# Patient Record
Sex: Male | Born: 1955 | Race: White | Hispanic: No | Marital: Married | Smoking: Never smoker
Health system: Southern US, Community
[De-identification: ages and names within clinical notes are randomized; demographics above are authoritative.]

## PROBLEM LIST (undated history)

## (undated) DIAGNOSIS — G839 Paralytic syndrome, unspecified: Secondary | ICD-10-CM

## (undated) DIAGNOSIS — I639 Cerebral infarction, unspecified: Secondary | ICD-10-CM

## (undated) DIAGNOSIS — T7840XA Allergy, unspecified, initial encounter: Secondary | ICD-10-CM

## (undated) DIAGNOSIS — E079 Disorder of thyroid, unspecified: Secondary | ICD-10-CM

## (undated) DIAGNOSIS — N200 Calculus of kidney: Secondary | ICD-10-CM

## (undated) DIAGNOSIS — J189 Pneumonia, unspecified organism: Secondary | ICD-10-CM

## (undated) DIAGNOSIS — I10 Essential (primary) hypertension: Secondary | ICD-10-CM

## (undated) DIAGNOSIS — E039 Hypothyroidism, unspecified: Secondary | ICD-10-CM

## (undated) DIAGNOSIS — Z87442 Personal history of urinary calculi: Secondary | ICD-10-CM

## (undated) DIAGNOSIS — C801 Malignant (primary) neoplasm, unspecified: Secondary | ICD-10-CM

## (undated) DIAGNOSIS — M1990 Unspecified osteoarthritis, unspecified site: Secondary | ICD-10-CM

## (undated) DIAGNOSIS — I509 Heart failure, unspecified: Secondary | ICD-10-CM

## (undated) DIAGNOSIS — K219 Gastro-esophageal reflux disease without esophagitis: Secondary | ICD-10-CM

## (undated) HISTORY — DX: Essential (primary) hypertension: I10

## (undated) HISTORY — DX: Gastro-esophageal reflux disease without esophagitis: K21.9

## (undated) HISTORY — PX: TONSILLECTOMY: SUR1361

## (undated) HISTORY — DX: Unspecified osteoarthritis, unspecified site: M19.90

## (undated) HISTORY — DX: Disorder of thyroid, unspecified: E07.9

## (undated) HISTORY — DX: Allergy, unspecified, initial encounter: T78.40XA

## (undated) HISTORY — PX: REVERSE SHOULDER ARTHROPLASTY: SHX5054

---
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
## 2000-03-27 HISTORY — PX: CARDIAC CATHETERIZATION: SHX172

## 2000-04-23 ENCOUNTER — Ambulatory Visit (HOSPITAL_COMMUNITY): Admission: RE | Admit: 2000-04-23 | Discharge: 2000-04-23 | Payer: Self-pay | Admitting: *Deleted

## 2004-02-07 ENCOUNTER — Ambulatory Visit: Payer: Self-pay | Admitting: Internal Medicine

## 2004-02-13 ENCOUNTER — Ambulatory Visit: Payer: Self-pay | Admitting: Professional

## 2004-02-27 ENCOUNTER — Ambulatory Visit: Payer: Self-pay | Admitting: Professional

## 2004-03-05 ENCOUNTER — Ambulatory Visit: Payer: Self-pay | Admitting: Professional

## 2004-03-11 ENCOUNTER — Ambulatory Visit: Payer: Self-pay | Admitting: Internal Medicine

## 2004-03-19 ENCOUNTER — Ambulatory Visit: Payer: Self-pay | Admitting: Professional

## 2004-06-13 ENCOUNTER — Ambulatory Visit: Payer: Self-pay | Admitting: Internal Medicine

## 2004-09-16 ENCOUNTER — Ambulatory Visit: Payer: Self-pay | Admitting: Internal Medicine

## 2004-11-06 ENCOUNTER — Ambulatory Visit: Payer: Self-pay | Admitting: Internal Medicine

## 2005-01-01 ENCOUNTER — Encounter: Payer: Self-pay | Admitting: Internal Medicine

## 2005-01-21 ENCOUNTER — Ambulatory Visit: Payer: Self-pay | Admitting: Professional

## 2005-01-28 ENCOUNTER — Ambulatory Visit: Payer: Self-pay | Admitting: Professional

## 2005-02-04 ENCOUNTER — Ambulatory Visit: Payer: Self-pay | Admitting: Professional

## 2005-02-11 ENCOUNTER — Ambulatory Visit: Payer: Self-pay | Admitting: Professional

## 2005-02-25 ENCOUNTER — Ambulatory Visit: Payer: Self-pay | Admitting: Professional

## 2005-03-04 ENCOUNTER — Ambulatory Visit: Payer: Self-pay | Admitting: Professional

## 2005-03-12 ENCOUNTER — Ambulatory Visit: Payer: Self-pay | Admitting: Internal Medicine

## 2005-04-01 ENCOUNTER — Ambulatory Visit: Payer: Self-pay | Admitting: Professional

## 2005-09-09 ENCOUNTER — Ambulatory Visit: Payer: Self-pay | Admitting: Internal Medicine

## 2005-11-21 ENCOUNTER — Ambulatory Visit: Payer: Self-pay | Admitting: Internal Medicine

## 2006-03-26 ENCOUNTER — Ambulatory Visit: Payer: Self-pay | Admitting: Internal Medicine

## 2006-09-17 DIAGNOSIS — I10 Essential (primary) hypertension: Secondary | ICD-10-CM | POA: Insufficient documentation

## 2006-09-17 DIAGNOSIS — K219 Gastro-esophageal reflux disease without esophagitis: Secondary | ICD-10-CM | POA: Insufficient documentation

## 2006-09-17 DIAGNOSIS — J309 Allergic rhinitis, unspecified: Secondary | ICD-10-CM | POA: Insufficient documentation

## 2006-09-24 ENCOUNTER — Ambulatory Visit: Payer: Self-pay | Admitting: Internal Medicine

## 2007-02-11 ENCOUNTER — Telehealth (INDEPENDENT_AMBULATORY_CARE_PROVIDER_SITE_OTHER): Payer: Self-pay | Admitting: *Deleted

## 2007-02-23 ENCOUNTER — Telehealth: Payer: Self-pay | Admitting: Family Medicine

## 2007-02-24 ENCOUNTER — Encounter: Payer: Self-pay | Admitting: Family Medicine

## 2007-02-26 ENCOUNTER — Encounter (INDEPENDENT_AMBULATORY_CARE_PROVIDER_SITE_OTHER): Payer: Self-pay | Admitting: Internal Medicine

## 2007-02-26 ENCOUNTER — Ambulatory Visit: Payer: Self-pay | Admitting: Family Medicine

## 2007-02-26 DIAGNOSIS — M545 Low back pain, unspecified: Secondary | ICD-10-CM | POA: Insufficient documentation

## 2007-03-03 ENCOUNTER — Encounter (INDEPENDENT_AMBULATORY_CARE_PROVIDER_SITE_OTHER): Payer: Self-pay | Admitting: Internal Medicine

## 2007-03-15 ENCOUNTER — Telehealth (INDEPENDENT_AMBULATORY_CARE_PROVIDER_SITE_OTHER): Payer: Self-pay | Admitting: Internal Medicine

## 2007-04-08 ENCOUNTER — Ambulatory Visit: Payer: Self-pay | Admitting: Internal Medicine

## 2007-04-09 ENCOUNTER — Encounter: Payer: Self-pay | Admitting: Internal Medicine

## 2007-04-12 ENCOUNTER — Encounter: Payer: Self-pay | Admitting: Internal Medicine

## 2007-04-12 ENCOUNTER — Ambulatory Visit: Payer: Self-pay | Admitting: Family Medicine

## 2007-04-13 ENCOUNTER — Ambulatory Visit: Payer: Self-pay | Admitting: Internal Medicine

## 2007-04-14 ENCOUNTER — Encounter: Payer: Self-pay | Admitting: Internal Medicine

## 2007-04-28 ENCOUNTER — Ambulatory Visit: Payer: Self-pay | Admitting: Endocrinology

## 2007-05-05 ENCOUNTER — Encounter: Admission: RE | Admit: 2007-05-05 | Discharge: 2007-05-05 | Payer: Self-pay | Admitting: Endocrinology

## 2007-05-12 ENCOUNTER — Telehealth (INDEPENDENT_AMBULATORY_CARE_PROVIDER_SITE_OTHER): Payer: Self-pay | Admitting: *Deleted

## 2007-05-18 ENCOUNTER — Telehealth (INDEPENDENT_AMBULATORY_CARE_PROVIDER_SITE_OTHER): Payer: Self-pay | Admitting: *Deleted

## 2007-05-19 ENCOUNTER — Encounter: Payer: Self-pay | Admitting: Endocrinology

## 2007-05-24 ENCOUNTER — Encounter: Admission: RE | Admit: 2007-05-24 | Discharge: 2007-05-24 | Payer: Self-pay | Admitting: Endocrinology

## 2007-05-25 ENCOUNTER — Encounter: Payer: Self-pay | Admitting: Endocrinology

## 2007-05-25 ENCOUNTER — Telehealth (INDEPENDENT_AMBULATORY_CARE_PROVIDER_SITE_OTHER): Payer: Self-pay | Admitting: *Deleted

## 2007-06-21 ENCOUNTER — Telehealth (INDEPENDENT_AMBULATORY_CARE_PROVIDER_SITE_OTHER): Payer: Self-pay | Admitting: Internal Medicine

## 2007-07-05 ENCOUNTER — Ambulatory Visit: Payer: Self-pay | Admitting: Internal Medicine

## 2007-10-12 ENCOUNTER — Ambulatory Visit: Payer: Self-pay | Admitting: Internal Medicine

## 2007-10-14 ENCOUNTER — Encounter: Payer: Self-pay | Admitting: Internal Medicine

## 2008-04-17 ENCOUNTER — Ambulatory Visit: Payer: Self-pay | Admitting: Internal Medicine

## 2008-04-18 ENCOUNTER — Encounter: Payer: Self-pay | Admitting: Internal Medicine

## 2008-05-05 ENCOUNTER — Ambulatory Visit: Payer: Self-pay | Admitting: Gastroenterology

## 2008-06-02 ENCOUNTER — Ambulatory Visit: Payer: Self-pay | Admitting: Gastroenterology

## 2008-06-19 ENCOUNTER — Ambulatory Visit: Payer: Self-pay | Admitting: Internal Medicine

## 2008-07-03 ENCOUNTER — Telehealth: Payer: Self-pay | Admitting: Internal Medicine

## 2008-09-22 ENCOUNTER — Ambulatory Visit: Payer: Self-pay

## 2008-12-13 ENCOUNTER — Ambulatory Visit: Payer: Self-pay

## 2008-12-19 ENCOUNTER — Ambulatory Visit: Payer: Self-pay | Admitting: Internal Medicine

## 2008-12-19 DIAGNOSIS — M1990 Unspecified osteoarthritis, unspecified site: Secondary | ICD-10-CM | POA: Insufficient documentation

## 2009-02-24 HISTORY — PX: TOTAL HIP ARTHROPLASTY: SHX124

## 2009-02-26 ENCOUNTER — Telehealth: Payer: Self-pay | Admitting: Family Medicine

## 2009-03-05 ENCOUNTER — Encounter: Payer: Self-pay | Admitting: Internal Medicine

## 2009-03-05 ENCOUNTER — Ambulatory Visit: Payer: Self-pay | Admitting: Unknown Physician Specialty

## 2009-03-09 ENCOUNTER — Telehealth: Payer: Self-pay | Admitting: Internal Medicine

## 2009-03-09 ENCOUNTER — Ambulatory Visit: Payer: Self-pay | Admitting: Internal Medicine

## 2009-03-09 DIAGNOSIS — J984 Other disorders of lung: Secondary | ICD-10-CM | POA: Insufficient documentation

## 2009-03-12 ENCOUNTER — Telehealth: Payer: Self-pay | Admitting: Internal Medicine

## 2009-03-12 ENCOUNTER — Encounter: Payer: Self-pay | Admitting: Internal Medicine

## 2009-03-12 ENCOUNTER — Ambulatory Visit: Payer: Self-pay | Admitting: Cardiology

## 2009-03-13 ENCOUNTER — Inpatient Hospital Stay: Payer: Self-pay | Admitting: Unknown Physician Specialty

## 2009-04-03 ENCOUNTER — Telehealth: Payer: Self-pay | Admitting: Internal Medicine

## 2009-06-18 ENCOUNTER — Ambulatory Visit: Payer: Self-pay | Admitting: Internal Medicine

## 2009-06-19 ENCOUNTER — Ambulatory Visit: Payer: Self-pay | Admitting: Internal Medicine

## 2009-06-19 ENCOUNTER — Encounter: Payer: Self-pay | Admitting: Internal Medicine

## 2009-06-19 DIAGNOSIS — E039 Hypothyroidism, unspecified: Secondary | ICD-10-CM | POA: Insufficient documentation

## 2009-06-22 ENCOUNTER — Telehealth: Payer: Self-pay | Admitting: Internal Medicine

## 2009-07-20 ENCOUNTER — Telehealth (INDEPENDENT_AMBULATORY_CARE_PROVIDER_SITE_OTHER): Payer: Self-pay | Admitting: *Deleted

## 2009-08-20 ENCOUNTER — Ambulatory Visit: Payer: Self-pay | Admitting: Internal Medicine

## 2009-08-20 DIAGNOSIS — M25559 Pain in unspecified hip: Secondary | ICD-10-CM | POA: Insufficient documentation

## 2009-09-13 ENCOUNTER — Ambulatory Visit: Payer: Self-pay | Admitting: Family Medicine

## 2009-09-13 DIAGNOSIS — M76899 Other specified enthesopathies of unspecified lower limb, excluding foot: Secondary | ICD-10-CM | POA: Insufficient documentation

## 2009-10-15 ENCOUNTER — Ambulatory Visit: Payer: Self-pay | Admitting: Internal Medicine

## 2009-10-15 DIAGNOSIS — R252 Cramp and spasm: Secondary | ICD-10-CM | POA: Insufficient documentation

## 2009-10-21 ENCOUNTER — Encounter: Payer: Self-pay | Admitting: Internal Medicine

## 2009-12-21 ENCOUNTER — Ambulatory Visit: Payer: Self-pay | Admitting: Internal Medicine

## 2010-03-26 NOTE — Assessment & Plan Note (Signed)
Summary: CPX/DLO   Vital Signs:  Patient profile:   55 year old male Weight:      213 pounds BMI:     32.50 Temp:     98.7 degrees F oral Pulse rate:   76 / minute Pulse rhythm:   regular BP sitting:   160 / 100  (left arm) Cuff size:   large  Vitals Entered By: Mervin Hack CMA Duncan Dull) (June 18, 2009 11:46 AM) CC: adult physical   History of Present Illness: Had right THR in January Went well Back to work last week Very little pain--slightly into back Also has some arthritic changes in back  Recommended to take calcium with vitamin D--through Dr Gerrit Heck discussed that multivitamin would be adequate for him does spend time in sun also  Getting repeat DEXA REasonable to recheck due to the hyperthyroidism If normal again, will not need additional checks  wonders about aspriin for prmary prevention still on mobic for now--may be trying to wean off over time  Allergies: No Known Drug Allergies  Past History:  Past medical, surgical, family and social histories (including risk factors) reviewed for relevance to current acute and chronic problems.  Past Medical History: Reviewed history from 12/19/2008 and no changes required. Allergic rhinitis GERD Hypertension Hyperthyroidism---RAI Rx 3/09 Osteoarthritis  Past Surgical History: Tonsillectomy [facility] Cardiac Cath- negative 02/02 Right THR-- 1/11   Dr Gerrit Heck  Family History: Reviewed history from 04/17/2008 and no changes required. Dad has gout Mom with arrhythmia, HTN No CAD or DM GF died of lung cancer (smoker) no throid disease  Social History: Reviewed history from 09/24/2006 and no changes required. 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 Eyes:  Denies double vision and vision loss-1 eye. ENT:  Denies decreased hearing and  ringing in ears; teeth okay--regular with dentist. CV:  Denies chest pain or discomfort, difficulty breathing at night, difficulty breathing while lying down, fainting, lightheadness, palpitations, and shortness of breath with exertion. Resp:  Denies cough and shortness of breath. GI:  Complains of indigestion; denies abdominal pain, bloody stools, change in bowel habits, dark tarry stools, nausea, and vomiting; minor heartburn. GU:  Denies erectile dysfunction, urinary frequency, and urinary hesitancy. MS:  See HPI; needs right big toe checked--had something fall on it last summer Nail looks like it may be ready to fall off now. Derm:  Complains of lesion(s); denies rash; has some cherry angiomas some brown marks on back that wife has noted--no change. Neuro:  Denies headaches, numbness, tingling, and weakness. Psych:  Denies anxiety and depression. Heme:  Denies abnormal bruising and enlarge lymph nodes. Allergy:  Complains of seasonal allergies and sneezing; okay with OTC meds.  Physical Exam  General:  alert and normal appearance.   Eyes:  pupils equal, pupils round, pupils reactive to light, and no optic disk abnormalities.   Ears:  R ear normal and L ear normal.   Mouth:  no erythema and no lesions.   Neck:  supple, no masses, no thyromegaly, no carotid bruits, and no cervical lymphadenopathy.   Lungs:  normal respiratory effort and normal breath sounds.   Heart:  normal rate, regular rhythm, no murmur, and no gallop.   Abdomen:  soft, non-tender, and no masses.   Rectal:  no hemorrhoids and no masses.   Prostate:  no gland enlargement and no nodules.  Msk:  no joint tenderness and no joint swelling.   Pulses:  1+ in feet Extremities:  no edema Neurologic:  alert & oriented X3, strength normal in all extremities, and gait normal.   Skin:  no rashes and no suspicious lesions.   Axillary Nodes:  No palpable lymphadenopathy Psych:  normally interactive, good eye contact, not  anxious appearing, and not depressed appearing.     Impression & Recommendations:  Problem # 1:  PREVENTIVE HEALTH CARE (ICD-V70.0) Assessment Comment Only due for PSA increasing activity now since hip replacement  Problem # 2:  HYPERTENSION (ICD-401.9) Assessment: Comment Only  control not okay now will try off the mobic and see back may need to add or increase meds  His updated medication list for this problem includes:    Lisinopril-hydrochlorothiazide 20-25 Mg Tabs (Lisinopril-hydrochlorothiazide) .Marland Kitchen... 1 daily  BP today: 160/100 Prior BP: 160/100 (03/09/2009)  Orders: Venipuncture (53664)  Problem # 3:  OSTEOARTHRITIS (ICD-715.90) Assessment: Comment Only will try tylenol now due to increased BP  The following medications were removed from the medication list:    Mobic 7.5 Mg Tabs (Meloxicam) .Marland Kitchen... Take 1 by mouth two times a day    Vicodin 5-500 Mg Tabs (Hydrocodone-acetaminophen) .Marland Kitchen... Pt not sure of strength  Problem # 4:  HYPERTHYROIDISM (ICD-242.90) Assessment: Comment Only had Rx will check labs  Complete Medication List: 1)  Lisinopril-hydrochlorothiazide 20-25 Mg Tabs (Lisinopril-hydrochlorothiazide) .Marland Kitchen.. 1 daily 2)  Ranitidine Hcl 75 Mg Tabs (Ranitidine hcl) .... Take one by mouth two times a day as needed 3)  Cyclobenzaprine Hcl 10 Mg Tabs (Cyclobenzaprine hcl) .... 1/2 -1 two times a day as needed for muscle spasm 4)  Loradamed 10 Mg Tabs (Loratadine) .... Take 1 by mouth once daily 5)  Vitamin C Cr 1000 Mg Cr-tabs (Ascorbic acid) .... Take 1 by mouth once daily 6)  Fiberall Powd (Fiber-vitamins-minerals) .... Once daily 7)  Multivitamins Tabs (Multiple vitamin) .... Take one by mouth once a day  Patient Instructions: 1)  Please stop the mobic 2)  Okay to use tylenol as needed for your back pain 3)  Blood work to [facility] 4)    CBC with diff, urine microal, met C--401.9 5)     TSH, free T4-- 242.90 6)      lipid-- V70.0 7)      PSA--V76.44 8)   Please schedule a follow-up appointment in 1-2  month.   Current Allergies (reviewed today): No known allergies   Appended Document: CPX/DLO

## 2010-03-26 NOTE — Progress Notes (Signed)
Summary: leg is swollen  Phone Note Call from Patient   Caller: Patient Call For: Cindee Salt MD Summary of Call: Pt has swelling in right lower leg and ankle.  Has a splotchy rash on lower leg.  No pain, no itch.  He doesnt understand the sxs, doesnt know why he has the swelling.  Doesnt remember being stung or bitten by anything.  Offered appt today but he declined, says he has to work.  Advised saturday clinic if not better or if worse tomorrow.  He said he would watch and see how he does.  He is using ice and elevates when he is at home. Initial call taken by: Lowella Petties CMA,  June 22, 2009 8:43 AM  Follow-up for Phone Call        noted Please check on him Monday AM Could still add on today if worsens Follow-up by: Cindee Salt MD,  June 22, 2009 8:56 AM  Additional Follow-up for Phone Call Additional follow up Details #1::        left message on machine at home for patient to return my call.  DeShannon Smith CMA Duncan Dull)  Jun 25, 2009 1:23 PM   Spoke with pt, he says he used benedryl cream and rash and swelling went away. Additional Follow-up by: Lowella Petties CMA,  Jun 26, 2009 10:39 AM

## 2010-03-26 NOTE — Progress Notes (Signed)
Summary: Need surgical clearance after CT of chest done today. surgery   Phone Note From Other Clinic Call back at [phone] ext [phone]   Caller: Tonya at Dr. Golda Acre office  Call For: Dr Alphonsus Sias Summary of Call: Tonya with Dr. Golda Acre office just called to see if we have reviewed the CT of chest for pt that was done at 3pm today.. This will determine if has clearance for surgery in AM 01/11/10 at 8:30am. Please advise.  Initial call taken by: Lewanda Rife LPN,  March 12, 2009 5:01 PM  Follow-up for Phone Call        cleared fax the report from CT to them and clearance paper Follow-up by: Cindee Salt MD,  March 12, 2009 5:08 PM  Additional Follow-up for Phone Call Additional follow up Details #1::        Completed clearance paper and CT faxed to Dr. Golda Acre office [phone] and Archie Patten with Dr. Golda Acre office notified.Lewanda Rife LPN  March 12, 2009 5:13 PM

## 2010-03-26 NOTE — Assessment & Plan Note (Signed)
Summary: FOLLOW UP / LFW   Vital Signs:  Patient profile:   55 year old male Weight:      217 pounds Temp:     98.2 degrees F oral Pulse rate:   76 / minute Pulse rhythm:   regular BP sitting:   140 / 80  (left arm) Cuff size:   large  Vitals Entered By: Mervin Hack CMA Duncan Dull) (December 21, 2009 9:10 AM) CC: 4 month follow-up   History of Present Illness: Leg pain is better Still has some chronic issues---acetominophen has helped (650 two times a day) the cyclobenzaprine helps also --- uses reguarly two times a day (1/2 tab)  hasn't checked BP Occ headaches but nothing striking No chest pain  No SOB  allergies have been somewhat active loratadine has been helpful   Allergies: No Known Drug Allergies  Past History:  Past medical, surgical, family and social histories (including risk factors) reviewed for relevance to current acute and chronic problems.  Past Medical History: Reviewed history from 06/19/2009 and no changes required. 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 Tonsillectomy [facility] Radioactive iodine for hyperthyroidism 3/09 Cardiac Cath- negative 02/02 Right THR-- 1/11   Dr Gerrit Heck  Family History: Reviewed history from 04/17/2008 and no changes required. Dad has gout Mom with arrhythmia, HTN No CAD or DM GF died of lung cancer (smoker) no throid disease  Social History: Reviewed history from 09/24/2006 and no changes required. 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   Nose:  mild pale congestion Mouth:  no erythema and no exudates.   Neck:  supple, no masses, and no  thyromegaly.   Lungs:  normal respiratory effort, no intercostal retractions, no accessory muscle use, and normal breath sounds.   Heart:  normal rate, regular rhythm, no murmur, and no gallop.   Msk:  no joint tenderness and no joint swelling.   Extremities:  no edema Psych:  normally interactive, good eye contact, not anxious appearing, and not depressed appearing.     Impression & Recommendations:  Problem # 1:  HYPERTENSION (ICD-401.9) Assessment Unchanged reasonable control no changes  His updated medication list for this problem includes:    Lisinopril-hydrochlorothiazide 20-25 Mg Tabs (Lisinopril-hydrochlorothiazide) .Marland Kitchen... 1 daily  BP today: 140/80 Prior BP: 140/80 (10/15/2009)  Problem # 2:  OSTEOARTHRITIS (ICD-715.90) Assessment: Comment Only doing okay on acetominophen The following medications were removed from the medication list:    Extra Strength Acetaminophen 500 Mg Caps (Acetaminophen) .Marland Kitchen... Take as directed on bottle His updated medication list for this problem includes:    Acetaminophen 325 Mg Tabs (Acetaminophen) .Marland Kitchen... 2 tabs by mouth two times a day for pain  Problem # 3:  LEG CRAMPS, NOCTURNAL (ICD-729.82) Assessment: Comment Only will try the flexeril just at bedtime now  Problem # 4:  GERD (ICD-530.81) Assessment: Unchanged good control  His updated medication list for this problem includes:    Ranitidine Hcl 75 Mg Tabs (Ranitidine hcl) .Marland Kitchen... Take one by mouth two times a day as needed  Complete Medication List: 1)  Lisinopril-hydrochlorothiazide 20-25 Mg Tabs (Lisinopril-hydrochlorothiazide) .Marland Kitchen.. 1 daily 2)  Cyclobenzaprine Hcl 10 Mg Tabs (Cyclobenzaprine hcl) .... 1/2 -1 two times a  day as needed for muscle spasm 3)  Levothyroxine Sodium 50 Mcg Tabs (Levothyroxine sodium) .... Take one by mouth daily 4)  Vitamin C Cr 1000 Mg Cr-tabs (Ascorbic acid) .... Take 1 by mouth once daily 5)  Multivitamins Tabs (Multiple vitamin) .... Take one by mouth  once a day 6)  Ranitidine Hcl 75 Mg Tabs (Ranitidine hcl) .... Take one by mouth two times a day as needed 7)  Loradamed 10 Mg Tabs (Loratadine) .... Take 1-2  by mouth once daily 8)  Acetaminophen 325 Mg Tabs (Acetaminophen) .... 2 tabs by mouth two times a day for pain  Patient Instructions: 1)  Please schedule a follow-up appointment in 6 months for physical   Orders Added: 1)  Est. Patient Level IV [98119]   Immunization History:  Influenza Immunization History:    Influenza:  historical (11/24/2009)   Immunization History:  Influenza Immunization History:    Influenza:  Historical (11/24/2009)  Current Allergies (reviewed today): No known allergies

## 2010-03-26 NOTE — Assessment & Plan Note (Signed)
Summary: RIGHT LEG PAIN   Vital Signs:  Patient profile:   55 year old male Weight:      210 pounds Temp:     98.6 degrees F oral Pulse rate:   64 / minute Pulse rhythm:   regular BP sitting:   140 / 80  (left arm) Cuff size:   large  Vitals Entered By: Mervin Hack CMA Duncan Dull) (October 15, 2009 2:56 PM) CC: right calf pain   History of Present Illness: Saw Dr Patsy Lager about the left hip pain Diagnosed with bursitis better since he had injection  Now with pain in posterior right calf Moslty at night--feels like crammping Occ awakens him "Can't get the leg to relax" Gets spasms in bed also  No injury Works mostly inside in air conditioning No changes in work responsibility  Has tried some heat at night this helps some  Does do some exercise--squats, calf raises, and others (goes back to hip surgery) No sig aerobic work but plans to start  Allergies: No Known Drug Allergies  Past History:  Past medical, surgical, family and social histories (including risk factors) reviewed for relevance to current acute and chronic problems.  Past Medical History: Reviewed history from 06/19/2009 and no changes required. 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 Tonsillectomy [facility] Radioactive iodine for hyperthyroidism 3/09 Cardiac Cath- negative 02/02 Right THR-- 1/11   Dr Gerrit Heck  Family History: Reviewed history from 04/17/2008 and no changes required. Dad has gout Mom with arrhythmia, HTN No CAD or DM GF died of lung cancer (smoker) no throid disease  Social History: Reviewed history from 09/24/2006 and no changes required. 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   Msk:  no joint tenderness and no joint swelling.   No Baker's cysts Extremities:  no edema but right calf is 1.5cm larger in circumference than left Homan's sign is absent  Neurologic:  strength normal in all extremities and gait normal.     Impression & Recommendations:  Problem # 1:  LEG CRAMPS, NOCTURNAL (ICD-729.82) Assessment New  unclear etiology will check potassium and magnesium levels ??some venous insufficiency---will have him use support socks at work increase fluids ?tonic water  Orders: Venipuncture (11914) TLB-Renal Function Panel (80069-RENAL) TLB-Magnesium (Mg) (83735-MG)  Complete Medication List: 1)  Lisinopril-hydrochlorothiazide 20-25 Mg Tabs (Lisinopril-hydrochlorothiazide) .Marland Kitchen.. 1 daily 2)  Cyclobenzaprine Hcl 10 Mg Tabs (Cyclobenzaprine hcl) .... 1/2 -1 two times a day as needed for muscle spasm 3)  Levothyroxine Sodium 50 Mcg Tabs (Levothyroxine sodium) .... Take one by mouth daily 4)  Vitamin C Cr 1000 Mg Cr-tabs (Ascorbic acid) .... Take 1 by mouth once daily 5)  Multivitamins Tabs (Multiple vitamin) .... Take one by mouth once a day 6)  Ranitidine Hcl 75 Mg Tabs (Ranitidine hcl) .... Take one by mouth two times a day as needed 7)  Loradamed 10 Mg Tabs (Loratadine) .... Take 1 by mouth once daily 8)  Extra Strength Acetaminophen 500 Mg Caps (Acetaminophen) .... Take as directed on bottle  Patient Instructions: 1)  Please keep October 28th appt 2)  Please wear support hose to work 3)  Increase fluids during the day---consider tonic water close to bedtime  Current Allergies (reviewed today): No known  allergies

## 2010-03-26 NOTE — Progress Notes (Signed)
Summary: refill request for flexeril  Phone Note Refill Request Message from:  Fax from Pharmacy  Refills Requested: Medication #1:  CYCLOBENZAPRINE HCL 10 MG  TABS 1/2 -1 two times a day as needed for muscle spasm   Last Refilled: 02/26/2009 Faxed request from [facility] [address] is on your desk.  Initial call taken by: Lowella Petties CMA,  April 03, 2009 3:15 PM  Follow-up for Phone Call        Rx completed in Dr. Tiajuana Amass Follow-up by: Cindee Salt MD,  April 03, 2009 5:05 PM    New/Updated Medications: CYCLOBENZAPRINE HCL 10 MG  TABS (CYCLOBENZAPRINE HCL) 1/2 -1 two times a day as needed for muscle spasm Prescriptions: CYCLOBENZAPRINE HCL 10 MG  TABS (CYCLOBENZAPRINE HCL) 1/2 -1 two times a day as needed for muscle spasm  #60 x 0   Entered and Authorized by:   Cindee Salt MD   Signed by:   Cindee Salt MD on 04/03/2009   Method used:   Electronically to        [facility] [address] (retail)       [address]       [place], [place]  [address]       Ph: [phone]       Fax: [phone]   RxID:   [id number]

## 2010-03-26 NOTE — Assessment & Plan Note (Signed)
Summary: SURGICAL CLEARANCE/DS   Vital Signs:  Patient profile:   55 year old male Weight:      218 pounds Temp:     98 degrees F oral Pulse rate:   88 / minute Pulse rhythm:   regular BP sitting:   160 / 100  (left arm) Cuff size:   large  Vitals Entered By: Mervin Hack CMA Duncan Dull) (March 09, 2009 12:41 PM) CC: surgical clearance   History of Present Illness: Asked to see for medical clearance  Dr Gerrit Heck plans THR on right hip next week  EKG was mildly abnormal Has noticed some left shoulder soreness Relates it to possible pulled muscle--has to lift with his arms due to limitations with hip No chest pain  No SOB No palpitations  No sig cough No weight loss No swollen glands  Allergies: No Known Drug Allergies  Past History:  Past medical, surgical, family and social histories (including risk factors) reviewed for relevance to current acute and chronic problems.  Past Medical History: Reviewed history from 12/19/2008 and no changes required. Allergic rhinitis GERD Hypertension Hyperthyroidism---RAI Rx 3/09 Osteoarthritis  Past Surgical History: Reviewed history from 09/17/2006 and no changes required. Tonsillectomy [facility] Cardiac Cath- negative 02/02  Family History: Reviewed history from 04/17/2008 and no changes required. Dad has gout Mom with arrhythmia, HTN No CAD or DM GF died of lung cancer (smoker) no throid disease  Social History: Reviewed history from 09/24/2006 and no changes required. 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   Neck:  supple, no masses, no thyromegaly, no carotid bruits, and no cervical lymphadenopathy.   Lungs:  normal respiratory effort and normal breath sounds.   Heart:  normal rate, regular rhythm, no murmur, and no  gallop.   Abdomen:  soft, non-tender, no hepatomegaly, and no splenomegaly.   Pulses:  normal in feet Extremities:  no edema Psych:  normally interactive, good eye contact, not anxious appearing, and not depressed appearing.     Impression & Recommendations:  Problem # 1:  LUNG NODULE (ICD-518.89) Assessment New no CXR in many years did have pneumonia once many years ago Nodule likely benign Will get CT scan on Monday (3PM)------------will be medically clear for Tuesday's surgery if that is okay  Problem # 2:  ABNORMAL ELECTROCARDIOGRAM (ICD-794.31) Assessment: New  non specific changes Not worrisome for ischemia PE and history benign  No reason to delay surgery because of this finding  Orders: EKG w/ Interpretation (93000)  Complete Medication List: 1)  Multivitamins Tabs (Multiple vitamin) .... Take one by mouth once a day 2)  Xanax 0.25 Mg Tabs (Alprazolam) .... Take one by mouth two times a day as needed 3)  Ranitidine Hcl 75 Mg Tabs (Ranitidine hcl) .... Take one by mouth two times a day as needed 4)  Cyclobenzaprine Hcl 10 Mg Tabs (Cyclobenzaprine hcl) .... 1/2 -1 two times a day as needed for muscle spasm 5)  Vitamin C Cr 1000 Mg Cr-tabs (Ascorbic acid) .... Take 1 by mouth once daily 6)  Lisinopril-hydrochlorothiazide 20-25 Mg Tabs (Lisinopril-hydrochlorothiazide) .Marland Kitchen.. 1 daily 7)  Loradamed 10 Mg Tabs (Loratadine) .... Take 1 by mouth once daily 8)  Fiberall Powd (Fiber-vitamins-minerals) .... Once daily 9)  Mobic 7.5 Mg Tabs (Meloxicam) .... Take 1 by mouth two times a  day 10)  Vicodin 5-500 Mg Tabs (Hydrocodone-acetaminophen) .... Pt not sure of strength  Patient Instructions: 1)  Will keep regular follow up 2)  CT scan of chest on Monday  Current Allergies (reviewed today): No known allergies    EKG  Procedure date:  03/09/2009  Findings:      sinus @80   non specific anterolateral ST-T changes

## 2010-03-26 NOTE — Letter (Signed)
Summary: Surgical Clearance/[facility]  [facility]   Imported By: Lanelle Bal 03/20/2009 08:23:40  _____________________________________________________________________  External Attachment:    Type:   Image     Comment:   External Document

## 2010-03-26 NOTE — Assessment & Plan Note (Signed)
Summary: REFER FRM LETVAK FOR EVAL OF LEFT HIP PAIN / LFW   Vital Signs:  Patient profile:   55 year old Jason [name] Height:      68 inches Weight:      213.0 pounds BMI:     32.50 Temp:     98.1 degrees F oral Pulse rate:   76 / minute Pulse rhythm:   regular BP sitting:   130 / 90  (left arm) Cuff size:   large  Vitals Entered By: Benny Lennert CMA Duncan Dull) (September 13, 2009 9:53 AM)  History of Present Illness: Chief complaint Left hip pain  55 year old Jason [name] seen at the request of Dr. Alphonsus Sias for evaluation of Left hip pain:  Status post right total hip arthroplasty done in 02/2009 by Dr. Jones Broom, and now his right hip is feeling very good, s/p physical therapy for THA. The patient does work on his feet all day at [employer] and thinks he is getting tired some throughout the end of the day and may be altering his gait. Most of his pain is lateral.  WOrks at [employer] in the winter and LM, on the feet all the time.  no groin pain on the left.  No trauma or injury that he can recall. No back pain. No radiculopathy, no numbness or appreciated weakness.   Multiple xrays reviewed with the patient in the office. Most notable for a R hip that prior to THA was severely arthritic with no visible joint space and very large osteophyte and calcification. s/p THA prosthesis appears in anatomical position. L hip has mild arthritic changes only.  Allergies (verified): No Known Drug Allergies  Past History:  Past medical, surgical, family and social histories (including risk factors) reviewed, and no changes noted (except as noted below).  Past Medical History: Reviewed history from 06/19/2009 and no changes required. 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 Tonsillectomy BIH Radioactive iodine for hyperthyroidism 3/09 Cardiac Cath- negative  02/02 Right THR-- 1/11   Dr Gerrit Heck  Family History: Reviewed history from 04/17/2008 and no changes required. Dad has gout Mom with arrhythmia, HTN No CAD or DM GF died of lung cancer (smoker) no throid disease  Social History: Reviewed history from 09/24/2006 and no changes required. 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 MS:  Complains of joint pain; denies low back pain and stiffness. Neuro:  Denies numbness and tingling.  Physical Exam  General:  GEN: Well-developed,well-nourished,in no acute distress; alert,appropriate and cooperative throughout examination HEENT: Normocephalic and atraumatic without obvious abnormalities. No apparent alopecia or balding. Ears, externally no deformities PULM: Breathing comfortably in no respiratory distress EXT: No clubbing, cyanosis, or edema PSYCH: Normally interactive. Cooperative during the interview. Pleasant. Friendly and conversant. Not anxious or depressed appearing. Normal, full affect.  Msk:  HIP EXAM: SIDE: LEFT ROM: Abduction, Flexion, Internal and External range of motion: dramatically inflexibile with mild pain with terminal IROM and EROM GTB: TTP on L SLR: NEG Knees: No effusion FABER: NT REVERSE FABER: very tight but negative Piriformis: NT at direct palpation Str: flexion: 5/5 abduction: 4+/5 adduction: 5/5 Strength testing non-tender  Leg lengths equal  Back NT with nt throughout paraspinus    Impression & Recommendations:  Problem # 1:  TROCHANTERIC BURSITIS, LEFT (ICD-726.5) >30 minutes spent in face to face  time with patient, >50% spent in counselling or coordination of care:  Secondary trochanteric bursitis that I believe is coming from some weakness s/p THA, gait abnormality and primarily fatigue then altered mechanics during the work day.  Usually from fatigue a repetitive trendelenburg  sign / gait can cause GTB, which makes sense in this case. Would expect some weakness after his THA but he will have to work diligently to maximally strengthen hip abductors and core. If he does poorly, I would send him back to formal PT but for now reviewed with him multiple things to help address this and gave a rehab program from [facility].  I do not think primary intrarticular hip or spine process.  cc: Dr. Diego Cory  Trochanteric Bursitis Injection Verbal consent obtained. Risks, benefits, and alternatives reviewed. LEFT greater trochanter sterilely prepped with Betadine. Ethyl Chloride used for anesthesia. 8 cc of Lidocaine 1% injected with 2 cc of 40 mg Kenalog into trochanteric bursa at area of maximal tenderness at greater trochanter. Needle taken to bone to troch bursa, flows easily. Bursa massaged. No bleeding and no complications. Decreased pain after injection. Needle: 22 gauge 1 1/2 in  Orders: Joint Aspirate / Injection, Large (20610) Kenalog 10mg  (4units) (J3301)  Problem # 2:  HIP PAIN, LEFT (ICD-719.45)  His updated medication list for this problem includes:    Cyclobenzaprine Hcl 10 Mg Tabs (Cyclobenzaprine hcl) .Marland Kitchen... 1/2 -1 two times a day as needed for muscle spasm    Extra Strength Acetaminophen 500 Mg Caps (Acetaminophen) .Marland Kitchen... Take as directed on bottle  Orders: Joint Aspirate / Injection, Large (20610) Kenalog 10mg  (4units) (J3301)  Complete Medication List: 1)  Lisinopril-hydrochlorothiazide 20-Jason Mg Tabs (Lisinopril-hydrochlorothiazide) .Marland Kitchen.. 1 daily 2)  Cyclobenzaprine Hcl 10 Mg Tabs (Cyclobenzaprine hcl) .... 1/2 -1 two times a day as needed for muscle spasm 3)  Levothyroxine Sodium 50 Mcg Tabs (Levothyroxine sodium) .... Take one by mouth daily 4)  Vitamin C Cr 1000 Mg Cr-tabs (Ascorbic acid) .... Take 1 by mouth once daily 5)  Multivitamins Tabs (Multiple vitamin) .... Take one by mouth once a day 6)  Ranitidine Hcl 75 Mg Tabs (Ranitidine hcl) .... Take one by  mouth two times a day as needed 7)  Loradamed 10 Mg Tabs (Loratadine) .... Take 1 by mouth once daily 8)  Extra Strength Acetaminophen 500 Mg Caps (Acetaminophen) .... Take as directed on bottle  Current Allergies (reviewed today): No known allergies

## 2010-03-26 NOTE — Assessment & Plan Note (Signed)
Summary: FOLLOW UP / LFW   Vital Signs:  Patient profile:   55 year old male Weight:      210 pounds Temp:     98.3 degrees F oral Pulse rate:   76 / minute Pulse rhythm:   regular BP sitting:   148 / 90  (left arm) Cuff size:   large  Vitals Entered By: Mervin Hack CMA Duncan Dull) (August 20, 2009 10:57 AM)  Serial Vital Signs/Assessments:  Time      Position  BP       Pulse  Resp  Temp     By           R Arm     144/96                         Cindee Salt MD  CC: 2 month follow-up   History of Present Illness: Off the mobic Now starting to have left hip pain didn't have troubles there before Doesn't feel that he is walking differently since surgery has been doing strenthening exercises for right side On his feet all day at work at [employer] has solid shoe with cushioned insole  Doesn't check BP No headaches No chestp pain or SOB no edema  Allergies: No Known Drug Allergies  Past History:  Past medical, surgical, family and social histories (including risk factors) reviewed for relevance to current acute and chronic problems.  Past Medical History: Reviewed history from 06/19/2009 and no changes required. 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 Tonsillectomy BIH Radioactive iodine for hyperthyroidism 3/09 Cardiac Cath- negative 02/02 Right THR-- 1/11   Dr Gerrit Heck  Family History: Reviewed history from 04/17/2008 and no changes required. Dad has gout Mom with arrhythmia, HTN No CAD or DM GF died of lung cancer (smoker) no throid disease  Social History: Reviewed history from 09/24/2006 and no changes required. 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         sleeping  better now--initial problems after the surgery  Physical Exam  General:  alert and normal appearance.   Neck:  supple, no masses, no thyromegaly, no carotid bruits, and no cervical lymphadenopathy.   Lungs:  normal respiratory effort and normal breath sounds.   Heart:  normal rate, regular rhythm, no murmur, and no gallop.   Msk:  no left hip tenderness mild decrease in internal rotation but otherwise normal ROM no apparent synovitis   Impression & Recommendations:  Problem # 1:  HYPERTENSION (ICD-401.9) Assessment Improved better----?related to being off mobic? he didn't take his meds either of the last 2 visits since he was fasting---could overstate the issue then discussed he should take his meds  His updated medication list for this problem includes:    Lisinopril-hydrochlorothiazide 20-25 Mg Tabs (Lisinopril-hydrochlorothiazide) .Marland Kitchen... 1 daily  BP today: 148/90 Prior BP: 160/100 (06/18/2009)  Problem # 2:  HIP PAIN, LEFT (ICD-719.45) Assessment: New past x-ray didn't show arthritis may be mechanical  will set up appt with Dr Patsy Lager  Problem # 3:  HYPOTHYROIDISM (ICD-244.9) Assessment: Comment Only  needs labs on replacement now  His updated medication list for this problem includes:    Levothyroxine Sodium 50 Mcg Tabs (Levothyroxine sodium) .Marland Kitchen... Take one by mouth daily  Orders: TLB-TSH (  Thyroid Stimulating Hormone) (84443-TSH) Venipuncture ([address]) TLB-T4 (Thyrox), Free [phone])  Complete Medication List: 1)  Lisinopril-hydrochlorothiazide 20-25 Mg Tabs (Lisinopril-hydrochlorothiazide) .Marland Kitchen.. 1 daily 2)  Cyclobenzaprine Hcl 10 Mg Tabs (Cyclobenzaprine hcl) .... 1/2 -1 two times a day as needed for muscle spasm 3)  Levothyroxine Sodium 50 Mcg Tabs (Levothyroxine sodium) .... Take one by mouth daily 4)  Vitamin C Cr 1000 Mg Cr-tabs (Ascorbic acid) .... Take 1 by mouth once daily 5)  Multivitamins Tabs (Multiple vitamin) .... Take one by mouth once a day 6)   Ranitidine Hcl 75 Mg Tabs (Ranitidine hcl) .... Take one by mouth two times a day as needed 7)  Loradamed 10 Mg Tabs (Loratadine) .... Take 1 by mouth once daily  Patient Instructions: 1)  Please set up appt with Dr Patsy Lager to evaluate left hip pain 2)  Please schedule a follow-up appointment in 4 months .   Current Allergies (reviewed today): No known allergies

## 2010-03-26 NOTE — Progress Notes (Signed)
Summary: Need Clearance  Phone Note From Other Clinic   Caller: Tonya from Dr. Golda Acre office @ [facility] [phone] ext [phone] Call For: Dr. Alphonsus Sias Summary of Call: pt needs to have appt for surgical clearance, pt is schedule for surgery on tuesday. Form on your desk from Dr. Golda Acre office. Initial call taken by: Mervin Hack CMA Duncan Dull),  March 09, 2009 10:26 AM  Follow-up for Phone Call        will see today and make arrangements Follow-up by: Cindee Salt MD,  March 09, 2009 11:14 AM  Additional Follow-up for Phone Call Additional follow up Details #1::        CT Scan of chest set up for 03/12/2009 at 3:00 at [facility]. Call report requested. Additional Follow-up by: Carlton Adam,  March 09, 2009 3:29 PM  New Problems: LUNG NODULE (ICD-518.89)   New Problems: LUNG NODULE (ICD-518.89)

## 2010-03-26 NOTE — Miscellaneous (Signed)
Summary: BONE DENSITY  Clinical Lists Changes  Orders: Added new Test order of T-Bone Densitometry (77080) - Signed Added new Test order of T-Lumbar Vertebral Assessment (77082) - Signed 

## 2010-03-26 NOTE — Progress Notes (Signed)
Summary: Cyclobenzaprine  Phone Note Refill Request Message from:  [employer] on Jul 20, 2009 10:01 AM  Refills Requested: Medication #1:  CYCLOBENZAPRINE HCL 10 MG  TABS 1/2 -1 two times a day as needed for muscle spasm [facility] [address]   Last Fill Date:  05/01/2009   Pharmacy Phone:  [phone]   Method Requested: Telephone to Pharmacy Initial call taken by: Delilah Shan CMA (AAMA),  Jul 20, 2009 10:01 AM    New/Updated Medications: CYCLOBENZAPRINE HCL 10 MG  TABS (CYCLOBENZAPRINE HCL) 1/2 -1 two times a day as needed for muscle spasm Prescriptions: CYCLOBENZAPRINE HCL 10 MG  TABS (CYCLOBENZAPRINE HCL) 1/2 -1 two times a day as needed for muscle spasm  #60 x 1   Entered and Authorized by:   Cindee Salt MD   Signed by:   Cindee Salt MD on 07/20/2009   Method used:   Electronically to        [facility] [address] (retail)       [address], [address]       [place], [place]  [address]       Ph: [phone]       Fax: [phone]   RxID:   [id number]

## 2010-03-26 NOTE — Progress Notes (Signed)
Summary: Rx Cyclobenzaprine   Phone Note Refill Request Call back at [phone] Message from:  [facility] [address] on February 26, 2009 10:24 AM  Refills Requested: Medication #1:  CYCLOBENZAPRINE HCL 10 MG  TABS 1/2 -1 two times a day as needed for muscle spasm   Last Refilled: 12/14/2008 Received e-scribe refill request from pharmacy. Please advise   Method Requested: Electronic Initial call taken by: Linde Gillis CMA Duncan Dull),  February 26, 2009 10:26 AM    Prescriptions: CYCLOBENZAPRINE HCL 10 MG  TABS (CYCLOBENZAPRINE HCL) 1/2 -1 two times a day as needed for muscle spasm  #60 Each x 0   Entered and Authorized by:   Ruthe Mannan MD   Signed by:   Ruthe Mannan MD on 02/26/2009   Method used:   Faxed to ...       [facility] [address] (retail)       [address]       [place], [place]  [address]       Ph: [phone]       Fax: [phone]   RxID:   [id number]

## 2010-03-29 ENCOUNTER — Telehealth: Payer: Self-pay | Admitting: Family Medicine

## 2010-04-03 NOTE — Progress Notes (Signed)
Summary: refill request for flexeril  Phone Note Refill Request Message from:  Fax from Pharmacy  Refills Requested: Medication #1:  CYCLOBENZAPRINE HCL 10 MG  TABS 1/2 -1 two times a day as needed for muscle spasm   Last Refilled: 12/11/2009 [facility]ed [facility] [address]  Initial call taken by: Lowella Petties CMA, AAMA,  March 29, 2010 8:59 AM    Prescriptions: CYCLOBENZAPRINE HCL 10 MG  TABS (CYCLOBENZAPRINE HCL) 1/2 -1 two times a day as needed for muscle spasm  #60 Each x 0   Entered and Authorized by:   Ruthe Mannan MD   Signed by:   Ruthe Mannan MD on 03/29/2010   Method used:   Electronically to        [facility] [address] (retail)       [address], [address]       [place], [place]  [address]       Ph: [phone]       Fax: [phone]   RxID:   [id number]

## 2010-05-09 ENCOUNTER — Encounter: Payer: Self-pay | Admitting: Internal Medicine

## 2010-06-14 ENCOUNTER — Other Ambulatory Visit: Payer: Self-pay | Admitting: Internal Medicine

## 2010-06-18 ENCOUNTER — Other Ambulatory Visit: Payer: Self-pay | Admitting: *Deleted

## 2010-06-18 MED ORDER — CYCLOBENZAPRINE HCL 10 MG PO TABS: ORAL_TABLET | ORAL | Status: DC

## 2010-06-18 NOTE — Telephone Encounter (Signed)
Okay #60 x 1 

## 2010-06-24 ENCOUNTER — Encounter: Payer: Self-pay | Admitting: Internal Medicine

## 2010-06-24 ENCOUNTER — Ambulatory Visit (INDEPENDENT_AMBULATORY_CARE_PROVIDER_SITE_OTHER): Payer: BC Managed Care – PPO | Admitting: Internal Medicine

## 2010-06-24 VITALS — BP 130/80 | HR 70 | Temp 98.8°F | Ht 70.0 in | Wt 206.0 lb

## 2010-06-24 DIAGNOSIS — J309 Allergic rhinitis, unspecified: Secondary | ICD-10-CM

## 2010-06-24 DIAGNOSIS — E039 Hypothyroidism, unspecified: Secondary | ICD-10-CM

## 2010-06-24 DIAGNOSIS — Z125 Encounter for screening for malignant neoplasm of prostate: Secondary | ICD-10-CM

## 2010-06-24 DIAGNOSIS — M545 Low back pain, unspecified: Secondary | ICD-10-CM

## 2010-06-24 DIAGNOSIS — I10 Essential (primary) hypertension: Secondary | ICD-10-CM

## 2010-06-24 DIAGNOSIS — N529 Male erectile dysfunction, unspecified: Secondary | ICD-10-CM | POA: Insufficient documentation

## 2010-06-24 DIAGNOSIS — Z0000 Encounter for general adult medical examination without abnormal findings: Secondary | ICD-10-CM | POA: Insufficient documentation

## 2010-06-24 DIAGNOSIS — K219 Gastro-esophageal reflux disease without esophagitis: Secondary | ICD-10-CM

## 2010-06-24 NOTE — Progress Notes (Signed)
Subjective:    Patient ID: Jason [name], male    DOB: 1955/10/11, 55 y.o.   MRN: [id number]  HPI Doing fine Reviewed chest CT---low risk and never a smoker so follow up not indicated  Work has been fine No problems there  Discussed PSA for prostate cancer screening  Still gets occ back pain Not limiting at this point  Stomach has been fine Rarely needs the med  Allergies controlled with loratadine No sig issues  Still on BP meds  Current outpatient prescriptions:acetaminophen (TYLENOL) 325 MG tablet, Take 650 mg by mouth 2 (two) times daily.  , Disp: , Rfl: ;  Ascorbic Acid (VITAMIN C) 1000 MG tablet, Take 1,000 mg by mouth daily.  , Disp: , Rfl: ;  cyclobenzaprine (FLEXERIL) 10 MG tablet, 1/2 to 1 tablet 2 times daily as needed for muscle spasm, Disp: 30 tablet, Rfl: 0 levothyroxine (SYNTHROID, LEVOTHROID) 50 MCG tablet, TAKE ONE TABLET BY MOUTH EVERY DAY, Disp: 30 tablet, Rfl: 11;  lisinopril-hydrochlorothiazide (PRINZIDE,ZESTORETIC) 20-25 MG per tablet, Take 1 tablet by mouth daily.  , Disp: , Rfl: ;  loratadine (CLARITIN) 10 MG tablet, Take 10 mg by mouth daily. Can take 2 tabs if needed , Disp: , Rfl: ;  Multiple Vitamin (MULTIVITAMIN) tablet, Take 1 tablet by mouth daily.  , Disp: , Rfl:  ranitidine (ZANTAC) 75 MG tablet, Take 75 mg by mouth 2 (two) times daily.  , Disp: , Rfl:   Past Medical History  Diagnosis Date  . Allergy   . GERD (gastroesophageal reflux disease)   . Hypertension   . Arthritis     osteoarthritis  . Thyroid disease     hypo    Past Surgical History  Procedure Date  . Tonsillectomy   . Cardiac catheterization 03-2000  . Total hip arthroplasty 02-24-2009    Family History  Problem Relation Age of Onset  . Gout Father   . Arrhythmia Mother   . Hypertension Mother     History   Social History  . Marital Status: Married    Spouse Name: N/A    Number of Children: 0  . Years of Education: N/A   Occupational History  .  [employer]    Social History Main Topics  . Smoking status: Never Smoker   . Smokeless tobacco: Not on file  . Alcohol Use: Yes  . Drug Use: No  . Sexually Active: Not on file   Other Topics Concern  . Not on file   Social History Narrative  . No narrative on file   Review of Systems  Constitutional: Negative for fever and fatigue.       Weight down ~10# Gave up sugared sodas Increased exercise regimen---freq 2 miles walks now (20 minute mile) Wears seat belt  HENT: Positive for congestion and rhinorrhea. Negative for hearing loss, dental problem and tinnitus.        Regular with dentist  Eyes: Negative for visual disturbance.       No diplopia or focal vision loss  Respiratory: Negative for cough, chest tightness and shortness of breath.   Cardiovascular: Negative for chest pain, palpitations and leg swelling.  Gastrointestinal: Negative for nausea, vomiting, abdominal pain, constipation and blood in stool.  Genitourinary: Negative for dysuria, urgency, decreased urine volume and difficulty urinating.       Some ED-- can't keep erection   Musculoskeletal: Positive for back pain and arthralgias. Negative for joint swelling.  Skin: Negative for rash.  No suspicious lesions  Neurological: Negative for dizziness, syncope, weakness, light-headedness, numbness and headaches.  Hematological: Negative for adenopathy. Does not bruise/bleed easily.  Psychiatric/Behavioral: Negative for sleep disturbance and dysphoric mood. The patient is not nervous/anxious.        Objective:   Physical Exam  Constitutional: He appears well-developed and well-nourished. No distress.  HENT:  Head: Normocephalic and atraumatic.  Right Ear: External ear normal.  Left Ear: External ear normal.  Mouth/Throat: Oropharynx is clear and moist. No oropharyngeal exudate.       Mild scarring left TM  Eyes: Conjunctivae and EOM are normal. Pupils are equal, round, and reactive to light.       Fundi benign   Neck: Normal range of motion. Neck supple. No thyromegaly present.  Cardiovascular: Normal rate, regular rhythm, normal heart sounds and intact distal pulses.  Exam reveals no gallop.   No murmur heard. Pulmonary/Chest: Effort normal and breath sounds normal. No respiratory distress. He has no wheezes. He has no rales.  Abdominal: Soft. He exhibits no mass. There is no tenderness.  Musculoskeletal: Normal range of motion. He exhibits no edema and no tenderness.  Lymphadenopathy:    He has no cervical adenopathy.  Neurological: He is alert. He exhibits normal muscle tone.       No weakness Normal gait   Skin: Skin is warm. No rash noted.  Psychiatric: He has a normal mood and affect. His behavior is normal. Judgment and thought content normal.          Assessment & Plan:

## 2010-06-24 NOTE — Progress Notes (Signed)
Addended by: Liane Comber on: 06/24/2010 12:55 PM   Modules accepted: Orders

## 2010-06-25 ENCOUNTER — Other Ambulatory Visit: Payer: Self-pay | Admitting: Family Medicine

## 2010-06-28 ENCOUNTER — Encounter: Payer: Self-pay | Admitting: Internal Medicine

## 2010-07-12 NOTE — Cardiovascular Report (Signed)
[facility]  Patient:    Jason [name], Jason [name]                     MRN: [id number] Adm. Date:  [id number] Disc. Date: [id number] Attending:  Alric Quan CC:         Hans Eden, M.D.  Cardiac Catheterization Lab   Cardiac Catheterization  PROCEDURE: 1. Selective coronary angiography. 2. Left ventricular angiography. 3. Abdominal aortography using Judkins technique.  CARDIOLOGIST:  Cecil Cranker, M.D. [facility]  INDICATIONS:  The patient is a 55 year old white married male with risk factors but no cigarettes.  He presented with chest and left arm pain to the ER at [facility] over the weekend.  Subsequent Cardiolite revealed anteroseptal ischemia.  RESULTS: Pressures: The LV systolic 141/17, AO systolic 141/ ______ .  ANGIOGRAPHY: 1. Left main coronary was normal. 2. The left anterior descending artery was normal. 3. The circumflex coronary artery was normal. 4. Right coronary artery was normal. 5. The left ventricle was normal. 6. Abdominal aortogram reveals normal renal arteries, normal abdominal aorta,    somewhat tortuous iliacs.  SUMMARY: 1. Normal selective coronary angiography. 2. Normal left ventricular angiography. 3. Normal renal arteries and abdominal aorta with tortuous iliacs. DD:  04/23/00 TD:  04/24/00 Job: [id number]

## 2010-08-15 ENCOUNTER — Other Ambulatory Visit: Payer: Self-pay | Admitting: Internal Medicine

## 2010-08-21 ENCOUNTER — Emergency Department: Payer: Self-pay | Admitting: Emergency Medicine

## 2010-08-26 ENCOUNTER — Telehealth: Payer: Self-pay | Admitting: *Deleted

## 2010-08-26 NOTE — Telephone Encounter (Signed)
Spoke with Wife, she will discuss with Husband when she gets home this evening and find out what time would be best for him and she will call back to schedule appt.

## 2010-08-26 NOTE — Telephone Encounter (Signed)
ER notes are on your desk. 

## 2010-08-26 NOTE — Telephone Encounter (Signed)
Patient was hurt at work 5 days ago. He went to Urgent Care and their xray machine was down so they sent him to the ER. He had torn ligament in his shoulder and was told to wear a sling x 5 days and was given 5 days of medication. Today is his last day for both and he is still in some pain. 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 Wife is concerned because he wasn't told to have an ER follow up. She is asking if you could review the notes from the ER( I have already sent request for ER notes) and let her know if she should schedule him an appt or give it more time. She says that she doesn't want to bring him in for appt if he doesn't need one.

## 2010-08-26 NOTE — Telephone Encounter (Signed)
  Since it is a work related injury, this would be worker's comp and he probably needs an evaluation before returning to work. He should check with [employer] to see if they recommend any particular doctor. If they don't have a worker's comp doctor, I would recommend an appt with Dr Patsy Lager to do a thorough assessment of his shoulder and see how it is

## 2010-08-27 ENCOUNTER — Telehealth: Payer: Self-pay | Admitting: *Deleted

## 2010-08-27 NOTE — Telephone Encounter (Signed)
That is fine It should be handled through their worker's comp

## 2010-08-27 NOTE — Telephone Encounter (Signed)
Patient called back this morning and says that he doesn't want to schedule an appt yet because it is a [employer] comp case and he is waiting to hear back from human resources to make sure it is okay that he is seen in our office. He will call back once he hears from her.

## 2010-09-27 ENCOUNTER — Other Ambulatory Visit: Payer: Self-pay | Admitting: Family Medicine

## 2010-09-27 ENCOUNTER — Encounter: Payer: Self-pay | Admitting: Family Medicine

## 2010-09-27 ENCOUNTER — Ambulatory Visit (INDEPENDENT_AMBULATORY_CARE_PROVIDER_SITE_OTHER): Payer: BC Managed Care – PPO | Admitting: Family Medicine

## 2010-09-27 DIAGNOSIS — M79673 Pain in unspecified foot: Secondary | ICD-10-CM | POA: Insufficient documentation

## 2010-09-27 DIAGNOSIS — M79609 Pain in unspecified limb: Secondary | ICD-10-CM

## 2010-09-27 NOTE — Patient Instructions (Addendum)
Sounds like plantar fasciitis. Treat with stretching exercises (key), ice water baths, heel cushion insert for shoes.  Handout provided. Continue tylenol.  May use ibuprofen sparingly with food, keep eye on blood pressure.  Start with 2 pills of ibuprofen 200mg  twice daily for 3-5 days then as needed. For wart - may continue compound W.  I don't think the pain is coming from there. Call us if not improving as expected or any worsening.

## 2010-09-27 NOTE — Assessment & Plan Note (Signed)
L side, anticipate plantar fasciitis. Discussed care.  Heel cushion, stretching exercises, ice water bath. NSAID use limited 2/2 HTN - previously elevated bp. Use ibuprofen sparingly, monitor BP. Plantar wart, however don't think this is main contributor to heel pain but rather plantar fasciitis.  Continue compound W for this. Update [place] if not improving as expected.  Consider podiatry referral.

## 2010-09-27 NOTE — Progress Notes (Signed)
  Subjective:    Patient ID: Jason [name], male    DOB: [birth year], 55 y.o.   MRN: [id number]  HPI CC: heel pain  4d h/o L heel pain.  Noticed wart on heel.  Started using wart medicine (compound W liquid with bandaid over it).  Heel swollen.  Stayed out of work yesterday, kept leg elevated.  Also using ice water to keep swelling down.  Today some better, still pain with walking and slight limp.  Pain mainly at sole of foot, sometimes pain going into arch.  Worse with first step in am.  No pain with foot up.  never had anything like this before.  No fevers/chills, other joint pains, rash, denies injury to heel.  Tries to do 2 mi walk 2-3x/wk.  Did walk wednesday morning.  Review of Systems Per HPI    Objective:   Physical Exam  Nursing note and vitals reviewed. Constitutional: He appears well-developed and well-nourished. No distress.  Musculoskeletal: Normal range of motion. He exhibits no edema.       Left lateral heel with plantar wart but not tender to palpation. Tender to palpation plantar fascia at heel and arch. FROM at ankle. Intact longitudinal arches  Skin: Skin is warm and dry. No rash noted.          Assessment & Plan:

## 2010-10-28 ENCOUNTER — Other Ambulatory Visit: Payer: Self-pay | Admitting: Family Medicine

## 2010-10-29 NOTE — Telephone Encounter (Signed)
Rx done electronically 

## 2010-11-04 ENCOUNTER — Telehealth: Payer: Self-pay | Admitting: *Deleted

## 2010-11-04 NOTE — Telephone Encounter (Signed)
Rx written Can fax to [facility] for him to get it there

## 2010-11-04 NOTE — Telephone Encounter (Signed)
LDL, HDL, CBC Panel, Total Chol, HGB A1C, TCHDL ratio, triglycerides, and also a lab to test blood/saliva for smoking cotinine screen oral fluid and the test number for [facility] is [id number]; questioning collection if it can be done at patient service center.  Please call wife, Delray Alt,  at [phone] and she will be there until 5:30 p.m.

## 2010-11-04 NOTE — Telephone Encounter (Signed)
Spoke with wife and advised results, rx faxed to wife @ [phone] for patient.

## 2010-11-04 NOTE — Telephone Encounter (Signed)
Pt needs to have lab work done for his [employer].  This is mostly cholesterol, blood sugar, but they do want to do a blood nicotine test to test for smoking.  The insurance company had sent pt a kit for all this, but he couldn't get enough blood on the test strip that was sent, and they wont send another, they said pt would need to see his doctor to have these labs done.  Pt doesn't have a complete list of what is required, his wife does, but he said the only thing unusual is the nicotine test.  Please advise on whether pt can get labs here.

## 2010-11-07 ENCOUNTER — Telehealth: Payer: Self-pay | Admitting: *Deleted

## 2010-11-07 NOTE — Telephone Encounter (Signed)
Note continued from below, form is on your desk.

## 2010-11-07 NOTE — Telephone Encounter (Signed)
Pt's wife has faxed a form for pt's insurance that needs to be completed with pt's lab results.  Form will then need to be faxed to insurance company at number [phone].

## 2010-11-07 NOTE — Telephone Encounter (Signed)
Please fill in form when labs come from [facility] then back for my signature

## 2010-11-11 ENCOUNTER — Encounter: Payer: Self-pay | Admitting: Internal Medicine

## 2010-11-19 ENCOUNTER — Ambulatory Visit: Payer: Self-pay | Admitting: Specialist

## 2010-11-20 ENCOUNTER — Telehealth: Payer: Self-pay | Admitting: *Deleted

## 2010-11-20 NOTE — Telephone Encounter (Signed)
Message copied by Sueanne Margarita on Wed Nov 20, 2010 10:33 AM ------      Message from: Tillman Abide I      Created: Mon Nov 18, 2010  5:36 PM       Please call him      Blood work is all fine      Chol levels are good with total of 144 and LDL or bad chol of 85      Sugar level and blood count normal      No nicotine found            Send him a copy

## 2010-11-20 NOTE — Telephone Encounter (Signed)
.  left message to have patient return my call.  

## 2010-11-21 NOTE — Telephone Encounter (Signed)
Spoke with patient's wife and advised results  

## 2010-11-25 HISTORY — PX: ROTATOR CUFF REPAIR: SHX139

## 2010-11-28 ENCOUNTER — Ambulatory Visit: Payer: Self-pay | Admitting: Specialist

## 2010-12-24 ENCOUNTER — Encounter: Payer: Self-pay | Admitting: Internal Medicine

## 2010-12-24 ENCOUNTER — Ambulatory Visit (INDEPENDENT_AMBULATORY_CARE_PROVIDER_SITE_OTHER): Payer: BC Managed Care – PPO | Admitting: Internal Medicine

## 2010-12-24 VITALS — BP 148/88 | HR 62 | Temp 98.3°F | Ht 70.0 in | Wt 210.0 lb

## 2010-12-24 DIAGNOSIS — K219 Gastro-esophageal reflux disease without esophagitis: Secondary | ICD-10-CM

## 2010-12-24 DIAGNOSIS — I10 Essential (primary) hypertension: Secondary | ICD-10-CM

## 2010-12-24 DIAGNOSIS — M722 Plantar fascial fibromatosis: Secondary | ICD-10-CM

## 2010-12-24 NOTE — Assessment & Plan Note (Signed)
BP Readings from Last 3 Encounters:  12/24/10 148/88  09/27/10 146/84  06/24/10 130/80   Up some due to mobic No changes in meds

## 2010-12-24 NOTE — Assessment & Plan Note (Signed)
Has been quiet May be due to more regular eating and less stress being out of work

## 2010-12-24 NOTE — Assessment & Plan Note (Signed)
Better now Did get better arch support but may be better due to being out of work Asked him to start walking to be sure it didn't flare the heel pain again

## 2010-12-24 NOTE — Progress Notes (Signed)
Subjective:    Patient ID: Jason [name], male    DOB: 10/05/1955, 55 y.o.   MRN: [id number]  HPI Had right rotator cuff repair ~3 weeks ago Still in sling Using TENS Still using hydrocodone regularly  Saw Dr Reece Agar for plantar fasciitis Now better since he is out of work since the surgery Does feel extra arch support and exercises helped  Otherwise doing okay NO problems with BP during surgery Feels his BP is up some now due to taking mobic (it has raised BP in past) No headaches No chest pain or SOB  Heartburn is quiet Rarely needs the ranitidine lately ??related to not working also?  Still with ED Wife having some issues also and with his surgery it hasn't been an issue for now  Current Outpatient Prescriptions on File Prior to Visit  Medication Sig Dispense Refill  . acetaminophen (TYLENOL) 325 MG tablet Take 650 mg by mouth 2 (two) times daily.        . Ascorbic Acid (VITAMIN C) 1000 MG tablet Take 1,000 mg by mouth daily.        . cyclobenzaprine (FLEXERIL) 10 MG tablet TAKE ONE-HALF TO ONE TABLET BY MOUTH TWICE DAILY AS NEEDED FOR MUSCLE SPASM  60 tablet  0  . levothyroxine (SYNTHROID, LEVOTHROID) 50 MCG tablet TAKE ONE TABLET BY MOUTH EVERY DAY  30 tablet  11  . lisinopril-hydrochlorothiazide (PRINZIDE,ZESTORETIC) 20-25 MG per tablet TAKE ONE TABLET BY MOUTH EVERY DAY  90 tablet  3  . loratadine (CLARITIN) 10 MG tablet Take 10 mg by mouth daily. Can take 2 tabs if needed       . Multiple Vitamin (MULTIVITAMIN) tablet Take 1 tablet by mouth daily.        . ranitidine (ZANTAC) 75 MG tablet Take 75 mg by mouth 2 (two) times daily.          No Known Allergies  Past Medical History  Diagnosis Date  . Allergy   . GERD (gastroesophageal reflux disease)   . Hypertension   . Arthritis     osteoarthritis  . Thyroid disease     hypo    Past Surgical History  Procedure Date  . Tonsillectomy   . Cardiac catheterization 03-2000  . 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  Rotator cuff repair 10/12    Dr Hyacinth Meeker    Family History  Problem Relation Age of Onset  . Gout Father   . Arrhythmia Mother   . Hypertension Mother     History   Social History  . Marital Status: Married    Spouse Name: N/A    Number of Children: 0  . Years of Education: N/A   Occupational History  .  [employer]   Social History Main Topics  . Smoking status: Never Smoker   . Smokeless tobacco: Never Used  . Alcohol Use: Yes  . Drug Use: No  . Sexually Active: Not on file   Other Topics Concern  . Not on file   Social History Narrative  . No narrative on file   Review of Systems Appetite is fine Weight is stable Trying to ride recumbent stationery bike regularly     Objective:   Physical Exam  Constitutional: He appears well-developed and well-nourished. No distress.  Neck: Normal range of motion. Neck supple. No thyromegaly present.  Cardiovascular: Normal rate, regular rhythm, normal heart sounds and intact distal pulses.  Exam reveals no gallop.   No murmur heard. Pulmonary/Chest: Effort normal and breath  sounds normal. No respiratory distress. He has no wheezes. He has no rales.  Abdominal: Soft. There is no tenderness.  Musculoskeletal: He exhibits no edema.       Right shoulder in sling  Lymphadenopathy:    He has no cervical adenopathy.  Psychiatric: He has a normal mood and affect. His behavior is normal. Judgment and thought content normal.          Assessment & Plan:

## 2010-12-25 ENCOUNTER — Other Ambulatory Visit: Payer: Self-pay | Admitting: Internal Medicine

## 2010-12-25 NOTE — Telephone Encounter (Signed)
rx sent to pharmacy by e-script  

## 2011-02-25 ENCOUNTER — Other Ambulatory Visit: Payer: Self-pay | Admitting: Internal Medicine

## 2011-03-12 ENCOUNTER — Ambulatory Visit (INDEPENDENT_AMBULATORY_CARE_PROVIDER_SITE_OTHER): Payer: BC Managed Care – PPO | Admitting: Family Medicine

## 2011-03-12 ENCOUNTER — Encounter: Payer: Self-pay | Admitting: Family Medicine

## 2011-03-12 ENCOUNTER — Telehealth: Payer: Self-pay | Admitting: Internal Medicine

## 2011-03-12 VITALS — BP 130/70 | HR 82 | Temp 98.4°F | Ht 70.0 in | Wt 209.0 lb

## 2011-03-12 DIAGNOSIS — J320 Chronic maxillary sinusitis: Secondary | ICD-10-CM

## 2011-03-12 MED ORDER — AMOXICILLIN-POT CLAVULANATE 875-125 MG PO TABS: 1.0000 | ORAL_TABLET | Freq: Two times a day (BID) | ORAL | Status: AC

## 2011-03-12 NOTE — Telephone Encounter (Signed)
Triage Record Num: [id number] Operator: Geanie Berlin Patient Name: Jason [name] Call Date & Time: 03/12/2011 10:02:54AM Patient Phone: [phone] PCP: Tillman Abide Patient Gender: Male PCP Fax : [phone] Patient DOB: 05/26/1955 Practice Name: [facility] Reason for Call: Caller: Dan/Patient; PCP: Tillman Abide I.; CB#: [phone]; Call regarding Cold Symptoms; Concerned about terrible headache with pressure in ear, behind eyes and nose. Dizziness present. Onset 03/08/11. Unable to read thermometer. Chills and hot flashes. Advised to see MD within 4 hrs d/t office is open for moderate to severe headache unrelieved by otc medicaons per URI Guideline. Appt scheduled for 1145 03/12/11 with Dr Dallas Schimke at [facility]. Protocol(s) Used: Upper Respiratory Infection (URI) Recommended Outcome per Protocol: Call Provider Immediately Reason for Outcome: Moderate to severe headache unrelieved by nonprescription medication Care Advice: ~ Use a cool mist humidifier to moisten air. Be sure to clean according to manufacturer's instructions. ~ IMMEDIATE ACTION A warm, moist compress placed on face, over eyes for 15 to 20 minutes, 5 to 6 times a day, may help relieve the congestion. ~ Most adults need to drink 6-10 eight-ounce glasses (1.2-2.0 liters) of fluids per day unless previously told to limit fluid intake for other medical reasons. Limit fluids that contain caffeine, sugar or alcohol. Urine will be a very light yellow color when you drink enough fluids. ~ Analgesic/Antipyretic Advice - Acetaminophen: Consider acetaminophen as directed on label or by pharmacist/provider for pain or fever PRECAUTIONS: - Use if there is no history of liver disease, alcoholism, or intake of three or more alcohol drinks per day - Only if approved by provider during pregnancy or when breastfeeding - During pregnancy, acetaminophen should not be taken more than 3 consecutive days without  telling provider - Do not exceed recommended dose or frequency ~ ~ Rest until symptoms improve. If more than [redacted] weeks pregnant, lie on left side when resting. See another provider immediately if unable to talk with your provider within 1 hour. Consider the closest urgent care or ED if an office or clinic is not available. Another adult should drive. ~ 29/52/8413 24:40:10UV Page 1 of 1 [id number]

## 2011-03-12 NOTE — Progress Notes (Signed)
  Patient Name: Jason [name] Date of Birth: [birth year] Age: 56 y.o. Medical Record Number: [id number] Gender: male Date of Encounter: 03/12/2011  History of Present Illness:  Jason [name] is a 56 y.o. very pleasant male patient who presents with the following:  Headache that won't go away, pain in eyes, ears, coughing. Coughing. Hurting and feeling bad all the back until > 2 weeks. URI sx 2 weeks ago, then progressing, now with pain behind eyes, face, ears, pain with eating. No fever, chills, sweats.  Past Medical History, Surgical History, Social History, Family History, Problem List, Medications, and Allergies have been reviewed and updated if relevant.  Review of Systems: ROS: GEN: Acute illness details above GI: Tolerating PO intake GU: maintaining adequate hydration and urination Pulm: No SOB Interactive and getting along well at home.  Otherwise, ROS is as per the HPI.   Physical Examination: Filed Vitals:   03/12/11 1159  BP: 130/70  Pulse: 82  Temp: 98.4 F (36.9 C)  TempSrc: Oral  Height: 5\' 10"  (1.778 m)  Weight: 209 lb (94.802 kg)  SpO2: 97%    Body mass index is 29.99 kg/(m^2).   Gen: WDWN, NAD; alert,appropriate and cooperative throughout exam  HEENT: Normocephalic and atraumatic. Throat clear, w/o exudate, no LAD, R TM clear, L TM - good landmarks, No fluid present. rhinnorhea.  Left frontal and maxillary sinuses: Tender Right frontal and maxillary sinuses: Tender  Neck: No ant or post LAD CV: RRR, No M/G/R Pulm: Breathing comfortably in no resp distress. no w/c/r Abd: S,NT,ND,+BS Extr: no c/c/e Psych: full affect, pleasant   Assessment and Plan: 1. Maxillary sinusitis  amoxicillin-clavulanate (AUGMENTIN) 875-125 MG per tablet    Classic sinusitis

## 2011-04-28 ENCOUNTER — Other Ambulatory Visit: Payer: Self-pay | Admitting: Internal Medicine

## 2011-04-29 NOTE — Telephone Encounter (Signed)
Rx sent electronically.  

## 2011-06-09 ENCOUNTER — Other Ambulatory Visit: Payer: Self-pay | Admitting: Internal Medicine

## 2011-06-25 ENCOUNTER — Encounter: Payer: BC Managed Care – PPO | Admitting: Internal Medicine

## 2011-06-30 ENCOUNTER — Encounter: Payer: Self-pay | Admitting: Internal Medicine

## 2011-06-30 ENCOUNTER — Ambulatory Visit (INDEPENDENT_AMBULATORY_CARE_PROVIDER_SITE_OTHER): Payer: BC Managed Care – PPO | Admitting: Internal Medicine

## 2011-06-30 VITALS — BP 140/80 | HR 72 | Temp 98.0°F | Wt 212.0 lb

## 2011-06-30 DIAGNOSIS — M545 Low back pain, unspecified: Secondary | ICD-10-CM

## 2011-06-30 DIAGNOSIS — I10 Essential (primary) hypertension: Secondary | ICD-10-CM

## 2011-06-30 DIAGNOSIS — Z0000 Encounter for general adult medical examination without abnormal findings: Secondary | ICD-10-CM

## 2011-06-30 DIAGNOSIS — E039 Hypothyroidism, unspecified: Secondary | ICD-10-CM

## 2011-06-30 NOTE — Progress Notes (Signed)
Subjective:    Patient ID: Jason [name], male    DOB: 06-08-1955, 56 y.o.   MRN: [id number]  HPI Shoulder is better Back to work since after Thanksgiving Back to full duty since last month Work is okay in general  Plantar fasciitis is better also Has new plantars wart on right foot Using OTC meds  Current Outpatient Prescriptions on File Prior to Visit  Medication Sig Dispense Refill  . acetaminophen (TYLENOL) 325 MG tablet Take 650 mg by mouth 2 (two) times daily.        . Ascorbic Acid (VITAMIN C) 1000 MG tablet Take 1,000 mg by mouth daily.        . cyclobenzaprine (FLEXERIL) 10 MG tablet TAKE ONE-HALF TO ONE TABLET BY MOUTH TWICE DAILY AS NEEDED FOR MUSCLE SPASM  60 tablet  1  . levothyroxine (SYNTHROID, LEVOTHROID) 50 MCG tablet TAKE ONE TABLET BY MOUTH EVERY DAY  30 tablet  1  . lisinopril-hydrochlorothiazide (PRINZIDE,ZESTORETIC) 20-25 MG per tablet TAKE ONE TABLET BY MOUTH EVERY DAY  90 tablet  3  . loratadine (CLARITIN) 10 MG tablet Take 10 mg by mouth daily. Can take 2 tabs if needed       . Multiple Vitamin (MULTIVITAMIN) tablet Take 1 tablet by mouth daily.        . ranitidine (ZANTAC) 75 MG tablet Take 75 mg by mouth 2 (two) times daily.          No Known Allergies  Past Medical History  Diagnosis Date  . Allergy   . GERD (gastroesophageal reflux disease)   . Hypertension   . Arthritis     osteoarthritis  . Thyroid disease     hypo    Past Surgical History  Procedure Date  . Tonsillectomy   . Cardiac catheterization 03-2000  . 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 Rotator cuff repair 10/12    Dr Hyacinth Meeker    Family History  Problem Relation Age of Onset  . Gout Father   . Arrhythmia Mother   . Hypertension Mother     History   Social History  . Marital Status: Married    Spouse Name: N/A    Number of Children: 0  . Years of Education: N/A   Occupational History  .  [employer]   Social History Main Topics  . Smoking status: Never Smoker   .  Smokeless tobacco: Never Used  . Alcohol Use: Yes  . Drug Use: No  . Sexually Active: Not on file   Other Topics Concern  . Not on file   Social History Narrative  . No narrative on file   Review of Systems  Constitutional: Negative for fatigue and unexpected weight change.       Wears seat belt  HENT: Positive for congestion and rhinorrhea. Negative for hearing loss, dental problem and tinnitus.        Ringing in right ear with sinus infection ---this is now resolved Allergies control with meds Regular with dentist  Eyes: Negative for visual disturbance.       No unilateral vision loss or diplopia  Respiratory: Negative for cough, chest tightness and shortness of breath.   Cardiovascular: Negative for chest pain and leg swelling.  Gastrointestinal: Negative for nausea, vomiting, abdominal pain, constipation and blood in stool.       No heartburn  Genitourinary: Negative for urgency, frequency and difficulty urinating.       Sporadic nocturia Sexual function is better  Musculoskeletal: Positive for back  pain and arthralgias. Negative for joint swelling.       Occ right hip pain and back pain Uses acetaminophen with success prn  Skin: Negative for rash.       No suspicious lesions  Neurological: Positive for dizziness. Negative for syncope, weakness, light-headedness, numbness and headaches.       Dizzy with the sinus infection  Hematological: Negative for adenopathy. Does not bruise/bleed easily.  Psychiatric/Behavioral: Negative for sleep disturbance and dysphoric mood. The patient is not nervous/anxious.        Objective:   Physical Exam  Constitutional: He is oriented to person, place, and time. He appears well-developed and well-nourished. No distress.  HENT:  Head: Normocephalic and atraumatic.  Right Ear: External ear normal.  Left Ear: External ear normal.  Mouth/Throat: Oropharynx is clear and moist. No oropharyngeal exudate.  Eyes: Conjunctivae and EOM are  normal. Pupils are equal, round, and reactive to light.  Neck: Normal range of motion. Neck supple. No thyromegaly present.  Cardiovascular: Normal rate, regular rhythm, normal heart sounds and intact distal pulses.  Exam reveals no gallop.   No murmur heard. Pulmonary/Chest: Effort normal and breath sounds normal. No respiratory distress. He has no wheezes. He has no rales.  Abdominal: Soft. Bowel sounds are normal. There is no tenderness.  Musculoskeletal: Normal range of motion. He exhibits no edema and no tenderness.       Early right bunion  Lymphadenopathy:    He has no cervical adenopathy.  Neurological: He is alert and oriented to person, place, and time.  Skin: No rash noted. No erythema.  Psychiatric: He has a normal mood and affect. His behavior is normal.          Assessment & Plan:

## 2011-06-30 NOTE — Assessment & Plan Note (Signed)
BP Readings from Last 3 Encounters:  06/30/11 140/80  03/12/11 130/70  12/24/10 148/88   Doing well No changes now

## 2011-06-30 NOTE — Assessment & Plan Note (Signed)
Due for labs

## 2011-06-30 NOTE — Assessment & Plan Note (Signed)
Doing well Back to work Discussed fitness Will check lipid and PSA

## 2011-06-30 NOTE — Assessment & Plan Note (Signed)
Chronic problem Does well with tylenol and occ cyclobenzaprine at night

## 2011-07-01 LAB — HEPATIC FUNCTION PANEL
ALT: 24 IU/L (ref 0–44)
AST: 25 IU/L (ref 0–40)
Albumin: 4.4 g/dL (ref 3.5–5.5)
Alkaline Phosphatase: 75 IU/L (ref 25–150)
Bilirubin, Direct: 0.17 mg/dL (ref 0.00–0.40)

## 2011-07-01 LAB — CBC WITH DIFFERENTIAL/PLATELET
Basophils Absolute: 0.1 10*3/uL (ref 0.0–0.2)
Basos: 1 % (ref 0–3)
Eos: 2 % (ref 0–7)
HCT: 44 % (ref 37.5–51.0)
Hemoglobin: 14.8 g/dL (ref 12.6–17.7)
Immature Granulocytes: 0 % (ref 0–2)
Lymphocytes Absolute: 1.4 10*3/uL (ref 0.7–4.5)
Lymphs: 15 % (ref 14–46)
MCH: 31.2 pg (ref 26.6–33.0)
MCHC: 33.6 g/dL (ref 31.5–35.7)
MCV: 93 fL (ref 79–97)
Monocytes Absolute: 0.5 10*3/uL (ref 0.1–1.0)
Monocytes: 6 % (ref 4–13)
Neutrophils Absolute: 7 10*3/uL (ref 1.8–7.8)
RBC: 4.75 x10E6/uL (ref 4.14–5.80)
WBC: 9.1 10*3/uL (ref 4.0–10.5)

## 2011-07-01 LAB — BASIC METABOLIC PANEL
BUN/Creatinine Ratio: 18 (ref 9–20)
BUN: 16 mg/dL (ref 6–24)
CO2: 25 mmol/L (ref 20–32)
Calcium: 9.6 mg/dL (ref 8.7–10.2)
Chloride: 96 mmol/L — ABNORMAL LOW (ref 97–108)
Creatinine, Ser: 0.88 mg/dL (ref 0.76–1.27)
GFR calc non Af Amer: 98 mL/min/{1.73_m2} (ref >59)
Glucose: 94 mg/dL (ref 65–99)

## 2011-07-01 LAB — T4, FREE: Free T4: 0.81 ng/dL — ABNORMAL LOW (ref 0.82–1.77)

## 2011-07-01 LAB — LIPID PANEL
Chol/HDL Ratio: 3.2 ratio units (ref 0.0–5.0)
Cholesterol, Total: 163 mg/dL (ref 100–199)
HDL: 51 mg/dL (ref >39)
VLDL Cholesterol Cal: 14 mg/dL (ref 5–40)

## 2011-07-01 LAB — PSA: PSA: 0.7 ng/mL (ref 0.0–4.0)

## 2011-07-07 ENCOUNTER — Encounter: Payer: Self-pay | Admitting: *Deleted

## 2011-07-07 MED ORDER — LEVOTHYROXINE SODIUM 75 MCG PO TABS: 75.0000 ug | ORAL_TABLET | Freq: Every day | ORAL | Status: DC

## 2011-08-06 ENCOUNTER — Ambulatory Visit (INDEPENDENT_AMBULATORY_CARE_PROVIDER_SITE_OTHER): Payer: BC Managed Care – PPO | Admitting: Family Medicine

## 2011-08-06 ENCOUNTER — Encounter: Payer: Self-pay | Admitting: Family Medicine

## 2011-08-06 VITALS — BP 130/80 | HR 74 | Temp 98.7°F | Ht 70.0 in | Wt 211.5 lb

## 2011-08-06 DIAGNOSIS — G5700 Lesion of sciatic nerve, unspecified lower limb: Secondary | ICD-10-CM

## 2011-08-06 DIAGNOSIS — G5702 Lesion of sciatic nerve, left lower limb: Secondary | ICD-10-CM

## 2011-08-06 NOTE — Progress Notes (Signed)
Nature conservation officer at [facility] [address] [place] [address] Phone: [phone] Fax: [phone]   Patient Name: Jason [name] Date of Birth: [birth year] Medical Record Number: [id number] Gender: male Date of Encounter: 08/06/2011  History of Present Illness:  Jason [name] is a 56 y.o. very pleasant male patient who presents with the following:  Pleasant gentleman with a history of right total hip arthroplasty presents with left-sided lower posterior buttocks pain. He will primarily of pain in the buttocks and this will sometimes go down into the upper part of his upper thigh and the posterior, but never radiates down further than this point. He is not having any significant back pain, lateral pain, groin pain, or pain in the knee. In the lower part -- and down in the left buttock.   Works at [employer]   Patient Active Problem List  Diagnosis  . HYPOTHYROIDISM  . HYPERTENSION  . ALLERGIC RHINITIS  . LUNG NODULE  . GERD  . OSTEOARTHRITIS  . BACK PAIN, LUMBAR  . Routine general medical examination at a health care facility  . Special screening for malignant neoplasm of prostate   Past Medical History  Diagnosis Date  . Allergy   . GERD (gastroesophageal reflux disease)   . Hypertension   . Arthritis     osteoarthritis  . Thyroid disease     hypo   Past Surgical History  Procedure Date  . Tonsillectomy   . Cardiac catheterization 03-2000  . 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 Rotator cuff repair 10/12    Dr Hyacinth Meeker   History  Substance Use Topics  . Smoking status: Never Smoker   . Smokeless tobacco: Never Used  . Alcohol Use: Yes   Family History  Problem Relation Age of Onset  . Gout Father   . Arrhythmia Mother   . Hypertension Mother    No Known Allergies  Medication list has been reviewed and updated.  Prior to Admission medications   Medication Sig Start Date End Date Taking? Authorizing Provider  acetaminophen (TYLENOL) 325 MG  tablet Take 650 mg by mouth 2 (two) times daily.     Yes Historical Provider, MD  Ascorbic Acid (VITAMIN C) 1000 MG tablet Take 1,000 mg by mouth daily.     Yes Historical Provider, MD  cyclobenzaprine (FLEXERIL) 10 MG tablet TAKE ONE-HALF TO ONE TABLET BY MOUTH TWICE DAILY AS NEEDED FOR MUSCLE SPASM 04/28/11  Yes Karie Schwalbe, MD  levothyroxine (SYNTHROID, LEVOTHROID) 75 MCG tablet Take 1 tablet (75 mcg total) by mouth daily. 07/07/11 07/06/12 Yes Karie Schwalbe, MD  lisinopril-hydrochlorothiazide (PRINZIDE,ZESTORETIC) 20-25 MG per tablet TAKE ONE TABLET BY MOUTH EVERY DAY 08/15/10  Yes Karie Schwalbe, MD  loratadine (CLARITIN) 10 MG tablet Take 10 mg by mouth daily. Can take 2 tabs if needed    Yes Historical Provider, MD  Multiple Vitamin (MULTIVITAMIN) tablet Take 1 tablet by mouth daily.     Yes Historical Provider, MD  ranitidine (ZANTAC) 75 MG tablet Take 75 mg by mouth 2 (two) times daily.     Yes Historical Provider, MD    Review of Systems:   GEN: No fevers, chills. Nontoxic. Primarily MSK c/o today. MSK: Detailed in the HPI GI: tolerating PO intake without difficulty Neuro: No numbness, parasthesias, or tingling associated. Otherwise the pertinent positives of the ROS are noted above.    Physical Examination: Filed Vitals:   08/06/11 1536  BP: 130/80  Pulse: 74  Temp: 98.7  F (37.1 C)   Filed Vitals:   08/06/11 1536  Height: 5\' 10"  (1.778 m)  Weight: 211 lb 8 oz (95.936 kg)   Body mass index is 30.35 kg/(m^2). Ideal Body Weight: Weight in (lb) to have BMI = 25: 173.9    GEN: Well-developed,well-nourished,in no acute distress; alert,appropriate and cooperative throughout examination HEENT: Normocephalic and atraumatic without obvious abnormalities. Ears, externally no deformities PULM: Breathing comfortably in no respiratory distress EXT: No clubbing, cyanosis, or edema PSYCH: Normally interactive. Cooperative during the interview. Pleasant. Friendly and  conversant. Not anxious or depressed appearing. Normal, full affect.  Range of motion at  the waist: Flexion, extension, lateral bending and rotation:   No echymosis or edema Rises to examination table with mild difficulty Gait: minimally antalgic  Inspection/Deformity: N Paraspinus Tenderness: none  B Ankle Dorsiflexion (L5,4): 5/5 B Great Toe Dorsiflexion (L5,4): 5/5 Heel Walk (L5): WNL Toe Walk (S1): WNL Rise/Squat (L4): WNL, mild pain  SENSORY B Medial Foot (L4): WNL B Dorsum (L5): WNL B Lateral (S1): WNL Light Touch: WNL Pinprick: WNL  REFLEXES Knee (L4): 2+ Ankle (S1): 2+  Left hip with full range of motion, normal abduction, and internal and external rotation.  B SLR, seated: neg B SLR, supine: neg B FABER: neg B Reverse FABER: pos  Piriformis ttp B Greater Troch: NT B Log Roll: neg B Stork: NT B Sciatic Notch: NT   Assessment and Plan: 1. Piriformis syndrome of left side     Different from prior back pain - i don't think coming from a back focus. If not improving in a month or so, will need to reassess  Piriformis Syndrome >25 minutes spent in face to face time with patient, >50% spent in counselling or coordination of care: Reviewed anatomy Also given a handout with more extensive Piriformis stretching, hip flexor and abductor strengthening, ham stretching  Rec deep massage, explained self-massage with ball   [name], MD

## 2011-08-06 NOTE — Patient Instructions (Addendum)
PIRIFORMIS SYNDROME REHAB 1. Work on pretzel stretching, shoulder back and leg draped in front. 3-5 sets, 30 sec.. 2. hip abductor rotations. standing, hip flexion and rotation outward then inward. 3 sets, 15 reps. when can do comfortably, add ankle weights starting at 2 pounds.  3. cross over stretching - shoulder back to ground, same side leg crossover. 3-5 sets for 30 min..  4. SINK STRETCH - YOU CAN DO THIS WHENEVER YOU WANT DURING THE DAY  Tennis ball underneath area in buttocks - on a hard surface underneath Can also massage this area with an electronic massager or hand  

## 2011-08-13 ENCOUNTER — Other Ambulatory Visit: Payer: Self-pay | Admitting: Internal Medicine

## 2011-08-26 ENCOUNTER — Other Ambulatory Visit: Payer: Self-pay | Admitting: Internal Medicine

## 2011-09-01 ENCOUNTER — Telehealth: Payer: Self-pay

## 2011-09-01 NOTE — Telephone Encounter (Signed)
Pt works at [employer] and on 08/28/11 he cut himself on metal ladder he thinks; is not sure what was cut on. Pt wanted to know last tetanus. Pt last tetanus 03-26-06. Does pt need another tetanus?

## 2011-09-02 NOTE — Telephone Encounter (Signed)
Left message on home VM that pt does not need another tetanus

## 2011-09-08 ENCOUNTER — Ambulatory Visit (INDEPENDENT_AMBULATORY_CARE_PROVIDER_SITE_OTHER): Payer: BC Managed Care – PPO | Admitting: Family Medicine

## 2011-09-08 ENCOUNTER — Telehealth: Payer: Self-pay | Admitting: Internal Medicine

## 2011-09-08 ENCOUNTER — Encounter: Payer: Self-pay | Admitting: Family Medicine

## 2011-09-08 VITALS — BP 138/78 | HR 76 | Temp 98.3°F | Ht 70.0 in | Wt 213.0 lb

## 2011-09-08 DIAGNOSIS — N476 Balanoposthitis: Secondary | ICD-10-CM

## 2011-09-08 DIAGNOSIS — N481 Balanitis: Secondary | ICD-10-CM

## 2011-09-08 DIAGNOSIS — B356 Tinea cruris: Secondary | ICD-10-CM

## 2011-09-08 DIAGNOSIS — R350 Frequency of micturition: Secondary | ICD-10-CM

## 2011-09-08 LAB — POCT URINALYSIS DIPSTICK
Bilirubin, UA: NEGATIVE
Blood, UA: NEGATIVE
Ketones, UA: NEGATIVE
Leukocytes, UA: NEGATIVE
Nitrite, UA: NEGATIVE
Protein, UA: NEGATIVE
Spec Grav, UA: 1.01
pH, UA: 6

## 2011-09-08 MED ORDER — FLUCONAZOLE 150 MG PO TABS: 150.0000 mg | ORAL_TABLET | Freq: Once | ORAL | Status: AC

## 2011-09-08 NOTE — Progress Notes (Signed)
Nature conservation officer at [facility] [address] [place] [address] Phone: [phone] Fax: [phone]  Date:  09/08/2011   Name:  Jason [name]   DOB:  [birth year]   MRN:  [id number]  PCP:  Tillman Abide, MD    Chief Complaint: urine frequency   History of Present Illness:  Jason [name] is a 56 y.o. very pleasant male patient who presents with the following:  Genital itching:  More frequent urination, and a heat feeling and itching and itch on the penis. Some pain with urination. No bloody discharge, discharge in general, or pain with ejaculation. No STD potential exposure.  Mostly just proximal to the glans and some redness in scrotum  Past Medical History, Surgical History, Social History, Family History, Problem List, Medications, and Allergies have been reviewed and updated if relevant.  Current Outpatient Prescriptions on File Prior to Visit  Medication Sig Dispense Refill  . acetaminophen (TYLENOL) 325 MG tablet Take 650 mg by mouth 2 (two) times daily.        . Ascorbic Acid (VITAMIN C) 1000 MG tablet Take 1,000 mg by mouth daily.        . cyclobenzaprine (FLEXERIL) 10 MG tablet TAKE ONE-HALF TO ONE TABLET BY MOUTH TWICE DAILY AS NEEDED FOR MUSCLE SPASM.  60 tablet  0  . levothyroxine (SYNTHROID, LEVOTHROID) 75 MCG tablet Take 1 tablet (75 mcg total) by mouth daily.  90 tablet  3  . lisinopril-hydrochlorothiazide (PRINZIDE,ZESTORETIC) 20-25 MG per tablet TAKE ONE TABLET BY MOUTH EVERY DAY  90 tablet  3  . loratadine (CLARITIN) 10 MG tablet Take 10 mg by mouth daily. Can take 2 tabs if needed       . Multiple Vitamin (MULTIVITAMIN) tablet Take 1 tablet by mouth daily.        . ranitidine (ZANTAC) 75 MG tablet Take 75 mg by mouth 2 (two) times daily.         Review of Systems:  GEN: No acute illnesses, no fevers, chills. GI: No n/v/d, eating normally Pulm: No SOB Interactive and getting along well at home.  Otherwise, ROS is as per the HPI.    Physical Examination: Filed Vitals:   09/08/11 1102  BP: 138/78  Pulse: 76  Temp: 98.3 F (36.8 C)   Filed Vitals:   09/08/11 1102  Height: 5\' 10"  (1.778 m)  Weight: 213 lb (96.616 kg)   Body mass index is 30.56 kg/(m^2). Ideal Body Weight: Weight in (lb) to have BMI = 25: 173.9    GEN: WDWN, NAD, Non-toxic, Alert & Oriented x 3 HEENT: Atraumatic, Normocephalic.  Ears and Nose: No external deformity. EXTR: No clubbing/cyanosis/edema NEURO: Normal gait.  PSYCH: Normally interactive. Conversant. Not depressed or anxious appearing.  Calm demeanor.  GU: normal male, redness at edge of glans and some redness in skin and at folds of skin adjacent to the glans. Some redness at scrotum.  Assessment and Plan:  1. Balanitis    2. Urine frequency  POCT urinalysis dipstick  3. Jock itch     I suspect this is a balanitis equivalent with yeast under the skin near the edge of glans. Orals and topicals.  Topical lamisil and diflucan  Scrotum more like typical jock itch, and advised otc powder  Results for orders placed in visit on 09/08/11  POCT URINALYSIS DIPSTICK      Component Value Range   Color, UA yellow     Clarity, UA clear     Glucose,  UA negative     Bilirubin, UA negative     Ketones, UA negative     Spec Grav, UA 1.010     Blood, UA negative     pH, UA 6.0     Protein, UA negative     Urobilinogen, UA 0.2     Nitrite, UA negative     Leukocytes, UA Negative       Orders Today:  Orders Placed This Encounter  Procedures  . POCT urinalysis dipstick    Medications Today: (Includes new updates added during medication reconciliation) Meds ordered this encounter  Medications  . ibuprofen (ADVIL,MOTRIN) 800 MG tablet    Sig: Take 800 mg by mouth at bedtime.  . traMADol-acetaminophen (ULTRACET) 37.5-325 MG per tablet    Sig: Take 1 tablet by mouth at bedtime.  . fluconazole (DIFLUCAN) 150 MG tablet    Sig: Take 1 tablet (150 mg total) by mouth once.     Dispense:  1 tablet    Refill:  0     [name], MD

## 2011-09-08 NOTE — Patient Instructions (Signed)
Over the counter, Lamisil cream, twice a day

## 2011-09-08 NOTE — Telephone Encounter (Signed)
Caller: Dan/Patient; PCP: Tillman Abide; CB#: [phone];  Call regarding Frequent Urination, Increased Warmth in Penis, Itching Scrotum; febrile/ subjective.  Appointment with Dr. Patsy Lager at 10:30 per Urinary Sx- Male  protocol.

## 2011-09-18 ENCOUNTER — Other Ambulatory Visit: Payer: Self-pay

## 2011-09-18 NOTE — Telephone Encounter (Signed)
Pt requesting refill Cyclobenzaprine, pt did not pick up 08/26/11 refill; was injured at work and workers comp dr gave 10 day rx for cyclobenzaprine. Spoke with Lorene Dy at [address] and 08/26/11 refill still available. Pt will get that rx filled.

## 2011-10-07 ENCOUNTER — Other Ambulatory Visit: Payer: Self-pay | Admitting: Internal Medicine

## 2011-10-07 DIAGNOSIS — E039 Hypothyroidism, unspecified: Secondary | ICD-10-CM

## 2011-10-08 ENCOUNTER — Other Ambulatory Visit (INDEPENDENT_AMBULATORY_CARE_PROVIDER_SITE_OTHER): Payer: BC Managed Care – PPO

## 2011-10-08 DIAGNOSIS — E039 Hypothyroidism, unspecified: Secondary | ICD-10-CM

## 2011-10-14 ENCOUNTER — Encounter: Payer: Self-pay | Admitting: *Deleted

## 2011-11-18 ENCOUNTER — Other Ambulatory Visit: Payer: Self-pay | Admitting: Internal Medicine

## 2012-01-19 ENCOUNTER — Other Ambulatory Visit: Payer: Self-pay | Admitting: Internal Medicine

## 2012-02-04 ENCOUNTER — Telehealth: Payer: Self-pay | Admitting: Internal Medicine

## 2012-02-04 NOTE — Telephone Encounter (Signed)
Patient Information:  Caller Name: Pavle  Phone: [phone]  Patient: Jason [name]  Gender: Male  DOB: Oct 05, 1955  Age: 56 Years  PCP: Tillman Abide [facility])  Office Follow Up:  Does the office need to follow up with this patient?: No  Instructions For The Office: N/A  RN Note:  Occasionally notes mild pain in left shoulder as well as midabdomen below sternum.  States he did hurt his shoulder in July when he fell off a ladder.  Denies cardiac symptoms or shortness of breath.  Per protocol, emergent symptoms denied; advised appt within 24 hours.  Appt scheduled 02/05/12 1400 with Dr. Para March.  krs/can  Symptoms  Reason For Call & Symptoms: Intermittent abdominal pain, especially on empty stomach, x 2 weeks.  c/o headache along with nausea.  Improves with a meal, but that only lasts for ~ 2 hours.  Eating more frequent small meals, but feels shaky before eating.  Mild dizziness occurred 02/04/12 before lunch.  Reviewed Health History In EMR: Yes  Reviewed Medications In EMR: Yes  Reviewed Allergies In EMR: Yes  Reviewed Surgeries / Procedures: Yes  Date of Onset of Symptoms: 01/20/2012  Guideline(s) Used:  Abdominal Pain - Upper  Disposition Per Guideline:   See Today or Tomorrow in Office  Reason For Disposition Reached:   Mild pain that comes and goes (cramps) lasts > 24 hours  Advice Given:  N/A  Appointment Scheduled:  02/05/2012 14:00:00 Appointment Scheduled Provider:  Crawford Givens Clelia Croft) [facility])

## 2012-02-05 ENCOUNTER — Encounter: Payer: Self-pay | Admitting: Family Medicine

## 2012-02-05 ENCOUNTER — Ambulatory Visit (INDEPENDENT_AMBULATORY_CARE_PROVIDER_SITE_OTHER): Payer: BC Managed Care – PPO | Admitting: Family Medicine

## 2012-02-05 VITALS — BP 128/80 | HR 78 | Temp 98.0°F | Wt 215.0 lb

## 2012-02-05 DIAGNOSIS — K219 Gastro-esophageal reflux disease without esophagitis: Secondary | ICD-10-CM

## 2012-02-05 NOTE — Progress Notes (Signed)
Pain in epigastrum.  Pain tends to wax and wane.  Going on about 10 days.  No clear pattern for the pain waxing and waning, it is irregular.  Not always painful.  Can range from no/minimal pain to 6/10 pain. Always in epigastrum.  No blood in stool, no vomiting.  Diarrhea today only.  No fevers, known but yesterday he had a long time between breakfast and snack and felt lightheaded.  He was flushed yesterday and felt hot.  No sweats.  Not SOB.  No L arm pain.   Had happened in 2002, similar sx.  He went to ER in 2002 because he thought it was his heart.  Cath was negative.  It was thought by PCP at the time, in retrospect, that he had GERD.    Not taking ibuprofen or caffeine/aspirin meds now.  Stopped those meds last week.    He tends to get relief from maalox bid.   He has been drinking a lot of soda and caffeine.  Snacks were chocolate/candy.  He has worked on his diet a lot in the last 10 days.  He's been very busy at work.   Still with mild epigastric sensation at time of exam.   Meds, vitals, and allergies reviewed.   ROS: See HPI.  Otherwise, noncontributory.  GEN: nad, alert and oriented HEENT: mucous membranes moist NECK: supple w/o LA CV: rrr.  PULM: ctab, no inc wob ABD: soft, +bs, not ttp EXT: no edema SKIN: no acute rash

## 2012-02-05 NOTE — Patient Instructions (Addendum)
Stay off aleve/ibuprofen/aspirin/caffeine/chocolate/soda.  Take maalox twice a day as needed for the next 10-14 days.  Stop the zantac.  Take prilosec 20mg  a day for 2 weeks. Drink plenty of water.  Take care.

## 2012-02-05 NOTE — Assessment & Plan Note (Signed)
Likely recent exacerbation.  No sign of ominous dx. See instructions.

## 2012-03-19 ENCOUNTER — Other Ambulatory Visit: Payer: Self-pay | Admitting: Internal Medicine

## 2012-05-24 ENCOUNTER — Other Ambulatory Visit: Payer: Self-pay | Admitting: Internal Medicine

## 2012-06-29 ENCOUNTER — Encounter: Payer: Self-pay | Admitting: Internal Medicine

## 2012-06-29 ENCOUNTER — Ambulatory Visit (INDEPENDENT_AMBULATORY_CARE_PROVIDER_SITE_OTHER): Payer: BC Managed Care – PPO | Admitting: Internal Medicine

## 2012-06-29 VITALS — BP 150/90 | HR 72 | Temp 97.8°F | Wt 206.0 lb

## 2012-06-29 DIAGNOSIS — I10 Essential (primary) hypertension: Secondary | ICD-10-CM

## 2012-06-29 DIAGNOSIS — M545 Low back pain, unspecified: Secondary | ICD-10-CM

## 2012-06-29 DIAGNOSIS — E039 Hypothyroidism, unspecified: Secondary | ICD-10-CM

## 2012-06-29 DIAGNOSIS — Z0000 Encounter for general adult medical examination without abnormal findings: Secondary | ICD-10-CM

## 2012-06-29 DIAGNOSIS — K219 Gastro-esophageal reflux disease without esophagitis: Secondary | ICD-10-CM

## 2012-06-29 NOTE — Addendum Note (Signed)
Addended by: Baldomero Lamy on: 06/29/2012 01:12 PM   Modules accepted: Orders

## 2012-06-29 NOTE — Progress Notes (Signed)
Subjective:    Patient ID: Jason [name], male    DOB: [birth year], 57 y.o.   MRN: [id number]  HPI Here for physical Several minor issues over the past year---all better  Has cut out soda--causing some of the stomach trouble Also cut out chocolate and caffeine also Lost 9#  Trying to do some exercise---twice a week. 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 acetaminophen (TYLENOL) 325 MG tablet Take 650 mg by mouth 2 (two) times daily.        . cyclobenzaprine (FLEXERIL) 10 MG tablet TAKE ONE-HALF TO ONE TABLET BY MOUTH TWICE DAILY AS NEEDED FOR MUSCLE SPASM  60 tablet  0  . levothyroxine (SYNTHROID, LEVOTHROID) 75 MCG tablet Take 1 tablet (75 mcg total) by mouth daily.  90 tablet  3  . lisinopril-hydrochlorothiazide (PRINZIDE,ZESTORETIC) 20-25 MG per tablet TAKE ONE TABLET BY MOUTH EVERY DAY  90 tablet  3  . loratadine (CLARITIN) 10 MG tablet Take 10 mg by mouth daily. Can take 2 tabs if needed       . Multiple Vitamin (MULTIVITAMIN) tablet Take 1 tablet by mouth daily.        . ranitidine (ZANTAC) 75 MG tablet Take 75 mg by mouth 2 (two) times daily.        No current facility-administered medications on file prior to visit.    No Known Allergies  Past Medical History  Diagnosis Date  . Allergy   . GERD (gastroesophageal reflux disease)   . Hypertension   . Arthritis     osteoarthritis  . Thyroid disease     hypo    Past Surgical History  Procedure Laterality Date  . Tonsillectomy    . Cardiac catheterization  03-2000  . 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 Rotator cuff repair  10/12    Dr Hyacinth Meeker    Family History  Problem Relation Age of Onset  . Gout  Father   . Arrhythmia Mother   . Hypertension Mother     History   Social History  . Marital Status: Married    Spouse Name: N/A    Number of Children: 0  . 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 Smoking status: Never Smoker   . Smokeless tobacco: Never Used  . Alcohol Use: Yes  . Drug Use: No  . Sexually Active: Not on file   Other Topics Concern  . Not on file   Social History Narrative  . No narrative on file   Review of Systems  Constitutional: Negative for fatigue and unexpected weight change.       Wear seat belt  HENT: Positive for congestion, rhinorrhea and postnasal drip. Negative for hearing loss, dental problem and tinnitus.        Regular with dentist  Eyes: Negative for visual disturbance.       No diplopia or unilateral vision loss  Respiratory: Positive for cough. Negative for chest tightness and shortness of breath.        Some cough from PND  Cardiovascular: Negative for chest pain, palpitations and leg swelling.  Gastrointestinal: Negative for  nausea, vomiting, abdominal pain, constipation and blood in stool.       No heartburn  Endocrine: Negative for cold intolerance and heat intolerance.  Genitourinary: Negative for urgency, frequency and difficulty urinating.       No sexual problems  Musculoskeletal: Positive for back pain and arthralgias. Negative for joint swelling.  Skin: Negative for rash.       No suspicious lesions  Allergic/Immunologic: Positive for environmental allergies. Negative for immunocompromised state.       Satisfied with the loratadine  Neurological: Negative for dizziness, syncope, weakness, light-headedness, numbness and headaches.  Hematological: Negative for adenopathy. Does not bruise/bleed easily.  Psychiatric/Behavioral: Negative for sleep disturbance and dysphoric mood. The patient is not nervous/anxious.        Objective:   Physical Exam   Constitutional: He is oriented to person, place, and time. He appears well-developed and well-nourished. No distress.  HENT:  Head: Normocephalic and atraumatic.  Right Ear: External ear normal.  Left Ear: External ear normal.  Mouth/Throat: Oropharynx is clear and moist. No oropharyngeal exudate.  Eyes: Conjunctivae and EOM are normal. Pupils are equal, round, and reactive to light.  Neck: Normal range of motion. Neck supple. No thyromegaly present.  Cardiovascular: Normal rate, regular rhythm, normal heart sounds and intact distal pulses.  Exam reveals no gallop.   No murmur heard. Pulmonary/Chest: Effort normal and breath sounds normal. No respiratory distress. He has no wheezes. He has no rales.  Abdominal: Soft. There is no tenderness.  Musculoskeletal: He exhibits no edema and no tenderness.  Lymphadenopathy:    He has no cervical adenopathy.  Neurological: He is alert and oriented to person, place, and time.  Skin: No rash noted. No erythema.  Psychiatric: He has a normal mood and affect. His behavior is normal.          Assessment & Plan:

## 2012-06-29 NOTE — Assessment & Plan Note (Signed)
Still uses the cyclobenzaprine at night

## 2012-06-29 NOTE — Assessment & Plan Note (Signed)
Okay with dietary changes and daily ranitidine

## 2012-06-29 NOTE — Assessment & Plan Note (Signed)
Generally healthy Has been working more on fitness Will defer PSA to next year UTD on imms and colon

## 2012-06-29 NOTE — Assessment & Plan Note (Signed)
BP Readings from Last 3 Encounters:  06/29/12 150/90  02/05/12 128/80  09/08/11 138/78   Generally okay Recheck on right 136/100 No changes for now Is working on lifestyle

## 2012-06-29 NOTE — Assessment & Plan Note (Signed)
Clinically euthyroid Will check labs 

## 2012-06-30 ENCOUNTER — Encounter: Payer: Self-pay | Admitting: *Deleted

## 2012-06-30 LAB — BASIC METABOLIC PANEL
BUN/Creatinine Ratio: 20 (ref 9–20)
Calcium: 9.6 mg/dL (ref 8.7–10.2)
Creatinine, Ser: 0.85 mg/dL (ref 0.76–1.27)
GFR calc Af Amer: 114 mL/min/{1.73_m2} (ref >59)
GFR calc non Af Amer: 99 mL/min/{1.73_m2} (ref >59)
Potassium: 4.2 mmol/L (ref 3.5–5.2)
Sodium: 140 mmol/L (ref 134–144)

## 2012-06-30 LAB — HEPATIC FUNCTION PANEL
AST: 27 IU/L (ref 0–40)
Total Bilirubin: 1 mg/dL (ref 0.0–1.2)
Total Protein: 7 g/dL (ref 6.0–8.5)

## 2012-06-30 LAB — CBC WITH DIFFERENTIAL/PLATELET
Basos: 1 % (ref 0–3)
Eos: 2 % (ref 0–5)
HCT: 44.7 % (ref 37.5–51.0)
Immature Grans (Abs): 0 10*3/uL (ref 0.0–0.1)
Lymphocytes Absolute: 1.6 10*3/uL (ref 0.7–3.1)
MCHC: 32.9 g/dL (ref 31.5–35.7)
MCV: 94 fL (ref 79–97)
Monocytes Absolute: 0.6 10*3/uL (ref 0.1–0.9)
RBC: 4.77 x10E6/uL (ref 4.14–5.80)
WBC: 7.5 10*3/uL (ref 3.4–10.8)

## 2012-06-30 LAB — TSH: TSH: 7.09 u[IU]/mL — ABNORMAL HIGH (ref 0.450–4.500)

## 2012-06-30 LAB — T4, FREE: Free T4: 0.93 ng/dL (ref 0.82–1.77)

## 2012-07-05 ENCOUNTER — Other Ambulatory Visit: Payer: Self-pay | Admitting: Internal Medicine

## 2012-07-08 ENCOUNTER — Telehealth: Payer: Self-pay

## 2012-07-08 NOTE — Telephone Encounter (Signed)
Called [facility] pharmacy and gave verbal ok to change to sandoz

## 2012-07-08 NOTE — Telephone Encounter (Signed)
That is okay.

## 2012-07-08 NOTE — Telephone Encounter (Signed)
Pt called requesting call to [facility] [address] that it is OK to change vendors from [employer] to [facility] for levothyroxine.Please advise.

## 2012-07-22 ENCOUNTER — Other Ambulatory Visit: Payer: Self-pay | Admitting: Internal Medicine

## 2012-08-11 ENCOUNTER — Other Ambulatory Visit: Payer: Self-pay | Admitting: Internal Medicine

## 2012-09-22 ENCOUNTER — Other Ambulatory Visit: Payer: Self-pay | Admitting: Internal Medicine

## 2012-10-23 ENCOUNTER — Emergency Department: Payer: Self-pay | Admitting: Emergency Medicine

## 2012-11-03 ENCOUNTER — Emergency Department: Payer: Self-pay | Admitting: Internal Medicine

## 2012-11-08 ENCOUNTER — Other Ambulatory Visit: Payer: Self-pay | Admitting: Internal Medicine

## 2012-11-22 ENCOUNTER — Other Ambulatory Visit: Payer: Self-pay | Admitting: Internal Medicine

## 2013-01-24 ENCOUNTER — Other Ambulatory Visit: Payer: Self-pay | Admitting: Internal Medicine

## 2013-02-06 ENCOUNTER — Other Ambulatory Visit: Payer: Self-pay | Admitting: Internal Medicine

## 2013-03-24 ENCOUNTER — Other Ambulatory Visit: Payer: Self-pay | Admitting: Internal Medicine

## 2013-05-10 ENCOUNTER — Other Ambulatory Visit: Payer: Self-pay | Admitting: Internal Medicine

## 2013-05-23 ENCOUNTER — Other Ambulatory Visit: Payer: Self-pay | Admitting: Internal Medicine

## 2013-06-17 ENCOUNTER — Encounter: Payer: Self-pay | Admitting: Internal Medicine

## 2013-06-17 ENCOUNTER — Ambulatory Visit (INDEPENDENT_AMBULATORY_CARE_PROVIDER_SITE_OTHER): Payer: BC Managed Care – PPO | Admitting: Internal Medicine

## 2013-06-17 VITALS — BP 128/80 | HR 73 | Temp 98.3°F | Wt 208.0 lb

## 2013-06-17 DIAGNOSIS — N410 Acute prostatitis: Secondary | ICD-10-CM

## 2013-06-17 DIAGNOSIS — R300 Dysuria: Secondary | ICD-10-CM

## 2013-06-17 LAB — POCT URINALYSIS DIPSTICK
Bilirubin, UA: NEGATIVE
Blood, UA: NEGATIVE
GLUCOSE UA: NEGATIVE
KETONES UA: NEGATIVE
LEUKOCYTES UA: NEGATIVE
Nitrite, UA: NEGATIVE
Protein, UA: NEGATIVE
UROBILINOGEN UA: NEGATIVE
pH, UA: 7.5

## 2013-06-17 MED ORDER — DOXYCYCLINE MONOHYDRATE 100 MG PO TABS: 100.0000 mg | ORAL_TABLET | Freq: Two times a day (BID) | ORAL | Status: DC

## 2013-06-17 NOTE — Progress Notes (Signed)
   Subjective:    Patient ID: Jason [name], male    DOB: [birth year], 58 y.o.   MRN: [id number]  HPI Having trouble with urination Some burning dysuria 3 days ago Increased fluid and tried some cranberry juice Had frequent nocturia last night---prompted his call  Daytime urgency as well No hematuria  Has had mild burning in past---not much No problems with sex or hematospermia  Has had prostatitis in past---seems fairly similar  Current Outpatient Prescriptions on File Prior to Visit  Medication Sig Dispense Refill  . acetaminophen (TYLENOL) 325 MG tablet Take 650 mg by mouth 2 (two) times daily.        . cyclobenzaprine (FLEXERIL) 10 MG tablet TAKE ONE-HALF TO ONE TABLET BY MOUTH TWICE DAILY AS NEEDED FOR MUSCLE SPASM  60 tablet  0  . levothyroxine (SYNTHROID, LEVOTHROID) 75 MCG tablet TAKE ONE TABLET BY MOUTH EVERY DAY  90 tablet  3  . lisinopril-hydrochlorothiazide (PRINZIDE,ZESTORETIC) 20-25 MG per tablet TAKE ONE TABLET BY MOUTH ONCE DAILY  90 tablet  0  . loratadine (CLARITIN) 10 MG tablet Take 10 mg by mouth daily. Can take 2 tabs if needed       . Multiple Vitamin (MULTIVITAMIN) tablet Take 1 tablet by mouth daily.        . ranitidine (ZANTAC) 75 MG tablet Take 75 mg by mouth 2 (two) times daily.        No current facility-administered medications on file prior to visit.    No Known Allergies  Past Medical History  Diagnosis Date  . Allergy   . GERD (gastroesophageal reflux disease)   . Hypertension   . Arthritis     osteoarthritis  . Thyroid disease     hypo    Past Surgical History  Procedure Laterality Date  . Tonsillectomy    . Cardiac catheterization  03-2000  . 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 Rotator cuff repair  10/12    Dr [name] Heck    Family History  Problem Relation Age of Onset  . Gout Father   . Arrhythmia Mother   . Hypertension Mother     History   Social History  . Marital Status: Married    Spouse Name: N/A    Number of  Children: 0  . 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 Smoking status: Never Smoker   . Smokeless tobacco: Never Used  . Alcohol Use: Yes  . Drug Use: No  . Sexual Activity: Not on file   Other Topics Concern  . Not on file   Social History Narrative  . No narrative on file   Review of Systems No new back pain No fever Bowels are regular Appetite is fine    Objective:   Physical Exam  Constitutional: He appears well-developed and well-nourished. No distress.  Abdominal: Soft. He exhibits no distension and no mass. There is no tenderness.  Genitourinary:  Normal rectum Prostate is slightly boggy---slight tenderness but some urgency with palpation          Assessment & Plan:

## 2013-06-17 NOTE — Progress Notes (Signed)
Pre visit review using our clinic review tool, if applicable. No additional management support is needed unless otherwise documented below in the visit note. 

## 2013-06-17 NOTE — Assessment & Plan Note (Signed)
Pretty clear cut picture Will treat with antibiotic for 3 weeks If recurs or persists, will add tamsulosin

## 2013-07-01 ENCOUNTER — Encounter: Payer: Self-pay | Admitting: Internal Medicine

## 2013-07-01 ENCOUNTER — Ambulatory Visit (INDEPENDENT_AMBULATORY_CARE_PROVIDER_SITE_OTHER): Payer: BC Managed Care – PPO | Admitting: Internal Medicine

## 2013-07-01 VITALS — BP 146/94 | HR 62 | Temp 97.7°F | Ht 70.0 in | Wt 214.0 lb

## 2013-07-01 DIAGNOSIS — E039 Hypothyroidism, unspecified: Secondary | ICD-10-CM

## 2013-07-01 DIAGNOSIS — Z0000 Encounter for general adult medical examination without abnormal findings: Secondary | ICD-10-CM

## 2013-07-01 DIAGNOSIS — I10 Essential (primary) hypertension: Secondary | ICD-10-CM

## 2013-07-01 DIAGNOSIS — M1990 Unspecified osteoarthritis, unspecified site: Secondary | ICD-10-CM

## 2013-07-01 MED ORDER — SILDENAFIL CITRATE 20 MG PO TABS: 60.0000 mg | ORAL_TABLET | Freq: Every day | ORAL | Status: DC | PRN

## 2013-07-01 MED ORDER — LEVOTHYROXINE SODIUM 75 MCG PO TABS: 75.0000 ug | ORAL_TABLET | Freq: Every day | ORAL | Status: DC

## 2013-07-01 NOTE — Assessment & Plan Note (Signed)
BP Readings from Last 3 Encounters:  07/01/13 146/94  06/17/13 128/80  06/29/12 150/90   Not optimal Discussed DASH diet and increased exercise No changes for now

## 2013-07-01 NOTE — Assessment & Plan Note (Signed)
Some in left hip No referral needed now

## 2013-07-01 NOTE — Patient Instructions (Signed)
DASH Diet  The DASH diet stands for "Dietary Approaches to Stop Hypertension." It is a healthy eating plan that has been shown to reduce high blood pressure (hypertension) in as little as 14 days, while also possibly providing other significant health benefits. These other health benefits include reducing the risk of breast cancer after menopause and reducing the risk of type 2 diabetes, heart disease, colon cancer, and stroke. Health benefits also include weight loss and slowing kidney failure in patients with chronic kidney disease.   DIET GUIDELINES  · Limit salt (sodium). Your diet should contain less than 1500 mg of sodium daily.  · Limit refined or processed carbohydrates. Your diet should include mostly whole grains. Desserts and added sugars should be used sparingly.  · Include small amounts of heart-healthy fats. These types of fats include nuts, oils, and tub margarine. Limit saturated and trans fats. These fats have been shown to be harmful in the body.  CHOOSING FOODS   The following food groups are based on a 2000 calorie diet. See your Registered Dietitian for individual calorie needs.  Grains and Grain Products (6 to 8 servings daily)  · Eat More Often: Whole-wheat bread, brown rice, whole-grain or wheat pasta, quinoa, popcorn without added fat or salt (air popped).  · Eat Less Often: White bread, white pasta, white rice, cornbread.  Vegetables (4 to 5 servings daily)  · Eat More Often: Fresh, frozen, and canned vegetables. Vegetables may be raw, steamed, roasted, or grilled with a minimal amount of fat.  · Eat Less Often/Avoid: Creamed or fried vegetables. Vegetables in a cheese sauce.  Fruit (4 to 5 servings daily)  · Eat More Often: All fresh, canned (in natural juice), or frozen fruits. Dried fruits without added sugar. One hundred percent fruit juice (½ cup [237 mL] daily).  · Eat Less Often: Dried fruits with added sugar. Canned fruit in light or heavy syrup.  Lean Meats, Fish, and Poultry (2  servings or less daily. One serving is 3 to 4 oz [85-114 g]).  · Eat More Often: Ninety percent or leaner ground beef, tenderloin, sirloin. Round cuts of beef, chicken breast, turkey breast. All fish. Grill, bake, or broil your meat. Nothing should be fried.  · Eat Less Often/Avoid: Fatty cuts of meat, turkey, or chicken leg, thigh, or wing. Fried cuts of meat or fish.  Dairy (2 to 3 servings)  · Eat More Often: Low-fat or fat-free milk, low-fat plain or light yogurt, reduced-fat or part-skim cheese.  · Eat Less Often/Avoid: Milk (whole, 2%). Whole milk yogurt. Full-fat cheeses.  Nuts, Seeds, and Legumes (4 to 5 servings per week)  · Eat More Often: All without added salt.  · Eat Less Often/Avoid: Salted nuts and seeds, canned beans with added salt.  Fats and Sweets (limited)  · Eat More Often: Vegetable oils, tub margarines without trans fats, sugar-free gelatin. Mayonnaise and salad dressings.  · Eat Less Often/Avoid: Coconut oils, palm oils, butter, stick margarine, cream, half and half, cookies, candy, pie.  FOR MORE INFORMATION  The Dash Diet Eating Plan: www.dashdiet.org  Document Released: 01/30/2011 Document Revised: 05/05/2011 Document Reviewed: 01/30/2011  [employer]® Patient Information ©2014 [employer].

## 2013-07-01 NOTE — Progress Notes (Signed)
Pre visit review using our clinic review tool, if applicable. No additional management support is needed unless otherwise documented below in the visit note. 

## 2013-07-01 NOTE — Assessment & Plan Note (Signed)
Generally doing well No PSA now since on Rx for prostatitis Needs to work more on fitness

## 2013-07-01 NOTE — Progress Notes (Signed)
Subjective:    Patient ID: Jason [name], male    DOB: 09-04-1955, 58 y.o.   MRN: [id number]  HPI Here for physical Urine/prostate symptoms better but not clear  Trying to exercise---did let it go some over the winter Weight is up a few pounds  Having pain in his left hip and back THR on right already done Feels like that now X-ray 3 years ago--some arthritis seen then No meds for this-- uses acetaminophen prn at times  Has ED still Has never tried meds--may be ready  Current Outpatient Prescriptions on File Prior to Visit  Medication Sig Dispense Refill  . acetaminophen (TYLENOL) 325 MG tablet Take 650 mg by mouth 2 (two) times daily.        . cyclobenzaprine (FLEXERIL) 10 MG tablet TAKE ONE-HALF TO ONE TABLET BY MOUTH TWICE DAILY AS NEEDED FOR MUSCLE SPASM  60 tablet  0  . doxycycline (ADOXA) 100 MG tablet Take 1 tablet (100 mg total) by mouth 2 (two) times daily.  42 tablet  0  . levothyroxine (SYNTHROID, LEVOTHROID) 75 MCG tablet TAKE ONE TABLET BY MOUTH EVERY DAY  90 tablet  3  . lisinopril-hydrochlorothiazide (PRINZIDE,ZESTORETIC) 20-25 MG per tablet TAKE ONE TABLET BY MOUTH ONCE DAILY  90 tablet  0  . loratadine (CLARITIN) 10 MG tablet Take 10 mg by mouth daily. Can take 2 tabs if needed       . Multiple Vitamin (MULTIVITAMIN) tablet Take 1 tablet by mouth daily.        . ranitidine (ZANTAC) 75 MG tablet Take 75 mg by mouth 2 (two) times daily.        No current facility-administered medications on file prior to visit.    No Known Allergies  Past Medical History  Diagnosis Date  . Allergy   . GERD (gastroesophageal reflux disease)   . Hypertension   . Arthritis     osteoarthritis  . Thyroid disease     hypo    Past Surgical History  Procedure Laterality Date  . Tonsillectomy    . Cardiac catheterization  03-2000  . 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 Rotator cuff repair  10/12    Dr [name] Heck    Family History  Problem Relation Age of Onset  . Gout  Father   . Arrhythmia Mother   . Hypertension Mother     History   Social History  . Marital Status: Married    Spouse Name: N/A    Number of Children: 0  . 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 Smoking status: Never Smoker   . Smokeless tobacco: Never Used  . Alcohol Use: Yes  . Drug Use: No  . Sexual Activity: Not on file   Other Topics Concern  . Not on file   Social History Narrative  . No narrative on file   Review of Systems  Constitutional: Positive for unexpected weight change. Negative for fatigue.       Wears seat belt Weight up and down  HENT: Negative for dental problem, hearing loss and tinnitus.        Regular with dentist  Eyes: Negative for visual disturbance.       No diplopia or unilateral vision loss  Respiratory: Negative for cough, chest tightness and shortness of breath.   Cardiovascular: Positive for palpitations. Negative for chest pain and leg swelling.       Slight  palpitation this AM--very brief (at rest)  Gastrointestinal: Negative for nausea, vomiting, abdominal pain, constipation and blood in stool.       Uses ranitidine prn for heartburn---works well  Endocrine: Negative for cold intolerance and heat intolerance.  Genitourinary: Positive for dysuria and difficulty urinating.       Urine better Discussed ED  Musculoskeletal: Positive for arthralgias and back pain. Negative for joint swelling.  Skin: Negative for rash.       No suspicious lesions  Allergic/Immunologic: Positive for environmental allergies. Negative for immunocompromised state.       Satisfied with loratadine  Neurological: Negative for dizziness, syncope, weakness, light-headedness, numbness and headaches.  Hematological: Negative for adenopathy. Does not bruise/bleed easily.  Psychiatric/Behavioral: Negative for sleep disturbance and dysphoric mood. The patient is not nervous/anxious.        Objective:     Physical Exam  Constitutional: He is oriented to person, place, and time. He appears well-developed and well-nourished. No distress.  HENT:  Head: Normocephalic and atraumatic.  Right Ear: External ear normal.  Left Ear: External ear normal.  Mouth/Throat: Oropharynx is clear and moist. No oropharyngeal exudate.  Eyes: Conjunctivae and EOM are normal. Pupils are equal, round, and reactive to light.  Neck: Normal range of motion. Neck supple. No thyromegaly present.  Cardiovascular: Normal rate, regular rhythm, normal heart sounds and intact distal pulses.  Exam reveals no gallop.   No murmur heard. Pulmonary/Chest: Effort normal and breath sounds normal. No respiratory distress. He has no wheezes. He has no rales.  Abdominal: Soft. There is no tenderness.  Musculoskeletal: He exhibits no edema and no tenderness.  Mild decreased internal rotation of left hip  Lymphadenopathy:    He has no cervical adenopathy.  Neurological: He is alert and oriented to person, place, and time.  Skin: No rash noted. No erythema.  Psychiatric: He has a normal mood and affect. His behavior is normal.          Assessment & Plan:

## 2013-07-01 NOTE — Assessment & Plan Note (Signed)
Seems euthyroid ?Will check labs ?

## 2013-07-02 ENCOUNTER — Telehealth: Payer: Self-pay | Admitting: Internal Medicine

## 2013-07-02 LAB — CBC WITH DIFFERENTIAL/PLATELET
BASOS: 1 %
Basophils Absolute: 0.1 10*3/uL (ref 0.0–0.2)
Eos: 2 %
Eosinophils Absolute: 0.1 10*3/uL (ref 0.0–0.4)
HCT: 45.3 % (ref 37.5–51.0)
HEMOGLOBIN: 15.1 g/dL (ref 12.6–17.7)
IMMATURE GRANS (ABS): 0 10*3/uL (ref 0.0–0.1)
Immature Granulocytes: 0 %
LYMPHS: 25 %
Lymphocytes Absolute: 1.5 10*3/uL (ref 0.7–3.1)
MCH: 31.3 pg (ref 26.6–33.0)
MCHC: 33.3 g/dL (ref 31.5–35.7)
MCV: 94 fL (ref 79–97)
MONOCYTES: 8 %
Monocytes Absolute: 0.5 10*3/uL (ref 0.1–0.9)
NEUTROS ABS: 3.8 10*3/uL (ref 1.4–7.0)
NEUTROS PCT: 64 %
RBC: 4.83 x10E6/uL (ref 4.14–5.80)
RDW: 13.4 % (ref 12.3–15.4)
WBC: 6 10*3/uL (ref 3.4–10.8)

## 2013-07-02 LAB — COMPREHENSIVE METABOLIC PANEL
ALT: 38 IU/L (ref 0–44)
AST: 49 IU/L — AB (ref 0–40)
Albumin/Globulin Ratio: 1.7 (ref 1.1–2.5)
Albumin: 4.6 g/dL (ref 3.5–5.5)
Alkaline Phosphatase: 62 IU/L (ref 39–117)
BUN/Creatinine Ratio: 15 (ref 9–20)
BUN: 13 mg/dL (ref 6–24)
CALCIUM: 9.6 mg/dL (ref 8.7–10.2)
CO2: 26 mmol/L (ref 18–29)
CREATININE: 0.89 mg/dL (ref 0.76–1.27)
Chloride: 99 mmol/L (ref 97–108)
GFR calc Af Amer: 111 mL/min/{1.73_m2} (ref >59)
GFR calc non Af Amer: 96 mL/min/{1.73_m2} (ref >59)
GLOBULIN, TOTAL: 2.7 g/dL (ref 1.5–4.5)
Glucose: 100 mg/dL — ABNORMAL HIGH (ref 65–99)
POTASSIUM: 4.6 mmol/L (ref 3.5–5.2)
Sodium: 140 mmol/L (ref 134–144)
Total Bilirubin: 1.2 mg/dL (ref 0.0–1.2)
Total Protein: 7.3 g/dL (ref 6.0–8.5)

## 2013-07-02 LAB — T4, FREE: FREE T4: 1 ng/dL (ref 0.82–1.77)

## 2013-07-02 LAB — TSH: TSH: 5.84 u[IU]/mL — AB (ref 0.450–4.500)

## 2013-07-02 NOTE — Telephone Encounter (Signed)
Relevant patient education mailed to patient.  

## 2013-07-04 ENCOUNTER — Encounter: Payer: Self-pay | Admitting: *Deleted

## 2013-07-26 ENCOUNTER — Other Ambulatory Visit: Payer: Self-pay | Admitting: Internal Medicine

## 2013-08-09 ENCOUNTER — Other Ambulatory Visit: Payer: Self-pay | Admitting: Internal Medicine

## 2013-09-27 ENCOUNTER — Other Ambulatory Visit: Payer: Self-pay | Admitting: Internal Medicine

## 2013-10-18 ENCOUNTER — Telehealth: Payer: Self-pay

## 2013-10-18 NOTE — Telephone Encounter (Signed)
Spoke with wife and pt will come by to get a weight and height check on Thursday.

## 2013-10-18 NOTE — Telephone Encounter (Signed)
I am okay with them faxing the form and we can put the height on it Please facilitate this for them

## 2013-10-18 NOTE — Telephone Encounter (Signed)
Mrs Campanaro left v/m; pt had health screening at [facility]; height equipment was broken; Mrs Mendolia wants to know how can get height listed on form; can form be faxed to office to get height listed. Pt seen 07/01/13; height was 5'10" at [facility]. Mrs Aiken request cb.

## 2013-10-27 ENCOUNTER — Encounter: Payer: Self-pay | Admitting: Gastroenterology

## 2013-10-27 ENCOUNTER — Encounter: Payer: Self-pay | Admitting: Internal Medicine

## 2013-10-27 ENCOUNTER — Ambulatory Visit (INDEPENDENT_AMBULATORY_CARE_PROVIDER_SITE_OTHER): Payer: BC Managed Care – PPO | Admitting: Internal Medicine

## 2013-10-27 VITALS — BP 140/88 | HR 68 | Temp 97.9°F | Wt 204.0 lb

## 2013-10-27 DIAGNOSIS — J019 Acute sinusitis, unspecified: Secondary | ICD-10-CM | POA: Insufficient documentation

## 2013-10-27 DIAGNOSIS — J018 Other acute sinusitis: Secondary | ICD-10-CM

## 2013-10-27 MED ORDER — AMOXICILLIN 500 MG PO TABS: 1000.0000 mg | ORAL_TABLET | Freq: Two times a day (BID) | ORAL | Status: DC

## 2013-10-27 NOTE — Patient Instructions (Signed)
Please start flonase or nasacort-- 2 sprays in each nostril twice a day for a week--then once a day till the end of the allergy season. Add cetirizine 10mg  daily or fexofenadine 180mg  at bedtime--continue the loratadine. If you start feeling sick---fever, more cough, drainage, etc---- start the amoxicillin antibiotic.

## 2013-10-27 NOTE — Progress Notes (Signed)
Pre visit review using our clinic review tool, if applicable. No additional management support is needed unless otherwise documented below in the visit note. 

## 2013-10-27 NOTE — Progress Notes (Signed)
   Subjective:    Patient ID: Jason [name], male    DOB: October 31, 1955, 58 y.o.   MRN: [id number]  HPI Having frontal headache--especially bad if he bends over Some cough--and this worsens it Mild nausea Making it hard to work  No fever Mild drainage--not really increased Some ear pressure No sore throat  Still on the loratadine 2 a day  Current Outpatient Prescriptions on File Prior to Visit  Medication Sig Dispense Refill  . acetaminophen (TYLENOL) 325 MG tablet Take 650 mg by mouth 2 (two) times daily.        . cyclobenzaprine (FLEXERIL) 10 MG tablet TAKE ONE-HALF TO ONE TABLET BY MOUTH TWICE DAILY AS NEEDED FOR MUSCLE SPASM  60 tablet  0  . levothyroxine (SYNTHROID, LEVOTHROID) 75 MCG tablet Take 1 tablet (75 mcg total) by mouth daily.  90 tablet  3  . lisinopril-hydrochlorothiazide (PRINZIDE,ZESTORETIC) 20-25 MG per tablet TAKE ONE TABLET BY MOUTH ONCE DAILY  90 tablet  0  . loratadine (CLARITIN) 10 MG tablet Take 10 mg by mouth daily. Can take 2 tabs if needed       . Multiple Vitamin (MULTIVITAMIN) tablet Take 1 tablet by mouth daily.        . ranitidine (ZANTAC) 75 MG tablet Take 75 mg by mouth 2 (two) times daily.       . sildenafil (REVATIO) 20 MG tablet Take 3-5 tablets (60-100 mg total) by mouth daily as needed.  50 tablet  11   No current facility-administered medications on file prior to visit.    No Known Allergies  Past Medical History  Diagnosis Date  . Allergy   . GERD (gastroesophageal reflux disease)   . Hypertension   . Arthritis     osteoarthritis  . Thyroid disease     hypo    Past Surgical History  Procedure Laterality Date  . Tonsillectomy    . Cardiac catheterization  03-2000  . 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 Rotator cuff repair  10/12    Dr [name] Heck    Family History  Problem Relation Age of Onset  . Gout Father   . Arrhythmia Mother   . Hypertension Mother     History   Social History  . Marital Status: Married    Spouse  Name: N/A    Number of Children: 0  . 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 Smoking status: Never Smoker   . Smokeless tobacco: Never Used  . Alcohol Use: Yes  . Drug Use: No  . Sexual Activity: Not on file   Other Topics Concern  . Not on file   Social History Narrative  . No narrative on file   Review of Systems No SOB Appetite is okay No diarrhea     Objective:   Physical Exam  Constitutional: He appears well-developed and well-nourished. No distress.  HENT:  Mouth/Throat: Oropharynx is clear and moist. No oropharyngeal exudate.  Mild frontal tenderness Moderate nasal inflammation TMs normal  Neck: Normal range of motion. Neck supple. No thyromegaly present.  Pulmonary/Chest: Effort normal and breath sounds normal. No respiratory distress. He has no wheezes. He has no rales.  Lymphadenopathy:    He has no cervical adenopathy.          Assessment & Plan:

## 2013-10-27 NOTE — Assessment & Plan Note (Signed)
I believe this is allergic Will add flonase or nasacort Try cetirizine or fexofenadine Amoxil for if he worsens

## 2013-11-07 ENCOUNTER — Encounter: Payer: Self-pay | Admitting: Internal Medicine

## 2013-11-07 ENCOUNTER — Ambulatory Visit (INDEPENDENT_AMBULATORY_CARE_PROVIDER_SITE_OTHER): Payer: BC Managed Care – PPO | Admitting: Internal Medicine

## 2013-11-07 VITALS — BP 130/80 | HR 70 | Temp 97.8°F | Wt 204.0 lb

## 2013-11-07 DIAGNOSIS — N410 Acute prostatitis: Secondary | ICD-10-CM

## 2013-11-07 DIAGNOSIS — R300 Dysuria: Secondary | ICD-10-CM

## 2013-11-07 DIAGNOSIS — N400 Enlarged prostate without lower urinary tract symptoms: Secondary | ICD-10-CM | POA: Insufficient documentation

## 2013-11-07 LAB — POCT URINALYSIS DIPSTICK
Bilirubin, UA: NEGATIVE
Blood, UA: NEGATIVE
Glucose, UA: NEGATIVE
Ketones, UA: NEGATIVE
LEUKOCYTES UA: NEGATIVE
Nitrite, UA: NEGATIVE
PH UA: 7
PROTEIN UA: NEGATIVE
Spec Grav, UA: 1.015
UROBILINOGEN UA: NEGATIVE

## 2013-11-07 MED ORDER — DOXYCYCLINE HYCLATE 100 MG PO TABS: 100.0000 mg | ORAL_TABLET | Freq: Two times a day (BID) | ORAL | Status: DC

## 2013-11-07 NOTE — Assessment & Plan Note (Addendum)
Similar to episode in April Will treat with 3 weeks of doxy again Call if symptoms persist If recurs again, would also treat with tamsulosin

## 2013-11-07 NOTE — Progress Notes (Signed)
Pre visit review using our clinic review tool, if applicable. No additional management support is needed unless otherwise documented below in the visit note. 

## 2013-11-07 NOTE — Progress Notes (Signed)
   Subjective:    Patient ID: Jason [name], male    DOB: 1955-10-17, 58 y.o.   MRN: [id number]  HPI Head and sinuses are better Didn't need the antibiotic  Having urinary problems Same type of symptoms as back in April  Burning dysuria 2 days ago--this is some better but he still feels some pressure Increased frequency No hematuria Last intercourse--no problems with ejaculation (no blood/pain,etc)  No fever No shakes or chills  Current Outpatient Prescriptions on File Prior to Visit  Medication Sig Dispense Refill  . acetaminophen (TYLENOL) 325 MG tablet Take 650 mg by mouth 2 (two) times daily.        . cyclobenzaprine (FLEXERIL) 10 MG tablet TAKE ONE-HALF TO ONE TABLET BY MOUTH TWICE DAILY AS NEEDED FOR MUSCLE SPASM  60 tablet  0  . levothyroxine (SYNTHROID, LEVOTHROID) 75 MCG tablet Take 1 tablet (75 mcg total) by mouth daily.  90 tablet  3  . lisinopril-hydrochlorothiazide (PRINZIDE,ZESTORETIC) 20-25 MG per tablet TAKE ONE TABLET BY MOUTH ONCE DAILY  90 tablet  0  . loratadine (CLARITIN) 10 MG tablet Take 10 mg by mouth daily. Can take 2 tabs if needed       . Multiple Vitamin (MULTIVITAMIN) tablet Take 1 tablet by mouth daily.        . ranitidine (ZANTAC) 75 MG tablet Take 75 mg by mouth 2 (two) times daily.       . sildenafil (REVATIO) 20 MG tablet Take 3-5 tablets (60-100 mg total) by mouth daily as needed.  50 tablet  11   No current facility-administered medications on file prior to visit.    No Known Allergies  Past Medical History  Diagnosis Date  . Allergy   . GERD (gastroesophageal reflux disease)   . Hypertension   . Arthritis     osteoarthritis  . Thyroid disease     hypo    Past Surgical History  Procedure Laterality Date  . Tonsillectomy    . Cardiac catheterization  03-2000  . 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 Rotator cuff repair  10/12    Dr [name] Heck    Family History  Problem Relation Age of Onset  . Gout Father   . Arrhythmia Mother     . Hypertension Mother     History   Social History  . Marital Status: Married    Spouse Name: N/A    Number of Children: 0  . 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 Smoking status: Never Smoker   . Smokeless tobacco: Never Used  . Alcohol Use: Yes  . Drug Use: No  . Sexual Activity: Not on file   Other Topics Concern  . Not on file   Social History Narrative  . No narrative on file   Review of Systems No constipation Appetite is fine    Objective:   Physical Exam  Constitutional: He appears well-developed and well-nourished. No distress.  Abdominal: Soft. He exhibits no distension. There is no tenderness. There is no rebound and no guarding.  Genitourinary:  Scrotum is quiet Prostate is mildly, symmetrically enlarged with moderate tenderness. No nodules          Assessment & Plan:

## 2013-11-10 ENCOUNTER — Other Ambulatory Visit: Payer: Self-pay | Admitting: Internal Medicine

## 2013-11-28 ENCOUNTER — Other Ambulatory Visit: Payer: Self-pay | Admitting: Internal Medicine

## 2013-11-28 NOTE — Telephone Encounter (Signed)
Okay #[phone] x 0 

## 2013-11-28 NOTE — Telephone Encounter (Signed)
Electronic Rx request for flexeril. Last filled 09/28/13 and last office visit (non acute) 07/01/13. Please advise.

## 2013-11-29 NOTE — Telephone Encounter (Signed)
Medication sent to pharmacy  

## 2014-01-27 ENCOUNTER — Other Ambulatory Visit: Payer: Self-pay | Admitting: Internal Medicine

## 2014-01-27 NOTE — Telephone Encounter (Signed)
Why does this keep coming back to me?  Was it sent in?

## 2014-01-27 NOTE — Telephone Encounter (Signed)
Last office visit 11/07/2013. Last refilled 11/29/2013 for #60 with no refills. Ok to refill?

## 2014-01-27 NOTE — Telephone Encounter (Signed)
Approved: #[phone] x 0 

## 2014-01-27 NOTE — Telephone Encounter (Signed)
Rx sent through [facility]  

## 2014-02-07 ENCOUNTER — Other Ambulatory Visit: Payer: Self-pay | Admitting: Internal Medicine

## 2014-03-23 ENCOUNTER — Ambulatory Visit (INDEPENDENT_AMBULATORY_CARE_PROVIDER_SITE_OTHER): Payer: BLUE CROSS/BLUE SHIELD | Admitting: Internal Medicine

## 2014-03-23 ENCOUNTER — Encounter: Payer: Self-pay | Admitting: Internal Medicine

## 2014-03-23 VITALS — BP 132/84 | HR 72 | Temp 97.8°F | Ht 70.0 in | Wt 208.9 lb

## 2014-03-23 DIAGNOSIS — N410 Acute prostatitis: Secondary | ICD-10-CM

## 2014-03-23 DIAGNOSIS — R300 Dysuria: Secondary | ICD-10-CM

## 2014-03-23 LAB — POCT URINALYSIS DIPSTICK
Bilirubin, UA: NEGATIVE
Blood, UA: NEGATIVE
Glucose, UA: 3.5
Ketones, UA: NEGATIVE
Leukocytes, UA: NEGATIVE
Nitrite, UA: NEGATIVE
PH UA: 7
PROTEIN UA: NEGATIVE
Spec Grav, UA: 1.015
Urobilinogen, UA: NEGATIVE

## 2014-03-23 MED ORDER — TAMSULOSIN HCL 0.4 MG PO CAPS: 0.4000 mg | ORAL_CAPSULE | Freq: Every day | ORAL | Status: DC

## 2014-03-23 MED ORDER — DOXYCYCLINE HYCLATE 100 MG PO TABS: 100.0000 mg | ORAL_TABLET | Freq: Two times a day (BID) | ORAL | Status: DC

## 2014-03-23 NOTE — Progress Notes (Signed)
   Subjective:    Patient ID: Jason [name], male    DOB: [birth year], 57 y.o.   MRN: [id number]  HPI Here for follow up on prostate problems Took the antibiotic for 6 weeks total and felt fine  2 days ago ---started with that prostate feeling again He gets irritability with the symptoms Notices it as some dysuria and pressure in penis with voiding Feels like he can't completely empty Some urgency  No fever No abdominal pain No blood or pain with ejaculate  Current Outpatient Prescriptions on File Prior to Visit  Medication Sig Dispense Refill  . acetaminophen (TYLENOL) 325 MG tablet Take 650 mg by mouth 2 (two) times daily.      . cetirizine (ZYRTEC) 10 MG tablet Take 10 mg by mouth daily.    . cyclobenzaprine (FLEXERIL) 10 MG tablet TAKE ONE-HALF TO ONE TABLET BY MOUTH TWICE DAILY AS NEEDED FOR MUSCLE SPASMS 60 tablet 0  . levothyroxine (SYNTHROID, LEVOTHROID) 75 MCG tablet Take 1 tablet (75 mcg total) by mouth daily. 90 tablet 3  . lisinopril-hydrochlorothiazide (PRINZIDE,ZESTORETIC) 20-25 MG per tablet TAKE ONE TABLET BY MOUTH ONCE DAILY 90 tablet 0  . loratadine (CLARITIN) 10 MG tablet Take 10 mg by mouth daily. Can take 2 tabs if needed     . Multiple Vitamin (MULTIVITAMIN) tablet Take 1 tablet by mouth daily.      . ranitidine (ZANTAC) 75 MG tablet Take 75 mg by mouth 2 (two) times daily.     . sildenafil (REVATIO) 20 MG tablet Take 3-5 tablets (60-100 mg total) by mouth daily as needed. 50 tablet 11   No current facility-administered medications on file prior to visit.    No Known Allergies  Past Medical History  Diagnosis Date  . Allergy   . GERD (gastroesophageal reflux disease)   . Hypertension   . Arthritis     osteoarthritis  . Thyroid disease     hypo    Past Surgical History  Procedure Laterality Date  . Tonsillectomy    . Cardiac catheterization  03-2000  . 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 Rotator cuff repair  10/12    Dr [name] Heck    Family  History  Problem Relation Age of Onset  . Gout Father   . Arrhythmia Mother   . Hypertension Mother     History   Social History  . Marital Status: Married    Spouse Name: N/A    Number of Children: 0  . 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 Smoking status: Never Smoker   . Smokeless tobacco: Never Used  . Alcohol Use: Yes  . Drug Use: No  . Sexual Activity: Not on file   Other Topics Concern  . Not on file   Social History Narrative   Review of Systems No N/V Appetite is okay Satisfied with sildenafil    Objective:   Physical Exam  Constitutional: He appears well-developed and well-nourished. No distress.  Genitourinary:  No scrotal swelling, inflammation or tenderness Urethra looks normal          Assessment & Plan:

## 2014-03-23 NOTE — Progress Notes (Signed)
Pre visit review using our clinic review tool, if applicable. No additional management support is needed unless otherwise documented below in the visit note. 

## 2014-03-23 NOTE — Patient Instructions (Signed)
Take the doxycycline again for 3 weeks. Let me know if the symptoms are not gone within the next week. Please start the tamsulosin and stay on it. Hopefully this will keep you from getting another infection.

## 2014-03-23 NOTE — Assessment & Plan Note (Signed)
Resolved and did well for a few months but now recurred. Will retreat with the doxy Add tamsulosin If recurrences persist, will send to urology

## 2014-03-28 ENCOUNTER — Other Ambulatory Visit: Payer: Self-pay | Admitting: Internal Medicine

## 2014-03-28 NOTE — Telephone Encounter (Signed)
Last filled 01/27/14--last OV 03/23/14--please advise

## 2014-03-29 NOTE — Telephone Encounter (Signed)
Rx sent through [facility]  

## 2014-03-29 NOTE — Telephone Encounter (Signed)
Approved: #[phone] x 1 

## 2014-05-04 ENCOUNTER — Other Ambulatory Visit: Payer: Self-pay | Admitting: Internal Medicine

## 2014-05-30 ENCOUNTER — Ambulatory Visit (INDEPENDENT_AMBULATORY_CARE_PROVIDER_SITE_OTHER): Payer: BLUE CROSS/BLUE SHIELD | Admitting: Internal Medicine

## 2014-05-30 ENCOUNTER — Encounter: Payer: Self-pay | Admitting: Internal Medicine

## 2014-05-30 VITALS — BP 148/80 | HR 80 | Temp 98.6°F | Wt 210.0 lb

## 2014-05-30 DIAGNOSIS — N410 Acute prostatitis: Secondary | ICD-10-CM | POA: Diagnosis not present

## 2014-05-30 MED ORDER — CIPROFLOXACIN HCL 500 MG PO TABS: 500.0000 mg | ORAL_TABLET | Freq: Two times a day (BID) | ORAL | Status: DC

## 2014-05-30 NOTE — Progress Notes (Signed)
Pre visit review using our clinic review tool, if applicable. No additional management support is needed unless otherwise documented below in the visit note. 

## 2014-05-30 NOTE — Progress Notes (Signed)
   Subjective:    Patient ID: Jason [name], male    DOB: [birth year], 57 y.o.   MRN: [id number]  HPI Felt better for a while Took the antibiotic for just 3 weeks in January--had felt better so didn't go 6 weeks  Symptoms recurred 3/14 or so Refilled the antibiotic Then as he was finishing this--got increased frequency and chill  Has kept up on the tamsulosin Yesterday was last dose of the antibiotic  Slight  dysuria and seems to have trouble emptying properly No hematuria or apparent hematospermia No pain with sex  Current Outpatient Prescriptions on File Prior to Visit  Medication Sig Dispense Refill  . acetaminophen (TYLENOL) 325 MG tablet Take 650 mg by mouth 2 (two) times daily.      . cetirizine (ZYRTEC) 10 MG tablet Take 10 mg by mouth daily.    . cyclobenzaprine (FLEXERIL) 10 MG tablet TAKE ONE-HALF TO ONE TABLET BY MOUTH TWICE DAILY AS NEEDED FOR MUSCLE SPASM 60 tablet 1  . levothyroxine (SYNTHROID, LEVOTHROID) 75 MCG tablet Take 1 tablet (75 mcg total) by mouth daily. 90 tablet 3  . lisinopril-hydrochlorothiazide (PRINZIDE,ZESTORETIC) 20-25 MG per tablet TAKE ONE TABLET BY MOUTH ONCE DAILY 90 tablet 3  . loratadine (CLARITIN) 10 MG tablet Take 10 mg by mouth daily. Can take 2 tabs if needed     . Multiple Vitamin (MULTIVITAMIN) tablet Take 1 tablet by mouth daily.      . ranitidine (ZANTAC) 75 MG tablet Take 75 mg by mouth 2 (two) times daily.     . sildenafil (REVATIO) 20 MG tablet Take 3-5 tablets (60-100 mg total) by mouth daily as needed. 50 tablet 11  . tamsulosin (FLOMAX) 0.4 MG CAPS capsule Take 1 capsule (0.4 mg total) by mouth daily. 90 capsule 3   No current facility-administered medications on file prior to visit.    No Known Allergies  Past Medical History  Diagnosis Date  . Allergy   . GERD (gastroesophageal reflux disease)   . Hypertension   . Arthritis     osteoarthritis  . Thyroid disease     hypo    Past Surgical History  Procedure  Laterality Date  . Tonsillectomy    . Cardiac catheterization  03-2000  . 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 Rotator cuff repair  10/12    Dr [name] Heck    Family History  Problem Relation Age of Onset  . Gout Father   . Arrhythmia Mother   . Hypertension Mother     History   Social History  . Marital Status: Married    Spouse Name: N/A  . Number of Children: 0  . 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 Smoking status: Never Smoker   . Smokeless tobacco: Never Used  . Alcohol Use: Yes  . Drug Use: No  . Sexual Activity: Not on file   Other Topics Concern  . Not on file   Social History Narrative   Review of Systems No sweats No N/V Appetite is okay    Objective:   Physical Exam  Constitutional: He appears well-developed and well-nourished.  Abdominal: Soft. Bowel sounds are normal. There is no tenderness.  Genitourinary:  No scrotal or testicular swelling or tenderness          Assessment & Plan:

## 2014-05-30 NOTE — Patient Instructions (Signed)
Take the full 6 weeks of the antibiotic (cipro) and stay on the tamsulosin. If your symptoms aren't gone within a month, or they recur after the antibiotic is done, let me know and I will set up an appointment with the urologist.

## 2014-05-30 NOTE — Assessment & Plan Note (Signed)
Has had a number of recurrences  Has only had doxy (which has worked in past) Will try cipro for 6 weeks---if ongoing problems will set up at [facility]

## 2014-06-21 ENCOUNTER — Other Ambulatory Visit: Payer: Self-pay | Admitting: *Deleted

## 2014-06-21 MED ORDER — LEVOTHYROXINE SODIUM 75 MCG PO TABS: 75.0000 ug | ORAL_TABLET | Freq: Every day | ORAL | Status: DC

## 2014-06-21 MED ORDER — TAMSULOSIN HCL 0.4 MG PO CAPS: 0.4000 mg | ORAL_CAPSULE | Freq: Every day | ORAL | Status: DC

## 2014-06-21 NOTE — Telephone Encounter (Signed)
Rx's previously filled at [facility].  Patient now has [employer] which requires all medications to be filled at [facility] or [facility] in order to be covered.  Rx's sent to [facility] in [place].

## 2014-07-28 ENCOUNTER — Other Ambulatory Visit: Payer: Self-pay

## 2014-07-28 MED ORDER — CYCLOBENZAPRINE HCL 10 MG PO TABS: ORAL_TABLET | ORAL | Status: DC

## 2014-07-28 NOTE — Telephone Encounter (Signed)
Approved: okay #[phone] x 1

## 2014-07-28 NOTE — Telephone Encounter (Signed)
Pt left v/m; pt has change in ins coverage; now required to get 90 day rx to get ins benefits; pt [facility] [place].Please advise. Have not changed quantity of rx until approved by Dr Silvio Pate. 07/01/13 last annual exam.appt scheduled 08/18/14 for [facility].

## 2014-07-28 NOTE — Telephone Encounter (Signed)
rx sent to pharmacy by e-script  

## 2014-08-07 ENCOUNTER — Telehealth: Payer: Self-pay

## 2014-08-07 NOTE — Telephone Encounter (Signed)
Pt left v/m requesting refill lisinopril HCTZ [facility]; left v/m for pt to have [facility] request transfer of refills from [address].

## 2014-08-18 ENCOUNTER — Encounter: Payer: Self-pay | Admitting: Internal Medicine

## 2014-08-18 ENCOUNTER — Ambulatory Visit (INDEPENDENT_AMBULATORY_CARE_PROVIDER_SITE_OTHER): Payer: BLUE CROSS/BLUE SHIELD | Admitting: Internal Medicine

## 2014-08-18 VITALS — BP 128/70 | HR 68 | Temp 98.2°F | Ht 70.0 in | Wt 207.0 lb

## 2014-08-18 DIAGNOSIS — E039 Hypothyroidism, unspecified: Secondary | ICD-10-CM

## 2014-08-18 DIAGNOSIS — M545 Low back pain, unspecified: Secondary | ICD-10-CM

## 2014-08-18 DIAGNOSIS — I10 Essential (primary) hypertension: Secondary | ICD-10-CM

## 2014-08-18 DIAGNOSIS — Z0000 Encounter for general adult medical examination without abnormal findings: Secondary | ICD-10-CM | POA: Diagnosis not present

## 2014-08-18 MED ORDER — SILDENAFIL CITRATE 20 MG PO TABS: 60.0000 mg | ORAL_TABLET | Freq: Every day | ORAL | Status: DC | PRN

## 2014-08-18 NOTE — Progress Notes (Signed)
Subjective:    Patient ID: Jason [name], male    DOB: Dec 26, 1955, 59 y.o.   MRN: [id number]  HPI Here for physical  Prostate did finally settle down Staying on tamsulosin  Back pain quiet Takes it every day at bedtime--helps him sleep (otherwise he will gets cramps) No AM sedation or grogginess  Doesn't check BP No apparent problems  Current Outpatient Prescriptions on File Prior to Visit  Medication Sig Dispense Refill  . acetaminophen (TYLENOL) 325 MG tablet Take 650 mg by mouth 2 (two) times daily.      . cetirizine (ZYRTEC) 10 MG tablet Take 10 mg by mouth daily.    . cyclobenzaprine (FLEXERIL) 10 MG tablet TAKE ONE-HALF TO ONE TABLET BY MOUTH TWICE DAILY AS NEEDED FOR MUSCLE SPASM 180 tablet 1  . levothyroxine (SYNTHROID, LEVOTHROID) 75 MCG tablet Take 1 tablet (75 mcg total) by mouth daily. 90 tablet 2  . lisinopril-hydrochlorothiazide (PRINZIDE,ZESTORETIC) 20-25 MG per tablet TAKE ONE TABLET BY MOUTH ONCE DAILY 90 tablet 3  . loratadine (CLARITIN) 10 MG tablet Take 10 mg by mouth daily. Can take 2 tabs if needed     . Multiple Vitamin (MULTIVITAMIN) tablet Take 1 tablet by mouth daily.      . ranitidine (ZANTAC) 75 MG tablet Take 75 mg by mouth 2 (two) times daily.     . tamsulosin (FLOMAX) 0.4 MG CAPS capsule Take 1 capsule (0.4 mg total) by mouth daily. 90 capsule 2   No current facility-administered medications on file prior to visit.    No Known Allergies  Past Medical History  Diagnosis Date  . Allergy   . GERD (gastroesophageal reflux disease)   . Hypertension   . Arthritis     osteoarthritis  . Thyroid disease     hypo    Past Surgical History  Procedure Laterality Date  . Tonsillectomy    . Cardiac catheterization  03-2000  . 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 Rotator cuff repair  10/12    Dr [name] Heck    Family History  Problem Relation Age of Onset  . Gout Father   . Arrhythmia Mother   . Hypertension Mother     History   Social  History  . Marital Status: Married    Spouse Name: N/A  . Number of Children: 0  . 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 Smoking status: Never Smoker   . Smokeless tobacco: Never Used  . Alcohol Use: Yes  . Drug Use: No  . Sexual Activity: Not on file   Other Topics Concern  . Not on file   Social History Narrative   Review of Systems  Constitutional: Negative for fatigue and unexpected weight change.       Not much exercise Wears seat belt  HENT: Negative for dental problem, hearing loss, tinnitus and trouble swallowing.        Keeps up with dentist  Eyes: Negative for visual disturbance.       No diplopia or unilateral vision loss  Respiratory: Negative for cough, chest tightness and shortness of breath.   Cardiovascular: Negative for chest pain, palpitations and leg swelling.  Gastrointestinal: Negative for nausea, vomiting, abdominal pain, constipation and blood in stool.       Rare heartburn---hasn't needed prn ranitidine lately  Endocrine: Negative for polydipsia and polyuria.  Genitourinary: Negative for urgency, frequency and difficulty urinating.  No sexual problems  Musculoskeletal: Positive for back pain and arthralgias. Negative for joint swelling.        Back and left hip pain  Skin: Negative for rash.       No suspicious lesions--has has spot to check in lower right leg (wife noticed)  Allergic/Immunologic: Positive for environmental allergies. Negative for immunocompromised state.  Hematological: Negative for adenopathy. Does not bruise/bleed easily.  Psychiatric/Behavioral: Negative for sleep disturbance and dysphoric mood. The patient is not nervous/anxious.        Objective:   Physical Exam  Constitutional: He is oriented to person, place, and time. He appears well-developed and well-nourished. No distress.  HENT:  Head: Normocephalic and atraumatic.  Right Ear: External ear normal.   Left Ear: External ear normal.  Mouth/Throat: Oropharynx is clear and moist. No oropharyngeal exudate.  Eyes: Conjunctivae and EOM are normal. Pupils are equal, round, and reactive to light.  Neck: Normal range of motion. Neck supple. No thyromegaly present.  Cardiovascular: Normal rate, regular rhythm, normal heart sounds and intact distal pulses.  Exam reveals no gallop.   No murmur heard. Pulmonary/Chest: Effort normal and breath sounds normal. No respiratory distress. He has no wheezes. He has no rales.  Abdominal: Soft. He exhibits no distension. There is no tenderness. There is no rebound and no guarding.  Musculoskeletal: He exhibits no edema or tenderness.  Lymphadenopathy:    He has no cervical adenopathy.  Neurological: He is alert and oriented to person, place, and time.  Skin:  Apparent contusion on lower right calf  Psychiatric: He has a normal mood and affect. His behavior is normal.          Assessment & Plan:

## 2014-08-18 NOTE — Assessment & Plan Note (Signed)
Chronic pain Does well with the night cyclobenzaprine

## 2014-08-18 NOTE — Assessment & Plan Note (Signed)
BP Readings from Last 3 Encounters:  08/18/14 128/70  05/30/14 148/80  03/23/14 132/84   Good control No changes needed

## 2014-08-18 NOTE — Progress Notes (Signed)
Pre visit review using our clinic review tool, if applicable. No additional management support is needed unless otherwise documented below in the visit note. 

## 2014-08-18 NOTE — Assessment & Plan Note (Signed)
Generally healthy Plans to pick up on exercise Colon due 2020 Will check PSA after discussion---may still be falsely elevated from prostatitis. If high will just repeat

## 2014-08-18 NOTE — Assessment & Plan Note (Signed)
Clinically euthyroid Will check labs 

## 2014-08-19 LAB — CBC WITH DIFFERENTIAL/PLATELET
Basophils Absolute: 0 10*3/uL (ref 0.0–0.2)
Basos: 1 %
EOS (ABSOLUTE): 0.1 10*3/uL (ref 0.0–0.4)
EOS: 2 %
HEMOGLOBIN: 15.2 g/dL (ref 12.6–17.7)
Hematocrit: 45.4 % (ref 37.5–51.0)
Immature Grans (Abs): 0 10*3/uL (ref 0.0–0.1)
Immature Granulocytes: 0 %
Lymphocytes Absolute: 1.6 10*3/uL (ref 0.7–3.1)
Lymphs: 20 %
MCH: 31.3 pg (ref 26.6–33.0)
MCHC: 33.5 g/dL (ref 31.5–35.7)
MCV: 94 fL (ref 79–97)
MONOCYTES: 7 %
Monocytes Absolute: 0.6 10*3/uL (ref 0.1–0.9)
NEUTROS PCT: 70 %
Neutrophils Absolute: 5.8 10*3/uL (ref 1.4–7.0)
Platelets: 292 10*3/uL (ref 150–379)
RBC: 4.85 x10E6/uL (ref 4.14–5.80)
RDW: 13.4 % (ref 12.3–15.4)
WBC: 8.2 10*3/uL (ref 3.4–10.8)

## 2014-08-19 LAB — LIPID PANEL
CHOLESTEROL TOTAL: 152 mg/dL (ref 100–199)
Chol/HDL Ratio: 2.8 ratio units (ref 0.0–5.0)
HDL: 54 mg/dL (ref >39)
LDL Calculated: 88 mg/dL (ref 0–99)
TRIGLYCERIDES: 49 mg/dL (ref 0–149)
VLDL Cholesterol Cal: 10 mg/dL (ref 5–40)

## 2014-08-19 LAB — COMPREHENSIVE METABOLIC PANEL
ALK PHOS: 67 IU/L (ref 39–117)
ALT: 33 IU/L (ref 0–44)
AST: 35 IU/L (ref 0–40)
Albumin/Globulin Ratio: 1.8 (ref 1.1–2.5)
Albumin: 4.7 g/dL (ref 3.5–5.5)
BUN/Creatinine Ratio: 13 (ref 9–20)
BUN: 11 mg/dL (ref 6–24)
Bilirubin Total: 1.5 mg/dL — ABNORMAL HIGH (ref 0.0–1.2)
CALCIUM: 9.7 mg/dL (ref 8.7–10.2)
CO2: 27 mmol/L (ref 18–29)
Chloride: 98 mmol/L (ref 97–108)
Creatinine, Ser: 0.85 mg/dL (ref 0.76–1.27)
GFR calc Af Amer: 113 mL/min/{1.73_m2} (ref >59)
GFR calc non Af Amer: 97 mL/min/{1.73_m2} (ref >59)
Globulin, Total: 2.6 g/dL (ref 1.5–4.5)
Glucose: 86 mg/dL (ref 65–99)
POTASSIUM: 4.3 mmol/L (ref 3.5–5.2)
Sodium: 143 mmol/L (ref 134–144)
Total Protein: 7.3 g/dL (ref 6.0–8.5)

## 2014-08-19 LAB — TSH: TSH: 6.37 u[IU]/mL — ABNORMAL HIGH (ref 0.450–4.500)

## 2014-08-19 LAB — PSA: PROSTATE SPECIFIC AG, SERUM: 0.9 ng/mL (ref 0.0–4.0)

## 2014-08-19 LAB — T4, FREE: Free T4: 0.95 ng/dL (ref 0.82–1.77)

## 2014-08-22 ENCOUNTER — Encounter: Payer: Self-pay | Admitting: *Deleted

## 2015-03-18 ENCOUNTER — Other Ambulatory Visit: Payer: Self-pay | Admitting: Internal Medicine

## 2015-05-03 ENCOUNTER — Other Ambulatory Visit: Payer: Self-pay | Admitting: Internal Medicine

## 2015-07-22 ENCOUNTER — Other Ambulatory Visit: Payer: Self-pay | Admitting: Internal Medicine

## 2015-07-24 NOTE — Telephone Encounter (Signed)
Last filled 01-25-15 #180 Last OV 08-18-14 Next OV 08-21-15

## 2015-07-24 NOTE — Telephone Encounter (Signed)
Approved:  #[id number] x 0 

## 2015-07-26 ENCOUNTER — Ambulatory Visit (INDEPENDENT_AMBULATORY_CARE_PROVIDER_SITE_OTHER): Payer: BLUE CROSS/BLUE SHIELD | Admitting: Primary Care

## 2015-07-26 ENCOUNTER — Encounter: Payer: Self-pay | Admitting: Primary Care

## 2015-07-26 VITALS — BP 140/88 | HR 70 | Temp 97.7°F | Ht 70.0 in | Wt 210.8 lb

## 2015-07-26 DIAGNOSIS — R05 Cough: Secondary | ICD-10-CM | POA: Diagnosis not present

## 2015-07-26 DIAGNOSIS — R059 Cough, unspecified: Secondary | ICD-10-CM

## 2015-07-26 MED ORDER — AMOXICILLIN 875 MG PO TABS: 875.0000 mg | ORAL_TABLET | Freq: Two times a day (BID) | ORAL | Status: DC

## 2015-07-26 NOTE — Progress Notes (Signed)
Pre visit review using our clinic review tool, if applicable. No additional management support is needed unless otherwise documented below in the visit note. 

## 2015-07-26 NOTE — Patient Instructions (Signed)
Start amoxicillin antibiotics. Take 1 tablet by mouth twice daily for 7 days.  Start Mucinex to help break up the mucous in your chest.  Continue Claritin/Zyrtec as dicussed.  Try Delsym for cough. This may be purchased over the counter.  Ensure you are staying hydrated with water.  It was a pleasure meeting you!

## 2015-07-26 NOTE — Progress Notes (Signed)
Subjective:    Patient ID: Jason [name], male    DOB: 1955-05-13, 60 y.o.   MRN: [id number]  HPI  Jason [name] is a 60 year old male with a history of allergic rhinitis who presents today with a chief complaint of cough. He also reports sore throat, ear fullness. His symptoms have been present consistently for the past 3 weeks. He was ill in April and never felt as though he really recovered. His cough is productive with dark yellow/green sputum. Overall he's experienced no improvement. He's taken Robitussin, Nyquil, Dayquil, Claritin, Zyrtec with only temporary improvement. No sick contacts.   Review of Systems  Constitutional: Positive for chills and fatigue. Negative for fever.  HENT: Positive for congestion, ear pain and sore throat. Negative for sinus pressure.   Respiratory: Positive for cough. Negative for shortness of breath.   Cardiovascular: Negative for chest pain.       Past Medical History  Diagnosis Date  . Allergy   . GERD (gastroesophageal reflux disease)   . Hypertension   . Arthritis     osteoarthritis  . Thyroid disease     hypo     Social History   Social History  . Marital Status: Married    Spouse Name: N/A  . Number of Children: 0  . 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 Smoking status: Never Smoker   . Smokeless tobacco: Never Used  . Alcohol Use: Yes  . Drug Use: No  . Sexual Activity: Not on file   Other Topics Concern  . Not on file   Social History Narrative    Past Surgical History  Procedure Laterality Date  . Tonsillectomy    . Cardiac catheterization  03-2000  . 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 Rotator cuff repair  10/12    Dr [name] Heck    Family History  Problem Relation Age of Onset  . Gout Father   . Arrhythmia Mother   . Hypertension Mother     No Known Allergies  Current Outpatient Prescriptions on File Prior to Visit  Medication Sig Dispense  Refill  . acetaminophen (TYLENOL) 325 MG tablet Take 650 mg by mouth 2 (two) times daily.      . cetirizine (ZYRTEC) 10 MG tablet Take 10 mg by mouth daily.    . cyclobenzaprine (FLEXERIL) 10 MG tablet TAKE ONE-HALF TO ONE TABLET BY MOUTH TWICE DAILY AS NEEDED FOR MUSCLE SPASM 180 tablet 0  . levothyroxine (SYNTHROID, LEVOTHROID) 75 MCG tablet TAKE 1 TABLET (75 MCG TOTAL) BY MOUTH DAILY. 90 tablet 2  . lisinopril-hydrochlorothiazide (PRINZIDE,ZESTORETIC) 20-25 MG tablet TAKE ONE TABLET BY MOUTH ONCE DAILY 90 tablet 1  . loratadine (CLARITIN) 10 MG tablet Take 10 mg by mouth daily. Can take 2 tabs if needed     . Multiple Vitamin (MULTIVITAMIN) tablet Take 1 tablet by mouth daily.      . ranitidine (ZANTAC) 75 MG tablet Take 75 mg by mouth 2 (two) times daily.     . sildenafil (REVATIO) 20 MG tablet Take 3-5 tablets (60-100 mg total) by mouth daily as needed. 50 tablet 11  . tamsulosin (FLOMAX) 0.4 MG CAPS capsule TAKE 1 CAPSULE (0.4 MG TOTAL) BY MOUTH DAILY. 90 capsule 2   No current facility-administered medications on file prior to visit.    BP 140/88 mmHg  Pulse 70  Temp(Src) 97.7 F (36.5 C) (Oral)  Ht 5'  10" (1.778 m)  Wt 210 lb 12.8 oz (95.618 kg)  BMI 30.25 kg/m2  SpO2 99%    Objective:   Physical Exam  Constitutional: He appears well-nourished.  HENT:  Right Ear: Tympanic membrane and ear canal normal.  Left Ear: Tympanic membrane and ear canal normal.  Nose: No mucosal edema. Right sinus exhibits no maxillary sinus tenderness and no frontal sinus tenderness. Left sinus exhibits no maxillary sinus tenderness and no frontal sinus tenderness.  Mouth/Throat: Oropharynx is clear and moist.  Eyes: Conjunctivae are normal.  Neck: Neck supple.  Cardiovascular: Normal rate and regular rhythm.   Pulmonary/Chest: Effort normal. He has no decreased breath sounds. He has no wheezes. He has rhonchi in the right lower field and the left lower field.  Skin: Skin is warm and dry.           Assessment & Plan:  Cough:  Present consistently with chest congestion x 3 weeks. Sore throat likely due to cough and PND. Exam today with moderate rhonchi to bilateral lung bases, he is a non smoker. Given duration and examination will treat with antibiotics. Rx for Amoxil course sent to pharmacy.  Start Mucinex, continue antihistamine, start Delsym. Fluids, rest, return precautions provided.

## 2015-08-21 ENCOUNTER — Encounter: Payer: Self-pay | Admitting: Internal Medicine

## 2015-08-21 ENCOUNTER — Ambulatory Visit (INDEPENDENT_AMBULATORY_CARE_PROVIDER_SITE_OTHER): Payer: BLUE CROSS/BLUE SHIELD | Admitting: Internal Medicine

## 2015-08-21 VITALS — BP 128/88 | HR 70 | Temp 98.3°F | Ht 69.0 in | Wt 207.0 lb

## 2015-08-21 DIAGNOSIS — I10 Essential (primary) hypertension: Secondary | ICD-10-CM | POA: Diagnosis not present

## 2015-08-21 DIAGNOSIS — E039 Hypothyroidism, unspecified: Secondary | ICD-10-CM | POA: Diagnosis not present

## 2015-08-21 DIAGNOSIS — N400 Enlarged prostate without lower urinary tract symptoms: Secondary | ICD-10-CM | POA: Diagnosis not present

## 2015-08-21 DIAGNOSIS — Z23 Encounter for immunization: Secondary | ICD-10-CM

## 2015-08-21 DIAGNOSIS — Z1159 Encounter for screening for other viral diseases: Secondary | ICD-10-CM | POA: Diagnosis not present

## 2015-08-21 DIAGNOSIS — Z0000 Encounter for general adult medical examination without abnormal findings: Secondary | ICD-10-CM | POA: Diagnosis not present

## 2015-08-21 MED ORDER — SILDENAFIL CITRATE 20 MG PO TABS: 60.0000 mg | ORAL_TABLET | Freq: Every day | ORAL | Status: DC | PRN

## 2015-08-21 NOTE — Assessment & Plan Note (Signed)
BP Readings from Last 3 Encounters:  08/21/15 128/88  07/26/15 140/88  08/18/14 128/70   Doing well on Rx

## 2015-08-21 NOTE — Progress Notes (Signed)
Pre visit review using our clinic review tool, if applicable. No additional management support is needed unless otherwise documented below in the visit note. 

## 2015-08-21 NOTE — Assessment & Plan Note (Signed)
Doing well Will update Tdap Colon due 2020 Will defer PSA till at least next year

## 2015-08-21 NOTE — Assessment & Plan Note (Signed)
tamsulosin initially with episode of prostatitis Now helping him void easier

## 2015-08-21 NOTE — Assessment & Plan Note (Signed)
Seems euthyroid Due for labs 

## 2015-08-21 NOTE — Addendum Note (Signed)
Addended by: Pilar Grammes on: 08/21/2015 11:33 AM   Modules accepted: Orders, SmartSet

## 2015-08-21 NOTE — Progress Notes (Signed)
Subjective:    Patient ID: Jason [name], male    DOB: 18-Dec-1955, 60 y.o.   MRN: [id number]  HPI Here for physical  He feels his cough is better--amoxil did seem to help Still feels his throat is still bothering him--but wonders if it is his neck Seems more noticeable at work No swallowing  No heartburn--rarely needs the ranitidine Allergies mostly controlled  Still gets back symptoms Still careful with lifting, etc  Still on tamsulosin Helps his voiding No recurrent prostatitis symptoms  Current Outpatient Prescriptions on File Prior to Visit  Medication Sig Dispense Refill  . acetaminophen (TYLENOL) 325 MG tablet Take 650 mg by mouth 2 (two) times daily.      . cetirizine (ZYRTEC) 10 MG tablet Take 10 mg by mouth daily.    . cyclobenzaprine (FLEXERIL) 10 MG tablet TAKE ONE-HALF TO ONE TABLET BY MOUTH TWICE DAILY AS NEEDED FOR MUSCLE SPASM 180 tablet 0  . levothyroxine (SYNTHROID, LEVOTHROID) 75 MCG tablet TAKE 1 TABLET (75 MCG TOTAL) BY MOUTH DAILY. 90 tablet 2  . lisinopril-hydrochlorothiazide (PRINZIDE,ZESTORETIC) 20-25 MG tablet TAKE ONE TABLET BY MOUTH ONCE DAILY 90 tablet 1  . loratadine (CLARITIN) 10 MG tablet Take 10 mg by mouth daily. Can take 2 tabs if needed     . Multiple Vitamin (MULTIVITAMIN) tablet Take 1 tablet by mouth daily.      . ranitidine (ZANTAC) 75 MG tablet Take 75 mg by mouth 2 (two) times daily.     . sildenafil (REVATIO) 20 MG tablet Take 3-5 tablets (60-100 mg total) by mouth daily as needed. 50 tablet 11  . tamsulosin (FLOMAX) 0.4 MG CAPS capsule TAKE 1 CAPSULE (0.4 MG TOTAL) BY MOUTH DAILY. 90 capsule 2   No current facility-administered medications on file prior to visit.    No Known Allergies  Past Medical History  Diagnosis Date  . Allergy   . GERD (gastroesophageal reflux disease)   . Hypertension   . Arthritis     osteoarthritis  . Thyroid disease     hypo    Past Surgical History  Procedure Laterality Date  .  Tonsillectomy    . Cardiac catheterization  03-2000  . 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 Rotator cuff repair  10/12    Dr [name] Heck    Family History  Problem Relation Age of Onset  . Gout Father   . Arrhythmia Mother   . Hypertension Mother     Social History   Social History  . Marital Status: Married    Spouse Name: N/A  . Number of Children: 0  . 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 Smoking status: Never Smoker   . Smokeless tobacco: Never Used  . Alcohol Use: Yes  . Drug Use: No  . Sexual Activity: Not on file   Other Topics Concern  . Not on file   Social History Narrative   Review of Systems  Constitutional: Negative for fatigue and unexpected weight change.       Does try to exercise Wears seat belt  HENT: Negative for hearing loss and tinnitus.        Keeps up with dentist  Eyes: Negative for visual disturbance.       No diplopia or unilateral vision loss  Respiratory: Negative for cough, chest tightness and shortness of breath.   Cardiovascular: Negative for chest pain, palpitations and leg swelling.  Gastrointestinal: Negative  for nausea, vomiting, abdominal pain, constipation and blood in stool.  Endocrine: Negative for polydipsia and polyuria.  Genitourinary: Negative for urgency, frequency and difficulty urinating.       No sexual problems  Musculoskeletal: Positive for back pain and arthralgias. Negative for joint swelling.       Some hip pain at times-- rare acetaminophen  Skin: Negative for rash.       No suspicious lesions  Allergic/Immunologic: Positive for environmental allergies. Negative for immunocompromised state.  Neurological: Negative for syncope and light-headedness.       Rare headaches  Hematological: Negative for adenopathy. Does not bruise/bleed easily.  Psychiatric/Behavioral: Negative for sleep disturbance and dysphoric mood. The patient is not nervous/anxious.         Objective:   Physical Exam  Constitutional: He is oriented to person, place, and time. He appears well-developed and well-nourished. No distress.  HENT:  Head: Normocephalic and atraumatic.  Right Ear: External ear normal.  Left Ear: External ear normal.  Mouth/Throat: Oropharynx is clear and moist. No oropharyngeal exudate.  Eyes: Conjunctivae are normal. Pupils are equal, round, and reactive to light.  Neck: Normal range of motion. Neck supple. No thyromegaly present.  Cardiovascular: Normal rate, regular rhythm, normal heart sounds and intact distal pulses.  Exam reveals no gallop.   No murmur heard. Pulmonary/Chest: Effort normal and breath sounds normal. No respiratory distress. He has no wheezes. He has no rales.  Abdominal: Soft. There is no tenderness.  Musculoskeletal: He exhibits no edema or tenderness.  Lymphadenopathy:    He has no cervical adenopathy.  Neurological: He is alert and oriented to person, place, and time.  Skin: No rash noted. No erythema.  Psychiatric: He has a normal mood and affect. His behavior is normal.          Assessment & Plan:

## 2015-08-21 NOTE — Addendum Note (Signed)
Addended by: Royann Shivers A on: 08/21/2015 03:31 PM   Modules accepted: Orders

## 2015-08-22 LAB — COMPREHENSIVE METABOLIC PANEL
A/G RATIO: 1.7 (ref 1.2–2.2)
ALK PHOS: 76 IU/L (ref 39–117)
ALT: 28 IU/L (ref 0–44)
AST: 36 IU/L (ref 0–40)
Albumin: 4.4 g/dL (ref 3.5–5.5)
BILIRUBIN TOTAL: 1.4 mg/dL — AB (ref 0.0–1.2)
BUN / CREAT RATIO: 14 (ref 9–20)
BUN: 11 mg/dL (ref 6–24)
CHLORIDE: 97 mmol/L (ref 96–106)
CO2: 26 mmol/L (ref 18–29)
Calcium: 9.7 mg/dL (ref 8.7–10.2)
Creatinine, Ser: 0.8 mg/dL (ref 0.76–1.27)
GFR calc Af Amer: 115 mL/min/{1.73_m2} (ref >59)
GFR calc non Af Amer: 99 mL/min/{1.73_m2} (ref >59)
GLUCOSE: 89 mg/dL (ref 65–99)
Globulin, Total: 2.6 g/dL (ref 1.5–4.5)
Potassium: 4 mmol/L (ref 3.5–5.2)
Sodium: 139 mmol/L (ref 134–144)
TOTAL PROTEIN: 7 g/dL (ref 6.0–8.5)

## 2015-08-22 LAB — CBC WITH DIFFERENTIAL/PLATELET
Basophils Absolute: 0 10*3/uL (ref 0.0–0.2)
Basos: 1 %
EOS (ABSOLUTE): 0.1 10*3/uL (ref 0.0–0.4)
Eos: 2 %
Hematocrit: 44.9 % (ref 37.5–51.0)
Hemoglobin: 15.3 g/dL (ref 12.6–17.7)
Immature Grans (Abs): 0 10*3/uL (ref 0.0–0.1)
Immature Granulocytes: 0 %
LYMPHS ABS: 1.5 10*3/uL (ref 0.7–3.1)
LYMPHS: 21 %
MCH: 32.1 pg (ref 26.6–33.0)
MCHC: 34.1 g/dL (ref 31.5–35.7)
MCV: 94 fL (ref 79–97)
Monocytes Absolute: 0.5 10*3/uL (ref 0.1–0.9)
Monocytes: 7 %
NEUTROS ABS: 5.1 10*3/uL (ref 1.4–7.0)
Neutrophils: 69 %
PLATELETS: 265 10*3/uL (ref 150–379)
RBC: 4.77 x10E6/uL (ref 4.14–5.80)
RDW: 13.8 % (ref 12.3–15.4)
WBC: 7.3 10*3/uL (ref 3.4–10.8)

## 2015-08-22 LAB — T4, FREE: FREE T4: 0.97 ng/dL (ref 0.82–1.77)

## 2015-08-22 LAB — TSH: TSH: 5.85 u[IU]/mL — AB (ref 0.450–4.500)

## 2015-08-22 LAB — HEPATITIS C ANTIBODY

## 2015-11-01 ENCOUNTER — Other Ambulatory Visit: Payer: Self-pay | Admitting: Internal Medicine

## 2015-12-08 ENCOUNTER — Other Ambulatory Visit: Payer: Self-pay | Admitting: Internal Medicine

## 2015-12-18 ENCOUNTER — Other Ambulatory Visit: Payer: Self-pay | Admitting: Internal Medicine

## 2016-01-20 ENCOUNTER — Other Ambulatory Visit: Payer: Self-pay | Admitting: Internal Medicine

## 2016-01-21 NOTE — Telephone Encounter (Signed)
Approved:  #[id number] x 0 

## 2016-01-21 NOTE — Telephone Encounter (Signed)
Last filled 07-24-15 #180 Last OV 08-16-15 Next OV 08-26-16

## 2016-02-23 DIAGNOSIS — M25512 Pain in left shoulder: Secondary | ICD-10-CM | POA: Diagnosis not present

## 2016-02-27 DIAGNOSIS — M1711 Unilateral primary osteoarthritis, right knee: Secondary | ICD-10-CM | POA: Diagnosis not present

## 2016-02-27 DIAGNOSIS — M222X1 Patellofemoral disorders, right knee: Secondary | ICD-10-CM | POA: Diagnosis not present

## 2016-02-27 DIAGNOSIS — M7542 Impingement syndrome of left shoulder: Secondary | ICD-10-CM | POA: Diagnosis not present

## 2016-03-19 DIAGNOSIS — M25561 Pain in right knee: Secondary | ICD-10-CM | POA: Diagnosis not present

## 2016-03-19 DIAGNOSIS — M7542 Impingement syndrome of left shoulder: Secondary | ICD-10-CM | POA: Diagnosis not present

## 2016-03-19 DIAGNOSIS — M25512 Pain in left shoulder: Secondary | ICD-10-CM | POA: Diagnosis not present

## 2016-03-19 DIAGNOSIS — M222X1 Patellofemoral disorders, right knee: Secondary | ICD-10-CM | POA: Diagnosis not present

## 2016-03-21 DIAGNOSIS — M25561 Pain in right knee: Secondary | ICD-10-CM | POA: Diagnosis not present

## 2016-03-21 DIAGNOSIS — M25512 Pain in left shoulder: Secondary | ICD-10-CM | POA: Diagnosis not present

## 2016-03-21 DIAGNOSIS — M222X1 Patellofemoral disorders, right knee: Secondary | ICD-10-CM | POA: Diagnosis not present

## 2016-03-24 DIAGNOSIS — M7542 Impingement syndrome of left shoulder: Secondary | ICD-10-CM | POA: Diagnosis not present

## 2016-03-24 DIAGNOSIS — M222X1 Patellofemoral disorders, right knee: Secondary | ICD-10-CM | POA: Diagnosis not present

## 2016-03-26 DIAGNOSIS — M7542 Impingement syndrome of left shoulder: Secondary | ICD-10-CM | POA: Diagnosis not present

## 2016-03-26 DIAGNOSIS — M222X1 Patellofemoral disorders, right knee: Secondary | ICD-10-CM | POA: Diagnosis not present

## 2016-04-02 DIAGNOSIS — M25512 Pain in left shoulder: Secondary | ICD-10-CM | POA: Diagnosis not present

## 2016-04-02 DIAGNOSIS — M222X1 Patellofemoral disorders, right knee: Secondary | ICD-10-CM | POA: Diagnosis not present

## 2016-04-02 DIAGNOSIS — M7542 Impingement syndrome of left shoulder: Secondary | ICD-10-CM | POA: Diagnosis not present

## 2016-04-04 DIAGNOSIS — M25561 Pain in right knee: Secondary | ICD-10-CM | POA: Diagnosis not present

## 2016-04-04 DIAGNOSIS — M25512 Pain in left shoulder: Secondary | ICD-10-CM | POA: Diagnosis not present

## 2016-04-07 DIAGNOSIS — M222X1 Patellofemoral disorders, right knee: Secondary | ICD-10-CM | POA: Diagnosis not present

## 2016-04-07 DIAGNOSIS — M7542 Impingement syndrome of left shoulder: Secondary | ICD-10-CM | POA: Diagnosis not present

## 2016-04-11 DIAGNOSIS — M7542 Impingement syndrome of left shoulder: Secondary | ICD-10-CM | POA: Diagnosis not present

## 2016-04-11 DIAGNOSIS — M222X1 Patellofemoral disorders, right knee: Secondary | ICD-10-CM | POA: Diagnosis not present

## 2016-05-09 DIAGNOSIS — M7542 Impingement syndrome of left shoulder: Secondary | ICD-10-CM | POA: Diagnosis not present

## 2016-05-09 DIAGNOSIS — M222X1 Patellofemoral disorders, right knee: Secondary | ICD-10-CM | POA: Diagnosis not present

## 2016-05-18 ENCOUNTER — Encounter: Payer: Self-pay | Admitting: *Deleted

## 2016-05-18 ENCOUNTER — Emergency Department: Payer: BLUE CROSS/BLUE SHIELD

## 2016-05-18 ENCOUNTER — Emergency Department
Admission: EM | Admit: 2016-05-18 | Discharge: 2016-05-18 | Disposition: A | Discharge status: Home / Self Care | Payer: BLUE CROSS/BLUE SHIELD | Attending: Emergency Medicine | Admitting: Emergency Medicine

## 2016-05-18 DIAGNOSIS — I10 Essential (primary) hypertension: Secondary | ICD-10-CM | POA: Diagnosis not present

## 2016-05-18 DIAGNOSIS — M25552 Pain in left hip: Secondary | ICD-10-CM | POA: Diagnosis not present

## 2016-05-18 DIAGNOSIS — M1612 Unilateral primary osteoarthritis, left hip: Secondary | ICD-10-CM | POA: Diagnosis not present

## 2016-05-18 DIAGNOSIS — E039 Hypothyroidism, unspecified: Secondary | ICD-10-CM | POA: Insufficient documentation

## 2016-05-18 DIAGNOSIS — M545 Low back pain: Secondary | ICD-10-CM | POA: Diagnosis not present

## 2016-05-18 DIAGNOSIS — Z79899 Other long term (current) drug therapy: Secondary | ICD-10-CM | POA: Insufficient documentation

## 2016-05-18 DIAGNOSIS — M1610 Unilateral primary osteoarthritis, unspecified hip: Secondary | ICD-10-CM

## 2016-05-18 DIAGNOSIS — M169 Osteoarthritis of hip, unspecified: Secondary | ICD-10-CM | POA: Diagnosis not present

## 2016-05-18 MED ORDER — OXYCODONE-ACETAMINOPHEN 5-325 MG PO TABS: 1.0000 | ORAL_TABLET | Freq: Four times a day (QID) | ORAL | 0 refills | Status: DC | PRN

## 2016-05-18 MED ORDER — OXYCODONE-ACETAMINOPHEN 5-325 MG PO TABS
1.0000 | ORAL_TABLET | Freq: Once | ORAL | Status: AC
  Administered 2016-05-18: 1 via ORAL
  Filled 2016-05-18: qty 1

## 2016-05-18 NOTE — ED Provider Notes (Signed)
[facility] Emergency Department Provider Note  ____________________________________________   I have reviewed the triage vital signs and the nursing notes.   HISTORY  Chief Complaint Hip Pain    HPI Jason [name] is a 61 y.o. 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 Patient has also had arthritic problems in his right shoulder and a shoulder rotator cuff repair remotely. He presents today with left-sided hip pain. He was told when they did his right hip that he would have to have his left hip replaced ultimately is because of advancing arthritis in that hip. Patient states that last month he has had a gradually worsening pain in that hip which is exactly like the arthritic pain that he had in his right hip prior to the replacement. He denies any fever or chills. Sometimes the pain seems to radiate down to his knee. He denies any trauma. Denies any incontinence of bowel or bilateral numbness or weakness or any neurologic signs. He states that he took naproxen at home but did not seem to get his pain under control. He does have an orthopedic surgeon he will follow up with, however, because it is a weekend, he is here for pain control. He is able to bear weight on the hip but it is more painful for him to use a walker which she has used in the past for arthritis    Past Medical History:  Diagnosis Date  . Allergy   . Arthritis    osteoarthritis  . GERD (gastroesophageal reflux disease)   . Hypertension   . Thyroid disease    hypo    Patient Active Problem List   Diagnosis Date Noted  . BPH (benign prostatic hypertrophy) 11/07/2013  . Routine general medical examination at a health care facility 06/24/2010  . Hypothyroidism 06/19/2009  . OSTEOARTHRITIS 12/19/2008  . BACK PAIN, LUMBAR 02/26/2007  . Essential hypertension, benign 09/17/2006  . ALLERGIC RHINITIS 09/17/2006  . GERD  09/17/2006    Past Surgical History:  Procedure Laterality Date  . CARDIAC CATHETERIZATION  03-2000  . ROTATOR CUFF REPAIR  10/12   Dr [name] Heck  . TONSILLECTOMY    . TOTAL HIP ARTHROPLASTY  02-24-2009    Prior to Admission medications   Medication Sig Start Date End Date Taking? Authorizing Provider  acetaminophen (TYLENOL) 325 MG tablet Take 650 mg by mouth 2 (two) times daily.      Historical Provider, MD  cetirizine (ZYRTEC) 10 MG tablet Take 10 mg by mouth daily.    Historical Provider, MD  cyclobenzaprine (FLEXERIL) 10 MG tablet TAKE 1/2 TO 1 TABLET BY MOUTH TWICE DAILY AS NEEDED FOR MUSCLE SPASM 01/21/16   Venia Carbon, MD  levothyroxine (SYNTHROID, LEVOTHROID) 75 MCG tablet TAKE 1 TABLET (75 MCG TOTAL) BY MOUTH DAILY. 12/18/15   Venia Carbon, MD  lisinopril-hydrochlorothiazide (PRINZIDE,ZESTORETIC) 20-25 MG tablet TAKE ONE TABLET BY MOUTH ONCE DAILY 11/01/15   Venia Carbon, MD  loratadine (CLARITIN) 10 MG tablet Take 10 mg by mouth daily. Can take 2 tabs if needed     Historical Provider, MD  Multiple Vitamin (MULTIVITAMIN) tablet Take 1 tablet by mouth daily.      Historical Provider, MD  ranitidine (ZANTAC) 75 MG tablet Take 75 mg by mouth 2 (two) times daily.     Historical Provider, MD  sildenafil (REVATIO) 20 MG tablet Take 3-5 tablets (60-100 mg total) by mouth daily  as needed. 08/21/15   Venia Carbon, MD  tamsulosin (FLOMAX) 0.4 MG CAPS capsule TAKE 1 CAPSULE (0.4 MG TOTAL) BY MOUTH DAILY. 12/10/15   Venia Carbon, MD    Allergies Patient has no known allergies.  Family History  Problem Relation Age of Onset  . Gout Father   . Arrhythmia Mother   . Hypertension Mother     Social History Social History  Substance Use Topics  . Smoking status: Never Smoker  . Smokeless tobacco: Never Used  . Alcohol use Yes    Review of Systems Constitutional: No fever/chills Eyes: No visual changes. ENT: No sore throat. No stiff neck no neck pain Cardiovascular:  Denies chest pain. Respiratory: Denies shortness of breath. Gastrointestinal:   no vomiting.  No diarrhea.  No constipation. Genitourinary: Negative for dysuria. Musculoskeletal: Negative lower extremity swelling Skin: Negative for rash. Neurological: Negative for severe headaches, focal weakness or numbness. 10-point ROS otherwise negative.  ____________________________________________   PHYSICAL EXAM:  VITAL SIGNS: ED Triage Vitals [05/18/16 0817]  Enc Vitals Group     BP (!) 182/105     Pulse Rate 87     Resp 20     Temp 97.5 F (36.4 C)     Temp Source Oral     SpO2 98 %     Weight 210 lb (95.3 kg)     Height 5\' 10"  (1.778 m)     Head Circumference      Peak Flow      Pain Score 6     Pain Loc      Pain Edu?      Excl. in [place]?     Constitutional: Alert and oriented. Well appearing and in no acute distress. Eyes: Conjunctivae are normal. PERRL. EOMI. Head: Atraumatic. Nose: No congestion/rhinnorhea. Mouth/Throat: Mucous membranes are moist.  Oropharynx non-erythematous. Neck: No stridor.   Nontender with no meningismus Cardiovascular: Normal rate, regular rhythm. Grossly normal heart sounds.  Good peripheral circulation. Respiratory: Normal respiratory effort.  No retractions. Lungs CTAB. Abdominal: Soft and nontender. No distention. No guarding no rebound Back:  There is no focal tenderness or step off.  there is no midline tenderness there are no lesions noted. there is no CVA tenderness Musculoskeletal: Full range of motion of the left hip is noted with some reduction of his discomfort.. He does have minimal tenderness to palpation to the hip area. No saddle anesthesia,'s no knee pain or tenderness no ankle tenderness, murmurs or soft, pulses are strong, patient has No lower extremity tenderness, no upper extremity tenderness. No joint effusions, no DVT signs strong distal pulses no edema, the joint is not red or hot to touch. Neurologic:  Normal speech and language.  No gross focal neurologic deficits are appreciated.  Skin:  Skin is warm, dry and intact. No rash noted. Psychiatric: Mood and affect are normal. Speech and behavior are normal.  ____________________________________________   LABS (all labs ordered are listed, but only abnormal results are displayed)  Labs Reviewed - No data to display ____________________________________________  EKG  I personally interpreted any EKGs ordered by me or triage  ____________________________________________  RADIOLOGY  I reviewed any imaging ordered by me or triage that were performed during my shift and, if possible, patient and/or family made aware of any abnormal findings. ____________________________________________   PROCEDURES  Procedure(s) performed: None  Procedures  Critical Care performed: None  ____________________________________________   INITIAL IMPRESSION / ASSESSMENT AND PLAN / ED COURSE  Pertinent labs & imaging  results that were available during my care of the patient were reviewed by me and considered in my medical decision making (see chart for details).  Patient here with ongoing hip pain for nearly a month. X-ray shows no evidence of fracture. Clinically there is nothing to suggest infection either of the bursa or of the joint. Patient has no evidence of cauda equina syndrome, this could also represent some degree of sciatic pain as there is some referral down to his knee but there is no evidence at this time that there is any significant neurologic component to this and he has no complains of numbness or weakness. Patient's x-ray was noted to read arthritis which I believe to be the cause of his problem. We will send her home with pain medication to cover until he can see his orthopedic surgery. Radiology also noted the possibility of there is some slight lytic lesions in the right ASIS. Ears [place]elin no evidence of bone lesions that they detect any rales and the patient has  no symptoms in this area. A nonemergent CT scan has been advised and as the patient will be following closely with orthopedic surgeons he would prefer to see them prior to obtaining a CT scan here which I don't think is unreasonable. Patient's blood pressure was somewhat elevated however he has no chest pain shortness of breath or symptoms with it and he was in pain when he arrived, we will recheck. I did make the patient aware of the possibility of Paget's disease and other diseases of the bone that could present with lytic lesions and the need for close outpatient follow-up. Patient aware of the possibility of indolent infection in his hip although he think it is very low and the necessity to return for any new or worrisome symptoms including fever numbness or weakness. Extensive return progresses all given and understood patient will follow closely with primary care doctor as well as orthopedics.    ____________________________________________   FINAL CLINICAL IMPRESSION(S) / ED DIAGNOSES  Final diagnoses:  None      This chart was dictated using voice recognition software.  Despite best efforts to proofread,  errors can occur which can change meaning.      Schuyler Amor, MD 05/18/16 1002

## 2016-05-18 NOTE — ED Triage Notes (Signed)
Pt complains of left hip pain for 3 weeks, denies nay other symptoms

## 2016-05-18 NOTE — ED Notes (Signed)
Patient transported to X-ray at this time 

## 2016-05-18 NOTE — Discharge Instructions (Signed)
Return to the emergency room for any new or worrisome symptoms including fever, this, numbness, incontinence of bowel or bladder. We did note on your right hip the possibility of some bone lesions which will require outpatient nonemergent CT scan or MRI. Please talk to your orthopedic surgeon about this in the next few days. In addition, we advise you to continue taking naproxen. Your  blood pressure was somewhat elevated here but that can be because of pain or multiple other different concerns. Please continue taking her blood pressure medications as prescribed. If there is chest pain or shortness of breath return to the emergency room. Follow up closely with primary care doctor for repeat bp in the next few days.

## 2016-05-19 ENCOUNTER — Telehealth: Payer: Self-pay

## 2016-05-19 DIAGNOSIS — M1612 Unilateral primary osteoarthritis, left hip: Secondary | ICD-10-CM | POA: Diagnosis not present

## 2016-05-19 DIAGNOSIS — R102 Pelvic and perineal pain: Secondary | ICD-10-CM | POA: Diagnosis not present

## 2016-05-19 NOTE — Telephone Encounter (Signed)
Left message to call office to f/u on his ER visit. Need to make sure he is set up to see Ortho

## 2016-05-20 NOTE — Telephone Encounter (Signed)
Patient saw Dr.Maurice Ronnald Ramp at [facility] yesterday.  Patient scheduled follow up appointment with Dr.Letvak.

## 2016-05-21 ENCOUNTER — Ambulatory Visit (INDEPENDENT_AMBULATORY_CARE_PROVIDER_SITE_OTHER): Payer: BLUE CROSS/BLUE SHIELD | Admitting: Internal Medicine

## 2016-05-21 ENCOUNTER — Encounter: Payer: Self-pay | Admitting: Internal Medicine

## 2016-05-21 VITALS — BP 138/90 | HR 80 | Temp 98.1°F | Wt 215.0 lb

## 2016-05-21 DIAGNOSIS — M1612 Unilateral primary osteoarthritis, left hip: Secondary | ICD-10-CM | POA: Insufficient documentation

## 2016-05-21 NOTE — Progress Notes (Signed)
Pre visit review using our clinic review tool, if applicable. No additional management support is needed unless otherwise documented below in the visit note. 

## 2016-05-21 NOTE — Assessment & Plan Note (Signed)
Having evaluation by ortho--may need THR on left now ?labral tear (so may not be at need for surgery)

## 2016-05-21 NOTE — Progress Notes (Signed)
   Subjective:    Patient ID: Jason [name], male    DOB: [birth year], 61 y.o.   MRN: [id number]  HPI Here for ER follow up Severe hip pain Going to [facility] and has had follow up MRI is scheduled  Current Outpatient Prescriptions on File Prior to Visit  Medication Sig Dispense Refill  . acetaminophen (TYLENOL) 325 MG tablet Take 650 mg by mouth 2 (two) times daily.      . cetirizine (ZYRTEC) 10 MG tablet Take 10 mg by mouth daily.    . cyclobenzaprine (FLEXERIL) 10 MG tablet TAKE 1/2 TO 1 TABLET BY MOUTH TWICE DAILY AS NEEDED FOR MUSCLE SPASM 180 tablet 0  . levothyroxine (SYNTHROID, LEVOTHROID) 75 MCG tablet TAKE 1 TABLET (75 MCG TOTAL) BY MOUTH DAILY. 90 tablet 2  . lisinopril-hydrochlorothiazide (PRINZIDE,ZESTORETIC) 20-25 MG tablet TAKE ONE TABLET BY MOUTH ONCE DAILY 90 tablet 3  . loratadine (CLARITIN) 10 MG tablet Take 10 mg by mouth daily. Can take 2 tabs if needed     . Multiple Vitamin (MULTIVITAMIN) tablet Take 1 tablet by mouth daily.      Marland Kitchen oxyCODONE-acetaminophen (ROXICET) 5-325 MG tablet Take 1 tablet by mouth every 6 (six) hours as needed for severe pain. 6 tablet 0  . ranitidine (ZANTAC) 75 MG tablet Take 75 mg by mouth 2 (two) times daily.     . sildenafil (REVATIO) 20 MG tablet Take 3-5 tablets (60-100 mg total) by mouth daily as needed. 50 tablet 11  . tamsulosin (FLOMAX) 0.4 MG CAPS capsule TAKE 1 CAPSULE (0.4 MG TOTAL) BY MOUTH DAILY. 90 capsule 2   No current facility-administered medications on file prior to visit.     No Known Allergies  Past Medical History:  Diagnosis Date  . Allergy   . Arthritis    osteoarthritis  . GERD (gastroesophageal reflux disease)   . Hypertension   . Thyroid disease    hypo    Past Surgical History:  Procedure Laterality Date  . CARDIAC CATHETERIZATION  03-2000  . ROTATOR CUFF REPAIR  10/12   Dr [name] Heck  . TONSILLECTOMY    . TOTAL HIP ARTHROPLASTY  02-24-2009    Family History  Problem Relation Age of Onset  .  Gout Father   . Arrhythmia Mother   . Hypertension Mother     Social History   Social History  . Marital status: Married    Spouse name: N/A  . Number of children: 0  . 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 Smoking status: Never Smoker  . Smokeless tobacco: Never Used  . Alcohol use Yes  . Drug use: No  . Sexual activity: Not on file   Other Topics Concern  . Not on file   Social History Narrative  . No narrative on file   Review of Systems     Objective:   Physical Exam        Assessment & Plan:

## 2016-05-27 ENCOUNTER — Other Ambulatory Visit: Payer: Self-pay | Admitting: Orthopedic Surgery

## 2016-05-27 DIAGNOSIS — M1612 Unilateral primary osteoarthritis, left hip: Secondary | ICD-10-CM

## 2016-05-27 DIAGNOSIS — R102 Pelvic and perineal pain: Secondary | ICD-10-CM

## 2016-06-06 ENCOUNTER — Ambulatory Visit
Admission: RE | Admit: 2016-06-06 | Discharge: 2016-06-06 | Disposition: A | Discharge status: Home / Self Care | Payer: BLUE CROSS/BLUE SHIELD | Source: Ambulatory Visit | Attending: Orthopedic Surgery | Admitting: Orthopedic Surgery

## 2016-06-06 DIAGNOSIS — M899 Disorder of bone, unspecified: Secondary | ICD-10-CM | POA: Insufficient documentation

## 2016-06-06 DIAGNOSIS — M1612 Unilateral primary osteoarthritis, left hip: Secondary | ICD-10-CM

## 2016-06-06 DIAGNOSIS — M48061 Spinal stenosis, lumbar region without neurogenic claudication: Secondary | ICD-10-CM | POA: Insufficient documentation

## 2016-06-06 DIAGNOSIS — M8508 Fibrous dysplasia (monostotic), other site: Secondary | ICD-10-CM | POA: Diagnosis not present

## 2016-06-06 DIAGNOSIS — R102 Pelvic and perineal pain: Secondary | ICD-10-CM | POA: Insufficient documentation

## 2016-06-06 DIAGNOSIS — M25452 Effusion, left hip: Secondary | ICD-10-CM | POA: Insufficient documentation

## 2016-06-06 LAB — POCT I-STAT CREATININE: Creatinine, Ser: 0.9 mg/dL (ref 0.61–1.24)

## 2016-06-06 MED ORDER — GADOBENATE DIMEGLUMINE 529 MG/ML IV SOLN
20.0000 mL | Freq: Once | INTRAVENOUS | Status: AC | PRN
  Administered 2016-06-06: 20 mL via INTRAVENOUS

## 2016-06-10 DIAGNOSIS — M1612 Unilateral primary osteoarthritis, left hip: Secondary | ICD-10-CM | POA: Diagnosis not present

## 2016-06-11 ENCOUNTER — Encounter: Payer: Self-pay | Admitting: Internal Medicine

## 2016-06-24 HISTORY — PX: TOTAL HIP ARTHROPLASTY: SHX124

## 2016-06-30 DIAGNOSIS — M1612 Unilateral primary osteoarthritis, left hip: Secondary | ICD-10-CM | POA: Diagnosis not present

## 2016-07-04 ENCOUNTER — Ambulatory Visit (INDEPENDENT_AMBULATORY_CARE_PROVIDER_SITE_OTHER): Payer: BLUE CROSS/BLUE SHIELD | Admitting: Internal Medicine

## 2016-07-04 ENCOUNTER — Encounter: Payer: Self-pay | Admitting: Internal Medicine

## 2016-07-04 VITALS — BP 136/82 | HR 76 | Temp 98.2°F | Wt 217.0 lb

## 2016-07-04 DIAGNOSIS — Z01818 Encounter for other preprocedural examination: Secondary | ICD-10-CM | POA: Diagnosis not present

## 2016-07-04 NOTE — Addendum Note (Signed)
Addended by: Ellamae Sia on: 07/04/2016 08:43 AM   Modules accepted: Orders

## 2016-07-04 NOTE — Progress Notes (Signed)
Subjective:    Patient ID: Jason [name], male    DOB: [birth year], 61 y.o.   MRN: [id number]  HPI Here for preoperative evaluation For left THR--- Dr Harlow Mares  Did go directly home after right THR in past  Not feeling sick No cough or fever No chest pain No SOB No dizziness or syncope No edema  Current Outpatient Prescriptions on File Prior to Visit  Medication Sig Dispense Refill  . acetaminophen (TYLENOL) 325 MG tablet Take 650 mg by mouth 2 (two) times daily.      . cetirizine (ZYRTEC) 10 MG tablet Take 10 mg by mouth daily.    . cyclobenzaprine (FLEXERIL) 10 MG tablet TAKE 1/2 TO 1 TABLET BY MOUTH TWICE DAILY AS NEEDED FOR MUSCLE SPASM 180 tablet 0  . levothyroxine (SYNTHROID, LEVOTHROID) 75 MCG tablet TAKE 1 TABLET (75 MCG TOTAL) BY MOUTH DAILY. 90 tablet 2  . lisinopril-hydrochlorothiazide (PRINZIDE,ZESTORETIC) 20-25 MG tablet TAKE ONE TABLET BY MOUTH ONCE DAILY 90 tablet 3  . loratadine (CLARITIN) 10 MG tablet Take 10 mg by mouth daily. Can take 2 tabs if needed     . Multiple Vitamin (MULTIVITAMIN) tablet Take 1 tablet by mouth daily.      . ranitidine (ZANTAC) 75 MG tablet Take 75 mg by mouth 2 (two) times daily.     . sildenafil (REVATIO) 20 MG tablet Take 3-5 tablets (60-100 mg total) by mouth daily as needed. 50 tablet 11  . tamsulosin (FLOMAX) 0.4 MG CAPS capsule TAKE 1 CAPSULE (0.4 MG TOTAL) BY MOUTH DAILY. 90 capsule 2  . traMADol (ULTRAM) 50 MG tablet Take 1 tablet by mouth 2 (two) times daily.     No current facility-administered medications on file prior to visit.     No Known Allergies  Past Medical History:  Diagnosis Date  . Allergy   . Arthritis    osteoarthritis  . GERD (gastroesophageal reflux disease)   . Hypertension   . Thyroid disease    hypo    Past Surgical History:  Procedure Laterality Date  . CARDIAC CATHETERIZATION  03-2000  . ROTATOR CUFF REPAIR  10/12   Dr [name] Heck  . TONSILLECTOMY    . TOTAL HIP ARTHROPLASTY  02-24-2009     Family History  Problem Relation Age of Onset  . Gout Father   . Arrhythmia Mother   . Hypertension Mother     Social History   Social History  . Marital status: Married    Spouse name: N/A  . Number of children: 0  . 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 Smoking status: Never Smoker  . Smokeless tobacco: Never Used  . Alcohol use Yes  . Drug use: No  . Sexual activity: Not on file   Other Topics Concern  . Not on file   Social History Narrative  . No narrative on file   Review of Systems  Appetite is good Sleeping is better now--was bad last week due to hip pain (tramadol helping) Bowels are fine     Objective:   Physical Exam  Constitutional: He appears well-nourished. No distress.  Neck: No thyromegaly present.  Cardiovascular: Normal rate, regular rhythm, normal heart sounds and intact distal pulses.  Exam reveals no gallop.   No murmur heard. Pulmonary/Chest: Effort normal and breath sounds normal. No respiratory distress. He has no wheezes. He has no rales.  Musculoskeletal: He exhibits no edema or tenderness.  Lymphadenopathy:    He has no cervical adenopathy.  Psychiatric: He has a normal mood and affect. His behavior is normal.          Assessment & Plan:

## 2016-07-04 NOTE — Assessment & Plan Note (Addendum)
Healthy and low risk No worrisome symptoms and normal cardiorespiratory exam EKG shows sinus rhythm--possible LVH, but this is not new from 9/12 tracing. No ischemic changes  He seems to be ready for the surgery No special perioperative recommendations Will check labs as requested

## 2016-07-05 LAB — URINALYSIS
Bilirubin, UA: NEGATIVE
Glucose, UA: NEGATIVE
KETONES UA: NEGATIVE
Leukocytes, UA: NEGATIVE
NITRITE UA: NEGATIVE
PROTEIN UA: NEGATIVE
RBC UA: NEGATIVE
SPEC GRAV UA: 1.005 (ref 1.005–1.030)
Urobilinogen, Ur: 0.2 mg/dL (ref 0.2–1.0)
pH, UA: 7.5 (ref 5.0–7.5)

## 2016-07-05 LAB — CBC WITH DIFFERENTIAL/PLATELET
Basophils Absolute: 0.1 10*3/uL (ref 0.0–0.2)
Basos: 1 %
EOS (ABSOLUTE): 0.3 10*3/uL (ref 0.0–0.4)
EOS: 4 %
Hematocrit: 43.4 % (ref 37.5–51.0)
Hemoglobin: 14.9 g/dL (ref 13.0–17.7)
Immature Grans (Abs): 0 10*3/uL (ref 0.0–0.1)
Immature Granulocytes: 0 %
Lymphocytes Absolute: 1.7 10*3/uL (ref 0.7–3.1)
Lymphs: 24 %
MCH: 31.8 pg (ref 26.6–33.0)
MCHC: 34.3 g/dL (ref 31.5–35.7)
MCV: 93 fL (ref 79–97)
MONOS ABS: 0.5 10*3/uL (ref 0.1–0.9)
Monocytes: 8 %
NEUTROS ABS: 4.5 10*3/uL (ref 1.4–7.0)
Neutrophils: 63 %
Platelets: 280 10*3/uL (ref 150–379)
RBC: 4.69 x10E6/uL (ref 4.14–5.80)
RDW: 13.6 % (ref 12.3–15.4)
WBC: 7 10*3/uL (ref 3.4–10.8)

## 2016-07-05 LAB — COMPREHENSIVE METABOLIC PANEL
ALBUMIN: 4.5 g/dL (ref 3.5–5.5)
ALK PHOS: 98 IU/L (ref 39–117)
ALT: 24 IU/L (ref 0–44)
AST: 25 IU/L (ref 0–40)
Albumin/Globulin Ratio: 1.7 (ref 1.2–2.2)
BUN/Creatinine Ratio: 16 (ref 9–20)
BUN: 14 mg/dL (ref 6–24)
Bilirubin Total: 0.8 mg/dL (ref 0.0–1.2)
CHLORIDE: 97 mmol/L (ref 96–106)
CO2: 30 mmol/L — ABNORMAL HIGH (ref 18–29)
Calcium: 9.7 mg/dL (ref 8.7–10.2)
Creatinine, Ser: 0.86 mg/dL (ref 0.76–1.27)
GFR calc Af Amer: 110 mL/min/{1.73_m2} (ref >59)
GFR calc non Af Amer: 96 mL/min/{1.73_m2} (ref >59)
Globulin, Total: 2.6 g/dL (ref 1.5–4.5)
Glucose: 74 mg/dL (ref 65–99)
Potassium: 3.8 mmol/L (ref 3.5–5.2)
Sodium: 140 mmol/L (ref 134–144)
Total Protein: 7.1 g/dL (ref 6.0–8.5)

## 2016-07-08 DIAGNOSIS — Z01818 Encounter for other preprocedural examination: Secondary | ICD-10-CM | POA: Diagnosis not present

## 2016-07-08 DIAGNOSIS — Z01812 Encounter for preprocedural laboratory examination: Secondary | ICD-10-CM | POA: Diagnosis not present

## 2016-07-08 DIAGNOSIS — M1612 Unilateral primary osteoarthritis, left hip: Secondary | ICD-10-CM | POA: Diagnosis not present

## 2016-07-09 DIAGNOSIS — M25652 Stiffness of left hip, not elsewhere classified: Secondary | ICD-10-CM | POA: Diagnosis not present

## 2016-07-09 DIAGNOSIS — M25552 Pain in left hip: Secondary | ICD-10-CM | POA: Diagnosis not present

## 2016-07-11 DIAGNOSIS — I10 Essential (primary) hypertension: Secondary | ICD-10-CM | POA: Diagnosis not present

## 2016-07-11 DIAGNOSIS — M1612 Unilateral primary osteoarthritis, left hip: Secondary | ICD-10-CM | POA: Diagnosis not present

## 2016-07-11 DIAGNOSIS — E039 Hypothyroidism, unspecified: Secondary | ICD-10-CM | POA: Diagnosis not present

## 2016-07-11 DIAGNOSIS — M25552 Pain in left hip: Secondary | ICD-10-CM | POA: Diagnosis not present

## 2016-07-11 DIAGNOSIS — Z96649 Presence of unspecified artificial hip joint: Secondary | ICD-10-CM | POA: Diagnosis not present

## 2016-07-11 DIAGNOSIS — K219 Gastro-esophageal reflux disease without esophagitis: Secondary | ICD-10-CM | POA: Diagnosis not present

## 2016-07-11 DIAGNOSIS — N400 Enlarged prostate without lower urinary tract symptoms: Secondary | ICD-10-CM | POA: Diagnosis not present

## 2016-07-18 ENCOUNTER — Other Ambulatory Visit: Payer: Self-pay | Admitting: Internal Medicine

## 2016-07-22 NOTE — Telephone Encounter (Signed)
Approved:  #[id number] x 0 

## 2016-07-22 NOTE — Telephone Encounter (Signed)
Last filled 01-21-16 #180 Last OV 07-04-16 Next OV 08-26-16

## 2016-07-30 DIAGNOSIS — M25552 Pain in left hip: Secondary | ICD-10-CM | POA: Diagnosis not present

## 2016-07-30 DIAGNOSIS — R2689 Other abnormalities of gait and mobility: Secondary | ICD-10-CM | POA: Diagnosis not present

## 2016-08-01 DIAGNOSIS — M25552 Pain in left hip: Secondary | ICD-10-CM | POA: Diagnosis not present

## 2016-08-01 DIAGNOSIS — R2689 Other abnormalities of gait and mobility: Secondary | ICD-10-CM | POA: Diagnosis not present

## 2016-08-04 ENCOUNTER — Encounter: Payer: Self-pay | Admitting: Internal Medicine

## 2016-08-06 DIAGNOSIS — M25552 Pain in left hip: Secondary | ICD-10-CM | POA: Diagnosis not present

## 2016-08-06 DIAGNOSIS — M25659 Stiffness of unspecified hip, not elsewhere classified: Secondary | ICD-10-CM | POA: Diagnosis not present

## 2016-08-08 DIAGNOSIS — M6281 Muscle weakness (generalized): Secondary | ICD-10-CM | POA: Diagnosis not present

## 2016-08-11 ENCOUNTER — Encounter: Payer: Self-pay | Admitting: Internal Medicine

## 2016-08-12 DIAGNOSIS — R2689 Other abnormalities of gait and mobility: Secondary | ICD-10-CM | POA: Diagnosis not present

## 2016-08-12 DIAGNOSIS — M25552 Pain in left hip: Secondary | ICD-10-CM | POA: Diagnosis not present

## 2016-08-14 DIAGNOSIS — R2689 Other abnormalities of gait and mobility: Secondary | ICD-10-CM | POA: Diagnosis not present

## 2016-08-14 DIAGNOSIS — M25552 Pain in left hip: Secondary | ICD-10-CM | POA: Diagnosis not present

## 2016-08-19 DIAGNOSIS — R2689 Other abnormalities of gait and mobility: Secondary | ICD-10-CM | POA: Diagnosis not present

## 2016-08-19 DIAGNOSIS — M25552 Pain in left hip: Secondary | ICD-10-CM | POA: Diagnosis not present

## 2016-08-21 DIAGNOSIS — R2689 Other abnormalities of gait and mobility: Secondary | ICD-10-CM | POA: Diagnosis not present

## 2016-08-21 DIAGNOSIS — M25552 Pain in left hip: Secondary | ICD-10-CM | POA: Diagnosis not present

## 2016-08-25 DIAGNOSIS — M25552 Pain in left hip: Secondary | ICD-10-CM | POA: Diagnosis not present

## 2016-08-25 DIAGNOSIS — R2689 Other abnormalities of gait and mobility: Secondary | ICD-10-CM | POA: Diagnosis not present

## 2016-08-26 ENCOUNTER — Encounter: Payer: Self-pay | Admitting: Internal Medicine

## 2016-08-26 ENCOUNTER — Ambulatory Visit (INDEPENDENT_AMBULATORY_CARE_PROVIDER_SITE_OTHER): Payer: BLUE CROSS/BLUE SHIELD | Admitting: Internal Medicine

## 2016-08-26 DIAGNOSIS — N400 Enlarged prostate without lower urinary tract symptoms: Secondary | ICD-10-CM | POA: Diagnosis not present

## 2016-08-26 DIAGNOSIS — Z0000 Encounter for general adult medical examination without abnormal findings: Secondary | ICD-10-CM

## 2016-08-26 DIAGNOSIS — E039 Hypothyroidism, unspecified: Secondary | ICD-10-CM | POA: Diagnosis not present

## 2016-08-26 DIAGNOSIS — I10 Essential (primary) hypertension: Secondary | ICD-10-CM | POA: Diagnosis not present

## 2016-08-26 NOTE — Assessment & Plan Note (Signed)
BP Readings from Last 3 Encounters:  08/26/16 128/90  07/04/16 136/82  05/21/16 138/90   Good control No change needed Recent labs fine

## 2016-08-26 NOTE — Assessment & Plan Note (Signed)
Euthyroid  Will defer labs--doesn't need them yet

## 2016-08-26 NOTE — Progress Notes (Signed)
Subjective:    Patient ID: Jason [name], male    DOB: [birth year], 61 y.o.   MRN: [id number]  HPI Here for physical  THR went well Still at PT Just stopped using pain Hopes to go back to work soon---has surgery follow up soon  Had health assessment via insurance Now being pressed to lower BMI to under 30 Asks about appeal--told him I can do it Discussed that I would put goal of 5# weight loss as he recovers from surgery--over the next 6 months Ready to join gym--- just does bike at home  Current Outpatient Prescriptions on File Prior to Visit  Medication Sig Dispense Refill  . acetaminophen (TYLENOL) 325 MG tablet Take 650 mg by mouth 2 (two) times daily.      . cetirizine (ZYRTEC) 10 MG tablet Take 10 mg by mouth daily.    . cyclobenzaprine (FLEXERIL) 10 MG tablet TAKE 1/2 TO 1 TABLET BY MOUTH TWICE DAILY AS NEEDED FOR MUSCLE SPASM 180 tablet 0  . levothyroxine (SYNTHROID, LEVOTHROID) 75 MCG tablet TAKE 1 TABLET (75 MCG TOTAL) BY MOUTH DAILY. 90 tablet 2  . lisinopril-hydrochlorothiazide (PRINZIDE,ZESTORETIC) 20-25 MG tablet TAKE ONE TABLET BY MOUTH ONCE DAILY 90 tablet 3  . loratadine (CLARITIN) 10 MG tablet Take 10 mg by mouth daily. Can take 2 tabs if needed     . Multiple Vitamin (MULTIVITAMIN) tablet Take 1 tablet by mouth daily.      . ranitidine (ZANTAC) 75 MG tablet Take 75 mg by mouth 2 (two) times daily.     . sildenafil (REVATIO) 20 MG tablet Take 3-5 tablets (60-100 mg total) by mouth daily as needed. 50 tablet 11  . tamsulosin (FLOMAX) 0.4 MG CAPS capsule TAKE 1 CAPSULE (0.4 MG TOTAL) BY MOUTH DAILY. 90 capsule 2  . traMADol (ULTRAM) 50 MG tablet Take 1 tablet by mouth 2 (two) times daily.     No current facility-administered medications on file prior to visit.     No Known Allergies  Past Medical History:  Diagnosis Date  . Allergy   . Arthritis    osteoarthritis  . GERD (gastroesophageal reflux disease)   . Hypertension   . Thyroid disease    hypo     Past Surgical History:  Procedure Laterality Date  . CARDIAC CATHETERIZATION  03-2000  . ROTATOR CUFF REPAIR  10/12   Dr [name] Heck  . TONSILLECTOMY    . 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 TOTAL HIP ARTHROPLASTY Left 06/2016   Dr Harlow Mares    Family History  Problem Relation Age of Onset  . Gout Father   . Arrhythmia Mother   . Hypertension Mother     Social History   Social History  . Marital status: Married    Spouse name: N/A  . Number of children: 0  . 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 Smoking status: Never Smoker  . Smokeless tobacco: Never Used  . Alcohol use Yes  . Drug use: No  . Sexual activity: Not on file   Other Topics Concern  . Not on file   Social History Narrative  . No narrative on file   Review of Systems  Constitutional: Negative for fatigue and unexpected weight change.       Wears seat belt  HENT: Negative for hearing loss and tinnitus.        Has tooth he needs pulled--upper left molar  Eyes: Negative for visual disturbance.       No diplopia or unilateral vision loss  Respiratory: Negative for cough, chest tightness and shortness of breath.   Cardiovascular: Negative for chest pain and palpitations.       Post op leg swelling has resolved  Gastrointestinal: Positive for constipation. Negative for blood in stool and nausea.       No heartburn Taking laxative due to pain meds  Endocrine: Negative for polydipsia and polyuria.  Genitourinary: Negative for difficulty urinating and urgency.       Satisfied with sildenafil  Musculoskeletal: Positive for arthralgias and back pain. Negative for joint swelling.       Some right knee issues also  Skin: Negative for rash.       No suspicious lesions  Allergic/Immunologic: Positive for environmental allergies. Negative for immunocompromised state.  Neurological: Negative for dizziness, syncope and light-headedness.        Occ sinus headaches  Hematological: Negative for adenopathy. Does not bruise/bleed easily.  Psychiatric/Behavioral: Negative for dysphoric mood and sleep disturbance. The patient is not nervous/anxious.        Objective:   Physical Exam  Constitutional: He is oriented to person, place, and time. He appears well-nourished. No distress.  HENT:  Mouth/Throat: Oropharynx is clear and moist. No oropharyngeal exudate.  Eyes: Conjunctivae are normal. Pupils are equal, round, and reactive to light.  Neck: No thyromegaly present.  Cardiovascular: Normal rate, regular rhythm, normal heart sounds and intact distal pulses.  Exam reveals no gallop.   No murmur heard. Pulmonary/Chest: Effort normal and breath sounds normal. No respiratory distress. He has no wheezes. He has no rales.  Abdominal: Soft. There is no tenderness.  Musculoskeletal: He exhibits no edema or tenderness.  Lymphadenopathy:    He has no cervical adenopathy.  Neurological: He is alert and oriented to person, place, and time.  Skin: No rash noted. No erythema.  Psychiatric: He has a normal mood and affect. His behavior is normal.          Assessment & Plan:

## 2016-08-26 NOTE — Assessment & Plan Note (Signed)
Voids well on Rx

## 2016-08-26 NOTE — Patient Instructions (Signed)
DASH Eating Plan DASH stands for "Dietary Approaches to Stop Hypertension." The DASH eating plan is a healthy eating plan that has been shown to reduce high blood pressure (hypertension). It may also reduce your risk for type 2 diabetes, heart disease, and stroke. The DASH eating plan may also help with weight loss. What are tips for following this plan? General guidelines  Avoid eating more than 2,300 mg (milligrams) of salt (sodium) a day. If you have hypertension, you may need to reduce your sodium intake to 1,500 mg a day.  Limit alcohol intake to no more than 1 drink a day for nonpregnant women and 2 drinks a day for men. One drink equals 12 oz of beer, 5 oz of wine, or 1 oz of hard liquor.  Work with your health care provider to maintain a healthy body weight or to lose weight. Ask what an ideal weight is for you.  Get at least 30 minutes of exercise that causes your heart to beat faster (aerobic exercise) most days of the week. Activities may include walking, swimming, or biking.  Work with your health care provider or diet and nutrition specialist (dietitian) to adjust your eating plan to your individual calorie needs. Reading food labels  Check food labels for the amount of sodium per serving. Choose foods with less than 5 percent of the Daily Value of sodium. Generally, foods with less than 300 mg of sodium per serving fit into this eating plan.  To find whole grains, look for the word "whole" as the first word in the ingredient list. Shopping  Buy products labeled as "low-sodium" or "no salt added."  Buy fresh foods. Avoid canned foods and premade or frozen meals. Cooking  Avoid adding salt when cooking. Use salt-free seasonings or herbs instead of table salt or sea salt. Check with your health care provider or pharmacist before using salt substitutes.  Do not fry foods. Cook foods using healthy methods such as baking, boiling, grilling, and broiling instead.  Cook with  heart-healthy oils, such as olive, canola, soybean, or sunflower oil. Meal planning   Eat a balanced diet that includes: ? 5 or more servings of fruits and vegetables each day. At each meal, try to fill half of your plate with fruits and vegetables. ? Up to 6-8 servings of whole grains each day. ? Less than 6 oz of lean meat, poultry, or fish each day. A 3-oz serving of meat is about the same size as a deck of cards. One egg equals 1 oz. ? 2 servings of low-fat dairy each day. ? A serving of nuts, seeds, or beans 5 times each week. ? Heart-healthy fats. Healthy fats called Omega-3 fatty acids are found in foods such as flaxseeds and coldwater fish, like sardines, salmon, and mackerel.  Limit how much you eat of the following: ? Canned or prepackaged foods. ? Food that is high in trans fat, such as fried foods. ? Food that is high in saturated fat, such as fatty meat. ? Sweets, desserts, sugary drinks, and other foods with added sugar. ? Full-fat dairy products.  Do not salt foods before eating.  Try to eat at least 2 vegetarian meals each week.  Eat more home-cooked food and less restaurant, buffet, and fast food.  When eating at a restaurant, ask that your food be prepared with less salt or no salt, if possible. What foods are recommended? The items listed may not be a complete list. Talk with your dietitian about what   dietary choices are best for you. Grains Whole-grain or whole-wheat bread. Whole-grain or whole-wheat pasta. Brown rice. Oatmeal. Quinoa. Bulgur. Whole-grain and low-sodium cereals. Pita bread. Low-fat, low-sodium crackers. Whole-wheat flour tortillas. Vegetables Fresh or frozen vegetables (raw, steamed, roasted, or grilled). Low-sodium or reduced-sodium tomato and vegetable juice. Low-sodium or reduced-sodium tomato sauce and tomato paste. Low-sodium or reduced-sodium canned vegetables. Fruits All fresh, dried, or frozen fruit. Canned fruit in natural juice (without  added sugar). Meat and other protein foods Skinless chicken or turkey. Ground chicken or turkey. Pork with fat trimmed off. Fish and seafood. Egg whites. Dried beans, peas, or lentils. Unsalted nuts, nut butters, and seeds. Unsalted canned beans. Lean cuts of beef with fat trimmed off. Low-sodium, lean deli meat. Dairy Low-fat (1%) or fat-free (skim) milk. Fat-free, low-fat, or reduced-fat cheeses. Nonfat, low-sodium ricotta or cottage cheese. Low-fat or nonfat yogurt. Low-fat, low-sodium cheese. Fats and oils Soft margarine without trans fats. Vegetable oil. Low-fat, reduced-fat, or light mayonnaise and salad dressings (reduced-sodium). Canola, safflower, olive, soybean, and sunflower oils. Avocado. Seasoning and other foods Herbs. Spices. Seasoning mixes without salt. Unsalted popcorn and pretzels. Fat-free sweets. What foods are not recommended? The items listed may not be a complete list. Talk with your dietitian about what dietary choices are best for you. Grains Baked goods made with fat, such as croissants, muffins, or some breads. Dry pasta or rice meal packs. Vegetables Creamed or fried vegetables. Vegetables in a cheese sauce. Regular canned vegetables (not low-sodium or reduced-sodium). Regular canned tomato sauce and paste (not low-sodium or reduced-sodium). Regular tomato and vegetable juice (not low-sodium or reduced-sodium). Pickles. Olives. Fruits Canned fruit in a light or heavy syrup. Fried fruit. Fruit in cream or butter sauce. Meat and other protein foods Fatty cuts of meat. Ribs. Fried meat. Bacon. Sausage. Bologna and other processed lunch meats. Salami. Fatback. Hotdogs. Bratwurst. Salted nuts and seeds. Canned beans with added salt. Canned or smoked fish. Whole eggs or egg yolks. Chicken or turkey with skin. Dairy Whole or 2% milk, cream, and half-and-half. Whole or full-fat cream cheese. Whole-fat or sweetened yogurt. Full-fat cheese. Nondairy creamers. Whipped toppings.  Processed cheese and cheese spreads. Fats and oils Butter. Stick margarine. Lard. Shortening. Ghee. Bacon fat. Tropical oils, such as coconut, palm kernel, or palm oil. Seasoning and other foods Salted popcorn and pretzels. Onion salt, garlic salt, seasoned salt, table salt, and sea salt. Worcestershire sauce. Tartar sauce. Barbecue sauce. Teriyaki sauce. Soy sauce, including reduced-sodium. Steak sauce. Canned and packaged gravies. Fish sauce. Oyster sauce. Cocktail sauce. Horseradish that you find on the shelf. Ketchup. Mustard. Meat flavorings and tenderizers. Bouillon cubes. Hot sauce and Tabasco sauce. Premade or packaged marinades. Premade or packaged taco seasonings. Relishes. Regular salad dressings. Where to find more information:  National Heart, Lung, and Blood Institute: www.nhlbi.nih.gov  American Heart Association: www.heart.org Summary  The DASH eating plan is a healthy eating plan that has been shown to reduce high blood pressure (hypertension). It may also reduce your risk for type 2 diabetes, heart disease, and stroke.  With the DASH eating plan, you should limit salt (sodium) intake to 2,300 mg a day. If you have hypertension, you may need to reduce your sodium intake to 1,500 mg a day.  When on the DASH eating plan, aim to eat more fresh fruits and vegetables, whole grains, lean proteins, low-fat dairy, and heart-healthy fats.  Work with your health care provider or diet and nutrition specialist (dietitian) to adjust your eating plan to your individual   calorie needs. This information is not intended to replace advice given to you by your health care provider. Make sure you discuss any questions you have with your health care provider. Document Released: 01/30/2011 Document Revised: 02/04/2016 Document Reviewed: 02/04/2016 Elsevier Interactive Patient Education  2017 Elsevier Inc.  

## 2016-08-26 NOTE — Assessment & Plan Note (Signed)
Healthy Discussed fitness, healthy eating, etc Yearly flu vaccine Colon due 2020 Discussed PSA--will defer to next year

## 2016-09-02 DIAGNOSIS — Z96649 Presence of unspecified artificial hip joint: Secondary | ICD-10-CM | POA: Diagnosis not present

## 2016-09-04 ENCOUNTER — Other Ambulatory Visit: Payer: Self-pay

## 2016-09-04 MED ORDER — TAMSULOSIN HCL 0.4 MG PO CAPS: ORAL_CAPSULE | ORAL | 3 refills | Status: DC

## 2016-09-05 ENCOUNTER — Other Ambulatory Visit: Payer: Self-pay | Admitting: Internal Medicine

## 2016-09-06 ENCOUNTER — Encounter: Payer: Self-pay | Admitting: Internal Medicine

## 2016-09-06 NOTE — Telephone Encounter (Signed)
Last thyroid labs done 08/21/15.  Ok to refill?

## 2016-09-06 NOTE — Telephone Encounter (Signed)
Approved: okay for a year 

## 2016-09-08 MED ORDER — LEVOTHYROXINE SODIUM 75 MCG PO TABS: ORAL_TABLET | ORAL | 3 refills | Status: DC

## 2016-09-21 ENCOUNTER — Emergency Department: Payer: BLUE CROSS/BLUE SHIELD

## 2016-09-21 ENCOUNTER — Emergency Department
Admission: EM | Admit: 2016-09-21 | Discharge: 2016-09-21 | Disposition: A | Discharge status: Home / Self Care | Payer: BLUE CROSS/BLUE SHIELD | Attending: Emergency Medicine | Admitting: Emergency Medicine

## 2016-09-21 DIAGNOSIS — I10 Essential (primary) hypertension: Secondary | ICD-10-CM | POA: Diagnosis not present

## 2016-09-21 DIAGNOSIS — M25552 Pain in left hip: Secondary | ICD-10-CM | POA: Diagnosis not present

## 2016-09-21 DIAGNOSIS — Z79899 Other long term (current) drug therapy: Secondary | ICD-10-CM | POA: Diagnosis not present

## 2016-09-21 DIAGNOSIS — Z7982 Long term (current) use of aspirin: Secondary | ICD-10-CM | POA: Insufficient documentation

## 2016-09-21 DIAGNOSIS — E039 Hypothyroidism, unspecified: Secondary | ICD-10-CM | POA: Diagnosis not present

## 2016-09-21 DIAGNOSIS — Z96642 Presence of left artificial hip joint: Secondary | ICD-10-CM | POA: Diagnosis not present

## 2016-09-21 DIAGNOSIS — Z471 Aftercare following joint replacement surgery: Secondary | ICD-10-CM | POA: Diagnosis not present

## 2016-09-21 DIAGNOSIS — S79912A Unspecified injury of left hip, initial encounter: Secondary | ICD-10-CM | POA: Diagnosis not present

## 2016-09-21 MED ORDER — HYDROCODONE-ACETAMINOPHEN 5-325 MG PO TABS: 1.0000 | ORAL_TABLET | ORAL | 0 refills | Status: DC | PRN

## 2016-09-21 NOTE — ED Provider Notes (Signed)
[facility] Emergency Department Provider Note  Time seen: 1:16 PM  I have reviewed the triage vital signs and the nursing notes.   HISTORY  Chief Complaint Hip Pain    HPI Jason [name] is a 61 y.o. male with a past medical history of gastric reflux, hypertension, bilateral hip replacements, the left hip was replaced in May 2018, presents to the emergency department with left hip pain.According to the patient he was exiting his vehicle, pivoted both legs at the same time and attempted to standup felt a pop in the left hip with some pain with ambulation. Patient states over the past 2-3 days he has been experiencing some pain which he described as very mild in the left hip down to the left groin, somewhat worse with ambulation. He felt a pop today and was concerned that he may have disrupted something in the recently replaced hip so he came to the emergency department for evaluation. Patient denies any pain while lying in bed his lungs he is not moving.  Past Medical History:  Diagnosis Date  . Allergy   . Arthritis    osteoarthritis  . GERD (gastroesophageal reflux disease)   . Hypertension   . Thyroid disease    hypo    Patient Active Problem List   Diagnosis Date Noted  . Osteoarthritis of left hip 05/21/2016  . BPH without obstruction/lower urinary tract symptoms 11/07/2013  . Routine general medical examination at a health care facility 06/24/2010  . Hypothyroidism 06/19/2009  . OSTEOARTHRITIS 12/19/2008  . BACK PAIN, LUMBAR 02/26/2007  . Essential hypertension, benign 09/17/2006  . ALLERGIC RHINITIS 09/17/2006  . GERD 09/17/2006    Past Surgical History:  Procedure Laterality Date  . CARDIAC CATHETERIZATION  03-2000  . ROTATOR CUFF REPAIR  10/12   Dr [name] Heck  . TONSILLECTOMY    . 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 TOTAL HIP ARTHROPLASTY Left 06/2016   Dr Harlow Mares    Prior to Admission medications   Medication Sig Start Date End  Date Taking? Authorizing Provider  acetaminophen (TYLENOL) 325 MG tablet Take 650 mg by mouth 2 (two) times daily.      [provider]  aspirin 325 MG tablet Take 325 mg by mouth 2 (two) times daily.    [provider]  cetirizine (ZYRTEC) 10 MG tablet Take 10 mg by mouth daily.    [provider]  cyclobenzaprine (FLEXERIL) 10 MG tablet TAKE 1/2 TO 1 TABLET BY MOUTH TWICE DAILY AS NEEDED FOR MUSCLE SPASM 07/22/16   Viviana Simpler I, MD  docusate sodium (COLACE) 100 MG capsule Take 100 mg by mouth daily.    [provider]  levothyroxine (SYNTHROID, LEVOTHROID) 75 MCG tablet TAKE 1 TABLET (75 MCG TOTAL) BY MOUTH DAILY. 09/08/16   Venia Carbon, MD  lisinopril-hydrochlorothiazide (PRINZIDE,ZESTORETIC) 20-25 MG tablet TAKE ONE TABLET BY MOUTH ONCE DAILY 11/01/15   Venia Carbon, MD  loratadine (CLARITIN) 10 MG tablet Take 10 mg by mouth daily. Can take 2 tabs if needed     [provider]  Multiple Vitamin (MULTIVITAMIN) tablet Take 1 tablet by mouth daily.      [provider]  ranitidine (ZANTAC) 75 MG tablet Take 75 mg by mouth 2 (two) times daily.     [provider]  senna (SENOKOT) 8.6 MG tablet Take 1 tablet by mouth daily.    [provider]  sildenafil (REVATIO) 20 MG tablet Take 3-5 tablets (60-100 mg total)  by mouth daily as needed. 08/21/15   Venia Carbon, MD  tamsulosin (FLOMAX) 0.4 MG CAPS capsule TAKE 1 CAPSULE (0.4 MG TOTAL) BY MOUTH DAILY. 09/04/16   Venia Carbon, MD  traMADol (ULTRAM) 50 MG tablet Take 1 tablet by mouth 2 (two) times daily. 05/19/16   [provider]    No Known Allergies  Family History  Problem Relation Age of Onset  . Gout Father   . Arrhythmia Mother   . Hypertension Mother     Social History Social History  Substance Use Topics  . Smoking status: Never Smoker  . Smokeless tobacco: Never Used  . Alcohol use Yes    Review of Systems Constitutional:  Negative for Fall or head injury. Cardiovascular: Negative for chest pain. Gastrointestinal: Negative for abdominal pain Genitourinary: Negative for dysuria. Musculoskeletal: Left hip pain worse with movement. All other ROS negative  ____________________________________________   PHYSICAL EXAM:  VITAL SIGNS: ED Triage Vitals  Enc Vitals Group     BP 09/21/16 1232 (!) 153/93     Pulse Rate 09/21/16 1232 74     Resp 09/21/16 1232 16     Temp 09/21/16 1232 98.1 F (36.7 C)     Temp Source 09/21/16 1232 Oral     SpO2 09/21/16 1232 97 %     Weight 09/21/16 1228 210 lb (95.3 kg)     Height 09/21/16 1228 5\' 10"  (1.778 m)     Head Circumference --      Peak Flow --      Pain Score 09/21/16 1228 4     Pain Loc --      Pain Edu? --      Excl. in [place]? --     Constitutional: Alert and oriented. Well appearing and in no distress. Eyes: Normal exam ENT   Head: Normocephalic and atraumatic Cardiovascular: Normal rate, regular rhythm. No murmur Respiratory: Normal respiratory effort without tachypnea nor retractions. Breath sounds are clear Gastrointestinal: Soft and nontender. No distention Musculoskeletal: No hip tenderness to palpation, stable pelvis. Patient has good range of motion in both hips. Good range of motion in the left hip although he does experience some discomfort with internal rotation of the hip, no discomfort with external rotation or otherwise with range of motion. Neurologic:  Normal speech and language. No gross focal neurologic deficits  Skin:  Skin is warm, dry and intact.  Psychiatric: Mood and affect are normal.   ____________________________________________     RADIOLOGY  X-rays are negative.  ____________________________________________   INITIAL IMPRESSION / ASSESSMENT AND PLAN / ED COURSE  Pertinent labs & imaging results that were available during my care of the patient were reviewed by me and considered in my medical decision making (see  chart for details).  Patient presents to the emergency department with left hip discomfort after feeling a pop in the left hip while attempting to stand from his vehicle. Overall the patient appears well, good range of motion in hip without any tenderness elicited until you attempt to do internal rotation of the hip and then he has mild discomfort. Patient takes tramadol for his discomfort. At this time x-rays are negative. We will attempt ambulation in the emergency department.  X-rays are negative. Patient continues to have some discomfort with ambulation but is able to ambulate. Given the discomfort we'll obtain a CT scan. CT scan is negative for acute abnormality. We will discharge home with a short course of Norco. ____________________________________________   FINAL CLINICAL IMPRESSION(S) /  ED DIAGNOSES  Left hip pain    Harvest Dark, MD 09/21/16 1505

## 2016-09-21 NOTE — ED Triage Notes (Signed)
Pt presents to ED via POV in w/c with c/o LEFT hip pain x30 minutes. Pt reports no recent injury or trauma, was getting out of a car and felt LEFT hip "pop". Pt reports hip surgery on same side on May 18th. Pt is A&O, in NAD, RR even, regular, and unlabored.

## 2016-09-21 NOTE — ED Notes (Addendum)
Assisted pt to ambulate in room. Pt limps with every step. Pain is with every step. 7/10 with ambulation, 1/10 when sitting still. Pt has both cane and walker at home but has not used either in "awhile" since rehab.

## 2016-09-22 ENCOUNTER — Telehealth: Payer: Self-pay

## 2016-09-22 NOTE — Telephone Encounter (Signed)
Spoke to pt. He said he saw his surgeon today and he is doing much better.

## 2016-10-07 LAB — LIPID PANEL
Cholesterol: 157 (ref 0–200)
HDL: 48 (ref 35–70)
LDL Cholesterol: 98
Triglycerides: 55 (ref 40–160)

## 2016-10-07 LAB — BASIC METABOLIC PANEL: Glucose: 91

## 2016-10-10 DIAGNOSIS — Z96642 Presence of left artificial hip joint: Secondary | ICD-10-CM | POA: Diagnosis not present

## 2016-10-24 ENCOUNTER — Other Ambulatory Visit: Payer: Self-pay | Admitting: Internal Medicine

## 2016-11-26 ENCOUNTER — Encounter: Payer: Self-pay | Admitting: Internal Medicine

## 2016-11-27 NOTE — Telephone Encounter (Signed)
Please log the flu shot

## 2016-11-27 NOTE — Telephone Encounter (Signed)
I documented the flu shot. Have you responded to his other questions?

## 2016-12-02 ENCOUNTER — Telehealth: Payer: Self-pay | Admitting: *Deleted

## 2016-12-02 NOTE — Telephone Encounter (Signed)
Patient came in office and dropped off a [employer] Form that needs to be signed by Dr. Silvio Pate. Patient states you can call when it is completed . The form will be in the Rx tower. The patient contact number is [phone]

## 2016-12-02 NOTE — Telephone Encounter (Signed)
Form in Dr Letvak's Inbox on his desk 

## 2016-12-03 NOTE — Telephone Encounter (Signed)
Form done No charge 

## 2016-12-03 NOTE — Telephone Encounter (Signed)
I left a message on patient's voice mail.  Form is ready for pick up and no charge.

## 2017-01-14 ENCOUNTER — Other Ambulatory Visit: Payer: Self-pay | Admitting: Internal Medicine

## 2017-03-25 ENCOUNTER — Encounter: Payer: Self-pay | Admitting: Internal Medicine

## 2017-04-02 ENCOUNTER — Ambulatory Visit (INDEPENDENT_AMBULATORY_CARE_PROVIDER_SITE_OTHER): Payer: BLUE CROSS/BLUE SHIELD | Admitting: Family Medicine

## 2017-04-02 ENCOUNTER — Encounter: Payer: Self-pay | Admitting: Family Medicine

## 2017-04-02 ENCOUNTER — Other Ambulatory Visit: Payer: Self-pay

## 2017-04-02 ENCOUNTER — Telehealth: Payer: Self-pay | Admitting: Internal Medicine

## 2017-04-02 VITALS — BP 140/90 | HR 75 | Temp 98.4°F | Ht 68.75 in | Wt 208.8 lb

## 2017-04-02 DIAGNOSIS — B029 Zoster without complications: Secondary | ICD-10-CM

## 2017-04-02 DIAGNOSIS — R109 Unspecified abdominal pain: Secondary | ICD-10-CM

## 2017-04-02 LAB — POC URINALSYSI DIPSTICK (AUTOMATED)
Bilirubin, UA: NEGATIVE
Blood, UA: NEGATIVE
Glucose, UA: NEGATIVE
KETONES UA: NEGATIVE
LEUKOCYTES UA: NEGATIVE
Nitrite, UA: NEGATIVE
PH UA: 8.5 — AB (ref 5.0–8.0)
PROTEIN UA: NEGATIVE
Spec Grav, UA: 1.015 (ref 1.010–1.025)
Urobilinogen, UA: 0.2 E.U./dL

## 2017-04-02 MED ORDER — VALACYCLOVIR HCL 1 G PO TABS: 1000.0000 mg | ORAL_TABLET | Freq: Three times a day (TID) | ORAL | 0 refills | Status: DC

## 2017-04-02 NOTE — Telephone Encounter (Signed)
Copied from [place] [id number]. Topic: Quick Communication - Rx Refill/Question >> Apr 02, 2017 12:41 PM Marin Olp L wrote: Medication: Valtrex Has the patient contacted their pharmacy? Yes.   (Agent: If no, request that the patient contact the pharmacy for the refill.) Preferred Pharmacy (with phone number or street name): [facility] [place] [place] [facility] [address] [address] Phone: [phone] Fax: [phone] Agent: Please be advised that RX refills may take up to 3 business days. We ask that you follow-up with your pharmacy. Patient called pharm and it's not there. Please resend.

## 2017-04-02 NOTE — Patient Instructions (Signed)
Pain control is most important. Use ibuprofen 800 mg every eight hoiurs for pain. Call if pain not controlled with this.  Complete course of valacyclovir. Keep area covered as contagious until scabbed over.   Shingles Shingles, which is also known as herpes zoster, is an infection that causes a painful skin rash and fluid-filled blisters. Shingles is not related to genital herpes, which is a sexually transmitted infection. Shingles only develops in people who:  Have had chickenpox.  Have received the chickenpox vaccine. (This is rare.)  What are the causes? Shingles is caused by varicella-zoster virus (VZV). This is the same virus that causes chickenpox. After exposure to VZV, the virus stays in the body in an inactive (dormant) state. Shingles develops if the virus reactivates. This can happen many years after the initial exposure to VZV. It is not known what causes this virus to reactivate. What increases the risk? People who have had chickenpox or received the chickenpox vaccine are at risk for shingles. Infection is more common in people who:  Are older than age 43.  Have a weakened defense (immune) system, such as those with HIV, AIDS, or cancer.  Are taking medicines that weaken the immune system, such as transplant medicines.  Are under great stress.  What are the signs or symptoms? Early symptoms of this condition include itching, tingling, and pain in an area on your skin. Pain may be described as burning, stabbing, or throbbing. A few days or weeks after symptoms start, a painful red rash appears, usually on one side of the body in a bandlike or beltlike pattern. The rash eventually turns into fluid-filled blisters that break open, scab over, and dry up in about 2-3 weeks. At any time during the infection, you may also develop:  A fever.  Chills.  A headache.  An upset stomach.  How is this diagnosed? This condition is diagnosed with a skin exam. Sometimes, skin or  fluid samples are taken from the blisters before a diagnosis is made. These samples are examined under a microscope or sent to a lab for testing. How is this treated? There is no specific cure for this condition. Your health care provider will probably prescribe medicines to help you manage pain, recover more quickly, and avoid long-term problems. Medicines may include:  Antiviral drugs.  Anti-inflammatory drugs.  Pain medicines.  If the area involved is on your face, you may be referred to a specialist, such as an eye doctor (ophthalmologist) or an ear, nose, and throat (ENT) doctor to help you avoid eye problems, chronic pain, or disability. Follow these instructions at home: Medicines  Take medicines only as directed by your health care provider.  Apply an anti-itch or numbing cream to the affected area as directed by your health care provider. Blister and Rash Care  Take a cool bath or apply cool compresses to the area of the rash or blisters as directed by your health care provider. This may help with pain and itching.  Keep your rash covered with a loose bandage (dressing). Wear loose-fitting clothing to help ease the pain of material rubbing against the rash.  Keep your rash and blisters clean with mild soap and cool water or as directed by your health care provider.  Check your rash every day for signs of infection. These include redness, swelling, and pain that lasts or increases.  Do not pick your blisters.  Do not scratch your rash. General instructions  Rest as directed by your health care provider.  Keep all follow-up visits as directed by your health care provider. This is important.  Until your blisters scab over, your infection can cause chickenpox in people who have never had it or been vaccinated against it. To prevent this from happening, avoid contact with other people, especially: ? Babies. ? Pregnant women. ? Children who have eczema. ? Elderly people who  have transplants. ? People who have chronic illnesses, such as leukemia or AIDS. Contact a health care provider if:  Your pain is not relieved with prescribed medicines.  Your pain does not get better after the rash heals.  Your rash looks infected. Signs of infection include redness, swelling, and pain that lasts or increases. Get help right away if:  The rash is on your face or nose.  You have facial pain, pain around your eye area, or loss of feeling on one side of your face.  You have ear pain or you have ringing in your ear.  You have loss of taste.  Your condition gets worse. This information is not intended to replace advice given to you by your health care provider. Make sure you discuss any questions you have with your health care provider. Document Released: 02/10/2005 Document Revised: 10/07/2015 Document Reviewed: 12/22/2013 Elsevier Interactive Patient Education  2018 [employer].

## 2017-04-02 NOTE — Assessment & Plan Note (Signed)
Treat with antiviral and pain control. Discussed course and prevention of spread.

## 2017-04-02 NOTE — Progress Notes (Signed)
   Subjective:    Patient ID: Jason [name], male    DOB: May 03, 1955, 62 y.o.   MRN: [id number]  HPI    62 year old male presents with new onset pain in right flank and right misdbackback.  He reports the pain started  1 week ago. .. Pain started on right midback, then few days later pain radiated to right flank. Started as burning pain.  IN last 24 hours pain became stabbing pain.. Severe. 6/10 on pain scale  Now pain has decreased to a dull aching pain 1-2  On pain scale.   He also noted  Blistering skin rash  (noted appearance 2 days after pain)on right anterior abdomen, not itchy, sore. Skin is sensitive to touch.   He has history of shingles.  No V/D, slight nausea yesterday when pain was greater. Used acetaminophen. Helped some No dysuria, no blood in urine. No change in urgency and frequency. He does feel more tired in last few days. No fever.   No change in activity  No new meds.  Hx of kidney stone and shingles in past. Social History /Family History/Past Medical History reviewed in detail and updated in EMR if needed. Blood pressure 140/90, pulse 75, temperature 98.4 F (36.9 C), temperature source Oral, height 5' 8.75" (1.746 m), weight 208 lb 12 oz (94.7 kg).  Review of Systems  Constitutional: Positive for fatigue. Negative for fever.  HENT: Negative for ear pain.   Eyes: Negative for pain.  Respiratory: Negative for cough and shortness of breath.   Cardiovascular: Negative for chest pain, palpitations and leg swelling.  Gastrointestinal: Positive for abdominal pain. Negative for blood in stool, constipation and diarrhea.  Genitourinary: Negative for dysuria.  Musculoskeletal: Negative for arthralgias.  Neurological: Negative for syncope, light-headedness and headaches.  Psychiatric/Behavioral: Negative for dysphoric mood.       Objective:   Physical Exam  Constitutional: Vital signs are normal. He appears well-developed and well-nourished.  HENT:  Head:  Normocephalic.  Right Ear: Hearing normal.  Left Ear: Hearing normal.  Nose: Nose normal.  Mouth/Throat: Oropharynx is clear and moist and mucous membranes are normal.  Neck: Trachea normal. Carotid bruit is not present. No thyroid mass and no thyromegaly present.  Cardiovascular: Normal rate, regular rhythm and normal pulses. Exam reveals no gallop, no distant heart sounds and no friction rub.  No murmur heard. No peripheral edema  Pulmonary/Chest: Effort normal and breath sounds normal. No respiratory distress.  Abdominal: There is hepatosplenomegaly. There is no tenderness. There is no rigidity, no rebound, no guarding and no CVA tenderness.  Musculoskeletal:       Lumbar back: Normal. He exhibits normal range of motion and no tenderness.  Skin: Skin is warm, dry and intact. No rash noted.     Unilateral blistering rash and skin sensitivity along  Dermatome on right  Psychiatric: He has a normal mood and affect. His speech is normal and behavior is normal. Thought content normal.          Assessment & Plan:

## 2017-04-02 NOTE — Telephone Encounter (Signed)
[facility] called and spoke with Elmyra Ricks, she verified that the prescription for Valtrex was not received today, advised it will be sent again.

## 2017-04-02 NOTE — Telephone Encounter (Signed)
I spoke with Jason [name] at [facility] [address]; valtrex refill has not been received; cannot leave verbal order because pharmacist is on lunch; advised to cb and [facility] Done. Pt also notified and pt voiced understanding.

## 2017-06-18 ENCOUNTER — Other Ambulatory Visit: Payer: Self-pay | Admitting: Internal Medicine

## 2017-06-19 NOTE — Telephone Encounter (Signed)
Approved:  #[id number] x 0 

## 2017-08-11 DIAGNOSIS — Z96649 Presence of unspecified artificial hip joint: Secondary | ICD-10-CM | POA: Diagnosis not present

## 2017-08-28 ENCOUNTER — Encounter: Payer: Self-pay | Admitting: Internal Medicine

## 2017-08-28 ENCOUNTER — Ambulatory Visit (INDEPENDENT_AMBULATORY_CARE_PROVIDER_SITE_OTHER): Payer: BLUE CROSS/BLUE SHIELD | Admitting: Internal Medicine

## 2017-08-28 VITALS — BP 124/90 | HR 72 | Temp 97.7°F | Ht 68.5 in | Wt 203.0 lb

## 2017-08-28 DIAGNOSIS — Z0000 Encounter for general adult medical examination without abnormal findings: Secondary | ICD-10-CM | POA: Diagnosis not present

## 2017-08-28 DIAGNOSIS — N400 Enlarged prostate without lower urinary tract symptoms: Secondary | ICD-10-CM | POA: Diagnosis not present

## 2017-08-28 DIAGNOSIS — I10 Essential (primary) hypertension: Secondary | ICD-10-CM

## 2017-08-28 DIAGNOSIS — E039 Hypothyroidism, unspecified: Secondary | ICD-10-CM | POA: Diagnosis not present

## 2017-08-28 NOTE — Assessment & Plan Note (Signed)
BP Readings from Last 3 Encounters:  08/28/17 124/90  04/02/17 140/90  09/21/16 (!) 155/91   High at first Will have him monitor Check labs

## 2017-08-28 NOTE — Addendum Note (Signed)
Addended by: Ellamae Sia on: 08/28/2017 09:56 AM   Modules accepted: Orders

## 2017-08-28 NOTE — Assessment & Plan Note (Signed)
Healthy Really working on fitness Yearly flu vaccine at work Consider shingrix in future Colon due 4/20 Will check PSA after discussion

## 2017-08-28 NOTE — Assessment & Plan Note (Signed)
Fine on Rx

## 2017-08-28 NOTE — Progress Notes (Signed)
Subjective:    Patient ID: Jason [name], male    DOB: Sep 22, 1955, 62 y.o.   MRN: [id number]  HPI Here for physical  Not checking BP No symptoms Has been going to gym 5 days per week  Hips are good Had surgery follow up--- all is good  Current Outpatient Medications on File Prior to Visit  Medication Sig Dispense Refill  . acetaminophen (TYLENOL) 325 MG tablet Take 650 mg by mouth 2 (two) times daily.      . cetirizine (ZYRTEC) 10 MG tablet Take 10 mg by mouth daily.    . cyclobenzaprine (FLEXERIL) 10 MG tablet TAKE 1/2 TO 1 TABLET BY MOUTH TWICE DAILY AS NEEDED FOR MUSCLE SPASM (Patient taking differently: 1 nightly) 180 tablet 0  . ibuprofen (ADVIL,MOTRIN) 200 MG tablet Take 400 mg by mouth daily.     Marland Kitchen levothyroxine (SYNTHROID, LEVOTHROID) 75 MCG tablet TAKE 1 TABLET (75 MCG TOTAL) BY MOUTH DAILY. 90 tablet 3  . lisinopril-hydrochlorothiazide (PRINZIDE,ZESTORETIC) 20-25 MG tablet TAKE ONE TABLET BY MOUTH ONCE DAILY 90 tablet 3  . loratadine (CLARITIN) 10 MG tablet Take 10 mg by mouth daily. Can take 2 tabs if needed     . Multiple Vitamin (MULTIVITAMIN) tablet Take 1 tablet by mouth daily.      . ranitidine (ZANTAC) 75 MG tablet Take 75 mg by mouth 2 (two) times daily.     . tamsulosin (FLOMAX) 0.4 MG CAPS capsule TAKE 1 CAPSULE (0.4 MG TOTAL) BY MOUTH DAILY. 90 capsule 3   No current facility-administered medications on file prior to visit.     No Known Allergies  Past Medical History:  Diagnosis Date  . Allergy   . Arthritis    osteoarthritis  . GERD (gastroesophageal reflux disease)   . Hypertension   . Thyroid disease    hypo    Past Surgical History:  Procedure Laterality Date  . CARDIAC CATHETERIZATION  03-2000  . ROTATOR CUFF REPAIR  10/12   Dr [name] Heck  . TONSILLECTOMY    . 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 TOTAL HIP ARTHROPLASTY Left 06/2016   Dr Harlow Mares    Family History  Problem Relation Age of Onset  . Gout Father   . Arrhythmia Mother    . Hypertension Mother     Social History   Socioeconomic History  . Marital status: Married    Spouse name: Not on file  . Number of children: 0  . Years of education: Not on file  . Highest education level: Not on file  Occupational History  . Occupation: Engineer, materials: [employer]  Social Needs  . Financial resource strain: Not on file  . Food insecurity:    Worry: Not on file    Inability: Not on file  . Transportation needs:    Medical: Not on file    Non-medical: Not on file  Tobacco Use  . Smoking status: Never Smoker  . Smokeless tobacco: Never Used  Substance and Sexual Activity  . Alcohol use: Yes  . Drug use: No  . Sexual activity: Not on file  Lifestyle  . Physical activity:    Days per week: Not on file    Minutes per session: Not on file  . Stress: Not on file  Relationships  . Social connections:    Talks on phone: Not on file    Gets together: Not on file    Attends religious service: Not on file    Active member  of club or organization: Not on file    Attends meetings of clubs or organizations: Not on file    Relationship status: Not on file  . Intimate partner violence:    Fear of current or ex partner: Not on file    Emotionally abused: Not on file    Physically abused: Not on file    Forced sexual activity: Not on file  Other Topics Concern  . Not on file  Social History Narrative  . Not on file   Review of Systems  Constitutional:       Weight down 7# in 1 year---exercise and gave up soda Wears seat belt  HENT: Negative for hearing loss and tinnitus.        Keeps up with dentist---recent tooth extraction  Eyes: Negative for visual disturbance.       No diplopia or unilateral vision loss  Respiratory: Negative for cough, chest tightness and shortness of breath.   Cardiovascular: Negative for chest pain, palpitations and leg swelling.  Gastrointestinal: Negative for blood in stool and constipation.       Rare heartburn    Endocrine: Negative for polydipsia and polyuria.  Genitourinary: Negative for difficulty urinating and urgency.       No ED  Musculoskeletal: Positive for back pain. Negative for arthralgias and joint swelling.  Skin: Negative for rash.       No suspicious lesions  Allergic/Immunologic: Positive for environmental allergies. Negative for immunocompromised state.       OTC meds in season  Neurological: Negative for dizziness, syncope, light-headedness and headaches.  Hematological: Negative for adenopathy. Does not bruise/bleed easily.  Psychiatric/Behavioral: Negative for dysphoric mood and sleep disturbance. The patient is not nervous/anxious.        Has been taking melatonin at night       Objective:   Physical Exam  Constitutional: He is oriented to person, place, and time. He appears well-developed. No distress.  HENT:  Head: Normocephalic and atraumatic.  Right Ear: External ear normal.  Left Ear: External ear normal.  Mouth/Throat: Oropharynx is clear and moist. No oropharyngeal exudate.  Eyes: Pupils are equal, round, and reactive to light. Conjunctivae are normal.  Neck: No thyromegaly present.  Cardiovascular: Normal rate, regular rhythm, normal heart sounds and intact distal pulses. Exam reveals no gallop.  No murmur heard. Respiratory: Effort normal and breath sounds normal. No respiratory distress. He has no wheezes. He has no rales.  GI: Soft. There is no tenderness.  Musculoskeletal: He exhibits no edema or tenderness.  Lymphadenopathy:    He has no cervical adenopathy.  Neurological: He is alert and oriented to person, place, and time.  Skin: No rash noted. No erythema.  Psychiatric: He has a normal mood and affect. His behavior is normal.           Assessment & Plan:

## 2017-08-28 NOTE — Assessment & Plan Note (Signed)
Seems euthyroid

## 2017-08-29 LAB — CBC WITH DIFFERENTIAL/PLATELET
Basophils Absolute: 0.1 10*3/uL (ref 0.0–0.2)
Basos: 1 %
EOS (ABSOLUTE): 0.2 10*3/uL (ref 0.0–0.4)
EOS: 3 %
Hematocrit: 42.6 % (ref 37.5–51.0)
Hemoglobin: 14.7 g/dL (ref 13.0–17.7)
IMMATURE GRANULOCYTES: 0 %
Immature Grans (Abs): 0 10*3/uL (ref 0.0–0.1)
Lymphocytes Absolute: 1.6 10*3/uL (ref 0.7–3.1)
Lymphs: 25 %
MCH: 31.5 pg (ref 26.6–33.0)
MCHC: 34.5 g/dL (ref 31.5–35.7)
MCV: 91 fL (ref 79–97)
MONOCYTES: 7 %
Monocytes Absolute: 0.4 10*3/uL (ref 0.1–0.9)
Neutrophils Absolute: 4.1 10*3/uL (ref 1.4–7.0)
Neutrophils: 64 %
PLATELETS: 278 10*3/uL (ref 150–450)
RBC: 4.67 x10E6/uL (ref 4.14–5.80)
RDW: 13.2 % (ref 12.3–15.4)
WBC: 6.3 10*3/uL (ref 3.4–10.8)

## 2017-08-29 LAB — TSH: TSH: 8.86 u[IU]/mL — AB (ref 0.450–4.500)

## 2017-08-29 LAB — COMPREHENSIVE METABOLIC PANEL
ALBUMIN: 4.3 g/dL (ref 3.5–5.5)
ALT: 29 IU/L (ref 0–44)
AST: 39 IU/L (ref 0–40)
Albumin/Globulin Ratio: 1.6 (ref 1.2–2.2)
Alkaline Phosphatase: 88 IU/L (ref 39–117)
BUN / CREAT RATIO: 14 (ref 9–20)
BUN: 13 mg/dL (ref 6–24)
Bilirubin Total: 0.7 mg/dL (ref 0.0–1.2)
CALCIUM: 9.5 mg/dL (ref 8.7–10.2)
CO2: 27 mmol/L (ref 20–29)
CREATININE: 0.93 mg/dL (ref 0.76–1.27)
Chloride: 100 mmol/L (ref 96–106)
GFR calc Af Amer: 104 mL/min/{1.73_m2} (ref >59)
GFR calc non Af Amer: 90 mL/min/{1.73_m2} (ref >59)
GLOBULIN, TOTAL: 2.7 g/dL (ref 1.5–4.5)
Glucose: 97 mg/dL (ref 65–99)
Potassium: 4.2 mmol/L (ref 3.5–5.2)
Sodium: 141 mmol/L (ref 134–144)
Total Protein: 7 g/dL (ref 6.0–8.5)

## 2017-08-29 LAB — PSA: Prostate Specific Ag, Serum: 1.4 ng/mL (ref 0.0–4.0)

## 2017-08-29 LAB — T4, FREE: Free T4: 0.82 ng/dL (ref 0.82–1.77)

## 2017-08-31 ENCOUNTER — Other Ambulatory Visit: Payer: Self-pay | Admitting: Internal Medicine

## 2017-09-12 ENCOUNTER — Other Ambulatory Visit: Payer: Self-pay | Admitting: Internal Medicine

## 2017-10-20 ENCOUNTER — Other Ambulatory Visit: Payer: Self-pay | Admitting: Internal Medicine

## 2017-12-17 ENCOUNTER — Other Ambulatory Visit: Payer: Self-pay | Admitting: Internal Medicine

## 2018-06-13 ENCOUNTER — Other Ambulatory Visit: Payer: Self-pay | Admitting: Internal Medicine

## 2018-06-14 NOTE — Telephone Encounter (Signed)
Last filled 03-18-18 #180 Last OV 08-28-17 Next OV 09-07-18 [facility]

## 2018-07-02 ENCOUNTER — Encounter: Payer: Self-pay | Admitting: Gastroenterology

## 2018-07-15 ENCOUNTER — Encounter: Payer: Self-pay | Admitting: Family Medicine

## 2018-07-15 ENCOUNTER — Other Ambulatory Visit: Payer: BLUE CROSS/BLUE SHIELD

## 2018-07-15 ENCOUNTER — Ambulatory Visit (INDEPENDENT_AMBULATORY_CARE_PROVIDER_SITE_OTHER): Payer: BLUE CROSS/BLUE SHIELD | Admitting: Family Medicine

## 2018-07-15 VITALS — Temp 95.1°F | Ht 68.5 in | Wt 204.0 lb

## 2018-07-15 DIAGNOSIS — N342 Other urethritis: Secondary | ICD-10-CM | POA: Diagnosis not present

## 2018-07-15 DIAGNOSIS — R300 Dysuria: Secondary | ICD-10-CM

## 2018-07-15 LAB — POC URINALSYSI DIPSTICK (AUTOMATED)
Bilirubin, UA: NEGATIVE
Blood, UA: NEGATIVE
Glucose, UA: NEGATIVE
Ketones, UA: NEGATIVE
Leukocytes, UA: NEGATIVE
Nitrite, UA: NEGATIVE
Protein, UA: NEGATIVE
Spec Grav, UA: 1.01 (ref 1.010–1.025)
Urobilinogen, UA: 0.2 E.U./dL
pH, UA: 7 (ref 5.0–8.0)

## 2018-07-15 MED ORDER — AZITHROMYCIN 500 MG PO TABS: ORAL_TABLET | ORAL | 0 refills | Status: DC

## 2018-07-15 NOTE — Telephone Encounter (Signed)
With penile pain, potential penile, testicular, prostate infection I have to examine the patient's body.  Change to in-office appointment.

## 2018-07-15 NOTE — Telephone Encounter (Signed)
Pt said for 2 days pt has burning and pain upon urination with frequency and voiding small amts. Pt does not have video capability and has prod cough with clear to yellow phlegm, and H/A; pt thinks due to allergies and sinus. No fever 94.4 which was recked but pt said he runs low temp,no chills,S/T,SOB,muscle pain,diarrhea or loss of taste or smell. Pt has not traveled and no known exposure to covid or flu but pt works at [employer] but does wear mask and social distancing at work. Pt pt scheduled phone visit with Dr Lorelei Pont today at 11:40 with Dr Lorelei Pont and pt will be bringing urine specimen at 10 AM.pt will do phone visit unless receives call to do in office visit.Please advise.

## 2018-07-15 NOTE — Telephone Encounter (Signed)
Patient currently has productive cough so he can't be seen in office.  Dr. Lorelei Pont notified.  Will stay with phone visit.

## 2018-07-15 NOTE — Progress Notes (Signed)
     Hitesh Fouche T. Budd Freiermuth, MD Primary Care and Sports Medicine [facility] at [facility] [place], [address] Phone: [phone]  FAX: [phone]  RIKER COLLIER - 63 y.o. male  MRN [id number]  Date of Birth: October 12, 1955  Visit Date: 07/15/2018  PCP: Venia Carbon, MD  Referred by: Venia Carbon, MD  Virtual Visit via Telephone Note:  I connected with  [name] on 07/15/2018 11:40 AM EDT by telephone and verified that I am speaking with the correct person using two identifiers.   Location patient: home phone or cell phone Location provider: work or home office Consent: Verbal consent directly obtained from [name] and that there may be a patient responsible charge related to this service. Persons participating in the virtual visit: patient, provider  I discussed the limitations of evaluation and management by telemedicine and the availability of in person appointments.  The patient expressed understanding and agreed to proceed.     History of Present Illness:  He is a pleasant guy and he has been having pain at the end of his penis with some dysuria and urgency for the last 2 days.  He is not having any penile discharge and he has no sores or ulcers.  He has been happily married for many years and has been monogamous with his wife for many years.  He is not having any penile discharge.  Having any pain in the testicles or any pain in the perineal region.  Review of Systems: pertinent positives and pertinent negatives as per HPI No acute distress verbally  Past Medical History, Surgical History, Social History, Family History, Problem List, Medications, and Allergies have been reviewed and updated if relevant.   Observations/Objective/Exam:  An attempt was made to discern vital signs over the phone and per patient if applicable and possible.   Pulmonary:     Effort: Pulmonary effort is normal. No respiratory  distress.  Neurological:     Mental Status: He is alert and oriented to person, place, and time.  Psychiatric:        Thought Content: Thought content normal.        Judgment: Judgment normal.   Assessment and Plan:  Urethritis  Dysuria - Plan: POCT Urinalysis Dipstick (Automated)  >12 minutes spent on the phone with patient, >50% spent in counselling or coordination of care   Historically without exam, the patient has a history most consistent with urethritis.  He has no risk factors for gonococcal or chlamydial urethritis.  Presumptive treatment with 1000 mg of azithromycin.  If he has treatment failure, recommended follow-up in the office for further assessment and treatment.  I discussed the assessment and treatment plan with the patient. The patient was provided an opportunity to ask questions and all were answered. The patient agreed with the plan and demonstrated an understanding of the instructions.   The patient was advised to call back or seek an in-person evaluation if the symptoms worsen or if the condition fails to improve as anticipated.  Follow-up: prn unless noted otherwise below No follow-ups on file.  Meds ordered this encounter  Medications  . azithromycin (ZITHROMAX) 500 MG tablet    Sig: 2 tablets at one time once only (1000 mg)    Dispense:  2 tablet    Refill:  0   Orders Placed This Encounter  Procedures  . POCT Urinalysis Dipstick (Automated)    Signed,  Navarre Diana T. Daleah Coulson, MD

## 2018-07-15 NOTE — Addendum Note (Signed)
Addended by: Ellamae Sia on: 07/15/2018 02:18 PM   Modules accepted: Orders

## 2018-07-15 NOTE — Telephone Encounter (Signed)
Per dr copland with upper resp symptoms pt cannot come in office

## 2018-07-15 NOTE — Addendum Note (Signed)
Addended by: Carter Kitten on: 07/15/2018 02:12 PM   Modules accepted: Orders

## 2018-07-17 LAB — URINE CULTURE: Organism ID, Bacteria: NO GROWTH

## 2018-07-27 ENCOUNTER — Ambulatory Visit (AMBULATORY_SURGERY_CENTER): Payer: Self-pay

## 2018-07-27 ENCOUNTER — Other Ambulatory Visit: Payer: Self-pay

## 2018-07-27 VITALS — Ht 68.5 in | Wt 204.0 lb

## 2018-07-27 DIAGNOSIS — Z1211 Encounter for screening for malignant neoplasm of colon: Secondary | ICD-10-CM

## 2018-07-27 MED ORDER — NA SULFATE-K SULFATE-MG SULF 17.5-3.13-1.6 GM/177ML PO SOLN: 1.0000 | Freq: Once | ORAL | 0 refills | Status: AC

## 2018-07-27 NOTE — Progress Notes (Signed)
Denies allergies to eggs or soy products. Denies complication of anesthesia or sedation. Denies use of weight loss medication. Denies use of O2.   Emmi instructions given for colonoscopy.  Pre-Visit was conducted by phone due to Covid 19. Instructions were reviewed and mailed to patients home address. A 15.00 coupon for Suprep was given to the patient.  Patient was encouraged to call if he had any questions or concerns regarding instructions.

## 2018-08-09 ENCOUNTER — Telehealth: Payer: Self-pay | Admitting: *Deleted

## 2018-08-09 NOTE — Telephone Encounter (Signed)
Covid-19 screening questions   Do you now or have you had a fever in the last 14 days?no  Do you have any respiratory symptoms of shortness of breath or cough now or in the last 14 days?no  Do you have any family members or close contacts with diagnosed or suspected Covid-19 in the past 14 days?no  Have you been tested for Covid-19 and found to be positive?no  Spoke with patients wife and she will bring a mask with her and sit in the lobby and the patient will bring mask with him. Sm

## 2018-08-10 ENCOUNTER — Other Ambulatory Visit: Payer: Self-pay

## 2018-08-10 ENCOUNTER — Encounter: Payer: Self-pay | Admitting: Gastroenterology

## 2018-08-10 ENCOUNTER — Ambulatory Visit (AMBULATORY_SURGERY_CENTER): Payer: BLUE CROSS/BLUE SHIELD | Admitting: Gastroenterology

## 2018-08-10 VITALS — BP 154/95 | HR 70 | Temp 97.7°F | Resp 16 | Ht 68.5 in | Wt 204.0 lb

## 2018-08-10 DIAGNOSIS — Z1211 Encounter for screening for malignant neoplasm of colon: Secondary | ICD-10-CM

## 2018-08-10 MED ORDER — SODIUM CHLORIDE 0.9 % IV SOLN: 500.0000 mL | Freq: Once | INTRAVENOUS | Status: DC

## 2018-08-10 NOTE — Patient Instructions (Signed)
Impression/Recommendations:  Diverticulosis handout given to patient.  Resume previous diet. Continue present medications.  Repeat colonoscopy in 10 years for screening purposes.  YOU HAD AN ENDOSCOPIC PROCEDURE TODAY AT [facility]:   Refer to the procedure report that was given to you for any specific questions about what was found during the examination.  If the procedure report does not answer your questions, please call your gastroenterologist to clarify.  If you requested that your care partner not be given the details of your procedure findings, then the procedure report has been included in a sealed envelope for you to review at your convenience later.  YOU SHOULD EXPECT: Some feelings of bloating in the abdomen. Passage of more gas than usual.  Walking can help get rid of the air that was put into your GI tract during the procedure and reduce the bloating. If you had a lower endoscopy (such as a colonoscopy or flexible sigmoidoscopy) you may notice spotting of blood in your stool or on the toilet paper. If you underwent a bowel prep for your procedure, you may not have a normal bowel movement for a few days.  Please Note:  You might notice some irritation and congestion in your nose or some drainage.  This is from the oxygen used during your procedure.  There is no need for concern and it should clear up in a day or so.  SYMPTOMS TO REPORT IMMEDIATELY:   Following lower endoscopy (colonoscopy or flexible sigmoidoscopy):  Excessive amounts of blood in the stool  Significant tenderness or worsening of abdominal pains  Swelling of the abdomen that is new, acute  Fever of 100F or higher For urgent or emergent issues, a gastroenterologist can be reached at any hour by calling [phone].   DIET:  We do recommend a small meal at first, but then you may proceed to your regular diet.  Drink plenty of fluids but you should avoid alcoholic beverages for 24  hours.  ACTIVITY:  You should plan to take it easy for the rest of today and you should NOT DRIVE or use heavy machinery until tomorrow (because of the sedation medicines used during the test).    FOLLOW UP: Our staff will call the number listed on your records 48-72 hours following your procedure to check on you and address any questions or concerns that you may have regarding the information given to you following your procedure. If we do not reach you, we will leave a message.  We will attempt to reach you two times.  During this call, we will ask if you have developed any symptoms of COVID 19. If you develop any symptoms (ie: fever, flu-like symptoms, shortness of breath, cough etc.) before then, please call [phone].  If you test positive for Covid 19 in the 2 weeks post procedure, please call and report this information to [place].    If any biopsies were taken you will be contacted by phone or by letter within the next 1-3 weeks.  Please call us at [phone] if you have not heard about the biopsies in 3 weeks.    SIGNATURES/CONFIDENTIALITY: You and/or your care partner have signed paperwork which will be entered into your electronic medical record.  These signatures attest to the fact that that the information above on your After Visit Summary has been reviewed and is understood.  Full responsibility of the confidentiality of this discharge information lies with you and/or your care-partner.

## 2018-08-10 NOTE — Progress Notes (Signed)
A/ox3, pleased with MAC, report to RN 

## 2018-08-10 NOTE — Progress Notes (Signed)
Pt's states no medical or surgical changes since previsit or office visit. 

## 2018-08-10 NOTE — Op Note (Signed)
[facility] Patient Name: Raylyn Speckman Procedure Date: 08/10/2018 2:50 PM MRN: [id number] Endoscopist: Mallie Mussel L. Loletha Carrow , MD Age: 63 Referring MD:  Date of Birth: 26-Feb-1955 Gender: Male Account #: [id number] Procedure:                Colonoscopy Indications:              Screening for colorectal malignant neoplasm ( no                            polyps last colonoscopy 2010) Medicines:                Monitored Anesthesia Care Procedure:                Pre-Anesthesia Assessment:                           - Prior to the procedure, a History and Physical                            was performed, and patient medications and                            allergies were reviewed. The patient's tolerance of                            previous anesthesia was also reviewed. The risks                            and benefits of the procedure and the sedation                            options and risks were discussed with the patient.                            All questions were answered, and informed consent                            was obtained. Prior Anticoagulants: The patient has                            taken no previous anticoagulant or antiplatelet                            agents. ASA Grade Assessment: II - A patient with                            mild systemic disease. After reviewing the risks                            and benefits, the patient was deemed in                            satisfactory condition to undergo the procedure.  After obtaining informed consent, the colonoscope                            was passed under direct vision. Throughout the                            procedure, the patient's blood pressure, pulse, and                            oxygen saturations were monitored continuously. The                            Colonoscope was introduced through the anus and                            advanced to the the cecum,  identified by                            appendiceal orifice and ileocecal valve. The                            colonoscopy was performed without difficulty. The                            patient tolerated the procedure well. The quality                            of the bowel preparation was good. The ileocecal                            valve, appendiceal orifice, and rectum were                            photographed. Scope In: 2:58:29 PM Scope Out: 3:10:00 PM Scope Withdrawal Time: 0 hours 8 minutes 34 seconds  Total Procedure Duration: 0 hours 11 minutes 31 seconds  Findings:                 The perianal and digital rectal examinations were                            normal.                           A few diverticula were found in the sigmoid colon.                           The exam was otherwise without abnormality on                            direct and retroflexion views. Complications:            No immediate complications. Estimated Blood Loss:     Estimated blood loss: none. Impression:               - Diverticulosis in the sigmoid colon.                           -  The examination was otherwise normal on direct                            and retroflexion views.                           - No specimens collected. Recommendation:           - Patient has a contact number available for                            emergencies. The signs and symptoms of potential                            delayed complications were discussed with the                            patient. Return to normal activities tomorrow.                            Written discharge instructions were provided to the                            patient.                           - Resume previous diet.                           - Continue present medications.                           - Repeat colonoscopy in 10 years for screening                            purposes. Dannon Nguyenthi L. Loletha Carrow, MD 08/10/2018 3:16:03  PM This report has been signed electronically.

## 2018-08-10 NOTE — Progress Notes (Signed)
Riki Sheer, LPN- Temp Rica Mote, CMA

## 2018-08-12 ENCOUNTER — Telehealth: Payer: Self-pay

## 2018-08-12 ENCOUNTER — Telehealth: Payer: Self-pay | Admitting: *Deleted

## 2018-08-12 NOTE — Telephone Encounter (Signed)
1. Have you developed a fever since your procedure? no  2.   Have you had an respiratory symptoms (SOB or cough) since your procedure? no  3.   Have you tested positive for COVID 19 since your procedure no  4.   Have you had any family members/close contacts diagnosed with the COVID 19 since your procedure?  no   If yes to any of these questions please route to Joylene John, RN and Alphonsa Gin, Therapist, sports.  Follow up Call-  Call back number 08/10/2018  Post procedure Call Back phone  # [phone]  Permission to leave phone message Yes  Some recent data might be hidden     Patient questions:  Do you have a fever, pain , or abdominal swelling? No. Pain Score  0 *  Have you tolerated food without any problems? Yes.    Have you been able to return to your normal activities? Yes.    Do you have any questions about your discharge instructions: Diet   No. Medications  No. Follow up visit  No.  Do you have questions or concerns about your Care? No.  Actions: * If pain score is 4 or above: No action needed, pain <4.

## 2018-08-12 NOTE — Telephone Encounter (Signed)
  Follow up Call-  Call back number 08/10/2018  Post procedure Call Back phone  # [phone]  Permission to leave phone message Yes  Some recent data might be hidden     Patient questions:  Message left to call us if necessary.

## 2018-08-28 ENCOUNTER — Other Ambulatory Visit: Payer: Self-pay | Admitting: Internal Medicine

## 2018-09-05 ENCOUNTER — Other Ambulatory Visit: Payer: Self-pay | Admitting: Internal Medicine

## 2018-09-07 ENCOUNTER — Encounter: Payer: BLUE CROSS/BLUE SHIELD | Admitting: Internal Medicine

## 2018-10-05 ENCOUNTER — Ambulatory Visit (INDEPENDENT_AMBULATORY_CARE_PROVIDER_SITE_OTHER): Payer: BC Managed Care – PPO | Admitting: Internal Medicine

## 2018-10-05 ENCOUNTER — Encounter: Payer: Self-pay | Admitting: Internal Medicine

## 2018-10-05 ENCOUNTER — Other Ambulatory Visit: Payer: Self-pay

## 2018-10-05 VITALS — BP 140/86 | HR 77 | Temp 98.1°F | Ht 69.0 in | Wt 205.0 lb

## 2018-10-05 DIAGNOSIS — I10 Essential (primary) hypertension: Secondary | ICD-10-CM

## 2018-10-05 DIAGNOSIS — E039 Hypothyroidism, unspecified: Secondary | ICD-10-CM

## 2018-10-05 DIAGNOSIS — Z0000 Encounter for general adult medical examination without abnormal findings: Secondary | ICD-10-CM

## 2018-10-05 DIAGNOSIS — N400 Enlarged prostate without lower urinary tract symptoms: Secondary | ICD-10-CM | POA: Diagnosis not present

## 2018-10-05 LAB — COMPREHENSIVE METABOLIC PANEL
ALT: 27 U/L (ref 0–53)
AST: 32 U/L (ref 0–37)
Albumin: 4.4 g/dL (ref 3.5–5.2)
Alkaline Phosphatase: 73 U/L (ref 39–117)
BUN: 9 mg/dL (ref 6–23)
CO2: 32 mEq/L (ref 19–32)
Calcium: 9.8 mg/dL (ref 8.4–10.5)
Chloride: 99 mEq/L (ref 96–112)
Creatinine, Ser: 0.76 mg/dL (ref 0.40–1.50)
GFR: 104.32 mL/min (ref >60.00)
Glucose, Bld: 90 mg/dL (ref 70–99)
Potassium: 3.8 mEq/L (ref 3.5–5.1)
Sodium: 138 mEq/L (ref 135–145)
Total Bilirubin: 1.5 mg/dL — ABNORMAL HIGH (ref 0.2–1.2)
Total Protein: 7.1 g/dL (ref 6.0–8.3)

## 2018-10-05 LAB — CBC
HCT: 44.5 % (ref 39.0–52.0)
Hemoglobin: 15.3 g/dL (ref 13.0–17.0)
MCHC: 34.4 g/dL (ref 30.0–36.0)
MCV: 95.2 fl (ref 78.0–100.0)
Platelets: 261 10*3/uL (ref 150.0–400.0)
RBC: 4.67 Mil/uL (ref 4.22–5.81)
RDW: 13.7 % (ref 11.5–15.5)
WBC: 6.2 10*3/uL (ref 4.0–10.5)

## 2018-10-05 LAB — T4, FREE: Free T4: 0.71 ng/dL (ref 0.60–1.60)

## 2018-10-05 LAB — TSH: TSH: 7.45 u[IU]/mL — ABNORMAL HIGH (ref 0.35–4.50)

## 2018-10-05 MED ORDER — SILDENAFIL CITRATE 20 MG PO TABS: 60.0000 mg | ORAL_TABLET | Freq: Every day | ORAL | 11 refills | Status: DC | PRN

## 2018-10-05 NOTE — Assessment & Plan Note (Signed)
Seems euthyroid ?Will check labs ?

## 2018-10-05 NOTE — Assessment & Plan Note (Signed)
Okay on the tamsulosin 

## 2018-10-05 NOTE — Assessment & Plan Note (Signed)
Healthy Working on fitness Flu vaccine in 1-2 months Defer PSA to next year Just had colon--due again 2030

## 2018-10-05 NOTE — Progress Notes (Signed)
Subjective:    Patient ID: Jason [name], male    DOB: [birth year], 63 y.o.   MRN: [id number]  HPI Here for physical  Doing fairly well overall Urinary symptoms did get better with azithromycin Urine flow was slower during that time No recurrent issues Interested in trying sildenafil as well  Only regular back pain Uses the flexeril most nights to help sleep Most mornings awakens without pain  Current Outpatient Medications on File Prior to Visit  Medication Sig Dispense Refill  . acetaminophen (TYLENOL) 325 MG tablet Take 650 mg by mouth 2 (two) times daily as needed.     . Ascorbic Acid (VITA-C PO) Take 1,000 mg by mouth daily.    . cetirizine (ZYRTEC) 10 MG tablet Take 10 mg by mouth daily.    Marland Kitchen CRANBERRY PO Take 1 tablet by mouth daily.    . cyclobenzaprine (FLEXERIL) 10 MG tablet TAKE 1 TABLET BY MOUTH NIGHTLY AS NEEDED 90 tablet 1  . levothyroxine (SYNTHROID) 75 MCG tablet TAKE 1 TABLET BY MOUTH EVERY DAY 90 tablet 3  . lisinopril-hydrochlorothiazide (PRINZIDE,ZESTORETIC) 20-25 MG tablet TAKE 1 TABLET BY MOUTH ONCE DAILY 90 tablet 3  . loratadine (CLARITIN) 10 MG tablet Take 10 mg by mouth daily. Can take 2 tabs if needed     . Multiple Vitamin (MULTIVITAMIN) tablet Take 1 tablet by mouth daily.      . tamsulosin (FLOMAX) 0.4 MG CAPS capsule TAKE 1 CAPSULE BY MOUTH EVERY DAY 90 capsule 0   No current facility-administered medications on file prior to visit.     No Known Allergies  Past Medical History:  Diagnosis Date  . Allergy   . Arthritis    osteoarthritis  . GERD (gastroesophageal reflux disease)   . Hypertension   . Thyroid disease    hypo    Past Surgical History:  Procedure Laterality Date  . CARDIAC CATHETERIZATION  03-2000  . ROTATOR CUFF REPAIR  10/12   Dr [name] Heck  . TONSILLECTOMY    . 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 TOTAL HIP ARTHROPLASTY Left 06/2016   Dr Harlow Mares    Family History  Problem Relation Age of Onset  . Gout Father    . Arrhythmia Mother   . Hypertension Mother   . Colon cancer Neg Hx   . Esophageal cancer Neg Hx   . Rectal cancer Neg Hx   . Stomach cancer Neg Hx     Social History   Socioeconomic History  . Marital status: Married    Spouse name: Not on file  . Number of children: 0  . Years of education: Not on file  . Highest education level: Not on file  Occupational History  . Occupation: Engineer, materials: [employer]  Social Needs  . Financial resource strain: Not on file  . Food insecurity    Worry: Not on file    Inability: Not on file  . Transportation needs    Medical: Not on file    Non-medical: Not on file  Tobacco Use  . Smoking status: Never Smoker  . Smokeless tobacco: Never Used  Substance and Sexual Activity  . Alcohol use: Yes    Comment: Occasional wine  . Drug use: No  . Sexual activity: Not on file  Lifestyle  . Physical activity    Days per week: Not on file    Minutes per session: Not on file  . Stress: Not on file  Relationships  . Social connections  Talks on phone: Not on file    Gets together: Not on file    Attends religious service: Not on file    Active member of club or organization: Not on file    Attends meetings of clubs or organizations: Not on file    Relationship status: Not on file  . Intimate partner violence    Fear of current or ex partner: Not on file    Emotionally abused: Not on file    Physically abused: Not on file    Forced sexual activity: Not on file  Other Topics Concern  . Not on file  Social History Narrative  . Not on file   Review of Systems  Constitutional: Negative for fatigue and unexpected weight change.       Not as much exercise since COVID Wears seat belt  HENT: Negative for dental problem, hearing loss, tinnitus and trouble swallowing.        Keeps up with dentist  Eyes: Negative for visual disturbance.       No diplopia or unilateral vision loss  Respiratory: Negative for cough, chest tightness and  shortness of breath.   Cardiovascular: Negative for chest pain, palpitations and leg swelling.  Gastrointestinal: Negative for abdominal pain, blood in stool and constipation.       No heartburn  Endocrine: Negative for polydipsia and polyuria.  Genitourinary: Negative for difficulty urinating and urgency.  Musculoskeletal: Positive for back pain. Negative for joint swelling.       Some knee pain. Rare hip (muscle) pain  Allergic/Immunologic: Positive for environmental allergies. Negative for immunocompromised state.  Neurological: Negative for dizziness, syncope, light-headedness and headaches.  Hematological: Negative for adenopathy. Does not bruise/bleed easily.  Psychiatric/Behavioral: Negative for dysphoric mood and sleep disturbance. The patient is not nervous/anxious.        Objective:   Physical Exam  Constitutional: He is oriented to person, place, and time. He appears well-developed. No distress.  HENT:  Head: Normocephalic and atraumatic.  Right Ear: External ear normal.  Left Ear: External ear normal.  Mouth/Throat: Oropharynx is clear and moist. No oropharyngeal exudate.  Eyes: Pupils are equal, round, and reactive to light. Conjunctivae are normal.  Neck: No thyromegaly present.  Cardiovascular: Normal rate, regular rhythm, normal heart sounds and intact distal pulses. Exam reveals no gallop.  No murmur heard. Respiratory: Effort normal and breath sounds normal. No respiratory distress. He has no wheezes. He has no rales.  GI: Soft. There is no abdominal tenderness.  Musculoskeletal:        General: No tenderness or edema.  Lymphadenopathy:    He has no cervical adenopathy.  Neurological: He is alert and oriented to person, place, and time.  Skin: No rash noted. No erythema.  Psychiatric: He has a normal mood and affect. His behavior is normal.           Assessment & Plan:

## 2018-10-05 NOTE — Assessment & Plan Note (Signed)
BP Readings from Last 3 Encounters:  10/05/18 140/86  08/10/18 (!) 154/95  08/28/17 124/90   Reasonable control Will check labs

## 2018-10-17 ENCOUNTER — Other Ambulatory Visit: Payer: Self-pay | Admitting: Internal Medicine

## 2018-11-26 ENCOUNTER — Other Ambulatory Visit: Payer: Self-pay | Admitting: Internal Medicine

## 2018-12-06 ENCOUNTER — Other Ambulatory Visit: Payer: Self-pay | Admitting: Internal Medicine

## 2018-12-08 NOTE — Telephone Encounter (Signed)
Electronic refill request Cyclobenzaprine Last office visit 10/05/18 Last refill 06/14/18 #90/1  Dr. Silvio Pate is out of the office.

## 2018-12-14 ENCOUNTER — Other Ambulatory Visit: Payer: Self-pay | Admitting: *Deleted

## 2018-12-14 ENCOUNTER — Encounter: Payer: Self-pay | Admitting: Internal Medicine

## 2018-12-14 ENCOUNTER — Ambulatory Visit (INDEPENDENT_AMBULATORY_CARE_PROVIDER_SITE_OTHER): Payer: BC Managed Care – PPO | Admitting: Internal Medicine

## 2018-12-14 VITALS — BP 139/88 | HR 78 | Temp 97.1°F | Wt 203.0 lb

## 2018-12-14 DIAGNOSIS — R05 Cough: Secondary | ICD-10-CM

## 2018-12-14 DIAGNOSIS — J3489 Other specified disorders of nose and nasal sinuses: Secondary | ICD-10-CM

## 2018-12-14 DIAGNOSIS — H938X3 Other specified disorders of ear, bilateral: Secondary | ICD-10-CM | POA: Diagnosis not present

## 2018-12-14 DIAGNOSIS — J029 Acute pharyngitis, unspecified: Secondary | ICD-10-CM | POA: Diagnosis not present

## 2018-12-14 DIAGNOSIS — R519 Headache, unspecified: Secondary | ICD-10-CM

## 2018-12-14 DIAGNOSIS — Z20822 Contact with and (suspected) exposure to covid-19: Secondary | ICD-10-CM

## 2018-12-14 DIAGNOSIS — R059 Cough, unspecified: Secondary | ICD-10-CM

## 2018-12-14 NOTE — Progress Notes (Signed)
Virtual Visit via Telephone Note  I connected with Jason [name] on 12/14/18 at 11:30 AM EDT by telephone and verified that I am speaking with the correct person using two identifiers.  Location: Patient: Home Provider: Office   I discussed the limitations, risks, security and privacy concerns of performing an evaluation and management service by telephone and the availability of in person appointments. I also discussed with the patient that there may be a patient responsible charge related to this service. The patient expressed understanding and agreed to proceed.   History of Present Illness:  Pt reports headache, ear fullness, runny nose, nasal congestion, sore throat and cough. He reports theses symptoms have been intermittent over the last 3 weeks. The headache is located in his forehead. He describes the pain as pressure. He denies dizziness, visual changes, sensitivity to light or sound. He denies ear pain, drainage or loss of hearing. He is blowing yellow/green mucous out of his nose at times. He denies difficulty swallowing. The cough is productive of yellow/green mucous. He denies shortness of breath. He denies fever, chills or body aches. He has taken Claritin, Zyrtec and Benadryl with minimal relief. He has no known exposure to [place] but does work in Scientist, research (medical).    Past Medical History:  Diagnosis Date  . Allergy   . Arthritis    osteoarthritis  . GERD (gastroesophageal reflux disease)   . Hypertension   . Thyroid disease    hypo    Current Outpatient Medications  Medication Sig Dispense Refill  . acetaminophen (TYLENOL) 325 MG tablet Take 650 mg by mouth 2 (two) times daily as needed.     . Ascorbic Acid (VITA-C PO) Take 1,000 mg by mouth daily.    . cetirizine (ZYRTEC) 10 MG tablet Take 10 mg by mouth daily.    Marland Kitchen CRANBERRY PO Take 1 tablet by mouth daily.    . cyclobenzaprine (FLEXERIL) 10 MG tablet TAKE 1 TABLET BY MOUTH NIGHTLY AS NEEDED 90 tablet 0  . levothyroxine  (SYNTHROID) 75 MCG tablet TAKE 1 TABLET BY MOUTH EVERY DAY 90 tablet 3  . lisinopril-hydrochlorothiazide (ZESTORETIC) 20-25 MG tablet Take 1 tablet by mouth once daily 90 tablet 3  . loratadine (CLARITIN) 10 MG tablet Take 10 mg by mouth daily. Can take 2 tabs if needed     . Multiple Vitamin (MULTIVITAMIN) tablet Take 1 tablet by mouth daily.      . sildenafil (REVATIO) 20 MG tablet Take 3-5 tablets (60-100 mg total) by mouth daily as needed. 50 tablet 11  . tamsulosin (FLOMAX) 0.4 MG CAPS capsule TAKE 1 CAPSULE BY MOUTH EVERY DAY 90 capsule 3   No current facility-administered medications for this visit.     No Known Allergies  Family History  Problem Relation Age of Onset  . Gout Father   . Arrhythmia Mother   . Hypertension Mother   . Colon cancer Neg Hx   . Esophageal cancer Neg Hx   . Rectal cancer Neg Hx   . Stomach cancer Neg Hx     Social History   Socioeconomic History  . Marital status: Married    Spouse name: Not on file  . Number of children: 0  . Years of education: Not on file  . Highest education level: Not on file  Occupational History  . Occupation: Engineer, materials: [employer]  Social Needs  . Financial resource strain: Not on file  . Food insecurity    Worry: Not on file  Inability: Not on file  . Transportation needs    Medical: Not on file    Non-medical: Not on file  Tobacco Use  . Smoking status: Never Smoker  . Smokeless tobacco: Never Used  Substance and Sexual Activity  . Alcohol use: Yes    Comment: Occasional wine  . Drug use: No  . Sexual activity: Not on file  Lifestyle  . Physical activity    Days per week: Not on file    Minutes per session: Not on file  . Stress: Not on file  Relationships  . Social Herbalist on phone: Not on file    Gets together: Not on file    Attends religious service: Not on file    Active member of club or organization: Not on file    Attends meetings of clubs or organizations: Not on  file    Relationship status: Not on file  . Intimate partner violence    Fear of current or ex partner: Not on file    Emotionally abused: Not on file    Physically abused: Not on file    Forced sexual activity: Not on file  Other Topics Concern  . Not on file  Social History Narrative  . Not on file     Constitutional: Pt reports malaise, headache. Denies fever,  fatigue, or abrupt weight changes.  HEENT: Pt reports ear fullness, runny nose, nasal congestion and sore throat. Denies eye pain, eye redness, ear pain, ringing in the ears, wax buildup, bloody nose. Respiratory: Pt reports cough. Denies difficulty breathing, shortness of breath.   Cardiovascular: Denies chest pain, chest tightness, palpitations or swelling in the hands or feet.  Gastrointestinal: Denies abdominal pain, bloating, constipation, diarrhea or blood in the stool.   No other specific complaints in a complete review of systems (except as listed in HPI above).    Observations/Objective:  BP 139/88   Pulse 78   Temp (!) 97.1 F (36.2 C) (Oral)   Wt 203 lb (92.1 kg)   BMI 29.98 kg/m  Wt Readings from Last 3 Encounters:  12/14/18 203 lb (92.1 kg)  10/05/18 205 lb (93 kg)  08/10/18 204 lb (92.5 kg)    HEENT: Nose:Does not sound congested; Throat/Mouth: Does not sound hoarse. Neurological: Alert and oriented.   BMET    Component Value Date/Time   NA 138 10/05/2018 0827   NA 141 08/28/2017 0956   K 3.8 10/05/2018 0827   CL 99 10/05/2018 0827   CO2 32 10/05/2018 0827   GLUCOSE 90 10/05/2018 0827   BUN 9 10/05/2018 0827   BUN 13 08/28/2017 0956   CREATININE 0.76 10/05/2018 0827   CALCIUM 9.8 10/05/2018 0827   GFRNONAA 90 08/28/2017 0956   GFRAA 104 08/28/2017 0956    Lipid Panel     Component Value Date/Time   CHOL 157 10/07/2016   CHOL 152 08/18/2014 1121   TRIG 55 10/07/2016   HDL 48 10/07/2016   HDL 54 08/18/2014 1121   CHOLHDL 2.8 08/18/2014 1121   LDLCALC 98 10/07/2016   LDLCALC 88  08/18/2014 1121    CBC    Component Value Date/Time   WBC 6.2 10/05/2018 0827   RBC 4.67 10/05/2018 0827   HGB 15.3 10/05/2018 0827   HGB 14.7 08/28/2017 0956   HCT 44.5 10/05/2018 0827   HCT 42.6 08/28/2017 0956   PLT 261.0 10/05/2018 0827   PLT 278 08/28/2017 0956   MCV 95.2 10/05/2018 0827  MCV 91 08/28/2017 0956   MCH 31.5 08/28/2017 0956   MCHC 34.4 10/05/2018 0827   RDW 13.7 10/05/2018 0827   RDW 13.2 08/28/2017 0956   LYMPHSABS 1.6 08/28/2017 0956   EOSABS 0.2 08/28/2017 0956   BASOSABS 0.1 08/28/2017 0956    Hgb A1C No results found for: HGBA1C     Assessment and Plan:  Sinus Headache, Ear Fullness, Runny Nose, Nasal Congestion, Ear Fullness and Cough:  Could be allergies vs viral URI vs COVID 19 He will precede to the [facility] drive up test site for Novel Coronavirus testing Discussed the importance of social distancing, quarantine, masking and handwashing Discussed symptomatic treatment: rest, fluids, Ibuprofen, antihistamines, and Flonase OTC ER precautions discussed  Return precautions discussed  Follow Up Instructions:    I discussed the assessment and treatment plan with the patient. The patient was provided an opportunity to ask questions and all were answered. The patient agreed with the plan and demonstrated an understanding of the instructions.   The patient was advised to call back or seek an in-person evaluation if the symptoms worsen or if the condition fails to improve as anticipated.  Total time of 8:14 secs spent on this audio call  Webb Silversmith, NP

## 2018-12-15 LAB — NOVEL CORONAVIRUS, NAA: SARS-CoV-2, NAA: NOT DETECTED

## 2018-12-16 ENCOUNTER — Encounter: Payer: Self-pay | Admitting: Internal Medicine

## 2019-03-04 ENCOUNTER — Other Ambulatory Visit: Payer: Self-pay | Admitting: Family Medicine

## 2019-03-04 NOTE — Telephone Encounter (Signed)
Last office visit 12/14/2018 with R. Baity for Sinus headache.  Last refilled 12/08/2018 for #90 with no refills.  CPE scheduled for 10/10/2019.  Ok to refill?

## 2019-05-07 ENCOUNTER — Ambulatory Visit: Payer: BC Managed Care – PPO | Attending: Internal Medicine

## 2019-05-07 DIAGNOSIS — Z23 Encounter for immunization: Secondary | ICD-10-CM

## 2019-05-07 NOTE — Progress Notes (Signed)
   Covid-19 Vaccination Clinic  Name:  Jason [name]    MRN: [id number] DOB: [birth year]  05/07/2019  [name] was observed post Covid-19 immunization for 15 minutes without incident. He was provided with Vaccine Information Sheet and instruction to access the V-Safe system.   [name] was instructed to call 911 with any severe reactions post vaccine: Marland Kitchen Difficulty breathing  . Swelling of face and throat  . A fast heartbeat  . A bad rash all over body  . Dizziness and weakness   Immunizations Administered    Name Date Dose VIS Date Route   Pfizer COVID-19 Vaccine 05/07/2019 10:50 AM 0.3 mL 02/04/2019 Intramuscular   Manufacturer: [place]   Lot: [id number]   [place]: [id number]

## 2019-05-29 ENCOUNTER — Other Ambulatory Visit: Payer: Self-pay | Admitting: Internal Medicine

## 2019-06-01 ENCOUNTER — Ambulatory Visit: Payer: BC Managed Care – PPO | Attending: Internal Medicine

## 2019-06-01 DIAGNOSIS — Z23 Encounter for immunization: Secondary | ICD-10-CM

## 2019-06-01 NOTE — Progress Notes (Signed)
   Covid-19 Vaccination Clinic  Name:  ALEN SUITE    MRN: [id number] DOB: 02-04-1956  06/01/2019  Mr. Kreisman was observed post Covid-19 immunization for 15 minutes without incident. He was provided with Vaccine Information Sheet and instruction to access the V-Safe system.   Mr. Steighner was instructed to call 911 with any severe reactions post vaccine: Marland Kitchen Difficulty breathing  . Swelling of face and throat  . A fast heartbeat  . A bad rash all over body  . Dizziness and weakness   Immunizations Administered    Name Date Dose VIS Date Route   Pfizer COVID-19 Vaccine 06/01/2019  4:16 PM 0.3 mL 02/04/2019 Intramuscular   Manufacturer: [place]   Lot: [id number]   [place]: [id number]

## 2019-06-20 DIAGNOSIS — F411 Generalized anxiety disorder: Secondary | ICD-10-CM | POA: Diagnosis not present

## 2019-06-29 DIAGNOSIS — F411 Generalized anxiety disorder: Secondary | ICD-10-CM | POA: Diagnosis not present

## 2019-07-08 DIAGNOSIS — F411 Generalized anxiety disorder: Secondary | ICD-10-CM | POA: Diagnosis not present

## 2019-07-18 DIAGNOSIS — F411 Generalized anxiety disorder: Secondary | ICD-10-CM | POA: Diagnosis not present

## 2019-07-26 DIAGNOSIS — Z96649 Presence of unspecified artificial hip joint: Secondary | ICD-10-CM | POA: Diagnosis not present

## 2019-08-29 ENCOUNTER — Other Ambulatory Visit: Payer: Self-pay | Admitting: Internal Medicine

## 2019-08-30 NOTE — Telephone Encounter (Signed)
Last filled 05-30-19 #90 Last CPE 10-05-18 Next OV/CPE 10-10-19 [facility] [place]

## 2019-10-09 ENCOUNTER — Other Ambulatory Visit: Payer: Self-pay | Admitting: Internal Medicine

## 2019-10-10 ENCOUNTER — Ambulatory Visit (INDEPENDENT_AMBULATORY_CARE_PROVIDER_SITE_OTHER): Payer: BC Managed Care – PPO | Admitting: Internal Medicine

## 2019-10-10 ENCOUNTER — Encounter: Payer: Self-pay | Admitting: Internal Medicine

## 2019-10-10 ENCOUNTER — Other Ambulatory Visit: Payer: Self-pay

## 2019-10-10 VITALS — BP 142/80 | HR 91 | Temp 97.3°F | Ht 68.25 in | Wt 201.0 lb

## 2019-10-10 DIAGNOSIS — K1370 Unspecified lesions of oral mucosa: Secondary | ICD-10-CM | POA: Insufficient documentation

## 2019-10-10 DIAGNOSIS — Z0000 Encounter for general adult medical examination without abnormal findings: Secondary | ICD-10-CM

## 2019-10-10 DIAGNOSIS — Z125 Encounter for screening for malignant neoplasm of prostate: Secondary | ICD-10-CM

## 2019-10-10 DIAGNOSIS — I10 Essential (primary) hypertension: Secondary | ICD-10-CM | POA: Diagnosis not present

## 2019-10-10 DIAGNOSIS — E039 Hypothyroidism, unspecified: Secondary | ICD-10-CM | POA: Diagnosis not present

## 2019-10-10 NOTE — Addendum Note (Signed)
Addended by: Cloyd Stagers on: 10/10/2019 03:53 PM   Modules accepted: Orders

## 2019-10-10 NOTE — Assessment & Plan Note (Signed)
BP Readings from Last 3 Encounters:  10/10/19 (!) 142/80  12/14/18 139/88  10/05/18 140/86   Generally controlled Will check labs

## 2019-10-10 NOTE — Assessment & Plan Note (Signed)
Seems euthryoid Will check labs

## 2019-10-10 NOTE — Assessment & Plan Note (Signed)
Healthy Flu vaccine in 1-2 months Colon due 2030 Discussed exercise Will check PSA

## 2019-10-10 NOTE — Assessment & Plan Note (Signed)
This is suspicious for melanoma Will set up with ENT urgently for biopsy if appropriate

## 2019-10-10 NOTE — Progress Notes (Signed)
Subjective:    Patient ID: Jason [name], male    DOB: Jan 10, 1956, 64 y.o.   MRN: [id number]  HPI Here for physical This visit occurred during the SARS-CoV-2 public health emergency.  Safety protocols were in place, including screening questions prior to the visit, additional usage of staff PPE, and extensive cleaning of exam room while observing appropriate contact time as indicated for disinfecting solutions.   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 acetaminophen (TYLENOL) 325 MG tablet Take 650 mg by mouth 2 (two) times daily as needed.     . Ascorbic Acid (VITA-C PO) Take 1,000 mg by mouth daily.    . cetirizine (ZYRTEC) 10 MG tablet Take 10 mg by mouth daily.    Marland Kitchen CRANBERRY PO Take 1 tablet by mouth daily.    . cyclobenzaprine (FLEXERIL) 10 MG tablet TAKE 1 TABLET BY MOUTH EVERY DAY AT NIGHT AS NEEDED 90 tablet 0  . levothyroxine (SYNTHROID) 75 MCG tablet TAKE 1 TABLET BY MOUTH EVERY DAY 90 tablet 3  . lisinopril-hydrochlorothiazide (ZESTORETIC) 20-25 MG tablet Take 1 tablet by mouth once daily 90 tablet 3  . loratadine (CLARITIN) 10 MG tablet Take 10 mg by mouth daily. Can take 2 tabs if needed     . Multiple Vitamin (MULTIVITAMIN) tablet Take 1 tablet by mouth daily.      . sildenafil (REVATIO) 20 MG tablet Take 3-5 tablets (60-100 mg total) by mouth daily as needed. 50 tablet 11  . tamsulosin (FLOMAX) 0.4 MG CAPS capsule TAKE 1 CAPSULE BY MOUTH EVERY DAY 90 capsule 3   No current  facility-administered medications on file prior to visit.    No Known Allergies  Past Medical History:  Diagnosis Date  . Allergy   . Arthritis    osteoarthritis  . GERD (gastroesophageal reflux disease)   . Hypertension   . Thyroid disease    hypo    Past Surgical History:  Procedure Laterality Date  . CARDIAC CATHETERIZATION  03-2000  . ROTATOR CUFF REPAIR  10/12   Dr [name] Heck  . TONSILLECTOMY    . 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 TOTAL HIP ARTHROPLASTY Left 06/2016   Dr Harlow Mares    Family History  Problem Relation Age of Onset  . Gout Father   . Arrhythmia Mother   . Hypertension Mother   . Colon cancer Neg Hx   . Esophageal cancer Neg Hx   . Rectal cancer Neg Hx   . Stomach cancer Neg Hx     Social History   Socioeconomic History  . Marital status: Married    Spouse name: Not on file  . Number of children: 0  . Years of education: Not on file  . Highest education level: Not on file  Occupational History  . Occupation: Therapist, occupational: [employer]  Tobacco Use  . Smoking status: Never Smoker  . Smokeless tobacco: Never Used  Vaping Use  . Vaping Use: Never used  Substance and Sexual Activity  . Alcohol  use: Yes    Comment: Occasional wine  . Drug use: No  . Sexual activity: Not on file  Other Topics Concern  . Not on file  Social History Narrative  . Not on file   Social Determinants of Health   Financial Resource Strain:   . Difficulty of Paying Living Expenses:   Food Insecurity:   . Worried About Charity fundraiser in the Last Year:   . Arboriculturist in the Last Year:   Transportation Needs:   . Film/video editor (Medical):   Marland Kitchen Lack of Transportation (Non-Medical):   Physical Activity:   . Days of Exercise per Week:   . Minutes of Exercise per Session:   Stress:   . Feeling of Stress :   Social Connections:   . Frequency of Communication with Friends and Family:   . Frequency of Social Gatherings with  Friends and Family:   . Attends Religious Services:   . Active Member of Clubs or Organizations:   . Attends Archivist Meetings:   Marland Kitchen Marital Status:   Intimate Partner Violence:   . Fear of Current or Ex-Partner:   . Emotionally Abused:   Marland Kitchen Physically Abused:   . Sexually Abused:    Review of Systems  Constitutional: Negative for fatigue and unexpected weight change.       Wears seat belt  HENT: Negative for dental problem, hearing loss, tinnitus and trouble swallowing.        Keeps up with dentist  Eyes: Negative for visual disturbance.       No diplopia or unilateral vision loss  Respiratory: Negative for cough, chest tightness and shortness of breath.   Cardiovascular: Negative for chest pain, palpitations and leg swelling.  Gastrointestinal: Negative for abdominal pain, blood in stool and constipation.       Rare heartburn---famotidine helps  Endocrine: Negative for polydipsia and polyuria.  Genitourinary: Negative for difficulty urinating and urgency.  Musculoskeletal: Positive for back pain. Negative for arthralgias and joint swelling.  Allergic/Immunologic: Positive for environmental allergies. Negative for immunocompromised state.       Clartin/zyrtec help  Neurological: Negative for dizziness, syncope and light-headedness.       Occ sinus headaches  Hematological: Negative for adenopathy. Does not bruise/bleed easily.  Psychiatric/Behavioral: Negative for dysphoric mood and sleep disturbance. The patient is not nervous/anxious.        Objective:   Physical Exam Constitutional:      Appearance: Normal appearance.  HENT:     Head: Normocephalic and atraumatic.     Right Ear: Tympanic membrane, ear canal and external ear normal.     Left Ear: Tympanic membrane, ear canal and external ear normal.     Mouth/Throat:     Comments: Suspicious 75mm dark lesion on left buccal mucosa Eyes:     Conjunctiva/sclera: Conjunctivae normal.     Pupils: Pupils are equal,  round, and reactive to light.  Cardiovascular:     Rate and Rhythm: Normal rate and regular rhythm.     Pulses: Normal pulses.     Heart sounds: Normal heart sounds. No murmur heard.  No gallop.   Pulmonary:     Effort: Pulmonary effort is normal.     Breath sounds: Normal breath sounds. No wheezing or rales.  Abdominal:     Palpations: Abdomen is soft.     Tenderness: There is no abdominal tenderness.  Musculoskeletal:     Cervical back: Neck supple.  Right lower leg: No edema.     Left lower leg: No edema.  Lymphadenopathy:     Cervical: No cervical adenopathy.  Skin:    Comments: 64mm well demarcated nodule in right temple Hyperpigmented area on left nose---nothing palpable Skin tag on right buttock  Neurological:     General: No focal deficit present.     Mental Status: He is alert and oriented to person, place, and time.  Psychiatric:        Mood and Affect: Mood normal.        Behavior: Behavior normal.            Assessment & Plan:

## 2019-10-11 LAB — TSH: TSH: 10.2 u[IU]/mL — ABNORMAL HIGH (ref 0.450–4.500)

## 2019-10-11 LAB — COMPREHENSIVE METABOLIC PANEL
ALT: 42 IU/L (ref 0–44)
AST: 60 IU/L — ABNORMAL HIGH (ref 0–40)
Albumin/Globulin Ratio: 1.5 (ref 1.2–2.2)
Albumin: 4.4 g/dL (ref 3.8–4.8)
Alkaline Phosphatase: 74 IU/L (ref 48–121)
BUN/Creatinine Ratio: 17 (ref 10–24)
BUN: 14 mg/dL (ref 8–27)
Bilirubin Total: 0.7 mg/dL (ref 0.0–1.2)
CO2: 28 mmol/L (ref 20–29)
Calcium: 9.6 mg/dL (ref 8.6–10.2)
Chloride: 98 mmol/L (ref 96–106)
Creatinine, Ser: 0.81 mg/dL (ref 0.76–1.27)
GFR calc Af Amer: 111 mL/min/{1.73_m2} (ref >59)
GFR calc non Af Amer: 96 mL/min/{1.73_m2} (ref >59)
Globulin, Total: 2.9 g/dL (ref 1.5–4.5)
Glucose: 87 mg/dL (ref 65–99)
Potassium: 3.9 mmol/L (ref 3.5–5.2)
Sodium: 141 mmol/L (ref 134–144)
Total Protein: 7.3 g/dL (ref 6.0–8.5)

## 2019-10-11 LAB — CBC
Hematocrit: 44.2 % (ref 37.5–51.0)
Hemoglobin: 15.2 g/dL (ref 13.0–17.7)
MCH: 32.1 pg (ref 26.6–33.0)
MCHC: 34.4 g/dL (ref 31.5–35.7)
MCV: 93 fL (ref 79–97)
Platelets: 284 10*3/uL (ref 150–450)
RBC: 4.74 x10E6/uL (ref 4.14–5.80)
RDW: 12.5 % (ref 11.6–15.4)
WBC: 8.5 10*3/uL (ref 3.4–10.8)

## 2019-10-11 LAB — PSA: Prostate Specific Ag, Serum: 2.6 ng/mL (ref 0.0–4.0)

## 2019-10-11 LAB — T4, FREE: Free T4: 0.88 ng/dL (ref 0.82–1.77)

## 2019-10-14 MED ORDER — LEVOTHYROXINE SODIUM 88 MCG PO TABS
88.0000 ug | ORAL_TABLET | Freq: Every day | ORAL | 3 refills | Status: DC
Start: 2019-10-14 — End: 2020-08-09

## 2019-11-26 ENCOUNTER — Other Ambulatory Visit: Payer: Self-pay | Admitting: Internal Medicine

## 2020-01-31 ENCOUNTER — Emergency Department
Admission: EM | Admit: 2020-01-31 | Discharge: 2020-02-01 | Disposition: A | Discharge status: Home / Self Care | Payer: BC Managed Care – PPO | Attending: Emergency Medicine | Admitting: Emergency Medicine

## 2020-01-31 ENCOUNTER — Other Ambulatory Visit: Payer: Self-pay

## 2020-01-31 ENCOUNTER — Emergency Department: Payer: BC Managed Care – PPO

## 2020-01-31 DIAGNOSIS — E876 Hypokalemia: Secondary | ICD-10-CM | POA: Diagnosis not present

## 2020-01-31 DIAGNOSIS — R200 Anesthesia of skin: Secondary | ICD-10-CM | POA: Diagnosis not present

## 2020-01-31 DIAGNOSIS — Z79899 Other long term (current) drug therapy: Secondary | ICD-10-CM | POA: Insufficient documentation

## 2020-01-31 DIAGNOSIS — I10 Essential (primary) hypertension: Secondary | ICD-10-CM | POA: Insufficient documentation

## 2020-01-31 DIAGNOSIS — R079 Chest pain, unspecified: Secondary | ICD-10-CM | POA: Diagnosis not present

## 2020-01-31 DIAGNOSIS — F410 Panic disorder [episodic paroxysmal anxiety] without agoraphobia: Secondary | ICD-10-CM | POA: Diagnosis not present

## 2020-01-31 DIAGNOSIS — Z96643 Presence of artificial hip joint, bilateral: Secondary | ICD-10-CM | POA: Diagnosis not present

## 2020-01-31 DIAGNOSIS — R0602 Shortness of breath: Secondary | ICD-10-CM | POA: Diagnosis not present

## 2020-01-31 DIAGNOSIS — E039 Hypothyroidism, unspecified: Secondary | ICD-10-CM | POA: Insufficient documentation

## 2020-01-31 LAB — TROPONIN I (HIGH SENSITIVITY)
Troponin I (High Sensitivity): 26 ng/L — ABNORMAL HIGH (ref <18)
Troponin I (High Sensitivity): 24 ng/L — ABNORMAL HIGH (ref <18)

## 2020-01-31 LAB — CBC
HCT: 43.9 % (ref 39.0–52.0)
Hemoglobin: 15.3 g/dL (ref 13.0–17.0)
MCH: 32.3 pg (ref 26.0–34.0)
MCHC: 34.9 g/dL (ref 30.0–36.0)
MCV: 92.8 fL (ref 80.0–100.0)
Platelets: 265 10*3/uL (ref 150–400)
RBC: 4.73 MIL/uL (ref 4.22–5.81)
RDW: 13.1 % (ref 11.5–15.5)
WBC: 9.6 10*3/uL (ref 4.0–10.5)
nRBC: 0 % (ref 0.0–0.2)

## 2020-01-31 LAB — BASIC METABOLIC PANEL
Anion gap: 11 (ref 5–15)
BUN: 11 mg/dL (ref 8–23)
CO2: 26 mmol/L (ref 22–32)
Calcium: 9.2 mg/dL (ref 8.9–10.3)
Chloride: 99 mmol/L (ref 98–111)
Creatinine, Ser: 0.81 mg/dL (ref 0.61–1.24)
GFR, Estimated: >60 mL/min (ref >60)
Glucose, Bld: 104 mg/dL — ABNORMAL HIGH (ref 70–99)
Potassium: 2.9 mmol/L — ABNORMAL LOW (ref 3.5–5.1)
Sodium: 136 mmol/L (ref 135–145)

## 2020-01-31 NOTE — ED Triage Notes (Signed)
Pt comes POV with possible anxiety. Has SOB, and shaking. Started this afternoon after working out.

## 2020-02-01 ENCOUNTER — Other Ambulatory Visit: Payer: Self-pay

## 2020-02-01 DIAGNOSIS — F410 Panic disorder [episodic paroxysmal anxiety] without agoraphobia: Secondary | ICD-10-CM | POA: Diagnosis not present

## 2020-02-01 LAB — TROPONIN I (HIGH SENSITIVITY): Troponin I (High Sensitivity): 21 ng/L — ABNORMAL HIGH (ref <18)

## 2020-02-01 MED ORDER — POTASSIUM CHLORIDE CRYS ER 20 MEQ PO TBCR
40.0000 meq | EXTENDED_RELEASE_TABLET | Freq: Once | ORAL | Status: AC
  Administered 2020-02-01: 40 meq via ORAL
  Filled 2020-02-01: qty 2

## 2020-02-01 NOTE — ED Provider Notes (Signed)
[facility] Emergency Department Provider Note  ____________________________________________  Time seen: Approximately 1:12 AM  I have reviewed the triage vital signs and the nursing notes.   HISTORY  Chief Complaint Shortness of Breath and Shaking   HPI Jason SEDIVY is a 64 y.o. male history of hypothyroidism, hypertension, GERD who presents for evaluation of shortness of breath.  Patient reports being in his usual state of health until this afternoon when he went to the gym with his wife.  On his way back home they were talking about some stuff that has been upsetting patient at work.  Patient started feeling unwell, started hyperventilating.  Reports that his hands went numb but his heart started pounding in his chest.  They turned around and drove to the hospital.  Soon as patient was brought into the emergency room the episode resolved.  Patient denies any history of panic attacks but reports that he was upset about the conversation.  He denies any chest pain, lightheadedness, syncope.  He has been in the waiting room for almost 7 hours with no recurrence of the symptoms.  Denies any personal or family history of heart attacks, PE or DVT, recent travel immobilization, leg pain or swelling, hemoptysis, exogenous hormones.  Patient reports that he did his aerobic exercises at the gym before this episode and was feeling fine.  His wife is at bedside and corroborates the story and reports that he felt like he was having a panic attack.   Past Medical History:  Diagnosis Date  . Allergy   . Arthritis    osteoarthritis  . GERD (gastroesophageal reflux disease)   . Hypertension   . Thyroid disease    hypo    Patient Active Problem List   Diagnosis Date Noted  . Oral lesion 10/10/2019  . BPH without obstruction/lower urinary tract symptoms 11/07/2013  . Routine general medical examination at a health care facility 06/24/2010  . Hypothyroidism 06/19/2009   . OSTEOARTHRITIS 12/19/2008  . BACK PAIN, LUMBAR 02/26/2007  . Essential hypertension, benign 09/17/2006  . ALLERGIC RHINITIS 09/17/2006  . GERD 09/17/2006    Past Surgical History:  Procedure Laterality Date  . CARDIAC CATHETERIZATION  03-2000  . ROTATOR CUFF REPAIR  10/12   Dr [name] Heck  . TONSILLECTOMY    . 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 TOTAL HIP ARTHROPLASTY Left 06/2016   Dr Harlow Mares    Prior to Admission medications   Medication Sig Start Date End Date Taking? Authorizing Provider  acetaminophen (TYLENOL) 325 MG tablet Take 650 mg by mouth 2 (two) times daily as needed.     [provider]  Ascorbic Acid (VITA-C PO) Take 1,000 mg by mouth daily.    [provider]  cetirizine (ZYRTEC) 10 MG tablet Take 10 mg by mouth daily.    [provider]  CRANBERRY PO Take 1 tablet by mouth daily.    [provider]  cyclobenzaprine (FLEXERIL) 10 MG tablet TAKE 1 TABLET BY MOUTH EVERY DAY AT NIGHT AS NEEDED 11/27/19   Venia Carbon, MD  levothyroxine (SYNTHROID) 88 MCG tablet Take 1 tablet (88 mcg total) by mouth daily. 10/14/19   Venia Carbon, MD  lisinopril-hydrochlorothiazide (ZESTORETIC) 20-25 MG tablet Take 1 tablet by mouth once daily 10/10/19   Venia Carbon, MD  loratadine (CLARITIN) 10 MG tablet Take 10 mg by mouth daily. Can take 2 tabs if needed     [provider]  Multiple Vitamin (MULTIVITAMIN) tablet  Take 1 tablet by mouth daily.      [provider]  sildenafil (REVATIO) 20 MG tablet Take 3-5 tablets (60-100 mg total) by mouth daily as needed. 10/05/18   Venia Carbon, MD  tamsulosin (FLOMAX) 0.4 MG CAPS capsule TAKE 1 CAPSULE BY MOUTH EVERY DAY 11/26/19   Venia Carbon, MD    Allergies Patient has no known allergies.  Family History  Problem Relation Age of Onset  . Gout Father   . Arrhythmia Mother   . Hypertension Mother   . Colon cancer Neg Hx   . Esophageal cancer Neg Hx   . Rectal  cancer Neg Hx   . Stomach cancer Neg Hx     Social History Social History   Tobacco Use  . Smoking status: Never Smoker  . Smokeless tobacco: Never Used  Vaping Use  . Vaping Use: Never used  Substance Use Topics  . Alcohol use: Yes    Comment: Occasional wine  . Drug use: No    Review of Systems  Constitutional: Negative for fever. Eyes: Negative for visual changes. ENT: Negative for sore throat. Neck: No neck pain  Cardiovascular: Negative for chest pain. + palpitations Respiratory: + shortness of breath. Gastrointestinal: Negative for abdominal pain, vomiting or diarrhea. Genitourinary: Negative for dysuria. Musculoskeletal: Negative for back pain. Skin: Negative for rash. Neurological: Negative for headaches, weakness or numbness. Psych: No SI or HI  ____________________________________________   PHYSICAL EXAM:  VITAL SIGNS: ED Triage Vitals  Enc Vitals Group     BP 01/31/20 1835 (!) 173/92     Pulse Rate 01/31/20 1835 94     Resp 01/31/20 1835 18     Temp 01/31/20 1838 97.6 F (36.4 C)     Temp Source 01/31/20 1838 Oral     SpO2 01/31/20 1835 99 %     Weight 01/31/20 1836 205 lb (93 kg)     Height 01/31/20 1836 5\' 8"  (1.727 m)     Head Circumference --      Peak Flow --      Pain Score 01/31/20 1836 0     Pain Loc --      Pain Edu? --      Excl. in [place]? --     Constitutional: Alert and oriented. Well appearing and in no apparent distress. HEENT:      Head: Normocephalic and atraumatic.         Eyes: Conjunctivae are normal. Sclera is non-icteric.       Mouth/Throat: Mucous membranes are moist.       Neck: Supple with no signs of meningismus. Cardiovascular: Regular rate and rhythm. No murmurs, gallops, or rubs. 2+ symmetrical distal pulses are present in all extremities.  Respiratory: Normal respiratory effort. Lungs are clear to auscultation bilaterally.  Gastrointestinal: Soft, non tender, and non distended. Musculoskeletal:  No edema,  cyanosis, or erythema of extremities. Neurologic: Normal speech and language. Face is symmetric. Moving all extremities. No gross focal neurologic deficits are appreciated. Skin: Skin is warm, dry and intact. No rash noted. Psychiatric: Mood and affect are normal. Speech and behavior are normal.  ____________________________________________   LABS (all labs ordered are listed, but only abnormal results are displayed)  Labs Reviewed  BASIC METABOLIC PANEL - Abnormal; Notable for the following components:      Result Value   Potassium 2.9 (*)    Glucose, Bld 104 (*)    All other components within normal limits  TROPONIN I (HIGH SENSITIVITY) -  Abnormal; Notable for the following components:   Troponin I (High Sensitivity) 26 (*)    All other components within normal limits  TROPONIN I (HIGH SENSITIVITY) - Abnormal; Notable for the following components:   Troponin I (High Sensitivity) 24 (*)    All other components within normal limits  TROPONIN I (HIGH SENSITIVITY) - Abnormal; Notable for the following components:   Troponin I (High Sensitivity) 21 (*)    All other components within normal limits  CBC   ____________________________________________  EKG  ED ECG REPORT I, Rudene Re, the attending physician, personally viewed and interpreted this ECG.  18:29 -normal sinus rhythm with PVCs, rate of 93, normal intervals, normal axis, no ST elevations or depressions. Unchanged from prior from 2018  00:54 -normal sinus rhythm with occasional PVCs, normal intervals, normal axis, no ST elevations or depressions.  Unchanged from prior ____________________________________________  RADIOLOGY  I have personally reviewed the images performed during this visit and I agree with the Radiologist's read.   Interpretation by Radiologist:  DG Chest 2 View  Result Date: 01/31/2020 CLINICAL DATA:  Chest pain EXAM: CHEST - 2 VIEW COMPARISON:  None. FINDINGS: The heart size and mediastinal  contours are within normal limits. Calcified granuloma seen at the right lung base pleural. Both lungs are otherwise clear. The visualized skeletal structures are unremarkable. IMPRESSION: No active cardiopulmonary disease. Electronically Signed   By: Prudencio Pair M.D.   On: 01/31/2020 19:12     ____________________________________________   PROCEDURES  Procedure(s) performed:yes .1-3 Lead EKG Interpretation Performed by: Rudene Re, MD Authorized by: Rudene Re, MD     Interpretation: non-specific     ECG rate assessment: normal     Rhythm: sinus rhythm     Ectopy: PVCs     Critical Care performed:  None ____________________________________________   INITIAL IMPRESSION / ASSESSMENT AND PLAN / ED COURSE  64 y.o. male history of hypothyroidism, hypertension, GERD who presents for evaluation of shortness of breath.  Per patient and wife's description of the episode it sounds like patient might have had a panic attack.  He has been asymptomatic for 7 hours now.  He has had 2 EKGs with no signs of acute ischemia but did show PVCs in the setting of mild hypokalemia with a K of 2.9 which was supplemented p.o.  Patient is on hydrochlorothiazide but has had recent labs done by his primary care doctor which were reviewed by me in August 2021 showing normal potassium.  He was already on hydrochlorothiazide at that time.  Therefore recommended increasing p.o. intake of foods rich in potassium but no supplementation at this time until he follows up with his primary care doctor for repeat blood work.  He was monitored on telemetry for 1.5 hours with no signs of dysrhythmias other than PVCs.  Initial high-sensitivity troponin of 26 which trended down to 24 and then 21.  Most likely since patient had just left the gym may be transient elevation in the troponins from exercise.  He never had any chest pain.  Chest x-ray visualized by me with no evidence of edema, pneumonia, pneumothorax,  infection, confirmed by radiology.  At this time with symptoms resolved for over 7 hours and pretty unremarkable evaluation will recommend discharge home with follow-up with primary care doctor.  Discussed my standard return precautions with him and his wife was at bedside.       _____________________________________________ Please note:  Patient was evaluated in Emergency Department today for the symptoms described in the  history of present illness. Patient was evaluated in the context of the global COVID-19 pandemic, which necessitated consideration that the patient might be at risk for infection with the SARS-CoV-2 virus that causes COVID-19. Institutional protocols and algorithms that pertain to the evaluation of patients at risk for COVID-19 are in a state of rapid change based on information released by regulatory bodies including the CDC and federal and state organizations. These policies and algorithms were followed during the patient's care in the ED.  Some ED evaluations and interventions may be delayed as a result of limited staffing during the pandemic.   [place] Controlled Substance Database was reviewed by me. ____________________________________________   FINAL CLINICAL IMPRESSION(S) / ED DIAGNOSES   Final diagnoses:  Panic attack  Hypokalemia      NEW MEDICATIONS STARTED DURING THIS VISIT:  ED Discharge Orders    None       Note:  This document was prepared using Dragon voice recognition software and may include unintentional dictation errors.    Alfred Levins, [place], MD 02/01/20 [phone]

## 2020-02-01 NOTE — ED Notes (Signed)
Pt presents to ED with c/o of having an "episode" earlier this evening where he felt like his heart was racing, he became tachypnic, and then started having numbness in hands. Pt states that he felt very anxious before this episode occurred. Pt denied any past hx of anything like this. Pt denies CP. Pt on monitor and has no complaints at this time. VSS. Awaiting further orders. Will continue to monitor.

## 2020-02-01 NOTE — Telephone Encounter (Signed)
Probably better to schedule visit as well I can do the potassium check then

## 2020-02-03 ENCOUNTER — Ambulatory Visit: Payer: BC Managed Care – PPO | Admitting: Internal Medicine

## 2020-02-20 ENCOUNTER — Telehealth: Payer: Self-pay

## 2020-02-20 NOTE — Telephone Encounter (Signed)
I sent a MyChart message.

## 2020-02-20 NOTE — Telephone Encounter (Signed)
Pt left v/m requesting cb that pt has covid question. I called pt x 2 and phone remains busy; will try later and sending to [facility] CMA.

## 2020-02-22 ENCOUNTER — Other Ambulatory Visit: Payer: Self-pay | Admitting: Internal Medicine

## 2020-02-25 DIAGNOSIS — R002 Palpitations: Secondary | ICD-10-CM

## 2020-02-25 HISTORY — DX: Palpitations: R00.2

## 2020-03-13 ENCOUNTER — Ambulatory Visit: Payer: BC Managed Care – PPO | Admitting: Internal Medicine

## 2020-03-14 ENCOUNTER — Ambulatory Visit (INDEPENDENT_AMBULATORY_CARE_PROVIDER_SITE_OTHER): Payer: BC Managed Care – PPO | Admitting: Family Medicine

## 2020-03-14 ENCOUNTER — Other Ambulatory Visit: Payer: Self-pay

## 2020-03-14 VITALS — BP 138/98 | HR 47 | Temp 98.0°F | Ht 68.25 in | Wt 208.2 lb

## 2020-03-14 DIAGNOSIS — I493 Ventricular premature depolarization: Secondary | ICD-10-CM

## 2020-03-14 DIAGNOSIS — E876 Hypokalemia: Secondary | ICD-10-CM | POA: Diagnosis not present

## 2020-03-14 DIAGNOSIS — R002 Palpitations: Secondary | ICD-10-CM | POA: Insufficient documentation

## 2020-03-14 DIAGNOSIS — E039 Hypothyroidism, unspecified: Secondary | ICD-10-CM | POA: Diagnosis not present

## 2020-03-14 DIAGNOSIS — I10 Essential (primary) hypertension: Secondary | ICD-10-CM

## 2020-03-14 NOTE — Progress Notes (Signed)
Subjective:     Jason [name] is a 65 y.o. male presenting for Panic Attack (2 in the past; one in December and one last evening, both after going to the gym)     HPI  #Panic Attack - went to the ER on 01/31/2020 - had hypokalemia and PVCs on his EKGs there - he was replenished with improvement - 2 episodes - both occurring after going to the gym - the gym is in the basement of the hospital - on 12/7 - left the gym and went to get gas and started not feeling well, pulled over and went to the hospital  - went to the ER - numbness, rapid heart beat, fatigued, had done a cardio work-out prior the episode  Episode 2: 03/14/2019 - did 15 minutes cardio and 15 minutes weight training  After he finished working out was resting and drinking water -- and got into the car and was driving home  -- fast heartbeat -- tingling in the hands -- shortness of breath - he had to focus on slowing down breathing -- no chest pain -- improved with deep breathing, lasted 15 minutes total - occurred while driving -- wife gave him a paper bag to breathe into and eventual  Overall stress level is about the same it has always been   hypothyroidism - dose was increased in august  - did not return for labs  Review of Systems   Social History   Tobacco Use  Smoking Status Never Smoker  Smokeless Tobacco Never Used        Objective:    BP Readings from Last 3 Encounters:  03/14/20 (!) 138/98  02/01/20 (!) 158/102  10/10/19 (!) 142/80   Wt Readings from Last 3 Encounters:  03/14/20 208 lb 4 oz (94.5 kg)  01/31/20 205 lb (93 kg)  10/10/19 201 lb (91.2 kg)    BP (!) 138/98    Pulse (!) 47    Temp 98 F (36.7 C) (Temporal)    Ht 5' 8.25" (1.734 m)    Wt 208 lb 4 oz (94.5 kg)    SpO2 100%    BMI 31.43 kg/m    Physical Exam Constitutional:      Appearance: Normal appearance. He is not ill-appearing or diaphoretic.  HENT:     Right Ear: External ear normal.     Left Ear: External  ear normal.  Eyes:     General: No scleral icterus.    Extraocular Movements: Extraocular movements intact.     Conjunctiva/sclera: Conjunctivae normal.  Cardiovascular:     Rate and Rhythm: Normal rate and regular rhythm.     Heart sounds: No murmur heard.   Pulmonary:     Effort: Pulmonary effort is normal. No respiratory distress.     Breath sounds: Normal breath sounds. No wheezing.  Musculoskeletal:     Cervical back: Neck supple.  Skin:    General: Skin is warm and dry.  Neurological:     Mental Status: He is alert. Mental status is at baseline.  Psychiatric:        Mood and Affect: Mood normal.           Assessment & Plan:   Problem List Items Addressed This Visit      Cardiovascular and Mediastinum   Essential hypertension, benign    BP elevated today and at the ER prior. Cont lisinopril-HCTZ 20-25 mg and f/u with PCP next month.  Relevant Orders   Basic metabolic panel     Endocrine   Hypothyroidism - Primary    Last TSH was >10 and pt notes dose increase. Has not returned for repeat labs. Cont levothyroxine 88 mcg and recheck TSH today.       Relevant Orders   TSH     Other   Hypokalemia   Relevant Orders   Basic metabolic panel   Palpitations    Pt with 2 episodes of palpitations and SOB. Hx of stent placement but denies any CP. Evaluated in the ER with EKG with PVCs as well as slightly elevated and downtrending troponin. ER precautions if CP or symptoms do not resolve with rest, but at this time does not seem consistent with panic attack given no changes in overall stress level though GAD slightly elevated. Will recheck Potassium and TSH to rule out thyroid but will also refer to cardiology for further evaluation and work-up.       Relevant Orders   Ambulatory referral to Cardiology   Basic metabolic panel    Other Visit Diagnoses    PVC (premature ventricular contraction)       Relevant Orders   Ambulatory referral to Cardiology    Basic metabolic panel       Return if symptoms worsen or fail to improve.  [name], MD  This visit occurred during the SARS-CoV-2 public health emergency.  Safety protocols were in place, including screening questions prior to the visit, additional usage of staff PPE, and extensive cleaning of exam room while observing appropriate contact time as indicated for disinfecting solutions.

## 2020-03-14 NOTE — Assessment & Plan Note (Signed)
BP elevated today and at the ER prior. Cont lisinopril-HCTZ 20-25 mg and f/u with PCP next month.

## 2020-03-14 NOTE — Patient Instructions (Signed)
Blood work today  #Referral I have placed a referral to a specialist for you. You should receive a phone call from the specialty office. Make sure your voicemail is not full and that if you are able to answer your phone to unknown or new numbers.   It may take up to 2 weeks to hear about the referral. If you do not hear anything in 2 weeks, please call our office and ask to speak with the referral coordinator.    If your symptoms return and do not improve with rest and deep breathing or you have chest pain -- go to the ER

## 2020-03-14 NOTE — Assessment & Plan Note (Signed)
Pt with 2 episodes of palpitations and SOB. Hx of stent placement but denies any CP. Evaluated in the ER with EKG with PVCs as well as slightly elevated and downtrending troponin. ER precautions if CP or symptoms do not resolve with rest, but at this time does not seem consistent with panic attack given no changes in overall stress level though GAD slightly elevated. Will recheck Potassium and TSH to rule out thyroid but will also refer to cardiology for further evaluation and work-up.

## 2020-03-14 NOTE — Assessment & Plan Note (Signed)
Last TSH was >10 and pt notes dose increase. Has not returned for repeat labs. Cont levothyroxine 88 mcg and recheck TSH today.

## 2020-03-15 ENCOUNTER — Ambulatory Visit (INDEPENDENT_AMBULATORY_CARE_PROVIDER_SITE_OTHER): Payer: BC Managed Care – PPO | Admitting: Internal Medicine

## 2020-03-15 ENCOUNTER — Telehealth: Payer: Self-pay | Admitting: *Deleted

## 2020-03-15 ENCOUNTER — Encounter: Payer: Self-pay | Admitting: Internal Medicine

## 2020-03-15 VITALS — BP 152/98 | HR 90 | Ht 68.25 in | Wt 212.0 lb

## 2020-03-15 DIAGNOSIS — I200 Unstable angina: Secondary | ICD-10-CM | POA: Insufficient documentation

## 2020-03-15 DIAGNOSIS — Z01810 Encounter for preprocedural cardiovascular examination: Secondary | ICD-10-CM

## 2020-03-15 DIAGNOSIS — I493 Ventricular premature depolarization: Secondary | ICD-10-CM | POA: Diagnosis not present

## 2020-03-15 DIAGNOSIS — R002 Palpitations: Secondary | ICD-10-CM

## 2020-03-15 DIAGNOSIS — I10 Essential (primary) hypertension: Secondary | ICD-10-CM

## 2020-03-15 LAB — BASIC METABOLIC PANEL
BUN/Creatinine Ratio: 13 (ref 10–24)
BUN: 11 mg/dL (ref 8–27)
CO2: 28 mmol/L (ref 20–29)
Calcium: 9.4 mg/dL (ref 8.6–10.2)
Chloride: 98 mmol/L (ref 96–106)
Creatinine, Ser: 0.88 mg/dL (ref 0.76–1.27)
GFR calc Af Amer: 106 mL/min/{1.73_m2} (ref >59)
GFR calc non Af Amer: 92 mL/min/{1.73_m2} (ref >59)
Glucose: 80 mg/dL (ref 65–99)
Potassium: 4.2 mmol/L (ref 3.5–5.2)
Sodium: 139 mmol/L (ref 134–144)

## 2020-03-15 LAB — TSH: TSH: 5.4 u[IU]/mL — ABNORMAL HIGH (ref 0.450–4.500)

## 2020-03-15 MED ORDER — ASPIRIN EC 81 MG PO TBEC: 81.0000 mg | DELAYED_RELEASE_TABLET | Freq: Every day | ORAL | 3 refills | Status: DC

## 2020-03-15 MED ORDER — METOPROLOL SUCCINATE ER 25 MG PO TB24: 25.0000 mg | ORAL_TABLET | Freq: Every day | ORAL | 2 refills | Status: DC

## 2020-03-15 NOTE — Telephone Encounter (Signed)
Patient seen in clinic today. Needs add on CBC to labs from yesterday if possible for preprocedural labs for left heart cath on 03/20/20. I will need to call [facility] tomorrow to see if this can be added on.

## 2020-03-15 NOTE — Progress Notes (Signed)
New Outpatient Visit Date: 03/15/2020  Referring Provider: [name], MD [address] [place],  [place] [address]  Chief Complaint: Palpitations  HPI:  Mr. Bellar is a 65 y.o. male who is being seen today for the evaluation of palpitations at the request of Dr. Einar Pheasant. He has a history of hypertension, thyroid disease, GERD, and osteoarthritis. Mr. Michonski states that he was doing well until 01/31/20.  He was driving home with his wife after having exercised at the [facility] gym.  He suddenly developed an unwell feeling in his chest.  He states that it was not painful but seemed more like a flutter.  It was accompanied by numbness in both hands.  He went to the [facility] ED and was ultimately dx'ed with a panic attack, though PVC's were observed.  HS-TnI was mildly elevated but flat.  In hindsight, he also felt somewhat unwell while doing cardio at the gym earlier that day.  He had been feeling well until 2 days ago, when he experienced a similar episode after having exercised.  The palpitations and unwell sensation resolved with deep breathing after a coule of minutes.  He has felt well since then.  Mr. Varland denies shortness of breath, edema, and orthopnea.  He has occasional lightheadedness, which is not associated with the aforementioned palpitations or "unwell" feeling.  He reports an episode of chest pain in 2002.  Subsequent stress test was reportedly abnormal and led to catheterization by Dr. Velora Heckler that showed no significant CAD. Marland Kitchen --------------------------------------------------------------------------------------------------  Cardiovascular History & Procedures: Cardiovascular Problems:  Chest discomfort and palpitations  Risk Factors:  Hypertension, male gender, obesity, and age > 13  Cath/PCI:  LHC (04/23/2000): Normal coronary arteries.  Normal LVEF.  Tortuous iliac arteries.  CV Surgery:  None  EP Procedures and Devices:  None  Non-Invasive Evaluation(s):  None  available  Recent CV Pertinent Labs: Lab Results  Component Value Date   CHOL 157 10/07/2016   CHOL 152 08/18/2014   HDL 48 10/07/2016   HDL 54 08/18/2014   LDLCALC 98 10/07/2016   LDLCALC 88 08/18/2014   TRIG 55 10/07/2016   CHOLHDL 2.8 08/18/2014   K 4.2 03/14/2020   BUN 11 03/14/2020   CREATININE 0.88 03/14/2020    --------------------------------------------------------------------------------------------------  Past Medical History:  Diagnosis Date  . Allergy   . Arthritis    osteoarthritis  . GERD (gastroesophageal reflux disease)   . Hypertension   . Thyroid disease    Hyperthyroidism s/p radioactive iodine ablation    Past Surgical History:  Procedure Laterality Date  . CARDIAC CATHETERIZATION  03-2000  . ROTATOR CUFF REPAIR  10/12   Dr [name] Heck  . TONSILLECTOMY    . 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 TOTAL HIP ARTHROPLASTY Left 06/2016   Dr Harlow Mares    Current Meds  Medication Sig  . acetaminophen (TYLENOL) 325 MG tablet Take 650 mg by mouth 2 (two) times daily as needed.  . Ascorbic Acid (VITA-C PO) Take 1,000 mg by mouth daily.  . cetirizine (ZYRTEC) 10 MG tablet Take 10 mg by mouth daily.  Marland Kitchen CRANBERRY PO Take 1 tablet by mouth daily.  . cyclobenzaprine (FLEXERIL) 10 MG tablet TAKE 1 TABLET BY MOUTH EVERY DAY AT NIGHT AS NEEDED  . levothyroxine (SYNTHROID) 88 MCG tablet Take 1 tablet (88 mcg total) by mouth daily.  Marland Kitchen lisinopril-hydrochlorothiazide (ZESTORETIC) 20-25 MG tablet Take 1 tablet by mouth once daily  . loratadine (CLARITIN) 10 MG tablet Take 10 mg  by mouth daily. Can take 2 tabs if needed  . Multiple Vitamin (MULTIVITAMIN) tablet Take 1 tablet by mouth daily.  . sildenafil (REVATIO) 20 MG tablet Take 3-5 tablets (60-100 mg total) by mouth daily as needed.  . tamsulosin (FLOMAX) 0.4 MG CAPS capsule TAKE 1 CAPSULE BY MOUTH EVERY DAY    Allergies: Patient has no known allergies.  Social History   Tobacco Use  . Smoking status: Never  Smoker  . Smokeless tobacco: Never Used  Vaping Use  . Vaping Use: Never used  Substance Use Topics  . Alcohol use: Yes    Comment: Alcohol once every few weeks/months  . Drug use: No    Family History  Problem Relation Age of Onset  . Gout Father   . Heart attack Father   . Arrhythmia Mother   . Hypertension Mother   . Colon cancer Neg Hx   . Esophageal cancer Neg Hx   . Rectal cancer Neg Hx   . Stomach cancer Neg Hx     Review of Systems: A 12-system review of systems was performed and was negative except as noted in the HPI.  --------------------------------------------------------------------------------------------------  Physical Exam: BP (!) 152/98 (BP Location: Right Arm, Patient Position: Sitting, Cuff Size: Normal)   Pulse 90   Ht 5' 8.25" (1.734 m)   Wt 212 lb (96.2 kg)   SpO2 96%   BMI 32.00 kg/m   General:  NAD. HEENT: No conjunctival pallor or scleral icterus. Facemask in place. Neck: Supple without lymphadenopathy, thyromegaly, JVD, or HJR. No carotid bruit. Lungs: Normal work of breathing. Clear to auscultation bilaterally without wheezes or crackles. Heart: Regular rate and rhythm without murmurs, rubs, or gallops. Non-displaced PMI. Abd: Bowel sounds present. Soft, NT/ND without hepatosplenomegaly Ext: No lower extremity edema. Radial, PT, and DP pulses are 2+ bilaterally Skin: Warm and dry without rash. Neuro: CNIII-XII intact. Strength and fine-touch sensation intact in upper and lower extremities bilaterally. Psych: Normal mood and affect.  EKG:  Normal sinus rhythm with lateral ST/T changes, mor pronounced than prior tracing on 02/01/2020.  PVC's are no longer present.  Lab Results  Component Value Date   WBC 9.6 01/31/2020   HGB 15.3 01/31/2020   HCT 43.9 01/31/2020   MCV 92.8 01/31/2020   PLT 265 01/31/2020    Lab Results  Component Value Date   NA 139 03/14/2020   K 4.2 03/14/2020   CL 98 03/14/2020   CO2 28 03/14/2020   BUN 11  03/14/2020   CREATININE 0.88 03/14/2020   GLUCOSE 80 03/14/2020   ALT 42 10/10/2019    Lab Results  Component Value Date   CHOL 157 10/07/2016   HDL 48 10/07/2016   LDLCALC 98 10/07/2016   TRIG 55 10/07/2016   CHOLHDL 2.8 08/18/2014     --------------------------------------------------------------------------------------------------  ASSESSMENT AND PLAN: Unstable angina: Though symptoms consisting primarily of "unwell" chest sensation, palpitations, and hand numbness are atypical, I am concerned that recent episodes may reflect coronary insufficiency, given association with exercise, as well as ST/T changes on EKG today, frequent PVC's noted during recent ED visit, as well as mild (albeit flat) troponin elevation during ED visit last month.  As Mr. Rinck is currently asymptomatic, I think it is reasonable to proceed with outpatient catheterization on an expedited basis.  Mr. Kurtzman is in agreement with this.  In the meantime, we will start aspirin 81 mg daily and metoprolol succinate 25 mg daily.  I will defer adding NTG given intermittent sildenafil  use pending catheterization.  I advised Mr. Deneault to call 911 or seek immediate medical attention if he has recurrent symptoms.  Shared Decision Making/Informed Consent The risks [stroke (1 in 1000), death (1 in 1000), kidney failure [usually temporary] (1 in 500), bleeding (1 in 200), allergic reaction [possibly serious] (1 in 200)], benefits (diagnostic support and management of coronary artery disease) and alternatives of a cardiac catheterization were discussed in detail with Mr. Schippers and he is willing to proceed.  Hypertension: Blood pressure mildly elevated today.  We will start metoprolol succinate 25 mg daily; continue current dose of lisinopril-HCTZ.  PVC's and palpitations: We will add metoprolol, as above.  Consider ambulatory cardiac monitoring if symptoms persist and catheterization is unrevealing.  Follow-up: Return  to clinic in 3 weels.  Nelva Bush, MD 03/15/2020 4:20 PM

## 2020-03-15 NOTE — Patient Instructions (Signed)
Medication Instructions:  Your physician has recommended you make the following change in your medication:  1- START Aspirin 81 mg by mouth once a day. 2- START Toprol XL 25 mg by mouth once a day.  *If you need a refill on your cardiac medications before your next appointment, please call your pharmacy*   Lab Work:  You had lab work yesterday. I will call [facility] and see if they can add on a CBC. If not you may need to get lab work tomorrow.  COVID PRE- TEST: You will need a COVID TEST prior to the procedure:  LOCATION: [facility] site.  DATE/TIME:  Friday, March 16, 2020 (anytime between 8 am and 1 pm)  If you have labs (blood work) drawn today and your tests are completely normal, you will receive your results only by: Marland Kitchen MyChart Message (if you have MyChart) OR . A paper copy in the mail If you have any lab test that is abnormal or we need to change your treatment, we will call you to review the results.   Testing/Procedures:  You are scheduled for a LEFT Cardiac Catheterization on Tuesday, January 25 with Dr. Harrell Gave End.  1. Please arrive at the [facility] at 10:30 AM (This time is one hour before your procedure to ensure your preparation). Free valet parking service is available.   Special note: Every effort is made to have your procedure done on time. Please understand that emergencies sometimes delay scheduled procedures.  2. Diet: Do not eat solid foods after midnight.  The patient may have clear liquids until 5am upon the day of the procedure.  3. Labs: To be determined.  4. Medication instructions in preparation for your procedure:   Contrast Allergy: No  On the morning of your procedure, take your Aspirin and any morning medicines NOT listed above.  You may use sips of water.  5. Plan for one night stay--bring personal belongings. 6. Bring a current list of your medications and current insurance cards. 7.  You MUST have a responsible person to drive you home. 8. Someone MUST be with you the first 24 hours after you arrive home or your discharge will be delayed. 9. Please wear clothes that are easy to get on and off and wear slip-on shoes.  Thank you for allowing [place] to care for you!   -- [facility]    Follow-Up: At [facility], you and your health needs are our priority.  As part of our continuing mission to provide you with exceptional heart care, we have created designated Provider Care Teams.  These Care Teams include your primary Cardiologist (physician) and Advanced Practice Providers (APPs -  Physician Assistants and Nurse Practitioners) who all work together to provide you with the care you need, when you need it.  We recommend signing up for the patient portal called "MyChart".  Sign up information is provided on this After Visit Summary.  MyChart is used to connect with patients for Virtual Visits (Telemedicine).  Patients are able to view lab/test results, encounter notes, upcoming appointments, etc.  Non-urgent messages can be sent to your provider as well.   To learn more about what you can do with MyChart, go to [email or web address].    Your next appointment:   3 week(s) post cath which is scheduled for 03/20/20.   The format for your next appointment:   In Person  Provider:   You may see DR Harrell Gave  END or one of the following Advanced Practice Providers on your designated Care Team:    Murray Hodgkins, NP  Christell Faith, PA-C  Marrianne Mood, PA-C  Cadence [place], [place]  Laurann Montana, NP

## 2020-03-15 NOTE — H&P (View-Only) (Signed)
 New Outpatient Visit Date: 03/15/2020  Referring Provider: Cody, Jessica R, MD [address] [place],  [place] [address]  Chief Complaint: Palpitations  HPI:  Jason [name] is a 64 y.o. male who is being seen today for the evaluation of palpitations at the request of Dr. Cody. He has a history of hypertension, thyroid disease, GERD, and osteoarthritis. Jason [name] states that he was doing well until 01/31/20.  He was driving home with his wife after having exercised at the [facility] gym.  He suddenly developed an unwell feeling in his chest.  He states that it was not painful but seemed more like a flutter.  It was accompanied by numbness in both hands.  He went to the [facility] ED and was ultimately dx'ed with a panic attack, though PVC's were observed.  HS-TnI was mildly elevated but flat.  In hindsight, he also felt somewhat unwell while doing cardio at the gym earlier that day.  He had been feeling well until 2 days ago, when he experienced a similar episode after having exercised.  The palpitations and unwell sensation resolved with deep breathing after a coule of minutes.  He has felt well since then.  Jason [name] denies shortness of breath, edema, and orthopnea.  He has occasional lightheadedness, which is not associated with the aforementioned palpitations or "unwell" feeling.  He reports an episode of chest pain in 2002.  Subsequent stress test was reportedly abnormal and led to catheterization by Jason [name] that showed no significant CAD. . --------------------------------------------------------------------------------------------------  Cardiovascular History & Procedures: Cardiovascular Problems:  Chest discomfort and palpitations  Risk Factors:  Hypertension, male gender, obesity, and age > 55  Cath/PCI:  LHC (04/23/2000): Normal coronary arteries.  Normal LVEF.  Tortuous iliac arteries.  CV Surgery:  None  EP Procedures and Devices:  None  Non-Invasive Evaluation(s):  None  available  Recent CV Pertinent Labs: Lab Results  Component Value Date   CHOL 157 10/07/2016   CHOL 152 08/18/2014   HDL 48 10/07/2016   HDL 54 08/18/2014   LDLCALC 98 10/07/2016   LDLCALC 88 08/18/2014   TRIG 55 10/07/2016   CHOLHDL 2.8 08/18/2014   K 4.2 03/14/2020   BUN 11 03/14/2020   CREATININE 0.88 03/14/2020    --------------------------------------------------------------------------------------------------  Past Medical History:  Diagnosis Date  . Allergy   . Arthritis    osteoarthritis  . GERD (gastroesophageal reflux disease)   . Hypertension   . Thyroid disease    Hyperthyroidism s/p radioactive iodine ablation    Past Surgical History:  Procedure Laterality Date  . CARDIAC CATHETERIZATION  03-2000  . ROTATOR CUFF REPAIR  10/12   Dr Miller  . TONSILLECTOMY    . 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 TOTAL HIP ARTHROPLASTY Left 06/2016   Dr Bowers    Current Meds  Medication Sig  . acetaminophen (TYLENOL) 325 MG tablet Take 650 mg by mouth 2 (two) times daily as needed.  . Ascorbic Acid (VITA-C PO) Take 1,000 mg by mouth daily.  . cetirizine (ZYRTEC) 10 MG tablet Take 10 mg by mouth daily.  . CRANBERRY PO Take 1 tablet by mouth daily.  . cyclobenzaprine (FLEXERIL) 10 MG tablet TAKE 1 TABLET BY MOUTH EVERY DAY AT NIGHT AS NEEDED  . levothyroxine (SYNTHROID) 88 MCG tablet Take 1 tablet (88 mcg total) by mouth daily.  . lisinopril-hydrochlorothiazide (ZESTORETIC) 20-25 MG tablet Take 1 tablet by mouth once daily  . loratadine (CLARITIN) 10 MG tablet Take 10 mg   by mouth daily. Can take 2 tabs if needed  . Multiple Vitamin (MULTIVITAMIN) tablet Take 1 tablet by mouth daily.  . sildenafil (REVATIO) 20 MG tablet Take 3-5 tablets (60-100 mg total) by mouth daily as needed.  . tamsulosin (FLOMAX) 0.4 MG CAPS capsule TAKE 1 CAPSULE BY MOUTH EVERY DAY    Allergies: Patient has no known allergies.  Social History   Tobacco Use  . Smoking status: Never  Smoker  . Smokeless tobacco: Never Used  Vaping Use  . Vaping Use: Never used  Substance Use Topics  . Alcohol use: Yes    Comment: Alcohol once every few weeks/months  . Drug use: No    Family History  Problem Relation Age of Onset  . Gout Father   . Heart attack Father   . Arrhythmia Mother   . Hypertension Mother   . Colon cancer Neg Hx   . Esophageal cancer Neg Hx   . Rectal cancer Neg Hx   . Stomach cancer Neg Hx     Review of Systems: A 12-system review of systems was performed and was negative except as noted in the HPI.  --------------------------------------------------------------------------------------------------  Physical Exam: BP (!) 152/98 (BP Location: Right Arm, Patient Position: Sitting, Cuff Size: Normal)   Pulse 90   Ht 5' 8.25" (1.734 m)   Wt 212 lb (96.2 kg)   SpO2 96%   BMI 32.00 kg/m   General:  NAD. HEENT: No conjunctival pallor or scleral icterus. Facemask in place. Neck: Supple without lymphadenopathy, thyromegaly, JVD, or HJR. No carotid bruit. Lungs: Normal work of breathing. Clear to auscultation bilaterally without wheezes or crackles. Heart: Regular rate and rhythm without murmurs, rubs, or gallops. Non-displaced PMI. Abd: Bowel sounds present. Soft, NT/ND without hepatosplenomegaly Ext: No lower extremity edema. Radial, PT, and DP pulses are 2+ bilaterally Skin: Warm and dry without rash. Neuro: CNIII-XII intact. Strength and fine-touch sensation intact in upper and lower extremities bilaterally. Psych: Normal mood and affect.  EKG:  Normal sinus rhythm with lateral ST/T changes, mor pronounced than prior tracing on 02/01/2020.  PVC's are no longer present.  Lab Results  Component Value Date   WBC 9.6 01/31/2020   HGB 15.3 01/31/2020   HCT 43.9 01/31/2020   MCV 92.8 01/31/2020   PLT 265 01/31/2020    Lab Results  Component Value Date   NA 139 03/14/2020   K 4.2 03/14/2020   CL 98 03/14/2020   CO2 28 03/14/2020   BUN 11  03/14/2020   CREATININE 0.88 03/14/2020   GLUCOSE 80 03/14/2020   ALT 42 10/10/2019    Lab Results  Component Value Date   CHOL 157 10/07/2016   HDL 48 10/07/2016   LDLCALC 98 10/07/2016   TRIG 55 10/07/2016   CHOLHDL 2.8 08/18/2014     --------------------------------------------------------------------------------------------------  ASSESSMENT AND PLAN: Unstable angina: Though symptoms consisting primarily of "unwell" chest sensation, palpitations, and hand numbness are atypical, I am concerned that recent episodes may reflect coronary insufficiency, given association with exercise, as well as ST/T changes on EKG today, frequent PVC's noted during recent ED visit, as well as mild (albeit flat) troponin elevation during ED visit last month.  As Jason [name] is currently asymptomatic, I think it is reasonable to proceed with outpatient catheterization on an expedited basis.  Jason [name] is in agreement with this.  In the meantime, we will start aspirin 81 mg daily and metoprolol succinate 25 mg daily.  I will defer adding NTG given intermittent sildenafil  use pending catheterization.  I advised Jason [name] to call 911 or seek immediate medical attention if he has recurrent symptoms.  Shared Decision Making/Informed Consent The risks [stroke (1 in 1000), death (1 in 1000), kidney failure [usually temporary] (1 in 500), bleeding (1 in 200), allergic reaction [possibly serious] (1 in 200)], benefits (diagnostic support and management of coronary artery disease) and alternatives of a cardiac catheterization were discussed in detail with Jason [name] and he is willing to proceed.  Hypertension: Blood pressure mildly elevated today.  We will start metoprolol succinate 25 mg daily; continue current dose of lisinopril-HCTZ.  PVC's and palpitations: We will add metoprolol, as above.  Consider ambulatory cardiac monitoring if symptoms persist and catheterization is unrevealing.  Follow-up: Return  to clinic in 3 weels.  Nelva Bush, MD 03/15/2020 4:20 PM

## 2020-03-16 ENCOUNTER — Other Ambulatory Visit
Admission: RE | Admit: 2020-03-16 | Discharge: 2020-03-16 | Disposition: A | Discharge status: Home / Self Care | Payer: BC Managed Care – PPO | Source: Ambulatory Visit | Attending: Internal Medicine | Admitting: Internal Medicine

## 2020-03-16 ENCOUNTER — Other Ambulatory Visit
Admission: RE | Admit: 2020-03-16 | Discharge: 2020-03-16 | Disposition: A | Discharge status: Home / Self Care | Payer: BC Managed Care – PPO | Attending: Internal Medicine | Admitting: Internal Medicine

## 2020-03-16 ENCOUNTER — Telehealth: Payer: Self-pay | Admitting: Internal Medicine

## 2020-03-16 ENCOUNTER — Other Ambulatory Visit: Payer: Self-pay

## 2020-03-16 DIAGNOSIS — Z20822 Contact with and (suspected) exposure to covid-19: Secondary | ICD-10-CM | POA: Diagnosis not present

## 2020-03-16 DIAGNOSIS — I200 Unstable angina: Secondary | ICD-10-CM | POA: Diagnosis not present

## 2020-03-16 DIAGNOSIS — Z01812 Encounter for preprocedural laboratory examination: Secondary | ICD-10-CM | POA: Insufficient documentation

## 2020-03-16 DIAGNOSIS — Z01810 Encounter for preprocedural cardiovascular examination: Secondary | ICD-10-CM

## 2020-03-16 LAB — CBC WITH DIFFERENTIAL/PLATELET
Abs Immature Granulocytes: 0.02 10*3/uL (ref 0.00–0.07)
Basophils Absolute: 0.1 10*3/uL (ref 0.0–0.1)
Basophils Relative: 1 %
Eosinophils Absolute: 0.3 10*3/uL (ref 0.0–0.5)
Eosinophils Relative: 4 %
HCT: 45.9 % (ref 39.0–52.0)
Hemoglobin: 15.5 g/dL (ref 13.0–17.0)
Immature Granulocytes: 0 %
Lymphocytes Relative: 27 %
Lymphs Abs: 2 10*3/uL (ref 0.7–4.0)
MCH: 31.2 pg (ref 26.0–34.0)
MCHC: 33.8 g/dL (ref 30.0–36.0)
MCV: 92.4 fL (ref 80.0–100.0)
Monocytes Absolute: 0.6 10*3/uL (ref 0.1–1.0)
Monocytes Relative: 7 %
Neutro Abs: 4.6 10*3/uL (ref 1.7–7.7)
Neutrophils Relative %: 61 %
Platelets: 269 10*3/uL (ref 150–400)
RBC: 4.97 MIL/uL (ref 4.22–5.81)
RDW: 13 % (ref 11.5–15.5)
WBC: 7.6 10*3/uL (ref 4.0–10.5)
nRBC: 0 % (ref 0.0–0.2)

## 2020-03-16 NOTE — Telephone Encounter (Signed)
lmov to schedule fu visit .  1/25 cath .  Patient should be seen 3 weeks after.

## 2020-03-16 NOTE — Telephone Encounter (Signed)
Called [facility] to see if the CBC can be added on. Representative said they did not collect the lavender top tube that would have been needed to add on the CBC.  Therefore, patient will still need a CBC.   Spoke with patient. He verbalized understanding that he will need to go to the [facility] for lab work first, and then get back into his car to get [place] tested at the [facility] testing site at the [place] entrance. He was appreciative.

## 2020-03-17 LAB — SARS CORONAVIRUS 2 (TAT 6-24 HRS): SARS Coronavirus 2: NEGATIVE

## 2020-03-19 ENCOUNTER — Other Ambulatory Visit: Admission: RE | Admit: 2020-03-19 | Payer: BC Managed Care – PPO | Source: Ambulatory Visit

## 2020-03-20 ENCOUNTER — Ambulatory Visit
Admission: RE | Admit: 2020-03-20 | Discharge: 2020-03-20 | Disposition: A | Discharge status: Home / Self Care | Payer: BC Managed Care – PPO | Attending: Internal Medicine | Admitting: Internal Medicine

## 2020-03-20 ENCOUNTER — Other Ambulatory Visit: Payer: Self-pay

## 2020-03-20 ENCOUNTER — Encounter
Admission: RE | Disposition: A | Discharge status: Home / Self Care | Payer: Self-pay | Source: Home / Self Care | Attending: Internal Medicine

## 2020-03-20 ENCOUNTER — Encounter: Payer: Self-pay | Admitting: Internal Medicine

## 2020-03-20 DIAGNOSIS — R002 Palpitations: Secondary | ICD-10-CM | POA: Diagnosis not present

## 2020-03-20 DIAGNOSIS — I493 Ventricular premature depolarization: Secondary | ICD-10-CM | POA: Insufficient documentation

## 2020-03-20 DIAGNOSIS — Z79899 Other long term (current) drug therapy: Secondary | ICD-10-CM | POA: Insufficient documentation

## 2020-03-20 DIAGNOSIS — Z79890 Hormone replacement therapy (postmenopausal): Secondary | ICD-10-CM | POA: Insufficient documentation

## 2020-03-20 DIAGNOSIS — I10 Essential (primary) hypertension: Secondary | ICD-10-CM | POA: Insufficient documentation

## 2020-03-20 DIAGNOSIS — I200 Unstable angina: Secondary | ICD-10-CM | POA: Insufficient documentation

## 2020-03-20 DIAGNOSIS — R079 Chest pain, unspecified: Secondary | ICD-10-CM

## 2020-03-20 HISTORY — PX: LEFT HEART CATH AND CORONARY ANGIOGRAPHY: CATH118249

## 2020-03-20 SURGERY — LEFT HEART CATH AND CORONARY ANGIOGRAPHY
Anesthesia: Moderate Sedation | Laterality: Left

## 2020-03-20 MED ORDER — HEPARIN SODIUM (PORCINE) 1000 UNIT/ML IJ SOLN
INTRAMUSCULAR | Status: AC
  Filled 2020-03-20: qty 1

## 2020-03-20 MED ORDER — FENTANYL CITRATE (PF) 100 MCG/2ML IJ SOLN
INTRAMUSCULAR | Status: DC | PRN
  Administered 2020-03-20: 50 ug via INTRAVENOUS

## 2020-03-20 MED ORDER — IOHEXOL 300 MG/ML  SOLN
INTRAMUSCULAR | Status: DC | PRN
  Administered 2020-03-20: 80 mL

## 2020-03-20 MED ORDER — SODIUM CHLORIDE 0.9 % IV SOLN: INTRAVENOUS | Status: DC

## 2020-03-20 MED ORDER — LABETALOL HCL 5 MG/ML IV SOLN: 10.0000 mg | INTRAVENOUS | Status: DC | PRN

## 2020-03-20 MED ORDER — VERAPAMIL HCL 2.5 MG/ML IV SOLN
INTRAVENOUS | Status: DC | PRN
  Administered 2020-03-20: 2.5 mg via INTRA_ARTERIAL

## 2020-03-20 MED ORDER — SODIUM CHLORIDE 0.9% FLUSH: 3.0000 mL | Freq: Two times a day (BID) | INTRAVENOUS | Status: DC

## 2020-03-20 MED ORDER — ASPIRIN 81 MG PO CHEW: 81.0000 mg | CHEWABLE_TABLET | ORAL | Status: DC

## 2020-03-20 MED ORDER — SODIUM CHLORIDE 0.9 % WEIGHT BASED INFUSION: 1.0000 mL/kg/h | INTRAVENOUS | Status: DC

## 2020-03-20 MED ORDER — SODIUM CHLORIDE 0.9% FLUSH: 3.0000 mL | INTRAVENOUS | Status: DC | PRN

## 2020-03-20 MED ORDER — FENTANYL CITRATE (PF) 100 MCG/2ML IJ SOLN
INTRAMUSCULAR | Status: AC
  Filled 2020-03-20: qty 2

## 2020-03-20 MED ORDER — HEPARIN SODIUM (PORCINE) 1000 UNIT/ML IJ SOLN
INTRAMUSCULAR | Status: DC | PRN
  Administered 2020-03-20: 5000 [IU] via INTRAVENOUS

## 2020-03-20 MED ORDER — MIDAZOLAM HCL 2 MG/2ML IJ SOLN
INTRAMUSCULAR | Status: AC
  Filled 2020-03-20: qty 2

## 2020-03-20 MED ORDER — VERAPAMIL HCL 2.5 MG/ML IV SOLN
INTRAVENOUS | Status: AC
  Filled 2020-03-20: qty 2

## 2020-03-20 MED ORDER — HEPARIN (PORCINE) IN NACL 1000-0.9 UT/500ML-% IV SOLN
INTRAVENOUS | Status: AC
  Filled 2020-03-20: qty 1000

## 2020-03-20 MED ORDER — SODIUM CHLORIDE 0.9 % IV SOLN: 250.0000 mL | INTRAVENOUS | Status: DC | PRN

## 2020-03-20 MED ORDER — MIDAZOLAM HCL 2 MG/2ML IJ SOLN
INTRAMUSCULAR | Status: DC | PRN
  Administered 2020-03-20 (×2): 1 mg via INTRAVENOUS

## 2020-03-20 MED ORDER — HYDRALAZINE HCL 20 MG/ML IJ SOLN: 10.0000 mg | INTRAMUSCULAR | Status: DC | PRN

## 2020-03-20 MED ORDER — ONDANSETRON HCL 4 MG/2ML IJ SOLN: 4.0000 mg | Freq: Four times a day (QID) | INTRAMUSCULAR | Status: DC | PRN

## 2020-03-20 MED ORDER — HEPARIN (PORCINE) IN NACL 1000-0.9 UT/500ML-% IV SOLN
INTRAVENOUS | Status: DC | PRN
  Administered 2020-03-20: 500 mL

## 2020-03-20 MED ORDER — SODIUM CHLORIDE 0.9 % WEIGHT BASED INFUSION
3.0000 mL/kg/h | INTRAVENOUS | Status: AC
  Administered 2020-03-20: 3 mL/kg/h via INTRAVENOUS

## 2020-03-20 MED ORDER — ACETAMINOPHEN 325 MG PO TABS: 650.0000 mg | ORAL_TABLET | ORAL | Status: DC | PRN

## 2020-03-20 MED ORDER — HYDRALAZINE HCL 20 MG/ML IJ SOLN
INTRAMUSCULAR | Status: AC
  Administered 2020-03-20: 10 mg via INTRAVENOUS
  Filled 2020-03-20: qty 1

## 2020-03-20 SURGICAL SUPPLY — 8 items
CATH 5F 110X4 TIG (CATHETERS) ×2 IMPLANT
CATH INFINITI 5FR ANG PIGTAIL (CATHETERS) ×2 IMPLANT
DEVICE RAD TR BAND REGULAR (VASCULAR PRODUCTS) ×2 IMPLANT
GLIDESHEATH SLEND A-KIT 6F 22G (SHEATH) ×2 IMPLANT
GUIDEWIRE INQWIRE 1.5J.035X260 (WIRE) ×1 IMPLANT
INQWIRE 1.5J .035X260CM (WIRE) ×2
KIT MANI 3VAL PERCEP (MISCELLANEOUS) ×2 IMPLANT
PACK CARDIAC CATH (CUSTOM PROCEDURE TRAY) ×2 IMPLANT

## 2020-03-20 NOTE — Interval H&P Note (Signed)
History and Physical Interval Note:  03/20/2020 11:54 AM  Jason [name]  has presented today for surgery, with the diagnosis of unstable angina.  The various methods of treatment have been discussed with the patient and family. After consideration of risks, benefits and other options for treatment, the patient has consented to  Procedure(s): LEFT HEART CATH AND CORONARY ANGIOGRAPHY (Left) as a surgical intervention.  The patient's history has been reviewed, patient examined, no change in status, stable for surgery.  I have reviewed the patient's chart and labs.  Questions were answered to the patient's satisfaction.    Cath Lab Visit (complete for each Cath Lab visit)  Clinical Evaluation Leading to the Procedure:   ACS: No.  Non-ACS:    Anginal Classification: CCS III  Anti-ischemic medical therapy: Minimal Therapy (1 class of medications)  Non-Invasive Test Results: No non-invasive testing performed  Prior CABG: No previous CABG  Merilee Wible

## 2020-03-27 NOTE — Progress Notes (Signed)
Cardiology Office Note    Date:  03/27/2020   ID:  Jason [name], DOB 1955/04/07, MRN [id number]  PCP:  Jason Carbon, MD  Cardiologist:  Nelva Bush, MD  Electrophysiologist:  None   Chief Complaint: Cardiac catheterization follow up  History of Present Illness:   Jason [name] is a 65 y.o. male with history of HTN, thyroid disease, OA, and GERD who presents for follow up after recent diagnostic LHC.   He underwent remote stress test in 2002 which led to a cardiac cath at that time that showed no significant CAD.   He was seen as a new patient by Dr. Saunders Revel on 03/15/2020 for evaluation of palpitations. At that time, he reported feeling well up until 01/31/2020, while driving home after exercising at the [facility] gym he reported a flutter and "unwell" sensation in his chest. He was seen in the [facility] ED where HS-Tn 26 with a delta of 24 trending down to 21. He was diagnosed with a panic attack. PVC's was noted while in the ED. At his visit with cardiology he noted two similar episodes while exercising as well. These symptoms would resolve with deep breathing. Given his symptoms, EKG changes, and PVCs noted he underwent diagnostic LHC on 03/20/2020 that showed no angiographically significant CAD, normal LV contraction with mildly elevated filling pressures consistent with diastolic dysfunction. Primary prevention of CAD was recommended. He was advised to continue recently started metoprolol for noted PVCs.   Today, patient is overall doing well.  The cardiac catheterization was reviewed. Cath site has healed well. The patient went back to work on Monday at [employer]. Job is pretty active, but he has been slowly getting back into activity. The patient has been taking his metoprolol daily. Since the catheterization he reports recurrent palpitations. Most recent episode was yesterday morning while running around trying to catch his cat. It seems episodes are precipitated by exertion. Patient takes deep  breaths and then his palpitations seems to improve. He denies chest pain, shortness of breath, lower leg edema, orthopnea or pnd.  BP is high today 158/100. He takes it once a week on Saturday. He thinks BP is generally 130s/80s. He will try and decrease salt/caffeine intake.    Labs independently reviewed: 02/2020 - HGB 15.5, PLT 269, TSH 5.4, BUN 11, SCr 0.88, potassium 4.2 09/2019 - free T4 normal, albumin 4.4, AST/ALT normal 09/2016 - TC 157, TG 55, HDL 48, LDL 98  Past Medical History:  Diagnosis Date  . Allergy   . Arthritis    osteoarthritis  . GERD (gastroesophageal reflux disease)   . Hypertension   . Thyroid disease    Hyperthyroidism s/p radioactive iodine ablation    Past Surgical History:  Procedure Laterality Date  . CARDIAC CATHETERIZATION  03-2000  . LEFT HEART CATH AND CORONARY ANGIOGRAPHY Left 03/20/2020   Procedure: LEFT HEART CATH AND CORONARY ANGIOGRAPHY;  Surgeon: Nelva Bush, MD;  Location: [facility];  Service: Cardiovascular;  Laterality: Left;  . ROTATOR CUFF REPAIR  10/12   Dr [name] Heck  . TONSILLECTOMY    . 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 TOTAL HIP ARTHROPLASTY Left 06/2016   Dr Harlow Mares    Current Medications: No outpatient medications have been marked as taking for the 03/30/20 encounter (Appointment) with Rise Mu, PA-C.    Allergies:   Patient has no known allergies.   Social History   Socioeconomic History  . Marital status: Married    Spouse name:  [name]   . Number of children: 0  . Years of education: Not on file  . Highest education level: Not on file  Occupational History  . Occupation: Therapist, occupational: [employer]  Tobacco Use  . Smoking status: Never Smoker  . Smokeless tobacco: Never Used  Vaping Use  . Vaping Use: Never used  Substance and Sexual Activity  . Alcohol use: Yes    Comment: Alcohol once every few weeks/months  . Drug use: No  . Sexual activity: Not on file  Other Topics Concern   . Not on file  Social History Narrative   Lives at home with spouse.    Social Determinants of Health   Financial Resource Strain: Not on file  Food Insecurity: Not on file  Transportation Needs: Not on file  Physical Activity: Not on file  Stress: Not on file  Social Connections: Not on file     Family History:  The patient's family history includes Gout in his father; Heart attack (age of onset: 75) in his father; Hypertension in his mother; Stroke in his mother. There is no history of Colon cancer, Esophageal cancer, Rectal cancer, or Stomach cancer.  ROS:   ROS   EKGs/Labs/Other Studies Reviewed:    Studies reviewed were summarized above. The additional studies were reviewed today:  LHC 03/20/2020: Conclusions: 1. No angiographically significant coronary artery disease. 2. Normal left ventricular contraction with mildly elevated filling pressures consistent with diastolic dysfunction.  Recommendations: 1. Primary prevention of coronary artery disease. 2. Continue blood pressure control as well as recently added metoprolol for management of incidentally noted PVCs.  If symptoms persist, ambulatory cardiac monitoring +/- echcardiography will need to be considered.    EKG:  EKG is ordered today.  The EKG ordered today demonstrates NSR, PVC, 84 bpm, nonspecific ST changes  Recent Labs: 10/10/2019: ALT 42 03/14/2020: BUN 11; Creatinine, Ser 0.88; Potassium 4.2; Sodium 139; TSH 5.400 03/16/2020: Hemoglobin 15.5; Platelets 269  Recent Lipid Panel    Component Value Date/Time   CHOL 157 10/07/2016 0000   CHOL 152 08/18/2014 1121   TRIG 55 10/07/2016 0000   HDL 48 10/07/2016 0000   HDL 54 08/18/2014 1121   CHOLHDL 2.8 08/18/2014 1121   LDLCALC 98 10/07/2016 0000   LDLCALC 88 08/18/2014 1121    PHYSICAL EXAM:    VS:  There were no vitals taken for this visit.  BMI: There is no height or weight on file to calculate BMI.  Physical Exam  Wt Readings from Last 3  Encounters:  03/20/20 212 lb 1.3 oz (96.2 kg)  03/15/20 212 lb (96.2 kg)  03/14/20 208 lb 4 oz (94.5 kg)     ASSESSMENT & PLAN:   1. Chest pain: LHC showed normal coronaries. He denies recurrent chest pain or shortness of breath. He plans on getting back into the gym this week.   2. HTN: Blood pressure on the high side today, 158/100. At home he reports it is around 130s/80s. Before increasing medications he would like to try lifestyle changes. He will decrease salt intake and caffeine intake and take his BP daily for the next week, and call in with the results. If it is still high, can increase metoprolol. Continue Lisinopril-HCTZ.   3. Palpitations: This is what originally sent the patient to the ER in December 2021 and he was told he had a panic attack. PVCs were noted and subsequently underwent LHC that showed normal coronaries. He is taking metoprolol and  still has occasional palpitations which seemed to be precipitated by exercise. Recent TSH was mildly elevated, other labs were unremarkable. I will order a 2 week heart monitor to further evaluate palpitations. There is room for further titration if necessary.   Disposition: F/u with Dr. Saunders Revel or an APP in 5-6 weeks.   Medication Adjustments/Labs and Tests Ordered: Current medicines are reviewed at length with the patient today.  Concerns regarding medicines are outlined above. Medication changes, Labs and Tests ordered today are summarized above and listed in the Patient Instructions accessible in Encounters.   Signed, Elliot Meldrum Kathlen Mody, PA-C 03/27/2020 7:23 AM     [address] [facility] [place], [place] [address] [phone]

## 2020-03-29 ENCOUNTER — Ambulatory Visit: Payer: BC Managed Care – PPO | Admitting: Internal Medicine

## 2020-03-30 ENCOUNTER — Ambulatory Visit (INDEPENDENT_AMBULATORY_CARE_PROVIDER_SITE_OTHER): Payer: BC Managed Care – PPO | Admitting: Medical

## 2020-03-30 ENCOUNTER — Other Ambulatory Visit: Payer: Self-pay

## 2020-03-30 ENCOUNTER — Ambulatory Visit (INDEPENDENT_AMBULATORY_CARE_PROVIDER_SITE_OTHER): Payer: BC Managed Care – PPO

## 2020-03-30 ENCOUNTER — Encounter: Payer: Self-pay | Admitting: Physician Assistant

## 2020-03-30 VITALS — BP 158/100 | HR 84 | Ht 68.25 in | Wt 214.0 lb

## 2020-03-30 DIAGNOSIS — I10 Essential (primary) hypertension: Secondary | ICD-10-CM

## 2020-03-30 DIAGNOSIS — I493 Ventricular premature depolarization: Secondary | ICD-10-CM

## 2020-03-30 DIAGNOSIS — R002 Palpitations: Secondary | ICD-10-CM

## 2020-03-30 NOTE — Patient Instructions (Addendum)
Medication Instructions:  No changes  *If you need a refill on your cardiac medications before your next appointment, please call your pharmacy*   Lab Work: None  If you have labs (blood work) drawn today and your tests are completely normal, you will receive your results only by: Marland Kitchen MyChart Message (if you have MyChart) OR . A paper copy in the mail If you have any lab test that is abnormal or we need to change your treatment, we will call you to review the results.   Testing/Procedures: Your physician has recommended that you wear a Zio monitor. This monitor is a medical device that records the heart's electrical activity. Doctors most often use these monitors to diagnose arrhythmias. Arrhythmias are problems with the speed or rhythm of the heartbeat. The monitor is a small device applied to your chest. You can wear one while you do your normal daily activities. While wearing this monitor if you have any symptoms to push the button and record what you felt. Once you have worn this monitor for the period of time provider prescribed (Usually 14 days), you will return the monitor device in the postage paid box. Once it is returned they will download the data collected and provide [place] with a report which the provider will then review and we will call you with those results. Important tips:  1. Avoid showering during the first 24 hours of wearing the monitor. 2. Avoid excessive sweating to help maximize wear time. 3. Do not submerge the device, no hot tubs, and no swimming pools. 4. Keep any lotions or oils away from the patch. 5. After 24 hours you may shower with the patch on. Take brief showers with your back facing the shower head.  6. Do not remove patch once it has been placed because that will interrupt data and decrease adhesive wear time. 7. Push the button when you have any symptoms and write down what you were feeling. 8. Once you have completed wearing your monitor, remove and place  into box which has postage paid and place in your outgoing mailbox.  9. If for some reason you have misplaced your box then call our office and we can provide another box and/or mail it off for you.         Follow-Up: At [facility], you and your health needs are our priority.  As part of our continuing mission to provide you with exceptional heart care, we have created designated Provider Care Teams.  These Care Teams include your primary Cardiologist (physician) and Advanced Practice Providers (APPs -  Physician Assistants and Nurse Practitioners) who all work together to provide you with the care you need, when you need it.   Your next appointment:   5 week(s)  The format for your next appointment:   In Person  Provider:   You may see Nelva Bush, MD or one of the following Advanced Practice Providers on your designated Care Team:    Murray Hodgkins, NP  Christell Faith, PA-C  Marrianne Mood, PA-C  Cadence Kathlen Mody, [place]  Laurann Montana, NP    Other Instructions Please monitor blood pressures and keep a log of your readings.  Make sure to check 2 hours after your medications.   AVOID these things for 30 minutes before checking your blood pressure:  No Drinking caffeine.  No Drinking alcohol.  No Eating.  No Smoking.  No Exercising.  Five minutes before checking your blood pressure:  Pee.  Sit in a dining  chair. Avoid sitting in a soft couch or armchair.  Be quiet. Do not talk.    How to Take Your Blood Pressure Blood pressure measures how strongly your blood is pressing against the walls of your arteries. Arteries are blood vessels that carry blood from your heart throughout your body. You can take your blood pressure at home with a machine. You may need to check your blood pressure at home:  To check if you have high blood pressure (hypertension).  To check your blood pressure over time.  To make sure your blood pressure medicine is  working. Supplies needed:  Blood pressure machine, or monitor.  Dining room chair to sit in.  Table or desk.  Small notebook.  Pencil or pen. How to prepare Avoid these things for 30 minutes before checking your blood pressure:  Having drinks with caffeine in them, such as coffee or tea.  Drinking alcohol.  Eating.  Smoking.  Exercising. Do these things five minutes before checking your blood pressure:  Go to the bathroom and pee (urinate).  Sit in a dining chair. Do not sit in a soft couch or an armchair.  Be quiet. Do not talk. How to take your blood pressure Follow the instructions that came with your machine. If you have a digital blood pressure monitor, these may be the instructions: 1. Sit up straight. 2. Place your feet on the floor. Do not cross your ankles or legs. 3. Rest your left arm at the level of your heart. You may rest it on a table, desk, or chair. 4. Pull up your shirt sleeve. 5. Wrap the blood pressure cuff around the upper part of your left arm. The cuff should be 1 inch (2.5 cm) above your elbow. It is best to wrap the cuff around bare skin. 6. Fit the cuff snugly around your arm. You should be able to place only one finger between the cuff and your arm. 7. Place the cord so that it rests in the bend of your elbow. 8. Press the power button. 9. Sit quietly while the cuff fills with air and loses air. 10. Write down the numbers on the screen. 11. Wait 2-3 minutes and then repeat steps 1-10.   What do the numbers mean? Two numbers make up your blood pressure. The first number is called systolic pressure. The second is called diastolic pressure. An example of a blood pressure reading is "120 over 80" (or 120/80). If you are an adult and do not have a medical condition, use this guide to find out if your blood pressure is normal: Normal  First number: below 120.  Second number: below 80. Elevated  First number: 120-129.  Second number: below  80. Hypertension stage 1  First number: 130-139.  Second number: 80-89. Hypertension stage 2  First number: 140 or above.  Second number: 75 or above. Your blood pressure is above normal even if only the top or bottom number is above normal. Follow these instructions at home:  Check your blood pressure as often as your doctor tells you to.  Check your blood pressure at the same time every day.  Take your monitor to your next doctor's appointment. Your doctor will: ? Make sure you are using it correctly. ? Make sure it is working right.  Make sure you understand what your blood pressure numbers should be.  Tell your doctor if your medicine is causing side effects.  Keep all follow-up visits as told by your doctor. This  is important. General tips:  You will need a blood pressure machine, or monitor. Your doctor can suggest a monitor. You can buy one at a drugstore or online. When choosing one: ? Choose one with an arm cuff. ? Choose one that wraps around your upper arm. Only one finger should fit between your arm and the cuff. ? Do not choose one that measures your blood pressure from your wrist or finger. Where to find more information American Heart Association: www.heart.org Contact a doctor if:  Your blood pressure keeps being high. Get help right away if:  Your first blood pressure number is higher than 180.  Your second blood pressure number is higher than 120. Summary  Check your blood pressure at the same time every day.  Avoid caffeine, alcohol, smoking, and exercise for 30 minutes before checking your blood pressure.  Make sure you understand what your blood pressure numbers should be. This information is not intended to replace advice given to you by your health care provider. Make sure you discuss any questions you have with your health care provider. Document Revised: 02/04/2019 Document Reviewed: 02/04/2019 Elsevier Patient Education  2021 [employer].

## 2020-04-10 ENCOUNTER — Other Ambulatory Visit: Payer: Self-pay | Admitting: Internal Medicine

## 2020-04-27 MED ORDER — METOPROLOL SUCCINATE ER 50 MG PO TB24: 50.0000 mg | ORAL_TABLET | Freq: Every day | ORAL | 3 refills | Status: DC

## 2020-04-27 NOTE — Telephone Encounter (Signed)
-----   Message from [place], PA-C sent at 04/27/2020  1:13 PM EST ----- Please call patient and inform him heart monitor showed predominately normal rhythm with rare PACs/PVCs (irregular heart beat), brief episodes of elevated heart rate called PSVT and NSVT. Also ask what his blood pressures at home have been running. He has an upcoming appointment, BB can be increased if pressures can tolerate.

## 2020-04-27 NOTE — Telephone Encounter (Signed)
Furth, Cadence H, PA-C  You 4 minutes ago (1:15 PM)     Please call patient and let him know overall readings are high. Lets increase the BB to Toprol XL 50mg  daily.

## 2020-04-27 NOTE — Telephone Encounter (Signed)
-----   Message from [place], PA-C sent at 04/27/2020  1:13 PM EST ----- Please call patient and inform him heart monitor showed predominately normal rhythm with rare PACs/PVCs (irregular heart beat), brief episodes of elevated heart rate called PSVT and NSVT. Also ask what his blood pressures at home have been running. He has an upcoming appointment, BB can be increased if pressures can tolerate.

## 2020-04-27 NOTE — Telephone Encounter (Signed)
Left voicemail message to call back for results and recommendations. 

## 2020-04-27 NOTE — Telephone Encounter (Signed)
Spoke with patients wife per release form and reviewed results and recommendations to increase medication. She verbalized understanding of increase and results. No further questions at this time.

## 2020-05-07 ENCOUNTER — Encounter: Payer: Self-pay | Admitting: Family

## 2020-05-11 ENCOUNTER — Other Ambulatory Visit: Payer: Self-pay

## 2020-05-11 ENCOUNTER — Encounter: Payer: Self-pay | Admitting: Medical

## 2020-05-11 ENCOUNTER — Ambulatory Visit (INDEPENDENT_AMBULATORY_CARE_PROVIDER_SITE_OTHER): Payer: BC Managed Care – PPO | Admitting: Medical

## 2020-05-11 VITALS — BP 150/102 | HR 72 | Ht 68.25 in | Wt 210.0 lb

## 2020-05-11 DIAGNOSIS — R002 Palpitations: Secondary | ICD-10-CM

## 2020-05-11 DIAGNOSIS — I10 Essential (primary) hypertension: Secondary | ICD-10-CM

## 2020-05-11 DIAGNOSIS — I493 Ventricular premature depolarization: Secondary | ICD-10-CM | POA: Diagnosis not present

## 2020-05-11 MED ORDER — METOPROLOL SUCCINATE ER 100 MG PO TB24: 100.0000 mg | ORAL_TABLET | Freq: Every day | ORAL | 1 refills | Status: DC

## 2020-05-11 NOTE — Progress Notes (Signed)
Cardiology Office Note:    Date:  05/11/2020   ID:  Jason [name], DOB 09-Jan-1956, MRN [id number]  PCP:  Venia Carbon, MD  [facility] Cardiologist:  Nelva Bush, MD  [facility] Electrophysiologist:  None   Referring MD: Venia Carbon, MD   Chief Complaint: Zio monitor follow-up  History of Present Illness:    Jason [name] is a 65 y.o. male with a hx of HTN, thyroid disease, GERD who presents for heart monitor follow-up.   The patient underwent remote stress test in 2002 which led to cardiac cath that showed no significant CAD.   The patient was recently evaluated for palpitatios. He eventually underwent cardiac cath that showed no significant CAD, normal LV function with mildly elevated filling pressures consistent with diastolic dysfunction. H was started on metorpolol for PVCs.   Seen in the office 03/30/20 reporting palpitations. BP was higher. A heart monitor was ordered that showed SR, rare PVC/PACs, NSVT, PSVT.   Today, heart monitor reviewed. Patient reports he has been doing well since the last visit. He has occasional palpitations. He feels deep breathing helps. Seems to happen when he gets stressed. He has been doing light cardio and more weight training. No chest pain or sob. BP is up today, he feels like this is because he is in a hurry.    Past Medical History:  Diagnosis Date  . Allergy   . Arthritis    osteoarthritis  . GERD (gastroesophageal reflux disease)   . Hypertension   . Thyroid disease    Hyperthyroidism s/p radioactive iodine ablation    Past Surgical History:  Procedure Laterality Date  . CARDIAC CATHETERIZATION  03-2000  . LEFT HEART CATH AND CORONARY ANGIOGRAPHY Left 03/20/2020   Procedure: LEFT HEART CATH AND CORONARY ANGIOGRAPHY;  Surgeon: Nelva Bush, MD;  Location: [facility];  Service: Cardiovascular;  Laterality: Left;  . ROTATOR CUFF REPAIR  10/12   Dr [name] Heck  . TONSILLECTOMY    . TOTAL HIP  ARTHROPLASTY Right 02/24/2009  . TOTAL HIP ARTHROPLASTY Left 06/2016   Dr Harlow Mares    Current Medications: Current Meds  Medication Sig  . acetaminophen (TYLENOL) 325 MG tablet Take 650 mg by mouth 2 (two) times daily as needed.  . Ascorbic Acid (VITA-C PO) Take 1,000 mg by mouth daily.  Marland Kitchen CRANBERRY PO Take 1 tablet by mouth daily.  . cyclobenzaprine (FLEXERIL) 10 MG tablet TAKE 1 TABLET BY MOUTH EVERY DAY AT NIGHT AS NEEDED  . levothyroxine (SYNTHROID) 88 MCG tablet Take 1 tablet (88 mcg total) by mouth daily.  Marland Kitchen lisinopril-hydrochlorothiazide (ZESTORETIC) 20-25 MG tablet Take 1 tablet by mouth once daily  . loratadine (CLARITIN) 10 MG tablet Take 10 mg by mouth daily. Can take 2 tabs if needed  . Multiple Vitamin (MULTIVITAMIN) tablet Take 1 tablet by mouth daily.  . sildenafil (REVATIO) 20 MG tablet Take 3-5 tablets (60-100 mg total) by mouth daily as needed.  . tamsulosin (FLOMAX) 0.4 MG CAPS capsule TAKE 1 CAPSULE BY MOUTH EVERY DAY  . [DISCONTINUED] metoprolol succinate (TOPROL XL) 50 MG 24 hr tablet Take 1 tablet (50 mg total) by mouth daily. Take with or immediately following a meal.     Allergies:   Patient has no known allergies.   Social History   Socioeconomic History  . Marital status: Married    Spouse name: [name]   . Number of children: 0  . Years of education: Not on file  . Highest  education level: Not on file  Occupational History  . Occupation: Therapist, occupational: [employer]  Tobacco Use  . Smoking status: Never Smoker  . Smokeless tobacco: Never Used  Vaping Use  . Vaping Use: Never used  Substance and Sexual Activity  . Alcohol use: Yes    Comment: Alcohol once every few weeks/months  . Drug use: No  . Sexual activity: Not on file  Other Topics Concern  . Not on file  Social History Narrative   Lives at home with spouse.    Social Determinants of Health   Financial Resource Strain: Not on file  Food Insecurity: Not on file   Transportation Needs: Not on file  Physical Activity: Not on file  Stress: Not on file  Social Connections: Not on file     Family History: The patient's family history includes Gout in his father; Heart attack (age of onset: 30) in his father; Hypertension in his mother; Stroke in his mother. There is no history of Colon cancer, Esophageal cancer, Rectal cancer, or Stomach cancer.  ROS:   Please see the history of present illness.     All other systems reviewed and are negative.  EKGs/Labs/Other Studies Reviewed:    The following studies were reviewed today: Cardiac cath 02/2020  Conclusions: 1. No angiographically significant coronary artery disease. 2. Normal left ventricular contraction with mildly elevated filling pressures consistent with diastolic dysfunction.  Recommendations: 1. Primary prevention of coronary artery disease. 2. Continue blood pressure control as well as recently added metoprolol for management of incidentally noted PVCs.  If symptoms persist, ambulatory cardiac monitoring +/- echcardiography will need to be considered.  Nelva Bush, MD [facility]  Coronary Diagrams   Diagnostic Dominance: Right        Heart monitor 2022   The patient was monitored for 14 days.  The predominant rhythm was sinus with an average rate of 85 bpm (range 47 - 122 bpm in sinus).  There were rare PAC's and occasional PVC's.  There were 6 atrial runs lasting up to 10.8 seconds with a maximum rate of 154 bpm.  There were 9 episodes of wide complex tachycardia lasting up to 8 beats with a maximum rate of 231 bpm. Episodes most likely represent NSVT, though some tracings could also reflect SVT with aberrancy.  No sustained arrhythmia or prolonged pause was identified.  Patient triggered events correspond to sinus rhythm with PVC's.   Sinus rhythm with rare PAC's and occasional PVC's.  Brief runs of PSVT and NSVT noted, as detailed above.   EKG:   EKG is not ordered today.   Recent Labs: 10/10/2019: ALT 42 03/14/2020: BUN 11; Creatinine, Ser 0.88; Potassium 4.2; Sodium 139; TSH 5.400 03/16/2020: Hemoglobin 15.5; Platelets 269  Recent Lipid Panel    Component Value Date/Time   CHOL 157 10/07/2016 0000   CHOL 152 08/18/2014 1121   TRIG 55 10/07/2016 0000   HDL 48 10/07/2016 0000   HDL 54 08/18/2014 1121   CHOLHDL 2.8 08/18/2014 1121   LDLCALC 98 10/07/2016 0000   LDLCALC 88 08/18/2014 1121     Physical Exam:    VS:  BP (!) 150/102   Pulse 72   Ht 5' 8.25" (1.734 m)   Wt 210 lb (95.3 kg)   BMI 31.70 kg/m     Wt Readings from Last 3 Encounters:  05/11/20 210 lb (95.3 kg)  03/30/20 214 lb (97.1 kg)  03/20/20 212 lb 1.3 oz (96.2 kg)  GEN:  Well nourished, well developed in no acute distress HEENT: Normal NECK: No JVD; No carotid bruits LYMPHATICS: No lymphadenopathy CARDIAC: RRR, no murmurs, rubs, gallops RESPIRATORY:  Clear to auscultation without rales, wheezing or rhonchi  ABDOMEN: Soft, non-tender, non-distended MUSCULOSKELETAL:  No edema; No deformity  SKIN: Warm and dry NEUROLOGIC:  Alert and oriented x 3 PSYCHIATRIC:  Normal affect   ASSESSMENT:    1. PVC's (premature ventricular contractions)   2. Palpitations   3. Essential hypertension    PLAN:    In order of problems listed above:  Palpitations PSVT/possible NSVT PACs/PVCs Patient still having occasional palpitations, seems to be worse with stress. Deep breathing helps. He has been staying away from heavy cardio since that seemed to make them worse as well. Heart monitor with SR, rare PACs, occasional PVCs, brief PSVT and NSVT, triggered events were related to PVCs. BP elevated today. No chest pain or sob. I will increase Toprol-XL to 100mg  daily for better pressure control and suppression of ectopy/arrhythmia. OK to try heavy cardio and monitor symptoms. We will see him back in 2 weeks.  HTN Elevated again today. Increase BB as above.  Continue Zestoric.  Disposition: Follow up in 2 week(s) with APP/MD    Signed, Abu Heavin Ninfa Meeker, PA-C  05/11/2020 3:54 PM    [facility]

## 2020-05-11 NOTE — Patient Instructions (Signed)
Medication Instructions:  Your physician has recommended you make the following change in your medication:   INCREASE Metoprolol to 100 mg daily. An Rx has been sent to your pharmacy.  *If you need a refill on your cardiac medications before your next appointment, please call your pharmacy*   Lab Work: None ordered If you have labs (blood work) drawn today and your tests are completely normal, you will receive your results only by: Marland Kitchen MyChart Message (if you have MyChart) OR . A paper copy in the mail If you have any lab test that is abnormal or we need to change your treatment, we will call you to review the results.   Testing/Procedures: None ordered   Follow-Up: At [facility], you and your health needs are our priority.  As part of our continuing mission to provide you with exceptional heart care, we have created designated Provider Care Teams.  These Care Teams include your primary Cardiologist (physician) and Advanced Practice Providers (APPs -  Physician Assistants and Nurse Practitioners) who all work together to provide you with the care you need, when you need it.  We recommend signing up for the patient portal called "MyChart".  Sign up information is provided on this After Visit Summary.  MyChart is used to connect with patients for Virtual Visits (Telemedicine).  Patients are able to view lab/test results, encounter notes, upcoming appointments, etc.  Non-urgent messages can be sent to your provider as well.   To learn more about what you can do with MyChart, go to [email or web address].    Your next appointment:   2 week(s)  The format for your next appointment:   In Person  Provider:   You may see Nelva Bush, MD or one of the following Advanced Practice Providers on your designated Care Team:    Murray Hodgkins, NP  Christell Faith, PA-C  Marrianne Mood, PA-C  Cadence [place], [place]  Laurann Montana, NP    Other Instructions N/A

## 2020-05-28 ENCOUNTER — Encounter: Payer: Self-pay | Admitting: Physician Assistant

## 2020-05-28 ENCOUNTER — Other Ambulatory Visit: Payer: Self-pay

## 2020-05-28 ENCOUNTER — Other Ambulatory Visit
Admission: RE | Admit: 2020-05-28 | Discharge: 2020-05-28 | Disposition: A | Discharge status: Home / Self Care | Payer: BC Managed Care – PPO | Source: Ambulatory Visit | Attending: Physician Assistant | Admitting: Physician Assistant

## 2020-05-28 ENCOUNTER — Ambulatory Visit (INDEPENDENT_AMBULATORY_CARE_PROVIDER_SITE_OTHER): Payer: BC Managed Care – PPO | Admitting: Physician Assistant

## 2020-05-28 VITALS — BP 142/100 | HR 82 | Ht 69.0 in | Wt 214.0 lb

## 2020-05-28 DIAGNOSIS — I10 Essential (primary) hypertension: Secondary | ICD-10-CM

## 2020-05-28 DIAGNOSIS — I471 Supraventricular tachycardia, unspecified: Secondary | ICD-10-CM

## 2020-05-28 DIAGNOSIS — R002 Palpitations: Secondary | ICD-10-CM

## 2020-05-28 DIAGNOSIS — R011 Cardiac murmur, unspecified: Secondary | ICD-10-CM

## 2020-05-28 DIAGNOSIS — I493 Ventricular premature depolarization: Secondary | ICD-10-CM | POA: Insufficient documentation

## 2020-05-28 DIAGNOSIS — I5189 Other ill-defined heart diseases: Secondary | ICD-10-CM

## 2020-05-28 LAB — BASIC METABOLIC PANEL
Anion gap: 7 (ref 5–15)
BUN: 11 mg/dL (ref 8–23)
CO2: 28 mmol/L (ref 22–32)
Calcium: 9.2 mg/dL (ref 8.9–10.3)
Chloride: 103 mmol/L (ref 98–111)
Creatinine, Ser: 0.91 mg/dL (ref 0.61–1.24)
GFR, Estimated: >60 mL/min (ref >60)
Glucose, Bld: 103 mg/dL — ABNORMAL HIGH (ref 70–99)
Potassium: 3.4 mmol/L — ABNORMAL LOW (ref 3.5–5.1)
Sodium: 138 mmol/L (ref 135–145)

## 2020-05-28 LAB — BRAIN NATRIURETIC PEPTIDE: B Natriuretic Peptide: 211.9 pg/mL — ABNORMAL HIGH (ref 0.0–100.0)

## 2020-05-28 LAB — MAGNESIUM: Magnesium: 2.4 mg/dL (ref 1.7–2.4)

## 2020-05-28 MED ORDER — TORSEMIDE 10 MG PO TABS: 10.0000 mg | ORAL_TABLET | Freq: Every day | ORAL | 3 refills | Status: DC

## 2020-05-28 MED ORDER — LISINOPRIL 20 MG PO TABS: 20.0000 mg | ORAL_TABLET | Freq: Every day | ORAL | 3 refills | Status: DC

## 2020-05-28 NOTE — Patient Instructions (Addendum)
Medication Instructions:  Your physician has recommended you make the following change in your medication:   STOP Taking Lisinopril/ HCTZ   START Lisinopril 20mg  DAILY  START Torsemide 10mg  DAILY  *If you need a refill on your cardiac medications before your next appointment, please call your pharmacy*   Lab Work: Your physician recommends that you have lab work TODAY at the [place] [facility]: BMET, Magnesium, BNP -  Please go to the [facility]. You will check in at the front desk to the right as you walk into the atrium.    If you have labs (blood work) drawn today and your tests are completely normal, you will receive your results only by: Marland Kitchen MyChart Message (if you have MyChart) OR . A paper copy in the mail If you have any lab test that is abnormal or we need to change your treatment, we will call you to review the results.   Testing/Procedures: None ordered   Follow-Up: At [facility], you and your health needs are our priority.  As part of our continuing mission to provide you with exceptional heart care, we have created designated Provider Care Teams.  These Care Teams include your primary Cardiologist (physician) and Advanced Practice Providers (APPs -  Physician Assistants and Nurse Practitioners) who all work together to provide you with the care you need, when you need it.  We recommend signing up for the patient portal called "MyChart".  Sign up information is provided on this After Visit Summary.  MyChart is used to connect with patients for Virtual Visits (Telemedicine).  Patients are able to view lab/test results, encounter notes, upcoming appointments, etc.  Non-urgent messages can be sent to your provider as well.   To learn more about what you can do with MyChart, go to [email or web address].    Your next appointment:    Follow up in 2 weeks (We will call you to schedule an appointment for the end of the day - 4:00 PM)  The format for your next  appointment:   In Person  Provider:   You may see Nelva Bush, MD or one of the following Advanced Practice Providers on your designated Care Team:     Marrianne Mood, [place]     Other Instructions  Ask your wife about your snoring and if you ever stop breathing at night.  If interested, we can set you up with a Watch Pat: WatchPAT?  Is a FDA cleared portable home sleep study test that uses a watch and 3 points of contact to monitor 7 different channels, including your heart rate, oxygen saturations, body position, snoring, and chest motion.  The study is easy to use from the comfort of your own home and accurately detect sleep apnea.  Before bed, you attach the chest sensor, attached the sleep apnea bracelet to your nondominant hand, and attach the finger probe.  After the study, the raw data is downloaded from the watch and scored for apnea events.   For more information: [email or web address]   Salt and Fluid:  We recommend a maximum of 2g sodium per day and 2L total fluid per day. Fluids include coffee, tea, water, and juice.  In addition, we recommend you monitor both your daily weight and daily BP at the same time each day - bring this long into the office.   Please follow these special instructions for your heart:  1. Follow a low-salt diet - you are allowed no more than 2,000mg  of sodium per  day. Watch your fluid intake. In general, you should not be taking in more than 2 liters of fluid per day (no more than 8 glasses per day). This includes sources of water in foods like soup, coffee, tea, milk, etc. 2. Weigh yourself on the same scale at same time of day and keep a log. 3. Call your doctor: (Anytime you feel any of the following symptoms)  - 3lb weight gain overnight or 5lb within a few days - Shortness of breath, with or without a dry hacking cough  - Swelling in the hands, feet or stomach  - If you have to sleep on extra pillows at night in order to  breathe   IT IS IMPORTANT TO LET YOUR DOCTOR KNOW EARLY ON IF YOU ARE HAVING SYMPTOMS SO WE CAN HELP YOU!  Blood pressure: --We recommend upper arm BP cuffs over that of the wrist. --You can always check your device's accuracy against an office model once a year if possible. You can do so by bringing your cuff into the office and notifying the person that rooms you that you would like to check your cuff against our office readings. --Measure your BP at the same time each day. Blood pressure varies often throughout the day with higher readings in the morning. BP may also be lower at home than in the office. Do not measure BP right after you wake up or after exercising. Avoid caffeine, tobacco, and alcohol for 30 minutes before taking a measurement.  --Sit quietly for five minutes in a comfortable position with your legs and ankles uncrossed and back supported. Feet flat on the ground. Have your arm supported and at the level of your heart. Always use the same arm when taking your blood pressure. Place the cuff over bare skin rather than clothing. Each time you measure, take an additional reading if abnormal to ensure accurate by waiting 1-3 minutes after the first reading. --Please bring a BP log into the office. It is helpful to document the time of each BP reading, as well as any activity or medications taken around the reading. In addition, it is helpful to include heart rate at the time of the BP reading. Daily weights are also encouraged. --Goal BP is 130/80 or lower. If your BP is low but you are not dizzy, this is fine and preferred over BP higher than 130/80. If your BP is less than 100 for the top number and you are dizzy, call the office. If your blood pressure is consistently elevated with top number above 180 and bottom above 120, this can damage the body. If you have severe increase in your blood pressure or concerning symptoms of severe chest pain, headache with confusion and blurred vision,  severe abdominal or back pain, shortness of breath, seizures, or loss of consciousness, go to the emergency department.

## 2020-05-28 NOTE — Progress Notes (Signed)
Office Visit    Patient Name: Jason [name] Date of Encounter: 05/28/2020  PCP:  Venia Carbon, MD   [place]  Cardiologist:  Nelva Bush, MD  Advanced Practice Provider:  No care team member to display Electrophysiologist:  None   Chief Complaint    Chief Complaint  Patient presents with  . Follow-up    2 Weeks follow up and c/o rapid heartbeat.  Medications verbally reviewed with patient.     65 year old male with history of hypertension, thyroid disease, GERD, osteoarthritis, and seen today for 2 week follow-up.  Past Medical History    Past Medical History:  Diagnosis Date  . Allergy   . Arthritis    osteoarthritis  . GERD (gastroesophageal reflux disease)   . Hypertension   . Thyroid disease    Hyperthyroidism s/p radioactive iodine ablation   Past Surgical History:  Procedure Laterality Date  . CARDIAC CATHETERIZATION  03-2000  . LEFT HEART CATH AND CORONARY ANGIOGRAPHY Left 03/20/2020   Procedure: LEFT HEART CATH AND CORONARY ANGIOGRAPHY;  Surgeon: Nelva Bush, MD;  Location: [facility];  Service: Cardiovascular;  Laterality: Left;  . ROTATOR CUFF REPAIR  10/12   Dr [name] Heck  . TONSILLECTOMY    . 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 TOTAL HIP ARTHROPLASTY Left 06/2016   Dr Harlow Mares    Allergies  No Known Allergies  History of Present Illness    Jason [name] is a 65 y.o. male with PMH as above.    He was seen in the hospital 02/2020 after feeling chest fluttering following exercise at the [facility] [employer] gym on 2 separate occasions.  After his first episode of chest fluttering, he presented to the ED with work-up largely unremarkable, and he was sent home with diagnosis of PVCs and panic attack.  He returned to [facility] a second time after a recurrent episode of fluttering, resolving with deeper breathing.  He was admitted and seen in the hospital by [facility] cardiology, at which time he reported occasional  lightheadedness on separate occasions.  During consultation, it was noted there was concern his episodes may reflect coronary insufficiency given their association with exercises and ST/T changes on EKG.    Outpatient catheterization was recommended.  He was started on ASA and metoprolol 25 mg daily.  Nitroglycerin was deferred in the setting of using sildenafil.  Diagnostic/expedited LHC 03/20/2020 was without significant CAD and showed mildly elevated LVEDP, diastolic dysfunction.  Primary prevention of CAD was recommended.  He was continued on metoprolol for PVCs.  Cardiac monitoring and echo were recommended.  He was seen at follow-up 03/30/2020 and had started work at [employer].  He reported recurrent palpitations.  His most recent episode had occurred the previous morning while trying to catch his cat.  Most of the episodes were precipitated by exertion.  His episodes continue to improve with deeper breathing.  BP 158/100.  He was noted to take his blood pressure once a week on Saturdays.  He reported home BP usually 130s over 80s.  He was working to decrease salt and caffeine intake.  Increase of beta-blocker was discussed with patient preference to first try lifestyle changes.    2-week cardiac monitoring was ordered and showed NSR, rare PVCs/PACs, NSVT, PSVT.  At follow-up to review the monitor, BP noted to be elevated again.  At follow-up 05/11/2020, Toprol was increased to 100 mg daily for better BP control and suppression of ectopy.  Today, he  returns to clinic, and he notes he is doing about the same as previous clinic visits.  The exception is yesterday, when he forgot to take his AM pills.  At that time, he experienced a pounding heartbeat, racing HR, and skipped heart beats.  BP 172/96 when he did not take his BP pills.  After taking BP pills, BP 141/87.  No CP.  No dyspnea/shortness of breath. Then, this morning, he became worked up and felt anxious.  He noted that this improved with deeper  breathing.  He wonders today about some upper epigastric pain that he has been feeling.  The upper epigastric pain is central and brief in nature without clear triggers.  He denies any clear association with food.  He does report frequent bowel movements, at least 2-3  in the morning.  He reports chronic lower extremity edema with right usually greater than that of left, though inverted on exam as below.  He denies orthopnea, PND.  He does note early satiety and weight gain.  BP today elevated at 142/100 with repeat BP 152/100.  He reports eating saltines and soup immediately before his visit.  We reviewed salt and fluid restrictions.  He denies any caffeine.  He does report some symptoms consistent with sleep apnea with his wife reporting that he does snore at night.  Discussed Watch Pat with patient preference to defer for now.  Home Medications    Current Outpatient Medications on File Prior to Visit  Medication Sig Dispense Refill  . acetaminophen (TYLENOL) 325 MG tablet Take 650 mg by mouth 2 (two) times daily as needed.    . Ascorbic Acid (VITA-C PO) Take 1,000 mg by mouth daily.    Marland Kitchen CRANBERRY PO Take 1 tablet by mouth daily.    . cyclobenzaprine (FLEXERIL) 10 MG tablet TAKE 1 TABLET BY MOUTH EVERY DAY AT NIGHT AS NEEDED 90 tablet 1  . levothyroxine (SYNTHROID) 88 MCG tablet Take 1 tablet (88 mcg total) by mouth daily. 90 tablet 3  . lisinopril-hydrochlorothiazide (ZESTORETIC) 20-25 MG tablet Take 1 tablet by mouth once daily 90 tablet 3  . loratadine (CLARITIN) 10 MG tablet Take 10 mg by mouth daily. Can take 2 tabs if needed    . metoprolol succinate (TOPROL XL) 100 MG 24 hr tablet Take 1 tablet (100 mg total) by mouth daily. Take with or immediately following a meal. 90 tablet 1  . Multiple Vitamin (MULTIVITAMIN) tablet Take 1 tablet by mouth daily.    . sildenafil (REVATIO) 20 MG tablet Take 3-5 tablets (60-100 mg total) by mouth daily as needed. 50 tablet 11  . tamsulosin (FLOMAX) 0.4 MG  CAPS capsule TAKE 1 CAPSULE BY MOUTH EVERY DAY 90 capsule 3   No current facility-administered medications on file prior to visit.    Review of Systems    He reports early satiety, upper epigastric discomfort that is transient without clear triggers and not associated with eating, early satiety, tachypalpitations that are more prevalent with elevated BP and resolve with deeper breathing. He reports asymmetric edema and frequent bowel movements. He denies chest pain, dyspnea, pnd, orthopnea, n, v, dizziness, syncope, weight gain.   All other systems reviewed and are otherwise negative except as noted above.  Physical Exam    VS:  BP (!) 142/100 (BP Location: Left Arm, Patient Position: Sitting, Cuff Size: Normal)   Pulse 82   Ht 5\' 9"  (1.753 m)   Wt 214 lb (97.1 kg)   SpO2 98%   BMI  31.60 kg/m  , BMI Body mass index is 31.6 kg/m. GEN: Well nourished, well developed, in no acute distress.  Mask in place. HEENT: normal. Neck: Supple.no carotid bruits, or masses.  JVD difficult to assess due to body habitus. Cardiac: RRR, 1/6 systolic murmur, rubs, or gallops. No clubbing, cyanosis, moderate to 1+ bilateral lower extremity edema with L>R (reports chronic asymmetric edema per pt report).  Radials/DP/PT 2+ and equal bilaterally.  Respiratory: Bibasilar reduced breath sounds, worse at left base.Marland Kitchen GI: Soft, nontender, nondistended, BS + x 4. MS: no deformity or atrophy. Skin: warm and dry, no rash. Neuro:  Strength and sensation are intact. Psych: Normal affect.  Accessory Clinical Findings    ECG personally reviewed by me today -NSR, 75 bpm, PR interval 164, QRS 96, poor R wave progression in leads III, LVH, no acute changes- no acute changes.  VITALS Reviewed today   Temp Readings from Last 3 Encounters:  03/20/20 98.9 F (37.2 C) (Oral)  03/14/20 98 F (36.7 C) (Temporal)  01/31/20 97.6 F (36.4 C) (Oral)   BP Readings from Last 3 Encounters:  05/28/20 (!) 142/100  05/11/20  (!) 150/102  03/30/20 (!) 158/100   Pulse Readings from Last 3 Encounters:  05/28/20 82  05/11/20 72  03/30/20 84    Wt Readings from Last 3 Encounters:  05/28/20 214 lb (97.1 kg)  05/11/20 210 lb (95.3 kg)  03/30/20 214 lb (97.1 kg)     LABS  reviewed today   Lab Results  Component Value Date   WBC 7.6 03/16/2020   HGB 15.5 03/16/2020   HCT 45.9 03/16/2020   MCV 92.4 03/16/2020   PLT 269 03/16/2020   Lab Results  Component Value Date   CREATININE 0.88 03/14/2020   BUN 11 03/14/2020   NA 139 03/14/2020   K 4.2 03/14/2020   CL 98 03/14/2020   CO2 28 03/14/2020   Lab Results  Component Value Date   ALT 42 10/10/2019   AST 60 (H) 10/10/2019   ALKPHOS 74 10/10/2019   BILITOT 0.7 10/10/2019   Lab Results  Component Value Date   CHOL 157 10/07/2016   HDL 48 10/07/2016   LDLCALC 98 10/07/2016   TRIG 55 10/07/2016   CHOLHDL 2.8 08/18/2014    No results found for: HGBA1C Lab Results  Component Value Date   TSH 5.400 (H) 03/14/2020     STUDIES/PROCEDURES reviewed today   Cardiovascular History & Procedures: Cardiovascular Problems:  Chest discomfort and palpitations  Risk Factors:  Hypertension, male gender, obesity, and age > 67  Cath/PCI:  LHC (04/23/2000): Normal coronary arteries.  Normal LVEF.  Tortuous iliac arteries.  CV Surgery:  None  EP Procedures and Devices:  None  Non-Invasive Evaluation(s):  None available   Cardiac cath 02/2020 Conclusions: 1. No angiographically significant coronary artery disease. 2. Normal left ventricular contraction with mildly elevated filling pressures consistent with diastolic dysfunction. Recommendations: 1. Primary prevention of coronary artery disease. 2. Continue blood pressure control as well as recently added metoprolol for management of incidentally noted PVCs. If symptoms persist, ambulatory cardiac monitoring +/- echcardiography will need to be considered. Coronary  Diagrams   Diagnostic Dominance: Right        Heart monitor 2022   The patient was monitored for 14 days.  The predominant rhythm was sinus with an average rate of 85 bpm (range 47 - 122 bpm in sinus).  There were rare PAC's and occasional PVC's.  There were 6 atrial runs lasting up  to 10.8 seconds with a maximum rate of 154 bpm.  There were 9 episodes of wide complex tachycardia lasting up to 8 beats with a maximum rate of 231 bpm. Episodes most likely represent NSVT, though some tracings could also reflect SVT with aberrancy.  No sustained arrhythmia or prolonged pause was identified.  Patient triggered events correspond to sinus rhythm with PVC's.  Sinus rhythm with rare PAC's and occasional PVC's. Brief runs of PSVT and NSVT noted, as detailed above.    Assessment & Plan    H/o chest pain  Upper epigastric pain --Reports transient upper epigastric pain without clear triggers and not associated with food.  Previous report of chest pain with subsequent LHC showing normal coronary arteries.  Considered GI etiology, despite no association with food, as well as his recent elevated blood pressure as directly below. EKG without acute ST/T changes. No further ischemic workup at this time. Consider discomfort 2/2 volume overload in the setting of high salt diet. Given bloating and early satiety,  we will switch from HCTZ to torsemide 10mg  daily as directly below. BMET today and 1 week. Recommend aggressive risk factor modification.  Continue current medications. Encouraged ongoing activity. Reviewed heart healthy diet. As below, consider echo at RTC.  HTN, poorly controlled, goal BP 130/80 or lower --Despite increase beta-blocker, BP continues to be elevated at 142/100 with repeat BP showing SBP 150s.  Reviewed recommendations for salt and fluid restrictions reviewed and provided in AVS.  He does report some element of anxiety, which is likely contributing.  Volume up  on exam today, despite ReDS vest 33%. Will order BNP, given ReDS vest does not agree with exam. Suspect salt and fluid as culprits, given his meal before presenting to clinic was saltines and soup.  He will work to decrease salt and fluid intake.  Detailed instructions regarding BP monitoring discussed today.  He has been maintained on lisinopril-HCTZ until now.  I discontinued this combo medication, so that I could separate out the HCTZ and discontinue it. I then restarted lisinopril 20 mg daily (but discontinued HCTZ) and added torsemide 10 mg daily.  This will better target his abdominal bloating and sx, as well as better control BP and volume status given LVEDP elevated by cath. Will obtain a BMET today and in 2 weeks.  Requested that he send a MyChart message within the next couple of days to update [place] on his BP readings, weights, heart rate, and symptoms.  He is agreeable. Also discussed that untreated OSA can contribute to elevated BP as below. As below, reassess echo at RTC.  Acute on chronic HFpEF --Volume up on exam with report of LEE and early satiety. BP elevated and wt elevated 4 lbs from 3/18 visit. ReDS vest performed and 33%; however, given exam consistent with volume overload, I will start torsemide 10mg  today as above and discontinue the HCTZ in his combination HCTZ /lisinopril medication. Will restart lisinopril at same dose to take along with torsemide. Will order BNP to further evaluate his volume status. BMET today and in 2 weeks. Reassess volume status at RTC. Consider repeat echo at that time.  Systolic murmur ---Systolic murmur heard on exam with recommendation to repeat echo if murmur still appreciated at RTC in 2 weeks and once improved volume status and BP. Will discuss again at RTC.  Tachypalpitations -Reports tachypalpitations in the setting of elevated BP, worsening when he forgets to take his medication. Sx alleviate with deeper breathing  TSH was elevated on labs  with  further work-up per PCP. Suspect anxiety also plays a role, as well as contributes to elevated BP. Recent monitor as above. With ongoing sx, could consider diltiazem if EF confirmed as nl (55-65% by cath). Will defer for now, as suspect these sx will improve with improved volume status. Will recheck BMET to confirm electrolytes. Consider echo at RTC.  Snoring --Watch pat discussed. He will have his wife monitor for snoring and if he stops breathing in his sleep. He will try to pay better attention to any morning HA or associated sx reviewed today. Will defer Watch Pat for now per pt and reassess at RTC.   Medication changes: Stop lisinopril-HCTZ.  Restart lisinopril at same dose. Discontinue HCTZ. Start torsemide 10 mg daily. Labs ordered: BNP, BMET today and in 2 weeks. Studies / Imaging ordered: None Future considerations: Watch Pat deferred per pt, echo deferred and will be reassessed once diuresed. Trend ReDS, BNP. Disposition: RTC 2 weeks     Arvil Chaco, PA-C 05/28/2020

## 2020-05-30 ENCOUNTER — Telehealth: Payer: Self-pay | Admitting: *Deleted

## 2020-05-30 NOTE — Telephone Encounter (Signed)
-----   Message from Arvil Chaco, PA-C sent at 05/28/2020 10:57 PM EDT ----- Labs show --Stable renal function before start of torsemide --Potassium low at 3.4, which can contribute to palpitations --Magnesium at goal and on the higher side of normal range  Recommendations --Continue medication changes discussed in clinic. --Start KCL tab 92mEq x 3 days, followed by KCL tab 19mEq daily thereafter to prevent low potassium going forward on torsemide.   --No indication for magnesium supplement (we had discussed this today during his visit, as he asked about starting a Mg supplement) --As discussed, we will repeat a BMET to recheck his kidneys and electrolytes in 1-2 weeks or at follow-up.

## 2020-05-30 NOTE — Telephone Encounter (Signed)
Attempted to call pt to review lab results and provider's recc.  No answer. Lmtcb.

## 2020-05-31 NOTE — Telephone Encounter (Signed)
Attempted to call pt back. No answer. Lmtcb.  

## 2020-05-31 NOTE — Telephone Encounter (Signed)
Patient calling back. States he will not be available until 4 pm 4/8

## 2020-05-31 NOTE — Telephone Encounter (Signed)
Patient returning call.

## 2020-06-01 MED ORDER — POTASSIUM CHLORIDE ER 10 MEQ PO TBCR: 10.0000 meq | EXTENDED_RELEASE_TABLET | Freq: Every day | ORAL | 3 refills | Status: DC

## 2020-06-01 MED ORDER — POTASSIUM CHLORIDE ER 10 MEQ PO TBCR: EXTENDED_RELEASE_TABLET | ORAL | 0 refills | Status: DC

## 2020-06-01 NOTE — Telephone Encounter (Signed)
Called pt, spoke to wife, Jason [name] (ok per DPR), notified of results and provider's recc.  Jason [name] verbalized understanding. Does state that pt takes a Magnesium supplement, but she is unsure dose.  Will update med list for supplement. Per Jason [name], ok to continue at this time as still within normal range.  Pt will start Potassium 17mEq x 3 days then Potassium 4mEq daily thereafter.  Confirmed follow up appointment with Jason [name] 4/20 at [place] and notified will repeat BMET at this visit.  Rx sent to [facility] in [place]. Pt's wife has no further questions at this time.

## 2020-06-13 ENCOUNTER — Ambulatory Visit (INDEPENDENT_AMBULATORY_CARE_PROVIDER_SITE_OTHER): Payer: BC Managed Care – PPO | Admitting: Physician Assistant

## 2020-06-13 ENCOUNTER — Other Ambulatory Visit: Payer: Self-pay

## 2020-06-13 ENCOUNTER — Encounter: Payer: Self-pay | Admitting: Physician Assistant

## 2020-06-13 VITALS — BP 120/86 | HR 77 | Ht 69.0 in | Wt 204.0 lb

## 2020-06-13 DIAGNOSIS — I5189 Other ill-defined heart diseases: Secondary | ICD-10-CM

## 2020-06-13 DIAGNOSIS — R011 Cardiac murmur, unspecified: Secondary | ICD-10-CM | POA: Diagnosis not present

## 2020-06-13 DIAGNOSIS — I471 Supraventricular tachycardia: Secondary | ICD-10-CM

## 2020-06-13 DIAGNOSIS — Z87898 Personal history of other specified conditions: Secondary | ICD-10-CM

## 2020-06-13 DIAGNOSIS — R002 Palpitations: Secondary | ICD-10-CM

## 2020-06-13 DIAGNOSIS — I493 Ventricular premature depolarization: Secondary | ICD-10-CM | POA: Diagnosis not present

## 2020-06-13 DIAGNOSIS — I10 Essential (primary) hypertension: Secondary | ICD-10-CM

## 2020-06-13 DIAGNOSIS — K219 Gastro-esophageal reflux disease without esophagitis: Secondary | ICD-10-CM

## 2020-06-13 NOTE — Patient Instructions (Addendum)
Medication Instructions:  No changes to your medication, please continue your regular daily meds  *If you need a refill on your cardiac medications before your next appointment, please call your pharmacy*   Lab Work: None  Testing/Procedures: Your physician has requested that you have an echocardiogram.   Echocardiography is a painless test that uses sound waves to create images of your heart. It provides your doctor with information about the size and shape of your heart and how well your heart's chambers and valves are working. This procedure takes approximately one hour. There are no restrictions for this procedure.  There is a possibility that an IV may need to be started during your test to inject an image enhancing agent. This is done to obtain more optimal pictures of your heart. Therefore we ask that you do at least drink some water prior to coming in to hydrate your veins.    Follow-Up:  Your next appointment:   Follow-up after your echocardiogram is completed to review results   The format for your next appointment:   In Person  Provider:   Nelva Bush, MD or Marrianne Mood, PA-C

## 2020-06-13 NOTE — Progress Notes (Signed)
Office Visit    Patient Name: Jason [name] Date of Encounter: 06/13/2020  PCP:  Venia Carbon, MD   [place]  Cardiologist:  Nelva Bush, MD  Advanced Practice Provider:  No care team member to display Electrophysiologist:  None   Chief Complaint    Chief Complaint  Patient presents with  . Other    2 week follow up. Meds reviewed verbally with patient.     65 year old male with history of hypertension, thyroid disease, GERD, osteoarthritis, and seen today for 2 week follow-up.  Past Medical History    Past Medical History:  Diagnosis Date  . Allergy   . Arthritis    osteoarthritis  . GERD (gastroesophageal reflux disease)   . Hypertension   . Thyroid disease    Hyperthyroidism s/p radioactive iodine ablation   Past Surgical History:  Procedure Laterality Date  . CARDIAC CATHETERIZATION  03-2000  . LEFT HEART CATH AND CORONARY ANGIOGRAPHY Left 03/20/2020   Procedure: LEFT HEART CATH AND CORONARY ANGIOGRAPHY;  Surgeon: Nelva Bush, MD;  Location: [facility];  Service: Cardiovascular;  Laterality: Left;  . ROTATOR CUFF REPAIR  10/12   Dr [name] Heck  . TONSILLECTOMY    . 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 TOTAL HIP ARTHROPLASTY Left 06/2016   Dr Harlow Mares    Allergies  No Known Allergies  History of Present Illness    Jason [name] is a 65 y.o. male with PMH as above.    He was seen in the hospital 02/2020 after feeling chest fluttering following exercise at the [facility] gym on 2 separate occasions.  After his first episode of chest fluttering, he presented to the ED with work-up largely unremarkable, and he was sent home with diagnosis of PVCs and panic attack.  He returned to [facility] a second time after a recurrent episode of fluttering, resolving with deeper breathing.  He was admitted and seen in the hospital by [facility] cardiology, at which time he reported occasional lightheadedness on separate occasions.   During consultation, it was noted there was concern his episodes may reflect coronary insufficiency given their association with exercises and ST/T changes on EKG.    Outpatient catheterization was recommended.  He was started on ASA and metoprolol 25 mg daily.  Nitroglycerin was deferred in the setting of using sildenafil.  Diagnostic/expedited LHC 03/20/2020 was without significant CAD and showed mildly elevated LVEDP, diastolic dysfunction.  Primary prevention of CAD was recommended.  He was continued on metoprolol for PVCs.  Cardiac monitoring and echo were recommended.  He was seen at follow-up 03/30/2020 and had started work at [employer].  He reported recurrent palpitations.  His most recent episode had occurred the previous morning while trying to catch his cat.  Most of the episodes were precipitated by exertion.  His episodes continue to improve with deeper breathing.  BP 158/100.  He was noted to take his blood pressure once a week on Saturdays.  He reported home BP usually 130s over 80s.  He was working to decrease salt and caffeine intake.  Increase of beta-blocker was discussed with patient preference to first try lifestyle changes.    2-week cardiac monitoring was ordered and showed NSR, rare PVCs/PACs, NSVT, PSVT.  At follow-up to review the monitor, BP noted to be elevated again.  At follow-up 05/11/2020, Toprol was increased to 100 mg daily for better BP control and suppression of ectopy.  Seen 05/28/2020 and reportedly doing the same  as previous clinic visits.  He had forgotten to take his AM pills the previous morning with BP elevated.  At that time, he experienced a pounding heart rate/racing heart rate and skipped heartbeats.  BP 172/96.  After taking BP pills, BP 141/87.  No associated chest pain, dyspnea, shortness of breath.  On the way into clinic that day, he reported anxiety that improved with deeper breathing.  He also noted epigastric pain that was brief in nature without clear  triggers.  No clear association with food.  He reported frequent bowel movements.  He also had chronic lower extremity edema with right greater than that of left.  He noted early satiety and weight gain.  BP 142/100 with repeat BP 152/100.  He reported eating saltines and soup immediately before his visit.  Recommendation was to initiate torsemide 10 mg daily, as it was felt that he was volume overloaded on exam.  HCTZ was discontinued to avoid electrolyte abnormalities.  Labs obtained as below.  No caffeine use reported.  He did report some signs or symptoms consistent with sleep apnea with wife reporting that he snores at night.  Watch pat discussed with patient preference to defer at that time.  Today, 06/13/2020, he returns to clinic and notes improvement in his overall volume status.  No recent chest pain.  BP today significantly improved from that of previous visits and 120/86.  He reports some concern over recent information/comments made by neighbor that metoprolol is also a diuretic.  We clarified today that metoprolol is for heart rate and blood pressure control and not a diuretic with patient relief and understanding.  He has been watching his salt intake much more closely and was surprised at how much salt is in all foods that he reads the label for recently.  He states that he did not realize how much fluid he was consuming on a daily basis before we reviewed salt and fluid restrictions.  He feels he was consuming greater than 4 L/day of just water.  He has attempted to cut back, though still notes likely around 3 L of water or just below consumed per day.  He states that he otherwise feels as if he has a dry mouth with discussion regarding future PCP labs or DM2 labs if needed.  BP and weight log reviewed with BP 150/101 at max and 103/60 minimum.  Weight 206.  He reports that his wife has been monitoring his sleeping after our last clinic visit and denies that he snores consistently.  She reports he  does not stop breathing at night.  He reportedly only snores when he has a head cold or is congested.  He reports intermittent racing heart rate at different times but no clear triggers.  He does have a regular workout routine and denies any exertional chest pain or shortness of breath.  No PND, orthopnea, early satiety noted.  Weight today 2 4 pounds, decreased from previous clinic visit up to 14 pounds.  Overall, he is satisfied with the medication changes made at his previous clinic visit.  Home Medications    Current Outpatient Medications on File Prior to Visit  Medication Sig Dispense Refill  . acetaminophen (TYLENOL) 325 MG tablet Take 650 mg by mouth 2 (two) times daily as needed.    . Ascorbic Acid (VITA-C PO) Take 1,000 mg by mouth daily.    Marland Kitchen CRANBERRY PO Take 1 tablet by mouth daily.    . cyclobenzaprine (FLEXERIL) 10 MG tablet TAKE 1  TABLET BY MOUTH EVERY DAY AT NIGHT AS NEEDED 90 tablet 1  . levothyroxine (SYNTHROID) 88 MCG tablet Take 1 tablet (88 mcg total) by mouth daily. 90 tablet 3  . lisinopril (ZESTRIL) 20 MG tablet Take 1 tablet (20 mg total) by mouth daily. 30 tablet 3  . loratadine (CLARITIN) 10 MG tablet Take 10 mg by mouth daily. Can take 2 tabs if needed    . MAGNESIUM OXIDE PO Take by mouth daily. Pt unsure of dose    . metoprolol succinate (TOPROL XL) 100 MG 24 hr tablet Take 1 tablet (100 mg total) by mouth daily. Take with or immediately following a meal. 90 tablet 1  . Multiple Vitamin (MULTIVITAMIN) tablet Take 1 tablet by mouth daily.    Derrill Memo ON 07/01/2020] potassium chloride (KLOR-CON) 10 MEQ tablet Take 1 tablet (10 mEq total) by mouth daily. 30 tablet 3  . sildenafil (REVATIO) 20 MG tablet Take 3-5 tablets (60-100 mg total) by mouth daily as needed. 50 tablet 11  . tamsulosin (FLOMAX) 0.4 MG CAPS capsule TAKE 1 CAPSULE BY MOUTH EVERY DAY 90 capsule 3  . torsemide (DEMADEX) 10 MG tablet Take 1 tablet (10 mg total) by mouth daily. 30 tablet 3   No current  facility-administered medications on file prior to visit.    Review of Systems    He denies chest pain,  dyspnea, pnd, orthopnea, n, v, dizziness, syncope, weight gain, or further early satiety.  He reports stable/ongoing intermittent palpitations without clear triggers.  He reports ongoing asymmetric edema as noted in the past with right greater than left, though improved with fluid pill, as well as his weight and BP.  All other systems reviewed and are otherwise negative except as noted above.  Physical Exam    VS:  BP 120/86 (BP Location: Left Arm, Patient Position: Sitting, Cuff Size: Normal)   Pulse 77   Ht 5\' 9"  (1.753 m)   Wt 204 lb (92.5 kg)   BMI 30.13 kg/m  , BMI Body mass index is 30.13 kg/m. GEN: Well nourished, well developed, in no acute distress.  Mask in place. HEENT: normal. Neck: Supple.no carotid bruits, or masses.  JVD difficult to assess due to body habitus. Cardiac: RRR, 1/6 systolic murmur (faint, only appreciated with use of stethoscope microphone) .  No rubs, or gallops. No clubbing, cyanosis, moderate to 1+ bilateral lower extremity edema with R>L(reports chronic asymmetric edema per pt report).  Radials/DP/PT 2+ and equal bilaterally.  Respiratory: CTAB. GI: Soft, nontender, nondistended, BS + x 4. MS: no deformity or atrophy. Skin: warm and dry, no rash. Neuro:  Strength and sensation are intact. Psych: Normal affect.  Accessory Clinical Findings    ECG personally reviewed by me today -NSR, 77 bpm, PR interval 166, QRS 102 ms/IVCD, poor R wave progression in leads III/aVF, LVH with repolarization abnormalities- no acute changes.  VITALS Reviewed today   Temp Readings from Last 3 Encounters:  03/20/20 98.9 F (37.2 C) (Oral)  03/14/20 98 F (36.7 C) (Temporal)  01/31/20 97.6 F (36.4 C) (Oral)   BP Readings from Last 3 Encounters:  06/13/20 120/86  05/28/20 (!) 142/100  05/11/20 (!) 150/102   Pulse Readings from Last 3 Encounters:  06/13/20 77   05/28/20 82  05/11/20 72    Wt Readings from Last 3 Encounters:  06/13/20 204 lb (92.5 kg)  05/28/20 214 lb (97.1 kg)  05/11/20 210 lb (95.3 kg)     LABS  reviewed today  Lab Results  Component Value Date   WBC 7.6 03/16/2020   HGB 15.5 03/16/2020   HCT 45.9 03/16/2020   MCV 92.4 03/16/2020   PLT 269 03/16/2020   Lab Results  Component Value Date   CREATININE 0.91 05/28/2020   BUN 11 05/28/2020   NA 138 05/28/2020   K 3.4 (L) 05/28/2020   CL 103 05/28/2020   CO2 28 05/28/2020   Lab Results  Component Value Date   ALT 42 10/10/2019   AST 60 (H) 10/10/2019   ALKPHOS 74 10/10/2019   BILITOT 0.7 10/10/2019   Lab Results  Component Value Date   CHOL 157 10/07/2016   HDL 48 10/07/2016   LDLCALC 98 10/07/2016   TRIG 55 10/07/2016   CHOLHDL 2.8 08/18/2014    No results found for: HGBA1C Lab Results  Component Value Date   TSH 5.400 (H) 03/14/2020     STUDIES/PROCEDURES reviewed today   Cardiovascular History & Procedures: Cardiovascular Problems:  Chest discomfort and palpitations  Risk Factors:  Hypertension, male gender, obesity, and age > 29  Cath/PCI:  LHC (04/23/2000): Normal coronary arteries.  Normal LVEF.  Tortuous iliac arteries.  CV Surgery:  None  EP Procedures and Devices:  None  Non-Invasive Evaluation(s):  None available   Cardiac cath 02/2020 Conclusions: 1. No angiographically significant coronary artery disease. 2. Normal left ventricular contraction with mildly elevated filling pressures consistent with diastolic dysfunction. Recommendations: 1. Primary prevention of coronary artery disease. 2. Continue blood pressure control as well as recently added metoprolol for management of incidentally noted PVCs. If symptoms persist, ambulatory cardiac monitoring +/- echcardiography will need to be considered. Coronary Diagrams   Diagnostic Dominance: Right        Heart monitor 2022   The patient was  monitored for 14 days.  The predominant rhythm was sinus with an average rate of 85 bpm (range 47 - 122 bpm in sinus).  There were rare PAC's and occasional PVC's.  There were 6 atrial runs lasting up to 10.8 seconds with a maximum rate of 154 bpm.  There were 9 episodes of wide complex tachycardia lasting up to 8 beats with a maximum rate of 231 bpm. Episodes most likely represent NSVT, though some tracings could also reflect SVT with aberrancy.  No sustained arrhythmia or prolonged pause was identified.  Patient triggered events correspond to sinus rhythm with PVC's.  Sinus rhythm with rare PAC's and occasional PVC's. Brief runs of PSVT and NSVT noted, as detailed above.    Assessment & Plan    H/o chest pain  Upper epigastric pain, resolved -- No further reported chest pain or upper epigastric pain.  Upper epigastric pain has resolved with diuresis.  Previous 02/2020 LHC showing normal coronary arteries. EKG without acute ST/T changes. No further ischemic/ workup at this time. Recommend ongoing aggressive risk factor modification.  Continue current medications. Encouraged ongoing activity. Reviewed heart healthy diet.  HTN, goal BP 130/80 or lower --At last clinic visit, HCTZ discontinued and patient started on torsemide 10 mg daily, given elevated LVEDP elevated by cath.  Since initiation of torsemide, he has lost 10 pounds and BP has significantly improved with patient report of significantly improved symptoms.  As above in HPI, his wife does not suspect sleep apnea on listening to him sleep between clinic visits.  Ongoing salt and fluid restrictions reviewed with patient report that he has been cutting back on both fluid and salt since his last visit.  Continue current torsemide and lisinopril.  Chronic HFpEF -- Volume status significantly improved since initiation of torsemide 10mg  daily.  Continue with lisinopril.  He will continue to monitor weight, BP, and heart rate at  home.  Systolic murmur ---Systolic murmur heard on previous exam with recommendation to repeat echo if murmur still appreciated at RTC.  Today, systolic murmur very faint on exam.  We discussed that his murmur was likely exacerbated by elevated volume status with patient report that he has been told he has a murmur in the past.  Given his history of murmur appreciated on exams recently and in the past, obtain echo to assess severity of any valvular disease.  Tachypalpitations -Reports tachypalpitations with unclear triggers, unchanged since improved volume status. Recent monitor as above.  Given ongoing symptoms, if still symptomatic at RTC, could consider diltiazem if EF confirmed as nl on echo, ordered today (55-65% by cath).  For now, continue current beta-blocker.  H/o snoring (per wife's report) --Watch pat discussed at previous clinic visit, given his report that his wife had noticed snoring in the past.  On further discussion with his wife, she reports he only snores when congested or sick and denies that he stops breathing at night.  No plan for sleep study at this time.    Studies / Imaging ordered: Echo Future considerations:?  Diltiazem if tachypalpitations continue Disposition: RTC after echo     Arvil Chaco, PA-C 06/13/2020

## 2020-06-14 ENCOUNTER — Encounter: Payer: Self-pay | Admitting: Physician Assistant

## 2020-06-27 ENCOUNTER — Other Ambulatory Visit: Payer: Self-pay

## 2020-06-27 MED ORDER — POTASSIUM CHLORIDE ER 10 MEQ PO TBCR: 10.0000 meq | EXTENDED_RELEASE_TABLET | Freq: Every day | ORAL | 3 refills | Status: DC

## 2020-07-03 ENCOUNTER — Encounter: Payer: Self-pay | Admitting: Family Medicine

## 2020-07-03 ENCOUNTER — Telehealth (INDEPENDENT_AMBULATORY_CARE_PROVIDER_SITE_OTHER): Payer: BC Managed Care – PPO | Admitting: Family Medicine

## 2020-07-03 ENCOUNTER — Other Ambulatory Visit: Payer: Self-pay

## 2020-07-03 VITALS — BP 135/86 | HR 76 | Temp 95.2°F | Wt 197.0 lb

## 2020-07-03 DIAGNOSIS — J0100 Acute maxillary sinusitis, unspecified: Secondary | ICD-10-CM

## 2020-07-03 MED ORDER — AMOXICILLIN-POT CLAVULANATE 875-125 MG PO TABS: 1.0000 | ORAL_TABLET | Freq: Two times a day (BID) | ORAL | 0 refills | Status: DC

## 2020-07-03 NOTE — Progress Notes (Signed)
Virtual Visit via Video Note  I connected with Jason [name] on 07/03/20 at 12:00 PM EDT by a video enabled telemedicine application and verified that I am speaking with the correct person using two identifiers.  Location: Patient: home/car Provider: office   I discussed the limitations of evaluation and management by telemedicine and the availability of in person appointments. The patient expressed understanding and agreed to proceed.  Parties involved in encounter  Patient: Jason [name]   Provider:  Loura Pardon MD   History of Present Illness: 65 yo pt of Dr Silvio Pate presents with congestion and facial pain   Started getting this about a week ago with runny nose/congestion and cough  A cold he thought   Sat/sunday he started to get a worse headache , Monday also  Made him nauseated (worst R cheek rad into ear and jaw)  Now not as intense and no nausea (never had diarrhea)   Nasal d/c - yellow//green  (no blood) , to brown   No rash  A little scratchy throat   Did not do a covid test because he had fever  Has one at home and will test today   Some cough- not bad  Some phlegm   No body aches or fever   otc flonase Nasal saline rinse  Generic robitussin   He is vaccinated for covid with a booster    Patient Active Problem List   Diagnosis Date Noted  . Unstable angina ([place]) 03/15/2020  . PVC's (premature ventricular contractions) 03/15/2020  . Hypokalemia 03/14/2020  . Palpitations 03/14/2020  . Oral lesion 10/10/2019  . BPH without obstruction/lower urinary tract symptoms 11/07/2013  . Acute sinusitis 10/27/2013  . Routine general medical examination at a health care facility 06/24/2010  . Hypothyroidism 06/19/2009  . OSTEOARTHRITIS 12/19/2008  . BACK PAIN, LUMBAR 02/26/2007  . Essential hypertension, benign 09/17/2006  . ALLERGIC RHINITIS 09/17/2006  . GERD 09/17/2006   Past Medical History:  Diagnosis Date  . Allergy   . Arthritis     osteoarthritis  . GERD (gastroesophageal reflux disease)   . Hypertension   . Thyroid disease    Hyperthyroidism s/p radioactive iodine ablation   Past Surgical History:  Procedure Laterality Date  . CARDIAC CATHETERIZATION  03-2000  . LEFT HEART CATH AND CORONARY ANGIOGRAPHY Left 03/20/2020   Procedure: LEFT HEART CATH AND CORONARY ANGIOGRAPHY;  Surgeon: Nelva Bush, MD;  Location: [facility];  Service: Cardiovascular;  Laterality: Left;  . ROTATOR CUFF REPAIR  10/12   Dr [name] Heck  . TONSILLECTOMY    . 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 TOTAL HIP ARTHROPLASTY Left 06/2016   Dr Harlow Mares   Social History   Tobacco Use  . Smoking status: Never Smoker  . Smokeless tobacco: Never Used  Vaping Use  . Vaping Use: Never used  Substance Use Topics  . Alcohol use: Yes    Comment: Alcohol once every few weeks/months  . Drug use: No   Family History  Problem Relation Age of Onset  . Gout Father   . Heart attack Father 64  . Hypertension Mother   . Stroke Mother   . Colon cancer Neg Hx   . Esophageal cancer Neg Hx   . Rectal cancer Neg Hx   . Stomach cancer Neg Hx    No Known Allergies Current Outpatient Medications on File Prior to Visit  Medication Sig Dispense Refill  . acetaminophen (TYLENOL) 325 MG tablet Take 650 mg by mouth 2 (  two) times daily as needed.    . Ascorbic Acid (VITA-C PO) Take 1,000 mg by mouth daily.    Marland Kitchen CRANBERRY PO Take 1 tablet by mouth daily.    . cyclobenzaprine (FLEXERIL) 10 MG tablet TAKE 1 TABLET BY MOUTH EVERY DAY AT NIGHT AS NEEDED 90 tablet 1  . levothyroxine (SYNTHROID) 88 MCG tablet Take 1 tablet (88 mcg total) by mouth daily. 90 tablet 3  . lisinopril (ZESTRIL) 20 MG tablet Take 1 tablet (20 mg total) by mouth daily. 30 tablet 3  . loratadine (CLARITIN) 10 MG tablet Take 10 mg by mouth daily. Can take 2 tabs if needed    . MAGNESIUM OXIDE PO Take by mouth daily. Pt unsure of dose    . metoprolol succinate (TOPROL XL) 100 MG  24 hr tablet Take 1 tablet (100 mg total) by mouth daily. Take with or immediately following a meal. 90 tablet 1  . Multiple Vitamin (MULTIVITAMIN) tablet Take 1 tablet by mouth daily.    . potassium chloride (KLOR-CON) 10 MEQ tablet Take 1 tablet (10 mEq total) by mouth daily. 30 tablet 3  . sildenafil (REVATIO) 20 MG tablet Take 3-5 tablets (60-100 mg total) by mouth daily as needed. 50 tablet 11  . tamsulosin (FLOMAX) 0.4 MG CAPS capsule TAKE 1 CAPSULE BY MOUTH EVERY DAY 90 capsule 3  . torsemide (DEMADEX) 10 MG tablet Take 1 tablet (10 mg total) by mouth daily. 30 tablet 3   No current facility-administered medications on file prior to visit.   Review of Systems  Constitutional: Negative for chills, fever and malaise/fatigue.  HENT: Positive for congestion and sinus pain. Negative for ear pain and sore throat.   Eyes: Negative for blurred vision, discharge and redness.  Respiratory: Positive for cough and sputum production. Negative for shortness of breath and stridor.   Cardiovascular: Negative for chest pain, palpitations and leg swelling.  Gastrointestinal: Negative for abdominal pain, diarrhea, nausea and vomiting.  Musculoskeletal: Negative for myalgias.  Skin: Negative for rash.  Neurological: Negative for dizziness and headaches.    Observations/Objective: Patient appears well, in no distress Weight is baseline  No facial swelling or asymmetry Some tenderness in maxillary sinus areas worse on R Normal voice-not hoarse and no slurred speech No obvious tremor or mobility impairment Moving neck and UEs normally Able to hear the call well  No cough or shortness of breath during interview  Talkative and mentally sharp with no cognitive changes No skin changes on face or neck , no rash or pallor Affect is normal    Assessment and Plan: Problem List Items Addressed This Visit      Respiratory   Acute sinusitis - Primary    With cold symptoms for over a week and now mod to  severe facial pain worse on the R sympt care discussed-fluids and saline rinse  augmentin px sent  Pt plans to do a home covid test today and message [place] with result (isolate until then)  He is covid immunized with a booster ER parameters discussed Update if not starting to improve in a week or if worsening        Relevant Medications   amoxicillin-clavulanate (AUGMENTIN) 875-125 MG tablet       Follow Up Instructions: Please do a covid home test and message [place] with results Take the augmentin for sinus infection  Continue saline rinses  Breathing steam may also help infection   Update if not starting to improve in a week or  if worsening  If suddenly worse or short of breath-go to the ER   I discussed the assessment and treatment plan with the patient. The patient was provided an opportunity to ask questions and all were answered. The patient agreed with the plan and demonstrated an understanding of the instructions.   The patient was advised to call back or seek an in-person evaluation if the symptoms worsen or if the condition fails to improve as anticipated.     Loura Pardon, MD

## 2020-07-03 NOTE — Patient Instructions (Signed)
Please do a covid home test and message [place] with results Take the augmentin for sinus infection  Continue saline rinses  Breathing steam may also help infection   Update if not starting to improve in a week or if worsening  If suddenly worse or short of breath-go to the ER

## 2020-07-03 NOTE — Assessment & Plan Note (Signed)
With cold symptoms for over a week and now mod to severe facial pain worse on the R sympt care discussed-fluids and saline rinse  augmentin px sent  Pt plans to do a home covid test today and message [place] with result (isolate until then)  He is covid immunized with a booster ER parameters discussed Update if not starting to improve in a week or if worsening

## 2020-07-19 ENCOUNTER — Other Ambulatory Visit: Payer: Self-pay

## 2020-07-19 ENCOUNTER — Ambulatory Visit (INDEPENDENT_AMBULATORY_CARE_PROVIDER_SITE_OTHER): Payer: BC Managed Care – PPO

## 2020-07-19 DIAGNOSIS — R011 Cardiac murmur, unspecified: Secondary | ICD-10-CM

## 2020-07-19 LAB — ECHOCARDIOGRAM COMPLETE
AR max vel: 2.05 cm2
AV Area VTI: 2.11 cm2
AV Area mean vel: 2.18 cm2
AV Mean grad: 6 mmHg
AV Peak grad: 12 mmHg
Ao pk vel: 1.73 m/s
Area-P 1/2: 3.81 cm2
Calc EF: 51.4 %
S' Lateral: 3.6 cm
Single Plane A2C EF: 51.3 %
Single Plane A4C EF: 51.8 %

## 2020-07-24 ENCOUNTER — Telehealth: Payer: Self-pay

## 2020-07-24 NOTE — Telephone Encounter (Signed)
Janan Ridge, [place]  07/24/2020 3:10 PM EDT Back to Top     Attempted to call patient.  Left message with person that answered phone to have patient return call.  Phone note started.    Arvil Chaco, PA-C  07/23/2020 4:26 PM EDT      Echo shows --Low normal pump function or heart squeeze.  --Walls of the heart are moving normally  --Mild thickening of the heart walls, which can happen over time with elevated BP  Recommendations: --Reassess sx and go over the echo findings at his upcoming clinic visit. --Control risk factors moving forward, including BP control.

## 2020-07-25 ENCOUNTER — Other Ambulatory Visit
Admission: RE | Admit: 2020-07-25 | Discharge: 2020-07-25 | Disposition: A | Discharge status: Home / Self Care | Payer: BC Managed Care – PPO | Attending: Physician Assistant | Admitting: Physician Assistant

## 2020-07-25 ENCOUNTER — Encounter: Payer: Self-pay | Admitting: Physician Assistant

## 2020-07-25 ENCOUNTER — Other Ambulatory Visit: Payer: Self-pay

## 2020-07-25 ENCOUNTER — Ambulatory Visit (INDEPENDENT_AMBULATORY_CARE_PROVIDER_SITE_OTHER): Payer: BC Managed Care – PPO | Admitting: Physician Assistant

## 2020-07-25 VITALS — BP 136/70 | HR 77 | Ht 69.0 in | Wt 200.0 lb

## 2020-07-25 DIAGNOSIS — Z13220 Encounter for screening for lipoid disorders: Secondary | ICD-10-CM | POA: Diagnosis not present

## 2020-07-25 DIAGNOSIS — I493 Ventricular premature depolarization: Secondary | ICD-10-CM

## 2020-07-25 DIAGNOSIS — I5020 Unspecified systolic (congestive) heart failure: Secondary | ICD-10-CM | POA: Diagnosis not present

## 2020-07-25 LAB — COMPREHENSIVE METABOLIC PANEL
ALT: 27 U/L (ref 0–44)
AST: 29 U/L (ref 15–41)
Albumin: 4 g/dL (ref 3.5–5.0)
Alkaline Phosphatase: 57 U/L (ref 38–126)
Anion gap: 10 (ref 5–15)
BUN: 20 mg/dL (ref 8–23)
CO2: 28 mmol/L (ref 22–32)
Calcium: 9.3 mg/dL (ref 8.9–10.3)
Chloride: 100 mmol/L (ref 98–111)
Creatinine, Ser: 0.89 mg/dL (ref 0.61–1.24)
GFR, Estimated: >60 mL/min (ref >60)
Glucose, Bld: 99 mg/dL (ref 70–99)
Potassium: 3.4 mmol/L — ABNORMAL LOW (ref 3.5–5.1)
Sodium: 138 mmol/L (ref 135–145)
Total Bilirubin: 1.1 mg/dL (ref 0.3–1.2)
Total Protein: 7.2 g/dL (ref 6.5–8.1)

## 2020-07-25 LAB — BRAIN NATRIURETIC PEPTIDE: B Natriuretic Peptide: 42.1 pg/mL (ref 0.0–100.0)

## 2020-07-25 LAB — LIPID PANEL
Cholesterol: 163 mg/dL (ref 0–200)
HDL: 42 mg/dL (ref >40)
LDL Cholesterol: 106 mg/dL — ABNORMAL HIGH (ref 0–99)
Total CHOL/HDL Ratio: 3.9 RATIO
Triglycerides: 76 mg/dL (ref <150)
VLDL: 15 mg/dL (ref 0–40)

## 2020-07-25 LAB — TSH: TSH: 5.667 u[IU]/mL — ABNORMAL HIGH (ref 0.350–4.500)

## 2020-07-25 NOTE — Patient Instructions (Signed)
Medication Instructions:  Your physician recommends that you continue on your current medications as directed. Please refer to the Current Medication list given to you today.  *If you need a refill on your cardiac medications before your next appointment, please call your pharmacy*   Lab Work: Lipid, Cmp, Bnp, Tsh today  Please have your labs drawn at the [facility] medical mall. Stop at the registration desk to check in. If you have labs (blood work) drawn today and your tests are completely normal, you will receive your results only by: Marland Kitchen MyChart Message (if you have MyChart) OR . A paper copy in the mail If you have any lab test that is abnormal or we need to change your treatment, we will call you to review the results.   Testing/Procedures: None ordered   Follow-Up: At [facility], you and your health needs are our priority.  As part of our continuing mission to provide you with exceptional heart care, we have created designated Provider Care Teams.  These Care Teams include your primary Cardiologist (physician) and Advanced Practice Providers (APPs -  Physician Assistants and Nurse Practitioners) who all work together to provide you with the care you need, when you need it.  We recommend signing up for the patient portal called "MyChart".  Sign up information is provided on this After Visit Summary.  MyChart is used to connect with patients for Virtual Visits (Telemedicine).  Patients are able to view lab/test results, encounter notes, upcoming appointments, etc.  Non-urgent messages can be sent to your provider as well.   To learn more about what you can do with MyChart, go to [email or web address].    Your next appointment:   Your physician wants you to follow-up in: 6 months You will receive a reminder letter in the mail two months in advance. If you don't receive a letter, please call our office to schedule the follow-up appointment.   The format for your next appointment:    In Person  Provider:   You may see Nelva Bush, MD or one of the following Advanced Practice Providers on your designated Care Team:    Murray Hodgkins, NP  Christell Faith, PA-C  Marrianne Mood, PA-C  Cadence Saint [place], [place]  Laurann Montana, NP    Other Instructions N/A

## 2020-07-25 NOTE — Progress Notes (Signed)
Office Visit    Patient Name: Jason [name] Date of Encounter: 07/25/2020  PCP:  Venia Carbon, MD   [place]  Cardiologist:  Nelva Bush, MD  Advanced Practice Provider:  No care team member to display Electrophysiologist:  None   Chief Complaint    Chief Complaint  Patient presents with  . Other    Follow up post ECHO. Meds reviewed verbally with patient.     65 year old male with history of hypertension, thyroid disease, GERD, osteoarthritis, and seen today for 2 week follow-up after echocardiogram.  Past Medical History    Past Medical History:  Diagnosis Date  . Allergy   . Arthritis    osteoarthritis  . GERD (gastroesophageal reflux disease)   . Hypertension   . Thyroid disease    Hyperthyroidism s/p radioactive iodine ablation   Past Surgical History:  Procedure Laterality Date  . CARDIAC CATHETERIZATION  03-2000  . LEFT HEART CATH AND CORONARY ANGIOGRAPHY Left 03/20/2020   Procedure: LEFT HEART CATH AND CORONARY ANGIOGRAPHY;  Surgeon: Nelva Bush, MD;  Location: [facility];  Service: Cardiovascular;  Laterality: Left;  . ROTATOR CUFF REPAIR  10/12   Dr [name] Heck  . TONSILLECTOMY    . 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 TOTAL HIP ARTHROPLASTY Left 06/2016   Dr Harlow Mares    Allergies  No Known Allergies  History of Present Illness    Jason [name] is a 65 y.o. male with PMH as above.    He was seen in the hospital 02/2020 after feeling chest fluttering following exercise at the [facility] gym on 2 separate occasions.  After his first episode of chest fluttering, he presented to the ED with work-up largely unremarkable, and he was sent home with diagnosis of PVCs and panic attack.  He returned to [facility] a second time after a recurrent episode of fluttering, resolving with deeper breathing.  He was admitted and seen in the hospital by [facility] cardiology, at which time he reported occasional lightheadedness on  separate occasions.  During consultation, it was noted there was concern his episodes may reflect coronary insufficiency given their association with exercises and ST/T changes on EKG.    Outpatient catheterization was recommended.  He was started on ASA and metoprolol 25 mg daily.  Nitroglycerin was deferred in the setting of using sildenafil.  Diagnostic/expedited LHC 03/20/2020 was without significant CAD and showed mildly elevated LVEDP, diastolic dysfunction.  Primary prevention of CAD was recommended.  He was continued on metoprolol for PVCs.  Cardiac monitoring and echo were recommended.  He was seen at follow-up 03/30/2020 and had started work at [employer].  He reported recurrent palpitations.  His most recent episode had occurred the previous morning while trying to catch his cat.  Most of the episodes were precipitated by exertion.  His episodes continue to improve with deeper breathing.  BP 158/100.  He was noted to take his blood pressure once a week on Saturdays.  He reported home BP usually 130s over 80s.  He was working to decrease salt and caffeine intake.  Increase of beta-blocker was discussed with patient preference to first try lifestyle changes.    2-week cardiac monitoring was ordered and showed NSR, rare PVCs/PACs, NSVT, PSVT.  At follow-up to review the monitor, BP noted to be elevated again.  At follow-up 05/11/2020, Toprol was increased to 100 mg daily for better BP control and suppression of ectopy.  Seen 05/28/2020 and reportedly doing  the same as previous clinic visits.  He had forgotten to take his AM pills the previous morning with BP elevated.  At that time, he experienced a pounding heart rate/racing heart rate and skipped heartbeats.  BP 172/96.  After taking BP pills, BP 141/87.  No associated chest pain, dyspnea, shortness of breath.  On the way into clinic that day, he reported anxiety that improved with deeper breathing.  He also noted epigastric pain that was brief in nature  without clear triggers.  No clear association with food.  He reported frequent bowel movements.  He also had chronic lower extremity edema with right greater than that of left.  He noted early satiety and weight gain.  BP 142/100 with repeat BP 152/100.  He reported eating saltines and soup immediately before his visit.  Recommendation was to initiate torsemide 10 mg daily, as it was felt that he was volume overloaded on exam.  HCTZ was discontinued to avoid electrolyte abnormalities.  Labs obtained as below.  No caffeine use reported.  He did report some signs or symptoms consistent with sleep apnea with wife reporting that he snores at night.  Watch pat discussed with patient preference to defer at that time.  Seen 05/2020 with improvement in volume status.  BP also noted to be improved.  He was watching his fluid and salt intake.  He reported realizing he was drinking more than 4 L/day and just water.  BP log reviewed with BP 150/101 at max and 103/60 minimum.  Weight 206.  His wife is monitoring his sleeping and denied consistent snoring or that he stops breathing at night.  He reported only snoring when congested.  He reported intermittent racing heart rate without clear triggers.  No regular workout routine.  No exertional chest pain or shortness of breath.  Echo was ordered with plan for diltiazem if ongoing tachypalpitations.  07/19/2020 echo with EF 50 to 55%, NR WMA, mild LVH, G1 DD, RV SF low normal, normal RV size and function.  Today, 07/25/2020, he returns to clinic and continues to note improvement in symptoms.  He reports resolution of palpitations since starting on potassium.  His swelling is significantly improved.  Denies any chest pain or shortness of breath at rest or with exertion.  Blood pressure at home reviewed with SBP 127 and 119 and DBP 74-86.  Heart rate 61-83 bpm.  Weights have been 195-196 and fairly consistent.  Overall, weight has improved from 206 and BP improved from 140/90.  He  is anxious to try heavier cardiovascular activity with recommendation to listen to his body with patient understanding.  No signs or symptoms of bleeding.  He reports medication compliance.  Echocardiogram results reviewed.  Home Medications    Current Outpatient Medications on File Prior to Visit  Medication Sig Dispense Refill  . acetaminophen (TYLENOL) 325 MG tablet Take 650 mg by mouth 2 (two) times daily as needed.    Marland Kitchen amoxicillin (AMOXIL) 500 MG capsule Take 4 tablets by mouth prior to dental procedures.    . Ascorbic Acid (VITA-C PO) Take 1,000 mg by mouth daily.    Marland Kitchen CRANBERRY PO Take 1 tablet by mouth daily.    . cyclobenzaprine (FLEXERIL) 10 MG tablet TAKE 1 TABLET BY MOUTH EVERY DAY AT NIGHT AS NEEDED 90 tablet 1  . levothyroxine (SYNTHROID) 88 MCG tablet Take 1 tablet (88 mcg total) by mouth daily. 90 tablet 3  . lisinopril (ZESTRIL) 20 MG tablet Take 1 tablet (20 mg total)  by mouth daily. 30 tablet 3  . loratadine (CLARITIN) 10 MG tablet Take 10 mg by mouth daily. Can take 2 tabs if needed    . MAGNESIUM OXIDE PO Take by mouth daily. Pt unsure of dose    . metoprolol succinate (TOPROL XL) 100 MG 24 hr tablet Take 1 tablet (100 mg total) by mouth daily. Take with or immediately following a meal. 90 tablet 1  . Multiple Vitamin (MULTIVITAMIN) tablet Take 1 tablet by mouth daily.    . potassium chloride (KLOR-CON) 10 MEQ tablet Take 1 tablet (10 mEq total) by mouth daily. 30 tablet 3  . sildenafil (REVATIO) 20 MG tablet Take 3-5 tablets (60-100 mg total) by mouth daily as needed. 50 tablet 11  . tamsulosin (FLOMAX) 0.4 MG CAPS capsule TAKE 1 CAPSULE BY MOUTH EVERY DAY 90 capsule 3  . torsemide (DEMADEX) 10 MG tablet Take 1 tablet (10 mg total) by mouth daily. 30 tablet 3   No current facility-administered medications on file prior to visit.    Review of Systems    He denies chest pain,  dyspnea, pnd, orthopnea, n, v, dizziness, syncope, weight gain, or further early satiety.  He  has noted improvement in previous palpitations.  He reports improvement in previous edema and weight.  All other systems reviewed and are otherwise negative except as noted above.  Physical Exam    VS:  BP 136/70 (BP Location: Left Arm, Patient Position: Sitting, Cuff Size: Normal)   Pulse 77   Ht 5\' 9"  (1.753 m)   Wt 200 lb (90.7 kg)   SpO2 98%   BMI 29.53 kg/m  , BMI Body mass index is 29.53 kg/m. GEN: Well nourished, well developed, in no acute distress.  Mask in place. HEENT: normal. Neck: Supple.no carotid bruits, or masses.  JVD difficult to assess due to body habitus. Cardiac: RRR, 1/6 systolic murmur (faint, only appreciated with use of stethoscope microphone) .  No rubs, or gallops. No clubbing, cyanosis.  No lower extremity edema.  Radials/DP/PT 2+ and equal bilaterally.  Respiratory: CTAB. GI: Soft, nontender, nondistended, BS + x 4. MS: no deformity or atrophy. Skin: warm and dry, no rash. Neuro:  Strength and sensation are intact. Psych: Normal affect.  Accessory Clinical Findings    ECG personally reviewed by me today -no EKG- no acute changes.  VITALS Reviewed today   Temp Readings from Last 3 Encounters:  07/03/20 (!) 95.2 F (35.1 C)  03/20/20 98.9 F (37.2 C) (Oral)  03/14/20 98 F (36.7 C) (Temporal)   BP Readings from Last 3 Encounters:  07/25/20 136/70  07/03/20 135/86  06/13/20 120/86   Pulse Readings from Last 3 Encounters:  07/25/20 77  07/03/20 76  06/13/20 77    Wt Readings from Last 3 Encounters:  07/25/20 200 lb (90.7 kg)  07/03/20 197 lb (89.4 kg)  06/13/20 204 lb (92.5 kg)     LABS  reviewed today   Lab Results  Component Value Date   WBC 7.6 03/16/2020   HGB 15.5 03/16/2020   HCT 45.9 03/16/2020   MCV 92.4 03/16/2020   PLT 269 03/16/2020   Lab Results  Component Value Date   CREATININE 0.89 07/25/2020   BUN 20 07/25/2020   NA 138 07/25/2020   K 3.4 (L) 07/25/2020   CL 100 07/25/2020   CO2 28 07/25/2020   Lab  Results  Component Value Date   ALT 27 07/25/2020   AST 29 07/25/2020   ALKPHOS 57 07/25/2020  BILITOT 1.1 07/25/2020   Lab Results  Component Value Date   CHOL 163 07/25/2020   HDL 42 07/25/2020   LDLCALC 106 (H) 07/25/2020   TRIG 76 07/25/2020   CHOLHDL 3.9 07/25/2020    No results found for: HGBA1C Lab Results  Component Value Date   TSH 5.667 (H) 07/25/2020     STUDIES/PROCEDURES reviewed today   Cardiovascular History & Procedures: Cardiovascular Problems:  Chest discomfort and palpitations  Risk Factors:  Hypertension, male gender, obesity, and age > 27  Cath/PCI:  LHC (04/23/2000): Normal coronary arteries.  Normal LVEF.  Tortuous iliac arteries.  CV Surgery:  None  EP Procedures and Devices:  None  Non-Invasive Evaluation(s):  None available   Cardiac cath 02/2020 Conclusions: 1. No angiographically significant coronary artery disease. 2. Normal left ventricular contraction with mildly elevated filling pressures consistent with diastolic dysfunction. Recommendations: 1. Primary prevention of coronary artery disease. 2. Continue blood pressure control as well as recently added metoprolol for management of incidentally noted PVCs. If symptoms persist, ambulatory cardiac monitoring +/- echcardiography will need to be considered. Coronary Diagrams   Diagnostic Dominance: Right        Heart monitor 2022   The patient was monitored for 14 days.  The predominant rhythm was sinus with an average rate of 85 bpm (range 47 - 122 bpm in sinus).  There were rare PAC's and occasional PVC's.  There were 6 atrial runs lasting up to 10.8 seconds with a maximum rate of 154 bpm.  There were 9 episodes of wide complex tachycardia lasting up to 8 beats with a maximum rate of 231 bpm. Episodes most likely represent NSVT, though some tracings could also reflect SVT with aberrancy.  No sustained arrhythmia or prolonged pause was  identified.  Patient triggered events correspond to sinus rhythm with PVC's.  Sinus rhythm with rare PAC's and occasional PVC's. Brief runs of PSVT and NSVT noted, as detailed above.  Echocardiogram 07/19/2020 1. Left ventricular ejection fraction, by estimation, is 50 to 55%. The  left ventricle has low normal function. The left ventricle has no regional  wall motion abnormalities. There is mild left ventricular hypertrophy.  Left ventricular diastolic  parameters are consistent with Grade I diastolic dysfunction (impaired  relaxation). The average left ventricular global longitudinal strain is  -13.2 %. The global longitudinal strain is abnormal.  2. Right ventricular systolic function is low normal. The right  ventricular size is normal.  3. The mitral valve is normal in structure. No evidence of mitral valve  regurgitation.  4. The aortic valve is tricuspid. Aortic valve regurgitation is not  visualized.   Assessment & Plan    Chest pain, resolved -- No further reported chest pain or upper epigastric pain.  Upper epigastric pain has resolved with diuresis.  Previous 02/2020 LHC showing normal coronary arteries. No further ischemic/ workup at this time. Recommend ongoing aggressive risk factor modification.  We will recheck liver function and lipids today to ensure well controlled /optimize risk factor modification.  Continue current medications. Encouraged ongoing activity. Reviewed heart healthy diet.  HTN, goal BP 130/80 or lower -- BP well controlled on review of clinic vitals and home vitals and since initiation of torsemide.  Ongoing salt and fluid restrictions reviewed -he notes significant improvement with cutting back fluid and salt.  Continue current torsemide and lisinopril.    Chronic HFpEF -- Volume status significantly improved since initiation of torsemide 10mg  daily.  Continue with lisinopril.  Echo as above with  low normal LVEF, RVSF.  He denies any associated  chest pain or dyspnea.  Euvolemic and well compensated on exam.  Will obtain BMET today, as well as BNP.  He will continue to monitor weight, BP, and heart rate at home.  He will contact the office if any new or concerning symptoms.  Tachypalpitations -Reports resolution of tachypalpitations with potassium repletion. Recent monitor as above.  Continue current beta-blocker.  Will defer from transitioning to diltiazem given low normal EF.  Will recheck electrolytes with CMP.  We will also obtain TSH today to confirm WNL given previous values as above.   RTC 6 months or sooner if needed Labs: Obtain lipid, CMP, BNP, and TSH today  Arvil Chaco, PA-C 07/25/2020

## 2020-07-26 ENCOUNTER — Telehealth: Payer: Self-pay

## 2020-07-26 DIAGNOSIS — E876 Hypokalemia: Secondary | ICD-10-CM

## 2020-07-26 MED ORDER — SPIRONOLACTONE 25 MG PO TABS: 12.5000 mg | ORAL_TABLET | Freq: Every day | ORAL | 5 refills | Status: DC

## 2020-07-26 MED ORDER — ROSUVASTATIN CALCIUM 5 MG PO TABS: 5.0000 mg | ORAL_TABLET | Freq: Every day | ORAL | 5 refills | Status: DC

## 2020-07-26 NOTE — Telephone Encounter (Signed)
DPR on file. Patients wife made aware of lab results and Marrianne Mood, PA recommendation. Rx for crestor 5 mg qd and spironolactone 12.5 mg qd has been sent to the patients pharmacy. Order placed for 2 wk repeat bmp to be drawn at the medical mall.  Patient would prefer to have fasting lipid and lft in Aug 2022 with his pcp. Adv the pt wife that pt should f/u with pcp regarding abnormal tsh and med adjustment.  Pt wife verbalized understanding with read back and voiced appreciation for the call.

## 2020-07-26 NOTE — Telephone Encounter (Signed)
-----   Message from Arvil Chaco, PA-C sent at 07/26/2020  1:32 PM EDT ----- Labs show --TSH still abnormal.  Will defer to PCP regarding treatment and management. --LDL 106 and slightly above goal. --Potassium low at 3.4 with goal potassium 4.0. --Renal function stable. --Fluid lab improved from 1 month ago and 42.1  Recommendations: --Will Cc / send labs to PCP for management of TSH /Synthroid. -- Potassium still low, despite potassium supplementation started after previous labs. -- If agreeable, start/add spironolactone 12.5mg  daily for BP/K+ support - Continue with existing K supplement. -- BMET in 2 weeks to confirm this helps his K level. -- Fluid labs confirms improved volume status, which is great news, and confirms what we discussed in clinic. -- If agreeable, start Crestor 5mg  daily for risk factor modification. We can recheck lipid and liver function at his next clinic or PCP visit.

## 2020-08-05 ENCOUNTER — Other Ambulatory Visit: Payer: Self-pay | Admitting: Internal Medicine

## 2020-08-06 NOTE — Telephone Encounter (Signed)
Last filled 05-23-20 #90 Last OV Acute 07-03-20 Next OV 10-15-20 [facility] [place]

## 2020-08-08 ENCOUNTER — Other Ambulatory Visit: Payer: Self-pay

## 2020-08-08 MED ORDER — LISINOPRIL 20 MG PO TABS: 20.0000 mg | ORAL_TABLET | Freq: Every day | ORAL | 3 refills | Status: DC

## 2020-08-09 ENCOUNTER — Other Ambulatory Visit: Payer: Self-pay | Admitting: Internal Medicine

## 2020-08-10 ENCOUNTER — Other Ambulatory Visit: Payer: Self-pay | Admitting: *Deleted

## 2020-08-10 MED ORDER — TORSEMIDE 10 MG PO TABS: 10.0000 mg | ORAL_TABLET | Freq: Every day | ORAL | 6 refills | Status: DC

## 2020-08-14 ENCOUNTER — Other Ambulatory Visit
Admission: RE | Admit: 2020-08-14 | Discharge: 2020-08-14 | Disposition: A | Discharge status: Home / Self Care | Payer: BC Managed Care – PPO | Source: Ambulatory Visit | Attending: Physician Assistant | Admitting: Physician Assistant

## 2020-08-14 DIAGNOSIS — E876 Hypokalemia: Secondary | ICD-10-CM | POA: Diagnosis not present

## 2020-08-14 LAB — BASIC METABOLIC PANEL
Anion gap: 8 (ref 5–15)
BUN: 28 mg/dL — ABNORMAL HIGH (ref 8–23)
CO2: 28 mmol/L (ref 22–32)
Calcium: 9.2 mg/dL (ref 8.9–10.3)
Chloride: 103 mmol/L (ref 98–111)
Creatinine, Ser: 0.98 mg/dL (ref 0.61–1.24)
GFR, Estimated: >60 mL/min (ref >60)
Glucose, Bld: 92 mg/dL (ref 70–99)
Potassium: 3.7 mmol/L (ref 3.5–5.1)
Sodium: 139 mmol/L (ref 135–145)

## 2020-09-26 ENCOUNTER — Other Ambulatory Visit: Payer: Self-pay | Admitting: Internal Medicine

## 2020-09-27 ENCOUNTER — Other Ambulatory Visit: Payer: Self-pay | Admitting: *Deleted

## 2020-09-27 MED ORDER — LISINOPRIL 20 MG PO TABS: 20.0000 mg | ORAL_TABLET | Freq: Every day | ORAL | 4 refills | Status: DC

## 2020-10-15 ENCOUNTER — Encounter: Payer: Self-pay | Admitting: Internal Medicine

## 2020-10-15 ENCOUNTER — Other Ambulatory Visit: Payer: Self-pay

## 2020-10-15 ENCOUNTER — Ambulatory Visit (INDEPENDENT_AMBULATORY_CARE_PROVIDER_SITE_OTHER): Payer: BC Managed Care – PPO | Admitting: Internal Medicine

## 2020-10-15 DIAGNOSIS — N400 Enlarged prostate without lower urinary tract symptoms: Secondary | ICD-10-CM

## 2020-10-15 DIAGNOSIS — Z0000 Encounter for general adult medical examination without abnormal findings: Secondary | ICD-10-CM

## 2020-10-15 DIAGNOSIS — I10 Essential (primary) hypertension: Secondary | ICD-10-CM

## 2020-10-15 DIAGNOSIS — E039 Hypothyroidism, unspecified: Secondary | ICD-10-CM

## 2020-10-15 NOTE — Progress Notes (Signed)
Subjective:    Patient ID: Jason [name], male    DOB: Jun 28, 1955, 65 y.o.   MRN: [id number]  HPI Here for physical This visit occurred during the SARS-CoV-2 public health emergency.  Safety protocols were in place, including screening questions prior to the visit, additional usage of staff PPE, and extensive cleaning of exam room while observing appropriate contact time as indicated for disinfecting solutions. '  He feels well Reviewed all of the cardiology records---negative cath, but put on crestor anyway LDL from 106-66 No apparent side effects Discussed that this is per cardiology (and I am not as excited that this is essential)  Does have better fluid status and weight down on the torsemide Off HCTZ now Weighing himself daily--fairly steady now BP daily Almost all systolics under [id number]  Current Outpatient Medications on File Prior to Visit  Medication Sig Dispense Refill   acetaminophen (TYLENOL) 325 MG tablet Take 650 mg by mouth 2 (two) times daily as needed.     amoxicillin (AMOXIL) 500 MG capsule Take 4 tablets by mouth prior to dental procedures.     Ascorbic Acid (VITA-C PO) Take 1,000 mg by mouth daily.     CRANBERRY PO Take 1 tablet by mouth daily.     cyclobenzaprine (FLEXERIL) 10 MG tablet TAKE 1 TABLET BY MOUTH EVERY DAY AT NIGHT AS NEEDED 90 tablet 1   fexofenadine (ALLEGRA) 180 MG tablet Take 180 mg by mouth daily.     levothyroxine (SYNTHROID) 88 MCG tablet TAKE 1 TABLET BY MOUTH EVERY DAY 90 tablet 3   lisinopril (ZESTRIL) 20 MG tablet Take 1 tablet (20 mg total) by mouth daily. 30 tablet 4   loratadine (CLARITIN) 10 MG tablet Take 10 mg by mouth daily. Can take 2 tabs if needed     MAGNESIUM OXIDE PO Take by mouth daily. Pt unsure of dose     metoprolol succinate (TOPROL XL) 100 MG 24 hr tablet Take 1 tablet (100 mg total) by mouth daily. Take with or immediately following a meal. 90 tablet 1   Multiple Vitamin (MULTIVITAMIN) tablet Take 1 tablet by mouth  daily.     potassium chloride (KLOR-CON) 10 MEQ tablet Take 1 tablet (10 mEq total) by mouth daily. 30 tablet 3   rosuvastatin (CRESTOR) 5 MG tablet Take 1 tablet (5 mg total) by mouth daily. 30 tablet 5   sildenafil (REVATIO) 20 MG tablet Take 3-5 tablets (60-100 mg total) by mouth daily as needed. 50 tablet 11   spironolactone (ALDACTONE) 25 MG tablet Take 0.5 tablets (12.5 mg total) by mouth daily. 15 tablet 5   tamsulosin (FLOMAX) 0.4 MG CAPS capsule TAKE 1 CAPSULE BY MOUTH EVERY DAY 90 capsule 3   torsemide (DEMADEX) 10 MG tablet Take 1 tablet (10 mg total) by mouth daily. 30 tablet 6   No current facility-administered medications on file prior to visit.    No Known Allergies  Past Medical History:  Diagnosis Date   Allergy    Arthritis    osteoarthritis   GERD (gastroesophageal reflux disease)    Hypertension    Thyroid disease    Hyperthyroidism s/p radioactive iodine ablation    Past Surgical History:  Procedure Laterality Date   CARDIAC CATHETERIZATION  03-2000   LEFT HEART CATH AND CORONARY ANGIOGRAPHY Left 03/20/2020   Procedure: LEFT HEART CATH AND CORONARY ANGIOGRAPHY;  Surgeon: Nelva Bush, MD;  Location: [facility];  Service: Cardiovascular;  Laterality: Left;   ROTATOR CUFF REPAIR  10/12   Dr [name] Heck   TONSILLECTOMY     TOTAL HIP ARTHROPLASTY Right 02/24/2009   TOTAL HIP ARTHROPLASTY Left 06/2016   Dr Harlow Mares    Family History  Problem Relation Age of Onset   Gout Father    Heart attack Father 37   Hypertension Mother    Stroke Mother    Colon cancer Neg Hx    Esophageal cancer Neg Hx    Rectal cancer Neg Hx    Stomach cancer Neg Hx     Social History   Socioeconomic History   Marital status: Married    Spouse name: [name]    Number of children: 0   Years of education: Not on file   Highest education level: Not on file  Occupational History   Occupation: Therapist, occupational: [employer]  Tobacco Use   Smoking status: Never    Smokeless tobacco: Never  Vaping Use   Vaping Use: Never used  Substance and Sexual Activity   Alcohol use: Yes    Comment: Alcohol once every few weeks/months   Drug use: No   Sexual activity: Not on file  Other Topics Concern   Not on file  Social History Narrative   Lives at home with spouse.    Social Determinants of Health   Financial Resource Strain: Not on file  Food Insecurity: Not on file  Transportation Needs: Not on file  Physical Activity: Not on file  Stress: Not on file  Social Connections: Not on file  Intimate Partner Violence: Not on file   Review of Systems  Constitutional:  Negative for fatigue.       Wears seat belt  HENT:  Negative for dental problem, hearing loss and tinnitus.        Keeps up with dentist  Eyes:  Negative for visual disturbance.        No diplopia or unilateral vision loss  Respiratory:  Negative for cough, chest tightness and shortness of breath.   Cardiovascular:  Negative for chest pain, palpitations and leg swelling.  Gastrointestinal:  Negative for blood in stool and constipation.       No heartburn  Endocrine: Negative for polydipsia and polyuria.  Genitourinary:  Positive for urgency.       Rarely uses sildenafil Urine flow is good  Musculoskeletal:  Negative for back pain and joint swelling.       Chronic knee, back and shoulder pain Muscle relaxer helps him sleep  Skin:  Negative for rash.  Allergic/Immunologic: Positive for environmental allergies. Negative for immunocompromised state.       Allergy control better with adding fexofenadine  Neurological:  Negative for dizziness, syncope, light-headedness and headaches.  Hematological:  Negative for adenopathy. Does not bruise/bleed easily.  Psychiatric/Behavioral:  Negative for dysphoric mood and sleep disturbance. The patient is not nervous/anxious.       Objective:   Physical Exam Constitutional:      Appearance: Normal appearance.  HENT:     Right Ear:  Tympanic membrane and ear canal normal.     Left Ear: Tympanic membrane and ear canal normal.     Mouth/Throat:     Pharynx: No oropharyngeal exudate or posterior oropharyngeal erythema.  Eyes:     Conjunctiva/sclera: Conjunctivae normal.     Pupils: Pupils are equal, round, and reactive to light.  Cardiovascular:     Rate and Rhythm: Normal rate and regular rhythm.     Pulses: Normal pulses.  Heart sounds: No murmur heard.   No gallop.  Pulmonary:     Effort: Pulmonary effort is normal.     Breath sounds: Normal breath sounds. No wheezing or rales.  Abdominal:     Palpations: Abdomen is soft.     Tenderness: There is no abdominal tenderness.  Musculoskeletal:     Cervical back: Neck supple.     Right lower leg: No edema.     Left lower leg: No edema.  Lymphadenopathy:     Cervical: No cervical adenopathy.  Skin:    General: Skin is warm.     Findings: No rash.  Neurological:     General: No focal deficit present.     Mental Status: He is alert and oriented to person, place, and time.  Psychiatric:        Mood and Affect: Mood normal.        Behavior: Behavior normal.           Assessment & Plan:

## 2020-10-15 NOTE — Assessment & Plan Note (Signed)
Does well with the tamsulosin

## 2020-10-15 NOTE — Assessment & Plan Note (Signed)
BP Readings from Last 3 Encounters:  10/15/20 116/80  07/25/20 136/70  07/03/20 135/86   Meds changed due to diastolic dysfunction Doing well on lisinopril/torsende

## 2020-10-15 NOTE — Assessment & Plan Note (Signed)
Healthy Colon due 2030 Will defer PSA till next year Flu vaccine soon COVID bivalent later this year Had 2 shingrix Continues to exercise regularly

## 2020-10-15 NOTE — Assessment & Plan Note (Signed)
Borderline high TSH doesn't require action Will rehceck next year

## 2020-12-22 ENCOUNTER — Other Ambulatory Visit: Payer: Self-pay | Admitting: Family

## 2020-12-22 DIAGNOSIS — R002 Palpitations: Secondary | ICD-10-CM

## 2020-12-24 ENCOUNTER — Other Ambulatory Visit: Payer: Self-pay

## 2020-12-24 MED ORDER — POTASSIUM CHLORIDE ER 10 MEQ PO TBCR: 10.0000 meq | EXTENDED_RELEASE_TABLET | Freq: Every day | ORAL | 1 refills | Status: DC

## 2020-12-27 ENCOUNTER — Other Ambulatory Visit: Payer: Self-pay

## 2020-12-27 ENCOUNTER — Ambulatory Visit (INDEPENDENT_AMBULATORY_CARE_PROVIDER_SITE_OTHER): Payer: BC Managed Care – PPO | Admitting: Internal Medicine

## 2020-12-27 ENCOUNTER — Encounter: Payer: Self-pay | Admitting: Internal Medicine

## 2020-12-27 DIAGNOSIS — J0110 Acute frontal sinusitis, unspecified: Secondary | ICD-10-CM | POA: Insufficient documentation

## 2020-12-27 MED ORDER — AMOXICILLIN 500 MG PO TABS: 1000.0000 mg | ORAL_TABLET | Freq: Two times a day (BID) | ORAL | 0 refills | Status: AC

## 2020-12-27 NOTE — Progress Notes (Signed)
Subjective:    Patient ID: Jason [name], male    DOB: 03-Jul-1955, 65 y.o.   MRN: [id number]  HPI Here due to persistent respiratory symptoms This visit occurred during the SARS-CoV-2 public health emergency.  Safety protocols were in place, including screening questions prior to the visit, additional usage of staff PPE, and extensive cleaning of exam room while observing appropriate contact time as indicated for disinfecting solutions.   Started with symptoms at least 3 weeks Frontal headache (sinus), sore throat ----bad enough at first to spend the first 2 days in bed Did improve in time to go back to work---rough at times It has hung on since then  Now having left ear "helicopter sounds" --not really painful. Intermittent thumping sound Occasional sinus headaches Some cough---mostly clear sputum (had been dark at first) No SOB No fever  COVID test was negative recently (with the persistent symptoms)  Current Outpatient Medications on File Prior to Visit  Medication Sig Dispense Refill   acetaminophen (TYLENOL) 325 MG tablet Take 650 mg by mouth 2 (two) times daily as needed.     amoxicillin (AMOXIL) 500 MG capsule Take 4 tablets by mouth prior to dental procedures.     Ascorbic Acid (VITA-C PO) Take 1,000 mg by mouth daily.     CRANBERRY PO Take 1 tablet by mouth daily.     cyclobenzaprine (FLEXERIL) 10 MG tablet TAKE 1 TABLET BY MOUTH EVERY DAY AT NIGHT AS NEEDED 90 tablet 1   fexofenadine (ALLEGRA) 180 MG tablet Take 180 mg by mouth daily.     levothyroxine (SYNTHROID) 88 MCG tablet TAKE 1 TABLET BY MOUTH EVERY DAY 90 tablet 3   lisinopril (ZESTRIL) 20 MG tablet Take 1 tablet (20 mg total) by mouth daily. 30 tablet 4   loratadine (CLARITIN) 10 MG tablet Take 10 mg by mouth daily. Can take 2 tabs if needed     MAGNESIUM OXIDE PO Take by mouth daily. Pt unsure of dose     metoprolol succinate (TOPROL-XL) 100 MG 24 hr tablet TAKE 1 TABLET BY MOUTH DAILY. TAKE WITH OR  IMMEDIATELY FOLLOWING A MEAL. 90 tablet 0   Multiple Vitamin (MULTIVITAMIN) tablet Take 1 tablet by mouth daily.     potassium chloride (KLOR-CON) 10 MEQ tablet Take 1 tablet (10 mEq total) by mouth daily. 30 tablet 1   sildenafil (REVATIO) 20 MG tablet Take 3-5 tablets (60-100 mg total) by mouth daily as needed. 50 tablet 11   tamsulosin (FLOMAX) 0.4 MG CAPS capsule TAKE 1 CAPSULE BY MOUTH EVERY DAY 90 capsule 3   rosuvastatin (CRESTOR) 5 MG tablet Take 1 tablet (5 mg total) by mouth daily. 30 tablet 5   spironolactone (ALDACTONE) 25 MG tablet Take 0.5 tablets (12.5 mg total) by mouth daily. 15 tablet 5   torsemide (DEMADEX) 10 MG tablet Take 1 tablet (10 mg total) by mouth daily. 30 tablet 6   No current facility-administered medications on file prior to visit.    No Known Allergies  Past Medical History:  Diagnosis Date   Allergy    Arthritis    osteoarthritis   GERD (gastroesophageal reflux disease)    Hypertension    Thyroid disease    Hyperthyroidism s/p radioactive iodine ablation    Past Surgical History:  Procedure Laterality Date   CARDIAC CATHETERIZATION  03-2000   LEFT HEART CATH AND CORONARY ANGIOGRAPHY Left 03/20/2020   Procedure: LEFT HEART CATH AND CORONARY ANGIOGRAPHY;  Surgeon: Nelva Bush, MD;  Location: [facility];  Service: Cardiovascular;  Laterality: Left;   ROTATOR CUFF REPAIR  10/12   Dr [name] Heck   TONSILLECTOMY     TOTAL HIP ARTHROPLASTY Right 02/24/2009   TOTAL HIP ARTHROPLASTY Left 06/2016   Dr Harlow Mares    Family History  Problem Relation Age of Onset   Hypertension Mother    Stroke Mother    Gout Father    Heart attack Father 95   Heart disease Sister    Colon cancer Neg Hx    Esophageal cancer Neg Hx    Rectal cancer Neg Hx    Stomach cancer Neg Hx     Social History   Socioeconomic History   Marital status: Married    Spouse name: [name]    Number of children: 0   Years of education: Not on file   Highest education  level: Not on file  Occupational History   Occupation: Therapist, occupational: [employer]  Tobacco Use   Smoking status: Never   Smokeless tobacco: Never  Vaping Use   Vaping Use: Never used  Substance and Sexual Activity   Alcohol use: Yes    Comment: Alcohol once every few weeks/months   Drug use: No   Sexual activity: Not on file  Other Topics Concern   Not on file  Social History Narrative   Lives at home with spouse.    Social Determinants of Health   Financial Resource Strain: Not on file  Food Insecurity: Not on file  Transportation Needs: Not on file  Physical Activity: Not on file  Stress: Not on file  Social Connections: Not on file  Intimate Partner Violence: Not on file   Review of Systems No vomiting Some stomach upset/nausea/diarrhea---brief Eating okay now Hearing seems okay    Objective:   Physical Exam Constitutional:      Appearance: Normal appearance.  HENT:     Head:     Comments: No frontal or maxillary tenderness    Right Ear: Tympanic membrane and ear canal normal.     Left Ear: Tympanic membrane normal.     Nose:     Comments: Mild congestion    Mouth/Throat:     Pharynx: No oropharyngeal exudate or posterior oropharyngeal erythema.  Pulmonary:     Effort: Pulmonary effort is normal.     Breath sounds: Normal breath sounds. No wheezing or rales.  Musculoskeletal:     Cervical back: Neck supple.  Lymphadenopathy:     Cervical: No cervical adenopathy.  Neurological:     Mental Status: He is alert.           Assessment & Plan:

## 2020-12-27 NOTE — Assessment & Plan Note (Signed)
Had typical viral symptoms (didn't test for COVID till just this week--negative) Some sinus symptoms all along--and now the ear symptoms are suggestive Discussed analgesics Will try amoxil

## 2021-01-02 ENCOUNTER — Ambulatory Visit: Payer: BC Managed Care – PPO | Admitting: Internal Medicine

## 2021-01-17 ENCOUNTER — Other Ambulatory Visit: Payer: Self-pay | Admitting: Internal Medicine

## 2021-01-21 MED ORDER — SPIRONOLACTONE 25 MG PO TABS: 12.5000 mg | ORAL_TABLET | Freq: Every day | ORAL | 5 refills | Status: DC

## 2021-01-21 MED ORDER — ROSUVASTATIN CALCIUM 5 MG PO TABS: 5.0000 mg | ORAL_TABLET | Freq: Every day | ORAL | 5 refills | Status: DC

## 2021-01-24 ENCOUNTER — Ambulatory Visit (INDEPENDENT_AMBULATORY_CARE_PROVIDER_SITE_OTHER): Payer: BC Managed Care – PPO | Admitting: Internal Medicine

## 2021-01-24 ENCOUNTER — Encounter: Payer: Self-pay | Admitting: Internal Medicine

## 2021-01-24 ENCOUNTER — Other Ambulatory Visit: Payer: Self-pay | Admitting: Cardiovascular Disease

## 2021-01-24 ENCOUNTER — Other Ambulatory Visit: Payer: Self-pay

## 2021-01-24 VITALS — BP 138/80 | HR 76 | Ht 69.0 in | Wt 197.0 lb

## 2021-01-24 DIAGNOSIS — I471 Supraventricular tachycardia: Secondary | ICD-10-CM | POA: Diagnosis not present

## 2021-01-24 DIAGNOSIS — I10 Essential (primary) hypertension: Secondary | ICD-10-CM | POA: Diagnosis not present

## 2021-01-24 DIAGNOSIS — I493 Ventricular premature depolarization: Secondary | ICD-10-CM

## 2021-01-24 DIAGNOSIS — R079 Chest pain, unspecified: Secondary | ICD-10-CM | POA: Diagnosis not present

## 2021-01-24 DIAGNOSIS — R002 Palpitations: Secondary | ICD-10-CM

## 2021-01-24 DIAGNOSIS — E785 Hyperlipidemia, unspecified: Secondary | ICD-10-CM

## 2021-01-24 DIAGNOSIS — Z79899 Other long term (current) drug therapy: Secondary | ICD-10-CM

## 2021-01-24 DIAGNOSIS — I4729 Other ventricular tachycardia: Secondary | ICD-10-CM

## 2021-01-24 NOTE — Patient Instructions (Signed)
Medication Instructions:   Your physician recommends that you continue on your current medications as directed. Please refer to the Current Medication list given to you today.  *If you need a refill on your cardiac medications before your next appointment, please call your pharmacy*   Lab Work:  Your physician recommends that you return for lab work next week in the morning for FASTING Lipid and CMET.  Testing/Procedures:  None ordered   Follow-Up: At [facility], you and your health needs are our priority.  As part of our continuing mission to provide you with exceptional heart care, we have created designated Provider Care Teams.  These Care Teams include your primary Cardiologist (physician) and Advanced Practice Providers (APPs -  Physician Assistants and Nurse Practitioners) who all work together to provide you with the care you need, when you need it.  We recommend signing up for the patient portal called "MyChart".  Sign up information is provided on this After Visit Summary.  MyChart is used to connect with patients for Virtual Visits (Telemedicine).  Patients are able to view lab/test results, encounter notes, upcoming appointments, etc.  Non-urgent messages can be sent to your provider as well.   To learn more about what you can do with MyChart, go to [email or web address].    Your next appointment:   6 month(s)  The format for your next appointment:   In Person  Provider:   You may see Nelva Bush, MD or one of the following Advanced Practice Providers on your designated Care Team:   Murray Hodgkins, NP Christell Faith, PA-C Cadence Kathlen Mody, [place]

## 2021-01-24 NOTE — Progress Notes (Signed)
Follow-up Outpatient Visit Date: 01/24/2021  Primary Care Provider: Venia Carbon, MD [place] [address]  Chief Complaint: Follow-up palpitations  HPI:  [name] is a 65 y.o. male with history of hypertension, thyroid disease, GERD, and osteoarthritis, presenting for follow-up of palpitations.  Today, Mr. [name] reports that he has been feeling quite well without palpitations, chest pain, shortness of breath, lightheadedness, or edema.  Home blood pressures have typically been well controlled with only 1 reading up to 144/89 in the setting of an upper respiratory tract infection for which she was taking cold medications.  In hindsight, he wonders if he may have had COVID at that time.  --------------------------------------------------------------------------------------------------  Past Medical History:  Diagnosis Date   Allergy    Arthritis    osteoarthritis   GERD (gastroesophageal reflux disease)    Hypertension    Thyroid disease    Hyperthyroidism s/p radioactive iodine ablation   Past Surgical History:  Procedure Laterality Date   CARDIAC CATHETERIZATION  03-2000   LEFT HEART CATH AND CORONARY ANGIOGRAPHY Left 03/20/2020   Procedure: LEFT HEART CATH AND CORONARY ANGIOGRAPHY;  Surgeon: Nelva Bush, MD;  Location: [facility];  Service: Cardiovascular;  Laterality: Left;   ROTATOR CUFF REPAIR  10/12   Dr [name] Heck   TONSILLECTOMY     TOTAL HIP ARTHROPLASTY Right 02/24/2009   TOTAL HIP ARTHROPLASTY Left 06/2016   Dr Harlow Mares    Current Meds  Medication Sig   acetaminophen (TYLENOL) 325 MG tablet Take 650 mg by mouth 2 (two) times daily as needed.   amoxicillin (AMOXIL) 500 MG capsule Take 4 tablets by mouth prior to dental procedures.   Ascorbic Acid (VITA-C PO) Take 1,000 mg by mouth daily.   cetirizine (ZYRTEC) 10 MG tablet Take 10 mg by mouth daily.   CRANBERRY PO Take 1 tablet by mouth daily.   cyclobenzaprine (FLEXERIL)  10 MG tablet TAKE 1 TABLET BY MOUTH EVERY DAY AT NIGHT AS NEEDED   levothyroxine (SYNTHROID) 88 MCG tablet TAKE 1 TABLET BY MOUTH EVERY DAY   lisinopril (ZESTRIL) 20 MG tablet TAKE 1 TABLET BY MOUTH EVERY DAY   MAGNESIUM OXIDE PO Take by mouth daily. Pt unsure of dose   metoprolol succinate (TOPROL-XL) 100 MG 24 hr tablet TAKE 1 TABLET BY MOUTH DAILY. TAKE WITH OR IMMEDIATELY FOLLOWING A MEAL.   Multiple Vitamin (MULTIVITAMIN) tablet Take 1 tablet by mouth daily.   potassium chloride (KLOR-CON) 10 MEQ tablet TAKE 1 TABLET BY MOUTH EVERY DAY   rosuvastatin (CRESTOR) 5 MG tablet Take 1 tablet (5 mg total) by mouth daily.   sildenafil (REVATIO) 20 MG tablet Take 3-5 tablets (60-100 mg total) by mouth daily as needed.   spironolactone (ALDACTONE) 25 MG tablet Take 0.5 tablets (12.5 mg total) by mouth daily.   tamsulosin (FLOMAX) 0.4 MG CAPS capsule TAKE 1 CAPSULE BY MOUTH EVERY DAY   torsemide (DEMADEX) 10 MG tablet Take 1 tablet (10 mg total) by mouth daily.    Allergies: Patient has no known allergies.  Social History   Tobacco Use   Smoking status: Never   Smokeless tobacco: Never  Vaping Use   Vaping Use: Never used  Substance Use Topics   Alcohol use: Not Currently    Comment: Alcohol once every few weeks/months   Drug use: No    Family History  Problem Relation Age of Onset   Hypertension Mother    Stroke Mother    Gout Father  Heart attack Father 22   Heart disease Sister    Colon cancer Neg Hx    Esophageal cancer Neg Hx    Rectal cancer Neg Hx    Stomach cancer Neg Hx     Review of Systems: A 12-system review of systems was performed and was negative except as noted in the HPI.  --------------------------------------------------------------------------------------------------  Physical Exam: BP 138/80 (BP Location: Left Arm, Patient Position: Sitting, Cuff Size: Large)   Pulse 76   Ht 5\' 9"  (1.753 m)   Wt 197 lb (89.4 kg)   SpO2 98%   BMI 29.09 kg/m    General:  NAD. Neck: No JVD or HJR. Lungs: Clear to auscultation bilaterally without wheezes or crackles. Heart: Regular rate and rhythm without murmurs, rubs, or gallops. Abdomen: Soft, nontender, nondistended. Extremities: No lower extremity edema.  EKG: Normal sinus rhythm without abnormality.  Lab Results  Component Value Date   WBC 7.6 03/16/2020   HGB 15.5 03/16/2020   HCT 45.9 03/16/2020   MCV 92.4 03/16/2020   PLT 269 03/16/2020    Lab Results  Component Value Date   NA 139 08/14/2020   K 3.7 08/14/2020   CL 103 08/14/2020   CO2 28 08/14/2020   BUN 28 (H) 08/14/2020   CREATININE 0.98 08/14/2020   GLUCOSE 92 08/14/2020   ALT 27 07/25/2020    Lab Results  Component Value Date   CHOL 163 07/25/2020   HDL 42 07/25/2020   LDLCALC 106 (H) 07/25/2020   TRIG 76 07/25/2020   CHOLHDL 3.9 07/25/2020    --------------------------------------------------------------------------------------------------  ASSESSMENT AND PLAN: Palpitations, PSVT, and NSVT: Symptoms have been quiescent with low-dose metoprolol.  Prior cath showed absence of CAD.  Echo was without significant structural abnormality.  Continue current dose of metoprolol.  No further work-up recommended at this time.  Chest pain: Symptoms have resolved with catheterization earlier this year showing no CAD.  Continue metoprolol.  Hypertension: Blood pressure upper normal today, typically better at home.  Continue current medications.  We will check kidney function and electrolytes at the patient's earliest convenience in the setting of spironolactone use.  Hyperlipidemia: Low-dose rosuvastatin started by Lorenso Quarry, PA, in June with LDL of 106.  Mr. Colston is tolerating this reasonably well.  We will plan to obtain a fasting lipid panel and LFTs at his convenience to assess response.  Given absence of established ASCVD, discontinuation of statin therapy could be considered in the future, particularly  if he is able to lower his LDL with lifestyle modifications.  Follow-up: Return to clinic in 6 months.  Nelva Bush, MD 01/24/2021 4:34 PM

## 2021-01-25 ENCOUNTER — Encounter: Payer: Self-pay | Admitting: Internal Medicine

## 2021-01-25 DIAGNOSIS — I4729 Other ventricular tachycardia: Secondary | ICD-10-CM | POA: Insufficient documentation

## 2021-01-25 DIAGNOSIS — E785 Hyperlipidemia, unspecified: Secondary | ICD-10-CM | POA: Insufficient documentation

## 2021-01-25 DIAGNOSIS — I471 Supraventricular tachycardia: Secondary | ICD-10-CM | POA: Insufficient documentation

## 2021-01-29 ENCOUNTER — Other Ambulatory Visit: Payer: Self-pay

## 2021-01-29 ENCOUNTER — Other Ambulatory Visit (INDEPENDENT_AMBULATORY_CARE_PROVIDER_SITE_OTHER): Payer: BC Managed Care – PPO

## 2021-01-29 DIAGNOSIS — Z79899 Other long term (current) drug therapy: Secondary | ICD-10-CM

## 2021-01-29 DIAGNOSIS — I10 Essential (primary) hypertension: Secondary | ICD-10-CM

## 2021-01-29 DIAGNOSIS — E785 Hyperlipidemia, unspecified: Secondary | ICD-10-CM

## 2021-01-30 LAB — COMPREHENSIVE METABOLIC PANEL
ALT: 33 IU/L (ref 0–44)
AST: 34 IU/L (ref 0–40)
Albumin/Globulin Ratio: 1.6 (ref 1.2–2.2)
Albumin: 4.4 g/dL (ref 3.8–4.8)
Alkaline Phosphatase: 99 IU/L (ref 44–121)
BUN/Creatinine Ratio: 17 (ref 10–24)
BUN: 16 mg/dL (ref 8–27)
Bilirubin Total: 1.1 mg/dL (ref 0.0–1.2)
CO2: 27 mmol/L (ref 20–29)
Calcium: 10.1 mg/dL (ref 8.6–10.2)
Chloride: 99 mmol/L (ref 96–106)
Creatinine, Ser: 0.96 mg/dL (ref 0.76–1.27)
Globulin, Total: 2.8 g/dL (ref 1.5–4.5)
Glucose: 96 mg/dL (ref 70–99)
Potassium: 4.7 mmol/L (ref 3.5–5.2)
Sodium: 140 mmol/L (ref 134–144)
Total Protein: 7.2 g/dL (ref 6.0–8.5)
eGFR: 89 mL/min/{1.73_m2} (ref >59)

## 2021-01-30 LAB — LIPID PANEL
Chol/HDL Ratio: 2.6 ratio (ref 0.0–5.0)
Cholesterol, Total: 125 mg/dL (ref 100–199)
HDL: 49 mg/dL (ref >39)
LDL Chol Calc (NIH): 62 mg/dL (ref 0–99)
Triglycerides: 69 mg/dL (ref 0–149)
VLDL Cholesterol Cal: 14 mg/dL (ref 5–40)

## 2021-02-04 ENCOUNTER — Other Ambulatory Visit: Payer: Self-pay

## 2021-02-04 MED ORDER — TORSEMIDE 10 MG PO TABS: 10.0000 mg | ORAL_TABLET | Freq: Every day | ORAL | 5 refills | Status: DC

## 2021-02-15 ENCOUNTER — Other Ambulatory Visit: Payer: Self-pay | Admitting: Internal Medicine

## 2021-02-16 ENCOUNTER — Other Ambulatory Visit: Payer: Self-pay | Admitting: Internal Medicine

## 2021-02-24 ENCOUNTER — Encounter: Payer: Self-pay | Admitting: Internal Medicine

## 2021-03-04 DIAGNOSIS — M75102 Unspecified rotator cuff tear or rupture of left shoulder, not specified as traumatic: Secondary | ICD-10-CM | POA: Diagnosis not present

## 2021-03-11 ENCOUNTER — Emergency Department: Payer: BC Managed Care – PPO

## 2021-03-11 DIAGNOSIS — M75102 Unspecified rotator cuff tear or rupture of left shoulder, not specified as traumatic: Secondary | ICD-10-CM | POA: Diagnosis not present

## 2021-03-11 DIAGNOSIS — I10 Essential (primary) hypertension: Secondary | ICD-10-CM | POA: Diagnosis not present

## 2021-03-11 DIAGNOSIS — E039 Hypothyroidism, unspecified: Secondary | ICD-10-CM | POA: Diagnosis not present

## 2021-03-11 DIAGNOSIS — Z79899 Other long term (current) drug therapy: Secondary | ICD-10-CM | POA: Diagnosis not present

## 2021-03-11 DIAGNOSIS — Z96643 Presence of artificial hip joint, bilateral: Secondary | ICD-10-CM | POA: Insufficient documentation

## 2021-03-11 DIAGNOSIS — M25512 Pain in left shoulder: Secondary | ICD-10-CM | POA: Diagnosis not present

## 2021-03-11 DIAGNOSIS — R911 Solitary pulmonary nodule: Secondary | ICD-10-CM | POA: Diagnosis not present

## 2021-03-11 DIAGNOSIS — Z955 Presence of coronary angioplasty implant and graft: Secondary | ICD-10-CM | POA: Diagnosis not present

## 2021-03-11 DIAGNOSIS — R079 Chest pain, unspecified: Secondary | ICD-10-CM | POA: Diagnosis not present

## 2021-03-11 DIAGNOSIS — M129 Arthropathy, unspecified: Secondary | ICD-10-CM | POA: Diagnosis not present

## 2021-03-11 LAB — CBC
HCT: 41 % (ref 39.0–52.0)
Hemoglobin: 14 g/dL (ref 13.0–17.0)
MCH: 32.3 pg (ref 26.0–34.0)
MCHC: 34.1 g/dL (ref 30.0–36.0)
MCV: 94.5 fL (ref 80.0–100.0)
Platelets: 270 10*3/uL (ref 150–400)
RBC: 4.34 MIL/uL (ref 4.22–5.81)
RDW: 12.7 % (ref 11.5–15.5)
WBC: 12.4 10*3/uL — ABNORMAL HIGH (ref 4.0–10.5)
nRBC: 0 % (ref 0.0–0.2)

## 2021-03-11 LAB — BASIC METABOLIC PANEL
Anion gap: 7 (ref 5–15)
BUN: 21 mg/dL (ref 8–23)
CO2: 28 mmol/L (ref 22–32)
Calcium: 9 mg/dL (ref 8.9–10.3)
Chloride: 98 mmol/L (ref 98–111)
Creatinine, Ser: 0.9 mg/dL (ref 0.61–1.24)
GFR, Estimated: >60 mL/min (ref >60)
Glucose, Bld: 125 mg/dL — ABNORMAL HIGH (ref 70–99)
Potassium: 3.2 mmol/L — ABNORMAL LOW (ref 3.5–5.1)
Sodium: 133 mmol/L — ABNORMAL LOW (ref 135–145)

## 2021-03-11 LAB — TROPONIN I (HIGH SENSITIVITY)
Troponin I (High Sensitivity): 8 ng/L (ref <18)
Troponin I (High Sensitivity): 6 ng/L (ref <18)

## 2021-03-11 NOTE — ED Triage Notes (Signed)
Pt here for L arm and shoulder pain and has been seen by Dr Sharlet Salina. Pt also has a hx of rapid heart rate and cardiac cath about a year . Pt has shoulder pain x 3 weeks. Pt has done exercises for shoulder but not sure it is orthopedic

## 2021-03-12 ENCOUNTER — Emergency Department
Admission: EM | Admit: 2021-03-12 | Discharge: 2021-03-12 | Disposition: A | Discharge status: Home / Self Care | Payer: BC Managed Care – PPO | Attending: Emergency Medicine | Admitting: Emergency Medicine

## 2021-03-12 DIAGNOSIS — M12812 Other specific arthropathies, not elsewhere classified, left shoulder: Secondary | ICD-10-CM

## 2021-03-12 DIAGNOSIS — M129 Arthropathy, unspecified: Secondary | ICD-10-CM | POA: Diagnosis not present

## 2021-03-12 DIAGNOSIS — M25512 Pain in left shoulder: Secondary | ICD-10-CM

## 2021-03-12 MED ORDER — OXYCODONE-ACETAMINOPHEN 5-325 MG PO TABS
1.0000 | ORAL_TABLET | Freq: Once | ORAL | Status: AC
  Administered 2021-03-12: 1 via ORAL
  Filled 2021-03-12: qty 1

## 2021-03-12 MED ORDER — OXYCODONE-ACETAMINOPHEN 5-325 MG PO TABS
1.0000 | ORAL_TABLET | ORAL | 0 refills | Status: DC | PRN
Start: 2021-03-12 — End: 2021-04-02

## 2021-03-12 MED ORDER — MELOXICAM 15 MG PO TABS: 15.0000 mg | ORAL_TABLET | Freq: Every day | ORAL | 0 refills | Status: DC

## 2021-03-12 NOTE — ED Provider Notes (Signed)
[facility] Provider Note    Event Date/Time   First MD Initiated Contact with Patient 03/12/21 0034     (approximate)   History   Arm Pain (Seen by Dr Sharlet Salina for orthopedic visit for L arm and shoulder )   HPI  Jason [name] is a 66 y.o. male who presents to the ED from home with a chief complaint left shoulder pain.  Patient has had chronic left shoulder pain, seen by orthopedics and diagnosed with rotator cuff injury.  Scheduled for outpatient MRI.  Currently taking OTC Tylenol and ibuprofen for pain.  Pain exacerbated last evening.  Patient sought evaluation in the ER as he has a history of rapid heart rate status post cardiac cath approximately 1 year ago.  Denies fever, cough, chest pain, shortness of breath, abdominal pain, nausea, vomiting or dizziness.     Past Medical History   Past Medical History:  Diagnosis Date   Allergy    Arthritis    osteoarthritis   GERD (gastroesophageal reflux disease)    Hypertension    Thyroid disease    Hyperthyroidism s/p radioactive iodine ablation     Active Problem List   Patient Active Problem List   Diagnosis Date Noted   PSVT (paroxysmal supraventricular tachycardia) ([place]) 01/25/2021   NSVT (nonsustained ventricular tachycardia) 01/25/2021   Hyperlipidemia 01/25/2021   Acute non-recurrent frontal sinusitis 12/27/2020   PVC's (premature ventricular contractions) 03/15/2020   Palpitations 03/14/2020   BPH without obstruction/lower urinary tract symptoms 11/07/2013   Routine general medical examination at a health care facility 06/24/2010   Hypothyroidism 06/19/2009   OSTEOARTHRITIS 12/19/2008   BACK PAIN, LUMBAR 02/26/2007   Essential hypertension, benign 09/17/2006   ALLERGIC RHINITIS 09/17/2006   GERD 09/17/2006     Past Surgical History   Past Surgical History:  Procedure Laterality Date   CARDIAC CATHETERIZATION  03-2000   LEFT HEART CATH AND CORONARY ANGIOGRAPHY Left 03/20/2020    Procedure: LEFT HEART CATH AND CORONARY ANGIOGRAPHY;  Surgeon: Nelva Bush, MD;  Location: [place] [facility];  Service: Cardiovascular;  Laterality: Left;   ROTATOR CUFF REPAIR  10/12   Dr [name] Heck   TONSILLECTOMY     TOTAL HIP ARTHROPLASTY Right 02/24/2009   TOTAL HIP ARTHROPLASTY Left 06/2016   Dr Harlow Mares     Home Medications   Prior to Admission medications   Medication Sig Start Date End Date Taking? Authorizing Provider  meloxicam (MOBIC) 15 MG tablet Take 1 tablet (15 mg total) by mouth daily. 03/12/21  Yes Paulette Blanch, MD  oxyCODONE-acetaminophen (PERCOCET/ROXICET) 5-325 MG tablet Take 1 tablet by mouth every 4 (four) hours as needed for severe pain. 03/12/21  Yes Paulette Blanch, MD  acetaminophen (TYLENOL) 325 MG tablet Take 650 mg by mouth 2 (two) times daily as needed.    [provider]  amoxicillin (AMOXIL) 500 MG capsule Take 4 tablets by mouth prior to dental procedures. 07/12/20   [provider]  Ascorbic Acid (VITA-C PO) Take 1,000 mg by mouth daily.    [provider]  cetirizine (ZYRTEC) 10 MG tablet Take 10 mg by mouth daily.    [provider]  CRANBERRY PO Take 1 tablet by mouth daily.    [provider]  cyclobenzaprine (FLEXERIL) 10 MG tablet TAKE 1 TABLET BY MOUTH EVERY DAY AT NIGHT AS NEEDED 02/18/21   Venia Carbon, MD  fexofenadine (ALLEGRA) 180 MG tablet Take 180 mg by mouth daily. Patient not taking:  Reported on 01/24/2021    [provider]  levothyroxine (SYNTHROID) 88 MCG tablet TAKE 1 TABLET BY MOUTH EVERY DAY 08/09/20   Viviana Simpler I, MD  lisinopril (ZESTRIL) 20 MG tablet TAKE 1 TABLET BY MOUTH EVERY DAY 01/24/21   Wellington Hampshire, MD  MAGNESIUM OXIDE PO Take by mouth daily. Pt unsure of dose    [provider]  metoprolol succinate (TOPROL-XL) 100 MG 24 hr tablet TAKE 1 TABLET BY MOUTH DAILY. TAKE WITH OR IMMEDIATELY FOLLOWING A MEAL. 12/24/20   End, Harrell Gave, MD  Multiple Vitamin  (MULTIVITAMIN) tablet Take 1 tablet by mouth daily.    [provider]  potassium chloride (KLOR-CON) 10 MEQ tablet TAKE 1 TABLET BY MOUTH EVERY DAY 02/15/21   End, Harrell Gave, MD  rosuvastatin (CRESTOR) 5 MG tablet Take 1 tablet (5 mg total) by mouth daily. 01/21/21 04/21/21  End, Harrell Gave, MD  sildenafil (REVATIO) 20 MG tablet Take 3-5 tablets (60-100 mg total) by mouth daily as needed. 10/05/18   Venia Carbon, MD  spironolactone (ALDACTONE) 25 MG tablet Take 0.5 tablets (12.5 mg total) by mouth daily. 01/21/21 04/21/21  End, Harrell Gave, MD  tamsulosin (FLOMAX) 0.4 MG CAPS capsule TAKE 1 CAPSULE BY MOUTH EVERY DAY 09/27/20   Viviana Simpler I, MD  torsemide (DEMADEX) 10 MG tablet Take 1 tablet (10 mg total) by mouth daily. 02/04/21   End, Harrell Gave, MD     Allergies  Patient has no known allergies.   Family History   Family History  Problem Relation Age of Onset   Hypertension Mother    Stroke Mother    Gout Father    Heart attack Father 75   Heart disease Sister    Colon cancer Neg Hx    Esophageal cancer Neg Hx    Rectal cancer Neg Hx    Stomach cancer Neg Hx      Physical Exam  Triage Vital Signs: ED Triage Vitals  Enc Vitals Group     BP 03/11/21 2044 (!) 148/94     Pulse Rate 03/11/21 2044 64     Resp 03/11/21 2044 16     Temp 03/11/21 2044 98 F (36.7 C)     Temp Source 03/11/21 2044 Oral     SpO2 03/11/21 2049 99 %     Weight 03/11/21 2050 190 lb (86.2 kg)     Height 03/11/21 2050 5\' 9"  (1.753 m)     Head Circumference --      Peak Flow --      Pain Score 03/11/21 2050 5     Pain Loc --      Pain Edu? --      Excl. in [place]? --     Updated Vital Signs: BP (!) 150/95 (BP Location: Right Arm)    Pulse 69    Temp 98 F (36.7 C) (Oral)    Resp 18    Ht 5\' 9"  (1.753 m)    Wt 86.2 kg    SpO2 99%    BMI 28.06 kg/m    General: Awake, no distress.  CV:  Good peripheral perfusion.  Resp:  Normal effort.  Abd:  No distention.  Other:  Left  shoulder limited range of motion secondary to pain.  No swelling in her upper arm to suggest a DVT.  2+ radial pulses.  Brisk, less than 5-second capillary refill.   ED Results / Procedures / Treatments  Labs (all labs ordered are listed, but only abnormal results are  displayed) Labs Reviewed  CBC - Abnormal; Notable for the following components:      Result Value   WBC 12.4 (*)    All other components within normal limits  BASIC METABOLIC PANEL - Abnormal; Notable for the following components:   Sodium 133 (*)    Potassium 3.2 (*)    Glucose, Bld 125 (*)    All other components within normal limits  TROPONIN I (HIGH SENSITIVITY)  TROPONIN I (HIGH SENSITIVITY)     EKG  ED ECG REPORT I, Izaan Kingbird J, the attending physician, personally viewed and interpreted this ECG.   Date: 03/12/2021  EKG Time: 2056  Rate: 70  Rhythm: normal sinus rhythm  Axis: Normal  Intervals:none  ST&T Change: Nonspecific    RADIOLOGY I have personally viewed the patient's chest x-ray as well as the radiology interpretation:  No acute cardiopulmonary process  Official radiology report(s): DG Chest 2 View  Result Date: 03/11/2021 CLINICAL DATA:  Chest pain EXAM: CHEST - 2 VIEW COMPARISON:  01/31/2020 FINDINGS: Small calcified lung nodules consistent with granuloma. Acute consolidation, pleural effusion or pneumothorax. Stable cardiomediastinal silhouette. IMPRESSION: No active cardiopulmonary disease. Electronically Signed   By: Donavan Foil M.D.   On: 03/11/2021 21:07     PROCEDURES:  Critical Care performed: No  Procedures   MEDICATIONS ORDERED IN ED: Medications  oxyCODONE-acetaminophen (PERCOCET/ROXICET) 5-325 MG per tablet 1 tablet (has no administration in time range)     IMPRESSION / MDM / ASSESSMENT AND PLAN / ED COURSE  I reviewed the triage vital signs and the nursing notes.                             66 year old male presenting with acute on chronic left shoulder pain.   Differential diagnosis includes but is not limited to musculoskeletal etiology, ACS, PE, DVT, etc.  I have personally reviewed patient's chart and see his orthopedic office visit from 03/04/2021 for rotator cuff tear; will be scheduled for outpatient MRI.  The patient is on the cardiac monitor to evaluate for evidence of arrhythmia and/or significant heart rate changes.  Laboratory results demonstrate mild hypokalemia, 2 sets of negative troponins.  Very low suspicion for PE, aortic dissection, DVT. Patient states he is on supplemental potassium.  Will place in sling, replace  800mg  Motrin 3 times daily that he is taking with Meloxicam, Percocet.  Patient works at [employer] of [employer]s; will provide work note to limit his lifting activities.  He will follow-up with his orthopedic doctor.  Strict return precautions given.  Patient and spouse verbalized understanding and agree with plan of care.      FINAL CLINICAL IMPRESSION(S) / ED DIAGNOSES   Final diagnoses:  Left rotator cuff tear arthropathy  Acute pain of left shoulder     Rx / DC Orders   ED Discharge Orders          Ordered    meloxicam (MOBIC) 15 MG tablet  Daily        03/12/21 0105    oxyCODONE-acetaminophen (PERCOCET/ROXICET) 5-325 MG tablet  Every 4 hours PRN        03/12/21 0105             Note:  This document was prepared using Dragon voice recognition software and may include unintentional dictation errors.   Paulette Blanch, MD 03/12/21 [phone]

## 2021-03-12 NOTE — Discharge Instructions (Signed)
You may take Meloxicam and Percocet as needed for pain.  Wear sling while at work.  Apply heat to affected area as needed for comfort.  Return to the ER for worsening symptoms, persistent vomiting, difficulty breathing or other concerns.

## 2021-03-15 ENCOUNTER — Other Ambulatory Visit: Payer: Self-pay | Admitting: Internal Medicine

## 2021-03-21 ENCOUNTER — Encounter: Payer: Self-pay | Admitting: Internal Medicine

## 2021-03-22 ENCOUNTER — Other Ambulatory Visit: Payer: Self-pay | Admitting: Internal Medicine

## 2021-03-22 DIAGNOSIS — R002 Palpitations: Secondary | ICD-10-CM

## 2021-03-26 DIAGNOSIS — M25612 Stiffness of left shoulder, not elsewhere classified: Secondary | ICD-10-CM | POA: Diagnosis not present

## 2021-03-26 DIAGNOSIS — M25512 Pain in left shoulder: Secondary | ICD-10-CM | POA: Diagnosis not present

## 2021-03-27 NOTE — Telephone Encounter (Signed)
Spoke to pt's wife and pt. Made appt for 03-29-21.

## 2021-03-29 ENCOUNTER — Other Ambulatory Visit: Payer: Self-pay

## 2021-03-29 ENCOUNTER — Encounter: Payer: Self-pay | Admitting: Internal Medicine

## 2021-03-29 ENCOUNTER — Ambulatory Visit (INDEPENDENT_AMBULATORY_CARE_PROVIDER_SITE_OTHER): Payer: BC Managed Care – PPO | Admitting: Internal Medicine

## 2021-03-29 DIAGNOSIS — M25512 Pain in left shoulder: Secondary | ICD-10-CM | POA: Insufficient documentation

## 2021-03-29 NOTE — Assessment & Plan Note (Signed)
Likely partial rotator cuff tear No impingement Ortho has him doing PT and out on disability for now On meloxicam 15mg  daily Using the oxycodone 5/325 nightly

## 2021-03-29 NOTE — Progress Notes (Signed)
Subjective:    Patient ID: Jason [name], male    DOB: [birth year], 66 y.o.   MRN: [id number]  HPI Here with wife to review shoulder injury and need for pain meds  Started with pain 12/23 No clear injury Felt it by scapula and in shoulder Radiates down arm some  Having a lot of pain Unable to work---due to severe pain by the end of the day Now on short term disability Doing PT with stretching, etc---has to be careful how much he does  Has only used the oxycodone at night--so he can sleep Is taking the meloxicam every morning  Current Outpatient Medications on File Prior to Visit  Medication Sig Dispense Refill   acetaminophen (TYLENOL) 325 MG tablet Take 650 mg by mouth 2 (two) times daily as needed.     amoxicillin (AMOXIL) 500 MG capsule Take 4 tablets by mouth prior to dental procedures.     Ascorbic Acid (VITA-C PO) Take 1,000 mg by mouth daily.     cetirizine (ZYRTEC) 10 MG tablet Take 10 mg by mouth daily.     CRANBERRY PO Take 1 tablet by mouth daily.     cyclobenzaprine (FLEXERIL) 10 MG tablet TAKE 1 TABLET BY MOUTH EVERY DAY AT NIGHT AS NEEDED 90 tablet 1   levothyroxine (SYNTHROID) 88 MCG tablet TAKE 1 TABLET BY MOUTH EVERY DAY 90 tablet 3   lisinopril (ZESTRIL) 20 MG tablet TAKE 1 TABLET BY MOUTH EVERY DAY 90 tablet 0   loratadine (CLARITIN) 10 MG tablet Take 10 mg by mouth 2 (two) times daily.     MAGNESIUM OXIDE PO Take by mouth daily. Pt unsure of dose     meloxicam (MOBIC) 15 MG tablet Take 1 tablet (15 mg total) by mouth daily. 7 tablet 0   metoprolol succinate (TOPROL-XL) 100 MG 24 hr tablet TAKE 1 TABLET BY MOUTH EVERY DAY WITH OR IMMEDIATELY FOLLOWING A MEAL 90 tablet 1   Multiple Vitamin (MULTIVITAMIN) tablet Take 1 tablet by mouth daily.     oxyCODONE-acetaminophen (PERCOCET/ROXICET) 5-325 MG tablet Take 1 tablet by mouth every 4 (four) hours as needed for severe pain. 30 tablet 0   potassium chloride (KLOR-CON) 10 MEQ tablet TAKE 1 TABLET  BY MOUTH EVERY DAY 30 tablet 4   rosuvastatin (CRESTOR) 5 MG tablet Take 1 tablet (5 mg total) by mouth daily. 30 tablet 5   sildenafil (REVATIO) 20 MG tablet Take 3-5 tablets (60-100 mg total) by mouth daily as needed. 50 tablet 11   spironolactone (ALDACTONE) 25 MG tablet Take 0.5 tablets (12.5 mg total) by mouth daily. 15 tablet 5   tamsulosin (FLOMAX) 0.4 MG CAPS capsule TAKE 1 CAPSULE BY MOUTH EVERY DAY 90 capsule 3   torsemide (DEMADEX) 10 MG tablet Take 1 tablet (10 mg total) by mouth daily. 30 tablet 5   No current facility-administered medications on file prior to visit.    No Known Allergies  Past Medical History:  Diagnosis Date   Allergy    Arthritis    osteoarthritis   GERD (gastroesophageal reflux disease)    Hypertension    Thyroid disease    Hyperthyroidism s/p radioactive iodine ablation    Past Surgical History:  Procedure Laterality Date   CARDIAC CATHETERIZATION  03-2000   LEFT HEART CATH AND CORONARY ANGIOGRAPHY Left 03/20/2020   Procedure: LEFT HEART CATH AND CORONARY ANGIOGRAPHY;  Surgeon: Nelva Bush, MD;  Location: [place] [facility];  Service: Cardiovascular;  Laterality: Left;   ROTATOR CUFF  REPAIR  10/12   Dr [name] Heck   TONSILLECTOMY     TOTAL HIP ARTHROPLASTY Right 02/24/2009   TOTAL HIP ARTHROPLASTY Left 06/2016   Dr Harlow Mares    Family History  Problem Relation Age of Onset   Hypertension Mother    Stroke Mother    Gout Father    Heart attack Father 9   Heart disease Sister    Colon cancer Neg Hx    Esophageal cancer Neg Hx    Rectal cancer Neg Hx    Stomach cancer Neg Hx     Social History   Socioeconomic History   Marital status: Married    Spouse name: [name]    Number of children: 0   Years of education: Not on file   Highest education level: Not on file  Occupational History   Occupation: Therapist, occupational: [employer]  Tobacco Use   Smoking status: Never   Smokeless tobacco:  Never  Vaping Use   Vaping Use: Never used  Substance and Sexual Activity   Alcohol use: Not Currently    Comment: Alcohol once every few weeks/months   Drug use: No   Sexual activity: Not on file  Other Topics Concern   Not on file  Social History Narrative   Lives at home with spouse.    Social Determinants of Health   Financial Resource Strain: Not on file  Food Insecurity: Not on file  Transportation Needs: Not on file  Physical Activity: Not on file  Stress: Not on file  Social Connections: Not on file  Intimate Partner Violence: Not on file   Review of Systems Appetite is fine Bowels have slowed--similar to past times on narcotics. Still goes--but not as predictable     Objective:   Physical Exam Constitutional:      Appearance: Normal appearance.  Musculoskeletal:     Comments: No right shoulder swelling Abduction actively to 45-50 degrees. Pain with passive abduction near 90 Clunk with internal rotation Reduced external rotation--mild  Neurological:     Mental Status: He is alert.           Assessment & Plan:

## 2021-04-01 ENCOUNTER — Encounter: Payer: Self-pay | Admitting: Internal Medicine

## 2021-04-02 MED ORDER — OXYCODONE-ACETAMINOPHEN 5-325 MG PO TABS: 1.0000 | ORAL_TABLET | ORAL | 0 refills | Status: DC | PRN

## 2021-04-02 NOTE — Telephone Encounter (Signed)
Pt came in office to drop off Disability and Leave paper work for Dr. Silvio Pate. It was place  in mail box

## 2021-04-03 DIAGNOSIS — M25612 Stiffness of left shoulder, not elsewhere classified: Secondary | ICD-10-CM | POA: Diagnosis not present

## 2021-04-03 DIAGNOSIS — M25512 Pain in left shoulder: Secondary | ICD-10-CM | POA: Diagnosis not present

## 2021-04-04 ENCOUNTER — Other Ambulatory Visit: Payer: Self-pay | Admitting: Orthopaedic Surgery

## 2021-04-04 ENCOUNTER — Other Ambulatory Visit (HOSPITAL_COMMUNITY): Payer: Self-pay | Admitting: Orthopaedic Surgery

## 2021-04-04 DIAGNOSIS — M75102 Unspecified rotator cuff tear or rupture of left shoulder, not specified as traumatic: Secondary | ICD-10-CM

## 2021-04-12 DIAGNOSIS — M25512 Pain in left shoulder: Secondary | ICD-10-CM | POA: Diagnosis not present

## 2021-04-12 DIAGNOSIS — M25612 Stiffness of left shoulder, not elsewhere classified: Secondary | ICD-10-CM | POA: Diagnosis not present

## 2021-04-16 ENCOUNTER — Encounter (HOSPITAL_COMMUNITY): Payer: Self-pay | Admitting: *Deleted

## 2021-04-16 ENCOUNTER — Other Ambulatory Visit: Payer: Self-pay

## 2021-04-16 NOTE — Progress Notes (Addendum)
PCP - Dr. Viviana Simpler Cardiologist - Dr. Harrell Gave End with [facility] EKG - 03/12/21 Chest x-ray - 03/11/21 ECHO - 07/19/20 Cardiac Cath - 03/20/20 CPAP - Denies DM - Denies Blood Thinner Instructions: Denies Aspirin Instructions: Denies COVID TEST- Not indicated  Anesthesia review: Yes cardiac history  -------------  SDW INSTRUCTIONS:  Your procedure is scheduled on Thursday February 23rd. Please report to [facility] Main Entrance "A" at 5:45 A.M., and check in at the Admitting office. Call this number if you have problems the morning of surgery: [phone]   You may drink clear liquids until 5:15am the morning of your surgery.   Clear liquids allowed are: Water, Non-Citrus Juices (without pulp), Carbonated Beverages, Clear Tea, Black Coffee Only, and Gatorade   Medications to take morning of surgery with a sip of water include:cetirizine (ZYRTEC),  levothyroxine (SYNTHROID), loratadine (CLARITIN), metoprolol succinate (TOPROL-XL), tamsulosin (FLOMAX) IF NEEDED: acetaminophen (TYLENOL), oxyCODONE-acetaminophen (PERCOCET/ROXICET)  As of today, STOP taking any Aspirin (unless otherwise instructed by your surgeon), Mobic, Aleve, Naproxen, Ibuprofen, Motrin, Advil, Goody's, BC's, all herbal medications, fish oil, and all vitamins.    The Morning of Surgery Do not wear jewelry Do not wear lotions, powders, colognes, or deodorant Do not bring valuables to the hospital. [facility] is not responsible for any belongings or valuables.  If you are a smoker, DO NOT Smoke 24 hours prior to surgery  If you wear a CPAP at night please bring your mask the morning of surgery   Remember that you must have someone to transport you home after your surgery, and remain with you for 24 hours if you are discharged the same day.  Please bring cases for contacts, glasses, hearing aids, dentures or bridgework because it cannot be worn into surgery.   Patients discharged the day of surgery will not be  allowed to drive home.   Please shower the NIGHT BEFORE/MORNING OF SURGERY (use antibacterial soap like DIAL soap if possible). Wear comfortable clothes the morning of surgery. Oral Hygiene is also important to reduce your risk of infection.  Remember - BRUSH YOUR TEETH THE MORNING OF SURGERY WITH YOUR REGULAR TOOTHPASTE  Patient denies shortness of breath, fever, cough and chest pain.

## 2021-04-17 NOTE — Progress Notes (Signed)
Anesthesia Chart Review: Same-day work-up  Patient is undergone thorough cardiology evaluation for chest pain as well as palpitations, PSVT, NSVT last seen by Dr. In 01/24/2021.  Per note, "Symptoms have been quiescent with low-dose metoprolol.  Prior cath showed absence of CAD.  Echo was without significant structural abnormality.  Continue current dose of metoprolol.  No further work-up recommended at this time."  39-month follow-up recommended.  Patient will need day of surgery labs and evaluation.  EKG 03/11/2021: Normal sinus rhythm.  Rate 70. Nonspecific ST abnormality  TTE 07/19/2020:  1. Left ventricular ejection fraction, by estimation, is 50 to 55%. The  left ventricle has low normal function. The left ventricle has no regional  wall motion abnormalities. There is mild left ventricular hypertrophy.  Left ventricular diastolic  parameters are consistent with Grade I diastolic dysfunction (impaired  relaxation). The average left ventricular global longitudinal strain is  -13.2 %. The global longitudinal strain is abnormal.   2. Right ventricular systolic function is low normal. The right  ventricular size is normal.   3. The mitral valve is normal in structure. No evidence of mitral valve  regurgitation.   4. The aortic valve is tricuspid. Aortic valve regurgitation is not  visualized.   Event monitor 04/25/2020: The patient was monitored for 14 days. The predominant rhythm was sinus with an average rate of 85 bpm (range 47 - 122 bpm in sinus). There were rare PAC's and occasional PVC's. There were 6 atrial runs lasting up to 10.8 seconds with a maximum rate of 154 bpm. There were 9 episodes of wide complex tachycardia lasting up to 8 beats with a maximum rate of 231 bpm. Episodes most likely represent NSVT, though some tracings could also reflect SVT with aberrancy. No sustained arrhythmia or prolonged pause was identified. Patient triggered events correspond to sinus rhythm with  PVC's.   Sinus rhythm with rare PAC's and occasional PVC's.  Brief runs of PSVT and NSVT noted, as detailed above.  Cath 03/20/2020: Conclusions: No angiographically significant coronary artery disease. Normal left ventricular contraction with mildly elevated filling pressures consistent with diastolic dysfunction.   Recommendations: Primary prevention of coronary artery disease. Continue blood pressure control as well as recently added metoprolol for management of incidentally noted PVCs.  If symptoms persist, ambulatory cardiac monitoring +/- echcardiography will need to be considered.   Wynonia Musty [facility] Stay [facility]/Anesthesiology Phone [phone] 04/17/2021 11:27 AM

## 2021-04-17 NOTE — Anesthesia Preprocedure Evaluation (Addendum)
Anesthesia Evaluation  Patient identified by MRN, date of birth, ID band Patient awake    Reviewed: Allergy & Precautions, NPO status , Patient's Chart, lab work & pertinent test results  Airway Mallampati: II  TM Distance: >3 FB Neck ROM: Full    Dental no notable dental hx.    Pulmonary neg pulmonary ROS,    Pulmonary exam normal        Cardiovascular hypertension, Pt. on medications and Pt. on home beta blockers  Rhythm:Regular Rate:Normal     Neuro/Psych Anxiety With claustrophobia, unable to remain still with prior imaging attemptsnegative neurological ROS     GI/Hepatic Neg liver ROS, GERD  ,  Endo/Other  Hypothyroidism   Renal/GU negative Renal ROS     Musculoskeletal  (+) Arthritis , Osteoarthritis,    Abdominal Normal abdominal exam  (+)   Peds  Hematology negative hematology ROS (+)   Anesthesia Other Findings   Reproductive/Obstetrics                           Anesthesia Physical Anesthesia Plan  ASA: 2  Anesthesia Plan: General   Post-op Pain Management:    Induction: Intravenous  PONV Risk Score and Plan: 2 and Ondansetron, Dexamethasone, Midazolam and Treatment may vary due to age or medical condition  Airway Management Planned: Mask and LMA  Additional Equipment: None  Intra-op Plan:   Post-operative Plan: Extubation in OR  Informed Consent: I have reviewed the patients History and Physical, chart, labs and discussed the procedure including the risks, benefits and alternatives for the proposed anesthesia with the patient or authorized representative who has indicated his/her understanding and acceptance.     Dental advisory given  Plan Discussed with: CRNA  Anesthesia Plan Comments: (PAT note by Karoline Caldwell, PA-C: Patient is undergone thorough cardiology evaluation for chest pain as well as palpitations, PSVT, NSVT last seen by Dr. In 01/24/2021.  Per note,  "Symptoms have been quiescent with low-dose metoprolol. Prior cath showed absence of CAD. Echo was without significant structural abnormality. Continue current dose of metoprolol. No further work-up recommended at this time."  16-month follow-up recommended.  Patient will need day of surgery labs and evaluation.  EKG 03/11/2021: Normal sinus rhythm.  Rate 70. Nonspecific ST abnormality  TTE 07/19/2020: 1. Left ventricular ejection fraction, by estimation, is 50 to 55%. The  left ventricle has low normal function. The left ventricle has no regional  wall motion abnormalities. There is mild left ventricular hypertrophy.  Left ventricular diastolic  parameters are consistent with Grade I diastolic dysfunction (impaired  relaxation). The average left ventricular global longitudinal strain is  -13.2 %. The global longitudinal strain is abnormal.  2. Right ventricular systolic function is low normal. The right  ventricular size is normal.  3. The mitral valve is normal in structure. No evidence of mitral valve  regurgitation.  4. The aortic valve is tricuspid. Aortic valve regurgitation is not  visualized.   Event monitor 04/25/2020:  The patient was monitored for 14 days.  The predominant rhythm was sinus with an average rate of 85 bpm (range 47 - 122 bpm in sinus).  There were rare PAC's and occasional PVC's.  There were 6 atrial runs lasting up to 10.8 seconds with a maximum rate of 154 bpm.  There were 9 episodes of wide complex tachycardia lasting up to 8 beats with a maximum rate of 231 bpm. Episodes most likely represent NSVT, though some tracings could  also reflect SVT with aberrancy.  No sustained arrhythmia or prolonged pause was identified.  Patient triggered events correspond to sinus rhythm with PVC's.  Sinus rhythm with rare PAC's and occasional PVC's. Brief runs of PSVT and NSVT noted, as detailed above.  Cath 03/20/2020: Conclusions: 1. No angiographically  significant coronary artery disease. 2. Normal left ventricular contraction with mildly elevated filling pressures consistent with diastolic dysfunction.  Recommendations: 1. Primary prevention of coronary artery disease. 2. Continue blood pressure control as well as recently added metoprolol for management of incidentally noted PVCs. If symptoms persist, ambulatory cardiac monitoring +/- echcardiography will need to be considered. )      Anesthesia Quick Evaluation

## 2021-04-18 ENCOUNTER — Ambulatory Visit (HOSPITAL_COMMUNITY): Payer: BC Managed Care – PPO | Admitting: Physician Assistant

## 2021-04-18 ENCOUNTER — Ambulatory Visit (HOSPITAL_COMMUNITY)
Admission: RE | Admit: 2021-04-18 | Discharge: 2021-04-18 | Disposition: A | Discharge status: Home / Self Care | Payer: BC Managed Care – PPO | Source: Ambulatory Visit | Attending: Orthopaedic Surgery | Admitting: Orthopaedic Surgery

## 2021-04-18 ENCOUNTER — Encounter (HOSPITAL_COMMUNITY): Admission: AD | Disposition: A | Discharge status: Home / Self Care | Payer: Self-pay | Source: Ambulatory Visit

## 2021-04-18 ENCOUNTER — Other Ambulatory Visit: Payer: Self-pay

## 2021-04-18 ENCOUNTER — Encounter (HOSPITAL_COMMUNITY): Payer: Self-pay

## 2021-04-18 ENCOUNTER — Ambulatory Visit (HOSPITAL_COMMUNITY)
Admission: AD | Admit: 2021-04-18 | Discharge: 2021-04-18 | Disposition: A | Discharge status: Home / Self Care | Payer: BC Managed Care – PPO | Source: Ambulatory Visit | Attending: Orthopaedic Surgery | Admitting: Orthopaedic Surgery

## 2021-04-18 DIAGNOSIS — M75122 Complete rotator cuff tear or rupture of left shoulder, not specified as traumatic: Secondary | ICD-10-CM | POA: Diagnosis not present

## 2021-04-18 DIAGNOSIS — M25612 Stiffness of left shoulder, not elsewhere classified: Secondary | ICD-10-CM | POA: Diagnosis not present

## 2021-04-18 DIAGNOSIS — M75102 Unspecified rotator cuff tear or rupture of left shoulder, not specified as traumatic: Secondary | ICD-10-CM

## 2021-04-18 DIAGNOSIS — M19012 Primary osteoarthritis, left shoulder: Secondary | ICD-10-CM | POA: Diagnosis not present

## 2021-04-18 DIAGNOSIS — M25512 Pain in left shoulder: Secondary | ICD-10-CM | POA: Diagnosis not present

## 2021-04-18 HISTORY — PX: RADIOLOGY WITH ANESTHESIA: SHX6223

## 2021-04-18 LAB — BASIC METABOLIC PANEL
Anion gap: 7 (ref 5–15)
BUN: 19 mg/dL (ref 8–23)
CO2: 29 mmol/L (ref 22–32)
Calcium: 9 mg/dL (ref 8.9–10.3)
Chloride: 98 mmol/L (ref 98–111)
Creatinine, Ser: 0.95 mg/dL (ref 0.61–1.24)
GFR, Estimated: >60 mL/min (ref >60)
Glucose, Bld: 99 mg/dL (ref 70–99)
Potassium: 4.3 mmol/L (ref 3.5–5.1)
Sodium: 134 mmol/L — ABNORMAL LOW (ref 135–145)

## 2021-04-18 LAB — CBC
HCT: 41.2 % (ref 39.0–52.0)
Hemoglobin: 14 g/dL (ref 13.0–17.0)
MCH: 31.8 pg (ref 26.0–34.0)
MCHC: 34 g/dL (ref 30.0–36.0)
MCV: 93.6 fL (ref 80.0–100.0)
Platelets: 259 10*3/uL (ref 150–400)
RBC: 4.4 MIL/uL (ref 4.22–5.81)
RDW: 12.7 % (ref 11.5–15.5)
WBC: 8.7 10*3/uL (ref 4.0–10.5)
nRBC: 0 % (ref 0.0–0.2)

## 2021-04-18 SURGERY — MRI WITH ANESTHESIA
Anesthesia: General | Laterality: Left

## 2021-04-18 MED ORDER — PROMETHAZINE HCL 25 MG/ML IJ SOLN: 6.2500 mg | INTRAMUSCULAR | Status: DC | PRN

## 2021-04-18 MED ORDER — LIDOCAINE 2% (20 MG/ML) 5 ML SYRINGE
INTRAMUSCULAR | Status: DC | PRN
  Administered 2021-04-18: 40 mg via INTRAVENOUS

## 2021-04-18 MED ORDER — PROPOFOL 10 MG/ML IV BOLUS
INTRAVENOUS | Status: DC | PRN
Start: 2021-04-18 — End: 2021-04-18
  Administered 2021-04-18: 200 mg via INTRAVENOUS

## 2021-04-18 MED ORDER — LACTATED RINGERS IV SOLN: INTRAVENOUS | Status: DC

## 2021-04-18 MED ORDER — EPHEDRINE SULFATE-NACL 50-0.9 MG/10ML-% IV SOSY
PREFILLED_SYRINGE | INTRAVENOUS | Status: DC | PRN
  Administered 2021-04-18 (×2): 12.5 mg via INTRAVENOUS

## 2021-04-18 MED ORDER — PHENYLEPHRINE 40 MCG/ML (10ML) SYRINGE FOR IV PUSH (FOR BLOOD PRESSURE SUPPORT)
PREFILLED_SYRINGE | INTRAVENOUS | Status: DC | PRN
  Administered 2021-04-18 (×3): 160 ug via INTRAVENOUS

## 2021-04-18 MED ORDER — ONDANSETRON HCL 4 MG/2ML IJ SOLN
INTRAMUSCULAR | Status: DC | PRN
  Administered 2021-04-18: 4 mg via INTRAVENOUS

## 2021-04-18 MED ORDER — DEXAMETHASONE SODIUM PHOSPHATE 10 MG/ML IJ SOLN
INTRAMUSCULAR | Status: DC | PRN
  Administered 2021-04-18: 10 mg via INTRAVENOUS

## 2021-04-18 MED ORDER — CHLORHEXIDINE GLUCONATE 0.12 % MT SOLN
15.0000 mL | Freq: Once | OROMUCOSAL | Status: AC
  Administered 2021-04-18: 15 mL via OROMUCOSAL
  Filled 2021-04-18: qty 15

## 2021-04-18 MED ORDER — FENTANYL CITRATE (PF) 100 MCG/2ML IJ SOLN: 25.0000 ug | INTRAMUSCULAR | Status: DC | PRN

## 2021-04-18 MED ORDER — ACETAMINOPHEN 10 MG/ML IV SOLN: 1000.0000 mg | Freq: Once | INTRAVENOUS | Status: DC | PRN

## 2021-04-18 MED ORDER — MIDAZOLAM HCL 2 MG/2ML IJ SOLN
INTRAMUSCULAR | Status: DC | PRN
  Administered 2021-04-18: 2 mg via INTRAVENOUS

## 2021-04-18 MED ORDER — ORAL CARE MOUTH RINSE: 15.0000 mL | Freq: Once | OROMUCOSAL | Status: AC

## 2021-04-18 NOTE — Transfer of Care (Signed)
Immediate Anesthesia Transfer of Care Note  Patient: Jason [name]  Procedure(s) Performed: MRI SHOULDER WITHOUT CONTRAST WITH ANESTHESIA (Left)  Patient Location: PACU  Anesthesia Type:General  Level of Consciousness: awake, alert  and oriented  Airway & Oxygen Therapy: Patient Spontanous Breathing  Post-op Assessment: Report given to RN, Post -op Vital signs reviewed and stable and Patient moving all extremities  Post vital signs: Reviewed and stable  Last Vitals:  Vitals Value Taken Time  BP 128/82 04/18/21 0911  Temp    Pulse 72 04/18/21 0911  Resp 16 04/18/21 0911  SpO2 100 % 04/18/21 0911  Vitals shown include unvalidated device data.  Last Pain:  Vitals:   04/18/21 0648  TempSrc:   PainSc: 4          Complications: No notable events documented.

## 2021-04-18 NOTE — Anesthesia Postprocedure Evaluation (Signed)
Anesthesia Post Note  Patient: Jason [name]  Procedure(s) Performed: MRI SHOULDER WITHOUT CONTRAST WITH ANESTHESIA (Left)     Patient location during evaluation: PACU Anesthesia Type: General Level of consciousness: awake and alert Pain management: pain level controlled Vital Signs Assessment: post-procedure vital signs reviewed and stable Respiratory status: spontaneous breathing, nonlabored ventilation, respiratory function stable and patient connected to nasal cannula oxygen Cardiovascular status: blood pressure returned to baseline and stable Postop Assessment: no apparent nausea or vomiting Anesthetic complications: no   No notable events documented.  Last Vitals:  Vitals:   04/18/21 0925 04/18/21 0940  BP: 130/78 135/85  Pulse: 66 65  Resp: 12 11  Temp:  36.7 C  SpO2: 97% 97%    Last Pain:  Vitals:   04/18/21 0910  TempSrc:   PainSc: 0-No pain                 Belenda Cruise P Tarris Delbene

## 2021-04-18 NOTE — Anesthesia Procedure Notes (Signed)
Procedure Name: LMA Insertion Date/Time: 04/18/2021 8:24 AM Performed by: Leonor Liv, CRNA Pre-anesthesia Checklist: Patient identified, Emergency Drugs available, Suction available and Patient being monitored Patient Re-evaluated:Patient Re-evaluated prior to induction Oxygen Delivery Method: Circle System Utilized Preoxygenation: Pre-oxygenation with 100% oxygen Induction Type: IV induction Ventilation: Mask ventilation without difficulty LMA: LMA inserted LMA Size: 5.0 Number of attempts: 1 Airway Equipment and Method: Bite block Placement Confirmation: positive ETCO2 Tube secured with: Tape Dental Injury: Teeth and Oropharynx as per pre-operative assessment

## 2021-04-19 ENCOUNTER — Encounter (HOSPITAL_COMMUNITY): Payer: Self-pay | Admitting: Radiology

## 2021-04-24 DIAGNOSIS — M25512 Pain in left shoulder: Secondary | ICD-10-CM | POA: Diagnosis not present

## 2021-04-24 DIAGNOSIS — M25612 Stiffness of left shoulder, not elsewhere classified: Secondary | ICD-10-CM | POA: Diagnosis not present

## 2021-04-25 ENCOUNTER — Other Ambulatory Visit: Payer: Self-pay | Admitting: Orthopaedic Surgery

## 2021-04-25 DIAGNOSIS — M25512 Pain in left shoulder: Secondary | ICD-10-CM

## 2021-04-25 DIAGNOSIS — G8929 Other chronic pain: Secondary | ICD-10-CM

## 2021-04-25 DIAGNOSIS — M25612 Stiffness of left shoulder, not elsewhere classified: Secondary | ICD-10-CM | POA: Diagnosis not present

## 2021-05-01 ENCOUNTER — Ambulatory Visit: Payer: BC Managed Care – PPO

## 2021-05-04 ENCOUNTER — Other Ambulatory Visit: Payer: Self-pay | Admitting: Cardiovascular Disease

## 2021-05-06 ENCOUNTER — Other Ambulatory Visit: Payer: Self-pay | Admitting: Internal Medicine

## 2021-05-06 ENCOUNTER — Encounter: Payer: Self-pay | Admitting: Internal Medicine

## 2021-05-06 MED ORDER — OXYCODONE-ACETAMINOPHEN 5-325 MG PO TABS: 1.0000 | ORAL_TABLET | ORAL | 0 refills | Status: DC | PRN

## 2021-05-06 NOTE — Telephone Encounter (Signed)
Last written 04-02-21 #30 ?[facility] [place] ?

## 2021-05-08 DIAGNOSIS — Z01818 Encounter for other preprocedural examination: Secondary | ICD-10-CM | POA: Diagnosis not present

## 2021-05-17 DIAGNOSIS — M75102 Unspecified rotator cuff tear or rupture of left shoulder, not specified as traumatic: Secondary | ICD-10-CM | POA: Diagnosis not present

## 2021-05-17 DIAGNOSIS — N400 Enlarged prostate without lower urinary tract symptoms: Secondary | ICD-10-CM | POA: Diagnosis not present

## 2021-05-17 DIAGNOSIS — E039 Hypothyroidism, unspecified: Secondary | ICD-10-CM | POA: Diagnosis not present

## 2021-05-17 DIAGNOSIS — M7522 Bicipital tendinitis, left shoulder: Secondary | ICD-10-CM | POA: Diagnosis not present

## 2021-05-17 DIAGNOSIS — M19012 Primary osteoarthritis, left shoulder: Secondary | ICD-10-CM | POA: Diagnosis not present

## 2021-05-17 DIAGNOSIS — Z791 Long term (current) use of non-steroidal anti-inflammatories (NSAID): Secondary | ICD-10-CM | POA: Diagnosis not present

## 2021-05-17 DIAGNOSIS — Z79890 Hormone replacement therapy (postmenopausal): Secondary | ICD-10-CM | POA: Diagnosis not present

## 2021-05-17 DIAGNOSIS — M24512 Contracture, left shoulder: Secondary | ICD-10-CM | POA: Diagnosis not present

## 2021-05-17 DIAGNOSIS — M75112 Incomplete rotator cuff tear or rupture of left shoulder, not specified as traumatic: Secondary | ICD-10-CM | POA: Diagnosis not present

## 2021-05-17 DIAGNOSIS — M75122 Complete rotator cuff tear or rupture of left shoulder, not specified as traumatic: Secondary | ICD-10-CM | POA: Diagnosis not present

## 2021-05-17 DIAGNOSIS — G8918 Other acute postprocedural pain: Secondary | ICD-10-CM | POA: Diagnosis not present

## 2021-05-17 DIAGNOSIS — M25512 Pain in left shoulder: Secondary | ICD-10-CM | POA: Diagnosis not present

## 2021-05-17 DIAGNOSIS — I10 Essential (primary) hypertension: Secondary | ICD-10-CM | POA: Diagnosis not present

## 2021-05-17 DIAGNOSIS — M66812 Spontaneous rupture of other tendons, left shoulder: Secondary | ICD-10-CM | POA: Diagnosis not present

## 2021-05-18 ENCOUNTER — Other Ambulatory Visit: Payer: Self-pay | Admitting: Internal Medicine

## 2021-05-18 DIAGNOSIS — M19012 Primary osteoarthritis, left shoulder: Secondary | ICD-10-CM | POA: Diagnosis not present

## 2021-05-18 DIAGNOSIS — M24512 Contracture, left shoulder: Secondary | ICD-10-CM | POA: Diagnosis not present

## 2021-05-18 DIAGNOSIS — M7522 Bicipital tendinitis, left shoulder: Secondary | ICD-10-CM | POA: Diagnosis not present

## 2021-05-18 DIAGNOSIS — Z79890 Hormone replacement therapy (postmenopausal): Secondary | ICD-10-CM | POA: Diagnosis not present

## 2021-05-18 DIAGNOSIS — Z791 Long term (current) use of non-steroidal anti-inflammatories (NSAID): Secondary | ICD-10-CM | POA: Diagnosis not present

## 2021-05-18 DIAGNOSIS — M75102 Unspecified rotator cuff tear or rupture of left shoulder, not specified as traumatic: Secondary | ICD-10-CM | POA: Diagnosis not present

## 2021-05-21 DIAGNOSIS — M25612 Stiffness of left shoulder, not elsewhere classified: Secondary | ICD-10-CM | POA: Diagnosis not present

## 2021-05-21 DIAGNOSIS — M25512 Pain in left shoulder: Secondary | ICD-10-CM | POA: Diagnosis not present

## 2021-05-29 DIAGNOSIS — M25512 Pain in left shoulder: Secondary | ICD-10-CM | POA: Diagnosis not present

## 2021-05-29 DIAGNOSIS — M25612 Stiffness of left shoulder, not elsewhere classified: Secondary | ICD-10-CM | POA: Diagnosis not present

## 2021-05-30 DIAGNOSIS — M25612 Stiffness of left shoulder, not elsewhere classified: Secondary | ICD-10-CM | POA: Diagnosis not present

## 2021-05-30 DIAGNOSIS — M25512 Pain in left shoulder: Secondary | ICD-10-CM | POA: Diagnosis not present

## 2021-06-02 ENCOUNTER — Encounter: Payer: Self-pay | Admitting: Emergency Medicine

## 2021-06-02 ENCOUNTER — Emergency Department
Admission: EM | Admit: 2021-06-02 | Discharge: 2021-06-03 | Disposition: A | Discharge status: Home / Self Care | Payer: BC Managed Care – PPO | Attending: Emergency Medicine | Admitting: Emergency Medicine

## 2021-06-02 DIAGNOSIS — I10 Essential (primary) hypertension: Secondary | ICD-10-CM | POA: Diagnosis not present

## 2021-06-02 DIAGNOSIS — K5900 Constipation, unspecified: Secondary | ICD-10-CM | POA: Diagnosis not present

## 2021-06-02 DIAGNOSIS — E039 Hypothyroidism, unspecified: Secondary | ICD-10-CM | POA: Insufficient documentation

## 2021-06-02 DIAGNOSIS — K5903 Drug induced constipation: Secondary | ICD-10-CM | POA: Diagnosis not present

## 2021-06-02 DIAGNOSIS — K5641 Fecal impaction: Secondary | ICD-10-CM

## 2021-06-02 LAB — URINALYSIS, ROUTINE W REFLEX MICROSCOPIC
Bilirubin Urine: NEGATIVE
Glucose, UA: NEGATIVE mg/dL
Hgb urine dipstick: NEGATIVE
Ketones, ur: NEGATIVE mg/dL
Leukocytes,Ua: NEGATIVE
Nitrite: NEGATIVE
Protein, ur: NEGATIVE mg/dL
Specific Gravity, Urine: 1.015 (ref 1.005–1.030)
pH: 7 (ref 5.0–8.0)

## 2021-06-02 LAB — COMPREHENSIVE METABOLIC PANEL
ALT: 23 U/L (ref 0–44)
AST: 37 U/L (ref 15–41)
Albumin: 3.7 g/dL (ref 3.5–5.0)
Alkaline Phosphatase: 178 U/L — ABNORMAL HIGH (ref 38–126)
Anion gap: 9 (ref 5–15)
BUN: 23 mg/dL (ref 8–23)
CO2: 28 mmol/L (ref 22–32)
Calcium: 9.2 mg/dL (ref 8.9–10.3)
Chloride: 91 mmol/L — ABNORMAL LOW (ref 98–111)
Creatinine, Ser: 0.72 mg/dL (ref 0.61–1.24)
GFR, Estimated: >60 mL/min (ref >60)
Glucose, Bld: 126 mg/dL — ABNORMAL HIGH (ref 70–99)
Potassium: 3.7 mmol/L (ref 3.5–5.1)
Sodium: 128 mmol/L — ABNORMAL LOW (ref 135–145)
Total Bilirubin: 1.3 mg/dL — ABNORMAL HIGH (ref 0.3–1.2)
Total Protein: 7.7 g/dL (ref 6.5–8.1)

## 2021-06-02 LAB — LIPASE, BLOOD: Lipase: 42 U/L (ref 11–51)

## 2021-06-02 LAB — CBC
HCT: 36.8 % — ABNORMAL LOW (ref 39.0–52.0)
Hemoglobin: 12.3 g/dL — ABNORMAL LOW (ref 13.0–17.0)
MCH: 30.8 pg (ref 26.0–34.0)
MCHC: 33.4 g/dL (ref 30.0–36.0)
MCV: 92.2 fL (ref 80.0–100.0)
Platelets: 445 10*3/uL — ABNORMAL HIGH (ref 150–400)
RBC: 3.99 MIL/uL — ABNORMAL LOW (ref 4.22–5.81)
RDW: 12.7 % (ref 11.5–15.5)
WBC: 24 10*3/uL — ABNORMAL HIGH (ref 4.0–10.5)
nRBC: 0 % (ref 0.0–0.2)

## 2021-06-02 LAB — LACTIC ACID, PLASMA: Lactic Acid, Venous: 1.7 mmol/L (ref 0.5–1.9)

## 2021-06-02 NOTE — ED Provider Notes (Signed)
? ?[facility] ?Provider Note ? ? ? Event Date/Time  ? First MD Initiated Contact with Patient 06/02/21 2259   ?  (approximate) ? ? ?History  ? ?Chief Complaint ?Constipation ? ? ?HPI ? ?Jason [name] is a 66 y.o. male with past medical history of hypertension, hyperlipidemia, SVT, and hypothyroidism who presents to the ED complaining of constipation.  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  He states that he has not been able to pass stool in the past 24 hours and feels like something is stuck in his rectal area.  He reports leaking a small amount of liquid stool when he attempts to have a bowel movement.  He denies any abdominal pain, nausea, vomiting, fevers, or dysuria.  He did pick up a stool softener from the pharmacy earlier today and has taken 1 dose of this, has not tried a laxative. ?  ? ? ?Physical Exam  ? ?Triage Vital Signs: ?ED Triage Vitals [06/02/21 2118]  ?Enc Vitals Group  ?   BP 134/88  ?   Pulse Rate (!) 106  ?   Resp 20  ?   Temp 98.3 ?F (36.8 ?C)  ?   Temp Source Oral  ?   SpO2 100 %  ?   Weight   ?   Height   ?   Head Circumference   ?   Peak Flow   ?   Pain Score   ?   Pain Loc   ?   Pain Edu?   ?   Excl. in [place]?   ? ? ?Most recent vital signs: ?Vitals:  ? 06/02/21 2118 06/03/21 0031  ?BP: 134/88 129/85  ?Pulse: (!) 106 98  ?Resp: 20 20  ?Temp: 98.3 ?F (36.8 ?C)   ?SpO2: 100% 100%  ? ? ?Constitutional: Alert and oriented. ?Eyes: Conjunctivae are normal. ?Head: Atraumatic. ?Nose: No congestion/rhinnorhea. ?Mouth/Throat: Mucous membranes are moist.  ?Cardiovascular: Normal rate, regular rhythm. Grossly normal heart sounds.  2+ radial pulses bilaterally. ?Respiratory: Normal respiratory effort.  No retractions. Lungs CTAB. ?Gastrointestinal: Soft and nontender. No distention.  Rectal exam with stool ball  noted. ?Musculoskeletal: No lower extremity tenderness nor edema.  ?Neurologic:  Normal speech and language. No gross focal neurologic deficits are appreciated. ? ? ? ?ED Results / Procedures / Treatments  ? ?Labs ?(all labs ordered are listed, but only abnormal results are displayed) ?Labs Reviewed  ?COMPREHENSIVE METABOLIC PANEL - Abnormal; Notable for the following components:  ?    Result Value  ? Sodium 128 (*)   ? Chloride 91 (*)   ? Glucose, Bld 126 (*)   ? Alkaline Phosphatase 178 (*)   ? Total Bilirubin 1.3 (*)   ? All other components within normal limits  ?CBC - Abnormal; Notable for the following components:  ? WBC 24.0 (*)   ? RBC 3.99 (*)   ? Hemoglobin 12.3 (*)   ? HCT 36.8 (*)   ? Platelets 445 (*)   ? All other components within normal limits  ?URINALYSIS, ROUTINE W REFLEX MICROSCOPIC - Abnormal; Notable for the following components:  ? Color, Urine YELLOW (*)   ? APPearance CLEAR (*)   ? All other components within normal limits  ?LIPASE, BLOOD  ?LACTIC ACID, PLASMA  ? ? ?PROCEDURES: ? ?Critical Care performed: No ? ?  Procedures ? ?------------------------------------------------------------------------------------------------------------------- ?Fecal Disimpaction Procedure Note: ? ?Performed by me: ? ?Patient placed in the lateral recumbent position with knees drawn towards chest. Nurse present for patient support. Large amount of hard brown stool removed. No complications during procedure. ? ? ?------------------------------------------------------------------------------------------------------------------ ? ?MEDICATIONS ORDERED IN ED: ?Medications - No data to display ? ? ?IMPRESSION / MDM / ASSESSMENT AND PLAN / ED COURSE  ?I reviewed the triage vital signs and the nursing notes. ?             ?               ? ?66 y.o. male with past medical history of hypertension, hyperlipidemia, SVT, and hypothyroidism who presents to the ED complaining of constipation after starting on pain medication for  left shoulder replacement and not completing a bowel regimen. ? ?Differential diagnosis includes, but is not limited to, constipation, bowel obstruction, fecal impaction, electrolyte abnormality. ? ?Patient is nontoxic-appearing and in no acute distress, vital signs are unremarkable and he has a benign abdominal exam.  He does have a rectal stool ball with significant pressure and discomfort in his rectal area.  Manual disimpaction was performed with improvement in patient's symptoms.  No findings to suggest bowel obstruction at this time and we will hold off on CT imaging.  Labs are remarkable for leukocytosis but doubt acute infectious process as left shoulder surgical site appears to be healing well.  He also has mild hyponatremia, was counseled to follow-up with his PCP for this.  Patient was counseled on over-the-counter bowel regimen and counseled to limit pain medication is much as possible.  He was counseled to return to the ED for new worsening symptoms, patient and spouse agree with plan. ? ?  ? ? ?FINAL CLINICAL IMPRESSION(S) / ED DIAGNOSES  ? ?Final diagnoses:  ?Drug-induced constipation  ?Fecal impaction ([place])  ? ? ? ?Rx / DC Orders  ? ?ED Discharge Orders   ? ? None  ? ?  ? ? ? ?Note:  This document was prepared using Dragon voice recognition software and may include unintentional dictation errors. ?  ?Blake Divine, MD ?06/03/21 [phone] ? ?

## 2021-06-02 NOTE — ED Triage Notes (Signed)
Pt had left shoulder surgery on 3/24 and has not taken stool softeners while taking narcotics for surgical pain. Pt to ED due to no BM since 4/7. Pt has taken OTC medication and enemas without relief.  ?

## 2021-06-02 NOTE — ED Triage Notes (Addendum)
FIRST NURSE NOTE:  Pt reports being "blocked up" since this morning. Pt on arrival states he has to use the bathroom and feels like he is going to have a bowel movement. ?

## 2021-06-03 NOTE — ED Notes (Signed)
Patient disempacted by provided. Patient screamed throughout entire procedure. ?

## 2021-06-03 NOTE — ED Notes (Signed)
Patient is still on the bedside commode at this time. He is attempting to have a bowel movement. He and his wife have received his discharge instructions.  ?

## 2021-06-03 NOTE — Discharge Instructions (Signed)

## 2021-06-03 NOTE — ED Notes (Signed)
Discharge instructions provided to patient with script info and follow-up. Patient verbalized understanding. Patient ambulatory with a steady gait. Out to the waiting room. ?

## 2021-06-03 NOTE — ED Notes (Signed)
Patient has finally vacated the room. Will have housekeeping clean it ?

## 2021-06-04 DIAGNOSIS — M25612 Stiffness of left shoulder, not elsewhere classified: Secondary | ICD-10-CM | POA: Diagnosis not present

## 2021-06-04 DIAGNOSIS — M25512 Pain in left shoulder: Secondary | ICD-10-CM | POA: Diagnosis not present

## 2021-06-07 DIAGNOSIS — M25512 Pain in left shoulder: Secondary | ICD-10-CM | POA: Diagnosis not present

## 2021-06-07 DIAGNOSIS — M25612 Stiffness of left shoulder, not elsewhere classified: Secondary | ICD-10-CM | POA: Diagnosis not present

## 2021-06-12 DIAGNOSIS — M25612 Stiffness of left shoulder, not elsewhere classified: Secondary | ICD-10-CM | POA: Diagnosis not present

## 2021-06-12 DIAGNOSIS — M25512 Pain in left shoulder: Secondary | ICD-10-CM | POA: Diagnosis not present

## 2021-06-13 ENCOUNTER — Other Ambulatory Visit: Payer: Self-pay | Admitting: Internal Medicine

## 2021-06-17 DIAGNOSIS — M25512 Pain in left shoulder: Secondary | ICD-10-CM | POA: Diagnosis not present

## 2021-06-17 DIAGNOSIS — M25612 Stiffness of left shoulder, not elsewhere classified: Secondary | ICD-10-CM | POA: Diagnosis not present

## 2021-06-21 DIAGNOSIS — Z4889 Encounter for other specified surgical aftercare: Secondary | ICD-10-CM | POA: Diagnosis not present

## 2021-06-21 DIAGNOSIS — M25512 Pain in left shoulder: Secondary | ICD-10-CM | POA: Diagnosis not present

## 2021-06-21 DIAGNOSIS — M25612 Stiffness of left shoulder, not elsewhere classified: Secondary | ICD-10-CM | POA: Diagnosis not present

## 2021-06-24 DIAGNOSIS — M25612 Stiffness of left shoulder, not elsewhere classified: Secondary | ICD-10-CM | POA: Diagnosis not present

## 2021-06-24 DIAGNOSIS — M25512 Pain in left shoulder: Secondary | ICD-10-CM | POA: Diagnosis not present

## 2021-06-27 DIAGNOSIS — M25512 Pain in left shoulder: Secondary | ICD-10-CM | POA: Diagnosis not present

## 2021-06-27 DIAGNOSIS — M25612 Stiffness of left shoulder, not elsewhere classified: Secondary | ICD-10-CM | POA: Diagnosis not present

## 2021-07-01 DIAGNOSIS — M25512 Pain in left shoulder: Secondary | ICD-10-CM | POA: Diagnosis not present

## 2021-07-01 DIAGNOSIS — M25612 Stiffness of left shoulder, not elsewhere classified: Secondary | ICD-10-CM | POA: Diagnosis not present

## 2021-07-04 DIAGNOSIS — M25512 Pain in left shoulder: Secondary | ICD-10-CM | POA: Diagnosis not present

## 2021-07-04 DIAGNOSIS — M25612 Stiffness of left shoulder, not elsewhere classified: Secondary | ICD-10-CM | POA: Diagnosis not present

## 2021-07-08 DIAGNOSIS — M25612 Stiffness of left shoulder, not elsewhere classified: Secondary | ICD-10-CM | POA: Diagnosis not present

## 2021-07-08 DIAGNOSIS — M25512 Pain in left shoulder: Secondary | ICD-10-CM | POA: Diagnosis not present

## 2021-07-10 ENCOUNTER — Encounter: Payer: BC Managed Care – PPO | Attending: Internal Medicine | Admitting: Internal Medicine

## 2021-07-10 DIAGNOSIS — L89003 Pressure ulcer of unspecified elbow, stage 3: Secondary | ICD-10-CM | POA: Insufficient documentation

## 2021-07-10 DIAGNOSIS — I10 Essential (primary) hypertension: Secondary | ICD-10-CM | POA: Diagnosis not present

## 2021-07-10 DIAGNOSIS — M75102 Unspecified rotator cuff tear or rupture of left shoulder, not specified as traumatic: Secondary | ICD-10-CM | POA: Insufficient documentation

## 2021-07-12 DIAGNOSIS — M25612 Stiffness of left shoulder, not elsewhere classified: Secondary | ICD-10-CM | POA: Diagnosis not present

## 2021-07-12 DIAGNOSIS — M25512 Pain in left shoulder: Secondary | ICD-10-CM | POA: Diagnosis not present

## 2021-07-12 NOTE — Progress Notes (Signed)
YING, ROCKS ([id number]) Visit Report for 07/10/2021 Abuse Risk Screen Details Patient Name: Jason [name], Jason [name]. Date of Service: 07/10/2021 8:45 AM Medical Record Number: [id number] Patient Account Number: [id number] Date of Birth/Sex: [birth year] (66 y.o. Male) Treating RN: Carlene Coria Primary Care Rasool Rommel: Viviana Simpler Other Clinician: Referring Carter Kaman: Renee Harder Treating Kalib Bhagat/Extender: Yaakov Guthrie in Treatment: 0 Abuse Risk Screen Items Answer ABUSE RISK SCREEN: Has anyone close to you tried to hurt or harm you recentlyo No Do you feel uncomfortable with anyone in your familyo No Has anyone forced you do things that you didnot want to doo No Electronic Signature(s) Signed: 07/11/2021 4:24:09 PM By: Carlene Coria RN Entered By: Carlene Coria on 07/10/2021 08:59:26 Jason [name] ([id number]) -------------------------------------------------------------------------------- Activities of Daily Living Details Patient Name: Jason [name] Date of Service: 07/10/2021 8:45 AM Medical Record Number: [id number] Patient Account Number: [id number] Date of Birth/Sex: June 14, 1955 (66 y.o. Male) Treating RN: Carlene Coria Primary Care Harika Laidlaw: Viviana Simpler Other Clinician: Referring Keely Drennan: Renee Harder Treating Nimco Bivens/Extender: Yaakov Guthrie in Treatment: 0 Activities of Daily Living Items Answer Activities of Daily Living (Please select one for each item) Drive Automobile Not Able Take Medications Completely Able Use Telephone Completely Able Care for Appearance Completely Able Use Toilet Completely Able Bath / Shower Completely Able Dress Self Completely Able Feed Self Completely Able Walk Completely Able Get In / Out Bed Completely Able Housework Completely Able Prepare Meals Completely [place] for Self Completely Able Electronic Signature(s) Signed: 07/11/2021 4:24:09 PM By: Carlene Coria  RN Entered By: Carlene Coria on 07/10/2021 09:00:14 Jason [name] ([id number]) -------------------------------------------------------------------------------- Education Screening Details Patient Name: Jason [name] Date of Service: 07/10/2021 8:45 AM Medical Record Number: [id number] Patient Account Number: [id number] Date of Birth/Sex: 1955-12-08 (66 y.o. Male) Treating RN: Carlene Coria Primary Care Burnham Trost: Viviana Simpler Other Clinician: Referring Karrissa Parchment: Renee Harder Treating Shiasia Porro/Extender: Yaakov Guthrie in Treatment: 0 Primary Learner Assessed: Patient Learning Preferences/Education Level/Primary Language Learning Preference: Explanation Highest Education Level: College or Above Preferred Language: English Cognitive Barrier Language Barrier: No Translator Needed: No Memory Deficit: No Emotional Barrier: No Cultural/Religious Beliefs Affecting Medical Care: No Physical Barrier Impaired Vision: Yes Glasses Knowledge/Comprehension Knowledge Level: High Comprehension Level: High Ability to understand written instructions: High Ability to understand verbal instructions: High Motivation Anxiety Level: Anxious Cooperation: Cooperative Education Importance: Acknowledges Need Interest in Health Problems: Asks Questions Perception: Coherent Willingness to Engage in Self-Management High Activities: Readiness to Engage in Self-Management High Activities: Electronic Signature(s) Signed: 07/11/2021 4:24:09 PM By: Carlene Coria RN Entered By: Carlene Coria on 07/10/2021 09:00:38 Jason [name] ([id number]) -------------------------------------------------------------------------------- Fall Risk Assessment Details Patient Name: Jason [name] Date of Service: 07/10/2021 8:45 AM Medical Record Number: [id number] Patient Account Number: [id number] Date of Birth/Sex: 01-Oct-1955 (66 y.o. Male) Treating RN: Carlene Coria Primary Care Dior Stepter:  Viviana Simpler Other Clinician: Referring Lezli Danek: Renee Harder Treating Maziyah Vessel/Extender: Yaakov Guthrie in Treatment: 0 Fall Risk Assessment Items Have you had 2 or more falls in the last 12 monthso 0 No Have you had any fall that resulted in injury in the last 12 monthso 0 No FALLS RISK SCREEN History of falling - immediate or within 3 months 0 No Secondary diagnosis (Do you have 2 or more medical diagnoseso) 0 No Ambulatory aid None/bed rest/wheelchair/nurse 0 No Crutches/cane/walker 0 No Furniture 0 No Intravenous therapy Access/Saline/Heparin Lock 0 No Gait/Transferring Normal/ bed rest/ wheelchair 0 No Weak (short steps with or without shuffle, stooped but able  to lift head while walking, may 0 No seek support from furniture) Impaired (short steps with shuffle, may have difficulty arising from chair, head down, impaired 0 No balance) Mental Status Oriented to own ability 0 No Electronic Signature(s) Signed: 07/11/2021 4:24:09 PM By: Carlene Coria RN Entered By: Carlene Coria on 07/10/2021 09:00:44 Jason [name] ([id number]) -------------------------------------------------------------------------------- Foot Assessment Details Patient Name: Jason [name]. Date of Service: 07/10/2021 8:45 AM Medical Record Number: [id number] Patient Account Number: [id number] Date of Birth/Sex: [birth year] (66 y.o. Male) Treating RN: Carlene Coria Primary Care Gaige Fussner: Viviana Simpler Other Clinician: Referring Exilda Wilhite: Renee Harder Treating Mandel Seiden/Extender: Yaakov Guthrie in Treatment: 0 Foot Assessment Items Site Locations + = Sensation present, - = Sensation absent, C = Callus, U = Ulcer R = Redness, W = Warmth, M = Maceration, PU = Pre-ulcerative lesion F = Fissure, S = Swelling, D = Dryness Assessment Right: Left: Other Deformity: No No Prior Foot Ulcer: No No Prior Amputation: No No Charcot Joint: No No Ambulatory Status: Ambulatory  Without Help Gait: Steady Electronic Signature(s) Signed: 07/11/2021 4:24:09 PM By: Carlene Coria RN Entered By: Carlene Coria on 07/10/2021 [place], [place] ([id number]) -------------------------------------------------------------------------------- Nutrition Risk Screening Details Patient Name: Jason [name]. Date of Service: 07/10/2021 8:45 AM Medical Record Number: [id number] Patient Account Number: [id number] Date of Birth/Sex: 15-Jun-1955 (66 y.o. Male) Treating RN: Carlene Coria Primary Care Cleve Paolillo: Viviana Simpler Other Clinician: Referring Circe Chilton: Renee Harder Treating Jazline Cumbee/Extender: Yaakov Guthrie in Treatment: 0 Height (in): 69 Weight (lbs): 189 Body Mass Index (BMI): 27.9 Nutrition Risk Screening Items Score Screening NUTRITION RISK SCREEN: I have an illness or condition that made me change the kind and/or amount of food I eat 0 No I eat fewer than two meals per day 0 No I eat few fruits and vegetables, or milk products 0 No I have three or more drinks of beer, liquor or wine almost every day 0 No I have tooth or mouth problems that make it hard for me to eat 0 No I don't always have enough money to buy the food I need 0 No I eat alone most of the time 0 No I take three or more different prescribed or over-the-counter drugs a day 1 Yes Without wanting to, I have lost or gained 10 pounds in the last six months 0 No I am not always physically able to shop, cook and/or feed myself 0 No Nutrition Protocols Good Risk Protocol 0 No interventions needed Moderate Risk Protocol High Risk Proctocol Risk Level: Good Risk Score: 1 Electronic Signature(s) Signed: 07/11/2021 4:24:09 PM By: Carlene Coria RN Entered By: Carlene Coria on 07/10/2021 09:00:56

## 2021-07-12 NOTE — Progress Notes (Signed)
Jason, [name] ([id number]) Visit Report for 07/10/2021 Chief Complaint Document Details Patient Name: Jason [name], Jason [name]. Date of Service: 07/10/2021 8:45 AM Medical Record Number: [id number] Patient Account Number: [id number] Date of Birth/Sex: 04-19-1955 (66 y.o. Male) Treating RN: Carlene Coria Primary Care Provider: Viviana Simpler Other Clinician: Referring Provider: Renee Harder Treating Provider/Extender: Yaakov Guthrie in Treatment: 0 Information Obtained from: Patient Chief Complaint 07/10/2021; left elbow ulcer Electronic Signature(s) Signed: 07/10/2021 10:21:42 AM By: Kalman Shan DO Entered By: Kalman Shan on 07/10/2021 09:33:51 Jason [name] ([id number]) -------------------------------------------------------------------------------- Debridement Details Patient Name: Jason [name] Date of Service: 07/10/2021 8:45 AM Medical Record Number: [id number] Patient Account Number: [id number] Date of Birth/Sex: 1955/09/24 (66 y.o. Male) Treating RN: Carlene Coria Primary Care Provider: Viviana Simpler Other Clinician: Referring Provider: Renee Harder Treating Provider/Extender: Yaakov Guthrie in Treatment: 0 Debridement Performed for Wound #1 Left Elbow Assessment: Performed By: Physician Kalman Shan, MD Debridement Type: Chemical/Enzymatic/Mechanical Agent Used: Saline and gauze Level of Consciousness (Pre- Awake and Alert procedure): Pre-procedure Verification/Time Out Yes - 09:24 Taken: Start Time: 09:24 Instrument: Curette Bleeding: Minimum Hemostasis Achieved: Pressure End Time: 09:25 Procedural Pain: 0 Post Procedural Pain: 0 Response to Treatment: Procedure was tolerated well Level of Consciousness (Post- Awake and Alert procedure): Post Debridement Measurements of Total Wound Length: (cm) 0.7 Stage: Category/Stage III Width: (cm) 0.5 Depth: (cm) 0.3 Volume: (cm) 0.082 Character of Wound/Ulcer Post  Debridement: Improved Post Procedure Diagnosis Same as Pre-procedure Electronic Signature(s) Signed: 07/10/2021 10:21:42 AM By: Kalman Shan DO Signed: 07/11/2021 4:24:09 PM By: Carlene Coria RN Entered By: Carlene Coria on 07/10/2021 09:26:07 Jason [name] ([id number]) -------------------------------------------------------------------------------- HPI Details Patient Name: Jason [name]. Date of Service: 07/10/2021 8:45 AM Medical Record Number: [id number] Patient Account Number: [id number] Date of Birth/Sex: [birth year] (66 y.o. Male) Treating RN: Carlene Coria Primary Care Provider: Viviana Simpler Other Clinician: Referring Provider: Renee Harder Treating Provider/Extender: Yaakov Guthrie in Treatment: 0 History of Present Illness HPI Description: Admission 07/10/2021 [name] is a 66 year old male with a past medical history of left rotator cuff status post shoulder surgery 04/18/2021, hypertension that presents to the clinic for a 6-week history of nonhealing ulcer to the left elbow. He has been wearing a sling for the past 2 to 3 months and this created a pressure injury to his left elbow that eventually opened. He has been using Betadine to the area and keeping it covered. He has cut his sling to relieve some of the pressure. He does sleep in a recliner putting pressure on the armrest to his elbow. He currently denies signs of infection. Electronic Signature(s) Signed: 07/10/2021 10:21:42 AM By: Kalman Shan DO Entered By: Kalman Shan on 07/10/2021 09:44:06 Jason [name] ([id number]) -------------------------------------------------------------------------------- Physical Exam Details Patient Name: Jason [name] Date of Service: 07/10/2021 8:45 AM Medical Record Number: [id number] Patient Account Number: [id number] Date of Birth/Sex: 1955-11-04 (66 y.o. Male) Treating RN: Carlene Coria Primary Care Provider: Viviana Simpler Other  Clinician: Referring Provider: Renee Harder Treating Provider/Extender: Yaakov Guthrie in Treatment: 0 Constitutional . Psychiatric . Notes Left elbow: Open wound with granulation tissue and scant nonviable tissue. No surrounding signs of infection. Circumferential undermining. No drainage noted Electronic Signature(s) Signed: 07/10/2021 10:21:42 AM By: Kalman Shan DO Entered By: Kalman Shan on 07/10/2021 09:44:37 Jason [name] ([id number]) -------------------------------------------------------------------------------- Physician Orders Details Patient Name: Jason [name] Date of Service: 07/10/2021 8:45 AM Medical Record Number: [id number] Patient Account Number: [id number] Date of Birth/Sex: [birth year] (66 y.o. 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 Bathing/ Shower/ Hygiene o May shower; gently cleanse wound with antibacterial soap, rinse and pat dry prior to dressing wounds Wound Treatment Wound #1 - Elbow Wound Laterality: Left Cleanser: Byram Ancillary Kit - 15 Day Supply (DME) (Generic) 1 x Per Day/30 Days Discharge Instructions: Use supplies as instructed; Kit contains: (15) Saline Bullets; (15) 3x3 Gauze; 15 pr Gloves Primary Dressing: Prisma 4.34 (in) (DME) (Generic) 1 x Per Day/30 Days Discharge Instructions: Moisten w/normal saline or sterile water; Cover wound as directed. Do not remove from wound bed. Secondary Dressing: (SILCONE BORDER) Zetuvit Plus SILICONE BORDER  Dressing 5x5 (in/in) (DME) (Generic) 1 x Per Day/30 Days Discharge Instructions: Please do not put silicone bordered dressings under wraps. Use non-bordered dressing only. Patient Medications Allergies: No Known Allergies Notifications Medication Indication Start End mupirocin 07/10/2021 DOSE 1 - topical 2 % ointment - apply once daily Electronic Signature(s) Signed: 07/10/2021 9:47:41 AM By: Kalman Shan DO Entered By: Kalman Shan on 07/10/2021 09:47:41 Jason [name] ([id number]) -------------------------------------------------------------------------------- Problem List Details Patient Name: Jason [name]. Date of Service: 07/10/2021 8:45 AM Medical Record Number: [id number] Patient Account Number: [id number] Date of Birth/Sex: Jun 19, 1955 (66 y.o. 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 Date of Service: 07/10/2021 8:45 AM Medical Record Number: [id number] Patient Account Number: [id number] Date of Birth/Sex: 03/25/1955 (66 y.o. Male) Treating RN: Carlene Coria Primary Care Provider: Viviana Simpler Other Clinician: Referring Provider: Renee Harder Treating  Provider/Extender: Yaakov Guthrie in Treatment: 0 Subjective Chief Complaint Information obtained from Patient 07/10/2021; left elbow ulcer History of Present Illness (HPI) Admission 07/10/2021 Mr. Damari Suastegui is a 66 year old male with a past medical history of left rotator cuff status post shoulder surgery 04/18/2021, hypertension that presents to the clinic for a 6-week history of nonhealing ulcer to the left elbow. He has been wearing a sling for the past 2 to 3 months and this created a pressure injury to his left elbow that eventually opened. He has been using Betadine to the area and keeping it covered. He has cut his sling to relieve some of the pressure. He does sleep in a recliner putting pressure on the armrest to his elbow. He currently denies signs of infection. Patient History Allergies No Known Allergies Social History Never smoker, Marital Status - Married, Alcohol Use - Rarely, Drug Use - No History, Caffeine Use - Daily. Medical History Cardiovascular Patient has history of Hypertension Review of Systems (ROS) Eyes Complains or has symptoms of Glasses / Contacts. Objective Constitutional Vitals Time Taken: 8:56 AM, Height: 69 in, Source: Stated, Weight: 189  lbs, Source: Stated, BMI: 27.9, Temperature: 98.4 F, Pulse: 79 bpm, Respiratory Rate: 18 breaths/min, Blood Pressure: 142/78 mmHg. General Notes: Left elbow: Open wound with granulation tissue and scant nonviable tissue. No surrounding signs of infection. Circumferential undermining. No drainage noted Integumentary (Hair, Skin) Wound #1 status is Open. Original cause of wound was Pressure Injury. The date acquired was: 06/24/2021. The wound is located on the Left Elbow. The wound measures 0.7cm length x 0.5cm width x 0.3cm depth; 0.275cm^2 area and 0.082cm^3 volume. There is Fat Layer (Subcutaneous Tissue) exposed. There is no tunneling noted, however, there is undermining starting at 12:00 and ending at  12:00 with a maximum distance of 1.5cm. There is a medium amount of serosanguineous drainage noted. There is small (1-33%) pink granulation within the wound bed. There is a large (67-100%) amount of necrotic tissue within the wound bed including Adherent Slough. Assessment Active Problems GLENDON, DUNWOODY ([id number]) ICD-10 Pressure ulcer of unspecified elbow, stage 3 Unspecified rotator cuff tear or rupture of left shoulder, not specified as traumatic Essential (primary) hypertension Patient presents with a 6-week history of nonhealing ulcer caused by pressure from his arm sling status post shoulder surgery at the end of February. We discussed the importance of aggressive offloading to this area for healing. He expressed understanding. I am concerned for bioburden to this area based on exam and I recommended a PCR culture as he would likely do well with [facility] antibiotic. At this time he would like to wait to see if his insurance covers the PCR culture. In the meantime I will start mupirocin ointment that he can use daily along with collagen. Follow-up in 1 week. 46 minutes was spent on the encounter including face-to-face, EMR review and coordination of care Procedures Wound #1 Pre-procedure diagnosis of Wound #1 is a Pressure Ulcer located on the Left Elbow . There was a Chemical/Enzymatic/Mechanical debridement performed by Kalman Shan, MD. With the following instrument(s): Curette. Other agent used was Saline and gauze. A time out was conducted at 09:24, prior to the start of the procedure. A Minimum amount of bleeding was controlled with Pressure. The procedure was tolerated well with a pain level of 0 throughout and a pain level of 0 following the procedure. Post Debridement Measurements: 0.7cm length x 0.5cm width x 0.3cm depth; 0.082cm^3 volume. Post debridement Stage noted as Category/Stage III. Character of Wound/Ulcer Post Debridement is improved. Post procedure Diagnosis  Wound #1: Same as Pre-Procedure Plan Follow-up Appointments: Return Appointment in 1 week. Bathing/ Shower/ Hygiene: May shower; gently cleanse wound with antibacterial soap, rinse and pat dry prior to dressing wounds WOUND #1: - Elbow Wound Laterality: Left Cleanser: Byram Ancillary Kit - 15 Day Supply (DME) (Generic) 1 x Per Day/30 Days Discharge Instructions: Use supplies as instructed; Kit contains: (15) Saline Bullets; (15) 3x3 Gauze; 15 pr Gloves Primary Dressing: Prisma 4.34 (in) (DME) (Generic) 1 x Per Day/30 Days Discharge Instructions: Moisten w/normal saline or sterile water; Cover wound as directed. Do not remove from wound bed. Secondary Dressing: (SILCONE BORDER) Zetuvit Plus SILICONE BORDER Dressing 5x5 (in/in) (DME) (Generic) 1 x Per Day/30 Days Discharge Instructions: Please do not put silicone bordered dressings under wraps. Use non-bordered dressing only. 1. Collagen and mupirocin ointment 2. PCR culture we will send off per patient approval 3. Follow-up in 1 week Electronic Signature(s) Signed: 07/10/2021 10:21:42 AM By: Kalman Shan DO Entered By: Kalman Shan on 07/10/2021 09:46:42 Jason [name] ([id number]) -------------------------------------------------------------------------------- ROS/PFSH Details Patient Name: Jason [name] Date of  Service: 07/10/2021 8:45 AM Medical Record Number: [id number] Patient Account Number: [id number] Date of Birth/Sex: [birth year] (66 y.o. Male) Treating RN: Carlene Coria Primary Care Provider: Viviana Simpler Other Clinician: Referring Provider: Renee Harder Treating Provider/Extender: Yaakov Guthrie in Treatment: 0 Eyes Complaints and Symptoms: Positive for: Glasses / Contacts Cardiovascular Medical History: Positive for: Hypertension Immunizations Pneumococcal Vaccine: Received Pneumococcal Vaccination: No Implantable Devices None Family and Social History Never smoker; Marital Status -  Married; Alcohol Use: Rarely; Drug Use: No History; Caffeine Use: Daily Electronic Signature(s) Signed: 07/10/2021 10:21:42 AM By: Kalman Shan DO Signed: 07/11/2021 4:24:09 PM By: Carlene Coria RN Entered By: Carlene Coria on 07/10/2021 08:59:15 Jason [name] ([id number]) -------------------------------------------------------------------------------- SuperBill Details Patient Name: Jason [name]. Date of Service: 07/10/2021 Medical Record Number: [id number] Patient Account Number: [id number] Date of Birth/Sex: 06-Feb-1956 (66 y.o. 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, stage 3 M75.102 Unspecified rotator cuff tear or rupture of left shoulder, not specified as traumatic I10 Essential (primary) hypertension Facility Procedures CPT4 Code: [id number] Description: [phone] - WOUND CARE VISIT-LEV 2 EST PT Modifier: Quantity: 1 CPT4 Code: [id number] Description: 82060 - DEBRIDE W/O ANES NON SELECT Modifier: Quantity: 1 Physician Procedures CPT4 Code: [id number] Description: 94327 - WC PHYS LEVEL 4 - NEW PT Modifier: Quantity: 1 CPT4 Code: Description: ICD-10 Diagnosis Description 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

## 2021-07-12 NOTE — Progress Notes (Signed)
[name], [name] ([id number]) Visit Report for 07/10/2021 Allergy List Details Patient Name: Jason [name]. Date of Service: 07/10/2021 8:45 AM Medical Record Number: [id number] Patient Account Number: [id number] Date of Birth/Sex: 05-24-1955 (66 y.o. Male) Treating RN: Carlene Coria Primary Care Brinlee Gambrell: Viviana Simpler Other Clinician: Referring Kember Boch: Renee Harder Treating Kyrene Longan/Extender: Yaakov Guthrie in Treatment: 0 Allergies Active Allergies No Known Allergies Allergy Notes Electronic Signature(s) Signed: 07/11/2021 4:24:09 PM By: Carlene Coria RN Entered By: Carlene Coria on 07/10/2021 08:57:17 Jason [name] ([id number]) -------------------------------------------------------------------------------- Arrival Information Details Patient Name: Jason [name] Date of Service: 07/10/2021 8:45 AM Medical Record Number: [id number] Patient Account Number: [id number] Date of Birth/Sex: 06-Mar-1955 (66 y.o. Male) Treating RN: Carlene Coria Primary Care Leighton Luster: Viviana Simpler Other Clinician: Referring Darwyn Ponzo: Renee Harder Treating Darral Rishel/Extender: Yaakov Guthrie in Treatment: 0 Visit Information Patient Arrived: Ambulatory Arrival Time: 08:49 Accompanied By: wife Transfer Assistance: None Patient Identification Verified: Yes Secondary Verification Process Completed: Yes Patient Requires Transmission-Based Precautions: No Patient Has Alerts: No Electronic Signature(s) Signed: 07/11/2021 4:24:09 PM By: Carlene Coria RN Entered By: Carlene Coria on 07/10/2021 08:55:52 Jason [name] ([id number]) -------------------------------------------------------------------------------- Clinic Level of Care Assessment Details Patient Name: Jason [name] Date of Service: 07/10/2021 8:45 AM Medical Record Number: [id number] Patient Account Number: [id number] Date of Birth/Sex: 09-Nov-1955 (66 y.o. Male) Treating RN: Carlene Coria Primary Care  Lilly Gasser: Viviana Simpler Other Clinician: Referring Javier Gell: Renee Harder Treating Chrsitopher Wik/Extender: Yaakov Guthrie in Treatment: 0 Clinic Level of Care Assessment Items TOOL 4 Quantity Score X - Use when only an EandM is performed on FOLLOW-UP visit 1 0 ASSESSMENTS - Nursing Assessment / Reassessment X - Reassessment of Co-morbidities (includes updates in patient status) 1 10 X- 1 5 Reassessment of Adherence to Treatment Plan ASSESSMENTS - Wound and Skin Assessment / Reassessment X - Simple Wound Assessment / Reassessment - one wound 1 5 _0  - 0 Complex Wound Assessment / Reassessment - multiple wounds _1  - 0 Dermatologic / Skin Assessment (not related to wound area) ASSESSMENTS - Focused Assessment _2  - Circumferential Edema Measurements - multi extremities 0 _3  - 0 Nutritional Assessment / Counseling / Intervention _4  - 0 Lower Extremity Assessment (monofilament, tuning fork, pulses) _5  - 0 Peripheral Arterial Disease Assessment (using hand held doppler) ASSESSMENTS - Ostomy and/or Continence Assessment and Care _6  - Incontinence Assessment and Management 0 _7  - 0 Ostomy Care Assessment and Management (repouching, etc.) PROCESS - Coordination of Care X - Simple Patient / Family Education for ongoing care 1 15 _8  - 0 Complex (extensive) Patient / Family Education for ongoing care _9  - 0 Staff obtains Programmer, systems, Records, Test Results / Process Orders _10  - 0 Staff telephones HHA, Nursing Homes / Clarify orders / etc _11  - 0 Routine Transfer to another Facility (non-emergent condition) _12  - 0 Routine Hospital Admission (non-emergent condition) _13  - 0 New Admissions / Biomedical engineer / Ordering NPWT, Apligraf, etc. _14  - 0 Emergency Hospital Admission (emergent condition) _15  - 0 Simple Discharge Coordination _16  - 0 Complex (extensive) Discharge Coordination PROCESS - Special Needs _17  - Pediatric / Minor Patient Management 0 _18  - 0 Isolation  Patient Management _19  - 0 Hearing / Language / Visual special needs _20  - 0 Assessment of Community assistance (transportation, D/C planning, etc.) _21  - 0 Additional assistance / Altered mentation _22  - 0 Support Surface(s) Assessment (bed, cushion, seat, etc.) INTERVENTIONS - Wound Cleansing / Measurement Knechtel, Hence E. ([id number]) X- 1 5 Simple Wound Cleansing - one wound _23  - 0  Complex Wound Cleansing - multiple wounds X- 1 5 Wound Imaging (photographs - any number of wounds) _0  - 0 Wound Tracing (instead of photographs) X- 1 5 Simple Wound Measurement - one wound _1  - 0 Complex Wound Measurement - multiple wounds INTERVENTIONS - Wound Dressings X - Small Wound Dressing one or multiple wounds 1 10 _2  - 0 Medium Wound Dressing one or multiple wounds _3  - 0 Large Wound Dressing one or multiple wounds <TJQZESPQZRAQTMAU>_6<\/JFHLKTGYBWLSLHTD>_4  - 0 Application of Medications - topical <KAJGOTLXBWIOMBTD>_9<\/RCBULAGTXMIWOEHO>_1  - 0 Application of Medications - injection INTERVENTIONS - Miscellaneous _6  - External ear exam 0 _7  - 0 Specimen Collection (cultures, biopsies, blood, body fluids, etc.) _8  - 0 Specimen(s) / Culture(s) sent or taken to Lab for analysis _9  - 0 Patient Transfer (multiple staff / Civil Service fast streamer / Similar devices) _10  - 0 Simple Staple / Suture removal (25 or less) _11  - 0 Complex Staple / Suture removal (26 or more) _12  - 0 Hypo / Hyperglycemic Management (close monitor of Blood Glucose) _13  - 0 Ankle / Brachial Index (ABI) - do not check if billed separately X- 1 5 Vital Signs Has the patient been seen at the hospital within the last three years: Yes Total Score: 65 Level Of Care: New/Established - Level 2 Electronic Signature(s) Signed: 07/11/2021 4:24:09 PM By: Carlene Coria RN Entered By: Carlene Coria on 07/10/2021 09:30:11 Jason [name] ([id number]) -------------------------------------------------------------------------------- Encounter Discharge Information Details Patient Name: Jason [name]. Date of  Service: 07/10/2021 8:45 AM Medical Record Number: [id number] Patient Account Number: [id number] Date of Birth/Sex: 1955-10-19 (66 y.o. Male) Treating RN: Carlene Coria Primary Care Leandrew Keech: Viviana Simpler Other Clinician: Referring Arasely Akkerman: Renee Harder Treating Kaion Tisdale/Extender: Yaakov Guthrie in Treatment: 0 Encounter Discharge Information Items Post Procedure Vitals Discharge Condition: Stable Temperature (F): 98.4 Ambulatory Status: Ambulatory Pulse (bpm): 79 Discharge Destination: Home Respiratory Rate (breaths/min): 18 Transportation: Private Auto Blood Pressure (mmHg): 142/78 Accompanied By: wife Schedule Follow-up Appointment: Yes Clinical Summary of Care: Patient Declined Electronic Signature(s) Signed: 07/11/2021 4:24:09 PM By: Carlene Coria RN Entered By: Carlene Coria on 07/10/2021 09:32:55 Jason [name] ([id number]) -------------------------------------------------------------------------------- Lower Extremity Assessment Details Patient Name: Jason [name]. Date of Service: 07/10/2021 8:45 AM Medical Record Number: [id number] Patient Account Number: [id number] Date of Birth/Sex: 14-Sep-1955 (66 y.o. Male) Treating RN: Carlene Coria Primary Care Nolah Krenzer: Viviana Simpler Other Clinician: Referring Britain Anagnos: Renee Harder Treating Vali Capano/Extender: Yaakov Guthrie in Treatment: 0 Electronic Signature(s) Signed: 07/11/2021 4:24:09 PM By: Carlene Coria RN Entered By: Carlene Coria on 07/10/2021 08:57:03 Jason [name] ([id number]) -------------------------------------------------------------------------------- Multi Wound Chart Details Patient Name: Jason [name]. Date of Service: 07/10/2021 8:45 AM Medical Record Number: [id number] Patient Account Number: [id number] Date of Birth/Sex: 11-17-1955 (66 y.o. Male) Treating RN: Carlene Coria Primary Care Desteni Piscopo: Viviana Simpler Other Clinician: Referring Baraa Tubbs: Renee Harder Treating Anahla Bevis/Extender: Yaakov Guthrie in Treatment: 0 Vital Signs Height(in): 69 Pulse(bpm): 79 Weight(lbs): 189 Blood Pressure(mmHg): 142/78 Body Mass Index(BMI): 27.9 Temperature(F): 98.4 Respiratory Rate(breaths/min): 18 Photos: [N/A:N/A] Wound Location: Left Elbow N/A N/A Wounding Event: Pressure Injury N/A N/A Primary Etiology: Pressure Ulcer N/A N/A Comorbid History: Hypertension N/A N/A Date Acquired: 06/24/2021 N/A N/A Weeks of Treatment: 0 N/A N/A Wound Status: Open N/A N/A Wound Recurrence: No N/A N/A Measurements L x W x D (cm) 0.7x0.5x0.3 N/A N/A Area (cm) : 0.275 N/A N/A Volume (cm) : 0.082 N/A N/A Starting Position 1 (o'clock):12 Ending Position 1 (o'clock): 12 Maximum Distance 1 (cm): 1.5 Undermining: Yes N/A N/A Classification: Category/Stage III  N/A N/A Exudate Amount: Medium N/A N/A Exudate Type: Serosanguineous N/A N/A Exudate Color: red, brown N/A N/A Granulation Amount: Small (1-33%) N/A N/A Granulation Quality: Pink N/A N/A Necrotic Amount: Large (67-100%) N/A N/A Exposed Structures: Fat Layer (Subcutaneous Tissue): N/A N/A Yes Fascia: No Tendon: No Muscle: No Joint: No Bone: No Epithelialization: None N/A N/A Debridement: Chemical/Enzymatic/Mechanical N/A N/A Pre-procedure Verification/Time 09:24 N/A N/A Out Taken: Instrument: Curette N/A N/A Bleeding: Minimum N/A N/A Hemostasis Achieved: Pressure N/A N/A Procedural Pain: 0 N/A N/A Post Procedural Pain: 0 N/A N/A Debridement Treatment Procedure was tolerated well N/A N/A Response: Post Debridement 0.7x0.5x0.3 N/A N/A Measurements L x W x D (cm) IRBY, FAILS E. ([id number]) Post Debridement Volume: 0.082 N/A N/A (cm) Post Debridement Stage: Category/Stage III N/A N/A Procedures Performed: Debridement N/A N/A Treatment Notes Wound #1 (Elbow) Wound Laterality: Left Cleanser Byram Ancillary Kit - 15 Day Supply Discharge Instruction: Use supplies as  instructed; Kit contains: (15) Saline Bullets; (15) 3x3 Gauze; 15 pr Gloves Peri-Wound Care Topical Primary Dressing Prisma 4.34 (in) Discharge Instruction: Moisten w/normal saline or sterile water; Cover wound as directed. Do not remove from wound bed. Secondary Dressing (SILCONE BORDER) Zetuvit Plus SILICONE BORDER Dressing 5x5 (in/in) Discharge Instruction: Please do not put silicone bordered dressings under wraps. Use non-bordered dressing only. Secured With Compression Wrap Compression Stockings Add-Ons Electronic Signature(s) Signed: 07/10/2021 10:21:42 AM By: Kalman Shan DO Entered By: Kalman Shan on 07/10/2021 09:33:25 Jason [name] ([id number]) -------------------------------------------------------------------------------- [place] Details Patient Name: Jason [name] Date of Service: 07/10/2021 8:45 AM Medical Record Number: [id number] Patient Account Number: [id number] Date of Birth/Sex: 17-Nov-1955 (66 y.o. Male) Treating RN: Carlene Coria Primary Care Amogh Komatsu: Viviana Simpler Other Clinician: Referring Floreen Teegarden: Renee Harder Treating Trenika Hudson/Extender: Yaakov Guthrie in Treatment: 0 Active Inactive Wound/Skin Impairment Nursing Diagnoses: Knowledge deficit related to ulceration/compromised skin integrity Goals: Patient/caregiver will verbalize understanding of skin care regimen Date Initiated: 07/10/2021 Target Resolution Date: 08/10/2021 Goal Status: Active Ulcer/skin breakdown will have a volume reduction of 30% by week 4 Date Initiated: 07/10/2021 Target Resolution Date: 08/10/2021 Goal Status: Active Ulcer/skin breakdown will have a volume reduction of 50% by week 8 Date Initiated: 07/10/2021 Target Resolution Date: 09/09/2021 Goal Status: Active Ulcer/skin breakdown will have a volume reduction of 80% by week 12 Date Initiated: 07/10/2021 Target Resolution Date: 10/10/2021 Goal Status: Active Ulcer/skin  breakdown will heal within 14 weeks Date Initiated: 07/10/2021 Target Resolution Date: 11/10/2021 Goal Status: Active Interventions: Assess patient/caregiver ability to obtain necessary supplies Assess patient/caregiver ability to perform ulcer/skin care regimen upon admission and as needed Assess ulceration(s) every visit Notes: Electronic Signature(s) Signed: 07/11/2021 4:24:09 PM By: Carlene Coria RN Entered By: Carlene Coria on 07/10/2021 09:23:00 Jason [name] ([id number]) -------------------------------------------------------------------------------- Pain Assessment Details Patient Name: Jason [name]. Date of Service: 07/10/2021 8:45 AM Medical Record Number: [id number] Patient Account Number: [id number] Date of Birth/Sex: [birth year] (66 y.o. Male) Treating RN: Carlene Coria Primary Care Luan Maberry: Viviana Simpler Other Clinician: Referring Yaira Bernardi: Renee Harder Treating Abenezer Odonell/Extender: Yaakov Guthrie in Treatment: 0 Active Problems Location of Pain Severity and Description of Pain Patient Has Paino No Site Locations Pain Management and Medication Current Pain Management: Electronic Signature(s) Signed: 07/11/2021 4:24:09 PM By: Carlene Coria RN Entered By: Carlene Coria on 07/10/2021 08:56:10 Jason [name] ([id number]) -------------------------------------------------------------------------------- Patient/Caregiver Education Details Patient Name: Jason [name]. Date of Service: 07/10/2021 8:45 AM Medical Record Number: [id number] Patient Account Number: [id number] Date of Birth/Gender: 1955-12-01 (66 y.o. Male) Treating RN: Carlene Coria Primary  Care Physician: Viviana Simpler Other Clinician: Referring Physician: Renee Harder Treating Physician/Extender: Yaakov Guthrie in Treatment: 0 Education Assessment Education Provided To: Patient Education Topics Provided Wound/Skin Impairment: Methods: Explain/Verbal Responses: State  content correctly Electronic Signature(s) Signed: 07/11/2021 4:24:09 PM By: Carlene Coria RN Entered By: Carlene Coria on 07/10/2021 09:30:37 Jason [name] ([id number]) -------------------------------------------------------------------------------- Wound Assessment Details Patient Name: Jason [name] Date of Service: 07/10/2021 8:45 AM Medical Record Number: [id number] Patient Account Number: [id number] Date of Birth/Sex: 04/22/1955 (66 y.o. Male) Treating RN: Carlene Coria Primary Care Vijay Durflinger: Viviana Simpler Other Clinician: Referring Brendia Dampier: Renee Harder Treating Jazae Gandolfi/Extender: Yaakov Guthrie in Treatment: 0 Wound Status Wound Number: 1 Primary Etiology: Pressure Ulcer Wound Location: Left Elbow Wound Status: Open Wounding Event: Pressure Injury Comorbid History: Hypertension Date Acquired: 06/24/2021 Weeks Of Treatment: 0 Clustered Wound: No Photos Wound Measurements Length: (cm) 0.7 % Redu Width: (cm) 0.5 % Redu Depth: (cm) 0.3 Epithe Area: (cm) 0.275 [place] Volume: (cm) 0.082 Under Sta End Max ction in Area: ction in Volume: lialization: None ling: No mining: Yes rting Position (o'clock): 12 ing Position (o'clock): 12 imum Distance: (cm) 1.5 Wound Description Classification: Category/Stage III Foul O Exudate Amount: Medium Slough Exudate Type: Serosanguineous Exudate Color: red, brown dor After Cleansing: No /Fibrino Yes Wound Bed Granulation Amount: Small (1-33%) Exposed Structure Granulation Quality: Pink Fascia Exposed: No Necrotic Amount: Large (67-100%) Fat Layer (Subcutaneous Tissue) Exposed: Yes Necrotic Quality: Adherent Slough Tendon Exposed: No Muscle Exposed: No Joint Exposed: No Bone Exposed: No Treatment Notes Wound #1 (Elbow) Wound Laterality: Left Cleanser [place]ram Ancillary Kit - [address] Day Supply RUSLAN, MCCABE ([id number]) Discharge Instruction: Use supplies as instructed; Kit contains: (15) Saline Bullets;  (15) 3x3 Gauze; 15 pr Gloves Peri-Wound Care Topical Primary Dressing Prisma 4.34 (in) Discharge Instruction: Moisten w/normal saline or sterile water; Cover wound as directed. Do not remove from wound bed. Secondary Dressing (SILCONE BORDER) Zetuvit Plus SILICONE BORDER Dressing 5x5 (in/in) Discharge Instruction: Please do not put silicone bordered dressings under wraps. Use non-bordered dressing only. Secured With Compression Wrap Compression Stockings Add-Ons Electronic Signature(s) Signed: 07/11/2021 4:24:09 PM By: Carlene Coria RN Entered By: Carlene Coria on 07/10/2021 09:11:24 Jason [name] ([id number]) -------------------------------------------------------------------------------- Vitals Details Patient Name: Jason [name]. Date of Service: 07/10/2021 8:45 AM Medical Record Number: [id number] Patient Account Number: [id number] Date of Birth/Sex: [birth year] (66 y.o. Male) Treating RN: Carlene Coria Primary Care Brittanee Ghazarian: Viviana Simpler Other Clinician: Referring Devonne Lalani: Renee Harder Treating Halden Phegley/Extender: Yaakov Guthrie in Treatment: 0 Vital Signs Time Taken: 08:56 Temperature (F): 98.4 Height (in): 69 Pulse (bpm): 79 Source: Stated Respiratory Rate (breaths/min): 18 Weight (lbs): 189 Blood Pressure (mmHg): 142/78 Source: Stated Reference Range: 80 - 120 mg / dl Body Mass Index (BMI): 27.9 Electronic Signature(s) Signed: 07/11/2021 4:24:09 PM By: Carlene Coria RN Entered By: Carlene Coria on 07/10/2021 08:56:46

## 2021-07-15 DIAGNOSIS — M25512 Pain in left shoulder: Secondary | ICD-10-CM | POA: Diagnosis not present

## 2021-07-15 DIAGNOSIS — M25612 Stiffness of left shoulder, not elsewhere classified: Secondary | ICD-10-CM | POA: Diagnosis not present

## 2021-07-17 ENCOUNTER — Encounter (HOSPITAL_BASED_OUTPATIENT_CLINIC_OR_DEPARTMENT_OTHER): Payer: BC Managed Care – PPO | Admitting: Internal Medicine

## 2021-07-17 DIAGNOSIS — M75102 Unspecified rotator cuff tear or rupture of left shoulder, not specified as traumatic: Secondary | ICD-10-CM | POA: Diagnosis not present

## 2021-07-17 DIAGNOSIS — L89003 Pressure ulcer of unspecified elbow, stage 3: Secondary | ICD-10-CM

## 2021-07-17 DIAGNOSIS — I10 Essential (primary) hypertension: Secondary | ICD-10-CM | POA: Diagnosis not present

## 2021-07-17 NOTE — Progress Notes (Signed)
[name], [name] ([id number]) Visit Report for 07/17/2021 Arrival Information Details Patient Name: Jason [name], Jason [name]. Date of Service: 07/17/2021 3:00 PM Medical Record Number: [id number] Patient Account Number: [id number] Date of Birth/Sex: [birth year] (65 y.o. M) Treating RN: Alycia Rossetti Primary Care Dondi Burandt: Viviana Simpler Other Clinician: Referring Kuzey Ogata: Viviana Simpler Treating Edmund Rick/Extender: Yaakov Guthrie in Treatment: 1 Visit Information History Since Last Visit Added or deleted any medications: No Patient Arrived: Ambulatory Any new allergies or adverse reactions: No Arrival Time: 14:41 Had a fall or experienced change in No Accompanied By: Wife activities of daily living that may affect Transfer Assistance: None risk of falls: Patient Requires Transmission-Based Precautions: No Hospitalized since last visit: No Patient Has Alerts: No Has Dressing in Place as Prescribed: Yes Pain Present Now: No Electronic Signature(s) Signed: 07/17/2021 4:54:43 PM By: Alycia Rossetti Entered By: Alycia Rossetti on 07/17/2021 15:08:53 Jason [name] ([id number]) -------------------------------------------------------------------------------- Clinic Level of Care Assessment Details Patient Name: Jason [name] Date of Service: 07/17/2021 3:00 PM Medical Record Number: [id number] Patient Account Number: [id number] Date of Birth/Sex: 1955-03-11 (65 y.o. M) Treating RN: Alycia Rossetti Primary Care Margene Cherian: Viviana Simpler Other Clinician: Referring Aki Abalos: Viviana Simpler Treating Issaiah Seabrooks/Extender: Yaakov Guthrie in Treatment: 1 Clinic Level of Care Assessment Items TOOL 4 Quantity Score X - Use when only an EandM is performed on FOLLOW-UP visit 1 0 ASSESSMENTS - Nursing Assessment / Reassessment X - Reassessment of Co-morbidities (includes updates in patient status) 1 10 X- 1 5 Reassessment of Adherence to Treatment Plan ASSESSMENTS - Wound and Skin  Assessment / Reassessment X - Simple Wound Assessment / Reassessment - one wound 1 5 '[]'  - 0 Complex Wound Assessment / Reassessment - multiple wounds '[]'  - 0 Dermatologic / Skin Assessment (not related to wound area) ASSESSMENTS - Focused Assessment '[]'  - Circumferential Edema Measurements - multi extremities 0 '[]'  - 0 Nutritional Assessment / Counseling / Intervention '[]'  - 0 Lower Extremity Assessment (monofilament, tuning fork, pulses) '[]'  - 0 Peripheral Arterial Disease Assessment (using hand held doppler) ASSESSMENTS - Ostomy and/or Continence Assessment and Care '[]'  - Incontinence Assessment and Management 0 '[]'  - 0 Ostomy Care Assessment and Management (repouching, etc.) PROCESS - Coordination of Care X - Simple Patient / Family Education for ongoing care 1 15 '[]'  - 0 Complex (extensive) Patient / Family Education for ongoing care '[]'  - 0 Staff obtains Programmer, systems, Records, Test Results / Process Orders '[]'  - 0 Staff telephones HHA, Nursing Homes / Clarify orders / etc '[]'  - 0 Routine Transfer to another Facility (non-emergent condition) '[]'  - 0 Routine Hospital Admission (non-emergent condition) '[]'  - 0 New Admissions / Biomedical engineer / Ordering NPWT, Apligraf, etc. '[]'  - 0 Emergency Hospital Admission (emergent condition) X- 1 10 Simple Discharge Coordination '[]'  - 0 Complex (extensive) Discharge Coordination PROCESS - Special Needs '[]'  - Pediatric / Minor Patient Management 0 '[]'  - 0 Isolation Patient Management '[]'  - 0 Hearing / Language / Visual special needs '[]'  - 0 Assessment of Community assistance (transportation, D/C planning, etc.) '[]'  - 0 Additional assistance / Altered mentation '[]'  - 0 Support Surface(s) Assessment (bed, cushion, seat, etc.) INTERVENTIONS - Wound Cleansing / Measurement Jason [name], Jason [name]. ([id number]) X- 1 5 Simple Wound Cleansing - one wound '[]'  - 0 Complex Wound Cleansing - multiple wounds X- 1 5 Wound Imaging (photographs - any number  of wounds) '[]'  - 0 Wound Tracing (instead of photographs) X- 1 5 Simple Wound Measurement - one wound '[]'  - 0 Complex Wound Measurement -  multiple wounds INTERVENTIONS - Wound Dressings X - Small Wound Dressing one or multiple wounds 1 10 '[]'  - 0 Medium Wound Dressing one or multiple wounds '[]'  - 0 Large Wound Dressing one or multiple wounds X- 1 5 Application of Medications - topical '[]'  - 0 Application of Medications - injection INTERVENTIONS - Miscellaneous '[]'  - External ear exam 0 '[]'  - 0 Specimen Collection (cultures, biopsies, blood, body fluids, etc.) '[]'  - 0 Specimen(s) / Culture(s) sent or taken to Lab for analysis '[]'  - 0 Patient Transfer (multiple staff / Civil Service fast streamer / Similar devices) '[]'  - 0 Simple Staple / Suture removal (25 or less) '[]'  - 0 Complex Staple / Suture removal (26 or more) '[]'  - 0 Hypo / Hyperglycemic Management (close monitor of Blood Glucose) '[]'  - 0 Ankle / Brachial Index (ABI) - do not check if billed separately X- 1 5 Vital Signs Has the patient been seen at the hospital within the last three years: Yes Total Score: 80 Level Of Care: New/Established - Level 3 Electronic Signature(s) Signed: 07/17/2021 4:54:43 PM By: Alycia Rossetti Entered By: Alycia Rossetti on 07/17/2021 16:11:48 Jason [name] ([id number]) -------------------------------------------------------------------------------- Encounter Discharge Information Details Patient Name: Jason [name]. Date of Service: 07/17/2021 3:00 PM Medical Record Number: [id number] Patient Account Number: [id number] Date of Birth/Sex: 09-25-1955 (65 y.o. M) Treating RN: Alycia Rossetti Primary Care Caleyah Jr: Viviana Simpler Other Clinician: Referring Tayvian Holycross: Viviana Simpler Treating Worth Kober/Extender: Yaakov Guthrie in Treatment: 1 Encounter Discharge Information Items Discharge Condition: Stable Ambulatory Status: Ambulatory Discharge Destination: Home Transportation: Private  Auto Accompanied By: Wife Schedule Follow-up Appointment: Yes Clinical Summary of Care: Electronic Signature(s) Signed: 07/17/2021 4:14:06 PM By: Alycia Rossetti Entered By: Alycia Rossetti on 07/17/2021 16:14:06 Jason [name] ([id number]) -------------------------------------------------------------------------------- Lower Extremity Assessment Details Patient Name: Jason [name] Date of Service: 07/17/2021 3:00 PM Medical Record Number: [id number] Patient Account Number: [id number] Date of Birth/Sex: 07-08-1955 (65 y.o. M) Treating RN: Alycia Rossetti Primary Care Loralee Weitzman: Viviana Simpler Other Clinician: Referring Demaya [name]: Viviana Simpler Treating Alaina Donati/Extender: Yaakov Guthrie in Treatment: 1 Electronic Signature(s) Signed: 07/17/2021 4:54:43 PM By: Alycia Rossetti Entered By: Alycia Rossetti on 07/17/2021 15:54:49 Jason [name] ([id number]) -------------------------------------------------------------------------------- Multi Wound Chart Details Patient Name: Jason [name]. Date of Service: 07/17/2021 3:00 PM Medical Record Number: [id number] Patient Account Number: [id number] Date of Birth/Sex: [birth year] (65 y.o. M) Treating RN: Alycia Rossetti Primary Care Preethi Scantlebury: Viviana Simpler Other Clinician: Referring Azion Centrella: Viviana Simpler Treating Kalieb Freeland/Extender: Yaakov Guthrie in Treatment: 1 Vital Signs Height(in): 69 Pulse(bpm): 86 Weight(lbs): 189 Blood Pressure(mmHg): 147/86 Body Mass Index(BMI): 27.9 Temperature(F): 97.9 Respiratory Rate(breaths/min): 16 Photos: [N/A:N/A] Wound Location: Left Elbow N/A N/A Wounding Event: Pressure Injury N/A N/A Primary Etiology: Pressure Ulcer N/A N/A Comorbid History: Hypertension N/A N/A Date Acquired: 06/24/2021 N/A N/A Weeks of Treatment: 1 N/A N/A Wound Status: Open N/A N/A Wound Recurrence: No N/A N/A Measurements L x W x D (cm) 0.5x0.5x0.2 N/A N/A Area (cm) : 0.196 N/A N/A Volume (cm) : 0.039 N/A  N/A % Reduction in Area: 28.70% N/A N/A % Reduction in Volume: 52.40% N/A N/A Classification: Category/Stage III N/A N/A Exudate Amount: Medium N/A N/A Exudate Type: Serosanguineous N/A N/A Exudate Color: red, brown N/A N/A Granulation Amount: Small (1-33%) N/A N/A Granulation Quality: Pink N/A N/A Necrotic Amount: Large (67-100%) N/A N/A Exposed Structures: Fat Layer (Subcutaneous Tissue): N/A N/A Yes Fascia: No Tendon: No Muscle: No Joint: No Bone: No Epithelialization: None N/A N/A Treatment Notes Electronic Signature(s) Signed: 07/17/2021 4:54:43 PM By:  Alycia Rossetti Entered By: Alycia Rossetti on 07/17/2021 15:23:49 Jason [name] ([id number]) -------------------------------------------------------------------------------- [place] Details Patient Name: Jason [name], Jason [name]. Date of Service: 07/17/2021 3:00 PM Medical Record Number: [id number] Patient Account Number: [id number] Date of Birth/Sex: May 04, 1955 (65 y.o. M) Treating RN: Alycia Rossetti Primary Care Amiliah Campisi: Viviana Simpler Other Clinician: Referring Sherese Heyward: Viviana Simpler Treating Charie Pinkus/Extender: Yaakov Guthrie in Treatment: 1 Active Inactive Wound/Skin Impairment Nursing Diagnoses: Knowledge deficit related to ulceration/compromised skin integrity Goals: Patient/caregiver will verbalize understanding of skin care regimen Date Initiated: 07/10/2021 Target Resolution Date: 08/10/2021 Goal Status: Active Ulcer/skin breakdown will have a volume reduction of 30% by week 4 Date Initiated: 07/10/2021 Target Resolution Date: 08/10/2021 Goal Status: Active Ulcer/skin breakdown will have a volume reduction of 50% by week 8 Date Initiated: 07/10/2021 Target Resolution Date: 09/09/2021 Goal Status: Active Ulcer/skin breakdown will have a volume reduction of 80% by week 12 Date Initiated: 07/10/2021 Target Resolution Date: 10/10/2021 Goal Status: Active Ulcer/skin breakdown will heal within  14 weeks Date Initiated: 07/10/2021 Target Resolution Date: 11/10/2021 Goal Status: Active Interventions: Assess patient/caregiver ability to obtain necessary supplies Assess patient/caregiver ability to perform ulcer/skin care regimen upon admission and as needed Assess ulceration(s) every visit Notes: Electronic Signature(s) Signed: 07/17/2021 4:54:43 PM By: Alycia Rossetti Entered By: Alycia Rossetti on 07/17/2021 15:23:39 Jason [name] ([id number]) -------------------------------------------------------------------------------- Pain Assessment Details Patient Name: Jason [name]. Date of Service: 07/17/2021 3:00 PM Medical Record Number: [id number] Patient Account Number: [id number] Date of Birth/Sex: [birth year] (65 y.o. M) Treating RN: Alycia Rossetti Primary Care Kemiah Booz: Viviana Simpler Other Clinician: Referring Aoki Wedemeyer: Viviana Simpler Treating Kember Boch/Extender: Yaakov Guthrie in Treatment: 1 Active Problems Location of Pain Severity and Description of Pain Patient Has Paino No Site Locations Pain Management and Medication Current Pain Management: Electronic Signature(s) Signed: 07/17/2021 4:54:43 PM By: Alycia Rossetti Entered By: Alycia Rossetti on 07/17/2021 15:10:24 Jason [name] ([id number]) -------------------------------------------------------------------------------- Patient/Caregiver Education Details Patient Name: Jason [name]. Date of Service: 07/17/2021 3:00 PM Medical Record Number: [id number] Patient Account Number: [id number] Date of Birth/Gender: 1955/11/08 (66 y.o. M) Treating RN: Alycia Rossetti Primary Care Physician: Viviana Simpler Other Clinician: Referring Physician: Viviana Simpler Treating Physician/Extender: Yaakov Guthrie in Treatment: 1 Education Assessment Education Provided To: Patient Education Topics Provided Wound/Skin Impairment: Methods: Demonstration, Explain/Verbal Responses: State content correctly Electronic  Signature(s) Signed: 07/17/2021 4:54:43 PM By: Alycia Rossetti Entered By: Alycia Rossetti on 07/17/2021 16:12:48 Jason [name] ([id number]) -------------------------------------------------------------------------------- Wound Assessment Details Patient Name: Jason [name] Date of Service: 07/17/2021 3:00 PM Medical Record Number: [id number] Patient Account Number: [id number] Date of Birth/Sex: Jan 20, 1956 (65 y.o. M) Treating RN: Alycia Rossetti Primary Care Ezmeralda Stefanick: Viviana Simpler Other Clinician: Referring D'Arcy Abraha: Viviana Simpler Treating Jahaan Vanwagner/Extender: Yaakov Guthrie in Treatment: 1 Wound Status Wound Number: 1 Primary Etiology: Pressure Ulcer Wound Location: Left Elbow Wound Status: Open Wounding Event: Pressure Injury Comorbid History: Hypertension Date Acquired: 06/24/2021 Weeks Of Treatment: 1 Clustered Wound: No Photos Wound Measurements Length: (cm) 0.5 % Red Width: (cm) 0.5 % Red Depth: (cm) 0.2 Epith Area: (cm) 0.196 Unde Volume: (cm) 0.039 S En Ma uction in Area: 28.7% uction in Volume: 52.4% elialization: None rmining: Yes tarting Position (o'clock): 12 ding Position (o'clock): 12 ximum Distance: (cm) 1 Wound Description Classification: Category/Stage III Foul Exudate Amount: Medium Sloug Exudate Type: Serosanguineous Exudate Color: red, brown Odor After Cleansing: No h/Fibrino Yes Wound Bed Granulation Amount: Small (1-33%) Exposed Structure Granulation Quality: Pink Fascia Exposed: No Necrotic Amount: Large (67-100%) Fat Layer (Subcutaneous Tissue) Exposed:  Yes Necrotic Quality: Adherent Slough Tendon Exposed: No Muscle Exposed: No Joint Exposed: No Bone Exposed: No Treatment Notes Wound #1 (Elbow) Wound Laterality: Left Cleanser [place] Anc[employer] Kit - [address] Supply Jason [name], Jason [name] ([id number]) Discharge Instruction: Use supplies as instructed; Kit contains: (15) Saline Bullets; (15) 3x3 Gauze; 15 pr Gloves Peri-Wound  Care Topical Primary Dressing [place] compound Discharge Instruction: apply to wound bed Secondary Dressing (SILCONE BORDER) [place] Dressing 5x5 (in/in) Discharge Instruction: Please do not put silicone bordered dressings under wraps. Use non-bordered dressing only. Secured With Compression Wrap Compression Stockings Add-Ons Electronic Signature(s) Signed: 07/17/2021 4:54:43 PM By: Alycia Rossetti Entered By: Alycia Rossetti on 07/17/2021 15:54:37 Jason [name] ([id number]) -------------------------------------------------------------------------------- Vitals Details Patient Name: Jason [name] Date of Service: 07/17/2021 3:00 PM Medical Record Number: [id number] Patient Account Number: [id number] Date of Birth/Sex: 1955/12/01 (65 y.o. M) Treating RN: Alycia Rossetti Primary Care Tarin Navarez: Viviana Simpler Other Clinician: Referring Amiyrah Lamere: Viviana Simpler Treating Elizabth Palka/Extender: Yaakov Guthrie in Treatment: 1 Vital Signs Time Taken: 13:09 Temperature (F): 97.9 Height (in): 69 Pulse (bpm): 86 Weight (lbs): 189 Respiratory Rate (breaths/min): 16 Body Mass Index (BMI): 27.9 Blood Pressure (mmHg): 147/86 Reference Range: 80 - 120 mg / dl Electronic Signature(s) Signed: 07/17/2021 4:54:43 PM By: Alycia Rossetti Entered By: Alycia Rossetti on 07/17/2021 15:10:16

## 2021-07-17 NOTE — Progress Notes (Signed)
ARTUR, WINNINGHAM ([id number]) Visit Report for 07/17/2021 Chief Complaint Document Details Patient Name: Jason [name], Jason [name]. Date of Service: 07/17/2021 3:00 PM Medical Record Number: [id number] Patient Account Number: [id number] Date of Birth/Sex: [birth year] (65 y.o. M) Treating RN: Cornell Barman Primary Care Provider: Viviana Simpler Other Clinician: Referring Provider: Viviana Simpler Treating Provider/Extender: Yaakov Guthrie in Treatment: 1 Information Obtained from: Patient Chief Complaint 07/10/2021; left elbow ulcer Electronic Signature(s) Signed: 07/17/2021 4:18:29 PM By: Kalman Shan DO Entered By: Kalman Shan on 07/17/2021 16:14:33 Jason [name] ([id number]) -------------------------------------------------------------------------------- HPI Details Patient Name: Jason [name] Date of Service: 07/17/2021 3:00 PM Medical Record Number: [id number] Patient Account Number: [id number] Date of Birth/Sex: March 10, 1955 (65 y.o. M) Treating RN: Cornell Barman Primary Care Provider: Viviana Simpler Other Clinician: Referring Provider: Viviana Simpler Treating Provider/Extender: Yaakov Guthrie in Treatment: 1 History of Present Illness HPI Description: Admission 07/10/2021 Mr. Raywood Wailes is a 66 year old male with a past medical history of left rotator cuff status post shoulder surgery 04/18/2021, hypertension that presents to the clinic for a 6-week history of nonhealing ulcer to the left elbow. He has been wearing a sling for the past 2 to 3 months and this created a pressure injury to his left elbow that eventually opened. He has been using Betadine to the area and keeping it covered. He has cut his sling to relieve some of the pressure. He does sleep in a recliner putting pressure on the armrest to his elbow. He currently denies signs of infection. 5/24; patient presents for follow-up. He obtained Keystone antibiotics today. He has been using mupirocin  ointment with collagen daily to the wound bed. He has no issues or complaints today. He denies signs of infection. Electronic Signature(s) Signed: 07/17/2021 4:18:29 PM By: Kalman Shan DO Entered By: Kalman Shan on 07/17/2021 16:15:04 Jason [name] ([id number]) -------------------------------------------------------------------------------- Physical Exam Details Patient Name: Jason [name] Date of Service: 07/17/2021 3:00 PM Medical Record Number: [id number] Patient Account Number: [id number] Date of Birth/Sex: 01-30-1956 (65 y.o. M) Treating RN: Cornell Barman Primary Care Provider: Viviana Simpler Other Clinician: Referring Provider: Viviana Simpler Treating Provider/Extender: Yaakov Guthrie in Treatment: 1 Constitutional . Psychiatric . Notes Left elbow: Open wound with granulation tissue and scant nonviable tissue. No surrounding signs of infection. Circumferential undermining. And tunneling at the 12 o'clock position. No drainage noted. Electronic Signature(s) Signed: 07/17/2021 4:18:29 PM By: Kalman Shan DO Entered By: Kalman Shan on 07/17/2021 16:15:35 Jason [name] ([id number]) -------------------------------------------------------------------------------- Physician Orders Details Patient Name: Jason [name] Date of Service: 07/17/2021 3:00 PM Medical Record Number: [id number] Patient Account Number: [id number] Date of Birth/Sex: 06/13/1955 (65 y.o. M) Treating RN: Alycia Rossetti Primary Care Provider: Viviana Simpler Other Clinician: Referring Provider: Viviana Simpler Treating Provider/Extender: Yaakov Guthrie in Treatment: 1 Verbal / Phone Orders: No Diagnosis Coding Follow-up Appointments o Return Appointment in 1 week. Bathing/ Shower/ Hygiene o May shower; gently cleanse wound with antibacterial soap, rinse and pat dry prior to dressing wounds Wound Treatment Wound #1 - Elbow Wound Laterality: Left Cleanser:  Byram Ancillary Kit - 15 Day Supply (Generic) 1 x Per Day/30 Days Discharge Instructions: Use supplies as instructed; Kit contains: (15) Saline Bullets; (15) 3x3 Gauze; 15 pr Gloves Primary Dressing: Keystone compound 1 x Per Day/30 Days Discharge Instructions: apply to wound bed Secondary Dressing: (SILCONE BORDER) Zetuvit Plus SILICONE BORDER Dressing 5x5 (in/in) (Generic) 1 x Per Day/30 Days Discharge Instructions: Please do not put silicone bordered dressings under wraps. Use non-bordered dressing only. Electronic  Signature(s) Signed: 07/17/2021 4:18:29 PM By: Hoffman, Jessica DO Previous Signature: 07/17/2021 4:09:49 PM Version By: Crisp, Clova Entered By: Hoffman, Jessica on 07/17/2021 16:17:58 Lawson, Takeem E. ([id number]) -------------------------------------------------------------------------------- Problem List Details Patient Name: [name], [name] Date of Service: 07/17/2021 3:00 PM Medical Record Number: [id number] Patient Account Number: [id number] Date of Birth/Sex: 08/17/1955 (65 y.o. 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 ([id number]) -------------------------------------------------------------------------------- Progress Note Details Patient Name: [name], [name] Date of Service: 07/17/2021 3:00 PM Medical Record Number:  [id number] Patient Account Number: [id number] Date of Birth/Sex: 04/18/1955 (65 y.o. M) Treating RN: Woody, Kim Primary Care Provider: Letvak, Richard Other Clinician: Referring Provider: Letvak, Richard Treating Provider/Extender: Hoffman, Jessica Weeks in Treatment: 1 Subjective Chief Complaint Information obtained from Patient 07/10/2021; left elbow ulcer History of Present Illness (HPI) Admission 07/10/2021 Mr. Apostolos Lichty is a 65-year-old male with a past medical history of left rotator cuff status post shoulder surgery 04/18/2021, hypertension that presents to the clinic for a 6-week history of nonhealing ulcer to the left elbow. He has been wearing a sling for the past 2 to 3 months and this created a pressure injury to his left elbow that eventually opened. He has been using Betadine to the area and keeping it covered. He has cut his sling to relieve some of the pressure. He does sleep in a recliner putting pressure on the armrest to his elbow. He currently denies signs of infection. 5/24; patient presents for follow-up. He obtained Keystone antibiotics today. He has been using mupirocin ointment with collagen daily to the wound bed. He has no issues or complaints today. He denies signs of infection. Objective Constitutional Vitals Time Taken: 1:09 PM, Height: 69 in, Weight: 189 lbs, BMI: 27.9, Temperature: 97.9 °F, Pulse: 86 bpm, Respiratory Rate: 16 breaths/min, Blood Pressure: 147/86 mmHg. General Notes: Left elbow: Open wound with granulation tissue and scant nonviable tissue. No surrounding signs of infection. Circumferential undermining. And tunneling at the 12 o'clock position. No drainage noted. Integumentary (Hair, Skin) Wound #1 status is Open. Original cause of wound was Pressure Injury. The date acquired was: 06/24/2021. The wound has been in treatment 1 weeks. The wound is located on the Left Elbow. The wound measures 0.5cm length x 0.5cm width x 0.2cm depth; 0.196cm^2  area and 0.039cm^3 volume. There is Fat Layer (Subcutaneous Tissue) exposed. There is undermining starting at 12:00 and ending at 12:00 with a maximum distance of 1cm. There is a medium amount of serosanguineous drainage noted. There is small (1-33%) pink granulation within the wound bed. There is a large (67-100%) amount of necrotic tissue within the wound bed including Adherent Slough. Assessment Active Problems ICD-10 Pressure ulcer of unspecified elbow, stage 3 Unspecified rotator cuff tear or rupture of left shoulder, not specified as traumatic Essential (primary) hypertension Patient's wound is stable. There has been some improvement to the tunneling area. He received Keystone antibiotics today. We ordered for broad spectrum coverage. I recommended doing this daily. I also recommended aggressive offloading to the wound   bed. He is using a modified sling to help with this. Follow-up in 1 week. Merrihew, Cj E. ([id number]) Plan Follow-up Appointments: Return Appointment in 1 week. Bathing/ Shower/ Hygiene: May shower; gently cleanse wound with antibacterial soap, rinse and pat dry prior to dressing wounds WOUND #1: - Elbow Wound Laterality: Left Cleanser: Byram Ancillary Kit - 15 Day Supply (Generic) 1 x Per Day/30 Days Discharge Instructions: Use supplies as instructed; Kit contains: (15) Saline Bullets; (15) 3x3 Gauze; 15 pr Gloves Primary Dressing: Keystone compound 1 x Per Day/30 Days Discharge Instructions: apply to wound bed Secondary Dressing: (SILCONE BORDER) Zetuvit Plus SILICONE BORDER Dressing 5x5 (in/in) (Generic) 1 x Per Day/30 Days Discharge Instructions: Please do not put silicone bordered dressings under wraps. Use non-bordered dressing only. 1. Keystone antibiotics daily 2. Aggressive offloading modified sling 3. Follow-up in 1 week Electronic Signature(s) Signed: 07/17/2021 4:18:29 PM By: Hoffman, Jessica DO Entered By: Hoffman, Jessica on 07/17/2021  16:17:31 Kreuser, Eastin E. ([id number]) -------------------------------------------------------------------------------- SuperBill Details Patient Name: Spielmann, Duong E. Date of Service: 07/17/2021 Medical Record Number: [id number] Patient Account Number: [id number] Date of Birth/Sex: 04/04/1955 (65 y.o. 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, stage 3 M75.102 Unspecified rotator cuff tear or rupture of left shoulder, not specified as traumatic I10 Essential (primary) hypertension Facility Procedures CPT4 Code: [id number] Description: 99213 - WOUND CARE VISIT-LEV 3 EST PT Modifier: Quantity: 1 Physician Procedures CPT4 Code: 6770416 Description: 99213 - WC PHYS LEVEL 3 - EST PT Modifier: Quantity: 1 CPT4 Code: Description: ICD-10 Diagnosis Description 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 

## 2021-07-19 ENCOUNTER — Other Ambulatory Visit: Payer: Self-pay | Admitting: Internal Medicine

## 2021-07-19 DIAGNOSIS — M25512 Pain in left shoulder: Secondary | ICD-10-CM | POA: Diagnosis not present

## 2021-07-19 DIAGNOSIS — M25612 Stiffness of left shoulder, not elsewhere classified: Secondary | ICD-10-CM | POA: Diagnosis not present

## 2021-07-24 ENCOUNTER — Encounter (HOSPITAL_BASED_OUTPATIENT_CLINIC_OR_DEPARTMENT_OTHER): Payer: BC Managed Care – PPO | Admitting: Internal Medicine

## 2021-07-24 DIAGNOSIS — L89003 Pressure ulcer of unspecified elbow, stage 3: Secondary | ICD-10-CM | POA: Diagnosis not present

## 2021-07-24 DIAGNOSIS — I10 Essential (primary) hypertension: Secondary | ICD-10-CM

## 2021-07-24 DIAGNOSIS — M75102 Unspecified rotator cuff tear or rupture of left shoulder, not specified as traumatic: Secondary | ICD-10-CM

## 2021-07-24 NOTE — Progress Notes (Signed)
JAG, LENZ ([id number]) Visit Report for 07/24/2021 Chief Complaint Document Details Patient Name: Jason [name], Jason [name]. Date of Service: 07/24/2021 2:00 PM Medical Record Number: [id number] Patient Account Number: [id number] Date of Birth/Sex: 1955/11/22 (66 y.o. M) Treating RN: Carlene Coria Primary Care Provider: Viviana Simpler Other Clinician: Referring Provider: Viviana Simpler Treating Provider/Extender: Yaakov Guthrie in Treatment: 2 Information Obtained from: Patient Chief Complaint 07/10/2021; left elbow ulcer Electronic Signature(s) Signed: 07/24/2021 2:29:32 PM By: Kalman Shan DO Entered By: Kalman Shan on 07/24/2021 14:26:12 Jason [name] ([id number]) -------------------------------------------------------------------------------- HPI Details Patient Name: Jason [name] Date of Service: 07/24/2021 2:00 PM Medical Record Number: [id number] Patient Account Number: [id number] Date of Birth/Sex: June 04, 1955 (66 y.o. M) Treating RN: Carlene Coria Primary Care Provider: Viviana Simpler Other Clinician: Referring Provider: Viviana Simpler Treating Provider/Extender: Yaakov Guthrie in Treatment: 2 History of Present Illness HPI Description: Admission 07/10/2021 Jason [name] is a 66 year old male with a past medical history of left rotator cuff status post shoulder surgery 04/18/2021, hypertension that presents to the clinic for a 6-week history of nonhealing ulcer to the left elbow. He has been wearing a sling for the past 2 to 3 months and this created a pressure injury to his left elbow that eventually opened. He has been using Betadine to the area and keeping it covered. He has cut his sling to relieve some of the pressure. He does sleep in a recliner putting pressure on the armrest to his elbow. He currently denies signs of infection. 5/24; patient presents for follow-up. He obtained Keystone antibiotics today. He has been using mupirocin  ointment with collagen daily to the wound bed. He has no issues or complaints today. He denies signs of infection. 5/31; patient presents for follow-up. He has been using Keystone antibiotics daily. He has no issues or complaints today. He denies signs of infection. He has noticed improvement in wound healing. Electronic Signature(s) Signed: 07/24/2021 2:29:32 PM By: Kalman Shan DO Entered By: Kalman Shan on 07/24/2021 14:26:33 Jason [name] ([id number]) -------------------------------------------------------------------------------- Physical Exam Details Patient Name: Jason [name] Date of Service: 07/24/2021 2:00 PM Medical Record Number: [id number] Patient Account Number: [id number] Date of Birth/Sex: 1955/09/22 (66 y.o. M) Treating RN: Carlene Coria Primary Care Provider: Viviana Simpler Other Clinician: Referring Provider: Viviana Simpler Treating Provider/Extender: Yaakov Guthrie in Treatment: 2 Constitutional . Psychiatric . Notes Left elbow: Open wound with granulation tissue at the opening. There is still slight circumferential undermining and tunneling at the 12 o'clock position. No signs of surrounding infection. Electronic Signature(s) Signed: 07/24/2021 2:29:32 PM By: Kalman Shan DO Entered By: Kalman Shan on 07/24/2021 14:27:03 Jason [name] ([id number]) -------------------------------------------------------------------------------- Physician Orders Details Patient Name: Jason [name] Date of Service: 07/24/2021 2:00 PM Medical Record Number: [id number] Patient Account Number: [id number] Date of Birth/Sex: 1955-08-19 (66 y.o. M) Treating RN: Carlene Coria Primary Care Provider: Viviana Simpler Other Clinician: Referring Provider: Viviana Simpler Treating Provider/Extender: Yaakov Guthrie in Treatment: 2 Verbal / Phone Orders: No Diagnosis Coding Follow-up Appointments o Return Appointment in 1 week. Bathing/  Shower/ Hygiene o May shower; gently cleanse wound with antibacterial soap, rinse and pat dry prior to dressing wounds Wound Treatment Wound #1 - Elbow Wound Laterality: Left Cleanser: Byram Ancillary Kit - 15 Day Supply (Generic) 1 x Per Day/30 Days Discharge Instructions: Use supplies as instructed; Kit contains: (15) Saline Bullets; (15) 3x3 Gauze; 15 pr Gloves Primary Dressing: Keystone compound 1 x Per Day/30 Days Discharge Instructions: apply to wound bed Secondary Dressing: (  SILCONE BORDER) Zetuvit Plus SILICONE BORDER Dressing 5x5 (in/in) (Generic) 1 x Per Day/30 Days Discharge Instructions: Please do not put silicone bordered dressings under wraps. Use non-bordered dressing only. Electronic Signature(s) Signed: 07/24/2021 2:29:32 PM By: Kalman Shan DO Entered By: Kalman Shan on 07/24/2021 14:29:14 Jason [name] ([id number]) -------------------------------------------------------------------------------- Problem List Details Patient Name: Jason [name] Date of Service: 07/24/2021 2:00 PM Medical Record Number: [id number] Patient Account Number: [id number] Date of Birth/Sex: [birth year] (66 y.o. 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 Date of Service: 07/24/2021 2:00 PM Medical Record Number: [id number] Patient Account Number: [id number] Date of Birth/Sex: May 11, 1955 (66 y.o. M) Treating RN: Carlene Coria Primary Care Provider: Viviana Simpler Other Clinician: Referring Provider: Viviana Simpler Treating Provider/Extender: Yaakov Guthrie in Treatment: 2 Subjective Chief Complaint Information obtained from Patient 07/10/2021; left elbow ulcer History of Present Illness (HPI) Admission 07/10/2021 Mr. Jonnatan Hanners is a 66 year old male with a past medical history of left rotator cuff status post shoulder surgery 04/18/2021, hypertension that presents to the clinic for a 6-week history of nonhealing ulcer to the left elbow. He has been wearing a sling for the past 2 to 3 months and this created a pressure injury to his left elbow that eventually opened. He has been using Betadine to the area and keeping it covered. He has cut his sling to relieve some of the pressure. He does sleep in a recliner putting pressure on the armrest to his elbow. He currently denies signs of infection. 5/24; patient presents for follow-up. He obtained Keystone antibiotics today. He has been using mupirocin ointment with collagen daily to the wound bed. He has no issues or complaints today. He denies signs of infection. 5/31; patient presents for follow-up. He has been using Keystone antibiotics daily. He has no issues or complaints today. He denies signs of infection. He has noticed improvement in wound healing. Objective Constitutional Vitals Time Taken: 2:06 PM, Height: 69 in, Weight: 189 lbs, BMI: 27.9, Temperature: 96.3 F, Pulse: 85 bpm, Respiratory Rate: 18 breaths/min, Blood Pressure: 139/83 mmHg. General Notes: Left elbow: Open wound with granulation tissue at the opening. There is  still slight circumferential undermining and tunneling at the 12 o'clock position. No signs of surrounding infection. Integumentary (Hair, Skin) Wound #1 status is Open. Original cause of wound was Pressure Injury. The date acquired was: 06/24/2021. The wound has been in treatment 2 weeks. The wound is located on the Left Elbow. The wound measures 0.4cm length x 0.4cm width x 0.2cm depth; 0.126cm^2 area and 0.025cm^3 volume. There is Fat Layer (Subcutaneous Tissue) exposed. There is no tunneling noted, however, there is undermining starting at 12:00 and ending at 12:00 with a maximum distance of 1cm. There is a medium amount of serosanguineous drainage noted. There is small (1-33%) pink granulation within the wound bed. There is a large (67-100%) amount of necrotic tissue within the wound bed including Adherent Slough. Assessment Active Problems ICD-10 Pressure  ulcer of unspecified elbow, stage 3 Unspecified rotator cuff tear or rupture of left shoulder, not specified as traumatic Essential (primary) hypertension Rawe, Niklas E. ([id number]) Patient's wound has shown improvement in size and appearance since last clinic visit. There is healthy granulation tissue at the opening. There appears to be more granulation tissue present as well. I recommended continuing the course with [facility] antibiotic daily. Also recommended aggressive offloading and he has a sling that helps with this. Plan Follow-up Appointments: Return Appointment in 1 week. Bathing/ Shower/ Hygiene: May shower; gently cleanse wound with antibacterial soap, rinse and pat dry prior to dressing wounds WOUND #1: - Elbow Wound Laterality: Left Cleanser: Byram Ancillary Kit - 15 Day Supply (Generic) 1 x Per Day/30 Days Discharge Instructions: Use supplies as instructed; Kit contains: (15) Saline Bullets; (15) 3x3 Gauze; 15 pr Gloves Primary Dressing: Keystone compound 1 x Per Day/30 Days Discharge Instructions: apply to wound  bed Secondary Dressing: (SILCONE BORDER) Zetuvit Plus SILICONE BORDER Dressing 5x5 (in/in) (Generic) 1 x Per Day/30 Days Discharge Instructions: Please do not put silicone bordered dressings under wraps. Use non-bordered dressing only. 1. Keystone antibiotic daily 2. Offloading sling 3. Follow-up in 1 week Electronic Signature(s) Signed: 07/24/2021 2:29:32 PM By: Kalman Shan DO Entered By: Kalman Shan on 07/24/2021 14:28:54 Jason [name] ([id number]) -------------------------------------------------------------------------------- SuperBill Details Patient Name: Jason [name]. Date of Service: 07/24/2021 Medical Record Number: [id number] Patient Account Number: [id number] Date of Birth/Sex: 14-May-1955 (66 y.o. M) Treating RN: Carlene Coria Primary Care Provider: Viviana Simpler Other Clinician: Referring Provider: Viviana Simpler Treating Provider/Extender: Yaakov Guthrie in Treatment: 2 Diagnosis Coding ICD-10 Codes Code Description L89.003 Pressure ulcer of unspecified elbow, stage 3 M75.102 Unspecified rotator cuff tear or rupture of left shoulder, not specified as traumatic I10 Essential (primary) hypertension Facility Procedures CPT4 Code: [id number] Description: 99213 - WOUND CARE VISIT-LEV 3 EST PT Modifier: Quantity: 1 Physician Procedures CPT4 Code: 4628638 Description: 17711 - WC PHYS LEVEL 3 - EST PT Modifier: Quantity: 1 CPT4 Code: Description: ICD-10 Diagnosis Description L89.003 Pressure ulcer of unspecified elbow, stage 3 M75.102 Unspecified rotator cuff tear or rupture of left shoulder, not specified I10 Essential (primary) hypertension Modifier: as traumatic Quantity: Electronic Signature(s) Signed: 07/24/2021 2:33:49 PM By: Carlene Coria RN Signed: 07/24/2021 2:48:35 PM By: Kalman Shan DO Previous Signature: 07/24/2021 2:29:32 PM Version By: Kalman Shan DO Entered By: Carlene Coria on 07/24/2021 14:33:48

## 2021-07-24 NOTE — Progress Notes (Signed)
COBY, SHREWSBERRY ([id number]) Visit Report for 07/24/2021 Arrival Information Details Patient Name: Jason [name], Jason [name]. Date of Service: 07/24/2021 2:00 PM Medical Record Number: [id number] Patient Account Number: [id number] Date of Birth/Sex: [birth year] (65 y.o. M) Treating RN: Carlene Coria Primary Care Kabeer Hoagland: Viviana Simpler Other Clinician: Referring Saraiyah Hemminger: Viviana Simpler Treating Regine Christian/Extender: Yaakov Guthrie in Treatment: 2 Visit Information History Since Last Visit All ordered tests and consults were completed: No Patient Arrived: Ambulatory Added or deleted any medications: No Arrival Time: 14:01 Any new allergies or adverse reactions: No Accompanied By: wife Had a fall or experienced change in No Transfer Assistance: None activities of daily living that may affect Patient Identification Verified: Yes risk of falls: Secondary Verification Process Completed: Yes Signs or symptoms of abuse/neglect since last visito No Patient Requires Transmission-Based Precautions: No Hospitalized since last visit: No Patient Has Alerts: No Implantable device outside of the clinic excluding No cellular tissue based products placed in the center since last visit: Has Dressing in Place as Prescribed: Yes Pain Present Now: No Electronic Signature(s) Signed: 07/24/2021 5:04:43 PM By: Carlene Coria RN Entered By: Carlene Coria on 07/24/2021 14:06:20 Jason [name] ([id number]) -------------------------------------------------------------------------------- Clinic Level of Care Assessment Details Patient Name: Jason [name] Date of Service: 07/24/2021 2:00 PM Medical Record Number: [id number] Patient Account Number: [id number] Date of Birth/Sex: 12-Feb-1956 (65 y.o. M) Treating RN: Carlene Coria Primary Care Sheritta Deeg: Viviana Simpler Other Clinician: Referring Arlet Marter: Viviana Simpler Treating Weslie Rasmus/Extender: Yaakov Guthrie in Treatment: 2 Clinic Level of  Care Assessment Items TOOL 4 Quantity Score X - Use when only an EandM is performed on FOLLOW-UP visit 1 0 ASSESSMENTS - Nursing Assessment / Reassessment X - Reassessment of Co-morbidities (includes updates in patient status) 1 10 X- 1 5 Reassessment of Adherence to Treatment Plan ASSESSMENTS - Wound and Skin Assessment / Reassessment X - Simple Wound Assessment / Reassessment - one wound 1 5 '[]'  - 0 Complex Wound Assessment / Reassessment - multiple wounds '[]'  - 0 Dermatologic / Skin Assessment (not related to wound area) ASSESSMENTS - Focused Assessment '[]'  - Circumferential Edema Measurements - multi extremities 0 '[]'  - 0 Nutritional Assessment / Counseling / Intervention '[]'  - 0 Lower Extremity Assessment (monofilament, tuning fork, pulses) '[]'  - 0 Peripheral Arterial Disease Assessment (using hand held doppler) ASSESSMENTS - Ostomy and/or Continence Assessment and Care '[]'  - Incontinence Assessment and Management 0 '[]'  - 0 Ostomy Care Assessment and Management (repouching, etc.) PROCESS - Coordination of Care X - Simple Patient / Family Education for ongoing care 1 15 '[]'  - 0 Complex (extensive) Patient / Family Education for ongoing care '[]'  - 0 Staff obtains Programmer, systems, Records, Test Results / Process Orders '[]'  - 0 Staff telephones HHA, Nursing Homes / Clarify orders / etc '[]'  - 0 Routine Transfer to another Facility (non-emergent condition) '[]'  - 0 Routine Hospital Admission (non-emergent condition) '[]'  - 0 New Admissions / Biomedical engineer / Ordering NPWT, Apligraf, etc. '[]'  - 0 Emergency Hospital Admission (emergent condition) X- 1 10 Simple Discharge Coordination '[]'  - 0 Complex (extensive) Discharge Coordination PROCESS - Special Needs '[]'  - Pediatric / Minor Patient Management 0 '[]'  - 0 Isolation Patient Management '[]'  - 0 Hearing / Language / Visual special needs '[]'  - 0 Assessment of Community assistance (transportation, D/C planning, etc.) '[]'  - 0 Additional  assistance / Altered mentation '[]'  - 0 Support Surface(s) Assessment (bed, cushion, seat, etc.) INTERVENTIONS - Wound Cleansing / Measurement Winnie, Prakash E. ([id number]) X- 1 5 Simple Wound Cleansing -  one wound '[]'  - 0 Complex Wound Cleansing - multiple wounds X- 1 5 Wound Imaging (photographs - any number of wounds) '[]'  - 0 Wound Tracing (instead of photographs) X- 1 5 Simple Wound Measurement - one wound '[]'  - 0 Complex Wound Measurement - multiple wounds INTERVENTIONS - Wound Dressings X - Small Wound Dressing one or multiple wounds 1 10 '[]'  - 0 Medium Wound Dressing one or multiple wounds '[]'  - 0 Large Wound Dressing one or multiple wounds X- 1 5 Application of Medications - topical '[]'  - 0 Application of Medications - injection INTERVENTIONS - Miscellaneous '[]'  - External ear exam 0 '[]'  - 0 Specimen Collection (cultures, biopsies, blood, body fluids, etc.) '[]'  - 0 Specimen(s) / Culture(s) sent or taken to Lab for analysis '[]'  - 0 Patient Transfer (multiple staff / Civil Service fast streamer / Similar devices) '[]'  - 0 Simple Staple / Suture removal (25 or less) '[]'  - 0 Complex Staple / Suture removal (26 or more) '[]'  - 0 Hypo / Hyperglycemic Management (close monitor of Blood Glucose) '[]'  - 0 Ankle / Brachial Index (ABI) - do not check if billed separately X- 1 5 Vital Signs Has the patient been seen at the hospital within the last three years: Yes Total Score: 80 Level Of Care: New/Established - Level 3 Electronic Signature(s) Signed: 07/24/2021 5:04:43 PM By: Carlene Coria RN Entered By: Carlene Coria on 07/24/2021 14:33:39 Jason [name] ([id number]) -------------------------------------------------------------------------------- Encounter Discharge Information Details Patient Name: Jason [name]. Date of Service: 07/24/2021 2:00 PM Medical Record Number: [id number] Patient Account Number: [id number] Date of Birth/Sex: [birth year] (65 y.o. M) Treating RN: Carlene Coria Primary  Care Catherin Doorn: Viviana Simpler Other Clinician: Referring Yeraldin Litzenberger: Viviana Simpler Treating Stepanie Graver/Extender: Yaakov Guthrie in Treatment: 2 Encounter Discharge Information Items Discharge Condition: Stable Ambulatory Status: Ambulatory Discharge Destination: Home Transportation: Private Auto Accompanied By: wife Schedule Follow-up Appointment: Yes Clinical Summary of Care: Patient Declined Electronic Signature(s) Signed: 07/24/2021 2:35:33 PM By: Carlene Coria RN Entered By: Carlene Coria on 07/24/2021 14:35:33 Jason [name] ([id number]) -------------------------------------------------------------------------------- Lower Extremity Assessment Details Patient Name: Jason [name]. Date of Service: 07/24/2021 2:00 PM Medical Record Number: [id number] Patient Account Number: [id number] Date of Birth/Sex: 05/03/1955 (65 y.o. M) Treating RN: Carlene Coria Primary Care Gaylin Osoria: Viviana Simpler Other Clinician: Referring Aundra Pung: Viviana Simpler Treating Shadara Lopez/Extender: Yaakov Guthrie in Treatment: 2 Electronic Signature(s) Signed: 07/24/2021 5:04:43 PM By: Carlene Coria RN Entered By: Carlene Coria on 07/24/2021 14:15:22 Jason [name] ([id number]) -------------------------------------------------------------------------------- Multi Wound Chart Details Patient Name: Jason [name]. Date of Service: 07/24/2021 2:00 PM Medical Record Number: [id number] Patient Account Number: [id number] Date of Birth/Sex: [birth year] (65 y.o. M) Treating RN: Carlene Coria Primary Care Christne Platts: Viviana Simpler Other Clinician: Referring Paddy Walthall: Viviana Simpler Treating Talyn Eddie/Extender: Yaakov Guthrie in Treatment: 2 Vital Signs Height(in): 69 Pulse(bpm): 85 Weight(lbs): 189 Blood Pressure(mmHg): 139/83 Body Mass Index(BMI): 27.9 Temperature(F): 96.3 Respiratory Rate(breaths/min): 18 Photos: [N/A:N/A] Wound Location: Left Elbow N/A N/A Wounding Event:  Pressure Injury N/A N/A Primary Etiology: Pressure Ulcer N/A N/A Comorbid History: Hypertension N/A N/A Date Acquired: 06/24/2021 N/A N/A Weeks of Treatment: 2 N/A N/A Wound Status: Open N/A N/A Wound Recurrence: No N/A N/A Measurements L x W x D (cm) 0.4x0.4x0.2 N/A N/A Area (cm) : 0.126 N/A N/A Volume (cm) : 0.025 N/A N/A % Reduction in Area: 54.20% N/A N/A % Reduction in Volume: 69.50% N/A N/A Starting Position 1 (o'clock): 12 Ending Position 1 (o'clock): 12 Maximum Distance 1 (cm): 1 Undermining: Yes N/A N/A  Classification: Category/Stage III N/A N/A Exudate Amount: Medium N/A N/A Exudate Type: Serosanguineous N/A N/A Exudate Color: red, brown N/A N/A Granulation Amount: Small (1-33%) N/A N/A Granulation Quality: Pink N/A N/A Necrotic Amount: Large (67-100%) N/A N/A Exposed Structures: Fat Layer (Subcutaneous Tissue): N/A N/A Yes Fascia: No Tendon: No Muscle: No Joint: No Bone: No Epithelialization: None N/A N/A Treatment Notes Electronic Signature(s) Signed: 07/24/2021 5:04:43 PM By: Carlene Coria RN Entered By: Carlene Coria on 07/24/2021 14:15:38 Jason [name] ([id number] [name] ([id number]) -------------------------------------------------------------------------------- Multi-Disciplinary Care Plan Details Patient Name: Jason [name]. Date of Service: 07/24/2021 2:00 PM Medical Record Number: [id number] Patient Account Number: [id number] Date of Birth/Sex: Dec 31, 1955 (65 y.o. M) Treating RN: Carlene Coria Primary Care Macel Yearsley: Viviana Simpler Other Clinician: Referring Carson Meche: Viviana Simpler Treating Lurlean Kernen/Extender: Yaakov Guthrie in Treatment: 2 Active Inactive Wound/Skin Impairment Nursing Diagnoses: Knowledge deficit related to ulceration/compromised skin integrity Goals: Patient/caregiver will verbalize understanding of skin care regimen Date Initiated: 07/10/2021 Target Resolution Date: 08/10/2021 Goal Status:  Active Ulcer/skin breakdown will have a volume reduction of 30% by week 4 Date Initiated: 07/10/2021 Target Resolution Date: 08/10/2021 Goal Status: Active Ulcer/skin breakdown will have a volume reduction of 50% by week 8 Date Initiated: 07/10/2021 Target Resolution Date: 09/09/2021 Goal Status: Active Ulcer/skin breakdown will have a volume reduction of 80% by week 12 Date Initiated: 07/10/2021 Target Resolution Date: 10/10/2021 Goal Status: Active Ulcer/skin breakdown will heal within 14 weeks Date Initiated: 07/10/2021 Target Resolution Date: 11/10/2021 Goal Status: Active Interventions: Assess patient/caregiver ability to obtain necessary supplies Assess patient/caregiver ability to perform ulcer/skin care regimen upon admission and as needed Assess ulceration(s) every visit Notes: Electronic Signature(s) Signed: 07/24/2021 5:04:43 PM By: Carlene Coria RN Entered By: Carlene Coria on 07/24/2021 14:15:25 Jason [name] ([id number]) -------------------------------------------------------------------------------- Pain Assessment Details Patient Name: Jason [name]. Date of Service: 07/24/2021 2:00 PM Medical Record Number: [id number] Patient Account Number: [id number] Date of Birth/Sex: Feb 26, 1955 (65 y.o. M) Treating RN: Carlene Coria Primary Care Audie Stayer: Viviana Simpler Other Clinician: Referring Lyfe Monger: Viviana Simpler Treating Jakerra Floyd/Extender: Yaakov Guthrie in Treatment: 2 Active Problems Location of Pain Severity and Description of Pain Patient Has Paino No Site Locations Pain Management and Medication Current Pain Management: Electronic Signature(s) Signed: 07/24/2021 5:04:43 PM By: Carlene Coria RN Entered By: Carlene Coria on 07/24/2021 14:07:00 Jason [name] ([id number]) -------------------------------------------------------------------------------- Patient/Caregiver Education Details Patient Name: Jason [name]. Date of Service: 07/24/2021  2:00 PM Medical Record Number: [id number] Patient Account Number: [id number] Date of Birth/Gender: 21-Sep-1955 (66 y.o. M) Treating RN: Carlene Coria Primary Care Physician: Viviana Simpler Other Clinician: Referring Physician: Viviana Simpler Treating Physician/Extender: Yaakov Guthrie in Treatment: 2 Education Assessment Education Provided To: Patient Education Topics Provided Wound/Skin Impairment: Methods: Explain/Verbal Responses: State content correctly Electronic Signature(s) Signed: 07/24/2021 5:04:43 PM By: Carlene Coria RN Entered By: Carlene Coria on 07/24/2021 14:33:59 Jason [name] ([id number]) -------------------------------------------------------------------------------- Wound Assessment Details Patient Name: Jason [name] Date of Service: 07/24/2021 2:00 PM Medical Record Number: [id number] Patient Account Number: [id number] Date of Birth/Sex: Jul 15, 1955 (65 y.o. M) Treating RN: Carlene Coria Primary Care Cellie Dardis: Viviana Simpler Other Clinician: Referring Woodruff Skirvin: Viviana Simpler Treating Nayah Lukens/Extender: Yaakov Guthrie in Treatment: 2 Wound Status Wound Number: 1 Primary Etiology: Pressure Ulcer Wound Location: Left Elbow Wound Status: Open Wounding Event: Pressure Injury Comorbid History: Hypertension Date Acquired: 06/24/2021 Weeks Of Treatment: 2 Clustered Wound: No Photos Wound Measurements Length: (cm) 0.4 % Red Width: (cm) 0.4 % Red Depth: (cm) 0.2 Epith Area: (cm) 0.126  Tunn Volume: (cm) 0.025 Un[address] St En Ma uction in Area: 54.2% uction in Volume: 69.5% elialization: None eling: No rmining: Yes arting Position (o'clock): 12 ding Position (o'clock): 12 ximum Distance: (cm) 1 Wound Description Classification: Category/Stage III Foul Exudate Amount: Medium Sloug Exudate Type: Serosanguineous Exudate Color: red, brown Odor After Cleansing: No h/Fibrino Yes Wound Bed Granulation Amount: Small (1-33%) Exposed  Structure Granulation Quality: Pink Fascia Exposed: No Necrotic Amount: Large (67-100%) Fat Layer (Subcutaneous Tissue) Exposed: Yes Necrotic Quality: Adherent Slough Tendon Exposed: No Muscle Exposed: No Joint Exposed: No Bone Exposed: No Treatment Notes Wound #1 (Elbow) Wound Laterality: Left Cleanser [place] Ancillary Kit - [address] [employer] LUKAH, GOSWAMI ([id number]) Discharge Instruction: Use supplies as instructed; Kit contains: (15) Saline Bullets; (15) 3x3 Gauze; 15 pr Gloves Peri-Wound Care Topical Primary Dressing Keystone compound Discharge Instruction: apply to wound bed Secondary Dressing (SILCONE BORDER) Zetuvit Plus SILICONE BORDER Dressing 5x5 (in/in) Discharge Instruction: Please do not put silicone bordered dressings under wraps. Use non-bordered dressing only. Secured With Compression Wrap Compression Stockings Environmental education officer) Signed: 07/24/2021 5:04:43 PM By: Carlene Coria RN Entered By: Carlene Coria on 07/24/2021 14:14:33 Jason [name] ([id number]) -------------------------------------------------------------------------------- Vitals Details Patient Name: Jason [name] Date of Service: 07/24/2021 2:00 PM Medical Record Number: [id number] Patient Account Number: [id number] Date of Birth/Sex: [birth year] (65 y.o. M) Treating RN: Carlene Coria Primary Care Ai Sonnenfeld: Viviana Simpler Other Clinician: Referring Renell Allum: Viviana Simpler Treating Twilla Khouri/Extender: Yaakov Guthrie in Treatment: 2 Vital Signs Time Taken: 14:06 Temperature (F): 96.3 Height (in): 69 Pulse (bpm): 85 Weight (lbs): 189 Respiratory Rate (breaths/min): 18 Body Mass Index (BMI): 27.9 Blood Pressure (mmHg): 139/83 Reference Range: 80 - 120 mg / dl Electronic Signature(s) Signed: 07/24/2021 5:04:43 PM By: Carlene Coria RN Entered By: Carlene Coria on 07/24/2021 14:06:52

## 2021-07-29 DIAGNOSIS — M25612 Stiffness of left shoulder, not elsewhere classified: Secondary | ICD-10-CM | POA: Diagnosis not present

## 2021-07-29 DIAGNOSIS — M25512 Pain in left shoulder: Secondary | ICD-10-CM | POA: Diagnosis not present

## 2021-07-31 ENCOUNTER — Encounter: Payer: BC Managed Care – PPO | Attending: Internal Medicine | Admitting: Physician Assistant

## 2021-07-31 DIAGNOSIS — M75102 Unspecified rotator cuff tear or rupture of left shoulder, not specified as traumatic: Secondary | ICD-10-CM | POA: Insufficient documentation

## 2021-07-31 DIAGNOSIS — L89003 Pressure ulcer of unspecified elbow, stage 3: Secondary | ICD-10-CM | POA: Diagnosis not present

## 2021-07-31 DIAGNOSIS — L89023 Pressure ulcer of left elbow, stage 3: Secondary | ICD-10-CM | POA: Diagnosis not present

## 2021-07-31 DIAGNOSIS — I10 Essential (primary) hypertension: Secondary | ICD-10-CM | POA: Insufficient documentation

## 2021-07-31 NOTE — Progress Notes (Addendum)
FAIZAAN, FALLS ([id number]) Visit Report for 07/31/2021 Chief Complaint Document Details Patient Name: Jason [name], Jason [name]. Date of Service: 07/31/2021 2:45 PM Medical Record Number: [id number] Patient Account Number: [id number] Date of Birth/Sex: 03-06-1955 (66 y.o. M) Treating RN: Jason [name] Primary Care Provider: Viviana [name] Other Clinician: Referring Provider: Viviana [name] Treating Provider/Extender: Jason [name] in Treatment: 3 Information Obtained from: Patient Chief Complaint 07/10/2021; left elbow ulcer Electronic Signature(s) Signed: 07/31/2021 3:13:02 PM By: Jason Keeler PA-C Entered By: Jason [name] on 07/31/2021 15:13:02 Jason [name] ([id number]) -------------------------------------------------------------------------------- Debridement Details Patient Name: Jason [name] Date of Service: 07/31/2021 2:45 PM Medical Record Number: [id number] Patient Account Number: [id number] Date of Birth/Sex: February 27, 1955 (66 y.o. M) Treating RN: Jason [name] Primary Care Provider: Viviana [name] Other Clinician: Referring Provider: Viviana [name] Treating Provider/Extender: Jason [name] in Treatment: 3 Debridement Performed for Wound #1 Left Elbow Assessment: Performed By: Physician Jason [name]., PA-C Debridement Type: Debridement Level of Consciousness (Pre- Awake and Alert procedure): Pre-procedure Verification/Time Out Yes - 15:25 Taken: Start Time: 15:25 Total Area Debrided (L x W): 0.2 (cm) x 0.2 (cm) = 0.04 (cm) Tissue and other material Viable, Non-Viable, Subcutaneous debrided: Level: Skin/Subcutaneous Tissue Debridement Description: Excisional Instrument: Curette Bleeding: Minimum Hemostasis Achieved: Pressure End Time: 15:29 Procedural Pain: 0 Post Procedural Pain: 0 Response to Treatment: Procedure was tolerated well Level of Consciousness (Post- Awake and Alert procedure): Post Debridement Measurements of Total  Wound Length: (cm) 0.5 Stage: Category/Stage III Width: (cm) 0.5 Depth: (cm) 0.2 Volume: (cm) 0.039 Character of Wound/Ulcer Post Debridement: Improved Post Procedure Diagnosis Same as Pre-procedure Electronic Signature(s) Signed: 08/01/2021 4:11:43 PM By: Jason Keeler PA-C Signed: 08/02/2021 9:45:44 AM By: Jason Coria RN Entered By: Jason [name] on 07/31/2021 15:30:13 Jason [name] ([id number]) -------------------------------------------------------------------------------- HPI Details Patient Name: Jason [name]. Date of Service: 07/31/2021 2:45 PM Medical Record Number: [id number] Patient Account Number: [id number] Date of Birth/Sex: 14-Mar-1955 (66 y.o. M) Treating RN: Jason [name] Primary Care Provider: Viviana [name] Other Clinician: Referring Provider: Viviana [name] Treating Provider/Extender: Jason [name] in Treatment: 3 History of Present Illness HPI Description: Admission 07/10/2021 [name] is a 66 year old male with a past medical history of left rotator cuff status post shoulder surgery 04/18/2021, hypertension that presents to the clinic for a 6-week history of nonhealing ulcer to the left elbow. He has been wearing a sling for the past 2 to 3 months and this created a pressure injury to his left elbow that eventually opened. He has been using Betadine to the area and keeping it covered. He has cut his sling to relieve some of the pressure. He does sleep in a recliner putting pressure on the armrest to his elbow. He currently denies signs of infection. 5/24; patient presents for follow-up. He obtained Keystone antibiotics today. He has been using mupirocin ointment with collagen daily to the wound bed. He has no issues or complaints today. He denies signs of infection. 5/31; patient presents for follow-up. He has been using Keystone antibiotics daily. He has no issues or complaints today. He denies signs of infection. He has noticed improvement in  wound healing. 08-01-2021 upon evaluation today patient appears to be doing well with regard to his elbow ulcer he does have a small what appears to be calcium deposit in the middle of the wound I think we need to try to clean this out to allow it to heal more effectively other than that though this seems to be doing excellent  in my opinion. I see no signs of active infection locally or systemically. Electronic Signature(s) Signed: 08/01/2021 8:03:29 AM By: Jason Keeler PA-C Entered By: Jason [name] on 08/01/2021 08:03:29 Jason [name] ([id number]) -------------------------------------------------------------------------------- Physical Exam Details Patient Name: Jason [name] Date of Service: 07/31/2021 2:45 PM Medical Record Number: [id number] Patient Account Number: [id number] Date of Birth/Sex: 09-09-1955 (66 y.o. M) Treating RN: Jason [name] Primary Care Provider: Viviana [name] Other Clinician: Referring Provider: Viviana [name] Treating Provider/Extender: Jason [name] in Treatment: 3 Constitutional Well-nourished and well-hydrated in no acute distress. Respiratory normal breathing without difficulty. Psychiatric this patient is able to make decisions and demonstrates good insight into disease process. Alert and Oriented x 3. pleasant and cooperative. Notes Patient's wound again did require sharp debridement to clear away some of the calcium deposit he tolerated that without complication and postdebridement the wound bed appears to be doing much better which is great news. Electronic Signature(s) Signed: 08/01/2021 8:03:41 AM By: Jason Keeler PA-C Entered By: Jason [name] on 08/01/2021 08:03:41 Jason [name] ([id number]) -------------------------------------------------------------------------------- Physician Orders Details Patient Name: Jason [name] Date of Service: 07/31/2021 2:45 PM Medical Record Number: [id number] Patient Account Number:  [id number] Date of Birth/Sex: June 23, 1955 (66 y.o. 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 Bathing/ Shower/ Hygiene o May shower; gently cleanse wound with antibacterial soap, rinse and pat dry prior to dressing wounds Wound Treatment Wound #1 - Elbow Wound Laterality: Left Cleanser: Byram Ancillary Kit - 15 Day Supply (Generic) 1 x Per Day/30 Days Discharge Instructions: Use supplies as instructed; Kit contains: (15) Saline Bullets; (15) 3x3 Gauze; 15 pr Gloves Primary Dressing: Keystone compound 1 x Per Day/30 Days Discharge Instructions: apply to wound bed Secondary Dressing: (SILCONE BORDER) Zetuvit Plus SILICONE BORDER Dressing 5x5 (in/in) (Generic) 1 x Per Day/30 Days Discharge Instructions: Please do not put silicone bordered dressings under wraps. Use non-bordered dressing only. Electronic Signature(s) Signed: 08/01/2021 4:11:43 PM By: Jason Keeler PA-C Signed: 08/02/2021 9:45:44 AM By: Jason Coria RN Entered By: Jason [name] on 07/31/2021 15:24:43 Jason [name] ([id number]) -------------------------------------------------------------------------------- Problem List Details Patient Name: Jason [name] Date of Service: 07/31/2021 2:45 PM Medical Record Number: [id number] Patient Account Number: [id number] Date of Birth/Sex: 1955-10-16 (66 y.o. 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 Date of Service: 07/31/2021 2:45 PM Medical Record Number: [id number] Patient Account Number: [id number] Date of Birth/Sex: 19-Apr-1955 (66 y.o. M) Treating RN: Jason [name] Primary Care Provider: Viviana [name] Other Clinician: Referring Provider: Viviana [name] Treating Provider/Extender: Jason [name] in Treatment: 3 Subjective  Chief Complaint Information obtained from Patient 07/10/2021; left elbow ulcer History of Present Illness (HPI) Admission 07/10/2021 Mr. Rakeem Colley is a 66 year old male with a past medical history of left rotator cuff status post shoulder surgery 04/18/2021, hypertension that presents to the clinic for a 6-week history of nonhealing ulcer to the left elbow. He has been wearing a sling for the past 2 to 3 months and this created a pressure injury to his left elbow that eventually opened. He has been using Betadine to the area and keeping it covered. He has cut his sling to relieve some of the pressure. He does sleep in a recliner putting pressure on the armrest to his elbow. He currently denies signs of infection. 5/24; patient presents for follow-up. He obtained Keystone antibiotics today. He has been using mupirocin ointment with collagen daily to the wound bed. He has no issues or complaints today. He  denies signs of infection. 5/31; patient presents for follow-up. He has been using Keystone antibiotics daily. He has no issues or complaints today. He denies signs of infection. He has noticed improvement in wound healing. 08-01-2021 upon evaluation today patient appears to be doing well with regard to his elbow ulcer he does have a small what appears to be calcium deposit in the middle of the wound I think we need to try to clean this out to allow it to heal more effectively other than that though this seems to be doing excellent in my opinion. I see no signs of active infection locally or systemically. Objective Constitutional Well-nourished and well-hydrated in no acute distress. Vitals Time Taken: 3:08 PM, Height: 69 in, Weight: 189 lbs, BMI: 27.9, Temperature: 98.3 F, Pulse: 69 bpm, Respiratory Rate: 18 breaths/min, Blood Pressure: 142/78 mmHg. Respiratory normal breathing without difficulty. Psychiatric this patient is able to make decisions and demonstrates good insight into disease process. Alert and Oriented x 3. pleasant and cooperative. General Notes: Patient's wound again did require sharp debridement to clear away some of the calcium deposit he tolerated that without complication and postdebridement the wound bed appears to be doing much better which is great news. Integumentary (Hair, Skin) Wound #1 status is Open. Original cause of wound was Pressure Injury. The date acquired was: 06/24/2021. The wound has been in treatment 3 weeks. The wound is located on the Left Elbow. The wound measures 0.5cm length x 0.5cm width x 0.2cm depth; 0.196cm^2 area and 0.039cm^3 volume. There is Fat Layer (Subcutaneous Tissue) exposed. There is no tunneling noted, however, there is undermining starting at 12:00 and ending at 3:00 with a maximum distance of 1.6cm. There is a medium amount of serosanguineous drainage noted. There is medium (34-66%) pink granulation within the wound bed. There is a  small (1-33%) amount of necrotic tissue within the wound bed. Assessment Jason [name], Jason [name] ([id number]) Active Problems ICD-10 Pressure ulcer of unspecified elbow, stage 3 Unspecified rotator cuff tear or rupture of left shoulder, not specified as traumatic Essential (primary) hypertension Procedures Wound #1 Pre-procedure diagnosis of Wound #1 is a Pressure Ulcer located on the Left Elbow . There was a Excisional Skin/Subcutaneous Tissue Debridement with a total area of 0.04 sq cm performed by Jason [name]., PA-C. With the following instrument(s): Curette to remove Viable and Non-Viable tissue/material. Material removed includes Subcutaneous Tissue. No specimens were taken. A time out was conducted at 15:25, prior to the start of the procedure. A Minimum amount of bleeding was controlled with Pressure. The procedure was tolerated well with a pain  level of 0 throughout and a pain level of 0 following the procedure. Post Debridement Measurements: 0.5cm length x 0.5cm width x 0.2cm depth; 0.039cm^3 volume. Post debridement Stage noted as Category/Stage III. Character of Wound/Ulcer Post Debridement is improved. Post procedure Diagnosis Wound #1: Same as Pre-Procedure Plan Follow-up Appointments: Return Appointment in 1 week. Bathing/ Shower/ Hygiene: May shower; gently cleanse wound with antibacterial soap, rinse and pat dry prior to dressing wounds WOUND #1: - Elbow Wound Laterality: Left Cleanser: Byram Ancillary Kit - 15 Day Supply (Generic) 1 x Per Day/30 Days Discharge Instructions: Use supplies as instructed; Kit contains: (15) Saline Bullets; (15) 3x3 Gauze; 15 pr Gloves Primary Dressing: Keystone compound 1 x Per Day/30 Days Discharge Instructions: apply to wound bed Secondary Dressing: (SILCONE BORDER) Zetuvit Plus SILICONE BORDER Dressing 5x5 (in/in) (Generic) 1 x Per Day/30 Days Discharge Instructions: Please do not put silicone bordered dressings under wraps. Use non-bordered  dressing only. [place] suggest that we go ahead and continue with the wound care measures as before and the patient is in agreement with that plan this includes the use of the [facility] topical compound for antibiotics to try to keep the infection under control. 2. Also can recommend that we continue with a bordered foam dressing to cover. 3. I would also suggest patient continue to monitor for any signs of worsening infection if anything changes he should let me know. We will see patient back for reevaluation in 1 week here in the clinic. If anything worsens or changes patient will contact our office for additional recommendations. Electronic Signature(s) Signed: 08/01/2021 8:04:10 AM By: Jason Keeler PA-C Entered By: Jason [name] on 08/01/2021 08:04:10 Jason [name] ([id number]) -------------------------------------------------------------------------------- SuperBill Details Patient Name: Jason [name] Date of Service: 07/31/2021 Medical Record Number: [id number] Patient Account Number: [id number] Date of Birth/Sex: 10/21/1955 (66 y.o. 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

## 2021-08-01 DIAGNOSIS — M25512 Pain in left shoulder: Secondary | ICD-10-CM | POA: Diagnosis not present

## 2021-08-01 DIAGNOSIS — M25612 Stiffness of left shoulder, not elsewhere classified: Secondary | ICD-10-CM | POA: Diagnosis not present

## 2021-08-02 NOTE — Progress Notes (Signed)
MASTER, TOUCHET ([id number]) Visit Report for 07/31/2021 Arrival Information Details Patient Name: Jason, [name]. Date of Service: 07/31/2021 2:45 PM Medical Record Number: [id number] Patient Account Number: [id number] Date of Birth/Sex: 05/15/1955 (66 y.o. M) Treating RN: Carlene Coria Primary Care Chicquita Mendel: Viviana Simpler Other Clinician: Referring Dale Strausser: Viviana Simpler Treating Angline Schweigert/Extender: Skipper Cliche in Treatment: 3 Visit Information History Since Last Visit All ordered tests and consults were completed: No Patient Arrived: Ambulatory Added or deleted any medications: No Arrival Time: 15:07 Any new allergies or adverse reactions: No Accompanied By: wife Had a fall or experienced change in No Transfer Assistance: None activities of daily living that may affect Patient Identification Verified: Yes risk of falls: Secondary Verification Process Completed: Yes Signs or symptoms of abuse/neglect since last visito No Patient Requires Transmission-Based Precautions: No Hospitalized since last visit: No Patient Has Alerts: No Has Dressing in Place as Prescribed: Yes Pain Present Now: No Electronic Signature(s) Signed: 08/02/2021 9:45:44 AM By: Carlene Coria RN Entered By: Carlene Coria on 07/31/2021 15:08:08 Jason [name] ([id number]) -------------------------------------------------------------------------------- Clinic Level of Care Assessment Details Patient Name: Jason [name] Date of Service: 07/31/2021 2:45 PM Medical Record Number: [id number] Patient Account Number: [id number] Date of Birth/Sex: 30-Jun-1955 (66 y.o. M) Treating RN: Carlene Coria Primary Care Argel Pablo: Viviana Simpler Other Clinician: Referring Tawni Melkonian: Viviana Simpler Treating Koleson Reifsteck/Extender: Skipper Cliche in Treatment: 3 Clinic Level of Care Assessment Items TOOL 1 Quantity Score '[]'  - Use when EandM and Procedure is performed on INITIAL visit 0 ASSESSMENTS - Nursing  Assessment / Reassessment '[]'  - General Physical Exam (combine w/ comprehensive assessment (listed just below) when performed on new 0 pt. evals) '[]'  - 0 Comprehensive Assessment (HX, ROS, Risk Assessments, Wounds Hx, etc.) ASSESSMENTS - Wound and Skin Assessment / Reassessment '[]'  - Dermatologic / Skin Assessment (not related to wound area) 0 ASSESSMENTS - Ostomy and/or Continence Assessment and Care '[]'  - Incontinence Assessment and Management 0 '[]'  - 0 Ostomy Care Assessment and Management (repouching, etc.) PROCESS - Coordination of Care '[]'  - Simple Patient / Family Education for ongoing care 0 '[]'  - 0 Complex (extensive) Patient / Family Education for ongoing care '[]'  - 0 Staff obtains Programmer, systems, Records, Test Results / Process Orders '[]'  - 0 Staff telephones HHA, Nursing Homes / Clarify orders / etc '[]'  - 0 Routine Transfer to another Facility (non-emergent condition) '[]'  - 0 Routine Hospital Admission (non-emergent condition) '[]'  - 0 New Admissions / Biomedical engineer / Ordering NPWT, Apligraf, etc. '[]'  - 0 Emergency Hospital Admission (emergent condition) PROCESS - Special Needs '[]'  - Pediatric / Minor Patient Management 0 '[]'  - 0 Isolation Patient Management '[]'  - 0 Hearing / Language / Visual special needs '[]'  - 0 Assessment of Community assistance (transportation, D/C planning, etc.) '[]'  - 0 Additional assistance / Altered mentation '[]'  - 0 Support Surface(s) Assessment (bed, cushion, seat, etc.) INTERVENTIONS - Miscellaneous '[]'  - External ear exam 0 '[]'  - 0 Patient Transfer (multiple staff / Civil Service fast streamer / Similar devices) '[]'  - 0 Simple Staple / Suture removal (25 or less) '[]'  - 0 Complex Staple / Suture removal (26 or more) '[]'  - 0 Hypo/Hyperglycemic Management (do not check if billed separately) '[]'  - 0 Ankle / Brachial Index (ABI) - do not check if billed separately Has the patient been seen at the hospital within the last three years: Yes Total Score: 0 Level Of  Care: ____ Jason [name] ([id number]) Electronic Signature(s) Signed: 08/02/2021 9:45:44 AM By: Carlene Coria RN Entered By:  Carlene Coria on 07/31/2021 15:31:49 Jason [name], Jason [name] ([id number]) -------------------------------------------------------------------------------- Encounter Discharge Information Details Patient Name: Jason, [name]. Date of Service: 07/31/2021 2:45 PM Medical Record Number: [id number] Patient Account Number: [id number] Date of Birth/Sex: [birth year] (66 y.o. M) Treating RN: Carlene Coria Primary Care Jaquia Benedicto: Viviana Simpler Other Clinician: Referring Taksh Hjort: Viviana Simpler Treating Renso Swett/Extender: Skipper Cliche in Treatment: 3 Encounter Discharge Information Items Post Procedure Vitals Discharge Condition: Stable Temperature (F): 98.3 Ambulatory Status: Ambulatory Pulse (bpm): 69 Discharge Destination: Home Respiratory Rate (breaths/min): 18 Transportation: Private Auto Blood Pressure (mmHg): 152/82 Accompanied By: self Schedule Follow-up Appointment: Yes Clinical Summary of Care: Electronic Signature(s) Signed: 08/02/2021 9:45:44 AM By: Carlene Coria RN Entered By: Carlene Coria on 07/31/2021 15:33:43 Jason [name] ([id number]) -------------------------------------------------------------------------------- Lower Extremity Assessment Details Patient Name: Jason [name]. Date of Service: 07/31/2021 2:45 PM Medical Record Number: [id number] Patient Account Number: [id number] Date of Birth/Sex: [birth year] (66 y.o. M) Treating RN: Carlene Coria Primary Care Herron Fero: Viviana Simpler Other Clinician: Referring Shaconda Hajduk: Viviana Simpler Treating Chole Driver/Extender: Skipper Cliche in Treatment: 3 Electronic Signature(s) Signed: 08/02/2021 9:45:44 AM By: Carlene Coria RN Entered By: Carlene Coria on 07/31/2021 15:10:37 Jason [name] ([id number]) -------------------------------------------------------------------------------- Multi Wound  Chart Details Patient Name: Jason [name]. Date of Service: 07/31/2021 2:45 PM Medical Record Number: [id number] Patient Account Number: [id number] Date of Birth/Sex: 1955/05/08 (66 y.o. M) Treating RN: Carlene Coria Primary Care Dwon Sky: Viviana Simpler Other Clinician: Referring Kotaro Buer: Viviana Simpler Treating Donisha Hoch/Extender: Skipper Cliche in Treatment: 3 Vital Signs Height(in): 69 Pulse(bpm): 69 Weight(lbs): 189 Blood Pressure(mmHg): 142/78 Body Mass Index(BMI): 27.9 Temperature(F): 98.3 Respiratory Rate(breaths/min): 18 Photos: [N/A:N/A] Wound Location: Left Elbow N/A N/A Wounding Event: Pressure Injury N/A N/A Primary Etiology: Pressure Ulcer N/A N/A Comorbid History: Hypertension N/A N/A Date Acquired: 06/24/2021 N/A N/A Weeks of Treatment: 3 N/A N/A Wound Status: Open N/A N/A Wound Recurrence: No N/A N/A Measurements L x W x D (cm) 0.5x0.5x0.4 N/A N/A Area (cm) : 0.196 N/A N/A Volume (cm) : 0.079 N/A N/A % Reduction in Area: 28.70% N/A N/A % Reduction in Volume: 3.70% N/A N/A Classification: Category/Stage III N/A N/A Exudate Amount: Medium N/A N/A Exudate Type: Serosanguineous N/A N/A Exudate Color: red, brown N/A N/A Granulation Amount: Medium (34-66%) N/A N/A Granulation Quality: Pink N/A N/A Necrotic Amount: Small (1-33%) N/A N/A Exposed Structures: Fat Layer (Subcutaneous Tissue): N/A N/A Yes Fascia: No Tendon: No Muscle: No Joint: No Bone: No Epithelialization: None N/A N/A Treatment Notes Electronic Signature(s) Signed: 08/02/2021 9:45:44 AM By: Carlene Coria RN Entered By: Carlene Coria on 07/31/2021 15:11:05 Jason [name] ([id number]) -------------------------------------------------------------------------------- [place] Details Patient Name: Jason [name]. Date of Service: 07/31/2021 2:45 PM Medical Record Number: [id number] Patient Account Number: [id number] Date of Birth/Sex: 08-Mar-1955 (65 y.o.  M) Treating RN: Carlene Coria Primary Care Dashae Wilcher: Viviana Simpler Other Clinician: Referring Bellami Farrelly: Viviana Simpler Treating Shaheen Star/Extender: Skipper Cliche in Treatment: 3 Active Inactive Wound/Skin Impairment Nursing Diagnoses: Knowledge deficit related to ulceration/compromised skin integrity Goals: Patient/caregiver will verbalize understanding of skin care regimen Date Initiated: 07/10/2021 Target Resolution Date: 08/10/2021 Goal Status: Active Ulcer/skin breakdown will have a volume reduction of 30% by week 4 Date Initiated: 07/10/2021 Target Resolution Date: 08/10/2021 Goal Status: Active Ulcer/skin breakdown will have a volume reduction of 50% by week 8 Date Initiated: 07/10/2021 Target Resolution Date: 09/09/2021 Goal Status: Active Ulcer/skin breakdown will have a volume reduction of 80% by week 12 Date Initiated: 07/10/2021 Target Resolution Date: 10/10/2021 Goal Status: Active Ulcer/skin breakdown will  heal within 14 weeks Date Initiated: 07/10/2021 Target Resolution Date: 11/10/2021 Goal Status: Active Interventions: Assess patient/caregiver ability to obtain necessary supplies Assess patient/caregiver ability to perform ulcer/skin care regimen upon admission and as needed Assess ulceration(s) every visit Notes: Electronic Signature(s) Signed: 08/02/2021 9:45:44 AM By: Carlene Coria RN Entered By: Carlene Coria on 07/31/2021 15:10:52 Jason [name] ([id number]) -------------------------------------------------------------------------------- Pain Assessment Details Patient Name: Jason [name]. Date of Service: 07/31/2021 2:45 PM Medical Record Number: [id number] Patient Account Number: [id number] Date of Birth/Sex: Oct 15, 1955 (66 y.o. M) Treating RN: Carlene Coria Primary Care Keiji Melland: Viviana Simpler Other Clinician: Referring Kroy Sprung: Viviana Simpler Treating Samanthia Howland/Extender: Skipper Cliche in Treatment: 3 Active Problems Location of Pain  Severity and Description of Pain Patient Has Paino No Site Locations Pain Management and Medication Current Pain Management: Electronic Signature(s) Signed: 08/02/2021 9:45:44 AM By: Carlene Coria RN Entered By: Carlene Coria on 07/31/2021 15:09:17 Jason [name] ([id number]) -------------------------------------------------------------------------------- Patient/Caregiver Education Details Patient Name: Jason [name]. Date of Service: 07/31/2021 2:45 PM Medical Record Number: [id number] Patient Account Number: [id number] Date of Birth/Gender: 01-18-1956 (66 y.o. M) Treating RN: Carlene Coria Primary Care Physician: Viviana Simpler Other Clinician: Referring Physician: Viviana Simpler Treating Physician/Extender: Skipper Cliche in Treatment: 3 Education Assessment Education Provided To: Patient Education Topics Provided Wound/Skin Impairment: Methods: Explain/Verbal Responses: State content correctly Electronic Signature(s) Signed: 08/02/2021 9:45:44 AM By: Carlene Coria RN Entered By: Carlene Coria on 07/31/2021 15:32:31 Jason [name] ([id number]) -------------------------------------------------------------------------------- Wound Assessment Details Patient Name: Jason [name] Date of Service: 07/31/2021 2:45 PM Medical Record Number: [id number] Patient Account Number: [id number] Date of Birth/Sex: June 11, 1955 (66 y.o. M) Treating RN: Carlene Coria Primary Care Briget Shaheed: Viviana Simpler Other Clinician: Referring Rodel Glaspy: Viviana Simpler Treating Veverly Larimer/Extender: Skipper Cliche in Treatment: 3 Wound Status Wound Number: 1 Primary Etiology: Pressure Ulcer Wound Location: Left Elbow Wound Status: Open Wounding Event: Pressure Injury Comorbid History: Hypertension Date Acquired: 06/24/2021 Weeks Of Treatment: 3 Clustered Wound: No Photos Wound Measurements Length: (cm) 0.5 % Redu Width: (cm) 0.5 % Redu Depth: (cm) 0.2 Epithe Area: (cm) 0.196  [place] Volume: (cm) 0.039 Under Sta End Max ction in Area: 28.7% ction in Volume: 52.4% lialization: None ling: No mining: Yes rting Position (o'clock): 12 ing Position (o'clock): 3 imum Distance: (cm) 1.6 Wound Description Classification: Category/Stage III Foul Exudate Amount: Medium Sloug Exudate Type: Serosanguineous Exudate Color: red, brown Odor After Cleansing: No h/Fibrino Yes Wound Bed Granulation Amount: Medium (34-66%) Exposed Structure Granulation Quality: Pink Fascia Exposed: No Necrotic Amount: Small (1-33%) Fat Layer (Subcutaneous Tissue) Exposed: Yes Tendon Exposed: No Muscle Exposed: No Joint Exposed: No Bone Exposed: No Treatment Notes Wound #1 (Elbow) Wound Laterality: Left Cleanser [place] Ancillary Kit - [address] Day Supply Jason [name], Jason [name] ([id number]) Discharge Instruction: Use supplies as instructed; Kit contains: (15) Saline Bullets; (15) 3x3 Gauze; 15 pr Gloves Peri-Wound Care Topical Primary Dressing [place] compound Discharge Instruction: apply to wound bed Secondary Dressing (SILCONE BORDER) Zetuvit Plus SILICONE BORDER Dressing 5x5 (in/in) Discharge Instruction: Please do not put silicone bordered dressings under wraps. Use non-bordered dressing only. Secured With Compression Wrap Compression Stockings Add-Ons Electronic Signature(s) Signed: 08/02/2021 9:45:44 AM By: Carlene Coria RN Entered By: Carlene Coria on 07/31/2021 15:23:43 Jason [name] ([id number]) -------------------------------------------------------------------------------- Vitals Details Patient Name: Jason [name] Date of Service: 07/31/2021 2:45 PM Medical Record Number: [id number] Patient Account Number: [id number] Date of Birth/Sex: 20-Jul-1955 (66 y.o. M) Treating RN: Carlene Coria Primary Care Deloria Brassfield: Viviana Simpler Other Clinician: Referring Seleni Meller: Viviana Simpler Treating Brandan Glauber/Extender:  Jeri Cos Weeks in Treatment: 3 Vital Signs Time Taken:  15:08 Temperature (F): 98.3 Height (in): 69 Pulse (bpm): 69 Weight (lbs): 189 Respiratory Rate (breaths/min): 18 Body Mass Index (BMI): 27.9 Blood Pressure (mmHg): 142/78 Reference Range: 80 - 120 mg / dl Electronic Signature(s) Signed: 08/02/2021 9:45:44 AM By: Carlene Coria RN Entered By: Carlene Coria on 07/31/2021 15:09:04

## 2021-08-06 DIAGNOSIS — M25612 Stiffness of left shoulder, not elsewhere classified: Secondary | ICD-10-CM | POA: Diagnosis not present

## 2021-08-06 DIAGNOSIS — M25512 Pain in left shoulder: Secondary | ICD-10-CM | POA: Diagnosis not present

## 2021-08-07 ENCOUNTER — Encounter (HOSPITAL_BASED_OUTPATIENT_CLINIC_OR_DEPARTMENT_OTHER): Payer: BC Managed Care – PPO | Admitting: Internal Medicine

## 2021-08-07 DIAGNOSIS — M75102 Unspecified rotator cuff tear or rupture of left shoulder, not specified as traumatic: Secondary | ICD-10-CM

## 2021-08-07 DIAGNOSIS — I10 Essential (primary) hypertension: Secondary | ICD-10-CM

## 2021-08-07 DIAGNOSIS — L89003 Pressure ulcer of unspecified elbow, stage 3: Secondary | ICD-10-CM | POA: Diagnosis not present

## 2021-08-08 ENCOUNTER — Encounter: Payer: Self-pay | Admitting: Internal Medicine

## 2021-08-08 ENCOUNTER — Ambulatory Visit (INDEPENDENT_AMBULATORY_CARE_PROVIDER_SITE_OTHER): Payer: BC Managed Care – PPO | Admitting: Internal Medicine

## 2021-08-08 VITALS — BP 140/90 | HR 94 | Ht 69.0 in | Wt 192.0 lb

## 2021-08-08 DIAGNOSIS — I10 Essential (primary) hypertension: Secondary | ICD-10-CM

## 2021-08-08 DIAGNOSIS — E785 Hyperlipidemia, unspecified: Secondary | ICD-10-CM | POA: Diagnosis not present

## 2021-08-08 DIAGNOSIS — I471 Supraventricular tachycardia: Secondary | ICD-10-CM

## 2021-08-08 DIAGNOSIS — I4729 Other ventricular tachycardia: Secondary | ICD-10-CM

## 2021-08-08 DIAGNOSIS — L89003 Pressure ulcer of unspecified elbow, stage 3: Secondary | ICD-10-CM | POA: Diagnosis not present

## 2021-08-08 NOTE — Patient Instructions (Signed)
Medication Instructions:   Your physician recommends that you continue on your current medications as directed. Please refer to the Current Medication list given to you today.  *If you need a refill on your cardiac medications before your next appointment, please call your pharmacy*  Monitor your blood pressure at home twice a week and let us know if BP consistently greater than 140/90.   Lab Work:  None ordered  Testing/Procedures:  None ordered   Follow-Up: At [facility], you and your health needs are our priority.  As part of our continuing mission to provide you with exceptional heart care, we have created designated Provider Care Teams.  These Care Teams include your primary Cardiologist (physician) and Advanced Practice Providers (APPs -  Physician Assistants and Nurse Practitioners) who all work together to provide you with the care you need, when you need it.  We recommend signing up for the patient portal called "MyChart".  Sign up information is provided on this After Visit Summary.  MyChart is used to connect with patients for Virtual Visits (Telemedicine).  Patients are able to view lab/test results, encounter notes, upcoming appointments, etc.  Non-urgent messages can be sent to your provider as well.   To learn more about what you can do with MyChart, go to [email or web address].    Your next appointment:   6 month(s)  The format for your next appointment:   In Person  Provider:   You may see Nelva Bush, MD or one of the following Advanced Practice Providers on your designated Care Team:   Murray Hodgkins, NP Christell Faith, PA-C Cadence Kathlen Mody, PA-C{   Important Information About Sugar

## 2021-08-08 NOTE — Progress Notes (Unsigned)
Follow-up Outpatient Visit Date: 08/08/2021  Primary Care Provider: Venia Carbon, MD [place] [address]  Chief Complaint: Follow-up hypertension and arrhythmias  HPI:  Jason [name] is a 66 y.o. male with history of hypertension, thyroid disease, GERD, and osteoarthritis, who presents for follow-up of palpitations.  I last saw him in 01/2021, at which time he was feeling well without any further palpitations.  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  We elected to continue his current dose of metoprolol and forego additional testing.  Today, Jason [name] reports that he is feeling fairly well, though he still has some issues related to his left shoulder surgery in March.  He has difficulty lifting his left arm and is now scheduled to undergo nerve testing.  He denies chest pain, shortness of breath, palpitations, lightheadedness and edema.  He has not been checking his blood pressure regularly since his shoulder surgery.  He notes that it was running high prior to the surgery, which he attributes to pain.  --------------------------------------------------------------------------------------------------  Past Medical History:  Diagnosis Date   Allergy    Arthritis    osteoarthritis   GERD (gastroesophageal reflux disease)    Hypertension    Thyroid disease    Hyperthyroidism s/p radioactive iodine ablation   Past Surgical History:  Procedure Laterality Date   CARDIAC CATHETERIZATION  03/2000   HERNIA REPAIR  [id number]   umbilical   LEFT HEART CATH AND CORONARY ANGIOGRAPHY Left 03/20/2020   Procedure: LEFT HEART CATH AND CORONARY ANGIOGRAPHY;  Surgeon: Nelva Bush, MD;  Location: [facility];  Service: Cardiovascular;  Laterality: Left;   RADIOLOGY WITH ANESTHESIA Left 04/18/2021   Procedure: MRI SHOULDER WITHOUT CONTRAST WITH ANESTHESIA;  Surgeon: Radiologist, Medication, MD;  Location: [facility];  Service: Radiology;  Laterality: Left;   REVERSE  SHOULDER ARTHROPLASTY Left    ROTATOR CUFF REPAIR  11/2010   Dr [name] Heck   TONSILLECTOMY     TOTAL HIP ARTHROPLASTY Right 02/24/2009   TOTAL HIP ARTHROPLASTY Left 06/2016   Dr Harlow Mares    Current Meds  Medication Sig   acetaminophen (TYLENOL) 325 MG tablet Take 650 mg by mouth 2 (two) times daily as needed for mild pain.   amoxicillin (AMOXIL) 500 MG capsule Take 4 tablets by mouth prior to dental procedures.   Ascorbic Acid (VITA-C PO) Take 1,000 mg by mouth daily.   cetirizine (ZYRTEC) 10 MG tablet Take 10 mg by mouth daily.   CRANBERRY PO Take 1 tablet by mouth daily.   cyclobenzaprine (FLEXERIL) 10 MG tablet TAKE 1 TABLET BY MOUTH EVERY DAY AT NIGHT AS NEEDED   levothyroxine (SYNTHROID) 88 MCG tablet TAKE 1 TABLET BY MOUTH EVERY DAY   lisinopril (ZESTRIL) 20 MG tablet TAKE 1 TABLET BY MOUTH EVERY DAY   loratadine (CLARITIN) 10 MG tablet Take 10 mg by mouth 2 (two) times daily.   MAGNESIUM OXIDE PO Take 250 mg by mouth daily.   meloxicam (MOBIC) 15 MG tablet Take 1 tablet (15 mg total) by mouth daily.   metoprolol succinate (TOPROL-XL) 100 MG 24 hr tablet TAKE 1 TABLET BY MOUTH EVERY DAY WITH OR IMMEDIATELY FOLLOWING A MEAL   Multiple Vitamin (MULTIVITAMIN) tablet Take 1 tablet by mouth daily.   oxyCODONE-acetaminophen (PERCOCET/ROXICET) 5-325 MG tablet Take 1 tablet by mouth every 4 (four) hours as needed for severe pain.   potassium chloride (KLOR-CON) 10 MEQ tablet TAKE 1 TABLET BY MOUTH EVERY DAY   rosuvastatin (CRESTOR) 5 MG  tablet TAKE 1 TABLET (5 MG TOTAL) BY MOUTH DAILY.   sildenafil (REVATIO) 20 MG tablet Take 3-5 tablets (60-100 mg total) by mouth daily as needed.   spironolactone (ALDACTONE) 25 MG tablet TAKE 1/2 TABLET BY MOUTH EVERY DAY   tamsulosin (FLOMAX) 0.4 MG CAPS capsule TAKE 1 CAPSULE BY MOUTH EVERY DAY   torsemide (DEMADEX) 10 MG tablet Take 1 tablet (10 mg total) by mouth daily.    Allergies: Patient has no known allergies.  Social History   Tobacco Use    Smoking status: Never   Smokeless tobacco: Never  Vaping Use   Vaping Use: Never used  Substance Use Topics   Alcohol use: Not Currently    Comment: Alcohol once every few weeks/months   Drug use: No    Family History  Problem Relation Age of Onset   Hypertension Mother    Stroke Mother    Gout Father    Heart attack Father 25   Heart disease Sister    Colon cancer Neg Hx    Esophageal cancer Neg Hx    Rectal cancer Neg Hx    Stomach cancer Neg Hx     Review of Systems: A 12-system review of systems was performed and was negative except as noted in the HPI.  --------------------------------------------------------------------------------------------------  Physical Exam: BP 140/90 (BP Location: Right Arm, Patient Position: Sitting, Cuff Size: Normal)   Pulse 94   Ht '5\' 9"'$  (1.753 m)   Wt 192 lb (87.1 kg)   SpO2 98%   BMI 28.35 kg/m   General:  NAD. Neck: No JVD or HJR. Lungs: Clear to auscultation bilaterally without wheezes or crackles. Heart: Regular rate and rhythm with occasional extrasystoles.  No murmurs, rubs, or gallops. Abdomen: Soft, nontender, nondistended. Extremities: No lower extremity edema.  EKG:  NSR with isolated PVC, borderline LVH, and nonspecific ST changes.  PVC is new since 03/11/2021.  Otherwise, no significant interval change.  Lab Results  Component Value Date   WBC 24.0 (H) 06/02/2021   HGB 12.3 (L) 06/02/2021   HCT 36.8 (L) 06/02/2021   MCV 92.2 06/02/2021   PLT 445 (H) 06/02/2021    Lab Results  Component Value Date   NA 128 (L) 06/02/2021   K 3.7 06/02/2021   CL 91 (L) 06/02/2021   CO2 28 06/02/2021   BUN 23 06/02/2021   CREATININE 0.72 06/02/2021   GLUCOSE 126 (H) 06/02/2021   ALT 23 06/02/2021    Lab Results  Component Value Date   CHOL 125 01/29/2021   HDL 49 01/29/2021   LDLCALC 62 01/29/2021   TRIG 69 01/29/2021   CHOLHDL 2.6 01/29/2021     --------------------------------------------------------------------------------------------------  ASSESSMENT AND PLAN: 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  Continue metoprolol.  Will recheck electrolytes today given lisinopril, spironolactone, and torsemide therapy.  Hypertension: BP mildly elevated today.  We discussed escalation of his antihypertensive regimen, but Mr. Umble wishes to monitor his BP at home before making any changes.  He will alert [place] in about a month if his readings are consistently above 140/90.  Hyperlipidemia: Continue low-dose rosuvastatin for now.  LDL very well-controlled on last check.  Though given absence of established ASCVD, we could consider stopping this in the future.  Follow-up: Return to clinic in 6 months.  Nelva Bush, MD 08/08/2021 4:15 PM

## 2021-08-10 ENCOUNTER — Encounter: Payer: Self-pay | Admitting: Internal Medicine

## 2021-08-13 DIAGNOSIS — M25512 Pain in left shoulder: Secondary | ICD-10-CM | POA: Diagnosis not present

## 2021-08-13 DIAGNOSIS — M25612 Stiffness of left shoulder, not elsewhere classified: Secondary | ICD-10-CM | POA: Diagnosis not present

## 2021-08-14 ENCOUNTER — Encounter: Payer: BC Managed Care – PPO | Admitting: Internal Medicine

## 2021-08-18 ENCOUNTER — Other Ambulatory Visit: Payer: Self-pay | Admitting: Internal Medicine

## 2021-08-21 ENCOUNTER — Encounter: Payer: BC Managed Care – PPO | Admitting: Internal Medicine

## 2021-08-21 DIAGNOSIS — I10 Essential (primary) hypertension: Secondary | ICD-10-CM | POA: Diagnosis not present

## 2021-08-21 DIAGNOSIS — L89003 Pressure ulcer of unspecified elbow, stage 3: Secondary | ICD-10-CM | POA: Diagnosis not present

## 2021-08-21 DIAGNOSIS — M75102 Unspecified rotator cuff tear or rupture of left shoulder, not specified as traumatic: Secondary | ICD-10-CM | POA: Diagnosis not present

## 2021-08-22 DIAGNOSIS — M25612 Stiffness of left shoulder, not elsewhere classified: Secondary | ICD-10-CM | POA: Diagnosis not present

## 2021-08-22 DIAGNOSIS — M5412 Radiculopathy, cervical region: Secondary | ICD-10-CM | POA: Diagnosis not present

## 2021-08-22 DIAGNOSIS — M25512 Pain in left shoulder: Secondary | ICD-10-CM | POA: Diagnosis not present

## 2021-08-23 NOTE — Progress Notes (Signed)
NEZIAH, BRALEY ([id number]) Visit Report for 08/21/2021 Arrival Information Details Patient Name: Jason [name], Jason [name]. Date of Service: 08/21/2021 2:45 PM Medical Record Number: [id number] Patient Account Number: [id number] Date of Birth/Sex: 1955-08-01 (66 y.o. M) Treating RN: Carlene Coria Primary Care Shamona Wirtz: Viviana Simpler Other Clinician: Referring Danene Montijo: Viviana Simpler Treating Zakira Ressel/Extender: Yaakov Guthrie in Treatment: 6 Visit Information History Since Last Visit All ordered tests and consults were completed: No Patient Arrived: Ambulatory Added or deleted any medications: No Arrival Time: 14:45 Any new allergies or adverse reactions: No Accompanied By: self Had a fall or experienced change in No Transfer Assistance: None activities of daily living that may affect Patient Identification Verified: Yes risk of falls: Secondary Verification Process Completed: Yes Signs or symptoms of abuse/neglect since last visito No Patient Requires Transmission-Based Precautions: No Hospitalized since last visit: No Patient Has Alerts: No Implantable device outside of the clinic excluding No cellular tissue based products placed in the center since last visit: Has Dressing in Place as Prescribed: Yes Pain Present Now: No Electronic Signature(s) Signed: 08/23/2021 10:10:35 AM By: Carlene Coria RN Entered By: Carlene Coria on 08/21/2021 14:51:14 Jason [name] ([id number]) -------------------------------------------------------------------------------- Clinic Level of Care Assessment Details Patient Name: Jason [name] Date of Service: 08/21/2021 2:45 PM Medical Record Number: [id number] Patient Account Number: [id number] Date of Birth/Sex: [birth year] (66 y.o. M) Treating RN: Carlene Coria Primary Care Maite Burlison: Viviana Simpler Other Clinician: Referring Maloree Uplinger: Viviana Simpler Treating Macrae Wiegman/Extender: Yaakov Guthrie in Treatment: 6 Clinic Level of  Care Assessment Items TOOL 4 Quantity Score X - Use when only an EandM is performed on FOLLOW-UP visit 1 0 ASSESSMENTS - Nursing Assessment / Reassessment X - Reassessment of Co-morbidities (includes updates in patient status) 1 10 X- 1 5 Reassessment of Adherence to Treatment Plan ASSESSMENTS - Wound and Skin Assessment / Reassessment X - Simple Wound Assessment / Reassessment - one wound 1 5 '[]'  - 0 Complex Wound Assessment / Reassessment - multiple wounds '[]'  - 0 Dermatologic / Skin Assessment (not related to wound area) ASSESSMENTS - Focused Assessment '[]'  - Circumferential Edema Measurements - multi extremities 0 '[]'  - 0 Nutritional Assessment / Counseling / Intervention '[]'  - 0 Lower Extremity Assessment (monofilament, tuning fork, pulses) '[]'  - 0 Peripheral Arterial Disease Assessment (using hand held doppler) ASSESSMENTS - Ostomy and/or Continence Assessment and Care '[]'  - Incontinence Assessment and Management 0 '[]'  - 0 Ostomy Care Assessment and Management (repouching, etc.) PROCESS - Coordination of Care X - Simple Patient / Family Education for ongoing care 1 15 '[]'  - 0 Complex (extensive) Patient / Family Education for ongoing care '[]'  - 0 Staff obtains Programmer, systems, Records, Test Results / Process Orders '[]'  - 0 Staff telephones HHA, Nursing Homes / Clarify orders / etc '[]'  - 0 Routine Transfer to another Facility (non-emergent condition) '[]'  - 0 Routine Hospital Admission (non-emergent condition) '[]'  - 0 New Admissions / Biomedical engineer / Ordering NPWT, Apligraf, etc. '[]'  - 0 Emergency Hospital Admission (emergent condition) X- 1 10 Simple Discharge Coordination '[]'  - 0 Complex (extensive) Discharge Coordination PROCESS - Special Needs '[]'  - Pediatric / Minor Patient Management 0 '[]'  - 0 Isolation Patient Management '[]'  - 0 Hearing / Language / Visual special needs '[]'  - 0 Assessment of Community assistance (transportation, D/C planning, etc.) '[]'  - 0 Additional  assistance / Altered mentation '[]'  - 0 Support Surface(s) Assessment (bed, cushion, seat, etc.) INTERVENTIONS - Wound Cleansing / Measurement Leeson, Jeanne E. ([id number]) X- 1 5 Simple Wound Cleansing -  one wound '[]'  - 0 Complex Wound Cleansing - multiple wounds X- 1 5 Wound Imaging (photographs - any number of wounds) '[]'  - 0 Wound Tracing (instead of photographs) X- 1 5 Simple Wound Measurement - one wound '[]'  - 0 Complex Wound Measurement - multiple wounds INTERVENTIONS - Wound Dressings X - Small Wound Dressing one or multiple wounds 1 10 '[]'  - 0 Medium Wound Dressing one or multiple wounds '[]'  - 0 Large Wound Dressing one or multiple wounds X- 1 5 Application of Medications - topical '[]'  - 0 Application of Medications - injection INTERVENTIONS - Miscellaneous '[]'  - External ear exam 0 '[]'  - 0 Specimen Collection (cultures, biopsies, blood, body fluids, etc.) '[]'  - 0 Specimen(s) / Culture(s) sent or taken to Lab for analysis '[]'  - 0 Patient Transfer (multiple staff / Civil Service fast streamer / Similar devices) '[]'  - 0 Simple Staple / Suture removal (25 or less) '[]'  - 0 Complex Staple / Suture removal (26 or more) '[]'  - 0 Hypo / Hyperglycemic Management (close monitor of Blood Glucose) '[]'  - 0 Ankle / Brachial Index (ABI) - do not check if billed separately X- 1 5 Vital Signs Has the patient been seen at the hospital within the last three years: Yes Total Score: 80 Level Of Care: New/Established - Level 3 Electronic Signature(s) Signed: 08/23/2021 10:10:35 AM By: Carlene Coria RN Entered By: Carlene Coria on 08/21/2021 15:17:56 Jason [name] ([id number]) -------------------------------------------------------------------------------- Encounter Discharge Information Details Patient Name: Jason [name]. Date of Service: 08/21/2021 2:45 PM Medical Record Number: [id number] Patient Account Number: [id number] Date of Birth/Sex: 03-May-1955 (66 y.o. M) Treating RN: Carlene Coria Primary  Care Keyvon Herter: Viviana Simpler Other Clinician: Referring Floy Riegler: Viviana Simpler Treating Guilford Shannahan/Extender: Yaakov Guthrie in Treatment: 6 Encounter Discharge Information Items Discharge Condition: Stable Ambulatory Status: Ambulatory Discharge Destination: Home Transportation: Private Auto Accompanied By: self Schedule Follow-up Appointment: Yes Clinical Summary of Care: Patient Declined Electronic Signature(s) Signed: 08/23/2021 10:10:35 AM By: Carlene Coria RN Entered By: Carlene Coria on 08/21/2021 15:18:56 Jason [name] ([id number]) -------------------------------------------------------------------------------- Lower Extremity Assessment Details Patient Name: Jason [name]. Date of Service: 08/21/2021 2:45 PM Medical Record Number: [id number] Patient Account Number: [id number] Date of Birth/Sex: [birth year] (66 y.o. M) Treating RN: Carlene Coria Primary Care Jennesis Ramaswamy: Viviana Simpler Other Clinician: Referring Katye Valek: Viviana Simpler Treating Misbah Hornaday/Extender: Yaakov Guthrie in Treatment: 6 Electronic Signature(s) Signed: 08/23/2021 10:10:35 AM By: Carlene Coria RN Entered By: Carlene Coria on 08/21/2021 15:16:21 Jason [name] ([id number]) -------------------------------------------------------------------------------- Multi Wound Chart Details Patient Name: Jason [name]. Date of Service: 08/21/2021 2:45 PM Medical Record Number: [id number] Patient Account Number: [id number] Date of Birth/Sex: 1955-07-03 (66 y.o. M) Treating RN: Carlene Coria Primary Care Kanden Carey: Viviana Simpler Other Clinician: Referring Rashad Obeid: Viviana Simpler Treating Zigmund Linse/Extender: Yaakov Guthrie in Treatment: 6 Vital Signs Height(in): 69 Pulse(bpm): 14 Weight(lbs): 189 Blood Pressure(mmHg): 137/83 Body Mass Index(BMI): 27.9 Temperature(F): 98.5 Respiratory Rate(breaths/min): 18 Photos: [1:No Photos] [N/A:N/A] Wound Location: [1:Left Elbow]  [N/A:N/A] Wounding Event: [1:Pressure Injury] [N/A:N/A] Primary Etiology: [1:Pressure Ulcer] [N/A:N/A] Comorbid History: [1:Hypertension] [N/A:N/A] Date Acquired: [1:06/24/2021] [N/A:N/A] Weeks of Treatment: [1:6] [N/A:N/A] Wound Status: [1:Open] [N/A:N/A] Wound Recurrence: [1:No] [N/A:N/A] Measurements L x W x D (cm) [1:0.1x0.1x0.2] [N/A:N/A] Area (cm) : [1:0.008] [N/A:N/A] Volume (cm) : [1:0.002] [N/A:N/A] % Reduction in Area: [1:97.10%] [N/A:N/A] % Reduction in Volume: [1:97.60%] [N/A:N/A] Classification: [1:Category/Stage III] [N/A:N/A] Exudate Amount: [1:Medium] [N/A:N/A] Exudate Type: [1:Serosanguineous] [N/A:N/A] Exudate Color: [1:red, brown] [N/A:N/A] Granulation Amount: [1:Large (67-100%)] [N/A:N/A] Granulation Quality: [1:Pink] [N/A:N/A] Necrotic Amount: [1:None Present (  0%)] [N/A:N/A] Exposed Structures: [1:Fat Layer (Subcutaneous Tissue): Yes Fascia: No Tendon: No Muscle: No Joint: No Bone: No None] [N/A:N/A N/A] Treatment Notes Electronic Signature(s) Signed: 08/23/2021 10:10:35 AM By: Carlene Coria RN Entered By: Carlene Coria on 08/21/2021 15:16:40 Jason [name] ([id number]) -------------------------------------------------------------------------------- [place] Details Patient Name: Jason [name]. Date of Service: 08/21/2021 2:45 PM Medical Record Number: [id number] Patient Account Number: [id number] Date of Birth/Sex: February 24, 1956 (66 y.o. M) Treating RN: Carlene Coria Primary Care Kenedie Dirocco: Viviana Simpler Other Clinician: Referring Arul Farabee: Viviana Simpler Treating Yaffa Seckman/Extender: Yaakov Guthrie in Treatment: 6 Active Inactive Wound/Skin Impairment Nursing Diagnoses: Knowledge deficit related to ulceration/compromised skin integrity Goals: Patient/caregiver will verbalize understanding of skin care regimen Date Initiated: 07/10/2021 Target Resolution Date: 08/10/2021 Goal Status: Active Ulcer/skin breakdown will have a  volume reduction of 30% by week 4 Date Initiated: 07/10/2021 Target Resolution Date: 08/10/2021 Goal Status: Active Ulcer/skin breakdown will have a volume reduction of 50% by week 8 Date Initiated: 07/10/2021 Target Resolution Date: 09/09/2021 Goal Status: Active Ulcer/skin breakdown will have a volume reduction of 80% by week 12 Date Initiated: 07/10/2021 Target Resolution Date: 10/10/2021 Goal Status: Active Ulcer/skin breakdown will heal within 14 weeks Date Initiated: 07/10/2021 Target Resolution Date: 11/10/2021 Goal Status: Active Interventions: Assess patient/caregiver ability to obtain necessary supplies Assess patient/caregiver ability to perform ulcer/skin care regimen upon admission and as needed Assess ulceration(s) every visit Notes: Electronic Signature(s) Signed: 08/23/2021 10:10:35 AM By: Carlene Coria RN Entered By: Carlene Coria on 08/21/2021 15:16:25 Jason [name] ([id number]) -------------------------------------------------------------------------------- Pain Assessment Details Patient Name: Jason [name]. Date of Service: 08/21/2021 2:45 PM Medical Record Number: [id number] Patient Account Number: [id number] Date of Birth/Sex: 04-Dec-1955 (66 y.o. M) Treating RN: Carlene Coria Primary Care Llesenia Fogal: Viviana Simpler Other Clinician: Referring Shalena Ezzell: Viviana Simpler Treating Doyce Saling/Extender: Yaakov Guthrie in Treatment: 6 Active Problems Location of Pain Severity and Description of Pain Patient Has Paino No Site Locations Pain Management and Medication Current Pain Management: Electronic Signature(s) Signed: 08/23/2021 10:10:35 AM By: Carlene Coria RN Entered By: Carlene Coria on 08/21/2021 14:52:31 Jason [name] ([id number]) -------------------------------------------------------------------------------- Patient/Caregiver Education Details Patient Name: Jason [name]. Date of Service: 08/21/2021 2:45 PM Medical Record Number:  [id number] Patient Account Number: [id number] Date of Birth/Gender: [birth year] (66 y.o. M) Treating RN: Carlene Coria Primary Care Physician: Viviana Simpler Other Clinician: Referring Physician: Viviana Simpler Treating Physician/Extender: Yaakov Guthrie in Treatment: 6 Education Assessment Education Provided To: Patient Education Topics Provided Wound/Skin Impairment: Methods: Explain/Verbal Responses: State content correctly Electronic Signature(s) Signed: 08/23/2021 10:10:35 AM By: Carlene Coria RN Entered By: Carlene Coria on 08/21/2021 15:18:18 Jason [name] ([id number]) -------------------------------------------------------------------------------- Wound Assessment Details Patient Name: Jason [name] Date of Service: 08/21/2021 2:45 PM Medical Record Number: [id number] Patient Account Number: [id number] Date of Birth/Sex: 11/22/1955 (66 y.o. M) Treating RN: Carlene Coria Primary Care Shayle Donahoo: Viviana Simpler Other Clinician: Referring Anniyah Mood: Viviana Simpler Treating Kellianne Ek/Extender: Yaakov Guthrie in Treatment: 6 Wound Status Wound Number: 1 Primary Etiology: Pressure Ulcer Wound Location: Left Elbow Wound Status: Open Wounding Event: Pressure Injury Comorbid History: Hypertension Date Acquired: 06/24/2021 Weeks Of Treatment: 6 Clustered Wound: No Wound Measurements Length: (cm) 0.1 Width: (cm) 0.1 Depth: (cm) 0.2 Area: (cm) 0.008 Volume: (cm) 0.002 % Reduction in Area: 97.1% % Reduction in Volume: 97.6% Epithelialization: None Tunneling: No Undermining: No Wound Description Classification: Category/Stage III Exudate Amount: Medium Exudate Type: Serosanguineous Exudate Color: red, brown Foul Odor After Cleansing: No Slough/Fibrino No Wound Bed Granulation Amount: Large (67-100%) Exposed Structure  Granulation Quality: Pink Fascia Exposed: No Necrotic Amount: None Present (0%) Fat Layer (Subcutaneous Tissue) Exposed: Yes Tendon  Exposed: No Muscle Exposed: No Joint Exposed: No Bone Exposed: No Treatment Notes Wound #1 (Elbow) Wound Laterality: Left Cleanser [place] Ancillary Kit - 15 Day Supply Discharge Instruction: Use supplies as instructed; Kit contains: (15) Saline Bullets; (15) 3x3 Gauze; 15 pr Gloves Peri-Wound Care Topical Primary Dressing Keystone compound Discharge Instruction: apply to wound bed Secondary Dressing (SILCONE BORDER) Zetuvit Plus SILICONE BORDER Dressing 5x5 (in/in) Discharge Instruction: Please do not put silicone bordered dressings under wraps. Use non-bordered dressing only. Secured With Compression Wrap Compression Stockings HIRO, VIPOND ([id number]) Add-Ons Electronic Signature(s) Signed: 08/23/2021 10:10:35 AM By: Carlene Coria RN Entered By: Carlene Coria on 08/21/2021 15:16:11 Jason [name] ([id number]) -------------------------------------------------------------------------------- Vitals Details Patient Name: Jason [name] Date of Service: 08/21/2021 2:45 PM Medical Record Number: [id number] Patient Account Number: [id number] Date of Birth/Sex: 10-15-1955 (66 y.o. M) Treating RN: Carlene Coria Primary Care Plumer Mittelstaedt: Viviana Simpler Other Clinician: Referring Korynne Dols: Viviana Simpler Treating Darin Arndt/Extender: Yaakov Guthrie in Treatment: 6 Vital Signs Time Taken: 14:51 Temperature (F): 98.5 Height (in): 69 Pulse (bpm): 86 Weight (lbs): 189 Respiratory Rate (breaths/min): 18 Body Mass Index (BMI): 27.9 Blood Pressure (mmHg): 137/83 Reference Range: 80 - 120 mg / dl Electronic Signature(s) Signed: 08/23/2021 10:10:35 AM By: Carlene Coria RN Entered By: Carlene Coria on 08/21/2021 14:51:55

## 2021-08-29 DIAGNOSIS — M5412 Radiculopathy, cervical region: Secondary | ICD-10-CM | POA: Diagnosis not present

## 2021-09-04 ENCOUNTER — Encounter: Payer: BC Managed Care – PPO | Attending: Internal Medicine | Admitting: Internal Medicine

## 2021-09-04 DIAGNOSIS — M75102 Unspecified rotator cuff tear or rupture of left shoulder, not specified as traumatic: Secondary | ICD-10-CM | POA: Insufficient documentation

## 2021-09-04 DIAGNOSIS — L89003 Pressure ulcer of unspecified elbow, stage 3: Secondary | ICD-10-CM | POA: Insufficient documentation

## 2021-09-04 DIAGNOSIS — I10 Essential (primary) hypertension: Secondary | ICD-10-CM | POA: Insufficient documentation

## 2021-09-04 NOTE — Progress Notes (Signed)
DACEN, FRAYRE ([id number]) Visit Report for 09/04/2021 Chief Complaint Document Details Patient Name: Jason [name], Jason [name]. Date of Service: 09/04/2021 2:45 PM Medical Record Number: [id number] Patient Account Number: [id number] Date of Birth/Sex: March 27, 1955 (66 y.o. M) Treating RN: Levora Dredge Primary Care Provider: Viviana Simpler Other Clinician: Referring Provider: Viviana Simpler Treating Provider/Extender: Yaakov Guthrie in Treatment: 8 Information Obtained from: Patient Chief Complaint 07/10/2021; left elbow ulcer Electronic Signature(s) Signed: 09/04/2021 3:56:24 PM By: Kalman Shan DO Entered By: Kalman Shan on 09/04/2021 15:48:56 Jason [name] ([id number]) -------------------------------------------------------------------------------- HPI Details Patient Name: Jason [name] Date of Service: 09/04/2021 2:45 PM Medical Record Number: [id number] Patient Account Number: [id number] Date of Birth/Sex: Apr 26, 1955 (66 y.o. M) Treating RN: Levora Dredge Primary Care Provider: Viviana Simpler Other Clinician: Referring Provider: Viviana Simpler Treating Provider/Extender: Yaakov Guthrie in Treatment: 8 History of Present Illness HPI Description: Admission 07/10/2021 [name] is a 66 year old male with a past medical history of left rotator cuff status post shoulder surgery 04/18/2021, hypertension that presents to the clinic for a 6-week history of nonhealing ulcer to the left elbow. He has been wearing a sling for the past 2 to 3 months and this created a pressure injury to his left elbow that eventually opened. He has been using Betadine to the area and keeping it covered. He has cut his sling to relieve some of the pressure. He does sleep in a recliner putting pressure on the armrest to his elbow. He currently denies signs of infection. 5/24; patient presents for follow-up. He obtained [facility] antibiotics today. He has been using  mupirocin ointment with collagen daily to the wound bed. He has no issues or complaints today. He denies signs of infection. 5/31; patient presents for follow-up. He has been using [facility] antibiotics daily. He has no issues or complaints today. He denies signs of infection. He has noticed improvement in wound healing. 08-01-2021 upon evaluation today patient appears to be doing well with regard to his elbow ulcer he does have a small what appears to be calcium deposit in the middle of the wound I think we need to try to clean this out to allow it to heal more effectively other than that though this seems to be doing excellent in my opinion. I see no signs of active infection locally or systemically. 6/14; patient presents for follow-up. He has been using Keystone antibiotic to his left elbow wound. He denies signs of infection. He has no issues or complaints today. 6/28; patient presents for follow-up. He continues to use [place] antibiotic to the left elbow with benefit. He has no issues or complaints today. He denies signs of infection. 7/12; patient presents for follow-up. He has been using Keystone antibiotic to the left elbow wound with benefit. He denies drainage. Electronic Signature(s) Signed: 09/04/2021 3:56:24 PM By: Kalman Shan DO Entered By: Kalman Shan on 09/04/2021 15:49:23 Jason [name] ([id number]) -------------------------------------------------------------------------------- Physical Exam Details Patient Name: Jason [name] Date of Service: 09/04/2021 2:45 PM Medical Record Number: [id number] Patient Account Number: [id number] Date of Birth/Sex: 04-05-1955 (66 y.o. M) Treating RN: Levora Dredge Primary Care Provider: Viviana Simpler Other Clinician: Referring Provider: Viviana Simpler Treating Provider/Extender: Yaakov Guthrie in Treatment: 8 Constitutional . Psychiatric . Notes Left elbow: Epithelization to the previous wound  site. Electronic Signature(s) Signed: 09/04/2021 3:56:24 PM By: Kalman Shan DO Entered By: Kalman Shan on 09/04/2021 15:49:47 Jason [name] ([id number]) -------------------------------------------------------------------------------- Physician Orders Details Patient Name: Jason [name] Date of Service: 09/04/2021 2:45  PM Medical Record Number: [id number] Patient Account Number: [id number] Date of Birth/Sex: 08/02/1955 (65 y.o. M) Treating RN: Carlene Coria Primary Care Provider: Viviana Simpler Other Clinician: Referring Provider: Viviana Simpler Treating Provider/Extender: Yaakov Guthrie in Treatment: 8 Verbal / Phone Orders: No Diagnosis Coding Discharge From [facility] Services o Discharge from [place] Treatment Complete - keep area covered for protection Electronic Signature(s) Signed: 09/04/2021 3:56:24 PM By: Kalman Shan DO Previous Signature: 09/04/2021 3:49:11 PM Version By: Carlene Coria RN Entered By: Kalman Shan on 09/04/2021 15:51:07 Jason [name] ([id number]) -------------------------------------------------------------------------------- Problem List Details Patient Name: Jason [name] Date of Service: 09/04/2021 2:45 PM Medical Record Number: [id number] Patient Account Number: [id number] Date of Birth/Sex: Nov 05, 1955 (66 y.o. M) Treating RN: Levora Dredge Primary Care Provider: Viviana Simpler Other Clinician: Referring Provider: Viviana Simpler Treating Provider/Extender: Yaakov Guthrie in Treatment: 8 Active Problems ICD-10 Encounter Code Description Active Date MDM Diagnosis L89.003 Pressure ulcer of unspecified elbow, stage 3 07/10/2021 No Yes M75.102 Unspecified rotator cuff tear or rupture of left shoulder, not specified as 07/10/2021 No Yes traumatic I10 Essential (primary) hypertension 07/10/2021 No Yes Inactive Problems Resolved Problems Electronic Signature(s) Signed: 09/04/2021 3:56:24 PM By:  Kalman Shan DO Entered By: Kalman Shan on 09/04/2021 15:48:54 Jason [name] ([id number]) -------------------------------------------------------------------------------- Progress Note Details Patient Name: Jason [name]. Date of Service: 09/04/2021 2:45 PM Medical Record Number: [id number] Patient Account Number: [id number] Date of Birth/Sex: October 13, 1955 (66 y.o. M) Treating RN: Levora Dredge Primary Care Provider: Viviana Simpler Other Clinician: Referring Provider: Viviana Simpler Treating Provider/Extender: Yaakov Guthrie in Treatment: 8 Subjective Chief Complaint Information obtained from Patient 07/10/2021; left elbow ulcer History of Present Illness (HPI) Admission 07/10/2021 Mr. Markelle Najarian is a 66 year old male with a past medical history of left rotator cuff status post shoulder surgery 04/18/2021, hypertension that presents to the clinic for a 6-week history of nonhealing ulcer to the left elbow. He has been wearing a sling for the past 2 to 3 months and this created a pressure injury to his left elbow that eventually opened. He has been using Betadine to the area and keeping it covered. He has cut his sling to relieve some of the pressure. He does sleep in a recliner putting pressure on the armrest to his elbow. He currently denies signs of infection. 5/24; patient presents for follow-up. He obtained Keystone antibiotics today. He has been using mupirocin ointment with collagen daily to the wound bed. He has no issues or complaints today. He denies signs of infection. 5/31; patient presents for follow-up. He has been using Keystone antibiotics daily. He has no issues or complaints today. He denies signs of infection. He has noticed improvement in wound healing. 08-01-2021 upon evaluation today patient appears to be doing well with regard to his elbow ulcer he does have a small what appears to be calcium deposit in the middle of the wound I think we need  to try to clean this out to allow it to heal more effectively other than that though this seems to be doing excellent in my opinion. I see no signs of active infection locally or systemically. 6/14; patient presents for follow-up. He has been using Keystone antibiotic to his left elbow wound. He denies signs of infection. He has no issues or complaints today. 6/28; patient presents for follow-up. He continues to use [place] antibiotic to the left elbow with benefit. He has no issues or complaints today. He denies signs of infection. 7/12; patient presents for follow-up. He  has been using Keystone antibiotic to the left elbow wound with benefit. He denies drainage. Objective Constitutional Vitals Time Taken: 2:48 PM, Height: 69 in, Weight: 189 lbs, BMI: 27.9, Temperature: 97.8 F, Pulse: 85 bpm, Respiratory Rate: 18 breaths/min, Blood Pressure: 153/92 mmHg. General Notes: Left elbow: Epithelization to the previous wound site. Integumentary (Hair, Skin) Wound #1 status is Open. Original cause of wound was Pressure Injury. The date acquired was: 06/24/2021. The wound has been in treatment 8 weeks. The wound is located on the Left Elbow. The wound measures 0cm length x 0cm width x 0cm depth; 0cm^2 area and 0cm^3 volume. There is no tunneling or undermining noted. There is a none present amount of drainage noted. There is no granulation within the wound bed. There is no necrotic tissue within the wound bed. Assessment Active Problems ICD-10 NICKY, KRAS ([id number]) Pressure ulcer of unspecified elbow, stage 3 Unspecified rotator cuff tear or rupture of left shoulder, not specified as traumatic Essential (primary) hypertension Patient has done well with Keystone antibiotics. The wound has healed. I recommended protecting this area daily for the next 1 to 2 weeks with a Band-Aid. Follow-up As needed Plan Discharge From [facility] Services: Discharge from [place] Treatment Complete -  keep area covered for protection 1. Discharge from clinic due to closed wound 2. Follow-up as needed Electronic Signature(s) Signed: 09/04/2021 3:56:24 PM By: Kalman Shan DO Entered By: Kalman Shan on 09/04/2021 15:50:35 Jason [name] ([id number]) -------------------------------------------------------------------------------- [place] Details Patient Name: Jason [name] Date of Service: 09/04/2021 Medical Record Number: [id number] Patient Account Number: [id number] Date of Birth/Sex: 05/05/1955 (66 y.o. M) Treating RN: Levora Dredge Primary Care Provider: Viviana Simpler Other Clinician: Referring Provider: Viviana Simpler Treating Provider/Extender: Yaakov Guthrie in Treatment: 8 Diagnosis Coding ICD-10 Codes Code Description L89.003 Pressure ulcer of unspecified elbow, stage 3 M75.102 Unspecified rotator cuff tear or rupture of left shoulder, not specified as traumatic I10 Essential (primary) hypertension Facility Procedures CPT4 Code: [id number] Description: [phone] - WOUND CARE VISIT-LEV 2 EST PT Modifier: Quantity: 1 Physician Procedures CPT4 Code: [id number] Description: [id number] - WC PHYS LEVEL 3 - EST PT Modifier: Quantity: 1 CPT4 Code: Description: ICD-10 Diagnosis Description L89.003 Pressure ulcer of unspecified elbow, stage 3 M75.102 Unspecified rotator cuff tear or rupture of left shoulder, not specified I10 Essential (primary) hypertension Modifier: as traumatic Quantity: Electronic Signature(s) Signed: 09/04/2021 3:51:24 PM By: Carlene Coria RN Signed: 09/04/2021 3:56:24 PM By: Kalman Shan DO Entered By: Carlene Coria on 09/04/2021 15:51:24

## 2021-09-04 NOTE — Progress Notes (Addendum)
ARPAN, ESKELSON ([id number]) Visit Report for 09/04/2021 Arrival Information Details Patient Name: Jason [name], Jason [name]. Date of Service: 09/04/2021 2:45 PM Medical Record Number: [id number] Patient Account Number: [id number] Date of Birth/Sex: May 17, 1955 (65 y.o. M) Treating RN: Levora Dredge Primary Care Layliana Devins: Viviana Simpler Other Clinician: Referring Myrla Malanowski: Viviana Simpler Treating Luismanuel Corman/Extender: Yaakov Guthrie in Treatment: 8 Visit Information History Since Last Visit Added or deleted any medications: No Patient Arrived: Ambulatory Any new allergies or adverse reactions: No Arrival Time: 14:48 Had a fall or experienced change in No Accompanied By: self activities of daily living that may affect Transfer Assistance: None risk of falls: Patient Identification Verified: Yes Hospitalized since last visit: No Secondary Verification Process Completed: Yes Has Dressing in Place as Prescribed: Yes Patient Requires Transmission-Based Precautions: No Pain Present Now: No Patient Has Alerts: No Electronic Signature(s) Signed: 09/04/2021 3:48:03 PM By: Levora Dredge Entered By: Levora Dredge on 09/04/2021 14:48:37 Jason [name] ([id number]) -------------------------------------------------------------------------------- Clinic Level of Care Assessment Details Patient Name: Jason [name] Date of Service: 09/04/2021 2:45 PM Medical Record Number: [id number] Patient Account Number: [id number] Date of Birth/Sex: 07/28/1955 (66 y.o. M) Treating RN: Carlene Coria Primary Care Blakleigh Straw: Viviana Simpler Other Clinician: Referring Adalynne Steffensmeier: Viviana Simpler Treating Lue [place]/Extender: Yaakov Guthrie in Treatment: 8 Clinic Level of Care Assessment Items TOOL 4 Quantity Score X - Use when only an EandM is performed on FOLLOW-UP visit 1 0 ASSESSMENTS - Nursing Assessment / Reassessment X - Reassessment of Co-morbidities (includes updates in patient status)  1 10 X- 1 5 Reassessment of Adherence to Treatment Plan ASSESSMENTS - Wound and Skin Assessment / Reassessment X - Simple Wound Assessment / Reassessment - one wound 1 5 '[]'$  - 0 Complex Wound Assessment / Reassessment - multiple wounds '[]'$  - 0 Dermatologic / Skin Assessment (not related to wound area) ASSESSMENTS - Focused Assessment '[]'$  - Circumferential Edema Measurements - multi extremities 0 '[]'$  - 0 Nutritional Assessment / Counseling / Intervention '[]'$  - 0 Lower Extremity Assessment (monofilament, tuning fork, pulses) '[]'$  - 0 Peripheral Arterial Disease Assessment (using hand held doppler) ASSESSMENTS - Ostomy and/or Continence Assessment and Care '[]'$  - Incontinence Assessment and Management 0 '[]'$  - 0 Ostomy Care Assessment and Management (repouching, etc.) PROCESS - Coordination of Care X - Simple Patient / Family Education for ongoing care 1 15 '[]'$  - 0 Complex (extensive) Patient / Family Education for ongoing care '[]'$  - 0 Staff obtains Programmer, systems, Records, Test Results / Process Orders '[]'$  - 0 Staff telephones HHA, Nursing Homes / Clarify orders / etc '[]'$  - 0 Routine Transfer to another Facility (non-emergent condition) '[]'$  - 0 Routine Hospital Admission (non-emergent condition) '[]'$  - 0 New Admissions / Biomedical engineer / Ordering NPWT, Apligraf, etc. '[]'$  - 0 Emergency Hospital Admission (emergent condition) X- 1 10 Simple Discharge Coordination '[]'$  - 0 Complex (extensive) Discharge Coordination PROCESS - Special Needs '[]'$  - Pediatric / Minor Patient Management 0 '[]'$  - 0 Isolation Patient Management '[]'$  - 0 Hearing / Language / Visual special needs '[]'$  - 0 Assessment of Community assistance (transportation, D/C planning, etc.) '[]'$  - 0 Additional assistance / Altered mentation '[]'$  - 0 Support Surface(s) Assessment (bed, cushion, seat, etc.) INTERVENTIONS - Wound Cleansing / Measurement Jason [name], Jason E. ([id number]) X- 1 5 Simple Wound Cleansing - one wound '[]'$  -  0 Complex Wound Cleansing - multiple wounds '[]'$  - 0 Wound Imaging (photographs - any number of wounds) X- 1 5 Wound Tracing (instead of photographs) X- 1 5 Simple Wound Measurement -  one wound '[]'$  - 0 Complex Wound Measurement - multiple wounds INTERVENTIONS - Wound Dressings '[]'$  - Small Wound Dressing one or multiple wounds 0 '[]'$  - 0 Medium Wound Dressing one or multiple wounds '[]'$  - 0 Large Wound Dressing one or multiple wounds '[]'$  - 0 Application of Medications - topical '[]'$  - 0 Application of Medications - injection INTERVENTIONS - Miscellaneous '[]'$  - External ear exam 0 '[]'$  - 0 Specimen Collection (cultures, biopsies, blood, body fluids, etc.) '[]'$  - 0 Specimen(s) / Culture(s) sent or taken to Lab for analysis '[]'$  - 0 Patient Transfer (multiple staff / Civil Service fast streamer / Similar devices) '[]'$  - 0 Simple Staple / Suture removal (25 or less) '[]'$  - 0 Complex Staple / Suture removal (26 or more) '[]'$  - 0 Hypo / Hyperglycemic Management (close monitor of Blood Glucose) '[]'$  - 0 Ankle / Brachial Index (ABI) - do not check if billed separately X- 1 5 Vital Signs Has the patient been seen at the hospital within the last three years: Yes Total Score: 65 Level Of Care: New/Established - Level 2 Electronic Signature(s) Signed: 09/06/2021 10:45:13 AM By: Carlene Coria RN Entered By: Carlene Coria on 09/04/2021 15:51:15 Jason [name] ([id number]) -------------------------------------------------------------------------------- Encounter Discharge Information Details Patient Name: Jason [name]. Date of Service: 09/04/2021 2:45 PM Medical Record Number: [id number] Patient Account Number: [id number] Date of Birth/Sex: May 08, 1955 (66 y.o. M) Treating RN: Carlene Coria Primary Care Diallo Ponder: Viviana Simpler Other Clinician: Referring Mabrey Howland: Viviana Simpler Treating Chantale Leugers/Extender: Yaakov Guthrie in Treatment: 8 Encounter Discharge Information Items Discharge Condition:  Stable Ambulatory Status: Ambulatory Discharge Destination: Home Transportation: Private Auto Accompanied By: self Schedule Follow-up Appointment: Yes Clinical Summary of Care: Electronic Signature(s) Signed: 09/04/2021 3:54:23 PM By: Carlene Coria RN Entered By: Carlene Coria on 09/04/2021 15:54:23 Jason [name] ([id number]) -------------------------------------------------------------------------------- Lower Extremity Assessment Details Patient Name: Jason [name]. Date of Service: 09/04/2021 2:45 PM Medical Record Number: [id number] Patient Account Number: [id number] Date of Birth/Sex: [birth year] (66 y.o. M) Treating RN: Levora Dredge Primary Care Brinae Woods: Viviana Simpler Other Clinician: Referring Zhavia Cunanan: Viviana Simpler Treating Stephanne Greeley/Extender: Yaakov Guthrie in Treatment: 8 Electronic Signature(s) Signed: 09/04/2021 3:48:03 PM By: Levora Dredge Entered By: Levora Dredge on 09/04/2021 14:54:42 Jason [name] ([id number]) -------------------------------------------------------------------------------- Multi Wound Chart Details Patient Name: Jason [name]. Date of Service: 09/04/2021 2:45 PM Medical Record Number: [id number] Patient Account Number: [id number] Date of Birth/Sex: December 14, 1955 (66 y.o. M) Treating RN: Levora Dredge Primary Care Nyeshia Mysliwiec: Viviana Simpler Other Clinician: Referring Rafan Sanders: Viviana Simpler Treating Tariya Morrissette/Extender: Yaakov Guthrie in Treatment: 8 Vital Signs Height(in): 69 Pulse(bpm): 85 Weight(lbs): 189 Blood Pressure(mmHg): 153/92 Body Mass Index(BMI): 27.9 Temperature(F): 97.8 Respiratory Rate(breaths/min): 18 Photos: [N/A:N/A] Wound Location: Left Elbow N/A N/A Wounding Event: Pressure Injury N/A N/A Primary Etiology: Pressure Ulcer N/A N/A Comorbid History: Hypertension N/A N/A Date Acquired: 06/24/2021 N/A N/A Weeks of Treatment: 8 N/A N/A Wound Status: Open N/A N/A Wound Recurrence: No N/A  N/A Measurements L x W x D (cm) 0x0x0 N/A N/A Area (cm) : 0 N/A N/A Volume (cm) : 0 N/A N/A % Reduction in Area: 100.00% N/A N/A % Reduction in Volume: 100.00% N/A N/A Classification: Category/Stage III N/A N/A Exudate Amount: None Present N/A N/A Granulation Amount: None Present (0%) N/A N/A Necrotic Amount: None Present (0%) N/A N/A Exposed Structures: Fascia: No N/A N/A Fat Layer (Subcutaneous Tissue): No Tendon: No Muscle: No Joint: No Bone: No Epithelialization: None N/A N/A Treatment Notes Electronic Signature(s) Signed: 09/04/2021 3:48:36 PM By: Carlene Coria RN  Previous Signature: 09/04/2021 3:48:03 PM Version By: Levora Dredge Entered By: Carlene Coria on 09/04/2021 15:48:36 Jason [name] ([id number]) -------------------------------------------------------------------------------- [place] Details Patient Name: Jason [name] Date of Service: 09/04/2021 2:45 PM Medical Record Number: [id number] Patient Account Number: [id number] Date of Birth/Sex: 21-May-1955 (66 y.o. M) Treating RN: Levora Dredge Primary Care Nonie Lochner: Viviana Simpler Other Clinician: Referring Taraneh Metheney: Viviana Simpler Treating Brita Jurgensen/Extender: Yaakov Guthrie in Treatment: 8 Active Inactive Electronic Signature(s) Signed: 09/04/2021 3:48:23 PM By: Carlene Coria RN Signed: 09/05/2021 2:50:17 PM By: Levora Dredge Previous Signature: 09/04/2021 3:48:03 PM Version By: Levora Dredge Entered By: Carlene Coria on 09/04/2021 15:48:23 Jason [name] ([id number]) -------------------------------------------------------------------------------- Pain Assessment Details Patient Name: Jason [name]. Date of Service: 09/04/2021 2:45 PM Medical Record Number: [id number] Patient Account Number: [id number] Date of Birth/Sex: [birth year] (66 y.o. M) Treating RN: Levora Dredge Primary Care Maelyn Berrey: Viviana Simpler Other Clinician: Referring Jadin Creque: Viviana Simpler Treating Aleks Nawrot/Extender: Yaakov Guthrie in Treatment: 8 Active Problems Location of Pain Severity and Description of Pain Patient Has Paino No Site Locations Rate the pain. Current Pain Level: 0 Pain Management and Medication Current Pain Management: Electronic Signature(s) Signed: 09/04/2021 3:48:03 PM By: Levora Dredge Entered By: Levora Dredge on 09/04/2021 14:50:30 Jason [name] ([id number]) -------------------------------------------------------------------------------- Patient/Caregiver Education Details Patient Name: Jason [name] Date of Service: 09/04/2021 2:45 PM Medical Record Number: [id number] Patient Account Number: [id number] Date of Birth/Gender: 1955/03/07 (66 y.o. M) Treating RN: Carlene Coria Primary Care Physician: Viviana Simpler Other Clinician: Referring Physician: Viviana Simpler Treating Physician/Extender: Yaakov Guthrie in Treatment: 8 Education Assessment Education Provided To: Patient Education Topics Provided Wound/Skin Impairment: Methods: Explain/Verbal Responses: State content correctly Electronic Signature(s) Signed: 09/06/2021 10:45:13 AM By: Carlene Coria RN Entered By: Carlene Coria on 09/04/2021 15:51:37 Jason [name] ([id number]) -------------------------------------------------------------------------------- Wound Assessment Details Patient Name: Jason [name] Date of Service: 09/04/2021 2:45 PM Medical Record Number: [id number] Patient Account Number: [id number] Date of Birth/Sex: 02/19/1956 (66 y.o. M) Treating RN: Levora Dredge Primary Care Leverne Tessler: Viviana Simpler Other Clinician: Referring Geniyah Eischeid: Viviana Simpler Treating Dior Stepter/Extender: Yaakov Guthrie in Treatment: 8 Wound Status Wound Number: 1 Primary Etiology: Pressure Ulcer Wound Location: Left Elbow Wound Status: Open Wounding Event: Pressure Injury Comorbid History: Hypertension Date Acquired:  06/24/2021 Weeks Of Treatment: 8 Clustered Wound: No Photos Wound Measurements Length: (cm) 0 Width: (cm) 0 Depth: (cm) 0 Area: (cm) 0 Volume: (cm) 0 % Reduction in Area: 100% % Reduction in Volume: 100% Epithelialization: None Tunneling: No Undermining: No Wound Description Classification: Category/Stage III Exudate Amount: None Present Foul Odor After Cleansing: No Slough/Fibrino No Wound Bed Granulation Amount: None Present (0%) Exposed Structure Necrotic Amount: None Present (0%) Fascia Exposed: No Fat Layer (Subcutaneous Tissue) Exposed: No Tendon Exposed: No Muscle Exposed: No Joint Exposed: No Bone Exposed: No Electronic Signature(s) Signed: 09/04/2021 3:48:03 PM By: Levora Dredge Entered By: Levora Dredge on 09/04/2021 15:44:02 Jason [name] ([id number]) -------------------------------------------------------------------------------- Vitals Details Patient Name: Jason [name]. Date of Service: 09/04/2021 2:45 PM Medical Record Number: [id number] Patient Account Number: [id number] Date of Birth/Sex: 1955-06-06 (66 y.o. M) Treating RN: Levora Dredge Primary Care Clotiel Troop: Viviana Simpler Other Clinician: Referring Yaiza Palazzola: Viviana Simpler Treating Thomas Mabry/Extender: Yaakov Guthrie in Treatment: 8 Vital Signs Time Taken: 14:48 Temperature (F): 97.8 Height (in): 69 Pulse (bpm): 85 Weight (lbs): 189 Respiratory Rate (breaths/min): 18 Body Mass Index (BMI): 27.9 Blood Pressure (mmHg): 153/92 Reference Range: 80 - 120 mg / dl Electronic Signature(s) Signed: 09/04/2021 3:48:03 PM By: Levora Dredge Entered  By: Levora Dredge on 09/04/2021 14:50:02

## 2021-09-11 ENCOUNTER — Other Ambulatory Visit: Payer: Self-pay | Admitting: Orthopaedic Surgery

## 2021-09-11 ENCOUNTER — Other Ambulatory Visit (HOSPITAL_COMMUNITY): Payer: Self-pay | Admitting: Orthopaedic Surgery

## 2021-09-11 DIAGNOSIS — M5412 Radiculopathy, cervical region: Secondary | ICD-10-CM

## 2021-09-21 ENCOUNTER — Other Ambulatory Visit: Payer: Self-pay | Admitting: Internal Medicine

## 2021-09-21 DIAGNOSIS — R002 Palpitations: Secondary | ICD-10-CM

## 2021-09-22 ENCOUNTER — Other Ambulatory Visit: Payer: Self-pay | Admitting: Internal Medicine

## 2021-09-26 DIAGNOSIS — M5412 Radiculopathy, cervical region: Secondary | ICD-10-CM | POA: Diagnosis not present

## 2021-10-02 ENCOUNTER — Encounter (HOSPITAL_COMMUNITY): Payer: Self-pay | Admitting: Orthopedic Surgery

## 2021-10-02 ENCOUNTER — Other Ambulatory Visit: Payer: Self-pay

## 2021-10-02 NOTE — Progress Notes (Signed)
Anesthesia Chart Review: Same day workup  Patient recently underwent thorough cardiology evaluation for chest pain as well as palpitations, PSVT, NSVT.  Workup essentially benign.  Cath with no angiographically significant CAD.  Last seen by Dr. Saunders Revel 08/08/2021.  Per note, stable from cardiac state standpoint, no symptomatic ectopy.  No changes made to management.  86-monthfollow-up recommended.   Patient will need day of surgery labs and evaluation.   EKG 03/11/2021: Normal sinus rhythm.  Rate 70. Nonspecific ST abnormality   TTE 07/19/2020:  1. Left ventricular ejection fraction, by estimation, is 50 to 55%. The  left ventricle has low normal function. The left ventricle has no regional  wall motion abnormalities. There is mild left ventricular hypertrophy.  Left ventricular diastolic  parameters are consistent with Grade I diastolic dysfunction (impaired  relaxation). The average left ventricular global longitudinal strain is  -13.2 %. The global longitudinal strain is abnormal.   2. Right ventricular systolic function is low normal. The right  ventricular size is normal.   3. The mitral valve is normal in structure. No evidence of mitral valve  regurgitation.   4. The aortic valve is tricuspid. Aortic valve regurgitation is not  visualized.    Event monitor 04/25/2020: The patient was monitored for 14 days. The predominant rhythm was sinus with an average rate of 85 bpm (range 47 - 122 bpm in sinus). There were rare PAC's and occasional PVC's. There were 6 atrial runs lasting up to 10.8 seconds with a maximum rate of 154 bpm. There were 9 episodes of wide complex tachycardia lasting up to 8 beats with a maximum rate of 231 bpm. Episodes most likely represent NSVT, though some tracings could also reflect SVT with aberrancy. No sustained arrhythmia or prolonged pause was identified. Patient triggered events correspond to sinus rhythm with PVC's.   Sinus rhythm with rare PAC's and  occasional PVC's.  Brief runs of PSVT and NSVT noted, as detailed above.   Cath 03/20/2020: Conclusions: No angiographically significant coronary artery disease. Normal left ventricular contraction with mildly elevated filling pressures consistent with diastolic dysfunction.   Recommendations: Primary prevention of coronary artery disease. Continue blood pressure control as well as recently added metoprolol for management of incidentally noted PVCs.  If symptoms persist, ambulatory cardiac monitoring +/- echcardiography will need to be considered.   JWynonia MustyMSanford Rock Rapids Medical CenterShort Stay Center/Anesthesiology Phone [phone]/10/2021 11:33 AM

## 2021-10-02 NOTE — Progress Notes (Signed)
PCP -Venia Carbon, MD Cardiologist - Nelva Bush, MD  Chest x-ray - 03/11/21 EKG - 08/08/21 ECHO - 07/19/21 Cardiac Cath - 03/20/20  ERAS Protcol - Clears until 0700  Anesthesia review: Y  Patient verbally denies any shortness of breath, fever, cough and chest pain during phone call   -------------  SDW INSTRUCTIONS given:  Your procedure is scheduled on 10/03/21.  Report to [facility] Main Entrance "A" at 0730 A.M., and check in at the Admitting office.  Call this number if you have problems the morning of surgery:  [phone]   Remember:  Do not eat after midnight the night before your surgery  You may drink clear liquids until 0700 the morning of your surgery.   Clear liquids allowed are: Water, Non-Citrus Juices (without pulp), Carbonated Beverages, Clear Tea, Black Coffee Only, and Gatorade    Take these medicines the morning of surgery with A SIP OF WATER  acetaminophen (TYLENOL)  levothyroxine (SYNTHROID) loratadine (CLARITIN)  metoprolol succinate (TOPROL-XL) meloxicam (MOBIC)  rosuvastatin (CRESTOR) tamsulosin (FLOMAX) oxyCODONE-acetaminophen (PERCOCET/ROXICET)-if needed                      Do not wear jewelry, make up, or nail polish            Do not wear lotions, powders, perfumes/colognes, or deodorant.            Do not shave 48 hours prior to surgery.  Men may shave face and neck.            Do not bring valuables to the hospital.            [facility] is not responsible for any belongings or valuables.  Do NOT Smoke (Tobacco/Vaping) 24 hours prior to your procedure   Contacts, glasses, dentures or bridgework may not be worn into surgery.      For patients admitted to the hospital, discharge time will be determined by your treatment team.   Patients discharged the day of surgery will not be allowed to drive home, and someone needs to stay with them for 24 hours.    Special instructions:   [place]- Preparing For Surgery  Before  surgery, you can play an important role. Because skin is not sterile, your skin needs to be as free of germs as possible. You can reduce the number of germs on your skin by washing with CHG (chlorahexidine gluconate) Soap before surgery.  CHG is an antiseptic cleaner which kills germs and bonds with the skin to continue killing germs even after washing.    Oral Hygiene is also important to reduce your risk of infection.  Remember - BRUSH YOUR TEETH THE MORNING OF SURGERY WITH YOUR REGULAR TOOTHPASTE  Please do not use if you have an allergy to CHG or antibacterial soaps. If your skin becomes reddened/irritated stop using the CHG.  Do not shave (including legs and underarms) for at least 48 hours prior to first CHG shower. It is OK to shave your face.  Please follow these instructions carefully.   Shower the NIGHT BEFORE SURGERY and the MORNING OF SURGERY with DIAL Soap.   Pat yourself dry with a CLEAN TOWEL.  Wear CLEAN PAJAMAS to bed the night before surgery  Place CLEAN SHEETS on your bed the night of your first shower and DO NOT SLEEP WITH PETS.   Day of Surgery: Please shower morning of surgery  Wear Clean/Comfortable clothing the morning of surgery Do not apply  any deodorants/lotions.   Remember to brush your teeth WITH YOUR REGULAR TOOTHPASTE.   Questions were answered. Patient verbalized understanding of instructions.

## 2021-10-02 NOTE — Anesthesia Preprocedure Evaluation (Signed)
Anesthesia Evaluation  Patient identified by MRN, date of birth, ID band Patient awake    Reviewed: Allergy & Precautions, NPO status , Patient's Chart, lab work & pertinent test results  Airway Mallampati: II  TM Distance: >3 FB Neck ROM: Full    Dental no notable dental hx. (+) Dental Advisory Given, Teeth Intact   Pulmonary neg pulmonary ROS,    Pulmonary exam normal breath sounds clear to auscultation       Cardiovascular hypertension, Pt. on home beta blockers and Pt. on medications Normal cardiovascular exam Rhythm:Regular Rate:Normal  Echo 06/2020  1. Left ventricular ejection fraction, by estimation, is 50 to 55%. The left ventricle has low normal function. The left ventricle has no regional wall motion abnormalities. There is mild left ventricular hypertrophy. Left ventricular diastolic parameters are consistent with Grade I diastolic dysfunction (impaired relaxation). The average left ventricular global longitudinal strain is -13.2 %. The global longitudinal strain is abnormal.  2. Right ventricular systolic function is low normal. The right ventricular size is normal.  3. The mitral valve is normal in structure. No evidence of mitral valve regurgitation.  4. The aortic valve is tricuspid. Aortic valve regurgitation is not visualized.    LHC 02/2020 1. No angiographically significant coronary artery disease. 2. Normal left ventricular contraction with mildly elevated filling pressures consistent with diastolic dysfunction.  Recommendations: 1. Primary prevention of coronary artery disease. 2. Continue blood pressure control as well as recently added metoprolol for management of incidentally noted PVCs.  If symptoms persist, ambulatory cardiac monitoring +/- echcardiography will need to be considered.     Neuro/Psych negative neurological ROS     GI/Hepatic Neg liver ROS, GERD  ,  Endo/Other  Hypothyroidism    Renal/GU negative Renal ROS     Musculoskeletal  (+) Arthritis ,   Abdominal   Peds  Hematology negative hematology ROS (+)   Anesthesia Other Findings   Reproductive/Obstetrics                                                           Anesthesia Evaluation  Patient identified by MRN, date of birth, ID band Patient awake    Reviewed: Allergy & Precautions, NPO status , Patient's Chart, lab work & pertinent test results  Airway Mallampati: II  TM Distance: >3 FB Neck ROM: Full    Dental no notable dental hx.    Pulmonary neg pulmonary ROS,    Pulmonary exam normal        Cardiovascular hypertension, Pt. on medications and Pt. on home beta blockers  Rhythm:Regular Rate:Normal     Neuro/Psych Anxiety With claustrophobia, unable to remain still with prior imaging attemptsnegative neurological ROS     GI/Hepatic Neg liver ROS, GERD  ,  Endo/Other  Hypothyroidism   Renal/GU negative Renal ROS     Musculoskeletal  (+) Arthritis , Osteoarthritis,    Abdominal Normal abdominal exam  (+)   Peds  Hematology negative hematology ROS (+)   Anesthesia Other Findings   Reproductive/Obstetrics                           Anesthesia Physical Anesthesia Plan  ASA: 2  Anesthesia Plan: General   Post-op Pain Management:    Induction: Intravenous  PONV Risk  Score and Plan: 2 and Ondansetron, Dexamethasone, Midazolam and Treatment may vary due to age or medical condition  Airway Management Planned: Mask and LMA  Additional Equipment: None  Intra-op Plan:   Post-operative Plan: Extubation in OR  Informed Consent: I have reviewed the patients History and Physical, chart, labs and discussed the procedure including the risks, benefits and alternatives for the proposed anesthesia with the patient or authorized representative who has indicated his/her understanding and acceptance.     Dental advisory  given  Plan Discussed with: CRNA  Anesthesia Plan Comments: (PAT note by Karoline Caldwell, PA-C: Patient is undergone thorough cardiology evaluation for chest pain as well as palpitations, PSVT, NSVT last seen by Dr. In 01/24/2021.  Per note, "Symptoms have been quiescent with low-dose metoprolol. Prior cath showed absence of CAD. Echo was without significant structural abnormality. Continue current dose of metoprolol. No further work-up recommended at this time."  7-monthfollow-up recommended.  Patient will need day of surgery labs and evaluation.  EKG 03/11/2021: Normal sinus rhythm.  Rate 70. Nonspecific ST abnormality  TTE 07/19/2020: 1. Left ventricular ejection fraction, by estimation, is 50 to 55%. The  left ventricle has low normal function. The left ventricle has no regional  wall motion abnormalities. There is mild left ventricular hypertrophy.  Left ventricular diastolic  parameters are consistent with Grade I diastolic dysfunction (impaired  relaxation). The average left ventricular global longitudinal strain is  -13.2 %. The global longitudinal strain is abnormal.  2. Right ventricular systolic function is low normal. The right  ventricular size is normal.  3. The mitral valve is normal in structure. No evidence of mitral valve  regurgitation.  4. The aortic valve is tricuspid. Aortic valve regurgitation is not  visualized.   Event monitor 04/25/2020: . The patient was monitored for 14 days. . The predominant rhythm was sinus with an average rate of 85 bpm (range 47 - 122 bpm in sinus). . There were rare PAC's and occasional PVC's. . There were 6 atrial runs lasting up to 10.8 seconds with a maximum rate of 154 bpm. . There were 9 episodes of wide complex tachycardia lasting up to 8 beats with a maximum rate of 231 bpm. Episodes most likely represent NSVT, though some tracings could also reflect SVT with aberrancy. . No sustained arrhythmia or prolonged pause was  identified. . Patient triggered events correspond to sinus rhythm with PVC's.  Sinus rhythm with rare PAC's and occasional PVC's. Brief runs of PSVT and NSVT noted, as detailed above.  Cath 03/20/2020: Conclusions: 1. No angiographically significant coronary artery disease. 2. Normal left ventricular contraction with mildly elevated filling pressures consistent with diastolic dysfunction.  Recommendations: 1. Primary prevention of coronary artery disease. 2. Continue blood pressure control as well as recently added metoprolol for management of incidentally noted PVCs. If symptoms persist, ambulatory cardiac monitoring +/- echcardiography will need to be considered. )      Anesthesia Quick Evaluation  Anesthesia Physical Anesthesia Plan  ASA: 2  Anesthesia Plan: General   Post-op Pain Management: Minimal or no pain anticipated   Induction: Intravenous  PONV Risk Score and Plan: 2 and Dexamethasone, Treatment may vary due to age or medical condition, Ondansetron and Midazolam  Airway Management Planned: LMA  Additional Equipment:   Intra-op Plan:   Post-operative Plan: Extubation in OR  Informed Consent: I have reviewed the patients History and Physical, chart, labs and discussed the procedure including the risks, benefits and alternatives for the proposed anesthesia with  the patient or authorized representative who has indicated his/her understanding and acceptance.     Dental advisory given  Plan Discussed with: CRNA  Anesthesia Plan Comments: (PAT note by Karoline Caldwell, PA-C: Patient recently underwent thorough cardiology evaluationfor chest pain as well as palpitations, PSVT, NSVT.  Workup essentially benign.  Cath with no angiographically significant CAD.  Last seen by Dr. Saunders Revel 08/08/2021.  Per note, stable from cardiac state standpoint, no symptomatic ectopy.  No changes made to management.  60-monthfollow-up recommended.  Patient will need day of surgery labs  and evaluation.  EKG 03/11/2021:Normal sinus rhythm. Rate 70. Nonspecific ST abnormality  TTE 07/19/2020: 1. Left ventricular ejection fraction, by estimation, is 50 to 55%. The  left ventricle has low normal function. The left ventricle has no regional  wall motion abnormalities. There is mild left ventricular hypertrophy.  Left ventricular diastolic  parameters are consistent with Grade I diastolic dysfunction (impaired  relaxation). The average left ventricular global longitudinal strain is  -13.2 %. The global longitudinal strain is abnormal.  2. Right ventricular systolic function is low normal. The right  ventricular size is normal.  3. The mitral valve is normal in structure. No evidence of mitral valve  regurgitation.  4. The aortic valve is tricuspid. Aortic valve regurgitation is not  visualized.   Event monitor 04/25/2020: . The patient was monitored for 14 days. . The predominant rhythm was sinus with an average rate of 85 bpm (range 47 - 122 bpm in sinus). . There were rare PAC's and occasional PVC's. . There were 6 atrial runs lasting up to 10.8 seconds with a maximum rate of 154 bpm. . There were 9 episodes of wide complex tachycardia lasting up to 8 beats with a maximum rate of 231 bpm. Episodes most likely represent NSVT, though some tracings could also reflect SVT with aberrancy. . No sustained arrhythmia or prolonged pause was identified. . Patient triggered events correspond to sinus rhythm with PVC's.  Sinus rhythm with rare PAC's and occasional PVC's. Brief runs of PSVT and NSVT noted, as detailed above.  Cath 03/20/2020: Conclusions: 1. No angiographically significant coronary artery disease. 2. Normal left ventricular contraction with mildly elevated filling pressures consistent with diastolic dysfunction.  Recommendations: 1. Primary prevention of coronary artery disease. 2. Continue blood pressure control as well as recently added metoprolol for  management of incidentally noted PVCs. If symptoms persist, ambulatory cardiac monitoring +/- echcardiography will need to be considered.  )      Anesthesia Quick Evaluation

## 2021-10-03 ENCOUNTER — Ambulatory Visit (HOSPITAL_COMMUNITY): Payer: BC Managed Care – PPO | Admitting: Physician Assistant

## 2021-10-03 ENCOUNTER — Encounter (HOSPITAL_COMMUNITY): Payer: Self-pay

## 2021-10-03 ENCOUNTER — Encounter (HOSPITAL_COMMUNITY): Admission: RE | Disposition: A | Discharge status: Home / Self Care | Payer: Self-pay | Source: Home / Self Care

## 2021-10-03 ENCOUNTER — Other Ambulatory Visit: Payer: Self-pay

## 2021-10-03 ENCOUNTER — Ambulatory Visit (HOSPITAL_COMMUNITY)
Admission: RE | Admit: 2021-10-03 | Discharge: 2021-10-03 | Disposition: A | Discharge status: Home / Self Care | Payer: BC Managed Care – PPO | Attending: Neurosurgery | Admitting: Neurosurgery

## 2021-10-03 ENCOUNTER — Ambulatory Visit (HOSPITAL_COMMUNITY)
Admission: RE | Admit: 2021-10-03 | Discharge: 2021-10-03 | Disposition: A | Discharge status: Home / Self Care | Payer: BC Managed Care – PPO | Source: Ambulatory Visit | Attending: Orthopaedic Surgery | Admitting: Orthopaedic Surgery

## 2021-10-03 DIAGNOSIS — E039 Hypothyroidism, unspecified: Secondary | ICD-10-CM | POA: Insufficient documentation

## 2021-10-03 DIAGNOSIS — M50223 Other cervical disc displacement at C6-C7 level: Secondary | ICD-10-CM | POA: Diagnosis not present

## 2021-10-03 DIAGNOSIS — M5412 Radiculopathy, cervical region: Secondary | ICD-10-CM | POA: Diagnosis not present

## 2021-10-03 DIAGNOSIS — M25512 Pain in left shoulder: Secondary | ICD-10-CM | POA: Diagnosis not present

## 2021-10-03 DIAGNOSIS — I10 Essential (primary) hypertension: Secondary | ICD-10-CM | POA: Insufficient documentation

## 2021-10-03 DIAGNOSIS — Z79899 Other long term (current) drug therapy: Secondary | ICD-10-CM | POA: Insufficient documentation

## 2021-10-03 DIAGNOSIS — K219 Gastro-esophageal reflux disease without esophagitis: Secondary | ICD-10-CM | POA: Diagnosis not present

## 2021-10-03 DIAGNOSIS — M1990 Unspecified osteoarthritis, unspecified site: Secondary | ICD-10-CM | POA: Diagnosis not present

## 2021-10-03 DIAGNOSIS — M4802 Spinal stenosis, cervical region: Secondary | ICD-10-CM | POA: Diagnosis not present

## 2021-10-03 DIAGNOSIS — M50221 Other cervical disc displacement at C4-C5 level: Secondary | ICD-10-CM | POA: Diagnosis not present

## 2021-10-03 HISTORY — DX: Hypothyroidism, unspecified: E03.9

## 2021-10-03 HISTORY — PX: RADIOLOGY WITH ANESTHESIA: SHX6223

## 2021-10-03 SURGERY — MRI WITH ANESTHESIA
Anesthesia: General

## 2021-10-03 MED ORDER — LIDOCAINE 2% (20 MG/ML) 5 ML SYRINGE
INTRAMUSCULAR | Status: DC | PRN
  Administered 2021-10-03: 80 mg via INTRAVENOUS

## 2021-10-03 MED ORDER — LACTATED RINGERS IV SOLN
INTRAVENOUS | Status: DC
Start: 2021-10-03 — End: 2021-10-03

## 2021-10-03 MED ORDER — DEXAMETHASONE SODIUM PHOSPHATE 10 MG/ML IJ SOLN
INTRAMUSCULAR | Status: DC | PRN
  Administered 2021-10-03: 10 mg via INTRAVENOUS

## 2021-10-03 MED ORDER — PHENYLEPHRINE HCL (PRESSORS) 10 MG/ML IV SOLN
INTRAVENOUS | Status: DC | PRN
  Administered 2021-10-03 (×3): 40 ug via INTRAVENOUS

## 2021-10-03 MED ORDER — MIDAZOLAM HCL 2 MG/2ML IJ SOLN
INTRAMUSCULAR | Status: DC | PRN
  Administered 2021-10-03: 2 mg via INTRAVENOUS

## 2021-10-03 MED ORDER — CHLORHEXIDINE GLUCONATE 0.12 % MT SOLN
15.0000 mL | Freq: Once | OROMUCOSAL | Status: AC
  Administered 2021-10-03: 15 mL via OROMUCOSAL
  Filled 2021-10-03: qty 15

## 2021-10-03 MED ORDER — GLYCOPYRROLATE PF 0.2 MG/ML IJ SOSY
PREFILLED_SYRINGE | INTRAMUSCULAR | Status: DC | PRN
  Administered 2021-10-03: .2 mg via INTRAVENOUS

## 2021-10-03 MED ORDER — ONDANSETRON HCL 4 MG/2ML IJ SOLN
INTRAMUSCULAR | Status: DC | PRN
  Administered 2021-10-03: 4 mg via INTRAVENOUS

## 2021-10-03 MED ORDER — PROPOFOL 10 MG/ML IV BOLUS
INTRAVENOUS | Status: DC | PRN
  Administered 2021-10-03: 140 mg via INTRAVENOUS

## 2021-10-03 MED ORDER — ORAL CARE MOUTH RINSE: 15.0000 mL | Freq: Once | OROMUCOSAL | Status: AC

## 2021-10-03 NOTE — Anesthesia Procedure Notes (Signed)
Procedure Name: LMA Insertion Date/Time: 10/03/2021 8:50 AM  Performed by: Betha Loa, CRNAPre-anesthesia Checklist: Patient identified, Emergency Drugs available, Suction available and Patient being monitored Patient Re-evaluated:Patient Re-evaluated prior to induction Oxygen Delivery Method: Circle System Utilized Preoxygenation: Pre-oxygenation with 100% oxygen Induction Type: IV induction Ventilation: Mask ventilation without difficulty LMA: LMA inserted LMA Size: 5.0 Number of attempts: 1 Airway Equipment and Method: Bite block Placement Confirmation: positive ETCO2 Tube secured with: Tape Dental Injury: Teeth and Oropharynx as per pre-operative assessment  Comments: Pt c/o bilateral numbness/tingling to upper extremities, guarding and decrease mobility noted to LUE. Pts head/neck supported with padding/blankets to pts stated comfort prior to induction.  Head/neck remain in position for LMA insertion.

## 2021-10-03 NOTE — Transfer of Care (Signed)
Immediate Anesthesia Transfer of Care Note  Patient: Jason [name]  Procedure(s) Performed: MRI CERVICAL SPINE WITH ANESTHESIA  Patient Location: PACU  Anesthesia Type:General  Level of Consciousness: awake, patient cooperative and responds to stimulation  Airway & Oxygen Therapy: Patient Spontanous Breathing  Post-op Assessment: Report given to RN, Post -op Vital signs reviewed and stable and Patient moving all extremities X 4; pt states numbness/tingling as per preop  Post vital signs: Reviewed and stable  Last Vitals:  Vitals Value Taken Time  BP 106/66 10/03/21 0945  Temp 36.4 C 10/03/21 0945  Pulse 81 10/03/21 0949  Resp 12 10/03/21 0949  SpO2 98 % 10/03/21 0949  Vitals shown include unvalidated device data.  Last Pain:  Vitals:   10/03/21 0945  TempSrc:   PainSc: 0-No pain      Patients Stated Pain Goal: 2 (84/21/03 1281)  Complications: No notable events documented.

## 2021-10-04 ENCOUNTER — Inpatient Hospital Stay
Admission: EM | Admit: 2021-10-04 | Discharge: 2021-10-17 | DRG: 028 | Disposition: A | Discharge status: Rehabilitation Facility | Payer: BC Managed Care – PPO | Source: Ambulatory Visit | Attending: Internal Medicine | Admitting: Internal Medicine

## 2021-10-04 ENCOUNTER — Encounter (HOSPITAL_COMMUNITY): Payer: Self-pay | Admitting: Radiology

## 2021-10-04 DIAGNOSIS — C7940 Secondary malignant neoplasm of unspecified part of nervous system: Secondary | ICD-10-CM | POA: Diagnosis not present

## 2021-10-04 DIAGNOSIS — I9581 Postprocedural hypotension: Secondary | ICD-10-CM | POA: Diagnosis not present

## 2021-10-04 DIAGNOSIS — N319 Neuromuscular dysfunction of bladder, unspecified: Secondary | ICD-10-CM | POA: Diagnosis present

## 2021-10-04 DIAGNOSIS — E039 Hypothyroidism, unspecified: Secondary | ICD-10-CM | POA: Diagnosis present

## 2021-10-04 DIAGNOSIS — G8929 Other chronic pain: Secondary | ICD-10-CM | POA: Diagnosis present

## 2021-10-04 DIAGNOSIS — F39 Unspecified mood [affective] disorder: Secondary | ICD-10-CM | POA: Diagnosis present

## 2021-10-04 DIAGNOSIS — C770 Secondary and unspecified malignant neoplasm of lymph nodes of head, face and neck: Secondary | ICD-10-CM | POA: Diagnosis not present

## 2021-10-04 DIAGNOSIS — I10 Essential (primary) hypertension: Secondary | ICD-10-CM | POA: Diagnosis not present

## 2021-10-04 DIAGNOSIS — R9389 Abnormal findings on diagnostic imaging of other specified body structures: Secondary | ICD-10-CM | POA: Diagnosis not present

## 2021-10-04 DIAGNOSIS — C7949 Secondary malignant neoplasm of other parts of nervous system: Secondary | ICD-10-CM | POA: Diagnosis not present

## 2021-10-04 DIAGNOSIS — R590 Localized enlarged lymph nodes: Secondary | ICD-10-CM | POA: Diagnosis present

## 2021-10-04 DIAGNOSIS — I63521 Cerebral infarction due to unspecified occlusion or stenosis of right anterior cerebral artery: Secondary | ICD-10-CM | POA: Diagnosis not present

## 2021-10-04 DIAGNOSIS — M1990 Unspecified osteoarthritis, unspecified site: Secondary | ICD-10-CM | POA: Diagnosis present

## 2021-10-04 DIAGNOSIS — C775 Secondary and unspecified malignant neoplasm of intrapelvic lymph nodes: Secondary | ICD-10-CM | POA: Diagnosis not present

## 2021-10-04 DIAGNOSIS — I639 Cerebral infarction, unspecified: Secondary | ICD-10-CM | POA: Diagnosis not present

## 2021-10-04 DIAGNOSIS — M50221 Other cervical disc displacement at C4-C5 level: Secondary | ICD-10-CM | POA: Diagnosis not present

## 2021-10-04 DIAGNOSIS — C786 Secondary malignant neoplasm of retroperitoneum and peritoneum: Secondary | ICD-10-CM | POA: Diagnosis present

## 2021-10-04 DIAGNOSIS — K5730 Diverticulosis of large intestine without perforation or abscess without bleeding: Secondary | ICD-10-CM | POA: Diagnosis not present

## 2021-10-04 DIAGNOSIS — D509 Iron deficiency anemia, unspecified: Secondary | ICD-10-CM | POA: Diagnosis not present

## 2021-10-04 DIAGNOSIS — R7401 Elevation of levels of liver transaminase levels: Secondary | ICD-10-CM | POA: Diagnosis not present

## 2021-10-04 DIAGNOSIS — R410 Disorientation, unspecified: Secondary | ICD-10-CM | POA: Diagnosis not present

## 2021-10-04 DIAGNOSIS — Z7189 Other specified counseling: Secondary | ICD-10-CM

## 2021-10-04 DIAGNOSIS — C61 Malignant neoplasm of prostate: Secondary | ICD-10-CM | POA: Diagnosis not present

## 2021-10-04 DIAGNOSIS — T380X5A Adverse effect of glucocorticoids and synthetic analogues, initial encounter: Secondary | ICD-10-CM | POA: Diagnosis not present

## 2021-10-04 DIAGNOSIS — Z96643 Presence of artificial hip joint, bilateral: Secondary | ICD-10-CM | POA: Diagnosis present

## 2021-10-04 DIAGNOSIS — M40204 Unspecified kyphosis, thoracic region: Secondary | ICD-10-CM | POA: Diagnosis not present

## 2021-10-04 DIAGNOSIS — E7800 Pure hypercholesterolemia, unspecified: Secondary | ICD-10-CM | POA: Diagnosis not present

## 2021-10-04 DIAGNOSIS — Z981 Arthrodesis status: Secondary | ICD-10-CM | POA: Diagnosis not present

## 2021-10-04 DIAGNOSIS — F40240 Claustrophobia: Secondary | ICD-10-CM | POA: Diagnosis present

## 2021-10-04 DIAGNOSIS — G9520 Unspecified cord compression: Secondary | ICD-10-CM | POA: Diagnosis not present

## 2021-10-04 DIAGNOSIS — M4802 Spinal stenosis, cervical region: Secondary | ICD-10-CM | POA: Diagnosis present

## 2021-10-04 DIAGNOSIS — Z515 Encounter for palliative care: Secondary | ICD-10-CM | POA: Diagnosis not present

## 2021-10-04 DIAGNOSIS — C799 Secondary malignant neoplasm of unspecified site: Secondary | ICD-10-CM | POA: Diagnosis not present

## 2021-10-04 DIAGNOSIS — G8254 Quadriplegia, C5-C7 incomplete: Secondary | ICD-10-CM | POA: Diagnosis not present

## 2021-10-04 DIAGNOSIS — E871 Hypo-osmolality and hyponatremia: Secondary | ICD-10-CM | POA: Diagnosis present

## 2021-10-04 DIAGNOSIS — F419 Anxiety disorder, unspecified: Secondary | ICD-10-CM | POA: Diagnosis not present

## 2021-10-04 DIAGNOSIS — I493 Ventricular premature depolarization: Secondary | ICD-10-CM | POA: Diagnosis present

## 2021-10-04 DIAGNOSIS — D539 Nutritional anemia, unspecified: Secondary | ICD-10-CM

## 2021-10-04 DIAGNOSIS — I472 Ventricular tachycardia, unspecified: Secondary | ICD-10-CM | POA: Diagnosis present

## 2021-10-04 DIAGNOSIS — M5412 Radiculopathy, cervical region: Secondary | ICD-10-CM | POA: Diagnosis not present

## 2021-10-04 DIAGNOSIS — I160 Hypertensive urgency: Secondary | ICD-10-CM

## 2021-10-04 DIAGNOSIS — R509 Fever, unspecified: Secondary | ICD-10-CM | POA: Diagnosis not present

## 2021-10-04 DIAGNOSIS — N179 Acute kidney failure, unspecified: Secondary | ICD-10-CM | POA: Diagnosis not present

## 2021-10-04 DIAGNOSIS — J9601 Acute respiratory failure with hypoxia: Secondary | ICD-10-CM | POA: Diagnosis not present

## 2021-10-04 DIAGNOSIS — C7951 Secondary malignant neoplasm of bone: Secondary | ICD-10-CM | POA: Diagnosis present

## 2021-10-04 DIAGNOSIS — D492 Neoplasm of unspecified behavior of bone, soft tissue, and skin: Secondary | ICD-10-CM | POA: Diagnosis not present

## 2021-10-04 DIAGNOSIS — M62838 Other muscle spasm: Secondary | ICD-10-CM | POA: Diagnosis not present

## 2021-10-04 DIAGNOSIS — K219 Gastro-esophageal reflux disease without esophagitis: Secondary | ICD-10-CM | POA: Diagnosis present

## 2021-10-04 DIAGNOSIS — M47812 Spondylosis without myelopathy or radiculopathy, cervical region: Secondary | ICD-10-CM | POA: Diagnosis not present

## 2021-10-04 DIAGNOSIS — M545 Low back pain, unspecified: Secondary | ICD-10-CM | POA: Diagnosis not present

## 2021-10-04 DIAGNOSIS — A419 Sepsis, unspecified organism: Secondary | ICD-10-CM | POA: Diagnosis not present

## 2021-10-04 DIAGNOSIS — J9811 Atelectasis: Secondary | ICD-10-CM | POA: Diagnosis not present

## 2021-10-04 DIAGNOSIS — D649 Anemia, unspecified: Secondary | ICD-10-CM

## 2021-10-04 DIAGNOSIS — R633 Feeding difficulties, unspecified: Secondary | ICD-10-CM | POA: Diagnosis present

## 2021-10-04 DIAGNOSIS — R972 Elevated prostate specific antigen [PSA]: Secondary | ICD-10-CM

## 2021-10-04 DIAGNOSIS — Z823 Family history of stroke: Secondary | ICD-10-CM

## 2021-10-04 DIAGNOSIS — J95821 Acute postprocedural respiratory failure: Secondary | ICD-10-CM | POA: Diagnosis not present

## 2021-10-04 DIAGNOSIS — R0600 Dyspnea, unspecified: Secondary | ICD-10-CM | POA: Diagnosis not present

## 2021-10-04 DIAGNOSIS — R008 Other abnormalities of heart beat: Secondary | ICD-10-CM | POA: Diagnosis not present

## 2021-10-04 DIAGNOSIS — Z96612 Presence of left artificial shoulder joint: Secondary | ICD-10-CM | POA: Diagnosis present

## 2021-10-04 DIAGNOSIS — R578 Other shock: Secondary | ICD-10-CM | POA: Diagnosis not present

## 2021-10-04 DIAGNOSIS — R29898 Other symptoms and signs involving the musculoskeletal system: Secondary | ICD-10-CM | POA: Diagnosis not present

## 2021-10-04 DIAGNOSIS — C801 Malignant (primary) neoplasm, unspecified: Secondary | ICD-10-CM | POA: Diagnosis not present

## 2021-10-04 DIAGNOSIS — J9602 Acute respiratory failure with hypercapnia: Secondary | ICD-10-CM | POA: Diagnosis present

## 2021-10-04 DIAGNOSIS — Z791 Long term (current) use of non-steroidal anti-inflammatories (NSAID): Secondary | ICD-10-CM

## 2021-10-04 DIAGNOSIS — D7389 Other diseases of spleen: Secondary | ICD-10-CM | POA: Diagnosis not present

## 2021-10-04 DIAGNOSIS — J9690 Respiratory failure, unspecified, unspecified whether with hypoxia or hypercapnia: Secondary | ICD-10-CM | POA: Diagnosis not present

## 2021-10-04 DIAGNOSIS — Z79890 Hormone replacement therapy (postmenopausal): Secondary | ICD-10-CM

## 2021-10-04 DIAGNOSIS — R002 Palpitations: Secondary | ICD-10-CM

## 2021-10-04 DIAGNOSIS — Z8249 Family history of ischemic heart disease and other diseases of the circulatory system: Secondary | ICD-10-CM

## 2021-10-04 DIAGNOSIS — E785 Hyperlipidemia, unspecified: Secondary | ICD-10-CM | POA: Diagnosis present

## 2021-10-04 DIAGNOSIS — J690 Pneumonitis due to inhalation of food and vomit: Secondary | ICD-10-CM | POA: Diagnosis not present

## 2021-10-04 DIAGNOSIS — K5900 Constipation, unspecified: Secondary | ICD-10-CM | POA: Diagnosis not present

## 2021-10-04 DIAGNOSIS — N400 Enlarged prostate without lower urinary tract symptoms: Secondary | ICD-10-CM

## 2021-10-04 DIAGNOSIS — I959 Hypotension, unspecified: Secondary | ICD-10-CM

## 2021-10-04 DIAGNOSIS — I6523 Occlusion and stenosis of bilateral carotid arteries: Secondary | ICD-10-CM | POA: Diagnosis not present

## 2021-10-04 DIAGNOSIS — I700 Atherosclerosis of aorta: Secondary | ICD-10-CM | POA: Diagnosis not present

## 2021-10-04 DIAGNOSIS — Z79899 Other long term (current) drug therapy: Secondary | ICD-10-CM

## 2021-10-04 DIAGNOSIS — R739 Hyperglycemia, unspecified: Secondary | ICD-10-CM | POA: Diagnosis not present

## 2021-10-04 DIAGNOSIS — M4804 Spinal stenosis, thoracic region: Secondary | ICD-10-CM | POA: Diagnosis present

## 2021-10-04 DIAGNOSIS — Z4682 Encounter for fitting and adjustment of non-vascular catheter: Secondary | ICD-10-CM | POA: Diagnosis not present

## 2021-10-04 HISTORY — DX: Calculus of kidney: N20.0

## 2021-10-04 NOTE — Anesthesia Postprocedure Evaluation (Signed)
Anesthesia Post Note  Patient: Jason [name]  Procedure(s) Performed: MRI CERVICAL SPINE WITH ANESTHESIA     Patient location during evaluation: PACU Anesthesia Type: General Level of consciousness: sedated and patient cooperative Pain management: pain level controlled Vital Signs Assessment: post-procedure vital signs reviewed and stable Respiratory status: spontaneous breathing Cardiovascular status: stable Anesthetic complications: no   No notable events documented.  Last Vitals:  Vitals:   10/03/21 1000 10/03/21 1012  BP: 124/78 129/78  Pulse: 82 82  Resp: 11 11  Temp:  36.7 C  SpO2: 97% 98%    Last Pain:  Vitals:   10/03/21 0945  TempSrc:   PainSc: 0-No pain                 Nolon Nations

## 2021-10-04 NOTE — ED Provider Notes (Signed)
[facility] Provider Note    Event Date/Time   First MD Initiated Contact with Patient 10/04/21 2323     (approximate)   History   Neck Pain   HPI  Jason [name] is a 66 y.o. male with past medical history of GERD, hypothyroidism, HTN and some chronic left upper extremity weakness and decree sensation in his first and second digits as well as chronic neck pain has been present since at least December as well as some intermittent paresthesias in his right first and second digits who presents after referred to emergency room by [facility] following appointment today that was following up an MRI of C-spine he had yesterday.  Patient states he was told he might have cancer in his neck.  He denies any new weakness or numbness but states he has some chronic weakness in his left arm and cannot raise it past 90 degrees.  All of the symptoms have been present since at least December.  He has no lower extremity symptoms or any new other upper extremity symptoms of headache earache or sore throat.  He has felt constipated but has been taking opioids.  No chest pain, cough, fevers, abdominal pain, vomiting, nausea, rash or any other acute sick symptoms.  He denies any tobacco abuse or history of known cancer.    Past Medical History:  Diagnosis Date   Allergy    Arthritis    osteoarthritis   GERD (gastroesophageal reflux disease)    Hypertension    Hypothyroidism    Thyroid disease    Hyperthyroidism s/p radioactive iodine ablation     Physical Exam  Triage Vital Signs: ED Triage Vitals [10/04/21 1656]  Enc Vitals Group     BP (!) 178/103     Pulse Rate 95     Resp 14     Temp 98.4 F (36.9 C)     Temp Source Oral     SpO2 97 %     Weight 187 lb 6.3 oz (85 kg)     Height '5\' 9"'$  (1.753 m)     Head Circumference      Peak Flow      Pain Score 3     Pain Loc      Pain Edu?      Excl. in [place]?     Most recent vital signs: Vitals:   10/04/21 1656  BP:  (!) 178/103  Pulse: 95  Resp: 14  Temp: 98.4 F (36.9 C)  SpO2: 97%    General: Awake, no distress.  CV:  Good peripheral perfusion.  2+ radial pulse. Resp:  Normal effort.  Abd:  No distention.  Other:  Patient has symmetric grip strength in the bilateral upper extremities but is unable to abduct the left shoulder.  Sensation is intact in the distribution of radial ulnar and median nerves but is slightly decreased to light touch in the first and second digits in the left upper extremity.  Sensation seems to be intact throughout the right upper extremity.  He has some mild tenderness over the bilateral trapezius and scapular areas but no significant midline tenderness.   ED Results / Procedures / Treatments  Labs (all labs ordered are listed, but only abnormal results are displayed) Labs Reviewed - No data to display   EKG    RADIOLOGY   MRI obtained yesterday on my review without evidence of an acute fracture or significant traumatic listhesis.  I reviewed radiology's interpretation and agree with  their findings of some marrow abnormal signaling and diffuse involvement at the C4 into thoracic spine as well as epidural disease greatest at C6 where there is some canal stenosis without cord compression and a left greater than right foraminal effacement due to extraosseous disease.  Per radiology this may reflect metastatic disease or marrow infiltrative neoplastic process.   PROCEDURES:  Critical Care performed: No  Procedures {If the patient is on the monitor, remove the brackets and asterisks. Otherwise delete the sentence below:1} {**The patient is on the cardiac monitor to evaluate for evidence of arrhythmia and/or significant heart rate changes.**}   MEDICATIONS ORDERED IN ED: Medications - No data to display   IMPRESSION / MDM / [place] / ED COURSE  I reviewed the triage vital signs and the nursing notes. Patient's presentation is most consistent with {EM  COPA:27473}                               Differential diagnosis includes, but is not limited to subacute to chronic weakness and paresthesias related to radiculopathy, metastatic disease versus other infiltrative process on MRI.  Patient reports he was sent to the ER for a sedated MRI.  MRI obtained yesterday on my review without evidence of an acute fracture or significant traumatic listhesis.  I reviewed radiology's interpretation and agree with their findings of some marrow abnormal signaling and diffuse involvement at the C4 into thoracic spine as well as epidural disease greatest at C6 where there is some canal stenosis without cord compression and a left greater than right foraminal effacement due to extraosseous disease.  Per radiology this may reflect metastatic disease or marrow infiltrative neoplastic process.      FINAL CLINICAL IMPRESSION(S) / ED DIAGNOSES   Final diagnoses:  None     Rx / DC Orders   ED Discharge Orders     None        Note:  This document was prepared using Dragon voice recognition software and may include unintentional dictation errors.

## 2021-10-04 NOTE — ED Triage Notes (Signed)
Pt ambulatory with reports that he has been having neck and back pain for over a year. Pt reports that he has been following up with [facility] and had a CT recently and determined that the patient needs MRI. Pt is claustrophobic so was sent to ED for sedated MRI.

## 2021-10-04 NOTE — ED Provider Triage Note (Signed)
Emergency Medicine Provider Triage Evaluation Note  Jason [name] , a 66 y.o. male  was evaluated in triage.  Pt complains of neck and back pain for the past year. MR cervical spine today showed some concern of compression and maybe cancer. He was sent to the ER for MR with and without contrast .  Physical Exam  There were no vitals taken for this visit. Gen:   Awake, no distress   Resp:  Normal effort  MSK:   Moves extremities without difficulty  Other:    Medical Decision Making  Medically screening exam initiated at 4:54 PM.  Appropriate orders placed.  Jason [name] was informed that the remainder of the evaluation will be completed by another provider, this initial triage assessment does not replace that evaluation, and the importance of remaining in the ED until their evaluation is complete.   Victorino Dike, FNP 10/04/21 1730

## 2021-10-05 ENCOUNTER — Emergency Department: Payer: BC Managed Care – PPO

## 2021-10-05 ENCOUNTER — Inpatient Hospital Stay: Payer: BC Managed Care – PPO

## 2021-10-05 DIAGNOSIS — A419 Sepsis, unspecified organism: Secondary | ICD-10-CM | POA: Diagnosis not present

## 2021-10-05 DIAGNOSIS — C7940 Secondary malignant neoplasm of unspecified part of nervous system: Secondary | ICD-10-CM | POA: Diagnosis present

## 2021-10-05 DIAGNOSIS — K592 Neurogenic bowel, not elsewhere classified: Secondary | ICD-10-CM | POA: Diagnosis not present

## 2021-10-05 DIAGNOSIS — R578 Other shock: Secondary | ICD-10-CM | POA: Diagnosis not present

## 2021-10-05 DIAGNOSIS — Z515 Encounter for palliative care: Secondary | ICD-10-CM | POA: Diagnosis not present

## 2021-10-05 DIAGNOSIS — G8929 Other chronic pain: Secondary | ICD-10-CM | POA: Diagnosis present

## 2021-10-05 DIAGNOSIS — M4802 Spinal stenosis, cervical region: Secondary | ICD-10-CM | POA: Diagnosis not present

## 2021-10-05 DIAGNOSIS — C7951 Secondary malignant neoplasm of bone: Secondary | ICD-10-CM | POA: Diagnosis present

## 2021-10-05 DIAGNOSIS — I639 Cerebral infarction, unspecified: Secondary | ICD-10-CM | POA: Diagnosis not present

## 2021-10-05 DIAGNOSIS — C799 Secondary malignant neoplasm of unspecified site: Secondary | ICD-10-CM

## 2021-10-05 DIAGNOSIS — N179 Acute kidney failure, unspecified: Secondary | ICD-10-CM | POA: Diagnosis not present

## 2021-10-05 DIAGNOSIS — R7401 Elevation of levels of liver transaminase levels: Secondary | ICD-10-CM | POA: Diagnosis not present

## 2021-10-05 DIAGNOSIS — D649 Anemia, unspecified: Secondary | ICD-10-CM | POA: Diagnosis not present

## 2021-10-05 DIAGNOSIS — Z7189 Other specified counseling: Secondary | ICD-10-CM | POA: Diagnosis not present

## 2021-10-05 DIAGNOSIS — D509 Iron deficiency anemia, unspecified: Secondary | ICD-10-CM | POA: Diagnosis present

## 2021-10-05 DIAGNOSIS — G8250 Quadriplegia, unspecified: Secondary | ICD-10-CM | POA: Diagnosis not present

## 2021-10-05 DIAGNOSIS — I10 Essential (primary) hypertension: Secondary | ICD-10-CM

## 2021-10-05 DIAGNOSIS — J9601 Acute respiratory failure with hypoxia: Secondary | ICD-10-CM | POA: Diagnosis not present

## 2021-10-05 DIAGNOSIS — D62 Acute posthemorrhagic anemia: Secondary | ICD-10-CM | POA: Diagnosis not present

## 2021-10-05 DIAGNOSIS — C7949 Secondary malignant neoplasm of other parts of nervous system: Secondary | ICD-10-CM | POA: Diagnosis present

## 2021-10-05 DIAGNOSIS — R9389 Abnormal findings on diagnostic imaging of other specified body structures: Secondary | ICD-10-CM | POA: Diagnosis not present

## 2021-10-05 DIAGNOSIS — R29898 Other symptoms and signs involving the musculoskeletal system: Secondary | ICD-10-CM | POA: Diagnosis not present

## 2021-10-05 DIAGNOSIS — C786 Secondary malignant neoplasm of retroperitoneum and peritoneum: Secondary | ICD-10-CM | POA: Diagnosis present

## 2021-10-05 DIAGNOSIS — J95821 Acute postprocedural respiratory failure: Secondary | ICD-10-CM | POA: Diagnosis not present

## 2021-10-05 DIAGNOSIS — R252 Cramp and spasm: Secondary | ICD-10-CM | POA: Diagnosis not present

## 2021-10-05 DIAGNOSIS — I160 Hypertensive urgency: Secondary | ICD-10-CM | POA: Diagnosis present

## 2021-10-05 DIAGNOSIS — I959 Hypotension, unspecified: Secondary | ICD-10-CM | POA: Diagnosis not present

## 2021-10-05 DIAGNOSIS — D72829 Elevated white blood cell count, unspecified: Secondary | ICD-10-CM | POA: Diagnosis not present

## 2021-10-05 DIAGNOSIS — F40240 Claustrophobia: Secondary | ICD-10-CM | POA: Diagnosis present

## 2021-10-05 DIAGNOSIS — J690 Pneumonitis due to inhalation of food and vomit: Secondary | ICD-10-CM | POA: Diagnosis not present

## 2021-10-05 DIAGNOSIS — I63521 Cerebral infarction due to unspecified occlusion or stenosis of right anterior cerebral artery: Secondary | ICD-10-CM | POA: Diagnosis not present

## 2021-10-05 DIAGNOSIS — E785 Hyperlipidemia, unspecified: Secondary | ICD-10-CM | POA: Diagnosis present

## 2021-10-05 DIAGNOSIS — R972 Elevated prostate specific antigen [PSA]: Secondary | ICD-10-CM | POA: Diagnosis not present

## 2021-10-05 DIAGNOSIS — C61 Malignant neoplasm of prostate: Secondary | ICD-10-CM | POA: Diagnosis present

## 2021-10-05 DIAGNOSIS — E039 Hypothyroidism, unspecified: Secondary | ICD-10-CM | POA: Diagnosis present

## 2021-10-05 DIAGNOSIS — F419 Anxiety disorder, unspecified: Secondary | ICD-10-CM | POA: Diagnosis not present

## 2021-10-05 DIAGNOSIS — C775 Secondary and unspecified malignant neoplasm of intrapelvic lymph nodes: Secondary | ICD-10-CM | POA: Diagnosis present

## 2021-10-05 DIAGNOSIS — G8254 Quadriplegia, C5-C7 incomplete: Secondary | ICD-10-CM | POA: Diagnosis present

## 2021-10-05 DIAGNOSIS — K219 Gastro-esophageal reflux disease without esophagitis: Secondary | ICD-10-CM | POA: Diagnosis present

## 2021-10-05 DIAGNOSIS — I824Z2 Acute embolism and thrombosis of unspecified deep veins of left distal lower extremity: Secondary | ICD-10-CM | POA: Diagnosis not present

## 2021-10-05 DIAGNOSIS — J9811 Atelectasis: Secondary | ICD-10-CM | POA: Diagnosis not present

## 2021-10-05 DIAGNOSIS — N319 Neuromuscular dysfunction of bladder, unspecified: Secondary | ICD-10-CM | POA: Diagnosis not present

## 2021-10-05 DIAGNOSIS — I951 Orthostatic hypotension: Secondary | ICD-10-CM | POA: Diagnosis not present

## 2021-10-05 DIAGNOSIS — I472 Ventricular tachycardia, unspecified: Secondary | ICD-10-CM | POA: Diagnosis present

## 2021-10-05 DIAGNOSIS — M7989 Other specified soft tissue disorders: Secondary | ICD-10-CM | POA: Diagnosis not present

## 2021-10-05 DIAGNOSIS — M1990 Unspecified osteoarthritis, unspecified site: Secondary | ICD-10-CM | POA: Diagnosis present

## 2021-10-05 LAB — DIFFERENTIAL
Abs Immature Granulocytes: 0.09 10*3/uL — ABNORMAL HIGH (ref 0.00–0.07)
Basophils Absolute: 0.1 10*3/uL (ref 0.0–0.1)
Basophils Relative: 1 %
Eosinophils Absolute: 0.1 10*3/uL (ref 0.0–0.5)
Eosinophils Relative: 1 %
Immature Granulocytes: 1 %
Lymphocytes Relative: 17 %
Lymphs Abs: 1.7 10*3/uL (ref 0.7–4.0)
Monocytes Absolute: 0.8 10*3/uL (ref 0.1–1.0)
Monocytes Relative: 8 %
Neutro Abs: 7.5 10*3/uL (ref 1.7–7.7)
Neutrophils Relative %: 72 %

## 2021-10-05 LAB — URINALYSIS, COMPLETE (UACMP) WITH MICROSCOPIC
Bacteria, UA: NONE SEEN
Bilirubin Urine: NEGATIVE
Glucose, UA: NEGATIVE mg/dL
Ketones, ur: NEGATIVE mg/dL
Leukocytes,Ua: NEGATIVE
Nitrite: NEGATIVE
Protein, ur: NEGATIVE mg/dL
Specific Gravity, Urine: 1.008 (ref 1.005–1.030)
Squamous Epithelial / HPF: NONE SEEN (ref 0–5)
WBC, UA: NONE SEEN WBC/hpf (ref 0–5)
pH: 6 (ref 5.0–8.0)

## 2021-10-05 LAB — HIV ANTIBODY (ROUTINE TESTING W REFLEX): HIV Screen 4th Generation wRfx: NONREACTIVE

## 2021-10-05 LAB — CBC
HCT: 36.6 % — ABNORMAL LOW (ref 39.0–52.0)
HCT: 37.2 % — ABNORMAL LOW (ref 39.0–52.0)
Hemoglobin: 12.3 g/dL — ABNORMAL LOW (ref 13.0–17.0)
Hemoglobin: 12.5 g/dL — ABNORMAL LOW (ref 13.0–17.0)
MCH: 31 pg (ref 26.0–34.0)
MCH: 30.3 pg (ref 26.0–34.0)
MCHC: 33.6 g/dL (ref 30.0–36.0)
MCHC: 33.6 g/dL (ref 30.0–36.0)
MCV: 92.2 fL (ref 80.0–100.0)
MCV: 90.1 fL (ref 80.0–100.0)
Platelets: 153 10*3/uL (ref 150–400)
Platelets: 158 10*3/uL (ref 150–400)
RBC: 3.97 MIL/uL — ABNORMAL LOW (ref 4.22–5.81)
RBC: 4.13 MIL/uL — ABNORMAL LOW (ref 4.22–5.81)
RDW: 13.8 % (ref 11.5–15.5)
RDW: 13.9 % (ref 11.5–15.5)
WBC: 11.7 10*3/uL — ABNORMAL HIGH (ref 4.0–10.5)
WBC: 10.4 10*3/uL (ref 4.0–10.5)
nRBC: 0 % (ref 0.0–0.2)
nRBC: 0 % (ref 0.0–0.2)

## 2021-10-05 LAB — BASIC METABOLIC PANEL
Anion gap: 9 (ref 5–15)
Anion gap: 8 (ref 5–15)
BUN: 23 mg/dL (ref 8–23)
BUN: 22 mg/dL (ref 8–23)
CO2: 25 mmol/L (ref 22–32)
CO2: 26 mmol/L (ref 22–32)
Calcium: 9.3 mg/dL (ref 8.9–10.3)
Calcium: 9.1 mg/dL (ref 8.9–10.3)
Chloride: 101 mmol/L (ref 98–111)
Chloride: 101 mmol/L (ref 98–111)
Creatinine, Ser: 0.75 mg/dL (ref 0.61–1.24)
Creatinine, Ser: 0.79 mg/dL (ref 0.61–1.24)
GFR, Estimated: >60 mL/min (ref >60)
GFR, Estimated: >60 mL/min (ref >60)
Glucose, Bld: 101 mg/dL — ABNORMAL HIGH (ref 70–99)
Glucose, Bld: 117 mg/dL — ABNORMAL HIGH (ref 70–99)
Potassium: 3.6 mmol/L (ref 3.5–5.1)
Potassium: 3.5 mmol/L (ref 3.5–5.1)
Sodium: 135 mmol/L (ref 135–145)
Sodium: 135 mmol/L (ref 135–145)

## 2021-10-05 LAB — TECHNOLOGIST SMEAR REVIEW: Plt Morphology: NORMAL

## 2021-10-05 LAB — CBC WITH DIFFERENTIAL/PLATELET
Abs Immature Granulocytes: 0.12 10*3/uL — ABNORMAL HIGH (ref 0.00–0.07)
Basophils Absolute: 0.1 10*3/uL (ref 0.0–0.1)
Basophils Relative: 1 %
Eosinophils Absolute: 0.1 10*3/uL (ref 0.0–0.5)
Eosinophils Relative: 1 %
HCT: 38.7 % — ABNORMAL LOW (ref 39.0–52.0)
Hemoglobin: 13 g/dL (ref 13.0–17.0)
Immature Granulocytes: 1 %
Lymphocytes Relative: 26 %
Lymphs Abs: 3.3 10*3/uL (ref 0.7–4.0)
MCH: 30.6 pg (ref 26.0–34.0)
MCHC: 33.6 g/dL (ref 30.0–36.0)
MCV: 91.1 fL (ref 80.0–100.0)
Monocytes Absolute: 0.8 10*3/uL (ref 0.1–1.0)
Monocytes Relative: 7 %
Neutro Abs: 8.3 10*3/uL — ABNORMAL HIGH (ref 1.7–7.7)
Neutrophils Relative %: 64 %
Platelets: 158 10*3/uL (ref 150–400)
RBC: 4.25 MIL/uL (ref 4.22–5.81)
RDW: 13.7 % (ref 11.5–15.5)
WBC: 12.7 10*3/uL — ABNORMAL HIGH (ref 4.0–10.5)
nRBC: 0 % (ref 0.0–0.2)

## 2021-10-05 LAB — PSA: Prostatic Specific Antigen: >1500 ng/mL — ABNORMAL HIGH (ref 0.00–4.00)

## 2021-10-05 LAB — URIC ACID: Uric Acid, Serum: 8 mg/dL (ref 3.7–8.6)

## 2021-10-05 LAB — LACTATE DEHYDROGENASE: LDH: 431 U/L — ABNORMAL HIGH (ref 98–192)

## 2021-10-05 MED ORDER — OXYCODONE-ACETAMINOPHEN 5-325 MG PO TABS: 1.0000 | ORAL_TABLET | ORAL | Status: DC | PRN

## 2021-10-05 MED ORDER — MAGNESIUM HYDROXIDE 400 MG/5ML PO SUSP
30.0000 mL | Freq: Every day | ORAL | Status: DC | PRN
  Administered 2021-10-05: 30 mL via ORAL
  Filled 2021-10-05: qty 30

## 2021-10-05 MED ORDER — TORSEMIDE 20 MG PO TABS
10.0000 mg | ORAL_TABLET | Freq: Every day | ORAL | Status: DC
Start: 2021-10-05 — End: 2021-10-07
  Administered 2021-10-05 – 2021-10-07 (×3): 10 mg via ORAL
  Filled 2021-10-05 (×3): qty 1

## 2021-10-05 MED ORDER — ACETAMINOPHEN 650 MG RE SUPP: 650.0000 mg | Freq: Four times a day (QID) | RECTAL | Status: DC | PRN

## 2021-10-05 MED ORDER — SODIUM CHLORIDE 0.9 % IV SOLN: INTRAVENOUS | Status: DC

## 2021-10-05 MED ORDER — ONDANSETRON HCL 4 MG PO TABS: 4.0000 mg | ORAL_TABLET | Freq: Four times a day (QID) | ORAL | Status: DC | PRN

## 2021-10-05 MED ORDER — CRANBERRY 500 MG PO CAPS: 500.0000 mg | ORAL_CAPSULE | Freq: Every day | ORAL | Status: DC

## 2021-10-05 MED ORDER — LEVOTHYROXINE SODIUM 88 MCG PO TABS
88.0000 ug | ORAL_TABLET | Freq: Every day | ORAL | Status: DC
  Administered 2021-10-05 – 2021-10-17 (×9): 88 ug via ORAL
  Filled 2021-10-05 (×13): qty 1

## 2021-10-05 MED ORDER — MENTHOL (TOPICAL ANALGESIC) 5 % EX PTCH: 1.0000 | MEDICATED_PATCH | Freq: Every day | CUTANEOUS | Status: DC | PRN

## 2021-10-05 MED ORDER — ROSUVASTATIN CALCIUM 10 MG PO TABS
5.0000 mg | ORAL_TABLET | Freq: Every day | ORAL | Status: DC
Start: 2021-10-05 — End: 2021-10-13
  Administered 2021-10-05 – 2021-10-13 (×7): 5 mg via ORAL
  Filled 2021-10-05 (×8): qty 1

## 2021-10-05 MED ORDER — SPIRONOLACTONE 25 MG PO TABS
12.5000 mg | ORAL_TABLET | Freq: Every day | ORAL | Status: DC
  Administered 2021-10-05 – 2021-10-07 (×3): 12.5 mg via ORAL
  Filled 2021-10-05: qty 0.5
  Filled 2021-10-05 (×2): qty 1
  Filled 2021-10-05: qty 0.5
  Filled 2021-10-05: qty 1
  Filled 2021-10-05: qty 0.5

## 2021-10-05 MED ORDER — ADULT MULTIVITAMIN W/MINERALS CH
1.0000 | ORAL_TABLET | Freq: Every day | ORAL | Status: DC
  Administered 2021-10-05 – 2021-10-17 (×11): 1 via ORAL
  Filled 2021-10-05 (×11): qty 1

## 2021-10-05 MED ORDER — CYCLOBENZAPRINE HCL 10 MG PO TABS
10.0000 mg | ORAL_TABLET | Freq: Every day | ORAL | Status: DC
  Administered 2021-10-05 – 2021-10-06 (×2): 10 mg via ORAL
  Filled 2021-10-05 (×2): qty 1

## 2021-10-05 MED ORDER — TAMSULOSIN HCL 0.4 MG PO CAPS
0.4000 mg | ORAL_CAPSULE | Freq: Every day | ORAL | Status: DC
  Administered 2021-10-05 – 2021-10-17 (×10): 0.4 mg via ORAL
  Filled 2021-10-05 (×10): qty 1

## 2021-10-05 MED ORDER — ACETAMINOPHEN 500 MG PO TABS
1000.0000 mg | ORAL_TABLET | Freq: Once | ORAL | Status: AC
  Administered 2021-10-05: 1000 mg via ORAL
  Filled 2021-10-05: qty 2

## 2021-10-05 MED ORDER — ACETAMINOPHEN 325 MG PO TABS
650.0000 mg | ORAL_TABLET | Freq: Four times a day (QID) | ORAL | Status: DC | PRN
  Administered 2021-10-05 – 2021-10-16 (×13): 650 mg via ORAL
  Filled 2021-10-05 (×15): qty 2

## 2021-10-05 MED ORDER — TRAZODONE HCL 50 MG PO TABS
25.0000 mg | ORAL_TABLET | Freq: Every evening | ORAL | Status: DC | PRN
  Administered 2021-10-05 – 2021-10-06 (×2): 25 mg via ORAL
  Filled 2021-10-05 (×2): qty 1

## 2021-10-05 MED ORDER — POTASSIUM CHLORIDE CRYS ER 10 MEQ PO TBCR
10.0000 meq | EXTENDED_RELEASE_TABLET | Freq: Every day | ORAL | Status: DC
Start: 2021-10-05 — End: 2021-10-17
  Administered 2021-10-05 – 2021-10-16 (×10): 10 meq via ORAL
  Filled 2021-10-05 (×12): qty 1

## 2021-10-05 MED ORDER — ASCORBIC ACID 500 MG PO TABS
500.0000 mg | ORAL_TABLET | Freq: Every day | ORAL | Status: DC
  Administered 2021-10-05 – 2021-10-17 (×11): 500 mg via ORAL
  Filled 2021-10-05 (×11): qty 1

## 2021-10-05 MED ORDER — LISINOPRIL 20 MG PO TABS
20.0000 mg | ORAL_TABLET | Freq: Every day | ORAL | Status: DC
  Administered 2021-10-05 – 2021-10-07 (×3): 20 mg via ORAL
  Filled 2021-10-05 (×2): qty 1
  Filled 2021-10-05: qty 2

## 2021-10-05 MED ORDER — IOHEXOL 300 MG/ML  SOLN
100.0000 mL | Freq: Once | INTRAMUSCULAR | Status: AC | PRN
  Administered 2021-10-05: 100 mL via INTRAVENOUS

## 2021-10-05 MED ORDER — MELOXICAM 7.5 MG PO TABS
15.0000 mg | ORAL_TABLET | Freq: Every day | ORAL | Status: DC
  Administered 2021-10-05 – 2021-10-16 (×10): 15 mg via ORAL
  Filled 2021-10-05 (×14): qty 2

## 2021-10-05 MED ORDER — LORATADINE 10 MG PO TABS
10.0000 mg | ORAL_TABLET | Freq: Two times a day (BID) | ORAL | Status: DC
  Administered 2021-10-05 – 2021-10-17 (×21): 10 mg via ORAL
  Filled 2021-10-05 (×21): qty 1

## 2021-10-05 MED ORDER — ONDANSETRON HCL 4 MG/2ML IJ SOLN: 4.0000 mg | Freq: Four times a day (QID) | INTRAMUSCULAR | Status: DC | PRN

## 2021-10-05 MED ORDER — METOPROLOL SUCCINATE ER 50 MG PO TB24
100.0000 mg | ORAL_TABLET | Freq: Every day | ORAL | Status: DC
  Administered 2021-10-05 – 2021-10-07 (×3): 100 mg via ORAL
  Filled 2021-10-05 (×3): qty 2

## 2021-10-05 MED ORDER — MELOXICAM 7.5 MG PO TABS
15.0000 mg | ORAL_TABLET | Freq: Once | ORAL | Status: AC
  Administered 2021-10-05: 15 mg via ORAL
  Filled 2021-10-05 (×2): qty 2

## 2021-10-05 NOTE — Hospital Course (Addendum)
Jason [name] is a 66 y.o. occasion male with medical history significant for essential hypertension, hypothyroidism, GERD and osteoarthritis, presented to the emergency room 10/04/2021 with continued chronic neck pain since last December and associated brief symptoms left hand thumb and index paresthesias with tingling and numbness without weakness.  He had a recent MRI under anesthesia on 8/10 at [facility] hat revealed multifocal abnormal marrow signal with relatively diffuse involvement of C4 into the thoracic spine with associated epidural disease, greatest at C6 where there is marked canal stenosis without cord compression.  There was left greater than right foraminal effacement due to extraosseous disease.  It was thought that this may reflect metastatic disease or marrow infiltrative neoplastic process.  The patient has been followed by spine specialist with [facility].  He denied any headache or dizziness or blurred vision.  He cannot lift his left arm above 90 degrees.  08/11: In ED, CT abdomen/pelvis with contrast (+) retroperitoneal/pelvic lymphadenopathy c/w metastatic disease, lytic areas L4 S1 c/w osseous metastasis.  Patient was admitted to hospitalist service for cervical spinal stenosis with diminished sensation/strength LUE, pain control.  Per H&P, neurosurgery was notified about the patient (Dr. Cari Caraway), and oncology was notified about the patient (Dr. Tasia Catchings).  08/12: Discussed with Dr. Cari Caraway and Dr. Tasia Catchings, plan for surgery tomorrow to stabilize and biopsy C-spine. Dr. Tasia Catchings will see tomorrow. PET scan unavailable over the weekend.  08/13: s/p C6 cervical laminectomy and resection of epidural spinal tumor today, d/w neurosurgery does not need steroids. 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  08/14: significant weakness in upper and lower extremities this morning. Drain in place. BP soft, gave 1L bolus and held BP meds and IV pain meds. Confusion resolved and weakness improving later in the day, however BP again dropped lower and pt was transferred to ICU for pressor support. Also getting trope/EKG/Echo, lactate, procal, UA/UCx, BCx, ABG, MRI 08/15: MRI brain and rest of spine while intubated. (+)metastatic disease to brain, extensive osseous metastatic disease throughout the thoracolumbar spine and pelvis, epidural mets T and L spine levels. Extubated and following commands. 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

## 2021-10-05 NOTE — Assessment & Plan Note (Addendum)
   Neurosurgery following  s/p C6 cervical laminectomy and resection of epidural spinal tumor 10/06/21   d/w neurosurgery does not need steroids   Pain management is adequate  Bilateral UE and LE extremity weakness 10/07/21, strength above C5 appears normal, made neurosurgery aware

## 2021-10-05 NOTE — Consult Note (Addendum)
Referring Physician:  No referring provider defined for this encounter.  Primary Physician:  Default, Provider, MD  History of Present Illness: 10/05/2021 [name] is here today with a chief complaint of numbness in his left upper extremity as well as weakness in his left upper extremity with recent MRI showing concern for neoplastic involvement of the spine.  He presented to the emergency department last evening for expedited work-up.  I was consulted due to epidural involvement in the cervical spine.  He reports that he had a left shoulder arthroplasty in March of this year.  Prior to that, he was having pain around his shoulder blade and left shoulder.  His shoulder arthroplasty did help somewhat, but he has continued to have left-sided weakness with abduction of his left shoulder and with extension of his arm.  This been ongoing for several months.  He has tingling down his left arm into his first 2 digits of his left hand.  He denies any issues with his balance.     Review of Systems:  A 10 point review of systems is negative, except for the pertinent positives and negatives detailed in the HPI.  Past Medical History: Past Medical History:  Diagnosis Date   Allergy    Arthritis    osteoarthritis   GERD (gastroesophageal reflux disease)    Hypertension    Hypothyroidism    Thyroid disease    Hyperthyroidism s/p radioactive iodine ablation    Past Surgical History: Past Surgical History:  Procedure Laterality Date   CARDIAC CATHETERIZATION  03/2000   HERNIA REPAIR  [id number]   umbilical   LEFT HEART CATH AND CORONARY ANGIOGRAPHY Left 03/20/2020   Procedure: LEFT HEART CATH AND CORONARY ANGIOGRAPHY;  Surgeon: Nelva Bush, MD;  Location: [facility];  Service: Cardiovascular;  Laterality: Left;   RADIOLOGY WITH ANESTHESIA Left 04/18/2021   Procedure: MRI SHOULDER WITHOUT CONTRAST WITH ANESTHESIA;  Surgeon: Radiologist, Medication, MD;  Location: [place];  Service: Radiology;  Laterality: Left;   RADIOLOGY WITH ANESTHESIA N/A 10/03/2021   Procedure: MRI CERVICAL SPINE WITH ANESTHESIA;  Surgeon: Radiologist, Medication, MD;  Location: [place];  Service: Radiology;  Laterality: N/A;   REVERSE SHOULDER ARTHROPLASTY Left    ROTATOR CUFF REPAIR  11/2010   Dr [name] Heck   TONSILLECTOMY     TOTAL HIP ARTHROPLASTY Right 02/24/2009   TOTAL HIP ARTHROPLASTY Left 06/2016   Dr Harlow Mares    Allergies: Allergies as of 10/04/2021   (No Known Allergies)    Medications: Current Meds  Medication Sig   acetaminophen (TYLENOL) 500 MG tablet Take 1,000 mg by mouth in the morning, at noon, and at bedtime.   Ascorbic Acid (VITA-C PO) Take 1,000 mg by mouth daily.   cetirizine (ZYRTEC) 10 MG tablet Take 10 mg by mouth at bedtime.   CRANBERRY PO Take 500 mg by mouth at bedtime.   cyclobenzaprine (FLEXERIL) 10 MG tablet TAKE 1 TABLET BY MOUTH EVERY DAY AT NIGHT AS NEEDED (Patient taking differently: Take 10 mg by mouth at bedtime. TAKE 1 TABLET BY MOUTH EVERY DAY AT NIGHT AS NEEDED)   levothyroxine (SYNTHROID) 88 MCG tablet TAKE 1 TABLET BY MOUTH EVERY DAY (Patient taking differently: Take 88 mcg by mouth daily before breakfast.)   lisinopril (ZESTRIL) 20 MG tablet TAKE 1 TABLET BY MOUTH EVERY DAY   loratadine (CLARITIN) 10 MG tablet Take 10 mg by mouth in the morning and at bedtime.   meloxicam (MOBIC) 15 MG tablet Take  1 tablet (15 mg total) by mouth daily.   metoprolol succinate (TOPROL-XL) 100 MG 24 hr tablet TAKE 1 TABLET BY MOUTH EVERY DAY WITH OR IMMEDIATELY FOLLOWING A MEAL (Patient taking differently: Take 100 mg by mouth daily.)   Multiple Vitamin (MULTIVITAMIN) tablet Take 1 tablet by mouth daily.   potassium chloride (KLOR-CON) 10 MEQ tablet TAKE 1 TABLET BY MOUTH EVERY DAY (Patient taking differently: Take 10 mEq by mouth at bedtime.)   rosuvastatin (CRESTOR) 5 MG tablet TAKE 1 TABLET (5 MG TOTAL) BY MOUTH DAILY.   spironolactone (ALDACTONE) 25 MG  tablet TAKE 1/2 TABLET BY MOUTH EVERY DAY   tamsulosin (FLOMAX) 0.4 MG CAPS capsule TAKE 1 CAPSULE BY MOUTH EVERY DAY   torsemide (DEMADEX) 10 MG tablet TAKE 1 TABLET BY MOUTH EVERY DAY    Social History: Social History   Tobacco Use   Smoking status: Never   Smokeless tobacco: Never  Vaping Use   Vaping Use: Never used  Substance Use Topics   Alcohol use: Not Currently    Comment: Alcohol once every few weeks/months   Drug use: No    Family Medical History: Family History  Problem Relation Age of Onset   Hypertension Mother    Stroke Mother    Gout Father    Heart attack Father 69   Heart disease Sister    Colon cancer Neg Hx    Esophageal cancer Neg Hx    Rectal cancer Neg Hx    Stomach cancer Neg Hx     Physical Examination: Vitals:   10/05/21 0606 10/05/21 0926  BP: (!) 162/87 (!) 146/86  Pulse: 84 90  Resp: 17 17  Temp:  97.6 F (36.4 C)  SpO2: 99% 96%    General: Patient is well developed, well nourished, calm, collected, and in no apparent distress. Attention to examination is appropriate.  Neck:   Supple.  Limited range of motion.  He has somewhat limited rotation to the right and extension of his neck.  Respiratory: Patient is breathing without any difficulty.   NEUROLOGICAL:     Awake, alert, oriented to person, place, and time.  Speech is clear and fluent. Fund of knowledge is appropriate.   Cranial Nerves: Pupils equal round and reactive to light.  Facial tone is symmetric.  Facial sensation is symmetric. Shoulder shrug is symmetric. Tongue protrusion is midline.  There is no pronator drift.    Strength: Side Biceps Triceps Deltoid Interossei Grip Wrist Ext. Wrist Flex.  R '5 5 5 5 5 5 5  '$ L 5 4- 4- '5 5 5 5   '$ Side Iliopsoas Quads Hamstring PF DF EHL  R '5 5 5 5 5 5  '$ L '5 5 5 5 5 5   '$ Reflexes are 1+ and symmetric at the biceps, triceps, brachioradialis, patella and achilles.   Hoffman's is absent.  Clonus is not present.  Toes are  down-going.  Bilateral upper and lower extremity sensation is intact to light touch with exception of left C6 and 7 distributions which appear to be altered.    No evidence of dysmetria noted.  Gait is normal.     Medical Decision Making  Imaging: MRI C spine 10/03/21 IMPRESSION: Multifocal abnormal marrow signal with relatively diffuse involvement of C4 into the thoracic spine. Associated epidural disease, greatest at C6 where there is marked canal stenosis without cord compression. Left greater than right foraminal effacement due to extraosseous disease.   May reflect metastatic disease or marrow infiltrative neoplastic process.  These results will be called to the ordering clinician or representative by the Radiologist Assistant, and communication documented in the PACS or [employer].     Electronically Signed   By: Macy Mis M.D.   On: 10/03/2021 10:28  CT CAP 10/05/21 IMPRESSION: 1. Moderate severity retroperitoneal and pelvic lymphadenopathy, consistent with metastatic disease. 2. Small lytic areas at the levels of L4 and S1, with diffusely sclerotic changes involving the second and fourth right ribs, sacrum and left iliac bone. 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 Further evaluation with a whole body nuclear medicine bone scan is recommended. 3. 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 4. Sigmoid diverticulosis. 5. Aortic atherosclerosis.   Aortic Atherosclerosis (ICD10-I70.0).     Electronically Signed   By: Virgina Norfolk M.D.   On: 10/05/2021 01:56    I have personally reviewed the images and agree with the above interpretation.  Assessment and Plan: Jason [name] is a pleasant 66 y.o. 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  At this point he does not have a tissue diagnosis.  He does have severe compression of  the spinal cord between C5 and C7.  There is a dorsal epidural component which compresses the spinal cord as well as the left-sided nerve roots that are consistent with the symptoms.  I discussed at length with the patient and his wife that the first order business is to get a tissue diagnosis.  This may be obtained by open surgery or by interventional radiology.  I will discuss with the oncologist regarding the most appropriate step forward.  That may involve additional imaging such as a PET/CT depending on her evaluation.  I spent a total of 60 minutes in face-to-face and non-face-to-face activities related to this patient's care today.  Thank you for involving me in the care of this patient.      Amzie Sillas K. Izora Ribas MD, [facility] Neurosurgery   Addendum to my prior note:  I spoke with Dr. Tasia Catchings, who has reviewed the case.  We discussed the rationale behind surgical intervention, which we have recommended to the patient due to his weakness, severe stenosis, and need for tissue diagnosis.  The goals of surgery are decompression and stabilization of the spinal cord, as well as obtaining tissue for diagnosis.    I will order a CT to finalize surgical planning.  I discussed the planned procedure at length with the patient and his wife, including the risks, benefits, alternatives, and indications. The risks discussed include but are not limited to bleeding, infection, need for reoperation, spinal fluid leak, stroke, vision loss, anesthetic complication, coma, paralysis, and even death. I also described in detail that improvement was not guaranteed.  The patient expressed understanding of these risks, and asked that we proceed with surgery. I described the surgery in layman's terms, and gave ample opportunity for questions, which were answered to the best of my ability.

## 2021-10-05 NOTE — ED Notes (Signed)
ED Provider at bedside. 

## 2021-10-05 NOTE — Assessment & Plan Note (Addendum)
osseous metastasis  lytic lesions at L4 and S1 retroperitoneal and pelvic lymphadenopathy.  will need a PET scan  Oncology to see tomorrow  Neurosurgery to obtain bone biopsy on procedure tomorrow to stabilize cervical spine

## 2021-10-05 NOTE — ED Notes (Signed)
Pt ambulated to restroom independently.

## 2021-10-05 NOTE — Assessment & Plan Note (Addendum)
   We will continue Flomax. 

## 2021-10-05 NOTE — H&P (Addendum)
[place]   PATIENT NAME: Jason [name]    MR#:  [id number]  DATE OF BIRTH:  [birth year]  DATE OF ADMISSION:  10/04/2021  PRIMARY CARE PHYSICIAN: Default, Provider, MD   Patient is coming from: Home  REQUESTING/REFERRING PHYSICIAN: Hulan Saas, MD  CHIEF COMPLAINT:   Chief Complaint  Patient presents with   Neck Pain    HISTORY OF PRESENT ILLNESS:  Jason [name] is a 66 y.o. occasion male with medical history significant for essential hypertension, hypothyroidism, GERD and osteoarthritis, presented to the emergency room with continued chronic neck pain since last December and associated brief symptoms left hand thumb and index paresthesias with tingling and numbness without weakness.  He had a recent MRI under anesthesia on 8/10 at [facility] hat revealed multifocal abnormal marrow signal with relatively diffuse involvement of C4 into the thoracic spine with associated epidural disease, greatest at C6 where there is marked canal stenosis without cord compression.  There was left greater than right foraminal effacement due to extraosseous disease.  It was thought that this may reflect metastatic disease or marrow infiltrative neoplastic process.  The patient has been followed by spine specialist with [facility].  He denied any headache or dizziness or blurred vision.  He cannot lift his left arm above 90 degrees.  No nausea vomiting or abdominal pain.  No chest pain or cough or wheezing or dyspnea.  No dysuria, oliguria or hematuria or flank pain.  No other bleeding diathesis.  ED Course: When he came to the ER, BP was 178/103 with otherwise normal vital signs.  Labs revealed unremarkable BMP and CBC showed mild leukocytosis 12.7 with neutrophilia.  Imaging: Abdominal and pelvic CT scan with contrast revealed the following: 1. Moderate severity retroperitoneal and pelvic lymphadenopathy, consistent with metastatic disease. 2. Small lytic areas at the levels of L4  and S1, with diffusely sclerotic changes involving the second and fourth right ribs, sacrum and left iliac bone. 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 Further evaluation with a whole body nuclear medicine bone scan is recommended. 3. 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 4. Sigmoid diverticulosis. 5. Aortic atherosclerosis.  The patient was given 1 g of p.o. Tylenol and 50 mg of p.o. Mobic he will be admitted to a medical bed for further evaluation and management.  PAST MEDICAL HISTORY:   Past Medical History:  Diagnosis Date   Allergy    Arthritis    osteoarthritis   GERD (gastroesophageal reflux disease)    Hypertension    Hypothyroidism    Thyroid disease    Hyperthyroidism s/p radioactive iodine ablation    PAST SURGICAL HISTORY:   Past Surgical History:  Procedure Laterality Date   CARDIAC CATHETERIZATION  03/2000   HERNIA REPAIR  0175   umbilical   LEFT HEART CATH AND CORONARY ANGIOGRAPHY Left 03/20/2020   Procedure: LEFT HEART CATH AND CORONARY ANGIOGRAPHY;  Surgeon: Nelva Bush, MD;  Location: [facility];  Service: Cardiovascular;  Laterality: Left;   RADIOLOGY WITH ANESTHESIA Left 04/18/2021   Procedure: MRI SHOULDER WITHOUT CONTRAST WITH ANESTHESIA;  Surgeon: Radiologist, Medication, MD;  Location: [facility];  Service: Radiology;  Laterality: Left;   RADIOLOGY WITH ANESTHESIA N/A 10/03/2021   Procedure: MRI CERVICAL SPINE WITH ANESTHESIA;  Surgeon: Radiologist, Medication, MD;  Location: [facility];  Service: Radiology;  Laterality: N/A;   REVERSE SHOULDER ARTHROPLASTY Left    ROTATOR CUFF REPAIR  11/2010  Dr [name] Heck   TONSILLECTOMY     TOTAL HIP ARTHROPLASTY Right 02/24/2009   TOTAL HIP ARTHROPLASTY Left 06/2016   Dr Harlow Mares    SOCIAL HISTORY:   Social History   Tobacco Use   Smoking status: Never   Smokeless tobacco: Never  Substance Use Topics   Alcohol use: Not  Currently    Comment: Alcohol once every few weeks/months    FAMILY HISTORY:   Family History  Problem Relation Age of Onset   Hypertension Mother    Stroke Mother    Gout Father    Heart attack Father 46   Heart disease Sister    Colon cancer Neg Hx    Esophageal cancer Neg Hx    Rectal cancer Neg Hx    Stomach cancer Neg Hx     DRUG ALLERGIES:  No Known Allergies  REVIEW OF SYSTEMS:   ROS As per history of present illness. All pertinent systems were reviewed above. Constitutional, HEENT, cardiovascular, respiratory, GI, GU, musculoskeletal, neuro, psychiatric, endocrine, integumentary and hematologic systems were reviewed and are otherwise negative/unremarkable except for positive findings mentioned above in the HPI.   MEDICATIONS AT HOME:   Prior to Admission medications   Medication Sig Start Date End Date Taking? Authorizing Provider  acetaminophen (TYLENOL) 500 MG tablet Take 1,000 mg by mouth in the morning, at noon, and at bedtime.   Yes [provider]  Ascorbic Acid (VITA-C PO) Take 1,000 mg by mouth daily.   Yes [provider]  cetirizine (ZYRTEC) 10 MG tablet Take 10 mg by mouth at bedtime.   Yes [provider]  CRANBERRY PO Take 500 mg by mouth at bedtime.   Yes [provider]  cyclobenzaprine (FLEXERIL) 10 MG tablet TAKE 1 TABLET BY MOUTH EVERY DAY AT NIGHT AS NEEDED Patient taking differently: Take 10 mg by mouth at bedtime. TAKE 1 TABLET BY MOUTH EVERY DAY AT NIGHT AS NEEDED 08/19/21  Yes Venia Carbon, MD  levothyroxine (SYNTHROID) 88 MCG tablet TAKE 1 TABLET BY MOUTH EVERY DAY Patient taking differently: Take 88 mcg by mouth daily before breakfast. 09/23/21  Yes Viviana Simpler I, MD  lisinopril (ZESTRIL) 20 MG tablet TAKE 1 TABLET BY MOUTH EVERY DAY 08/19/21  Yes End, Harrell Gave, MD  loratadine (CLARITIN) 10 MG tablet Take 10 mg by mouth in the morning and at bedtime.   Yes [provider]  meloxicam  (MOBIC) 15 MG tablet Take 1 tablet (15 mg total) by mouth daily. 03/12/21  Yes Paulette Blanch, MD  metoprolol succinate (TOPROL-XL) 100 MG 24 hr tablet TAKE 1 TABLET BY MOUTH EVERY DAY WITH OR IMMEDIATELY FOLLOWING A MEAL Patient taking differently: Take 100 mg by mouth daily. 09/23/21  Yes End, Harrell Gave, MD  Multiple Vitamin (MULTIVITAMIN) tablet Take 1 tablet by mouth daily.   Yes [provider]  potassium chloride (KLOR-CON) 10 MEQ tablet TAKE 1 TABLET BY MOUTH EVERY DAY Patient taking differently: Take 10 mEq by mouth at bedtime. 09/23/21  Yes End, Harrell Gave, MD  rosuvastatin (CRESTOR) 5 MG tablet TAKE 1 TABLET (5 MG TOTAL) BY MOUTH DAILY. 09/23/21 12/22/21 Yes End, Harrell Gave, MD  spironolactone (ALDACTONE) 25 MG tablet TAKE 1/2 TABLET BY MOUTH EVERY DAY 09/23/21  Yes End, Harrell Gave, MD  tamsulosin (FLOMAX) 0.4 MG CAPS capsule TAKE 1 CAPSULE BY MOUTH EVERY DAY 09/27/20  Yes Viviana Simpler I, MD  torsemide (DEMADEX) 10 MG tablet TAKE 1 TABLET BY MOUTH EVERY DAY 08/19/21  Yes End, Harrell Gave, MD  amoxicillin (AMOXIL) 500 MG capsule Take 2,000 mg by mouth See admin instructions. Take 4 tablets by mouth prior to dental procedures. Patient not taking: Reported on 10/05/2021 07/12/20   [provider]  Menthol, Topical Analgesic, (ICY HOT) 5 % PTCH Apply 1-2 patches topically daily as needed (pain).    [provider]  oxyCODONE-acetaminophen (PERCOCET/ROXICET) 5-325 MG tablet Take 1 tablet by mouth every 4 (four) hours as needed for severe pain. 05/06/21   Venia Carbon, MD  sildenafil (REVATIO) 20 MG tablet Take 3-5 tablets (60-100 mg total) by mouth daily as needed. 10/05/18   Venia Carbon, MD      VITAL SIGNS:  Blood pressure (!) 162/87, pulse 84, temperature 98.4 F (36.9 C), temperature source Oral, resp. rate 17, height '5\' 9"'$  (1.753 m), weight 85 kg, SpO2 99 %.  PHYSICAL EXAMINATION:  Physical Exam  GENERAL:  66 y.o.-year-old Caucasian male patient  lying in the bed with no acute distress.  EYES: Pupils equal, round, reactive to light and accommodation. No scleral icterus. Extraocular muscles intact.  HEENT: Head atraumatic, normocephalic. Oropharynx and nasopharynx clear.  NECK:  Supple, no jugular venous distention. No thyroid enlargement, no tenderness.  LUNGS: Normal breath sounds bilaterally, no wheezing, rales,rhonchi or crepitation. No use of accessory muscles of respiration.  CARDIOVASCULAR: Regular rate and rhythm, S1, S2 normal. No murmurs, rubs, or gallops.  ABDOMEN: Soft, nondistended, nontender. Bowel sounds present. No organomegaly or mass.  EXTREMITIES: No pedal edema, cyanosis, or clubbing.  NEUROLOGIC: Cranial nerves II through XII are intact. Muscle strength 5/5 in all extremities. Sensation was only diminished in the left thumb and index finger and was otherwise within normal..  Gait not checked.  PSYCHIATRIC: The patient is alert and oriented x 3.  Normal affect and good eye contact. SKIN: No obvious rash, lesion, or ulcer.   LABORATORY PANEL:   CBC Recent Labs  Lab 10/05/21 0511  WBC 11.7*  HGB 12.3*  HCT 36.6*  PLT 153   ------------------------------------------------------------------------------------------------------------------  Chemistries  Recent Labs  Lab 10/05/21 0511  NA 135  K 3.5  CL 101  CO2 26  GLUCOSE 117*  BUN 22  CREATININE 0.79  CALCIUM 9.1   ------------------------------------------------------------------------------------------------------------------  Cardiac Enzymes No results for input(s): "TROPONINI" in the last 168 hours. ------------------------------------------------------------------------------------------------------------------  RADIOLOGY:  CT CHEST ABDOMEN PELVIS W CONTRAST  Result Date: 10/05/2021 CLINICAL DATA:  Metastatic disease evaluation. EXAM: CT CHEST, ABDOMEN, AND PELVIS WITH CONTRAST TECHNIQUE: Multidetector CT imaging of the chest, abdomen and  pelvis was performed following the standard protocol during bolus administration of intravenous contrast. RADIATION DOSE REDUCTION: This exam was performed according to the departmental dose-optimization program which includes automated exposure control, adjustment of the mA and/or kV according to patient size and/or use of iterative reconstruction technique. CONTRAST:  115m OMNIPAQUE IOHEXOL 300 MG/ML  SOLN COMPARISON:  None Available. FINDINGS: CT CHEST FINDINGS Cardiovascular: No significant vascular findings. Normal heart size. No pericardial effusion. Mediastinum/Nodes: No enlarged mediastinal, hilar, or axillary lymph nodes. Thyroid gland, trachea, and esophagus demonstrate no significant findings. Lungs/Pleura: Mild lingular and mild right middle lobe linear atelectasis is seen. There is no evidence of acute infiltrate, pleural effusion or pneumothorax. Musculoskeletal: A left shoulder replacement is seen with associated streak artifact. Hazy, sclerotic appearing areas are seen involving the anterolateral aspects of the second and fourth right ribs. Multilevel degenerative changes seen throughout the thoracic spine. CT ABDOMEN PELVIS FINDINGS Hepatobiliary: No focal liver abnormality is seen. No gallstones, gallbladder wall thickening, or  biliary dilatation. Pancreas: Unremarkable. No pancreatic ductal dilatation or surrounding inflammatory changes. Spleen: Punctate calcified granulomas are seen within the posterior aspect of an otherwise normal-appearing spleen. Adrenals/Urinary Tract: Adrenal glands are unremarkable. Kidneys are normal, without renal calculi, focal lesion, or hydronephrosis. The urinary bladder is limited in evaluation secondary to the presence of overlying streak artifact. Stomach/Bowel: Stomach is within normal limits. The appendix is not identified. Stool is seen throughout the large bowel. No evidence of bowel wall thickening, distention, or inflammatory changes. Noninflamed  diverticula are seen throughout the sigmoid colon. Vascular/Lymphatic: Aortic atherosclerosis. Moderate severity Para-aortic and aortocaval lymphadenopathy is seen. This extends into the pelvis along the bilateral iliac chains, right greater than left Reproductive: The prostate gland is markedly limited in evaluation secondary to the presence of overlying streak artifact. Other: No abdominal wall hernia or abnormality. No abdominopelvic ascites. 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 There is an extensive amount of associated streak artifact with subsequently limited evaluation of the adjacent osseous and soft tissue structures. Small lytic areas are seen at the levels of L4 and S1, with diffusely sclerotic changes seen throughout the remainder of the sacrum and left iliac bone. A mixed cystic and sclerotic lesion is seen within the right iliac bone. Multilevel degenerative changes are seen throughout the lumbar spine. IMPRESSION: 1. Moderate severity retroperitoneal and pelvic lymphadenopathy, consistent with metastatic disease. 2. Small lytic areas at the levels of L4 and S1, with diffusely sclerotic changes involving the second and fourth right ribs, sacrum and left iliac bone. 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 Further evaluation with a whole body nuclear medicine bone scan is recommended. 3. 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 4. Sigmoid diverticulosis. 5. Aortic atherosclerosis. Aortic Atherosclerosis (ICD10-I70.0). Electronically Signed   By: Virgina Norfolk M.D.   On: 10/05/2021 01:56   MR CERVICAL SPINE WO CONTRAST  Result Date: 10/03/2021 CLINICAL DATA:  Neck and left shoulder pain EXAM: MRI CERVICAL SPINE WITHOUT CONTRAST TECHNIQUE: Multiplanar, multisequence MR imaging of the cervical spine was performed. No intravenous contrast was administered. COMPARISON:  None Available. FINDINGS: Alignment:  Reversal of cervical lordosis. Mild anterolisthesis at C3-C4. Vertebrae: Multifocal abnormal marrow signal particularly involving C4-C7 and included upper thoracic vertebral bodies. This includes the posterior elements. Dorsal epidural soft tissue is present at C4. Circumferential involvement is present at C5-C7. There is marked canal stenosis dorsal to C6 without cord compression. Cord: No abnormal signal. Posterior Fossa, vertebral arteries, paraspinal tissues: Partially imaged paranasal sinus opacification. Disc levels: C2-C3:  Left facet hypertrophy.  No canal or foraminal stenosis. C3-C4: Disc bulge with endplate osteophytes. Left facet hypertrophy. Effacement of the ventral subarachnoid space without significant canal narrowing. No foraminal stenosis. C4-C5: Disc bulge with superimposed small right foraminal protrusion and endplate osteophytes. Left greater than right facet hypertrophy. Dorsal epidural soft tissue. No canal stenosis. No left foraminal stenosis. Probable mild right foraminal stenosis secondary to extraosseous disease. C5-C6: Disc bulge with endplate osteophytes. Facet and uncovertebral hypertrophy. Circumferential epidural soft tissue. Moderate to marked canal stenosis at disc level. Effacement of left greater than right foramina due to extraosseous disease. C6-C7: Disc bulge with endplate osteophytes. Uncovertebral and facet hypertrophy. Moderate canal stenosis at disc level. Effacement of left greater than right foramina due to extraosseous disease. C7-T1: No canal or right foraminal stenosis. Partial effacement left foramen due to extraosseous disease. IMPRESSION: Multifocal abnormal marrow signal with relatively diffuse involvement of C4 into the thoracic spine. Associated epidural disease,  greatest at C6 where there is marked canal stenosis without cord compression. Left greater than right foraminal effacement due to extraosseous disease. May reflect metastatic disease or marrow  infiltrative neoplastic process. These results will be called to the ordering clinician or representative by the Radiologist Assistant, and communication documented in the PACS or [employer]. Electronically Signed   By: Macy Mis M.D.   On: 10/03/2021 10:28      IMPRESSION AND PLAN:  Assessment and Plan: * Cervical spinal stenosis - The patient will be admitted to a medical bed. - Neurosurgery consult will be obtained. - Dr. Izora Ribas was notified about the patient - Pain management for cervicalgia will be provided.  Metastatic disease ([place]) - This is manifested by osseous metastasis with lytic lesions at L4 and S1 as well as retroperitoneal and pelvic lymphadenopathy. - The patient will need a PET scan. - Oncology consult will be obtained. - I notified Dr. Tasia Catchings about the patient. - Pain management will be provided.  Hypertensive urgency - The patient will be placed on as needed IV labetalol. - We will continue his antihypertensives.  BPH (benign prostatic hyperplasia) - We will continue Flomax.  Essential hypertension - We will continue his antihypertensive therapy with lisinopril and Toprol-XL.   DVT prophylaxis: SCDs. Advanced Care Planning:  Code Status: full code.  Family Communication:  The plan of care was discussed in details with the patient (and family). I answered all questions. The patient agreed to proceed with the above mentioned plan. Further management will depend upon hospital course. Disposition Plan: Back to previous home environment Consults called: Neurosurgery and oncology. All the records are reviewed and case discussed with ED provider.  Status is: Inpatient   At the time of the admission, it appears that the appropriate admission status for this patient is inpatient.  This is judged to be reasonable and necessary in order to provide the required intensity of service to ensure the patient's safety given the presenting symptoms, physical exam  findings and initial radiographic and laboratory data in the context of comorbid conditions.  The patient requires inpatient status due to high intensity of service, high risk of further deterioration and high frequency of surveillance required.  I certify that at the time of admission, it is my clinical judgment that the patient will require inpatient hospital care extending more than 2 midnights.                            Dispo: The patient is from: Home              Anticipated d/c is to: Home              Patient currently is not medically stable to d/c.              Difficult to place patient: No  Christel Mormon M.D on 10/05/2021 at 6:23 AM  [facility]   From 7 PM-7 AM, contact night-coverage [email or web address]  CC: Primary care physician; Default, Provider, MD

## 2021-10-05 NOTE — Assessment & Plan Note (Addendum)
   continue his antihypertensive therapy with lisinopril and Toprol-XL.

## 2021-10-05 NOTE — Assessment & Plan Note (Addendum)
Resolved Baseline HTN   as needed IV labetalol.  continue his antihypertensives.

## 2021-10-05 NOTE — Progress Notes (Signed)
  PROGRESS NOTE - brief please see full H&P from earlier today    Jason [name]   XBM:[id number] DOB: 03/01/1955  DOA: 10/04/2021 Date of Service: 10/05/21 PCP: Default, Provider, MD     Brief Narrative / Hospital Course:  Jason [name] is a 66 y.o. occasion male with medical history significant for essential hypertension, hypothyroidism, GERD and osteoarthritis, presented to the emergency room 10/04/2021 with continued chronic neck pain since last December and associated brief symptoms left hand thumb and index paresthesias with tingling and numbness without weakness.  He had a recent MRI under anesthesia on 8/10 at [facility] hat revealed multifocal abnormal marrow signal with relatively diffuse involvement of C4 into the thoracic spine with associated epidural disease, greatest at C6 where there is marked canal stenosis without cord compression.  There was left greater than right foraminal effacement due to extraosseous disease.  It was thought that this may reflect metastatic disease or marrow infiltrative neoplastic process.  The patient has been followed by spine specialist with [facility].  He denied any headache or dizziness or blurred vision.  He cannot lift his left arm above 90 degrees.  08/11: In ED, CT abdomen/pelvis with contrast (+) retroperitoneal/pelvic lymphadenopathy c/w metastatic disease, lytic areas L4 S1 c/w osseous metastasis.  Patient was admitted to hospitalist service for cervical spinal stenosis with diminished sensation/strength LUE, pain control.  Per H&P, neurosurgery was notified about the patient (Dr. Cari Caraway), and oncology was notified about the patient (Dr. Tasia Catchings).  08/12: Discussed with Dr. Cari Caraway and Dr. Tasia Catchings, plan for surgery tomorrow to stabilize and biopsy C-spine. Dr. Tasia Catchings will see tomorrow. PET scan unavailable over the weekend.    Consultants:  Neurosurgery Oncology  Procedures: None at this time    Subjective: Patient reports no  chest pain/shortness of breath, arm weakness/numbness significant change since presentation to the ED. Pain controlled.      ASSESSMENT & PLAN:   Principal Problem:   Cervical spinal stenosis Active Problems:   Metastatic disease ([place])   Hypertensive urgency   Hypothyroidism   Essential hypertension   GERD   BPH (benign prostatic hyperplasia)   Hyperlipidemia   Cervical spinal stenosis Neurosurgery following Plan for surgery tomorrow 10/06/21 to decompress C-spine and perform biopsy  Pain management   Metastatic disease ([place]) osseous metastasis  lytic lesions at L4 and S1 retroperitoneal and pelvic lymphadenopathy. will need a PET scan Oncology to see tomorrow Neurosurgery to obtain bone biopsy on procedure tomorrow to stabilize cervical spine  Essential hypertension continue his antihypertensive therapy with lisinopril and Toprol-XL.  BPH (benign prostatic hyperplasia) continue Flomax.  Hypertensive urgency Resolved Baseline HTN  as needed IV labetalol. continue his antihypertensives.    DVT prophylaxis: SCD / ambulation Code Status: FULL Family Communication: wife at bedside Disposition Plan / TOC needs: pending clinical course Barriers to discharge / significant pending items: surgery tomorrow, PET scan Monday          Teriyah Purington, DO [facility] 10/05/2021, 2:47 PM   Staff may message me via secure chat in [place]  but this may not receive immediate response,  please page for urgent matters!  If 7PM-7AM, please contact night-coverage [email or web address]  Dictation software was used to generate the above note. Typos may occur and escape review, as with typed/written notes. Please contact Dr Sheppard Coil directly for clarity if needed.

## 2021-10-05 NOTE — Progress Notes (Signed)
PHARMACIST - PHYSICIAN ORDER COMMUNICATION  CONCERNING: P&T Medication Policy on Herbal Medications  DESCRIPTION:  This patient's order for:  Cranberry 500 mg Caps & Menthol 5% Patch  has been noted.  This product(s) is classified as an "herbal" or natural product. Due to a lack of definitive safety studies or FDA approval, nonstandard manufacturing practices, plus the potential risk of unknown drug-drug interactions while on inpatient medications, the Pharmacy and Therapeutics Committee does not permit the use of "herbal" or natural products of this type within [facility].   ACTION TAKEN: The pharmacy department is unable to verify this order at this time.  Please reevaluate patient's clinical condition at discharge and address if the herbal or natural product(s) should be resumed at that time.  Renda Rolls, PharmD, [facility] 10/05/2021 4:34 AM

## 2021-10-05 NOTE — H&P (View-Only) (Signed)
Referring Physician:  No referring provider defined for this encounter.  Primary Physician:  Default, Provider, MD  History of Present Illness: 10/05/2021 [name] is here today with a chief complaint of numbness in his left upper extremity as well as weakness in his left upper extremity with recent MRI showing concern for neoplastic involvement of the spine.  He presented to the emergency department last evening for expedited work-up.  I was consulted due to epidural involvement in the cervical spine.  He reports that he had a left shoulder arthroplasty in March of this year.  Prior to that, he was having pain around his shoulder blade and left shoulder.  His shoulder arthroplasty did help somewhat, but he has continued to have left-sided weakness with abduction of his left shoulder and with extension of his arm.  This been ongoing for several months.  He has tingling down his left arm into his first 2 digits of his left hand.  He denies any issues with his balance.     Review of Systems:  A 10 point review of systems is negative, except for the pertinent positives and negatives detailed in the HPI.  Past Medical History: Past Medical History:  Diagnosis Date   Allergy    Arthritis    osteoarthritis   GERD (gastroesophageal reflux disease)    Hypertension    Hypothyroidism    Thyroid disease    Hyperthyroidism s/p radioactive iodine ablation    Past Surgical History: Past Surgical History:  Procedure Laterality Date   CARDIAC CATHETERIZATION  03/2000   HERNIA REPAIR  [id number]   umbilical   LEFT HEART CATH AND CORONARY ANGIOGRAPHY Left 03/20/2020   Procedure: LEFT HEART CATH AND CORONARY ANGIOGRAPHY;  Surgeon: Nelva Bush, MD;  Location: [facility];  Service: Cardiovascular;  Laterality: Left;   RADIOLOGY WITH ANESTHESIA Left 04/18/2021   Procedure: MRI SHOULDER WITHOUT CONTRAST WITH ANESTHESIA;  Surgeon: Radiologist, Medication, MD;  Location: [facility];  Service: Radiology;  Laterality: Left;   RADIOLOGY WITH ANESTHESIA N/A 10/03/2021   Procedure: MRI CERVICAL SPINE WITH ANESTHESIA;  Surgeon: Radiologist, Medication, MD;  Location: [facility];  Service: Radiology;  Laterality: N/A;   REVERSE SHOULDER ARTHROPLASTY Left    ROTATOR CUFF REPAIR  11/2010   Dr [name] Heck   TONSILLECTOMY     TOTAL HIP ARTHROPLASTY Right 02/24/2009   TOTAL HIP ARTHROPLASTY Left 06/2016   Dr Harlow Mares    Allergies: Allergies as of 10/04/2021   (No Known Allergies)    Medications: Current Meds  Medication Sig   acetaminophen (TYLENOL) 500 MG tablet Take 1,000 mg by mouth in the morning, at noon, and at bedtime.   Ascorbic Acid (VITA-C PO) Take 1,000 mg by mouth daily.   cetirizine (ZYRTEC) 10 MG tablet Take 10 mg by mouth at bedtime.   CRANBERRY PO Take 500 mg by mouth at bedtime.   cyclobenzaprine (FLEXERIL) 10 MG tablet TAKE 1 TABLET BY MOUTH EVERY DAY AT NIGHT AS NEEDED (Patient taking differently: Take 10 mg by mouth at bedtime. TAKE 1 TABLET BY MOUTH EVERY DAY AT NIGHT AS NEEDED)   levothyroxine (SYNTHROID) 88 MCG tablet TAKE 1 TABLET BY MOUTH EVERY DAY (Patient taking differently: Take 88 mcg by mouth daily before breakfast.)   lisinopril (ZESTRIL) 20 MG tablet TAKE 1 TABLET BY MOUTH EVERY DAY   loratadine (CLARITIN) 10 MG tablet Take 10 mg by mouth in the morning and at bedtime.   meloxicam (MOBIC) 15 MG tablet Take  1 tablet (15 mg total) by mouth daily.   metoprolol succinate (TOPROL-XL) 100 MG 24 hr tablet TAKE 1 TABLET BY MOUTH EVERY DAY WITH OR IMMEDIATELY FOLLOWING A MEAL (Patient taking differently: Take 100 mg by mouth daily.)   Multiple Vitamin (MULTIVITAMIN) tablet Take 1 tablet by mouth daily.   potassium chloride (KLOR-CON) 10 MEQ tablet TAKE 1 TABLET BY MOUTH EVERY DAY (Patient taking differently: Take 10 mEq by mouth at bedtime.)   rosuvastatin (CRESTOR) 5 MG tablet TAKE 1 TABLET (5 MG TOTAL) BY MOUTH DAILY.   spironolactone (ALDACTONE) 25 MG  tablet TAKE 1/2 TABLET BY MOUTH EVERY DAY   tamsulosin (FLOMAX) 0.4 MG CAPS capsule TAKE 1 CAPSULE BY MOUTH EVERY DAY   torsemide (DEMADEX) 10 MG tablet TAKE 1 TABLET BY MOUTH EVERY DAY    Social History: Social History   Tobacco Use   Smoking status: Never   Smokeless tobacco: Never  Vaping Use   Vaping Use: Never used  Substance Use Topics   Alcohol use: Not Currently    Comment: Alcohol once every few weeks/months   Drug use: No    Family Medical History: Family History  Problem Relation Age of Onset   Hypertension Mother    Stroke Mother    Gout Father    Heart attack Father 45   Heart disease Sister    Colon cancer Neg Hx    Esophageal cancer Neg Hx    Rectal cancer Neg Hx    Stomach cancer Neg Hx     Physical Examination: Vitals:   10/05/21 0606 10/05/21 0926  BP: (!) 162/87 (!) 146/86  Pulse: 84 90  Resp: 17 17  Temp:  97.6 F (36.4 C)  SpO2: 99% 96%    General: Patient is well developed, well nourished, calm, collected, and in no apparent distress. Attention to examination is appropriate.  Neck:   Supple.  Limited range of motion.  He has somewhat limited rotation to the right and extension of his neck.  Respiratory: Patient is breathing without any difficulty.   NEUROLOGICAL:     Awake, alert, oriented to person, place, and time.  Speech is clear and fluent. Fund of knowledge is appropriate.   Cranial Nerves: Pupils equal round and reactive to light.  Facial tone is symmetric.  Facial sensation is symmetric. Shoulder shrug is symmetric. Tongue protrusion is midline.  There is no pronator drift.    Strength: Side Biceps Triceps Deltoid Interossei Grip Wrist Ext. Wrist Flex.  R '5 5 5 5 5 5 5  '$ L 5 4- 4- '5 5 5 5   '$ Side Iliopsoas Quads Hamstring PF DF EHL  R '5 5 5 5 5 5  '$ L '5 5 5 5 5 5   '$ Reflexes are 1+ and symmetric at the biceps, triceps, brachioradialis, patella and achilles.   Hoffman's is absent.  Clonus is not present.  Toes are  down-going.  Bilateral upper and lower extremity sensation is intact to light touch with exception of left C6 and 7 distributions which appear to be altered.    No evidence of dysmetria noted.  Gait is normal.     Medical Decision Making  Imaging: MRI C spine 10/03/21 IMPRESSION: Multifocal abnormal marrow signal with relatively diffuse involvement of C4 into the thoracic spine. Associated epidural disease, greatest at C6 where there is marked canal stenosis without cord compression. Left greater than right foraminal effacement due to extraosseous disease.   May reflect metastatic disease or marrow infiltrative neoplastic process.  These results will be called to the ordering clinician or representative by the Radiologist Assistant, and communication documented in the PACS or [employer].     Electronically Signed   By: Macy Mis M.D.   On: 10/03/2021 10:28  CT CAP 10/05/21 IMPRESSION: 1. Moderate severity retroperitoneal and pelvic lymphadenopathy, consistent with metastatic disease. 2. Small lytic areas at the levels of L4 and S1, with diffusely sclerotic changes involving the second and fourth right ribs, sacrum and left iliac bone. 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 Further evaluation with a whole body nuclear medicine bone scan is recommended. 3. 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 4. Sigmoid diverticulosis. 5. Aortic atherosclerosis.   Aortic Atherosclerosis (ICD10-I70.0).     Electronically Signed   By: Virgina Norfolk M.D.   On: 10/05/2021 01:56    I have personally reviewed the images and agree with the above interpretation.  Assessment and Plan: Mr. Outman is a pleasant 66 y.o. 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  At this point he does not have a tissue diagnosis.  He does have severe compression of  the spinal cord between C5 and C7.  There is a dorsal epidural component which compresses the spinal cord as well as the left-sided nerve roots that are consistent with the symptoms.  I discussed at length with the patient and his wife that the first order business is to get a tissue diagnosis.  This may be obtained by open surgery or by interventional radiology.  I will discuss with the oncologist regarding the most appropriate step forward.  That may involve additional imaging such as a PET/CT depending on her evaluation.  I spent a total of 60 minutes in face-to-face and non-face-to-face activities related to this patient's care today.  Thank you for involving me in the care of this patient.      Kelechi Orgeron K. Izora Ribas MD, [facility] Neurosurgery   Addendum to my prior note:  I spoke with Dr. Tasia Catchings, who has reviewed the case.  We discussed the rationale behind surgical intervention, which we have recommended to the patient due to his weakness, severe stenosis, and need for tissue diagnosis.  The goals of surgery are decompression and stabilization of the spinal cord, as well as obtaining tissue for diagnosis.    I will order a CT to finalize surgical planning.  I discussed the planned procedure at length with the patient and his wife, including the risks, benefits, alternatives, and indications. The risks discussed include but are not limited to bleeding, infection, need for reoperation, spinal fluid leak, stroke, vision loss, anesthetic complication, coma, paralysis, and even death. I also described in detail that improvement was not guaranteed.  The patient expressed understanding of these risks, and asked that we proceed with surgery. I described the surgery in layman's terms, and gave ample opportunity for questions, which were answered to the best of my ability.

## 2021-10-06 ENCOUNTER — Inpatient Hospital Stay: Payer: BC Managed Care – PPO

## 2021-10-06 ENCOUNTER — Other Ambulatory Visit: Payer: Self-pay

## 2021-10-06 ENCOUNTER — Encounter
Admission: EM | Disposition: A | Discharge status: Rehabilitation Facility | Payer: Self-pay | Source: Ambulatory Visit | Attending: Internal Medicine

## 2021-10-06 ENCOUNTER — Inpatient Hospital Stay: Payer: BC Managed Care – PPO | Admitting: Certified Registered Nurse Anesthetist

## 2021-10-06 ENCOUNTER — Encounter: Payer: Self-pay | Admitting: Neurosurgery

## 2021-10-06 DIAGNOSIS — R972 Elevated prostate specific antigen [PSA]: Secondary | ICD-10-CM | POA: Diagnosis not present

## 2021-10-06 DIAGNOSIS — Z7189 Other specified counseling: Secondary | ICD-10-CM | POA: Diagnosis not present

## 2021-10-06 DIAGNOSIS — R9389 Abnormal findings on diagnostic imaging of other specified body structures: Secondary | ICD-10-CM | POA: Diagnosis not present

## 2021-10-06 DIAGNOSIS — C7951 Secondary malignant neoplasm of bone: Secondary | ICD-10-CM

## 2021-10-06 HISTORY — PX: POSTERIOR CERVICAL FUSION/FORAMINOTOMY: SHX5038

## 2021-10-06 SURGERY — POSTERIOR CERVICAL FUSION/FORAMINOTOMY LEVEL 3
Anesthesia: General | Site: Spine Cervical

## 2021-10-06 MED ORDER — ONDANSETRON HCL 4 MG/2ML IJ SOLN
INTRAMUSCULAR | Status: DC | PRN
  Administered 2021-10-06: 4 mg via INTRAVENOUS

## 2021-10-06 MED ORDER — REMIFENTANIL HCL 1 MG IV SOLR
INTRAVENOUS | Status: AC
  Filled 2021-10-06: qty 1000

## 2021-10-06 MED ORDER — CEFAZOLIN SODIUM-DEXTROSE 2-3 GM-%(50ML) IV SOLR
INTRAVENOUS | Status: DC | PRN
  Administered 2021-10-06: 2 g via INTRAVENOUS

## 2021-10-06 MED ORDER — EPHEDRINE SULFATE (PRESSORS) 50 MG/ML IJ SOLN
INTRAMUSCULAR | Status: DC | PRN
  Administered 2021-10-06: 5 mg via INTRAVENOUS
  Administered 2021-10-06 (×2): 10 mg via INTRAVENOUS

## 2021-10-06 MED ORDER — LACTATED RINGERS IV SOLN: INTRAVENOUS | Status: DC | PRN

## 2021-10-06 MED ORDER — ACETAMINOPHEN 10 MG/ML IV SOLN
INTRAVENOUS | Status: AC
  Filled 2021-10-06: qty 100

## 2021-10-06 MED ORDER — BACITRACIN 500 UNIT/GM EX OINT
TOPICAL_OINTMENT | CUTANEOUS | Status: DC | PRN
  Administered 2021-10-06: 1 via TOPICAL

## 2021-10-06 MED ORDER — PHENYLEPHRINE 80 MCG/ML (10ML) SYRINGE FOR IV PUSH (FOR BLOOD PRESSURE SUPPORT)
PREFILLED_SYRINGE | INTRAVENOUS | Status: DC | PRN
  Administered 2021-10-06 (×3): 160 ug via INTRAVENOUS
  Administered 2021-10-06 (×2): 80 ug via INTRAVENOUS

## 2021-10-06 MED ORDER — HYDROMORPHONE HCL 1 MG/ML IJ SOLN: 0.5000 mg | INTRAMUSCULAR | Status: DC | PRN

## 2021-10-06 MED ORDER — OXYCODONE-ACETAMINOPHEN 5-325 MG PO TABS
1.0000 | ORAL_TABLET | ORAL | Status: DC | PRN
  Administered 2021-10-06: 2 via ORAL
  Filled 2021-10-06: qty 2

## 2021-10-06 MED ORDER — FENTANYL CITRATE (PF) 100 MCG/2ML IJ SOLN
INTRAMUSCULAR | Status: AC
  Filled 2021-10-06: qty 2

## 2021-10-06 MED ORDER — MIDAZOLAM HCL 2 MG/2ML IJ SOLN
INTRAMUSCULAR | Status: AC
  Filled 2021-10-06: qty 2

## 2021-10-06 MED ORDER — ONDANSETRON HCL 4 MG/2ML IJ SOLN: 4.0000 mg | Freq: Once | INTRAMUSCULAR | Status: DC | PRN

## 2021-10-06 MED ORDER — 0.9 % SODIUM CHLORIDE (POUR BTL) OPTIME
TOPICAL | Status: DC | PRN
  Administered 2021-10-06 (×2): 500 mL

## 2021-10-06 MED ORDER — ACETAMINOPHEN 10 MG/ML IV SOLN: 1000.0000 mg | Freq: Once | INTRAVENOUS | Status: DC | PRN

## 2021-10-06 MED ORDER — LIDOCAINE HCL (CARDIAC) PF 100 MG/5ML IV SOSY
PREFILLED_SYRINGE | INTRAVENOUS | Status: DC | PRN
  Administered 2021-10-06: 100 mg via INTRAVENOUS

## 2021-10-06 MED ORDER — BUPIVACAINE HCL (PF) 0.5 % IJ SOLN
INTRAMUSCULAR | Status: DC | PRN
  Administered 2021-10-06: 20 mL

## 2021-10-06 MED ORDER — MIDAZOLAM HCL 2 MG/2ML IJ SOLN
INTRAMUSCULAR | Status: DC | PRN
  Administered 2021-10-06: 2 mg via INTRAVENOUS

## 2021-10-06 MED ORDER — SURGIFLO WITH THROMBIN (HEMOSTATIC MATRIX KIT) OPTIME
TOPICAL | Status: DC | PRN
  Administered 2021-10-06: 1 via TOPICAL

## 2021-10-06 MED ORDER — FENTANYL CITRATE (PF) 100 MCG/2ML IJ SOLN
INTRAMUSCULAR | Status: DC | PRN
Start: 2021-10-06 — End: 2021-10-06
  Administered 2021-10-06 (×3): 50 ug via INTRAVENOUS

## 2021-10-06 MED ORDER — DEXAMETHASONE SODIUM PHOSPHATE 10 MG/ML IJ SOLN
INTRAMUSCULAR | Status: DC | PRN
  Administered 2021-10-06: 10 mg via INTRAVENOUS

## 2021-10-06 MED ORDER — ACETAMINOPHEN 10 MG/ML IV SOLN
INTRAVENOUS | Status: DC | PRN
  Administered 2021-10-06: 1000 mg via INTRAVENOUS

## 2021-10-06 MED ORDER — SODIUM CHLORIDE (PF) 0.9 % IJ SOLN
INTRAMUSCULAR | Status: AC
  Filled 2021-10-06: qty 60

## 2021-10-06 MED ORDER — VASOPRESSIN 20 UNIT/ML IV SOLN
INTRAVENOUS | Status: AC
  Filled 2021-10-06: qty 1

## 2021-10-06 MED ORDER — PROPOFOL 500 MG/50ML IV EMUL
INTRAVENOUS | Status: DC | PRN
  Administered 2021-10-06: 150 ug/kg/min via INTRAVENOUS

## 2021-10-06 MED ORDER — REMIFENTANIL HCL 1 MG IV SOLR
INTRAVENOUS | Status: DC | PRN
  Administered 2021-10-06: .1 ug/kg/min via INTRAVENOUS

## 2021-10-06 MED ORDER — CEFAZOLIN SODIUM-DEXTROSE 2-4 GM/100ML-% IV SOLN
INTRAVENOUS | Status: AC
  Filled 2021-10-06: qty 100

## 2021-10-06 MED ORDER — OXYCODONE HCL 5 MG/5ML PO SOLN: 5.0000 mg | Freq: Once | ORAL | Status: DC | PRN

## 2021-10-06 MED ORDER — SUCCINYLCHOLINE CHLORIDE 200 MG/10ML IV SOSY
PREFILLED_SYRINGE | INTRAVENOUS | Status: DC | PRN
  Administered 2021-10-06: 100 mg via INTRAVENOUS

## 2021-10-06 MED ORDER — FENTANYL CITRATE (PF) 100 MCG/2ML IJ SOLN
25.0000 ug | INTRAMUSCULAR | Status: DC | PRN
  Administered 2021-10-06: 25 ug via INTRAVENOUS

## 2021-10-06 MED ORDER — SODIUM CHLORIDE 0.9 % IV SOLN: INTRAVENOUS | Status: DC | PRN

## 2021-10-06 MED ORDER — BUPIVACAINE-EPINEPHRINE (PF) 0.5% -1:200000 IJ SOLN
INTRAMUSCULAR | Status: DC | PRN
  Administered 2021-10-06: 10 mL via PERINEURAL

## 2021-10-06 MED ORDER — PROPOFOL 1000 MG/100ML IV EMUL
INTRAVENOUS | Status: AC
  Filled 2021-10-06: qty 100

## 2021-10-06 MED ORDER — ACETAMINOPHEN 10 MG/ML IV SOLN: INTRAVENOUS | Status: DC | PRN

## 2021-10-06 MED ORDER — DEGARELIX ACETATE(240 MG DOSE) 120 MG/VIAL ~~LOC~~ SOLR
240.0000 mg | Freq: Once | SUBCUTANEOUS | Status: DC
  Filled 2021-10-06 (×2): qty 6

## 2021-10-06 MED ORDER — PROPOFOL 10 MG/ML IV BOLUS
INTRAVENOUS | Status: DC | PRN
  Administered 2021-10-06: 50 mg via INTRAVENOUS
  Administered 2021-10-06: 100 mg via INTRAVENOUS
  Administered 2021-10-06 (×2): 50 mg via INTRAVENOUS

## 2021-10-06 MED ORDER — OXYCODONE HCL 5 MG PO TABS: 5.0000 mg | ORAL_TABLET | Freq: Once | ORAL | Status: DC | PRN

## 2021-10-06 MED ORDER — SODIUM CHLORIDE 0.9% FLUSH
INTRAVENOUS | Status: DC | PRN
  Administered 2021-10-06: 20 mL via INTRAVENOUS

## 2021-10-06 MED ORDER — SODIUM CHLORIDE 0.9 % IV SOLN
INTRAVENOUS | Status: DC | PRN
  Administered 2021-10-06: 40 mL

## 2021-10-06 MED ORDER — PHENYLEPHRINE HCL-NACL 20-0.9 MG/250ML-% IV SOLN
INTRAVENOUS | Status: AC
  Filled 2021-10-06: qty 250

## 2021-10-06 MED ORDER — CEFAZOLIN SODIUM-DEXTROSE 2-4 GM/100ML-% IV SOLN
2.0000 g | Freq: Once | INTRAVENOUS | Status: AC
  Administered 2021-10-07: 2 g via INTRAVENOUS
  Filled 2021-10-06: qty 100

## 2021-10-06 MED ORDER — PHENYLEPHRINE HCL-NACL 20-0.9 MG/250ML-% IV SOLN
INTRAVENOUS | Status: DC | PRN
  Administered 2021-10-06: 40 ug/min via INTRAVENOUS

## 2021-10-06 MED ORDER — VASOPRESSIN 20 UNIT/ML IV SOLN
INTRAVENOUS | Status: DC | PRN
  Administered 2021-10-06 (×6): 1 [IU] via INTRAVENOUS

## 2021-10-06 SURGICAL SUPPLY — 80 items
"PENCIL ELECTRO HAND CTR " (MISCELLANEOUS) IMPLANT
ADH SKN CLS APL DERMABOND .7 (GAUZE/BANDAGES/DRESSINGS) ×1
AGENT HMST KT MTR STRL THRMB (HEMOSTASIS) ×1
APL PRP STRL LF DISP 70% ISPRP (MISCELLANEOUS) ×1
BASIN KIT SINGLE STR (MISCELLANEOUS) ×2 IMPLANT
BLADE SURG 15 STRL LF DISP TIS (BLADE) ×1 IMPLANT
BLADE SURG 15 STRL SS (BLADE) ×2
BULB RESERV EVAC DRAIN JP 100C (MISCELLANEOUS) IMPLANT
BUR NEURO DRILL SOFT 3.0X3.8M (BURR) ×2 IMPLANT
CHLORAPREP W/TINT 26 (MISCELLANEOUS) ×2 IMPLANT
CNTNR SPEC 2.5X3XGRAD LEK (MISCELLANEOUS) ×1
CONT SPEC 4OZ STER OR WHT (MISCELLANEOUS) ×2
CONT SPEC 4OZ STRL OR WHT (MISCELLANEOUS) ×1
CONTAINER SPEC 2.5X3XGRAD LEK (MISCELLANEOUS) IMPLANT
COUNTER NEEDLE 20/40 LG (NEEDLE) ×2 IMPLANT
CUP MEDICINE 2OZ PLAST GRAD ST (MISCELLANEOUS) ×4 IMPLANT
DERMABOND ADVANCED (GAUZE/BANDAGES/DRESSINGS) ×2
DERMABOND ADVANCED .7 DNX12 (GAUZE/BANDAGES/DRESSINGS) ×1 IMPLANT
DRAIN CHANNEL JP 10F RND 20C F (MISCELLANEOUS) IMPLANT
DRAPE C ARM PK CFD 31 SPINE (DRAPES) ×2 IMPLANT
DRAPE LAPAROTOMY 100X77 ABD (DRAPES) ×2 IMPLANT
DRAPE MICROSCOPE SPINE 48X150 (DRAPES) IMPLANT
DRAPE SURG 17X11 SM STRL (DRAPES) ×2 IMPLANT
DRSG OPSITE POSTOP 4X6 (GAUZE/BANDAGES/DRESSINGS) ×1 IMPLANT
DRSG TEGADERM 4X4.75 (GAUZE/BANDAGES/DRESSINGS) ×1 IMPLANT
DRSG TELFA 3X4 N-ADH STERILE (GAUZE/BANDAGES/DRESSINGS) ×1 IMPLANT
ELECT CAUTERY BLADE TIP 2.5 (TIP) ×2
ELECTRODE CAUTERY BLDE TIP 2.5 (TIP) ×1 IMPLANT
FEE INTRAOP CADWELL SUPPLY NCS (MISCELLANEOUS) IMPLANT
FEE INTRAOP MONITOR IMPULS NCS (MISCELLANEOUS) IMPLANT
GAUZE SPONGE 4X4 12PLY STRL (GAUZE/BANDAGES/DRESSINGS) ×2 IMPLANT
GLOVE BIOGEL PI IND STRL 6.5 (GLOVE) ×1 IMPLANT
GLOVE BIOGEL PI IND STRL 8.5 (GLOVE) ×1 IMPLANT
GLOVE BIOGEL PI INDICATOR 6.5 (GLOVE) ×2
GLOVE BIOGEL PI INDICATOR 8.5 (GLOVE) ×2
GLOVE SURG SYN 6.5 ES PF (GLOVE) ×2 IMPLANT
GLOVE SURG SYN 6.5 PF PI (GLOVE) ×1 IMPLANT
GLOVE SURG SYN 8.5  E (GLOVE) ×6
GLOVE SURG SYN 8.5 E (GLOVE) ×3 IMPLANT
GLOVE SURG SYN 8.5 PF PI (GLOVE) ×3 IMPLANT
GOWN SRG LRG LVL 4 IMPRV REINF (GOWNS) ×1 IMPLANT
GOWN SRG XL LVL 3 NONREINFORCE (GOWNS) ×1 IMPLANT
GOWN STRL NON-REIN TWL XL LVL3 (GOWNS) ×2
GOWN STRL REIN LRG LVL4 (GOWNS) ×2
GRADUATE 1200CC STRL 31836 (MISCELLANEOUS) ×2 IMPLANT
HEMOVAC 400CC 10FR (MISCELLANEOUS) ×1 IMPLANT
HOLDER FOLEY CATH W/STRAP (MISCELLANEOUS) ×2 IMPLANT
INTRAOP CADWELL SUPPLY FEE NCS (MISCELLANEOUS) ×1
INTRAOP DISP SUPPLY FEE NCS (MISCELLANEOUS) ×2
INTRAOP MONITOR FEE IMPULS NCS (MISCELLANEOUS) ×1
INTRAOP MONITOR FEE IMPULSE (MISCELLANEOUS) ×2
KIT PREVENA INCISION MGT 13 (CANNISTER) ×1 IMPLANT
KIT TURNOVER KIT A (KITS) ×2 IMPLANT
MANIFOLD NEPTUNE II (INSTRUMENTS) ×2 IMPLANT
MARKER SKIN DUAL TIP RULER LAB (MISCELLANEOUS) ×4 IMPLANT
NDL SAFETY ECLIP 18X1.5 (MISCELLANEOUS) ×1 IMPLANT
NDL SPNL 18GX3.5 QUINCKE PK (NEEDLE) ×3 IMPLANT
NEEDLE SPNL 18GX3.5 QUINCKE PK (NEEDLE) ×6 IMPLANT
NS IRRIG 1000ML POUR BTL (IV SOLUTION) ×2 IMPLANT
PACK LAMINECTOMY NEURO (CUSTOM PROCEDURE TRAY) ×2 IMPLANT
PAD ARMBOARD 7.5X6 YLW CONV (MISCELLANEOUS) ×4 IMPLANT
PENCIL ELECTRO HAND CTR (MISCELLANEOUS) ×1 IMPLANT
PIN MAYFIELD SKULL DISP (PIN) ×2 IMPLANT
REMOVER STAPLE SKIN (DISPOSABLE) ×1 IMPLANT
SOLUTION IRRIG SURGIPHOR (IV SOLUTION) ×2 IMPLANT
SPONGE DRAIN TRACH 4X4 STRL 2S (GAUZE/BANDAGES/DRESSINGS) ×1 IMPLANT
STAPLER SKIN PROX 35W (STAPLE) ×7 IMPLANT
SURGIFLO W/THROMBIN 8M KIT (HEMOSTASIS) ×2 IMPLANT
SUT ETHILON 3-0 FS-10 30 BLK (SUTURE) ×2
SUT V-LOC 90 ABS DVC 3-0 CL (SUTURE) ×2 IMPLANT
SUT VIC AB 0 CT1 27 (SUTURE) ×6
SUT VIC AB 0 CT1 27XCR 8 STRN (SUTURE) ×3 IMPLANT
SUT VIC AB 2-0 CT1 18 (SUTURE) ×4 IMPLANT
SUTURE EHLN 3-0 FS-10 30 BLK (SUTURE) IMPLANT
SYR 30ML LL (SYRINGE) ×6 IMPLANT
TAPE CLOTH 3X10 WHT NS LF (GAUZE/BANDAGES/DRESSINGS) ×4 IMPLANT
TOWEL OR 17X26 4PK STRL BLUE (TOWEL DISPOSABLE) ×8 IMPLANT
TRAP FLUID SMOKE EVACUATOR (MISCELLANEOUS) ×2 IMPLANT
TRAY FOLEY MTR SLVR 16FR STAT (SET/KITS/TRAYS/PACK) IMPLANT
TUBING CONNECTING 10 (TUBING) ×2 IMPLANT

## 2021-10-06 NOTE — Transfer of Care (Signed)
Immediate Anesthesia Transfer of Care Note  Patient: Jason [name]  Procedure(s) Performed: POSTERIOR CERVICAL FUSION/ FORAMINOTOMY LEVEL 3 (Spine Cervical)  Patient Location: PACU  Anesthesia Type:General  Level of Consciousness: drowsy  Airway & Oxygen Therapy: Patient Spontanous Breathing and Patient connected to face mask oxygen  Post-op Assessment: Report given to RN and Post -op Vital signs reviewed and stable  Post vital signs: Reviewed and stable  Last Vitals:  Vitals Value Taken Time  BP 147/88   Temp    Pulse 91 10/06/21 1042  Resp 12   SpO2 100 % 10/06/21 1042  Vitals shown include unvalidated device data.  Last Pain:  Vitals:   10/06/21 0148  TempSrc:   PainSc: Asleep         Complications: No notable events documented.

## 2021-10-06 NOTE — Op Note (Addendum)
Indications: Mr. Liskey is a 66 yo male who presented with metastatic epidural spinal cord compression causing left arm weakness and severe cervical stenosis.  Due to weakness and compression, surgical decompression and tumor resection were recommended.  Findings: epidural tumor  Preoperative Diagnosis: see above Postoperative Diagnosis: same   EBL: 150 ml IVF: 1600 ml Drains: 1 placed Disposition: Extubated and Stable to PACU Complications: none  No foley catheter was placed.   Preoperative Note:   Risks of surgery discussed include: infection, bleeding, stroke, coma, death, paralysis, CSF leak, nerve/spinal cord injury, numbness, tingling, weakness, complex regional pain syndrome, recurrent stenosis and/or disc herniation, vascular injury, development of instability, neck/back pain, need for further surgery, persistent symptoms, development of deformity, and the risks of anesthesia. The patient understood these risks and agreed to proceed.  Operative Note:   OPERATIVE PROCEDURE:  1. Posterior cervical laminectomy C6 for resection of epidural spinal tumor 2. Use of flouroscopy   OPERATIVE PROCEDURE:  After induction of general anesthesia, the Mayfield was placed. The patient was placed in the prone position on the operative table and the head secured with the Mayfield.  A midline incision was then planned using fluoroscopy.  A timeout was performed, and antibiotics given.  Next, the posterior cervical region was prepped and draped in the usual sterile fashion. The incision was injected with local anesthetic, the opened sharply. A subperiosteal dissection was then carried out to expose the posterior elements from C5 to C7.  Fluoroscopy was used to confirm localization.  There was obvious abnormality in the C6 lamina on opening with likely neoplastic involvement of the lamina.  At this point, the C6 lamina and spinous process were isolated.  The high-speed drill was used to disconnect  the lamina bilaterally at C6.  This was then removed en bloc and handed off for pathology.  The ligamentum flavum was identified, disconnected, and removed.  There was significant epidural spinal cord tumor noted with significant adherence to the dura.  This was carefully dissected free and then removed in piecemeal fashion until normal-appearing dura was present.  The 2 mm Kerrison punch was used to undercut the C7 and C5 lamina until normal dura was encountered both inferiorly and superiorly to the C6 lamina.  The 1 and 2 mm punches were then used to decompress the left C6 and C7 nerve roots until no obvious tumor related compression was identified.  The epidural tumor was handed off for pathology.  After tumor resection was completed, final motor evoked potential was performed which showed stable findings.  We then transitioned to achieving hemostasis.  The wound was copiously irrigated  and hemostasis was achieved.  A Hemovac drain was then placed in the wound deep to the fascia.   The wound was closed in a multilayer fashion using interrupted 0 and 2-0 Vicryl sutures.  The final skin edges were reapproximated using staples.  Sterile dressing was placed.  After closure, the patient was flipped supine and the Mayfield removed.  Patient was then handed back over to anesthesia.  All counts were correct at the conclusion of the procedure.  Neurological monitoring was used throughout, and there were no changes.    Meade Maw MD

## 2021-10-06 NOTE — Anesthesia Postprocedure Evaluation (Signed)
Anesthesia Post Note  Patient: Jason [name]  Procedure(s) Performed: POSTERIOR CERVICAL FUSION/ FORAMINOTOMY LEVEL 3 (Spine Cervical)  Patient location during evaluation: PACU Anesthesia Type: General Level of consciousness: awake and alert Pain management: pain level controlled Vital Signs Assessment: post-procedure vital signs reviewed and stable Respiratory status: spontaneous breathing, nonlabored ventilation, respiratory function stable and patient connected to nasal cannula oxygen Cardiovascular status: blood pressure returned to baseline and stable Postop Assessment: no apparent nausea or vomiting Anesthetic complications: no   No notable events documented.   Last Vitals:  Vitals:   10/06/21 1128 10/06/21 1200  BP: (!) 164/90 (!) 158/94  Pulse: 91 94  Resp: 13 18  Temp:  36.6 C  SpO2: 97% 95%    Last Pain:  Vitals:   10/06/21 1200  TempSrc:   PainSc: 0-No pain                 Arita Miss

## 2021-10-06 NOTE — Anesthesia Procedure Notes (Signed)
Procedure Name: Intubation Date/Time: 10/06/2021 8:10 AM  Performed by: Lily Peer, Niyana Chesbro, CRNAPre-anesthesia Checklist: Patient identified, Emergency Drugs available, Suction available and Patient being monitored Patient Re-evaluated:Patient Re-evaluated prior to induction Oxygen Delivery Method: Circle system utilized Preoxygenation: Pre-oxygenation with 100% oxygen Induction Type: IV induction Ventilation: Mask ventilation with difficulty and Two handed mask ventilation required Laryngoscope Size: McGraph and 4 Grade View: Grade I Tube type: Oral Tube size: 7.0 mm Number of attempts: 1 Airway Equipment and Method: Stylet Placement Confirmation: ETT inserted through vocal cords under direct vision, positive ETCO2 and breath sounds checked- equal and bilateral Secured at: 21 cm Tube secured with: Tape Dental Injury: Teeth and Oropharynx as per pre-operative assessment

## 2021-10-06 NOTE — Interval H&P Note (Signed)
History and Physical Interval Note:  10/06/2021 7:47 AM  Jason [name]  has presented today for surgery, with the diagnosis of metastatic epidural spinal cord compression.  The various methods of treatment have been discussed with the patient and family. After consideration of risks, benefits and other options for treatment, the patient has consented to Posterior cervical laminectomy for tumor resection, possible instrumentation as a surgical intervention.  The patient's history has been reviewed, patient examined, no change in status, stable for surgery.  I have reviewed the patient's chart and labs.  Questions were answered to the patient's satisfaction.    Heart sounds normal no MRG. Chest Clear to Auscultation Bilaterally.   Jason [name]

## 2021-10-06 NOTE — Anesthesia Preprocedure Evaluation (Signed)
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 Patient not in collar, able to walk to bathroom.  Reviewed: Allergy & Precautions, NPO status , Patient's Chart, lab work & pertinent test results  History of Anesthesia Complications Negative for: history of anesthetic complications  Airway Mallampati: II  TM Distance: >3 FB Neck ROM: Limited   Comment: Stooped posture, limited neck extension, though patient says limited by pain. Patient had similar posture for his shoulder surgery this past march, unremarkable LMA placement at that time Dental  (+) Teeth Intact   Pulmonary neg pulmonary ROS, neg sleep apnea, neg COPD, Patient abstained from smoking.Not current smoker,    Pulmonary exam normal breath sounds clear to auscultation       Cardiovascular Exercise Tolerance: Good METShypertension, Pt. on medications (-) CAD and (-) Past MI (-) dysrhythmias  Rhythm:Regular Rate:Normal - Systolic murmurs    Neuro/Psych negative neurological ROS  negative psych ROS   GI/Hepatic GERD  ,(+)     (-) substance abuse  ,   Endo/Other  neg diabetesHypothyroidism   Renal/GU negative Renal ROS     Musculoskeletal  (+) Arthritis ,   Abdominal   Peds  Hematology   Anesthesia Other Findings Past Medical History: No date: Allergy No date: Arthritis     Comment:  osteoarthritis No date: GERD (gastroesophageal reflux disease) No date: Hypertension No date: Hypothyroidism No date: Thyroid disease     Comment:  Hyperthyroidism s/p radioactive iodine ablation  Reproductive/Obstetrics                             Anesthesia Physical Anesthesia Plan  ASA: 3  Anesthesia Plan: General   Post-op Pain Management: Ofirmev IV (intra-op)* and  Dilaudid IV   Induction: Intravenous  PONV Risk Score and Plan: 3 and Ondansetron, Dexamethasone, Propofol infusion, TIVA and Midazolam  Airway Management Planned: Oral ETT and Video Laryngoscope Planned  Additional Equipment: None  Intra-op Plan:   Post-operative Plan: Extubation in OR  Informed Consent: I have reviewed the patients History and Physical, chart, labs and discussed the procedure including the risks, benefits and alternatives for the proposed anesthesia with the patient or authorized representative who has indicated his/her understanding and acceptance.     Dental advisory given  Plan Discussed with: CRNA and Surgeon  Anesthesia Plan Comments: (Good airway, plan for video laryngoscopy with manual in line stabilization. Discussed risks of anesthesia with patient, including PONV, sore throat, lip/dental/eye damage. Rare risks discussed as well, such as cardiorespiratory and neurological sequelae, and allergic reactions. Discussed the role of CRNA in patient's perioperative care. Patient understands.)        Anesthesia Quick Evaluation

## 2021-10-06 NOTE — Consult Note (Signed)
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  History of presenting illness-  66 y.o. male with PMH listed at below who presents to ER for evaluation of neck pain/left shoulder pain. Neck pain is chronic since last December and progressively getting worse.  Associated with left hand thumb and index paresthesia.  10/03/2021, MRI cervical spine without contrast showed multifocal abnormal marrow signals with relatively diffuse involvement of C4 into the thoracic spine.  Associated epidural disease greater at the C6 where there is marked canal stenosis without cord compression.  Left greater than right foraminal effacement due to the extraosseous disease. 10/03/2021, MRI left shoulder showed High-riding humeral head with massive full-thickness tear of the entire supraspinatus tendon and the anterior 50% of the infraspinatus tendon. Additional partial-thickness articular sided tearing of the posterior infraspinatus. Moderate to high-grade supraspinatus and infraspinatus muscle atrophy suggests these tears  are chronic. Partial-thickness tearing of the superior greater than inferior  aspects of the subscapularis tendon footprint. Mild subscapularis muscle atrophy.  Moderate degenerative changes of the acromioclavicular joint. Moderate glenohumeral cartilage degenerative changes.  10/05/2021 CT chest abdomen pelvis with contrast showed  Moderate severity retroperitoneal and pelvic lymphadenopathy,consistent with metastatic disease. Small lytic areas at the levels of L4 and S1, with diffusely sclerotic changes involving the second and fourth right ribs, sacrum and left iliac bone. These findings  are likely consistent with osseous metastasis. 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  Sigmoid diverticulosis. Aortic atherosclerosis.  10/05/2021 CT cervical spine without contrast Extensive heterogeneous sclerotic lesions throughout the cervical spine compatible with metastatic disease to the bone.  Mixed sclerotic and lytic changes in the posterior elements at C5 and C6.Extraosseous tumor at C5 and C6 is better appreciated on the MRI scan.  Neurosurgery and oncology were consulted for further evaluation.    No Known Allergies  Patient Active Problem List   Diagnosis Date Noted   Cervical spinal stenosis 10/05/2021   Metastatic disease ([place]) 10/05/2021   Hypertensive urgency 10/05/2021   Left shoulder pain 03/29/2021   PSVT (paroxysmal supraventricular tachycardia) ([place]) 01/25/2021   NSVT (nonsustained ventricular tachycardia) ([place]) 01/25/2021   Hyperlipidemia 01/25/2021   Acute non-recurrent frontal sinusitis 12/27/2020   PVC's (premature ventricular contractions) 03/15/2020   Palpitations 03/14/2020   BPH (benign prostatic hyperplasia) 11/07/2013   Routine general medical examination at a health care facility 06/24/2010   Hypothyroidism 06/19/2009   OSTEOARTHRITIS 12/19/2008   BACK PAIN, LUMBAR 02/26/2007   Essential hypertension 09/17/2006   ALLERGIC RHINITIS 09/17/2006   GERD 09/17/2006     Past Medical History:  Diagnosis Date   Allergy    Arthritis    osteoarthritis   GERD (gastroesophageal reflux disease)    Hypertension    Hypothyroidism    Thyroid disease    Hyperthyroidism s/p radioactive iodine ablation     Past Surgical History:  Procedure Laterality Date   CARDIAC CATHETERIZATION  03/2000   HERNIA REPAIR  [id number]   umbilical   LEFT HEART CATH AND CORONARY ANGIOGRAPHY Left 03/20/2020   Procedure: LEFT HEART CATH AND CORONARY  ANGIOGRAPHY;  Surgeon: Nelva Bush, MD;  Location:  [facility];  Service: Cardiovascular;  Laterality: Left;   RADIOLOGY WITH ANESTHESIA Left 04/18/2021   Procedure: MRI SHOULDER WITHOUT CONTRAST WITH ANESTHESIA;  Surgeon: Radiologist, Medication, MD;  Location: [facility];  Service: Radiology;  Laterality: Left;   RADIOLOGY WITH ANESTHESIA N/A 10/03/2021   Procedure: MRI CERVICAL SPINE WITH ANESTHESIA;  Surgeon: Radiologist, Medication, MD;  Location: [facility];  Service: Radiology;  Laterality: N/A;   REVERSE SHOULDER ARTHROPLASTY Left    ROTATOR CUFF REPAIR  11/2010   Dr [name] Heck   TONSILLECTOMY     TOTAL HIP ARTHROPLASTY Right 02/24/2009   TOTAL HIP ARTHROPLASTY Left 06/2016   Dr Harlow Mares    Social History   Socioeconomic History   Marital status: Married    Spouse name: [name]    Number of children: 0   Years of education: Not on file   Highest education level: Not on file  Occupational History   Occupation: Therapist, occupational: [employer]  Tobacco Use   Smoking status: Never   Smokeless tobacco: Never  Vaping Use   Vaping Use: Never used  Substance and Sexual Activity   Alcohol use: Not Currently    Comment: Alcohol once every few weeks/months   Drug use: No   Sexual activity: Yes  Other Topics Concern   Not on file  Social History Narrative   Lives at home with spouse.    Social Determinants of Health   Financial Resource Strain: Not on file  Food Insecurity: Not on file  Transportation Needs: Not on file  Physical Activity: Not on file  Stress: Not on file  Social Connections: Not on file  Intimate Partner Violence: Not on file     Family History  Problem Relation Age of Onset   Hypertension Mother    Stroke Mother    Gout Father    Heart attack Father 66   Heart disease Sister    Colon cancer Neg Hx    Esophageal cancer Neg Hx    Rectal cancer Neg Hx    Stomach cancer Neg Hx      Current Facility-Administered Medications:    0.9 %   sodium chloride infusion, , Intravenous, Continuous, Mansy, Jan A, MD, Last Rate: 100 mL/hr at 10/06/21 0538, New Bag at 10/06/21 0538   0.9 % irrigation (POUR BTL), , , PRN, Meade Maw, MD, 500 mL at 10/06/21 1002   acetaminophen (OFIRMEV) IV 1,000 mg, 1,000 mg, Intravenous, Once PRN, Arita Miss, MD   [facility] Hold] acetaminophen (TYLENOL) tablet 650 mg, 650 mg, Oral, Q6H PRN, 650 mg at 10/05/21 2207 **OR** [MAR Hold] acetaminophen (TYLENOL) suppository 650 mg, 650 mg, Rectal, Q6H PRN, Mansy, Jan A, MD   [MAR Hold] ascorbic acid (VITAMIN C) tablet 500 mg, 500 mg, Oral, Daily, Mansy, Jan A, MD, 500 mg at 10/05/21 0926   bacitracin ointment, , , PRN, Meade Maw, MD, 1 Application at 35/32/99 0800   bupivacaine liposome (EXPAREL 1.3%) in normal saline Optime, , , PRN, Meade Maw, MD, 40 mL at 10/06/21 1005   bupivacaine(PF) (MARCAINE) 0.5 % injection, , , PRN, Meade Maw, MD, 20 mL at 10/06/21 1005   bupivacaine-epinephrine (PF) (MARCAINE W/ EPI) 0.5% -1:200000 injection, , , PRN, Meade Maw, MD, 10 mL at 10/06/21 0850   ceFAZolin (ANCEF) 2-4 GM/100ML-% IVPB, , , ,    [MAR Hold] cyclobenzaprine (FLEXERIL) tablet 10 mg, 10 mg, Oral, QHS, Mansy, Jan A, MD, 10 mg at 10/05/21 2208   fentaNYL (SUBLIMAZE) injection  25-50 mcg, 25-50 mcg, Intravenous, Q5 min PRN, Arita Miss, MD   HYDROmorphone (DILAUDID) injection 0.5 mg, 0.5 mg, Intravenous, Q10 min PRN, Arita Miss, MD   [facility] Hold] levothyroxine (SYNTHROID) tablet 88 mcg, 88 mcg, Oral, Q0600, Mansy, Jan A, MD, 88 mcg at 10/05/21 0611   [facility] Hold] lisinopril (ZESTRIL) tablet 20 mg, 20 mg, Oral, Daily, Mansy, Jan A, MD, 20 mg at 10/05/21 0926   [MAR Hold] loratadine (CLARITIN) tablet 10 mg, 10 mg, Oral, BID, Mansy, Jan A, MD, 10 mg at 10/05/21 2208   [facility] Hold] magnesium hydroxide (MILK OF MAGNESIA) suspension 30 mL, 30 mL, Oral, Daily PRN, Mansy, Jan A, MD, 30 mL at 10/05/21 0744   [MAR Hold] meloxicam (MOBIC) tablet 15  mg, 15 mg, Oral, Daily, Emeterio Reeve, DO, 15 mg at 10/05/21 2209   [facility] Hold] metoprolol succinate (TOPROL-XL) 24 hr tablet 100 mg, 100 mg, Oral, Daily, Mansy, Jan A, MD, 100 mg at 10/05/21 0930   [MAR Hold] multivitamin with minerals tablet 1 tablet, 1 tablet, Oral, Daily, Mansy, Jan A, MD, 1 tablet at 10/05/21 0925   [MAR Hold] ondansetron (ZOFRAN) tablet 4 mg, 4 mg, Oral, Q6H PRN **OR** [MAR Hold] ondansetron (ZOFRAN) injection 4 mg, 4 mg, Intravenous, Q6H PRN, Mansy, Jan A, MD   ondansetron (ZOFRAN) injection 4 mg, 4 mg, Intravenous, Once PRN, Arita Miss, MD   oxyCODONE (Oxy IR/ROXICODONE) immediate release tablet 5 mg, 5 mg, Oral, Once PRN **OR** oxyCODONE (ROXICODONE) 5 MG/5ML solution 5 mg, 5 mg, Oral, Once PRN, Arita Miss, MD   [facility] Hold] oxyCODONE-acetaminophen (PERCOCET/ROXICET) 5-325 MG per tablet 1 tablet, 1 tablet, Oral, Q4H PRN, Mansy, Arvella Merles, MD   [facility] Hold] potassium chloride (KLOR-CON M) CR tablet 10 mEq, 10 mEq, Oral, QHS, Mansy, Jan A, MD, 10 mEq at 10/05/21 2209   [facility] Hold] rosuvastatin (CRESTOR) tablet 5 mg, 5 mg, Oral, Daily, Mansy, Jan A, MD, 5 mg at 10/05/21 0931   sodium chloride flush (NS) 0.9 % injection, , , PRN, Meade Maw, MD, 20 mL at 10/06/21 1005   [MAR Hold] spironolactone (ALDACTONE) tablet 12.5 mg, 12.5 mg, Oral, Daily, Mansy, Jan A, MD, 12.5 mg at 10/05/21 0930   Surgiflo with Thrombin (Hemostatic Matrix Kit) Vincent Gros, , , PRN, Meade Maw, MD, 1 Application at 66/44/03 0923   [MAR Hold] tamsulosin (FLOMAX) capsule 0.4 mg, 0.4 mg, Oral, Daily, Mansy, Jan A, MD, 0.4 mg at 10/05/21 0930   [MAR Hold] torsemide (DEMADEX) tablet 10 mg, 10 mg, Oral, Daily, Mansy, Jan A, MD, 10 mg at 10/05/21 0930   [MAR Hold] traZODone (DESYREL) tablet 25 mg, 25 mg, Oral, QHS PRN, Mansy, Jan A, MD, 25 mg at 10/05/21 2208  Facility-Administered Medications Ordered in Other Encounters:    0.9 %  sodium chloride infusion, , Intravenous, Continuous PRN, Lily Peer, Summer,  CRNA, New Bag at 10/06/21 4742   acetaminophen (OFIRMEV) IV, , Intravenous, Anesthesia Intra-op, Farlow, Summer, CRNA, 1,000 mg at 10/06/21 1008   ceFAZolin (ANCEF) IVPB 2 g/50 mL premix, , Intravenous, Anesthesia Intra-op, Lily Peer, Summer, CRNA, 2 g at 10/06/21 0816   dexamethasone (DECADRON) injection, , Intravenous, Anesthesia Intra-op, Lily Peer, Summer, CRNA, 10 mg at 10/06/21 5956   ePHEDrine injection, , Intravenous, Anesthesia Intra-op, Lily Peer, Summer, CRNA, 10 mg at 10/06/21 3875   fentaNYL (SUBLIMAZE) injection, , Intravenous, Anesthesia Intra-op, Farlow, Summer, CRNA, 50 mcg at 10/06/21 1011   lactated ringers infusion, , Intravenous, Continuous PRN, Lily Peer, Summer, CRNA, Stopped at 10/06/21 1009   lidocaine (cardiac)  100 mg/41m (XYLOCAINE) injection 2%, , Intravenous, Anesthesia Intra-op, Farlow, Summer, CRNA, 100 mg at 10/06/21 04196  midazolam (VERSED) injection, , Intravenous, Anesthesia Intra-op, FLily Peer Summer, CRNA, 2 mg at 10/06/21 0757   ondansetron (ZOFRAN) injection, , Intravenous, Anesthesia Intra-op, FLily Peer Summer, CRNA, 4 mg at 10/06/21 1008   phenylephrine (NEO-SYNEPHRINE) 248mNS 25032mremix infusion, , Intravenous, Continuous PRN, FarLily Peerummer, CRNA, Stopped at 10/06/21 1028   PHENYLephrine 80 mcg/ml in normal saline Adult IV Push Syringe (For Blood Pressure Support), , Intravenous, Anesthesia Intra-op, Farlow, Summer, CRNA, 160 mcg at 10/06/21 0905   propofol (DIPRIVAN) 10 mg/mL bolus/IV push, , Intravenous, Anesthesia Intra-op, FarLily Peerummer, CRNA, 50 mg at 10/06/21 0830   propofol (DIPRIVAN) 500 MG/50ML infusion, , Intravenous, Continuous PRN, FarLily Peerummer, CRNA, Stopped at 10/06/21 1017   remifentanil (ULTIVA) injection, , Intravenous, Continuous PRN, FarLily Peerummer, CRNA, Stopped at 10/06/21 1017   succinylcholine (ANECTINE) syringe, , Intravenous, Anesthesia Intra-op, FarLily Peerummer, CRNA, 100 mg at 10/06/21 [id number] (PITRESSIN) 20 UNIT/ML injection, ,  Intravenous, Anesthesia Intra-op, FarLily Peerummer, CRNA, 1 Units at 10/06/21 1019  Review of Systems  Constitutional:  Negative for appetite change and unexpected weight change.  Respiratory:  Negative for shortness of breath.   Cardiovascular:  Negative for chest pain.  Gastrointestinal:  Negative for abdominal distention and abdominal pain.  Endocrine: Negative for hot flashes.  Musculoskeletal:  Positive for arthralgias and neck pain. Negative for gait problem.       Left shoulder pain  Skin:  Negative for rash.  Neurological:  Negative for extremity weakness, gait problem and speech difficulty.       Left index finger and numbness   Hematological:  Negative for adenopathy.    Physical exam:  Vitals:   10/05/21 2243 10/06/21 0148 10/06/21 0230 10/06/21 0535  BP: (!) 140/94 (!) 149/85 (!) 140/88 (!) 157/95  Pulse: 86 82 73 85  Resp: '16 18 16 17  ' Temp:      TempSrc:      SpO2: 95% 97% 95% 99%  Weight:      Height:       Physical Exam HENT:     Head: Normocephalic.  Cardiovascular:     Rate and Rhythm: Normal rate.  Pulmonary:     Effort: Pulmonary effort is normal. No respiratory distress.     Breath sounds: No wheezing.  Abdominal:     General: Abdomen is flat. There is no distension.     Palpations: Abdomen is soft.  Skin:    General: Skin is warm.  Neurological:     Mental Status: He is alert and oriented to person, place, and time. Mental status is at baseline.     Cranial Nerves: No cranial nerve deficit.  Psychiatric:     Comments: tearful       Labs     Latest Ref Rng & Units 10/05/2021    1:02 PM 10/05/2021    5:11 AM 10/05/2021   12:25 AM  CBC  WBC 4.0 - 10.5 K/uL 10.4  11.7  12.7   Hemoglobin 13.0 - 17.0 g/dL 12.5  12.3  13.0   Hematocrit 39.0 - 52.0 % 37.2  36.6  38.7   Platelets 150 - 400 K/uL 158  153  158       Latest Ref Rng & Units 10/05/2021    5:11 AM 10/05/2021   12:25 AM 06/02/2021    9:23 PM  CMP  Glucose 70 - 99 mg/dL 117  101  126    BUN 8 - 23 mg/dL '22  23  23   ' Creatinine 0.61 - 1.24 mg/dL 0.79  0.75  0.72   Sodium 135 - 145 mmol/L 135  135  128   Potassium 3.5 - 5.1 mmol/L 3.5  3.6  3.7   Chloride 98 - 111 mmol/L 101  101  91   CO2 22 - 32 mmol/L '26  25  28   ' Calcium 8.9 - 10.3 mg/dL 9.1  9.3  9.2   Total Protein 6.5 - 8.1 g/dL   7.7   Total Bilirubin 0.3 - 1.2 mg/dL   1.3   Alkaline Phos 38 - 126 U/L   178   AST 15 - 41 U/L   37   ALT 0 - 44 U/L   23     Lab Results  Component Value Date   PSA1 2.6 10/10/2019   PSA1 1.4 08/28/2017   PSA1 0.9 08/18/2014   PSA 0.7 06/30/2011    10/04/21 PSA >1500    RADIOGRAPHIC STUDIES: I have personally reviewed the radiological images as listed and agreed with the findings in the report. DG Cervical Spine 2-3 Views  Result Date: 10/06/2021 CLINICAL DATA:  Cervical surgery EXAM: CERVICAL SPINE - 2-3 VIEW COMPARISON:  CT 10/05/2021 FINDINGS: A single C-arm fluoroscopic image was obtained intraoperatively and submitted for post operative interpretation. Lateral image of the cervical spine with tissue spreaders present posteriorly. 2 seconds fluoroscopy time utilized. Radiation dose: 0.9 mGy. Please see the performing provider's procedural report for further detail. IMPRESSION: Intraoperative fluoroscopic image during cervical spine surgery. Electronically Signed   By: Davina Poke D.O.   On: 10/06/2021 09:28   DG C-Arm 1-60 Min-No Report  Result Date: 10/06/2021 Fluoroscopy was utilized by the requesting physician.  No radiographic interpretation.   CT CERVICAL SPINE WO CONTRAST  Result Date: 10/05/2021 CLINICAL DATA:  Metastatic disease of the cervical spine. Unknown primary. EXAM: CT CERVICAL SPINE WITHOUT CONTRAST TECHNIQUE: Multidetector CT imaging of the cervical spine was performed without intravenous contrast. Multiplanar CT image reconstructions were also generated. RADIATION DOSE REDUCTION: This exam was performed according to the departmental  dose-optimization program which includes automated exposure control, adjustment of the mA and/or kV according to patient size and/or use of iterative reconstruction technique. COMPARISON:  MRI of the cervical spine without contrast 10/03/2021 FINDINGS: Alignment: Slight degenerative anterolisthesis again noted at C3-4. Alignment is otherwise anatomic. Reversal of the normal cervical lordosis is noted. Skull base and vertebrae: Craniocervical junction is within normal limits. Extensive heterogeneous sclerotic lesions are present in the cervical spine. Left-sided sclerotic lesions present at C5 and inferiorly at C4 corresponding 2 marrow signal changes on the MRI scan. Diffuse sclerotic changes present at C6 and C7. Mixed sclerotic and lytic changes are present in the posterior elements at C5 and C6. No pathologic fractures are present. Heterogeneous sclerotic changes are present within the spinous process at T1 and T2. Soft tissues and spinal canal: The extraosseous tumor at C5 and C6 is better appreciated on MRI. Prevertebral soft tissues are within normal limits. Disc levels: Central and foraminal narrowing is greatest at C5-6 and C6-7 is described on the MRI scan. Upper chest: Lung apices are clear. IMPRESSION: 1. Extensive heterogeneous sclerotic lesions throughout the cervical spine compatible with metastatic disease to the bone. 2. Mixed sclerotic and lytic changes in the posterior elements at C5 and C6. 3. Extraosseous tumor at C5 and C6 is better appreciated on the MRI scan. 4. No pathologic  fractures. 5. Central and foraminal narrowing is greatest at C5-6 and C6-7 is described on the MRI scan. Electronically Signed   By: San Morelle M.D.   On: 10/05/2021 12:54   CT CHEST ABDOMEN PELVIS W CONTRAST  Result Date: 10/05/2021 CLINICAL DATA:  Metastatic disease evaluation. EXAM: CT CHEST, ABDOMEN, AND PELVIS WITH CONTRAST TECHNIQUE: Multidetector CT imaging of the chest, abdomen and pelvis was  performed following the standard protocol during bolus administration of intravenous contrast. RADIATION DOSE REDUCTION: This exam was performed according to the departmental dose-optimization program which includes automated exposure control, adjustment of the mA and/or kV according to patient size and/or use of iterative reconstruction technique. CONTRAST:  161m OMNIPAQUE IOHEXOL 300 MG/ML  SOLN COMPARISON:  None Available. FINDINGS: CT CHEST FINDINGS Cardiovascular: No significant vascular findings. Normal heart size. No pericardial effusion. Mediastinum/Nodes: No enlarged mediastinal, hilar, or axillary lymph nodes. Thyroid gland, trachea, and esophagus demonstrate no significant findings. Lungs/Pleura: Mild lingular and mild right middle lobe linear atelectasis is seen. There is no evidence of acute infiltrate, pleural effusion or pneumothorax. Musculoskeletal: A left shoulder replacement is seen with associated streak artifact. Hazy, sclerotic appearing areas are seen involving the anterolateral aspects of the second and fourth right ribs. Multilevel degenerative changes seen throughout the thoracic spine. CT ABDOMEN PELVIS FINDINGS Hepatobiliary: No focal liver abnormality is seen. No gallstones, gallbladder wall thickening, or biliary dilatation. Pancreas: Unremarkable. No pancreatic ductal dilatation or surrounding inflammatory changes. Spleen: Punctate calcified granulomas are seen within the posterior aspect of an otherwise normal-appearing spleen. Adrenals/Urinary Tract: Adrenal glands are unremarkable. Kidneys are normal, without renal calculi, focal lesion, or hydronephrosis. The urinary bladder is limited in evaluation secondary to the presence of overlying streak artifact. Stomach/Bowel: Stomach is within normal limits. The appendix is not identified. Stool is seen throughout the large bowel. No evidence of bowel wall thickening, distention, or inflammatory changes. Noninflamed diverticula are seen  throughout the sigmoid colon. Vascular/Lymphatic: Aortic atherosclerosis. Moderate severity Para-aortic and aortocaval lymphadenopathy is seen. This extends into the pelvis along the bilateral iliac chains, right greater than left Reproductive: The prostate gland is markedly limited in evaluation secondary to the presence of overlying streak artifact. Other: No abdominal wall hernia or abnormality. No abdominopelvic ascites. 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 There is an extensive amount of associated streak artifact with subsequently limited evaluation of the adjacent osseous and soft tissue structures. Small lytic areas are seen at the levels of L4 and S1, with diffusely sclerotic changes seen throughout the remainder of the sacrum and left iliac bone. A mixed cystic and sclerotic lesion is seen within the right iliac bone. Multilevel degenerative changes are seen throughout the lumbar spine. IMPRESSION: 1. Moderate severity retroperitoneal and pelvic lymphadenopathy, consistent with metastatic disease. 2. Small lytic areas at the levels of L4 and S1, with diffusely sclerotic changes involving the second and fourth right ribs, sacrum and left iliac bone. 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 Further evaluation with a whole body nuclear medicine bone scan is recommended. 3. 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 4. Sigmoid diverticulosis. 5. Aortic atherosclerosis. Aortic Atherosclerosis (ICD10-I70.0). Electronically Signed   By: TVirgina NorfolkM.D.   On: 10/05/2021 01:56   MR CERVICAL SPINE WO CONTRAST  Result Date: 10/03/2021 CLINICAL DATA:  Neck and left shoulder pain EXAM: MRI CERVICAL SPINE WITHOUT CONTRAST TECHNIQUE: Multiplanar, multisequence MR imaging of the cervical spine was performed. No intravenous contrast was administered. COMPARISON:  None  Available. FINDINGS: Alignment: Reversal of cervical  lordosis. Mild anterolisthesis at C3-C4. Vertebrae: Multifocal abnormal marrow signal particularly involving C4-C7 and included upper thoracic vertebral bodies. This includes the posterior elements. Dorsal epidural soft tissue is present at C4. Circumferential involvement is present at C5-C7. There is marked canal stenosis dorsal to C6 without cord compression. Cord: No abnormal signal. Posterior Fossa, vertebral arteries, paraspinal tissues: Partially imaged paranasal sinus opacification. Disc levels: C2-C3:  Left facet hypertrophy.  No canal or foraminal stenosis. C3-C4: Disc bulge with endplate osteophytes. Left facet hypertrophy. Effacement of the ventral subarachnoid space without significant canal narrowing. No foraminal stenosis. C4-C5: Disc bulge with superimposed small right foraminal protrusion and endplate osteophytes. Left greater than right facet hypertrophy. Dorsal epidural soft tissue. No canal stenosis. No left foraminal stenosis. Probable mild right foraminal stenosis secondary to extraosseous disease. C5-C6: Disc bulge with endplate osteophytes. Facet and uncovertebral hypertrophy. Circumferential epidural soft tissue. Moderate to marked canal stenosis at disc level. Effacement of left greater than right foramina due to extraosseous disease. C6-C7: Disc bulge with endplate osteophytes. Uncovertebral and facet hypertrophy. Moderate canal stenosis at disc level. Effacement of left greater than right foramina due to extraosseous disease. C7-T1: No canal or right foraminal stenosis. Partial effacement left foramen due to extraosseous disease. IMPRESSION: Multifocal abnormal marrow signal with relatively diffuse involvement of C4 into the thoracic spine. Associated epidural disease, greatest at C6 where there is marked canal stenosis without cord compression. Left greater than right foraminal effacement due to extraosseous disease. May reflect metastatic disease or marrow infiltrative neoplastic  process. These results will be called to the ordering clinician or representative by the Radiologist Assistant, and communication documented in the PACS or [employer]. Electronically Signed   By: Macy Mis M.D.   On: 10/03/2021 10:28     Assessment and plan-   #Metastatic disease involving retroperitoneal and pelvic lymphadenopathy, extensive bone metastasis/epidural cord compression disease. Suspect that patient has metastatic prostate cancer.  PSA was obtained and level was >1500 LDH was also elevated. I discussed with neurosurgeon Dr. Cari Caraway via secure chat yesterday.  I agree with him that patient will benefit from neurosurgery intervention and meanwhile establish tissue diagnosis. Patient is status post C6 cervical laminectomy and resection of epidural spinal tumor.  Discussed with neurosurgery, patient does not need steroids. Given the high suspicion of metastatic prostate cancer, I discontinued inpatient PET scan and will arrange patient to have outpatient PSMA PET scan which is more specific for prostate cancer. Due to the extensive tumor burden, I discussed about the rationale and potential side effects of androgen deprivation therapy-Firmagon. Patient and his wife agree with proceeding with Mills Koller treatment without waiting for final pathology. Firmagon 240 mg injection order placed. Outpatient, patient will follow-up with me for additional imaging/androgen deprivation therapy/further discussion about upfront chemotherapy treatments.  I will also coordinate him to see radiation oncology outpatient.  Plan was discussed with Dr. Cari Caraway, and Dr. Sheppard Coil. Thank you for allowing me to participate in the care of this patient.   Earlie Server, MD, PhD Hematology Oncology 10/06/2021

## 2021-10-06 NOTE — Progress Notes (Signed)
PROGRESS NOTE - brief please see full H&P from earlier today    WOODFIN KISS   WUJ:[id number] DOB: 1955/03/13  DOA: 10/04/2021 Date of Service: 10/06/21 PCP: Default, Provider, MD     Brief Narrative / Hospital Course:  Jason [name] is a 66 y.o. occasion male with medical history significant for essential hypertension, hypothyroidism, GERD and osteoarthritis, presented to the emergency room 10/04/2021 with continued chronic neck pain since last December and associated brief symptoms left hand thumb and index paresthesias with tingling and numbness without weakness.  He had a recent MRI under anesthesia on 8/10 at [facility] hat revealed multifocal abnormal marrow signal with relatively diffuse involvement of C4 into the thoracic spine with associated epidural disease, greatest at C6 where there is marked canal stenosis without cord compression.  There was left greater than right foraminal effacement due to extraosseous disease.  It was thought that this may reflect metastatic disease or marrow infiltrative neoplastic process.  The patient has been followed by spine specialist with [facility].  He denied any headache or dizziness or blurred vision.  He cannot lift his left arm above 90 degrees.  08/11: In ED, CT abdomen/pelvis with contrast (+) retroperitoneal/pelvic lymphadenopathy c/w metastatic disease, lytic areas L4 S1 c/w osseous metastasis.  Patient was admitted to hospitalist service for cervical spinal stenosis with diminished sensation/strength LUE, pain control.  Per H&P, neurosurgery was notified about the patient (Dr. Cari Caraway), and oncology was notified about the patient (Dr. Tasia Catchings).  08/12: Discussed with Dr. Cari Caraway and Dr. Tasia Catchings, plan for surgery tomorrow to stabilize and biopsy C-spine. Dr. Tasia Catchings will see tomorrow. PET scan unavailable over the weekend.  08/13: s/p C6 cervical laminectomy and resection of epidural spinal tumor today, d/w neurosurgery does not need  steroids. Oncology recs: suspect metastatic prostate cancer, proceeding with Mills Koller anti-androgen treatment without waiting for final pathology, follow w/ [name] outpatient for further Rx/chemo and will arrange rad onc.    Consultants:  Neurosurgery Oncology  Procedures:  10/06/21 C6 cervical laminectomy and resection of epidural spinal tumor    Subjective: Patient reports no chest pain/shortness of breath, post op pain is controlled.      ASSESSMENT & PLAN:   Principal Problem:   Cervical spinal stenosis Active Problems:   Metastatic disease (HCC)   Hypertensive urgency   Hypothyroidism   Essential hypertension   GERD   BPH (benign prostatic hyperplasia)   Hyperlipidemia   Elevated PSA   Abnormal MRI   Goals of care, counseling/discussion   Cervical spinal stenosis Neurosurgery following s/p C6 cervical laminectomy and resection of epidural spinal tumor today 10/06/21  d/w neurosurgery does not need steroids  Pain management   Metastatic disease (HCC) osseous metastasis  lytic lesions at L4 and S1 retroperitoneal and pelvic lymphadenopathy. Neurosurgery obtained bone biopsy  Oncology recs: suspect metastatic prostate cancer proceeding with Mills Koller anti-androgen treatment without waiting for final pathology follow w/ [name] outpatient for further Rx/chemo and she will arrange rad onc.   Essential hypertension continue his antihypertensive therapy with lisinopril and Toprol-XL.  BPH (benign prostatic hyperplasia) continue Flomax.  Hypertensive urgency Resolved Baseline HTN  as needed IV labetalol. continue his antihypertensives.    DVT prophylaxis: SCD / ambulation Code Status: FULL Family Communication: wife at bedside Disposition Plan / TOC needs: pending clinical course Barriers to discharge / significant pending items: surgery tomorrow, PET scan Monday          Oakleigh Hesketh, DO [facility] 10/06/2021, 2:54 PM  Staff may  message me via secure chat in [place]  but this may not receive immediate response,  please page for urgent matters!  If 7PM-7AM, please contact night-coverage [email or web address]  Dictation software was used to generate the above note. Typos may occur and escape review, as with typed/written notes. Please contact Dr Sheppard Coil directly for clarity if needed.

## 2021-10-06 NOTE — ED Notes (Signed)
Advised nurse that patient has ready bed 

## 2021-10-07 ENCOUNTER — Inpatient Hospital Stay: Payer: BC Managed Care – PPO

## 2021-10-07 ENCOUNTER — Encounter: Payer: Self-pay | Admitting: Neurosurgery

## 2021-10-07 DIAGNOSIS — M4802 Spinal stenosis, cervical region: Secondary | ICD-10-CM

## 2021-10-07 DIAGNOSIS — I959 Hypotension, unspecified: Secondary | ICD-10-CM

## 2021-10-07 LAB — BASIC METABOLIC PANEL
Anion gap: 8 (ref 5–15)
Anion gap: 10 (ref 5–15)
BUN: 24 mg/dL — ABNORMAL HIGH (ref 8–23)
BUN: 30 mg/dL — ABNORMAL HIGH (ref 8–23)
CO2: 24 mmol/L (ref 22–32)
CO2: 22 mmol/L (ref 22–32)
Calcium: 8.5 mg/dL — ABNORMAL LOW (ref 8.9–10.3)
Calcium: 8.2 mg/dL — ABNORMAL LOW (ref 8.9–10.3)
Chloride: 100 mmol/L (ref 98–111)
Chloride: 100 mmol/L (ref 98–111)
Creatinine, Ser: 0.98 mg/dL (ref 0.61–1.24)
Creatinine, Ser: 0.99 mg/dL (ref 0.61–1.24)
GFR, Estimated: >60 mL/min (ref >60)
GFR, Estimated: >60 mL/min (ref >60)
Glucose, Bld: 110 mg/dL — ABNORMAL HIGH (ref 70–99)
Glucose, Bld: 137 mg/dL — ABNORMAL HIGH (ref 70–99)
Potassium: 3.9 mmol/L (ref 3.5–5.1)
Potassium: 3.6 mmol/L (ref 3.5–5.1)
Sodium: 132 mmol/L — ABNORMAL LOW (ref 135–145)
Sodium: 132 mmol/L — ABNORMAL LOW (ref 135–145)

## 2021-10-07 LAB — MRSA NEXT GEN BY PCR, NASAL: MRSA by PCR Next Gen: NOT DETECTED

## 2021-10-07 LAB — BLOOD GAS, ARTERIAL
Acid-Base Excess: 0.8 mmol/L (ref 0.0–2.0)
Bicarbonate: 24.2 mmol/L (ref 20.0–28.0)
FIO2: 21 %
O2 Saturation: 92.5 %
Patient temperature: 37
pCO2 arterial: 34 mmHg (ref 32–48)
pH, Arterial: 7.46 — ABNORMAL HIGH (ref 7.35–7.45)
pO2, Arterial: 60 mmHg — ABNORMAL LOW (ref 83–108)

## 2021-10-07 LAB — URINALYSIS, COMPLETE (UACMP) WITH MICROSCOPIC
Bacteria, UA: NONE SEEN
Bilirubin Urine: NEGATIVE
Glucose, UA: NEGATIVE mg/dL
Ketones, ur: NEGATIVE mg/dL
Leukocytes,Ua: NEGATIVE
Nitrite: NEGATIVE
Protein, ur: NEGATIVE mg/dL
Specific Gravity, Urine: 1.008 (ref 1.005–1.030)
Squamous Epithelial / HPF: NONE SEEN (ref 0–5)
pH: 5 (ref 5.0–8.0)

## 2021-10-07 LAB — CBC
HCT: 33 % — ABNORMAL LOW (ref 39.0–52.0)
HCT: 28.9 % — ABNORMAL LOW (ref 39.0–52.0)
Hemoglobin: 11.1 g/dL — ABNORMAL LOW (ref 13.0–17.0)
Hemoglobin: 9.8 g/dL — ABNORMAL LOW (ref 13.0–17.0)
MCH: 30.2 pg (ref 26.0–34.0)
MCH: 30.7 pg (ref 26.0–34.0)
MCHC: 33.6 g/dL (ref 30.0–36.0)
MCHC: 33.9 g/dL (ref 30.0–36.0)
MCV: 89.9 fL (ref 80.0–100.0)
MCV: 90.6 fL (ref 80.0–100.0)
Platelets: 146 10*3/uL — ABNORMAL LOW (ref 150–400)
Platelets: 148 10*3/uL — ABNORMAL LOW (ref 150–400)
RBC: 3.67 MIL/uL — ABNORMAL LOW (ref 4.22–5.81)
RBC: 3.19 MIL/uL — ABNORMAL LOW (ref 4.22–5.81)
RDW: 13.6 % (ref 11.5–15.5)
RDW: 13.6 % (ref 11.5–15.5)
WBC: 14.9 10*3/uL — ABNORMAL HIGH (ref 4.0–10.5)
WBC: 13.9 10*3/uL — ABNORMAL HIGH (ref 4.0–10.5)
nRBC: 0 % (ref 0.0–0.2)
nRBC: 0 % (ref 0.0–0.2)

## 2021-10-07 LAB — PROCALCITONIN: Procalcitonin: <0.1 ng/mL

## 2021-10-07 LAB — LACTIC ACID, PLASMA
Lactic Acid, Venous: 1.5 mmol/L (ref 0.5–1.9)
Lactic Acid, Venous: 2.8 mmol/L (ref 0.5–1.9)

## 2021-10-07 LAB — TYPE AND SCREEN
ABO/RH(D): A POS
Antibody Screen: NEGATIVE

## 2021-10-07 LAB — TROPONIN I (HIGH SENSITIVITY): Troponin I (High Sensitivity): 22 ng/L — ABNORMAL HIGH (ref <18)

## 2021-10-07 MED ORDER — MIDAZOLAM HCL 2 MG/2ML IJ SOLN
INTRAMUSCULAR | Status: AC
  Administered 2021-10-07: 2 mg
  Filled 2021-10-07: qty 2

## 2021-10-07 MED ORDER — ETOMIDATE 2 MG/ML IV SOLN
INTRAVENOUS | Status: AC
  Filled 2021-10-07: qty 20

## 2021-10-07 MED ORDER — DEXAMETHASONE SODIUM PHOSPHATE 4 MG/ML IJ SOLN
4.0000 mg | Freq: Four times a day (QID) | INTRAMUSCULAR | Status: DC
  Administered 2021-10-08 – 2021-10-09 (×6): 4 mg via INTRAVENOUS
  Filled 2021-10-07 (×7): qty 1

## 2021-10-07 MED ORDER — VANCOMYCIN HCL IN DEXTROSE 1-5 GM/200ML-% IV SOLN
1000.0000 mg | Freq: Two times a day (BID) | INTRAVENOUS | Status: DC
  Filled 2021-10-07: qty 200

## 2021-10-07 MED ORDER — HYDRALAZINE HCL 20 MG/ML IJ SOLN: 5.0000 mg | Freq: Four times a day (QID) | INTRAMUSCULAR | Status: DC | PRN

## 2021-10-07 MED ORDER — LACTATED RINGERS IV BOLUS
1000.0000 mL | Freq: Once | INTRAVENOUS | Status: AC
  Administered 2021-10-07: 1000 mL via INTRAVENOUS

## 2021-10-07 MED ORDER — MIDAZOLAM HCL 2 MG/2ML IJ SOLN
INTRAMUSCULAR | Status: AC
  Filled 2021-10-07: qty 2

## 2021-10-07 MED ORDER — NOREPINEPHRINE 4 MG/250ML-% IV SOLN
2.0000 ug/min | INTRAVENOUS | Status: DC
  Administered 2021-10-07: 5 ug/min via INTRAVENOUS
  Filled 2021-10-07 (×2): qty 250

## 2021-10-07 MED ORDER — SUCCINYLCHOLINE CHLORIDE 200 MG/10ML IV SOSY
PREFILLED_SYRINGE | INTRAVENOUS | Status: AC
  Filled 2021-10-07: qty 10

## 2021-10-07 MED ORDER — FENTANYL BOLUS VIA INFUSION: 50.0000 ug | INTRAVENOUS | Status: DC | PRN

## 2021-10-07 MED ORDER — FENTANYL CITRATE PF 50 MCG/ML IJ SOSY
PREFILLED_SYRINGE | INTRAMUSCULAR | Status: AC
  Administered 2021-10-07: 50 ug
  Filled 2021-10-07: qty 2

## 2021-10-07 MED ORDER — DEXAMETHASONE SODIUM PHOSPHATE 4 MG/ML IJ SOLN
4.0000 mg | Freq: Four times a day (QID) | INTRAMUSCULAR | Status: DC
Start: 2021-10-08 — End: 2021-10-07

## 2021-10-07 MED ORDER — POLYETHYLENE GLYCOL 3350 17 G PO PACK
17.0000 g | PACK | Freq: Every day | ORAL | Status: DC
  Administered 2021-10-07 – 2021-10-08 (×2): 17 g
  Filled 2021-10-07 (×2): qty 1

## 2021-10-07 MED ORDER — SODIUM CHLORIDE 0.9 % IV BOLUS
1000.0000 mL | Freq: Once | INTRAVENOUS | Status: AC
  Administered 2021-10-07: 1000 mL via INTRAVENOUS

## 2021-10-07 MED ORDER — SODIUM CHLORIDE 0.9 % IV SOLN
250.0000 mL | INTRAVENOUS | Status: DC
  Administered 2021-10-12 – 2021-10-13 (×2): 250 mL via INTRAVENOUS

## 2021-10-07 MED ORDER — VANCOMYCIN HCL 2000 MG/400ML IV SOLN
2000.0000 mg | Freq: Once | INTRAVENOUS | Status: AC
  Administered 2021-10-07: 2000 mg via INTRAVENOUS
  Filled 2021-10-07: qty 400

## 2021-10-07 MED ORDER — FENTANYL 2500MCG IN NS 250ML (10MCG/ML) PREMIX INFUSION
50.0000 ug/h | INTRAVENOUS | Status: DC
  Administered 2021-10-08: 50 ug/h via INTRAVENOUS
  Filled 2021-10-07 (×2): qty 250

## 2021-10-07 MED ORDER — PROPOFOL 1000 MG/100ML IV EMUL
INTRAVENOUS | Status: AC
  Administered 2021-10-07: 10 mg
  Filled 2021-10-07: qty 100

## 2021-10-07 MED ORDER — PROPOFOL 1000 MG/100ML IV EMUL
0.0000 ug/kg/min | INTRAVENOUS | Status: DC
  Administered 2021-10-07: 20 ug/kg/min via INTRAVENOUS
  Administered 2021-10-08 (×2): 30 ug/kg/min via INTRAVENOUS
  Filled 2021-10-07: qty 100
  Filled 2021-10-07: qty 200
  Filled 2021-10-07 (×2): qty 100

## 2021-10-07 MED ORDER — FENTANYL 2500MCG IN NS 250ML (10MCG/ML) PREMIX INFUSION
INTRAVENOUS | Status: AC
  Administered 2021-10-07: 100 ug/h via INTRAVENOUS
  Filled 2021-10-07: qty 250

## 2021-10-07 MED ORDER — FENTANYL CITRATE PF 50 MCG/ML IJ SOSY
50.0000 ug | PREFILLED_SYRINGE | Freq: Once | INTRAMUSCULAR | Status: AC
  Administered 2021-10-07: 50 ug via INTRAVENOUS

## 2021-10-07 MED ORDER — DEXAMETHASONE SODIUM PHOSPHATE 10 MG/ML IJ SOLN
10.0000 mg | Freq: Once | INTRAMUSCULAR | Status: AC
  Administered 2021-10-07: 10 mg via INTRAVENOUS
  Filled 2021-10-07: qty 1

## 2021-10-07 MED ORDER — FENTANYL CITRATE (PF) 100 MCG/2ML IJ SOLN
INTRAMUSCULAR | Status: AC
  Filled 2021-10-07: qty 2

## 2021-10-07 MED ORDER — DOCUSATE SODIUM 50 MG/5ML PO LIQD
100.0000 mg | Freq: Two times a day (BID) | ORAL | Status: DC
  Administered 2021-10-07 – 2021-10-08 (×2): 100 mg
  Filled 2021-10-07 (×2): qty 10

## 2021-10-07 MED ORDER — ROCURONIUM BROMIDE 10 MG/ML (PF) SYRINGE
PREFILLED_SYRINGE | INTRAVENOUS | Status: AC
  Administered 2021-10-07: 60 mg
  Filled 2021-10-07: qty 10

## 2021-10-07 MED ORDER — CHLORHEXIDINE GLUCONATE CLOTH 2 % EX PADS
6.0000 | MEDICATED_PAD | Freq: Every day | CUTANEOUS | Status: DC
  Administered 2021-10-07 – 2021-10-13 (×4): 6 via TOPICAL

## 2021-10-07 MED ORDER — NOREPINEPHRINE 4 MG/250ML-% IV SOLN
0.0000 ug/min | INTRAVENOUS | Status: DC
  Administered 2021-10-07: 8 ug/min via INTRAVENOUS
  Administered 2021-10-08: 6 ug/min via INTRAVENOUS
  Administered 2021-10-08: 3 ug/min via INTRAVENOUS
  Administered 2021-10-09: 2 ug/min via INTRAVENOUS
  Filled 2021-10-07 (×3): qty 250

## 2021-10-07 MED ORDER — SODIUM CHLORIDE 0.9 % IV SOLN
2.0000 g | Freq: Three times a day (TID) | INTRAVENOUS | Status: DC
  Administered 2021-10-07 – 2021-10-08 (×2): 2 g via INTRAVENOUS
  Filled 2021-10-07 (×3): qty 12.5

## 2021-10-07 NOTE — Plan of Care (Signed)

## 2021-10-07 NOTE — Progress Notes (Addendum)
Jason NOTE    CLIFORD [name]   ZOX:[id number] DOB: 17-Feb-1956  DOA: 10/04/2021 Date of Service: 10/07/21 PCP: Default, Provider, MD     Brief Narrative / Hospital Course:  SIRE POET is a 66 y.o. occasion male with medical history significant for essential hypertension, hypothyroidism, GERD and osteoarthritis, presented to the emergency room 10/04/2021 with continued chronic neck pain since last December and associated brief symptoms left hand thumb and index paresthesias with tingling and numbness without weakness.  He had a recent MRI under anesthesia on 8/10 at [facility] hat revealed multifocal abnormal marrow signal with relatively diffuse involvement of C4 into the thoracic spine with associated epidural disease, greatest at C6 where there is marked canal stenosis without cord compression.  There was left greater than right foraminal effacement due to extraosseous disease.  It was thought that this may reflect metastatic disease or marrow infiltrative neoplastic process.  The patient has been followed by spine specialist with [facility].  He denied any headache or dizziness or blurred vision.  He cannot lift his left arm above 90 degrees.  08/11: In ED, CT abdomen/pelvis with contrast (+) retroperitoneal/pelvic lymphadenopathy c/w metastatic disease, lytic areas L4 S1 c/w osseous metastasis.  Patient was admitted to hospitalist service for cervical spinal stenosis with diminished sensation/strength LUE, pain control.  Per H&P, neurosurgery was notified about the patient (Dr. Cari Caraway), and oncology was notified about the patient (Dr. Tasia Catchings).  08/12: Discussed with Dr. Cari Caraway and Dr. Tasia Catchings, plan for surgery tomorrow to stabilize and biopsy C-spine. Dr. Tasia Catchings will see tomorrow. PET scan unavailable over the weekend.  08/13: s/p C6 cervical laminectomy and resection of epidural spinal tumor today, d/w neurosurgery does not need steroids. Oncology recs: suspect metastatic prostate  cancer, proceeding with Mills Koller anti-androgen treatment without waiting for final pathology however this was not available per pharmacy, also plan to follow w/ [name] outpatient for further Rx/chemo and will arrange rad onc.  08/14: significant weakness in upper and lower extremities this morning. Messaged neurosurgery. Drain in place. BP soft, gave 1L bolus and held BP meds and IV pain meds. Confusion resolved and weakness improving later in the day, however BP again dropped lower and d/w ICU and neurosurgery, pt was transferred to ICU for pressor support. Also getting trope/EKG/Echo, lactate, procal, UA/UCx, BCx, ABG, MRI   Consultants:  Oncology Neurosurgery   Procedures:  10/06/21 C6 cervical laminectomy and resection of epidural spinal tumor   Subjective: Patient reports weakness today is worse/new, unable to feed himself this morning and unable to use the TV remote. No CP/SOB, no speech or vision changes, pain is controlled.      ASSESSMENT & PLAN:   Principal Problem:   Cervical spinal stenosis Active Problems:   Metastatic disease (HCC)   Hypertensive urgency   Hypothyroidism   Essential hypertension   GERD   BPH (benign prostatic hyperplasia)   Hyperlipidemia   Elevated PSA   Abnormal MRI   Goals of care, counseling/discussion   Hypotension and abnormal strength/sensation    Cervical spinal stenosis Neurosurgery following s/p C6 cervical laminectomy and resection of epidural spinal tumor 10/06/21  d/w neurosurgery does not need steroids  Pain management is adequate Bilateral UE and LE extremity weakness 10/07/21, strength above C5 appears normal, made neurosurgery aware  Metastatic disease (HCC) osseous metastasis  lytic lesions at L4 and S1 retroperitoneal and pelvic lymphadenopathy. Neurosurgery obtained bone biopsy  Oncology recs: suspect metastatic prostate cancer proceeding with Mills Koller anti-androgen treatment without waiting  for final pathology follow  w/ [name] outpatient for further Rx/chemo and she will arrange rad onc.   Essential hypertension continue his antihypertensive therapy with lisinopril and Toprol-XL.  BPH (benign prostatic hyperplasia) continue Flomax.  Hypertensive urgency Resolved Baseline HTN  as needed IV labetalol. continue his antihypertensives.  Hypotension and abnormal strength/sensation  Initially though possible med effect / surgical effect however BP and neuo status only briefly responded to fluids before worsening further, now concern for infection/sepsis vs spinal shock vs cardiogenic shock vs clot vs cancer effect ICU for pressor support  trope/EKG/Echo, lactate, procal, UA/UCx, BCx, ABG Neurosurgery following MRI pending      DVT prophylaxis: SCD Code Status: FULL Family Communication: updated wife at bedside Disposition Plan / TOC needs: pending clinical course, now in ICU Barriers to discharge / significant pending items: continued workup/treatment             Objective: Vitals:   10/07/21 1316 10/07/21 1446 10/07/21 1645 10/07/21 1655  BP: (!) 80/57 104/74 (!) 76/68 (!) 86/57  Pulse: 87 89 85 83  Resp: '18 18 19 '$ (!) 23  Temp: 98 F (36.7 C) 98.7 F (37.1 C) 97.6 F (36.4 C)   TempSrc: Oral  Oral   SpO2: 92% 94% 93% (!) 89%  Weight:   91.3 kg   Height:   '5\' 9"'$  (1.753 m)     Intake/Output Summary (Last 24 hours) at 10/07/2021 1723 Last data filed at 10/07/2021 1514 Gross per 24 hour  Intake 3122.56 ml  Output 930 ml  Net 2192.56 ml   Filed Weights   10/04/21 1656 10/07/21 1645  Weight: 85 kg 91.3 kg    Examination:  Constitutional:  VS as above General Appearance: alert, well-developed, well-nourished, NAD Eyes: Normal lids and conjunctive, non-icteric sclera Ears, Nose, Mouth, Throat: Normal external appearance MMM Neck: No masses, trachea midline Respiratory: Normal respiratory effort CTABL Cardiovascular: S1/S2 normal No lower extremity  edema Gastrointestinal: No tenderness Musculoskeletal:  No clubbing/cyanosis of digits Reduced grip strength worse on L 2/5 strength legs - moves some w/ legs restin gon bed, 3/5 dorsiflexion/plantar flexion against some resistance Unable to lift arms more than elbow flexion Neurological: No cranial nerve deficit on limited exam Alert 2/5 legs (knee and hip) moves some resting on bed, 3/5 dosri/plantar flexion against some resistance Grip strength 3/5 but bit weaker on L Unable to lift arms more than elbow flexion on R Facial strength appears intact Normal speech  Psychiatric: Normal judgment/insight Normal mood and affect       Scheduled Medications:   ascorbic acid  500 mg Oral Daily   [START ON 10/08/2021] Chlorhexidine Gluconate Cloth  6 each Topical Q0600   degarelix  240 mg Subcutaneous Once   dexamethasone (DECADRON) injection  4 mg Intravenous Q6H   docusate  100 mg Per Tube BID   etomidate       fentaNYL       fentaNYL       fentaNYL (SUBLIMAZE) injection  50 mcg Intravenous Once   levothyroxine  88 mcg Oral Q0600   loratadine  10 mg Oral BID   meloxicam  15 mg Oral Daily   midazolam       multivitamin with minerals  1 tablet Oral Daily   polyethylene glycol  17 g Per Tube Daily   potassium chloride  10 mEq Oral QHS   rocuronium bromide       rosuvastatin  5 mg Oral Daily   succinylcholine  tamsulosin  0.4 mg Oral Daily    Continuous Infusions:  sodium chloride     ceFEPime (MAXIPIME) IV     fentaNYL infusion INTRAVENOUS     norepinephrine (LEVOPHED) Adult infusion 5 mcg/min (10/07/21 1719)   propofol (DIPRIVAN) infusion     vancomycin       PRN Medications:  acetaminophen **OR** acetaminophen, etomidate, fentaNYL, fentaNYL, fentaNYL, hydrALAZINE, magnesium hydroxide, midazolam, ondansetron **OR** ondansetron (ZOFRAN) IV, oxyCODONE-acetaminophen, rocuronium bromide, succinylcholine  Antimicrobials:  Anti-infectives (From admission, onward)     Start     Dose/Rate Route Frequency Ordered Stop   10/07/21 2200  ceFEPIme (MAXIPIME) 2 g in sodium chloride 0.9 % 100 mL IVPB        2 g 200 mL/hr over 30 Minutes Intravenous Every 8 hours 10/07/21 1701     10/07/21 1800  vancomycin (VANCOREADY) IVPB 2000 mg/400 mL        2,000 mg 200 mL/hr over 120 Minutes Intravenous  Once 10/07/21 1701     10/06/21 2330  ceFAZolin (ANCEF) IVPB 2g/100 mL premix        2 g 200 mL/hr over 30 Minutes Intravenous  Once 10/06/21 2231 10/07/21 0049   10/06/21 0736  ceFAZolin (ANCEF) 2-4 GM/100ML-% IVPB       Note to Pharmacy: Mirna Mires: cabinet override      10/06/21 0736 10/06/21 1944       Data Reviewed: I have personally reviewed following labs and imaging studies  CBC: Recent Labs  Lab 10/05/21 0025 10/05/21 0511 10/05/21 1302 10/07/21 0308  WBC 12.7* 11.7* 10.4 14.9*  NEUTROABS 8.3*  --  7.5  --   HGB 13.0 12.3* 12.5* 11.1*  HCT 38.7* 36.6* 37.2* 33.0*  MCV 91.1 92.2 90.1 89.9  PLT 158 153 158 127*   Basic Metabolic Panel: Recent Labs  Lab 10/05/21 0025 10/05/21 0511 10/07/21 0308  NA 135 135 132*  K 3.6 3.5 3.9  CL 101 101 100  CO2 '25 26 24  '$ GLUCOSE 101* 117* 110*  BUN 23 22 24*  CREATININE 0.75 0.79 0.98  CALCIUM 9.3 9.1 8.5*   GFR: Estimated Creatinine Clearance: 86.1 mL/min (by C-G formula based on SCr of 0.98 mg/dL). Liver Function Tests: No results for input(s): "AST", "ALT", "ALKPHOS", "BILITOT", "PROT", "ALBUMIN" in the last 168 hours. No results for input(s): "LIPASE", "AMYLASE" in the last 168 hours. No results for input(s): "AMMONIA" in the last 168 hours. Coagulation Profile: No results for input(s): "INR", "PROTIME" in the last 168 hours. Cardiac Enzymes: No results for input(s): "CKTOTAL", "CKMB", "CKMBINDEX", "TROPONINI" in the last 168 hours. BNP (last 3 results) No results for input(s): "PROBNP" in the last 8760 hours. HbA1C: No results for input(s): "HGBA1C" in the last 72  hours. CBG: No results for input(s): "GLUCAP" in the last 168 hours. Lipid Profile: No results for input(s): "CHOL", "HDL", "LDLCALC", "TRIG", "CHOLHDL", "LDLDIRECT" in the last 72 hours. Thyroid Function Tests: No results for input(s): "TSH", "T4TOTAL", "FREET4", "T3FREE", "THYROIDAB" in the last 72 hours. Anemia Panel: No results for input(s): "VITAMINB12", "FOLATE", "FERRITIN", "TIBC", "IRON", "RETICCTPCT" in the last 72 hours. Urine analysis:    Component Value Date/Time   COLORURINE STRAW (A) 10/05/2021 1415   APPEARANCEUR CLEAR (A) 10/05/2021 1415   APPEARANCEUR Clear 07/04/2016 0843   LABSPEC 1.008 10/05/2021 1415   PHURINE 6.0 10/05/2021 1415   GLUCOSEU NEGATIVE 10/05/2021 1415   HGBUR SMALL (A) 10/05/2021 1415   BILIRUBINUR NEGATIVE 10/05/2021 1415   BILIRUBINUR Negative 07/15/2018 1002  BILIRUBINUR Negative 07/04/2016 [place] 10/05/2021 1415   PROTEINUR NEGATIVE 10/05/2021 1415   UROBILINOGEN 0.2 07/15/2018 1002   NITRITE NEGATIVE 10/05/2021 1415   LEUKOCYTESUR NEGATIVE 10/05/2021 1415   Sepsis Labs: '@LABRCNTIP'$ (procalcitonin:4,lacticidven:4)  No results found for this or any previous visit (from the past 240 hour(s)).       Radiology Studies: CT CERVICAL SPINE WO CONTRAST  Result Date: 10/05/2021 CLINICAL DATA:  Metastatic disease of the cervical spine. Unknown primary. EXAM: CT CERVICAL SPINE WITHOUT CONTRAST TECHNIQUE: Multidetector CT imaging of the cervical spine was performed without intravenous contrast. Multiplanar CT image reconstructions were also generated. RADIATION DOSE REDUCTION: This exam was performed according to the departmental dose-optimization program which includes automated exposure control, adjustment of the mA and/or kV according to patient size and/or use of iterative reconstruction technique. COMPARISON:  MRI of the cervical spine without contrast 10/03/2021 FINDINGS: Alignment: Slight degenerative anterolisthesis again  noted at C3-4. Alignment is otherwise anatomic. Reversal of the normal cervical lordosis is noted. Skull base and vertebrae: Craniocervical junction is within normal limits. Extensive heterogeneous sclerotic lesions are present in the cervical spine. Left-sided sclerotic lesions present at C5 and inferiorly at C4 corresponding 2 marrow signal changes on the MRI scan. Diffuse sclerotic changes present at C6 and C7. Mixed sclerotic and lytic changes are present in the posterior elements at C5 and C6. No pathologic fractures are present. Heterogeneous sclerotic changes are present within the spinous process at T1 and T2. Soft tissues and spinal canal: The extraosseous tumor at C5 and C6 is better appreciated on MRI. Prevertebral soft tissues are within normal limits. Disc levels: Central and foraminal narrowing is greatest at C5-6 and C6-7 is described on the MRI scan. Upper chest: Lung apices are clear. IMPRESSION: 1. Extensive heterogeneous sclerotic lesions throughout the cervical spine compatible with metastatic disease to the bone. 2. Mixed sclerotic and lytic changes in the posterior elements at C5 and C6. 3. Extraosseous tumor at C5 and C6 is better appreciated on the MRI scan. 4. No pathologic fractures. 5. Central and foraminal narrowing is greatest at C5-6 and C6-7 is described on the MRI scan. Electronically Signed   By: San Morelle M.D.   On: 10/05/2021 12:54   CT CHEST ABDOMEN PELVIS W CONTRAST  Result Date: 10/05/2021 CLINICAL DATA:  Metastatic disease evaluation. EXAM: CT CHEST, ABDOMEN, AND PELVIS WITH CONTRAST TECHNIQUE: Multidetector CT imaging of the chest, abdomen and pelvis was performed following the standard protocol during bolus administration of intravenous contrast. RADIATION DOSE REDUCTION: This exam was performed according to the departmental dose-optimization program which includes automated exposure control, adjustment of the mA and/or kV according to patient size and/or use  of iterative reconstruction technique. CONTRAST:  152m OMNIPAQUE IOHEXOL 300 MG/ML  SOLN COMPARISON:  None Available. FINDINGS: CT CHEST FINDINGS Cardiovascular: No significant vascular findings. Normal heart size. No pericardial effusion. Mediastinum/Nodes: No enlarged mediastinal, hilar, or axillary lymph nodes. Thyroid gland, trachea, and esophagus demonstrate no significant findings. Lungs/Pleura: Mild lingular and mild right middle lobe linear atelectasis is seen. There is no evidence of acute infiltrate, pleural effusion or pneumothorax. Musculoskeletal: A left shoulder replacement is seen with associated streak artifact. Hazy, sclerotic appearing areas are seen involving the anterolateral aspects of the second and fourth right ribs. Multilevel degenerative changes seen throughout the thoracic spine. CT ABDOMEN PELVIS FINDINGS Hepatobiliary: No focal liver abnormality is seen. No gallstones, gallbladder wall thickening, or biliary dilatation. Pancreas: Unremarkable. No pancreatic ductal dilatation or surrounding inflammatory changes. Spleen:  Punctate calcified granulomas are seen within the posterior aspect of an otherwise normal-appearing spleen. Adrenals/Urinary Tract: Adrenal glands are unremarkable. Kidneys are normal, without renal calculi, focal lesion, or hydronephrosis. The urinary bladder is limited in evaluation secondary to the presence of overlying streak artifact. Stomach/Bowel: Stomach is within normal limits. The appendix is not identified. Stool is seen throughout the large bowel. No evidence of bowel wall thickening, distention, or inflammatory changes. Noninflamed diverticula are seen throughout the sigmoid colon. Vascular/Lymphatic: Aortic atherosclerosis. Moderate severity Para-aortic and aortocaval lymphadenopathy is seen. This extends into the pelvis along the bilateral iliac chains, right greater than left Reproductive: The prostate gland is markedly limited in evaluation secondary to  the presence of overlying streak artifact. Other: No abdominal wall hernia or abnormality. No abdominopelvic ascites. 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 There is an extensive amount of associated streak artifact with subsequently limited evaluation of the adjacent osseous and soft tissue structures. Small lytic areas are seen at the levels of L4 and S1, with diffusely sclerotic changes seen throughout the remainder of the sacrum and left iliac bone. A mixed cystic and sclerotic lesion is seen within the right iliac bone. Multilevel degenerative changes are seen throughout the lumbar spine. IMPRESSION: 1. Moderate severity retroperitoneal and pelvic lymphadenopathy, consistent with metastatic disease. 2. Small lytic areas at the levels of L4 and S1, with diffusely sclerotic changes involving the second and fourth right ribs, sacrum and left iliac bone. 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 Further evaluation with a whole body nuclear medicine bone scan is recommended. 3. 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 4. Sigmoid diverticulosis. 5. Aortic atherosclerosis. Aortic Atherosclerosis (ICD10-I70.0). Electronically Signed   By: Virgina Norfolk M.D.   On: 10/05/2021 01:56            LOS: 2 days       Emeterio Reeve, DO [facility] 10/07/2021, 5:23 PM   Staff may message me via secure chat in [place]  but this may not receive immediate response,  please page for urgent matters!  If 7PM-7AM, please contact night-coverage [email or web address]  Dictation software was used to generate the above note. Typos may occur and escape review, as with typed/written notes. Please contact Dr Sheppard Coil directly for clarity if needed.

## 2021-10-07 NOTE — Procedures (Signed)
Central Venous Catheter Insertion Procedure Note  Jason [name]  [id number]  12/25/1955  Date:10/07/21  Time:6:05 PM   Provider Performing:Jeremian Loletha Grayer Tamala Julian   Procedure: Insertion of Non-tunneled Central Venous Catheter(36556) with US guidance (50539)   Indication(s) Medication administration  Consent Verbal  Anesthesia Topical only with 1% lidocaine   Timeout Verified patient identification, verified procedure, site/side was marked, verified correct patient position, special equipment/implants available, medications/allergies/relevant history reviewed, required imaging and test results available.  Sterile Technique Maximal sterile technique including full sterile barrier drape, hand hygiene, sterile gown, sterile gloves, mask, hair covering, sterile ultrasound probe cover (if used).  Procedure Description Area of catheter insertion was cleaned with chlorhexidine and draped in sterile fashion.  With real-time ultrasound guidance a central venous catheter was placed into the left internal jugular vein. Nonpulsatile blood flow and easy flushing noted in all ports.  The catheter was sutured in place and sterile dressing applied.  Complications/Tolerance None; patient tolerated the procedure well. Chest X-ray is ordered to verify placement for internal jugular or subclavian cannulation.   Chest x-ray is not ordered for femoral cannulation.  EBL Minimal  Specimen(s) None

## 2021-10-07 NOTE — Progress Notes (Signed)
Pharmacy Antibiotic Note  Jason [name] is a 66 y.o. male admitted on 10/04/2021 with sepsis.  Pharmacy has been consulted for Cefepime and Vancomycin dosing.  Plan: Vancomycin '2000mg'$  IV loading dose Vancomycin 1000 mg IV Q 12 hrs. Goal AUC 400-550. Expected AUC: 444.6 SCr used: 0.98 Expected Cmin: 12.8   Height: '5\' 9"'$  (175.3 cm) Weight: 91.3 kg (201 lb 4.5 oz) IBW/kg (Calculated) : 70.7  Temp (24hrs), Avg:98.1 F (36.7 C), Min:97.6 F (36.4 C), Max:98.9 F (37.2 C)  Recent Labs  Lab 10/05/21 0025 10/05/21 0511 10/05/21 1302 10/07/21 0308  WBC 12.7* 11.7* 10.4 14.9*  CREATININE 0.75 0.79  --  0.98    Estimated Creatinine Clearance: 86.1 mL/min (by C-G formula based on SCr of 0.98 mg/dL).    No Known Allergies  Antimicrobials this admission: Cefepime 8/14 >>  Vancomycin 8/14 >>   Dose adjustments this admission:  Microbiology results:   Thank you for allowing pharmacy to be a part of this patient's care.  Paulina Fusi, PharmD, BCPS 10/07/2021 7:21 PM

## 2021-10-07 NOTE — Assessment & Plan Note (Addendum)
Initially though possible med effect / surgical effect however BP and neuo status only briefly responded to fluids before worsening further, now concern for infection/sepsis vs spinal shock vs cardiogenic shock vs clot vs cancer effect  ICU for pressor support   trope/EKG/Echo, lactate, procal, UA/UCx, BCx, ABG  Neurosurgery following  MRI pending

## 2021-10-07 NOTE — Consult Note (Signed)
NAME:  Jason [name], MRN:  [id number], DOB:  1955/05/02, LOS: 2 ADMISSION DATE:  10/04/2021, CONSULTATION DATE:  10/07/21 REFERRING MD:  Sheppard Coil, CHIEF COMPLAINT:  weakness   History of Present Illness:  66 year old man who presented with neck/shoulder pain found to have widely metastatic cancer suspicious for prostate s/p emergent C6 cervical laminectomy with resection of epidural spinal tumor.  He is POD 1 and having some worsening shock despite fluids.  Becoming more confused, lethargic so PCCM consulted.  Pertinent  Medical History  HTN Hypothyroidism Allergy  Significant Hospital Events: Including procedures, antibiotic start and stop dates in addition to other pertinent events   8/12 admitted with chronic neck pain and worsening L hand paresthesias, L arm weakness  Interim History / Subjective:  Consulted  Objective   Blood pressure 104/74, pulse 89, temperature 98.7 F (37.1 C), resp. rate 18, height '5\' 9"'$  (1.753 m), weight 85 kg, SpO2 94 %.        Intake/Output Summary (Last 24 hours) at 10/07/2021 1624 Last data filed at 10/07/2021 1514 Gross per 24 hour  Intake 3122.56 ml  Output 930 ml  Net 2192.56 ml   Filed Weights   10/04/21 1656  Weight: 85 kg    Examination: General: lethargic ill appearing man HENT: MMM, trachea midline, Neck surgical site with drain and small sanguinous output Lungs: clear, no wheezing, no accesory muscle use Cardiovascular: RRR, ext warm, heart sounds distant, pulses weak Abdomen: soft, nontender, neg BS Extremities: lukewarm, no edema Neuro: globally weak, nonfocal Psych: fatigued, slightly confused  AM labs showing some leukocytosis Sodium down Cr up from preop  Resolved Hospital Problem list   N/A  Assessment & Plan:  Undifferentiated shock postop- not hemorrhagic, no space for this much loss; will look at heart when gets to ICU and start sepsis workup.  Spinal shock possible but thought less likely per NSGY who knows  resection of bed.  Vagal less likely from urinary retention but will see how does once  New metastatic cancer with pelvic adenopathy, multiple spinal mets- severely elevated PSA, c/w metastatic prostate but path pending. Hx HTN- not issue at present Developing AKI, urinary retention- would place foley for acute phase  - ICU transfer - 1 more L fluid - Formal and bedside stat echo, trops, EKG - Levophed for MAP 65 - Start decadron - Check cultures, lactate, UA, BMP/CBC/type/screen/ABG - Stat repeat MRI C spine ordered per NSGY - Drain management per NSGY - f/u tissue path - Insert foley, avoid nephrotoxins - Family updated  Best Practice (right click and "Reselect all SmartList Selections" daily)   Diet/type: NPO DVT prophylaxis: SCD GI prophylaxis: N/A Lines: N/A but may need Foley:  Yes, and it is still needed Code Status:  full code Last date of multidisciplinary goals of care discussion [Pending]  Labs   CBC: Recent Labs  Lab 10/05/21 0025 10/05/21 0511 10/05/21 1302 10/07/21 0308  WBC 12.7* 11.7* 10.4 14.9*  NEUTROABS 8.3*  --  7.5  --   HGB 13.0 12.3* 12.5* 11.1*  HCT 38.7* 36.6* 37.2* 33.0*  MCV 91.1 92.2 90.1 89.9  PLT 158 153 158 146*    Basic Metabolic Panel: Recent Labs  Lab 10/05/21 0025 10/05/21 0511 10/07/21 0308  NA 135 135 132*  K 3.6 3.5 3.9  CL 101 101 100  CO2 '25 26 24  '$ GLUCOSE 101* 117* 110*  BUN 23 22 24*  CREATININE 0.75 0.79 0.98  CALCIUM 9.3 9.1 8.5*  GFR: Estimated Creatinine Clearance: 83.4 mL/min (by C-G formula based on SCr of 0.98 mg/dL). Recent Labs  Lab 10/05/21 0025 10/05/21 0511 10/05/21 1302 10/07/21 0308  WBC 12.7* 11.7* 10.4 14.9*    Liver Function Tests: No results for input(s): "AST", "ALT", "ALKPHOS", "BILITOT", "PROT", "ALBUMIN" in the last 168 hours. No results for input(s): "LIPASE", "AMYLASE" in the last 168 hours. No results for input(s): "AMMONIA" in the last 168 hours.  ABG No results found for:  "PHART", "PCO2ART", "PO2ART", "HCO3", "TCO2", "ACIDBASEDEF", "O2SAT"   Coagulation Profile: No results for input(s): "INR", "PROTIME" in the last 168 hours.  Cardiac Enzymes: No results for input(s): "CKTOTAL", "CKMB", "CKMBINDEX", "TROPONINI" in the last 168 hours.  HbA1C: No results found for: "HGBA1C"  CBG: No results for input(s): "GLUCAP" in the last 168 hours.  Review of Systems:    Positive Symptoms in bold:  Constitutional fevers, chills, weight loss, fatigue, anorexia, malaise  Eyes decreased vision, double vision, eye irritation  Ears, Nose, Mouth, Throat sore throat, trouble swallowing, sinus congestion  Cardiovascular chest pain, paroxysmal nocturnal dyspnea, lower ext edema, palpitations   Respiratory SOB, cough, DOE, hemoptysis, wheezing  Gastrointestinal nausea, vomiting, diarrhea  Genitourinary burning with urination, trouble urinating  Musculoskeletal joint aches, joint swelling, back pain  Integumentary  rashes, skin lesions  Neurological focal weakness, focal numbness, trouble speaking, headaches  Psychiatric depression, anxiety, confusion  Endocrine polyuria, polydipsia, cold intolerance, heat intolerance  Hematologic abnormal bruising, abnormal bleeding, unexplained nose bleeds  Allergic/Immunologic recurrent infections, hives, swollen lymph nodes     Past Medical History:  He,  has a past medical history of Allergy, Arthritis, GERD (gastroesophageal reflux disease), Hypertension, Hypothyroidism, and Thyroid disease.   Surgical History:   Past Surgical History:  Procedure Laterality Date   CARDIAC CATHETERIZATION  03/2000   HERNIA REPAIR  [id number]   umbilical   LEFT HEART CATH AND CORONARY ANGIOGRAPHY Left 03/20/2020   Procedure: LEFT HEART CATH AND CORONARY ANGIOGRAPHY;  Surgeon: Nelva Bush, MD;  Location: [facility];  Service: Cardiovascular;  Laterality: Left;   POSTERIOR CERVICAL FUSION/FORAMINOTOMY N/A 10/06/2021   Procedure:  POSTERIOR CERVICAL FUSION/ FORAMINOTOMY LEVEL 3;  Surgeon: Meade Maw, MD;  Location: [facility];  Service: Neurosurgery;  Laterality: N/A;  will need monitoring   RADIOLOGY WITH ANESTHESIA Left 04/18/2021   Procedure: MRI SHOULDER WITHOUT CONTRAST WITH ANESTHESIA;  Surgeon: Radiologist, Medication, MD;  Location: [place];  Service: Radiology;  Laterality: Left;   RADIOLOGY WITH ANESTHESIA N/A 10/03/2021   Procedure: MRI CERVICAL SPINE WITH ANESTHESIA;  Surgeon: Radiologist, Medication, MD;  Location: [place];  Service: Radiology;  Laterality: N/A;   REVERSE SHOULDER ARTHROPLASTY Left    ROTATOR CUFF REPAIR  11/2010   Dr [name] Heck   TONSILLECTOMY     TOTAL HIP ARTHROPLASTY Right 02/24/2009   TOTAL HIP ARTHROPLASTY Left 06/2016   Dr Harlow Mares     Social History:   reports that he has never smoked. He has never used smokeless tobacco. He reports that he does not currently use alcohol. He reports that he does not use drugs.   Family History:  His family history includes Gout in his father; Heart attack (age of onset: 25) in his father; Heart disease in his sister; Hypertension in his mother; Stroke in his mother. There is no history of Colon cancer, Esophageal cancer, Rectal cancer, or Stomach cancer.   Allergies No Known Allergies   Home Medications  Prior to Admission medications   Medication Sig Start Date End Date  Taking? Authorizing Provider  acetaminophen (TYLENOL) 500 MG tablet Take 1,000 mg by mouth in the morning, at noon, and at bedtime.   Yes [provider]  Ascorbic Acid (VITA-C PO) Take 1,000 mg by mouth daily.   Yes [provider]  cetirizine (ZYRTEC) 10 MG tablet Take 10 mg by mouth at bedtime.   Yes [provider]  CRANBERRY PO Take 500 mg by mouth at bedtime.   Yes [provider]  cyclobenzaprine (FLEXERIL) 10 MG tablet TAKE 1 TABLET BY MOUTH EVERY DAY AT NIGHT AS NEEDED Patient taking differently: Take 10 mg by mouth at bedtime. TAKE 1  TABLET BY MOUTH EVERY DAY AT NIGHT AS NEEDED 08/19/21  Yes Venia Carbon, MD  levothyroxine (SYNTHROID) 88 MCG tablet TAKE 1 TABLET BY MOUTH EVERY DAY Patient taking differently: Take 88 mcg by mouth daily before breakfast. 09/23/21  Yes Viviana Simpler I, MD  lisinopril (ZESTRIL) 20 MG tablet TAKE 1 TABLET BY MOUTH EVERY DAY 08/19/21  Yes End, Harrell Gave, MD  loratadine (CLARITIN) 10 MG tablet Take 10 mg by mouth in the morning and at bedtime.   Yes [provider]  meloxicam (MOBIC) 15 MG tablet Take 1 tablet (15 mg total) by mouth daily. 03/12/21  Yes Paulette Blanch, MD  metoprolol succinate (TOPROL-XL) 100 MG 24 hr tablet TAKE 1 TABLET BY MOUTH EVERY DAY WITH OR IMMEDIATELY FOLLOWING A MEAL Patient taking differently: Take 100 mg by mouth daily. 09/23/21  Yes End, Harrell Gave, MD  Multiple Vitamin (MULTIVITAMIN) tablet Take 1 tablet by mouth daily.   Yes [provider]  potassium chloride (KLOR-CON) 10 MEQ tablet TAKE 1 TABLET BY MOUTH EVERY DAY Patient taking differently: Take 10 mEq by mouth at bedtime. 09/23/21  Yes End, Harrell Gave, MD  rosuvastatin (CRESTOR) 5 MG tablet TAKE 1 TABLET (5 MG TOTAL) BY MOUTH DAILY. 09/23/21 12/22/21 Yes End, Harrell Gave, MD  spironolactone (ALDACTONE) 25 MG tablet TAKE 1/2 TABLET BY MOUTH EVERY DAY 09/23/21  Yes End, Harrell Gave, MD  tamsulosin (FLOMAX) 0.4 MG CAPS capsule TAKE 1 CAPSULE BY MOUTH EVERY DAY 09/27/20  Yes Viviana Simpler I, MD  torsemide (DEMADEX) 10 MG tablet TAKE 1 TABLET BY MOUTH EVERY DAY 08/19/21  Yes End, Harrell Gave, MD  amoxicillin (AMOXIL) 500 MG capsule Take 2,000 mg by mouth See admin instructions. Take 4 tablets by mouth prior to dental procedures. Patient not taking: Reported on 10/05/2021 07/12/20   [provider]  Menthol, Topical Analgesic, (ICY HOT) 5 % PTCH Apply 1-2 patches topically daily as needed (pain).    [provider]  oxyCODONE-acetaminophen (PERCOCET/ROXICET) 5-325 MG tablet Take 1 tablet  by mouth every 4 (four) hours as needed for severe pain. 05/06/21   Venia Carbon, MD  sildenafil (REVATIO) 20 MG tablet Take 3-5 tablets (60-100 mg total) by mouth daily as needed. 10/05/18   Venia Carbon, MD     Critical care time: 35 mins independent of procedures

## 2021-10-07 NOTE — Procedures (Signed)
Arterial Catheter Insertion Procedure Note  AFFAN CALLOW  [id number]  09-15-1955  Date:10/07/21  Time:6:04 PM    Provider Performing: Candee Furbish    Procedure: Insertion of Arterial Line [phone]) with US guidance (21117)   Indication(s) Blood pressure monitoring and/or need for frequent ABGs  Consent Verbal  Anesthesia None   Time Out Verified patient identification, verified procedure, site/side was marked, verified correct patient position, special equipment/implants available, medications/allergies/relevant history reviewed, required imaging and test results available.   Sterile Technique Maximal sterile technique including full sterile barrier drape, hand hygiene, sterile gown, sterile gloves, mask, hair covering, sterile ultrasound probe cover (if used).   Procedure Description Area of catheter insertion was cleaned with chlorhexidine and draped in sterile fashion. With real-time ultrasound guidance an arterial catheter was placed into the right radial artery.  Appropriate arterial tracings confirmed on monitor.     Complications/Tolerance None; patient tolerated the procedure well.   EBL Minimal   Specimen(s) None

## 2021-10-07 NOTE — Plan of Care (Signed)
  Problem: Education: Goal: Knowledge of General Education information will improve Description: Including pain rating scale, medication(s)/side effects and non-pharmacologic comfort measures Outcome: Progressing   Problem: Health Behavior/Discharge Planning: Goal: Ability to manage health-related needs will improve Outcome: Progressing   Problem: Clinical Measurements: Goal: Ability to maintain clinical measurements within normal limits will improve Outcome: Progressing   Problem: Clinical Measurements: Goal: Will remain free from infection Outcome: Progressing   Problem: Clinical Measurements: Goal: Diagnostic test results will improve Outcome: Progressing   Problem: Clinical Measurements: Goal: Respiratory complications will improve Outcome: Progressing   Problem: Clinical Measurements: Goal: Cardiovascular complication will be avoided Outcome: Progressing   Problem: Activity: Goal: Risk for activity intolerance will decrease Outcome: Progressing   Problem: Nutrition: Goal: Adequate nutrition will be maintained Outcome: Progressing   Problem: Coping: Goal: Level of anxiety will decrease Outcome: Progressing   Problem: Elimination: Goal: Will not experience complications related to bowel motility Outcome: Progressing   Problem: Elimination: Goal: Will not experience complications related to urinary retention Outcome: Progressing   Problem: Pain Managment: Goal: General experience of comfort will improve Outcome: Progressing   Problem: Safety: Goal: Ability to remain free from injury will improve Outcome: Progressing   Problem: Skin Integrity: Goal: Risk for impaired skin integrity will decrease Outcome: Progressing   

## 2021-10-07 NOTE — Procedures (Signed)
Intubation Procedure Note  LAMON ROTUNDO  [id number]  September 01, 1955  Date:10/07/21  Time:6:04 PM   Provider Performing:Jesiah C Tamala Julian    Procedure: Intubation (31500)  Indication(s) Respiratory Failure  Consent Verbal   Anesthesia Versed, Fentanyl, and Rocuronium   Time Out Verified patient identification, verified procedure, site/side was marked, verified correct patient position, special equipment/implants available, medications/allergies/relevant history reviewed, required imaging and test results available.   Sterile Technique Usual hand hygeine, masks, and gloves were used   Procedure Description Patient positioned in bed supine.  Sedation given as noted above.  Patient was intubated with endotracheal tube using Glidescope.  View was Grade 1 full glottis .  Number of attempts was 1.  Colorimetric CO2 detector was consistent with tracheal placement.   Complications/Tolerance None; patient tolerated the procedure well. Chest X-ray is ordered to verify placement.   EBL Minimal   Specimen(s) None

## 2021-10-07 NOTE — Progress Notes (Signed)
Stepdown orders placed for patient and bed assigned. Before pt arrived to unit, rapid response was called for hypotension and mental status change. On my arrival to unit, pt lethargic and diaphoretic. Immediately transferred pt to ICU 4.

## 2021-10-07 NOTE — Progress Notes (Addendum)
Attending Progress Note  History: Jason [name] is s/p C6 laminectomy for resection of epidural mass  POD1 PM: I was contacted to see Jason [name] at approximately 1615.  I saw him in his room at approximately 1620, and was able to review the clinical situation with his wife.  From review with her, it seems that Jason [name] was having some issues with his arms when she arrived at around 38, when he reported some difficulty feeding himself with lunch.  Per nursing, his nurses also noted that he was having some difficulty with movement of his arms and legs, but he improved with a fluid bolus around 1300.   When I saw him at 1620, he was arousable but not readily interactive with examination.  He moved his arms but was not reliable. He was noted to be significantly hypotensive.  ICU attending Dr. Tamala Julian, hospitalist Dr. Sheppard Coil, and I reviewed the case and all agreed transfer to ICU was appropriate.  This was done, and his blood pressure improved with another fluid bolus.  I then reevaluated him, noted below.  POD1 AM: Increased tingling in right hand this morning. Reports good pain control  Physical Exam: Vitals:   10/07/21 1730 10/07/21 1741  BP:  (!) 143/86  Pulse: 90 87  Resp: 19 18  Temp:    SpO2: 98% 99%    AA Ox3 CNI  5/5 R deltoid and biceps, 3/5 L deltoid and 5/5 L biceps.  No clear tone in L triceps.  He is able to extend but not flex his wrists.  He has no finger extension or flexion.  He cannot intentionally move his legs.    He reports normal sensation in his arms except his hands,which are tingling bilaterally.  He can feel light touch in his trunk bilaterally, as well as LLE.  RLE he cannot feel.  Dressing - saturated.  It was bolstered by nursing.  Data:  Recent Labs  Lab 10/05/21 0025 10/05/21 0511 10/07/21 0308  NA 135 135 132*  K 3.6 3.5 3.9  CL 101 101 100  CO2 '25 26 24  '$ BUN 23 22 24*  CREATININE 0.75 0.79 0.98  GLUCOSE 101* 117* 110*  CALCIUM  9.3 9.1 8.5*   No results for input(s): "AST", "ALT", "ALKPHOS" in the last 168 hours.  Invalid input(s): "TBILI"   Recent Labs  Lab 10/05/21 0511 10/05/21 1302 10/07/21 0308  WBC 11.7* 10.4 14.9*  HGB 12.3* 12.5* 11.1*  HCT 36.6* 37.2* 33.0*  PLT 153 158 146*   No results for input(s): "APTT", "INR" in the last 168 hours.       Other tests/results:  Pathology pending  MRI pending  Assessment/Plan:  Jason [name] is a 66 y.o s/p C6 laminectomy for resection of epidural mass.  Based on his presentation, I am concerned that he is showing signs of neurological decline due to a lower cervical lesion.  Given recent surgery, a post-operative clot is of highest concern.  If he shows a clot on MRI scan, I will recommend return to OR for washout.  I have already discussed this with Jason [name] with his wife present, as he will require intubation for his MRI scan.  He has severe claustrophobia.  - Appreciate ICU care and hospitalist care.  We have discussed maintaining normal BP with goal MAP>80. - Will determine next course of action based on MRI results. I ordered MRI stat given the concern for post-operative hemorrhage.  Meade Maw MD Department  of Neurosurgery  10/07/21 2120 This is an addendum to the above note.  Jason [name] was transferred to the intensive care unit, where I discussed his care with the intensivist Dr. Tamala Julian.  Due to Jason [name]'s known claustrophobia, we recommended elective intubation so that he was able to get a repeat MRI scan.  Dr. Tamala Julian was able to perform this in addition to placing central venous access as well as an arterial line to closely monitor his blood pressure.  After stabilizing the patient, he was taken to MRI scan.  I have reviewed his MRI scan with the attending radiologist Dr. Nelia Shi.  On my review as well as Dr. Ned Clines, there is no obvious compressive lesion to explain Jason [name] decline in function.  There is no obvious spinal cord  stroke, but this imaging may be too early in the course of Jason [name] clinical decline to show an obvious spinal cord stroke.  With the documented and observed hypotension during the day today, I have some suspicion that spinal cord stroke from hypertension may be the ultimate proximate cause of his decline.  I reviewed the MRI findings with his wife and friends.  With no obvious compressive epidural hematoma or fluid collection to evacuate, I recommended against urgent operative intervention.  I recommended he remain intubated for close monitoring and support of his blood pressure.    I reviewed treatment options with Jason [name].  At this point, we will maintain his mean arterial pressure greater than 80.  He will remain intubated.  I have ordered steroids.  I cleaned and redressed his wound.  I have discussed with pharmacy to obtain riluzole, which may be helpful.  We discussed the dosing which is 100 mg loading dose and 50 mg twice daily for 5 days.  We will watch him closely.  I discussed this case with the overnight intensivist Dr. Erin Fulling.   Meade Maw MD

## 2021-10-07 NOTE — Progress Notes (Addendum)
Cannot move or feel legs, reduced sensation in RLE.  Dressing more saturated in back.  Stat MRI, will need vent due to extreme claustrophobia.  NSGY to review to determine if needs OR washout.  Bedside echo okay.  MAP goal 80.  Wife updated all consent.

## 2021-10-07 NOTE — TOC Initial Note (Signed)
Transition of Care [facility]) - Initial/Assessment Note    Patient Details  Name: Jason [name] MRN: [id number] Date of Birth: 04/23/1955  Transition of Care [facility]) CM/SW Contact:    Laurena Slimmer, RN Phone Number: 10/07/2021, 1:02 PM  Clinical Narrative:                  Transition of Care [facility]) Screening Note   Patient Details  Name: Jason [name] Date of Birth: Nov 18, 1955   Transition of Care [facility]) CM/SW Contact:    Laurena Slimmer, RN Phone Number: 10/07/2021, 1:02 PM    Transition of Care Department [facility]) has reviewed patient and no TOC needs have been identified at this time. We will continue to monitor patient advancement through interdisciplinary progression rounds. If new patient transition needs arise, please place a TOC consult.           Patient Goals and CMS Choice        Expected Discharge Plan and Services                                                Prior Living Arrangements/Services                       Activities of Daily Living Home Assistive Devices/Equipment: None ADL Screening (condition at time of admission) Patient's cognitive ability adequate to safely complete daily activities?: Yes Is the patient deaf or have difficulty hearing?: No Does the patient have difficulty seeing, even when wearing glasses/contacts?: No Does the patient have difficulty concentrating, remembering, or making decisions?: No Patient able to express need for assistance with ADLs?: No Does the patient have difficulty dressing or bathing?: No Independently performs ADLs?: Yes (appropriate for developmental age) Does the patient have difficulty walking or climbing stairs?: Yes Weakness of Legs: None Weakness of Arms/Hands: None  Permission Sought/Granted                  Emotional Assessment              Admission diagnosis:  Cervical spinal stenosis [M48.02] Abnormal MRI [R93.89] Metastatic malignant neoplasm, unspecified  site [facility]) [C79.9] Patient Active Problem List   Diagnosis Date Noted   Elevated PSA    Abnormal MRI    Goals of care, counseling/discussion    Cervical spinal stenosis 10/05/2021   Metastatic disease ([place]) 10/05/2021   Hypertensive urgency 10/05/2021   Left shoulder pain 03/29/2021   PSVT (paroxysmal supraventricular tachycardia) ([place]) 01/25/2021   NSVT (nonsustained ventricular tachycardia) ([place]) 01/25/2021   Hyperlipidemia 01/25/2021   Acute non-recurrent frontal sinusitis 12/27/2020   PVC's (premature ventricular contractions) 03/15/2020   Palpitations 03/14/2020   BPH (benign prostatic hyperplasia) 11/07/2013   Routine general medical examination at a health care facility 06/24/2010   Hypothyroidism 06/19/2009   OSTEOARTHRITIS 12/19/2008   BACK PAIN, LUMBAR 02/26/2007   Essential hypertension 09/17/2006   ALLERGIC RHINITIS 09/17/2006   GERD 09/17/2006   PCP:  Default, Provider, MD Pharmacy:   [facility] [place] [place] [address] [place] [address] Phone: [phone] Fax: [place], [place] [address] Phone: [phone] Fax: [phone]  [place], [place] [address]  [place] Phone: [phone] Fax: [phone]     Social Determinants of Health (SDOH) Interventions    Readmission Risk Interventions     No data to display

## 2021-10-07 NOTE — Progress Notes (Signed)
Around 1 pm patient was noted to have decreased LOC, BP taken and resulted 81/58, recheck was same.  Dr Ouida Sills notified and BP meds d/ced and 1000cc bolus ordered and given.  After bolus pt was much more alert and back to his previous level.  Was able to eat lunch.  Bladder scan also done as he had not urinated on shift.  998cc on scan, I/O cath ordered, 925cc urine taken via I/O cath.  21cc residual.

## 2021-10-07 NOTE — Progress Notes (Signed)
Vasopressors order; RN, Magda Paganini uncertain for need for pressors at this time.  Will place consult if need arises.

## 2021-10-07 NOTE — Progress Notes (Signed)
Transported patient to MRI on vent without incident

## 2021-10-07 NOTE — Progress Notes (Signed)
    Attending Progress Note  History: Jason [name] is s/p C6 laminectomy for resection of epidural mass  POD1: Increased tingling in right hand this morning. Reports good pain control  Physical Exam: Vitals:   10/06/21 2121 10/07/21 0545  BP: (!) 145/94 (!) 149/88  Pulse: (!) 102 88  Resp: 16 16  Temp: 98.9 F (37.2 C) 97.6 F (36.4 C)  SpO2: 94% 95%    AA Ox3 CNI  Strength:5/5 throughout LUE except 3/5 left deltoid due to shoulder surgery. 3/5 right tricep and deltoid otherwise 5/5 throughout RUE.  Incision covered with post-op bandage.  HV 5 post-op  Data:  Recent Labs  Lab 10/05/21 0025 10/05/21 0511 10/07/21 0308  NA 135 135 132*  K 3.6 3.5 3.9  CL 101 101 100  CO2 '25 26 24  '$ BUN 23 22 24*  CREATININE 0.75 0.79 0.98  GLUCOSE 101* 117* 110*  CALCIUM 9.3 9.1 8.5*   No results for input(s): "AST", "ALT", "ALKPHOS" in the last 168 hours.  Invalid input(s): "TBILI"   Recent Labs  Lab 10/05/21 0511 10/05/21 1302 10/07/21 0308  WBC 11.7* 10.4 14.9*  HGB 12.3* 12.5* 11.1*  HCT 36.6* 37.2* 33.0*  PLT 153 158 146*   No results for input(s): "APTT", "INR" in the last 168 hours.       Other tests/results:  Pathology pending   Assessment/Plan:  Jason [name] is a 66 y.o s/p C6 laminectomy for resection of epidural mass  - mobilize - pain control - DVT prophylaxis - PTOT - remainder of care per primary team  Cooper Render PA-C Department of Neurosurgery

## 2021-10-08 ENCOUNTER — Inpatient Hospital Stay: Payer: BC Managed Care – PPO

## 2021-10-08 ENCOUNTER — Telehealth (HOSPITAL_COMMUNITY): Payer: Self-pay | Admitting: Pharmacy Technician

## 2021-10-08 ENCOUNTER — Other Ambulatory Visit (HOSPITAL_COMMUNITY): Payer: Self-pay

## 2021-10-08 ENCOUNTER — Inpatient Hospital Stay
Admit: 2021-10-08 | Discharge: 2021-10-08 | Disposition: A | Discharge status: Home / Self Care | Payer: BC Managed Care – PPO | Attending: Osteopathic Medicine | Admitting: Osteopathic Medicine

## 2021-10-08 DIAGNOSIS — M4802 Spinal stenosis, cervical region: Secondary | ICD-10-CM | POA: Diagnosis not present

## 2021-10-08 LAB — COMPREHENSIVE METABOLIC PANEL
ALT: 18 U/L (ref 0–44)
AST: 50 U/L — ABNORMAL HIGH (ref 15–41)
Albumin: 3.4 g/dL — ABNORMAL LOW (ref 3.5–5.0)
Alkaline Phosphatase: 228 U/L — ABNORMAL HIGH (ref 38–126)
Anion gap: 10 (ref 5–15)
BUN: 26 mg/dL — ABNORMAL HIGH (ref 8–23)
CO2: 22 mmol/L (ref 22–32)
Calcium: 8.3 mg/dL — ABNORMAL LOW (ref 8.9–10.3)
Chloride: 103 mmol/L (ref 98–111)
Creatinine, Ser: 0.84 mg/dL (ref 0.61–1.24)
GFR, Estimated: >60 mL/min (ref >60)
Glucose, Bld: 163 mg/dL — ABNORMAL HIGH (ref 70–99)
Potassium: 4 mmol/L (ref 3.5–5.1)
Sodium: 135 mmol/L (ref 135–145)
Total Bilirubin: 1.2 mg/dL (ref 0.3–1.2)
Total Protein: 6.3 g/dL — ABNORMAL LOW (ref 6.5–8.1)

## 2021-10-08 LAB — ECHOCARDIOGRAM COMPLETE
AR max vel: 1.75 cm2
AV Area VTI: 2.09 cm2
AV Area mean vel: 1.87 cm2
AV Mean grad: 7 mmHg
AV Peak grad: 11.6 mmHg
Ao pk vel: 1.7 m/s
Area-P 1/2: 3.15 cm2
Height: 69 in
S' Lateral: 2.8 cm
Weight: 3220.48 oz

## 2021-10-08 LAB — CBC
HCT: 30.4 % — ABNORMAL LOW (ref 39.0–52.0)
Hemoglobin: 10.2 g/dL — ABNORMAL LOW (ref 13.0–17.0)
MCH: 30.7 pg (ref 26.0–34.0)
MCHC: 33.6 g/dL (ref 30.0–36.0)
MCV: 91.6 fL (ref 80.0–100.0)
Platelets: 142 10*3/uL — ABNORMAL LOW (ref 150–400)
RBC: 3.32 MIL/uL — ABNORMAL LOW (ref 4.22–5.81)
RDW: 13.6 % (ref 11.5–15.5)
WBC: 14.5 10*3/uL — ABNORMAL HIGH (ref 4.0–10.5)
nRBC: 0 % (ref 0.0–0.2)

## 2021-10-08 LAB — PHOSPHORUS: Phosphorus: 4.5 mg/dL (ref 2.5–4.6)

## 2021-10-08 LAB — HEMOGLOBIN A1C
Hgb A1c MFr Bld: 5.5 % (ref 4.8–5.6)
Mean Plasma Glucose: 111.15 mg/dL

## 2021-10-08 LAB — GLUCOSE, CAPILLARY
Glucose-Capillary: 131 mg/dL — ABNORMAL HIGH (ref 70–99)
Glucose-Capillary: 151 mg/dL — ABNORMAL HIGH (ref 70–99)
Glucose-Capillary: 113 mg/dL — ABNORMAL HIGH (ref 70–99)
Glucose-Capillary: 110 mg/dL — ABNORMAL HIGH (ref 70–99)

## 2021-10-08 LAB — LACTIC ACID, PLASMA: Lactic Acid, Venous: 1.6 mmol/L (ref 0.5–1.9)

## 2021-10-08 LAB — TRIGLYCERIDES: Triglycerides: 117 mg/dL (ref <150)

## 2021-10-08 LAB — MAGNESIUM: Magnesium: 2.2 mg/dL (ref 1.7–2.4)

## 2021-10-08 MED ORDER — MIDAZOLAM HCL 2 MG/2ML IJ SOLN
4.0000 mg | Freq: Once | INTRAMUSCULAR | Status: AC
Start: 2021-10-08 — End: 2021-10-08
  Administered 2021-10-08: 4 mg via INTRAVENOUS

## 2021-10-08 MED ORDER — INSULIN ASPART 100 UNIT/ML IJ SOLN
0.0000 [IU] | Freq: Every day | INTRAMUSCULAR | Status: DC
  Filled 2021-10-08: qty 1

## 2021-10-08 MED ORDER — INSULIN ASPART 100 UNIT/ML IJ SOLN: 0.0000 [IU] | INTRAMUSCULAR | Status: DC

## 2021-10-08 MED ORDER — RILUZOLE 50 MG PO TABS
100.0000 mg | ORAL_TABLET | Freq: Once | ORAL | Status: DC
Start: 2021-10-09 — End: 2021-10-09
  Filled 2021-10-08: qty 2

## 2021-10-08 MED ORDER — INSULIN ASPART 100 UNIT/ML IJ SOLN
0.0000 [IU] | Freq: Three times a day (TID) | INTRAMUSCULAR | Status: DC
  Administered 2021-10-09: 1 [IU] via SUBCUTANEOUS
  Administered 2021-10-10: 2 [IU] via SUBCUTANEOUS
  Administered 2021-10-10: 1 [IU] via SUBCUTANEOUS
  Filled 2021-10-08 (×3): qty 1

## 2021-10-08 MED ORDER — RILUZOLE 50 MG PO TABS: 50.0000 mg | ORAL_TABLET | Freq: Two times a day (BID) | ORAL | Status: DC

## 2021-10-08 MED ORDER — GADOBUTROL 1 MMOL/ML IV SOLN
9.0000 mL | Freq: Once | INTRAVENOUS | Status: AC | PRN
  Administered 2021-10-08: 9 mL via INTRAVENOUS

## 2021-10-08 MED ORDER — ACETAMINOPHEN 10 MG/ML IV SOLN
1000.0000 mg | Freq: Three times a day (TID) | INTRAVENOUS | Status: AC | PRN
  Administered 2021-10-08: 1000 mg via INTRAVENOUS
  Filled 2021-10-08 (×2): qty 100

## 2021-10-08 MED ORDER — MIDAZOLAM HCL 2 MG/2ML IJ SOLN
INTRAMUSCULAR | Status: AC
  Filled 2021-10-08: qty 4

## 2021-10-08 MED ORDER — LACTATED RINGERS IV BOLUS
500.0000 mL | Freq: Once | INTRAVENOUS | Status: AC
  Administered 2021-10-08: 500 mL via INTRAVENOUS

## 2021-10-08 MED ORDER — FAMOTIDINE IN NACL 20-0.9 MG/50ML-% IV SOLN
20.0000 mg | Freq: Every day | INTRAVENOUS | Status: DC
  Administered 2021-10-08 – 2021-10-10 (×3): 20 mg via INTRAVENOUS
  Filled 2021-10-08 (×3): qty 50

## 2021-10-08 MED ORDER — FENTANYL CITRATE PF 50 MCG/ML IJ SOSY: 25.0000 ug | PREFILLED_SYRINGE | INTRAMUSCULAR | Status: DC | PRN

## 2021-10-08 NOTE — Progress Notes (Signed)
Rt assisted with patient transport to MRI and back with no complications. Patient transported on trilogy ventilator.

## 2021-10-08 NOTE — Telephone Encounter (Signed)
Pharmacy Patient Advocate Encounter  Insurance verification completed.    The patient is insured through [employer]   The patient is currently admitted and ran test claims for the following: riluzole (Rilutek) 50 mg tablets..  Copays and coinsurance results were relayed to Inpatient clinical team.

## 2021-10-08 NOTE — Progress Notes (Signed)
NAME:  Jason [name], MRN:  [id number], DOB:  28-Feb-1955, LOS: 3 ADMISSION DATE:  10/04/2021, CONSULTATION DATE:  10/07/21 REFERRING MD:  Sheppard Coil, CHIEF COMPLAINT:  weakness   History of Present Illness:  66 year old man who presented with neck/shoulder pain found to have widely metastatic cancer suspicious for prostate s/p emergent C6 cervical laminectomy with resection of epidural spinal tumor.  He is POD 1 and having some worsening shock despite fluids.  Becoming more confused, lethargic so PCCM consulted.  Pertinent  Medical History  HTN Hypothyroidism Allergy GERD  Arthritis   Significant Hospital Events: Including procedures, antibiotic start and stop dates in addition to other pertinent events   8/12 admitted with chronic neck pain and worsening L hand paresthesias, L arm weakness 8/14  Pt transferred to ICU with hypotension along with significant weakness in upper and lower extremities.  Required mechanical intubation for MRI Cervical Spine due to severe claustrophobia  8/14: MR Cervical Spine revealed interval bilateral C6        hemilaminectomies. Similar degree of epidural tumor from          C4-C7 without definite superimposed epidural hematoma or        fluid collection. Progressive spinal canal stenosis from C4-C7,        severe at C5-C6 and C6-C7. No abnormal spinal cord signal or        cord infarct. Similar diffuse cervical osseous metastatic disease. 8/15: Pt remains mechanically intubated.  Orders placed for MR Brain, Lumbar Spine, and Thoracic Spine to further assess etiology of acute BLE weakness/numbness    Interim History / Subjective:  Overnight RN reports this am pt able to follow commands during bilateral upper extremity assessment, however unable to move/follow commands during bilateral lower extremity assessment.  However, during assessment pt sedated with propofol and fentanyl gtts.  He is currently requiring 6 mcg/min of levophed gtt to maintain map  >80  Objective   Blood pressure (!) 116/51, pulse (!) 56, temperature 97.9 F (36.6 C), temperature source Axillary, resp. rate 18, height _0  (1.753 m), weight 91.3 kg, SpO2 99 %.    Vent Mode: PRVC FiO2 (%):  [40 %] 40 % Set Rate:  [18 bmp] 18 bmp Vt Set:  [560 mL] 560 mL PEEP:  [5 cmH20] 5 cmH20 Plateau Pressure:  [15 cmH20-16 cmH20] 16 cmH20   Intake/Output Summary (Last 24 hours) at 10/08/2021 0800 Last data filed at 10/08/2021 8841 Gross per 24 hour  Intake 1358.64 ml  Output 3325 ml  Net -1966.36 ml   Filed Weights   10/04/21 1656 10/07/21 1645  Weight: 85 kg 91.3 kg    Examination: General: Acutely ill appearing male, sedated NAD mechanically intubated  HENT: MMM, trachea midline, Neck surgical site ecchymosis present, dressing intact, 10 Fr hemovac present posterior neck  Lungs: Clear throughout, even, non labored  Cardiovascular: Ventricular bigeminy, no r/g, 2+ radial/2+ distal pulses present, no edema  Abdomen: +BS x4, soft, non distended  Extremities:  Neuro: Sedated, intermittently following commands BUE, unable to follow commands BLE, globally weak, PERRL  Resolved Hospital Problem list   N/A  Assessment & Plan:  Mechanical intubation for airway protection  - Full vent support for now: vent settings reviewed and established - SBT once all parameters met  - VAP bundle implemented - Will remain mechanically intubated today for additional testing to determine etiology of acute onset of global weakness  - Maintain RASS goal -1 for now - PAD protocol: Fentanyl and  propofol gtts to maintain RASS goal   Postop hypotension suspect secondary to spinal shock~sepsis workup negative  Hx. HTN  - Continuous telemetry monitoring  - Per neurosurgery maintain map >80  - Prn levophed gtt to maintain map goal  - Hold outpatient antihypertensives   New metastatic cancer with pelvic adenopathy, multiple spinal mets-severely elevated PSA, c/w metastatic prostate but path  pending - Oncology consulted appreciate input   Leukocytosis no signs of infection  - Trend WBC and monitor fever curve - Repeat lactic acid  - Will discontinue all abx therapy   Hypothyroidism  - Continue synthroid   Steroid induced hyperglycemia  - Hemoglobin A1c pending  - SSI  - CBG's q4hrs   Acute onset of global weakness~MR Cervical Spine 08/14 negative for obvious compressive epidural hematoma or fluid collection  - Neurosurgery consulted appreciate input: per recommendations pt to receive riluzole - Continue decadron  - MR Lumbar Spine/Thoracic Spine/Brain W WO Contrast pending to further assess cause of global weakness   Best Practice (right click and "Reselect all SmartList Selections" daily)   Diet/type: NPO DVT prophylaxis: SCD GI prophylaxis: H2B Lines: Left internal jugular CVL  Foley:  Yes, and it is still needed Code Status:  full code Last date of multidisciplinary goals of care discussion [10/08/2021]  Pts wife updated regarding current plan of care and all questions were answered 10/08/2021 Labs   CBC: Recent Labs  Lab 10/05/21 0025 10/05/21 0511 10/05/21 1302 10/07/21 0308 10/07/21 2117 10/08/21 0355  WBC 12.7* 11.7* 10.4 14.9* 13.9* 14.5*  NEUTROABS 8.3*  --  7.5  --   --   --   HGB 13.0 12.3* 12.5* 11.1* 9.8* 10.2*  HCT 38.7* 36.6* 37.2* 33.0* 28.9* 30.4*  MCV 91.1 92.2 90.1 89.9 90.6 91.6  PLT 158 153 158 146* 148* 142*    Basic Metabolic Panel: Recent Labs  Lab 10/05/21 0025 10/05/21 0511 10/07/21 0308 10/07/21 2117 10/08/21 0355  NA 135 135 132* 132* 135  K 3.6 3.5 3.9 3.6 4.0  CL 101 101 100 100 103  CO2 _0 GLUCOSE 101* 117* 110* 137* 163*  BUN 23 22 24* 30* 26*  CREATININE 0.75 0.79 0.98 0.99 0.84  CALCIUM 9.3 9.1 8.5* 8.2* 8.3*  MG  --   --   --   --  2.2  PHOS  --   --   --   --  4.5   GFR: Estimated Creatinine Clearance: 100.5 mL/min (by C-G formula based on SCr of 0.84 mg/dL). Recent Labs  Lab  10/05/21 1302 10/07/21 0308 10/07/21 2117 10/07/21 2305 10/08/21 0355  PROCALCITON  --  <0.10  --   --   --   WBC 10.4 14.9* 13.9*  --  14.5*  LATICACIDVEN  --   --  1.5 2.8*  --     Liver Function Tests: Recent Labs  Lab 10/08/21 0355  AST 50*  ALT 18  ALKPHOS 228*  BILITOT 1.2  PROT 6.3*  ALBUMIN 3.4*   No results for input(s): "LIPASE", "AMYLASE" in the last 168 hours. No results for input(s): "AMMONIA" in the last 168 hours.  ABG    Component Value Date/Time   PHART 7.46 (H) 10/07/2021 1654   PCO2ART 34 10/07/2021 1654   PO2ART 60 (L) 10/07/2021 1654   HCO3 24.2 10/07/2021 1654   O2SAT 92.5 10/07/2021 1654     Coagulation Profile: No results for input(s): "INR", "PROTIME" in the last 168 hours.  Cardiac  Enzymes: No results for input(s): "CKTOTAL", "CKMB", "CKMBINDEX", "TROPONINI" in the last 168 hours.  HbA1C: No results found for: "HGBA1C"  CBG: Recent Labs  Lab 10/07/21 1643 10/08/21 0729  GLUCAP 131* 151*    Review of Systems:    Positive Symptoms in bold:  Constitutional fevers, chills, weight loss, fatigue, anorexia, malaise  Eyes decreased vision, double vision, eye irritation  Ears, Nose, Mouth, Throat sore throat, trouble swallowing, sinus congestion  Cardiovascular chest pain, paroxysmal nocturnal dyspnea, lower ext edema, palpitations   Respiratory SOB, cough, DOE, hemoptysis, wheezing  Gastrointestinal nausea, vomiting, diarrhea  Genitourinary burning with urination, trouble urinating  Musculoskeletal joint aches, joint swelling, back pain  Integumentary  rashes, skin lesions  Neurological focal weakness, focal numbness, trouble speaking, headaches  Psychiatric depression, anxiety, confusion  Endocrine polyuria, polydipsia, cold intolerance, heat intolerance  Hematologic abnormal bruising, abnormal bleeding, unexplained nose bleeds  Allergic/Immunologic recurrent infections, hives, swollen lymph nodes     Past Medical History:   He,  has a past medical history of Allergy, Arthritis, GERD (gastroesophageal reflux disease), Hypertension, Hypothyroidism, and Thyroid disease.   Surgical History:   Past Surgical History:  Procedure Laterality Date   CARDIAC CATHETERIZATION  03/2000   HERNIA REPAIR  9211   umbilical   LEFT HEART CATH AND CORONARY ANGIOGRAPHY Left 03/20/2020   Procedure: LEFT HEART CATH AND CORONARY ANGIOGRAPHY;  Surgeon: Nelva Bush, MD;  Location: [place] [facility];  Service: Cardiovascular;  Laterality: Left;   POSTERIOR CERVICAL FUSION/FORAMINOTOMY N/A 10/06/2021   Procedure: POSTERIOR CERVICAL FUSION/ FORAMINOTOMY LEVEL 3;  Surgeon: Meade Maw, MD;  Location: [facility];  Service: Neurosurgery;  Laterality: N/A;  will need monitoring   RADIOLOGY WITH ANESTHESIA Left 04/18/2021   Procedure: MRI SHOULDER WITHOUT CONTRAST WITH ANESTHESIA;  Surgeon: Radiologist, Medication, MD;  Location: [facility];  Service: Radiology;  Laterality: Left;   RADIOLOGY WITH ANESTHESIA N/A 10/03/2021   Procedure: MRI CERVICAL SPINE WITH ANESTHESIA;  Surgeon: Radiologist, Medication, MD;  Location: [place];  Service: Radiology;  Laterality: N/A;   REVERSE SHOULDER ARTHROPLASTY Left    ROTATOR CUFF REPAIR  11/2010   Dr [name] Heck   TONSILLECTOMY     TOTAL HIP ARTHROPLASTY Right 02/24/2009   TOTAL HIP ARTHROPLASTY Left 06/2016   Dr Harlow Mares     Social History:   reports that he has never smoked. He has never used smokeless tobacco. He reports that he does not currently use alcohol. He reports that he does not use drugs.   Family History:  His family history includes Gout in his father; Heart attack (age of onset: 56) in his father; Heart disease in his sister; Hypertension in his mother; Stroke in his mother. There is no history of Colon cancer, Esophageal cancer, Rectal cancer, or Stomach cancer.   Allergies No Known Allergies   Home Medications  Prior to Admission medications   Medication Sig Start Date End Date  Taking? Authorizing Provider  acetaminophen (TYLENOL) 500 MG tablet Take 1,000 mg by mouth in the morning, at noon, and at bedtime.   Yes [provider]  Ascorbic Acid (VITA-C PO) Take 1,000 mg by mouth daily.   Yes [provider]  cetirizine (ZYRTEC) 10 MG tablet Take 10 mg by mouth at bedtime.   Yes [provider]  CRANBERRY PO Take 500 mg by mouth at bedtime.   Yes [provider]  cyclobenzaprine (FLEXERIL) 10 MG tablet TAKE 1 TABLET BY MOUTH EVERY DAY AT NIGHT AS NEEDED Patient taking differently: Take  10 mg by mouth at bedtime. TAKE 1 TABLET BY MOUTH EVERY DAY AT NIGHT AS NEEDED 08/19/21  Yes Venia Carbon, MD  levothyroxine (SYNTHROID) 88 MCG tablet TAKE 1 TABLET BY MOUTH EVERY DAY Patient taking differently: Take 88 mcg by mouth daily before breakfast. 09/23/21  Yes Viviana Simpler I, MD  lisinopril (ZESTRIL) 20 MG tablet TAKE 1 TABLET BY MOUTH EVERY DAY 08/19/21  Yes End, Harrell Gave, MD  loratadine (CLARITIN) 10 MG tablet Take 10 mg by mouth in the morning and at bedtime.   Yes [provider]  meloxicam (MOBIC) 15 MG tablet Take 1 tablet (15 mg total) by mouth daily. 03/12/21  Yes Paulette Blanch, MD  metoprolol succinate (TOPROL-XL) 100 MG 24 hr tablet TAKE 1 TABLET BY MOUTH EVERY DAY WITH OR IMMEDIATELY FOLLOWING A MEAL Patient taking differently: Take 100 mg by mouth daily. 09/23/21  Yes End, Harrell Gave, MD  Multiple Vitamin (MULTIVITAMIN) tablet Take 1 tablet by mouth daily.   Yes [provider]  potassium chloride (KLOR-CON) 10 MEQ tablet TAKE 1 TABLET BY MOUTH EVERY DAY Patient taking differently: Take 10 mEq by mouth at bedtime. 09/23/21  Yes End, Harrell Gave, MD  rosuvastatin (CRESTOR) 5 MG tablet TAKE 1 TABLET (5 MG TOTAL) BY MOUTH DAILY. 09/23/21 12/22/21 Yes End, Harrell Gave, MD  spironolactone (ALDACTONE) 25 MG tablet TAKE 1/2 TABLET BY MOUTH EVERY DAY 09/23/21  Yes End, Harrell Gave, MD  tamsulosin (FLOMAX) 0.4 MG CAPS  capsule TAKE 1 CAPSULE BY MOUTH EVERY DAY 09/27/20  Yes Viviana Simpler I, MD  torsemide (DEMADEX) 10 MG tablet TAKE 1 TABLET BY MOUTH EVERY DAY 08/19/21  Yes End, Harrell Gave, MD  amoxicillin (AMOXIL) 500 MG capsule Take 2,000 mg by mouth See admin instructions. Take 4 tablets by mouth prior to dental procedures. Patient not taking: Reported on 10/05/2021 07/12/20   [provider]  Menthol, Topical Analgesic, (ICY HOT) 5 % PTCH Apply 1-2 patches topically daily as needed (pain).    [provider]  oxyCODONE-acetaminophen (PERCOCET/ROXICET) 5-325 MG tablet Take 1 tablet by mouth every 4 (four) hours as needed for severe pain. 05/06/21   Venia Carbon, MD  sildenafil (REVATIO) 20 MG tablet Take 3-5 tablets (60-100 mg total) by mouth daily as needed. 10/05/18   Venia Carbon, MD     Critical care time: 6 minutes      Donell Beers, [place] Pager [phone] (please enter 7 digits) PCCM Consult Pager [phone] (please enter 7 digits)

## 2021-10-08 NOTE — Progress Notes (Signed)
Pt extubated per order, placed on 4l [place]

## 2021-10-08 NOTE — Progress Notes (Addendum)
I called pharmacy and spoke with Chrys Racer. Jason [name] has not been given yet Patient declined Jason [name] due to concern of cost.

## 2021-10-08 NOTE — Progress Notes (Signed)
Initial Nutrition Assessment  DOCUMENTATION CODES:   Not applicable  INTERVENTION:   If tube feeds initiated, recommend:  Vital HP '@40ml'$ /hr + ProSource TF 20 BID via tube  Propofol: 27.4 ml/hr- provides 723kcal/day   Free water flushes 42m q4 hours to maintain tube patency   Regimen provides 1843kcal/day, 124g/day protein and 9844mday of free water   Pt at high refeed risk; recommend monitor potassium, magnesium and phosphorus labs daily until stable  NUTRITION DIAGNOSIS:   Inadequate oral intake related to inability to eat (pt sedated and ventilated) as evidenced by mild fat depletion, moderate fat depletion, moderate muscle depletion.  GOAL:   Provide needs based on ASPEN/SCCM guidelines  MONITOR:   Vent status, Labs, Weight trends, Skin, I & O's  REASON FOR ASSESSMENT:   Ventilator    ASSESSMENT:   Jason [name] with h/o hypothyroidism, HTN, GERD, BPH and umbilical hernia repair (1961) who is admitted with new metastatic disease to bone with epidural cord compression suspected from prostate cancer now s/p C6 laminectomy for resection of epidural mass 8/13.  Pt sedated and ventilated. OGT in place. Plan is for MRI today and then possible extubation. Will plan to initiate tube feeds if pt does not extubate. Pt eating 100% of meals in hospital prior to intubation. Per chart, pt appears weight stable at baseline.   Medications reviewed and include: vitamin C, dexamethasone, colace, insulin, synthroid, mobic, MVI, miralax, Kcl, pepcid, levophed, propofol   Labs reviewed: K 4.0 wnl, BUN 26(H), P 4.5 wnl, Mg 2.2 wnl Wbc- 14.5(H), Hgb 10.2(L), Hct 30.4(L) Cbgs- 151, 131 x 48 hrs  Patient is currently intubated on ventilator support MV: 10 L/min Temp (24hrs), Avg:98.1 F (36.7 C), Min:97.6 F (36.4 C), Max:98.7 F (37.1 C)  Propofol: 27.4 ml/hr- provides 723kcal/day   MAP- >6537m   UOP- 3325m86mNUTRITION - FOCUSED PHYSICAL EXAM:  Flowsheet Row Most Recent  Value  Orbital Region No depletion  Upper Arm Region Moderate depletion  Thoracic and Lumbar Region Mild depletion  Buccal Region No depletion  Temple Region Moderate depletion  Clavicle Bone Region No depletion  Clavicle and Acromion Bone Region No depletion  Scapular Bone Region No depletion  Dorsal Hand No depletion  Patellar Region Moderate depletion  Anterior Thigh Region Mild depletion  Posterior Calf Region Mild depletion  Edema (RD Assessment) None  Hair Reviewed  Eyes Reviewed  Mouth Reviewed  Skin Reviewed  Nails Reviewed   Diet Order:   Diet Order             Diet NPO time specified  Diet effective now                  EDUCATION NEEDS:   No education needs have been identified at this time  Skin:  Skin Assessment: Reviewed RN Assessment (cervical incision)  Last BM:  8/13  Height:   Ht Readings from Last 1 Encounters:  10/07/21 '5\' 9"'$  (1.753 m)    Weight:   Wt Readings from Last 1 Encounters:  10/07/21 91.3 kg    Ideal Body Weight:  72.7 kg  BMI:  Body mass index is 29.72 kg/m.  Estimated Nutritional Needs:   Kcal:  1702kcal/day  Protein:  110-125g/day  Fluid:  2.2-2.5L/day  CaseKoleen Distance RD, LDN Please refer to [facility] RD and/or RD on-call/weekend/after hours pager

## 2021-10-08 NOTE — Progress Notes (Signed)
Extubated successfully following commands with RT at bedside. Wife updated and brought to bedside. Patient resting comfortably with vitals WDL at this time.

## 2021-10-08 NOTE — Progress Notes (Signed)
    Attending Progress Note  History: Jason [name] is s/p C6 laminectomy for resection of epidural mass  POD2: Pt was transferred to the ICU after yesterdays events. Pt remains intubated and sedated this morning. Wife at bedside.   POD1 PM: I was contacted to see Jason [name] at approximately 1615.  I saw him in his room at approximately 1620, and was able to review the clinical situation with his wife.  From review with her, it seems that Jason [name] was having some issues with his arms when she arrived at around 23, when he reported some difficulty feeding himself with lunch.  Per nursing, his nurses also noted that he was having some difficulty with movement of his arms and legs, but he improved with a fluid bolus around 1300.   When I saw him at 1620, he was arousable but not readily interactive with examination.  He moved his arms but was not reliable. He was noted to be significantly hypotensive.  ICU attending Dr. Tamala Julian, hospitalist Dr. Sheppard Coil, and I reviewed the case and all agreed transfer to ICU was appropriate.  This was done, and his blood pressure improved with another fluid bolus.  I then reevaluated him, noted below.  POD1 AM: Increased tingling in right hand this morning. Reports good pain control  Physical Exam: Vitals:   10/08/21 0700 10/08/21 0735  BP: (!) 116/51   Pulse: (!) 56   Resp: 18   Temp:    SpO2: 98% 99%    AA Ox3 CNI  5/5 R deltoid and biceps, 3/5 L deltoid and 5/5 L biceps.  No clear tone in L triceps.  He is able to extend but not flex his wrists.  He has no finger extension or flexion.  He cannot intentionally move his legs.    He reports normal sensation in his arms except his hands,which are tingling bilaterally.  He can feel light touch in his trunk bilaterally, as well as LLE.  RLE he cannot feel.  Dressing - saturated.  It was bolstered by nursing.  Data:  Recent Labs  Lab 10/07/21 0308 10/07/21 2117 10/08/21 0355  NA 132* 132* 135   K 3.9 3.6 4.0  CL 100 100 103  CO2 '24 22 22  '$ BUN 24* 30* 26*  CREATININE 0.98 0.99 0.84  GLUCOSE 110* 137* 163*  CALCIUM 8.5* 8.2* 8.3*    Recent Labs  Lab 10/08/21 0355  AST 50*  ALT 18  ALKPHOS 228*     Recent Labs  Lab 10/07/21 0308 10/07/21 2117 10/08/21 0355  WBC 14.9* 13.9* 14.5*  HGB 11.1* 9.8* 10.2*  HCT 33.0* 28.9* 30.4*  PLT 146* 148* 142*    No results for input(s): "APTT", "INR" in the last 168 hours.       Other tests/results:  Pathology pending  MRI pending  Assessment/Plan:  Jason [name] is a 65 y.o s/p C6 laminectomy for resection of epidural mass.  - Appreciate ICU care and hospitalist care.  We have discussed maintaining normal BP with goal MAP>80. - Continue steroids - Please call with any questions or concerns  Cooper Render PA-C Department of Neurosurgery

## 2021-10-08 NOTE — Progress Notes (Signed)
Brief note - following peripherally in anticipation of eventual transfer back to [facility]. Patient is still requiring pressor support but he is off ventilator and alert, conversant. Wife present. Pt is of course discouraged by his condition, he is not sure how aggressive he wants to be with treatment but is anticipating discussion w/ oncology to answer some questions. Reviewed MRI results. Questions answered. No other concerns at the moment.

## 2021-10-08 NOTE — Progress Notes (Signed)
Updated pts wife regarding abnormal MR Brain/Lumbar Spine/Thoracic Spine results and all questions answered.  Donell Beers, [place] Pager [phone] (please enter 7 digits) PCCM Consult Pager [phone] (please enter 7 digits)

## 2021-10-08 NOTE — Progress Notes (Signed)
*  PRELIMINARY RESULTS* Echocardiogram 2D Echocardiogram has been performed.  Jason [name] 10/08/2021, 9:33 AM

## 2021-10-08 NOTE — TOC Benefit Eligibility Note (Signed)
Patient Teacher, English as a foreign language completed.    The patient is currently admitted and upon discharge could be taking riluzole (Rilutek) 50 mg tablets.  The current 30 day co-pay is $0.00.   The patient is insured through [place], [place] Patient Advocate Specialist [place] Patient Advocate Team Direct Number: [phone]  Fax: [phone]

## 2021-10-09 ENCOUNTER — Other Ambulatory Visit: Payer: Self-pay | Admitting: Anatomic Pathology & Clinical Pathology

## 2021-10-09 ENCOUNTER — Encounter: Payer: Self-pay | Admitting: Neurosurgery

## 2021-10-09 ENCOUNTER — Inpatient Hospital Stay: Payer: BC Managed Care – PPO

## 2021-10-09 DIAGNOSIS — M4802 Spinal stenosis, cervical region: Secondary | ICD-10-CM | POA: Diagnosis not present

## 2021-10-09 DIAGNOSIS — C61 Malignant neoplasm of prostate: Secondary | ICD-10-CM | POA: Diagnosis present

## 2021-10-09 DIAGNOSIS — Z7189 Other specified counseling: Secondary | ICD-10-CM | POA: Diagnosis not present

## 2021-10-09 LAB — COMPREHENSIVE METABOLIC PANEL
ALT: 22 U/L (ref 0–44)
AST: 42 U/L — ABNORMAL HIGH (ref 15–41)
Albumin: 3 g/dL — ABNORMAL LOW (ref 3.5–5.0)
Alkaline Phosphatase: 202 U/L — ABNORMAL HIGH (ref 38–126)
Anion gap: 5 (ref 5–15)
BUN: 29 mg/dL — ABNORMAL HIGH (ref 8–23)
CO2: 23 mmol/L (ref 22–32)
Calcium: 8.2 mg/dL — ABNORMAL LOW (ref 8.9–10.3)
Chloride: 108 mmol/L (ref 98–111)
Creatinine, Ser: 0.69 mg/dL (ref 0.61–1.24)
GFR, Estimated: >60 mL/min (ref >60)
Glucose, Bld: 130 mg/dL — ABNORMAL HIGH (ref 70–99)
Potassium: 4.1 mmol/L (ref 3.5–5.1)
Sodium: 136 mmol/L (ref 135–145)
Total Bilirubin: 1.4 mg/dL — ABNORMAL HIGH (ref 0.3–1.2)
Total Protein: 6 g/dL — ABNORMAL LOW (ref 6.5–8.1)

## 2021-10-09 LAB — CBC
HCT: 30.4 % — ABNORMAL LOW (ref 39.0–52.0)
Hemoglobin: 10 g/dL — ABNORMAL LOW (ref 13.0–17.0)
MCH: 30.7 pg (ref 26.0–34.0)
MCHC: 32.9 g/dL (ref 30.0–36.0)
MCV: 93.3 fL (ref 80.0–100.0)
Platelets: 137 10*3/uL — ABNORMAL LOW (ref 150–400)
RBC: 3.26 MIL/uL — ABNORMAL LOW (ref 4.22–5.81)
RDW: 13.8 % (ref 11.5–15.5)
WBC: 16.4 10*3/uL — ABNORMAL HIGH (ref 4.0–10.5)
nRBC: 0 % (ref 0.0–0.2)

## 2021-10-09 LAB — GLUCOSE, CAPILLARY
Glucose-Capillary: 122 mg/dL — ABNORMAL HIGH (ref 70–99)
Glucose-Capillary: 100 mg/dL — ABNORMAL HIGH (ref 70–99)
Glucose-Capillary: 129 mg/dL — ABNORMAL HIGH (ref 70–99)
Glucose-Capillary: 134 mg/dL — ABNORMAL HIGH (ref 70–99)

## 2021-10-09 LAB — URINE CULTURE: Culture: NO GROWTH

## 2021-10-09 LAB — PHOSPHORUS: Phosphorus: 3.3 mg/dL (ref 2.5–4.6)

## 2021-10-09 LAB — MAGNESIUM: Magnesium: 2.6 mg/dL — ABNORMAL HIGH (ref 1.7–2.4)

## 2021-10-09 LAB — SURGICAL PATHOLOGY

## 2021-10-09 MED ORDER — RILUZOLE 50 MG PO TABS
100.0000 mg | ORAL_TABLET | Freq: Once | ORAL | Status: AC
  Administered 2021-10-09: 100 mg via ORAL
  Filled 2021-10-09: qty 2

## 2021-10-09 MED ORDER — RILUZOLE 50 MG PO TABS
50.0000 mg | ORAL_TABLET | Freq: Two times a day (BID) | ORAL | Status: AC
  Administered 2021-10-09 – 2021-10-14 (×9): 50 mg via ORAL
  Filled 2021-10-09 (×10): qty 1

## 2021-10-09 MED ORDER — ENSURE ENLIVE PO LIQD
237.0000 mL | Freq: Three times a day (TID) | ORAL | Status: DC
  Administered 2021-10-09 – 2021-10-11 (×4): 237 mL via ORAL

## 2021-10-09 MED ORDER — DM-GUAIFENESIN ER 30-600 MG PO TB12
1.0000 | ORAL_TABLET | Freq: Two times a day (BID) | ORAL | Status: DC
  Administered 2021-10-09 – 2021-10-17 (×16): 1 via ORAL
  Filled 2021-10-09 (×16): qty 1

## 2021-10-09 MED ORDER — POLYETHYLENE GLYCOL 3350 17 G PO PACK
17.0000 g | PACK | Freq: Every day | ORAL | Status: DC
  Administered 2021-10-10 – 2021-10-11 (×2): 17 g via ORAL
  Filled 2021-10-09 (×2): qty 1

## 2021-10-09 MED ORDER — DOCUSATE SODIUM 100 MG PO CAPS
100.0000 mg | ORAL_CAPSULE | Freq: Two times a day (BID) | ORAL | Status: DC
  Administered 2021-10-09 – 2021-10-17 (×16): 100 mg via ORAL
  Filled 2021-10-09 (×16): qty 1

## 2021-10-09 MED ORDER — SALINE SPRAY 0.65 % NA SOLN
1.0000 | NASAL | Status: DC | PRN
  Administered 2021-10-09: 1 via NASAL
  Filled 2021-10-09: qty 44

## 2021-10-09 MED ORDER — DEGARELIX ACETATE(240 MG DOSE) 120 MG/VIAL ~~LOC~~ SOLR
240.0000 mg | Freq: Once | SUBCUTANEOUS | Status: AC
  Administered 2021-10-09: 240 mg via SUBCUTANEOUS
  Filled 2021-10-09: qty 6

## 2021-10-09 MED ORDER — DEXAMETHASONE SODIUM PHOSPHATE 4 MG/ML IJ SOLN
2.0000 mg | Freq: Two times a day (BID) | INTRAMUSCULAR | Status: DC
  Administered 2021-10-10 (×2): 2 mg via INTRAVENOUS
  Filled 2021-10-09 (×3): qty 1

## 2021-10-09 MED ORDER — MELATONIN 5 MG PO TABS
5.0000 mg | ORAL_TABLET | Freq: Every evening | ORAL | Status: DC | PRN
  Administered 2021-10-09: 5 mg via ORAL
  Filled 2021-10-09: qty 1

## 2021-10-09 NOTE — Progress Notes (Signed)
Nutrition Follow Up Note   DOCUMENTATION CODES:   Not applicable  INTERVENTION:   Ensure Enlive po TID, each supplement provides 350 kcal and 20 grams of protein.  MVI po daily   Pt at high refeed risk; recommend monitor potassium, magnesium and phosphorus labs daily until stable  NUTRITION DIAGNOSIS:   Inadequate oral intake related to inability to eat (pt sedated and ventilated) as evidenced by mild fat depletion, moderate fat depletion, moderate muscle depletion.  GOAL:   Patient will meet greater than or equal to 90% of their needs -progressing   MONITOR:   PO intake, Supplement acceptance, Labs, Weight trends, Skin, I & O's  ASSESSMENT:   66 y/o male with h/o hypothyroidism, HTN, GERD, BPH and umbilical hernia repair (1961) who is admitted with new metastatic disease to bone with epidural cord compression suspected from prostate cancer now s/p C6 laminectomy for resection of epidural mass 8/13.  Pt extubated yesterday. Pt seen by [name] today and initiated on a regular diet. Pt sitting up in bed today at time of RD visit. RD will add supplements to help pt meet his estimated needs. Pt is at refeed risk. Per chart, pt appears to be at his UBW. Pt +1.4L on his I & Os.   Medications reviewed and include: vitamin C, dexamethasone, colace, insulin, synthroid, mobic, MVI, miralax, KCl, pepcid, levophed  Labs reviewed: K 4.1 wnl, BUN 29(H), P 3.3 wnl, Mg 2.6(H) Wbc- 16.4(H), Hgb 10.0(L), Hct 30.4(L) Cbgs- 122, 110, 113, 151 x 48 hrs  Diet Order:    Diet Order             Diet regular Room service appropriate? Yes with Assist; Fluid consistency: Thin  Diet effective now                  EDUCATION NEEDS:   No education needs have been identified at this time  Skin:  Skin Assessment: Reviewed RN Assessment (cervical incision)  Last BM:  8/13  Height:   Ht Readings from Last 1 Encounters:  10/07/21 '5\' 9"'$  (1.753 m)    Weight:   Wt Readings from Last 1  Encounters:  10/07/21 91.3 kg    Ideal Body Weight:  72.7 kg  BMI:  Body mass index is 29.72 kg/m.  Estimated Nutritional Needs:   Kcal:  2000-2300kcal/day  Protein:  100-115g/day  Fluid:  2.2-2.5L/day  Koleen Distance MS, RD, LDN Please refer to [facility] - Eat for RD and/or RD on-call/weekend/after hours pager

## 2021-10-09 NOTE — Evaluation (Addendum)
Clinical/Bedside Swallow Evaluation Patient Details  Name: Jason [name] MRN: [id number] Date of Birth: [birth year]  Today's Date: 10/09/2021 Time: [name] Start Time (ACUTE ONLY): 24 [name] Stop Time (ACUTE ONLY): 1055 [name] Time Calculation (min) (ACUTE ONLY): 50 min  Past Medical History:  Past Medical History:  Diagnosis Date   Allergy    Arthritis    osteoarthritis   GERD (gastroesophageal reflux disease)    Hypertension    Hypothyroidism    Thyroid disease    Hyperthyroidism s/p radioactive iodine ablation   Past Surgical History:  Past Surgical History:  Procedure Laterality Date   CARDIAC CATHETERIZATION  03/2000   HERNIA REPAIR  8937   umbilical   LEFT HEART CATH AND CORONARY ANGIOGRAPHY Left 03/20/2020   Procedure: LEFT HEART CATH AND CORONARY ANGIOGRAPHY;  Surgeon: Nelva Bush, MD;  Location: [name];  Service: Cardiovascular;  Laterality: Left;   POSTERIOR CERVICAL FUSION/FORAMINOTOMY N/A 10/06/2021   Procedure: POSTERIOR CERVICAL FUSION/ FORAMINOTOMY LEVEL 3;  Surgeon: Meade Maw, MD;  Location: [facility];  Service: Neurosurgery;  Laterality: N/A;  will need monitoring   RADIOLOGY WITH ANESTHESIA Left 04/18/2021   Procedure: MRI SHOULDER WITHOUT CONTRAST WITH ANESTHESIA;  Surgeon: Radiologist, Medication, MD;  Location: [place];  Service: Radiology;  Laterality: Left;   RADIOLOGY WITH ANESTHESIA N/A 10/03/2021   Procedure: MRI CERVICAL SPINE WITH ANESTHESIA;  Surgeon: Radiologist, Medication, MD;  Location: [place];  Service: Radiology;  Laterality: N/A;   REVERSE SHOULDER ARTHROPLASTY Left    ROTATOR CUFF REPAIR  11/2010   Dr [name] Heck   TONSILLECTOMY     TOTAL HIP ARTHROPLASTY Right 02/24/2009   TOTAL HIP ARTHROPLASTY Left 06/2016   Dr Harlow Mares   HPI:  Pt is a 66 y/o male with h/o hypothyroidism, HTN, GERD, BPH and umbilical hernia repair (1961) who is admitted with new metastatic disease to bone with epidural cord compression suspected from prostate  cancer now s/p C6 laminectomy for resection of epidural mass 8/13.  He reports that he had a left shoulder arthroplasty in March of this year.  Prior to that, he was having pain around his shoulder blade and left shoulder.  His shoulder arthroplasty did help somewhat, but he has continued to have left-sided weakness with abduction of his left shoulder and with extension of his arm.  This been ongoing for several months.  He has tingling down his left arm into his first 2 digits of his left hand.  Pt extubated yesterday post 1 day oral intubation for MRI.   CXR: There are no signs of pulmonary edema or focal pulmonary  consolidation. There is improvement in aeration of lower lung fields  suggesting resolving subsegmental atelectasis.    Assessment / Plan / Recommendation  Clinical Impression   Pt seen for BSE today. Pt was eager to have something to drink. Wife present to support him. Noted a mild, half-cough w/ Mod reduced effort to chronic phlegm -- pt and Wife endorsed this at home as well. Encouraged pt to initiate using a throat clear vs a cough as his strength continues to improve. Friend arrived during session which pt was delighted to see.   Pt was alert, verbally responsive and engaged in conversation w/ [name], Wife -- education given on Not talking around po's in order to conserve energy and lessen risk for choking on food in his mouth. This along w/ FULL UPRIGHT SITTING w/ Head Forward for any oral intake was discussed w/ pt and Wife to reduce risk for  aspiration. Pt required full support for sitting upright/positioning d/t recent surgery/shoulder discomfort.   Pt is on [place] O2 at 2-5L; wbc declining.  Pt consumed po trials given w/ No immediate, overt clinical s/s of aspiration noted w/ any consistency; respiratory status remained fairly calm and unlabored - rest breaks given b/t trials, vocal quality clear b/t trials, no cough. O2 sats remained 93-94%.  Oral phase appeared grossly [facility] for bolus  management, mastication, and timely A-P transfer for swallowing; oral clearing achieved w/ all consistencies. Rest Breaks aided in time for chewing/clearing b/t trials.   OF NOTE: pt tended to talk but reminded on not doing so while still chewing foods.  OM exam revealed fairly strong lingual/labial movements; no unilateral weakness. Min+ open-mouth breathing w/ any exertion including talking. Pt requires full feeding assistance but was able to help hold cup and bring to mouth x2.     Pt appears at risk for aspiration/aspiration pneumonia in setting of acute illness/metastatic Ca, recent surgery, respiratory fatigue and decline in Stamina. Risk for aspiration can be reduced when following general aspiration precautions.  Recommend a Regular/mech soft diet for choice of foods but ease of soft foods w/ gravies added to moisten foods; avoid Particulate foods and TALKING/Distractions while eating. Thin liquids. Recommend general aspiration precautions; Pills Whole in Puree; tray setup and UPRIGHT positioning assistance for meals. Feeding Assistance at meals d/t UE weakness.  ST services will continue to follow for po check and education next 1-2 days. NSG updated. Precautions posted at bedside. Wife and pt agreed w/ POC. MD/NSG updated. [name] Visit Diagnosis: Dysphagia, unspecified (R13.10)    Aspiration Risk  Mild aspiration risk;Risk for inadequate nutrition/hydration (hospitalization; illness)    Diet Recommendation   a Regular/mech soft diet for choice of foods but ease of soft foods w/ gravies added to moisten foods; avoid Particulate foods and TALKING/Distractions while eating. Thin liquids. Recommend general aspiration precautions; tray setup and UPRIGHT positioning assistance for meals. Feeding Assistance at meals d/t UE weakness. Rest Breaks to monitor breathing/Stamina.   Medication Administration: Whole meds with puree (for safer swallowing)    Other  Recommendations Recommended Consults:   (Dietician f/u) Oral Care Recommendations: Oral care BID;Oral care before and after PO;Staff/trained caregiver to provide oral care Other Recommendations:  (n/a)    Recommendations for follow up therapy are one component of a multi-disciplinary discharge planning process, led by the attending physician.  Recommendations may be updated based on patient status, additional functional criteria and insurance authorization.  Follow up Recommendations  (TBD)      Assistance Recommended at Discharge Frequent or constant Supervision/Assistance (feeding support)  Functional Status Assessment Patient has had a recent decline in their functional status and demonstrates the ability to make significant improvements in function in a reasonable and predictable amount of time.  Frequency and Duration min 2x/week  2 weeks       Prognosis Prognosis for Safe Diet Advancement: Fair (-Good) Barriers to Reach Goals: Time post onset;Severity of deficits Barriers/Prognosis Comment: significant illness, weakness      Swallow Study   General Date of Onset: 10/05/21 HPI: Pt is a 66 y/o male with h/o hypothyroidism, HTN, GERD, BPH and umbilical hernia repair (1961) who is admitted with new metastatic disease to bone with epidural cord compression suspected from prostate cancer now s/p C6 laminectomy for resection of epidural mass 8/13.  He reports that he had a left shoulder arthroplasty in March of this year.  Prior to that, he was having pain  around his shoulder blade and left shoulder.  His shoulder arthroplasty did help somewhat, but he has continued to have left-sided weakness with abduction of his left shoulder and with extension of his arm.  This been ongoing for several months.  He has tingling down his left arm into his first 2 digits of his left hand.  Pt extubated yesterday post 1 day oral intubation for MRI.  CXR: There are no signs of pulmonary edema or focal pulmonary  consolidation. There is improvement  in aeration of lower lung fields  suggesting resolving subsegmental atelectasis. Type of Study: Bedside Swallow Evaluation Previous Swallow Assessment: none Diet Prior to this Study: NPO Temperature Spikes Noted: No (wbc elevated) Respiratory Status: Nasal cannula (5L) History of Recent Intubation: Yes Length of Intubations (days): 1 days Date extubated: 10/08/21 Behavior/Cognition: Alert;Cooperative;Pleasant mood Oral Cavity Assessment: Within Functional Limits Oral Care Completed by [name]: Yes Oral Cavity - Dentition: Adequate natural dentition Vision: Functional for self-feeding Self-Feeding Abilities: Needs assist;Needs set up;Total assist (unable to use LUE; weak RUE) Patient Positioning: Upright in bed (needed positioning) Baseline Vocal Quality: Normal Volitional Cough: Weak (decreased effort) Volitional Swallow: Able to elicit    Oral/Motor/Sensory Function Overall Oral Motor/Sensory Function: Within functional limits   Ice Chips Ice chips: Within functional limits Presentation: Spoon (fed; 2 trials)   Thin Liquid Thin Liquid: Within functional limits Presentation: Cup;Self Fed;Straw (fully supported RUE; fed -- 10-11 trials total)    Nectar Thick Nectar Thick Liquid: Not tested   Honey Thick Honey Thick Liquid: Not tested   Puree Puree: Within functional limits Presentation: Spoon (fed; 8 trials)   Solid     Solid: Within functional limits Presentation: Spoon (fed; 8 trials)         [name], MS, CCC-[name] Speech Language Pathologist Rehab Services; [place] [phone] (ascom) [place] 10/09/2021,2:59 PM

## 2021-10-09 NOTE — Progress Notes (Addendum)
Hematology/Oncology Progress note [phone] [phone]     Patient Care Team: Default, Provider, MD as PCP - General End, Harrell Gave, MD as PCP - Cardiology (Cardiology)   Name of the patient: Jason [name]  [id number]  [birth year]  Date of visit: 10/09/21   INTERVAL HISTORY-   Patient is currently in ICU.  He developed weakness of upper and lower extremities, difficulty feeding himself on 10/07/2021, he blood pressure was soft and improved after 1 L of fluid bolus.  His blood pressure medication and IV pain medications were held.  Blood pressure continues to be unstable, patient was admitted to ICU for pressor support. Patient required mechanical intubation. Patient had additional work-up 10/08/2021, MRI thoracic spine and the lumbar spine with and without contrast 1. 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 2. 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 3. Additional epidural tumor along the posterior endplates at D5,H29, and T12 without high-grade spinal canal stenosis or cord compression. 4. Extensive epidural tumor in the lumbar spine is greatest in thickness at L1 without high-grade spinal canal stenosis at this level; however, superimposed on pre-existing degenerative changes lower in the lumbar spine results in severe spinal canal stenosis with cauda equina nerve root compression at L3-L4 and L4-L5. Multilevel neural foraminal stenosis is detailed above. 5. Postsurgical changes reflecting C6 laminectomy unchanged spinal canal stenosis compared to the postoperative study from 1 day prior.No definite cord signal abnormality. 6. Extramedullary fluid collection along the dorsal aspect of the cord beginning at T9-T10 extending into the lumbar spine is likely  subdural in location but is of uncertain etiology; evolving blood products not excluded. The collection anteriorly displaces the cord and cauda equina nerve roots without frank cord compression or signal abnormality. There is no peripheral enhancement to suggest abscess. 7. Prevertebral edema in the cervical spine which may be postsurgical in nature. 8. New bilateral lower lobe consolidations may reflect atelectasis or developing pneumonia/aspiration.  10/08/2021 MRI brain with and without contrast showed 1. 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 There is intracranial extension of tumor along the right frontal convexity measuring up to 5 mm in thickness without mass effect on the underlying brain parenchyma or midline shift. Extracranial extension of tumor in this location measures up to approximately 3 mm. 2. Additional extracranial extension of tumor into the scalp along the vertex measuring up to 7 mm in thickness. 3. Small focus of diffusion restriction in the right parietal cortex most in keeping with a small acute infarct. 4. No evidence of parenchymal metastatic disease.  I saw both patient and wife around 11:50 AM.  Patient has been extubated later 10/08/2021.Marland Kitchen  Continues to have bilateral upper extremity weakness.  pressor Levophed  to keep MAP of 80.  I ordered Firmagon '240mg'$  SubQ on 10/06/2021 and communicated with the pharmacist.  Mills Koller was offered to patient on 8/14.  Per my discussion with Mickeal Skinner from pharmacy, patient and family declined medication on 8/14 due to concern of out-of-pocket cost.    No Known Allergies  Patient Active Problem List   Diagnosis Date Noted   Hypotension and abnormal strength/sensation  10/07/2021   Elevated PSA    Abnormal MRI    Goals of care, counseling/discussion    Cervical spinal stenosis 10/05/2021   Metastatic disease ([place]) 10/05/2021  Hypertensive urgency 10/05/2021   Left shoulder  pain 03/29/2021   PSVT (paroxysmal supraventricular tachycardia) ([place]) 01/25/2021   NSVT (nonsustained ventricular tachycardia) ([address]) 01/25/2021   Hyperlipidemia 01/25/2021   Acute non-recurrent frontal sinusitis 12/27/2020   PVC's (premature ventricular contractions) 03/15/2020   Palpitations 03/14/2020   BPH (benign prostatic hyperplasia) 11/07/2013   Routine general medical examination at a health care facility 06/24/2010   Hypothyroidism 06/19/2009   OSTEOARTHRITIS 12/19/2008   BACK PAIN, LUMBAR 02/26/2007   Essential hypertension 09/17/2006   ALLERGIC RHINITIS 09/17/2006   GERD 09/17/2006     Past Medical History:  Diagnosis Date   Allergy    Arthritis    osteoarthritis   GERD (gastroesophageal reflux disease)    Hypertension    Hypothyroidism    Thyroid disease    Hyperthyroidism s/p radioactive iodine ablation     Past Surgical History:  Procedure Laterality Date   CARDIAC CATHETERIZATION  03/2000   HERNIA REPAIR  0254   umbilical   LEFT HEART CATH AND CORONARY ANGIOGRAPHY Left 03/20/2020   Procedure: LEFT HEART CATH AND CORONARY ANGIOGRAPHY;  Surgeon: Nelva Bush, MD;  Location: [facility];  Service: Cardiovascular;  Laterality: Left;   POSTERIOR CERVICAL FUSION/FORAMINOTOMY N/A 10/06/2021   Procedure: POSTERIOR CERVICAL FUSION/ FORAMINOTOMY LEVEL 3;  Surgeon: Meade Maw, MD;  Location: [facility];  Service: Neurosurgery;  Laterality: N/A;  will need monitoring   RADIOLOGY WITH ANESTHESIA Left 04/18/2021   Procedure: MRI SHOULDER WITHOUT CONTRAST WITH ANESTHESIA;  Surgeon: Radiologist, Medication, MD;  Location: [facility];  Service: Radiology;  Laterality: Left;   RADIOLOGY WITH ANESTHESIA N/A 10/03/2021   Procedure: MRI CERVICAL SPINE WITH ANESTHESIA;  Surgeon: Radiologist, Medication, MD;  Location: [facility];  Service: Radiology;  Laterality: N/A;   REVERSE SHOULDER ARTHROPLASTY Left    ROTATOR CUFF REPAIR  11/2010   Dr [name] Heck   TONSILLECTOMY      TOTAL HIP ARTHROPLASTY Right 02/24/2009   TOTAL HIP ARTHROPLASTY Left 06/2016   Dr Harlow Mares    Social History   Socioeconomic History   Marital status: Married    Spouse name: [name]    Number of children: 0   Years of education: Not on file   Highest education level: Not on file  Occupational History   Occupation: Therapist, occupational: [employer]  Tobacco Use   Smoking status: Never   Smokeless tobacco: Never  Vaping Use   Vaping Use: Never used  Substance and Sexual Activity   Alcohol use: Not Currently    Comment: Alcohol once every few weeks/months   Drug use: No   Sexual activity: Yes  Other Topics Concern   Not on file  Social History Narrative   Lives at home with spouse.    Social Determinants of Health   Financial Resource Strain: Not on file  Food Insecurity: Not on file  Transportation Needs: Not on file  Physical Activity: Not on file  Stress: Not on file  Social Connections: Not on file  Intimate Partner Violence: Not on file     Family History  Problem Relation Age of Onset   Hypertension Mother    Stroke Mother    Gout Father    Heart attack Father 54   Heart disease Sister    Colon cancer Neg Hx    Esophageal cancer Neg Hx    Rectal cancer Neg Hx    Stomach cancer Neg Hx      Current Facility-Administered Medications:    0.9 %  sodium chloride infusion, 250 mL, Intravenous, Continuous, Candee Furbish, MD   acetaminophen (OFIRMEV) IV 1,000 mg, 1,000 mg, Intravenous, Q8H PRN, Freddi Starr, MD, Stopped at 10/08/21 2210   acetaminophen (TYLENOL) tablet 650 mg, 650 mg, Oral, Q6H PRN, 650 mg at 10/07/21 0559 **OR** acetaminophen (TYLENOL) suppository 650 mg, 650 mg, Rectal, Q6H PRN, Meade Maw, MD   ascorbic acid (VITAMIN C) tablet 500 mg, 500 mg, Oral, Daily, Meade Maw, MD, 500 mg at 10/07/21 0930   Chlorhexidine Gluconate Cloth 2 % PADS 6 each, 6 each, Topical, Q0600, Candee Furbish, MD, 6 each at 10/07/21 1648    dexamethasone (DECADRON) injection 4 mg, 4 mg, Intravenous, Q6H, Candee Furbish, MD, 4 mg at 10/09/21 1627   docusate sodium (COLACE) capsule 100 mg, 100 mg, Oral, BID, Chappell, Alex B, RPH   famotidine (PEPCID) IVPB 20 mg premix, 20 mg, Intravenous, Daily, Teressa Lower, NP, Stopped at 10/09/21 1026   feeding supplement (ENSURE ENLIVE / ENSURE PLUS) liquid 237 mL, 237 mL, Oral, TID BM, Candee Furbish, MD, 237 mL at 10/09/21 1400   fentaNYL (SUBLIMAZE) injection 25 mcg, 25 mcg, Intravenous, Q3H PRN, Teressa Lower, NP   hydrALAZINE (APRESOLINE) injection 5 mg, 5 mg, Intravenous, Q6H PRN, Emeterio Reeve, DO   insulin aspart (novoLOG) injection 0-5 Units, 0-5 Units, Subcutaneous, QHS, Nelson, Dreama Saa, NP   insulin aspart (novoLOG) injection 0-9 Units, 0-9 Units, Subcutaneous, TID WC, Teressa Lower, NP, 1 Units at 10/09/21 1628   levothyroxine (SYNTHROID) tablet 88 mcg, 88 mcg, Oral, Q0600, Meade Maw, MD, 88 mcg at 10/07/21 0559   loratadine (CLARITIN) tablet 10 mg, 10 mg, Oral, BID, Meade Maw, MD, 10 mg at 10/07/21 2209   magnesium hydroxide (MILK OF MAGNESIA) suspension 30 mL, 30 mL, Oral, Daily PRN, Meade Maw, MD, 30 mL at 10/05/21 0744   meloxicam (MOBIC) tablet 15 mg, 15 mg, Oral, Daily, Meade Maw, MD, 15 mg at 10/07/21 2210   multivitamin with minerals tablet 1 tablet, 1 tablet, Oral, Daily, Meade Maw, MD, 1 tablet at 10/07/21 0930   norepinephrine (LEVOPHED) '4mg'$  in 278m (0.016 mg/mL) premix infusion, 0-40 mcg/min, Intravenous, Titrated, SCandee Furbish MD, Last Rate: 3.75 mL/hr at 10/09/21 1049, 1 mcg/min at 10/09/21 1049   ondansetron (ZOFRAN) tablet 4 mg, 4 mg, Oral, Q6H PRN **OR** ondansetron (ZOFRAN) injection 4 mg, 4 mg, Intravenous, Q6H PRN, YMeade Maw MD   oxyCODONE-acetaminophen (PERCOCET/ROXICET) 5-325 MG per tablet 1-2 tablet, 1-2 tablet, Oral, Q4H PRN, YMeade Maw MD, 2 tablet at 10/06/21 2229   polyethylene glycol  (MIRALAX / GLYCOLAX) packet 17 g, 17 g, Oral, Daily, Chappell, Alex B, RPH   potassium chloride (KLOR-CON M) CR tablet 10 mEq, 10 mEq, Oral, QHS, YMeade Maw MD, 10 mEq at 10/06/21 2227   [COMPLETED] riluzole (RILUTEK) tablet 100 mg, 100 mg, Oral, Once, 100 mg at 10/09/21 1228 **FOLLOWED BY** riluzole (RILUTEK) tablet 50 mg, 50 mg, Oral, Q12H, Thompson, Amy C, RPH   rosuvastatin (CRESTOR) tablet 5 mg, 5 mg, Oral, Daily, YMeade Maw MD, 5 mg at 10/07/21 0930   sodium chloride (OCEAN) 0.65 % nasal spray 1 spray, 1 spray, Each Nare, PRN, SCandee Furbish MD   tamsulosin ([facility] capsule 0.4 mg, 0.4 mg, Oral, Daily, YMeade Maw MD, 0.4 mg at 10/07/21 0828   Physical exam:  Vitals:   10/09/21 1200 10/09/21 1300 10/09/21 1400 10/09/21 1600  BP:  130/67    Pulse: 83 96 83 92  Resp: (Marland Kitchen  23 16 (!) 24 (!) 26  Temp: 98 F (36.7 C)   100.3 F (37.9 C)  TempSrc: Oral   Axillary  SpO2: 93% 91% 95% 95%  Weight:      Height:       Physical Exam HENT:     Head: Normocephalic.  Cardiovascular:     Rate and Rhythm: Normal rate.  Pulmonary:     Comments: Poor inspiratory efforts Abdominal:     General: There is no distension.     Palpations: Abdomen is soft.  Musculoskeletal:        General: No deformity.  Skin:    General: Skin is warm.  Neurological:     Mental Status: He is alert.     Sensory: Sensory deficit present.     Motor: Weakness present.     Comments: Decreased bilateral upper and lower extremities.       Labs     Latest Ref Rng & Units 10/09/2021    4:29 AM  CMP  Glucose 70 - 99 mg/dL 130   BUN 8 - 23 mg/dL 29   Creatinine 0.61 - 1.24 mg/dL 0.69   Sodium 135 - 145 mmol/L 136   Potassium 3.5 - 5.1 mmol/L 4.1   Chloride 98 - 111 mmol/L 108   CO2 22 - 32 mmol/L 23   Calcium 8.9 - 10.3 mg/dL 8.2   Total Protein 6.5 - 8.1 g/dL 6.0   Total Bilirubin 0.3 - 1.2 mg/dL 1.4   Alkaline Phos 38 - 126 U/L 202   AST 15 - 41 U/L 42   ALT 0 - 44 U/L 22        Latest Ref Rng & Units 10/09/2021    4:29 AM  CBC  WBC 4.0 - 10.5 K/uL 16.4   Hemoglobin 13.0 - 17.0 g/dL 10.0   Hematocrit 39.0 - 52.0 % 30.4   Platelets 150 - 400 K/uL 137     RADIOGRAPHIC STUDIES: I have personally reviewed the radiological images as listed and agreed with the findings in the report. MR BRAIN W WO CONTRAST  Result Date: 10/08/2021 CLINICAL DATA:  Extensive metastatic disease with confusion and lethargy. EXAM: MRI HEAD WITHOUT AND WITH CONTRAST TECHNIQUE: Multiplanar, multiecho pulse sequences of the brain and surrounding structures were obtained without and with intravenous contrast. CONTRAST:  54m GADAVIST GADOBUTROL 1 MMOL/ML IV SOLN COMPARISON:  None Available. FINDINGS: Brain: There is a small focus of diffusion restriction in the right parietal cortex with faint corresponding FLAIR signal abnormality but no abnormal enhancement most in keeping with a small acute infarct. There is no associated hemorrhage or mass effect. There is no other evidence of acute infarct. There is no acute intracranial hemorrhage or extra-axial fluid collection. Background parenchymal volume is normal. The ventricles are normal in size. Gray-white differentiation is preserved. Scattered small foci of FLAIR signal abnormality in the subcortical and periventricular white matter are nonspecific but likely reflects sequela of mild chronic white matter microangiopathy There is no abnormal parenchymal enhancement there is no solid parenchymal lesion. There is calvarial signal abnormality bilaterally likely reflecting osseous metastatic disease. There is intracranial enhancement along the right frontal convexity likely reflecting extraosseous tumor measuring up to 5 mm in thickness. There is additional thin pachymeningeal thickening and enhancement over the right cerebral convexity suspicious for additional tumor extension. There is extracranial extension of tumor into the soft tissues of the overlying  scalp measuring up to 3 mm in thickness. Additional extracranial tumor is seen along  the scalp at the vertex measuring up to 7 mm in thickness. There is no mass effect on the underlying brain parenchyma due to the intracranial tumor. There is no midline shift. Vascular: Normal flow voids. Skull and upper cervical spine: Osseous metastatic disease is seen in the calvarium with extraosseous extension as described above. Sinuses/Orbits: There is extensive fluid in the paranasal sinuses likely related to instrumentation. The globes and orbits are unremarkable. Other: None. IMPRESSION: 1. 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 There is intracranial extension of tumor along the right frontal convexity measuring up to 5 mm in thickness without mass effect on the underlying brain parenchyma or midline shift. Extracranial extension of tumor in this location measures up to approximately 3 mm. 2. Additional extracranial extension of tumor into the scalp along the vertex measuring up to 7 mm in thickness. 3. Small focus of diffusion restriction in the right parietal cortex most in keeping with a small acute infarct. 4. No evidence of parenchymal metastatic disease. Electronically Signed   By: Valetta Mole M.D.   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 Postop day 1 with worsening shock, confusion, lethargy. EXAM: MRI THORACIC AND LUMBAR SPINE WITHOUT AND WITH CONTRAST TECHNIQUE: Multiplanar and multiecho pulse sequences of the thoracic and lumbar spine were obtained without and with intravenous contrast. CONTRAST:  53m GADAVIST GADOBUTROL 1 MMOL/ML IV SOLN COMPARISON:  Cervical spine MRI dated 1 day prior, CT  chest/abdomen/pelvis 10/05/2021 FINDINGS: MRI THORACIC SPINE FINDINGS A single sagittal T2 sequence through the C-spine was obtained. Postsurgical changes reflecting C6 laminectomy are noted. The spinal canal patency is unchanged compared to the postoperative study from 1 day prior. There is no definite cord signal abnormality. There is prevertebral edema which may be postsurgical in nature. Alignment: There is exaggerated thoracic kyphosis. There is no antero or retrolisthesis. Vertebrae: There is extensive signal abnormality throughout the thoracic spine consistent with extensive osseous metastatic disease. There is no evidence of pathologic fracture. There is breakthrough of the posterior cortex with epidural tumor along the posterior endplates at T9, TR15 and T12 without no significant spinal canal stenosis or evidence of cord compression. Circumferential enhancement is also seen in the spinal canal at the cervicothoracic junction extending superiorly off the field of view and inferiorly to the T3-T4 level likely reflecting epidural tumor also without high-grade spinal canal stenosis or cord compression (for example 27-2, 27-6). Cord: Normal in signal and morphology. There is no abnormal cord enhancement. There is scallop shaped T1 isointense material in the dorsal canal along the cord beginning at the T9-T10 level extending inferiorly into the lumbar spine which anteriorly displaces the cord and cauda equina nerve roots, likely subdural in location of uncertain etiology. The collection measures up to 7 mm in maximal thickness in the axial plane (see image 24-33, 20-9). There is no peripheral enhancement to suggest abscess. Paraspinal and other soft tissues: There is consolidation in the lower lobes, right worse than left, new since the prior CT from 08/12. The paraspinal soft tissues are unremarkable. Disc levels: There is overall mild background degenerative change throughout the thoracic spine. There is no  significant disc herniation or spinal canal or neural foraminal stenosis. MRI LUMBAR SPINE FINDINGS Segmentation: Standard; the  lowest formed disc space is designated L5-S1. Alignment:  There is no significant antero or retrolisthesis. Vertebrae: There is extensive signal abnormality throughout the lumbar spine and imaged pelvis consistent with extensive osseous metastatic disease, most notably at L1 and L2. There is epidural extension of tumor most notably at L1 where it measures up to 5 mm in thickness and at L2 particularly in the region of the right subarticular zone and along the right lamina (7-14); however, circumferential enhancement extends inferiorly throughout the lumbar spine into the sacrum likely reflecting additional epidural tumor. The epidural tumor at L1 results in mild canal narrowing without cord/conus compression. The epidural tumor lower in the lumbar spine superimposed on pre-existing degenerative changes contribute to multilevel spinal canal stenosis as described in detail below. There is no evidence of pathologic fracture. Conus medullaris: Extends to the T12-L1 level. There is crowding of the cauda equina nerve roots due to the above-described presumed subdural fluid collection. There is no abnormal enhancement of the cauda equina nerve roots. Paraspinal and other soft tissues: The paraspinal soft tissues are unremarkable. Disc levels: T12-L1: No significant spinal canal or neural foraminal stenosis L1-L2: Disc degeneration without significant spinal canal or neural foraminal stenosis L2-L3: Disc degeneration and bilateral facet arthropathy result in mild bilateral subarticular zone narrowing without significant neural foraminal stenosis. L3-L4: There is diffuse disc bulge and moderate bilateral facet arthropathy with ligamentum flavum thickening as well as probable epidural tumor resulting in severe spinal canal stenosis with cauda equina nerve root compression and moderate left and mild  right neural foraminal stenosis L4-L5: There is diffuse disc bulge and moderate bilateral facet arthropathy as well as probable epidural tumor most notably along the dorsal canal resulting in severe spinal canal stenosis with cauda equina nerve root compression and moderate left and mild right neural foraminal stenosis L5-S1: There is a diffuse disc bulge and bilateral facet arthropathy as well as epidural tumor along the dorsal and ventral canal resulting in moderate spinal canal stenosis with bilateral subarticular zone narrowing and moderate left worse than right neural foraminal stenosis. IMPRESSION: 1. 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 2. 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 3. Additional epidural tumor along the posterior endplates at T9, P50, and T12 without high-grade spinal canal stenosis or cord compression. 4. Extensive epidural tumor in the lumbar spine is greatest in thickness at L1 without high-grade spinal canal stenosis at this level; however, superimposed on pre-existing degenerative changes lower in the lumbar spine results in severe spinal canal stenosis with cauda equina nerve root compression at L3-L4 and L4-L5. Multilevel neural foraminal stenosis is detailed above. 5. Postsurgical changes reflecting C6 laminectomy unchanged spinal canal stenosis compared to the postoperative study from 1 day prior. No definite cord signal abnormality. 6. Extramedullary fluid collection along the dorsal aspect of the cord beginning at T9-T10 extending into the lumbar spine is likely subdural in location but is of uncertain etiology; evolving blood products not excluded. The collection anteriorly displaces the cord and cauda equina nerve roots without frank cord compression or signal abnormality.  There is no peripheral enhancement to suggest abscess. 7. Prevertebral edema in the cervical spine which may be postsurgical in nature. 8. New bilateral lower lobe consolidations may reflect atelectasis or developing pneumonia/aspiration. Electronically Signed   By: Valetta Mole M.D.   On: 10/08/2021 13:18  MR Lumbar Spine W Wo Contrast  Result Date: 10/08/2021 CLINICAL DATA:  Presenting with neck and shoulder pain found to have widely metastatic cancer suspicious for prostate cancer status post emergent C6 cervical laminectomy with resection of epidural spinal tumor. Postop day 1 with worsening shock, confusion, lethargy. EXAM: MRI THORACIC AND LUMBAR SPINE WITHOUT AND WITH CONTRAST TECHNIQUE: Multiplanar and multiecho pulse sequences of the thoracic and lumbar spine were obtained without and with intravenous contrast. CONTRAST:  84m GADAVIST GADOBUTROL 1 MMOL/ML IV SOLN COMPARISON:  Cervical spine MRI dated 1 day prior, CT chest/abdomen/pelvis 10/05/2021 FINDINGS: MRI THORACIC SPINE FINDINGS A single sagittal T2 sequence through the C-spine was obtained. Postsurgical changes reflecting C6 laminectomy are noted. The spinal canal patency is unchanged compared to the postoperative study from 1 day prior. There is no definite cord signal abnormality. There is prevertebral edema which may be postsurgical in nature. Alignment: There is exaggerated thoracic kyphosis. There is no antero or retrolisthesis. Vertebrae: There is extensive signal abnormality throughout the thoracic spine consistent with extensive osseous metastatic disease. There is no evidence of pathologic fracture. There is breakthrough of the posterior cortex with epidural tumor along the posterior endplates at T9, TR48 and T12 without no significant spinal canal stenosis or evidence of cord compression. Circumferential enhancement is also seen in the spinal canal at the cervicothoracic junction extending superiorly off the field of view and  inferiorly to the T3-T4 level likely reflecting epidural tumor also without high-grade spinal canal stenosis or cord compression (for example 27-2, 27-6). Cord: Normal in signal and morphology. There is no abnormal cord enhancement. There is scallop shaped T1 isointense material in the dorsal canal along the cord beginning at the T9-T10 level extending inferiorly into the lumbar spine which anteriorly displaces the cord and cauda equina nerve roots, likely subdural in location of uncertain etiology. The collection measures up to 7 mm in maximal thickness in the axial plane (see image 24-33, 20-9). There is no peripheral enhancement to suggest abscess. Paraspinal and other soft tissues: There is consolidation in the lower lobes, right worse than left, new since the prior CT from 08/12. The paraspinal soft tissues are unremarkable. Disc levels: There is overall mild background degenerative change throughout the thoracic spine. There is no significant disc herniation or spinal canal or neural foraminal stenosis. MRI LUMBAR SPINE FINDINGS Segmentation: Standard; the lowest formed disc space is designated L5-S1. Alignment:  There is no significant antero or retrolisthesis. Vertebrae: There is extensive signal abnormality throughout the lumbar spine and imaged pelvis consistent with extensive osseous metastatic disease, most notably at L1 and L2. There is epidural extension of tumor most notably at L1 where it measures up to 5 mm in thickness and at L2 particularly in the region of the right subarticular zone and along the right lamina (7-14); however, circumferential enhancement extends inferiorly throughout the lumbar spine into the sacrum likely reflecting additional epidural tumor. The epidural tumor at L1 results in mild canal narrowing without cord/conus compression. The epidural tumor lower in the lumbar spine superimposed on pre-existing degenerative changes contribute to multilevel spinal canal stenosis as  described in detail below. There is no evidence of pathologic fracture. Conus medullaris: Extends to the T12-L1 level. There is crowding of the cauda equina nerve roots due to the above-described presumed subdural fluid collection. There is no abnormal enhancement of the cauda equina nerve roots. Paraspinal and other soft tissues: The paraspinal soft tissues are unremarkable. Disc levels: T12-L1: No significant spinal canal or neural foraminal  stenosis L1-L2: Disc degeneration without significant spinal canal or neural foraminal stenosis L2-L3: Disc degeneration and bilateral facet arthropathy result in mild bilateral subarticular zone narrowing without significant neural foraminal stenosis. L3-L4: There is diffuse disc bulge and moderate bilateral facet arthropathy with ligamentum flavum thickening as well as probable epidural tumor resulting in severe spinal canal stenosis with cauda equina nerve root compression and moderate left and mild right neural foraminal stenosis L4-L5: There is diffuse disc bulge and moderate bilateral facet arthropathy as well as probable epidural tumor most notably along the dorsal canal resulting in severe spinal canal stenosis with cauda equina nerve root compression and moderate left and mild right neural foraminal stenosis L5-S1: There is a diffuse disc bulge and bilateral facet arthropathy as well as epidural tumor along the dorsal and ventral canal resulting in moderate spinal canal stenosis with bilateral subarticular zone narrowing and moderate left worse than right neural foraminal stenosis. IMPRESSION: 1. 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 2. 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 3. Additional epidural tumor along the posterior endplates at T9, B15, and T12  without high-grade spinal canal stenosis or cord compression. 4. Extensive epidural tumor in the lumbar spine is greatest in thickness at L1 without high-grade spinal canal stenosis at this level; however, superimposed on pre-existing degenerative changes lower in the lumbar spine results in severe spinal canal stenosis with cauda equina nerve root compression at L3-L4 and L4-L5. Multilevel neural foraminal stenosis is detailed above. 5. Postsurgical changes reflecting C6 laminectomy unchanged spinal canal stenosis compared to the postoperative study from 1 day prior. No definite cord signal abnormality. 6. Extramedullary fluid collection along the dorsal aspect of the cord beginning at T9-T10 extending into the lumbar spine is likely subdural in location but is of uncertain etiology; evolving blood products not excluded. The collection anteriorly displaces the cord and cauda equina nerve roots without frank cord compression or signal abnormality. There is no peripheral enhancement to suggest abscess. 7. Prevertebral edema in the cervical spine which may be postsurgical in nature. 8. New bilateral lower lobe consolidations may reflect atelectasis or developing pneumonia/aspiration. Electronically Signed   By: Valetta Mole M.D.   On: 10/08/2021 13:18   ECHOCARDIOGRAM COMPLETE  Result Date: 10/08/2021    ECHOCARDIOGRAM REPORT   Patient Name:   DARRYON BASTIN Date of Exam: 10/08/2021 Medical Rec #:  [id number]        Height:       69.0 in Accession #:    [id number]       Weight:       201.3 lb Date of Birth:  12-Mar-1955        BSA:          2.072 m Patient Age:    38 years         BP:           116/51 mmHg Patient Gender: M                HR:           56 bpm. Exam Location:  [facility] Procedure: 2D Echo, Cardiac Doppler and Color Doppler Indications:     Other abnormalities of the heart 00.8  History:         Patient has prior history of Echocardiogram examinations, most  recent 07/19/2020. Risk  Factors:Hypertension. GERD.  Sonographer:     Sherrie Sport Referring Phys:  [id number] Emeterio Reeve Diagnosing Phys: Isaias Cowman MD  Sonographer Comments: Technically challenging study due to limited acoustic windows, echo performed with patient supine and on artificial respirator, no subcostal window and no parasternal window. IMPRESSIONS  1. Left ventricular ejection fraction, by estimation, is 55 to 60%. The left ventricle has normal function. The left ventricle has no regional wall motion abnormalities. Left ventricular diastolic parameters were normal.  2. Right ventricular systolic function is normal. The right ventricular size is normal.  3. The mitral valve is normal in structure. Mild mitral valve regurgitation. No evidence of mitral stenosis.  4. The aortic valve is normal in structure. Aortic valve regurgitation is not visualized. Mild aortic valve stenosis.  5. The inferior vena cava is normal in size with greater than 50% respiratory variability, suggesting right atrial pressure of 3 mmHg. FINDINGS  Left Ventricle: Left ventricular ejection fraction, by estimation, is 55 to 60%. The left ventricle has normal function. The left ventricle has no regional wall motion abnormalities. The left ventricular internal cavity size was normal in size. There is  no left ventricular hypertrophy. Left ventricular diastolic parameters were normal. Right Ventricle: The right ventricular size is normal. No increase in right ventricular wall thickness. Right ventricular systolic function is normal. Left Atrium: Left atrial size was normal in size. Right Atrium: Right atrial size was normal in size. Pericardium: There is no evidence of pericardial effusion. Mitral Valve: The mitral valve is normal in structure. Mild mitral valve regurgitation. No evidence of mitral valve stenosis. Tricuspid Valve: The tricuspid valve is normal in structure. Tricuspid valve regurgitation is mild . No evidence of tricuspid stenosis.  Aortic Valve: The aortic valve is normal in structure. Aortic valve regurgitation is not visualized. Mild aortic stenosis is present. Aortic valve mean gradient measures 7.0 mmHg. Aortic valve peak gradient measures 11.6 mmHg. Aortic valve area, by VTI measures 2.09 cm. Pulmonic Valve: The pulmonic valve was normal in structure. Pulmonic valve regurgitation is not visualized. No evidence of pulmonic stenosis. Aorta: The aortic root is normal in size and structure. Venous: The inferior vena cava is normal in size with greater than 50% respiratory variability, suggesting right atrial pressure of 3 mmHg. IAS/Shunts: No atrial level shunt detected by color flow Doppler.  LEFT VENTRICLE PLAX 2D LVIDd:         3.90 cm   Diastology LVIDs:         2.80 cm   LV e' medial:    6.85 cm/s LV PW:         1.00 cm   LV E/e' medial:  15.6 LV IVS:        1.40 cm   LV e' lateral:   12.70 cm/s LVOT diam:     2.10 cm   LV E/e' lateral: 8.4 LV SV:         65 LV SV Index:   31 LVOT Area:     3.46 cm  RIGHT VENTRICLE RV Basal diam:  3.70 cm RV S prime:     16.80 cm/s TAPSE (M-mode): 2.1 cm LEFT ATRIUM             Index        RIGHT ATRIUM           Index LA diam:        4.50 cm 2.17 cm/m   RA Area:     19.50  cm LA Vol (A2C):   62.1 ml 29.98 ml/m  RA Volume:   51.40 ml  24.81 ml/m LA Vol (A4C):   73.2 ml 35.33 ml/m LA Biplane Vol: 70.9 ml 34.22 ml/m  AORTIC VALVE AV Area (Vmax):    1.75 cm AV Area (Vmean):   1.87 cm AV Area (VTI):     2.09 cm AV Vmax:           170.33 cm/s AV Vmean:          115.967 cm/s AV VTI:            0.312 m AV Peak Grad:      11.6 mmHg AV Mean Grad:      7.0 mmHg LVOT Vmax:         86.20 cm/s LVOT Vmean:        62.500 cm/s LVOT VTI:          0.188 m LVOT/AV VTI ratio: 0.60 MITRAL VALVE                TRICUSPID VALVE MV Area (PHT): 3.15 cm     TR Peak grad:   12.4 mmHg MV Decel Time: 241 msec     TR Vmax:        176.00 cm/s MV E velocity: 107.00 cm/s MV A velocity: 102.00 cm/s  SHUNTS MV E/A ratio:  1.05          Systemic VTI:  0.19 m                             Systemic Diam: 2.10 cm Isaias Cowman MD Electronically signed by Isaias Cowman MD Signature Date/Time: 10/08/2021/1:07:52 PM    Final    DG Chest Port 1 View  Result Date: 10/08/2021 CLINICAL DATA:  Difficulty breathing EXAM: PORTABLE CHEST 1 VIEW COMPARISON:  Previous studies including the examination of 10/07/2021 FINDINGS: Cardiac size is within normal limits. Thoracic aorta is tortuous and ectatic. Tip of endotracheal tube is 4.5 cm above the carina. Enteric tube is noted traversing the esophagus with distal portion of the tube in the stomach. Tip of left IJ central venous catheter is seen in right atrium. There are no signs of pulmonary edema. There is poor inspiration. No new focal pulmonary consolidation is seen. There is improvement in aeration of lower lung fields. There is no significant pleural effusion or pneumothorax. Left shoulder arthroplasty is noted. IMPRESSION: There are no signs of pulmonary edema or focal pulmonary consolidation. There is improvement in aeration of lower lung fields suggesting resolving subsegmental atelectasis. Electronically Signed   By: Elmer Picker M.D.   On: 10/08/2021 08:28   DG Abd 1 View  Result Date: 10/07/2021 CLINICAL DATA:  Nasogastric tube placement. EXAM: ABDOMEN - 1 VIEW COMPARISON:  October 07, 2021 (6:31 p.m.) FINDINGS: A nasogastric tube is seen with its distal tip seen within the body of the stomach. Properly positioned endotracheal tube and left internal jugular venous catheter are also seen. The bowel gas pattern within the upper abdomen is normal. The remainder of the abdomen and pelvis are not included in the field of view and are limited in evaluation. IMPRESSION: Nasogastric tube positioning, as described above. Electronically Signed   By: Virgina Norfolk M.D.   On: 10/07/2021 21:45   MR CERVICAL SPINE WO CONTRAST  Result Date: 10/07/2021 CLINICAL DATA:  Unable to move  legs. Posterior cervical decompression yesterday for metastatic disease. EXAM: MRI CERVICAL SPINE  WITHOUT CONTRAST TECHNIQUE: Multiplanar, multisequence MR imaging of the cervical spine was performed. No intravenous contrast was administered. COMPARISON:  CT cervical spine dated October 05, 2021. MRI cervical spine dated October 03, 2021. FINDINGS: Alignment: New straightening of the normal cervical lordosis. No significant listhesis. Vertebrae: Similar abnormal marrow signal involving the C4 posterior elements and C5 through T2 vertebral bodies and posterior elements. Cord: No abnormal signal. Dorsal epidural tumor at C4 is unchanged. Circumferential epidural tumor from C5-C7 appears similar. No definite superimposed epidural fluid collection. Posterior Fossa, vertebral arteries, paraspinal tissues: Postsurgical changes in the paraspinous soft tissues. No fluid collection. Mild prevertebral soft tissue swelling. Disc levels: C2-C3: Negative disc. Unchanged moderate left and mild right facet arthropathy. No stenosis. C3-C4: Unchanged small posterior disc osteophyte complex, mild bilateral uncovertebral hypertrophy, and moderate left and mild right facet arthropathy. Unchanged mild spinal canal stenosis. Unchanged moderate left neuroforaminal stenosis. No right neuroforaminal stenosis. C4-C5: Unchanged mild disc bulging and moderate right facet uncovertebral hypertrophy. Similar dorsal epidural tumor. New moderate spinal canal stenosis. Unchanged mild bilateral neuroforaminal stenosis. C5-C6: Interval bilateral C6 hemilaminectomies. Unchanged bulky posterior disc osteophyte complex and severe bilateral uncovertebral hypertrophy. Similar circumferential epidural tumor extending into both neural foramina. Progressive severe spinal canal stenosis. Unchanged biforaminal effacement. C6-C7: Unchanged small posterior disc osteophyte complex and severe bilateral uncovertebral hypertrophy. Similar circumferential epidural tumor  extending into both neural foramina. Progressive severe spinal canal stenosis. Unchanged biforaminal effacement. C7-T1: Negative disc. Unchanged partial effacement of the left neural foramen due to tumor. No spinal canal stenosis. IMPRESSION: 1. Interval bilateral C6 hemilaminectomies. Similar degree of epidural tumor from C4-C7 without definite superimposed epidural hematoma or fluid collection. 2. Progressive spinal canal stenosis from C4-C7, severe at C5-C6 and C6-C7. No abnormal spinal cord signal or cord infarct. 3. Similar diffuse cervical osseous metastatic disease. These results were called by telephone at the time of interpretation on 10/07/2021 at 8:05 pm to provider [facility] , who verbally acknowledged these results. Electronically Signed   By: Titus Dubin M.D.   On: 10/07/2021 20:48   DG Abd 1 View  Result Date: 10/07/2021 CLINICAL DATA:  Orogastric tube placement. EXAM: ABDOMEN - 1 VIEW COMPARISON:  None Available. FINDINGS: An enteric tube is seen with its distal tip approximately 3.6 cm distal to the expected region of the gastroesophageal junction. The bowel gas pattern is normal. A large stool burden is seen within the ascending and proximal transverse colon. No radio-opaque calculi or other significant radiographic abnormality are seen. IMPRESSION: 1. Enteric tube positioning, as described above. Further advancement of the NG tube by proximally 7 cm is recommended to decrease the risk of aspiration. 2. Large stool burden without evidence of bowel obstruction. Electronically Signed   By: Virgina Norfolk M.D.   On: 10/07/2021 18:45   DG Abd 1 View  Result Date: 10/07/2021 CLINICAL DATA:  Orogastric tube placement. EXAM: ABDOMEN - 1 VIEW COMPARISON:  October 07, 2021 FINDINGS: An enteric tube is seen with its distal tip approximately 3.4 cm distal to the expected region of the gastroesophageal junction. This is unchanged in position when compared to the prior study. The bowel gas  pattern is normal. A large stool burden is seen within the ascending and proximal transverse colon. No radio-opaque calculi or other significant radiographic abnormality are seen. IMPRESSION: 1. Enteric tube positioning, as described above. Further advancement of the NG tube by approximately 6 cm is recommended to decrease the risk of aspiration. 2. Large stool burden within the ascending and  proximal transverse colon, without evidence of bowel obstruction. Electronically Signed   By: Virgina Norfolk M.D.   On: 10/07/2021 18:43   DG Chest Port 1 View  Result Date: 10/07/2021 CLINICAL DATA:  Hypertension, postoperative day 1 for cervical spine surgery. EXAM: PORTABLE CHEST 1 VIEW COMPARISON:  10/07/2021 4:51 p.m. FINDINGS: An endotracheal tube is present with tip 3.6 cm above the carina. Left central line tip: Lower SVC. A drain is coiled over the right upper chest and lower neck. Reverse left shoulder arthroplasty. Low lung volumes are present, causing crowding of the pulmonary vasculature. Mild bandlike atelectasis in the perihilar regions and bases. No appreciable pneumothorax. There is some vague osseous densities likely reflecting the patient's known osseous metastatic disease. IMPRESSION: 1. Endotracheal tube placed with tip 3.6 cm above the carina. Left central line tip: Lower SVC. No pneumothorax. 2. Low lung volumes are present with scattered perihilar and basilar atelectasis. 3. Hazy osseous densities likely reflecting the patient's known osseous metastatic disease. Electronically Signed   By: Van Clines M.D.   On: 10/07/2021 18:35   DG Chest Port 1 View  Result Date: 10/07/2021 CLINICAL DATA:  Per MD note, sepsis.  [id number] EXAM: PORTABLE CHEST 1 VIEW COMPARISON:  Chest x-ray 03/11/2020, CT chest 10/05/2021 FINDINGS: Surgical drain and skin staples overly the neck. The heart and mediastinal contours are unchanged. Slightly low lung volumes. Curvilinear densities overlying the right lower  lung zone that appear to extend along the anterior ribs likely related to known metastatic osseous disease. No definite focal consolidation. No pulmonary edema. No pleural effusion. No pneumothorax. No acute osseous abnormality. IMPRESSION: 1. Slightly low lung volumes with no acute cardiopulmonary abnormality. 2. Curvilinear densities overlying the right lower lung zone that appear to extend along the anterior ribs likely related to known metastatic osseous disease. Electronically Signed   By: Iven Finn M.D.   On: 10/07/2021 17:09   DG Cervical Spine 2-3 Views  Result Date: 10/06/2021 CLINICAL DATA:  Cervical surgery EXAM: CERVICAL SPINE - 2-3 VIEW COMPARISON:  CT 10/05/2021 FINDINGS: A single C-arm fluoroscopic image was obtained intraoperatively and submitted for post operative interpretation. Lateral image of the cervical spine with tissue spreaders present posteriorly. 2 seconds fluoroscopy time utilized. Radiation dose: 0.9 mGy. Please see the performing provider's procedural report for further detail. IMPRESSION: Intraoperative fluoroscopic image during cervical spine surgery. Electronically Signed   By: Davina Poke D.O.   On: 10/06/2021 09:28   DG C-Arm 1-60 Min-No Report  Result Date: 10/06/2021 Fluoroscopy was utilized by the requesting physician.  No radiographic interpretation.   CT CERVICAL SPINE WO CONTRAST  Result Date: 10/05/2021 CLINICAL DATA:  Metastatic disease of the cervical spine. Unknown primary. EXAM: CT CERVICAL SPINE WITHOUT CONTRAST TECHNIQUE: Multidetector CT imaging of the cervical spine was performed without intravenous contrast. Multiplanar CT image reconstructions were also generated. RADIATION DOSE REDUCTION: This exam was performed according to the departmental dose-optimization program which includes automated exposure control, adjustment of the mA and/or kV according to patient size and/or use of iterative reconstruction technique. COMPARISON:  MRI of the  cervical spine without contrast 10/03/2021 FINDINGS: Alignment: Slight degenerative anterolisthesis again noted at C3-4. Alignment is otherwise anatomic. Reversal of the normal cervical lordosis is noted. Skull base and vertebrae: Craniocervical junction is within normal limits. Extensive heterogeneous sclerotic lesions are present in the cervical spine. Left-sided sclerotic lesions present at C5 and inferiorly at C4 corresponding 2 marrow signal changes on the MRI scan. Diffuse sclerotic changes present at C6  and C7. Mixed sclerotic and lytic changes are present in the posterior elements at C5 and C6. No pathologic fractures are present. Heterogeneous sclerotic changes are present within the spinous process at T1 and T2. Soft tissues and spinal canal: The extraosseous tumor at C5 and C6 is better appreciated on MRI. Prevertebral soft tissues are within normal limits. Disc levels: Central and foraminal narrowing is greatest at C5-6 and C6-7 is described on the MRI scan. Upper chest: Lung apices are clear. IMPRESSION: 1. Extensive heterogeneous sclerotic lesions throughout the cervical spine compatible with metastatic disease to the bone. 2. Mixed sclerotic and lytic changes in the posterior elements at C5 and C6. 3. Extraosseous tumor at C5 and C6 is better appreciated on the MRI scan. 4. No pathologic fractures. 5. Central and foraminal narrowing is greatest at C5-6 and C6-7 is described on the MRI scan. Electronically Signed   By: San Morelle M.D.   On: 10/05/2021 12:54   CT CHEST ABDOMEN PELVIS W CONTRAST  Result Date: 10/05/2021 CLINICAL DATA:  Metastatic disease evaluation. EXAM: CT CHEST, ABDOMEN, AND PELVIS WITH CONTRAST TECHNIQUE: Multidetector CT imaging of the chest, abdomen and pelvis was performed following the standard protocol during bolus administration of intravenous contrast. RADIATION DOSE REDUCTION: This exam was performed according to the departmental dose-optimization program which  includes automated exposure control, adjustment of the mA and/or kV according to patient size and/or use of iterative reconstruction technique. CONTRAST:  186m OMNIPAQUE IOHEXOL 300 MG/ML  SOLN COMPARISON:  None Available. FINDINGS: CT CHEST FINDINGS Cardiovascular: No significant vascular findings. Normal heart size. No pericardial effusion. Mediastinum/Nodes: No enlarged mediastinal, hilar, or axillary lymph nodes. Thyroid gland, trachea, and esophagus demonstrate no significant findings. Lungs/Pleura: Mild lingular and mild right middle lobe linear atelectasis is seen. There is no evidence of acute infiltrate, pleural effusion or pneumothorax. Musculoskeletal: A left shoulder replacement is seen with associated streak artifact. Hazy, sclerotic appearing areas are seen involving the anterolateral aspects of the second and fourth right ribs. Multilevel degenerative changes seen throughout the thoracic spine. CT ABDOMEN PELVIS FINDINGS Hepatobiliary: No focal liver abnormality is seen. No gallstones, gallbladder wall thickening, or biliary dilatation. Pancreas: Unremarkable. No pancreatic ductal dilatation or surrounding inflammatory changes. Spleen: Punctate calcified granulomas are seen within the posterior aspect of an otherwise normal-appearing spleen. Adrenals/Urinary Tract: Adrenal glands are unremarkable. Kidneys are normal, without renal calculi, focal lesion, or hydronephrosis. The urinary bladder is limited in evaluation secondary to the presence of overlying streak artifact. Stomach/Bowel: Stomach is within normal limits. The appendix is not identified. Stool is seen throughout the large bowel. No evidence of bowel wall thickening, distention, or inflammatory changes. Noninflamed diverticula are seen throughout the sigmoid colon. Vascular/Lymphatic: Aortic atherosclerosis. Moderate severity Para-aortic and aortocaval lymphadenopathy is seen. This extends into the pelvis along the bilateral iliac chains,  right greater than left Reproductive: The prostate gland is markedly limited in evaluation secondary to the presence of overlying streak artifact. Other: No abdominal wall hernia or abnormality. No abdominopelvic ascites. 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 There is an extensive amount of associated streak artifact with subsequently limited evaluation of the adjacent osseous and soft tissue structures. Small lytic areas are seen at the levels of L4 and S1, with diffusely sclerotic changes seen throughout the remainder of the sacrum and left iliac bone. A mixed cystic and sclerotic lesion is seen within the right iliac bone. Multilevel degenerative changes are seen throughout the lumbar spine. IMPRESSION: 1. Moderate severity retroperitoneal and pelvic lymphadenopathy,  consistent with metastatic disease. 2. Small lytic areas at the levels of L4 and S1, with diffusely sclerotic changes involving the second and fourth right ribs, sacrum and left iliac bone. 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 Further evaluation with a whole body nuclear medicine bone scan is recommended. 3. 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 4. Sigmoid diverticulosis. 5. Aortic atherosclerosis. Aortic Atherosclerosis (ICD10-I70.0). Electronically Signed   By: Virgina Norfolk M.D.   On: 10/05/2021 01:56   MR CERVICAL SPINE WO CONTRAST  Result Date: 10/03/2021 CLINICAL DATA:  Neck and left shoulder pain EXAM: MRI CERVICAL SPINE WITHOUT CONTRAST TECHNIQUE: Multiplanar, multisequence MR imaging of the cervical spine was performed. No intravenous contrast was administered. COMPARISON:  None Available. FINDINGS: Alignment: Reversal of cervical lordosis. Mild anterolisthesis at C3-C4. Vertebrae: Multifocal abnormal marrow signal particularly involving C4-C7 and included upper thoracic vertebral bodies. This includes the posterior elements. Dorsal  epidural soft tissue is present at C4. Circumferential involvement is present at C5-C7. There is marked canal stenosis dorsal to C6 without cord compression. Cord: No abnormal signal. Posterior Fossa, vertebral arteries, paraspinal tissues: Partially imaged paranasal sinus opacification. Disc levels: C2-C3:  Left facet hypertrophy.  No canal or foraminal stenosis. C3-C4: Disc bulge with endplate osteophytes. Left facet hypertrophy. Effacement of the ventral subarachnoid space without significant canal narrowing. No foraminal stenosis. C4-C5: Disc bulge with superimposed small right foraminal protrusion and endplate osteophytes. Left greater than right facet hypertrophy. Dorsal epidural soft tissue. No canal stenosis. No left foraminal stenosis. Probable mild right foraminal stenosis secondary to extraosseous disease. C5-C6: Disc bulge with endplate osteophytes. Facet and uncovertebral hypertrophy. Circumferential epidural soft tissue. Moderate to marked canal stenosis at disc level. Effacement of left greater than right foramina due to extraosseous disease. C6-C7: Disc bulge with endplate osteophytes. Uncovertebral and facet hypertrophy. Moderate canal stenosis at disc level. Effacement of left greater than right foramina due to extraosseous disease. C7-T1: No canal or right foraminal stenosis. Partial effacement left foramen due to extraosseous disease. IMPRESSION: Multifocal abnormal marrow signal with relatively diffuse involvement of C4 into the thoracic spine. Associated epidural disease, greatest at C6 where there is marked canal stenosis without cord compression. Left greater than right foraminal effacement due to extraosseous disease. May reflect metastatic disease or marrow infiltrative neoplastic process. These results will be called to the ordering clinician or representative by the Radiologist Assistant, and communication documented in the PACS or [employer]. Electronically Signed   By: Macy Mis M.D.   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  Status post C6 laminectomy for resection of C6 epidural mass PSA was obtained and level was >1500 Surgical pathology showed metastatic adenocarcinoma, compatible with prostatic primary. Imaging results and pathology results were reviewed with the patient and wife.  They were made aware that patient has extensive metastatic prostate cancer, extremely high tumor burden and a poor prognosis. I recommend patient to reconsider continued deprivation therapy. Wife said that Mills Koller was offered to him when patient had neurological decline and she was under the impression that Dr. Izora Ribas would like to patient's neurological condition stabilized first before proceeding with [facility].  Also wife said that patient's RN mentioned that this medication is very costly which also concerned her about out-of-pocket responsibility.   I had a lengthy discussion with both patient and wife.  Rationale and potential  side effects were reviewed with him on 10/07/2022 and we discussed again today.  Patient and wife agree with proceeding with Mills Koller treatments. I do not have details on his insurance coverage and his out-of-pocket responsibility for this medication because insurance coverage varies.  It is my opinion that he may benefit from [place] to be administrated inpatiently due to his extensive disease and declining of neurological functions secondary to metastatic epidural disease.  Patient and wife agree with the plan and would not want to delay treatment further.   I communicated with pharmacy and patient will receive Firmagon 240 mg subcutaneously. Currently patient is not stable enough for aggressive IV chemotherapy and oral antigen receptor inhibitors are not available inpatient. I recommend to  consult radiation oncology Dr. Baruch Gouty for evaluation of need of radiation inpatient.per Neurosurgery, C spine can not be radiated until labor day. I appreciate RadOnc opinion on his thoracic and lumbar spine epidural diseases..  -I sent a secure chat message to Dr. Baruch Gouty and his RN Ranelle Oyster.  #MRI brain showed small focus of diffusion restriction in the right parietal cortex, possible acute infarct.  This could be secondary to the hypercoagulable state due to cancer.  Recommend neurology consultation.  4PM patient's RN contacted me that patient's wife has additional questions and would like to further discuss with me. I called patient's wife at 5 PM  Per wife Mills Koller is not a preferred medication for his insurance.  Eligard is a preferred.  She asks if Eligard can be used in the future.  I explained the different mechanism of action of Firmagon versus Eligard.  Eligard is agonist of LHRH which can potentially cause flareup of testosterone level and cancer symptoms.  Mills Koller, is GnRH receptor antagonist, is not associated with flare symptoms.  Given that his insurance does not cover [place] well, we will switch to Eligard for future dosing.  He is due for androgen deprivation therapy in 4 weeks. Wife appreciates the explanation.  I discussed above plan with Dr. Glean Salvo and the patient's hospitalist attending Dr. Ina Homes via secure chat.  Dr. Cari Caraway agrees with proceeding with ADT- wife made aware.  Thank you for allowing me to participate in the care of this patient.  Dr. Cecille Aver and Beckey Rutter will be covering from 8/17-8/20.   Earlie Server, MD, PhD Hematology Oncology 10/09/2021

## 2021-10-09 NOTE — Progress Notes (Signed)
   NAME:  Jason [name], MRN:  [id number], DOB:  November 13, 1955, LOS: 4 ADMISSION DATE:  10/04/2021, CONSULTATION DATE:  10/07/21 REFERRING MD:  Sheppard Coil, CHIEF COMPLAINT:  weakness   History of Present Illness:  66 year old man who presented with neck/shoulder pain found to have widely metastatic cancer suspicious for prostate s/p emergent C6 cervical laminectomy with resection of epidural spinal tumor.  He is POD 1 and having some worsening shock despite fluids.  Becoming more confused, lethargic so PCCM consulted.  Pertinent  Medical History  HTN Hypothyroidism Allergy GERD  Arthritis   Significant Hospital Events: Including procedures, antibiotic start and stop dates in addition to other pertinent events   8/12 admitted with chronic neck pain and worsening L hand paresthesias, L arm weakness 8/14  Pt transferred to ICU with hypotension along with significant weakness in upper and lower extremities.  Required mechanical intubation for MRI Cervical Spine due to severe claustrophobia  8/14: MR Cervical Spine revealed interval bilateral C6        hemilaminectomies. Similar degree of epidural tumor from          C4-C7 without definite superimposed epidural hematoma or        fluid collection. Progressive spinal canal stenosis from C4-C7,        severe at C5-C6 and C6-C7. No abnormal spinal cord signal or        cord infarct. Similar diffuse cervical osseous metastatic disease. 8/15: Pt remains mechanically intubated.  Orders placed for MR Brain, Lumbar Spine, and Thoracic Spine to further assess etiology of acute BLE weakness/numbness.  Extubated later in day.   Interim History / Subjective:  In poor spirits. Remains on low dose levophed. Neuro exam as below  Objective   Blood pressure 118/66, pulse 71, temperature 99.7 F (37.6 C), temperature source Axillary, resp. rate 18, height '5\' 9"'$  (1.753 m), weight 91.3 kg, SpO2 97 %.    Vent Mode: PRVC FiO2 (%):  [40 %] 40 % Set Rate:  [18  bmp] 18 bmp Vt Set:  [560 mL] 560 mL PEEP:  [5 cmH20] 5 cmH20 Plateau Pressure:  [16 cmH20] 16 cmH20   Intake/Output Summary (Last 24 hours) at 10/09/2021 0728 Last data filed at 10/09/2021 0630 Gross per 24 hour  Intake 474.13 ml  Output 1035 ml  Net -560.87 ml    Filed Weights   10/04/21 1656 10/07/21 1645  Weight: 85 kg 91.3 kg    Examination: No distress Poor inspiratory effort Ext warm Proximal upper arm weakness and complete paresis of bilateral lower ext stable Diminished sensorium in lowers but still present, also stable Foley/central line in place  Labs stable: mild anemia, leukocytosis, thrombocytopenia.  BMP benign.  Resolved Hospital Problem list   N/A  Assessment & Plan:  Spinal shock- related to extensive metastatic disease, improved Likely metastatic prostate cancer to epidural space, spine, calvarium, pelvis- with significant neurological deficits, s/p cervical tumor debulking and canal decompression 10/06/21.  Final path pending. - Await path then onc to weigh in regarding treatment options, prognosis.  Patient seems wavering on whether to even pursue treatment at present. - Continue steroids and MAP push - Postop incision care per NSGY - Levophed for MAP 80 - PT/OT consults  32 min cc time Erskine Emery MD PCCM

## 2021-10-10 ENCOUNTER — Ambulatory Visit: Payer: 59 | Admitting: Radiation Oncology

## 2021-10-10 ENCOUNTER — Inpatient Hospital Stay: Payer: BC Managed Care – PPO

## 2021-10-10 DIAGNOSIS — C61 Malignant neoplasm of prostate: Secondary | ICD-10-CM | POA: Diagnosis not present

## 2021-10-10 DIAGNOSIS — C799 Secondary malignant neoplasm of unspecified site: Secondary | ICD-10-CM

## 2021-10-10 DIAGNOSIS — M4802 Spinal stenosis, cervical region: Secondary | ICD-10-CM | POA: Diagnosis not present

## 2021-10-10 DIAGNOSIS — R9389 Abnormal findings on diagnostic imaging of other specified body structures: Secondary | ICD-10-CM | POA: Diagnosis not present

## 2021-10-10 DIAGNOSIS — I639 Cerebral infarction, unspecified: Secondary | ICD-10-CM

## 2021-10-10 LAB — COMPREHENSIVE METABOLIC PANEL
ALT: 51 U/L — ABNORMAL HIGH (ref 0–44)
AST: 69 U/L — ABNORMAL HIGH (ref 15–41)
Albumin: 2.8 g/dL — ABNORMAL LOW (ref 3.5–5.0)
Alkaline Phosphatase: 171 U/L — ABNORMAL HIGH (ref 38–126)
Anion gap: 5 (ref 5–15)
BUN: 46 mg/dL — ABNORMAL HIGH (ref 8–23)
CO2: 24 mmol/L (ref 22–32)
Calcium: 8.2 mg/dL — ABNORMAL LOW (ref 8.9–10.3)
Chloride: 107 mmol/L (ref 98–111)
Creatinine, Ser: 0.82 mg/dL (ref 0.61–1.24)
GFR, Estimated: >60 mL/min (ref >60)
Glucose, Bld: 143 mg/dL — ABNORMAL HIGH (ref 70–99)
Potassium: 3.8 mmol/L (ref 3.5–5.1)
Sodium: 136 mmol/L (ref 135–145)
Total Bilirubin: 1.2 mg/dL (ref 0.3–1.2)
Total Protein: 5.8 g/dL — ABNORMAL LOW (ref 6.5–8.1)

## 2021-10-10 LAB — CBC
HCT: 26.5 % — ABNORMAL LOW (ref 39.0–52.0)
Hemoglobin: 8.8 g/dL — ABNORMAL LOW (ref 13.0–17.0)
MCH: 30.9 pg (ref 26.0–34.0)
MCHC: 33.2 g/dL (ref 30.0–36.0)
MCV: 93 fL (ref 80.0–100.0)
Platelets: 175 10*3/uL (ref 150–400)
RBC: 2.85 MIL/uL — ABNORMAL LOW (ref 4.22–5.81)
RDW: 14 % (ref 11.5–15.5)
WBC: 15.3 10*3/uL — ABNORMAL HIGH (ref 4.0–10.5)
nRBC: 0 % (ref 0.0–0.2)

## 2021-10-10 LAB — GLUCOSE, CAPILLARY
Glucose-Capillary: 115 mg/dL — ABNORMAL HIGH (ref 70–99)
Glucose-Capillary: 148 mg/dL — ABNORMAL HIGH (ref 70–99)
Glucose-Capillary: 174 mg/dL — ABNORMAL HIGH (ref 70–99)

## 2021-10-10 LAB — MAGNESIUM: Magnesium: 2.6 mg/dL — ABNORMAL HIGH (ref 1.7–2.4)

## 2021-10-10 LAB — PHOSPHORUS: Phosphorus: 3 mg/dL (ref 2.5–4.6)

## 2021-10-10 MED ORDER — CLONAZEPAM 0.5 MG PO TABS
0.5000 mg | ORAL_TABLET | Freq: Two times a day (BID) | ORAL | Status: DC
  Administered 2021-10-10 – 2021-10-17 (×15): 0.5 mg via ORAL
  Filled 2021-10-10 (×15): qty 1

## 2021-10-10 MED ORDER — MIDODRINE HCL 5 MG PO TABS
5.0000 mg | ORAL_TABLET | Freq: Three times a day (TID) | ORAL | Status: DC
  Administered 2021-10-10 (×2): 5 mg via ORAL
  Filled 2021-10-10 (×2): qty 1

## 2021-10-10 MED ORDER — ENOXAPARIN SODIUM 40 MG/0.4ML IJ SOSY
40.0000 mg | PREFILLED_SYRINGE | INTRAMUSCULAR | Status: DC
  Administered 2021-10-10 – 2021-10-14 (×5): 40 mg via SUBCUTANEOUS
  Filled 2021-10-10 (×5): qty 0.4

## 2021-10-10 MED ORDER — FAMOTIDINE 20 MG PO TABS
20.0000 mg | ORAL_TABLET | Freq: Every day | ORAL | Status: DC
  Administered 2021-10-11 – 2021-10-17 (×7): 20 mg via ORAL
  Filled 2021-10-10 (×7): qty 1

## 2021-10-10 MED ORDER — FLUOXETINE HCL 20 MG PO CAPS
20.0000 mg | ORAL_CAPSULE | Freq: Every day | ORAL | Status: DC
  Administered 2021-10-10 – 2021-10-16 (×7): 20 mg via ORAL
  Filled 2021-10-10 (×7): qty 1

## 2021-10-10 MED ORDER — IPRATROPIUM-ALBUTEROL 0.5-2.5 (3) MG/3ML IN SOLN
3.0000 mL | Freq: Four times a day (QID) | RESPIRATORY_TRACT | Status: DC | PRN
  Administered 2021-10-10 – 2021-10-11 (×2): 3 mL via RESPIRATORY_TRACT
  Filled 2021-10-10 (×2): qty 3

## 2021-10-10 MED ORDER — ASPIRIN 81 MG PO CHEW
81.0000 mg | CHEWABLE_TABLET | Freq: Once | ORAL | Status: AC
  Administered 2021-10-10: 81 mg via ORAL
  Filled 2021-10-10: qty 1

## 2021-10-10 MED ORDER — ASPIRIN 81 MG PO CHEW: 81.0000 mg | CHEWABLE_TABLET | Freq: Once | ORAL | Status: DC

## 2021-10-10 MED ORDER — IOHEXOL 350 MG/ML SOLN
75.0000 mL | Freq: Once | INTRAVENOUS | Status: AC | PRN
  Administered 2021-10-10: 75 mL via INTRAVENOUS

## 2021-10-10 MED ORDER — OXYCODONE-ACETAMINOPHEN 5-325 MG PO TABS: 1.0000 | ORAL_TABLET | Freq: Four times a day (QID) | ORAL | Status: DC | PRN

## 2021-10-10 NOTE — Consult Note (Signed)
NEW PATIENT EVALUATION  Name: Jason [name]  MRN: [id number]  Date:   10/04/2021     DOB: [birth year]   This 65 y.o. male patient seen in the ICU for evaluation of widespread metastatic prostate cancer status post instrumentation of the cervical spine for cord compression REFERRING PHYSICIAN: No ref. provider found  CHIEF COMPLAINT:  Chief Complaint  Patient presents with   Neck Pain    DIAGNOSIS: The primary encounter diagnosis was Abnormal MRI. Diagnoses of Metastatic malignant neoplasm, unspecified site [facility]), Malignant neoplasm metastatic to bone [facility]), and Prostate cancer metastatic to multiple sites [facility]) were also pertinent to this visit.   PREVIOUS INVESTIGATIONS:  MRI scans of cervical lumbar and thoracic spine reviewed PSMA's PET scan being ordered Pathology report reviewed Clinical notes reviewed  HPI: Patient is a 66 year old male who presented the ER with progressive neck and shoulder pain and some paresthesias of his left hand.  MRI scan showed multifocal marrow signal intensity with involvement of the C4-C7 vertebral body consistent with metastatic disease without cord compression.  There was marked canal stenosis at C6 circumferential involvement is present.  CT scan of chest abdomen pelvis showed retroperitoneal and pelvic lymphadenopathy consistent with metastatic disease.  Patient underwent C6 hemilaminectomies by Dr. Cari Caraway showing similar degree of epidural tumor from C4 7 with pathology consistent with adenocarcinoma consistent with prostate origin.  His PSA is in the thousands.  He has been started on Firmagon.  MRI of the thoracic and lumbar spines also showed diffuse involvement.  Outpatient PSMA PET scan has been ordered.  Patient also had an MRI of the brain showing calvarial metastatic disease with slight encroachment on the underlying brain parenchyma.  He is seen today for consideration of palliative treatment.  He is doing well does have some decrease  strength in his left lower extremity no significant pain at this time.  Patient was seen in the ICU. PLANNED TREATMENT REGIMEN: Palliative radiation therapy to cervical spine  PAST MEDICAL HISTORY:  has a past medical history of Allergy, Arthritis, GERD (gastroesophageal reflux disease), Hypertension, Hypothyroidism, and Thyroid disease.    PAST SURGICAL HISTORY:  Past Surgical History:  Procedure Laterality Date   CARDIAC CATHETERIZATION  03/2000   HERNIA REPAIR  4098   umbilical   LEFT HEART CATH AND CORONARY ANGIOGRAPHY Left 03/20/2020   Procedure: LEFT HEART CATH AND CORONARY ANGIOGRAPHY;  Surgeon: Nelva Bush, MD;  Location: [place] CV LAB;  Service: Cardiovascular;  Laterality: Left;   POSTERIOR CERVICAL FUSION/FORAMINOTOMY N/A 10/06/2021   Procedure: POSTERIOR CERVICAL FUSION/ FORAMINOTOMY LEVEL 3;  Surgeon: Meade Maw, MD;  Location: [facility] ORS;  Service: Neurosurgery;  Laterality: N/A;  will need monitoring   RADIOLOGY WITH ANESTHESIA Left 04/18/2021   Procedure: MRI SHOULDER WITHOUT CONTRAST WITH ANESTHESIA;  Surgeon: Radiologist, Medication, MD;  Location: [place];  Service: Radiology;  Laterality: Left;   RADIOLOGY WITH ANESTHESIA N/A 10/03/2021   Procedure: MRI CERVICAL SPINE WITH ANESTHESIA;  Surgeon: Radiologist, Medication, MD;  Location: [place];  Service: Radiology;  Laterality: N/A;   REVERSE SHOULDER ARTHROPLASTY Left    ROTATOR CUFF REPAIR  11/2010   Dr [name] Heck   TONSILLECTOMY     TOTAL HIP ARTHROPLASTY Right 02/24/2009   TOTAL HIP ARTHROPLASTY Left 06/2016   Dr Harlow Mares    FAMILY HISTORY: family history includes Gout in his father; Heart attack (age of onset: 37) in his father; Heart disease in his sister; Hypertension in his mother; Stroke in his mother.  SOCIAL HISTORY:  reports  that he has never smoked. He has never used smokeless tobacco. He reports that he does not currently use alcohol. He reports that he does not use drugs.  ALLERGIES: Patient has  no known allergies.  MEDICATIONS:  Current Facility-Administered Medications  Medication Dose Route Frequency Provider Last Rate Last Admin   0.9 %  sodium chloride infusion  250 mL Intravenous Continuous Candee Furbish, MD       acetaminophen (TYLENOL) tablet 650 mg  650 mg Oral Q6H PRN Meade Maw, MD   650 mg at 10/10/21 1610   Or   acetaminophen (TYLENOL) suppository 650 mg  650 mg Rectal Q6H PRN Meade Maw, MD       ascorbic acid (VITAMIN C) tablet 500 mg  500 mg Oral Daily Meade Maw, MD   500 mg at 10/10/21 9604   Chlorhexidine Gluconate Cloth 2 % PADS 6 each  6 each Topical Q0600 Candee Furbish, MD   6 each at 10/07/21 1648   clonazePAM (KLONOPIN) tablet 0.5 mg  0.5 mg Oral BID Candee Furbish, MD   0.5 mg at 10/10/21 5409   dexamethasone (DECADRON) injection 2 mg  2 mg Intravenous Q12H Candee Furbish, MD   2 mg at 10/10/21 8119   dextromethorphan-guaiFENesin ([place] DM) 30-600 MG per 12 hr tablet 1 tablet  1 tablet Oral BID Meade Maw, MD   1 tablet at 10/10/21 1478   docusate sodium (COLACE) capsule 100 mg  100 mg Oral BID Benita Gutter, RPH   100 mg at 10/10/21 0808   [START ON 10/11/2021] famotidine (PEPCID) tablet 20 mg  20 mg Oral Daily Benita Gutter, RPH       feeding supplement (ENSURE ENLIVE / ENSURE PLUS) liquid 237 mL  237 mL Oral TID BM Candee Furbish, MD   237 mL at 10/10/21 0809   FLUoxetine (PROZAC) capsule 20 mg  20 mg Oral QHS Candee Furbish, MD       hydrALAZINE (APRESOLINE) injection 5 mg  5 mg Intravenous Q6H PRN Emeterio Reeve, DO       insulin aspart (novoLOG) injection 0-5 Units  0-5 Units Subcutaneous QHS Teressa Lower, NP       insulin aspart (novoLOG) injection 0-9 Units  0-9 Units Subcutaneous TID WC Teressa Lower, NP   1 Units at 10/09/21 1628   levothyroxine (SYNTHROID) tablet 88 mcg  88 mcg Oral Q0600 Meade Maw, MD   88 mcg at 10/10/21 0609   loratadine (CLARITIN) tablet 10 mg  10 mg Oral BID  Meade Maw, MD   10 mg at 10/10/21 2956   magnesium hydroxide (MILK OF MAGNESIA) suspension 30 mL  30 mL Oral Daily PRN Meade Maw, MD   30 mL at 10/05/21 0744   melatonin tablet 5 mg  5 mg Oral QHS PRN Meade Maw, MD   5 mg at 10/09/21 2135   meloxicam (MOBIC) tablet 15 mg  15 mg Oral Daily Meade Maw, MD   15 mg at 10/09/21 2136   multivitamin with minerals tablet 1 tablet  1 tablet Oral Daily Meade Maw, MD   1 tablet at 10/10/21 0808   ondansetron (ZOFRAN) tablet 4 mg  4 mg Oral Q6H PRN Meade Maw, MD       Or   ondansetron [facility]) injection 4 mg  4 mg Intravenous Q6H PRN Meade Maw, MD       oxyCODONE-acetaminophen (PERCOCET/ROXICET) 5-325 MG per tablet 1-2 tablet  1-2 tablet Oral  Q6H PRN Candee Furbish, MD       polyethylene glycol [facility] / GLYCOLAX) packet 17 g  17 g Oral Daily Benita Gutter, [place]   17 g at 10/10/21 0809   potassium chloride (KLOR-CON M) CR tablet 10 mEq  10 mEq Oral QHS Meade Maw, MD   10 mEq at 10/09/21 2136   riluzole (RILUTEK) tablet 50 mg  50 mg Oral Q12H Thompson, Amy C, RPH   50 mg at 10/10/21 0809   rosuvastatin (CRESTOR) tablet 5 mg  5 mg Oral Daily Meade Maw, MD   5 mg at 10/10/21 0240   sodium chloride (OCEAN) 0.65 % nasal spray 1 spray  1 spray Each Nare PRN Candee Furbish, MD   1 spray at 10/09/21 2206   tamsulosin (FLOMAX) capsule 0.4 mg  0.4 mg Oral Daily Meade Maw, MD   0.4 mg at 10/10/21 0809    ECOG PERFORMANCE STATUS:  2 - Symptomatic, <50% confined to bed  REVIEW OF SYSTEMS: Patient denies any weight loss, fatigue, weakness, fever, chills or night sweats. Patient denies any loss of vision, blurred vision. Patient denies any ringing  of the ears or hearing loss. No irregular heartbeat. Patient denies heart murmur or history of fainting. Patient denies any chest pain or pain radiating to her upper extremities. Patient denies any shortness of breath, difficulty  breathing at night, cough or hemoptysis. Patient denies any swelling in the lower legs. Patient denies any nausea vomiting, vomiting of blood, or coffee ground material in the vomitus. Patient denies any stomach pain. Patient states has had normal bowel movements no significant constipation or diarrhea. Patient denies any dysuria, hematuria or significant nocturia. Patient denies any problems walking, swelling in the joints or loss of balance. Patient denies any skin changes, loss of hair or loss of weight. Patient denies any excessive worrying or anxiety or significant depression. Patient denies any problems with insomnia. Patient denies excessive thirst, polyuria, polydipsia. Patient denies any swollen glands, patient denies easy bruising or easy bleeding. Patient denies any recent infections, allergies or URI. Patient "s visual fields have not changed significantly in recent time.   PHYSICAL EXAM: BP (!) 140/80   Pulse 91   Temp 98.5 F (36.9 C) (Oral)   Resp (!) 23   Ht '5\' 9"'$  (1.753 m)   Wt 201 lb 4.5 oz (91.3 kg)   SpO2 100%   BMI 29.72 kg/m  Patient has slight decrease in strength in the left lower extremity.  Proprioception appears intact.  No sensory level is noted.  Well-developed well-nourished patient in NAD. HEENT reveals PERLA, EOMI, discs not visualized.  Oral cavity is clear. No oral mucosal lesions are identified. Neck is clear without evidence of cervical or supraclavicular adenopathy. Lungs are clear to A&P. Cardiac examination is essentially unremarkable with regular rate and rhythm without murmur rub or thrill. Abdomen is benign with no organomegaly or masses noted. Motor sensory and DTR levels are equal and symmetric in the upper and lower extremities. Cranial nerves II through XII are grossly intact. Proprioception is intact. No peripheral adenopathy or edema is identified. No motor or sensory levels are noted. Crude visual fields are within normal range.  LABORATORY DATA:  Pathology reports reviewed    RADIOLOGY RESULTS: MRI scans reviewed PSMA PET scan has been ordered   IMPRESSION: Widespread metastatic prostate cancer with significant evolvement of the cervical spine status post stabilization by neurosurgery and 66 year old male  PLAN: At this time Dr. Maryann Alar asked  me to delay radiation till Labor Day.  I have set the patient up a week before Labor Day for simulation so we can start right after Labor Day with palliative radiation therapy with cervical spine.  We will plan on delivering 30 Gray in 10 fractions.  Risks and benefits of treatment eluding possible radiation esophagitis skin reaction fatigue alteration blood counts were reviewed with the patient.  I also would like to see PSMA PET scan prior to starting any other palliative treatment.  With his wide vast involvement of metastatic disease hopefully ADT therapy will be helpful rather than treating vast areas of bone with palliative radiation.  All of this was explained to the patient and his wife.  Outpatient simulation appointment was given prior to Labor Day.  I would like to take this opportunity to thank you for allowing me to participate in the care of your patient.Noreene Filbert, MD

## 2021-10-10 NOTE — TOC Progression Note (Signed)
Transition of Care [facility]) - Progression Note    Patient Details  Name: Jason [name] MRN: [id number] Date of Birth: April 14, 1955  Transition of [facility]) CM/SW Contact  Shelbie Hutching, RN Phone Number: 10/10/2021, 3:28 PM  Clinical Narrative:    Patient admitted to the hospital with cervical spinal stenosis, found to have widely metastatic prostate cancer.  S/P cervical laminectomy with tumor resection.  Patient is from home with his wife.  Current PT and OT recommendation are for CIR.  CIR is following.  Palliative has also been consulted for [place].  TOC will follow and assist with disposition.   Patient is currently in the ICU, acute oxygen [place] at 3L, requiring pressors intermittently to maintain blood pressure.    Expected Discharge Plan:  (TBD) Barriers to Discharge: Continued Medical Work up  Expected Discharge Plan and Services Expected Discharge Plan:  (TBD)   Discharge Planning Services: CM Consult   Living arrangements for the past 2 months: Single Family Home                                       Social Determinants of Health (SDOH) Interventions    Readmission Risk Interventions     No data to display

## 2021-10-10 NOTE — Progress Notes (Signed)
Inpatient Rehab Admissions Coordinator:   Per PT/OT recommendations pt was screened for CIR by Shann Medal, PT, DPT.  Note pt with extensive metastatic disease and rapid progression of BLE weakness during this hospitalization with recommendations for palliative radiation, but not to start prior to Labor Day.  Will likely need extensive 24/7 physical care, likely requiring 2 caregivers providing physical assist for all mobility/ADLs, at discharge.  If family able to provide this, and pt open to intensive 3 hrs/day rehab for family education and DME needs, would consider placing IP Rehab MD Consult.  Note that palliative consult pending for goals of care, which may be helpful to pt/family in determining whether they want to pursue CIR versus home.   Shann Medal, PT, DPT Admissions Coordinator [phone] 10/10/21  3:19 PM

## 2021-10-10 NOTE — Progress Notes (Signed)
Per Neurosurgeon pt MAP goal of 80 can be dc'd sat AM, ICU MD made aware

## 2021-10-10 NOTE — Progress Notes (Signed)
   NAME:  Jason [name], MRN:  [id number], DOB:  [birth year], LOS: 5 ADMISSION DATE:  10/04/2021, CONSULTATION DATE:  10/07/21 REFERRING MD:  Sheppard Coil, CHIEF COMPLAINT:  weakness   History of Present Illness:  66 year old man who presented with neck/shoulder pain found to have widely metastatic cancer suspicious for prostate s/p emergent C6 cervical laminectomy with resection of epidural spinal tumor.  He is POD 1 and having some worsening shock despite fluids.  Becoming more confused, lethargic so PCCM consulted.  Pertinent  Medical History  HTN Hypothyroidism Allergy GERD  Arthritis   Significant Hospital Events: Including procedures, antibiotic start and stop dates in addition to other pertinent events   8/12 admitted with chronic neck pain and worsening L hand paresthesias, L arm weakness 8/14  Pt transferred to ICU with hypotension along with significant weakness in upper and lower extremities.  Required mechanical intubation for MRI Cervical Spine due to severe claustrophobia  8/14: MR Cervical Spine revealed interval bilateral C6        hemilaminectomies. Similar degree of epidural tumor from          C4-C7 without definite superimposed epidural hematoma or        fluid collection. Progressive spinal canal stenosis from C4-C7,        severe at C5-C6 and C6-C7. No abnormal spinal cord signal or        cord infarct. Similar diffuse cervical osseous metastatic disease. 8/15: Pt remains mechanically intubated.  Orders placed for MR Brain, Lumbar Spine, and Thoracic Spine to further assess etiology of acute BLE weakness/numbness.  Extubated later in day.   Interim History / Subjective:  Still needs levophed intermittently. Lower ext paralysis stable. Anxious. Wife at bedside.  Objective   Blood pressure 128/70, pulse 93, temperature 98.5 F (36.9 C), temperature source Oral, resp. rate (!) 25, height '5\' 9"'$  (1.753 m), weight 91.3 kg, SpO2 100 %.        Intake/Output Summary  (Last 24 hours) at 10/10/2021 0955 Last data filed at 10/10/2021 4536 Gross per 24 hour  Intake 176.73 ml  Output 930 ml  Net -753.27 ml    Filed Weights   10/04/21 1656 10/07/21 1645  Weight: 85 kg 91.3 kg    Examination: No distress Poor inspiratory effort Ext warm Proximal upper arm weakness and complete paresis of bilateral lower ext stable Diminished sensorium in lowers but still present, also stable Foley/central line in place All of above stable 8/16-8/17  LFTs up slightly unclear reason Renal function stable CBC stable  Resolved Hospital Problem list   N/A  Assessment & Plan:  Spinal shock- related to extensive metastatic disease, improved/resolved, currently on MAP push 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   Small R parietal stroke- doubt has much to do with presentation, will have neuro see at oncology request - Appreciate PT/OT input - Posterior cervical incision management and MAP goals per NSGY, continue levophed for MAP > 80 for now until I hear otherwise - Wean off dexamethasone to promote wound healing - Void trial - Neurology consult - Firmagon per oncology - Eventual radiation therapy once surgical bed heals - Prozac and klonipin started for understandable anxiety issues - Graded pain management as ordered - Stable for transfer out of ICU once levophed off  31 min cc time Erskine Emery MD PCCM

## 2021-10-10 NOTE — Evaluation (Signed)
Occupational Therapy Evaluation Patient Details Name: Jason [name] MRN: [id number] DOB: 1956/02/02 Today's Date: 10/10/2021   History of Present Illness Patient is a 66 year old male with Metastatic disease involving retroperitoneal and pelvic lymphadenopathy, extensive spine and calcarial metastasis/epidural tumor cord compression disease.  Status post C6 laminectomy for resection of C6 epidural mass. Surgical pathology showed metastatic adenocarcinoma, compatible with prostatic primary. MRI of the brain shows calvarial metastatic disease most notably in the right temporal region and at the vertex, Additional extracranial extension of tumor into the scalp along the vertex, small focus of diffusion restriction in the right parietal cortex, possible acute infarct.   Clinical Impression   Chart reviewed, RN cleared pt for participation in OT evaluation. Co tx completed with PT on this date. Pt is alert and oriented x4, wife present throughout. PTA pt was MOD I-I in all ADL/IADL, working at [employer]. Pt reports ongoing deficits with LUE function following shoulder surgery earlier this year. Pt presents with deficits in strength, endurance, activity tolerance, BUE function all affecting safe and optimal ADL completion. Tenodesis noted in B hands. TOTAL A +2 required for bed mobility with at least MOD A required for static sitting on edge of bed. MAX-TOTAL A required for all ADLs at this time.At this time, recommend discharge to acute rehab pending goals of care to address functional deficits. Pt is left as received, NAD, all needs met. All lines/leads intact. OT will follow acutely.      Recommendations for follow up therapy are one component of a multi-disciplinary discharge planning process, led by the attending physician.  Recommendations may be updated based on patient status, additional functional criteria and insurance authorization.   Follow Up Recommendations  Acute inpatient rehab (3hours/day)     Assistance Recommended at Discharge Frequent or constant Supervision/Assistance  Patient can return home with the following Two people to help with bathing/dressing/bathroom;Two people to help with walking and/or transfers    Functional Status Assessment  Patient has had a recent decline in their functional status and demonstrates the ability to make significant improvements in function in a reasonable and predictable amount of time.  Equipment Recommendations  BSC/3in1;Tub/shower seat;Wheelchair (measurements OT);Wheelchair cushion (measurements OT);Hospital bed    Recommendations for Other Services       Precautions / Restrictions Precautions Precautions: Fall;Cervical Precaution Comments: C6 laminectomy, R radial A-line with MAP >80 Restrictions Weight Bearing Restrictions: No      Mobility Bed Mobility Overal bed mobility: Needs Assistance Bed Mobility: Supine to Sit, Sit to Supine     Supine to sit: Total assist, +2 for physical assistance Sit to supine: Total assist, +2 for physical assistance   General bed mobility comments: Map within 90s-110 seated at edge of bed    Transfers                          Balance Overall balance assessment: Needs assistance Sitting-balance support: Feet supported, No upper extremity supported Sitting balance-Leahy Scale: Zero Sitting balance - Comments: At least MOD A required for static sitting balance, lateral lean to the R noted                                   ADL either performed or assessed with clinical judgement   ADL Overall ADL's : Needs assistance/impaired Eating/Feeding: Maximal assistance;Bed level   Grooming: Wash/dry face;Maximal assistance;Bed level  Upper Body Dressing : Maximal assistance Upper Body Dressing Details (indicate cue type and reason): anticipated Lower Body Dressing: Maximal assistance       Toileting- Clothing Manipulation and Hygiene: Maximal  assistance;Total assistance Toileting - Clothing Manipulation Details (indicate cue type and reason): pt with foley at this time, MAX-TOTAL A for peri care     Functional mobility during ADLs: Maximal assistance;Total assistance;+2 for physical assistance;+2 for safety/equipment       Vision Baseline Vision/History: 1 Wears glasses Patient Visual Report: No change from baseline       Perception     Praxis      Pertinent Vitals/Pain Pain Assessment Pain Assessment: 0-10 Pain Score: 3  Pain Location: L shoulder Pain Descriptors / Indicators: Discomfort Pain Intervention(s): Limited activity within patient's tolerance, Repositioned, Monitored during session     Hand Dominance Right   Extremity/Trunk Assessment Upper Extremity Assessment Upper Extremity Assessment: Generalized weakness;RUE deficits/detail;LUE deficits/detail RUE Deficits / Details: R hand dominant; Mild right tenodesis, AROM; shoulder flexion 90 degrees, elbow WFL, wrist limited by aline; Strength 3/5 in elbow; Weak grip strength with pt unable to make a fist; proprioception intact, sensation intact; edema noted throughout B hands RUE Coordination: decreased fine motor;decreased gross motor LUE Deficits / Details: Baseline pt with LUE deficits following shouler surgery earlier this year; AROM:shoulder flexion: 1/4 full AROM, elbow 3/4 full AROM, wrist with L tenodesis proprioception intact, sensation intact; pt unable to  make a fist, weak grip strength LUE Coordination: decreased fine motor;decreased gross motor   Lower Extremity Assessment Lower Extremity Assessment: Defer to PT evaluation RLE Deficits / Details: 0/5 throughout. 3 beats of clonus noted with quick dorsiflexion stretch. hypertonicity. RLE Sensation: decreased light touch (great toe flexion/extension proprioception intact) RLE Coordination: decreased gross motor;decreased fine motor LLE Deficits / Details: trace hamstring activation palpated,  trace dorsiflexion, trace hip adduction. otherwise 0/5. absent light touch and deep pressure upper/lateral thigh. 3 beats of clonus noted with quick dorsiflexion strecth. hypertonicity LLE Sensation: decreased light touch (great toe flexion/extension proprioception intact) LLE Coordination: decreased gross motor;decreased fine motor   Cervical / Trunk Assessment Cervical / Trunk Assessment: Neck Surgery   Communication Communication Communication: No difficulties   Cognition Arousal/Alertness: Awake/alert Behavior During Therapy: WFL for tasks assessed/performed (pt is anxious, tearful throughout) Overall Cognitive Status: Within Functional Limits for tasks assessed                                       General Comments       Exercises     Shoulder Instructions      Home Living Family/patient expects to be discharged to:: Private residence Living Arrangements: Spouse/significant other Available Help at Discharge: Family Type of Home: House Home Access: Stairs to enter Technical brewer of Steps: 3 Entrance Stairs-Rails: Left Home Layout: One level     Bathroom Shower/Tub: Teacher, early years/pre: Standard     Home Equipment: Conservation officer, nature (2 wheels);Cane - single point;Other (comment);Grab bars - tub/shower          Prior Functioning/Environment Prior Level of Function : Independent/Modified Independent             Mobility Comments: indep no AD, no falls ADLs Comments: indep ADL/IADL, works at [employer] improvement        OT Problem List: Decreased strength;Decreased range of motion;Decreased activity tolerance;Impaired balance (sitting and/or standing);Decreased knowledge of use  of DME or AE;Impaired sensation;Impaired UE functional use;Pain      OT Treatment/Interventions: Self-care/ADL training;Patient/family education;Therapeutic exercise;Balance training;Energy conservation;Therapeutic activities;DME and/or AE  instruction;Splinting    OT Goals(Current goals can be found in the care plan section) Acute Rehab OT Goals Patient Stated Goal: get stronger OT Goal Formulation: With patient/family Time For Goal Achievement: 10/24/21 Potential to Achieve Goals: Good ADL Goals Pt Will Perform Grooming: with mod assist Pt Will Perform Upper Body Dressing: with mod assist Pt Will Transfer to Toilet: with max assist;bedside commode Pt Will Perform Toileting - Clothing Manipulation and hygiene: with mod assist;sitting/lateral leans  OT Frequency: Min 4X/week    Co-evaluation PT/OT/[name] Co-Evaluation/Treatment: Yes Reason for Co-Treatment: Complexity of the patient's impairments (multi-system involvement);To address functional/ADL transfers;For patient/therapist safety PT goals addressed during session: Mobility/safety with mobility;Balance OT goals addressed during session: ADL's and self-care      AM-PAC OT "6 Clicks" Daily Activity     Outcome Measure Help from another person eating meals?: A Lot Help from another person taking care of personal grooming?: A Lot Help from another person toileting, which includes using toliet, bedpan, or urinal?: Total Help from another person bathing (including washing, rinsing, drying)?: Total Help from another person to put on and taking off regular upper body clothing?: A Lot Help from another person to put on and taking off regular lower body clothing?: Total 6 Click Score: 9   End of Session    Activity Tolerance: Patient tolerated treatment well Patient left: in bed;with call bell/phone within reach;with family/visitor present (MD in room)  OT Visit Diagnosis: Unsteadiness on feet (R26.81);Muscle weakness (generalized) (M62.81);Other abnormalities of gait and mobility (R26.89);Other symptoms and signs involving the nervous system (R29.898)                Time: 8882-8003 OT Time Calculation (min): 45 min Charges:  OT General Charges $OT Visit: 1 Visit OT  Evaluation $OT Eval High Complexity: 1 High OT Treatments $Self Care/Home Management : 8-22 mins  Shanon Payor, OTD OTR/L  10/10/21, 1:33 PM

## 2021-10-10 NOTE — Progress Notes (Signed)
    Attending Progress Note  History: Jason [name] is s/p C6 laminectomy for resection of epidural mass  POD4: continues to have weakness in his arms and legs.  He states he does have sensation in his legs however.  He also endorses significant anxiety regarding his recent metastatic cancer diagnosis  POD2: Pt was transferred to the ICU after yesterdays events. Pt remains intubated and sedated this morning. Wife at bedside.   POD1 PM: I was contacted to see [name] at approximately 1615.  I saw him in his room at approximately 1620, and was able to review the clinical situation with his wife.  From review with her, it seems that Mr. [name] was having some issues with his arms when she arrived at around 52, when he reported some difficulty feeding himself with lunch.  Per nursing, his nurses also noted that he was having some difficulty with movement of his arms and legs, but he improved with a fluid bolus around 1300.   When I saw him at 1620, he was arousable but not readily interactive with examination.  He moved his arms but was not reliable. He was noted to be significantly hypotensive.  ICU attending Dr. Tamala Julian, hospitalist Dr. Sheppard Coil, and I reviewed the case and all agreed transfer to ICU was appropriate.  This was done, and his blood pressure improved with another fluid bolus.  I then reevaluated him, noted below.  POD1 AM: Increased tingling in right hand this morning. Reports good pain control  Physical Exam: Vitals:   10/10/21 0600 10/10/21 0800  BP: 139/64 128/70  Pulse: 88 93  Resp: (!) 23 (!) 25  Temp:  98.5 F (36.9 C)  SpO2: 100% 100%    AA Ox3 becomes tearful when discussing his prognosis CNI 4/5 bilateral deltoids 3/5 left triceps and 2/5 left bicep 3/5 right triceps and bicep. 2/5 bilateral HG. 0/5 bilateral legs Incision covered with clean ABD pad  Data:  Recent Labs  Lab 10/08/21 0355 10/09/21 0429 10/10/21 0455  NA 135 136 136  K 4.0 4.1 3.8   CL 103 108 107  CO2 '22 23 24  '$ BUN 26* 29* 46*  CREATININE 0.84 0.69 0.82  GLUCOSE 163* 130* 143*  CALCIUM 8.3* 8.2* 8.2*    Recent Labs  Lab 10/10/21 0455  AST 69*  ALT 51*  ALKPHOS 171*      Recent Labs  Lab 10/08/21 0355 10/09/21 0429 10/10/21 0455  WBC 14.5* 16.4* 15.3*  HGB 10.2* 10.0* 8.8*  HCT 30.4* 30.4* 26.5*  PLT 142* 137* 175    No results for input(s): "APTT", "INR" in the last 168 hours.       Other tests/results:  Pathology pending  MRI pending  Assessment/Plan:  Jason [name] is a 66 y.o s/p C6 laminectomy for resection of epidural mass.  - Appreciate ICU care and hospitalist care.  We have discussed maintaining normal BP with goal MAP>80. - Continue steroids - Please call with any questions or concerns  Cooper Render PA-C Department of Neurosurgery

## 2021-10-10 NOTE — Consult Note (Signed)
NEUROLOGY CONSULTATION NOTE   Date of service: October 10, 2021 Patient Name: Jason [name] MRN:  [id number] DOB:  [birth year] Reason for consult: acute ischemic stroke Requesting physician: Dr. Ina Homes _ _ _   _ __   _ __ _ _  __ __   _ __   __ _  History of Present Illness   This is a 66 yo man who presented to ED with progressive neck and shoulder pain and some paresthesias of L hand. 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 Surgery was on 8/13. Palliative radiation therapy is planned to cervical spine. Neurology is requested to consult regarding MRI finding on 8/15 of small acute ischemic R parietal infarct (personally reviewed). TTE 8/15 showed no e/o intracardiac clot but did not include bubble study.   ROS   Per HPI: all other systems reviewed and are negative  Past History   I have reviewed the following:  Past Medical History:  Diagnosis Date   Allergy    Arthritis    osteoarthritis   GERD (gastroesophageal reflux disease)    Hypertension    Hypothyroidism    Thyroid disease    Hyperthyroidism s/p radioactive iodine ablation   Past Surgical History:  Procedure Laterality Date   CARDIAC CATHETERIZATION  03/2000   HERNIA REPAIR  2876   umbilical   LEFT HEART CATH AND CORONARY ANGIOGRAPHY Left 03/20/2020   Procedure: LEFT HEART CATH AND CORONARY ANGIOGRAPHY;  Surgeon: Nelva Bush, MD;  Location: [facility];  Service: Cardiovascular;  Laterality: Left;   POSTERIOR CERVICAL FUSION/FORAMINOTOMY N/A 10/06/2021   Procedure: POSTERIOR CERVICAL FUSION/ FORAMINOTOMY LEVEL 3;  Surgeon: Meade Maw, MD;  Location: [facility];  Service: Neurosurgery;  Laterality: N/A;  will need monitoring   RADIOLOGY WITH ANESTHESIA Left 04/18/2021   Procedure: MRI SHOULDER WITHOUT CONTRAST WITH ANESTHESIA;  Surgeon: Radiologist,  Medication, MD;  Location: [facility];  Service: Radiology;  Laterality: Left;   RADIOLOGY WITH ANESTHESIA N/A 10/03/2021   Procedure: MRI CERVICAL SPINE WITH ANESTHESIA;  Surgeon: Radiologist, Medication, MD;  Location: [facility];  Service: Radiology;  Laterality: N/A;   REVERSE SHOULDER ARTHROPLASTY Left    ROTATOR CUFF REPAIR  11/2010   Dr [name] Heck   TONSILLECTOMY     TOTAL HIP ARTHROPLASTY Right 02/24/2009   TOTAL HIP ARTHROPLASTY Left 06/2016   Dr Harlow Mares   Family History  Problem Relation Age of Onset   Hypertension Mother    Stroke Mother    Gout Father    Heart attack Father 6   Heart disease Sister    Colon cancer Neg Hx    Esophageal cancer Neg Hx    Rectal cancer Neg Hx    Stomach cancer Neg Hx    Social History   Socioeconomic History   Marital status: Married    Spouse name: [name]    Number of children: 0   Years of education: Not on file   Highest education level: Not on file  Occupational History   Occupation: Therapist, occupational: [employer]  Tobacco Use   Smoking status: Never   Smokeless tobacco: Never  Vaping Use   Vaping Use: Never used  Substance and Sexual Activity   Alcohol use: Not Currently    Comment: Alcohol once every few weeks/months   Drug use: No   Sexual activity: Yes  Other Topics  Concern   Not on file  Social History Narrative   Lives at home with spouse.    Social Determinants of Health   Financial Resource Strain: Not on file  Food Insecurity: Not on file  Transportation Needs: Not on file  Physical Activity: Not on file  Stress: Not on file  Social Connections: Not on file   No Known Allergies  Medications   Medications Prior to Admission  Medication Sig Dispense Refill Last Dose   acetaminophen (TYLENOL) 500 MG tablet Take 1,000 mg by mouth in the morning, at noon, and at bedtime.   Past Week   Ascorbic Acid (VITA-C PO) Take 1,000 mg by mouth daily.   Past Week   cetirizine (ZYRTEC) 10 MG tablet Take 10 mg by mouth at  bedtime.   Past Week   CRANBERRY PO Take 500 mg by mouth at bedtime.   Past Week   cyclobenzaprine (FLEXERIL) 10 MG tablet TAKE 1 TABLET BY MOUTH EVERY DAY AT NIGHT AS NEEDED (Patient taking differently: Take 10 mg by mouth at bedtime. TAKE 1 TABLET BY MOUTH EVERY DAY AT NIGHT AS NEEDED) 90 tablet 1 Past Week   levothyroxine (SYNTHROID) 88 MCG tablet TAKE 1 TABLET BY MOUTH EVERY DAY (Patient taking differently: Take 88 mcg by mouth daily before breakfast.) 90 tablet 3 Past Week   lisinopril (ZESTRIL) 20 MG tablet TAKE 1 TABLET BY MOUTH EVERY DAY 90 tablet 1 Past Week   loratadine (CLARITIN) 10 MG tablet Take 10 mg by mouth in the morning and at bedtime.   Past Week   meloxicam (MOBIC) 15 MG tablet Take 1 tablet (15 mg total) by mouth daily. 7 tablet 0 Past Week   metoprolol succinate (TOPROL-XL) 100 MG 24 hr tablet TAKE 1 TABLET BY MOUTH EVERY DAY WITH OR IMMEDIATELY FOLLOWING A MEAL (Patient taking differently: Take 100 mg by mouth daily.) 90 tablet 1 Past Week   Multiple Vitamin (MULTIVITAMIN) tablet Take 1 tablet by mouth daily.   Past Week   potassium chloride (KLOR-CON) 10 MEQ tablet TAKE 1 TABLET BY MOUTH EVERY DAY (Patient taking differently: Take 10 mEq by mouth at bedtime.) 90 tablet 0 Past Week   rosuvastatin (CRESTOR) 5 MG tablet TAKE 1 TABLET (5 MG TOTAL) BY MOUTH DAILY. 90 tablet 2 Past Week   spironolactone (ALDACTONE) 25 MG tablet TAKE 1/2 TABLET BY MOUTH EVERY DAY 45 tablet 2 Past Week   tamsulosin (FLOMAX) 0.4 MG CAPS capsule TAKE 1 CAPSULE BY MOUTH EVERY DAY 90 capsule 3 Past Week   torsemide (DEMADEX) 10 MG tablet TAKE 1 TABLET BY MOUTH EVERY DAY 90 tablet 1 Past Week   amoxicillin (AMOXIL) 500 MG capsule Take 2,000 mg by mouth See admin instructions. Take 4 tablets by mouth prior to dental procedures. (Patient not taking: Reported on 10/05/2021)   Not Taking   Menthol, Topical Analgesic, (ICY HOT) 5 % PTCH Apply 1-2 patches topically daily as needed (pain).   prn at prn    oxyCODONE-acetaminophen (PERCOCET/ROXICET) 5-325 MG tablet Take 1 tablet by mouth every 4 (four) hours as needed for severe pain. 30 tablet 0 prn at prn   sildenafil (REVATIO) 20 MG tablet Take 3-5 tablets (60-100 mg total) by mouth daily as needed. 50 tablet 11 prn at prn      Current Facility-Administered Medications:    0.9 %  sodium chloride infusion, 250 mL, Intravenous, Continuous, Candee Furbish, MD   acetaminophen (TYLENOL) tablet 650 mg, 650 mg, Oral, Q6H PRN, 650  mg at 10/10/21 1737 **OR** acetaminophen (TYLENOL) suppository 650 mg, 650 mg, Rectal, Q6H PRN, Meade Maw, MD   ascorbic acid (VITAMIN C) tablet 500 mg, 500 mg, Oral, Daily, Meade Maw, MD, 500 mg at 10/10/21 2778   aspirin chewable tablet 81 mg, 81 mg, Oral, Once, Candee Furbish, MD   Chlorhexidine Gluconate Cloth 2 % PADS 6 each, 6 each, Topical, Q0600, Candee Furbish, MD, 6 each at 10/07/21 1648   clonazePAM (KLONOPIN) tablet 0.5 mg, 0.5 mg, Oral, BID, Candee Furbish, MD, 0.5 mg at 10/10/21 2423   dexamethasone (DECADRON) injection 2 mg, 2 mg, Intravenous, Q12H, Candee Furbish, MD, 2 mg at 10/10/21 5361   dextromethorphan-guaiFENesin ([place] DM) 30-600 MG per 12 hr tablet 1 tablet, 1 tablet, Oral, BID, Meade Maw, MD, 1 tablet at 10/10/21 4431   docusate sodium (COLACE) capsule 100 mg, 100 mg, Oral, BID, Benita Gutter, RPH, 100 mg at 10/10/21 5400   enoxaparin (LOVENOX) injection 40 mg, 40 mg, Subcutaneous, Q24H, Candee Furbish, MD   [START ON 10/11/2021] famotidine (PEPCID) tablet 20 mg, 20 mg, Oral, Daily, Benita Gutter, RPH   feeding supplement (ENSURE ENLIVE / ENSURE PLUS) liquid 237 mL, 237 mL, Oral, TID BM, Candee Furbish, MD, 237 mL at 10/10/21 0809   FLUoxetine (PROZAC) capsule 20 mg, 20 mg, Oral, QHS, Candee Furbish, MD   hydrALAZINE (APRESOLINE) injection 5 mg, 5 mg, Intravenous, Q6H PRN, Emeterio Reeve, DO   insulin aspart (novoLOG) injection 0-5 Units, 0-5 Units,  Subcutaneous, QHS, Nelson, Dreama Saa, NP   insulin aspart (novoLOG) injection 0-9 Units, 0-9 Units, Subcutaneous, TID WC, Teressa Lower, NP, 2 Units at 10/10/21 1514   ipratropium-albuterol (DUONEB) 0.5-2.5 (3) MG/3ML nebulizer solution 3 mL, 3 mL, Nebulization, Q6H PRN, Candee Furbish, MD, 3 mL at 10/10/21 1503   levothyroxine (SYNTHROID) tablet 88 mcg, 88 mcg, Oral, Q0600, Meade Maw, MD, 88 mcg at 10/10/21 0609   loratadine (CLARITIN) tablet 10 mg, 10 mg, Oral, BID, Meade Maw, MD, 10 mg at 10/10/21 8676   magnesium hydroxide (MILK OF MAGNESIA) suspension 30 mL, 30 mL, Oral, Daily PRN, Meade Maw, MD, 30 mL at 10/05/21 0744   melatonin tablet 5 mg, 5 mg, Oral, QHS PRN, Meade Maw, MD, 5 mg at 10/09/21 2135   meloxicam (MOBIC) tablet 15 mg, 15 mg, Oral, Daily, Meade Maw, MD, 15 mg at 10/09/21 2136   midodrine (PROAMATINE) tablet 5 mg, 5 mg, Oral, TID WC, Candee Furbish, MD, 5 mg at 10/10/21 1737   multivitamin with minerals tablet 1 tablet, 1 tablet, Oral, Daily, Meade Maw, MD, 1 tablet at 10/10/21 0808   ondansetron (ZOFRAN) tablet 4 mg, 4 mg, Oral, Q6H PRN **OR** ondansetron (ZOFRAN) injection 4 mg, 4 mg, Intravenous, Q6H PRN, Meade Maw, MD   oxyCODONE-acetaminophen (PERCOCET/ROXICET) 5-325 MG per tablet 1-2 tablet, 1-2 tablet, Oral, Q6H PRN, Candee Furbish, MD   polyethylene glycol (MIRALAX / GLYCOLAX) packet 17 g, 17 g, Oral, Daily, Benita Gutter, RPH, 17 g at 10/10/21 0809   potassium chloride (KLOR-CON M) CR tablet 10 mEq, 10 mEq, Oral, QHS, Meade Maw, MD, 10 mEq at 10/09/21 2136   [COMPLETED] riluzole (RILUTEK) tablet 100 mg, 100 mg, Oral, Once, 100 mg at 10/09/21 1228 **FOLLOWED BY** riluzole (RILUTEK) tablet 50 mg, 50 mg, Oral, Q12H, Thompson, Amy C, RPH, 50 mg at 10/10/21 0809   rosuvastatin (CRESTOR) tablet 5 mg, 5 mg, Oral, Daily, Meade Maw, MD, 5 mg at  10/10/21 0808   sodium chloride (OCEAN) 0.65 % nasal  spray 1 spray, 1 spray, Each Nare, PRN, Candee Furbish, MD, 1 spray at 10/09/21 2206   tamsulosin (FLOMAX) capsule 0.4 mg, 0.4 mg, Oral, Daily, Meade Maw, MD, 0.4 mg at 10/10/21 0809  Vitals   Vitals:   10/10/21 1400 10/10/21 1600 10/10/21 1700 10/10/21 1800  BP:  113/69 114/64 119/72  Pulse: 92 85 80 77  Resp:  19 (!) 21 (!) 23  Temp:      TempSrc:      SpO2: 94% 97% 98% 98%  Weight:      Height:         Body mass index is 29.72 kg/m.  Physical Exam   Physical Exam Gen: A&O x3, sparse historian, NAD Resp: normal WOB CV: RRR, no m/g/r; nml S1 and S2. 2+ symmetric peripheral pulses.  Neuro: *MS: A&O x3, follows simple commands *Speech: minimal dysarthria, no aphasia *CN: PERRLA, VFF by confrontation, optic discs unable to be visualized 2/2 pupillary constriction, EOMI w/o nystagmus, no ptosis, Sensation intact from V1 to V3 to LT, Eyelid closure was full.  Smile symmetric. Hearing intact to voice *Motor:   Decreased bulk.  No tremor, rigidity or bradykinesia. No pronator drift.    Strength: Dlt Bic Tri WrE WrF FgS Gr HF KnF KnE PlF DoF    Left '3 2 2 2 2 2 2 '$ 0 0 0 0 0    Right '4 3 3 3 3 3 3 '$ 0 0 0 0 0    *Sensory: SILT BUE, impaired to all modalities BLE. No double-simultaneous extinction BUE, UTA BLE. *Coordination:  UTA 2/2 weakness *Reflexes:  1+ and symmetric BUE, areflexic BLE *Gait: UTA 2/2 weakness  NIHSS  1a Level of Conscious.: 0 1b LOC Questions: 0 1c LOC Commands: 0 2 Best Gaze: 0 3 Visual: 0 4 Facial Palsy: 0 5a Motor Arm - left: 2 5b Motor Arm - Right: 1 6a Motor Leg - Left: 4 6b Motor Leg - Right: 4 7 Limb Ataxia: 0 8 Sensory: 1 9 Best Language: 0 10 Dysarthria: 1 11 Extinct. and Inatten.: 0  TOTAL: 13   Premorbid mRS = 4  Labs   CBC:  Recent Labs  Lab 10/05/21 0025 10/05/21 0511 10/05/21 1302 10/07/21 0308 10/09/21 0429 10/10/21 0455  WBC 12.7*   < > 10.4   < > 16.4* 15.3*  NEUTROABS 8.3*  --  7.5  --   --   --   HGB  13.0   < > 12.5*   < > 10.0* 8.8*  HCT 38.7*   < > 37.2*   < > 30.4* 26.5*  MCV 91.1   < > 90.1   < > 93.3 93.0  PLT 158   < > 158   < > 137* 175   < > = values in this interval not displayed.    Basic Metabolic Panel:  Lab Results  Component Value Date   NA 136 10/10/2021   K 3.8 10/10/2021   CO2 24 10/10/2021   GLUCOSE 143 (H) 10/10/2021   BUN 46 (H) 10/10/2021   CREATININE 0.82 10/10/2021   CALCIUM 8.2 (L) 10/10/2021   GFRNONAA >60 10/10/2021   GFRAA 106 03/14/2020   Lipid Panel:  Lab Results  Component Value Date   LDLCALC 62 01/29/2021   HgbA1c:  Lab Results  Component Value Date   HGBA1C 5.5 10/08/2021   Urine Drug Screen: No results found for: "LABOPIA", "COCAINSCRNUR", "LABBENZ", "  AMPHETMU", "THCU", "LABBARB"  Alcohol Level No results found for: "ETH"   Impression   This is a 66 yo man who presented to ED with progressive neck and shoulder pain and some paresthesias of L hand. 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 Surgery was on 8/13. Palliative radiation therapy is planned to cervical spine. Neurology is requested to consult regarding MRI finding on 8/15 of small acute ischemic R parietal infarct (personally reviewed). His extremity exam is highly limited 2/2 bilateral BUE weakness and sensory deficit and no movement and severe sensory deficit in BLE, therefore it is not clear if he is symptomatic from this small infarct or not. Despite there only being a single small acute infarct on MRI given his widespread cancer embolic source is a possibility 2/2 hypercoagulability 2/2 malignancy. Recommend stroke workup per below.  Recommendations   - Goal normotension >48 hrs out from MRI finding of acute infarct, strict avoidance of hypotension if at all possible - CTA H&N - Bubble study - Even if bubble study neg for PFO, given  possibility of false negative and hypercoagulable state 2/2 malignancy, recommend BLE dopplers r/o DVT - Check A1c and LDL + add statin per guidelines - Per neurosurgery, OK to start aspirin but wait to start plavix until POD day 10 (8/23) plavix should be continued for 13 days after that end date 21 days after MRI finding of acute infarct - q4 hr neuro checks - STAT head CT for any change in neuro exam - Tele - PT/OT/[name] - Stroke education - Amb referral to neurology upon discharge - Consider palliative care consult if c/w patient's GOC  Will continue to follow.  ______________________________________________________________________   Thank you for the opportunity to take part in the care of this patient. If you have any further questions, please contact the neurology consultation attending.  Signed,  Su Monks, MD [facility] [phone]  If 7pm- 7am, please page neurology on call as listed in [place].

## 2021-10-10 NOTE — Evaluation (Signed)
Physical Therapy Evaluation Patient Details Name: Jason [name] MRN: [id number] DOB: [birth year] Today's Date: 10/10/2021  History of Present Illness  Patient is a 66 year old male with Metastatic disease involving retroperitoneal and pelvic lymphadenopathy, extensive spine and calcarial metastasis/epidural tumor cord compression disease.  Status post C6 laminectomy for resection of C6 epidural mass. Surgical pathology showed metastatic adenocarcinoma, compatible with prostatic primary. MRI of the brain shows calvarial metastatic disease most notably in the right temporal region and at the vertex, Additional extracranial extension of tumor into the scalp along the vertex, small focus of diffusion restriction in the right parietal cortex, possible acute infarct.  Clinical Impression  The patient is agreeable to PT evaluation. He reports being independent at baseline without assistive device with chronic L shoulder pain at baseline. 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   Today, the patient has significant strength and sensation deficits in BLE with only trace movement in LLE (see below for details). Bilateral UE have active movement but are functionally weak as well. The patient required +2 person assistance to sit up on edge of bed. Sitting balance is poor with diminished trunk muscle activation requiring external support to maintain sitting balance with loss of balance in all directions. The patient has no dizziness or increased pain while seated at edge of bed. MAP remained in the 90's-110's with seated level activity.  The patient has had a significant decline in functional independence compared to his baseline and has extensive mobility deficits at this time. Pending the patient's goals of care, consider CIR at discharge. PT will continue to follow and recommend the most appropriate discharge plan based on the patient's goals moving forward. Noted Palliative Care consult is  pending as well.      Recommendations for follow up therapy are one component of a multi-disciplinary discharge planning process, led by the attending physician.  Recommendations may be updated based on patient status, additional functional criteria and insurance authorization.  Follow Up Recommendations Acute inpatient rehab (3hours/day) (pending patient's goals of care)      Assistance Recommended at Discharge Frequent or constant Supervision/Assistance  Patient can return home with the following  Two people to help with walking and/or transfers;A lot of help with bathing/dressing/bathroom;Assistance with feeding;Direct supervision/assist for medications management;Direct supervision/assist for financial management;Assist for transportation;Help with stairs or ramp for entrance;Assistance with cooking/housework    Equipment Recommendations Hospital bed;Wheelchair (measurements PT);Wheelchair cushion (measurements PT);BSC/3in1 (custom tilt in space, high back wheelechair)  Recommendations for Other Services       Functional Status Assessment Patient has had a recent decline in their functional status and demonstrates the ability to make significant improvements in function in a reasonable and predictable amount of time.     Precautions / Restrictions Precautions Precautions: Fall;Cervical Precaution Comments: C6 laminectomy, R radial A-line with MAP >80 Restrictions Weight Bearing Restrictions: No      Mobility  Bed Mobility Overal bed mobility: Needs Assistance Bed Mobility: Supine to Sit, Sit to Supine     Supine to sit: Total assist, +2 for physical assistance Sit to supine: Total assist, +2 for physical assistance   General bed mobility comments: minimal to no activate participation with bed mobility efforts. facilition of movement and cues for technique. total assistance + 2 person assistance reuqired. no dizziness is reported with upright activity. MAP remained in the  90's-110's with seated level activity    Transfers  Ambulation/Gait                  Stairs            Wheelchair Mobility    Modified Rankin (Stroke Patients Only)       Balance Overall balance assessment: Needs assistance Sitting-balance support: Feet supported, No upper extremity supported Sitting balance-Leahy Scale: Zero Sitting balance - Comments: minimal protective righting reactions noted. significant weakness throughout and external support required to maintain mildine sitting balance Postural control:  (loss of balance in all directions)                                   Pertinent Vitals/Pain Pain Assessment Pain Assessment: 0-10 Pain Score: 3  Pain Location: L shoulder Pain Descriptors / Indicators: Discomfort Pain Intervention(s): Limited activity within patient's tolerance    Home Living Family/patient expects to be discharged to:: Private residence Living Arrangements: Spouse/significant other Available Help at Discharge: Family Type of Home: House Home Access: Stairs to enter Entrance Stairs-Rails: Left Entrance Stairs-Number of Steps: 3   Home Layout: One level Home Equipment: Conservation officer, nature (2 wheels);Cane - single point;Other (comment);Grab bars - tub/shower      Prior Function Prior Level of Function : Independent/Modified Independent             Mobility Comments: indep no AD, no falls ADLs Comments: indep ADL/IADL, works at [employer] improvement     Hand Dominance   Dominant Hand: Right    Extremity/Trunk Assessment   Upper Extremity Assessment Upper Extremity Assessment: Generalized weakness;RUE deficits/detail;LUE deficits/detail RUE Deficits / Details: R hand dominant; Mild right tenodesis, AROM; shoulder flexion 90 degrees, elbow WFL, wrist limited by aline; Strength 3/5 in elbow; Weak grip strength with pt unable to make a fist; proprioception intact, sensation  intact; edema noted throughout B hands RUE Coordination: decreased fine motor;decreased gross motor LUE Deficits / Details: Baseline pt with LUE deficits following shouler surgery earlier this year; AROM:shoulder flexion: 1/4 full AROM, elbow 3/4 full AROM, wrist with L tenodesis proprioception intact, sensation intact; pt unable to  make a fist, weak grip strength LUE Coordination: decreased fine motor;decreased gross motor    Lower Extremity Assessment Lower Extremity Assessment: Defer to PT evaluation RLE Deficits / Details: 0/5 throughout. 3 beats of clonus noted with quick dorsiflexion stretch. hypertonicity. RLE Sensation: decreased light touch (great toe flexion/extension proprioception intact) RLE Coordination: decreased gross motor;decreased fine motor LLE Deficits / Details: trace hamstring activation palpated, trace dorsiflexion, trace hip adduction. otherwise 0/5. absent light touch and deep pressure upper/lateral thigh. 3 beats of clonus noted with quick dorsiflexion strecth. hypertonicity LLE Sensation: decreased light touch (great toe flexion/extension proprioception intact) LLE Coordination: decreased gross motor;decreased fine motor    Cervical / Trunk Assessment Cervical / Trunk Assessment: Neck Surgery  Communication   Communication: No difficulties  Cognition Arousal/Alertness: Awake/alert Behavior During Therapy: WFL for tasks assessed/performed (mildly anxious and tearful at times) Overall Cognitive Status: Within Functional Limits for tasks assessed                                 General Comments: patient is able to follow single step commands consistently        General Comments      Exercises     Assessment/Plan    PT Assessment Patient needs continued PT services  PT Problem List Decreased strength;Decreased range of motion;Decreased activity tolerance;Decreased balance;Decreased mobility;Decreased coordination;Decreased  cognition;Decreased knowledge of use of DME;Decreased safety awareness;Impaired sensation;Impaired tone       PT Treatment Interventions DME instruction;Functional mobility training;Therapeutic activities;Therapeutic exercise;Balance training;Neuromuscular re-education;Cognitive remediation;Patient/family education;Wheelchair mobility training    PT Goals (Current goals can be found in the Care Plan section)  Acute Rehab PT Goals Patient Stated Goal: to be able to move his legs PT Goal Formulation: With patient/family Time For Goal Achievement: 10/24/21 Potential to Achieve Goals: Fair    Frequency Min 3X/week     Co-evaluation PT/OT/[name] Co-Evaluation/Treatment: Yes Reason for Co-Treatment: Complexity of the patient's impairments (multi-system involvement);To address functional/ADL transfers;For patient/therapist safety PT goals addressed during session: Mobility/safety with mobility;Balance OT goals addressed during session: ADL's and self-care       AM-PAC PT "6 Clicks" Mobility  Outcome Measure Help needed turning from your back to your side while in a flat bed without using bedrails?: Total Help needed moving from lying on your back to sitting on the side of a flat bed without using bedrails?: Total Help needed moving to and from a bed to a chair (including a wheelchair)?: Total Help needed standing up from a chair using your arms (e.g., wheelchair or bedside chair)?: Total Help needed to walk in hospital room?: Total Help needed climbing 3-5 steps with a railing? : Total 6 Click Score: 6    End of Session   Activity Tolerance: Patient tolerated treatment well Patient left: in bed;with call bell/phone within reach;with bed alarm set;with family/visitor present Nurse Communication: Mobility status PT Visit Diagnosis: Muscle weakness (generalized) (M62.81);Other abnormalities of gait and mobility (R26.89);Other symptoms and signs involving the nervous system (R29.898)     Time: 8675-4492 PT Time Calculation (min) (ACUTE ONLY): 46 min   Charges:   PT Evaluation $PT Eval High Complexity: 1 High PT Treatments $Therapeutic Activity: 8-22 mins        Minna Merritts, PT, MPT   Percell Locus 10/10/2021, 1:27 PM

## 2021-10-10 NOTE — Progress Notes (Signed)
Speech Language Pathology Treatment: Dysphagia  Patient Details Name: Jason [name] MRN: [id number] DOB: [birth year] Today's Date: 10/10/2021 Time: 5053-9767 [name] Time Calculation (min) (ACUTE ONLY): 45 min  Assessment / Plan / Recommendation Clinical Impression  Pt seen for ongoing assessment of swallowing; toleration of diet. He was resting in bed w/ Wife present setting up his Lunch tray and beginning to feed him. He was alert, verbally responsive and engaged in conversation w/ [name], Wife -- but often wanting to talk w/ food in his mouth. This along w/ FULL UPRIGHT SITTING w/ Head Forward for any oral intake was discussed w/ pt and Wife to reduce risk for aspiration(general aspiration precautions as rec'd at evaluation yesterday). Per Rounds w/ Team this morning, NSG denied any overt s/s of aspiration w/ breakfast, meds. Wife and pt endorsed same.  OF NOTE: Pt's mild congestion and cough(at rest) and the effort/productivity of his cough appears improved. Pt agreed.  Pt is on [place] O2 at 3L; wbc declining.  Discussed general aspiration precautions, and pt/Wife agreed verbally to the need for following them especially sitting upright for all oral intake -- supported behind the back/head for full upright sitting. Pt assisted w/ positioning d/t weakness.  He was fed ~50+% of his Lunch meal of soft solids, some particulate foods(rice, broccoli), and thin liquids via straw. NO overt clinical s/s of aspiration were noted w/ any consistency; respiratory status remained calm and unlabored, vocal quality clear b/t trials, no cough. O2 sats remained 94-95%.  Oral phase appeared grossly [employer] for bolus management, mastication, and timely A-P transfer for swallowing; oral clearing achieved w/ all consistencies. Pt and Wife were encouraged to, and educated on, taking rest breaks b/t trials of more solid foods d/t exertion of mastication -- noted pt used Mouth Breathing at times. Also encouraged CUTTING foods and  MOISTENING foods well for ease and conservation of energy. Wife agreed.  OF NOTE: pt tended to talk but quickly remembered not to while still chewing foods.   HOWEVER, toward end of session, pt appeared to cough in response to possible piece of food (rice?) from recent bolus "maybe going down the wrong way". Noted pt's cough was min more productive vs at eval but still reduced in effort. Pt's O2 sats lowered to 92%. Coughing resolved but encouraged pt/Wife to rest and return to meal later.    Pt appears at reduced risk for aspriation when following general aspiration precautions. Recommend continue a Regular/mech soft diet for ease of soft foods w/ gravies added to moisten foods; avoid Particulate foods and TALKING/Distractions while eating. Thin liquids. Recommend general aspiration precautions; Pills Whole in Puree; tray setup and UPRIGHT positioning assistance for meals. Feeding Assistance at meals.  ST services will continue to follow for po check and education next 1-2 days. NSG updated. Precautions posted at bedside.       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  He reports that he had a left shoulder arthroplasty in March of this year.  Prior to that, he was having pain around his shoulder blade and left shoulder.  His shoulder arthroplasty did help somewhat, but he has continued to have left-sided weakness with abduction of his left shoulder and with extension of his arm.  This been ongoing for several months.  He has tingling down his left arm into his first 2 digits of his left hand.  Pt extubated yesterday post 1 day oral intubation for MRI.  CXR: There are no signs of pulmonary edema or focal pulmonary  consolidation. There is improvement in aeration of lower  lung fields  suggesting resolving subsegmental atelectasis.      [name] Plan  Continue with current plan of care (po check x1)      Recommendations for follow up therapy are one component of a multi-disciplinary discharge planning process, led by the attending physician.  Recommendations may be updated based on patient status, additional functional criteria and insurance authorization.    Recommendations  Diet recommendations: Regular;Dysphagia 3 (mechanical soft);Thin liquid (cut foods moistened well) Liquids provided via: Cup;Straw (monitor) Medication Administration: Whole meds with puree Supervision: Staff to assist with self feeding;Full supervision/cueing for compensatory strategies Compensations: Minimize environmental distractions;Slow rate;Small sips/bites;Lingual sweep for clearance of pocketing;Follow solids with liquid (Rest Breaks) Postural Changes and/or Swallow Maneuvers: Out of bed for meals;Seated upright 90 degrees;Upright 30-60 min after meal                General recommendations:  (Dietician f/u) Oral Care Recommendations: Oral care BID;Oral care before and after PO;Staff/trained caregiver to provide oral care Follow Up Recommendations:  (TBD) Assistance recommended at discharge: Frequent or constant Supervision/Assistance (feeding support d/t UE weakness) [name] Visit Diagnosis: Dysphagia, unspecified (R13.10) (dependent for feeding currently) Plan: Continue with current plan of care (po check x1)            Jason Kenner, MS, CCC-[name] Speech Language Pathologist Rehab Services; [place] [phone] (ascom) Jason [name]  10/10/2021, 4:32 PM

## 2021-10-11 ENCOUNTER — Inpatient Hospital Stay: Payer: BC Managed Care – PPO

## 2021-10-11 ENCOUNTER — Inpatient Hospital Stay (HOSPITAL_COMMUNITY)
Admit: 2021-10-11 | Discharge: 2021-10-11 | Disposition: A | Discharge status: Home / Self Care | Payer: BC Managed Care – PPO | Attending: Neurology | Admitting: Neurology

## 2021-10-11 DIAGNOSIS — M4802 Spinal stenosis, cervical region: Secondary | ICD-10-CM | POA: Diagnosis not present

## 2021-10-11 DIAGNOSIS — I63521 Cerebral infarction due to unspecified occlusion or stenosis of right anterior cerebral artery: Secondary | ICD-10-CM

## 2021-10-11 DIAGNOSIS — Z515 Encounter for palliative care: Secondary | ICD-10-CM | POA: Diagnosis not present

## 2021-10-11 LAB — COMPREHENSIVE METABOLIC PANEL
ALT: 134 U/L — ABNORMAL HIGH (ref 0–44)
AST: 121 U/L — ABNORMAL HIGH (ref 15–41)
Albumin: 2.8 g/dL — ABNORMAL LOW (ref 3.5–5.0)
Alkaline Phosphatase: 170 U/L — ABNORMAL HIGH (ref 38–126)
Anion gap: 7 (ref 5–15)
BUN: 32 mg/dL — ABNORMAL HIGH (ref 8–23)
CO2: 23 mmol/L (ref 22–32)
Calcium: 7.8 mg/dL — ABNORMAL LOW (ref 8.9–10.3)
Chloride: 106 mmol/L (ref 98–111)
Creatinine, Ser: 0.51 mg/dL — ABNORMAL LOW (ref 0.61–1.24)
GFR, Estimated: >60 mL/min (ref >60)
Glucose, Bld: 93 mg/dL (ref 70–99)
Potassium: 3.7 mmol/L (ref 3.5–5.1)
Sodium: 136 mmol/L (ref 135–145)
Total Bilirubin: 1.4 mg/dL — ABNORMAL HIGH (ref 0.3–1.2)
Total Protein: 5.8 g/dL — ABNORMAL LOW (ref 6.5–8.1)

## 2021-10-11 LAB — HEMOGLOBIN A1C
Hgb A1c MFr Bld: 5.6 % (ref 4.8–5.6)
Mean Plasma Glucose: 114.02 mg/dL

## 2021-10-11 LAB — CBC
HCT: 28.2 % — ABNORMAL LOW (ref 39.0–52.0)
Hemoglobin: 9.2 g/dL — ABNORMAL LOW (ref 13.0–17.0)
MCH: 30.5 pg (ref 26.0–34.0)
MCHC: 32.6 g/dL (ref 30.0–36.0)
MCV: 93.4 fL (ref 80.0–100.0)
Platelets: 173 10*3/uL (ref 150–400)
RBC: 3.02 MIL/uL — ABNORMAL LOW (ref 4.22–5.81)
RDW: 13.9 % (ref 11.5–15.5)
WBC: 13.9 10*3/uL — ABNORMAL HIGH (ref 4.0–10.5)
nRBC: 0.4 % — ABNORMAL HIGH (ref 0.0–0.2)

## 2021-10-11 LAB — LDL CHOLESTEROL, DIRECT: Direct LDL: 62 mg/dL (ref 0–99)

## 2021-10-11 LAB — GLUCOSE, CAPILLARY
Glucose-Capillary: 107 mg/dL — ABNORMAL HIGH (ref 70–99)
Glucose-Capillary: 86 mg/dL (ref 70–99)
Glucose-Capillary: 99 mg/dL (ref 70–99)
Glucose-Capillary: 105 mg/dL — ABNORMAL HIGH (ref 70–99)
Glucose-Capillary: 134 mg/dL — ABNORMAL HIGH (ref 70–99)
Glucose-Capillary: 103 mg/dL — ABNORMAL HIGH (ref 70–99)

## 2021-10-11 MED ORDER — BUDESONIDE 0.5 MG/2ML IN SUSP
0.5000 mg | Freq: Two times a day (BID) | RESPIRATORY_TRACT | Status: DC
  Administered 2021-10-11 – 2021-10-17 (×13): 0.5 mg via RESPIRATORY_TRACT
  Filled 2021-10-11 (×13): qty 2

## 2021-10-11 MED ORDER — IPRATROPIUM-ALBUTEROL 0.5-2.5 (3) MG/3ML IN SOLN
3.0000 mL | RESPIRATORY_TRACT | Status: DC
  Administered 2021-10-11 (×2): 3 mL via RESPIRATORY_TRACT
  Filled 2021-10-11 (×2): qty 3

## 2021-10-11 MED ORDER — IPRATROPIUM-ALBUTEROL 0.5-2.5 (3) MG/3ML IN SOLN
3.0000 mL | Freq: Four times a day (QID) | RESPIRATORY_TRACT | Status: DC
  Administered 2021-10-11 – 2021-10-13 (×6): 3 mL via RESPIRATORY_TRACT
  Filled 2021-10-11 (×7): qty 3

## 2021-10-11 NOTE — Progress Notes (Signed)
    Attending Progress Note  History: Jason [name] is s/p C6 laminectomy for resection of epidural mass  POD5: no significant neurologic changes overnight, Continues to have anxiety.   POD4: continues to have weakness in his arms and legs.  He states he does have sensation in his legs however.  He also endorses significant anxiety regarding his recent metastatic cancer diagnosis  POD2: Pt was transferred to the ICU after yesterdays events. Pt remains intubated and sedated this morning. Wife at bedside.   POD1 PM: I was contacted to see Jason [name] at approximately 1615.  I saw him in his room at approximately 1620, and was able to review the clinical situation with his wife.  From review with her, it seems that Jason [name] was having some issues with his arms when she arrived at around 68, when he reported some difficulty feeding himself with lunch.  Per nursing, his nurses also noted that he was having some difficulty with movement of his arms and legs, but he improved with a fluid bolus around 1300.   When I saw him at 1620, he was arousable but not readily interactive with examination.  He moved his arms but was not reliable. He was noted to be significantly hypotensive.  ICU attending Dr. Tamala Julian, hospitalist Dr. Sheppard Coil, and I reviewed the case and all agreed transfer to ICU was appropriate.  This was done, and his blood pressure improved with another fluid bolus.  I then reevaluated him, noted below.  POD1 AM: Increased tingling in right hand this morning. Reports good pain control  Physical Exam: Vitals:   10/11/21 0400 10/11/21 0826  BP: 124/76   Pulse: 89   Resp: (!) 24   Temp: 98.3 F (36.8 C)   SpO2: 93% 92%    AA Ox3  CNI 4/5 bilateral deltoids 3/5 left triceps and 2/5 left bicep 3/5 right triceps and bicep. 2/5 bilateral HG. 0/5 bilateral legs Incision intact with staples in place and open to air.   Data:  Recent Labs  Lab 10/09/21 0429 10/10/21 0455  10/11/21 0500  NA 136 136 136  K 4.1 3.8 3.7  CL 108 107 106  CO2 '23 24 23  '$ BUN 29* 46* 32*  CREATININE 0.69 0.82 0.51*  GLUCOSE 130* 143* 93  CALCIUM 8.2* 8.2* 7.8*    Recent Labs  Lab 10/11/21 0500  AST 121*  ALT 134*  ALKPHOS 170*      Recent Labs  Lab 10/09/21 0429 10/10/21 0455 10/11/21 0500  WBC 16.4* 15.3* 13.9*  HGB 10.0* 8.8* 9.2*  HCT 30.4* 26.5* 28.2*  PLT 137* 175 173    No results for input(s): "APTT", "INR" in the last 168 hours.       Other tests/results:  Pathology pending  MRI pending  Assessment/Plan:  Jason [name] is a 66 y.o s/p C6 laminectomy for resection of epidural mass.  - Appreciate ICU care and hospitalist care.  We have discussed maintaining normal BP with goal MAP>80 until 10/12/21 am - discontinue steroids  - dressing removed 8/18. Ok to leave incision open to air as long as it is not draining.  - Please call with any questions or concerns  Cooper Render PA-C Department of Neurosurgery

## 2021-10-11 NOTE — Progress Notes (Signed)
*  PRELIMINARY RESULTS* Echocardiogram 2D Echocardiogram has been performed.  Jason [name] 10/11/2021, 9:39 AM

## 2021-10-11 NOTE — Final Progress Note (Signed)
PCCM transfer accepted. TRH will assume attending role on 10/12/2021 at 7 AM

## 2021-10-11 NOTE — Progress Notes (Signed)
Physical Therapy Treatment Patient Details Name: Jason [name] MRN: [id number] DOB: [birth year] Today's Date: 10/11/2021   History of Present Illness Patient is a 66 year old male with Metastatic disease involving retroperitoneal and pelvic lymphadenopathy, extensive spine and calcarial metastasis/epidural tumor cord compression disease.  Status post C6 laminectomy for resection of C6 epidural mass. Surgical pathology showed metastatic adenocarcinoma, compatible with prostatic primary. MRI of the brain shows calvarial metastatic disease most notably in the right temporal region and at the vertex, Additional extracranial extension of tumor into the scalp along the vertex, small focus of diffusion restriction in the right parietal cortex, possible acute infarct.    PT Comments    The patient was agreeable to PT session. He has no movement in BLE other than trace left hip adduction felt when palpated. He reports sensation to light touch in BLE appears to be improving compared to yesterday. The patient continues to required total assistance +2 person for bed mobility. He sat upright at the edge of bed for several minutes, requiring at least moderate assistance to maintain midline sitting balance. Loss of balance in all directions with limited righting reactions. Encouraged deep breathing and coughing while seated. Vitals stable throughout session and MAP remained in the 90's with seated level activity. The patient expressed that his goal is to get to rehab so that his wife can have more assistance and training. Will continue to recommend CIR at this time unless goals of care change.    Recommendations for follow up therapy are one component of a multi-disciplinary discharge planning process, led by the attending physician.  Recommendations may be updated based on patient status, additional functional criteria and insurance authorization.  Follow Up Recommendations  Acute inpatient rehab (3hours/day)      Assistance Recommended at Discharge Frequent or constant Supervision/Assistance  Patient can return home with the following Two people to help with walking and/or transfers;A lot of help with bathing/dressing/bathroom;Assistance with feeding;Direct supervision/assist for medications management;Direct supervision/assist for financial management;Assist for transportation;Help with stairs or ramp for entrance;Assistance with cooking/housework   Equipment Recommendations  Hospital bed;Wheelchair (measurements PT);Wheelchair cushion (measurements PT);BSC/3in1 (custom tilt in space, high back wheelechair)    Recommendations for Other Services       Precautions / Restrictions Precautions Precautions: Fall;Cervical Precaution Comments: C6 laminectomy, R radial A-line, MAP >80 Restrictions Weight Bearing Restrictions: No     Mobility  Bed Mobility Overal bed mobility: Needs Assistance Bed Mobility: Supine to Sit, Sit to Supine     Supine to sit: Total assist, +2 for physical assistance Sit to supine: Total assist, +2 for physical assistance   General bed mobility comments: MAP monitored throughout session and remained in the 90s with activity. no dizziness is reported with upright activity. extensive assistance required for trunk and BLE support. cues for logroll technique    Transfers                   General transfer comment: not attempted    Ambulation/Gait                   Stairs             Wheelchair Mobility    Modified Rankin (Stroke Patients Only)       Balance Overall balance assessment: Needs assistance Sitting-balance support: Feet supported, Single extremity supported (faciliation for LUE support (unable to attempt using RUE due to A-line)) Sitting balance-Leahy Scale: Zero Sitting balance - Comments: Mod A with periods of Max  A required to maintain midline sitting balance. focus on maintaining midline sitting balance, deep breathing  techniques, and coughing while seated. patient sat for ~ 7-8 minutes Postural control:  (loss of balance in all directions)                                  Cognition Arousal/Alertness: Awake/alert Behavior During Therapy: Flat affect Overall Cognitive Status: Within Functional Limits for tasks assessed                                 General Comments: patient is able to follow single step commands consistently        Exercises Other Exercises Other Exercises: after LE faciliation/PROM, only noted trace left hip adduction felt with palpation. otherwise, no active movement in BLE.  sensation to light touch appears to be improving in right anterior thigh and near the left hip area.  otherwise, no active movement in BLE    General Comments General comments (skin integrity, edema, etc.): encourge a turning schedule for skin integrity. also suggested Prevlon boot for heel skin integrity and nursing was able to obtain. vitals stable throughout session      Pertinent Vitals/Pain Pain Assessment Pain Assessment: Faces Faces Pain Scale: Hurts little more Pain Location: L shoulder Pain Descriptors / Indicators:  (chronic) Pain Intervention(s): Limited activity within patient's tolerance, Monitored during session, Repositioned    Home Living                          Prior Function            PT Goals (current goals can now be found in the care plan section) Acute Rehab PT Goals Patient Stated Goal: to get as much help as possible and get to rehab PT Goal Formulation: With patient Time For Goal Achievement: 10/24/21 Potential to Achieve Goals: Fair Progress towards PT goals: Progressing toward goals    Frequency    Min 3X/week      PT Plan Current plan remains appropriate    Co-evaluation   Reason for Co-Treatment: Complexity of the patient's impairments (multi-system involvement);For patient/therapist safety;To address  functional/ADL transfers PT goals addressed during session: Mobility/safety with mobility        AM-PAC PT "6 Clicks" Mobility   Outcome Measure  Help needed turning from your back to your side while in a flat bed without using bedrails?: Total Help needed moving from lying on your back to sitting on the side of a flat bed without using bedrails?: Total Help needed moving to and from a bed to a chair (including a wheelchair)?: Total Help needed standing up from a chair using your arms (e.g., wheelchair or bedside chair)?: Total Help needed to walk in hospital room?: Total Help needed climbing 3-5 steps with a railing? : Total 6 Click Score: 6    End of Session Equipment Utilized During Treatment: Oxygen Activity Tolerance: Patient tolerated treatment well Patient left: in bed;with call bell/phone within reach Nurse Communication: Mobility status PT Visit Diagnosis: Muscle weakness (generalized) (M62.81);Other abnormalities of gait and mobility (R26.89);Other symptoms and signs involving the nervous system (K16.010)     Time: 9323-5573 PT Time Calculation (min) (ACUTE ONLY): 36 min  Charges:  $Therapeutic Activity: 8-22 mins  Minna Merritts, PT, MPT   Percell Locus 10/11/2021, 3:18 PM

## 2021-10-11 NOTE — Progress Notes (Signed)
Occupational Therapy Treatment Patient Details Name: Jason [name] MRN: [id number] DOB: 1955/06/01 Today's Date: 10/11/2021   History of present illness Patient is a 66 year old male with Metastatic disease involving retroperitoneal and pelvic lymphadenopathy, extensive spine and calcarial metastasis/epidural tumor cord compression disease.  Status post C6 laminectomy for resection of C6 epidural mass. Surgical pathology showed metastatic adenocarcinoma, compatible with prostatic primary. MRI of the brain shows calvarial metastatic disease most notably in the right temporal region and at the vertex, Additional extracranial extension of tumor into the scalp along the vertex, small focus of diffusion restriction in the right parietal cortex, possible acute infarct.   OT comments  Chart reviewed, RN cleared pt for participation in OT tx session. Co tx completed with PT on this date. Pt reports he is motivated to participate in rehab. Pt continues to require TOTAL A +2 for bed mobility adhering to appropriate body mechanics following spinal surgery. Poor static sitting balance on edge of bed with at least MOD A required for sitting balance. Massage provided to L hand with noted edema, positioned at end of tx session elevated with pt educated to move extremity within active range/with assist while in bed. Pt may benefit from B hand splints to facilitate decreased regression of potential contracture in B hands, will continue to assess once aline is removed. At this time, PROM remains intact. Pt will continue to benefit from skilled OT to address functional deficits, provide family education for optimal participation in self care. Pt is left in bed, NAD, all needs met. OT will follow acutely.   Of note: BP in supine 138/78 (MAP 93) BP seated on edge of bed  127/80 (MAP 93) HR 107      Recommendations for follow up therapy are one component of a multi-disciplinary discharge planning process, led by the  attending physician.  Recommendations may be updated based on patient status, additional functional criteria and insurance authorization.    Follow Up Recommendations  Acute inpatient rehab (3hours/day)    Assistance Recommended at Discharge Frequent or constant Supervision/Assistance  Patient can return home with the following  Two people to help with bathing/dressing/bathroom;Two people to help with walking and/or transfers   Equipment Recommendations  BSC/3in1;Tub/shower seat;Wheelchair (measurements OT);Wheelchair cushion (measurements OT);Hospital bed    Recommendations for Other Services      Precautions / Restrictions Precautions Precautions: Fall;Cervical Precaution Comments: C6 laminectomy, R radial A-line, MAP >80 Restrictions Weight Bearing Restrictions: No       Mobility Bed Mobility Overal bed mobility: Needs Assistance Bed Mobility: Supine to Sit, Sit to Supine     Supine to sit: Total assist, +2 for physical assistance Sit to supine: Total assist, +2 for physical assistance   General bed mobility comments: step by step vcs for approrpiate body mechanics    Transfers                         Balance Overall balance assessment: Needs assistance Sitting-balance support: Feet supported, Single extremity supported Sitting balance-Leahy Scale: Zero Sitting balance - Comments: Pt tolerated static sitting at edge of bed for approx 7 minutes. Pt requires at least MOD A for static sitting balance with poor righting reactions throughout. L lateral lean >R lateral lean. Facilitated breathing techniques/cough while seated at edge of bed.  ADL either performed or assessed with clinical judgement   ADL Overall ADL's : Needs assistance/impaired                     Lower Body Dressing: Total assistance Lower Body Dressing Details (indicate cue type and reason): at edge of bed                     Extremity/Trunk Assessment              Vision       Perception     Praxis      Cognition Arousal/Alertness: Awake/alert Behavior During Therapy: Flat affect Overall Cognitive Status: Within Functional Limits for tasks assessed                                          Exercises      Shoulder Instructions       General Comments Educated re: importance of position changes for sking integretiy, use of BUE arms (and R wrist once a line is removed) to facilitate increased movement/ function    Pertinent Vitals/ Pain       Pain Assessment Pain Assessment: Faces Faces Pain Scale: Hurts little more Pain Location: L shoulder Pain Descriptors / Indicators: Grimacing, Discomfort Pain Intervention(s): Limited activity within patient's tolerance, Monitored during session, Repositioned  Home Living                                          Prior Functioning/Environment              Frequency  Min 3X/week        Progress Toward Goals  OT Goals(current goals can now be found in the care plan section)  Progress towards OT goals: Progressing toward goals     Plan Discharge plan remains appropriate;Frequency needs to be updated    Co-evaluation    PT/OT/[name] Co-Evaluation/Treatment: Yes Reason for Co-Treatment: Complexity of the patient's impairments (multi-system involvement);For patient/therapist safety;To address functional/ADL transfers PT goals addressed during session: Mobility/safety with mobility OT goals addressed during session: ADL's and self-care      AM-PAC OT "6 Clicks" Daily Activity     Outcome Measure   Help from another person eating meals?: A Lot Help from another person taking care of personal grooming?: A Lot Help from another person toileting, which includes using toliet, bedpan, or urinal?: Total Help from another person bathing (including washing, rinsing, drying)?: Total Help from another person  to put on and taking off regular upper body clothing?: A Lot Help from another person to put on and taking off regular lower body clothing?: Total 6 Click Score: 9    End of Session    OT Visit Diagnosis: Unsteadiness on feet (R26.81);Muscle weakness (generalized) (M62.81);Other abnormalities of gait and mobility (R26.89);Other symptoms and signs involving the nervous system (R29.898)   Activity Tolerance Patient tolerated treatment well   Patient Left in bed;with call bell/phone within reach;with bed alarm set   Nurse Communication Mobility status        Time: 4010-2725 OT Time Calculation (min): 31 min  Charges: OT General Charges $OT Visit: 1 Visit OT Treatments $Therapeutic Activity: 8-22 mins Shanon Payor, OTD OTR/L  10/11/21, 3:43 PM

## 2021-10-11 NOTE — Progress Notes (Signed)
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 Surgery was on 8/13. Palliative radiation therapy is planned to cervical spine. Neurology is requested to consult regarding MRI finding on 8/15 of small acute ischemic R parietal infarct (TTE 8/15 showed no e/o intracardiac clot but did not include bubble study.   s/p emergent C6 cervical laminectomy with resection of epidural spinal tumor.  He is POD 1 and having some worsening shock despite fluids.  Becoming more confused, lethargic so PCCM consulted.  POST OP RESP FAILURE AND EXTUBATED 8/15 Patient with very weak cough High risk for aspiration     Pertinent  Medical History  HTN Hypothyroidism Allergy GERD  Arthritis   Significant Hospital Events: Including procedures, antibiotic start and stop dates in addition to other pertinent events   8/12 admitted with chronic neck pain and worsening L hand paresthesias, L arm weakness 8/14  Pt transferred to ICU with hypotension along with significant weakness in upper and lower extremities.  Required mechanical intubation for MRI Cervical Spine due to severe claustrophobia  8/14: MR Cervical Spine revealed interval bilateral C6        hemilaminectomies. Similar degree of epidural tumor from          C4-C7 without definite superimposed epidural hematoma or        fluid collection. Progressive spinal canal stenosis from C4-C7,        severe at C5-C6 and C6-C7. No abnormal spinal cord signal or        cord infarct. Similar diffuse cervical  osseous metastatic disease. 8/15: Pt remains mechanically intubated.  Orders placed for MR Brain, Lumbar Spine, and Thoracic Spine to further assess etiology of acute BLE weakness/numbness.  Extubated later in day. 8/18 weak cough, alert and awake, agressive chest PT ordered   Interim History / Subjective:  Pressors weaning down High risk for aspiration, re-intubation CXR pending CHEST PT approved by [facility]  Objective   Blood pressure 124/76, pulse 89, temperature 98.3 F (36.8 C), temperature source Oral, resp. rate (!) 24, height '5\' 9"'$  (1.753 m), weight 91.3 kg, SpO2 93 %.        Intake/Output Summary (Last 24 hours) at 10/11/2021 0739 Last data filed at 10/11/2021 0600 Gross per 24 hour  Intake 208.79 ml  Output 675 ml  Net -466.21 ml    Filed Weights   10/04/21 1656 10/07/21 1645  Weight: 85 kg 91.3 kg    REVIEW OF SYSTEMS +weak cough +SOB All other ROS NEG  PHYSICAL EXAMINATION: GENERAL:critically ill appearing, +resp distress EYES: Pupils equal, round, reactive to light.  No scleral icterus.  MOUTH: Moist mucosal membrane.  NECK: Supple.  PULMONARY: +rhonchi, +wheezing Poor inspiratory effort CARDIOVASCULAR: S1 and S2.  No murmurs  GASTROINTESTINAL: Soft, nontender, -distended. Positive bowel sounds.  MUSCULOSKELETAL: No swelling, clubbing, or edema.  NEUROLOGIC: alert and awake, complete paresis of bilateral lower ext stable SKIN:intact,warm,dry   Examination: No distress    Resolved Hospital Problem list   N/A  Assessment & Plan:   66 yo WM with s/p emergent C6 cervical laminectomy  with resection of epidural spinal tumor. worsening shock despite fluids and post op resp failure now extubation, high risk for aspiration   Severe ACUTE Hypoxic and Hypercapnic Respiratory Failure Extubated Weak cough High risk for aspiration and re-intubation Changed NEB therapy CHEST PT OK by [facility] NTS as needed Obtain CXR  shock SOURCE-spinal related to  extensive metastatic disease, improved/resolving -use vasopressors to keep MAP>65 as needed  ONCOLOGY 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   Follow ONC and RAD ONC    NEUROLOGY Small R parietal stroke-  - Appreciate PT/OT input - Posterior cervical incision management and MAP goals per NSGY, continue levophed for MAP > 80 for now until I hear otherwise - Wean off dexamethasone to promote wound healing - Void trial - Neurology consult - Eventual radiation therapy once surgical bed heals - Prozac and klonipin started for understandable anxiety issues - Graded pain management as ordered    DVT/GI PRX  assessed I Assessed the need for Labs I Assessed the need for Foley I Assessed the need for Central Venous Line Family Discussion when available I Assessed the need for Mobilization I made an Assessment of medications to be adjusted accordingly Safety Risk assessment completed  CASE DISCUSSED IN MULTIDISCIPLINARY ROUNDS WITH ICU TEAM   Critical Care Time devoted to patient care services described in this note is 45 minutes.  Critical care was necessary to treat /prevent imminent and life-threatening deterioration.   May need to consider DNR/DNI STATUS AND PALLIATIVE CONSULTATION FOR GOC  Takeyla Million Patricia Pesa, M.D.  [facility]  Medical Director [place] Director [facility] Cardio-P[facility] Department

## 2021-10-11 NOTE — Consult Note (Signed)
[place] at [facility] [phone] [phone]   Name: Jason [name] Date: 10/11/2021 MRN: [id number]  DOB: 11-04-1955  Patient Care Team: Default, Provider, MD as PCP - General End, Harrell Gave, MD as PCP - Cardiology (Cardiology)    REASON FOR CONSULTATION: Jason [name] is a 66 y.o. male with multiple medical problems including hypertension, hypothyroidism, GERD, and OA status post t left shoulder arthroplasty, who was admitted to the hospital on 10/05/2021 with symptoms of left upper extremity numbness and weakness.  Work-up including MRI was concerning for epidural spinal cord compression stemming from metastatic disease.  CT of the chest, abdomen, and pelvis showed retroperitoneal and pelvic lymphadenopathy.  MRI of the brain showed calvarial metastatic disease with brain encroachment and also possible small acute right parietal infarct.  Patient underwent C6 laminectomy and resection of tumor on 10/06/2021.  Pathology positive for adenocarcinoma the prostate.  His hospital course has been complicated by a prolonged ICU stay initially on the ventilator and then later requiring pressors.  Palliative care was consulted to help address goals.  SOCIAL HISTORY:     reports that he has never smoked. He has never used smokeless tobacco. He reports that he does not currently use alcohol. He reports that he does not use drugs.  Patient is married and lives at home with his wife.  He has no children.  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  ADVANCE DIRECTIVES:  Does not have  CODE STATUS: Full code  PAST MEDICAL HISTORY: Past Medical History:  Diagnosis Date   Allergy    Arthritis    osteoarthritis   GERD (gastroesophageal reflux disease)    Hypertension    Hypothyroidism    Thyroid disease    Hyperthyroidism s/p radioactive iodine ablation    PAST SURGICAL HISTORY:  Past Surgical History:   Procedure Laterality Date   CARDIAC CATHETERIZATION  03/2000   HERNIA REPAIR  3212   umbilical   LEFT HEART CATH AND CORONARY ANGIOGRAPHY Left 03/20/2020   Procedure: LEFT HEART CATH AND CORONARY ANGIOGRAPHY;  Surgeon: Nelva Bush, MD;  Location: [facility];  Service: Cardiovascular;  Laterality: Left;   POSTERIOR CERVICAL FUSION/FORAMINOTOMY N/A 10/06/2021   Procedure: POSTERIOR CERVICAL FUSION/ FORAMINOTOMY LEVEL 3;  Surgeon: Meade Maw, MD;  Location: [facility];  Service: Neurosurgery;  Laterality: N/A;  will need monitoring   RADIOLOGY WITH ANESTHESIA Left 04/18/2021   Procedure: MRI SHOULDER WITHOUT CONTRAST WITH ANESTHESIA;  Surgeon: Radiologist, Medication, MD;  Location: [facility];  Service: Radiology;  Laterality: Left;   RADIOLOGY WITH ANESTHESIA N/A 10/03/2021   Procedure: MRI CERVICAL SPINE WITH ANESTHESIA;  Surgeon: Radiologist, Medication, MD;  Location: [facility];  Service: Radiology;  Laterality: N/A;   REVERSE SHOULDER ARTHROPLASTY Left    ROTATOR CUFF REPAIR  11/2010   Dr [name] Heck   TONSILLECTOMY     TOTAL HIP ARTHROPLASTY Right 02/24/2009   TOTAL HIP ARTHROPLASTY Left 06/2016   Dr Harlow Mares    HEMATOLOGY/ONCOLOGY HISTORY:  Oncology History   No history exists.    ALLERGIES:  has No Known Allergies.  MEDICATIONS:  Current Facility-Administered Medications  Medication Dose Route Frequency Provider Last Rate Last Admin   0.9 %  sodium chloride infusion  250 mL Intravenous Continuous Candee Furbish, MD       acetaminophen (TYLENOL) tablet 650 mg  650 mg Oral Q6H PRN Meade Maw, MD   650 mg at 10/11/21 1156   Or  acetaminophen (TYLENOL) suppository 650 mg  650 mg Rectal Q6H PRN Meade Maw, MD       ascorbic acid (VITAMIN C) tablet 500 mg  500 mg Oral Daily Meade Maw, MD   500 mg at 10/11/21 1105   budesonide (PULMICORT) nebulizer solution 0.5 mg  0.5 mg Nebulization BID Flora Lipps, MD   0.5 mg at 10/11/21 0826   Chlorhexidine  Gluconate Cloth 2 % PADS 6 each  6 each Topical Q0600 Candee Furbish, MD   6 each at 10/11/21 1200   clonazePAM (KLONOPIN) tablet 0.5 mg  0.5 mg Oral BID Candee Furbish, MD   0.5 mg at 10/11/21 1104   dextromethorphan-guaiFENesin ([place] DM) 30-600 MG per 12 hr tablet 1 tablet  1 tablet Oral BID Meade Maw, MD   1 tablet at 10/11/21 1105   docusate sodium (COLACE) capsule 100 mg  100 mg Oral BID Benita Gutter, RPH   100 mg at 10/11/21 1107   enoxaparin (LOVENOX) injection 40 mg  40 mg Subcutaneous Q24H Candee Furbish, MD   40 mg at 10/10/21 2230   famotidine (PEPCID) tablet 20 mg  20 mg Oral Daily Benita Gutter, RPH   20 mg at 10/11/21 1105   feeding supplement (ENSURE ENLIVE / ENSURE PLUS) liquid 237 mL  237 mL Oral TID BM Candee Furbish, MD   237 mL at 10/10/21 0809   FLUoxetine (PROZAC) capsule 20 mg  20 mg Oral QHS Candee Furbish, MD   20 mg at 10/10/21 2231   hydrALAZINE (APRESOLINE) injection 5 mg  5 mg Intravenous Q6H PRN Emeterio Reeve, DO       insulin aspart (novoLOG) injection 0-5 Units  0-5 Units Subcutaneous QHS Teressa Lower, NP       insulin aspart (novoLOG) injection 0-9 Units  0-9 Units Subcutaneous TID WC Teressa Lower, NP   2 Units at 10/10/21 1514   ipratropium-albuterol (DUONEB) 0.5-2.5 (3) MG/3ML nebulizer solution 3 mL  3 mL Nebulization Q4H Flora Lipps, MD   3 mL at 10/11/21 1533   levothyroxine (SYNTHROID) tablet 88 mcg  88 mcg Oral Q0600 Meade Maw, MD   88 mcg at 10/11/21 1108   loratadine (CLARITIN) tablet 10 mg  10 mg Oral BID Meade Maw, MD   10 mg at 10/11/21 1109   magnesium hydroxide (MILK OF MAGNESIA) suspension 30 mL  30 mL Oral Daily PRN Meade Maw, MD   30 mL at 10/05/21 0744   melatonin tablet 5 mg  5 mg Oral QHS PRN Meade Maw, MD   5 mg at 10/09/21 2135   meloxicam (MOBIC) tablet 15 mg  15 mg Oral Daily Meade Maw, MD   15 mg at 10/10/21 2230   multivitamin with minerals tablet 1 tablet  1  tablet Oral Daily Meade Maw, MD   1 tablet at 10/11/21 1107   ondansetron (ZOFRAN) tablet 4 mg  4 mg Oral Q6H PRN Meade Maw, MD       Or   ondansetron [facility]) injection 4 mg  4 mg Intravenous Q6H PRN Meade Maw, MD       oxyCODONE-acetaminophen (PERCOCET/ROXICET) 5-325 MG per tablet 1-2 tablet  1-2 tablet Oral Q6H PRN Candee Furbish, MD       polyethylene glycol (MIRALAX / GLYCOLAX) packet 17 g  17 g Oral Daily Benita Gutter, RPH   17 g at 10/11/21 1109   potassium chloride (KLOR-CON M) CR tablet 10 mEq  10  mEq Oral QHS Meade Maw, MD   10 mEq at 10/10/21 2231   riluzole (RILUTEK) tablet 50 mg  50 mg Oral Q12H Benita Gutter, RPH   50 mg at 10/11/21 1112   rosuvastatin (CRESTOR) tablet 5 mg  5 mg Oral Daily Meade Maw, MD   5 mg at 10/11/21 1106   sodium chloride (OCEAN) 0.65 % nasal spray 1 spray  1 spray Each Nare PRN Candee Furbish, MD   1 spray at 10/09/21 2206   tamsulosin (FLOMAX) capsule 0.4 mg  0.4 mg Oral Daily Meade Maw, MD   0.4 mg at 10/11/21 1104    VITAL SIGNS: BP 138/78   Pulse (!) 107   Temp 98.3 F (36.8 C) (Oral)   Resp (!) 25   Ht _0  (1.753 m)   Wt 201 lb 4.5 oz (91.3 kg)   SpO2 (!) 88%   BMI 29.72 kg/m  Filed Weights   10/04/21 1656 10/07/21 1645  Weight: 187 lb 6.3 oz (85 kg) 201 lb 4.5 oz (91.3 kg)    Estimated body mass index is 29.72 kg/m as calculated from the following:   Height as of this encounter: _1  (1.753 m).   Weight as of this encounter: 201 lb 4.5 oz (91.3 kg).  LABS: CBC:    Component Value Date/Time   WBC 13.9 (H) 10/11/2021 0500   HGB 9.2 (L) 10/11/2021 0500   HGB 15.2 10/10/2019 1553   HCT 28.2 (L) 10/11/2021 0500   HCT 44.2 10/10/2019 1553   PLT 173 10/11/2021 0500   PLT 284 10/10/2019 1553   MCV 93.4 10/11/2021 0500   MCV 93 10/10/2019 1553   NEUTROABS 7.5 10/05/2021 1302   NEUTROABS 4.1 08/28/2017 0956   LYMPHSABS 1.7 10/05/2021 1302   LYMPHSABS 1.6 08/28/2017  0956   MONOABS 0.8 10/05/2021 1302   EOSABS 0.1 10/05/2021 1302   EOSABS 0.2 08/28/2017 0956   BASOSABS 0.1 10/05/2021 1302   BASOSABS 0.1 08/28/2017 0956   Comprehensive Metabolic Panel:    Component Value Date/Time   NA 136 10/11/2021 0500   NA 140 01/29/2021 0822   K 3.7 10/11/2021 0500   CL 106 10/11/2021 0500   CO2 23 10/11/2021 0500   BUN 32 (H) 10/11/2021 0500   BUN 16 01/29/2021 0822   CREATININE 0.51 (L) 10/11/2021 0500   GLUCOSE 93 10/11/2021 0500   CALCIUM 7.8 (L) 10/11/2021 0500   AST 121 (H) 10/11/2021 0500   ALT 134 (H) 10/11/2021 0500   ALKPHOS 170 (H) 10/11/2021 0500   BILITOT 1.4 (H) 10/11/2021 0500   BILITOT 1.1 01/29/2021 0822   PROT 5.8 (L) 10/11/2021 0500   PROT 7.2 01/29/2021 0822   ALBUMIN 2.8 (L) 10/11/2021 0500   ALBUMIN 4.4 01/29/2021 0822    RADIOGRAPHIC STUDIES: ECHOCARDIOGRAM LIMITED BUBBLE STUDY  Result Date: 10/11/2021    ECHOCARDIOGRAM LIMITED REPORT   Patient Name:   Jason [name] Date of Exam: 10/11/2021 Medical Rec #:  [id number]        Height:       69.0 in Accession #:    [id number]       Weight:       201.3 lb Date of Birth:  November 16, 1955        BSA:          2.072 m Patient Age:    33 years         BP:  140/75 mmHg Patient Gender: M                HR:           103 bpm. Exam Location:  [facility] Procedure: Limited Echo and Saline Contrast Bubble Study Indications:     I63.9 Stroke  History:         Patient has prior history of Echocardiogram examinations, most                  recent 10/08/2021. Risk Factors:Hypertension.  Sonographer:     Charmayne Sheer Referring Phys:  [place] Diagnosing Phys: Ida Rogue MD IMPRESSIONS  1. Left ventricular ejection fraction, by estimation, is 60 to 65%. The left ventricle has normal function. The left ventricle has no regional wall motion abnormalities. Left ventricular diastolic parameters are indeterminate.  2. Right ventricular systolic function is normal. The right ventricular size is  normal  3. Agitated saline contrast bubble study was negative, with no evidence of any interatrial shun  4. Limited study FINDINGS  Left Ventricle: Left ventricular ejection fraction, by estimation, is 60 to 65%. The left ventricle has normal function. The left ventricle has no regional wall motion abnormalities. The left ventricular internal cavity size was normal in size. There is  no left ventricular hypertrophy. Left ventricular diastolic parameters are indeterminate. Right Ventricle: The right ventricular size is normal. No increase in right ventricular wall thickness. Right ventricular systolic function is normal. Left Atrium: Left atrial size was normal in size. Right Atrium: Right atrial size was normal in size. Pericardium: There is no evidence of pericardial effusion. Mitral Valve: The mitral valve was not well visualized. No evidence of mitral valve stenosis. Tricuspid Valve: The tricuspid valve is not well visualized. Tricuspid valve regurgitation is not demonstrated. No evidence of tricuspid stenosis. Aortic Valve: The aortic valve was not well visualized. Aortic valve regurgitation is not visualized. No aortic stenosis is present. Pulmonic Valve: The pulmonic valve was not well visualized. Pulmonic valve regurgitation is not visualized. No evidence of pulmonic stenosis. Aorta: The aortic root was not well visualized. Venous: The pulmonary veins were not well visualized. The inferior vena cava was not well visualized. IAS/Shunts: No atrial level shunt detected by color flow Doppler. Agitated saline contrast was given intravenously to evaluate for intracardiac shunting. Agitated saline contrast bubble study was negative, with no evidence of any interatrial shunt. Ida Rogue MD Electronically signed by Ida Rogue MD Signature Date/Time: 10/11/2021/11:41:14 AM    Final    DG Chest Port 1 View  Result Date: 10/11/2021 CLINICAL DATA:  Respiratory failure.  Metastatic prostate cancer. EXAM:  PORTABLE CHEST 1 VIEW COMPARISON:  Chest x-ray dated 10/09/2021. FINDINGS: Cardiomediastinal silhouette is stable in size and configuration. LEFT-sided PICC line is stable in position with tip at the level of the lower SVC/cavoatrial junction. Study is hypoinspiratory with crowding of the bibasilar markings, RIGHT greater than LEFT. Lungs appear clear. No pleural effusion or pneumothorax is seen. IMPRESSION: Low lung volumes. No evidence of pneumonia or pulmonary edema. Electronically Signed   By: Franki Cabot M.D.   On: 10/11/2021 08:09   CT ANGIO HEAD NECK W WO CM  Result Date: 10/10/2021 CLINICAL DATA:  Left hand paresthesia.  Metastatic prostate cancer. EXAM: CT ANGIOGRAPHY HEAD AND NECK TECHNIQUE: Multidetector CT imaging of the head and neck was performed using the standard protocol during bolus administration of intravenous contrast. Multiplanar CT image reconstructions and MIPs were obtained to evaluate the vascular anatomy.  Carotid stenosis measurements (when applicable) are obtained utilizing NASCET criteria, using the distal internal carotid diameter as the denominator. RADIATION DOSE REDUCTION: This exam was performed according to the departmental dose-optimization program which includes automated exposure control, adjustment of the mA and/or kV according to patient size and/or use of iterative reconstruction technique. CONTRAST:  71m OMNIPAQUE IOHEXOL 350 MG/ML SOLN COMPARISON:  None Available. FINDINGS: CT HEAD FINDINGS Brain: There is no mass, hemorrhage or extra-axial collection. The size and configuration of the ventricles and extra-axial CSF spaces are normal. There is no acute or chronic infarction. The brain parenchyma is normal. Skull: The visualized skull base, calvarium and extracranial soft tissues are normal. Sinuses/Orbits: No fluid levels or advanced mucosal thickening of the visualized paranasal sinuses. No mastoid or middle ear effusion. The orbits are normal. CTA NECK FINDINGS  SKELETON: Numerous sclerotic lesions throughout the cervical spine. OTHER NECK: Normal pharynx, larynx and major salivary glands. No cervical lymphadenopathy. Unremarkable thyroid gland. UPPER CHEST: Right nodal lobe atelectasis and hazy opacities in the right upper lobe. AORTIC ARCH: There is calcific atherosclerosis of the aortic arch. There is no aneurysm, dissection or hemodynamically significant stenosis of the visualized portion of the aorta. Conventional 3 vessel aortic branching pattern. The visualized proximal subclavian arteries are widely patent. RIGHT CAROTID SYSTEM: Normal without aneurysm, dissection or stenosis. LEFT CAROTID SYSTEM: Normal without aneurysm, dissection or stenosis. VERTEBRAL ARTERIES: Right dominant configuration. Both origins are clearly patent. There is no dissection, occlusion or flow-limiting stenosis to the skull base (V1-V3 segments). CTA HEAD FINDINGS POSTERIOR CIRCULATION: --Vertebral arteries: Normal V4 segments. --Inferior cerebellar arteries: Normal. --Basilar artery: Normal. --Superior cerebellar arteries: Normal. --Posterior cerebral arteries (PCA): Normal. ANTERIOR CIRCULATION: --Intracranial internal carotid arteries: Normal. --Anterior cerebral arteries (ACA): Normal. Both A1 segments are present. Patent anterior communicating artery (a-comm). --Middle cerebral arteries (MCA): Normal. VENOUS SINUSES: As permitted by contrast timing, patent. ANATOMIC VARIANTS: None Review of the MIP images confirms the above findings. Review of the MIP images confirms the above findings IMPRESSION: 1. No emergent large vessel occlusion, hemodynamically significant stenosis, aneurysm or dissection of the major cervical or intracranial arteries. 2. Numerous sclerotic lesions throughout the cervical spine, consistent with osseous metastatic disease. 3. Right middle lobe atelectasis and hazy opacities in the right upper lobe, possibly infection or asymmetric pulmonary edema. 4. Aortic  Atherosclerosis (ICD10-I70.0). Electronically Signed   By: KUlyses JarredM.D.   On: 10/10/2021 21:55   DG Chest Port 1 View  Result Date: 10/09/2021 CLINICAL DATA:  Respiratory failure EXAM: PORTABLE CHEST 1 VIEW COMPARISON:  Film from the previous day. FINDINGS: Cardiac shadow is stable. Endotracheal tube and gastric catheter have been removed in the interval. Left jugular central line is again seen. Lungs are clear. No bony abnormality is noted. IMPRESSION: No acute abnormality noted. Electronically Signed   By: MInez CatalinaM.D.   On: 10/09/2021 21:41   MR BRAIN W WO CONTRAST  Result Date: 10/08/2021 CLINICAL DATA:  Extensive metastatic disease with confusion and lethargy. EXAM: MRI HEAD WITHOUT AND WITH CONTRAST TECHNIQUE: Multiplanar, multiecho pulse sequences of the brain and surrounding structures were obtained without and with intravenous contrast. CONTRAST:  967mGADAVIST GADOBUTROL 1 MMOL/ML IV SOLN COMPARISON:  None Available. FINDINGS: Brain: There is a small focus of diffusion restriction in the right parietal cortex with faint corresponding FLAIR signal abnormality but no abnormal enhancement most in keeping with a small acute infarct. There is no associated hemorrhage or mass effect. There is no other evidence of acute infarct.  There is no acute intracranial hemorrhage or extra-axial fluid collection. Background parenchymal volume is normal. The ventricles are normal in size. Gray-white differentiation is preserved. Scattered small foci of FLAIR signal abnormality in the subcortical and periventricular white matter are nonspecific but likely reflects sequela of mild chronic white matter microangiopathy There is no abnormal parenchymal enhancement there is no solid parenchymal lesion. There is calvarial signal abnormality bilaterally likely reflecting osseous metastatic disease. There is intracranial enhancement along the right frontal convexity likely reflecting extraosseous tumor measuring up  to 5 mm in thickness. There is additional thin pachymeningeal thickening and enhancement over the right cerebral convexity suspicious for additional tumor extension. There is extracranial extension of tumor into the soft tissues of the overlying scalp measuring up to 3 mm in thickness. Additional extracranial tumor is seen along the scalp at the vertex measuring up to 7 mm in thickness. There is no mass effect on the underlying brain parenchyma due to the intracranial tumor. There is no midline shift. Vascular: Normal flow voids. Skull and upper cervical spine: Osseous metastatic disease is seen in the calvarium with extraosseous extension as described above. Sinuses/Orbits: There is extensive fluid in the paranasal sinuses likely related to instrumentation. The globes and orbits are unremarkable. Other: None. IMPRESSION: 1. 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 There is intracranial extension of tumor along the right frontal convexity measuring up to 5 mm in thickness without mass effect on the underlying brain parenchyma or midline shift. Extracranial extension of tumor in this location measures up to approximately 3 mm. 2. Additional extracranial extension of tumor into the scalp along the vertex measuring up to 7 mm in thickness. 3. Small focus of diffusion restriction in the right parietal cortex most in keeping with a small acute infarct. 4. No evidence of parenchymal metastatic disease. Electronically Signed   By: Valetta Mole M.D.   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 Postop day 1 with worsening shock, confusion, lethargy. EXAM: MRI THORACIC AND LUMBAR SPINE WITHOUT AND WITH CONTRAST TECHNIQUE:  Multiplanar and multiecho pulse sequences of the thoracic and lumbar spine were obtained without and with intravenous contrast. CONTRAST:  74m GADAVIST GADOBUTROL 1 MMOL/ML IV SOLN COMPARISON:  Cervical spine MRI dated 1 day prior, CT chest/abdomen/pelvis 10/05/2021 FINDINGS: MRI THORACIC SPINE FINDINGS A single sagittal T2 sequence through the C-spine was obtained. Postsurgical changes reflecting C6 laminectomy are noted. The spinal canal patency is unchanged compared to the postoperative study from 1 day prior. There is no definite cord signal abnormality. There is prevertebral edema which may be postsurgical in nature. Alignment: There is exaggerated thoracic kyphosis. There is no antero or retrolisthesis. Vertebrae: There is extensive signal abnormality throughout the thoracic spine consistent with extensive osseous metastatic disease. There is no evidence of pathologic fracture. There is breakthrough of the posterior cortex with epidural tumor along the posterior endplates at T9, TD17 and T12 without no significant spinal canal stenosis or evidence of cord compression. Circumferential enhancement is also seen in the spinal canal at the cervicothoracic junction extending superiorly off the field of view and inferiorly to the T3-T4 level likely reflecting epidural tumor also without high-grade spinal canal stenosis or cord compression (for example 27-2, 27-6). Cord: Normal in  signal and morphology. There is no abnormal cord enhancement. There is scallop shaped T1 isointense material in the dorsal canal along the cord beginning at the T9-T10 level extending inferiorly into the lumbar spine which anteriorly displaces the cord and cauda equina nerve roots, likely subdural in location of uncertain etiology. The collection measures up to 7 mm in maximal thickness in the axial plane (see image 24-33, 20-9). There is no peripheral enhancement to suggest abscess. Paraspinal and other soft tissues: There is consolidation  in the lower lobes, right worse than left, new since the prior CT from 08/12. The paraspinal soft tissues are unremarkable. Disc levels: There is overall mild background degenerative change throughout the thoracic spine. There is no significant disc herniation or spinal canal or neural foraminal stenosis. MRI LUMBAR SPINE FINDINGS Segmentation: Standard; the lowest formed disc space is designated L5-S1. Alignment:  There is no significant antero or retrolisthesis. Vertebrae: There is extensive signal abnormality throughout the lumbar spine and imaged pelvis consistent with extensive osseous metastatic disease, most notably at L1 and L2. There is epidural extension of tumor most notably at L1 where it measures up to 5 mm in thickness and at L2 particularly in the region of the right subarticular zone and along the right lamina (7-14); however, circumferential enhancement extends inferiorly throughout the lumbar spine into the sacrum likely reflecting additional epidural tumor. The epidural tumor at L1 results in mild canal narrowing without cord/conus compression. The epidural tumor lower in the lumbar spine superimposed on pre-existing degenerative changes contribute to multilevel spinal canal stenosis as described in detail below. There is no evidence of pathologic fracture. Conus medullaris: Extends to the T12-L1 level. There is crowding of the cauda equina nerve roots due to the above-described presumed subdural fluid collection. There is no abnormal enhancement of the cauda equina nerve roots. Paraspinal and other soft tissues: The paraspinal soft tissues are unremarkable. Disc levels: T12-L1: No significant spinal canal or neural foraminal stenosis L1-L2: Disc degeneration without significant spinal canal or neural foraminal stenosis L2-L3: Disc degeneration and bilateral facet arthropathy result in mild bilateral subarticular zone narrowing without significant neural foraminal stenosis. L3-L4: There is diffuse  disc bulge and moderate bilateral facet arthropathy with ligamentum flavum thickening as well as probable epidural tumor resulting in severe spinal canal stenosis with cauda equina nerve root compression and moderate left and mild right neural foraminal stenosis L4-L5: There is diffuse disc bulge and moderate bilateral facet arthropathy as well as probable epidural tumor most notably along the dorsal canal resulting in severe spinal canal stenosis with cauda equina nerve root compression and moderate left and mild right neural foraminal stenosis L5-S1: There is a diffuse disc bulge and bilateral facet arthropathy as well as epidural tumor along the dorsal and ventral canal resulting in moderate spinal canal stenosis with bilateral subarticular zone narrowing and moderate left worse than right neural foraminal stenosis. IMPRESSION: 1. 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 2. 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 3. Additional epidural tumor along the posterior endplates at T9, K44, and T12 without high-grade spinal canal stenosis or cord compression. 4. Extensive epidural tumor in the lumbar spine is greatest in thickness at L1 without high-grade spinal canal stenosis at this level; however, superimposed on pre-existing degenerative changes lower in the lumbar spine results in severe spinal canal stenosis with cauda equina  nerve root compression at L3-L4 and L4-L5. Multilevel neural foraminal stenosis is detailed above. 5. Postsurgical changes reflecting C6 laminectomy unchanged spinal canal stenosis compared to the postoperative study from 1 day prior. No definite cord signal abnormality. 6. Extramedullary fluid collection along the dorsal aspect of the cord beginning at T9-T10 extending into the lumbar  spine is likely subdural in location but is of uncertain etiology; evolving blood products not excluded. The collection anteriorly displaces the cord and cauda equina nerve roots without frank cord compression or signal abnormality. There is no peripheral enhancement to suggest abscess. 7. Prevertebral edema in the cervical spine which may be postsurgical in nature. 8. New bilateral lower lobe consolidations may reflect atelectasis or developing pneumonia/aspiration. Electronically Signed   By: Valetta Mole M.D.   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 Postop day 1 with worsening shock, confusion, lethargy. EXAM: MRI THORACIC AND LUMBAR SPINE WITHOUT AND WITH CONTRAST TECHNIQUE: Multiplanar and multiecho pulse sequences of the thoracic and lumbar spine were obtained without and with intravenous contrast. CONTRAST:  84m GADAVIST GADOBUTROL 1 MMOL/ML IV SOLN COMPARISON:  Cervical spine MRI dated 1 day prior, CT chest/abdomen/pelvis 10/05/2021 FINDINGS: MRI THORACIC SPINE FINDINGS A single sagittal T2 sequence through the C-spine was obtained. Postsurgical changes reflecting C6 laminectomy are noted. The spinal canal patency is unchanged compared to the postoperative study from 1 day prior. There is no definite cord signal abnormality. There is prevertebral edema which may be postsurgical in nature. Alignment: There is exaggerated thoracic kyphosis. There is no antero or retrolisthesis. Vertebrae: There is extensive signal abnormality throughout the thoracic spine consistent with extensive osseous metastatic disease. There is no evidence of pathologic fracture. There is breakthrough of the posterior cortex with epidural tumor along the posterior endplates at T9, TU31  and T12 without no significant spinal canal stenosis or evidence of cord compression. Circumferential enhancement is also seen in the spinal canal at the cervicothoracic junction extending superiorly off the field of view and inferiorly to the T3-T4 level likely reflecting epidural tumor also without high-grade spinal canal stenosis or cord compression (for example 27-2, 27-6). Cord: Normal in signal and morphology. There is no abnormal cord enhancement. There is scallop shaped T1 isointense material in the dorsal canal along the cord beginning at the T9-T10 level extending inferiorly into the lumbar spine which anteriorly displaces the cord and cauda equina nerve roots, likely subdural in location of uncertain etiology. The collection measures up to 7 mm in maximal thickness in the axial plane (see image 24-33, 20-9). There is no peripheral enhancement to suggest abscess. Paraspinal and other soft tissues: There is consolidation in the lower lobes, right worse than left, new since the prior CT from 08/12. The paraspinal soft tissues are unremarkable. Disc levels: There is overall mild background degenerative change throughout the thoracic spine. There is no significant disc herniation or spinal canal or neural foraminal stenosis. MRI LUMBAR SPINE FINDINGS Segmentation: Standard; the lowest formed disc space is designated L5-S1. Alignment:  There is no significant antero or retrolisthesis. Vertebrae: There is extensive signal abnormality throughout the lumbar spine and imaged pelvis consistent with extensive osseous metastatic disease, most notably at L1 and L2. There is epidural extension of tumor most notably at L1 where it measures up to  5 mm in thickness and at L2 particularly in the region of the right subarticular zone and along the right lamina (7-14); however, circumferential enhancement extends inferiorly throughout the lumbar spine into the sacrum likely reflecting additional epidural tumor. The epidural  tumor at L1 results in mild canal narrowing without cord/conus compression. The epidural tumor lower in the lumbar spine superimposed on pre-existing degenerative changes contribute to multilevel spinal canal stenosis as described in detail below. There is no evidence of pathologic fracture. Conus medullaris: Extends to the T12-L1 level. There is crowding of the cauda equina nerve roots due to the above-described presumed subdural fluid collection. There is no abnormal enhancement of the cauda equina nerve roots. Paraspinal and other soft tissues: The paraspinal soft tissues are unremarkable. Disc levels: T12-L1: No significant spinal canal or neural foraminal stenosis L1-L2: Disc degeneration without significant spinal canal or neural foraminal stenosis L2-L3: Disc degeneration and bilateral facet arthropathy result in mild bilateral subarticular zone narrowing without significant neural foraminal stenosis. L3-L4: There is diffuse disc bulge and moderate bilateral facet arthropathy with ligamentum flavum thickening as well as probable epidural tumor resulting in severe spinal canal stenosis with cauda equina nerve root compression and moderate left and mild right neural foraminal stenosis L4-L5: There is diffuse disc bulge and moderate bilateral facet arthropathy as well as probable epidural tumor most notably along the dorsal canal resulting in severe spinal canal stenosis with cauda equina nerve root compression and moderate left and mild right neural foraminal stenosis L5-S1: There is a diffuse disc bulge and bilateral facet arthropathy as well as epidural tumor along the dorsal and ventral canal resulting in moderate spinal canal stenosis with bilateral subarticular zone narrowing and moderate left worse than right neural foraminal stenosis. IMPRESSION: 1. 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 2. Thin circumferential epidural tumor at the cervicothoracic junction  extending superiorly off the field of view and inferiorly to the T3-T4 level without high-grade spinal canal stenosis or cord compression. 3. Additional epidural tumor along the posterior endplates at T9, G01, and T12 without high-grade spinal canal stenosis or cord compression. 4. Extensive epidural tumor in the lumbar spine is greatest in thickness at L1 without high-grade spinal canal stenosis at this level; however, superimposed on pre-existing degenerative changes lower in the lumbar spine results in severe spinal canal stenosis with cauda equina nerve root compression at L3-L4 and L4-L5. Multilevel neural foraminal stenosis is detailed above. 5. Postsurgical changes reflecting C6 laminectomy unchanged spinal canal stenosis compared to the postoperative study from 1 day prior. No definite cord signal abnormality. 6. Extramedullary fluid collection along the dorsal aspect of the cord beginning at T9-T10 extending into the lumbar spine is likely subdural in location but is of uncertain etiology; evolving blood products not excluded. The collection anteriorly displaces the cord and cauda equina nerve roots without frank cord compression or signal abnormality. There is no peripheral enhancement to suggest abscess. 7. Prevertebral edema in the cervical spine which may be postsurgical in nature. 8. New bilateral lower lobe consolidations may reflect atelectasis or developing pneumonia/aspiration. Electronically Signed   By: Valetta Mole M.D.   On: 10/08/2021 13:18   ECHOCARDIOGRAM COMPLETE  Result Date: 10/08/2021    ECHOCARDIOGRAM REPORT   Patient Name:   Jason [name] Date of Exam: 10/08/2021 Medical Rec #:  [id number]        Height:       69.0 in Accession #:    [id number]  Weight:       201.3 lb Date of Birth:  1955-02-27        BSA:          2.072 m Patient Age:    84 years         BP:           116/51 mmHg Patient Gender: M                HR:           56 bpm. Exam Location:  [facility] Procedure: 2D Echo,  Cardiac Doppler and Color Doppler Indications:     Other abnormalities of the heart 00.8  History:         Patient has prior history of Echocardiogram examinations, most                  recent 07/19/2020. Risk Factors:Hypertension. GERD.  Sonographer:     Sherrie Sport Referring Phys:  [id number] Emeterio Reeve Diagnosing Phys: Isaias Cowman MD  Sonographer Comments: Technically challenging study due to limited acoustic windows, echo performed with patient supine and on artificial respirator, no subcostal window and no parasternal window. IMPRESSIONS  1. Left ventricular ejection fraction, by estimation, is 55 to 60%. The left ventricle has normal function. The left ventricle has no regional wall motion abnormalities. Left ventricular diastolic parameters were normal.  2. Right ventricular systolic function is normal. The right ventricular size is normal.  3. The mitral valve is normal in structure. Mild mitral valve regurgitation. No evidence of mitral stenosis.  4. The aortic valve is normal in structure. Aortic valve regurgitation is not visualized. Mild aortic valve stenosis.  5. The inferior vena cava is normal in size with greater than 50% respiratory variability, suggesting right atrial pressure of 3 mmHg. FINDINGS  Left Ventricle: Left ventricular ejection fraction, by estimation, is 55 to 60%. The left ventricle has normal function. The left ventricle has no regional wall motion abnormalities. The left ventricular internal cavity size was normal in size. There is  no left ventricular hypertrophy. Left ventricular diastolic parameters were normal. Right Ventricle: The right ventricular size is normal. No increase in right ventricular wall thickness. Right ventricular systolic function is normal. Left Atrium: Left atrial size was normal in size. Right Atrium: Right atrial size was normal in size. Pericardium: There is no evidence of pericardial effusion. Mitral Valve: The mitral valve is normal in  structure. Mild mitral valve regurgitation. No evidence of mitral valve stenosis. Tricuspid Valve: The tricuspid valve is normal in structure. Tricuspid valve regurgitation is mild . No evidence of tricuspid stenosis. Aortic Valve: The aortic valve is normal in structure. Aortic valve regurgitation is not visualized. Mild aortic stenosis is present. Aortic valve mean gradient measures 7.0 mmHg. Aortic valve peak gradient measures 11.6 mmHg. Aortic valve area, by VTI measures 2.09 cm. Pulmonic Valve: The pulmonic valve was normal in structure. Pulmonic valve regurgitation is not visualized. No evidence of pulmonic stenosis. Aorta: The aortic root is normal in size and structure. Venous: The inferior vena cava is normal in size with greater than 50% respiratory variability, suggesting right atrial pressure of 3 mmHg. IAS/Shunts: No atrial level shunt detected by color flow Doppler.  LEFT VENTRICLE PLAX 2D LVIDd:         3.90 cm   Diastology LVIDs:         2.80 cm   LV e' medial:    6.85 cm/s LV PW:  1.00 cm   LV E/e' medial:  15.6 LV IVS:        1.40 cm   LV e' lateral:   12.70 cm/s LVOT diam:     2.10 cm   LV E/e' lateral: 8.4 LV SV:         65 LV SV Index:   31 LVOT Area:     3.46 cm  RIGHT VENTRICLE RV Basal diam:  3.70 cm RV S prime:     16.80 cm/s TAPSE (M-mode): 2.1 cm LEFT ATRIUM             Index        RIGHT ATRIUM           Index LA diam:        4.50 cm 2.17 cm/m   RA Area:     19.50 cm LA Vol (A2C):   62.1 ml 29.98 ml/m  RA Volume:   51.40 ml  24.81 ml/m LA Vol (A4C):   73.2 ml 35.33 ml/m LA Biplane Vol: 70.9 ml 34.22 ml/m  AORTIC VALVE AV Area (Vmax):    1.75 cm AV Area (Vmean):   1.87 cm AV Area (VTI):     2.09 cm AV Vmax:           170.33 cm/s AV Vmean:          115.967 cm/s AV VTI:            0.312 m AV Peak Grad:      11.6 mmHg AV Mean Grad:      7.0 mmHg LVOT Vmax:         86.20 cm/s LVOT Vmean:        62.500 cm/s LVOT VTI:          0.188 m LVOT/AV VTI ratio: 0.60 MITRAL VALVE                 TRICUSPID VALVE MV Area (PHT): 3.15 cm     TR Peak grad:   12.4 mmHg MV Decel Time: 241 msec     TR Vmax:        176.00 cm/s MV E velocity: 107.00 cm/s MV A velocity: 102.00 cm/s  SHUNTS MV E/A ratio:  1.05         Systemic VTI:  0.19 m                             Systemic Diam: 2.10 cm Isaias Cowman MD Electronically signed by Isaias Cowman MD Signature Date/Time: 10/08/2021/1:07:52 PM    Final    DG Chest Port 1 View  Result Date: 10/08/2021 CLINICAL DATA:  Difficulty breathing EXAM: PORTABLE CHEST 1 VIEW COMPARISON:  Previous studies including the examination of 10/07/2021 FINDINGS: Cardiac size is within normal limits. Thoracic aorta is tortuous and ectatic. Tip of endotracheal tube is 4.5 cm above the carina. Enteric tube is noted traversing the esophagus with distal portion of the tube in the stomach. Tip of left IJ central venous catheter is seen in right atrium. There are no signs of pulmonary edema. There is poor inspiration. No new focal pulmonary consolidation is seen. There is improvement in aeration of lower lung fields. There is no significant pleural effusion or pneumothorax. Left shoulder arthroplasty is noted. IMPRESSION: There are no signs of pulmonary edema or focal pulmonary consolidation. There is improvement in aeration of lower lung fields suggesting resolving subsegmental atelectasis. Electronically Signed   By: Royston Cowper  Rathinasamy M.D.   On: 10/08/2021 08:28   DG Abd 1 View  Result Date: 10/07/2021 CLINICAL DATA:  Nasogastric tube placement. EXAM: ABDOMEN - 1 VIEW COMPARISON:  October 07, 2021 (6:31 p.m.) FINDINGS: A nasogastric tube is seen with its distal tip seen within the body of the stomach. Properly positioned endotracheal tube and left internal jugular venous catheter are also seen. The bowel gas pattern within the upper abdomen is normal. The remainder of the abdomen and pelvis are not included in the field of view and are limited in evaluation.  IMPRESSION: Nasogastric tube positioning, as described above. Electronically Signed   By: Virgina Norfolk M.D.   On: 10/07/2021 21:45   MR CERVICAL SPINE WO CONTRAST  Result Date: 10/07/2021 CLINICAL DATA:  Unable to move legs. Posterior cervical decompression yesterday for metastatic disease. EXAM: MRI CERVICAL SPINE WITHOUT CONTRAST TECHNIQUE: Multiplanar, multisequence MR imaging of the cervical spine was performed. No intravenous contrast was administered. COMPARISON:  CT cervical spine dated October 05, 2021. MRI cervical spine dated October 03, 2021. FINDINGS: Alignment: New straightening of the normal cervical lordosis. No significant listhesis. Vertebrae: Similar abnormal marrow signal involving the C4 posterior elements and C5 through T2 vertebral bodies and posterior elements. Cord: No abnormal signal. Dorsal epidural tumor at C4 is unchanged. Circumferential epidural tumor from C5-C7 appears similar. No definite superimposed epidural fluid collection. Posterior Fossa, vertebral arteries, paraspinal tissues: Postsurgical changes in the paraspinous soft tissues. No fluid collection. Mild prevertebral soft tissue swelling. Disc levels: C2-C3: Negative disc. Unchanged moderate left and mild right facet arthropathy. No stenosis. C3-C4: Unchanged small posterior disc osteophyte complex, mild bilateral uncovertebral hypertrophy, and moderate left and mild right facet arthropathy. Unchanged mild spinal canal stenosis. Unchanged moderate left neuroforaminal stenosis. No right neuroforaminal stenosis. C4-C5: Unchanged mild disc bulging and moderate right facet uncovertebral hypertrophy. Similar dorsal epidural tumor. New moderate spinal canal stenosis. Unchanged mild bilateral neuroforaminal stenosis. C5-C6: Interval bilateral C6 hemilaminectomies. Unchanged bulky posterior disc osteophyte complex and severe bilateral uncovertebral hypertrophy. Similar circumferential epidural tumor extending into both neural  foramina. Progressive severe spinal canal stenosis. Unchanged biforaminal effacement. C6-C7: Unchanged small posterior disc osteophyte complex and severe bilateral uncovertebral hypertrophy. Similar circumferential epidural tumor extending into both neural foramina. Progressive severe spinal canal stenosis. Unchanged biforaminal effacement. C7-T1: Negative disc. Unchanged partial effacement of the left neural foramen due to tumor. No spinal canal stenosis. IMPRESSION: 1. Interval bilateral C6 hemilaminectomies. Similar degree of epidural tumor from C4-C7 without definite superimposed epidural hematoma or fluid collection. 2. Progressive spinal canal stenosis from C4-C7, severe at C5-C6 and C6-C7. No abnormal spinal cord signal or cord infarct. 3. Similar diffuse cervical osseous metastatic disease. These results were called by telephone at the time of interpretation on 10/07/2021 at 8:05 pm to provider [place] , who verbally acknowledged these results. Electronically Signed   By: Titus Dubin M.D.   On: 10/07/2021 20:48   DG Abd 1 View  Result Date: 10/07/2021 CLINICAL DATA:  Orogastric tube placement. EXAM: ABDOMEN - 1 VIEW COMPARISON:  None Available. FINDINGS: An enteric tube is seen with its distal tip approximately 3.6 cm distal to the expected region of the gastroesophageal junction. The bowel gas pattern is normal. A large stool burden is seen within the ascending and proximal transverse colon. No radio-opaque calculi or other significant radiographic abnormality are seen. IMPRESSION: 1. Enteric tube positioning, as described above. Further advancement of the NG tube by proximally 7 cm is recommended to decrease the risk of aspiration.  2. Large stool burden without evidence of bowel obstruction. Electronically Signed   By: Virgina Norfolk M.D.   On: 10/07/2021 18:45   DG Abd 1 View  Result Date: 10/07/2021 CLINICAL DATA:  Orogastric tube placement. EXAM: ABDOMEN - 1 VIEW COMPARISON:   October 07, 2021 FINDINGS: An enteric tube is seen with its distal tip approximately 3.4 cm distal to the expected region of the gastroesophageal junction. This is unchanged in position when compared to the prior study. The bowel gas pattern is normal. A large stool burden is seen within the ascending and proximal transverse colon. No radio-opaque calculi or other significant radiographic abnormality are seen. IMPRESSION: 1. Enteric tube positioning, as described above. Further advancement of the NG tube by approximately 6 cm is recommended to decrease the risk of aspiration. 2. Large stool burden within the ascending and proximal transverse colon, without evidence of bowel obstruction. Electronically Signed   By: Virgina Norfolk M.D.   On: 10/07/2021 18:43   DG Chest Port 1 View  Result Date: 10/07/2021 CLINICAL DATA:  Hypertension, postoperative day 1 for cervical spine surgery. EXAM: PORTABLE CHEST 1 VIEW COMPARISON:  10/07/2021 4:51 p.m. FINDINGS: An endotracheal tube is present with tip 3.6 cm above the carina. Left central line tip: Lower SVC. A drain is coiled over the right upper chest and lower neck. Reverse left shoulder arthroplasty. Low lung volumes are present, causing crowding of the pulmonary vasculature. Mild bandlike atelectasis in the perihilar regions and bases. No appreciable pneumothorax. There is some vague osseous densities likely reflecting the patient's known osseous metastatic disease. IMPRESSION: 1. Endotracheal tube placed with tip 3.6 cm above the carina. Left central line tip: Lower SVC. No pneumothorax. 2. Low lung volumes are present with scattered perihilar and basilar atelectasis. 3. Hazy osseous densities likely reflecting the patient's known osseous metastatic disease. Electronically Signed   By: Van Clines M.D.   On: 10/07/2021 18:35   DG Chest Port 1 View  Result Date: 10/07/2021 CLINICAL DATA:  Per MD note, sepsis.  [id number] EXAM: PORTABLE CHEST 1 VIEW  COMPARISON:  Chest x-ray 03/11/2020, CT chest 10/05/2021 FINDINGS: Surgical drain and skin staples overly the neck. The heart and mediastinal contours are unchanged. Slightly low lung volumes. Curvilinear densities overlying the right lower lung zone that appear to extend along the anterior ribs likely related to known metastatic osseous disease. No definite focal consolidation. No pulmonary edema. No pleural effusion. No pneumothorax. No acute osseous abnormality. IMPRESSION: 1. Slightly low lung volumes with no acute cardiopulmonary abnormality. 2. Curvilinear densities overlying the right lower lung zone that appear to extend along the anterior ribs likely related to known metastatic osseous disease. Electronically Signed   By: Iven Finn M.D.   On: 10/07/2021 17:09   DG Cervical Spine 2-3 Views  Result Date: 10/06/2021 CLINICAL DATA:  Cervical surgery EXAM: CERVICAL SPINE - 2-3 VIEW COMPARISON:  CT 10/05/2021 FINDINGS: A single C-arm fluoroscopic image was obtained intraoperatively and submitted for post operative interpretation. Lateral image of the cervical spine with tissue spreaders present posteriorly. 2 seconds fluoroscopy time utilized. Radiation dose: 0.9 mGy. Please see the performing provider's procedural report for further detail. IMPRESSION: Intraoperative fluoroscopic image during cervical spine surgery. Electronically Signed   By: Davina Poke D.O.   On: 10/06/2021 09:28   DG C-Arm 1-60 Min-No Report  Result Date: 10/06/2021 Fluoroscopy was utilized by the requesting physician.  No radiographic interpretation.   CT CERVICAL SPINE WO CONTRAST  Result Date: 10/05/2021 CLINICAL DATA:  Metastatic disease of the cervical spine. Unknown primary. EXAM: CT CERVICAL SPINE WITHOUT CONTRAST TECHNIQUE: Multidetector CT imaging of the cervical spine was performed without intravenous contrast. Multiplanar CT image reconstructions were also generated. RADIATION DOSE REDUCTION: This exam was  performed according to the departmental dose-optimization program which includes automated exposure control, adjustment of the mA and/or kV according to patient size and/or use of iterative reconstruction technique. COMPARISON:  MRI of the cervical spine without contrast 10/03/2021 FINDINGS: Alignment: Slight degenerative anterolisthesis again noted at C3-4. Alignment is otherwise anatomic. Reversal of the normal cervical lordosis is noted. Skull base and vertebrae: Craniocervical junction is within normal limits. Extensive heterogeneous sclerotic lesions are present in the cervical spine. Left-sided sclerotic lesions present at C5 and inferiorly at C4 corresponding 2 marrow signal changes on the MRI scan. Diffuse sclerotic changes present at C6 and C7. Mixed sclerotic and lytic changes are present in the posterior elements at C5 and C6. No pathologic fractures are present. Heterogeneous sclerotic changes are present within the spinous process at T1 and T2. Soft tissues and spinal canal: The extraosseous tumor at C5 and C6 is better appreciated on MRI. Prevertebral soft tissues are within normal limits. Disc levels: Central and foraminal narrowing is greatest at C5-6 and C6-7 is described on the MRI scan. Upper chest: Lung apices are clear. IMPRESSION: 1. Extensive heterogeneous sclerotic lesions throughout the cervical spine compatible with metastatic disease to the bone. 2. Mixed sclerotic and lytic changes in the posterior elements at C5 and C6. 3. Extraosseous tumor at C5 and C6 is better appreciated on the MRI scan. 4. No pathologic fractures. 5. Central and foraminal narrowing is greatest at C5-6 and C6-7 is described on the MRI scan. Electronically Signed   By: [place] M.D.   On: 10/05/2021 12:54   CT CHEST ABDOMEN PELVIS W CONTRAST  Result Date: 10/05/2021 CLINICAL DATA:  Metastatic disease evaluation. EXAM: CT CHEST, ABDOMEN, AND PELVIS WITH CONTRAST TECHNIQUE: Multidetector CT imaging of  the chest, abdomen and pelvis was performed following the standard protocol during bolus administration of intravenous contrast. RADIATION DOSE REDUCTION: This exam was performed according to the departmental dose-optimization program which includes automated exposure control, adjustment of the mA and/or kV according to patient size and/or use of iterative reconstruction technique. CONTRAST:  161m OMNIPAQUE IOHEXOL 300 MG/ML  SOLN COMPARISON:  None Available. FINDINGS: CT CHEST FINDINGS Cardiovascular: No significant vascular findings. Normal heart size. No pericardial effusion. Mediastinum/Nodes: No enlarged mediastinal, hilar, or axillary lymph nodes. Thyroid gland, trachea, and esophagus demonstrate no significant findings. Lungs/Pleura: Mild lingular and mild right middle lobe linear atelectasis is seen. There is no evidence of acute infiltrate, pleural effusion or pneumothorax. Musculoskeletal: A left shoulder replacement is seen with associated streak artifact. Hazy, sclerotic appearing areas are seen involving the anterolateral aspects of the second and fourth right ribs. Multilevel degenerative changes seen throughout the thoracic spine. CT ABDOMEN PELVIS FINDINGS Hepatobiliary: No focal liver abnormality is seen. No gallstones, gallbladder wall thickening, or biliary dilatation. Pancreas: Unremarkable. No pancreatic ductal dilatation or surrounding inflammatory changes. Spleen: Punctate calcified granulomas are seen within the posterior aspect of an otherwise normal-appearing spleen. Adrenals/Urinary Tract: Adrenal glands are unremarkable. Kidneys are normal, without renal calculi, focal lesion, or hydronephrosis. The urinary bladder is limited in evaluation secondary to the presence of overlying streak artifact. Stomach/Bowel: Stomach is within normal limits. The appendix is not identified. Stool is seen throughout the large bowel. No evidence of bowel wall thickening, distention, or inflammatory changes.  Noninflamed diverticula are seen throughout the sigmoid colon. Vascular/Lymphatic: Aortic atherosclerosis. Moderate severity Para-aortic and aortocaval lymphadenopathy is seen. This extends into the pelvis along the bilateral iliac chains, right greater than left Reproductive: The prostate gland is markedly limited in evaluation secondary to the presence of overlying streak artifact. Other: No abdominal wall hernia or abnormality. No abdominopelvic ascites. 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 There is an extensive amount of associated streak artifact with subsequently limited evaluation of the adjacent osseous and soft tissue structures. Small lytic areas are seen at the levels of L4 and S1, with diffusely sclerotic changes seen throughout the remainder of the sacrum and left iliac bone. A mixed cystic and sclerotic lesion is seen within the right iliac bone. Multilevel degenerative changes are seen throughout the lumbar spine. IMPRESSION: 1. Moderate severity retroperitoneal and pelvic lymphadenopathy, consistent with metastatic disease. 2. Small lytic areas at the levels of L4 and S1, with diffusely sclerotic changes involving the second and fourth right ribs, sacrum and left iliac bone. 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 Further evaluation with a whole body nuclear medicine bone scan is recommended. 3. 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 4. Sigmoid diverticulosis. 5. Aortic atherosclerosis. Aortic Atherosclerosis (ICD10-I70.0). Electronically Signed   By: Virgina Norfolk M.D.   On: 10/05/2021 01:56   MR CERVICAL SPINE WO CONTRAST  Result Date: 10/03/2021 CLINICAL DATA:  Neck and left shoulder pain EXAM: MRI CERVICAL SPINE WITHOUT CONTRAST TECHNIQUE: Multiplanar, multisequence MR imaging of the cervical spine was performed. No intravenous contrast was administered. COMPARISON:  None Available. FINDINGS:  Alignment: Reversal of cervical lordosis. Mild anterolisthesis at C3-C4. Vertebrae: Multifocal abnormal marrow signal particularly involving C4-C7 and included upper thoracic vertebral bodies. This includes the posterior elements. Dorsal epidural soft tissue is present at C4. Circumferential involvement is present at C5-C7. There is marked canal stenosis dorsal to C6 without cord compression. Cord: No abnormal signal. Posterior Fossa, vertebral arteries, paraspinal tissues: Partially imaged paranasal sinus opacification. Disc levels: C2-C3:  Left facet hypertrophy.  No canal or foraminal stenosis. C3-C4: Disc bulge with endplate osteophytes. Left facet hypertrophy. Effacement of the ventral subarachnoid space without significant canal narrowing. No foraminal stenosis. C4-C5: Disc bulge with superimposed small right foraminal protrusion and endplate osteophytes. Left greater than right facet hypertrophy. Dorsal epidural soft tissue. No canal stenosis. No left foraminal stenosis. Probable mild right foraminal stenosis secondary to extraosseous disease. C5-C6: Disc bulge with endplate osteophytes. Facet and uncovertebral hypertrophy. Circumferential epidural soft tissue. Moderate to marked canal stenosis at disc level. Effacement of left greater than right foramina due to extraosseous disease. C6-C7: Disc bulge with endplate osteophytes. Uncovertebral and facet hypertrophy. Moderate canal stenosis at disc level. Effacement of left greater than right foramina due to extraosseous disease. C7-T1: No canal or right foraminal stenosis. Partial effacement left foramen due to extraosseous disease. IMPRESSION: Multifocal abnormal marrow signal with relatively diffuse involvement of C4 into the thoracic spine. Associated epidural disease, greatest at C6 where there is marked canal stenosis without cord compression. Left greater than right foraminal effacement due to extraosseous disease. May reflect metastatic disease or marrow  infiltrative neoplastic process. These results will be called to the ordering clinician or representative by the Radiologist Assistant, and communication documented in the PACS or [employer]. Electronically Signed   By: Macy Mis M.D.   On: 10/03/2021 10:28    PERFORMANCE STATUS (ECOG) : 4 - Bedbound  Review of Systems Unless otherwise noted,  a complete review of systems is negative.  Physical Exam General: NAD Pulmonary: Unlabored Extremities: no edema, no joint deformities Skin: no rashes Neurological: Bilateral upper extremity/lower extremity weakness  IMPRESSION: I saw patient briefly yesterday but at that time he was reportedly tired and did not feel much like talking.  Patient remains in ICU but is likely soon transferring out of the unit.  He is off pressor support.  Today, I met with patient's wife as patient was again resting.  She clearly has a reasonable understanding of the current work-up and overall poor prognosis.  She tells me that she is trying to remain positive for her husband's benefit.  She says that she was talking to him recently and he expressed living a good life and that he was ready if it was his time.  However, he has also recently expressed a desire to return to work.  She remains in agreement with the current scope of treatment.  She would like to give him time to see if patient can functionally improve.  She recognizes that his performance status might limit options for cancer treatment and knows that treatment would be with palliative intent.  He has received a dose of Firmagon during this hospitalization.  Plan is for XRT after Labor Day.  Patient lived at home with wife prior to this hospitalization.  He was previously functionally independent and was still working at [employer].  Wife describes a strong social support system with family and friends.  We did discuss CODE STATUS.  She has not previously considered nor talked to her husband about their  wishes but says that she does not think that they would "want heroics."  She says that she will address that with him when he is able to discuss it.  Disposition remains unclear but likely he will require support in a facility/rehab.  He will benefit from palliative care following and in that setting.  PLAN: -Continue current scope of treatment -Will follow  Case and plan discussed with Dr. Mortimer Fries   Patient expressed understanding and was in agreement with this plan. He also understands that He can call the clinic at any time with any questions, concerns, or complaints.     Time Total: 60 minutes  Visit consisted of counseling and education dealing with the complex and emotionally intense issues of symptom management and palliative care in the setting of serious and potentially life-threatening illness.Greater than 50%  of this time was spent counseling and coordinating care related to the above assessment and plan.  Signed by: Altha Harm, PhD, NP-C

## 2021-10-12 ENCOUNTER — Inpatient Hospital Stay: Payer: BC Managed Care – PPO

## 2021-10-12 DIAGNOSIS — J9601 Acute respiratory failure with hypoxia: Secondary | ICD-10-CM

## 2021-10-12 DIAGNOSIS — J9602 Acute respiratory failure with hypercapnia: Secondary | ICD-10-CM | POA: Diagnosis present

## 2021-10-12 DIAGNOSIS — I10 Essential (primary) hypertension: Secondary | ICD-10-CM | POA: Diagnosis not present

## 2021-10-12 DIAGNOSIS — F39 Unspecified mood [affective] disorder: Secondary | ICD-10-CM | POA: Diagnosis present

## 2021-10-12 DIAGNOSIS — F419 Anxiety disorder, unspecified: Secondary | ICD-10-CM

## 2021-10-12 DIAGNOSIS — C7940 Secondary malignant neoplasm of unspecified part of nervous system: Secondary | ICD-10-CM

## 2021-10-12 DIAGNOSIS — C770 Secondary and unspecified malignant neoplasm of lymph nodes of head, face and neck: Secondary | ICD-10-CM

## 2021-10-12 DIAGNOSIS — E7800 Pure hypercholesterolemia, unspecified: Secondary | ICD-10-CM

## 2021-10-12 DIAGNOSIS — C61 Malignant neoplasm of prostate: Secondary | ICD-10-CM | POA: Diagnosis present

## 2021-10-12 DIAGNOSIS — I63521 Cerebral infarction due to unspecified occlusion or stenosis of right anterior cerebral artery: Secondary | ICD-10-CM | POA: Diagnosis present

## 2021-10-12 DIAGNOSIS — M4802 Spinal stenosis, cervical region: Secondary | ICD-10-CM | POA: Diagnosis not present

## 2021-10-12 LAB — CBC
HCT: 25.9 % — ABNORMAL LOW (ref 39.0–52.0)
Hemoglobin: 8.6 g/dL — ABNORMAL LOW (ref 13.0–17.0)
MCH: 30.6 pg (ref 26.0–34.0)
MCHC: 33.2 g/dL (ref 30.0–36.0)
MCV: 92.2 fL (ref 80.0–100.0)
Platelets: 170 10*3/uL (ref 150–400)
RBC: 2.81 MIL/uL — ABNORMAL LOW (ref 4.22–5.81)
RDW: 13.8 % (ref 11.5–15.5)
WBC: 13.1 10*3/uL — ABNORMAL HIGH (ref 4.0–10.5)
nRBC: 0.3 % — ABNORMAL HIGH (ref 0.0–0.2)

## 2021-10-12 LAB — BLOOD GAS, ARTERIAL
Acid-base deficit: 2.3 mmol/L — ABNORMAL HIGH (ref 0.0–2.0)
Bicarbonate: 21.2 mmol/L (ref 20.0–28.0)
O2 Content: 2 L/min
O2 Saturation: 96.9 %
Patient temperature: 37
pCO2 arterial: 32 mmHg (ref 32–48)
pH, Arterial: 7.43 (ref 7.35–7.45)
pO2, Arterial: 65 mmHg — ABNORMAL LOW (ref 83–108)

## 2021-10-12 LAB — PROCALCITONIN: Procalcitonin: 0.88 ng/mL

## 2021-10-12 LAB — URINALYSIS, ROUTINE W REFLEX MICROSCOPIC
Bilirubin Urine: NEGATIVE
Glucose, UA: NEGATIVE mg/dL
Ketones, ur: NEGATIVE mg/dL
Nitrite: NEGATIVE
Protein, ur: NEGATIVE mg/dL
Specific Gravity, Urine: 1.019 (ref 1.005–1.030)
pH: 5 (ref 5.0–8.0)

## 2021-10-12 LAB — CULTURE, BLOOD (ROUTINE X 2)
Culture: NO GROWTH
Culture: NO GROWTH

## 2021-10-12 LAB — COMPREHENSIVE METABOLIC PANEL
ALT: 101 U/L — ABNORMAL HIGH (ref 0–44)
AST: 70 U/L — ABNORMAL HIGH (ref 15–41)
Albumin: 2.5 g/dL — ABNORMAL LOW (ref 3.5–5.0)
Alkaline Phosphatase: 150 U/L — ABNORMAL HIGH (ref 38–126)
Anion gap: 7 (ref 5–15)
BUN: 55 mg/dL — ABNORMAL HIGH (ref 8–23)
CO2: 22 mmol/L (ref 22–32)
Calcium: 7.6 mg/dL — ABNORMAL LOW (ref 8.9–10.3)
Chloride: 104 mmol/L (ref 98–111)
Creatinine, Ser: 1.42 mg/dL — ABNORMAL HIGH (ref 0.61–1.24)
GFR, Estimated: 56 mL/min — ABNORMAL LOW (ref >60)
Glucose, Bld: 108 mg/dL — ABNORMAL HIGH (ref 70–99)
Potassium: 4.4 mmol/L (ref 3.5–5.1)
Sodium: 133 mmol/L — ABNORMAL LOW (ref 135–145)
Total Bilirubin: 1.2 mg/dL (ref 0.3–1.2)
Total Protein: 5.4 g/dL — ABNORMAL LOW (ref 6.5–8.1)

## 2021-10-12 LAB — PHOSPHORUS: Phosphorus: 4.8 mg/dL — ABNORMAL HIGH (ref 2.5–4.6)

## 2021-10-12 LAB — LACTIC ACID, PLASMA
Lactic Acid, Venous: 3 mmol/L (ref 0.5–1.9)
Lactic Acid, Venous: 1.6 mmol/L (ref 0.5–1.9)

## 2021-10-12 LAB — GLUCOSE, CAPILLARY
Glucose-Capillary: 80 mg/dL (ref 70–99)
Glucose-Capillary: 98 mg/dL (ref 70–99)
Glucose-Capillary: 129 mg/dL — ABNORMAL HIGH (ref 70–99)
Glucose-Capillary: 121 mg/dL — ABNORMAL HIGH (ref 70–99)

## 2021-10-12 LAB — MAGNESIUM: Magnesium: 2.6 mg/dL — ABNORMAL HIGH (ref 1.7–2.4)

## 2021-10-12 MED ORDER — ALBUMIN HUMAN 25 % IV SOLN
50.0000 g | Freq: Once | INTRAVENOUS | Status: AC
Start: 2021-10-12 — End: 2021-10-12
  Administered 2021-10-12: 50 g via INTRAVENOUS
  Filled 2021-10-12: qty 200

## 2021-10-12 MED ORDER — POLYETHYLENE GLYCOL 3350 17 G PO PACK
17.0000 g | PACK | Freq: Every day | ORAL | Status: DC
Start: 2021-10-12 — End: 2021-10-17
  Administered 2021-10-12 – 2021-10-15 (×4): 17 g via ORAL
  Filled 2021-10-12 (×5): qty 1

## 2021-10-12 MED ORDER — VANCOMYCIN HCL 2000 MG/400ML IV SOLN
2000.0000 mg | Freq: Once | INTRAVENOUS | Status: AC
Start: 2021-10-12 — End: 2021-10-12
  Administered 2021-10-12: 2000 mg via INTRAVENOUS
  Filled 2021-10-12: qty 400

## 2021-10-12 MED ORDER — MAGNESIUM HYDROXIDE 400 MG/5ML PO SUSP
400.0000 mL | Freq: Once | ORAL | Status: DC
  Filled 2021-10-12: qty 120

## 2021-10-12 MED ORDER — SODIUM CHLORIDE 3 % IN NEBU
4.0000 mL | INHALATION_SOLUTION | Freq: Two times a day (BID) | RESPIRATORY_TRACT | Status: AC
  Administered 2021-10-12 – 2021-10-14 (×6): 4 mL via RESPIRATORY_TRACT
  Filled 2021-10-12 (×6): qty 4

## 2021-10-12 MED ORDER — VANCOMYCIN HCL 1500 MG/300ML IV SOLN: 1500.0000 mg | INTRAVENOUS | Status: DC

## 2021-10-12 MED ORDER — SODIUM CHLORIDE 0.9 % IV BOLUS
1000.0000 mL | Freq: Once | INTRAVENOUS | Status: AC
Start: 2021-10-12 — End: 2021-10-12
  Administered 2021-10-12: 1000 mL via INTRAVENOUS

## 2021-10-12 MED ORDER — SODIUM CHLORIDE 0.9 % IV SOLN
2.0000 g | Freq: Two times a day (BID) | INTRAVENOUS | Status: DC
  Administered 2021-10-12 – 2021-10-13 (×2): 2 g via INTRAVENOUS
  Filled 2021-10-12: qty 2
  Filled 2021-10-12: qty 12.5

## 2021-10-12 MED ORDER — DEXTROSE-NACL 5-0.9 % IV SOLN: INTRAVENOUS | Status: DC

## 2021-10-12 MED ORDER — MIDODRINE HCL 5 MG PO TABS
5.0000 mg | ORAL_TABLET | Freq: Three times a day (TID) | ORAL | Status: DC
  Administered 2021-10-12 – 2021-10-17 (×14): 5 mg via ORAL
  Filled 2021-10-12 (×14): qty 1

## 2021-10-12 NOTE — Progress Notes (Signed)
Metaneb not completed. Patient is somnolent and unable to participate adequately. RN and NP informed.

## 2021-10-12 NOTE — Consult Note (Signed)
Pharmacy Antibiotic Note  Jason [name] is a 66 y.o. male admitted on 10/04/2021 with sepsis.  Pharmacy has been consulted for Cefepime and Vancomycin dosing.  Plan: Cefepime 2g IV every 12 hours Give Vancomycin '2000mg'$  IV once followed by Vancomycin '1500mg'$  IV every 24 hours Goal AUC 400-550  Est AUC: 469.6 Est Cmax: 33.1 Est Cmin: 11.1 Calculated with SCr 1.42   Height: '5\' 9"'$  (175.3 cm) Weight: 91.3 kg (201 lb 4.5 oz) IBW/kg (Calculated) : 70.7  Temp (24hrs), Avg:100.5 F (38.1 C), Min:97.9 F (36.6 C), Max:102 F (38.9 C)  Recent Labs  Lab 10/07/21 2117 10/07/21 2305 10/08/21 0355 10/08/21 0935 10/09/21 0429 10/10/21 0455 10/11/21 0500 10/12/21 0426  WBC 13.9*  --  14.5*  --  16.4* 15.3* 13.9* 13.1*  CREATININE 0.99  --  0.84  --  0.69 0.82 0.51* 1.42*  LATICACIDVEN 1.5 2.8*  --  1.6  --   --   --   --     Estimated Creatinine Clearance: 59.4 mL/min (A) (by C-G formula based on SCr of 1.42 mg/dL (H)).    No Known Allergies  Antimicrobials this admission: 8/13 Cefazolin >> 8/14 8/14 Cefepime + Vancomycin > 8/15 8/19 Cefepime > 8/19 Vancomycin >     Microbiology results: 8/14 BCx: NGTD x5 days 8/19 Bcx: sent 8/14 UCx: NGTD  8/14 MRSA PCR: Negative  Thank you for allowing pharmacy to be a part of this patient's care.  Darrick Penna 10/12/2021 8:33 PM

## 2021-10-12 NOTE — Progress Notes (Addendum)
Inpatient Rehab Admissions Coordinator:   Discussed case with OT who feels pt. 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 Had rehab MD review case and he feels admission may be appropriate, but family will have to take him home afterwards, likely at a heavy assist level. I will place consult, but AC likely will not reach out until Monday due to low staffing.   Clemens Catholic, [place], [place] Admissions Coordinator  [phone] ([place]) [phone] (office)

## 2021-10-12 NOTE — Progress Notes (Incomplete)
   NAME:  Jason [name], MRN:  [id number], DOB:  14-Jul-1955,  Subjective: Patient now with fever. Meeting sepsis criteria. Low lung volumes on previous chest xray and weakened state, high risk for pneumonia.  Patient states he feels worse this evening as far as weaker than he did earlier today  Objective: Vitals:   10/12/21 1900 10/12/21 1915  BP: 116/60 102/60  Pulse: (!) 110 (!) 108  Resp: 18 (!) 23  Temp: (!) 102 F (38.9 C) (!) 102 F (38.9 C)  SpO2: 96% 96%  08/19 AM labs show AKI with creatinine 1.42 up from 0.51, WBC 13.1 (no diff), HGB down 8.6 from 9.2 one day prior; KUB negative for acute findings  Stat labs Lactic acid 3.0 Procalcitonin 0.88 ABG 7.43 - 32-65-21.2 UA with rare bacteria and trace leukocytes Chest xray - viewed by me, cannot rule out a RLL consolidation, read by radiologist as hypoventilation/atelectasis  Assessment:  Neuro A & O,  severe weakness CV ST, soft blood pressures  Resp - diminished, very poor ability to clear secretions, unable to obtain accurate sats GU foley patent ( place 8/19 for retention)   Plan: Sepsis  I liter fluids, BP, temp and lactic acid improved so the additional 1730 to complete the 61m/kg not given Ua, blood cultures collected Started cefepime and vanc Obtain PICC and discontinue central line - will need to be done 8/20 when PICC can be placed  HSevere ACUTE Hypoxic and Hypercapnic Respiratory Failure Incentive spirometry Flutter valve DuoNeb qid 8/19 patient with cough not allowing him to fully clear chest.  Hypertonic saline neb BID and nasotracheal suctioning Currently requiring heated high flow  AKI   Continue IV fluids Pharmacy consulted for renal dosing of meds

## 2021-10-12 NOTE — Progress Notes (Signed)
PROGRESS NOTE    Jason [name]  ZOX:[id number] DOB: 04-11-1955 DOA: 10/04/2021 PCP: Default, Provider, MD     Brief Narrative:  8/19 afebrile overnight, tachypneic, tachycardic, hypotensive   Subjective: 66 year old WM PMHx essential HTN, unstable angina, PVC, PSVT, NSVT, hypothyroidism, GERD, BPH, prostate cancer metastatic to multiple sites,   Presented with neck/shoulder pain found to have widely metastatic cancer suspicious for prostate He was found to have widely metastatic prostate cancer including calvarial mets and epidural spinal tumor, now s/p emergent C6 cervical laminectomy for resection of the epidural tumor. Surgery was on 8/13. Palliative radiation therapy is planned to cervical spine. Neurology is requested to consult regarding MRI finding on 8/15 of small acute ischemic R parietal infarct (TTE 8/15 showed no e/o intracardiac clot but did not include bubble study.     s/p emergent C6 cervical laminectomy with resection of epidural spinal tumor.  He is POD 1 and having some worsening shock despite fluids.  Becoming more confused, lethargic so PCCM consulted.   POST OP RESP FAILURE AND EXTUBATED 8/15 Patient with very weak cough High risk for aspiration   Assessment & Plan: Covid vaccination;   Principal Problem:   Cervical spinal stenosis Active Problems:   Metastatic disease ([facility])   Hypertensive urgency   Hypothyroidism   Essential hypertension   GERD   BPH (benign prostatic hyperplasia)   Hyperlipidemia   Elevated PSA   Abnormal MRI   Goals of care, counseling/discussion   Hypotension and abnormal strength/sensation    Prostate cancer metastatic to multiple sites [facility])   Palliative care encounter   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 worsening shock despite fluids and post op resp failure now extubation, high risk for aspiration  Extensive metastasis epidural space, spine, calvarium, pelvis- with significant neurological deficits,  -8/13 s/p cervical tumor debulking and canal decompression -Per PCCM note patient to follow with oncology and radiation oncology. - 8/19 when more stable officially consult radiation oncology   Severe ACUTE Hypoxic and Hypercapnic Respiratory Failure -Extubated -Weak cough -High risk for aspiration and re-intubation -Chest PT okay by neuro -Incentive spirometry - Flutter valve - DuoNeb qid - 8/19 patient with cough not allowing him to fully clear chest.  Hypertonic saline neb BID  Hypotension - 8/19 Midodrine 5 mg TID: Hold for SBP> 140 or DBP> 105 -8/19 Albumin 50 g x 1 -D5-0.9% saline 81m/hr -Strict in and out - Daily weight -MAP goal> 80  Shock -Most likely secondary to extensive metastatic disease -Vasopressors discontinued - Resolved  RIGHT parietal stroke - Neurology consulted -Posterior cervical incision management and MAP goal> 80 per NSGY -Wean off dexamethasone to promote wound healing.   Anxiety - Clonazepam 0.5 mg BID - Prozac 20 mg daily      Mobility Assessment (last 72 hours)     Mobility Assessment     Row Name 10/11/21 1535 10/11/21 1500 10/11/21 0000 10/10/21 2100 10/10/21 1324   Does patient have an order for bedrest or is patient medically unstable -- -- Yes- Bedfast (Level 1) - Complete Yes- Bedfast (Level 1) - Complete --   What is the highest  level of mobility based on the progressive mobility assessment? Level 1 (Bedfast) - Unable to balance while sitting on edge of bed Level 2 (Chairfast) - Balance while sitting on edge of bed and cannot stand -- -- Level 1  (Bedfast) - Unable to balance while sitting on edge of bed    Row Name 10/10/21 1100 10/10/21 0900 10/09/21 2000       Does patient have an order for bedrest or is patient medically unstable -- Yes- Bedfast (Level 1) - Complete Yes- Bedfast (Level 1) - Complete     What is the highest level of mobility based on the progressive mobility assessment? Level 1 (Bedfast) - Unable to balance while sitting on edge of bed Level 1 (Bedfast) - Unable to balance while sitting on edge of bed Level 1 (Bedfast) - Unable to balance while sitting on edge of bed             Goals of care - 8/19 Palliative Care consult.  Patient with extensive metastatic prostate cancer.  Discuss change of CODE STATUS to DNR.  Discussed short-term vs long-term goals of care to include palliative care, hospice.          DVT prophylaxis: Lovenox Code Status: Full Family Communication:  Status is: Inpatient    Dispo: The patient is from: Home              Anticipated d/c is to: SNF              Anticipated d/c date is: > 3 days              Patient currently is not medically stable to d/c.      Consultants:  PCCM Dr. Mortimer Fries Neurosurgery Dr. Valli Glance Neurology Dr. Su Monks Oncology Dr. Altha Harm   Procedures/Significant Events:  8/12 admitted with chronic neck pain and worsening L hand paresthesias, L arm weakness 8/14  Pt transferred to ICU with hypotension along with significant weakness in upper and lower extremities.  Required mechanical intubation for MRI Cervical Spine due to severe claustrophobia  8/14: MR Cervical Spine revealed interval bilateral C6        hemilaminectomies. Similar degree of epidural tumor from          C4-C7 without definite superimposed epidural hematoma or        fluid collection. Progressive spinal canal stenosis from C4-C7,        severe at C5-C6 and C6-C7. No abnormal spinal cord signal or        cord infarct. Similar diffuse cervical osseous metastatic  disease. 8/15: Pt remains mechanically intubated.  Orders placed for MR Brain, Lumbar Spine, and Thoracic Spine to further assess etiology of acute BLE weakness/numbness.  Extubated later in day. 8/18 weak cough, alert and awake, agressive chest PT ordered   I have personally reviewed and interpreted all radiology studies and my findings are as above.  VENTILATOR SETTINGS: Nasal cannula 8/19 Flow 5 L/min SPO2 97%   Cultures   Antimicrobials: Anti-infectives (From admission, onward)    Start     Dose/Rate Route Frequency Ordered Stop   10/08/21 1000  vancomycin (VANCOCIN) IVPB 1000 mg/200 mL premix  Status:  Discontinued        1,000 mg 200 mL/hr over 60 Minutes Intravenous Every 12 hours 10/07/21 1917 10/08/21 0935   10/07/21 2200  ceFEPIme (MAXIPIME) 2 g in sodium chloride 0.9 % 100 mL IVPB  Status:  Discontinued  2 g 200 mL/hr over 30 Minutes Intravenous Every 8 hours 10/07/21 1701 10/08/21 0935   10/07/21 1800  vancomycin (VANCOREADY) IVPB 2000 mg/400 mL        2,000 mg 200 mL/hr over 120 Minutes Intravenous  Once 10/07/21 1701 10/07/21 2300   10/06/21 2330  ceFAZolin (ANCEF) IVPB 2g/100 mL premix        2 g 200 mL/hr over 30 Minutes Intravenous  Once 10/06/21 2231 10/07/21 0049   10/06/21 0736  ceFAZolin (ANCEF) 2-4 GM/100ML-% IVPB       Note to Pharmacy: Mirna Mires: cabinet override      10/06/21 0736 10/06/21 1944         Devices    LINES / TUBES:      Continuous Infusions:  sodium chloride 250 mL (10/12/21 1905)   albumin human     dextrose 5 % and 0.9% NaCl     sodium chloride       Objective: Vitals:   10/12/21 1845 10/12/21 1900 10/12/21 1915 10/12/21 1930  BP: 123/64 116/60 102/60 (!) 101/57  Pulse: (!) 110 (!) 110 (!) 108 (!) 109  Resp: (!) 29 18 (!) 23 (!) 27  Temp: (!) 102 F (38.9 C) (!) 102 F (38.9 C) (!) 102 F (38.9 C) (!) 102 F (38.9 C)  TempSrc:      SpO2: 96% 96% 96% 96%  Weight:      Height:         Intake/Output Summary (Last 24 hours) at 10/12/2021 1956 Last data filed at 10/12/2021 1905 Gross per 24 hour  Intake 1300 ml  Output 2445 ml  Net -1145 ml   Filed Weights   10/04/21 1656 10/07/21 1645  Weight: 85 kg 91.3 kg    Examination:  General: A/O x4, positive acute respiratory distress Eyes: negative scleral hemorrhage, negative anisocoria, negative icterus ENT: Negative Runny nose, negative gingival bleeding, Neck:  Negative scars, masses, torticollis, lymphadenopathy, JVD Lungs: diffuse rhonchi bilaterally, positive mild expiratory wheezes, negative Cardiovascular: Sinus tachycardia without murmur gallop or rub normal S1 and S2 Abdomen: negative abdominal pain, nondistended, positive soft, bowel sounds, no rebound, no ascites, no appreciable mass Extremities: No significant cyanosis, clubbing, or edema bilateral lower extremities Skin: Negative rashes, lesions, ulcers Psychiatric:  Negative depression, negative anxiety, negative fatigue, negative mania  Central nervous system:  Cranial nerves II through XII intact, tongue/uvula midline, bilateral UE strength 4/5 in shoulders and elbows bilaterally, 2/5 bilateral hand grip, bilateral lower extremity strength 1/5, sensation intact throughout, negative dysarthria, negative expressive aphasia, negative receptive aphasia.  .     Data Reviewed: Care during the described time interval was provided by me .  I have reviewed this patient's available data, including medical history, events of note, physical examination, and all test results as part of my evaluation.  CBC: Recent Labs  Lab 10/08/21 0355 10/09/21 0429 10/10/21 0455 10/11/21 0500 10/12/21 0426  WBC 14.5* 16.4* 15.3* 13.9* 13.1*  HGB 10.2* 10.0* 8.8* 9.2* 8.6*  HCT 30.4* 30.4* 26.5* 28.2* 25.9*  MCV 91.6 93.3 93.0 93.4 92.2  PLT 142* 137* 175 173 628   Basic Metabolic Panel: Recent Labs  Lab 10/08/21 0355 10/09/21 0429 10/10/21 0455 10/11/21 0500  10/12/21 0426  NA 135 136 136 136 133*  K 4.0 4.1 3.8 3.7 4.4  CL 103 108 107 106 104  CO2 '22 23 24 23 22  '$ GLUCOSE 163* 130* 143* 93 108*  BUN 26* 29* 46* 32* 55*  CREATININE 0.84 0.69  0.82 0.51* 1.42*  CALCIUM 8.3* 8.2* 8.2* 7.8* 7.6*  MG 2.2 2.6* 2.6*  --  2.6*  PHOS 4.5 3.3 3.0  --  4.8*   GFR: Estimated Creatinine Clearance: 59.4 mL/min (A) (by C-G formula based on SCr of 1.42 mg/dL (H)). Liver Function Tests: Recent Labs  Lab 10/08/21 0355 10/09/21 0429 10/10/21 0455 10/11/21 0500 10/12/21 0426  AST 50* 42* 69* 121* 70*  ALT 18 22 51* 134* 101*  ALKPHOS 228* 202* 171* 170* 150*  BILITOT 1.2 1.4* 1.2 1.4* 1.2  PROT 6.3* 6.0* 5.8* 5.8* 5.4*  ALBUMIN 3.4* 3.0* 2.8* 2.8* 2.5*   No results for input(s): "LIPASE", "AMYLASE" in the last 168 hours. No results for input(s): "AMMONIA" in the last 168 hours. Coagulation Profile: No results for input(s): "INR", "PROTIME" in the last 168 hours. Cardiac Enzymes: No results for input(s): "CKTOTAL", "CKMB", "CKMBINDEX", "TROPONINI" in the last 168 hours. BNP (last 3 results) No results for input(s): "PROBNP" in the last 8760 hours. HbA1C: Recent Labs    10/10/21 0455  HGBA1C 5.6   CBG: Recent Labs  Lab 10/11/21 2024 10/11/21 2202 10/12/21 0748 10/12/21 1134 10/12/21 1602  GLUCAP 134* 103* 80 98 129*   Lipid Profile: Recent Labs    10/10/21 0455  LDLDIRECT 62   Thyroid Function Tests: No results for input(s): "TSH", "T4TOTAL", "FREET4", "T3FREE", "THYROIDAB" in the last 72 hours. Anemia Panel: No results for input(s): "VITAMINB12", "FOLATE", "FERRITIN", "TIBC", "IRON", "RETICCTPCT" in the last 72 hours. Sepsis Labs: Recent Labs  Lab 10/07/21 0308 10/07/21 2117 10/07/21 2305 10/08/21 0935  PROCALCITON <0.10  --   --   --   LATICACIDVEN  --  1.5 2.8* 1.6    Recent Results (from the past 240 hour(s))  MRSA Next Gen by PCR, Nasal     Status: None   Collection Time: 10/07/21  4:54 PM   Specimen: Nasal  Mucosa; Nasal Swab  Result Value Ref Range Status   MRSA by PCR Next Gen NOT DETECTED NOT DETECTED Final    Comment: (NOTE) The GeneXpert MRSA Assay (FDA approved for NASAL specimens only), is one component of a comprehensive MRSA colonization surveillance program. It is not intended to diagnose MRSA infection nor to guide or monitor treatment for MRSA infections. Test performance is not FDA approved in patients less than 60 years old. Performed at [facility], [address], [place], [place] [address]   Urine Culture     Status: None   Collection Time: 10/07/21  9:17 PM   Specimen: Urine, Random  Result Value Ref Range Status   Specimen Description   Final    URINE, RANDOM Performed at [facility], [address] [place], [place] [address]    Special Requests   Final    NONE Performed at [facility], [address], [place], [place] [address]    Culture   Final    NO GROWTH Performed at [facility], [place] [address], [place], [place] [address]    Report Status 10/09/2021 FINAL  Final  Culture, blood (Routine X 2) w Reflex to ID Panel     Status: None   Collection Time: 10/07/21  9:18 PM   Specimen: BLOOD  Result Value Ref Range Status   Specimen Description BLOOD BLOOD RIGHT HAND  Final   Special Requests   Final    BOTTLES DRAWN AEROBIC AND ANAEROBIC Blood Culture results may not be optimal due to an inadequate volume of blood received  in culture bottles   Culture   Final    NO GROWTH 5 DAYS Performed at [facility], [address] [place], [place] [address]    Report Status 10/12/2021 FINAL  Final  Culture, blood (Routine X 2) w Reflex to ID Panel     Status: None   Collection Time: 10/07/21  9:29 PM   Specimen: BLOOD  Result Value Ref Range Status   Specimen Description BLOOD BLOOD LEFT HAND  Final   Special Requests   Final    BOTTLES DRAWN AEROBIC AND ANAEROBIC Blood Culture results may not be optimal due to an  inadequate volume of blood received in culture bottles   Culture   Final    NO GROWTH 5 DAYS Performed at [facility], [address] [place], [place] [address]    Report Status 10/12/2021 FINAL  Final         Radiology Studies: DG Abd 1 View  Result Date: 10/12/2021 CLINICAL DATA:  Constipation EXAM: ABDOMEN - 1 VIEW COMPARISON:  10/07/2021 FINDINGS: No bowel dilatation to suggest obstruction. No evidence of pneumoperitoneum, portal venous gas or pneumatosis. No pathologic calcifications along the expected course of the ureters. No acute osseous abnormality. IMPRESSION: Negative. Electronically Signed   By: Kathreen Devoid M.D.   On: 10/12/2021 17:49   US Venous Img Lower Bilateral (DVT)  Result Date: 10/11/2021 CLINICAL DATA:  CVA. EXAM: BILATERAL LOWER EXTREMITY VENOUS DOPPLER ULTRASOUND TECHNIQUE: Gray-scale sonography with graded compression, as well as color Doppler and duplex ultrasound were performed to evaluate the lower extremity deep venous systems from the level of the common femoral vein and including the common femoral, femoral, profunda femoral, popliteal and calf veins including the posterior tibial, peroneal and gastrocnemius veins when visible. The superficial great saphenous vein was also interrogated. Spectral Doppler was utilized to evaluate flow at rest and with distal augmentation maneuvers in the common femoral, femoral and popliteal veins. COMPARISON:  None Available. FINDINGS: RIGHT LOWER EXTREMITY Common Femoral Vein: No evidence of thrombus. Normal compressibility, respiratory phasicity and response to augmentation. Saphenofemoral Junction: No evidence of thrombus. Normal compressibility and flow on color Doppler imaging. Profunda Femoral Vein: No evidence of thrombus. Normal compressibility and flow on color Doppler imaging. Femoral Vein: No evidence of thrombus. Normal compressibility, respiratory phasicity and response to augmentation. Popliteal Vein: No  evidence of thrombus. Normal compressibility, respiratory phasicity and response to augmentation. Calf Veins: No evidence of thrombus. Normal compressibility and flow on color Doppler imaging. Superficial Great Saphenous Vein: No evidence of thrombus. Normal compressibility. Venous Reflux:  None. Other Findings: No evidence of superficial thrombophlebitis or abnormal fluid collection. LEFT LOWER EXTREMITY Common Femoral Vein: No evidence of thrombus. Normal compressibility, respiratory phasicity and response to augmentation. Saphenofemoral Junction: No evidence of thrombus. Normal compressibility and flow on color Doppler imaging. Profunda Femoral Vein: No evidence of thrombus. Normal compressibility and flow on color Doppler imaging. Femoral Vein: No evidence of thrombus. Normal compressibility, respiratory phasicity and response to augmentation. Popliteal Vein: No evidence of thrombus. Normal compressibility, respiratory phasicity and response to augmentation. Calf Veins: No evidence of thrombus. Normal compressibility and flow on color Doppler imaging. Superficial Great Saphenous Vein: No evidence of thrombus. Normal compressibility. Venous Reflux:  None. Other Findings: No evidence of superficial thrombophlebitis or abnormal fluid collection. IMPRESSION: No evidence of deep venous thrombosis in either lower extremity. Electronically Signed   By: Aletta Edouard M.D.   On: 10/11/2021 16:57   ECHOCARDIOGRAM LIMITED BUBBLE STUDY  Result  Date: 10/11/2021    ECHOCARDIOGRAM LIMITED REPORT   Patient Name:   KYE HEDDEN Date of Exam: 10/11/2021 Medical Rec #:  [id number]        Height:       69.0 in Accession #:    [id number]       Weight:       201.3 lb Date of Birth:  [birth year]        BSA:          2.072 m Patient Age:    57 years         BP:           140/75 mmHg Patient Gender: M                HR:           103 bpm. Exam Location:  [facility] Procedure: Limited Echo and Saline Contrast Bubble Study Indications:      I63.9 Stroke  History:         Patient has prior history of Echocardiogram examinations, most                  recent 10/08/2021. Risk Factors:Hypertension.  Sonographer:     Charmayne Sheer Referring Phys:  [place] Diagnosing Phys: Ida Rogue MD IMPRESSIONS  1. Left ventricular ejection fraction, by estimation, is 60 to 65%. The left ventricle has normal function. The left ventricle has no regional wall motion abnormalities. Left ventricular diastolic parameters are indeterminate.  2. Right ventricular systolic function is normal. The right ventricular size is normal  3. Agitated saline contrast bubble study was negative, with no evidence of any interatrial shun  4. Limited study FINDINGS  Left Ventricle: Left ventricular ejection fraction, by estimation, is 60 to 65%. The left ventricle has normal function. The left ventricle has no regional wall motion abnormalities. The left ventricular internal cavity size was normal in size. There is  no left ventricular hypertrophy. Left ventricular diastolic parameters are indeterminate. Right Ventricle: The right ventricular size is normal. No increase in right ventricular wall thickness. Right ventricular systolic function is normal. Left Atrium: Left atrial size was normal in size. Right Atrium: Right atrial size was normal in size. Pericardium: There is no evidence of pericardial effusion. Mitral Valve: The mitral valve was not well visualized. No evidence of mitral valve stenosis. Tricuspid Valve: The tricuspid valve is not well visualized. Tricuspid valve regurgitation is not demonstrated. No evidence of tricuspid stenosis. Aortic Valve: The aortic valve was not well visualized. Aortic valve regurgitation is not visualized. No aortic stenosis is present. Pulmonic Valve: The pulmonic valve was not well visualized. Pulmonic valve regurgitation is not visualized. No evidence of pulmonic stenosis. Aorta: The aortic root was not well visualized. Venous: The  pulmonary veins were not well visualized. The inferior vena cava was not well visualized. IAS/Shunts: No atrial level shunt detected by color flow Doppler. Agitated saline contrast was given intravenously to evaluate for intracardiac shunting. Agitated saline contrast bubble study was negative, with no evidence of any interatrial shunt. Ida Rogue MD Electronically signed by Ida Rogue MD Signature Date/Time: 10/11/2021/11:41:14 AM    Final    DG Chest Port 1 View  Result Date: 10/11/2021 CLINICAL DATA:  Respiratory failure.  Metastatic prostate cancer. EXAM: PORTABLE CHEST 1 VIEW COMPARISON:  Chest x-ray dated 10/09/2021. FINDINGS: Cardiomediastinal silhouette is stable in size and configuration. LEFT-sided PICC line is stable in position with tip at the level of the lower  SVC/cavoatrial junction. Study is hypoinspiratory with crowding of the bibasilar markings, RIGHT greater than LEFT. Lungs appear clear. No pleural effusion or pneumothorax is seen. IMPRESSION: Low lung volumes. No evidence of pneumonia or pulmonary edema. Electronically Signed   By: Franki Cabot M.D.   On: 10/11/2021 08:09   CT ANGIO HEAD NECK W WO CM  Result Date: 10/10/2021 CLINICAL DATA:  Left hand paresthesia.  Metastatic prostate cancer. EXAM: CT ANGIOGRAPHY HEAD AND NECK TECHNIQUE: Multidetector CT imaging of the head and neck was performed using the standard protocol during bolus administration of intravenous contrast. Multiplanar CT image reconstructions and MIPs were obtained to evaluate the vascular anatomy. Carotid stenosis measurements (when applicable) are obtained utilizing NASCET criteria, using the distal internal carotid diameter as the denominator. RADIATION DOSE REDUCTION: This exam was performed according to the departmental dose-optimization program which includes automated exposure control, adjustment of the mA and/or kV according to patient size and/or use of iterative reconstruction technique. CONTRAST:   80m OMNIPAQUE IOHEXOL 350 MG/ML SOLN COMPARISON:  None Available. FINDINGS: CT HEAD FINDINGS Brain: There is no mass, hemorrhage or extra-axial collection. The size and configuration of the ventricles and extra-axial CSF spaces are normal. There is no acute or chronic infarction. The brain parenchyma is normal. Skull: The visualized skull base, calvarium and extracranial soft tissues are normal. Sinuses/Orbits: No fluid levels or advanced mucosal thickening of the visualized paranasal sinuses. No mastoid or middle ear effusion. The orbits are normal. CTA NECK FINDINGS SKELETON: Numerous sclerotic lesions throughout the cervical spine. OTHER NECK: Normal pharynx, larynx and major salivary glands. No cervical lymphadenopathy. Unremarkable thyroid gland. UPPER CHEST: Right nodal lobe atelectasis and hazy opacities in the right upper lobe. AORTIC ARCH: There is calcific atherosclerosis of the aortic arch. There is no aneurysm, dissection or hemodynamically significant stenosis of the visualized portion of the aorta. Conventional 3 vessel aortic branching pattern. The visualized proximal subclavian arteries are widely patent. RIGHT CAROTID SYSTEM: Normal without aneurysm, dissection or stenosis. LEFT CAROTID SYSTEM: Normal without aneurysm, dissection or stenosis. VERTEBRAL ARTERIES: Right dominant configuration. Both origins are clearly patent. There is no dissection, occlusion or flow-limiting stenosis to the skull base (V1-V3 segments). CTA HEAD FINDINGS POSTERIOR CIRCULATION: --Vertebral arteries: Normal V4 segments. --Inferior cerebellar arteries: Normal. --Basilar artery: Normal. --Superior cerebellar arteries: Normal. --Posterior cerebral arteries (PCA): Normal. ANTERIOR CIRCULATION: --Intracranial internal carotid arteries: Normal. --Anterior cerebral arteries (ACA): Normal. Both A1 segments are present. Patent anterior communicating artery (a-comm). --Middle cerebral arteries (MCA): Normal. VENOUS SINUSES: As  permitted by contrast timing, patent. ANATOMIC VARIANTS: None Review of the MIP images confirms the above findings. Review of the MIP images confirms the above findings IMPRESSION: 1. No emergent large vessel occlusion, hemodynamically significant stenosis, aneurysm or dissection of the major cervical or intracranial arteries. 2. Numerous sclerotic lesions throughout the cervical spine, consistent with osseous metastatic disease. 3. Right middle lobe atelectasis and hazy opacities in the right upper lobe, possibly infection or asymmetric pulmonary edema. 4. Aortic Atherosclerosis (ICD10-I70.0). Electronically Signed   By: KUlyses JarredM.D.   On: 10/10/2021 21:55        Scheduled Meds:  ascorbic acid  500 mg Oral Daily   budesonide (PULMICORT) nebulizer solution  0.5 mg Nebulization BID   Chlorhexidine Gluconate Cloth  6 each Topical Q0600   clonazePAM  0.5 mg Oral BID   dextromethorphan-guaiFENesin  1 tablet Oral BID   docusate sodium  100 mg Oral BID   enoxaparin (LOVENOX) injection  40 mg Subcutaneous Q24H  famotidine  20 mg Oral Daily   feeding supplement  237 mL Oral TID BM   FLUoxetine  20 mg Oral QHS   insulin aspart  0-5 Units Subcutaneous QHS   insulin aspart  0-9 Units Subcutaneous TID WC   ipratropium-albuterol  3 mL Nebulization QID   levothyroxine  88 mcg Oral Q0600   loratadine  10 mg Oral BID   meloxicam  15 mg Oral Daily   midodrine  5 mg Oral TID WC   multivitamin with minerals  1 tablet Oral Daily   polyethylene glycol  17 g Oral Daily   potassium chloride  10 mEq Oral QHS   riluzole  50 mg Oral Q12H   rosuvastatin  5 mg Oral Daily   sodium chloride HYPERTONIC  4 mL Nebulization BID   sorbitol, milk of mag, mineral oil, glycerin (SMOG) enema  400 mL Rectal Once   tamsulosin  0.4 mg Oral Daily   Continuous Infusions:  sodium chloride 250 mL (10/12/21 1905)   albumin human     dextrose 5 % and 0.9% NaCl     sodium chloride       LOS: 7 days    Time spent:40  min    Nixie Laube, Geraldo Docker, MD [facility]   If 7PM-7AM, please contact night-coverage 10/12/2021, 7:56 PM

## 2021-10-12 NOTE — TOC Progression Note (Addendum)
Transition of Care [facility]) - Progression Note    Patient Details  Name: Jason [name] MRN: [id number] Date of Birth: 1955/03/30  Transition of [facility]) CM/SW [place], LCSW Phone Number: 10/12/2021, 11:21 AM  Clinical Narrative:    Per chart review, patient will likely need rehab with palliative following. CSW reached out to Mickel Baas with [facility] to see if they are still considering patient for acute inpatient rehab.   1:26- Per Mickel Baas with [facility], someone from [facility] will follow up Monday.    Expected Discharge Plan:  (TBD) Barriers to Discharge: Continued Medical Work up  Expected Discharge Plan and Services Expected Discharge Plan:  (TBD)   Discharge Planning Services: CM Consult   Living arrangements for the past 2 months: Single Family Home                                       Social Determinants of Health (SDOH) Interventions    Readmission Risk Interventions     No data to display

## 2021-10-12 NOTE — Progress Notes (Signed)
Speech Language Pathology Treatment: Dysphagia  Patient Details Name: Jason [name] MRN: [id number] DOB: 10-Mar-1955 Today's Date: 10/12/2021 Time: 0930-1005 [name] Time Calculation (min) (ACUTE ONLY): 35 min  Assessment / Plan / Recommendation Clinical Impression  Pt and Wife seen today as breakfast meal was being finished; pt appeared fatigued w/ overall exertion. Pt has been tolerating his regular consistency diet(for ease of choices of foods) though well-cut and in SMALL bites/sips. Wife is monitoring Particulate foods and moistening those well also; avoiding Salads. "we are using the strategies and choosing easy to chew foods" -- per Wife/pt. Pt stated "2 syrups" were used on the pancake at breakfast.  Discussed general swallowing and impact of illness/fatigue on swallowing; discussed conservation of energy strategies including Stopping PT/OT therapies ~1 hour prior to Meals to allow for rest. Pt is dependent for feeding and needs Rest Breaks frequently during meals. Wife agreed. Education completed w/ Wife and pt on gneeral aspiration precautions; foods/consistencies(easy to chew foods); easy to eat options; use of condiments/gravies. Dietician is following w/ supplements.   Pt and Wife denied any overt s/s of aspiration during recent meals; NSG agreed. Labs/chart reviewed. Recommend continue current regular/mech soft diet for ease of soft foods w/ gravies added to moisten foods; avoid Particulate foods and TALKING/Distractions while eating. Thin liquids. Recommend general aspiration precautions; frequent Rest Breaks to conserve energy and not eating meals immediately following exerting therapies or bath. Pills Whole in Puree; feeding assistance at meals d/t UE weakness.  No further skilled ST services at this time unless decline in swallowing status is noted/reported. Monitor status moving forward in setting of Baseline medical issues.     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  He reports that he had a left shoulder arthroplasty in March of this year.  Prior to that, he was having pain around his shoulder blade and left shoulder.  His shoulder arthroplasty did help somewhat, but he has continued to have left-sided weakness with abduction of his left shoulder and with extension of his arm.  This been ongoing for several months.  He has tingling down his left arm into his first 2 digits of his left hand.  Pt extubated yesterday post 1 day oral intubation for MRI.  CXR: There are no signs of pulmonary edema or focal pulmonary  consolidation. There is improvement in aeration of lower lung fields  suggesting resolving subsegmental atelectasis.      [name] Plan  All goals met      Recommendations for follow up therapy are one component of a multi-disciplinary discharge planning process, led by the attending physician.  Recommendations may be updated based on patient status, additional functional criteria and insurance authorization.    Recommendations  Diet recommendations: Regular;Thin liquid (for choices but foods well-cut and moistened. no salads) Liquids provided via: Cup;Straw (monitor) Medication Administration: Whole meds with puree Supervision: Staff to assist with self feeding;Full supervision/cueing for compensatory strategies Compensations: Minimize environmental distractions;Slow rate;Small sips/bites;Lingual sweep for clearance of pocketing;Multiple dry swallows after each bite/sip;Follow solids with liquid (Rest Breaks) Postural Changes and/or Swallow Maneuvers: Out of bed for meals;Seated upright 90 degrees;Upright 30-60 min after meal                General recommendations:  (Dietician f/u) Oral  Care Recommendations: Oral care BID;Oral  care before and after PO;Staff/trained caregiver to provide oral care Follow Up Recommendations: No [name] follow up (for swallowing at this time -- monitor status moving forward d/t baseline medical issues) Assistance recommended at discharge: Frequent or constant Supervision/Assistance (feeding support d/t UE weakness) [name] Visit Diagnosis: Dysphagia, unspecified (R13.10) (dependent for feeding; decreased Stamina) Plan: All goals met              Jason Kenner, MS, University; [name] Jason [name]  10/12/2021, 2:15 PM

## 2021-10-12 NOTE — Plan of Care (Signed)
Eating well; requires total feeding. Seen by OT and PT. Resp status improved although remains congested, receiving aggressive pulmonary toilet, frequent turn and position. Weak but productive cough for small amount clear white sputum. Skin intact; prevalon boots in place Retaining urine today, with 955 cc per bladder scan, so foley placed, with minimal difficulty. No BM for last 6 days despite Miralax and Colace. Will obtain KUB and then SMOG enema unless contraindicated. Sinus tach on monitor 105-110's. SBP soft with MAPs 50's-60's this afternoon. Skin cool in extremities, and slightly mottled. Patient mental status WNL, although does C/O being fatigued and verbalizes feeling overwhelmed. Dr. Sherral Hammers notified and Midodrine ordered with parameters. Denies pain.   Problem: Education: Goal: Knowledge of General Education information will improve Description: Including pain rating scale, medication(s)/side effects and non-pharmacologic comfort measures Outcome: Progressing   Problem: Health Behavior/Discharge Planning: Goal: Ability to manage health-related needs will improve Outcome: Progressing   Problem: Clinical Measurements: Goal: Ability to maintain clinical measurements within normal limits will improve Outcome: Progressing Goal: Will remain free from infection Outcome: Progressing Goal: Diagnostic test results will improve Outcome: Progressing Goal: Respiratory complications will improve Outcome: Progressing Goal: Cardiovascular complication will be avoided Outcome: Progressing   Problem: Activity: Goal: Risk for activity intolerance will decrease Outcome: Progressing   Problem: Nutrition: Goal: Adequate nutrition will be maintained Outcome: Progressing   Problem: Coping: Goal: Level of anxiety will decrease Outcome: Progressing   Problem: Elimination: Goal: Will not experience complications related to bowel motility Outcome: Progressing Goal: Will not experience  complications related to urinary retention Outcome: Progressing   Problem: Pain Managment: Goal: General experience of comfort will improve Outcome: Progressing   Problem: Safety: Goal: Ability to remain free from injury will improve Outcome: Progressing   Problem: Skin Integrity: Goal: Risk for impaired skin integrity will decrease Outcome: Progressing   Problem: Education: Goal: Ability to describe self-care measures that may prevent or decrease complications (Diabetes Survival Skills Education) will improve Outcome: Progressing Goal: Individualized Educational Video(s) Outcome: Progressing   Problem: Coping: Goal: Ability to adjust to condition or change in health will improve Outcome: Progressing   Problem: Fluid Volume: Goal: Ability to maintain a balanced intake and output will improve Outcome: Progressing   Problem: Health Behavior/Discharge Planning: Goal: Ability to identify and utilize available resources and services will improve Outcome: Progressing Goal: Ability to manage health-related needs will improve Outcome: Progressing   Problem: Metabolic: Goal: Ability to maintain appropriate glucose levels will improve Outcome: Progressing   Problem: Nutritional: Goal: Maintenance of adequate nutrition will improve Outcome: Progressing Goal: Progress toward achieving an optimal weight will improve Outcome: Progressing   Problem: Skin Integrity: Goal: Risk for impaired skin integrity will decrease Outcome: Progressing   Problem: Tissue Perfusion: Goal: Adequacy of tissue perfusion will improve Outcome: Progressing

## 2021-10-12 NOTE — Progress Notes (Signed)
History: Jason [name] is s/p C6 laminectomy for resection of epidural mass  POD6:  No significant neurological changes. Patient subjectively feels hands are better.   POD5: no significant neurologic changes overnight, Continues to have anxiety.    POD4: continues to have weakness in his arms and legs.  He states he does have sensation in his legs however.  He also endorses significant anxiety regarding his recent metastatic cancer diagnosis   POD2: Pt was transferred to the ICU after yesterdays events. Pt remains intubated and sedated this morning. Wife at bedside.    POD1 PM: I was contacted to see Jason [name] at approximately 1615.  I saw him in his room at approximately 1620, and was able to review the clinical situation with his wife.  From review with her, it seems that Jason [name] was having some issues with his arms when she arrived at around 64, when he reported some difficulty feeding himself with lunch.  Per nursing, his nurses also noted that he was having some difficulty with movement of his arms and legs, but he improved with a fluid bolus around 1300.    When I saw him at 1620, he was arousable but not readily interactive with examination.  He moved his arms but was not reliable. He was noted to be significantly hypotensive.  ICU attending Dr. Tamala Julian, hospitalist Dr. Sheppard Coil, and I reviewed the case and all agreed transfer to ICU was appropriate.  This was done, and his blood pressure improved with another fluid bolus.   I then reevaluated him, noted below.   POD1 AM: Increased tingling in right hand this morning. Reports good pain control   Physical Exam:     Vitals:    10/11/21 0400 10/11/21 0826  BP: 124/76    Pulse: 89    Resp: (!) 24    Temp: 98.3 F (36.8 C)    SpO2: 93% 92%      AA Ox3  CNI 4/5 bilateral deltoids 3/5 left triceps and 2/5 left bicep 3/5 right triceps and bicep. 2/5 bilateral HG. 0/5 bilateral legs Incision intact with staples in place and  open to air.    Data:   Last Labs        Recent Labs  Lab 10/09/21 0429 10/10/21 0455 10/11/21 0500  NA 136 136 136  K 4.1 3.8 3.7  CL 108 107 106  CO2 '23 24 23  '$ BUN 29* 46* 32*  CREATININE 0.69 0.82 0.51*  GLUCOSE 130* 143* 93  CALCIUM 8.2* 8.2* 7.8*       Last Labs      Recent Labs  Lab 10/11/21 0500  AST 121*  ALT 134*  ALKPHOS 170*         Last Labs        Recent Labs  Lab 10/09/21 0429 10/10/21 0455 10/11/21 0500  WBC 16.4* 15.3* 13.9*  HGB 10.0* 8.8* 9.2*  HCT 30.4* 26.5* 28.2*  PLT 137* 175 173       Last Labs   No results for input(s): "APTT", "INR" in the last 168 hours.             Other tests/results:  Pathology pending  MRI pending   Assessment/Plan:   Jason [name] is a 66 y.o s/p C6 laminectomy for resection of epidural mass.   - Appreciate ICU care and hospitalist care.  We have discussed maintaining normal BP with goal MAP>80 until 10/12/21 am - discontinue steroids  - Ok to leave  incision open to air as long as it is not draining.  - Please call with any questions or concerns   Blanche East MD, PhD Department of Neurosurgery

## 2021-10-12 NOTE — Progress Notes (Signed)
Pt now febrile, skin very warm to touch, and appears flushed. Dr. Sherral Hammers notified; pt medicated with tylenol. Blood cultures ordered and albumin also ordered. Rectal temp probe placed as oral temp not registering fever. Enema on hold for the moment; KUB read as normal in results. Wife at bedside; pt and wife updated.

## 2021-10-12 NOTE — Progress Notes (Signed)
   NAME:  Jason [name], MRN:  [id number], DOB:  02-Jul-1955,  Subjective: Patient now with fever. Meeting sepsis criteria. Low lung volumes on previous chest xray and weakened state, high risk for pneumonia.  Patient states he feels worse this evening as far as weaker than he did earlier today  Objective: Vitals:   10/12/21 1900 10/12/21 1915  BP: 116/60 102/60  Pulse: (!) 110 (!) 108  Resp: 18 (!) 23  Temp: (!) 102 F (38.9 C) (!) 102 F (38.9 C)  SpO2: 96% 96%  AM labs show AKI with creatinine 1.42 up from 0.51 Lactic acid 3.0 Procalcitonin 0.88 ABG 7.43 - 32-65-21.2 UA with rare bacteria and trace leukocytes Chest xray - viewed by me, cannot rule out a RLL consolidation, read by radiologist as hypoventilation/atelectasis  Assessment:  Neuro A & O,  severe weakness CV ST, soft blood pressures  Resp - diminished, very poor ability to clear secretions, unable to obtain accurate sats GU foley patent  Plan: ***

## 2021-10-13 ENCOUNTER — Inpatient Hospital Stay: Payer: Self-pay

## 2021-10-13 DIAGNOSIS — R9389 Abnormal findings on diagnostic imaging of other specified body structures: Secondary | ICD-10-CM | POA: Diagnosis not present

## 2021-10-13 DIAGNOSIS — I10 Essential (primary) hypertension: Secondary | ICD-10-CM | POA: Diagnosis not present

## 2021-10-13 DIAGNOSIS — I639 Cerebral infarction, unspecified: Secondary | ICD-10-CM | POA: Diagnosis not present

## 2021-10-13 DIAGNOSIS — J9601 Acute respiratory failure with hypoxia: Secondary | ICD-10-CM | POA: Diagnosis not present

## 2021-10-13 DIAGNOSIS — M4802 Spinal stenosis, cervical region: Secondary | ICD-10-CM | POA: Diagnosis not present

## 2021-10-13 DIAGNOSIS — F419 Anxiety disorder, unspecified: Secondary | ICD-10-CM | POA: Diagnosis not present

## 2021-10-13 LAB — BLOOD GAS, ARTERIAL
Acid-base deficit: 1 mmol/L (ref 0.0–2.0)
Bicarbonate: 22.2 mmol/L (ref 20.0–28.0)
FIO2: 37 %
O2 Content: 40 L/min
O2 Saturation: 99.2 %
Patient temperature: 37
pCO2 arterial: 32 mmHg (ref 32–48)
pH, Arterial: 7.45 (ref 7.35–7.45)
pO2, Arterial: 79 mmHg — ABNORMAL LOW (ref 83–108)

## 2021-10-13 LAB — CBC WITH DIFFERENTIAL/PLATELET
Abs Immature Granulocytes: 0.37 10*3/uL — ABNORMAL HIGH (ref 0.00–0.07)
Basophils Absolute: 0 10*3/uL (ref 0.0–0.1)
Basophils Relative: 0 %
Eosinophils Absolute: 0.3 10*3/uL (ref 0.0–0.5)
Eosinophils Relative: 2 %
HCT: 23.8 % — ABNORMAL LOW (ref 39.0–52.0)
Hemoglobin: 7.7 g/dL — ABNORMAL LOW (ref 13.0–17.0)
Immature Granulocytes: 3 %
Lymphocytes Relative: 10 %
Lymphs Abs: 1.3 10*3/uL (ref 0.7–4.0)
MCH: 30.7 pg (ref 26.0–34.0)
MCHC: 32.4 g/dL (ref 30.0–36.0)
MCV: 94.8 fL (ref 80.0–100.0)
Monocytes Absolute: 0.4 10*3/uL (ref 0.1–1.0)
Monocytes Relative: 3 %
Neutro Abs: 10.4 10*3/uL — ABNORMAL HIGH (ref 1.7–7.7)
Neutrophils Relative %: 82 %
Platelets: 160 10*3/uL (ref 150–400)
RBC: 2.51 MIL/uL — ABNORMAL LOW (ref 4.22–5.81)
RDW: 14.1 % (ref 11.5–15.5)
WBC: 12.8 10*3/uL — ABNORMAL HIGH (ref 4.0–10.5)
nRBC: 0 % (ref 0.0–0.2)

## 2021-10-13 LAB — COMPREHENSIVE METABOLIC PANEL
ALT: 80 U/L — ABNORMAL HIGH (ref 0–44)
AST: 56 U/L — ABNORMAL HIGH (ref 15–41)
Albumin: 2.3 g/dL — ABNORMAL LOW (ref 3.5–5.0)
Alkaline Phosphatase: 132 U/L — ABNORMAL HIGH (ref 38–126)
Anion gap: 4 — ABNORMAL LOW (ref 5–15)
BUN: 33 mg/dL — ABNORMAL HIGH (ref 8–23)
CO2: 22 mmol/L (ref 22–32)
Calcium: 7.3 mg/dL — ABNORMAL LOW (ref 8.9–10.3)
Chloride: 110 mmol/L (ref 98–111)
Creatinine, Ser: 0.64 mg/dL (ref 0.61–1.24)
GFR, Estimated: >60 mL/min (ref >60)
Glucose, Bld: 131 mg/dL — ABNORMAL HIGH (ref 70–99)
Potassium: 3.8 mmol/L (ref 3.5–5.1)
Sodium: 136 mmol/L (ref 135–145)
Total Bilirubin: 1 mg/dL (ref 0.3–1.2)
Total Protein: 5 g/dL — ABNORMAL LOW (ref 6.5–8.1)

## 2021-10-13 LAB — PROCALCITONIN: Procalcitonin: 0.7 ng/mL

## 2021-10-13 LAB — CULTURE, BLOOD (ROUTINE X 2)
Special Requests: ADEQUATE
Special Requests: ADEQUATE

## 2021-10-13 LAB — IRON AND TIBC
Iron: 27 ug/dL — ABNORMAL LOW (ref 45–182)
Saturation Ratios: 11 % — ABNORMAL LOW (ref 17.9–39.5)
TIBC: 242 ug/dL — ABNORMAL LOW (ref 250–450)
UIBC: 215 ug/dL

## 2021-10-13 LAB — RETICULOCYTES
Immature Retic Fract: 31.3 % — ABNORMAL HIGH (ref 2.3–15.9)
RBC.: 2.64 MIL/uL — ABNORMAL LOW (ref 4.22–5.81)
Retic Count, Absolute: 89.2 10*3/uL (ref 19.0–186.0)
Retic Ct Pct: 3.4 % — ABNORMAL HIGH (ref 0.4–3.1)

## 2021-10-13 LAB — PHOSPHORUS: Phosphorus: 3.2 mg/dL (ref 2.5–4.6)

## 2021-10-13 LAB — MAGNESIUM: Magnesium: 2.5 mg/dL — ABNORMAL HIGH (ref 1.7–2.4)

## 2021-10-13 LAB — GLUCOSE, CAPILLARY
Glucose-Capillary: 108 mg/dL — ABNORMAL HIGH (ref 70–99)
Glucose-Capillary: 109 mg/dL — ABNORMAL HIGH (ref 70–99)
Glucose-Capillary: 110 mg/dL — ABNORMAL HIGH (ref 70–99)
Glucose-Capillary: 88 mg/dL (ref 70–99)

## 2021-10-13 LAB — FOLATE: Folate: 17.5 ng/mL (ref >5.9)

## 2021-10-13 LAB — VITAMIN B12: Vitamin B-12: 763 pg/mL (ref 180–914)

## 2021-10-13 LAB — FERRITIN: Ferritin: 984 ng/mL — ABNORMAL HIGH (ref 24–336)

## 2021-10-13 MED ORDER — SODIUM CHLORIDE 0.9% FLUSH
10.0000 mL | Freq: Two times a day (BID) | INTRAVENOUS | Status: DC
  Administered 2021-10-13 – 2021-10-15 (×6): 10 mL
  Administered 2021-10-16: 20 mL

## 2021-10-13 MED ORDER — SODIUM CHLORIDE 0.9% IV SOLUTION: Freq: Once | INTRAVENOUS | Status: AC

## 2021-10-13 MED ORDER — VANCOMYCIN HCL 1250 MG/250ML IV SOLN
1250.0000 mg | Freq: Two times a day (BID) | INTRAVENOUS | Status: DC
  Administered 2021-10-13 – 2021-10-14 (×2): 1250 mg via INTRAVENOUS
  Filled 2021-10-13 (×3): qty 250

## 2021-10-13 MED ORDER — ATORVASTATIN CALCIUM 20 MG PO TABS
40.0000 mg | ORAL_TABLET | Freq: Every day | ORAL | Status: DC
  Administered 2021-10-13 – 2021-10-17 (×5): 40 mg via ORAL
  Filled 2021-10-13 (×5): qty 2

## 2021-10-13 MED ORDER — INSULIN ASPART 100 UNIT/ML IJ SOLN
0.0000 [IU] | INTRAMUSCULAR | Status: DC
  Administered 2021-10-15: 1 [IU] via SUBCUTANEOUS
  Filled 2021-10-13: qty 1

## 2021-10-13 MED ORDER — SODIUM CHLORIDE 0.9 % IV SOLN
3.0000 g | Freq: Four times a day (QID) | INTRAVENOUS | Status: AC
  Administered 2021-10-13 – 2021-10-16 (×13): 3 g via INTRAVENOUS
  Filled 2021-10-13 (×3): qty 3
  Filled 2021-10-13: qty 8
  Filled 2021-10-13: qty 3
  Filled 2021-10-13: qty 8
  Filled 2021-10-13: qty 3
  Filled 2021-10-13: qty 8
  Filled 2021-10-13: qty 3
  Filled 2021-10-13: qty 8
  Filled 2021-10-13: qty 3
  Filled 2021-10-13: qty 8
  Filled 2021-10-13: qty 3

## 2021-10-13 MED ORDER — ASPIRIN 81 MG PO CHEW: 81.0000 mg | CHEWABLE_TABLET | Freq: Every day | ORAL | Status: DC

## 2021-10-13 MED ORDER — ASPIRIN 300 MG RE SUPP
300.0000 mg | Freq: Every day | RECTAL | Status: DC
  Filled 2021-10-13: qty 1

## 2021-10-13 MED ORDER — IPRATROPIUM-ALBUTEROL 0.5-2.5 (3) MG/3ML IN SOLN
3.0000 mL | Freq: Three times a day (TID) | RESPIRATORY_TRACT | Status: DC
Start: 2021-10-13 — End: 2021-10-17
  Administered 2021-10-13 – 2021-10-17 (×12): 3 mL via RESPIRATORY_TRACT
  Filled 2021-10-13 (×13): qty 3

## 2021-10-13 MED ORDER — SODIUM CHLORIDE 0.9% FLUSH: 10.0000 mL | INTRAVENOUS | Status: DC | PRN

## 2021-10-13 MED ORDER — SODIUM CHLORIDE 0.9 % IV BOLUS
1000.0000 mL | Freq: Once | INTRAVENOUS | Status: AC
  Administered 2021-10-13: 1000 mL via INTRAVENOUS

## 2021-10-13 MED ORDER — ACETAMINOPHEN 500 MG PO TABS
1000.0000 mg | ORAL_TABLET | Freq: Once | ORAL | Status: AC
  Administered 2021-10-13: 1000 mg via ORAL
  Filled 2021-10-13: qty 2

## 2021-10-13 NOTE — Progress Notes (Signed)
Neurology progress note  S: Patient has wife and best friend at bedside and is in good spirits today. Slightly more movement LLE. No new neurologic sx. Developed fever yesterday and is currently being treated for aspiration PNA.   Interval data:  CTA H&N  1. No emergent large vessel occlusion, hemodynamically significant stenosis, aneurysm or dissection of the major cervical or intracranial arteries. 2. Numerous sclerotic lesions throughout the cervical spine, consistent with osseous metastatic disease. 3. Right middle lobe atelectasis and hazy opacities in the right upper lobe, possibly infection or asymmetric pulmonary edema. 4. Aortic Atherosclerosis (ICD10-I70.0).  Bubble study neg  BLE [place] - no e/o DVT      Component Value Date/Time   CHOL 125 01/29/2021 0822   TRIG 117 10/08/2021 0355   HDL 49 01/29/2021 0822   CHOLHDL 2.6 01/29/2021 0822   CHOLHDL 3.9 07/25/2020 1657   VLDL 15 07/25/2020 1657   LDLCALC 62 01/29/2021 0822   LDLDIRECT 62 10/10/2021 0455   LABVLDL 14 01/29/2021 0822    Lab Results  Component Value Date/Time   HGBA1C 5.6 10/10/2021 04:55 AM   Prior stroke workup  MRI finding on 8/15 of small acute ischemic R parietal infarct TTE 8/15 showed no e/o intracardiac clot   All CNS imaging personally reviewed  O:  Vitals:   10/13/21 2000 10/13/21 2025  BP: 117/67   Pulse: 77 89  Resp: (!) 21 (!) 27  Temp: 100.2 F (37.9 C) 98.7 F (37.1 C)  SpO2: 98% 97%   Physical Exam Gen: A&O x4, NAD Resp: normal WOB CV: RRR, no m/g/r; nml S1 and S2. 2+ symmetric peripheral pulses.   Neuro: *MS: A&O x4, follows multi-step commands *Speech: minimal dysarthria, no aphasia *CN: PERRLA, VFF by confrontation, optic discs unable to be visualized 2/2 pupillary constriction, EOMI w/o nystagmus, no ptosis, Sensation intact from V1 to V3 to LT, Eyelid closure was full.  Smile symmetric. Hearing intact to voice *Motor:   Decreased bulk.  No tremor, rigidity or  bradykinesia. No pronator drift.     Strength: Dlt Bic Tri WrE WrF FgS Gr HF KnF KnE PlF DoF    Left '3 2 2 2 2 2 2 2 '$ 0 0 2 2    Right '4 3 3 3 3 3 3 '$ 0 0 0 0 0      *Sensory: SILT BUE, impaired to all modalities BLE. No double-simultaneous extinction. *Coordination:  UTA 2/2 weakness *Reflexes:  1+ and symmetric BUE, areflexic BLE *Gait: UTA 2/2 weakness   NIHSS   1a Level of Conscious.: 0 1b LOC Questions: 0 1c LOC Commands: 0 2 Best Gaze: 0 3 Visual: 0 4 Facial Palsy: 0 5a Motor Arm - left: 2 5b Motor Arm - Right: 1 6a Motor Leg - Left: 3 6b Motor Leg - Right: 4 7 Limb Ataxia: 0 8 Sensory: 1 9 Best Language: 0 10 Dysarthria: 1 11 Extinct. and Inatten.: 0   TOTAL: 12     Premorbid mRS = 4  A/P: This is a 66 yo man who presented to ED with progressive neck and shoulder pain and some paresthesias of L hand. 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 Surgery was on 8/13. Palliative radiation therapy is planned to cervical spine. Neurology is requested to consult regarding MRI finding on 8/15 of small acute ischemic R parietal infarct.  His extremity exam  is highly limited 2/2 BLE>>BUE weakness and sensory deficit so it is not clear if he is symptomatic from this small stroke or not - I suspect it was an incidental finding. Etiology favored to be small vessel. While it is only a single acute infarct on MRI given his widespread cancer embolic source is a possibility 2/2 hypercoagulability 2/2 malignancy, however workup was negative for embolic source (incl TTE showed no intracardiac clot, bubble was negative, and BLE US showed no e/o DVT). His stroke workup is complete. He was initially prescribed aspirin '81mg'$  daily for secondary stroke prevention which is currently on hold 2/2 acute anemia of unknown etiology (Hgb 7.7 today, workup in  progress).  - Goal normotension >48 hrs out from MRI finding of acute infarct, strict avoidance of hypotension if at all possible - Aspirin currently on hold in the setting of worsening anemia - when safe to restart, please give ASA '81mg'$  po daily or '300mg'$  pr daily - Per neurosurgery, wait to start plavix until POD day 10 (8/23). If safe to restart at that time, plavix '75mg'$  daily should be continued for 13 days after that end date 21 days after MRI finding of acute infarct - Continue atorvastatin '40mg'$  daily - q4 hr neuro checks - STAT head CT for any change in neuro exam - Tele - PT/OT/[name] - Stroke education - Amb referral to neurology with Dr. Jennings Books placed for post-discharge clinic follow up - Signs and symptoms of stroke including focal weakness or numbness, difficulty speaking, dizziness (patient not ambulatory at baseline) reviewed with patient and his wife; they understand to call 911 immediately for any stroke-like sx after discharge  Will f/u in AM regarding possibility of restarting aspirin  Su Monks, MD [facility] [phone]  If 7pm- 7am, please page neurology on call as listed in [place].    Will continue to follow.

## 2021-10-13 NOTE — Progress Notes (Signed)
History: Jason [name] is s/p C6 laminectomy for resection of epidural mass  POD7:  Patient spiked fever.  Incision c/d/I.  Very low suspicion of SSI.  Likely aspiration pneumonia.   POD6:  No significant neurological changes. Patient subjectively feels hands are better.   POD5: no significant neurologic changes overnight, Continues to have anxiety.    POD4: continues to have weakness in his arms and legs.  He states he does have sensation in his legs however.  He also endorses significant anxiety regarding his recent metastatic cancer diagnosis   POD2: Pt was transferred to the ICU after yesterdays events. Pt remains intubated and sedated this morning. Wife at bedside.    POD1 PM: I was contacted to see Mr. Weekes at approximately 1615.  I saw him in his room at approximately 1620, and was able to review the clinical situation with his wife.  From review with her, it seems that Mr. Mantell was having some issues with his arms when she arrived at around 71, when he reported some difficulty feeding himself with lunch.  Per nursing, his nurses also noted that he was having some difficulty with movement of his arms and legs, but he improved with a fluid bolus around 1300.    When I saw him at 1620, he was arousable but not readily interactive with examination.  He moved his arms but was not reliable. He was noted to be significantly hypotensive.  ICU attending Dr. Tamala Julian, hospitalist Dr. Sheppard Coil, and I reviewed the case and all agreed transfer to ICU was appropriate.  This was done, and his blood pressure improved with another fluid bolus.   I then reevaluated him, noted below.   POD1 AM: Increased tingling in right hand this morning. Reports good pain control   Physical Exam:        Vitals:    10/11/21 0400 10/11/21 0826  BP: 124/76    Pulse: 89    Resp: (!) 24    Temp: 98.3 F (36.8 C)    SpO2: 93% 92%      AA Ox3  CNI 4/5 bilateral deltoids 3/5 left triceps and 2/5 left bicep  3/5 right triceps and bicep. 2/5 bilateral HG. 0/5 bilateral legs Incision intact with staples in place and open to air.    Data:       Last Labs            Recent Labs  Lab 10/09/21 0429 10/10/21 0455 10/11/21 0500  NA 136 136 136  K 4.1 3.8 3.7  CL 108 107 106  CO2 '23 24 23  '$ BUN 29* 46* 32*  CREATININE 0.69 0.82 0.51*  GLUCOSE 130* 143* 93  CALCIUM 8.2* 8.2* 7.8*       Last Labs        Recent Labs  Lab 10/11/21 0500  AST 121*  ALT 134*  ALKPHOS 170*         Last Labs            Recent Labs  Lab 10/09/21 0429 10/10/21 0455 10/11/21 0500  WBC 16.4* 15.3* 13.9*  HGB 10.0* 8.8* 9.2*  HCT 30.4* 26.5* 28.2*  PLT 137* 175 173       Last Labs   No results for input(s): "APTT", "INR" in the last 168 hours.              Other tests/results:  Pathology pending  MRI pending   Assessment/Plan:   MARS SCHEAFFER is a 66 y.o  s/p C6 laminectomy for resection of epidural mass.   - Appreciate ICU care and hospitalist care.  We have discussed maintaining normal BP with goal MAP>80 until 10/12/21 am - discontinue steroids  - Ok to leave incision open to air as long as it is not draining.  - Please call with any questions or concerns   Blanche East MD, PhD Department of Neurosurgery

## 2021-10-13 NOTE — Progress Notes (Signed)
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 Surgery was on 8/13. Palliative radiation therapy is planned to cervical spine. Neurology is requested to consult regarding MRI finding on 8/15 of small acute ischemic R parietal infarct (TTE 8/15 showed no e/o intracardiac clot but did not include bubble study.   s/p emergent C6 cervical laminectomy with resection of epidural spinal tumor.  He is POD 1 and having some worsening shock despite fluids.  Becoming more confused, lethargic so PCCM consulted.  POST OP RESP FAILURE AND EXTUBATED 8/15 Patient with very weak cough High risk for aspiration     Pertinent  Medical History  HTN Hypothyroidism Allergy GERD  Arthritis   Significant Hospital Events: Including procedures, antibiotic start and stop dates in addition to other pertinent events   8/12 admitted with chronic neck pain and worsening L hand paresthesias, L arm weakness 8/14  Pt transferred to ICU with hypotension along with significant weakness in upper and lower extremities.  Required mechanical intubation for MRI Cervical Spine due to severe claustrophobia  8/14: MR Cervical Spine revealed interval bilateral C6        hemilaminectomies. Similar degree of epidural tumor from          C4-C7 without definite superimposed epidural hematoma or        fluid collection. Progressive spinal canal stenosis from C4-C7,        severe at C5-C6 and C6-C7. No abnormal spinal cord signal or        cord infarct. Similar diffuse cervical  osseous metastatic disease. 8/15: Pt remains mechanically intubated.  Orders placed for MR Brain, Lumbar Spine, and Thoracic Spine to further assess etiology of acute BLE weakness/numbness.  Extubated later in day. 8/18 weak cough, alert and awake, agressive chest PT ordered 8/20 +fever,+sespsi due to aspiration pneumonia   Interim History / Subjective:  Off pressors +aspiration pneumonia High risk for re-intubation ABX started Chest PT, IS, flutter valve, placed on HHF at 65% Weaned down to 40% fio2 Alert and awake Plegic lower ext Weak cough reflex   Objective   Blood pressure 130/69, pulse 90, temperature 99 F (37.2 C), temperature source Rectal, resp. rate (!) 27, height '5\' 9"'$  (1.753 m), weight 91.3 kg, SpO2 97 %.    FiO2 (%):  [60 %] 60 %   Intake/Output Summary (Last 24 hours) at 10/13/2021 0756 Last data filed at 10/13/2021 0515 Gross per 24 hour  Intake 1179.87 ml  Output 3945 ml  Net -2765.13 ml    Filed Weights   10/04/21 1656 10/07/21 1645  Weight: 85 kg 91.3 kg    REVIEW OF SYSTEMS +weak cough +SOB All other ROS NEG   Physical Examination:   General Appearance: minimal distress  EYES PERRLA, EOM intact.   NECK Supple, No JVD Pulmonary: +rhonchi CardiovascularNormal S1,S2.  No m/r/g.   Abdomen: Benign, Soft, non-tender. Skin:   warm, no rashes, no ecchymosis  Extremities: normal, no cyanosis, clubbing. Neuro:plegic LE, b/l UE weakness 1/5 strength PSYCHIATRIC: Mood, affect within normal limits.   ALL OTHER ROS ARE NEGATIVE  Assessment & Plan:   66 yo WM with s/p emergent C6 cervical laminectomy with resection of epidural spinal tumor. worsening shock despite fluids and post op resp failure now extubation, + aspiration pneumonia progressive neuromuscular weakness with poor resp effort and with resp insufficiency  Severe ACUTE Hypoxic and Hypercapnic Respiratory Failure Extubated HHF at 40% - Intermittent chest x-ray & ABG PRN -  Ensure adequate pulmonary hygiene   RLL aspiration pneumonia This is Primary source of infection Blood cultures NOT needed and discontinued    shock SOURCE-spinal related to extensive metastatic disease, improved/resolving -use vasopressors to keep MAP>65 as needed  ONCOLOGY 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   Follow ONC and RAD ONC  NEUROLOGY Small R parietal stroke-  Continue PT/OT IS, flutter valve to strengthen resp muscles - Posterior cervical incision management  - Wean off dexamethasone to promote wound healing - Void trial - Eventual radiation therapy once surgical bed heals - Prozac and klonipin started for understandable anxiety issues - Graded pain management as ordered    OVERALL PROGNOSIS IS VERY POOR RECOMMEND DNR/DNI STATUS   DVT/GI PRX  assessed I Assessed the need for Labs I Assessed the need for Foley I Assessed the need for Central Venous Line Family Discussion when available I Assessed the need for Mobilization I made an Assessment of medications to be adjusted accordingly Safety Risk assessment completed  CASE DISCUSSED IN MULTIDISCIPLINARY ROUNDS WITH ICU TEAM     Critical Care Time devoted to patient care services described in this note is 55 minutes.  Critical care was necessary to treat /prevent imminent and life-threatening deterioration.   [name], M.D.  [facility]  Medical Director [place] Director [facility] Department

## 2021-10-13 NOTE — Progress Notes (Signed)
Physical Therapy Treatment Patient Details Name: Jason [name] MRN: [id number] DOB: 1955/04/26 Today's Date: 10/13/2021   History of Present Illness Patient is a 66 year old male with Metastatic disease involving retroperitoneal and pelvic lymphadenopathy, extensive spine and calcarial metastasis/epidural tumor cord compression disease.  Status post C6 laminectomy for resection of C6 epidural mass. Surgical pathology showed metastatic adenocarcinoma, compatible with prostatic primary. MRI of the brain shows calvarial metastatic disease most notably in the right temporal region and at the vertex, Additional extracranial extension of tumor into the scalp along the vertex, small focus of diffusion restriction in the right parietal cortex, possible acute infarct.    PT Comments    Pt is making progress towards goals with very motivated/optimistic attitude. Wife at bedside and very eager to learn on how to support patient in care. Stretching performed on B LE, noted no active movement, however light touch sensation intact. PRAFO boots donned and education given to wife on frequency including building up to 4hrs and then overnight use. When not in [facility], educated to wear moon boots. Pt on HFNC with O2 sats at 100% throughout session. Caregiver training on pressure relief including repositioning in bed. Pt would greatly benefit from CIR for further education/caregiver training to assist in relieving burden of care and optimizing quality of life. Would benefit from Co-treat with OT to optimize care.  Recommendations for follow up therapy are one component of a multi-disciplinary discharge planning process, led by the attending physician.  Recommendations may be updated based on patient status, additional functional criteria and insurance authorization.  Follow Up Recommendations  Acute inpatient rehab (3hours/day) (pending patient's goals of care)     Assistance Recommended at Discharge Frequent or  constant Supervision/Assistance  Patient can return home with the following Two people to help with walking and/or transfers;A lot of help with bathing/dressing/bathroom;Assistance with feeding;Direct supervision/assist for medications management;Direct supervision/assist for financial management;Assist for transportation;Help with stairs or ramp for entrance;Assistance with cooking/housework   Equipment Recommendations  Hospital bed;Wheelchair (measurements PT);Wheelchair cushion (measurements PT);BSC/3in1 (custom tilt in space, high back wheelchair)    Recommendations for Other Services       Precautions / Restrictions Precautions Precautions: Fall;Cervical Restrictions Weight Bearing Restrictions: No     Mobility  Bed Mobility               General bed mobility comments: Pt with increased faitgue levels and noted now on HFNC due to medical decline. OOB mobility not performed this date due to additional staff unable to assist. This therapist did put bed schedule for pressure relief including rolling to B sides for 30 mins each side. Updated RN    Transfers                   General transfer comment: not attempted    Ambulation/Gait                   Stairs             Wheelchair Mobility    Modified Rankin (Stroke Patients Only)       Balance                                            Cognition Arousal/Alertness: Awake/alert Behavior During Therapy: Flat affect Overall Cognitive Status: Within Functional Limits for tasks assessed  General Comments: emotional this date regarding chain of medical events. Becomes tearful discussing prognosis        Exercises Other Exercises Other Exercises: performed PROM on B LEs crossing over all joints. Would benefit from further stretching program and education to caregivers for carry over. Also placed in PRAFO boots for optimal  positioning along with bed controls set for rotation and pressure relief.    General Comments        Pertinent Vitals/Pain Pain Assessment Pain Assessment: No/denies pain    Home Living                          Prior Function            PT Goals (current goals can now be found in the care plan section) Acute Rehab PT Goals Patient Stated Goal: to get as much help as possible and get to rehab PT Goal Formulation: With patient/family Time For Goal Achievement: 10/24/21 Potential to Achieve Goals: Fair Progress towards PT goals: Progressing toward goals    Frequency    Min 3X/week      PT Plan Current plan remains appropriate    Co-evaluation              AM-PAC PT "6 Clicks" Mobility   Outcome Measure  Help needed turning from your back to your side while in a flat bed without using bedrails?: Total Help needed moving from lying on your back to sitting on the side of a flat bed without using bedrails?: Total Help needed moving to and from a bed to a chair (including a wheelchair)?: Total Help needed standing up from a chair using your arms (e.g., wheelchair or bedside chair)?: Total Help needed to walk in hospital room?: Total Help needed climbing 3-5 steps with a railing? : Total 6 Click Score: 6    End of Session Equipment Utilized During Treatment: Oxygen Activity Tolerance: Patient tolerated treatment well Patient left: in bed;with family/visitor present Nurse Communication: Mobility status PT Visit Diagnosis: Muscle weakness (generalized) (M62.81);Other abnormalities of gait and mobility (R26.89);Other symptoms and signs involving the nervous system (R29.898)     Time: 5643-3295 PT Time Calculation (min) (ACUTE ONLY): 30 min  Charges:  $Therapeutic Activity: 23-37 mins                     Greggory Stallion, PT, DPT, GCS [phone]    Tailynn Armetta 10/13/2021, 3:01 PM

## 2021-10-13 NOTE — Consult Note (Addendum)
Pharmacy Antibiotic Note  Jason [name] is a 66 y.o. male admitted on 10/04/2021 with sepsis.  Pharmacy has been consulted for Unasyn and Vancomycin dosing.  Plan: Switch from Cefepime 2g IV Q12H to Unasyn 3g Q6H  Adjusting Vancomycin to '1250mg'$  IV Q12H Goal AUC 400-550  Est AUC: 459.1 Est Cmax: 30.1 Est Cmin: 12.6 Calculated with SCr 0.8 (actual 0.64)   Height: '5\' 9"'$  (175.3 cm) Weight: 91.3 kg (201 lb 4.5 oz) IBW/kg (Calculated) : 70.7  Temp (24hrs), Avg:99.5 F (37.5 C), Min:97.9 F (36.6 C), Max:102 F (38.9 C)  Recent Labs  Lab 10/07/21 2117 10/07/21 2305 10/08/21 0355 10/08/21 0935 10/09/21 0429 10/10/21 0455 10/11/21 0500 10/12/21 0426 10/12/21 1948 10/12/21 2225 10/13/21 0159  WBC 13.9*  --    < >  --  16.4* 15.3* 13.9* 13.1*  --   --  12.8*  CREATININE 0.99  --    < >  --  0.69 0.82 0.51* 1.42*  --   --  0.64  LATICACIDVEN 1.5 2.8*  --  1.6  --   --   --   --  3.0* 1.6  --    < > = values in this interval not displayed.     Estimated Creatinine Clearance: 105.5 mL/min (by C-G formula based on SCr of 0.64 mg/dL).    No Known Allergies  Antimicrobials this admission: 8/13 Cefazolin >> 8/14 8/14 Cefepime + Vancomycin >> 8/15 8/19 Cefepime >> 8/20 8/19 Vancomycin >> 8/20 Unasyn >>    Microbiology results: 8/14 BCx: NG final 8/19 Bcx: canceled 8/14 UCx: NG final 8/14 MRSA PCR: Negative  Thank you for allowing pharmacy to be a part of this patient's care.  Gretel Acre, PharmD PGY1 Pharmacy Resident 10/13/2021 12:07 PM

## 2021-10-13 NOTE — Progress Notes (Addendum)
PROGRESS NOTE    Jason [name]  BOF:[id number] DOB: 27-Jan-1956 DOA: 10/04/2021 PCP: Default, Provider, MD     Brief Narrative:  66 year old WM PMHx essential HTN, unstable angina, PVC, PSVT, NSVT, hypothyroidism, GERD, BPH, prostate cancer metastatic to multiple sites,   Presented with neck/shoulder pain found to have widely metastatic cancer suspicious for prostate He was found to have widely metastatic prostate cancer including calvarial mets and epidural spinal tumor, now s/p emergent C6 cervical laminectomy for resection of the epidural tumor. Surgery was on 8/13. Palliative radiation therapy is planned to cervical spine. Neurology is requested to consult regarding MRI finding on 8/15 of small acute ischemic R parietal infarct (TTE 8/15 showed no e/o intracardiac clot but did not include bubble study.     s/p emergent C6 cervical laminectomy with resection of epidural spinal tumor.  He is POD 1 and having some worsening shock despite fluids.  Becoming more confused, lethargic so PCCM consulted.   POST OP RESP FAILURE AND EXTUBATED 8/15 Patient with very weak cough High risk for aspiration  Subjective: 8/20 Tmax overnight 38.9 C.  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 worsening shock despite fluids and post op resp failure now extubation, high risk for aspiration  Extensive metastasis epidural space, spine, calvarium, pelvis- with significant neurological deficits,  -8/13 s/p cervical tumor debulking and canal decompression -Per PCCM note patient to follow with oncology and radiation oncology. - 8/19 when more stable officially consult radiation oncology   Severe ACUTE Hypoxic and Hypercapnic Respiratory Failure -Extubated -Weak cough -High risk for aspiration and re-intubation -Chest PT okay by neuro -Incentive spirometry - Flutter valve - DuoNeb qid - 8/19 patient with cough not allowing him to fully clear chest.  Hypertonic saline neb BID -8/20 ABG pending  Sepsis pneumonia/Aspiration pneumonia -8/19 PCXR increase bibasilar atelectasis possible RLL aspiration -8/19 overnight started on broad-spectrum antibiotic - 8/20 n.p.o. - 8/20 swallow study - 8/20 sputum pending  Hypotension - 8/19 Midodrine 5 mg TID: Hold for SBP> 140 or DBP> 105 -8/19 Albumin 50 g x 1 -D5-0.9% saline 59m/hr -Strict in and out -2.2 L - Daily weight Filed Weights   10/04/21 1656 10/07/21 1645  Weight: 85 kg 91.3 kg  -MAP goal> 65  Shock -Most likely secondary to extensive metastatic disease -Vasopressors discontinued - Resolved  RIGHT parietal stroke - Neurology consulted -Posterior cervical incision management and MAP goal> 65 per NSGY -Wean off dexamethasone to promote wound healing.  Anemia unspecified - 8/20 anemia panel pending -8/20 occult blood pending -8/20 discussed case with Dr. CSu Monksneurology given patient's anemia will  hold off on starting aspirin until at least 8/21 to ensure hemoglobin stable Lab Results  Component  Value Date   HGB 7.7 (L) 10/13/2021   HGB 8.6 (L) 10/12/2021   HGB 9.2 (L) 10/11/2021   HGB 8.8 (L) 10/10/2021   HGB 10.0 (L) 10/09/2021  - 8/20 transfuse for hemoglobin<7   Anxiety - Clonazepam 0.5 mg BID - Prozac 20 mg daily      Mobility Assessment (last 72 hours)     Mobility Assessment     Row Name 10/12/21 2000 10/11/21 1535 10/11/21 1500 10/11/21 0000 10/10/21 2100   Does patient have an order for bedrest or is patient medically unstable Yes- Bedfast (Level 1) - Complete -- -- Yes- Bedfast (Level 1) - Complete Yes- Bedfast (Level 1) - Complete   What is the highest level of mobility based on the progressive mobility assessment? Level 1 (Bedfast) - Unable to balance while sitting on edge of bed Level 1 (Bedfast) - Unable to balance while sitting on edge of bed Level 2 (Chairfast) - Balance while sitting on edge of bed and cannot stand -- --    Row Name 10/10/21 1324 10/10/21 1100 10/10/21 0900       Does patient have an order for bedrest or is patient medically unstable -- -- Yes- Bedfast (Level 1) - Complete     What is the highest level of mobility based on the progressive mobility assessment? Level 1 (Bedfast) - Unable to balance while sitting on edge of bed Level 1 (Bedfast) - Unable to balance while sitting on edge of bed Level 1 (Bedfast) - Unable to balance while sitting on edge of bed             Goals of care - 8/19 Palliative Care consult.  Patient with extensive metastatic prostate cancer.  Discuss change of CODE STATUS to DNR.  Discussed short-term vs long-term goals of care to include palliative care, hospice.          DVT prophylaxis: Lovenox Code Status: Full Family Communication: 8/20 wife at bedside for discussion of plan of care all questions answered Status is: Inpatient    Dispo: The patient is from: Home              Anticipated d/c is to: SNF              Anticipated d/c date is: > 3 days              Patient currently is not medically  stable to d/c.      Consultants:  PCCM Dr. Mortimer Fries Neurosurgery Dr. Valli Glance Neurology Dr. Su Monks Oncology Dr. Altha Harm   Procedures/Significant Events:  8/12 admitted with chronic neck pain and worsening L hand paresthesias, L arm weakness 8/14  Pt transferred to ICU with hypotension along with significant weakness in upper and lower extremities.  Required mechanical intubation for MRI Cervical Spine due to severe claustrophobia  8/14: MR Cervical Spine revealed interval bilateral C6        hemilaminectomies. Similar degree of epidural tumor from          C4-C7 without definite superimposed epidural hematoma or        fluid collection. Progressive spinal canal stenosis from C4-C7,        severe at C5-C6 and C6-C7. No abnormal spinal cord signal or        cord infarct. Similar diffuse cervical osseous metastatic disease. 8/15: Pt remains mechanically intubated.  Orders placed  for MR Brain, Lumbar Spine, and Thoracic Spine to further assess etiology of acute BLE weakness/numbness.  Extubated later in day. 8/18 weak cough, alert and awake, agressive chest PT ordered 8/19 KUB negative ileus or SBO 8/19 PCXRPulmonary hypoinflation. Bibasilar atelectasis.  RIGHT>> LEFT (appears possible aspiration my read)   I have personally reviewed and interpreted all radiology studies and my findings are as above.   VENTILATOR SETTINGS: HFNC 8/20 Flow 40 L/min FiO2 60% SPO2 100%   Cultures 8/14 urine negative 8/14 blood RIGHT hand negative 8/14 blood LEFT hand negative 8/20 blood pending 8/20 sputum pending    Antimicrobials: Anti-infectives (From admission, onward)    Start     Ordered Stop   10/13/21 2130  vancomycin (VANCOREADY) IVPB 1500 mg/300 mL  Status:  Discontinued       See Hyperspace for full Linked Orders Report.   10/12/21 2038 10/13/21 1144   10/13/21 1800  Ampicillin-Sulbactam (UNASYN) 3 g in sodium chloride 0.9 % 100 mL IVPB        10/13/21 1210      10/13/21 1400  vancomycin (VANCOREADY) IVPB 1250 mg/250 mL       See Hyperspace for full Linked Orders Report.   10/13/21 1144     10/12/21 2200  ceFEPIme (MAXIPIME) 2 g in sodium chloride 0.9 % 100 mL IVPB  Status:  Discontinued        10/12/21 2038 10/13/21 1114   10/12/21 2130  vancomycin (VANCOREADY) IVPB 2000 mg/400 mL       See Hyperspace for full Linked Orders Report.   10/12/21 2038 10/12/21 2309   10/08/21 1000  vancomycin (VANCOCIN) IVPB 1000 mg/200 mL premix  Status:  Discontinued        10/07/21 1917 10/08/21 0935   10/07/21 2200  ceFEPIme (MAXIPIME) 2 g in sodium chloride 0.9 % 100 mL IVPB  Status:  Discontinued        10/07/21 1701 10/08/21 0935   10/07/21 1800  vancomycin (VANCOREADY) IVPB 2000 mg/400 mL        10/07/21 1701 10/07/21 2300   10/06/21 2330  ceFAZolin (ANCEF) IVPB 2g/100 mL premix        10/06/21 2231 10/07/21 0049   10/06/21 0736  ceFAZolin (ANCEF) 2-4 GM/100ML-% IVPB       Note to Pharmacy: Mirna Mires: cabinet override   10/06/21 0736 10/06/21 1944        Devices    LINES / TUBES:      Continuous Infusions:  sodium chloride Stopped (10/12/21 2001)   ceFEPime (MAXIPIME) IV Stopped (10/12/21 2226)   dextrose 5 % and 0.9% NaCl 75 mL/hr at 10/13/21 0515   vancomycin       Objective: Vitals:   10/13/21 0400 10/13/21 0415 10/13/21 0430 10/13/21 0700  BP: 104/60 (!) 108/58 108/64 130/69  Pulse: 70 69 79 90  Resp: '17 17 18 '$ (!) 27  Temp: 98.4 F (36.9 C) 98.4 F (36.9 C) 98.6 F (37 C) 99 F (37.2 C)  TempSrc: Rectal   Rectal  SpO2: 100% 99% 100% 97%  Weight:      Height:        Intake/Output Summary (Last 24 hours) at 10/13/2021 0756 Last data filed at 10/13/2021 0515 Gross per 24 hour  Intake 1179.87 ml  Output 3945 ml  Net -2765.13 ml    Filed Weights   10/04/21 1656 10/07/21 1645  Weight: 85 kg 91.3 kg    Examination:  General: A/O x4, positive acute respiratory distress  Eyes: negative scleral hemorrhage,  negative anisocoria, negative icterus ENT: Negative Runny nose, negative gingival bleeding, Neck:  Negative scars, masses, torticollis, lymphadenopathy, JVD Lungs: tachypnea diffuse rhonchi bilaterally Right> LEFT, positive mild expiratory wheezes, negative Cardiovascular: Sinus tachycardia without murmur gallop or rub normal S1 and S2 Abdomen: negative abdominal pain, nondistended, positive soft, bowel sounds, no rebound, no ascites, no appreciable mass Extremities: No significant cyanosis, clubbing, or edema bilateral lower extremities Skin: Negative rashes, lesions, ulcers Psychiatric:  Negative depression, negative anxiety, negative fatigue, negative mania  Central nervous system:  Cranial nerves II through XII intact, tongue/uvula midline, bilateral UE strength 4/5 in shoulders and elbows bilaterally, 2/5 bilateral hand grip, bilateral lower extremity strength 1/5, sensation intact throughout, negative dysarthria, negative expressive aphasia, negative receptive aphasia.  .     Data Reviewed: Care during the described time interval was provided by me .  I have reviewed this patient's available data, including medical history, events of note, physical examination, and all test results as part of my evaluation.  CBC: Recent Labs  Lab 10/09/21 0429 10/10/21 0455 10/11/21 0500 10/12/21 0426 10/13/21 0159  WBC 16.4* 15.3* 13.9* 13.1* 12.8*  NEUTROABS  --   --   --   --  10.4*  HGB 10.0* 8.8* 9.2* 8.6* 7.7*  HCT 30.4* 26.5* 28.2* 25.9* 23.8*  MCV 93.3 93.0 93.4 92.2 94.8  PLT 137* 175 173 170 517    Basic Metabolic Panel: Recent Labs  Lab 10/08/21 0355 10/09/21 0429 10/10/21 0455 10/11/21 0500 10/12/21 0426 10/13/21 0159  NA 135 136 136 136 133* 136  K 4.0 4.1 3.8 3.7 4.4 3.8  CL 103 108 107 106 104 110  CO2 '22 23 24 23 22 22  '$ GLUCOSE 163* 130* 143* 93 108* 131*  BUN 26* 29* 46* 32* 55* 33*  CREATININE 0.84 0.69 0.82 0.51* 1.42* 0.64  CALCIUM 8.3* 8.2* 8.2* 7.8* 7.6*  7.3*  MG 2.2 2.6* 2.6*  --  2.6* 2.5*  PHOS 4.5 3.3 3.0  --  4.8* 3.2    GFR: Estimated Creatinine Clearance: 105.5 mL/min (by C-G formula based on SCr of 0.64 mg/dL). Liver Function Tests: Recent Labs  Lab 10/09/21 0429 10/10/21 0455 10/11/21 0500 10/12/21 0426 10/13/21 0159  AST 42* 69* 121* 70* 56*  ALT 22 51* 134* 101* 80*  ALKPHOS 202* 171* 170* 150* 132*  BILITOT 1.4* 1.2 1.4* 1.2 1.0  PROT 6.0* 5.8* 5.8* 5.4* 5.0*  ALBUMIN 3.0* 2.8* 2.8* 2.5* 2.3*    No results for input(s): "LIPASE", "AMYLASE" in the last 168 hours. No results for input(s): "AMMONIA" in the last 168 hours. Coagulation Profile: No results for input(s): "INR", "PROTIME" in the last 168 hours. Cardiac Enzymes: No results for input(s): "CKTOTAL", "CKMB", "CKMBINDEX", "TROPONINI" in the last 168 hours. BNP (last 3 results) No results for input(s): "PROBNP" in the last 8760 hours. HbA1C: No results for input(s): "HGBA1C" in the last 72 hours.  CBG: Recent Labs  Lab 10/11/21 2202 10/12/21 0748 10/12/21 1134 10/12/21 1602 10/12/21 2205  GLUCAP 103* 80 98 129* 121*    Lipid Profile: No results for input(s): "CHOL", "HDL", "LDLCALC", "TRIG", "CHOLHDL", "LDLDIRECT" in the last 72 hours.  Thyroid Function Tests: No results for input(s): "TSH", "T4TOTAL", "FREET4", "T3FREE", "THYROIDAB" in the last 72 hours. Anemia Panel: No results for input(s): "VITAMINB12", "FOLATE", "FERRITIN", "TIBC", "IRON", "RETICCTPCT" in the last 72 hours. Sepsis Labs: Recent Labs  Lab 10/07/21 0308 10/07/21 2117 10/07/21 2305 10/08/21 0935 10/12/21 1948 10/12/21 2225 10/13/21 0159  PROCALCITON <0.10  --   --   --  0.88  --  0.70  LATICACIDVEN  --    < > 2.8* 1.6 3.0* 1.6  --    < > = values in this interval not displayed.     Recent Results (from the past 240 hour(s))  MRSA Next Gen by PCR, Nasal     Status: None   Collection Time: 10/07/21  4:54 PM   Specimen: Nasal Mucosa; Nasal Swab  Result Value Ref  Range Status   MRSA by PCR Next Gen NOT DETECTED NOT DETECTED Final    Comment: (NOTE) The GeneXpert MRSA Assay (FDA approved for NASAL specimens only), is one component of a comprehensive MRSA colonization surveillance program. It is not intended to diagnose MRSA infection nor to guide or monitor treatment for MRSA infections. Test performance is not FDA approved in patients less than 41 years old. Performed at [facility], [address], [place], [place] [address]   Urine Culture     Status: None   Collection Time: 10/07/21  9:17 PM   Specimen: Urine, Random  Result Value Ref Range Status   Specimen Description   Final    URINE, RANDOM Performed at [facility], [address] [place], [place] [address]    Special Requests   Final    NONE Performed at [facility], [address] [place], [place] [address]    Culture   Final    NO GROWTH Performed at [facility], [place] [address], [place], [place] [address]    Report Status 10/09/2021 FINAL  Final  Culture, blood (Routine X 2) w Reflex to ID Panel     Status: None   Collection Time: 10/07/21  9:18 PM   Specimen: BLOOD  Result Value Ref Range Status   Specimen Description BLOOD BLOOD RIGHT HAND  Final   Special Requests   Final    BOTTLES DRAWN AEROBIC AND ANAEROBIC Blood Culture results may not be optimal due to an inadequate volume of blood received in culture bottles   Culture   Final    NO GROWTH 5 DAYS Performed at [facility], [address] [place], [place] [address]    Report Status 10/12/2021 FINAL  Final  Culture, blood (Routine X 2) w Reflex to ID Panel     Status: None   Collection Time: 10/07/21  9:29 PM   Specimen: BLOOD  Result Value Ref Range Status   Specimen Description BLOOD BLOOD LEFT HAND  Final   Special Requests   Final    BOTTLES DRAWN AEROBIC AND ANAEROBIC Blood Culture results may not be optimal due to an inadequate volume of blood received in  culture bottles   Culture   Final    NO GROWTH 5 DAYS Performed at [facility], [address], [place], [place] [address]    Report Status 10/12/2021 FINAL  Final  Culture, blood (Routine X 2) w Reflex to ID Panel     Status: None (Preliminary result)   Collection Time: 10/12/21  7:48 PM   Specimen: BLOOD RIGHT FOREARM  Result Value Ref Range Status   Specimen Description BLOOD RIGHT FOREARM  Final   Special Requests   Final    BOTTLES DRAWN AEROBIC AND ANAEROBIC Blood Culture adequate volume   Culture   Final    NO GROWTH < 12 HOURS Performed at [facility], [address] [place], [place] [address]  Report Status PENDING  Incomplete  Culture, blood (Routine X 2) w Reflex to ID Panel     Status: None (Preliminary result)   Collection Time: 10/12/21  7:54 PM   Specimen: BLOOD RIGHT HAND  Result Value Ref Range Status   Specimen Description BLOOD RIGHT HAND  Final   Special Requests   Final    BOTTLES DRAWN AEROBIC AND ANAEROBIC Blood Culture adequate volume   Culture   Final    NO GROWTH < 12 HOURS Performed at [facility], [address], [place], [place] [address]    Report Status PENDING  Incomplete         Radiology Studies: [place] EKG SITE RITE  Result Date: 10/13/2021 If Site Rite image not attached, placement could not be confirmed due to current cardiac rhythm.  DG Chest Port 1 View  Result Date: 10/12/2021 CLINICAL DATA:  Sepsis, fever EXAM: PORTABLE CHEST 1 VIEW COMPARISON:  10/11/2021 FINDINGS: Lung volumes are small with asymmetric right-sided volume loss, unchanged from prior examination. Bibasilar atelectasis noted. No pneumothorax or pleural effusion. Cardiac size is within normal limits. Left internal jugular central venous catheter tip noted at the superior cavoatrial junction. The thoracic aorta is ectatic, similar to prior examination and likely accentuated by poor pulmonary insufflation. Pulmonary vascularity is normal.  Left total shoulder arthroplasty has been performed. IMPRESSION: 1. Pulmonary hypoinflation. 2. Bibasilar atelectasis. Electronically Signed   By: Fidela Salisbury M.D.   On: 10/12/2021 20:08   DG Abd 1 View  Result Date: 10/12/2021 CLINICAL DATA:  Constipation EXAM: ABDOMEN - 1 VIEW COMPARISON:  10/07/2021 FINDINGS: No bowel dilatation to suggest obstruction. No evidence of pneumoperitoneum, portal venous gas or pneumatosis. No pathologic calcifications along the expected course of the ureters. No acute osseous abnormality. IMPRESSION: Negative. Electronically Signed   By: Kathreen Devoid M.D.   On: 10/12/2021 17:49   US Venous Img Lower Bilateral (DVT)  Result Date: 10/11/2021 CLINICAL DATA:  CVA. EXAM: BILATERAL LOWER EXTREMITY VENOUS DOPPLER ULTRASOUND TECHNIQUE: Gray-scale sonography with graded compression, as well as color Doppler and duplex ultrasound were performed to evaluate the lower extremity deep venous systems from the level of the common femoral vein and including the common femoral, femoral, profunda femoral, popliteal and calf veins including the posterior tibial, peroneal and gastrocnemius veins when visible. The superficial great saphenous vein was also interrogated. Spectral Doppler was utilized to evaluate flow at rest and with distal augmentation maneuvers in the common femoral, femoral and popliteal veins. COMPARISON:  None Available. FINDINGS: RIGHT LOWER EXTREMITY Common Femoral Vein: No evidence of thrombus. Normal compressibility, respiratory phasicity and response to augmentation. Saphenofemoral Junction: No evidence of thrombus. Normal compressibility and flow on color Doppler imaging. Profunda Femoral Vein: No evidence of thrombus. Normal compressibility and flow on color Doppler imaging. Femoral Vein: No evidence of thrombus. Normal compressibility, respiratory phasicity and response to augmentation. Popliteal Vein: No evidence of thrombus. Normal compressibility, respiratory  phasicity and response to augmentation. Calf Veins: No evidence of thrombus. Normal compressibility and flow on color Doppler imaging. Superficial Great Saphenous Vein: No evidence of thrombus. Normal compressibility. Venous Reflux:  None. Other Findings: No evidence of superficial thrombophlebitis or abnormal fluid collection. LEFT LOWER EXTREMITY Common Femoral Vein: No evidence of thrombus. Normal compressibility, respiratory phasicity and response to augmentation. Saphenofemoral Junction: No evidence of thrombus. Normal compressibility and flow on color Doppler imaging. Profunda Femoral Vein: No evidence of thrombus. Normal compressibility and flow on color Doppler imaging. Femoral Vein: No evidence of  thrombus. Normal compressibility, respiratory phasicity and response to augmentation. Popliteal Vein: No evidence of thrombus. Normal compressibility, respiratory phasicity and response to augmentation. Calf Veins: No evidence of thrombus. Normal compressibility and flow on color Doppler imaging. Superficial Great Saphenous Vein: No evidence of thrombus. Normal compressibility. Venous Reflux:  None. Other Findings: No evidence of superficial thrombophlebitis or abnormal fluid collection. IMPRESSION: No evidence of deep venous thrombosis in either lower extremity. Electronically Signed   By: Aletta Edouard M.D.   On: 10/11/2021 16:57   ECHOCARDIOGRAM LIMITED BUBBLE STUDY  Result Date: 10/11/2021    ECHOCARDIOGRAM LIMITED REPORT   Patient Name:   Jason [name] Date of Exam: 10/11/2021 Medical Rec #:  [id number]        Height:       69.0 in Accession #:    [id number]       Weight:       201.3 lb Date of Birth:  1955-04-07        BSA:          2.072 m Patient Age:    24 years         BP:           140/75 mmHg Patient Gender: M                HR:           103 bpm. Exam Location:  [facility] Procedure: Limited Echo and Saline Contrast Bubble Study Indications:     I63.9 Stroke  History:         Patient has prior  history of Echocardiogram examinations, most                  recent 10/08/2021. Risk Factors:Hypertension.  Sonographer:     Charmayne Sheer Referring Phys:  [place] Diagnosing Phys: Ida Rogue MD IMPRESSIONS  1. Left ventricular ejection fraction, by estimation, is 60 to 65%. The left ventricle has normal function. The left ventricle has no regional wall motion abnormalities. Left ventricular diastolic parameters are indeterminate.  2. Right ventricular systolic function is normal. The right ventricular size is normal  3. Agitated saline contrast bubble study was negative, with no evidence of any interatrial shun  4. Limited study FINDINGS  Left Ventricle: Left ventricular ejection fraction, by estimation, is 60 to 65%. The left ventricle has normal function. The left ventricle has no regional wall motion abnormalities. The left ventricular internal cavity size was normal in size. There is  no left ventricular hypertrophy. Left ventricular diastolic parameters are indeterminate. Right Ventricle: The right ventricular size is normal. No increase in right ventricular wall thickness. Right ventricular systolic function is normal. Left Atrium: Left atrial size was normal in size. Right Atrium: Right atrial size was normal in size. Pericardium: There is no evidence of pericardial effusion. Mitral Valve: The mitral valve was not well visualized. No evidence of mitral valve stenosis. Tricuspid Valve: The tricuspid valve is not well visualized. Tricuspid valve regurgitation is not demonstrated. No evidence of tricuspid stenosis. Aortic Valve: The aortic valve was not well visualized. Aortic valve regurgitation is not visualized. No aortic stenosis is present. Pulmonic Valve: The pulmonic valve was not well visualized. Pulmonic valve regurgitation is not visualized. No evidence of pulmonic stenosis. Aorta: The aortic root was not well visualized. Venous: The pulmonary veins were not well visualized. The  inferior vena cava was not well visualized. IAS/Shunts: No atrial level shunt detected by color flow Doppler. Agitated saline contrast  was given intravenously to evaluate for intracardiac shunting. Agitated saline contrast bubble study was negative, with no evidence of any interatrial shunt. Ida Rogue MD Electronically signed by Ida Rogue MD Signature Date/Time: 10/11/2021/11:41:14 AM    Final    DG Chest Port 1 View  Result Date: 10/11/2021 CLINICAL DATA:  Respiratory failure.  Metastatic prostate cancer. EXAM: PORTABLE CHEST 1 VIEW COMPARISON:  Chest x-ray dated 10/09/2021. FINDINGS: Cardiomediastinal silhouette is stable in size and configuration. LEFT-sided PICC line is stable in position with tip at the level of the lower SVC/cavoatrial junction. Study is hypoinspiratory with crowding of the bibasilar markings, RIGHT greater than LEFT. Lungs appear clear. No pleural effusion or pneumothorax is seen. IMPRESSION: Low lung volumes. No evidence of pneumonia or pulmonary edema. Electronically Signed   By: Franki Cabot M.D.   On: 10/11/2021 08:09        Scheduled Meds:  sodium chloride   Intravenous Once   acetaminophen  1,000 mg Oral Once   ascorbic acid  500 mg Oral Daily   budesonide (PULMICORT) nebulizer solution  0.5 mg Nebulization BID   Chlorhexidine Gluconate Cloth  6 each Topical Q0600   clonazePAM  0.5 mg Oral BID   dextromethorphan-guaiFENesin  1 tablet Oral BID   docusate sodium  100 mg Oral BID   enoxaparin (LOVENOX) injection  40 mg Subcutaneous Q24H   famotidine  20 mg Oral Daily   feeding supplement  237 mL Oral TID BM   FLUoxetine  20 mg Oral QHS   insulin aspart  0-5 Units Subcutaneous QHS   insulin aspart  0-9 Units Subcutaneous TID WC   ipratropium-albuterol  3 mL Nebulization QID   levothyroxine  88 mcg Oral Q0600   loratadine  10 mg Oral BID   meloxicam  15 mg Oral Daily   midodrine  5 mg Oral TID WC   multivitamin with minerals  1 tablet Oral Daily    polyethylene glycol  17 g Oral Daily   potassium chloride  10 mEq Oral QHS   riluzole  50 mg Oral Q12H   rosuvastatin  5 mg Oral Daily   sodium chloride flush  10-40 mL Intracatheter Q12H   sodium chloride HYPERTONIC  4 mL Nebulization BID   sorbitol, milk of mag, mineral oil, glycerin (SMOG) enema  400 mL Rectal Once   tamsulosin  0.4 mg Oral Daily   Continuous Infusions:  sodium chloride Stopped (10/12/21 2001)   ceFEPime (MAXIPIME) IV Stopped (10/12/21 2226)   dextrose 5 % and 0.9% NaCl 75 mL/hr at 10/13/21 0515   vancomycin       LOS: 8 days    Time spent:40 min    Torri Michalski, Geraldo Docker, MD [facility]   If 7PM-7AM, please contact night-coverage 10/13/2021, 7:56 AM

## 2021-10-14 DIAGNOSIS — M4802 Spinal stenosis, cervical region: Secondary | ICD-10-CM | POA: Diagnosis not present

## 2021-10-14 LAB — COMPREHENSIVE METABOLIC PANEL
ALT: 125 U/L — ABNORMAL HIGH (ref 0–44)
AST: 102 U/L — ABNORMAL HIGH (ref 15–41)
Albumin: 2.7 g/dL — ABNORMAL LOW (ref 3.5–5.0)
Alkaline Phosphatase: 165 U/L — ABNORMAL HIGH (ref 38–126)
Anion gap: 4 — ABNORMAL LOW (ref 5–15)
BUN: 20 mg/dL (ref 8–23)
CO2: 21 mmol/L — ABNORMAL LOW (ref 22–32)
Calcium: 8.2 mg/dL — ABNORMAL LOW (ref 8.9–10.3)
Chloride: 114 mmol/L — ABNORMAL HIGH (ref 98–111)
Creatinine, Ser: 0.55 mg/dL — ABNORMAL LOW (ref 0.61–1.24)
GFR, Estimated: >60 mL/min (ref >60)
Glucose, Bld: 102 mg/dL — ABNORMAL HIGH (ref 70–99)
Potassium: 4.2 mmol/L (ref 3.5–5.1)
Sodium: 139 mmol/L (ref 135–145)
Total Bilirubin: 0.9 mg/dL (ref 0.3–1.2)
Total Protein: 5.3 g/dL — ABNORMAL LOW (ref 6.5–8.1)

## 2021-10-14 LAB — CBC WITH DIFFERENTIAL/PLATELET
Abs Immature Granulocytes: 0.38 10*3/uL — ABNORMAL HIGH (ref 0.00–0.07)
Basophils Absolute: 0 10*3/uL (ref 0.0–0.1)
Basophils Relative: 0 %
Eosinophils Absolute: 0.6 10*3/uL — ABNORMAL HIGH (ref 0.0–0.5)
Eosinophils Relative: 5 %
HCT: 23.5 % — ABNORMAL LOW (ref 39.0–52.0)
Hemoglobin: 7.6 g/dL — ABNORMAL LOW (ref 13.0–17.0)
Immature Granulocytes: 3 %
Lymphocytes Relative: 14 %
Lymphs Abs: 1.7 10*3/uL (ref 0.7–4.0)
MCH: 31 pg (ref 26.0–34.0)
MCHC: 32.3 g/dL (ref 30.0–36.0)
MCV: 95.9 fL (ref 80.0–100.0)
Monocytes Absolute: 0.5 10*3/uL (ref 0.1–1.0)
Monocytes Relative: 4 %
Neutro Abs: 8.8 10*3/uL — ABNORMAL HIGH (ref 1.7–7.7)
Neutrophils Relative %: 74 %
Platelets: 200 10*3/uL (ref 150–400)
RBC: 2.45 MIL/uL — ABNORMAL LOW (ref 4.22–5.81)
RDW: 14.1 % (ref 11.5–15.5)
WBC: 12.1 10*3/uL — ABNORMAL HIGH (ref 4.0–10.5)
nRBC: 0 % (ref 0.0–0.2)

## 2021-10-14 LAB — GLUCOSE, CAPILLARY
Glucose-Capillary: 90 mg/dL (ref 70–99)
Glucose-Capillary: 105 mg/dL — ABNORMAL HIGH (ref 70–99)
Glucose-Capillary: 107 mg/dL — ABNORMAL HIGH (ref 70–99)
Glucose-Capillary: 99 mg/dL (ref 70–99)
Glucose-Capillary: 87 mg/dL (ref 70–99)
Glucose-Capillary: 127 mg/dL — ABNORMAL HIGH (ref 70–99)
Glucose-Capillary: 110 mg/dL — ABNORMAL HIGH (ref 70–99)

## 2021-10-14 LAB — OCCULT BLOOD X 1 CARD TO LAB, STOOL: Fecal Occult Bld: NEGATIVE

## 2021-10-14 LAB — MAGNESIUM: Magnesium: 2.4 mg/dL (ref 1.7–2.4)

## 2021-10-14 LAB — PHOSPHORUS: Phosphorus: 3 mg/dL (ref 2.5–4.6)

## 2021-10-14 LAB — PROCALCITONIN: Procalcitonin: 0.37 ng/mL

## 2021-10-14 MED ORDER — ENSURE ENLIVE PO LIQD
237.0000 mL | Freq: Three times a day (TID) | ORAL | Status: DC
  Administered 2021-10-14 – 2021-10-16 (×3): 237 mL via ORAL

## 2021-10-14 MED ORDER — CHLORHEXIDINE GLUCONATE CLOTH 2 % EX PADS
6.0000 | MEDICATED_PAD | Freq: Every day | CUTANEOUS | Status: DC
  Administered 2021-10-14 – 2021-10-17 (×4): 6 via TOPICAL

## 2021-10-14 NOTE — Progress Notes (Signed)
History: Jason [name] is s/p C6 laminectomy for resection of epidural mass  POD8: pt reports some improved breathing overnight  POD7:  Patient spiked fever.  Incision c/d/I.  Very low suspicion of SSI.  Likely aspiration pneumonia.   POD6:  No significant neurological changes. Patient subjectively feels hands are better.   POD5: no significant neurologic changes overnight, Continues to have anxiety.    POD4: continues to have weakness in his arms and legs.  He states he does have sensation in his legs however.  He also endorses significant anxiety regarding his recent metastatic cancer diagnosis   POD2: Pt was transferred to the ICU after yesterdays events. Pt remains intubated and sedated this morning. Wife at bedside.    POD1 PM: I was contacted to see Jason [name] at approximately 1615.  I saw him in his room at approximately 1620, and was able to review the clinical situation with his wife.  From review with her, it seems that Jason [name] was having some issues with his arms when she arrived at around 37, when he reported some difficulty feeding himself with lunch.  Per nursing, his nurses also noted that he was having some difficulty with movement of his arms and legs, but he improved with a fluid bolus around 1300.    When I saw him at 1620, he was arousable but not readily interactive with examination.  He moved his arms but was not reliable. He was noted to be significantly hypotensive.  ICU attending Jason [name], hospitalist Dr. Sheppard Coil, and I reviewed the case and all agreed transfer to ICU was appropriate.  This was done, and his blood pressure improved with another fluid bolus.   I then reevaluated him, noted below.   POD1 AM: Increased tingling in right hand this morning. Reports good pain control   Physical Exam:        Vitals:    10/11/21 0400 10/11/21 0826  BP: 124/76    Pulse: 89    Resp: (!) 24    Temp: 98.3 F (36.8 C)    SpO2: 93% 92%      AA Ox3  CNI 4/5  bilateral deltoids 3/5 left triceps and 2/5 left bicep 3/5 right triceps and bicep. 2/5 bilateral HG. 1/5 left quad otherwise 0/5 bilateral legs Incision intact with staples in place and open to air.    Data:       Last Labs            Recent Labs  Lab 10/09/21 0429 10/10/21 0455 10/11/21 0500  NA 136 136 136  K 4.1 3.8 3.7  CL 108 107 106  CO2 '23 24 23  '$ BUN 29* 46* 32*  CREATININE 0.69 0.82 0.51*  GLUCOSE 130* 143* 93  CALCIUM 8.2* 8.2* 7.8*       Last Labs        Recent Labs  Lab 10/11/21 0500  AST 121*  ALT 134*  ALKPHOS 170*         Last Labs            Recent Labs  Lab 10/09/21 0429 10/10/21 0455 10/11/21 0500  WBC 16.4* 15.3* 13.9*  HGB 10.0* 8.8* 9.2*  HCT 30.4* 26.5* 28.2*  PLT 137* 175 173       Last Labs   No results for input(s): "APTT", "INR" in the last 168 hours.              Other tests/results:  Pathology pending  MRI pending  Assessment/Plan:   Jason [name] is a 66 y.o s/p C6 laminectomy for resection of epidural mass.   - Appreciate ICU care and hospitalist care.   - Ok to leave incision open to air as long as it is not draining.  - Please call with any questions or concerns   Cooper Render PA-C Department of Neurosurgery

## 2021-10-14 NOTE — Progress Notes (Signed)
Inpatient Rehabilitation Admissions Coordinator   I left his wife a voicemail that I would be at [facility] today for bedside rehab assessment.  Danne Baxter, RN, MSN Rehab Admissions Coordinator [phone] 10/14/2021 12:41 PM

## 2021-10-14 NOTE — Progress Notes (Signed)
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 Surgery was on 8/13. Palliative radiation therapy is planned to cervical spine. Neurology is requested to consult regarding MRI finding on 8/15 of small acute ischemic R parietal infarct (TTE 8/15 showed no e/o intracardiac clot but did not include bubble study.   s/p emergent C6 cervical laminectomy with resection of epidural spinal tumor.  He is POD 1 and having some worsening shock despite fluids.  Becoming more confused, lethargic so PCCM consulted.  POST OP RESP FAILURE AND EXTUBATED 8/15 Patient with very weak cough High risk for aspiration     Pertinent  Medical History  HTN Hypothyroidism Allergy GERD  Arthritis   Significant Hospital Events: Including procedures, antibiotic start and stop dates in addition to other pertinent events   8/12 admitted with chronic neck pain and worsening L hand paresthesias, L arm weakness 8/14  Pt transferred to ICU with hypotension along with significant weakness in upper and lower extremities.  Required mechanical intubation for MRI Cervical Spine due to severe claustrophobia  8/14: MR Cervical Spine revealed interval bilateral C6        hemilaminectomies. Similar degree of epidural tumor from          C4-C7 without definite superimposed epidural hematoma or        fluid collection. Progressive spinal canal stenosis from C4-C7,        severe at C5-C6 and C6-C7. No abnormal spinal cord signal or        cord infarct. Similar diffuse cervical  osseous metastatic disease. 8/15: Pt remains mechanically intubated.  Orders placed for MR Brain, Lumbar Spine, and Thoracic Spine to further assess etiology of acute BLE weakness/numbness.  Extubated later in day. 8/18 weak cough, alert and awake, agressive chest PT ordered 8/20 +fever,+sespsi due to aspiration pneumonia 8/21 fever curve better, breathing is better, on Unasyn   Interim History / Subjective:  Off pressors +aspiration pneumonia On Unasyn CHEST PT IS, flutter valve, placed on HHF at 37% Alert and awake Plegic lower ext Weak cough reflex   Objective   Blood pressure 122/71, pulse 68, temperature 99 F (37.2 C), resp. rate 19, height '5\' 9"'$  (1.753 m), weight 91.3 kg, SpO2 100 %.    FiO2 (%):  [37 %] 37 %   Intake/Output Summary (Last 24 hours) at 10/14/2021 0756 Last data filed at 10/14/2021 0700 Gross per 24 hour  Intake 3224.4 ml  Output 2760 ml  Net 464.4 ml    Filed Weights   10/04/21 1656 10/07/21 1645  Weight: 85 kg 91.3 kg    REVIEW OF SYSTEMS +weak cough +SOB All other ROS NEG  Physical Examination:   General Appearance: No distress  EYES PERRLA, EOM intact.   NECK Supple, No JVD Pulmonary: normal breath sounds, +rhonchi CardiovascularNormal S1,S2.  No m/r/g.   Abdomen: Benign, Soft, non-tender. Neuro:plegic LE, b/l UE weakness 1/5 strength PSYCHIATRIC: Mood, affect within normal limits.   ALL OTHER ROS ARE NEGATIVE       Assessment & Plan:   66 yo WM with s/p  emergent C6 cervical laminectomy with resection of epidural spinal tumor. worsening shock despite fluids and post op resp failure now extubation, + aspiration pneumonia progressive neuromuscular weakness with poor resp effort and with resp insufficiency  Severe ACUTE Hypoxic and Hypercapnic Respiratory Failure Extubated Hypoxia slowly improving - Intermittent chest x-ray & ABG PRN - Ensure adequate pulmonary hygiene   RLL aspiration pneumonia This is Primary source of  infection Blood cultures NOT needed     shock SOURCE-spinal related to extensive metastatic disease, improved/resolving -use vasopressors to keep MAP>65 as needed  ONCOLOGY 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   Follow ONC and RAD ONC  NEUROLOGY Small R parietal stroke-  Continue PT/OT IS, flutter valve to strengthen resp muscles - Posterior cervical incision management  - Wean off dexamethasone to promote wound healing - Void trial - Eventual radiation therapy once surgical bed heals - Prozac and klonipin started for understandable anxiety issues - Graded pain management as ordered    OVERALL PROGNOSIS IS VERY POOR RECOMMEND DNR/DNI STATUS  PCCM to sign off at this time  Jason [name], M.D.  [name]  Medical Director [place] Director [facility] Cardio-Pulmonary Department

## 2021-10-14 NOTE — Progress Notes (Signed)
[place] [facility] [place] NAME: Jason [name]    MR#:  [id number]  DATE OF BIRTH:  01-Aug-1955  SUBJECTIVE:  Wife and CIR RN at bedside earlier Underwent speech eval no cough nor overt aspiration noted No cp or sob Worked with PT and OT today currently on heated high flow nasal cannula oxygen. Sats remaining more than 92% wean in progress.    VITALS:  Blood pressure 129/71, pulse 100, temperature (!) 100.4 F (38 C), resp. rate 18, height '5\' 9"'$  (1.753 m), weight 91.3 kg, SpO2 100 %.  PHYSICAL EXAMINATION:   GENERAL:  66 y.o.-year-old patient lying in the bed with no acute distress.  Looks fatigues LUNGS: Normal breath sounds bilaterally, no wheezing, rales, rhonchi.  CARDIOVASCULAR: S1, S2 normal. No murmurs, rubs, or gallops.  ABDOMEN: Soft, nontender, nondistended. Bowel sounds present. Foley+ EXTREMITIES:bilateral wrist and leg splints+ NEUROLOGIC: nonfocal  patient is alert and awake. Weak bilateral UE and LE  LABORATORY PANEL:  CBC Recent Labs  Lab 10/14/21 0452  WBC 12.1*  HGB 7.6*  HCT 23.5*  PLT 200    Chemistries  Recent Labs  Lab 10/14/21 0452  NA 139  K 4.2  CL 114*  CO2 21*  GLUCOSE 102*  BUN 20  CREATININE 0.55*  CALCIUM 8.2*  MG 2.4  AST 102*  ALT 125*  ALKPHOS 165*  BILITOT 0.9   Cardiac Enzymes No results for input(s): "TROPONINI" in the last 168 hours. RADIOLOGY:  [place] EKG SITE RITE  Result Date: 10/13/2021 If [facility] image not attached, placement could not be confirmed due to current cardiac rhythm.  DG Chest Port 1 View  Result Date: 10/12/2021 CLINICAL DATA:  Sepsis, fever EXAM: PORTABLE CHEST 1 VIEW COMPARISON:  10/11/2021 FINDINGS: Lung volumes are small with asymmetric right-sided volume loss, unchanged from prior examination. Bibasilar atelectasis noted. No pneumothorax or pleural effusion. Cardiac size is within normal limits. Left internal jugular central venous catheter tip noted at  the superior cavoatrial junction. The thoracic aorta is ectatic, similar to prior examination and likely accentuated by poor pulmonary insufflation. Pulmonary vascularity is normal. Left total shoulder arthroplasty has been performed. IMPRESSION: 1. Pulmonary hypoinflation. 2. Bibasilar atelectasis. Electronically Signed   By: Fidela Salisbury M.D.   On: 10/12/2021 20:08   DG Abd 1 View  Result Date: 10/12/2021 CLINICAL DATA:  Constipation EXAM: ABDOMEN - 1 VIEW COMPARISON:  10/07/2021 FINDINGS: No bowel dilatation to suggest obstruction. No evidence of pneumoperitoneum, portal venous gas or pneumatosis. No pathologic calcifications along the expected course of the ureters. No acute osseous abnormality. IMPRESSION: Negative. Electronically Signed   By: Kathreen Devoid M.D.   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 Surgery was on 8/13. Palliative radiation therapy is planned to cervical spine. Neurology is requested to consult regarding MRI finding on 8/15 of small acute ischemic R parietal infarct (TTE 8/15 showed no e/o intracardiac clot but did not include bubble study s/p emergent C6 cervical laminectomy with resection of epidural spinal tumor  Metastatic prostate cancer to cervical spine, epidural space, calvarium, pelvis with  significant neurological deficits - s/p emergent C6 cervical laminectomy with resection of epidural spinal tumor. worsening shock despite fluids and post op resp failure now extubation, high risk for aspiration  Extensive metastasis epidural space, spine,  calvarium, pelvis- with significant neurological deficits,  -8/13 s/p cervical tumor debulking and canal decompression -Per PCCM note patient to follow with oncology and radiation oncology. - when more stable officially consult radiation oncology --shock improved   Severe ACUTE Hypoxic and Hypercapnic Respiratory Failure -Extubated -High risk for aspiration and re-intubation -Chest PT okay by neuro -Incentive spirometry - Flutter valve - DuoNeb qid --weaning HFNC down. Sats stable  - Sepsis pneumonia/Aspiration pneumonia -8/19 PCXR increase bibasilar atelectasis possible RLL aspiration -Cont IV unasyn --wbc 12K --low grade fever  --speech evaluation appreciated. No overt signs of aspiration noted. Continue aspiration precaution.  Hypotension--improved --cont midodrine  Shock -Most likely secondary to extensive metastatic disease -Vasopressors discontinued - Resolved   RIGHT parietal stroke - Neurology consulted -Posterior cervical incision management and MAP goal> 65 per NSGY -Wean off dexamethasone to promote wound healing. --recommends ASA to be started (hgb 7.6)   Anemia unspecified/IDA -8/20 discussed case with Dr. Su Monks neurology given patient's anemia will hold off on starting aspirin until at least 8/21 to ensure hemoglobin stable --8/21--iron studies  abnormal. Will d/w pt and wife regarding IV iron --no active bleeding reported -  transfuse for hemoglobin<7  Anxiety - Clonazepam 0.5 mg BID - Prozac 20 mg daily   Procedures:as above Family communication :wife at bedside Consults : neurosurgery, neurology, PC CM CODE STATUS:  FULL DVT Prophylaxis :enoxaparin Level of care: Stepdown Status is: Inpatient Remains inpatient appropriate because: Patient not medically stable to DC. Multiple medical issues.    TOTAL TIME TAKING CARE OF THIS PATIENT: 35 minutes.  >50% time spent on counselling and coordination of care  Note: This dictation was  prepared with Dragon dictation along with smaller phrase technology. Any transcriptional errors that result from this process are unintentional.  Fritzi Mandes M.D    [facility]   CC: Primary care physician; Default, Provider, MD

## 2021-10-14 NOTE — Progress Notes (Signed)
Occupational Therapy Treatment Patient Details Name: Jason [name] MRN: [id number] DOB: 1955-07-04 Today's Date: 10/14/2021   History of present illness Patient is a 66 year old male with Metastatic disease involving retroperitoneal and pelvic lymphadenopathy, extensive spine and calcarial metastasis/epidural tumor cord compression disease.  Status post C6 laminectomy for resection of C6 epidural mass. Surgical pathology showed metastatic adenocarcinoma, compatible with prostatic primary. MRI of the brain shows calvarial metastatic disease most notably in the right temporal region and at the vertex, Additional extracranial extension of tumor into the scalp along the vertex, small focus of diffusion restriction in the right parietal cortex, possible acute infarct.   OT comments  Mr Wing was seen for OT/PT co-treatment on this date. Upon arrival to room pt in bed completing lunch, family at bedside, pt agreeable to tx. Good recall of HEP and proper positioning for edema mgmt. Pt tolerated >25 min static and dynamic sitting at EOB - MAX A to maintain midline with L lateral lean noted. Continues to require TOTAL A x2 for bed mobility. Pt requires MIN A yankauer suction use in sitting with +2 for sitting balance. MAX A don B wrist splints at bed level. TOTAL A don B PRAFO boots at bed level. Pt reports neck pain during session, reviewed importance of exercises and positioning. Reviewed additional HEP to improve B hand edema. Pt making good progress toward goals, will continue to follow POC. Discharge recommendation remains appropriate.    Recommendations for follow up therapy are one component of a multi-disciplinary discharge planning process, led by the attending physician.  Recommendations may be updated based on patient status, additional functional criteria and insurance authorization.    Follow Up Recommendations  Acute inpatient rehab (3hours/day)    Assistance Recommended at Discharge  Frequent or constant Supervision/Assistance  Patient can return home with the following  Two people to help with bathing/dressing/bathroom;Two people to help with walking and/or transfers   Equipment Recommendations  BSC/3in1;Tub/shower seat;Wheelchair (measurements OT);Wheelchair cushion (measurements OT);Hospital bed    Recommendations for Other Services      Precautions / Restrictions Precautions Precautions: Fall;Cervical Precaution Comments: C6 laminectomy, R radial A-line, MAP >80 Restrictions Weight Bearing Restrictions: No       Mobility Bed Mobility Overal bed mobility: Needs Assistance Bed Mobility: Supine to Sit, Sit to Supine     Supine to sit: Total assist, +2 for physical assistance Sit to supine: Total assist, +2 for physical assistance        Transfers                         Balance Overall balance assessment: Needs assistance Sitting-balance support: Feet supported, Bilateral upper extremity supported Sitting balance-Leahy Scale: Poor Sitting balance - Comments: tolerated >25 min static and dynamic sitting at EOB - MAX A to maintain midline with L lateral lean noted.                                   ADL either performed or assessed with clinical judgement   ADL Overall ADL's : Needs assistance/impaired                                       General ADL Comments: MIN A yankauer suction use in sitting with +2 MAX A for sitting balance. MAX A don  B wrist splints at bed level. TOTAL A don B PRAFO boots at bed level.      Cognition Arousal/Alertness: Awake/alert Behavior During Therapy: WFL for tasks assessed/performed Overall Cognitive Status: Within Functional Limits for tasks assessed                                 General Comments: good recall of previous exercises and positioning for edema mgmt              General Comments SpO2 92% on 40L HFNC during seated tasks    Pertinent  Vitals/ Pain       Pain Assessment Pain Assessment: Faces Faces Pain Scale: Hurts even more Pain Location: neck Pain Descriptors / Indicators: Grimacing, Discomfort Pain Intervention(s): Limited activity within patient's tolerance, Repositioned   Frequency  Min 3X/week        Progress Toward Goals  OT Goals(current goals can now be found in the care plan section)  Progress towards OT goals: Progressing toward goals  Acute Rehab OT Goals Patient Stated Goal: to get stronger OT Goal Formulation: With patient/family Time For Goal Achievement: 10/24/21 Potential to Achieve Goals: Good ADL Goals Pt Will Perform Grooming: with mod assist Pt Will Perform Upper Body Dressing: with mod assist Pt Will Transfer to Toilet: with max assist;bedside commode Pt Will Perform Toileting - Clothing Manipulation and hygiene: with mod assist;sitting/lateral leans  Plan Discharge plan remains appropriate;Frequency remains appropriate    Co-evaluation    PT/OT/[name] Co-Evaluation/Treatment: Yes Reason for Co-Treatment: Complexity of the patient's impairments (multi-system involvement);For patient/therapist safety;To address functional/ADL transfers PT goals addressed during session: Mobility/safety with mobility;Balance OT goals addressed during session: ADL's and self-care      AM-PAC OT "6 Clicks" Daily Activity     Outcome Measure   Help from another person eating meals?: A Lot Help from another person taking care of personal grooming?: A Lot Help from another person toileting, which includes using toliet, bedpan, or urinal?: Total Help from another person bathing (including washing, rinsing, drying)?: Total Help from another person to put on and taking off regular upper body clothing?: A Lot Help from another person to put on and taking off regular lower body clothing?: Total 6 Click Score: 9    End of Session    OT Visit Diagnosis: Unsteadiness on feet (R26.81);Muscle weakness  (generalized) (M62.81);Other abnormalities of gait and mobility (R26.89);Other symptoms and signs involving the nervous system (R29.898)   Activity Tolerance Patient tolerated treatment well   Patient Left in bed;with call bell/phone within reach;with nursing/sitter in room;with family/visitor present   Nurse Communication Mobility status        Time: 1423-1520 OT Time Calculation (min): 57 min  Charges: OT General Charges $OT Visit: 1 Visit OT Treatments $Self Care/Home Management : 23-37 mins  Dessie Coma, M.S. OTR/L  10/14/21, 3:53 PM  ascom [phone]

## 2021-10-14 NOTE — Progress Notes (Signed)
  Inpatient Rehabilitation Admissions Coordinator   Met with patient and wife at bedside for rehab assessment. We discussed goals and expectations of a possible CIR admit. They prefer CIR for rehab. I await weaning of oxygen and then I will begin insurance Auth with [employer] [place] for possible CIR admit pending approval. Please call me with any questions.   Danne Baxter, RN, MSN Rehab Admissions Coordinator [phone]

## 2021-10-14 NOTE — Progress Notes (Signed)
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  Status post C6 laminectomy for resection of C6 epidural mass. Surgical pathology showed metastatic adenocarcinoma, compatible with prostatic primary. MRI of the brain shows calvarial metastatic disease most notably in the right temporal region and at the vertex, Additional extracranial extension of tumor into the scalp along the vertex, small focus of diffusion restriction in the right parietal cortex, possible acute infarct.    PT Comments    Pt is making good progress towards goals with ability to tolerate sitting at EOB for extended time this session. Pt shows slight improvement in muscle activation in L LE. Still demonstrates good PROM and skin integrity in joints. Very motivated throughout session. Co-treat performed with OT and continued education given to family for progression of therapy services. Family/patient still very interested in CIR program. Continue to progress as able.  Recommendations for follow up therapy are one component of a multi-disciplinary discharge planning process, led by the attending physician.  Recommendations may be updated based on patient status, additional functional criteria and insurance authorization.  Follow Up Recommendations  Acute inpatient rehab (3hours/day)     Assistance Recommended at Discharge Frequent or constant Supervision/Assistance  Patient can return home with the following Two people to help with walking and/or transfers;A lot of help with bathing/dressing/bathroom;Assistance with feeding;Direct supervision/assist for medications management;Direct supervision/assist for financial management;Assist for  transportation;Help with stairs or ramp for entrance;Assistance with cooking/housework   Equipment Recommendations  Hospital bed;Wheelchair (measurements PT);Wheelchair cushion (measurements PT);BSC/3in1    Recommendations for Other Services       Precautions / Restrictions Precautions Precautions: Fall;Cervical Precaution Comments: C6 laminectomy, R radial A-line, MAP >80 Restrictions Weight Bearing Restrictions: No     Mobility  Bed Mobility Overal bed mobility: Needs Assistance Bed Mobility: Supine to Sit, Sit to Supine     Supine to sit: Total assist, +2 for physical assistance Sit to supine: Total assist, +2 for physical assistance   General bed mobility comments: minimal activation of R LE for bed mobility. +2 required. Unable to sit at EOB without total assist. Able to maintain for >20 mins this date with therapeduic rest breaks as needed.    Transfers                   General transfer comment: unable to attempt    Ambulation/Gait                   Stairs             Wheelchair Mobility    Modified Rankin (Stroke Patients Only)       Balance Overall balance assessment: Needs assistance Sitting-balance support: Feet supported, Bilateral upper extremity supported Sitting balance-Leahy Scale: Poor                                      Cognition Arousal/Alertness: Awake/alert Behavior During Therapy: WFL for tasks assessed/performed Overall Cognitive Status: Within Functional Limits for tasks assessed                                 General Comments: motivated and pleasnt  Exercises Other Exercises Other Exercises: supine/seated ther-ex performed on B LE including attempts for AP, hip add squeezes, and quad sets. Inconsistent muscle activation noted with L>R. Other Exercises: reaching performed with B hand walking down towards knees to assist with weight shift and B UE movement. Weight shifts  in all directions with pt unable to self correct, however aware of positioning. Cues for upright posture in seated postion including neck control and coughing strategies. Other Exercises: placed back in prafo boots and encouraged to work up to 4 hours of wearing schedule    General Comments General comments (skin integrity, edema, etc.): SpO2 92% on 40L HFNC during seated tasks      Pertinent Vitals/Pain Pain Assessment Pain Assessment: Faces Faces Pain Scale: Hurts even more Pain Location: neck Pain Descriptors / Indicators: Grimacing, Discomfort Pain Intervention(s): Limited activity within patient's tolerance, Repositioned    Home Living                          Prior Function            PT Goals (current goals can now be found in the care plan section) Acute Rehab PT Goals Patient Stated Goal: to get as much help as possible and get to rehab PT Goal Formulation: With patient/family Time For Goal Achievement: 10/24/21 Potential to Achieve Goals: Fair Progress towards PT goals: Progressing toward goals    Frequency    Min 3X/week      PT Plan Current plan remains appropriate    Co-evaluation PT/OT/[name] Co-Evaluation/Treatment: Yes Reason for Co-Treatment: Complexity of the patient's impairments (multi-system involvement);To address functional/ADL transfers PT goals addressed during session: Mobility/safety with mobility;Balance;Strengthening/ROM OT goals addressed during session: ADL's and self-care      AM-PAC PT "6 Clicks" Mobility   Outcome Measure  Help needed turning from your back to your side while in a flat bed without using bedrails?: Total Help needed moving from lying on your back to sitting on the side of a flat bed without using bedrails?: Total Help needed moving to and from a bed to a chair (including a wheelchair)?: Total Help needed standing up from a chair using your arms (e.g., wheelchair or bedside chair)?: Total Help needed to  walk in hospital room?: Total Help needed climbing 3-5 steps with a railing? : Total 6 Click Score: 6    End of Session Equipment Utilized During Treatment: Oxygen Activity Tolerance: Patient tolerated treatment well Patient left: in bed (left with OT at bedside) Nurse Communication: Mobility status PT Visit Diagnosis: Muscle weakness (generalized) (M62.81);Other abnormalities of gait and mobility (R26.89);Other symptoms and signs involving the nervous system (R29.898)     Time: 4163-8453 PT Time Calculation (min) (ACUTE ONLY): 23 min  Charges:  $Therapeutic Exercise: 8-22 mins $Therapeutic Activity: 8-22 mins                     Greggory Stallion, PT, DPT, GCS [phone]    Damek Ende 10/14/2021, 4:34 PM

## 2021-10-14 NOTE — TOC Progression Note (Signed)
Transition of Care [facility]) - Progression Note    Patient Details  Name: Jason [name] MRN: [id number] Date of Birth: [birth year]  Transition of Care [facility]) CM/SW Contact  Shelbie Hutching, RN Phone Number: 10/14/2021, 12:23 PM  Clinical Narrative:    RNCM met with patient and wife at the bedside this morning.  Patient reports feeling very tired and weak.  He has not been able to eat since yesterday and wants to eat to hopefully improve his strength.  Speech meeting with patient at the bedside.  Patient is on HFNC 40 L 37% FiO2.   RNCM discussed discharge planning and that current recommendation from therapy is for acute inpatient rehab.  [place] has a inpatient rehab at [facility] in [place].  Patient is very interested in pursing this, he is dedicated and motivated to participate in therapy.  [facility] inpatient rehab will be evaluating patient for admission today.     Expected Discharge Plan: IP Rehab Facility Barriers to Discharge: Continued Medical Work up  Expected Discharge Plan and Services Expected Discharge Plan: [place]   Discharge Planning Services: CM Consult Post Acute Care Choice: IP Rehab Living arrangements for the past 2 months: Single Family Home                 DME Arranged: N/A DME Agency: NA                   Social Determinants of Health (SDOH) Interventions    Readmission Risk Interventions     No data to display

## 2021-10-14 NOTE — Plan of Care (Signed)
Images from MRI brain performed on 8/15 were personally reviewed. I agree with the Radiologist's interpretation regarding the punctate cortically-based right parietal lobe focus of restricted diffusion, which most likely represents a small acute ischemic infarction.   Agree with Dr. Quinn Axe that ASA should be started when the etiology of his worsening anemia is diagnosed and managed. Hgb continues to trend downward, at 7.6 today relative to 7.7 yesterday and 8.6 on 8/19.   Electronically signed: Dr. Kerney Elbe

## 2021-10-14 NOTE — Progress Notes (Addendum)
Speech Language Pathology Treatment: Dysphagia  Patient Details Name: Jason [name] MRN: [id number] DOB: 05/16/1955 Today's Date: 10/14/2021 Time: 1110-1200 [name] Time Calculation (min) (ACUTE ONLY): 50 min  Assessment / Plan / Recommendation Clinical Impression  Pt seen for ongoing assessment of swallowing per MD request after some concern for "aspiration" post recent CXR. Pt and Wife denied any overt s/s of aspiration w/ recent meals; NSG agreed. Labs/chart reviewed. Worsening anemia per MD note.  He was resting in bed w/ Wife present. He was alert, verbally responsive and engaged in conversation w/ [name], Wife -- but often encouraged to take rest breaks for conservation of energy d/t ANY exertion increased min WOB/SOB. RR was in the mid 20s at rest.  NSG denied any overt s/s of aspiration w/ meds given w/ liquids per MD order currently; prior meals. Wife and pt endorsed same.  OF NOTE: Pt exhibits a congested upper airway and cough(at rest) and the effort/productivity of his cough appears improved but not productive to clear any phlegm. Pt and Wife endorsed Baseline congestion and phlegm at home in the mornings.   Pt is on HHFNC O2 at 8L per NSG as an attempt to address pulmonary status/clearing(RT); wbc declining. However, concern of aspiration of condensation from [facility] d/t water dripping from nose that Wife constantly wife away -- this was discussed w/ NSG and MD also.  Discussed general swallowing and impact of illness/fatigue on swallowing; discussed conservation of energy strategies including Stopping PT/OT therapies ~1 hour prior to Meals to allow for rest. Pt is dependent for feeding and needs Rest Breaks frequently during meals. Wife agreed. Education completed w/ Wife and pt on general aspiration precautions; foods/consistencies(easy to chew foods); easy to eat options; use of condiments/gravies. Dietician is following w/ supplements.   Discussed general aspiration precautions, and  pt/Wife agreed verbally to the need for following them especially sitting upright for all oral intake -- supported behind the back/head for full upright sitting. Also taking Rest Breaks during meals for conservation of energy and to lessen any WOB/SOB. Pt encouraged to NOT talk w/ food in his mouth; reduce distractions at meals - Wife agreed.  He was fed trials of puree/mech soft foods and thin liquids via straw- single sip. Rest Breaks given b/t trials. NO overt clinical s/s of aspiration were noted w/ any consistency; respiratory status remained calm and unlabored w/ no change in RR, vocal quality clear b/t trials, no cough. O2 sats remained ~99-100%.  Oral phase appeared grossly [facility] for bolus management, mastication, and timely A-P transfer for swallowing; oral clearing achieved w/ all consistencies. Pt and Wife were encouraged to, and educated on, taking rest breaks b/t trials of more solid foods d/t exertion of mastication -- noted pt used Mouth Breathing at times. Also encouraged CUTTING foods and MOISTENING foods well for ease and conservation of energy. Wife agreed.  OF NOTE: pt tended to talk but quickly remembered not to while still chewing foods.   Pt appears at reduced risk for aspriation when following general aspiration precautions. Recommend continue a Mech soft diet for ease of soft foods w/ gravies added to moisten foods; avoid Particulate foods and TALKING/Distractions while eating. Thin liquids - single sips. Rest Breaks during meals. Conservation of energy focus. Recommend general aspiration precautions; Pills Whole in Puree; tray setup and UPRIGHT positioning assistance for meals. Feeding Assistance at meals.  ST services can be available for further assessment and education as needed next 1-2 days. MD and NSG updated. Precautions posted at  bedside. Wife agreed.     HPI HPI: Pt is a 66 y/o male with h/o hypothyroidism, HTN, GERD, BPH and umbilical hernia repair (1961) who is admitted  with new metastatic disease to bone with epidural cord compression suspected from prostate cancer now s/p C6 laminectomy for resection of epidural mass 8/13.  He reports that he had a left shoulder arthroplasty in March of this year.  Prior to that, he was having pain around his shoulder blade and left shoulder.  His shoulder arthroplasty did help somewhat, but he has continued to have left-sided weakness with abduction of his left shoulder and with extension of his arm.  This been ongoing for several months.  He has tingling down his left arm into his first 2 digits of his left hand.  Pt extubated yesterday post 1 day oral intubation for MRI.  CXR: There are no signs of pulmonary edema or focal pulmonary  consolidation. There is improvement in aeration of lower lung fields  suggesting resolving subsegmental atelectasis.      [name] Plan  Continue with current plan of care (po check x1-2)      Recommendations for follow up therapy are one component of a multi-disciplinary discharge planning process, led by the attending physician.  Recommendations may be updated based on patient status, additional functional criteria and insurance authorization.    Recommendations  Diet recommendations: Dysphagia 3 (mechanical soft);Thin liquid (moistened foods well; cut small) Liquids provided via: Cup;Straw (single sips) Medication Administration: Whole meds with puree Supervision: Staff to assist with self feeding;Full supervision/cueing for compensatory strategies Compensations: Minimize environmental distractions;Slow rate;Small sips/bites;Lingual sweep for clearance of pocketing;Multiple dry swallows after each bite/sip;Follow solids with liquid (Rest Breaks during meals) Postural Changes and/or Swallow Maneuvers: Out of bed for meals;Seated upright 90 degrees;Upright 30-60 min after meal                General recommendations:  (Dietician f/u; Palliative Care f/u) Oral Care Recommendations: Oral care  BID;Oral care before and after PO;Staff/trained caregiver to provide oral care Follow Up Recommendations: No [name] follow up (at D/C TBD) Assistance recommended at discharge: Frequent or constant Supervision/Assistance (for feeding support) [name] Visit Diagnosis: Dysphagia, unspecified (R13.10) (dependent for feeding; decreased Stamina and Pulmonary status) Plan: Continue with current plan of care (po check x1-2)             [name], MS, [place]; [place] [phone] (ascom) Tyshawn Keel  10/14/2021, 6:49 PM

## 2021-10-15 ENCOUNTER — Telehealth: Payer: Self-pay | Admitting: Urology

## 2021-10-15 DIAGNOSIS — M4802 Spinal stenosis, cervical region: Secondary | ICD-10-CM | POA: Diagnosis not present

## 2021-10-15 LAB — GLUCOSE, CAPILLARY
Glucose-Capillary: 104 mg/dL — ABNORMAL HIGH (ref 70–99)
Glucose-Capillary: 118 mg/dL — ABNORMAL HIGH (ref 70–99)
Glucose-Capillary: 125 mg/dL — ABNORMAL HIGH (ref 70–99)
Glucose-Capillary: 134 mg/dL — ABNORMAL HIGH (ref 70–99)
Glucose-Capillary: 58 mg/dL — ABNORMAL LOW (ref 70–99)
Glucose-Capillary: 71 mg/dL (ref 70–99)
Glucose-Capillary: 89 mg/dL (ref 70–99)
Glucose-Capillary: 93 mg/dL (ref 70–99)

## 2021-10-15 LAB — PREPARE RBC (CROSSMATCH)

## 2021-10-15 LAB — OCCULT BLOOD X 1 CARD TO LAB, STOOL: Fecal Occult Bld: NEGATIVE

## 2021-10-15 LAB — RETIC PANEL
Immature Retic Fract: 29.4 % — ABNORMAL HIGH (ref 2.3–15.9)
RBC.: 2.53 MIL/uL — ABNORMAL LOW (ref 4.22–5.81)
Retic Count, Absolute: 88.8 10*3/uL (ref 19.0–186.0)
Retic Ct Pct: 3.5 % — ABNORMAL HIGH (ref 0.4–3.1)
Reticulocyte Hemoglobin: 27.9 pg — ABNORMAL LOW (ref >27.9)

## 2021-10-15 LAB — HEMOGLOBIN: Hemoglobin: 7.7 g/dL — ABNORMAL LOW (ref 13.0–17.0)

## 2021-10-15 MED ORDER — ORAL CARE MOUTH RINSE: 15.0000 mL | OROMUCOSAL | Status: DC | PRN

## 2021-10-15 MED ORDER — POLYSACCHARIDE IRON COMPLEX 150 MG PO CAPS
150.0000 mg | ORAL_CAPSULE | Freq: Every day | ORAL | Status: DC
  Administered 2021-10-15 – 2021-10-17 (×3): 150 mg via ORAL
  Filled 2021-10-15 (×3): qty 1

## 2021-10-15 MED ORDER — SODIUM CHLORIDE 0.9 % IV SOLN
510.0000 mg | Freq: Once | INTRAVENOUS | Status: AC
  Administered 2021-10-15: 510 mg via INTRAVENOUS
  Filled 2021-10-15: qty 17

## 2021-10-15 MED ORDER — ASPIRIN 81 MG PO TBEC
81.0000 mg | DELAYED_RELEASE_TABLET | Freq: Every day | ORAL | Status: DC
  Administered 2021-10-15 – 2021-10-17 (×3): 81 mg via ORAL
  Filled 2021-10-15 (×3): qty 1

## 2021-10-15 MED ORDER — BLISTEX MEDICATED EX OINT
1.0000 | TOPICAL_OINTMENT | CUTANEOUS | Status: DC | PRN
  Filled 2021-10-15: qty 6.3

## 2021-10-15 MED ORDER — ORAL CARE MOUTH RINSE
15.0000 mL | OROMUCOSAL | Status: DC
  Administered 2021-10-15 (×6): 15 mL via OROMUCOSAL

## 2021-10-15 MED ORDER — ENOXAPARIN SODIUM 60 MG/0.6ML IJ SOSY
0.5000 mg/kg | PREFILLED_SYRINGE | INTRAMUSCULAR | Status: DC
  Administered 2021-10-15 – 2021-10-16 (×2): 50 mg via SUBCUTANEOUS
  Filled 2021-10-15 (×2): qty 0.6

## 2021-10-15 MED ORDER — LIP MEDEX EX OINT: TOPICAL_OINTMENT | CUTANEOUS | Status: DC | PRN

## 2021-10-15 NOTE — Progress Notes (Signed)
Occupational Therapy Treatment Patient Details Name: Jason [name] MRN: [id number] DOB: October 29, 1955 Today's Date: 10/15/2021   History of present illness Patient is a 66 year old male with Metastatic disease involving retroperitoneal and pelvic lymphadenopathy, extensive spine and calcarial metastasis/epidural tumor cord compression disease.  Status post C6 laminectomy for resection of C6 epidural mass. Surgical pathology showed metastatic adenocarcinoma, compatible with prostatic primary. MRI of the brain shows calvarial metastatic disease most notably in the right temporal region and at the vertex, Additional extracranial extension of tumor into the scalp along the vertex, small focus of diffusion restriction in the right parietal cortex, possible acute infarct.   OT comments  Mr Derasmo was seen for OT/PT co-treatment on this date. Upon arrival to room pt reclined in bed, wife at bed side, pt eager and agreeable to tx. Pt requires MAX A don/doff B socks and prevalon boots at bed level. TOTAL A x2 for bed mobility. Requires BUE support static sitting, tolerates ~20 min with focus on activating core to maintain midline. Improved head control with decreased neck pain in sitting. Pt completing squeeze ball grip strengthening exercises at bed level. Pt making good progress toward goals, will continue to follow POC. Discharge recommendation remains appropriate.     Recommendations for follow up therapy are one component of a multi-disciplinary discharge planning process, led by the attending physician.  Recommendations may be updated based on patient status, additional functional criteria and insurance authorization.    Follow Up Recommendations  Acute inpatient rehab (3hours/day)    Assistance Recommended at Discharge Frequent or constant Supervision/Assistance  Patient can return home with the following  Two people to help with bathing/dressing/bathroom;Two people to help with walking and/or  transfers   Equipment Recommendations  BSC/3in1;Tub/shower seat;Wheelchair (measurements OT);Wheelchair cushion (measurements OT);Hospital bed    Recommendations for Other Services      Precautions / Restrictions Precautions Precautions: Fall;Cervical Restrictions Weight Bearing Restrictions: No       Mobility Bed Mobility Overal bed mobility: Needs Assistance Bed Mobility: Supine to Sit, Sit to Supine     Supine to sit: Total assist, +2 for physical assistance Sit to supine: Total assist, +2 for physical assistance        Transfers                   General transfer comment: unable to attempt     Balance Overall balance assessment: Needs assistance Sitting-balance support: Feet supported, Bilateral upper extremity supported Sitting balance-Leahy Scale: Poor                                     ADL either performed or assessed with clinical judgement   ADL Overall ADL's : Needs assistance/impaired                                       General ADL Comments: MAX A don/doff B socks and prevalon boots. Requires BUE support static sitting, tolerates ~20 min.      Cognition Arousal/Alertness: Awake/alert Behavior During Therapy: WFL for tasks assessed/performed Overall Cognitive Status: Within Functional Limits for tasks assessed                                 General Comments: motivated and pleasant  General Comments SpO2 low 90s on 4L [place]    Pertinent Vitals/ Pain       Pain Assessment Pain Assessment: Faces Faces Pain Scale: Hurts a little bit Pain Location: neck Pain Descriptors / Indicators: Discomfort Pain Intervention(s): Limited activity within patient's tolerance, Repositioned   Frequency  Min 3X/week        Progress Toward Goals  OT Goals(current goals can now be found in the care plan section)  Progress towards OT goals: Progressing toward goals  Acute Rehab OT  Goals Patient Stated Goal: to go to [facility] OT Goal Formulation: With patient/family Time For Goal Achievement: 10/24/21 Potential to Achieve Goals: Good ADL Goals Pt Will Perform Grooming: with mod assist Pt Will Perform Upper Body Dressing: with mod assist Pt Will Transfer to Toilet: with max assist;bedside commode Pt Will Perform Toileting - Clothing Manipulation and hygiene: with mod assist;sitting/lateral leans  Plan Discharge plan remains appropriate;Frequency remains appropriate    Co-evaluation    PT/OT/[name] Co-Evaluation/Treatment: Yes Reason for Co-Treatment: Complexity of the patient's impairments (multi-system involvement);For patient/therapist safety;To address functional/ADL transfers PT goals addressed during session: Mobility/safety with mobility OT goals addressed during session: ADL's and self-care      AM-PAC OT "6 Clicks" Daily Activity     Outcome Measure   Help from another person eating meals?: A Lot Help from another person taking care of personal grooming?: A Lot Help from another person toileting, which includes using toliet, bedpan, or urinal?: Total Help from another person bathing (including washing, rinsing, drying)?: Total Help from another person to put on and taking off regular upper body clothing?: A Lot Help from another person to put on and taking off regular lower body clothing?: Total 6 Click Score: 9    End of Session    OT Visit Diagnosis: Unsteadiness on feet (R26.81);Muscle weakness (generalized) (M62.81);Other abnormalities of gait and mobility (R26.89);Other symptoms and signs involving the nervous system (R29.898)   Activity Tolerance Patient tolerated treatment well   Patient Left in bed;with call bell/phone within reach   Nurse Communication Mobility status        Time: 1101-1135 OT Time Calculation (min): 34 min  Charges: OT General Charges $OT Visit: 1 Visit OT Treatments $Therapeutic Activity: 8-22 mins  Dessie Coma,  M.S. OTR/L  10/15/21, 1:21 PM  ascom [phone]

## 2021-10-15 NOTE — Progress Notes (Signed)
PHARMACIST - PHYSICIAN COMMUNICATION  CONCERNING:  Enoxaparin (Lovenox) for DVT Prophylaxis    RECOMMENDATION: Patient was prescribed enoxaprin '40mg'$  q24 hours for VTE prophylaxis.   Filed Weights   10/04/21 1656 10/07/21 1645 10/15/21 0430  Weight: 85 kg (187 lb 6.3 oz) 91.3 kg (201 lb 4.5 oz) 101.1 kg (222 lb 14.2 oz)    Body mass index is 32.91 kg/m.  Estimated Creatinine Clearance: 110.8 mL/min (A) (by C-G formula based on SCr of 0.55 mg/dL (L)).   Based on [place] patient is candidate for enoxaparin 0.'5mg'$ /kg TBW SQ every 24 hours based on BMI being >30.  DESCRIPTION: Pharmacy has adjusted enoxaparin dose per [facility] policy.  Patient is now receiving enoxaparin 0.5 mg/kg every 24 hours    Dallie Piles, PharmD Clinical Pharmacist  10/15/2021 12:28 PM

## 2021-10-15 NOTE — Telephone Encounter (Signed)
LMOM for pt  schedule morning visit in 4 weeks for foley removal and voiding trial, PM PVR with me as well

## 2021-10-15 NOTE — Progress Notes (Signed)
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  Status post C6 laminectomy for resection of C6 epidural mass. Surgical pathology showed metastatic adenocarcinoma, compatible with prostatic primary. MRI of the brain shows calvarial metastatic disease most notably in the right temporal region and at the vertex, Additional extracranial extension of tumor into the scalp along the vertex, small focus of diffusion restriction in the right parietal cortex, possible acute infarct.    PT Comments    Patient is agreeable to PT session. He asks appropriate questions regarding mobility and involuntary LE movements. He tolerated sitting up on the edge of bed with +2 person assistance for bed mobility. Emphasis on trunk muscle activation, maintaining midline sitting balance and head/neck in midline, deep breathing, and coughing while seated. Vitals stable throughout mobility efforts and no increased pain is reported. The patient has some trace muscle activation in LLE (see below) with no active movement in RLE. Recommend to continue PT to maximize independence and decrease caregiver burden.    Recommendations for follow up therapy are one component of a multi-disciplinary discharge planning process, led by the attending physician.  Recommendations may be updated based on patient status, additional functional criteria and insurance authorization.  Follow Up Recommendations  Acute inpatient rehab (3hours/day)     Assistance Recommended at Discharge Frequent or constant Supervision/Assistance  Patient can return home with the following Two people to help with walking and/or transfers;A lot of help with  bathing/dressing/bathroom;Assistance with feeding;Direct supervision/assist for medications management;Direct supervision/assist for financial management;Assist for transportation;Help with stairs or ramp for entrance;Assistance with cooking/housework   Equipment Recommendations  Hospital bed;Wheelchair (measurements PT);Wheelchair cushion (measurements PT);BSC/3in1 (custom tilt in space, high back wheelechair)    Recommendations for Other Services       Precautions / Restrictions Precautions Precautions: Fall;Cervical Precaution Comments: C6 laminectomy, MAP >65 Restrictions Weight Bearing Restrictions: No     Mobility  Bed Mobility Overal bed mobility: Needs Assistance Bed Mobility: Supine to Sit, Sit to Supine     Supine to sit: Total assist, +2 for physical assistance Sit to supine: Total assist, +2 for physical assistance   General bed mobility comments: cues for using RUE to faciliate upright trunk with minimal activation noted. +2 person assistance required    Transfers                   General transfer comment: unsafe to attempt due to severity of weakness. would need a lift for OOB activity at this time    Ambulation/Gait                   Stairs             Wheelchair Mobility    Modified Rankin (Stroke Patients Only)       Balance Overall balance assessment: Needs assistance Sitting-balance support: Feet supported, Bilateral upper extremity supported Sitting balance-Leahy Scale: Poor Sitting balance - Comments: minimal protective righting reactions noted. significant weakness throughout and external support required to maintain mildine sitting balance. emphasis on trunk activation, keeping head, neck, and trunk in midline. also had patient practice deep breathing techniques and coughing Postural control:  (loss of balance in all directions)  Cognition Arousal/Alertness:  Awake/alert Behavior During Therapy: WFL for tasks assessed/performed Overall Cognitive Status: Within Functional Limits for tasks assessed                                 General Comments: patient is motivated and able to follow single step commands with increased time        Exercises Other Exercises Other Exercises: faciliation provided for BLE with PROM. no muscle activation noted in RLE. patient has 1/5 left dorsiflexion, 1/5 left hip adduction, 1/5 knee extension    General Comments General comments (skin integrity, edema, etc.): Sp02 remained in the 90's with activity on 4 L02. blood pressure 125/69, heart rate 85, Sp02 99%      Pertinent Vitals/Pain Pain Assessment Pain Assessment: Faces Faces Pain Scale: Hurts a little bit Pain Location: neck Pain Descriptors / Indicators: Discomfort Pain Intervention(s): Limited activity within patient's tolerance    Home Living                          Prior Function            PT Goals (current goals can now be found in the care plan section) Acute Rehab PT Goals Patient Stated Goal: to get to rehab PT Goal Formulation: With patient Time For Goal Achievement: 10/24/21 Potential to Achieve Goals: Fair Progress towards PT goals: Progressing toward goals    Frequency    Min 3X/week      PT Plan Current plan remains appropriate    Co-evaluation PT/OT/[name] Co-Evaluation/Treatment: Yes Reason for Co-Treatment: Complexity of the patient's impairments (multi-system involvement);For patient/therapist safety;To address functional/ADL transfers PT goals addressed during session: Mobility/safety with mobility OT goals addressed during session: ADL's and self-care      AM-PAC PT "6 Clicks" Mobility   Outcome Measure  Help needed turning from your back to your side while in a flat bed without using bedrails?: Total Help needed moving from lying on your back to sitting on the side of a flat bed without  using bedrails?: Total Help needed moving to and from a bed to a chair (including a wheelchair)?: Total Help needed standing up from a chair using your arms (e.g., wheelchair or bedside chair)?: Total Help needed to walk in hospital room?: Total Help needed climbing 3-5 steps with a railing? : Total 6 Click Score: 6    End of Session Equipment Utilized During Treatment: Oxygen Activity Tolerance: Patient tolerated treatment well Patient left: in bed;with SCD's reapplied (prevlon boots in place) Nurse Communication: Mobility status PT Visit Diagnosis: Muscle weakness (generalized) (M62.81);Other abnormalities of gait and mobility (R26.89);Other symptoms and signs involving the nervous system (R29.898)     Time: 7253-6644 PT Time Calculation (min) (ACUTE ONLY): 38 min  Charges:  $Therapeutic Activity: 23-37 mins                     Minna Merritts, PT, MPT    Percell Locus 10/15/2021, 1:50 PM

## 2021-10-15 NOTE — Progress Notes (Incomplete)
Hematology/Oncology Progress note [phone] [phone]     Patient Care Team: Default, Provider, MD as PCP - General End, Harrell Gave, MD as PCP - Cardiology (Cardiology)   Name of the patient: Jason [name]  [id number]  Jul 19, 1955  Date of visit: 10/15/21   INTERVAL HISTORY-  10/07/2021 he developed weakness of upper and lower extremities, difficulty feeding himself on 10/07/2021, he blood pressure was soft and improved after 1 L of fluid bolus.  His blood pressure medication and IV pain medications were held.  Blood pressure continues to be unstable, patient was admitted to ICU for pressor support.Patient required mechanical intubation. Patient had additional work-up including 10/08/2021 MRI thoracic and lumbar spine, MRI brain which both showed extensive osseous metastatic disease, epidural tumor, Calvarial metastatic disease, small focus of diffusion restriction in the right parietal cortex most in keeping with small acute infarct.  10/09/2021 status post Firmagon 240 mg loading dose  Patient was also seen by neurology and radiation oncology.  Neurology recommended aspirin to be started.  No plan for inpatient radiation or right ankle. 10/12/2021, bibasilar atelectasis possible right lower lobe aspiration, on antibiotics.  Patient was seen at the bedside.  Slight improvement of left upper and lower extremity strength.  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 ([place]) 10/12/2021   Anxiety 10/12/2021   Palliative care encounter    Prostate cancer metastatic to multiple sites [facility])    Hypotension and abnormal strength/sensation  10/07/2021   Elevated PSA    Abnormal MRI     Goals of care, counseling/discussion    Cervical spinal stenosis 10/05/2021   Metastatic disease ([place]) 10/05/2021   Hypertensive urgency 10/05/2021   Left shoulder pain 03/29/2021   PSVT (paroxysmal supraventricular tachycardia) ([place]) 01/25/2021   NSVT (nonsustained ventricular tachycardia) ([place]) 01/25/2021   Hyperlipidemia 01/25/2021   Acute non-recurrent frontal sinusitis 12/27/2020   PVC's (premature ventricular contractions) 03/15/2020   Palpitations 03/14/2020   BPH (benign prostatic hyperplasia) 11/07/2013   Routine general medical examination at a health care facility 06/24/2010   Hypothyroidism 06/19/2009   OSTEOARTHRITIS 12/19/2008   BACK PAIN, LUMBAR 02/26/2007   Essential hypertension 09/17/2006   ALLERGIC RHINITIS 09/17/2006   GERD 09/17/2006     Past Medical History:  Diagnosis Date   Allergy    Arthritis    osteoarthritis   GERD (gastroesophageal reflux disease)    Hypertension    Hypothyroidism    Thyroid disease    Hyperthyroidism s/p radioactive iodine ablation     Past Surgical History:  Procedure Laterality Date   CARDIAC CATHETERIZATION  03/2000   HERNIA REPAIR  4403   umbilical   LEFT HEART CATH AND CORONARY ANGIOGRAPHY Left 03/20/2020   Procedure: LEFT HEART CATH AND CORONARY ANGIOGRAPHY;  Surgeon: Nelva Bush, MD;  Location: [place] [facility];  Service: Cardiovascular;  Laterality: Left;   POSTERIOR CERVICAL FUSION/FORAMINOTOMY N/A 10/06/2021   Procedure: POSTERIOR CERVICAL FUSION/ FORAMINOTOMY LEVEL 3;  Surgeon: Meade Maw, MD;  Location: [facility];  Service: Neurosurgery;  Laterality: N/A;  will need monitoring   RADIOLOGY WITH ANESTHESIA Left 04/18/2021   Procedure: MRI SHOULDER WITHOUT CONTRAST WITH ANESTHESIA;  Surgeon: Radiologist, Medication, MD;  Location: [place];  Service: Radiology;  Laterality: Left;  RADIOLOGY WITH ANESTHESIA N/A 10/03/2021   Procedure: MRI CERVICAL SPINE WITH ANESTHESIA;  Surgeon: Radiologist,  Medication, MD;  Location: [place];  Service: Radiology;  Laterality: N/A;   REVERSE SHOULDER ARTHROPLASTY Left    ROTATOR CUFF REPAIR  11/2010   Dr [name] Heck   TONSILLECTOMY     TOTAL HIP ARTHROPLASTY Right 02/24/2009   TOTAL HIP ARTHROPLASTY Left 06/2016   Dr Harlow Mares    Social History   Socioeconomic History   Marital status: Married    Spouse name: [name]    Number of children: 0   Years of education: Not on file   Highest education level: Not on file  Occupational History   Occupation: Therapist, occupational: [employer]  Tobacco Use   Smoking status: Never   Smokeless tobacco: Never  Vaping Use   Vaping Use: Never used  Substance and Sexual Activity   Alcohol use: Not Currently    Comment: Alcohol once every few weeks/months   Drug use: No   Sexual activity: Yes  Other Topics Concern   Not on file  Social History Narrative   Lives at home with spouse.    Social Determinants of Health   Financial Resource Strain: Not on file  Food Insecurity: Not on file  Transportation Needs: Not on file  Physical Activity: Not on file  Stress: Not on file  Social Connections: Not on file  Intimate Partner Violence: Not on file     Family History  Problem Relation Age of Onset   Hypertension Mother    Stroke Mother    Gout Father    Heart attack Father 14   Heart disease Sister    Colon cancer Neg Hx    Esophageal cancer Neg Hx    Rectal cancer Neg Hx    Stomach cancer Neg Hx      Current Facility-Administered Medications:    0.9 %  sodium chloride infusion, 250 mL, Intravenous, Continuous, Candee Furbish, MD, Last Rate: 10 mL/hr at 10/14/21 1938, Infusion Verify at 10/14/21 1938   acetaminophen (TYLENOL) tablet 650 mg, 650 mg, Oral, Q6H PRN, 650 mg at 10/15/21 1042 **OR** acetaminophen (TYLENOL) suppository 650 mg, 650 mg, Rectal, Q6H PRN, Meade Maw, MD   Ampicillin-Sulbactam (UNASYN) 3 g in sodium chloride 0.9 % 100 mL IVPB, 3 g, Intravenous, Q6H,  Coulter, Carolyn, RPH, Last Rate: 200 mL/hr at 10/15/21 1141, 3 g at 10/15/21 1141   ascorbic acid (VITAMIN C) tablet 500 mg, 500 mg, Oral, Daily, Meade Maw, MD, 500 mg at 10/15/21 1038   aspirin EC tablet 81 mg, 81 mg, Oral, Daily, Posey Pronto, Sona, MD   atorvastatin (LIPITOR) tablet 40 mg, 40 mg, Oral, Daily, Su Monks M, MD, 40 mg at 10/15/21 1037   budesonide (PULMICORT) nebulizer solution 0.5 mg, 0.5 mg, Nebulization, BID, Kasa, Kurian, MD, 0.5 mg at 10/15/21 5366   Chlorhexidine Gluconate Cloth 2 % PADS 6 each, 6 each, Topical, Daily, Allie Bossier, MD, 6 each at 10/15/21 1038   clonazePAM (KLONOPIN) tablet 0.5 mg, 0.5 mg, Oral, BID, Candee Furbish, MD, 0.5 mg at 10/15/21 1037   dextromethorphan-guaiFENesin (MUCINEX DM) 30-600 MG per 12 hr tablet 1 tablet, 1 tablet, Oral, BID, Meade Maw, MD, 1 tablet at 10/15/21 1038   dextrose 5 %-0.9 % sodium chloride infusion, , Intravenous, Continuous, Fritzi Mandes, MD, Last Rate: 40 mL/hr at 10/15/21 1344, New Bag at 10/15/21 1344   docusate sodium (COLACE) capsule 100 mg, 100 mg, Oral, BID, Chappell,  Axel Filler, RPH, 100 mg at 10/15/21 1038   enoxaparin (LOVENOX) injection 50 mg, 0.5 mg/kg, Subcutaneous, Q24H, Dallie Piles, RPH   famotidine (PEPCID) tablet 20 mg, 20 mg, Oral, Daily, Benita Gutter, RPH, 20 mg at 10/15/21 1037   feeding supplement (ENSURE ENLIVE / ENSURE PLUS) liquid 237 mL, 237 mL, Oral, TID BM, Fritzi Mandes, MD, 237 mL at 10/15/21 1038   ferumoxytol (FERAHEME) 510 mg in sodium chloride 0.9 % 100 mL IVPB, 510 mg, Intravenous, Once, Fritzi Mandes, MD   FLUoxetine (PROZAC) capsule 20 mg, 20 mg, Oral, QHS, Candee Furbish, MD, 20 mg at 10/14/21 2156   hydrALAZINE (APRESOLINE) injection 5 mg, 5 mg, Intravenous, Q6H PRN, Emeterio Reeve, DO   insulin aspart (novoLOG) injection 0-9 Units, 0-9 Units, Subcutaneous, Q4H, Rust-Chester, Britton L, NP, 1 Units at 10/15/21 1140   ipratropium-albuterol (DUONEB) 0.5-2.5 (3) MG/3ML  nebulizer solution 3 mL, 3 mL, Nebulization, TID, Allie Bossier, MD, 3 mL at 10/15/21 1348   iron polysaccharides (NIFEREX) capsule 150 mg, 150 mg, Oral, Daily, Fritzi Mandes, MD   levothyroxine (SYNTHROID) tablet 88 mcg, 88 mcg, Oral, Q0600, Meade Maw, MD, 88 mcg at 10/15/21 0525   lip balm (BLISTEX) ointment 1 Application, 1 Application, Topical, PRN, Renda Rolls, RPH   loratadine (CLARITIN) tablet 10 mg, 10 mg, Oral, BID, Meade Maw, MD, 10 mg at 10/15/21 1037   magnesium hydroxide (MILK OF MAGNESIA) suspension 30 mL, 30 mL, Oral, Daily PRN, Meade Maw, MD, 30 mL at 10/05/21 0744   melatonin tablet 5 mg, 5 mg, Oral, QHS PRN, Meade Maw, MD, 5 mg at 10/09/21 2135   meloxicam (MOBIC) tablet 15 mg, 15 mg, Oral, Daily, Meade Maw, MD, 15 mg at 10/14/21 2155   midodrine (PROAMATINE) tablet 5 mg, 5 mg, Oral, TID WC, Allie Bossier, MD, 5 mg at 10/15/21 1140   multivitamin with minerals tablet 1 tablet, 1 tablet, Oral, Daily, Meade Maw, MD, 1 tablet at 10/15/21 1037   ondansetron (ZOFRAN) tablet 4 mg, 4 mg, Oral, Q6H PRN **OR** ondansetron (ZOFRAN) injection 4 mg, 4 mg, Intravenous, Q6H PRN, Meade Maw, MD   Oral care mouth rinse, 15 mL, Mouth Rinse, Q2H, Fritzi Mandes, MD, 15 mL at 10/15/21 1144   oxyCODONE-acetaminophen (PERCOCET/ROXICET) 5-325 MG per tablet 1-2 tablet, 1-2 tablet, Oral, Q6H PRN, Candee Furbish, MD   polyethylene glycol (MIRALAX / GLYCOLAX) packet 17 g, 17 g, Oral, Daily, Rust-Chester, Britton L, NP, 17 g at 10/15/21 1038   potassium chloride (KLOR-CON M) CR tablet 10 mEq, 10 mEq, Oral, QHS, Meade Maw, MD, 10 mEq at 10/14/21 2155   sodium chloride (OCEAN) 0.65 % nasal spray 1 spray, 1 spray, Each Nare, PRN, Candee Furbish, MD, 1 spray at 10/09/21 2206   sodium chloride flush (NS) 0.9 % injection 10-40 mL, 10-40 mL, Intracatheter, Q12H, Allie Bossier, MD, 10 mL at 10/15/21 1038   sodium chloride flush (NS) 0.9 %  injection 10-40 mL, 10-40 mL, Intracatheter, PRN, Allie Bossier, MD   sorbitol, milk of mag, mineral oil, glycerin (SMOG) enema, 400 mL, Rectal, Once, Allie Bossier, MD   tamsulosin [facility]) capsule 0.4 mg, 0.4 mg, Oral, Daily, Meade Maw, MD, 0.4 mg at 10/15/21 0817   Physical exam:  Vitals:   10/15/21 1000 10/15/21 1100 10/15/21 1200 10/15/21 1300  BP: 122/69 125/69 132/72 130/67  Pulse: 84 80 77 61  Resp: (!) 23 (!) 24 (!) 24 (!) 22  Temp: 99.1 F (37.3  C) 99 F (37.2 C) 99.1 F (37.3 C) 99.1 F (37.3 C)  TempSrc:      SpO2: 100% 100% 100% 100%  Weight:      Height:       Physical Exam HENT:     Head: Normocephalic.  Cardiovascular:     Rate and Rhythm: Normal rate.  Pulmonary:     Comments: Poor inspiratory efforts Abdominal:     General: There is no distension.     Palpations: Abdomen is soft.  Musculoskeletal:        General: No deformity.  Skin:    General: Skin is warm.  Neurological:     Mental Status: He is alert.     Sensory: Sensory deficit present.     Motor: Weakness present.     Comments: Decreased bilateral upper and lower extremities.       Labs     Latest Ref Rng & Units 10/14/2021    4:52 AM  CMP  Glucose 70 - 99 mg/dL 102   BUN 8 - 23 mg/dL 20   Creatinine 0.61 - 1.24 mg/dL 0.55   Sodium 135 - 145 mmol/L 139   Potassium 3.5 - 5.1 mmol/L 4.2   Chloride 98 - 111 mmol/L 114   CO2 22 - 32 mmol/L 21   Calcium 8.9 - 10.3 mg/dL 8.2   Total Protein 6.5 - 8.1 g/dL 5.3   Total Bilirubin 0.3 - 1.2 mg/dL 0.9   Alkaline Phos 38 - 126 U/L 165   AST 15 - 41 U/L 102   ALT 0 - 44 U/L 125       Latest Ref Rng & Units 10/15/2021    4:29 AM  CBC  Hemoglobin 13.0 - 17.0 g/dL 7.7     RADIOGRAPHIC STUDIES: I have personally reviewed the radiological images as listed and agreed with the findings in the report. [place] EKG SITE RITE  Result Date: 10/13/2021 If [facility] image not attached, placement could not be confirmed due to current  cardiac rhythm.  DG Chest Port 1 View  Result Date: 10/12/2021 CLINICAL DATA:  Sepsis, fever EXAM: PORTABLE CHEST 1 VIEW COMPARISON:  10/11/2021 FINDINGS: Lung volumes are small with asymmetric right-sided volume loss, unchanged from prior examination. Bibasilar atelectasis noted. No pneumothorax or pleural effusion. Cardiac size is within normal limits. Left internal jugular central venous catheter tip noted at the superior cavoatrial junction. The thoracic aorta is ectatic, similar to prior examination and likely accentuated by poor pulmonary insufflation. Pulmonary vascularity is normal. Left total shoulder arthroplasty has been performed. IMPRESSION: 1. Pulmonary hypoinflation. 2. Bibasilar atelectasis. Electronically Signed   By: Fidela Salisbury M.D.   On: 10/12/2021 20:08   DG Abd 1 View  Result Date: 10/12/2021 CLINICAL DATA:  Constipation EXAM: ABDOMEN - 1 VIEW COMPARISON:  10/07/2021 FINDINGS: No bowel dilatation to suggest obstruction. No evidence of pneumoperitoneum, portal venous gas or pneumatosis. No pathologic calcifications along the expected course of the ureters. No acute osseous abnormality. IMPRESSION: Negative. Electronically Signed   By: Kathreen Devoid M.D.   On: 10/12/2021 17:49   US Venous Img Lower Bilateral (DVT)  Result Date: 10/11/2021 CLINICAL DATA:  CVA. EXAM: BILATERAL LOWER EXTREMITY VENOUS DOPPLER ULTRASOUND TECHNIQUE: Gray-scale sonography with graded compression, as well as color Doppler and duplex ultrasound were performed to evaluate the lower extremity deep venous systems from the level of the common femoral vein and including the common femoral, femoral, profunda femoral, popliteal and calf veins including the posterior tibial,  peroneal and gastrocnemius veins when visible. The superficial great saphenous vein was also interrogated. Spectral Doppler was utilized to evaluate flow at rest and with distal augmentation maneuvers in the common femoral, femoral and popliteal  veins. COMPARISON:  None Available. FINDINGS: RIGHT LOWER EXTREMITY Common Femoral Vein: No evidence of thrombus. Normal compressibility, respiratory phasicity and response to augmentation. Saphenofemoral Junction: No evidence of thrombus. Normal compressibility and flow on color Doppler imaging. Profunda Femoral Vein: No evidence of thrombus. Normal compressibility and flow on color Doppler imaging. Femoral Vein: No evidence of thrombus. Normal compressibility, respiratory phasicity and response to augmentation. Popliteal Vein: No evidence of thrombus. Normal compressibility, respiratory phasicity and response to augmentation. Calf Veins: No evidence of thrombus. Normal compressibility and flow on color Doppler imaging. Superficial Great Saphenous Vein: No evidence of thrombus. Normal compressibility. Venous Reflux:  None. Other Findings: No evidence of superficial thrombophlebitis or abnormal fluid collection. LEFT LOWER EXTREMITY Common Femoral Vein: No evidence of thrombus. Normal compressibility, respiratory phasicity and response to augmentation. Saphenofemoral Junction: No evidence of thrombus. Normal compressibility and flow on color Doppler imaging. Profunda Femoral Vein: No evidence of thrombus. Normal compressibility and flow on color Doppler imaging. Femoral Vein: No evidence of thrombus. Normal compressibility, respiratory phasicity and response to augmentation. Popliteal Vein: No evidence of thrombus. Normal compressibility, respiratory phasicity and response to augmentation. Calf Veins: No evidence of thrombus. Normal compressibility and flow on color Doppler imaging. Superficial Great Saphenous Vein: No evidence of thrombus. Normal compressibility. Venous Reflux:  None. Other Findings: No evidence of superficial thrombophlebitis or abnormal fluid collection. IMPRESSION: No evidence of deep venous thrombosis in either lower extremity. Electronically Signed   By: Aletta Edouard M.D.   On: 10/11/2021  16:57   ECHOCARDIOGRAM LIMITED BUBBLE STUDY  Result Date: 10/11/2021    ECHOCARDIOGRAM LIMITED REPORT   Patient Name:   JAGO CARTON Date of Exam: 10/11/2021 Medical Rec #:  [id number]        Height:       69.0 in Accession #:    [id number]       Weight:       201.3 lb Date of Birth:  [birth year]        BSA:          2.072 m Patient Age:    3 years         BP:           140/75 mmHg Patient Gender: M                HR:           103 bpm. Exam Location:  [facility] Procedure: Limited Echo and Saline Contrast Bubble Study Indications:     I63.9 Stroke  History:         Patient has prior history of Echocardiogram examinations, most                  recent 10/08/2021. Risk Factors:Hypertension.  Sonographer:     Charmayne Sheer Referring Phys:  [place] Diagnosing Phys: Ida Rogue MD IMPRESSIONS  1. Left ventricular ejection fraction, by estimation, is 60 to 65%. The left ventricle has normal function. The left ventricle has no regional wall motion abnormalities. Left ventricular diastolic parameters are indeterminate.  2. Right ventricular systolic function is normal. The right ventricular size is normal  3. Agitated saline contrast bubble study was negative, with no evidence of any interatrial shun  4. Limited study FINDINGS  Left Ventricle:  Left ventricular ejection fraction, by estimation, is 60 to 65%. The left ventricle has normal function. The left ventricle has no regional wall motion abnormalities. The left ventricular internal cavity size was normal in size. There is  no left ventricular hypertrophy. Left ventricular diastolic parameters are indeterminate. Right Ventricle: The right ventricular size is normal. No increase in right ventricular wall thickness. Right ventricular systolic function is normal. Left Atrium: Left atrial size was normal in size. Right Atrium: Right atrial size was normal in size. Pericardium: There is no evidence of pericardial effusion. Mitral Valve: The mitral valve was  not well visualized. No evidence of mitral valve stenosis. Tricuspid Valve: The tricuspid valve is not well visualized. Tricuspid valve regurgitation is not demonstrated. No evidence of tricuspid stenosis. Aortic Valve: The aortic valve was not well visualized. Aortic valve regurgitation is not visualized. No aortic stenosis is present. Pulmonic Valve: The pulmonic valve was not well visualized. Pulmonic valve regurgitation is not visualized. No evidence of pulmonic stenosis. Aorta: The aortic root was not well visualized. Venous: The pulmonary veins were not well visualized. The inferior vena cava was not well visualized. IAS/Shunts: No atrial level shunt detected by color flow Doppler. Agitated saline contrast was given intravenously to evaluate for intracardiac shunting. Agitated saline contrast bubble study was negative, with no evidence of any interatrial shunt. Ida Rogue MD Electronically signed by Ida Rogue MD Signature Date/Time: 10/11/2021/11:41:14 AM    Final    DG Chest Port 1 View  Result Date: 10/11/2021 CLINICAL DATA:  Respiratory failure.  Metastatic prostate cancer. EXAM: PORTABLE CHEST 1 VIEW COMPARISON:  Chest x-ray dated 10/09/2021. FINDINGS: Cardiomediastinal silhouette is stable in size and configuration. LEFT-sided PICC line is stable in position with tip at the level of the lower SVC/cavoatrial junction. Study is hypoinspiratory with crowding of the bibasilar markings, RIGHT greater than LEFT. Lungs appear clear. No pleural effusion or pneumothorax is seen. IMPRESSION: Low lung volumes. No evidence of pneumonia or pulmonary edema. Electronically Signed   By: Franki Cabot M.D.   On: 10/11/2021 08:09   CT ANGIO HEAD NECK W WO CM  Result Date: 10/10/2021 CLINICAL DATA:  Left hand paresthesia.  Metastatic prostate cancer. EXAM: CT ANGIOGRAPHY HEAD AND NECK TECHNIQUE: Multidetector CT imaging of the head and neck was performed using the standard protocol during bolus  administration of intravenous contrast. Multiplanar CT image reconstructions and MIPs were obtained to evaluate the vascular anatomy. Carotid stenosis measurements (when applicable) are obtained utilizing NASCET criteria, using the distal internal carotid diameter as the denominator. RADIATION DOSE REDUCTION: This exam was performed according to the departmental dose-optimization program which includes automated exposure control, adjustment of the mA and/or kV according to patient size and/or use of iterative reconstruction technique. CONTRAST:  26m OMNIPAQUE IOHEXOL 350 MG/ML SOLN COMPARISON:  None Available. FINDINGS: CT HEAD FINDINGS Brain: There is no mass, hemorrhage or extra-axial collection. The size and configuration of the ventricles and extra-axial CSF spaces are normal. There is no acute or chronic infarction. The brain parenchyma is normal. Skull: The visualized skull base, calvarium and extracranial soft tissues are normal. Sinuses/Orbits: No fluid levels or advanced mucosal thickening of the visualized paranasal sinuses. No mastoid or middle ear effusion. The orbits are normal. CTA NECK FINDINGS SKELETON: Numerous sclerotic lesions throughout the cervical spine. OTHER NECK: Normal pharynx, larynx and major salivary glands. No cervical lymphadenopathy. Unremarkable thyroid gland. UPPER CHEST: Right nodal lobe atelectasis and hazy opacities in the right upper lobe. AORTIC ARCH: There is calcific  atherosclerosis of the aortic arch. There is no aneurysm, dissection or hemodynamically significant stenosis of the visualized portion of the aorta. Conventional 3 vessel aortic branching pattern. The visualized proximal subclavian arteries are widely patent. RIGHT CAROTID SYSTEM: Normal without aneurysm, dissection or stenosis. LEFT CAROTID SYSTEM: Normal without aneurysm, dissection or stenosis. VERTEBRAL ARTERIES: Right dominant configuration. Both origins are clearly patent. There is no dissection, occlusion  or flow-limiting stenosis to the skull base (V1-V3 segments). CTA HEAD FINDINGS POSTERIOR CIRCULATION: --Vertebral arteries: Normal V4 segments. --Inferior cerebellar arteries: Normal. --Basilar artery: Normal. --Superior cerebellar arteries: Normal. --Posterior cerebral arteries (PCA): Normal. ANTERIOR CIRCULATION: --Intracranial internal carotid arteries: Normal. --Anterior cerebral arteries (ACA): Normal. Both A1 segments are present. Patent anterior communicating artery (a-comm). --Middle cerebral arteries (MCA): Normal. VENOUS SINUSES: As permitted by contrast timing, patent. ANATOMIC VARIANTS: None Review of the MIP images confirms the above findings. Review of the MIP images confirms the above findings IMPRESSION: 1. No emergent large vessel occlusion, hemodynamically significant stenosis, aneurysm or dissection of the major cervical or intracranial arteries. 2. Numerous sclerotic lesions throughout the cervical spine, consistent with osseous metastatic disease. 3. Right middle lobe atelectasis and hazy opacities in the right upper lobe, possibly infection or asymmetric pulmonary edema. 4. Aortic Atherosclerosis (ICD10-I70.0). Electronically Signed   By: Ulyses Jarred M.D.   On: 10/10/2021 21:55   DG Chest Port 1 View  Result Date: 10/09/2021 CLINICAL DATA:  Respiratory failure EXAM: PORTABLE CHEST 1 VIEW COMPARISON:  Film from the previous day. FINDINGS: Cardiac shadow is stable. Endotracheal tube and gastric catheter have been removed in the interval. Left jugular central line is again seen. Lungs are clear. No bony abnormality is noted. IMPRESSION: No acute abnormality noted. Electronically Signed   By: Inez Catalina M.D.   On: 10/09/2021 21:41   MR BRAIN W WO CONTRAST  Result Date: 10/08/2021 CLINICAL DATA:  Extensive metastatic disease with confusion and lethargy. EXAM: MRI HEAD WITHOUT AND WITH CONTRAST TECHNIQUE: Multiplanar, multiecho pulse sequences of the brain and surrounding structures were  obtained without and with intravenous contrast. CONTRAST:  72m GADAVIST GADOBUTROL 1 MMOL/ML IV SOLN COMPARISON:  None Available. FINDINGS: Brain: There is a small focus of diffusion restriction in the right parietal cortex with faint corresponding FLAIR signal abnormality but no abnormal enhancement most in keeping with a small acute infarct. There is no associated hemorrhage or mass effect. There is no other evidence of acute infarct. There is no acute intracranial hemorrhage or extra-axial fluid collection. Background parenchymal volume is normal. The ventricles are normal in size. Gray-white differentiation is preserved. Scattered small foci of FLAIR signal abnormality in the subcortical and periventricular white matter are nonspecific but likely reflects sequela of mild chronic white matter microangiopathy There is no abnormal parenchymal enhancement there is no solid parenchymal lesion. There is calvarial signal abnormality bilaterally likely reflecting osseous metastatic disease. There is intracranial enhancement along the right frontal convexity likely reflecting extraosseous tumor measuring up to 5 mm in thickness. There is additional thin pachymeningeal thickening and enhancement over the right cerebral convexity suspicious for additional tumor extension. There is extracranial extension of tumor into the soft tissues of the overlying scalp measuring up to 3 mm in thickness. Additional extracranial tumor is seen along the scalp at the vertex measuring up to 7 mm in thickness. There is no mass effect on the underlying brain parenchyma due to the intracranial tumor. There is no midline shift. Vascular: Normal flow voids. Skull and upper cervical spine: Osseous metastatic disease  is seen in the calvarium with extraosseous extension as described above. Sinuses/Orbits: There is extensive fluid in the paranasal sinuses likely related to instrumentation. The globes and orbits are unremarkable. Other: None.  IMPRESSION: 1. 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 There is intracranial extension of tumor along the right frontal convexity measuring up to 5 mm in thickness without mass effect on the underlying brain parenchyma or midline shift. Extracranial extension of tumor in this location measures up to approximately 3 mm. 2. Additional extracranial extension of tumor into the scalp along the vertex measuring up to 7 mm in thickness. 3. Small focus of diffusion restriction in the right parietal cortex most in keeping with a small acute infarct. 4. No evidence of parenchymal metastatic disease. Electronically Signed   By: Valetta Mole M.D.   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 Postop day 1 with worsening shock, confusion, lethargy. EXAM: MRI THORACIC AND LUMBAR SPINE WITHOUT AND WITH CONTRAST TECHNIQUE: Multiplanar and multiecho pulse sequences of the thoracic and lumbar spine were obtained without and with intravenous contrast. CONTRAST:  72m GADAVIST GADOBUTROL 1 MMOL/ML IV SOLN COMPARISON:  Cervical spine MRI dated 1 day prior, CT chest/abdomen/pelvis 10/05/2021 FINDINGS: MRI THORACIC SPINE FINDINGS A single sagittal T2 sequence through the C-spine was obtained. Postsurgical changes reflecting C6 laminectomy are noted. The spinal canal patency is unchanged compared to the postoperative study from 1 day prior. There is no definite cord signal abnormality. There is prevertebral edema which may be postsurgical in nature. Alignment: There is exaggerated thoracic kyphosis. There is no antero or retrolisthesis. Vertebrae: There is extensive signal abnormality throughout the thoracic spine consistent with extensive  osseous metastatic disease. There is no evidence of pathologic fracture. There is breakthrough of the posterior cortex with epidural tumor along the posterior endplates at T9, TU20 and T12 without no significant spinal canal stenosis or evidence of cord compression. Circumferential enhancement is also seen in the spinal canal at the cervicothoracic junction extending superiorly off the field of view and inferiorly to the T3-T4 level likely reflecting epidural tumor also without high-grade spinal canal stenosis or cord compression (for example 27-2, 27-6). Cord: Normal in signal and morphology. There is no abnormal cord enhancement. There is scallop shaped T1 isointense material in the dorsal canal along the cord beginning at the T9-T10 level extending inferiorly into the lumbar spine which anteriorly displaces the cord and cauda equina nerve roots, likely subdural in location of uncertain etiology. The collection measures up to 7 mm in maximal thickness in the axial plane (see image 24-33, 20-9). There is no peripheral enhancement to suggest abscess. Paraspinal and other soft tissues: There is consolidation in the lower lobes, right worse than left, new since the prior CT from 08/12. The paraspinal soft tissues are unremarkable. Disc levels: There is overall mild background degenerative change throughout the thoracic spine. There is no significant disc herniation or spinal canal or neural foraminal stenosis. MRI LUMBAR SPINE FINDINGS Segmentation: Standard; the lowest formed disc space is designated L5-S1. Alignment:  There is no significant antero or retrolisthesis. Vertebrae: There is extensive signal abnormality throughout the lumbar spine and imaged pelvis consistent with extensive osseous metastatic disease, most notably at L1 and L2. There is epidural  extension of tumor most notably at L1 where it measures up to 5 mm in thickness and at L2 particularly in the region of the right subarticular zone and along the  right lamina (7-14); however, circumferential enhancement extends inferiorly throughout the lumbar spine into the sacrum likely reflecting additional epidural tumor. The epidural tumor at L1 results in mild canal narrowing without cord/conus compression. The epidural tumor lower in the lumbar spine superimposed on pre-existing degenerative changes contribute to multilevel spinal canal stenosis as described in detail below. There is no evidence of pathologic fracture. Conus medullaris: Extends to the T12-L1 level. There is crowding of the cauda equina nerve roots due to the above-described presumed subdural fluid collection. There is no abnormal enhancement of the cauda equina nerve roots. Paraspinal and other soft tissues: The paraspinal soft tissues are unremarkable. Disc levels: T12-L1: No significant spinal canal or neural foraminal stenosis L1-L2: Disc degeneration without significant spinal canal or neural foraminal stenosis L2-L3: Disc degeneration and bilateral facet arthropathy result in mild bilateral subarticular zone narrowing without significant neural foraminal stenosis. L3-L4: There is diffuse disc bulge and moderate bilateral facet arthropathy with ligamentum flavum thickening as well as probable epidural tumor resulting in severe spinal canal stenosis with cauda equina nerve root compression and moderate left and mild right neural foraminal stenosis L4-L5: There is diffuse disc bulge and moderate bilateral facet arthropathy as well as probable epidural tumor most notably along the dorsal canal resulting in severe spinal canal stenosis with cauda equina nerve root compression and moderate left and mild right neural foraminal stenosis L5-S1: There is a diffuse disc bulge and bilateral facet arthropathy as well as epidural tumor along the dorsal and ventral canal resulting in moderate spinal canal stenosis with bilateral subarticular zone narrowing and moderate left worse than right neural foraminal  stenosis. IMPRESSION: 1. 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 2. 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 3. Additional epidural tumor along the posterior endplates at T9, Z61, and T12 without high-grade spinal canal stenosis or cord compression. 4. Extensive epidural tumor in the lumbar spine is greatest in thickness at L1 without high-grade spinal canal stenosis at this level; however, superimposed on pre-existing degenerative changes lower in the lumbar spine results in severe spinal canal stenosis with cauda equina nerve root compression at L3-L4 and L4-L5. Multilevel neural foraminal stenosis is detailed above. 5. Postsurgical changes reflecting C6 laminectomy unchanged spinal canal stenosis compared to the postoperative study from 1 day prior. No definite cord signal abnormality. 6. Extramedullary fluid collection along the dorsal aspect of the cord beginning at T9-T10 extending into the lumbar spine is likely subdural in location but is of uncertain etiology; evolving blood products not excluded. The collection anteriorly displaces the cord and cauda equina nerve roots without frank cord compression or signal abnormality. There is no peripheral enhancement to suggest abscess. 7. Prevertebral edema in the cervical spine which may be postsurgical in nature. 8. New bilateral lower lobe consolidations may reflect atelectasis or developing pneumonia/aspiration. Electronically Signed   By: Valetta Mole M.D.   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 Postop  day  1 with worsening shock, confusion, lethargy. EXAM: MRI THORACIC AND LUMBAR SPINE WITHOUT AND WITH CONTRAST TECHNIQUE: Multiplanar and multiecho pulse sequences of the thoracic and lumbar spine were obtained without and with intravenous contrast. CONTRAST:  30m GADAVIST GADOBUTROL 1 MMOL/ML IV SOLN COMPARISON:  Cervical spine MRI dated 1 day prior, CT chest/abdomen/pelvis 10/05/2021 FINDINGS: MRI THORACIC SPINE FINDINGS A single sagittal T2 sequence through the C-spine was obtained. Postsurgical changes reflecting C6 laminectomy are noted. The spinal canal patency is unchanged compared to the postoperative study from 1 day prior. There is no definite cord signal abnormality. There is prevertebral edema which may be postsurgical in nature. Alignment: There is exaggerated thoracic kyphosis. There is no antero or retrolisthesis. Vertebrae: There is extensive signal abnormality throughout the thoracic spine consistent with extensive osseous metastatic disease. There is no evidence of pathologic fracture. There is breakthrough of the posterior cortex with epidural tumor along the posterior endplates at T9, TH41 and T12 without no significant spinal canal stenosis or evidence of cord compression. Circumferential enhancement is also seen in the spinal canal at the cervicothoracic junction extending superiorly off the field of view and inferiorly to the T3-T4 level likely reflecting epidural tumor also without high-grade spinal canal stenosis or cord compression (for example 27-2, 27-6). Cord: Normal in signal and morphology. There is no abnormal cord enhancement. There is scallop shaped T1 isointense material in the dorsal canal along the cord beginning at the T9-T10 level extending inferiorly into the lumbar spine which anteriorly displaces the cord and cauda equina nerve roots, likely subdural in location of uncertain etiology. The collection measures up to 7 mm in maximal thickness in the axial plane (see image  24-33, 20-9). There is no peripheral enhancement to suggest abscess. Paraspinal and other soft tissues: There is consolidation in the lower lobes, right worse than left, new since the prior CT from 08/12. The paraspinal soft tissues are unremarkable. Disc levels: There is overall mild background degenerative change throughout the thoracic spine. There is no significant disc herniation or spinal canal or neural foraminal stenosis. MRI LUMBAR SPINE FINDINGS Segmentation: Standard; the lowest formed disc space is designated L5-S1. Alignment:  There is no significant antero or retrolisthesis. Vertebrae: There is extensive signal abnormality throughout the lumbar spine and imaged pelvis consistent with extensive osseous metastatic disease, most notably at L1 and L2. There is epidural extension of tumor most notably at L1 where it measures up to 5 mm in thickness and at L2 particularly in the region of the right subarticular zone and along the right lamina (7-14); however, circumferential enhancement extends inferiorly throughout the lumbar spine into the sacrum likely reflecting additional epidural tumor. The epidural tumor at L1 results in mild canal narrowing without cord/conus compression. The epidural tumor lower in the lumbar spine superimposed on pre-existing degenerative changes contribute to multilevel spinal canal stenosis as described in detail below. There is no evidence of pathologic fracture. Conus medullaris: Extends to the T12-L1 level. There is crowding of the cauda equina nerve roots due to the above-described presumed subdural fluid collection. There is no abnormal enhancement of the cauda equina nerve roots. Paraspinal and other soft tissues: The paraspinal soft tissues are unremarkable. Disc levels: T12-L1: No significant spinal canal or neural foraminal stenosis L1-L2: Disc degeneration without significant spinal canal or neural foraminal stenosis L2-L3: Disc degeneration and bilateral facet  arthropathy result in mild bilateral subarticular zone narrowing without significant neural foraminal stenosis. L3-L4: There is diffuse disc bulge and moderate bilateral facet arthropathy with  ligamentum flavum thickening as well as probable epidural tumor resulting in severe spinal canal stenosis with cauda equina nerve root compression and moderate left and mild right neural foraminal stenosis L4-L5: There is diffuse disc bulge and moderate bilateral facet arthropathy as well as probable epidural tumor most notably along the dorsal canal resulting in severe spinal canal stenosis with cauda equina nerve root compression and moderate left and mild right neural foraminal stenosis L5-S1: There is a diffuse disc bulge and bilateral facet arthropathy as well as epidural tumor along the dorsal and ventral canal resulting in moderate spinal canal stenosis with bilateral subarticular zone narrowing and moderate left worse than right neural foraminal stenosis. IMPRESSION: 1. 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 2. 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 3. Additional epidural tumor along the posterior endplates at T9, P38, and T12 without high-grade spinal canal stenosis or cord compression. 4. Extensive epidural tumor in the lumbar spine is greatest in thickness at L1 without high-grade spinal canal stenosis at this level; however, superimposed on pre-existing degenerative changes lower in the lumbar spine results in severe spinal canal stenosis with cauda equina nerve root compression at L3-L4 and L4-L5. Multilevel neural foraminal stenosis is detailed above. 5. Postsurgical changes reflecting C6 laminectomy unchanged spinal canal stenosis compared to the postoperative study from 1 day prior. No definite cord  signal abnormality. 6. Extramedullary fluid collection along the dorsal aspect of the cord beginning at T9-T10 extending into the lumbar spine is likely subdural in location but is of uncertain etiology; evolving blood products not excluded. The collection anteriorly displaces the cord and cauda equina nerve roots without frank cord compression or signal abnormality. There is no peripheral enhancement to suggest abscess. 7. Prevertebral edema in the cervical spine which may be postsurgical in nature. 8. New bilateral lower lobe consolidations may reflect atelectasis or developing pneumonia/aspiration. Electronically Signed   By: Valetta Mole M.D.   On: 10/08/2021 13:18   ECHOCARDIOGRAM COMPLETE  Result Date: 10/08/2021    ECHOCARDIOGRAM REPORT   Patient Name:   ELIYAS SUDDRETH Date of Exam: 10/08/2021 Medical Rec #:  [id number]        Height:       69.0 in Accession #:    [id number]       Weight:       201.3 lb Date of Birth:  April 15, 1955        BSA:          2.072 m Patient Age:    72 years         BP:           116/51 mmHg Patient Gender: M                HR:           56 bpm. Exam Location:  [facility] Procedure: 2D Echo, Cardiac Doppler and Color Doppler Indications:     Other abnormalities of the heart 00.8  History:         Patient has prior history of Echocardiogram examinations, most                  recent 07/19/2020. Risk Factors:Hypertension. GERD.  Sonographer:     Sherrie Sport Referring Phys:  [id number] Emeterio Reeve Diagnosing Phys: Isaias Cowman MD  Sonographer Comments: Technically challenging study due to  limited acoustic windows, echo performed with patient supine and on artificial respirator, no subcostal window and no parasternal window. IMPRESSIONS  1. Left ventricular ejection fraction, by estimation, is 55 to 60%. The left ventricle has normal function. The left ventricle has no regional wall motion abnormalities. Left ventricular diastolic parameters were normal.  2. Right ventricular  systolic function is normal. The right ventricular size is normal.  3. The mitral valve is normal in structure. Mild mitral valve regurgitation. No evidence of mitral stenosis.  4. The aortic valve is normal in structure. Aortic valve regurgitation is not visualized. Mild aortic valve stenosis.  5. The inferior vena cava is normal in size with greater than 50% respiratory variability, suggesting right atrial pressure of 3 mmHg. FINDINGS  Left Ventricle: Left ventricular ejection fraction, by estimation, is 55 to 60%. The left ventricle has normal function. The left ventricle has no regional wall motion abnormalities. The left ventricular internal cavity size was normal in size. There is  no left ventricular hypertrophy. Left ventricular diastolic parameters were normal. Right Ventricle: The right ventricular size is normal. No increase in right ventricular wall thickness. Right ventricular systolic function is normal. Left Atrium: Left atrial size was normal in size. Right Atrium: Right atrial size was normal in size. Pericardium: There is no evidence of pericardial effusion. Mitral Valve: The mitral valve is normal in structure. Mild mitral valve regurgitation. No evidence of mitral valve stenosis. Tricuspid Valve: The tricuspid valve is normal in structure. Tricuspid valve regurgitation is mild . No evidence of tricuspid stenosis. Aortic Valve: The aortic valve is normal in structure. Aortic valve regurgitation is not visualized. Mild aortic stenosis is present. Aortic valve mean gradient measures 7.0 mmHg. Aortic valve peak gradient measures 11.6 mmHg. Aortic valve area, by VTI measures 2.09 cm. Pulmonic Valve: The pulmonic valve was normal in structure. Pulmonic valve regurgitation is not visualized. No evidence of pulmonic stenosis. Aorta: The aortic root is normal in size and structure. Venous: The inferior vena cava is normal in size with greater than 50% respiratory variability, suggesting right atrial  pressure of 3 mmHg. IAS/Shunts: No atrial level shunt detected by color flow Doppler.  LEFT VENTRICLE PLAX 2D LVIDd:         3.90 cm   Diastology LVIDs:         2.80 cm   LV e' medial:    6.85 cm/s LV PW:         1.00 cm   LV E/e' medial:  15.6 LV IVS:        1.40 cm   LV e' lateral:   12.70 cm/s LVOT diam:     2.10 cm   LV E/e' lateral: 8.4 LV SV:         65 LV SV Index:   31 LVOT Area:     3.46 cm  RIGHT VENTRICLE RV Basal diam:  3.70 cm RV S prime:     16.80 cm/s TAPSE (M-mode): 2.1 cm LEFT ATRIUM             Index        RIGHT ATRIUM           Index LA diam:        4.50 cm 2.17 cm/m   RA Area:     19.50 cm LA Vol (A2C):   62.1 ml 29.98 ml/m  RA Volume:   51.40 ml  24.81 ml/m LA Vol (A4C):   73.2 ml 35.33 ml/m LA Biplane Vol:  70.9 ml 34.22 ml/m  AORTIC VALVE AV Area (Vmax):    1.75 cm AV Area (Vmean):   1.87 cm AV Area (VTI):     2.09 cm AV Vmax:           170.33 cm/s AV Vmean:          115.967 cm/s AV VTI:            0.312 m AV Peak Grad:      11.6 mmHg AV Mean Grad:      7.0 mmHg LVOT Vmax:         86.20 cm/s LVOT Vmean:        62.500 cm/s LVOT VTI:          0.188 m LVOT/AV VTI ratio: 0.60 MITRAL VALVE                TRICUSPID VALVE MV Area (PHT): 3.15 cm     TR Peak grad:   12.4 mmHg MV Decel Time: 241 msec     TR Vmax:        176.00 cm/s MV E velocity: 107.00 cm/s MV A velocity: 102.00 cm/s  SHUNTS MV E/A ratio:  1.05         Systemic VTI:  0.19 m                             Systemic Diam: 2.10 cm Isaias Cowman MD Electronically signed by Isaias Cowman MD Signature Date/Time: 10/08/2021/1:07:52 PM    Final    DG Chest Port 1 View  Result Date: 10/08/2021 CLINICAL DATA:  Difficulty breathing EXAM: PORTABLE CHEST 1 VIEW COMPARISON:  Previous studies including the examination of 10/07/2021 FINDINGS: Cardiac size is within normal limits. Thoracic aorta is tortuous and ectatic. Tip of endotracheal tube is 4.5 cm above the carina. Enteric tube is noted traversing the esophagus with distal  portion of the tube in the stomach. Tip of left IJ central venous catheter is seen in right atrium. There are no signs of pulmonary edema. There is poor inspiration. No new focal pulmonary consolidation is seen. There is improvement in aeration of lower lung fields. There is no significant pleural effusion or pneumothorax. Left shoulder arthroplasty is noted. IMPRESSION: There are no signs of pulmonary edema or focal pulmonary consolidation. There is improvement in aeration of lower lung fields suggesting resolving subsegmental atelectasis. Electronically Signed   By: Elmer Picker M.D.   On: 10/08/2021 08:28   DG Abd 1 View  Result Date: 10/07/2021 CLINICAL DATA:  Nasogastric tube placement. EXAM: ABDOMEN - 1 VIEW COMPARISON:  October 07, 2021 (6:31 p.m.) FINDINGS: A nasogastric tube is seen with its distal tip seen within the body of the stomach. Properly positioned endotracheal tube and left internal jugular venous catheter are also seen. The bowel gas pattern within the upper abdomen is normal. The remainder of the abdomen and pelvis are not included in the field of view and are limited in evaluation. IMPRESSION: Nasogastric tube positioning, as described above. Electronically Signed   By: Virgina Norfolk M.D.   On: 10/07/2021 21:45   MR CERVICAL SPINE WO CONTRAST  Result Date: 10/07/2021 CLINICAL DATA:  Unable to move legs. Posterior cervical decompression yesterday for metastatic disease. EXAM: MRI CERVICAL SPINE WITHOUT CONTRAST TECHNIQUE: Multiplanar, multisequence MR imaging of the cervical spine was performed. No intravenous contrast was administered. COMPARISON:  CT cervical spine dated October 05, 2021. MRI cervical spine dated October 03, 2021. FINDINGS: Alignment: New straightening of the normal cervical lordosis. No significant listhesis. Vertebrae: Similar abnormal marrow signal involving the C4 posterior elements and C5 through T2 vertebral bodies and posterior elements. Cord: No  abnormal signal. Dorsal epidural tumor at C4 is unchanged. Circumferential epidural tumor from C5-C7 appears similar. No definite superimposed epidural fluid collection. Posterior Fossa, vertebral arteries, paraspinal tissues: Postsurgical changes in the paraspinous soft tissues. No fluid collection. Mild prevertebral soft tissue swelling. Disc levels: C2-C3: Negative disc. Unchanged moderate left and mild right facet arthropathy. No stenosis. C3-C4: Unchanged small posterior disc osteophyte complex, mild bilateral uncovertebral hypertrophy, and moderate left and mild right facet arthropathy. Unchanged mild spinal canal stenosis. Unchanged moderate left neuroforaminal stenosis. No right neuroforaminal stenosis. C4-C5: Unchanged mild disc bulging and moderate right facet uncovertebral hypertrophy. Similar dorsal epidural tumor. New moderate spinal canal stenosis. Unchanged mild bilateral neuroforaminal stenosis. C5-C6: Interval bilateral C6 hemilaminectomies. Unchanged bulky posterior disc osteophyte complex and severe bilateral uncovertebral hypertrophy. Similar circumferential epidural tumor extending into both neural foramina. Progressive severe spinal canal stenosis. Unchanged biforaminal effacement. C6-C7: Unchanged small posterior disc osteophyte complex and severe bilateral uncovertebral hypertrophy. Similar circumferential epidural tumor extending into both neural foramina. Progressive severe spinal canal stenosis. Unchanged biforaminal effacement. C7-T1: Negative disc. Unchanged partial effacement of the left neural foramen due to tumor. No spinal canal stenosis. IMPRESSION: 1. Interval bilateral C6 hemilaminectomies. Similar degree of epidural tumor from C4-C7 without definite superimposed epidural hematoma or fluid collection. 2. Progressive spinal canal stenosis from C4-C7, severe at C5-C6 and C6-C7. No abnormal spinal cord signal or cord infarct. 3. Similar diffuse cervical osseous metastatic disease.  These results were called by telephone at the time of interpretation on 10/07/2021 at 8:05 pm to provider [facility] , who verbally acknowledged these results. Electronically Signed   By: Titus Dubin M.D.   On: 10/07/2021 20:48   DG Abd 1 View  Result Date: 10/07/2021 CLINICAL DATA:  Orogastric tube placement. EXAM: ABDOMEN - 1 VIEW COMPARISON:  None Available. FINDINGS: An enteric tube is seen with its distal tip approximately 3.6 cm distal to the expected region of the gastroesophageal junction. The bowel gas pattern is normal. A large stool burden is seen within the ascending and proximal transverse colon. No radio-opaque calculi or other significant radiographic abnormality are seen. IMPRESSION: 1. Enteric tube positioning, as described above. Further advancement of the NG tube by proximally 7 cm is recommended to decrease the risk of aspiration. 2. Large stool burden without evidence of bowel obstruction. Electronically Signed   By: Virgina Norfolk M.D.   On: 10/07/2021 18:45   DG Abd 1 View  Result Date: 10/07/2021 CLINICAL DATA:  Orogastric tube placement. EXAM: ABDOMEN - 1 VIEW COMPARISON:  October 07, 2021 FINDINGS: An enteric tube is seen with its distal tip approximately 3.4 cm distal to the expected region of the gastroesophageal junction. This is unchanged in position when compared to the prior study. The bowel gas pattern is normal. A large stool burden is seen within the ascending and proximal transverse colon. No radio-opaque calculi or other significant radiographic abnormality are seen. IMPRESSION: 1. Enteric tube positioning, as described above. Further advancement of the NG tube by approximately 6 cm is recommended to decrease the risk of aspiration. 2. Large stool burden within the ascending and proximal transverse colon, without evidence of bowel obstruction. Electronically Signed   By: Virgina Norfolk M.D.   On: 10/07/2021 18:43   DG Chest Port 1 View  Result Date:  10/07/2021 CLINICAL DATA:  Hypertension, postoperative day 1 for cervical spine surgery. EXAM: PORTABLE CHEST 1 VIEW COMPARISON:  10/07/2021 4:51 p.m. FINDINGS: An endotracheal tube is present with tip 3.6 cm above the carina. Left central line tip: Lower SVC. A drain is coiled over the right upper chest and lower neck. Reverse left shoulder arthroplasty. Low lung volumes are present, causing crowding of the pulmonary vasculature. Mild bandlike atelectasis in the perihilar regions and bases. No appreciable pneumothorax. There is some vague osseous densities likely reflecting the patient's known osseous metastatic disease. IMPRESSION: 1. Endotracheal tube placed with tip 3.6 cm above the carina. Left central line tip: Lower SVC. No pneumothorax. 2. Low lung volumes are present with scattered perihilar and basilar atelectasis. 3. Hazy osseous densities likely reflecting the patient's known osseous metastatic disease. Electronically Signed   By: Van Clines M.D.   On: 10/07/2021 18:35   DG Chest Port 1 View  Result Date: 10/07/2021 CLINICAL DATA:  Per MD note, sepsis.  [id number] EXAM: PORTABLE CHEST 1 VIEW COMPARISON:  Chest x-ray 03/11/2020, CT chest 10/05/2021 FINDINGS: Surgical drain and skin staples overly the neck. The heart and mediastinal contours are unchanged. Slightly low lung volumes. Curvilinear densities overlying the right lower lung zone that appear to extend along the anterior ribs likely related to known metastatic osseous disease. No definite focal consolidation. No pulmonary edema. No pleural effusion. No pneumothorax. No acute osseous abnormality. IMPRESSION: 1. Slightly low lung volumes with no acute cardiopulmonary abnormality. 2. Curvilinear densities overlying the right lower lung zone that appear to extend along the anterior ribs likely related to known metastatic osseous disease. Electronically Signed   By: Iven Finn M.D.   On: 10/07/2021 17:09   DG Cervical Spine 2-3  Views  Result Date: 10/06/2021 CLINICAL DATA:  Cervical surgery EXAM: CERVICAL SPINE - 2-3 VIEW COMPARISON:  CT 10/05/2021 FINDINGS: A single C-arm fluoroscopic image was obtained intraoperatively and submitted for post operative interpretation. Lateral image of the cervical spine with tissue spreaders present posteriorly. 2 seconds fluoroscopy time utilized. Radiation dose: 0.9 mGy. Please see the performing provider's procedural report for further detail. IMPRESSION: Intraoperative fluoroscopic image during cervical spine surgery. Electronically Signed   By: Davina Poke D.O.   On: 10/06/2021 09:28   DG C-Arm 1-60 Min-No Report  Result Date: 10/06/2021 Fluoroscopy was utilized by the requesting physician.  No radiographic interpretation.   CT CERVICAL SPINE WO CONTRAST  Result Date: 10/05/2021 CLINICAL DATA:  Metastatic disease of the cervical spine. Unknown primary. EXAM: CT CERVICAL SPINE WITHOUT CONTRAST TECHNIQUE: Multidetector CT imaging of the cervical spine was performed without intravenous contrast. Multiplanar CT image reconstructions were also generated. RADIATION DOSE REDUCTION: This exam was performed according to the departmental dose-optimization program which includes automated exposure control, adjustment of the mA and/or kV according to patient size and/or use of iterative reconstruction technique. COMPARISON:  MRI of the cervical spine without contrast 10/03/2021 FINDINGS: Alignment: Slight degenerative anterolisthesis again noted at C3-4. Alignment is otherwise anatomic. Reversal of the normal cervical lordosis is noted. Skull base and vertebrae: Craniocervical junction is within normal limits. Extensive heterogeneous sclerotic lesions are present in the cervical spine. Left-sided sclerotic lesions present at C5 and inferiorly at C4 corresponding 2 marrow signal changes on the MRI scan. Diffuse sclerotic changes present at C6 and C7. Mixed sclerotic and lytic changes are present in  the posterior elements at C5 and C6. No pathologic fractures are present. Heterogeneous sclerotic changes are present within the spinous process at  T1 and T2. Soft tissues and spinal canal: The extraosseous tumor at C5 and C6 is better appreciated on MRI. Prevertebral soft tissues are within normal limits. Disc levels: Central and foraminal narrowing is greatest at C5-6 and C6-7 is described on the MRI scan. Upper chest: Lung apices are clear. IMPRESSION: 1. Extensive heterogeneous sclerotic lesions throughout the cervical spine compatible with metastatic disease to the bone. 2. Mixed sclerotic and lytic changes in the posterior elements at C5 and C6. 3. Extraosseous tumor at C5 and C6 is better appreciated on the MRI scan. 4. No pathologic fractures. 5. Central and foraminal narrowing is greatest at C5-6 and C6-7 is described on the MRI scan. Electronically Signed   By: San Morelle M.D.   On: 10/05/2021 12:54   CT CHEST ABDOMEN PELVIS W CONTRAST  Result Date: 10/05/2021 CLINICAL DATA:  Metastatic disease evaluation. EXAM: CT CHEST, ABDOMEN, AND PELVIS WITH CONTRAST TECHNIQUE: Multidetector CT imaging of the chest, abdomen and pelvis was performed following the standard protocol during bolus administration of intravenous contrast. RADIATION DOSE REDUCTION: This exam was performed according to the departmental dose-optimization program which includes automated exposure control, adjustment of the mA and/or kV according to patient size and/or use of iterative reconstruction technique. CONTRAST:  183m OMNIPAQUE IOHEXOL 300 MG/ML  SOLN COMPARISON:  None Available. FINDINGS: CT CHEST FINDINGS Cardiovascular: No significant vascular findings. Normal heart size. No pericardial effusion. Mediastinum/Nodes: No enlarged mediastinal, hilar, or axillary lymph nodes. Thyroid gland, trachea, and esophagus demonstrate no significant findings. Lungs/Pleura: Mild lingular and mild right middle lobe linear atelectasis is  seen. There is no evidence of acute infiltrate, pleural effusion or pneumothorax. Musculoskeletal: A left shoulder replacement is seen with associated streak artifact. Hazy, sclerotic appearing areas are seen involving the anterolateral aspects of the second and fourth right ribs. Multilevel degenerative changes seen throughout the thoracic spine. CT ABDOMEN PELVIS FINDINGS Hepatobiliary: No focal liver abnormality is seen. No gallstones, gallbladder wall thickening, or biliary dilatation. Pancreas: Unremarkable. No pancreatic ductal dilatation or surrounding inflammatory changes. Spleen: Punctate calcified granulomas are seen within the posterior aspect of an otherwise normal-appearing spleen. Adrenals/Urinary Tract: Adrenal glands are unremarkable. Kidneys are normal, without renal calculi, focal lesion, or hydronephrosis. The urinary bladder is limited in evaluation secondary to the presence of overlying streak artifact. Stomach/Bowel: Stomach is within normal limits. The appendix is not identified. Stool is seen throughout the large bowel. No evidence of bowel wall thickening, distention, or inflammatory changes. Noninflamed diverticula are seen throughout the sigmoid colon. Vascular/Lymphatic: Aortic atherosclerosis. Moderate severity Para-aortic and aortocaval lymphadenopathy is seen. This extends into the pelvis along the bilateral iliac chains, right greater than left Reproductive: The prostate gland is markedly limited in evaluation secondary to the presence of overlying streak artifact. Other: No abdominal wall hernia or abnormality. No abdominopelvic ascites. 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 There is an extensive amount of associated streak artifact with subsequently limited evaluation of the adjacent osseous and soft tissue structures. Small lytic areas are seen at the levels of L4 and S1, with diffusely sclerotic changes seen throughout the remainder of the sacrum and left  iliac bone. A mixed cystic and sclerotic lesion is seen within the right iliac bone. Multilevel degenerative changes are seen throughout the lumbar spine. IMPRESSION: 1. Moderate severity retroperitoneal and pelvic lymphadenopathy, consistent with metastatic disease. 2. Small lytic areas at the levels of L4 and S1, with diffusely sclerotic changes involving the second and fourth right ribs, sacrum and left iliac bone. These  findings are likely consistent with osseous metastasis. Further evaluation with a whole body nuclear medicine bone scan is recommended. 3. 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 4. Sigmoid diverticulosis. 5. Aortic atherosclerosis. Aortic Atherosclerosis (ICD10-I70.0). Electronically Signed   By: Virgina Norfolk M.D.   On: 10/05/2021 01:56   MR CERVICAL SPINE WO CONTRAST  Result Date: 10/03/2021 CLINICAL DATA:  Neck and left shoulder pain EXAM: MRI CERVICAL SPINE WITHOUT CONTRAST TECHNIQUE: Multiplanar, multisequence MR imaging of the cervical spine was performed. No intravenous contrast was administered. COMPARISON:  None Available. FINDINGS: Alignment: Reversal of cervical lordosis. Mild anterolisthesis at C3-C4. Vertebrae: Multifocal abnormal marrow signal particularly involving C4-C7 and included upper thoracic vertebral bodies. This includes the posterior elements. Dorsal epidural soft tissue is present at C4. Circumferential involvement is present at C5-C7. There is marked canal stenosis dorsal to C6 without cord compression. Cord: No abnormal signal. Posterior Fossa, vertebral arteries, paraspinal tissues: Partially imaged paranasal sinus opacification. Disc levels: C2-C3:  Left facet hypertrophy.  No canal or foraminal stenosis. C3-C4: Disc bulge with endplate osteophytes. Left facet hypertrophy. Effacement of the ventral subarachnoid space without significant canal narrowing. No foraminal stenosis. C4-C5: Disc bulge with  superimposed small right foraminal protrusion and endplate osteophytes. Left greater than right facet hypertrophy. Dorsal epidural soft tissue. No canal stenosis. No left foraminal stenosis. Probable mild right foraminal stenosis secondary to extraosseous disease. C5-C6: Disc bulge with endplate osteophytes. Facet and uncovertebral hypertrophy. Circumferential epidural soft tissue. Moderate to marked canal stenosis at disc level. Effacement of left greater than right foramina due to extraosseous disease. C6-C7: Disc bulge with endplate osteophytes. Uncovertebral and facet hypertrophy. Moderate canal stenosis at disc level. Effacement of left greater than right foramina due to extraosseous disease. C7-T1: No canal or right foraminal stenosis. Partial effacement left foramen due to extraosseous disease. IMPRESSION: Multifocal abnormal marrow signal with relatively diffuse involvement of C4 into the thoracic spine. Associated epidural disease, greatest at C6 where there is marked canal stenosis without cord compression. Left greater than right foraminal effacement due to extraosseous disease. May reflect metastatic disease or marrow infiltrative neoplastic process. These results will be called to the ordering clinician or representative by the Radiologist Assistant, and communication documented in the PACS or [employer]. Electronically Signed   By: Macy Mis M.D.   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  Status post C6 laminectomy for resection of C6 epidural mass, PSA was obtained and level was >1500 Surgical pathology showed metastatic adenocarcinoma, compatible with prostatic primary. Imaging results and pathology results were reviewed with the patient and wife.  They were made aware that patient has extensive metastatic prostate cancer,  extremely high tumor burden and a poor prognosis. S/p Firmagon loading dose '240mg'$  x 1. Next dose is around 9/13.  He will need to follow up outpatient, likely not a candidate for docetaxel treatment.  Potentially candidate for antigen receptor blocker treatments.  Also PSMA outpatient.   #Anemia hemoglobin 7.7 multifactorial, infection, marrow suppression secondary to previous hypotension/shock 8/20 ferritin 984, iron saturation 11.  Reticulocyte hemoglobin decreased at 27.9.  Indicating possible iron deficiency.  Ferritin is elevated due to being acute phase reactant. Transfuse PRBC if hemoglobin drops below 7. Recommend patient to be started on oral iron supplementation.  He has received IV Feraheme  #Transaminitis, possibly secondary to shock liver.  Continue to  trend levels.   Earlie Server, MD, PhD Hematology Oncology 10/15/2021

## 2021-10-15 NOTE — Progress Notes (Signed)
History: Jason [name] is s/p C6 laminectomy for resection of epidural mass  POD9: NAEO  POD8: pt reports some improved breathing overnight  POD7:  Patient spiked fever.  Incision c/d/I.  Very low suspicion of SSI.  Likely aspiration pneumonia.   POD6:  No significant neurological changes. Patient subjectively feels hands are better.   POD5: no significant neurologic changes overnight, Continues to have anxiety.    POD4: continues to have weakness in his arms and legs.  He states he does have sensation in his legs however.  He also endorses significant anxiety regarding his recent metastatic cancer diagnosis   POD2: Pt was transferred to the ICU after yesterdays events. Pt remains intubated and sedated this morning. Wife at bedside.    POD1 PM: I was contacted to see Jason [name] at approximately 1615.  I saw him in his room at approximately 1620, and was able to review the clinical situation with his wife.  From review with her, it seems that Jason [name] was having some issues with his arms when she arrived at around 85, when he reported some difficulty feeding himself with lunch.  Per nursing, his nurses also noted that he was having some difficulty with movement of his arms and legs, but he improved with a fluid bolus around 1300.    When I saw him at 1620, he was arousable but not readily interactive with examination.  He moved his arms but was not reliable. He was noted to be significantly hypotensive.  ICU attending Dr. Tamala Julian, hospitalist Dr. Sheppard Coil, and I reviewed the case and all agreed transfer to ICU was appropriate.  This was done, and his blood pressure improved with another fluid bolus.   I then reevaluated him, noted below.   POD1 AM: Increased tingling in right hand this morning. Reports good pain control   Physical Exam:        Vitals:    10/11/21 0400 10/11/21 0826  BP: 124/76    Pulse: 89    Resp: (!) 24    Temp: 98.3 F (36.8 C)    SpO2: 93% 92%      AA  Ox3  CNI 4/5 bilateral deltoids 3/5 left triceps and 2/5 left bicep 3/5 right triceps and bicep. 2/5 bilateral HG. 1/5 left quad otherwise 0/5 bilateral legs Incision intact with staples in place and open to air.    Data:       Last Labs            Recent Labs  Lab 10/09/21 0429 10/10/21 0455 10/11/21 0500  NA 136 136 136  K 4.1 3.8 3.7  CL 108 107 106  CO2 '23 24 23  '$ BUN 29* 46* 32*  CREATININE 0.69 0.82 0.51*  GLUCOSE 130* 143* 93  CALCIUM 8.2* 8.2* 7.8*       Last Labs        Recent Labs  Lab 10/11/21 0500  AST 121*  ALT 134*  ALKPHOS 170*         Last Labs            Recent Labs  Lab 10/09/21 0429 10/10/21 0455 10/11/21 0500  WBC 16.4* 15.3* 13.9*  HGB 10.0* 8.8* 9.2*  HCT 30.4* 26.5* 28.2*  PLT 137* 175 173       Last Labs   No results for input(s): "APTT", "INR" in the last 168 hours.                Assessment/Plan:  Jason [name] is a 66 y.o s/p C6 laminectomy for resection of epidural mass.   - Appreciate ICU care and hospitalist care.   - some muscle spasm in right lower leg. Can consider Baclofen for this.  - Ok to leave incision open to air as long as it is not draining.  - Please call with any questions or concerns   Cooper Render PA-C Department of Neurosurgery

## 2021-10-15 NOTE — Progress Notes (Signed)
[facility] [place] NAME: Jason [name]    MR#:  [id number]  DATE OF BIRTH:  Jan 07, 1956  SUBJECTIVE:  Wife and pt's friend at bedside  Having a better day No cp or sob Worked with PT and OT today currently on 4 liter high flow nasal cannula oxygen. Sats remaining more than 92% wean in progress. Good BM's   VITALS:  Blood pressure 130/67, pulse 61, temperature 99.1 F (37.3 C), resp. rate (!) 22, height '5\' 9"'$  (1.753 m), weight 101.1 kg, SpO2 100 %.  PHYSICAL EXAMINATION:   GENERAL:  66 y.o.-year-old patient lying in the bed with no acute distress.  Looks fatigues LUNGS: Normal breath sounds bilaterally, no wheezing, rales, rhonchi.  CARDIOVASCULAR: S1, S2 normal. No murmurs, rubs, or gallops.  ABDOMEN: Soft, nontender, nondistended. Bowel sounds present. Foley+ EXTREMITIES:bilateral wrist and leg splints+ NEUROLOGIC: nonfocal  patient is alert and awake. Weak bilateral UE and LE  LABORATORY PANEL:  CBC Recent Labs  Lab 10/14/21 0452 10/15/21 0429  WBC 12.1*  --   HGB 7.6* 7.7*  HCT 23.5*  --   PLT 200  --      Chemistries  Recent Labs  Lab 10/14/21 0452  NA 139  K 4.2  CL 114*  CO2 21*  GLUCOSE 102*  BUN 20  CREATININE 0.55*  CALCIUM 8.2*  MG 2.4  AST 102*  ALT 125*  ALKPHOS 165*  BILITOT 0.9    Cardiac Enzymes No results for input(s): "TROPONINI" in the last 168 hours. RADIOLOGY:  No results found.  Assessment and Plan  66 year old WM PMHx essential HTN, unstable angina, PVC, PSVT, NSVT, hypothyroidism, GERD, BPH, prostate cancer metastatic to multiple sites,    Presented with neck/shoulder pain found to have widely metastatic cancer suspicious for prostate He was found to have widely metastatic prostate cancer including calvarial mets and epidural spinal tumor, now s/p emergent C6 cervical laminectomy for resection of the epidural tumor. Surgery was on 8/13. Palliative radiation therapy is planned to  cervical spine. Neurology is requested to consult regarding MRI finding on 8/15 of small acute ischemic R parietal infarct (TTE 8/15 showed no e/o intracardiac clot but did not include bubble study s/p emergent C6 cervical laminectomy with resection of epidural spinal tumor  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 worsening shock despite fluids and post op resp failure now extubation, high risk for aspiration  Extensive metastasis epidural space, spine, calvarium, pelvis- with significant neurological deficits,  -8/13 s/p cervical tumor debulking and canal decompression -Per PCCM note patient to follow with oncology and radiation oncology. - when more stable officially consult radiation oncology  Severe ACUTE Hypoxic and Hypercapnic Respiratory Failure -Extubated -High risk for aspiration and re-intubation -Chest PT okay by neuro -Incentive spirometry - Flutter valve - DuoNeb qid --weaning HFNC down. Sats stable  - Sepsis pneumonia/Aspiration pneumonia -8/19 PCXR increase bibasilar atelectasis possible RLL aspiration -Cont IV unasyn --wbc 12K --low grade fever  --speech evaluation appreciated. No overt signs of aspiration noted. Continue aspiration precaution.  Hypotension--improved --cont midodrine  Shock -Most likely secondary to extensive metastatic disease -Vasopressors discontinued - Resolved   RIGHT parietal stroke - Neurology consulted -Posterior cervical incision management and MAP goal> 65 per NSGY -Wean off dexamethasone to promote wound healing. --recommends ASA to be started (hgb 7.6)   Anemia unspecified/IDA -8/20 discussed  case with Dr. Su Monks neurology given patient's anemia will hold off on starting aspirin until at least 8/21 to ensure hemoglobin stable --8/21--iron studies  abnormal. Will d/w  pt and wife regarding IV iron --no active bleeding reported -  transfuse for hemoglobin<7 --d/w pt and wife ok to take IV iron and po iron  Anxiety - Clonazepam 0.5 mg BID - Prozac 20 mg daily  Overall slowly moving well. Will transfer out of ICU   Procedures:as above Family communication :wife at bedside Consults : neurosurgery, neurology, PCCM CODE STATUS:  FULL DVT Prophylaxis :enoxaparin Level of care: Stepdown Status is: Inpatient Remains inpatient appropriate because: Patient not medically stable to DC. Multiple medical issues.    TOTAL TIME TAKING CARE OF THIS PATIENT: 35 minutes.  >50% time spent on counselling and coordination of care  Note: This dictation was prepared with Dragon dictation along with smaller phrase technology. Any transcriptional errors that result from this process are unintentional.  Fritzi Mandes M.D    [facility]   CC: Primary care physician; Default, Provider, MD

## 2021-10-15 NOTE — Progress Notes (Signed)
Patient had an uneventful night. Slept through the night, awakened minimally for vital signs, turning and repositioning. Oxygen flow reduced to 6L via HFNC. Prafo boots in place for 4 hour then removed. Prevalon boots on most of the night with SCDs. Will endorse.

## 2021-10-16 DIAGNOSIS — D539 Nutritional anemia, unspecified: Secondary | ICD-10-CM

## 2021-10-16 DIAGNOSIS — M4802 Spinal stenosis, cervical region: Secondary | ICD-10-CM | POA: Diagnosis not present

## 2021-10-16 DIAGNOSIS — C61 Malignant neoplasm of prostate: Secondary | ICD-10-CM | POA: Diagnosis not present

## 2021-10-16 DIAGNOSIS — R7401 Elevation of levels of liver transaminase levels: Secondary | ICD-10-CM

## 2021-10-16 DIAGNOSIS — D649 Anemia, unspecified: Secondary | ICD-10-CM

## 2021-10-16 DIAGNOSIS — I63521 Cerebral infarction due to unspecified occlusion or stenosis of right anterior cerebral artery: Secondary | ICD-10-CM | POA: Diagnosis not present

## 2021-10-16 LAB — COMPREHENSIVE METABOLIC PANEL
ALT: 112 U/L — ABNORMAL HIGH (ref 0–44)
AST: 76 U/L — ABNORMAL HIGH (ref 15–41)
Albumin: 2.7 g/dL — ABNORMAL LOW (ref 3.5–5.0)
Alkaline Phosphatase: 315 U/L — ABNORMAL HIGH (ref 38–126)
Anion gap: 6 (ref 5–15)
BUN: 16 mg/dL (ref 8–23)
CO2: 23 mmol/L (ref 22–32)
Calcium: 7.8 mg/dL — ABNORMAL LOW (ref 8.9–10.3)
Chloride: 107 mmol/L (ref 98–111)
Creatinine, Ser: 0.52 mg/dL — ABNORMAL LOW (ref 0.61–1.24)
GFR, Estimated: >60 mL/min (ref >60)
Glucose, Bld: 144 mg/dL — ABNORMAL HIGH (ref 70–99)
Potassium: 3.7 mmol/L (ref 3.5–5.1)
Sodium: 136 mmol/L (ref 135–145)
Total Bilirubin: 1 mg/dL (ref 0.3–1.2)
Total Protein: 5.5 g/dL — ABNORMAL LOW (ref 6.5–8.1)

## 2021-10-16 LAB — GLUCOSE, CAPILLARY
Glucose-Capillary: 103 mg/dL — ABNORMAL HIGH (ref 70–99)
Glucose-Capillary: 114 mg/dL — ABNORMAL HIGH (ref 70–99)
Glucose-Capillary: 117 mg/dL — ABNORMAL HIGH (ref 70–99)
Glucose-Capillary: 118 mg/dL — ABNORMAL HIGH (ref 70–99)
Glucose-Capillary: 125 mg/dL — ABNORMAL HIGH (ref 70–99)
Glucose-Capillary: 96 mg/dL (ref 70–99)

## 2021-10-16 MED ORDER — PROSOURCE PLUS PO LIQD
30.0000 mL | Freq: Three times a day (TID) | ORAL | Status: DC
  Administered 2021-10-16 – 2021-10-17 (×5): 30 mL via ORAL
  Filled 2021-10-16 (×6): qty 30

## 2021-10-16 MED ORDER — BOOST / RESOURCE BREEZE PO LIQD CUSTOM
1.0000 | Freq: Three times a day (TID) | ORAL | Status: DC
  Administered 2021-10-16 – 2021-10-17 (×5): 1 via ORAL

## 2021-10-16 MED ORDER — AMOXICILLIN-POT CLAVULANATE 875-125 MG PO TABS
1.0000 | ORAL_TABLET | Freq: Two times a day (BID) | ORAL | Status: DC
Start: 2021-10-17 — End: 2021-10-17
  Administered 2021-10-17: 1 via ORAL
  Filled 2021-10-16: qty 1

## 2021-10-16 NOTE — Progress Notes (Signed)
Nutrition Follow-up  DOCUMENTATION CODES:   Not applicable  INTERVENTION:   -D/c Ensure Enlive po TID, each supplement provides 350 kcal and 20 grams of protein -Continue MVI with minerals daily -Boost Breeze po TID, each supplement provides 250 kcal and 9 grams of protein  -30 ml Prosource Plus TID, each supplement provides 100 kcals and 15 grams protein -Double protein portions with meals  NUTRITION DIAGNOSIS:   Inadequate oral intake related to inability to eat (pt sedated and ventilated) as evidenced by mild fat depletion, moderate fat depletion, moderate muscle depletion.  Progressing; advanced to PO diet on 10/09/21  GOAL:   Patient will meet greater than or equal to 90% of their needs  Progressing   MONITOR:   PO intake, Supplement acceptance, Labs, Weight trends, Skin, I & O's  REASON FOR ASSESSMENT:   Ventilator    ASSESSMENT:   66 y/o male with h/o hypothyroidism, HTN, GERD, BPH and umbilical hernia repair (1961) who is admitted with new metastatic disease to bone with epidural cord compression suspected from prostate cancer now s/p C6 laminectomy for resection of epidural mass 8/13.  Reviewed I/O's: -1.3 L x 24 hours and -1.6 L since admission  UOP: 3 L x 24 hours   Spoke with pt and family member at bedside. His appetite is improving; noted meal completions 25-100%. He consumed all of his breakfast.   Per pt, he does not like the Ensure, as he believes it worsens his mucous production. RD reviewed other supplement options on formulary and he would like to try Boost Breeze. RD also obtained food preferences and updated meal ordering system. Per pt, he enjoys crackers crushed in applesauce; case discussed with [name], who agreed this is permissible.   Discussed importance of good meal and supplement intake to promote healing.   Medications reviewed and include vitamin C, colace, miralax, smog enema, and dextrose 5%-0.9% sodium chloride infusion @ 30 ml/hr.    Labs reviewed: CBGS: 96-125 (inpatient orders for glycemic control are 0-9 units insulin aspart every 4 hours).    Diet Order:   Diet Order             DIET DYS 3 Room service appropriate? Yes with Assist; Fluid consistency: Thin  Diet effective now                   EDUCATION NEEDS:   No education needs have been identified at this time  Skin:  Skin Assessment: Reviewed RN Assessment (cervical incision)  Last BM:  8/13  Height:   Ht Readings from Last 1 Encounters:  10/15/21 '5\' 9"'$  (1.753 m)    Weight:   Wt Readings from Last 1 Encounters:  10/16/21 103.6 kg    Ideal Body Weight:  72.7 kg  BMI:  Body mass index is 33.73 kg/m.  Estimated Nutritional Needs:   Kcal:  2000-2300kcal/day  Protein:  100-115g/day  Fluid:  2.2-2.5L/day    Loistine Chance, RD, LDN, [place] Registered Dietitian II Certified Diabetes Care and Education Specialist Please refer to [facility] for RD and/or RD on-call/weekend/after hours pager

## 2021-10-16 NOTE — Plan of Care (Signed)

## 2021-10-16 NOTE — Progress Notes (Signed)
Physical Therapy Treatment Patient Details Name: Jason [name] MRN: [id number] DOB: 29-Mar-1955 Today's Date: 10/16/2021   History of Present Illness Patient is a 66 year old male with Metastatic disease involving retroperitoneal and pelvic lymphadenopathy, extensive spine and calcarial metastasis/epidural tumor cord compression disease.  Status post C6 laminectomy for resection of C6 epidural mass. Surgical pathology showed metastatic adenocarcinoma, compatible with prostatic primary. MRI of the brain shows calvarial metastatic disease most notably in the right temporal region and at the vertex, Additional extracranial extension of tumor into the scalp along the vertex, small focus of diffusion restriction in the right parietal cortex, possible acute infarct.    PT Comments    Patient is agreeable to PT and motivated although he is fatigued. Patient continues to require +2 person assistance for bed mobility, including multiple rolling bouts for peri-care following a bowel movement. The patient required at least moderate assistance to maintain upright midline sitting balance. He was able to complete functional tasks today using RUE while seated, including using flutter valve, washing his face, and blowing his nose. The patient has very supportive family and friend at the bedside who encourage him with mobility. Recommend to continue PT to maximize independence and decrease caregiver burden. CIR recommended at discharge.    Recommendations for follow up therapy are one component of a multi-disciplinary discharge planning process, led by the attending physician.  Recommendations may be updated based on patient status, additional functional criteria and insurance authorization.  Follow Up Recommendations  Acute inpatient rehab (3hours/day)     Assistance Recommended at Discharge Frequent or constant Supervision/Assistance  Patient can return home with the following Two people to help with walking  and/or transfers;A lot of help with bathing/dressing/bathroom;Assistance with feeding;Direct supervision/assist for medications management;Direct supervision/assist for financial management;Assist for transportation;Help with stairs or ramp for entrance;Assistance with cooking/housework   Equipment Recommendations  Hospital bed;Wheelchair (measurements PT);Wheelchair cushion (measurements PT);BSC/3in1 (custom tilt in space, high back wheelechair\)    Recommendations for Other Services       Precautions / Restrictions Precautions Precautions: Fall;Cervical Restrictions Weight Bearing Restrictions: No     Mobility  Bed Mobility Overal bed mobility: Needs Assistance Bed Mobility: Supine to Sit, Sit to Supine, Rolling Rolling: Total assist, +2 for physical assistance   Supine to sit: Total assist, +2 for physical assistance Sit to supine: Total assist, +2 for physical assistance   General bed mobility comments: verbal cues for technique to reach across midline to facilitate rolling and for right elbow extension to promote upright trunk position. patient continues to require +2 person assistance for mobility efforts. no significant pain reported with activity.    Transfers                        Ambulation/Gait                   Stairs             Wheelchair Mobility    Modified Rankin (Stroke Patients Only)       Balance Overall balance assessment: Needs assistance Sitting-balance support: Feet supported, Bilateral upper extremity supported Sitting balance-Leahy Scale: Poor Sitting balance - Comments: minimal protective righting reactions noted, although patient is aware when he is not in midline. significant weakness throughout and external support required to maintain mildine sitting balance, at least moderate assistance. emphasis on trunk activation, keeping head, neck, and trunk in midline. also had patient practice deep breathing techniques and  coughing, including  using flutter valve while seated. productive cough was noted after return to bed Postural control:  (loss of balance in all directions, primarily posteriorly)                                  Cognition Arousal/Alertness: Awake/alert Behavior During Therapy: WFL for tasks assessed/performed Overall Cognitive Status: Within Functional Limits for tasks assessed                                 General Comments: patient is motivated and able to follow single step commands with increased time        Exercises      General Comments General comments (skin integrity, edema, etc.): patient was incontinent of bowel with sitting activity. rolling performed multiple times and total assistance for peri-hygiene      Pertinent Vitals/Pain Pain Assessment Pain Assessment: Faces Faces Pain Scale: Hurts a little bit Pain Location: L shoulder Pain Descriptors / Indicators: Discomfort Pain Intervention(s): Limited activity within patient's tolerance, Monitored during session, Repositioned    Home Living                          Prior Function            PT Goals (current goals can now be found in the care plan section) Acute Rehab PT Goals Patient Stated Goal: to get to rehab in [place] PT Goal Formulation: With patient Time For Goal Achievement: 10/24/21 Potential to Achieve Goals: Fair Progress towards PT goals: Progressing toward goals    Frequency    Min 3X/week      PT Plan Current plan remains appropriate    Co-evaluation PT/OT/[name] Co-Evaluation/Treatment: Yes Reason for Co-Treatment: Complexity of the patient's impairments (multi-system involvement);For patient/therapist safety;To address functional/ADL transfers PT goals addressed during session: Mobility/safety with mobility;Balance        AM-PAC PT "6 Clicks" Mobility   Outcome Measure  Help needed turning from your back to your side while in a flat bed  without using bedrails?: Total Help needed moving from lying on your back to sitting on the side of a flat bed without using bedrails?: Total Help needed moving to and from a bed to a chair (including a wheelchair)?: Total Help needed standing up from a chair using your arms (e.g., wheelchair or bedside chair)?: Total Help needed to walk in hospital room?: Total Help needed climbing 3-5 steps with a railing? : Total 6 Click Score: 6    End of Session Equipment Utilized During Treatment: Oxygen Activity Tolerance: Patient tolerated treatment well Patient left: in bed;with SCD's reapplied (prevlon boots in palce) Nurse Communication: Mobility status PT Visit Diagnosis: Muscle weakness (generalized) (M62.81);Other abnormalities of gait and mobility (R26.89);Other symptoms and signs involving the nervous system (R29.898)     Time: 1660-6301 PT Time Calculation (min) (ACUTE ONLY): 49 min  Charges:  $Therapeutic Activity: 23-37 mins                     Minna Merritts, PT, MPT    Percell Locus 10/16/2021, 3:39 PM

## 2021-10-16 NOTE — Progress Notes (Signed)
Jason [name] at [place] NAME: Jason [name]    MR#:  [id number]  DATE OF BIRTH:  07-19-1955  SUBJECTIVE:  Wife at bedside  No new complaints No cp or sob Working with PT and OT currently on 2L  nasal cannula oxygen. Sats remaining more than 92% wean in progress. Good BM's Sugars stable. Pt encouraged to eat small freq meals to avoid drop in sugar   VITALS:  Blood pressure 136/67, pulse 84, temperature 98.7 F (37.1 C), resp. rate 20, height '5\' 9"'$  (1.753 m), weight 103.6 kg, SpO2 94 %.  PHYSICAL EXAMINATION:   GENERAL:  66 y.o.-year-old patient lying in the bed with no acute distress.  LUNGS: Normal breath sounds bilaterally, no wheezing, rales, rhonchi.  CARDIOVASCULAR: S1, S2 normal. No murmurs, rubs, or gallops.  ABDOMEN: Soft, nontender, nondistended. Bowel sounds present. Foley+ EXTREMITIES:bilateral wrist and leg splints+ NEUROLOGIC: nonfocal  patient is alert and awake. Weak bilateral UE and LE  LABORATORY PANEL:  CBC Recent Labs  Lab 10/14/21 0452 10/15/21 0429  WBC 12.1*  --   HGB 7.6* 7.7*  HCT 23.5*  --   PLT 200  --      Chemistries  Recent Labs  Lab 10/14/21 0452  NA 139  K 4.2  CL 114*  CO2 21*  GLUCOSE 102*  BUN 20  CREATININE 0.55*  CALCIUM 8.2*  MG 2.4  AST 102*  ALT 125*  ALKPHOS 165*  BILITOT 0.9    Cardiac Enzymes No results for input(s): "TROPONINI" in the last 168 hours. RADIOLOGY:  No results found.  Assessment and Plan  66 year old WM PMHx essential HTN, unstable angina, PVC, PSVT, NSVT, hypothyroidism, GERD, BPH, prostate cancer metastatic to multiple sites,    Presented with neck/shoulder pain found to have widely metastatic cancer suspicious for prostate He was found to have widely metastatic prostate cancer including calvarial mets and epidural spinal tumor, now s/p emergent C6 cervical laminectomy for resection of the epidural tumor. Surgery was on 8/13. Palliative radiation  therapy is planned to cervical spine. 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 worsening shock despite fluids and post op resp failure now extubation, high risk for aspiration  Extensive metastasis epidural space, spine, calvarium, pelvis- with significant neurological deficits,  -8/13 s/p cervical tumor debulking and canal decompression -Per PCCM note patient to follow with oncology and radiation oncology. - when more stable officially consult radiation oncology --Per [name] pt is s/p [place]--next dose 9/13 --check LFT's  Severe ACUTE Hypoxic and Hypercapnic Respiratory Failure -Extubated -High risk for aspiration and re-intubation -Chest PT okay by neuro -Incentive spirometry - Flutter valve - DuoNeb qid prn --weaned to  2 L [place]. Sats stable  - Sepsis pneumonia/Aspiration pneumonia -8/19 PCXR increase bibasilar atelectasis possible RLL aspiration -Cont IV unasyn--will change to oral meds in am --wbc 12K --low grade fever  --speech evaluation appreciated. No overt signs of aspiration noted. Continue aspiration precaution.  Hypotension--improved --cont midodrine  Shock -Most likely secondary to extensive metastatic disease -Vasopressors discontinued - Resolved   RIGHT parietal stroke - Neurology consulted -Posterior cervical incision management and  MAP goal> 65 per NSGY -Wean off dexamethasone to promote wound healing. --recommends ASA to be started (hgb 7.6)   Anemia unspecified/IDA -8/20 discussed case with Dr. Su Monks neurology given patient's anemia  will hold off on starting aspirin until at least 8/21 to ensure hemoglobin stable --8/21--iron studies  abnormal. Will d/w pt and wife regarding IV iron --no active bleeding reported -  transfuse for hemoglobin<7 --d/w pt and wife ok to take IV iron (ferraheme) and po iron  Anxiety - Clonazepam 0.5 mg BID - Prozac 20 mg daily  Overall slowly moving well. Will d/w NS and oncology regarding d/c planning to CIR   Procedures:as above Family communication :wife at bedside Consults : neurosurgery, neurology, PCCM CODE STATUS:  FULL DVT Prophylaxis :enoxaparin Level of care: Telemetry Medical Status is: Inpatient Remains inpatient appropriate because: Patient not medically stable to DC. Multiple medical issues. Anticipate to CIR in 1-3 days    TOTAL TIME TAKING CARE OF THIS PATIENT: 35 minutes.  >50% time spent on counselling and coordination of care  Note: This dictation was prepared with Dragon dictation along with smaller phrase technology. Any transcriptional errors that result from this process are unintentional.  Jason [name] M.D    [facility]   CC: Primary care physician; Jason Carbon, MD

## 2021-10-16 NOTE — Progress Notes (Signed)
Report called to receiving RN Merleen Nicely. Patient transferred to room 150 via bed with all personal belongings with him. Receiving RN, Merleen Nicely verbalized that she would call the patients wife in the morning, patient is in agreement with that also.

## 2021-10-16 NOTE — Progress Notes (Signed)
Occupational Therapy Treatment Patient Details Name: Jason [name] MRN: [id number] DOB: 1955/08/03 Today's Date: 10/16/2021   History of present illness Patient is a 66 year old male with Metastatic disease involving retroperitoneal and pelvic lymphadenopathy, extensive spine and calcarial metastasis/epidural tumor cord compression disease.  Status post C6 laminectomy for resection of C6 epidural mass. Surgical pathology showed metastatic adenocarcinoma, compatible with prostatic primary. MRI of the brain shows calvarial metastatic disease most notably in the right temporal region and at the vertex, Additional extracranial extension of tumor into the scalp along the vertex, small focus of diffusion restriction in the right parietal cortex, possible acute infarct.   OT comments  Jason [name] was seen for OT/PT co-treatment on this date. Upon arrival to room pt reclined in bed, reports fatigue but agreeable to session. Pt requires TOTAL A x2 pericare bed level. TOTAL A x2 bed mobility, tolerates static sitting for grooming tasks. Requires SETUP + MAX A face washing and nose blowing in sitting, assist for sitting balance. Pt making good progress toward goals, will continue to follow POC. Discharge recommendation remains appropriate.     Recommendations for follow up therapy are one component of a multi-disciplinary discharge planning process, led by the attending physician.  Recommendations may be updated based on patient status, additional functional criteria and insurance authorization.    Follow Up Recommendations  Acute inpatient rehab (3hours/day)    Assistance Recommended at Discharge Frequent or constant Supervision/Assistance  Patient can return home with the following  Two people to help with bathing/dressing/bathroom;Two people to help with walking and/or transfers   Equipment Recommendations  BSC/3in1;Tub/shower seat;Wheelchair (measurements OT);Wheelchair cushion (measurements  OT);Hospital bed    Recommendations for Other Services      Precautions / Restrictions Precautions Precautions: Fall;Cervical Restrictions Weight Bearing Restrictions: No       Mobility Bed Mobility Overal bed mobility: Needs Assistance Bed Mobility: Supine to Sit, Sit to Supine, Rolling Rolling: Total assist, +2 for physical assistance   Supine to sit: Total assist, +2 for physical assistance Sit to supine: Total assist, +2 for physical assistance   General bed mobility comments: verbal cues for technique to reach across midline to facilitate rolling and for right elbow extension to promote upright trunk position. patient continues to require +2 person assistance for mobility efforts. no significant pain reported with activity.    Transfers                         Balance Overall balance assessment: Needs assistance Sitting-balance support: Feet supported, Bilateral upper extremity supported Sitting balance-Leahy Scale: Poor Postural control:  (loss of balance in all directions, primarily posteriorly)                                 ADL either performed or assessed with clinical judgement   ADL Overall ADL's : Needs assistance/impaired                                       General ADL Comments: TOTAL A x2 pericare bed level. SETUP + MAX A face washing and nose blowing in sitting, assist for sitting balance.      Cognition Arousal/Alertness: Awake/alert Behavior During Therapy: WFL for tasks assessed/performed Overall Cognitive Status: Within Functional Limits for tasks assessed  General Comments: good insight into deficits, follows commands              General Comments patient was incontinent of bowel with sitting activity. rolling performed multiple times and total assistance for peri-hygiene    Pertinent Vitals/ Pain       Pain Assessment Pain Assessment: Faces Faces  Pain Scale: Hurts a little bit Pain Location: L shoulder Pain Descriptors / Indicators: Discomfort Pain Intervention(s): Limited activity within patient's tolerance   Frequency  Min 3X/week        Progress Toward Goals  OT Goals(current goals can now be found in the care plan section)  Progress towards OT goals: Progressing toward goals  Acute Rehab OT Goals Patient Stated Goal: to go home OT Goal Formulation: With patient/family Time For Goal Achievement: 10/24/21 Potential to Achieve Goals: Good ADL Goals Pt Will Perform Grooming: with mod assist Pt Will Perform Upper Body Dressing: with mod assist Pt Will Transfer to Toilet: with max assist;bedside commode Pt Will Perform Toileting - Clothing Manipulation and hygiene: with mod assist;sitting/lateral leans  Plan Discharge plan remains appropriate;Frequency remains appropriate    Co-evaluation    PT/OT/[name] Co-Evaluation/Treatment: Yes Reason for Co-Treatment: For patient/therapist safety;To address functional/ADL transfers;Complexity of the patient's impairments (multi-system involvement) PT goals addressed during session: Mobility/safety with mobility OT goals addressed during session: ADL's and self-care      AM-PAC OT "6 Clicks" Daily Activity     Outcome Measure   Help from another person eating meals?: A Lot Help from another person taking care of personal grooming?: A Lot Help from another person toileting, which includes using toliet, bedpan, or urinal?: Total Help from another person bathing (including washing, rinsing, drying)?: Total Help from another person to put on and taking off regular upper body clothing?: A Lot Help from another person to put on and taking off regular lower body clothing?: Total 6 Click Score: 9    End of Session    OT Visit Diagnosis: Unsteadiness on feet (R26.81);Muscle weakness (generalized) (M62.81);Other abnormalities of gait and mobility (R26.89);Other symptoms and signs  involving the nervous system (R29.898)   Activity Tolerance Patient tolerated treatment well   Patient Left in bed;with bed alarm set;with call bell/phone within reach;with family/visitor present   Nurse Communication          Time: 8250-5397 OT Time Calculation (min): 49 min  Charges: OT General Charges $OT Visit: 1 Visit OT Treatments $Self Care/Home Management : 8-22 mins  Dessie Coma, M.S. OTR/L  10/16/21, 4:00 PM  ascom [phone]

## 2021-10-16 NOTE — Progress Notes (Signed)
Inpatient Rehabilitation Admissions Coordinator   I have begun insurance approval today with [employer] of [place] for possible Cir admit. Discussion with acute team, to include Dr Posey Pronto and oncology to request radiation be arranged at [place] [facility] , not [facility] to best be able to manage transportation to and from [place] at [facility] in [place].  Danne Baxter, RN, MSN Rehab Admissions Coordinator [phone] 10/16/2021 3:53 PM

## 2021-10-16 NOTE — PMR Pre-admission (Signed)
PMR Admission Coordinator Pre-Admission Assessment  Patient: Jason [name] is an 65 y.o., male MRN: [id number] DOB: [birth year] Height: '5\' 9"'  (175.3 cm) Weight: 99.8 kg  Insurance Information HMO:     PPO: yes     PCP:      IPA:      80/20:      OTHER:  PRIMARY: [employer] [place]      Policy#: [id number]      Subscriber: pt CM Name: Via Fax from Health Management      Phone#: via fax [phone] option #7     Fax#: [phone] Pre-Cert#: [id number] approved 8/24 until 8/30. Updates due 8/31      Employer: [employer] Benefits:  Phone #: [phone] option 2     Name: 8/23 Eff. Date: 07/17/2005     Deduct: $1000 ( met)      Out of Pocket Max: $6000 ( met)      Life Max: none CIR: 70%      SNF: 70% limit 120 days per year Outpatient: 70%     Co-Pay: 25 combined visits Home Health: 100%      Co-Pay: 120 visits DME: 70%     Co-Pay: 30% Providers: in network  SECONDARY: [employer] Policy # [id number]        Financial Counselor:       Phone#:   The "Data Collection Information Summary" for patients in Inpatient Rehabilitation Facilities with attached "Privacy Act [place] Records" was provided and verbally reviewed with: N/A  Emergency Contact Information Contact Information     Name Relation Home Work [place] [phone]      Current Medical History  Patient Admitting Diagnosis: Metastatic prostate Cancer  History of Present Illness: 66 year old male with history of essential HTN, hypothyroidism, GERD and osteoarthritis. Presented to [facility] ER on 10/04/21 with continued chronic neck pain since last December and associated brief symptoms left hand thumb and index paresthesias with tingling and numbness without weakness. Recent MRI on 8/10 that revealed multifocal abnormal marrow signal with relatively diffuse involvement of C4 into the thoracic spine with associated epidural disease, greatest at C6 where there is marked canal  stenosis without cord compression.  There was left greater than right foraminal effacement due to extraosseous disease.  It was thought that this may reflect metastatic disease or marrow infiltrative neoplastic process.   CT scan revealed moderate severity retroperitoneal and pelvic lymphadenopathy consistent with metastatic disease, Small lytic lesions at the levels of L4 and S1. With diffusely sclerotic changes involving the second an fourth right ribs, sacrum and left iliac bone. Findings consistent with osseous metastasis. Neurosurgery consulted and had emergent C6 cervical laminectomy for resection of epidural tumor on 8/13. Developed worsening shock postoperatively and respiratory failure requiring intubation. Now extubates to HFNC and now O2 3 liters [place]. Felt high risk for aspiration and re intubation. 8/19 PCXR increase bibasilar atelectasis possible RLL aspiration. On Unasyn. [name] follow up.   Neurology consulted on 10/08/21 MRI on 8/15 imaging revealed small acute ischemic right parietal infarct.  Oncology consulted s/p Firmagon x 1 with next dose due 9/13. Radiation Oncology consulted for possible palliative radiation to cervical spine. Possible delay until after Labor day. Plan is for 30 Gray in 10 fractions.   Patient's medical record from [facility] has been reviewed by the rehabilitation admission coordinator and physician.  Past Medical History  Past Medical History:  Diagnosis Date   Allergy    Arthritis  osteoarthritis   GERD (gastroesophageal reflux disease)    Hypertension    Hypothyroidism    Thyroid disease    Hyperthyroidism s/p radioactive iodine ablation   Has the patient had major surgery during 100 days prior to admission? Yes  Family History   family history includes Gout in his father; Heart attack (age of onset: 71) in his father; Heart disease in his sister; Hypertension in his mother; Stroke in his mother.  Current Medications  Current Facility-Administered  Medications:    (feeding supplement) PROSource Plus liquid 30 mL, 30 mL, Oral, TID BM, Fritzi Mandes, MD, 30 mL at 10/17/21 0911   0.9 %  sodium chloride infusion, 250 mL, Intravenous, Continuous, Candee Furbish, MD, Paused at 10/15/21 1451   acetaminophen (TYLENOL) tablet 650 mg, 650 mg, Oral, Q6H PRN, 650 mg at 10/16/21 0543 **OR** acetaminophen (TYLENOL) suppository 650 mg, 650 mg, Rectal, Q6H PRN, Meade Maw, MD   amoxicillin-clavulanate (AUGMENTIN) 875-125 MG per tablet 1 tablet, 1 tablet, Oral, Q12H, Fritzi Mandes, MD, 1 tablet at 10/17/21 6004   ascorbic acid (VITAMIN C) tablet 500 mg, 500 mg, Oral, Daily, Meade Maw, MD, 500 mg at 10/17/21 5997   aspirin EC tablet 81 mg, 81 mg, Oral, Daily, Fritzi Mandes, MD, 81 mg at 10/17/21 0909   atorvastatin (LIPITOR) tablet 40 mg, 40 mg, Oral, Daily, Derek Jack, MD, 40 mg at 10/17/21 0909   budesonide (PULMICORT) nebulizer solution 0.5 mg, 0.5 mg, Nebulization, BID, Kasa, Kurian, MD, 0.5 mg at 10/17/21 0747   Chlorhexidine Gluconate Cloth 2 % PADS 6 each, 6 each, Topical, Daily, Allie Bossier, MD, 6 each at 10/17/21 0911   clonazePAM (KLONOPIN) tablet 0.5 mg, 0.5 mg, Oral, BID, Candee Furbish, MD, 0.5 mg at 10/17/21 7414   dextromethorphan-guaiFENesin (Hayden DM) 30-600 MG per 12 hr tablet 1 tablet, 1 tablet, Oral, BID, Meade Maw, MD, 1 tablet at 10/17/21 0909   docusate sodium (COLACE) capsule 100 mg, 100 mg, Oral, BID, Benita Gutter, RPH, 100 mg at 10/17/21 0910   enoxaparin (LOVENOX) injection 50 mg, 0.5 mg/kg, Subcutaneous, Q24H, Dallie Piles, RPH, 50 mg at 10/16/21 2142   famotidine (PEPCID) tablet 20 mg, 20 mg, Oral, Daily, Benita Gutter, RPH, 20 mg at 10/17/21 0910   feeding supplement (BOOST / RESOURCE BREEZE) liquid 1 Container, 1 Container, Oral, TID BM, Fritzi Mandes, MD, 1 Container at 10/17/21 0911   FLUoxetine (PROZAC) capsule 20 mg, 20 mg, Oral, QHS, Candee Furbish, MD, 20 mg at 10/16/21 2201    hydrALAZINE (APRESOLINE) injection 5 mg, 5 mg, Intravenous, Q6H PRN, Emeterio Reeve, DO   insulin aspart (novoLOG) injection 0-9 Units, 0-9 Units, Subcutaneous, Q4H, Rust-Chester, Britton L, NP, 1 Units at 10/15/21 1140   ipratropium-albuterol (DUONEB) 0.5-2.5 (3) MG/3ML nebulizer solution 3 mL, 3 mL, Nebulization, TID, Allie Bossier, MD, 3 mL at 10/17/21 0746   iron polysaccharides (NIFEREX) capsule 150 mg, 150 mg, Oral, Daily, Fritzi Mandes, MD, 150 mg at 10/17/21 2395   levothyroxine (SYNTHROID) tablet 88 mcg, 88 mcg, Oral, Q0600, Meade Maw, MD, 88 mcg at 10/17/21 0510   lip balm (BLISTEX) ointment 1 Application, 1 Application, Topical, PRN, Belue, Alver Sorrow, RPH   loratadine (CLARITIN) tablet 10 mg, 10 mg, Oral, BID, Meade Maw, MD, 10 mg at 10/17/21 3202   magnesium hydroxide (MILK OF MAGNESIA) suspension 30 mL, 30 mL, Oral, Daily PRN, Meade Maw, MD, 30 mL at 10/05/21 0744   melatonin tablet 5 mg, 5 mg,  Oral, QHS PRN, Meade Maw, MD, 5 mg at 10/09/21 2135   meloxicam (MOBIC) tablet 15 mg, 15 mg, Oral, Daily, Meade Maw, MD, 15 mg at 10/16/21 2140   midodrine (PROAMATINE) tablet 5 mg, 5 mg, Oral, TID WC, Allie Bossier, MD, 5 mg at 10/17/21 7062   multivitamin with minerals tablet 1 tablet, 1 tablet, Oral, Daily, Meade Maw, MD, 1 tablet at 10/17/21 0909   ondansetron (ZOFRAN) tablet 4 mg, 4 mg, Oral, Q6H PRN **OR** ondansetron (ZOFRAN) injection 4 mg, 4 mg, Intravenous, Q6H PRN, Meade Maw, MD   Oral care mouth rinse, 15 mL, Mouth Rinse, PRN, Fritzi Mandes, MD   oxyCODONE-acetaminophen (PERCOCET/ROXICET) 5-325 MG per tablet 1-2 tablet, 1-2 tablet, Oral, Q6H PRN, Candee Furbish, MD   polyethylene glycol (MIRALAX / GLYCOLAX) packet 17 g, 17 g, Oral, Daily, Rust-Chester, Britton L, NP, 17 g at 10/15/21 1038   potassium chloride (KLOR-CON M) CR tablet 10 mEq, 10 mEq, Oral, QHS, Meade Maw, MD, 10 mEq at 10/16/21 2139   sodium  chloride (OCEAN) 0.65 % nasal spray 1 spray, 1 spray, Each Nare, PRN, Candee Furbish, MD, 1 spray at 10/09/21 2206   sodium chloride flush (NS) 0.9 % injection 10-40 mL, 10-40 mL, Intracatheter, Q12H, Allie Bossier, MD, 20 mL at 10/16/21 2143   sodium chloride flush (NS) 0.9 % injection 10-40 mL, 10-40 mL, Intracatheter, PRN, Allie Bossier, MD   sorbitol, milk of mag, mineral oil, glycerin (SMOG) enema, 400 mL, Rectal, Once, Allie Bossier, MD   tamsulosin [facility]) capsule 0.4 mg, 0.4 mg, Oral, Daily, Meade Maw, MD, 0.4 mg at 10/17/21 3762  Patients Current Diet:  Diet Order             DIET DYS 3 Room service appropriate? Yes with Assist; Fluid consistency: Thin  Diet effective now                  Precautions / Restrictions Precautions Precautions: Fall, Cervical Precaution Comments: C6 laminectomy, MAP >65 Restrictions Weight Bearing Restrictions: No   Has the patient had 2 or more falls or a fall with injury in the past year? No  Prior Activity Level Community (5-7x/wk): independent, working and driving  Prior Functional Level Self Care: Did the patient need help bathing, dressing, using the toilet or eating? Independent  Indoor Mobility: Did the patient need assistance with walking from room to room (with or without device)? Independent  Stairs: Did the patient need assistance with internal or external stairs (with or without device)? Independent  Functional Cognition: Did the patient need help planning regular tasks such as shopping or remembering to take medications? Independent  Patient Information Are you of Hispanic, Latino/a,or Spanish origin?: A. No, not of Hispanic, Latino/a, or Spanish origin What is your race?: A. White Do you need or want an interpreter to communicate with a doctor or health care staff?: 0. No  Patient's Response To:  Health Literacy and Transportation Is the patient able to respond to health literacy and transportation  needs?: Yes Health Literacy - How often do you need to have someone help you when you read instructions, pamphlets, or other written material from your doctor or pharmacy?: Never In the past 12 months, has lack of transportation kept you from medical appointments or from getting medications?: No In the past 12 months, has lack of transportation kept you from meetings, work, or from getting things needed for daily living?: No  Development worker, international aid / [place]  Assistive Devices/Equipment: None Home Equipment: Conservation officer, nature (2 wheels), [employer] - single point, Other (comment), Grab bars - tub/shower  Prior Device Use: Indicate devices/aids used by the patient prior to current illness, exacerbation or injury? None of the above  Current Functional Level Cognition  Overall Cognitive Status: Within Functional Limits for tasks assessed Orientation Level: Oriented X4 General Comments: good insight into deficits, follows commands    Extremity Assessment (includes Sensation/Coordination)  Upper Extremity Assessment: Generalized weakness, RUE deficits/detail, LUE deficits/detail RUE Deficits / Details: R hand dominant; Mild right tenodesis, AROM; shoulder flexion 90 degrees, elbow WFL, wrist limited by aline; Strength 3/5 in elbow; Weak grip strength with pt unable to make a fist; proprioception intact, sensation intact; edema noted throughout B hands RUE Coordination: decreased fine motor, decreased gross motor LUE Deficits / Details: Baseline pt with LUE deficits following shouler surgery earlier this year; AROM:shoulder flexion: 1/4 full AROM, elbow 3/4 full AROM, wrist with L tenodesis proprioception intact, sensation intact; pt unable to  make a fist, weak grip strength LUE Coordination: decreased fine motor, decreased gross motor  Lower Extremity Assessment: Defer to PT evaluation RLE Deficits / Details: 0/5 throughout. 3 beats of clonus noted with quick dorsiflexion stretch.  hypertonicity. RLE Sensation: decreased light touch (great toe flexion/extension proprioception intact) RLE Coordination: decreased gross motor, decreased fine motor LLE Deficits / Details: trace hamstring activation palpated, trace dorsiflexion, trace hip adduction. otherwise 0/5. absent light touch and deep pressure upper/lateral thigh. 3 beats of clonus noted with quick dorsiflexion strecth. hypertonicity LLE Sensation: decreased light touch (great toe flexion/extension proprioception intact) LLE Coordination: decreased gross motor, decreased fine motor    ADLs  Overall ADL's : Needs assistance/impaired Eating/Feeding: Maximal assistance, Bed level Grooming: Wash/dry face, Maximal assistance, Bed level Upper Body Dressing : Maximal assistance Upper Body Dressing Details (indicate cue type and reason): anticipated Lower Body Dressing: Total assistance Lower Body Dressing Details (indicate cue type and reason): at edge of bed Toileting- Clothing Manipulation and Hygiene: Maximal assistance, Total assistance Toileting - Clothing Manipulation Details (indicate cue type and reason): pt with foley at this time, MAX-TOTAL A for peri care Functional mobility during ADLs: Maximal assistance, Total assistance, +2 for physical assistance, +2 for safety/equipment General ADL Comments: TOTAL A x2 pericare bed level. SETUP + MAX A face washing and nose blowing in sitting, assist for sitting balance.    Mobility  Overal bed mobility: Needs Assistance Bed Mobility: Supine to Sit, Sit to Supine, Rolling Rolling: Total assist, +2 for physical assistance Supine to sit: Total assist, +2 for physical assistance Sit to supine: Total assist, +2 for physical assistance General bed mobility comments: verbal cues for technique to reach across midline to facilitate rolling and for right elbow extension to promote upright trunk position. patient continues to require +2 person assistance for mobility efforts. no  significant pain reported with activity.    Transfers  General transfer comment: unsafe to attempt due to severity of weakness. would need a lift for OOB activity at this time    Ambulation / Gait / Stairs / Office manager / Balance Dynamic Sitting Balance Sitting balance - Comments: minimal protective righting reactions noted, although patient is aware when he is not in midline. significant weakness throughout and external support required to maintain mildine sitting balance, at least moderate assistance. emphasis on trunk activation, keeping head, neck, and trunk in midline. also had patient practice deep breathing techniques and coughing, including using flutter valve while  seated. productive cough was noted after return to bed Balance Overall balance assessment: Needs assistance Sitting-balance support: Feet supported, Bilateral upper extremity supported Sitting balance-Leahy Scale: Poor Sitting balance - Comments: minimal protective righting reactions noted, although patient is aware when he is not in midline. significant weakness throughout and external support required to maintain mildine sitting balance, at least moderate assistance. emphasis on trunk activation, keeping head, neck, and trunk in midline. also had patient practice deep breathing techniques and coughing, including using flutter valve while seated. productive cough was noted after return to bed Postural control:  (loss of balance in all directions, primarily posteriorly)    Special needs/care consideration Radiation simulation date TBD at [facility] and to follow up 10 radiation treatments after Labor day. Dates and times TBD ( Care link transport has not been arranged as dates and times are being switched form Cancer center at [facility] to [place])       Dr Philis Pique will consult once in [facility] with simulation likely 8/28 or 8/29. Latex 16 fr urethral catheter placed 10/12/21  Previous Home Environment  Living  Arrangements: Spouse/significant other  Lives With: Spouse Available Help at Discharge: Family, Available 24 hours/day (wife works remote for [employer]) Type of Home: [employer] Layout: One level Home Access: Stairs to enter Entrance Stairs-Rails: Horticulturist, commercial of Steps: 3 Bathroom Shower/Tub: Chiropodist: Standard Bathroom Accessibility: Yes How Accessible: Accessible via walker [place]: No  Discharge Living Setting Plans for Discharge Living Setting: Patient's home, Lives with (comment) (spouse) Type of Home at Discharge: House Discharge Home Layout: One level Discharge Home Access: Stairs to enter Entrance Stairs-Rails: Left Entrance Stairs-Number of Steps: 3 Discharge Bathroom Shower/Tub: Tub/shower unit Discharge Bathroom Toilet: Standard Discharge Bathroom Accessibility: Yes How Accessible: Accessible via walker Does the patient have any problems obtaining your medications?: No  Social/Family/Support Systems Patient Roles: Spouse (employee) Contact Information: wife, Psychologist, sport and exercise Anticipated Caregiver: wife and friends (They are originally from [place] so all family is out of state) Anticipated Ambulance person Information: see contacts Ability/Limitations of Caregiver: wife works remote from home; petite stature Caregiver Availability: 24/7 Discharge Plan Discussed with Primary Caregiver: Yes Is Caregiver In Agreement with Plan?: Yes Does Caregiver/Family have Issues with Lodging/Transportation while Pt is in Rehab?: No  Wife's brother in law W/c level with lift after CVA a couple of years ago. He is very active and they feel they can manage patient at [place] level also.  Wife's close friend, Lilia Pro, very involved in assisting them with decisions moving forward. Bonnita Nasuti was former Chief Strategy Officer at [employer])  Goals Patient/Family Goal for Rehab: mod assist with PT and OT at wheelchair level, supervision with [name] Expected length of  stay: ELOS 3 to 4 weeks Pt/Family Agrees to Admission and willing to participate: Yes Program Orientation Provided & Reviewed with Pt/Caregiver Including Roles  & Responsibilities: Yes  Decrease burden of Care through IP rehab admission: n/a  Possible need for SNF placement upon discharge: if does not reach level to discharge home.  Patient Condition: I have reviewed medical records from [facility], spoken with CM, and patient and spouse. I met with patient at the bedside for inpatient rehabilitation assessment.  Patient will benefit from ongoing PT, OT, and [name], can actively participate in 3 hours of therapy a day 5 days of the week, and can make measurable gains during the admission.  Patient will also benefit from the coordinated team approach during an Inpatient Acute Rehabilitation admission.  The  patient will receive intensive therapy as well as Rehabilitation physician, nursing, social worker, and care management interventions.  Due to bladder management, bowel management, safety, skin/wound care, disease management, medication administration, pain management, and patient education the patient requires 24 hour a day rehabilitation nursing.  The patient is currently total assist at bed level with mobility and basic ADLs.  Discharge setting and therapy post discharge at home with home health is anticipated.  Patient has agreed to participate in the Acute Inpatient Rehabilitation Program and will admit today.  Preadmission Screen Completed By:  Cleatrice Burke, 10/17/2021 11:01 AM ______________________________________________________________________   Discussed status with Dr. Dagoberto Ligas on 10/17/21 at 82 and received approval for admission today.  Admission Coordinator:  Cleatrice Burke, RN, time 1100 Date 10/17/21   Assessment/Plan: Diagnosis: Does the need for close, 24 hr/day Medical supervision in concert with the patient's rehab needs make it unreasonable for this patient to be  served in a less intensive setting? 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 Yes Does the patient require coordinated care of a physician, rehab nurse, PT, OT, and [name] to address physical and functional deficits in the context of the above medical diagnosis(es)? Yes Addressing deficits in the following areas: balance, endurance, locomotion, strength, transferring, bowel/bladder control, bathing, dressing, feeding, grooming, toileting, cognition, speech, language, and swallowing Can the patient actively participate in an intensive therapy program of at least 3 hrs of therapy 5 days a week? Yes The potential for patient to make measurable gains while on inpatient rehab is good and fair Anticipated functional outcomes upon discharge from inpatient rehab: min assist and mod assist PT, min assist and mod assist OT, supervision [name] Estimated rehab length of stay to reach the above functional goals is: 3-4 weeks Anticipated discharge destination: Home 10. Overall Rehab/Functional Prognosis: good and fair   MD Signature:

## 2021-10-16 NOTE — Progress Notes (Signed)
History: Jason [name] is s/p C6 laminectomy for resection of epidural mass  POD10: transferred to floor overnight. No concerns this afternoon   POD9: NAEO  POD8: pt reports some improved breathing overnight  POD7:  Patient spiked fever.  Incision c/d/I.  Very low suspicion of SSI.  Likely aspiration pneumonia.   POD6:  No significant neurological changes. Patient subjectively feels hands are better.   POD5: no significant neurologic changes overnight, Continues to have anxiety.    POD4: continues to have weakness in his arms and legs.  He states he does have sensation in his legs however.  He also endorses significant anxiety regarding his recent metastatic cancer diagnosis   POD2: Pt was transferred to the ICU after yesterdays events. Pt remains intubated and sedated this morning. Wife at bedside.    POD1 PM: I was contacted to see [name] at approximately 1615.  I saw him in his room at approximately 1620, and was able to review the clinical situation with his wife.  From review with her, it seems that [name] was having some issues with his arms when she arrived at around 18, when he reported some difficulty feeding himself with lunch.  Per nursing, his nurses also noted that he was having some difficulty with movement of his arms and legs, but he improved with a fluid bolus around 1300.    When I saw him at 1620, he was arousable but not readily interactive with examination.  He moved his arms but was not reliable. He was noted to be significantly hypotensive.  ICU attending Dr. Tamala Julian, hospitalist Dr. Sheppard Coil, and I reviewed the case and all agreed transfer to ICU was appropriate.  This was done, and his blood pressure improved with another fluid bolus.   I then reevaluated him, noted below.   POD1 AM: Increased tingling in right hand this morning. Reports good pain control   Physical Exam:        Vitals:    10/11/21 0400 10/11/21 0826  BP: 124/76    Pulse: 89     Resp: (!) 24    Temp: 98.3 F (36.8 C)    SpO2: 93% 92%      AA Ox3  CNI 4/5 bilateral deltoids 3/5 left triceps and 2/5 left bicep 3/5 right triceps and bicep. 2/5 left HG 3/5 right HG. 1/5 left quad otherwise 0/5 bilateral legs Incision intact with staples in place and open to air.    Data:       Last Labs            Recent Labs  Lab 10/09/21 0429 10/10/21 0455 10/11/21 0500  NA 136 136 136  K 4.1 3.8 3.7  CL 108 107 106  CO2 '23 24 23  '$ BUN 29* 46* 32*  CREATININE 0.69 0.82 0.51*  GLUCOSE 130* 143* 93  CALCIUM 8.2* 8.2* 7.8*       Last Labs        Recent Labs  Lab 10/11/21 0500  AST 121*  ALT 134*  ALKPHOS 170*         Last Labs            Recent Labs  Lab 10/09/21 0429 10/10/21 0455 10/11/21 0500  WBC 16.4* 15.3* 13.9*  HGB 10.0* 8.8* 9.2*  HCT 30.4* 26.5* 28.2*  PLT 137* 175 173       Last Labs   No results for input(s): "APTT", "INR" in the last 168 hours.  Assessment/Plan:   Jason [name] is a 66 y.o s/p C6 laminectomy for resection of epidural mass.   - Appreciate hospitalist care.   - some muscle spasm in right lower leg. Can consider Baclofen for this.  - Ok to leave incision open to air as long as it is not draining.  - Please call with any questions or concerns   Cooper Render PA-C Department of Neurosurgery

## 2021-10-17 ENCOUNTER — Other Ambulatory Visit: Payer: Self-pay

## 2021-10-17 ENCOUNTER — Encounter (HOSPITAL_COMMUNITY): Payer: Self-pay | Admitting: Physical Medicine and Rehabilitation

## 2021-10-17 ENCOUNTER — Encounter: Payer: Self-pay | Admitting: Family Medicine

## 2021-10-17 ENCOUNTER — Telehealth: Payer: Self-pay

## 2021-10-17 ENCOUNTER — Inpatient Hospital Stay: Payer: 59 | Attending: Anatomic Pathology & Clinical Pathology

## 2021-10-17 ENCOUNTER — Inpatient Hospital Stay (HOSPITAL_COMMUNITY)
Admission: RE | Admit: 2021-10-17 | Discharge: 2021-11-19 | DRG: 052 | Disposition: A | Discharge status: Home / Self Care | Payer: BC Managed Care – PPO | Source: Other Acute Inpatient Hospital | Attending: Physical Medicine and Rehabilitation | Admitting: Physical Medicine and Rehabilitation

## 2021-10-17 ENCOUNTER — Inpatient Hospital Stay (HOSPITAL_COMMUNITY): Payer: BC Managed Care – PPO

## 2021-10-17 DIAGNOSIS — Z8249 Family history of ischemic heart disease and other diseases of the circulatory system: Secondary | ICD-10-CM

## 2021-10-17 DIAGNOSIS — C7951 Secondary malignant neoplasm of bone: Secondary | ICD-10-CM | POA: Diagnosis not present

## 2021-10-17 DIAGNOSIS — J690 Pneumonitis due to inhalation of food and vomit: Secondary | ICD-10-CM | POA: Diagnosis not present

## 2021-10-17 DIAGNOSIS — I824Z2 Acute embolism and thrombosis of unspecified deep veins of left distal lower extremity: Secondary | ICD-10-CM | POA: Diagnosis not present

## 2021-10-17 DIAGNOSIS — I82462 Acute embolism and thrombosis of left calf muscular vein: Secondary | ICD-10-CM | POA: Diagnosis not present

## 2021-10-17 DIAGNOSIS — K219 Gastro-esophageal reflux disease without esophagitis: Secondary | ICD-10-CM | POA: Diagnosis present

## 2021-10-17 DIAGNOSIS — I10 Essential (primary) hypertension: Secondary | ICD-10-CM | POA: Diagnosis not present

## 2021-10-17 DIAGNOSIS — I69328 Other speech and language deficits following cerebral infarction: Secondary | ICD-10-CM

## 2021-10-17 DIAGNOSIS — Z96643 Presence of artificial hip joint, bilateral: Secondary | ICD-10-CM | POA: Diagnosis present

## 2021-10-17 DIAGNOSIS — D62 Acute posthemorrhagic anemia: Secondary | ICD-10-CM | POA: Diagnosis present

## 2021-10-17 DIAGNOSIS — I69391 Dysphagia following cerebral infarction: Secondary | ICD-10-CM

## 2021-10-17 DIAGNOSIS — R1310 Dysphagia, unspecified: Secondary | ICD-10-CM | POA: Diagnosis present

## 2021-10-17 DIAGNOSIS — R319 Hematuria, unspecified: Secondary | ICD-10-CM | POA: Diagnosis not present

## 2021-10-17 DIAGNOSIS — Z7401 Bed confinement status: Secondary | ICD-10-CM | POA: Diagnosis not present

## 2021-10-17 DIAGNOSIS — R5381 Other malaise: Secondary | ICD-10-CM | POA: Diagnosis present

## 2021-10-17 DIAGNOSIS — T45515A Adverse effect of anticoagulants, initial encounter: Secondary | ICD-10-CM | POA: Diagnosis not present

## 2021-10-17 DIAGNOSIS — C7940 Secondary malignant neoplasm of unspecified part of nervous system: Secondary | ICD-10-CM | POA: Diagnosis not present

## 2021-10-17 DIAGNOSIS — R0982 Postnasal drip: Secondary | ICD-10-CM | POA: Diagnosis not present

## 2021-10-17 DIAGNOSIS — G8254 Quadriplegia, C5-C7 incomplete: Secondary | ICD-10-CM | POA: Diagnosis not present

## 2021-10-17 DIAGNOSIS — I82409 Acute embolism and thrombosis of unspecified deep veins of unspecified lower extremity: Secondary | ICD-10-CM | POA: Diagnosis not present

## 2021-10-17 DIAGNOSIS — A419 Sepsis, unspecified organism: Secondary | ICD-10-CM | POA: Diagnosis present

## 2021-10-17 DIAGNOSIS — K0510 Chronic gingivitis, plaque induced: Secondary | ICD-10-CM | POA: Diagnosis present

## 2021-10-17 DIAGNOSIS — I69319 Unspecified symptoms and signs involving cognitive functions following cerebral infarction: Secondary | ICD-10-CM | POA: Diagnosis not present

## 2021-10-17 DIAGNOSIS — C490 Malignant neoplasm of connective and soft tissue of head, face and neck: Secondary | ICD-10-CM | POA: Diagnosis not present

## 2021-10-17 DIAGNOSIS — Z510 Encounter for antineoplastic radiation therapy: Secondary | ICD-10-CM | POA: Diagnosis not present

## 2021-10-17 DIAGNOSIS — G8250 Quadriplegia, unspecified: Secondary | ICD-10-CM | POA: Diagnosis not present

## 2021-10-17 DIAGNOSIS — I959 Hypotension, unspecified: Secondary | ICD-10-CM | POA: Diagnosis present

## 2021-10-17 DIAGNOSIS — R258 Other abnormal involuntary movements: Secondary | ICD-10-CM | POA: Diagnosis present

## 2021-10-17 DIAGNOSIS — D6832 Hemorrhagic disorder due to extrinsic circulating anticoagulants: Secondary | ICD-10-CM | POA: Diagnosis not present

## 2021-10-17 DIAGNOSIS — Z23 Encounter for immunization: Secondary | ICD-10-CM

## 2021-10-17 DIAGNOSIS — J9811 Atelectasis: Secondary | ICD-10-CM | POA: Diagnosis not present

## 2021-10-17 DIAGNOSIS — N319 Neuromuscular dysfunction of bladder, unspecified: Secondary | ICD-10-CM | POA: Diagnosis not present

## 2021-10-17 DIAGNOSIS — K592 Neurogenic bowel, not elsewhere classified: Secondary | ICD-10-CM | POA: Diagnosis present

## 2021-10-17 DIAGNOSIS — C7931 Secondary malignant neoplasm of brain: Secondary | ICD-10-CM | POA: Diagnosis not present

## 2021-10-17 DIAGNOSIS — Z79890 Hormone replacement therapy (postmenopausal): Secondary | ICD-10-CM

## 2021-10-17 DIAGNOSIS — M7989 Other specified soft tissue disorders: Secondary | ICD-10-CM | POA: Diagnosis not present

## 2021-10-17 DIAGNOSIS — J90 Pleural effusion, not elsewhere classified: Secondary | ICD-10-CM | POA: Diagnosis not present

## 2021-10-17 DIAGNOSIS — E039 Hypothyroidism, unspecified: Secondary | ICD-10-CM | POA: Diagnosis not present

## 2021-10-17 DIAGNOSIS — C61 Malignant neoplasm of prostate: Secondary | ICD-10-CM | POA: Diagnosis present

## 2021-10-17 DIAGNOSIS — Z79899 Other long term (current) drug therapy: Secondary | ICD-10-CM

## 2021-10-17 DIAGNOSIS — D72829 Elevated white blood cell count, unspecified: Secondary | ICD-10-CM | POA: Diagnosis not present

## 2021-10-17 DIAGNOSIS — I951 Orthostatic hypotension: Secondary | ICD-10-CM | POA: Diagnosis not present

## 2021-10-17 DIAGNOSIS — E785 Hyperlipidemia, unspecified: Secondary | ICD-10-CM | POA: Diagnosis present

## 2021-10-17 DIAGNOSIS — R197 Diarrhea, unspecified: Secondary | ICD-10-CM | POA: Diagnosis not present

## 2021-10-17 DIAGNOSIS — R252 Cramp and spasm: Secondary | ICD-10-CM | POA: Diagnosis not present

## 2021-10-17 DIAGNOSIS — Z981 Arthrodesis status: Secondary | ICD-10-CM

## 2021-10-17 DIAGNOSIS — R0902 Hypoxemia: Secondary | ICD-10-CM | POA: Diagnosis not present

## 2021-10-17 DIAGNOSIS — D649 Anemia, unspecified: Secondary | ICD-10-CM | POA: Diagnosis not present

## 2021-10-17 DIAGNOSIS — M4802 Spinal stenosis, cervical region: Secondary | ICD-10-CM | POA: Diagnosis not present

## 2021-10-17 LAB — GLUCOSE, CAPILLARY
Glucose-Capillary: 91 mg/dL (ref 70–99)
Glucose-Capillary: 106 mg/dL — ABNORMAL HIGH (ref 70–99)
Glucose-Capillary: 100 mg/dL — ABNORMAL HIGH (ref 70–99)

## 2021-10-17 LAB — CHOLESTEROL, TOTAL: Cholesterol: 115 mg/dL (ref 0–200)

## 2021-10-17 MED ORDER — BISACODYL 10 MG RE SUPP
10.0000 mg | Freq: Every day | RECTAL | Status: DC
  Administered 2021-10-17 – 2021-10-30 (×14): 10 mg via RECTAL
  Filled 2021-10-17 (×14): qty 1

## 2021-10-17 MED ORDER — TRAZODONE HCL 50 MG PO TABS: 25.0000 mg | ORAL_TABLET | Freq: Every evening | ORAL | Status: DC | PRN

## 2021-10-17 MED ORDER — POLYETHYLENE GLYCOL 3350 17 G PO PACK: 17.0000 g | PACK | Freq: Every day | ORAL | Status: DC

## 2021-10-17 MED ORDER — DIPHENHYDRAMINE HCL 12.5 MG/5ML PO ELIX: 12.5000 mg | ORAL_SOLUTION | Freq: Four times a day (QID) | ORAL | Status: DC | PRN

## 2021-10-17 MED ORDER — AMOXICILLIN-POT CLAVULANATE 875-125 MG PO TABS
1.0000 | ORAL_TABLET | Freq: Two times a day (BID) | ORAL | Status: AC
  Administered 2021-10-17 – 2021-10-21 (×9): 1 via ORAL
  Filled 2021-10-17 (×9): qty 1

## 2021-10-17 MED ORDER — ENOXAPARIN SODIUM 60 MG/0.6ML IJ SOSY
0.5000 mg/kg | PREFILLED_SYRINGE | INTRAMUSCULAR | Status: DC
  Administered 2021-10-17: 50 mg via SUBCUTANEOUS
  Filled 2021-10-17: qty 0.6

## 2021-10-17 MED ORDER — DOCUSATE SODIUM 100 MG PO CAPS
200.0000 mg | ORAL_CAPSULE | Freq: Every day | ORAL | Status: DC
  Administered 2021-10-18 – 2021-11-19 (×33): 200 mg via ORAL
  Filled 2021-10-17 (×36): qty 2

## 2021-10-17 MED ORDER — SALINE SPRAY 0.65 % NA SOLN: 1.0000 | NASAL | Status: DC | PRN

## 2021-10-17 MED ORDER — OXYCODONE-ACETAMINOPHEN 5-325 MG PO TABS
1.0000 | ORAL_TABLET | Freq: Four times a day (QID) | ORAL | Status: DC | PRN
  Filled 2021-10-17: qty 2

## 2021-10-17 MED ORDER — MIDODRINE HCL 5 MG PO TABS
5.0000 mg | ORAL_TABLET | Freq: Three times a day (TID) | ORAL | Status: DC
  Administered 2021-10-17 – 2021-11-19 (×89): 5 mg via ORAL
  Filled 2021-10-17 (×97): qty 1

## 2021-10-17 MED ORDER — ALUM & MAG HYDROXIDE-SIMETH 200-200-20 MG/5ML PO SUSP: 30.0000 mL | ORAL | Status: DC | PRN

## 2021-10-17 MED ORDER — METOPROLOL SUCCINATE ER 25 MG PO TB24: 25.0000 mg | ORAL_TABLET | Freq: Every day | ORAL | 1 refills | Status: DC

## 2021-10-17 MED ORDER — FLUOXETINE HCL 20 MG PO CAPS
20.0000 mg | ORAL_CAPSULE | Freq: Every day | ORAL | Status: DC
  Administered 2021-10-17 – 2021-11-18 (×33): 20 mg via ORAL
  Filled 2021-10-17 (×33): qty 1

## 2021-10-17 MED ORDER — TAMSULOSIN HCL 0.4 MG PO CAPS: 0.4000 mg | ORAL_CAPSULE | Freq: Every day | ORAL | Status: DC

## 2021-10-17 MED ORDER — ADULT MULTIVITAMIN W/MINERALS CH
1.0000 | ORAL_TABLET | Freq: Every day | ORAL | Status: DC
  Administered 2021-10-18 – 2021-11-19 (×33): 1 via ORAL
  Filled 2021-10-17 (×33): qty 1

## 2021-10-17 MED ORDER — DOCUSATE SODIUM 100 MG PO CAPS: 100.0000 mg | ORAL_CAPSULE | Freq: Two times a day (BID) | ORAL | 0 refills | Status: DC

## 2021-10-17 MED ORDER — PROCHLORPERAZINE MALEATE 5 MG PO TABS: 5.0000 mg | ORAL_TABLET | Freq: Four times a day (QID) | ORAL | Status: DC | PRN

## 2021-10-17 MED ORDER — POTASSIUM CHLORIDE CRYS ER 10 MEQ PO TBCR
10.0000 meq | EXTENDED_RELEASE_TABLET | Freq: Every day | ORAL | Status: DC
  Administered 2021-10-17 – 2021-11-18 (×33): 10 meq via ORAL
  Filled 2021-10-17 (×33): qty 1

## 2021-10-17 MED ORDER — POLYETHYLENE GLYCOL 3350 17 G PO PACK: 17.0000 g | PACK | Freq: Every day | ORAL | Status: DC | PRN

## 2021-10-17 MED ORDER — LIDOCAINE HCL URETHRAL/MUCOSAL 2 % EX GEL
CUTANEOUS | Status: DC | PRN
  Filled 2021-10-17: qty 12

## 2021-10-17 MED ORDER — DM-GUAIFENESIN ER 30-600 MG PO TB12
1.0000 | ORAL_TABLET | Freq: Two times a day (BID) | ORAL | Status: DC
  Administered 2021-10-17 – 2021-11-19 (×66): 1 via ORAL
  Filled 2021-10-17 (×66): qty 1

## 2021-10-17 MED ORDER — BISACODYL 10 MG RE SUPP: 10.0000 mg | Freq: Every day | RECTAL | Status: DC | PRN

## 2021-10-17 MED ORDER — PROCHLORPERAZINE 25 MG RE SUPP
12.5000 mg | Freq: Four times a day (QID) | RECTAL | Status: DC | PRN
Start: 2021-10-17 — End: 2021-11-19

## 2021-10-17 MED ORDER — SALINE SPRAY 0.65 % NA SOLN: 1.0000 | NASAL | 0 refills | Status: DC | PRN

## 2021-10-17 MED ORDER — AMOXICILLIN-POT CLAVULANATE 875-125 MG PO TABS
1.0000 | ORAL_TABLET | Freq: Two times a day (BID) | ORAL | 0 refills | Status: DC
Start: 2021-10-17 — End: 2021-11-19

## 2021-10-17 MED ORDER — POLYSACCHARIDE IRON COMPLEX 150 MG PO CAPS: 150.0000 mg | ORAL_CAPSULE | Freq: Every day | ORAL | 3 refills | Status: DC

## 2021-10-17 MED ORDER — POLYSACCHARIDE IRON COMPLEX 150 MG PO CAPS
150.0000 mg | ORAL_CAPSULE | Freq: Every day | ORAL | Status: DC
  Administered 2021-10-18 – 2021-11-19 (×33): 150 mg via ORAL
  Filled 2021-10-17 (×33): qty 1

## 2021-10-17 MED ORDER — ACETAMINOPHEN 325 MG PO TABS
325.0000 mg | ORAL_TABLET | ORAL | Status: DC | PRN
  Administered 2021-10-27 – 2021-11-12 (×6): 650 mg via ORAL
  Administered 2021-11-16: 325 mg via ORAL
  Filled 2021-10-17 (×8): qty 2

## 2021-10-17 MED ORDER — FLUOXETINE HCL 20 MG PO CAPS: 20.0000 mg | ORAL_CAPSULE | Freq: Every day | ORAL | 3 refills | Status: DC

## 2021-10-17 MED ORDER — ATORVASTATIN CALCIUM 40 MG PO TABS
40.0000 mg | ORAL_TABLET | Freq: Every day | ORAL | Status: DC
  Administered 2021-10-18 – 2021-11-19 (×33): 40 mg via ORAL
  Filled 2021-10-17 (×33): qty 1

## 2021-10-17 MED ORDER — CLONAZEPAM 0.5 MG PO TABS: 0.5000 mg | ORAL_TABLET | Freq: Two times a day (BID) | ORAL | 0 refills | Status: DC

## 2021-10-17 MED ORDER — MELATONIN 5 MG PO TABS
5.0000 mg | ORAL_TABLET | Freq: Every evening | ORAL | Status: DC | PRN
  Filled 2021-10-17: qty 1

## 2021-10-17 MED ORDER — MAGNESIUM HYDROXIDE 400 MG/5ML PO SUSP: 30.0000 mL | Freq: Every day | ORAL | Status: DC | PRN

## 2021-10-17 MED ORDER — TAMSULOSIN HCL 0.4 MG PO CAPS
0.4000 mg | ORAL_CAPSULE | Freq: Every day | ORAL | Status: DC
  Administered 2021-10-18 – 2021-11-18 (×32): 0.4 mg via ORAL
  Filled 2021-10-17 (×32): qty 1

## 2021-10-17 MED ORDER — BLISTEX MEDICATED EX OINT: 1.0000 | TOPICAL_OINTMENT | CUTANEOUS | Status: DC | PRN

## 2021-10-17 MED ORDER — LEVOTHYROXINE SODIUM 88 MCG PO TABS
88.0000 ug | ORAL_TABLET | Freq: Every day | ORAL | Status: DC
  Administered 2021-10-18 – 2021-11-19 (×33): 88 ug via ORAL
  Filled 2021-10-17 (×33): qty 1

## 2021-10-17 MED ORDER — ASPIRIN 81 MG PO TBEC
81.0000 mg | DELAYED_RELEASE_TABLET | Freq: Every day | ORAL | 12 refills | Status: AC
Start: 2021-10-18 — End: ?

## 2021-10-17 MED ORDER — FAMOTIDINE 20 MG PO TABS
20.0000 mg | ORAL_TABLET | Freq: Every day | ORAL | Status: DC
  Administered 2021-10-18 – 2021-11-19 (×33): 20 mg via ORAL
  Filled 2021-10-17 (×33): qty 1

## 2021-10-17 MED ORDER — ATORVASTATIN CALCIUM 40 MG PO TABS: 40.0000 mg | ORAL_TABLET | Freq: Every day | ORAL | 0 refills | Status: DC

## 2021-10-17 MED ORDER — LISINOPRIL 5 MG PO TABS: 5.0000 mg | ORAL_TABLET | Freq: Every day | ORAL | 0 refills | Status: DC

## 2021-10-17 MED ORDER — VITAMIN C 500 MG PO TABS
500.0000 mg | ORAL_TABLET | Freq: Every day | ORAL | Status: DC
  Administered 2021-10-18 – 2021-11-19 (×33): 500 mg via ORAL
  Filled 2021-10-17 (×33): qty 1

## 2021-10-17 MED ORDER — FLEET ENEMA 7-19 GM/118ML RE ENEM
1.0000 | ENEMA | Freq: Once | RECTAL | Status: DC | PRN
Start: 2021-10-17 — End: 2021-10-31

## 2021-10-17 MED ORDER — CLONAZEPAM 0.5 MG PO TABS
0.5000 mg | ORAL_TABLET | Freq: Two times a day (BID) | ORAL | Status: DC
  Administered 2021-10-17 – 2021-11-19 (×66): 0.5 mg via ORAL
  Filled 2021-10-17 (×66): qty 1

## 2021-10-17 MED ORDER — PROCHLORPERAZINE EDISYLATE 10 MG/2ML IJ SOLN: 5.0000 mg | Freq: Four times a day (QID) | INTRAMUSCULAR | Status: DC | PRN

## 2021-10-17 MED ORDER — BUDESONIDE 0.5 MG/2ML IN SUSP
0.5000 mg | Freq: Two times a day (BID) | RESPIRATORY_TRACT | Status: DC
  Administered 2021-10-17 – 2021-11-02 (×33): 0.5 mg via RESPIRATORY_TRACT
  Filled 2021-10-17 (×35): qty 2

## 2021-10-17 MED ORDER — LORATADINE 10 MG PO TABS
10.0000 mg | ORAL_TABLET | Freq: Two times a day (BID) | ORAL | Status: DC
  Administered 2021-10-17 – 2021-11-19 (×66): 10 mg via ORAL
  Filled 2021-10-17 (×66): qty 1

## 2021-10-17 MED ORDER — ORAL CARE MOUTH RINSE: 15.0000 mL | OROMUCOSAL | Status: DC | PRN

## 2021-10-17 MED ORDER — ENOXAPARIN SODIUM 40 MG/0.4ML IJ SOSY: 40.0000 mg | PREFILLED_SYRINGE | INTRAMUSCULAR | Status: DC

## 2021-10-17 MED ORDER — PROSOURCE PLUS PO LIQD
30.0000 mL | Freq: Three times a day (TID) | ORAL | Status: DC
  Administered 2021-10-17 – 2021-11-19 (×99): 30 mL via ORAL
  Filled 2021-10-17 (×99): qty 30

## 2021-10-17 MED ORDER — MELOXICAM 7.5 MG PO TABS
15.0000 mg | ORAL_TABLET | Freq: Every day | ORAL | Status: DC
  Administered 2021-10-17 – 2021-11-18 (×33): 15 mg via ORAL
  Filled 2021-10-17 (×33): qty 2

## 2021-10-17 MED ORDER — IPRATROPIUM-ALBUTEROL 0.5-2.5 (3) MG/3ML IN SOLN
3.0000 mL | RESPIRATORY_TRACT | Status: DC | PRN
  Filled 2021-10-17: qty 3

## 2021-10-17 MED ORDER — GUAIFENESIN-DM 100-10 MG/5ML PO SYRP: 5.0000 mL | ORAL_SOLUTION | Freq: Four times a day (QID) | ORAL | Status: DC | PRN

## 2021-10-17 MED ORDER — IPRATROPIUM-ALBUTEROL 0.5-2.5 (3) MG/3ML IN SOLN
3.0000 mL | Freq: Three times a day (TID) | RESPIRATORY_TRACT | Status: DC
Start: 2021-10-17 — End: 2021-10-20
  Administered 2021-10-17 – 2021-10-20 (×8): 3 mL via RESPIRATORY_TRACT
  Filled 2021-10-17 (×8): qty 3

## 2021-10-17 MED ORDER — ASPIRIN 81 MG PO TBEC
81.0000 mg | DELAYED_RELEASE_TABLET | Freq: Every day | ORAL | Status: DC
  Administered 2021-10-18 – 2021-11-19 (×33): 81 mg via ORAL
  Filled 2021-10-17 (×33): qty 1

## 2021-10-17 MED ORDER — DOCUSATE SODIUM 100 MG PO CAPS: 100.0000 mg | ORAL_CAPSULE | Freq: Two times a day (BID) | ORAL | Status: DC

## 2021-10-17 NOTE — H&P (Signed)
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       HPI: Jason [name] is a 66 year old R handed male with history of HTN, chronic neck pain, OA s/p left RTC repair 04/2021 with ongoing weakness and tingling down to 1st and 2nd digit of left hand which progress to RUE weakness. He was sent to ED for sedation prior to MRI C spine 10/03/21 for work up which showed abnormal marrow edema with diffuse involvement of C4  into thoracic spine, marked canal stenosis greatest at C6, L> R foraminal effacement due to extraosseous disease and concerns of metastatic disease v/s infiltrative marrow neoplastic process. He presented to ED on 10/04/21 for further evaluation and Abdominal pelvic CT done revealing moderate severity retroperitoneal and pelvic lymphadenopathy c/w metastatic disease, small lytic areas L4 and S1, right 2nd and 4th ribs, sacrum and left iliac bone. He was taken to OR on 08/13 by Dr. Cari Caraway for C6 cervical laminectomy for resection of tumor. On am of 08/14, he developed significant BUE/BLE weakness with hypotension requiring transfer to ICU, pressors as well as steroids for BP supported.  He was vented (due for repeat MRI due to severe claustrophobia. MRI C spine revealed similar degree of epidural tumor C4-C7, progressive canal stenosis form C4-C7 without abnormal cord signal or infarct.   MRI head, spine showed calvarial metastatic disease in right temporal region and vertex with intracranial extension of tumor along right frontal convexity without mass effect and extracranial extension of tumor into scalp along vertex and small focus in right parietal cortex felt to be small acute infarct. MRI thoracic/lumbar spine revealed extensive osseous metastatic disease throughout thoracolumbar spine and pelvis, thin circumferential epidural  tumor cervicothoracic junction extending inferiorly and of the field-of-view to T3-T4, epidural tumor along posterior endplates at T9, V95 and T12, extensive epidural tumor in lumbar spine with greatest thickness at L1 without high-grade stenosis however superimposed on pre-existing degenerative changes in lumbar spine with severe spinal canal stenosis with cauda equina nerve root compression L3-L5, extra-medullary fluid collection along dorsal aspect of cord beginning at T9/T10 extending into lumbar spine likely subdural in location displacing cord and cauda equina nerve roots without compression Or signal abnormality and prevertebral edema and cervical spine which could be postsurgical in nature.  Incidental findings of new bilateral lower lobe consolidation question developing pneumonia v/s aspiration or atelectasis.  He was extubated without difficulty post imaging studies.  .  Dr. 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 He recommended Firmagon loading dose while awaiting final path as well as outpatient PSMA PET scan for full work up. Dr. Donella Stade with Radiation Onc recommended starting palliative XRT to cervical spine but to be delayed till labor day per Dr. Maryann Alar. Dr. Quinn Axe w/neuro also consulted for input on small parietal stroke seen on MRI brain and recommended avoiding hypotension as well as low dose ASA on hgb stabilizes. Palliative care has discussed [place] with wife who will discuss it with patient "when he is able to discuss it" and currently on full scope of treatment. He developed fever with somnolence, acute hypoxic respiratory failure and tachycardia on 08/19 and IV antibiotics added due to concerns of aspiration PNA.  AKI was treated with IVF and he was transfused with one unit PRBC due to drop in  Hgb to 7.7.  Oxygen needs decreasing form to 2 L per [place] with improvement in secretions management. Lovenox added for DVT prophylaxis 08/17 and  Unasyn transitioned to augmetin today for 5 additional days of antibiotic coverage. Steroids have been weaned off.   ST evaluation without overt s/s of aspiration and general swallow precautions/implications of fatigue with recs to stop therapy 1 hour before meals to allow to rest for conservation energy for meals-->D3 w/chopped meats and rest breaks during meals and staff assist w/meals. 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 CIR recommended due to functional decline.   Plans for XRT simulation on 10/24/21@ 2 pm by Dr. Tammi Klippel  Pt reports started bowel program last night- went OK- feeling less constipated.  Sleeping well.  Having a lot of RLE> LLE spasms but not painful. Not having a lot of pain.  Breathing better since yesterday.  Has foley since unable to void.  Anxious about therapy.     Review of Systems  Constitutional:  Negative for chills and fever.  HENT:  Negative for hearing loss.   Eyes:  Negative for blurred vision.  Respiratory:  Positive for cough and sputum production. Negative for shortness of breath.   Cardiovascular:  Negative for chest pain and palpitations.  Gastrointestinal:  Negative for constipation, heartburn and nausea.       Incontinent of bowel.--just comes out.   Musculoskeletal:  Positive for joint pain. Negative for myalgias.  Skin:  Negative for rash.  Neurological:  Positive for speech change and focal weakness. Negative for dizziness, sensory change and headaches.  Psychiatric/Behavioral:  The patient is nervous/anxious.   All other systems reviewed and are negative.    Past Medical History:  Diagnosis Date   Allergy    Arthritis    osteoarthritis   GERD (gastroesophageal reflux disease)    Hypertension    Hypothyroidism    Renal calculi    Thyroid disease    Hyperthyroidism s/p radioactive iodine ablation    Past Surgical  History:  Procedure Laterality Date   CARDIAC CATHETERIZATION  03/2000   HERNIA REPAIR  5400   umbilical   LEFT HEART CATH AND CORONARY ANGIOGRAPHY Left 03/20/2020   Procedure: LEFT HEART CATH AND CORONARY ANGIOGRAPHY;  Surgeon: Nelva Bush, MD;  Location: [facility];  Service: Cardiovascular;  Laterality: Left;   POSTERIOR CERVICAL FUSION/FORAMINOTOMY N/A 10/06/2021   Procedure: POSTERIOR CERVICAL FUSION/ FORAMINOTOMY LEVEL 3;  Surgeon: Meade Maw, MD;  Location: [facility];  Service: Neurosurgery;  Laterality: N/A;  will need monitoring   RADIOLOGY WITH ANESTHESIA Left 04/18/2021   Procedure: MRI SHOULDER WITHOUT CONTRAST WITH ANESTHESIA;  Surgeon: Radiologist, Medication, MD;  Location: [facility];  Service: Radiology;  Laterality: Left;   RADIOLOGY WITH ANESTHESIA N/A 10/03/2021   Procedure: MRI CERVICAL SPINE WITH ANESTHESIA;  Surgeon: Radiologist, Medication, MD;  Location: [facility];  Service: Radiology;  Laterality: N/A;   REVERSE SHOULDER ARTHROPLASTY Left    ROTATOR CUFF REPAIR  11/2010   Dr [name] Heck   TONSILLECTOMY     TOTAL HIP ARTHROPLASTY Right 02/24/2009   TOTAL HIP ARTHROPLASTY Left 06/2016   Dr Harlow Mares    Family History  Problem Relation Age of Onset   Hypertension Mother    Stroke Mother    Gout Father    Heart attack Father 17   Heart disease Sister    Colon cancer Neg Hx  Esophageal cancer Neg Hx    Rectal cancer Neg Hx    Stomach cancer Neg Hx     Social History: Married. 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 He  reports that he has never smoked. He has never used smokeless tobacco. He reports that he does not currently use alcohol. He reports that he does not use drugs.   Allergies: No Known Allergies   Medications Prior to Admission  Medication Sig Dispense Refill   acetaminophen (TYLENOL) 500 MG tablet Take 1,000 mg by mouth in the morning, at noon, and at bedtime.     Ascorbic Acid (VITA-C PO) Take 1,000  mg by mouth daily.     cetirizine (ZYRTEC) 10 MG tablet Take 10 mg by mouth at bedtime.     CRANBERRY PO Take 500 mg by mouth at bedtime.     cyclobenzaprine (FLEXERIL) 10 MG tablet TAKE 1 TABLET BY MOUTH EVERY DAY AT NIGHT AS NEEDED (Patient taking differently: Take 10 mg by mouth at bedtime. TAKE 1 TABLET BY MOUTH EVERY DAY AT NIGHT AS NEEDED) 90 tablet 1   levothyroxine (SYNTHROID) 88 MCG tablet TAKE 1 TABLET BY MOUTH EVERY DAY (Patient taking differently: Take 88 mcg by mouth daily before breakfast.) 90 tablet 3   loratadine (CLARITIN) 10 MG tablet Take 10 mg by mouth in the morning and at bedtime.     meloxicam (MOBIC) 15 MG tablet Take 1 tablet (15 mg total) by mouth daily. 7 tablet 0   Multiple Vitamin (MULTIVITAMIN) tablet Take 1 tablet by mouth daily.     potassium chloride (KLOR-CON) 10 MEQ tablet TAKE 1 TABLET BY MOUTH EVERY DAY (Patient taking differently: Take 10 mEq by mouth at bedtime.) 90 tablet 0   rosuvastatin (CRESTOR) 5 MG tablet TAKE 1 TABLET (5 MG TOTAL) BY MOUTH DAILY. 90 tablet 2   spironolactone (ALDACTONE) 25 MG tablet TAKE 1/2 TABLET BY MOUTH EVERY DAY 45 tablet 2   tamsulosin (FLOMAX) 0.4 MG CAPS capsule TAKE 1 CAPSULE BY MOUTH EVERY DAY 90 capsule 3   torsemide (DEMADEX) 10 MG tablet TAKE 1 TABLET BY MOUTH EVERY DAY 90 tablet 1   [DISCONTINUED] lisinopril (ZESTRIL) 20 MG tablet TAKE 1 TABLET BY MOUTH EVERY DAY 90 tablet 1   [DISCONTINUED] metoprolol succinate (TOPROL-XL) 100 MG 24 hr tablet TAKE 1 TABLET BY MOUTH EVERY DAY WITH OR IMMEDIATELY FOLLOWING A MEAL (Patient taking differently: Take 100 mg by mouth daily.) 90 tablet 1   amoxicillin (AMOXIL) 500 MG capsule Take 2,000 mg by mouth See admin instructions. Take 4 tablets by mouth prior to dental procedures. (Patient not taking: Reported on 10/05/2021)     Menthol, Topical Analgesic, (ICY HOT) 5 % PTCH Apply 1-2 patches topically daily as needed (pain).     oxyCODONE-acetaminophen (PERCOCET/ROXICET) 5-325 MG tablet  Take 1 tablet by mouth every 4 (four) hours as needed for severe pain. 30 tablet 0   sildenafil (REVATIO) 20 MG tablet Take 3-5 tablets (60-100 mg total) by mouth daily as needed. 50 tablet 11      Home: Home Living Family/patient expects to be discharged to:: Private residence Living Arrangements: Spouse/significant other Available Help at Discharge: Family, Available 24 hours/day (wife works remote for [employer]) Type of Home: [employer] Home Access: Stairs to enter Technical brewer of Steps: 3 Entrance Stairs-Rails: Left [place]: One level Bathroom Shower/Tub: Chiropodist: Standard Bathroom Accessibility: Yes Home Equipment: Conservation officer, nature (2 wheels), [employer] - single point, Other (comment), Grab  bars - tub/shower  Lives With: Spouse   Functional History: Prior Function Prior Level of Function : Independent/Modified Independent Mobility Comments: indep no AD, no falls ADLs Comments: indep ADL/IADL, works at [employer] improvement  Functional Status:  Mobility: Bed Mobility Overal bed mobility: Needs Assistance Bed Mobility: Supine to Sit, Sit to Supine, Rolling Rolling: Total assist, +2 for physical assistance Supine to sit: Total assist, +2 for physical assistance Sit to supine: Total assist, +2 for physical assistance General bed mobility comments: verbal cues for technique to reach across midline to facilitate rolling and for right elbow extension to promote upright trunk position. patient continues to require +2 person assistance for mobility efforts. no significant pain reported with activity. Transfers General transfer comment: unsafe to attempt due to severity of weakness. would need a lift for OOB activity at this time      ADL: ADL Overall ADL's : Needs assistance/impaired Eating/Feeding: Maximal assistance, Bed level Grooming: Wash/dry face, Maximal assistance, Bed level Upper Body Dressing : Maximal assistance Upper Body Dressing  Details (indicate cue type and reason): anticipated Lower Body Dressing: Total assistance Lower Body Dressing Details (indicate cue type and reason): at edge of bed Toileting- Clothing Manipulation and Hygiene: Maximal assistance, Total assistance Toileting - Clothing Manipulation Details (indicate cue type and reason): pt with foley at this time, MAX-TOTAL A for peri care Functional mobility during ADLs: Maximal assistance, Total assistance, +2 for physical assistance, +2 for safety/equipment General ADL Comments: TOTAL A x2 pericare bed level. SETUP + MAX A face washing and nose blowing in sitting, assist for sitting balance.  Cognition: Cognition Overall Cognitive Status: Within Functional Limits for tasks assessed Orientation Level: Oriented X4 Cognition Arousal/Alertness: Awake/alert Behavior During Therapy: WFL for tasks assessed/performed Overall Cognitive Status: Within Functional Limits for tasks assessed General Comments: good insight into deficits, follows commands   Blood pressure 139/76, pulse 90, temperature 98.7 F (37.1 C), temperature source Oral, resp. rate 16, height '5\' 9"'$  (1.753 m), weight 99.8 kg, SpO2 95 %. Physical Exam Vitals and nursing note reviewed.  Constitutional:      General: He is not in acute distress.    Appearance: Normal appearance. He is obese.     Comments: Sitting up in bed with soft call bell by R hand- propped on pillows; appears stated age; awake, alert, wearing 2L O2 by [place], NAD  HENT:     Head: Normocephalic and atraumatic.     Comments: No facial droop seen    Right Ear: External ear normal.     Left Ear: External ear normal.     Nose: Nose normal. No congestion.     Mouth/Throat:     Mouth: Mucous membranes are dry.     Pharynx: Oropharynx is clear. No oropharyngeal exudate.  Eyes:     General:        Right eye: No discharge.        Left eye: No discharge.     Extraocular Movements: Extraocular movements intact.  Neck:      Comments: Posterior neck incision C/D/I with staples in place. Some underlying edema with surrounding resolving ecchymosis.  Cardiovascular:     Rate and Rhythm: Normal rate and regular rhythm.     Heart sounds: Normal heart sounds. No murmur heard.    No gallop.  Pulmonary:     Comments: Decreased at bases B/L- no W/R/R this AM; good air movement in upper lobes Abdominal:     General: Bowel sounds are normal. There is  no distension.     Palpations: Abdomen is soft.     Tenderness: There is no abdominal tenderness.  Genitourinary:    Comments: Medium amber urine in foley Musculoskeletal:     Cervical back: Neck supple. Tenderness present.     Comments: RUE- biceps 5/5; WE 4-/5; Triceps 2+/5; Grip 3-/5; FA 2-/5 LUE- biceps 5-/5; WE 4/5; Triceps 2+/5' Grip 3+/5; FA 2-/5 RLE- 0/5 in RLE LLE- HF 1/5; KE 2-/5; DF 1/5; PF 2-/5; EHL 1/5   Skin:    General: Skin is warm and dry.  Neurological:     Mental Status: He is alert.     Comments: Soft voice with weak cough. Decreased postural control --listing to the right and neck with decreased ROM. Able to passively get to midline without discomfort.   Decreased to light touch at L1 down to S1 B/L- unable to check S2-S5 today Almost constant spasms of RLE during exam; a few spasms seen on LLE Clonus sustained on RLE; and a few beats on LLE Hoffman's (+) B/L Ue's;  Ox3- but very slightly delayed on responses  Psychiatric:        Mood and Affect: Mood normal.        Behavior: Behavior normal.     Results for orders placed or performed during the hospital encounter of 10/04/21 (from the past 48 hour(s))  Glucose, capillary     Status: None   Collection Time: 10/15/21  4:42 PM  Result Value Ref Range   Glucose-Capillary 93 70 - 99 mg/dL    Comment: Glucose reference range applies only to samples taken after fasting for at least 8 hours.  Glucose, capillary     Status: Abnormal   Collection Time: 10/15/21  7:25 PM  Result Value Ref Range    Glucose-Capillary 58 (L) 70 - 99 mg/dL    Comment: Glucose reference range applies only to samples taken after fasting for at least 8 hours.  Glucose, capillary     Status: None   Collection Time: 10/15/21  7:49 PM  Result Value Ref Range   Glucose-Capillary 71 70 - 99 mg/dL    Comment: Glucose reference range applies only to samples taken after fasting for at least 8 hours.  Glucose, capillary     Status: Abnormal   Collection Time: 10/15/21  9:44 PM  Result Value Ref Range   Glucose-Capillary 125 (H) 70 - 99 mg/dL    Comment: Glucose reference range applies only to samples taken after fasting for at least 8 hours.  Glucose, capillary     Status: Abnormal   Collection Time: 10/15/21 11:39 PM  Result Value Ref Range   Glucose-Capillary 118 (H) 70 - 99 mg/dL    Comment: Glucose reference range applies only to samples taken after fasting for at least 8 hours.  Glucose, capillary     Status: None   Collection Time: 10/16/21  4:10 AM  Result Value Ref Range   Glucose-Capillary 96 70 - 99 mg/dL    Comment: Glucose reference range applies only to samples taken after fasting for at least 8 hours.  Glucose, capillary     Status: Abnormal   Collection Time: 10/16/21  8:31 AM  Result Value Ref Range   Glucose-Capillary 103 (H) 70 - 99 mg/dL    Comment: Glucose reference range applies only to samples taken after fasting for at least 8 hours.  Glucose, capillary     Status: Abnormal   Collection Time: 10/16/21 11:57 AM  Result Value  Ref Range   Glucose-Capillary 117 (H) 70 - 99 mg/dL    Comment: Glucose reference range applies only to samples taken after fasting for at least 8 hours.  Comprehensive metabolic panel     Status: Abnormal   Collection Time: 10/16/21  3:17 PM  Result Value Ref Range   Sodium 136 135 - 145 mmol/L   Potassium 3.7 3.5 - 5.1 mmol/L   Chloride 107 98 - 111 mmol/L   CO2 23 22 - 32 mmol/L   Glucose, Bld 144 (H) 70 - 99 mg/dL    Comment: Glucose reference range  applies only to samples taken after fasting for at least 8 hours.   BUN 16 8 - 23 mg/dL   Creatinine, Ser 0.52 (L) 0.61 - 1.24 mg/dL   Calcium 7.8 (L) 8.9 - 10.3 mg/dL   Total Protein 5.5 (L) 6.5 - 8.1 g/dL   Albumin 2.7 (L) 3.5 - 5.0 g/dL   AST 76 (H) 15 - 41 U/L   ALT 112 (H) 0 - 44 U/L   Alkaline Phosphatase 315 (H) 38 - 126 U/L   Total Bilirubin 1.0 0.3 - 1.2 mg/dL   GFR, Estimated >60 >60 mL/min    Comment: (NOTE) Calculated using the CKD-EPI Creatinine Equation (2021)    Anion gap 6 5 - 15    Comment: Performed at [facility], [address] [place], [place] [address]  Glucose, capillary     Status: Abnormal   Collection Time: 10/16/21  5:01 PM  Result Value Ref Range   Glucose-Capillary 125 (H) 70 - 99 mg/dL    Comment: Glucose reference range applies only to samples taken after fasting for at least 8 hours.  Glucose, capillary     Status: Abnormal   Collection Time: 10/16/21  7:34 PM  Result Value Ref Range   Glucose-Capillary 114 (H) 70 - 99 mg/dL    Comment: Glucose reference range applies only to samples taken after fasting for at least 8 hours.  Glucose, capillary     Status: Abnormal   Collection Time: 10/16/21 11:38 PM  Result Value Ref Range   Glucose-Capillary 118 (H) 70 - 99 mg/dL    Comment: Glucose reference range applies only to samples taken after fasting for at least 8 hours.  Glucose, capillary     Status: None   Collection Time: 10/17/21  3:20 AM  Result Value Ref Range   Glucose-Capillary 91 70 - 99 mg/dL    Comment: Glucose reference range applies only to samples taken after fasting for at least 8 hours.  Glucose, capillary     Status: Abnormal   Collection Time: 10/17/21  7:55 AM  Result Value Ref Range   Glucose-Capillary 106 (H) 70 - 99 mg/dL    Comment: Glucose reference range applies only to samples taken after fasting for at least 8 hours.   No results found.    Blood pressure 139/76, pulse 90, temperature 98.7 F (37.1 C),  temperature source Oral, resp. rate 16, height '5\' 9"'$  (1.753 m), weight 99.8 kg, SpO2 95 %.  Medical Problem List and Plan: 1. 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  Antithrombotics: -DVT/anticoagulation:  Pharmaceutical: Lovenox. Check dopplers in am.   -antiplatelet therapy: ASA 3. Pain Management: Oxycodone prn.  4. Mood/Behavior/Sleep: LCSW to follow for evaluation and support.   -antipsychotic agents: N/A 5. Neuropsych/cognition: This  patient is capable of making decisions on his own behalf. 6. Skin/Wound Care: Routine pressure relief measures   --monitor incision for healing.  7. Fluids/Electrolytes/Nutrition: Monitor I/O. Check CMET in am.  8. Prostate AdenoCA w/mets: Loading dose Firmagon 240 mg 10/06/21 per Dr. Tasia Catchings --Charlesetta Garibaldi changed to [facility] due to insurance coverage--next dose around 09/13. --Simulation for XRT 09/31 at 2 pm at [facility] 9. Dysphagia: Continue D3 w/chopped meats and assist at meals. Aspiration precautions.  10. Neurogenic bladder: continue foley for now 11. Neurogenic bowel: Will get KUB to determine stool burden --Colace in am with suppository after supper.  12. Sepsis/Likely aspiration PNA: Now on Augmentin--antibiotic D#5/10. BC pend-->neg so far.   --Follow up CXR in am.   --pulmonary hygiene for atelectasis/Asymmetric volume loss on right.  13. Abnormal LFTs: Improved overall except for elevation in Aphos--315. Will recheck in am 14. ABLA: Recheck in am 15. Leucocytosis: Recheck in am. Monitor for fevers/other signs of infection.   16. Hypotension: Will continue midodrine for now.    I have personally performed a face to face diagnostic evaluation of this patient and formulated the key components of the plan.  Additionally, I have personally reviewed laboratory data, imaging studies, as well as relevant notes and  concur with the physician assistant's documentation above.   The patient's status has not changed from the original H&P.  Any changes in documentation from the acute care chart have been noted above.     Bary Leriche, PA-C 10/17/2021

## 2021-10-17 NOTE — Progress Notes (Signed)
  Radiation Oncology         [phone]) [phone] ________________________________  Name: Jason [name] MRN: [id number]  Date: 10/17/2021  DOB: [birth year]  Chart Note:  Patient transferred and requires post-op radiation.  We'll coordinate radiation appointments after 2 pm to avoid conflict with rehab work.  He'll come to [facility] for consult and simulation Thursday 10/24/21  ________________________________  Sheral Apley. Tammi Klippel, M.D.

## 2021-10-17 NOTE — Progress Notes (Signed)
INPATIENT REHABILITATION ADMISSION NOTE   Arrival Method: Via [facility] from [facility] at 3:00     Mental Orientation: A/o x 4    Assessment: complete   Skin: assessed    IV'S: R forearm    Pain: left shoulder    Tubes and Drains: foley    Safety Measures: soft touch within reach , 3 side rail up,    Vital Signs: in flow sheets    Height and Weight: in flow sheets    Rehab Orientation: complete , wife at bedside , answered all questions.    Family: wife at bedside    Arn Medal LPN

## 2021-10-17 NOTE — Plan of Care (Signed)

## 2021-10-17 NOTE — Progress Notes (Signed)
encounter: ED to Hosp-Admission (Discharged) from 10/04/2021 in [place] (1A)   Signed      PMR Admission Coordinator Pre-Admission Assessment   Patient: Jason [name] is an 66 y.o., male MRN: [id number] DOB: 17-Jun-1955 Height: '5\' 9"'  (175.3 cm) Weight: 99.8 kg   Insurance Information HMO:     PPO: yes     PCP:      IPA:      80/20:      OTHER:  PRIMARY: [employer] of [place]      Policy#: [id number]      Subscriber: pt CM Name: Via Fax from Health Management      Phone#: via fax [phone] option #7     Fax#: [phone] Pre-Cert#: [id number] approved 8/24 until 8/30. Updates due 8/31      Employer: [employer] Benefits:  Phone #: [phone] option 2     Name: 8/23 Eff. Date: 07/17/2005     Deduct: $1000 ( met)      Out of Pocket Max: $6000 ( met)      Life Max: none CIR: 70%      SNF: 70% limit 120 days per year Outpatient: 70%     Co-Pay: 25 combined visits Home Health: 100%      Co-Pay: 120 visits DME: 70%     Co-Pay: 30% Providers: in network  SECONDARY: [employer] Policy # [id number]         Financial Counselor:       Phone#:    The "Data Collection Information Summary" for patients in Inpatient Rehabilitation Facilities with attached "Privacy Act [place] Records" was provided and verbally reviewed with: N/A   Emergency Contact Information Contact Information       Name Relation Home Work [place] [phone]         Current Medical History  Patient Admitting Diagnosis: Metastatic prostate Cancer   History of Present Illness: 66 year old male with history of essential HTN, hypothyroidism, GERD and osteoarthritis. Presented to [facility] ER on 10/04/21 with continued chronic neck pain since last December and associated brief symptoms left hand thumb and index paresthesias with tingling and numbness without weakness. Recent MRI on 8/10 that revealed multifocal abnormal marrow signal with  relatively diffuse involvement of C4 into the thoracic spine with associated epidural disease, greatest at C6 where there is marked canal stenosis without cord compression.  There was left greater than right foraminal effacement due to extraosseous disease.  It was thought that this may reflect metastatic disease or marrow infiltrative neoplastic process.    CT scan revealed moderate severity retroperitoneal and pelvic lymphadenopathy consistent with metastatic disease, Small lytic lesions at the levels of L4 and S1. With diffusely sclerotic changes involving the second an fourth right ribs, sacrum and left iliac bone. Findings consistent with osseous metastasis. Neurosurgery consulted and had emergent C6 cervical laminectomy for resection of epidural tumor on 8/13. Developed worsening shock postoperatively and respiratory failure requiring intubation. Now extubates to HFNC and now O2 3 liters [place]. Felt high risk for aspiration and re intubation. 8/19 PCXR increase bibasilar atelectasis possible RLL aspiration. On Unasyn. [name] follow up.    Neurology consulted on 10/08/21 MRI on 8/15 imaging revealed small acute ischemic right parietal infarct.   Oncology consulted s/p Firmagon x 1 with next dose due 9/13. Radiation Oncology consulted for possible palliative radiation to cervical spine. Possible delay until after Labor day. Plan is for 30 Gray in 10 fractions.  Patient's medical record from [facility] has been reviewed by the rehabilitation admission coordinator and physician.   Past Medical History      Past Medical History:  Diagnosis Date   Allergy     Arthritis      osteoarthritis   GERD (gastroesophageal reflux disease)     Hypertension     Hypothyroidism     Thyroid disease      Hyperthyroidism s/p radioactive iodine ablation    Has the patient had major surgery during 100 days prior to admission? Yes   Family History   family history includes Gout in his father; Heart attack (age of onset:  27) in his father; Heart disease in his sister; Hypertension in his mother; Stroke in his mother.   Current Medications   Current Facility-Administered Medications:    (feeding supplement) PROSource Plus liquid 30 mL, 30 mL, Oral, TID BM, Fritzi Mandes, MD, 30 mL at 10/17/21 0911   0.9 %  sodium chloride infusion, 250 mL, Intravenous, Continuous, Candee Furbish, MD, Paused at 10/15/21 1451   acetaminophen (TYLENOL) tablet 650 mg, 650 mg, Oral, Q6H PRN, 650 mg at 10/16/21 0543 **OR** acetaminophen (TYLENOL) suppository 650 mg, 650 mg, Rectal, Q6H PRN, Meade Maw, MD   amoxicillin-clavulanate (AUGMENTIN) 875-125 MG per tablet 1 tablet, 1 tablet, Oral, Q12H, Fritzi Mandes, MD, 1 tablet at 10/17/21 7116   ascorbic acid (VITAMIN C) tablet 500 mg, 500 mg, Oral, Daily, Meade Maw, MD, 500 mg at 10/17/21 5790   aspirin EC tablet 81 mg, 81 mg, Oral, Daily, Fritzi Mandes, MD, 81 mg at 10/17/21 0909   atorvastatin (LIPITOR) tablet 40 mg, 40 mg, Oral, Daily, Derek Jack, MD, 40 mg at 10/17/21 0909   budesonide (PULMICORT) nebulizer solution 0.5 mg, 0.5 mg, Nebulization, BID, Kasa, Kurian, MD, 0.5 mg at 10/17/21 0747   Chlorhexidine Gluconate Cloth 2 % PADS 6 each, 6 each, Topical, Daily, Allie Bossier, MD, 6 each at 10/17/21 0911   clonazePAM (KLONOPIN) tablet 0.5 mg, 0.5 mg, Oral, BID, Candee Furbish, MD, 0.5 mg at 10/17/21 3833   dextromethorphan-guaiFENesin (Pettus DM) 30-600 MG per 12 hr tablet 1 tablet, 1 tablet, Oral, BID, Meade Maw, MD, 1 tablet at 10/17/21 0909   docusate sodium (COLACE) capsule 100 mg, 100 mg, Oral, BID, Benita Gutter, RPH, 100 mg at 10/17/21 0910   enoxaparin (LOVENOX) injection 50 mg, 0.5 mg/kg, Subcutaneous, Q24H, Dallie Piles, RPH, 50 mg at 10/16/21 2142   famotidine (PEPCID) tablet 20 mg, 20 mg, Oral, Daily, Benita Gutter, RPH, 20 mg at 10/17/21 0910   feeding supplement (BOOST / RESOURCE BREEZE) liquid 1 Container, 1 Container, Oral, TID  BM, Fritzi Mandes, MD, 1 Container at 10/17/21 0911   FLUoxetine (PROZAC) capsule 20 mg, 20 mg, Oral, QHS, Candee Furbish, MD, 20 mg at 10/16/21 2201   hydrALAZINE (APRESOLINE) injection 5 mg, 5 mg, Intravenous, Q6H PRN, Emeterio Reeve, DO   insulin aspart (novoLOG) injection 0-9 Units, 0-9 Units, Subcutaneous, Q4H, Rust-Chester, Britton L, NP, 1 Units at 10/15/21 1140   ipratropium-albuterol (DUONEB) 0.5-2.5 (3) MG/3ML nebulizer solution 3 mL, 3 mL, Nebulization, TID, Allie Bossier, MD, 3 mL at 10/17/21 0746   iron polysaccharides (NIFEREX) capsule 150 mg, 150 mg, Oral, Daily, Fritzi Mandes, MD, 150 mg at 10/17/21 3832   levothyroxine (SYNTHROID) tablet 88 mcg, 88 mcg, Oral, Q0600, Meade Maw, MD, 88 mcg at 10/17/21 0510   lip balm (BLISTEX) ointment 1 Application, 1 Application, Topical, PRN, Belue,  Alver Sorrow, RPH   loratadine (CLARITIN) tablet 10 mg, 10 mg, Oral, BID, Meade Maw, MD, 10 mg at 10/17/21 7680   magnesium hydroxide (MILK OF MAGNESIA) suspension 30 mL, 30 mL, Oral, Daily PRN, Meade Maw, MD, 30 mL at 10/05/21 0744   melatonin tablet 5 mg, 5 mg, Oral, QHS PRN, Meade Maw, MD, 5 mg at 10/09/21 2135   meloxicam (MOBIC) tablet 15 mg, 15 mg, Oral, Daily, Meade Maw, MD, 15 mg at 10/16/21 2140   midodrine (PROAMATINE) tablet 5 mg, 5 mg, Oral, TID WC, Allie Bossier, MD, 5 mg at 10/17/21 8811   multivitamin with minerals tablet 1 tablet, 1 tablet, Oral, Daily, Meade Maw, MD, 1 tablet at 10/17/21 0909   ondansetron (ZOFRAN) tablet 4 mg, 4 mg, Oral, Q6H PRN **OR** ondansetron (ZOFRAN) injection 4 mg, 4 mg, Intravenous, Q6H PRN, Meade Maw, MD   Oral care mouth rinse, 15 mL, Mouth Rinse, PRN, Fritzi Mandes, MD   oxyCODONE-acetaminophen (PERCOCET/ROXICET) 5-325 MG per tablet 1-2 tablet, 1-2 tablet, Oral, Q6H PRN, Candee Furbish, MD   polyethylene glycol (MIRALAX / GLYCOLAX) packet 17 g, 17 g, Oral, Daily, Rust-Chester, Britton L, NP, 17  g at 10/15/21 1038   potassium chloride (KLOR-CON M) CR tablet 10 mEq, 10 mEq, Oral, QHS, Meade Maw, MD, 10 mEq at 10/16/21 2139   sodium chloride (OCEAN) 0.65 % nasal spray 1 spray, 1 spray, Each Nare, PRN, Candee Furbish, MD, 1 spray at 10/09/21 2206   sodium chloride flush (NS) 0.9 % injection 10-40 mL, 10-40 mL, Intracatheter, Q12H, Allie Bossier, MD, 20 mL at 10/16/21 2143   sodium chloride flush (NS) 0.9 % injection 10-40 mL, 10-40 mL, Intracatheter, PRN, Allie Bossier, MD   sorbitol, milk of mag, mineral oil, glycerin (SMOG) enema, 400 mL, Rectal, Once, Allie Bossier, MD   tamsulosin [facility]) capsule 0.4 mg, 0.4 mg, Oral, Daily, Meade Maw, MD, 0.4 mg at 10/17/21 0315   Patients Current Diet:  Diet Order                  DIET DYS 3 Room service appropriate? Yes with Assist; Fluid consistency: Thin  Diet effective now                       Precautions / Restrictions Precautions Precautions: Fall, Cervical Precaution Comments: C6 laminectomy, MAP >65 Restrictions Weight Bearing Restrictions: No    Has the patient had 2 or more falls or a fall with injury in the past year? No   Prior Activity Level Community (5-7x/wk): independent, working and driving   Prior Functional Level Self Care: Did the patient need help bathing, dressing, using the toilet or eating? Independent   Indoor Mobility: Did the patient need assistance with walking from room to room (with or without device)? Independent   Stairs: Did the patient need assistance with internal or external stairs (with or without device)? Independent   Functional Cognition: Did the patient need help planning regular tasks such as shopping or remembering to take medications? Independent   Patient Information Are you of Hispanic, Latino/a,or Spanish origin?: A. No, not of Hispanic, Latino/a, or Spanish origin What is your race?: A. White Do you need or want an interpreter to communicate with a  doctor or health care staff?: 0. No   Patient's Response To:  Health Literacy and Transportation Is the patient able to respond to health literacy and transportation needs?: Yes Health Literacy - How often  do you need to have someone help you when you read instructions, pamphlets, or other written material from your doctor or pharmacy?: Never In the past 12 months, has lack of transportation kept you from medical appointments or from getting medications?: No In the past 12 months, has lack of transportation kept you from meetings, work, or from getting things needed for daily living?: No   Development worker, international aid / [place] Devices/Equipment: None Home Equipment: Conservation officer, nature (2 wheels), [employer] - single point, Other (comment), Grab bars - tub/shower   Prior Device Use: Indicate devices/aids used by the patient prior to current illness, exacerbation or injury? None of the above   Current Functional Level Cognition   Overall Cognitive Status: Within Functional Limits for tasks assessed Orientation Level: Oriented X4 General Comments: good insight into deficits, follows commands    Extremity Assessment (includes Sensation/Coordination)   Upper Extremity Assessment: Generalized weakness, RUE deficits/detail, LUE deficits/detail RUE Deficits / Details: R hand dominant; Mild right tenodesis, AROM; shoulder flexion 90 degrees, elbow WFL, wrist limited by aline; Strength 3/5 in elbow; Weak grip strength with pt unable to make a fist; proprioception intact, sensation intact; edema noted throughout B hands RUE Coordination: decreased fine motor, decreased gross motor LUE Deficits / Details: Baseline pt with LUE deficits following shouler surgery earlier this year; AROM:shoulder flexion: 1/4 full AROM, elbow 3/4 full AROM, wrist with L tenodesis proprioception intact, sensation intact; pt unable to  make a fist, weak grip strength LUE Coordination: decreased fine motor, decreased gross  motor  Lower Extremity Assessment: Defer to PT evaluation RLE Deficits / Details: 0/5 throughout. 3 beats of clonus noted with quick dorsiflexion stretch. hypertonicity. RLE Sensation: decreased light touch (great toe flexion/extension proprioception intact) RLE Coordination: decreased gross motor, decreased fine motor LLE Deficits / Details: trace hamstring activation palpated, trace dorsiflexion, trace hip adduction. otherwise 0/5. absent light touch and deep pressure upper/lateral thigh. 3 beats of clonus noted with quick dorsiflexion strecth. hypertonicity LLE Sensation: decreased light touch (great toe flexion/extension proprioception intact) LLE Coordination: decreased gross motor, decreased fine motor     ADLs   Overall ADL's : Needs assistance/impaired Eating/Feeding: Maximal assistance, Bed level Grooming: Wash/dry face, Maximal assistance, Bed level Upper Body Dressing : Maximal assistance Upper Body Dressing Details (indicate cue type and reason): anticipated Lower Body Dressing: Total assistance Lower Body Dressing Details (indicate cue type and reason): at edge of bed Toileting- Clothing Manipulation and Hygiene: Maximal assistance, Total assistance Toileting - Clothing Manipulation Details (indicate cue type and reason): pt with foley at this time, MAX-TOTAL A for peri care Functional mobility during ADLs: Maximal assistance, Total assistance, +2 for physical assistance, +2 for safety/equipment General ADL Comments: TOTAL A x2 pericare bed level. SETUP + MAX A face washing and nose blowing in sitting, assist for sitting balance.     Mobility   Overal bed mobility: Needs Assistance Bed Mobility: Supine to Sit, Sit to Supine, Rolling Rolling: Total assist, +2 for physical assistance Supine to sit: Total assist, +2 for physical assistance Sit to supine: Total assist, +2 for physical assistance General bed mobility comments: verbal cues for technique to reach across midline to  facilitate rolling and for right elbow extension to promote upright trunk position. patient continues to require +2 person assistance for mobility efforts. no significant pain reported with activity.     Transfers   General transfer comment: unsafe to attempt due to severity of weakness. would need a lift for OOB activity at this time  Ambulation / Gait / Stairs / Proofreader / Balance Dynamic Sitting Balance Sitting balance - Comments: minimal protective righting reactions noted, although patient is aware when he is not in midline. significant weakness throughout and external support required to maintain mildine sitting balance, at least moderate assistance. emphasis on trunk activation, keeping head, neck, and trunk in midline. also had patient practice deep breathing techniques and coughing, including using flutter valve while seated. productive cough was noted after return to bed Balance Overall balance assessment: Needs assistance Sitting-balance support: Feet supported, Bilateral upper extremity supported Sitting balance-Leahy Scale: Poor Sitting balance - Comments: minimal protective righting reactions noted, although patient is aware when he is not in midline. significant weakness throughout and external support required to maintain mildine sitting balance, at least moderate assistance. emphasis on trunk activation, keeping head, neck, and trunk in midline. also had patient practice deep breathing techniques and coughing, including using flutter valve while seated. productive cough was noted after return to bed Postural control:  (loss of balance in all directions, primarily posteriorly)     Special needs/care consideration Radiation simulation date TBD at [facility] and to follow up 10 radiation treatments after Labor day. Dates and times TBD ( [facility] transport has not been arranged as dates and times are being switched form Cancer center at [facility] to [place])                                                               Dr Philis Pique will consult once in [facility] with simulation likely 8/28 or 8/29. Latex 16 fr urethral catheter placed 10/12/21   Previous Home Environment  Living Arrangements: Spouse/significant other  Lives With: Spouse Available Help at Discharge: Family, Available 24 hours/day (wife works remote for [employer]) Type of Home: [employer] Layout: One level Home Access: Stairs to enter Entrance Stairs-Rails: Horticulturist, commercial of Steps: 3 Bathroom Shower/Tub: Chiropodist: Standard Bathroom Accessibility: Yes How Accessible: Accessible via walker [place]: No   Discharge Living Setting Plans for Discharge Living Setting: Patient's home, Lives with (comment) (spouse) Type of Home at Discharge: House Discharge Home Layout: One level Discharge Home Access: Stairs to enter Entrance Stairs-Rails: Left Entrance Stairs-Number of Steps: 3 Discharge Bathroom Shower/Tub: Tub/shower unit Discharge Bathroom Toilet: Standard Discharge Bathroom Accessibility: Yes How Accessible: Accessible via walker Does the patient have any problems obtaining your medications?: No   Social/Family/Support Systems Patient Roles: Spouse (employee) Contact Information: wife, Psychologist, sport and exercise Anticipated Caregiver: wife and friends (They are originally from [place] so all family is out of state) Anticipated Ambulance person Information: see contacts Ability/Limitations of Caregiver: wife works remote from home; petite stature Caregiver Availability: 24/7 Discharge Plan Discussed with Primary Caregiver: Yes Is Caregiver In Agreement with Plan?: Yes Does Caregiver/Family have Issues with Lodging/Transportation while Pt is in Rehab?: No   Wife's brother in law W/c level with lift after CVA a couple of years ago. He is very active and they feel they can manage patient at [place] level also.  Wife's close friend, Lilia Pro, very involved in  assisting them with decisions moving forward. Bonnita Nasuti was former Chief Strategy Officer at [facility])   Goals Patient/Family Goal for Rehab: mod assist with PT and  OT at wheelchair level, supervision with [name] Expected length of stay: ELOS 3 to 4 weeks Pt/Family Agrees to Admission and willing to participate: Yes Program Orientation Provided & Reviewed with Pt/Caregiver Including Roles  & Responsibilities: Yes   Decrease burden of Care through IP rehab admission: n/a   Possible need for SNF placement upon discharge: if does not reach level to discharge home.   Patient Condition: I have reviewed medical records from [facility], spoken with CM, and patient and spouse. I met with patient at the bedside for inpatient rehabilitation assessment.  Patient will benefit from ongoing PT, OT, and [name], can actively participate in 3 hours of therapy a day 5 days of the week, and can make measurable gains during the admission.  Patient will also benefit from the coordinated team approach during an Inpatient Acute Rehabilitation admission.  The patient will receive intensive therapy as well as Rehabilitation physician, nursing, social worker, and care management interventions.  Due to bladder management, bowel management, safety, skin/wound care, disease management, medication administration, pain management, and patient education the patient requires 24 hour a day rehabilitation nursing.  The patient is currently total assist at bed level with mobility and basic ADLs.  Discharge setting and therapy post discharge at home with home health is anticipated.  Patient has agreed to participate in the Acute Inpatient Rehabilitation Program and will admit today.   Preadmission Screen Completed By:  Cleatrice Burke, 10/17/2021 11:01 AM ______________________________________________________________________   Discussed status with Dr. Dagoberto Ligas on 10/17/21 at 61 and received approval for admission today.   Admission Coordinator:  Cleatrice Burke, RN, time 1100 Date 10/17/21    Assessment/Plan: Diagnosis: Does the need for close, 24 hr/day Medical supervision in concert with the patient's rehab needs make it unreasonable for this patient to be served in a less intensive setting? 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 Yes Does the patient require coordinated care of a physician, rehab nurse, PT, OT, and [name] to address physical and functional deficits in the context of the above medical diagnosis(es)? Yes Addressing deficits in the following areas: balance, endurance, locomotion, strength, transferring, bowel/bladder control, bathing, dressing, feeding, grooming, toileting, cognition, speech, language, and swallowing Can the patient actively participate in an intensive therapy program of at least 3 hrs of therapy 5 days a week? Yes The potential for patient to make measurable gains while on inpatient rehab is good and fair Anticipated functional outcomes upon discharge from inpatient rehab: min assist and mod assist PT, min assist and mod assist OT, supervision [name] Estimated rehab length of stay to reach the above functional goals is: 3-4 weeks Anticipated discharge destination: Home 10. Overall Rehab/Functional Prognosis: good and fair     MD Signature:           Revision History                           Note Details  Jan Fireman, MD File Time 10/17/2021 11:59 AM  Author Type Physician Status Signed  Last Editor Courtney Heys, MD Service Physical Medicine and [place] # [id number] Admit Date 10/17/2021

## 2021-10-17 NOTE — Telephone Encounter (Signed)
-----   Message from Earlie Server, MD sent at 10/17/2021  7:39 AM EDT ----- He is about to be discharged in a few days.  Metastatic prostate cancer.  Will need outpatient PMSA PET scan ASAP, also lab MD+ Eligard around 9/13, please order cbc cmp PSA.

## 2021-10-17 NOTE — Plan of Care (Signed)
Patient to admit to rehab today

## 2021-10-17 NOTE — Telephone Encounter (Signed)
Hey can we please schedule and inform patient of upcoming appointments. All orders are in. THANK YOU.

## 2021-10-17 NOTE — Progress Notes (Addendum)
[name] F/UNote  Patient Details Name: Jason [name] MRN: [id number] DOB: [birth year]   Cancelled treatment:       Reason Eval/Treat Not Completed:  (chart reviewed; consulted NSG and pt) Pt denied any difficulty swallowing w/ the current diet; Wife preps and feeds pt d/t UE weakness. Discussed the need for Upright positioning w/ ALL oral intake; pt agreed verbally. Encouraged following general aspiration precautions as he stated he has been during oral intake/meals. NSG reported no difficulty swallowing as well.  Per MD note today: "LUNGS: Normal breath sounds bilaterally, no wheezing, rales, rhonchi.". WBC declining; O2: 2-2.5L.  No further acute, skilled ST services indicated at this time. Pt agreed. NSG to resconsult if any new issues during admit.      [name], MS, CCC-[name] Speech Language Pathologist Rehab Services; [place] [phone] (ascom) Halimah Bewick 10/17/2021, 11:39 AM

## 2021-10-17 NOTE — Progress Notes (Signed)
  Inpatient Rehabilitation Admissions Coordinator   I have received insurance approval to admit to [facility] CIR today. Dr Posey Pronto has given medical clearance to admit today. Acute team and TOC made aware. I spoke with wife and they are in agreement to admit today. He will be admitted to [facility] with Dr Dagoberto Ligas admitting MD. I have contacted [facility] for transport pickup at 1330.  Danne Baxter, RN, MSN Rehab Admissions Coordinator [phone] 10/17/2021 11:53 AM

## 2021-10-17 NOTE — Progress Notes (Signed)
Brief Holding Note  Evaluated patient with wife at bedside on admission to [facility]. Patient was comfortable and denies acute concerns after transfer from [facility].  +BM 8/24.   ROS: Denies fevers, chills, N/V, abdominal pain, constipation, diarrhea, SOB, chest pain.  + Mild productive cough, improving      10/17/2021    8:04 PM 10/17/2021    4:52 PM 10/17/2021    1:57 PM  Vitals with BMI  Systolic [phone]  Diastolic 73 74 72  Pulse 87 88 90    Vitals reviewed as above. Constitutional: No apparent distress. Appropriate appearance for age.  HENT: Atraumatic, normocephalic. Eyes: PERRLA. EOMI.  Cardiovascular: RRR, no murmurs/rub/gallops. No Edema. Peripheral pulses 2+  Respiratory: Mild crackles in L upper lobe. Weak cough. +2 L [place]. No distress or accessory muscle use. Abdomen: + bowel sounds, normoactive. No distention or tenderness.  Neuro: AAOx4. Right truncal lean. RLE with intermittent spasms at rest. Skin: C/D/I. No bogginess of heels. +heel floating boots.   A&P: No acute concerns on initial assessment. Patient stable for IPR.    Gertie Gowda, DO 10/17/2021

## 2021-10-17 NOTE — Progress Notes (Signed)
Inpatient Rehabilitation Admission Medication Review by a Pharmacist  A complete drug regimen review was completed for this patient to identify any potential clinically significant medication issues.  High Risk Drug Classes Is patient taking? Indication by Medication  Antipsychotic Yes Compazine IM/PO - N/V  Anticoagulant Yes Lovenox - DVT px  Antibiotic Yes Augmentin - PNA  Opioid Yes Percocet - pain  Antiplatelet Yes ASA - CVA  Hypoglycemics/insulin No   Vasoactive Medication No   Chemotherapy No   Other Yes Ator - HLD Clonazapem - anxiety Prozac - depression Famotidine - reflux Synthroid - hypothyroidism Mobic - pain Midodrine - hypotension Flomax - BPH Claritin - allergy     Type of Medication Issue Identified Description of Issue Recommendation(s)  Drug Interaction(s) (clinically significant)     Duplicate Therapy     Allergy     No Medication Administration End Date     Incorrect Dose     Additional Drug Therapy Needed  Dc note said to resume spironolactone and lisinopril and dc the midodrine but midodrine ordered to continue. Messaged to resume meds and dc midodrine  Significant med changes from prior encounter (inform family/care partners about these prior to discharge).    Other       Clinically significant medication issues were identified that warrant physician communication and completion of prescribed/recommended actions by midnight of the next day:  Yes  Name of provider notified for urgent issues identified: Algis Liming, PA  Provider Method of Notification: Secure chat    Pharmacist comments:   Time spent performing this drug regimen review (minutes):  25

## 2021-10-17 NOTE — Discharge Summary (Signed)
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 status post C6 cervical laminectomy with resection of epidural spinal tumor acute hypoxic and hypercapnia respiratory failure post intubation sepsis due to aspiration pneumonia improved  Hospital Course:  66 year old WM PMHx essential HTN, unstable angina, PVC, PSVT, NSVT, hypothyroidism, GERD, BPH, prostate cancer metastatic to multiple sites,    Presented with neck/shoulder pain found to have widely metastatic cancer suspicious for prostate He was found to have widely metastatic prostate cancer including calvarial mets and epidural spinal tumor, now s/p emergent C6 cervical laminectomy for resection of the epidural tumor. Surgery was on 8/13. Palliative radiation therapy is planned to cervical spine. Neurology is requested to consult regarding MRI finding on 8/15 of small acute ischemic R parietal infarct (TTE 8/15 showed no e/o intracardiac clot but did not include bubble study -s/p emergent C6 cervical laminectomy with resection of epidural spinal tumor   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 worsening shock despite fluids and post op resp failure now extubation, high risk for aspiration  Extensive metastasis epidural space, spine, calvarium, pelvis- with significant neurological deficits,  -8/13 s/p cervical tumor debulking and canal decompression -Per PCCM note patient to follow with oncology and radiation oncology. - when more stable officially consult radiation  oncology --Per [name] pt is s/p Firmagon--next dose 9/13 --check LFT's--trending down -- discussed with Dr. Tasia Catchings, Dr. Baruch Gouty radiation oncology-since patient is going to CIR at [facility] patient will be followed up with Dr. Tyler Pita at [address] [facility] for further treatment.   Severe ACUTE Hypoxic and Hypercapnic Respiratory Failure -Extubated -High risk for aspiration and re-intubation -Chest PT okay by neuro -Incentive spirometry - Flutter valve - DuoNeb qid prn --weaned to  2 L [place]. Sats stable   - Sepsis pneumonia/Aspiration pneumonia -8/19 PCXR increase bibasilar atelectasis possible RLL aspiration -Cont IV unasyn--will change to augmentin for 5 more days --wbc 12K --low grade fever  --speech evaluation appreciated. No overt signs of aspiration noted. Continue aspiration precaution.   Hypotension--improved --d/c midodrine  HTN --resumed low dose BB (25 mg qd) and lisinopril (5 mg qd) since BP trending up -- adjust medication according to BP   Shock -Most likely secondary to extensive metastatic disease -Vasopressors discontinued - Resolved   RIGHT parietal stroke - Neurology consulted -Posterior cervical incision management and MAP goal> 65 per NSGY -Wean off dexamethasone to promote wound healing. --recommends ASA to be started (hgb 7.6) -- continue statins   Anemia unspecified/IDA -8/20 discussed case with Dr. Su Monks neurology given patient's anemia will hold off on starting aspirin until at least 8/21 to ensure hemoglobin stable --8/21--iron studies  abnormal.received IV iron (ferraheme) x1 --no active bleeding reported -  transfuse for hemoglobin<7 --d/w pt and wife ok to take IV iron (ferraheme) and po iron   Anxiety - Clonazepam 0.5 mg BID - Prozac 20 mg daily   Overall slowly moving well.  d/w NS and oncology regarding d/c planning to CIR and in agreement.     Procedures:as above Family communication :  wife at bedside Consults  : neurosurgery, neurology, PCCM CODE STATUS:  FULL DVT Prophylaxis :enoxaparin     Pain control - [place] Controlled Substance Reporting System database was reviewed. and patient was instructed, not to drive, operate heavy machinery, perform activities at heights, swimming or participation in water activities or provide baby-sitting services while on Pain, Sleep and Anxiety Medications; until their outpatient Physician has advised to do so again. Also recommended to not to take more than prescribed Pain, Sleep and Anxiety Medications.   Disposition:  CIR at [facility] Diet recommendation:  Discharge Diet Orders (From admission, onward)     Start     Ordered   10/17/21 0000  Diet - low sodium heart healthy        10/17/21 1136           Cardiac diet DISCHARGE MEDICATION: Allergies as of 10/17/2021   No Known Allergies      Medication List     STOP taking these medications    amoxicillin 500 MG capsule Commonly known as: AMOXIL   Icy Hot 5 % Ptch Generic drug: Menthol (Topical Analgesic)   rosuvastatin 5 MG tablet Commonly known as: CRESTOR   sildenafil 20 MG tablet Commonly known as: REVATIO   torsemide 10 MG tablet Commonly known as: DEMADEX       TAKE these medications    acetaminophen 500 MG tablet Commonly known as: TYLENOL Take 1,000 mg by mouth in the morning, at noon, and at bedtime.   amoxicillin-clavulanate 875-125 MG tablet Commonly known as: AUGMENTIN Take 1 tablet by mouth every 12 (twelve) hours for 5 days.   aspirin EC 81 MG tablet Take 1 tablet (81 mg total) by mouth daily. Swallow whole. Start taking on: October 18, 2021   atorvastatin 40 MG tablet Commonly known as: LIPITOR Take 1 tablet (40 mg total) by mouth daily. Start taking on: October 18, 2021   cetirizine 10 MG tablet Commonly known as: ZYRTEC Take 10 mg by mouth at bedtime.   clonazePAM 0.5 MG tablet Commonly known as: KLONOPIN Take 1 tablet (0.5 mg total) by mouth 2  (two) times daily.   CRANBERRY PO Take 500 mg by mouth at bedtime.   cyclobenzaprine 10 MG tablet Commonly known as: FLEXERIL TAKE 1 TABLET BY MOUTH EVERY DAY AT NIGHT AS NEEDED What changed: See the new instructions.   docusate sodium 100 MG capsule Commonly known as: COLACE Take 1 capsule (100 mg total) by mouth 2 (two) times daily.   FLUoxetine 20 MG capsule Commonly known as: PROZAC Take 1 capsule (20 mg total) by mouth at bedtime.   iron polysaccharides 150 MG capsule Commonly known as: NIFEREX Take 1 capsule (150 mg total) by mouth daily. Start taking on: October 18, 2021   levothyroxine 88 MCG tablet Commonly known as: SYNTHROID TAKE 1 TABLET BY MOUTH EVERY DAY What changed: when to take this   lisinopril 5 MG tablet Commonly known as: ZESTRIL Take 1 tablet (5 mg total) by mouth daily. What changed:  medication strength how much to take   loratadine 10 MG tablet Commonly known as: CLARITIN Take 10 mg by mouth in the morning and at bedtime.   meloxicam 15 MG tablet Commonly known as: MOBIC Take 1 tablet (15 mg total) by mouth daily.   metoprolol succinate 25 MG 24 hr tablet Commonly known as: TOPROL-XL Take 1 tablet (25 mg total) by mouth daily. Take with or immediately following a meal. What changed:  medication strength  See the new instructions.   multivitamin tablet Take 1 tablet by mouth daily.   oxyCODONE-acetaminophen 5-325 MG tablet Commonly known as: PERCOCET/ROXICET Take 1 tablet by mouth every 4 (four) hours as needed for severe pain.   potassium chloride 10 MEQ tablet Commonly known as: KLOR-CON TAKE 1 TABLET BY MOUTH EVERY DAY What changed: when to take this   sodium chloride 0.65 % Soln nasal spray Commonly known as: OCEAN Place 1 spray into both nostrils as needed for congestion.   spironolactone 25 MG tablet Commonly known as: ALDACTONE TAKE 1/2 TABLET BY MOUTH EVERY DAY   tamsulosin 0.4 MG Caps capsule Commonly known as:  FLOMAX TAKE 1 CAPSULE BY MOUTH EVERY DAY   VITA-C PO Take 1,000 mg by mouth daily.               Discharge Care Instructions  (From admission, onward)           Start     Ordered   10/17/21 0000  Discharge wound care:       Comments: Per Dr Marisa Severin neck staples on Sunday 10/20/2021   10/17/21 1136            Follow-up Information     Venia Carbon, MD. Schedule an appointment as soon as possible for a visit in 2 week(s).   Specialties: Internal Medicine, Pediatrics Contact information: [place] [address] [phone]         Nelva Bush, MD .   Specialty: Cardiology Contact information: [place] [address] [phone]         Meade Maw, MD. Go to.   Specialty: Neurosurgery Why: on your scheduled appt Contact information: [address] [place] [address] [phone]                Discharge Exam: Danley Danker Weights   10/15/21 0430 10/16/21 0300 10/17/21 0500  Weight: 101.1 kg 103.6 kg 99.8 kg     Condition at discharge: fair  The results of significant diagnostics from this hospitalization (including imaging, microbiology, ancillary and laboratory) are listed below for reference.   Imaging Studies: [place] EKG SITE RITE  Result Date: 10/13/2021 If Site Rite image not attached, placement could not be confirmed due to current cardiac rhythm.  DG Chest Port 1 View  Result Date: 10/12/2021 CLINICAL DATA:  Sepsis, fever EXAM: PORTABLE CHEST 1 VIEW COMPARISON:  10/11/2021 FINDINGS: Lung volumes are small with asymmetric right-sided volume loss, unchanged from prior examination. Bibasilar atelectasis noted. No pneumothorax or pleural effusion. Cardiac size is within normal limits. Left internal jugular central venous catheter tip noted at the superior cavoatrial junction. The thoracic aorta is ectatic, similar to prior examination and likely  accentuated by poor pulmonary insufflation. Pulmonary vascularity is normal. Left total shoulder arthroplasty has been performed. IMPRESSION: 1. Pulmonary hypoinflation. 2. Bibasilar atelectasis. Electronically Signed   By: Fidela Salisbury M.D.   On: 10/12/2021 20:08   DG Abd 1 View  Result Date: 10/12/2021 CLINICAL DATA:  Constipation EXAM: ABDOMEN - 1 VIEW COMPARISON:  10/07/2021 FINDINGS: No bowel dilatation to suggest obstruction. No evidence of pneumoperitoneum, portal venous gas or pneumatosis. No pathologic calcifications along the expected course of the ureters. No acute osseous abnormality. IMPRESSION: Negative. Electronically Signed   By: Kathreen Devoid M.D.   On: 10/12/2021 17:49   US Venous Img Lower Bilateral (DVT)  Result Date: 10/11/2021 CLINICAL DATA:  CVA. EXAM: BILATERAL LOWER EXTREMITY VENOUS  DOPPLER ULTRASOUND TECHNIQUE: Gray-scale sonography with graded compression, as well as color Doppler and duplex ultrasound were performed to evaluate the lower extremity deep venous systems from the level of the common femoral vein and including the common femoral, femoral, profunda femoral, popliteal and calf veins including the posterior tibial, peroneal and gastrocnemius veins when visible. The superficial great saphenous vein was also interrogated. Spectral Doppler was utilized to evaluate flow at rest and with distal augmentation maneuvers in the common femoral, femoral and popliteal veins. COMPARISON:  None Available. FINDINGS: RIGHT LOWER EXTREMITY Common Femoral Vein: No evidence of thrombus. Normal compressibility, respiratory phasicity and response to augmentation. Saphenofemoral Junction: No evidence of thrombus. Normal compressibility and flow on color Doppler imaging. Profunda Femoral Vein: No evidence of thrombus. Normal compressibility and flow on color Doppler imaging. Femoral Vein: No evidence of thrombus. Normal compressibility, respiratory phasicity and response to augmentation.  Popliteal Vein: No evidence of thrombus. Normal compressibility, respiratory phasicity and response to augmentation. Calf Veins: No evidence of thrombus. Normal compressibility and flow on color Doppler imaging. Superficial Great Saphenous Vein: No evidence of thrombus. Normal compressibility. Venous Reflux:  None. Other Findings: No evidence of superficial thrombophlebitis or abnormal fluid collection. LEFT LOWER EXTREMITY Common Femoral Vein: No evidence of thrombus. Normal compressibility, respiratory phasicity and response to augmentation. Saphenofemoral Junction: No evidence of thrombus. Normal compressibility and flow on color Doppler imaging. Profunda Femoral Vein: No evidence of thrombus. Normal compressibility and flow on color Doppler imaging. Femoral Vein: No evidence of thrombus. Normal compressibility, respiratory phasicity and response to augmentation. Popliteal Vein: No evidence of thrombus. Normal compressibility, respiratory phasicity and response to augmentation. Calf Veins: No evidence of thrombus. Normal compressibility and flow on color Doppler imaging. Superficial Great Saphenous Vein: No evidence of thrombus. Normal compressibility. Venous Reflux:  None. Other Findings: No evidence of superficial thrombophlebitis or abnormal fluid collection. IMPRESSION: No evidence of deep venous thrombosis in either lower extremity. Electronically Signed   By: Aletta Edouard M.D.   On: 10/11/2021 16:57   ECHOCARDIOGRAM LIMITED BUBBLE STUDY  Result Date: 10/11/2021    ECHOCARDIOGRAM LIMITED REPORT   Patient Name:   Jason [name] Date of Exam: 10/11/2021 Medical Rec #:  [id number]        Height:       69.0 in Accession #:    [id number]       Weight:       201.3 lb Date of Birth:  07/22/1955        BSA:          2.072 m Patient Age:    45 years         BP:           140/75 mmHg Patient Gender: M                HR:           103 bpm. Exam Location:  [facility] Procedure: Limited Echo and Saline Contrast Bubble  Study Indications:     I63.9 Stroke  History:         Patient has prior history of Echocardiogram examinations, most                  recent 10/08/2021. Risk Factors:Hypertension.  Sonographer:     Charmayne Sheer Referring Phys:  [place] Diagnosing Phys: Ida Rogue MD IMPRESSIONS  1. Left ventricular ejection fraction, by estimation, is 60 to 65%. The left ventricle has normal function. The left ventricle  has no regional wall motion abnormalities. Left ventricular diastolic parameters are indeterminate.  2. Right ventricular systolic function is normal. The right ventricular size is normal  3. Agitated saline contrast bubble study was negative, with no evidence of any interatrial shun  4. Limited study FINDINGS  Left Ventricle: Left ventricular ejection fraction, by estimation, is 60 to 65%. The left ventricle has normal function. The left ventricle has no regional wall motion abnormalities. The left ventricular internal cavity size was normal in size. There is  no left ventricular hypertrophy. Left ventricular diastolic parameters are indeterminate. Right Ventricle: The right ventricular size is normal. No increase in right ventricular wall thickness. Right ventricular systolic function is normal. Left Atrium: Left atrial size was normal in size. Right Atrium: Right atrial size was normal in size. Pericardium: There is no evidence of pericardial effusion. Mitral Valve: The mitral valve was not well visualized. No evidence of mitral valve stenosis. Tricuspid Valve: The tricuspid valve is not well visualized. Tricuspid valve regurgitation is not demonstrated. No evidence of tricuspid stenosis. Aortic Valve: The aortic valve was not well visualized. Aortic valve regurgitation is not visualized. No aortic stenosis is present. Pulmonic Valve: The pulmonic valve was not well visualized. Pulmonic valve regurgitation is not visualized. No evidence of pulmonic stenosis. Aorta: The aortic root was not well  visualized. Venous: The pulmonary veins were not well visualized. The inferior vena cava was not well visualized. IAS/Shunts: No atrial level shunt detected by color flow Doppler. Agitated saline contrast was given intravenously to evaluate for intracardiac shunting. Agitated saline contrast bubble study was negative, with no evidence of any interatrial shunt. Ida Rogue MD Electronically signed by Ida Rogue MD Signature Date/Time: 10/11/2021/11:41:14 AM    Final    DG Chest Port 1 View  Result Date: 10/11/2021 CLINICAL DATA:  Respiratory failure.  Metastatic prostate cancer. EXAM: PORTABLE CHEST 1 VIEW COMPARISON:  Chest x-ray dated 10/09/2021. FINDINGS: Cardiomediastinal silhouette is stable in size and configuration. LEFT-sided PICC line is stable in position with tip at the level of the lower SVC/cavoatrial junction. Study is hypoinspiratory with crowding of the bibasilar markings, RIGHT greater than LEFT. Lungs appear clear. No pleural effusion or pneumothorax is seen. IMPRESSION: Low lung volumes. No evidence of pneumonia or pulmonary edema. Electronically Signed   By: Franki Cabot M.D.   On: 10/11/2021 08:09   CT ANGIO HEAD NECK W WO CM  Result Date: 10/10/2021 CLINICAL DATA:  Left hand paresthesia.  Metastatic prostate cancer. EXAM: CT ANGIOGRAPHY HEAD AND NECK TECHNIQUE: Multidetector CT imaging of the head and neck was performed using the standard protocol during bolus administration of intravenous contrast. Multiplanar CT image reconstructions and MIPs were obtained to evaluate the vascular anatomy. Carotid stenosis measurements (when applicable) are obtained utilizing NASCET criteria, using the distal internal carotid diameter as the denominator. RADIATION DOSE REDUCTION: This exam was performed according to the departmental dose-optimization program which includes automated exposure control, adjustment of the mA and/or kV according to patient size and/or use of iterative reconstruction  technique. CONTRAST:  10m OMNIPAQUE IOHEXOL 350 MG/ML SOLN COMPARISON:  None Available. FINDINGS: CT HEAD FINDINGS Brain: There is no mass, hemorrhage or extra-axial collection. The size and configuration of the ventricles and extra-axial CSF spaces are normal. There is no acute or chronic infarction. The brain parenchyma is normal. Skull: The visualized skull base, calvarium and extracranial soft tissues are normal. Sinuses/Orbits: No fluid levels or advanced mucosal thickening of the visualized paranasal sinuses. No mastoid or middle ear  effusion. The orbits are normal. CTA NECK FINDINGS SKELETON: Numerous sclerotic lesions throughout the cervical spine. OTHER NECK: Normal pharynx, larynx and major salivary glands. No cervical lymphadenopathy. Unremarkable thyroid gland. UPPER CHEST: Right nodal lobe atelectasis and hazy opacities in the right upper lobe. AORTIC ARCH: There is calcific atherosclerosis of the aortic arch. There is no aneurysm, dissection or hemodynamically significant stenosis of the visualized portion of the aorta. Conventional 3 vessel aortic branching pattern. The visualized proximal subclavian arteries are widely patent. RIGHT CAROTID SYSTEM: Normal without aneurysm, dissection or stenosis. LEFT CAROTID SYSTEM: Normal without aneurysm, dissection or stenosis. VERTEBRAL ARTERIES: Right dominant configuration. Both origins are clearly patent. There is no dissection, occlusion or flow-limiting stenosis to the skull base (V1-V3 segments). CTA HEAD FINDINGS POSTERIOR CIRCULATION: --Vertebral arteries: Normal V4 segments. --Inferior cerebellar arteries: Normal. --Basilar artery: Normal. --Superior cerebellar arteries: Normal. --Posterior cerebral arteries (PCA): Normal. ANTERIOR CIRCULATION: --Intracranial internal carotid arteries: Normal. --Anterior cerebral arteries (ACA): Normal. Both A1 segments are present. Patent anterior communicating artery (a-comm). --Middle cerebral arteries (MCA):  Normal. VENOUS SINUSES: As permitted by contrast timing, patent. ANATOMIC VARIANTS: None Review of the MIP images confirms the above findings. Review of the MIP images confirms the above findings IMPRESSION: 1. No emergent large vessel occlusion, hemodynamically significant stenosis, aneurysm or dissection of the major cervical or intracranial arteries. 2. Numerous sclerotic lesions throughout the cervical spine, consistent with osseous metastatic disease. 3. Right middle lobe atelectasis and hazy opacities in the right upper lobe, possibly infection or asymmetric pulmonary edema. 4. Aortic Atherosclerosis (ICD10-I70.0). Electronically Signed   By: Ulyses Jarred M.D.   On: 10/10/2021 21:55   DG Chest Port 1 View  Result Date: 10/09/2021 CLINICAL DATA:  Respiratory failure EXAM: PORTABLE CHEST 1 VIEW COMPARISON:  Film from the previous day. FINDINGS: Cardiac shadow is stable. Endotracheal tube and gastric catheter have been removed in the interval. Left jugular central line is again seen. Lungs are clear. No bony abnormality is noted. IMPRESSION: No acute abnormality noted. Electronically Signed   By: Inez Catalina M.D.   On: 10/09/2021 21:41   MR BRAIN W WO CONTRAST  Result Date: 10/08/2021 CLINICAL DATA:  Extensive metastatic disease with confusion and lethargy. EXAM: MRI HEAD WITHOUT AND WITH CONTRAST TECHNIQUE: Multiplanar, multiecho pulse sequences of the brain and surrounding structures were obtained without and with intravenous contrast. CONTRAST:  67m GADAVIST GADOBUTROL 1 MMOL/ML IV SOLN COMPARISON:  None Available. FINDINGS: Brain: There is a small focus of diffusion restriction in the right parietal cortex with faint corresponding FLAIR signal abnormality but no abnormal enhancement most in keeping with a small acute infarct. There is no associated hemorrhage or mass effect. There is no other evidence of acute infarct. There is no acute intracranial hemorrhage or extra-axial fluid collection.  Background parenchymal volume is normal. The ventricles are normal in size. Gray-white differentiation is preserved. Scattered small foci of FLAIR signal abnormality in the subcortical and periventricular white matter are nonspecific but likely reflects sequela of mild chronic white matter microangiopathy There is no abnormal parenchymal enhancement there is no solid parenchymal lesion. There is calvarial signal abnormality bilaterally likely reflecting osseous metastatic disease. There is intracranial enhancement along the right frontal convexity likely reflecting extraosseous tumor measuring up to 5 mm in thickness. There is additional thin pachymeningeal thickening and enhancement over the right cerebral convexity suspicious for additional tumor extension. There is extracranial extension of tumor into the soft tissues of the overlying scalp measuring up to 3 mm in thickness.  Additional extracranial tumor is seen along the scalp at the vertex measuring up to 7 mm in thickness. There is no mass effect on the underlying brain parenchyma due to the intracranial tumor. There is no midline shift. Vascular: Normal flow voids. Skull and upper cervical spine: Osseous metastatic disease is seen in the calvarium with extraosseous extension as described above. Sinuses/Orbits: There is extensive fluid in the paranasal sinuses likely related to instrumentation. The globes and orbits are unremarkable. Other: None. IMPRESSION: 1. 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 There is intracranial extension of tumor along the right frontal convexity measuring up to 5 mm in thickness without mass effect on the underlying brain parenchyma or midline shift. Extracranial extension of tumor in this location measures up to approximately 3 mm. 2. Additional extracranial extension of tumor into the scalp along the vertex measuring up to 7 mm in thickness. 3. Small focus of diffusion restriction  in the right parietal cortex most in keeping with a small acute infarct. 4. No evidence of parenchymal metastatic disease. Electronically Signed   By: Valetta Mole M.D.   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 Postop day 1 with worsening shock, confusion, lethargy. EXAM: MRI THORACIC AND LUMBAR SPINE WITHOUT AND WITH CONTRAST TECHNIQUE: Multiplanar and multiecho pulse sequences of the thoracic and lumbar spine were obtained without and with intravenous contrast. CONTRAST:  40m GADAVIST GADOBUTROL 1 MMOL/ML IV SOLN COMPARISON:  Cervical spine MRI dated 1 day prior, CT chest/abdomen/pelvis 10/05/2021 FINDINGS: MRI THORACIC SPINE FINDINGS A single sagittal T2 sequence through the C-spine was obtained. Postsurgical changes reflecting C6 laminectomy are noted. The spinal canal patency is unchanged compared to the postoperative study from 1 day prior. There is no definite cord signal abnormality. There is prevertebral edema which may be postsurgical in nature. Alignment: There is exaggerated thoracic kyphosis. There is no antero or retrolisthesis. Vertebrae: There is extensive signal abnormality throughout the thoracic spine consistent with extensive osseous metastatic disease. There is no evidence of pathologic fracture. There is breakthrough of the posterior cortex with epidural tumor along the posterior endplates at T9, TJ88 and T12 without no significant spinal canal stenosis or evidence of cord compression. Circumferential enhancement is also seen in the spinal canal at the cervicothoracic junction extending superiorly off the field of view and inferiorly to the T3-T4 level likely reflecting epidural tumor also without high-grade spinal canal stenosis or cord  compression (for example 27-2, 27-6). Cord: Normal in signal and morphology. There is no abnormal cord enhancement. There is scallop shaped T1 isointense material in the dorsal canal along the cord beginning at the T9-T10 level extending inferiorly into the lumbar spine which anteriorly displaces the cord and cauda equina nerve roots, likely subdural in location of uncertain etiology. The collection measures up to 7 mm in maximal thickness in the axial plane (see image 24-33, 20-9). There is no peripheral enhancement to suggest abscess. Paraspinal and other soft tissues: There is consolidation in the lower lobes, right worse than left, new since the prior CT from 08/12. The paraspinal soft tissues are unremarkable. Disc levels: There is overall mild background degenerative change throughout the thoracic spine. There is no significant disc herniation or spinal canal or neural foraminal stenosis. MRI  LUMBAR SPINE FINDINGS Segmentation: Standard; the lowest formed disc space is designated L5-S1. Alignment:  There is no significant antero or retrolisthesis. Vertebrae: There is extensive signal abnormality throughout the lumbar spine and imaged pelvis consistent with extensive osseous metastatic disease, most notably at L1 and L2. There is epidural extension of tumor most notably at L1 where it measures up to 5 mm in thickness and at L2 particularly in the region of the right subarticular zone and along the right lamina (7-14); however, circumferential enhancement extends inferiorly throughout the lumbar spine into the sacrum likely reflecting additional epidural tumor. The epidural tumor at L1 results in mild canal narrowing without cord/conus compression. The epidural tumor lower in the lumbar spine superimposed on pre-existing degenerative changes contribute to multilevel spinal canal stenosis as described in detail below. There is no evidence of pathologic fracture. Conus medullaris: Extends to the T12-L1 level.  There is crowding of the cauda equina nerve roots due to the above-described presumed subdural fluid collection. There is no abnormal enhancement of the cauda equina nerve roots. Paraspinal and other soft tissues: The paraspinal soft tissues are unremarkable. Disc levels: T12-L1: No significant spinal canal or neural foraminal stenosis L1-L2: Disc degeneration without significant spinal canal or neural foraminal stenosis L2-L3: Disc degeneration and bilateral facet arthropathy result in mild bilateral subarticular zone narrowing without significant neural foraminal stenosis. L3-L4: There is diffuse disc bulge and moderate bilateral facet arthropathy with ligamentum flavum thickening as well as probable epidural tumor resulting in severe spinal canal stenosis with cauda equina nerve root compression and moderate left and mild right neural foraminal stenosis L4-L5: There is diffuse disc bulge and moderate bilateral facet arthropathy as well as probable epidural tumor most notably along the dorsal canal resulting in severe spinal canal stenosis with cauda equina nerve root compression and moderate left and mild right neural foraminal stenosis L5-S1: There is a diffuse disc bulge and bilateral facet arthropathy as well as epidural tumor along the dorsal and ventral canal resulting in moderate spinal canal stenosis with bilateral subarticular zone narrowing and moderate left worse than right neural foraminal stenosis. IMPRESSION: 1. 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 2. 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 3. Additional epidural tumor along the posterior endplates at T9, Q75, and T12 without high-grade spinal canal stenosis or cord compression. 4. Extensive epidural tumor in the lumbar spine is greatest in  thickness at L1 without high-grade spinal canal stenosis at this level; however, superimposed on pre-existing degenerative changes lower in the lumbar spine results in severe spinal canal stenosis with cauda equina nerve root compression at L3-L4 and L4-L5. Multilevel neural foraminal stenosis is detailed above. 5. Postsurgical changes reflecting C6 laminectomy unchanged spinal canal stenosis compared to the postoperative study from 1 day prior. No definite cord signal abnormality. 6. Extramedullary fluid collection along the dorsal aspect of the cord beginning at T9-T10 extending into the lumbar spine is likely subdural in location but is of uncertain etiology; evolving blood products not excluded. The collection anteriorly displaces the cord and cauda equina nerve roots without frank cord compression or signal abnormality. There is no peripheral enhancement to suggest abscess. 7. Prevertebral edema in the cervical spine which may be postsurgical in nature. 8. New bilateral lower lobe consolidations may reflect atelectasis or developing pneumonia/aspiration. Electronically Signed   By: Court Joy.D.  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 Postop day 1 with worsening shock, confusion, lethargy. EXAM: MRI THORACIC AND LUMBAR SPINE WITHOUT AND WITH CONTRAST TECHNIQUE: Multiplanar and multiecho pulse sequences of the thoracic and lumbar spine were obtained without and with intravenous contrast. CONTRAST:  48m GADAVIST GADOBUTROL 1 MMOL/ML IV SOLN COMPARISON:  Cervical spine MRI dated 1 day prior, CT chest/abdomen/pelvis 10/05/2021 FINDINGS: MRI THORACIC SPINE FINDINGS A single sagittal T2 sequence through the C-spine was obtained. Postsurgical changes reflecting C6  laminectomy are noted. The spinal canal patency is unchanged compared to the postoperative study from 1 day prior. There is no definite cord signal abnormality. There is prevertebral edema which may be postsurgical in nature. Alignment: There is exaggerated thoracic kyphosis. There is no antero or retrolisthesis. Vertebrae: There is extensive signal abnormality throughout the thoracic spine consistent with extensive osseous metastatic disease. There is no evidence of pathologic fracture. There is breakthrough of the posterior cortex with epidural tumor along the posterior endplates at T9, TY56 and T12 without no significant spinal canal stenosis or evidence of cord compression. Circumferential enhancement is also seen in the spinal canal at the cervicothoracic junction extending superiorly off the field of view and inferiorly to the T3-T4 level likely reflecting epidural tumor also without high-grade spinal canal stenosis or cord compression (for example 27-2, 27-6). Cord: Normal in signal and morphology. There is no abnormal cord enhancement. There is scallop shaped T1 isointense material in the dorsal canal along the cord beginning at the T9-T10 level extending inferiorly into the lumbar spine which anteriorly displaces the cord and cauda equina nerve roots, likely subdural in location of uncertain etiology. The collection measures up to 7 mm in maximal thickness in the axial plane (see image 24-33, 20-9). There is no peripheral enhancement to suggest abscess. Paraspinal and other soft tissues: There is consolidation in the lower lobes, right worse than left, new since the prior CT from 08/12. The paraspinal soft tissues are unremarkable. Disc levels: There is overall mild background degenerative change throughout the thoracic spine. There is no significant disc herniation or spinal canal or neural foraminal stenosis. MRI LUMBAR SPINE FINDINGS Segmentation: Standard; the lowest formed disc space is designated  L5-S1. Alignment:  There is no significant antero or retrolisthesis. Vertebrae: There is extensive signal abnormality throughout the lumbar spine and imaged pelvis consistent with extensive osseous metastatic disease, most notably at L1 and L2. There is epidural extension of tumor most notably at L1 where it measures up to 5 mm in thickness and at L2 particularly in the region of the right subarticular zone and along the right lamina (7-14); however, circumferential enhancement extends inferiorly throughout the lumbar spine into the sacrum likely reflecting additional epidural tumor. The epidural tumor at L1 results in mild canal narrowing without cord/conus compression. The epidural tumor lower in the lumbar spine superimposed on pre-existing degenerative changes contribute to multilevel spinal canal stenosis as described in detail below. There is no evidence of pathologic fracture. Conus medullaris: Extends to the T12-L1 level. There is crowding of the cauda equina nerve roots due to the above-described presumed subdural fluid collection. There is no abnormal enhancement of the cauda equina nerve roots. Paraspinal and other soft tissues: The paraspinal soft tissues are unremarkable. Disc levels: T12-L1: No significant  spinal canal or neural foraminal stenosis L1-L2: Disc degeneration without significant spinal canal or neural foraminal stenosis L2-L3: Disc degeneration and bilateral facet arthropathy result in mild bilateral subarticular zone narrowing without significant neural foraminal stenosis. L3-L4: There is diffuse disc bulge and moderate bilateral facet arthropathy with ligamentum flavum thickening as well as probable epidural tumor resulting in severe spinal canal stenosis with cauda equina nerve root compression and moderate left and mild right neural foraminal stenosis L4-L5: There is diffuse disc bulge and moderate bilateral facet arthropathy as well as probable epidural tumor most notably along the  dorsal canal resulting in severe spinal canal stenosis with cauda equina nerve root compression and moderate left and mild right neural foraminal stenosis L5-S1: There is a diffuse disc bulge and bilateral facet arthropathy as well as epidural tumor along the dorsal and ventral canal resulting in moderate spinal canal stenosis with bilateral subarticular zone narrowing and moderate left worse than right neural foraminal stenosis. IMPRESSION: 1. 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 2. 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 3. Additional epidural tumor along the posterior endplates at T9, U44, and T12 without high-grade spinal canal stenosis or cord compression. 4. Extensive epidural tumor in the lumbar spine is greatest in thickness at L1 without high-grade spinal canal stenosis at this level; however, superimposed on pre-existing degenerative changes lower in the lumbar spine results in severe spinal canal stenosis with cauda equina nerve root compression at L3-L4 and L4-L5. Multilevel neural foraminal stenosis is detailed above. 5. Postsurgical changes reflecting C6 laminectomy unchanged spinal canal stenosis compared to the postoperative study from 1 day prior. No definite cord signal abnormality. 6. Extramedullary fluid collection along the dorsal aspect of the cord beginning at T9-T10 extending into the lumbar spine is likely subdural in location but is of uncertain etiology; evolving blood products not excluded. The collection anteriorly displaces the cord and cauda equina nerve roots without frank cord compression or signal abnormality. There is no peripheral enhancement to suggest abscess. 7. Prevertebral edema in the cervical spine which may be postsurgical in nature. 8. New bilateral lower lobe  consolidations may reflect atelectasis or developing pneumonia/aspiration. Electronically Signed   By: Valetta Mole M.D.   On: 10/08/2021 13:18   ECHOCARDIOGRAM COMPLETE  Result Date: 10/08/2021    ECHOCARDIOGRAM REPORT   Patient Name:   Jason [name] Date of Exam: 10/08/2021 Medical Rec #:  [id number]        Height:       69.0 in Accession #:    [id number]       Weight:       201.3 lb Date of Birth:  April 16, 1955        BSA:          2.072 m Patient Age:    68 years         BP:           116/51 mmHg Patient Gender: M                HR:           56 bpm. Exam Location:  [facility] Procedure: 2D Echo, Cardiac Doppler and Color Doppler Indications:     Other abnormalities of the heart 00.8  History:         Patient has prior history of Echocardiogram examinations, most  recent 07/19/2020. Risk Factors:Hypertension. GERD.  Sonographer:     Sherrie Sport Referring Phys:  [id number] Emeterio Reeve Diagnosing Phys: Isaias Cowman MD  Sonographer Comments: Technically challenging study due to limited acoustic windows, echo performed with patient supine and on artificial respirator, no subcostal window and no parasternal window. IMPRESSIONS  1. Left ventricular ejection fraction, by estimation, is 55 to 60%. The left ventricle has normal function. The left ventricle has no regional wall motion abnormalities. Left ventricular diastolic parameters were normal.  2. Right ventricular systolic function is normal. The right ventricular size is normal.  3. The mitral valve is normal in structure. Mild mitral valve regurgitation. No evidence of mitral stenosis.  4. The aortic valve is normal in structure. Aortic valve regurgitation is not visualized. Mild aortic valve stenosis.  5. The inferior vena cava is normal in size with greater than 50% respiratory variability, suggesting right atrial pressure of 3 mmHg. FINDINGS  Left Ventricle: Left ventricular ejection fraction, by estimation, is 55 to 60%. The left  ventricle has normal function. The left ventricle has no regional wall motion abnormalities. The left ventricular internal cavity size was normal in size. There is  no left ventricular hypertrophy. Left ventricular diastolic parameters were normal. Right Ventricle: The right ventricular size is normal. No increase in right ventricular wall thickness. Right ventricular systolic function is normal. Left Atrium: Left atrial size was normal in size. Right Atrium: Right atrial size was normal in size. Pericardium: There is no evidence of pericardial effusion. Mitral Valve: The mitral valve is normal in structure. Mild mitral valve regurgitation. No evidence of mitral valve stenosis. Tricuspid Valve: The tricuspid valve is normal in structure. Tricuspid valve regurgitation is mild . No evidence of tricuspid stenosis. Aortic Valve: The aortic valve is normal in structure. Aortic valve regurgitation is not visualized. Mild aortic stenosis is present. Aortic valve mean gradient measures 7.0 mmHg. Aortic valve peak gradient measures 11.6 mmHg. Aortic valve area, by VTI measures 2.09 cm. Pulmonic Valve: The pulmonic valve was normal in structure. Pulmonic valve regurgitation is not visualized. No evidence of pulmonic stenosis. Aorta: The aortic root is normal in size and structure. Venous: The inferior vena cava is normal in size with greater than 50% respiratory variability, suggesting right atrial pressure of 3 mmHg. IAS/Shunts: No atrial level shunt detected by color flow Doppler.  LEFT VENTRICLE PLAX 2D LVIDd:         3.90 cm   Diastology LVIDs:         2.80 cm   LV e' medial:    6.85 cm/s LV PW:         1.00 cm   LV E/e' medial:  15.6 LV IVS:        1.40 cm   LV e' lateral:   12.70 cm/s LVOT diam:     2.10 cm   LV E/e' lateral: 8.4 LV SV:         65 LV SV Index:   31 LVOT Area:     3.46 cm  RIGHT VENTRICLE RV Basal diam:  3.70 cm RV S prime:     16.80 cm/s TAPSE (M-mode): 2.1 cm LEFT ATRIUM             Index         RIGHT ATRIUM           Index LA diam:        4.50 cm 2.17 cm/m   RA Area:     19.50  cm LA Vol (A2C):   62.1 ml 29.98 ml/m  RA Volume:   51.40 ml  24.81 ml/m LA Vol (A4C):   73.2 ml 35.33 ml/m LA Biplane Vol: 70.9 ml 34.22 ml/m  AORTIC VALVE AV Area (Vmax):    1.75 cm AV Area (Vmean):   1.87 cm AV Area (VTI):     2.09 cm AV Vmax:           170.33 cm/s AV Vmean:          115.967 cm/s AV VTI:            0.312 m AV Peak Grad:      11.6 mmHg AV Mean Grad:      7.0 mmHg LVOT Vmax:         86.20 cm/s LVOT Vmean:        62.500 cm/s LVOT VTI:          0.188 m LVOT/AV VTI ratio: 0.60 MITRAL VALVE                TRICUSPID VALVE MV Area (PHT): 3.15 cm     TR Peak grad:   12.4 mmHg MV Decel Time: 241 msec     TR Vmax:        176.00 cm/s MV E velocity: 107.00 cm/s MV A velocity: 102.00 cm/s  SHUNTS MV E/A ratio:  1.05         Systemic VTI:  0.19 m                             Systemic Diam: 2.10 cm Isaias Cowman MD Electronically signed by Isaias Cowman MD Signature Date/Time: 10/08/2021/1:07:52 PM    Final    DG Chest Port 1 View  Result Date: 10/08/2021 CLINICAL DATA:  Difficulty breathing EXAM: PORTABLE CHEST 1 VIEW COMPARISON:  Previous studies including the examination of 10/07/2021 FINDINGS: Cardiac size is within normal limits. Thoracic aorta is tortuous and ectatic. Tip of endotracheal tube is 4.5 cm above the carina. Enteric tube is noted traversing the esophagus with distal portion of the tube in the stomach. Tip of left IJ central venous catheter is seen in right atrium. There are no signs of pulmonary edema. There is poor inspiration. No new focal pulmonary consolidation is seen. There is improvement in aeration of lower lung fields. There is no significant pleural effusion or pneumothorax. Left shoulder arthroplasty is noted. IMPRESSION: There are no signs of pulmonary edema or focal pulmonary consolidation. There is improvement in aeration of lower lung fields suggesting resolving  subsegmental atelectasis. Electronically Signed   By: Elmer Picker M.D.   On: 10/08/2021 08:28   DG Abd 1 View  Result Date: 10/07/2021 CLINICAL DATA:  Nasogastric tube placement. EXAM: ABDOMEN - 1 VIEW COMPARISON:  October 07, 2021 (6:31 p.m.) FINDINGS: A nasogastric tube is seen with its distal tip seen within the body of the stomach. Properly positioned endotracheal tube and left internal jugular venous catheter are also seen. The bowel gas pattern within the upper abdomen is normal. The remainder of the abdomen and pelvis are not included in the field of view and are limited in evaluation. IMPRESSION: Nasogastric tube positioning, as described above. Electronically Signed   By: Virgina Norfolk M.D.   On: 10/07/2021 21:45   MR CERVICAL SPINE WO CONTRAST  Result Date: 10/07/2021 CLINICAL DATA:  Unable to move legs. Posterior cervical decompression yesterday for metastatic disease. EXAM: MRI CERVICAL SPINE WITHOUT  CONTRAST TECHNIQUE: Multiplanar, multisequence MR imaging of the cervical spine was performed. No intravenous contrast was administered. COMPARISON:  CT cervical spine dated October 05, 2021. MRI cervical spine dated October 03, 2021. FINDINGS: Alignment: New straightening of the normal cervical lordosis. No significant listhesis. Vertebrae: Similar abnormal marrow signal involving the C4 posterior elements and C5 through T2 vertebral bodies and posterior elements. Cord: No abnormal signal. Dorsal epidural tumor at C4 is unchanged. Circumferential epidural tumor from C5-C7 appears similar. No definite superimposed epidural fluid collection. Posterior Fossa, vertebral arteries, paraspinal tissues: Postsurgical changes in the paraspinous soft tissues. No fluid collection. Mild prevertebral soft tissue swelling. Disc levels: C2-C3: Negative disc. Unchanged moderate left and mild right facet arthropathy. No stenosis. C3-C4: Unchanged small posterior disc osteophyte complex, mild bilateral  uncovertebral hypertrophy, and moderate left and mild right facet arthropathy. Unchanged mild spinal canal stenosis. Unchanged moderate left neuroforaminal stenosis. No right neuroforaminal stenosis. C4-C5: Unchanged mild disc bulging and moderate right facet uncovertebral hypertrophy. Similar dorsal epidural tumor. New moderate spinal canal stenosis. Unchanged mild bilateral neuroforaminal stenosis. C5-C6: Interval bilateral C6 hemilaminectomies. Unchanged bulky posterior disc osteophyte complex and severe bilateral uncovertebral hypertrophy. Similar circumferential epidural tumor extending into both neural foramina. Progressive severe spinal canal stenosis. Unchanged biforaminal effacement. C6-C7: Unchanged small posterior disc osteophyte complex and severe bilateral uncovertebral hypertrophy. Similar circumferential epidural tumor extending into both neural foramina. Progressive severe spinal canal stenosis. Unchanged biforaminal effacement. C7-T1: Negative disc. Unchanged partial effacement of the left neural foramen due to tumor. No spinal canal stenosis. IMPRESSION: 1. Interval bilateral C6 hemilaminectomies. Similar degree of epidural tumor from C4-C7 without definite superimposed epidural hematoma or fluid collection. 2. Progressive spinal canal stenosis from C4-C7, severe at C5-C6 and C6-C7. No abnormal spinal cord signal or cord infarct. 3. Similar diffuse cervical osseous metastatic disease. These results were called by telephone at the time of interpretation on 10/07/2021 at 8:05 pm to provider [facility] , who verbally acknowledged these results. Electronically Signed   By: Titus Dubin M.D.   On: 10/07/2021 20:48   DG Abd 1 View  Result Date: 10/07/2021 CLINICAL DATA:  Orogastric tube placement. EXAM: ABDOMEN - 1 VIEW COMPARISON:  None Available. FINDINGS: An enteric tube is seen with its distal tip approximately 3.6 cm distal to the expected region of the gastroesophageal junction. The  bowel gas pattern is normal. A large stool burden is seen within the ascending and proximal transverse colon. No radio-opaque calculi or other significant radiographic abnormality are seen. IMPRESSION: 1. Enteric tube positioning, as described above. Further advancement of the NG tube by proximally 7 cm is recommended to decrease the risk of aspiration. 2. Large stool burden without evidence of bowel obstruction. Electronically Signed   By: Virgina Norfolk M.D.   On: 10/07/2021 18:45   DG Abd 1 View  Result Date: 10/07/2021 CLINICAL DATA:  Orogastric tube placement. EXAM: ABDOMEN - 1 VIEW COMPARISON:  October 07, 2021 FINDINGS: An enteric tube is seen with its distal tip approximately 3.4 cm distal to the expected region of the gastroesophageal junction. This is unchanged in position when compared to the prior study. The bowel gas pattern is normal. A large stool burden is seen within the ascending and proximal transverse colon. No radio-opaque calculi or other significant radiographic abnormality are seen. IMPRESSION: 1. Enteric tube positioning, as described above. Further advancement of the NG tube by approximately 6 cm is recommended to decrease the risk of aspiration. 2. Large stool burden within the ascending and proximal  transverse colon, without evidence of bowel obstruction. Electronically Signed   By: Virgina Norfolk M.D.   On: 10/07/2021 18:43   DG Chest Port 1 View  Result Date: 10/07/2021 CLINICAL DATA:  Hypertension, postoperative day 1 for cervical spine surgery. EXAM: PORTABLE CHEST 1 VIEW COMPARISON:  10/07/2021 4:51 p.m. FINDINGS: An endotracheal tube is present with tip 3.6 cm above the carina. Left central line tip: Lower SVC. A drain is coiled over the right upper chest and lower neck. Reverse left shoulder arthroplasty. Low lung volumes are present, causing crowding of the pulmonary vasculature. Mild bandlike atelectasis in the perihilar regions and bases. No appreciable  pneumothorax. There is some vague osseous densities likely reflecting the patient's known osseous metastatic disease. IMPRESSION: 1. Endotracheal tube placed with tip 3.6 cm above the carina. Left central line tip: Lower SVC. No pneumothorax. 2. Low lung volumes are present with scattered perihilar and basilar atelectasis. 3. Hazy osseous densities likely reflecting the patient's known osseous metastatic disease. Electronically Signed   By: Van Clines M.D.   On: 10/07/2021 18:35   DG Chest Port 1 View  Result Date: 10/07/2021 CLINICAL DATA:  Per MD note, sepsis.  [id number] EXAM: PORTABLE CHEST 1 VIEW COMPARISON:  Chest x-ray 03/11/2020, CT chest 10/05/2021 FINDINGS: Surgical drain and skin staples overly the neck. The heart and mediastinal contours are unchanged. Slightly low lung volumes. Curvilinear densities overlying the right lower lung zone that appear to extend along the anterior ribs likely related to known metastatic osseous disease. No definite focal consolidation. No pulmonary edema. No pleural effusion. No pneumothorax. No acute osseous abnormality. IMPRESSION: 1. Slightly low lung volumes with no acute cardiopulmonary abnormality. 2. Curvilinear densities overlying the right lower lung zone that appear to extend along the anterior ribs likely related to known metastatic osseous disease. Electronically Signed   By: Iven Finn M.D.   On: 10/07/2021 17:09   DG Cervical Spine 2-3 Views  Result Date: 10/06/2021 CLINICAL DATA:  Cervical surgery EXAM: CERVICAL SPINE - 2-3 VIEW COMPARISON:  CT 10/05/2021 FINDINGS: A single C-arm fluoroscopic image was obtained intraoperatively and submitted for post operative interpretation. Lateral image of the cervical spine with tissue spreaders present posteriorly. 2 seconds fluoroscopy time utilized. Radiation dose: 0.9 mGy. Please see the performing provider's procedural report for further detail. IMPRESSION: Intraoperative fluoroscopic image during  cervical spine surgery. Electronically Signed   By: Davina Poke D.O.   On: 10/06/2021 09:28   DG C-Arm 1-60 Min-No Report  Result Date: 10/06/2021 Fluoroscopy was utilized by the requesting physician.  No radiographic interpretation.   CT CERVICAL SPINE WO CONTRAST  Result Date: 10/05/2021 CLINICAL DATA:  Metastatic disease of the cervical spine. Unknown primary. EXAM: CT CERVICAL SPINE WITHOUT CONTRAST TECHNIQUE: Multidetector CT imaging of the cervical spine was performed without intravenous contrast. Multiplanar CT image reconstructions were also generated. RADIATION DOSE REDUCTION: This exam was performed according to the departmental dose-optimization program which includes automated exposure control, adjustment of the mA and/or kV according to patient size and/or use of iterative reconstruction technique. COMPARISON:  MRI of the cervical spine without contrast 10/03/2021 FINDINGS: Alignment: Slight degenerative anterolisthesis again noted at C3-4. Alignment is otherwise anatomic. Reversal of the normal cervical lordosis is noted. Skull base and vertebrae: Craniocervical junction is within normal limits. Extensive heterogeneous sclerotic lesions are present in the cervical spine. Left-sided sclerotic lesions present at C5 and inferiorly at C4 corresponding 2 marrow signal changes on the MRI scan. Diffuse sclerotic changes present at C6 and  C7. Mixed sclerotic and lytic changes are present in the posterior elements at C5 and C6. No pathologic fractures are present. Heterogeneous sclerotic changes are present within the spinous process at T1 and T2. Soft tissues and spinal canal: The extraosseous tumor at C5 and C6 is better appreciated on MRI. Prevertebral soft tissues are within normal limits. Disc levels: Central and foraminal narrowing is greatest at C5-6 and C6-7 is described on the MRI scan. Upper chest: Lung apices are clear. IMPRESSION: 1. Extensive heterogeneous sclerotic lesions throughout  the cervical spine compatible with metastatic disease to the bone. 2. Mixed sclerotic and lytic changes in the posterior elements at C5 and C6. 3. Extraosseous tumor at C5 and C6 is better appreciated on the MRI scan. 4. No pathologic fractures. 5. Central and foraminal narrowing is greatest at C5-6 and C6-7 is described on the MRI scan. Electronically Signed   By: San Morelle M.D.   On: 10/05/2021 12:54   CT CHEST ABDOMEN PELVIS W CONTRAST  Result Date: 10/05/2021 CLINICAL DATA:  Metastatic disease evaluation. EXAM: CT CHEST, ABDOMEN, AND PELVIS WITH CONTRAST TECHNIQUE: Multidetector CT imaging of the chest, abdomen and pelvis was performed following the standard protocol during bolus administration of intravenous contrast. RADIATION DOSE REDUCTION: This exam was performed according to the departmental dose-optimization program which includes automated exposure control, adjustment of the mA and/or kV according to patient size and/or use of iterative reconstruction technique. CONTRAST:  118m OMNIPAQUE IOHEXOL 300 MG/ML  SOLN COMPARISON:  None Available. FINDINGS: CT CHEST FINDINGS Cardiovascular: No significant vascular findings. Normal heart size. No pericardial effusion. Mediastinum/Nodes: No enlarged mediastinal, hilar, or axillary lymph nodes. Thyroid gland, trachea, and esophagus demonstrate no significant findings. Lungs/Pleura: Mild lingular and mild right middle lobe linear atelectasis is seen. There is no evidence of acute infiltrate, pleural effusion or pneumothorax. Musculoskeletal: A left shoulder replacement is seen with associated streak artifact. Hazy, sclerotic appearing areas are seen involving the anterolateral aspects of the second and fourth right ribs. Multilevel degenerative changes seen throughout the thoracic spine. CT ABDOMEN PELVIS FINDINGS Hepatobiliary: No focal liver abnormality is seen. No gallstones, gallbladder wall thickening, or biliary dilatation. Pancreas:  Unremarkable. No pancreatic ductal dilatation or surrounding inflammatory changes. Spleen: Punctate calcified granulomas are seen within the posterior aspect of an otherwise normal-appearing spleen. Adrenals/Urinary Tract: Adrenal glands are unremarkable. Kidneys are normal, without renal calculi, focal lesion, or hydronephrosis. The urinary bladder is limited in evaluation secondary to the presence of overlying streak artifact. Stomach/Bowel: Stomach is within normal limits. The appendix is not identified. Stool is seen throughout the large bowel. No evidence of bowel wall thickening, distention, or inflammatory changes. Noninflamed diverticula are seen throughout the sigmoid colon. Vascular/Lymphatic: Aortic atherosclerosis. Moderate severity Para-aortic and aortocaval lymphadenopathy is seen. This extends into the pelvis along the bilateral iliac chains, right greater than left Reproductive: The prostate gland is markedly limited in evaluation secondary to the presence of overlying streak artifact. Other: No abdominal wall hernia or abnormality. No abdominopelvic ascites. 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 There is an extensive amount of associated streak artifact with subsequently limited evaluation of the adjacent osseous and soft tissue structures. Small lytic areas are seen at the levels of L4 and S1, with diffusely sclerotic changes seen throughout the remainder of the sacrum and left iliac bone. A mixed cystic and sclerotic lesion is seen within the right iliac bone. Multilevel degenerative changes are seen throughout the lumbar spine. IMPRESSION: 1. Moderate severity retroperitoneal and pelvic lymphadenopathy, consistent  with metastatic disease. 2. Small lytic areas at the levels of L4 and S1, with diffusely sclerotic changes involving the second and fourth right ribs, sacrum and left iliac bone. 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 Further evaluation with  a whole body nuclear medicine bone scan is recommended. 3. 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 4. Sigmoid diverticulosis. 5. Aortic atherosclerosis. Aortic Atherosclerosis (ICD10-I70.0). Electronically Signed   By: Virgina Norfolk M.D.   On: 10/05/2021 01:56   MR CERVICAL SPINE WO CONTRAST  Result Date: 10/03/2021 CLINICAL DATA:  Neck and left shoulder pain EXAM: MRI CERVICAL SPINE WITHOUT CONTRAST TECHNIQUE: Multiplanar, multisequence MR imaging of the cervical spine was performed. No intravenous contrast was administered. COMPARISON:  None Available. FINDINGS: Alignment: Reversal of cervical lordosis. Mild anterolisthesis at C3-C4. Vertebrae: Multifocal abnormal marrow signal particularly involving C4-C7 and included upper thoracic vertebral bodies. This includes the posterior elements. Dorsal epidural soft tissue is present at C4. Circumferential involvement is present at C5-C7. There is marked canal stenosis dorsal to C6 without cord compression. Cord: No abnormal signal. Posterior Fossa, vertebral arteries, paraspinal tissues: Partially imaged paranasal sinus opacification. Disc levels: C2-C3:  Left facet hypertrophy.  No canal or foraminal stenosis. C3-C4: Disc bulge with endplate osteophytes. Left facet hypertrophy. Effacement of the ventral subarachnoid space without significant canal narrowing. No foraminal stenosis. C4-C5: Disc bulge with superimposed small right foraminal protrusion and endplate osteophytes. Left greater than right facet hypertrophy. Dorsal epidural soft tissue. No canal stenosis. No left foraminal stenosis. Probable mild right foraminal stenosis secondary to extraosseous disease. C5-C6: Disc bulge with endplate osteophytes. Facet and uncovertebral hypertrophy. Circumferential epidural soft tissue. Moderate to marked canal stenosis at disc level. Effacement of left greater than right foramina due to extraosseous disease. C6-C7:  Disc bulge with endplate osteophytes. Uncovertebral and facet hypertrophy. Moderate canal stenosis at disc level. Effacement of left greater than right foramina due to extraosseous disease. C7-T1: No canal or right foraminal stenosis. Partial effacement left foramen due to extraosseous disease. IMPRESSION: Multifocal abnormal marrow signal with relatively diffuse involvement of C4 into the thoracic spine. Associated epidural disease, greatest at C6 where there is marked canal stenosis without cord compression. Left greater than right foraminal effacement due to extraosseous disease. May reflect metastatic disease or marrow infiltrative neoplastic process. These results will be called to the ordering clinician or representative by the Radiologist Assistant, and communication documented in the PACS or [employer]. Electronically Signed   By: Macy Mis M.D.   On: 10/03/2021 10:28    Microbiology: Results for orders placed or performed during the hospital encounter of 10/04/21  MRSA Next Gen by PCR, Nasal     Status: None   Collection Time: 10/07/21  4:54 PM   Specimen: Nasal Mucosa; Nasal Swab  Result Value Ref Range Status   MRSA by PCR Next Gen NOT DETECTED NOT DETECTED Final    Comment: (NOTE) The GeneXpert MRSA Assay (FDA approved for NASAL specimens only), is one component of a comprehensive MRSA colonization surveillance program. It is not intended to diagnose MRSA infection nor to guide or monitor treatment for MRSA infections. Test performance is not FDA approved in patients less than 25 years old. Performed at [facility], [address] [place],  [address]   Urine Culture     Status: None   Collection Time: 10/07/21  9:17 PM   Specimen: Urine, Random  Result Value Ref Range Status   Specimen Description  Final    URINE, RANDOM Performed at [facility], [address], [place], [place] [address]    Special Requests   Final    NONE Performed  at [facility], [address] [place], [place] [address]    Culture   Final    NO GROWTH Performed at [facility], [place] [address] [place], [place] [address]    Report Status 10/09/2021 FINAL  Final  Culture, blood (Routine X 2) w Reflex to ID Panel     Status: None   Collection Time: 10/07/21  9:18 PM   Specimen: BLOOD  Result Value Ref Range Status   Specimen Description BLOOD BLOOD RIGHT HAND  Final   Special Requests   Final    BOTTLES DRAWN AEROBIC AND ANAEROBIC Blood Culture results may not be optimal due to an inadequate volume of blood received in culture bottles   Culture   Final    NO GROWTH 5 DAYS Performed at [facility], [address] [place], [place] [address]    Report Status 10/12/2021 FINAL  Final  Culture, blood (Routine X 2) w Reflex to ID Panel     Status: None   Collection Time: 10/07/21  9:29 PM   Specimen: BLOOD  Result Value Ref Range Status   Specimen Description BLOOD BLOOD LEFT HAND  Final   Special Requests   Final    BOTTLES DRAWN AEROBIC AND ANAEROBIC Blood Culture results may not be optimal due to an inadequate volume of blood received in culture bottles   Culture   Final    NO GROWTH 5 DAYS Performed at [facility], [address] [place], [place] [address]    Report Status 10/12/2021 FINAL  Final  Culture, blood (Routine X 2) w Reflex to ID Panel     Status: None   Collection Time: 10/12/21  7:48 PM   Specimen: BLOOD RIGHT FOREARM  Result Value Ref Range Status   Specimen Description BLOOD RIGHT FOREARM  Final   Special Requests   Final    BOTTLES DRAWN AEROBIC AND ANAEROBIC Blood Culture adequate volume   Culture   Final    CANCEL PER DR KASA ON 10/13/21 QSD Performed at [facility], [address] [place], [place] [address]    Report Status 10/13/2021 FINAL  Final  Culture, blood (Routine X 2) w Reflex to ID Panel     Status: None   Collection Time: 10/12/21  7:54 PM   Specimen:  BLOOD RIGHT HAND  Result Value Ref Range Status   Specimen Description BLOOD RIGHT HAND  Final   Special Requests   Final    BOTTLES DRAWN AEROBIC AND ANAEROBIC Blood Culture adequate volume   Culture   Final    CANCEL PER DR KASA ON 10/13/21 QSD Performed at [facility], [address] [place], [place] [address]    Report Status 10/13/2021 FINAL  Final  Culture, blood (Routine X 2) w Reflex to ID Panel     Status: None (Preliminary result)   Collection Time: 10/13/21  1:39 PM   Specimen: Right Antecubital; Blood  Result Value Ref Range Status   Specimen Description RIGHT ANTECUBITAL  Final   Special Requests   Final    BOTTLES DRAWN AEROBIC AND ANAEROBIC Blood Culture adequate volume   Culture   Final    NO GROWTH 4 DAYS Performed at [facility], [address] [place], [place] [address]  Report Status PENDING  Incomplete  Culture, blood (Routine X 2) w Reflex to ID Panel     Status: None (Preliminary result)   Collection Time: 10/13/21  1:39 PM   Specimen: BLOOD RIGHT HAND  Result Value Ref Range Status   Specimen Description BLOOD RIGHT HAND  Final   Special Requests   Final    BOTTLES DRAWN AEROBIC AND ANAEROBIC Blood Culture adequate volume   Culture   Final    NO GROWTH 4 DAYS Performed at [facility], [address] [place], [place] [address]    Report Status PENDING  Incomplete    Labs: CBC: Recent Labs  Lab 10/11/21 0500 10/12/21 0426 10/13/21 0159 10/14/21 0452 10/15/21 0429  WBC 13.9* 13.1* 12.8* 12.1*  --   NEUTROABS  --   --  10.4* 8.8*  --   HGB 9.2* 8.6* 7.7* 7.6* 7.7*  HCT 28.2* 25.9* 23.8* 23.5*  --   MCV 93.4 92.2 94.8 95.9  --   PLT 173 170 160 200  --    Basic Metabolic Panel: Recent Labs  Lab 10/11/21 0500 10/12/21 0426 10/13/21 0159 10/14/21 0452 10/16/21 1517  NA 136 133* 136 139 136  K 3.7 4.4 3.8 4.2 3.7  CL 106 104 110 114* 107  CO2 '23 22 22 '$ 21* 23  GLUCOSE 93 108* 131* 102* 144*  BUN 32*  55* 33* 20 16  CREATININE 0.51* 1.42* 0.64 0.55* 0.52*  CALCIUM 7.8* 7.6* 7.3* 8.2* 7.8*  MG  --  2.6* 2.5* 2.4  --   PHOS  --  4.8* 3.2 3.0  --    Liver Function Tests: Recent Labs  Lab 10/11/21 0500 10/12/21 0426 10/13/21 0159 10/14/21 0452 10/16/21 1517  AST 121* 70* 56* 102* 76*  ALT 134* 101* 80* 125* 112*  ALKPHOS 170* 150* 132* 165* 315*  BILITOT 1.4* 1.2 1.0 0.9 1.0  PROT 5.8* 5.4* 5.0* 5.3* 5.5*  ALBUMIN 2.8* 2.5* 2.3* 2.7* 2.7*   CBG: Recent Labs  Lab 10/16/21 1701 10/16/21 1934 10/16/21 2338 10/17/21 0320 10/17/21 0755  GLUCAP 125* 114* 118* 91 106*    Discharge time spent: greater than 30 minutes.  Signed: Fritzi Mandes, MD [facility] 10/17/2021

## 2021-10-17 NOTE — Progress Notes (Signed)
Tumor Board Documentation  TEVEN MITTMAN was presented by [name] at our Tumor Board on 10/17/2021, which included representatives from medical oncology, surgical, pharmacy, pulmonology, radiology, genetics, pathology, radiation oncology, navigation, research, internal medicine, palliative care.  Hazem currently presents as a new patient, for [place], for new positive pathology with history of the following treatments: active survellience, surgical intervention(s) (Biopsy).  Additionally, we reviewed previous medical and familial history, history of present illness, and recent lab results along with all available histopathologic and imaging studies. The tumor board considered available treatment options and made the following recommendations: Radiation therapy (primary modality) ADT [place]) Oral Chemotherapy  The following procedures/referrals were also placed: No orders of the defined types were placed in this encounter.   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  Tumor board is a meeting of clinicians from various specialty areas who evaluate and discuss patients for whom a multidisciplinary approach is being considered. Final determinations in the plan of care are those of the provider(s). The responsibility for follow up of recommendations given during tumor board is that of the provider.   Today's extended care, comprehensive team conference, Josimar was not present for the discussion and was not examined.   Multidisciplinary Tumor Board is a multidisciplinary case peer review process.  Decisions discussed in the Multidisciplinary Tumor Board reflect the opinions of the specialists present at the conference without having examined the patient.  Ultimately, treatment and diagnostic  decisions rest with the primary provider(s) and the patient.

## 2021-10-17 NOTE — Progress Notes (Signed)
History: Jason [name] is s/p C6 laminectomy for resection of epidural mass  POD11: NAEO  POD10: transferred to floor overnight. No concerns this afternoon   POD9: NAEO  POD8: pt reports some improved breathing overnight  POD7:  Patient spiked fever.  Incision c/d/I.  Very low suspicion of SSI.  Likely aspiration pneumonia.   POD6:  No significant neurological changes. Patient subjectively feels hands are better.   POD5: no significant neurologic changes overnight, Continues to have anxiety.    POD4: continues to have weakness in his arms and legs.  He states he does have sensation in his legs however.  He also endorses significant anxiety regarding his recent metastatic cancer diagnosis   POD2: Pt was transferred to the ICU after yesterdays events. Pt remains intubated and sedated this morning. Wife at bedside.    POD1 PM: I was contacted to see [name] at approximately 1615.  I saw him in his room at approximately 1620, and was able to review the clinical situation with his wife.  From review with her, it seems that [name] was having some issues with his arms when she arrived at around 39, when he reported some difficulty feeding himself with lunch.  Per nursing, his nurses also noted that he was having some difficulty with movement of his arms and legs, but he improved with a fluid bolus around 1300.    When I saw him at 1620, he was arousable but not readily interactive with examination.  He moved his arms but was not reliable. He was noted to be significantly hypotensive.  ICU attending Dr. Tamala Julian, hospitalist Dr. Sheppard Coil, and I reviewed the case and all agreed transfer to ICU was appropriate.  This was done, and his blood pressure improved with another fluid bolus.   I then reevaluated him, noted below.   POD1 AM: Increased tingling in right hand this morning. Reports good pain control   Physical Exam:        Vitals:    10/11/21 0400 10/11/21 0826  BP: 124/76     Pulse: 89    Resp: (!) 24    Temp: 98.3 F (36.8 C)    SpO2: 93% 92%      AA Ox3  CNI 4/5 bilateral deltoids 3/5 left triceps and 2/5 left bicep 3/5 right triceps and bicep. 2/5 left HG 3/5 right HG. 1/5 left quad otherwise 0/5 bilateral legs Incision intact with staples in place and open to air.    Data:       Last Labs            Recent Labs  Lab 10/09/21 0429 10/10/21 0455 10/11/21 0500  NA 136 136 136  K 4.1 3.8 3.7  CL 108 107 106  CO2 '23 24 23  '$ BUN 29* 46* 32*  CREATININE 0.69 0.82 0.51*  GLUCOSE 130* 143* 93  CALCIUM 8.2* 8.2* 7.8*       Last Labs        Recent Labs  Lab 10/11/21 0500  AST 121*  ALT 134*  ALKPHOS 170*         Last Labs            Recent Labs  Lab 10/09/21 0429 10/10/21 0455 10/11/21 0500  WBC 16.4* 15.3* 13.9*  HGB 10.0* 8.8* 9.2*  HCT 30.4* 26.5* 28.2*  PLT 137* 175 173       Last Labs   No results for input(s): "APTT", "INR" in the last 168 hours.  Assessment/Plan:   Jason [name] is a 66 y.o s/p C6 laminectomy for resection of epidural mass.   - Appreciate hospitalist care.   - some muscle spasm in right lower leg. Can consider Baclofen for this.  - Ok to leave incision open to air. Looks good. Will remove staples if patient still admitted at [facility]  - Please call with any questions or concerns   Cooper Render PA-C Department of Neurosurgery

## 2021-10-18 ENCOUNTER — Encounter: Payer: BC Managed Care – PPO | Admitting: Internal Medicine

## 2021-10-18 ENCOUNTER — Encounter (HOSPITAL_COMMUNITY): Payer: Self-pay | Admitting: Physical Medicine and Rehabilitation

## 2021-10-18 ENCOUNTER — Other Ambulatory Visit: Payer: Self-pay

## 2021-10-18 ENCOUNTER — Inpatient Hospital Stay (HOSPITAL_COMMUNITY): Payer: BC Managed Care – PPO

## 2021-10-18 DIAGNOSIS — G8250 Quadriplegia, unspecified: Secondary | ICD-10-CM | POA: Diagnosis present

## 2021-10-18 DIAGNOSIS — C61 Malignant neoplasm of prostate: Secondary | ICD-10-CM | POA: Diagnosis not present

## 2021-10-18 DIAGNOSIS — C7940 Secondary malignant neoplasm of unspecified part of nervous system: Secondary | ICD-10-CM | POA: Diagnosis not present

## 2021-10-18 LAB — COMPREHENSIVE METABOLIC PANEL
ALT: 96 U/L — ABNORMAL HIGH (ref 0–44)
AST: 62 U/L — ABNORMAL HIGH (ref 15–41)
Albumin: 2.5 g/dL — ABNORMAL LOW (ref 3.5–5.0)
Alkaline Phosphatase: 457 U/L — ABNORMAL HIGH (ref 38–126)
Anion gap: 5 (ref 5–15)
BUN: 15 mg/dL (ref 8–23)
CO2: 24 mmol/L (ref 22–32)
Calcium: 8.2 mg/dL — ABNORMAL LOW (ref 8.9–10.3)
Chloride: 109 mmol/L (ref 98–111)
Creatinine, Ser: 0.51 mg/dL — ABNORMAL LOW (ref 0.61–1.24)
GFR, Estimated: >60 mL/min (ref >60)
Glucose, Bld: 97 mg/dL (ref 70–99)
Potassium: 3.7 mmol/L (ref 3.5–5.1)
Sodium: 138 mmol/L (ref 135–145)
Total Bilirubin: 0.9 mg/dL (ref 0.3–1.2)
Total Protein: 5.4 g/dL — ABNORMAL LOW (ref 6.5–8.1)

## 2021-10-18 LAB — CBC WITH DIFFERENTIAL/PLATELET
Abs Immature Granulocytes: 0 10*3/uL (ref 0.00–0.07)
Basophils Absolute: 0.2 10*3/uL — ABNORMAL HIGH (ref 0.0–0.1)
Basophils Relative: 1 %
Eosinophils Absolute: 0 10*3/uL (ref 0.0–0.5)
Eosinophils Relative: 0 %
HCT: 25.3 % — ABNORMAL LOW (ref 39.0–52.0)
Hemoglobin: 8.4 g/dL — ABNORMAL LOW (ref 13.0–17.0)
Lymphocytes Relative: 6 %
Lymphs Abs: 1 10*3/uL (ref 0.7–4.0)
MCH: 31.2 pg (ref 26.0–34.0)
MCHC: 33.2 g/dL (ref 30.0–36.0)
MCV: 94.1 fL (ref 80.0–100.0)
Monocytes Absolute: 0.9 10*3/uL (ref 0.1–1.0)
Monocytes Relative: 5 %
Neutro Abs: 15 10*3/uL — ABNORMAL HIGH (ref 1.7–7.7)
Neutrophils Relative %: 88 %
Platelets: 319 10*3/uL (ref 150–400)
RBC: 2.69 MIL/uL — ABNORMAL LOW (ref 4.22–5.81)
RDW: 14.3 % (ref 11.5–15.5)
WBC: 17.1 10*3/uL — ABNORMAL HIGH (ref 4.0–10.5)
nRBC: 0.6 % — ABNORMAL HIGH (ref 0.0–0.2)
nRBC: 1 /100 WBC — ABNORMAL HIGH

## 2021-10-18 LAB — GLUCOSE, CAPILLARY: Glucose-Capillary: 96 mg/dL (ref 70–99)

## 2021-10-18 LAB — CULTURE, BLOOD (ROUTINE X 2)
Culture: NO GROWTH
Culture: NO GROWTH
Special Requests: ADEQUATE
Special Requests: ADEQUATE

## 2021-10-18 MED ORDER — CHLORHEXIDINE GLUCONATE CLOTH 2 % EX PADS
6.0000 | MEDICATED_PAD | Freq: Two times a day (BID) | CUTANEOUS | Status: DC
  Administered 2021-10-18 – 2021-11-19 (×63): 6 via TOPICAL

## 2021-10-18 MED ORDER — ENOXAPARIN SODIUM 100 MG/ML IJ SOSY
100.0000 mg | PREFILLED_SYRINGE | Freq: Two times a day (BID) | INTRAMUSCULAR | Status: DC
  Administered 2021-10-18 – 2021-11-04 (×34): 100 mg via SUBCUTANEOUS
  Filled 2021-10-18 (×34): qty 1

## 2021-10-18 NOTE — Progress Notes (Signed)
PROGRESS NOTE   Subjective/Complaints:  Pt reports has hot flashes and wife to bring in clothes and fan this AM.  Breathing better than yesterday and days prior- on 2L O2.  Spitting up better as well.  Spasms not painful.    ROS:  Pt denies SOB, abd pain, CP, N/V/C/D, and vision changes  Objective:   DG CHEST PORT 1 VIEW  Result Date: 10/17/2021 CLINICAL DATA:  Diarrhea, hypoxia. EXAM: PORTABLE CHEST 1 VIEW COMPARISON:  Chest radiograph dated 10/12/2021. FINDINGS: The heart size and mediastinal contours are within normal limits. The lung volumes are low. There is mild bibasilar atelectasis. A trace right pleural effusion may contribute. There is no left pleural effusion. There is no pneumothorax. Degenerative changes are seen in the spine. IMPRESSION: Mild bibasilar atelectasis. A trace right pleural effusion may contribute. Electronically Signed   By: Zerita Boers M.D.   On: 10/17/2021 20:08   DG Abd Portable 1V  Result Date: 10/17/2021 CLINICAL DATA:  Diarrhea, hypoxia. EXAM: PORTABLE ABDOMEN - 1 VIEW COMPARISON:  Abdominal radiograph dated 10/12/2021. FINDINGS: The bowel gas pattern is normal. Gas overlies the rectum. No radio-opaque calculi along the urinary tract. Degenerative changes are seen in the spine. Bilateral hip arthroplasties are noted. IMPRESSION: Nonobstructive bowel gas pattern. Electronically Signed   By: Zerita Boers M.D.   On: 10/17/2021 20:07   Recent Labs    10/18/21 0640  WBC 17.1*  HGB 8.4*  HCT 25.3*  PLT 319   Recent Labs    10/16/21 1517 10/18/21 0640  NA 136 138  K 3.7 3.7  CL 107 109  CO2 23 24  GLUCOSE 144* 97  BUN 16 15  CREATININE 0.52* 0.51*  CALCIUM 7.8* 8.2*    Intake/Output Summary (Last 24 hours) at 10/18/2021 0745 Last data filed at 10/18/2021 4496 Gross per 24 hour  Intake 236 ml  Output 1400 ml  Net -1164 ml        Physical Exam: Vital Signs Blood pressure  126/67, pulse 75, temperature 98.3 F (36.8 C), resp. rate 15, height _0  (1.753 m), weight 99.9 kg, SpO2 94 %.    General: awake, alert, appropriate, arms propped on pillows this AM; has soft call bell available; NAD HENT: conjugate gaze; oropharynx moist CV: regular rate; no JVD Pulmonary: decreased at bases, but good in upper lobes; no W/R/R GI: soft, NT, ND, (+)BS Psychiatric: appropriate Neurological: Ox3 Genitourinary:    Comments: Medium amber urine in foley Musculoskeletal:     Cervical back: Neck supple. Tenderness present.     Comments: RUE- biceps 5/5; WE 4-/5; Triceps 2+/5; Grip 3-/5; FA 2-/5 LUE- biceps 5-/5; WE 4/5; Triceps 2+/5' Grip 3+/5; FA 2-/5 RLE- 0/5 in RLE LLE- HF 1/5; KE 2-/5; DF 1/5; PF 2-/5; EHL 1/5   Skin:    General: Skin is warm and dry.  Neurological:     Mental Status: He is alert.     Comments: Soft voice with weak cough. Decreased postural control --listing to the right and neck with decreased ROM. Able to passively get to midline without discomfort.    Decreased to light touch at L1 down to S1 B/L- unable to  check S2-S5 today Almost constant spasms of RLE during exam; a few spasms seen on LLE Clonus sustained on RLE; and a few beats on LLE Hoffman's (+) B/L Ue's;  Ox3- but very slightly delayed on responses   Assessment/Plan: 1. Functional deficits which require 3+ hours per day of interdisciplinary therapy in a comprehensive inpatient rehab setting. Physiatrist is providing close team supervision and 24 hour management of active medical problems listed below. Physiatrist and rehab team continue to assess barriers to discharge/monitor patient progress toward functional and medical goals  Care Tool:  Bathing              Bathing assist       Upper Body Dressing/Undressing Upper body dressing        Upper body assist      Lower Body Dressing/Undressing Lower body dressing            Lower body assist        Toileting Toileting    Toileting assist       Transfers Chair/bed transfer  Transfers assist           Locomotion Ambulation   Ambulation assist              Walk 10 feet activity   Assist           Walk 50 feet activity   Assist           Walk 150 feet activity   Assist           Walk 10 feet on uneven surface  activity   Assist           Wheelchair     Assist               Wheelchair 50 feet with 2 turns activity    Assist            Wheelchair 150 feet activity     Assist          Blood pressure 126/67, pulse 75, temperature 98.3 F (36.8 C), resp. rate 15, height _0  (1.753 m), weight 99.9 kg, SpO2 94 %.  Medical Problem List and Plan: 1. 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  Antithrombotics: -DVT/anticoagulation:  Pharmaceutical: Lovenox. Check dopplers in am.              -antiplatelet therapy: ASA 3. Pain Management: Oxycodone prn.  4. Mood/Behavior/Sleep: LCSW to follow for evaluation and support.              -antipsychotic agents: N/A 5. Neuropsych/cognition: This patient is capable of making decisions on his own behalf. 6. Skin/Wound Care: Routine pressure relief measures              --monitor incision for healing.  7. Fluids/Electrolytes/Nutrition: Monitor I/O. Check CMET in am.  8. Prostate AdenoCA w/mets: Loading dose Firmagon 240 mg 10/06/21 per Dr. Tasia Catchings --Charlesetta Garibaldi changed to [facility] due to insurance coverage--next dose around 09/13. --Simulation for XRT 09/31 at 2 pm at [facility] 9. Dysphagia: Continue D3 w/chopped meats and assist at meals. Aspiration precautions.  10. Neurogenic bladder: continue foley  for now 11.  Neurogenic bowel: Will get KUB to determine stool burden --Colace in am with suppository after supper.  8/25- had good results with bowel program 12. Sepsis/Likely aspiration PNA: Now on Augmentin--antibiotic D#5/10. BC pend-->neg so far.              --Follow up CXR in am.              --pulmonary hygiene for atelectasis/Asymmetric volume loss on right.  13. Abnormal LFTs: Improved overall except for elevation in Aphos--315. Will recheck in am  8/25- Alk phos up to 457; AST 62 and ALT 96- except for alk phos, they are heading downwards- con't to monitor weekly.  14. ABLA: Recheck in am 15. Leucocytosis: Recheck in am. Monitor for fevers/other signs of infection.    8/25- WBC 17.1k- on Augmentin- will recheck labs in AM 16. Hypotension: Will continue midodrine for now.    8/25- BP better 120s/70s- con't regimen     LOS: 1 days A FACE TO FACE EVALUATION WAS PERFORMED  Valery Amedee 10/18/2021, 7:45 AM

## 2021-10-18 NOTE — Plan of Care (Signed)
  Problem: RH Balance Goal: LTG: Patient will maintain dynamic sitting balance (OT) Description: LTG:  Patient will maintain dynamic sitting balance with assistance during activities of daily living (OT) Flowsheets (Taken 10/18/2021 2200) LTG: Pt will maintain dynamic sitting balance during ADLs with: Minimal Assistance - Patient > 75%   Problem: RH Eating Goal: LTG Patient will perform eating w/assist, cues/equip (OT) Description: LTG: Patient will perform eating with assist, with/without cues using equipment (OT) Flowsheets (Taken 10/18/2021 2200) LTG: Pt will perform eating with assistance level of: Set up assist    Problem: RH Grooming Goal: LTG Patient will perform grooming w/assist,cues/equip (OT) Description: LTG: Patient will perform grooming with assist, with/without cues using equipment (OT) Flowsheets (Taken 10/18/2021 2200) LTG: Pt will perform grooming with assistance level of: Contact Guard/Touching assist   Problem: RH Bathing Goal: LTG Patient will bathe all body parts with assist levels (OT) Description: LTG: Patient will bathe all body parts with assist levels (OT) Flowsheets (Taken 10/18/2021 2200) LTG: Pt will perform bathing with assistance level/cueing: Moderate Assistance - Patient 50 - 74%   Problem: RH Dressing Goal: LTG Patient will perform upper body dressing (OT) Description: LTG Patient will perform upper body dressing with assist, with/without cues (OT). Flowsheets (Taken 10/18/2021 2200) LTG: Pt will perform upper body dressing with assistance level of: Moderate Assistance - Patient 50 - 74% Goal: LTG Patient will perform lower body dressing w/assist (OT) Description: LTG: Patient will perform lower body dressing with assist, with/without cues in positioning using equipment (OT) Flowsheets (Taken 10/18/2021 2200) LTG: Pt will perform lower body dressing with assistance level of: Moderate Assistance - Patient 50 - 74%   Problem: RH Toileting Goal: LTG  Patient will perform toileting task (3/3 steps) with assistance level (OT) Description: LTG: Patient will perform toileting task (3/3 steps) with assistance level (OT)  Flowsheets (Taken 10/18/2021 2200) LTG: Pt will perform toileting task (3/3 steps) with assistance level: Other (Comment) Note: Caregiver will be indep with assisting with toileting    Problem: RH Functional Use of Upper Extremity Goal: LTG Patient will use RT/LT upper extremity as a (OT) Description: LTG: Patient will use right/left upper extremity as a stabilizer/gross assist/diminished/nondominant/dominant level with assist, with/without cues during functional activity (OT) Flowsheets (Taken 10/18/2021 2200) LTG: Use of upper extremity in functional activities:  RUE as diminished level  LUE as diminished level LTG: Pt will use upper extremity in functional activity with assistance level of: Minimal Assistance - Patient > 75%   Problem: RH Toilet Transfers Goal: LTG Patient will perform toilet transfers w/assist (OT) Description: LTG: Patient will perform toilet transfers with assist, with/without cues using equipment (OT) Flowsheets (Taken 10/18/2021 2200) LTG: Pt will perform toilet transfers with assistance level of: Maximal Assistance - Patient 25 - 49% Note: And caregiver independent    Problem: RH Tub/Shower Transfers Goal: LTG Patient will perform tub/shower transfers w/assist (OT) Description: LTG: Patient will perform tub/shower transfers with assist, with/without cues using equipment (OT) Flowsheets (Taken 10/18/2021 2200) LTG: Pt will perform tub/shower stall transfers with assistance level of: Maximal Assistance - Patient 25 - 49%

## 2021-10-18 NOTE — Progress Notes (Signed)
Inpatient Rehabilitation Admission Medication Review by a Pharmacist  A complete drug regimen review was completed for this patient to identify any potential clinically significant medication issues.  High Risk Drug Classes Is patient taking? Indication by Medication  Antipsychotic Yes Compazine IM/PO - N/V  Anticoagulant Yes Lovenox - DVT px  Antibiotic Yes Augmentin - PNA  Opioid Yes Percocet - pain  Antiplatelet Yes ASA - CVA  Hypoglycemics/insulin No   Vasoactive Medication No   Chemotherapy No   Other Yes Ator - HLD Clonazapem - anxiety Prozac - depression Famotidine - reflux Synthroid - hypothyroidism Mobic - pain Midodrine - hypotension Flomax - BPH Claritin - allergy     Type of Medication Issue Identified Description of Issue Recommendation(s)  Drug Interaction(s) (clinically significant)     Duplicate Therapy     Allergy     No Medication Administration End Date     Incorrect Dose     Additional Drug Therapy Needed  Dc note said to resume spironolactone and lisinopril and dc the midodrine but midodrine ordered to continue. Continue to hold home lisinopril/spironolactone. Continue midodrine per Dr. Dagoberto Ligas  Significant med changes from prior encounter (inform family/care partners about these prior to discharge).    Other       Clinically significant medication issues were identified that warrant physician communication and completion of prescribed/recommended actions by midnight of the next day:  [facility]  Name of provider notified for urgent issues identified: Dr Dagoberto Ligas  Provider Method of Notification: Secure chat    Pharmacist comments:   Time spent performing this drug regimen review (minutes):  25

## 2021-10-18 NOTE — Progress Notes (Signed)
Bowel program initiated, dig stim performed, small stool returned. Suppository inserted. Dig stim performed 30 minutes after. Additional stool returned. Pt educated on importance. Pt in agreement.  Sheela Stack, LPN

## 2021-10-18 NOTE — Progress Notes (Signed)
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 [facility]) Active Problems:   Acute incomplete quadriplegia [facility])  Past Medical History:  Past Medical History:  Diagnosis Date   Allergy    Arthritis    osteoarthritis   GERD (gastroesophageal reflux disease)    Hypertension    Hypothyroidism    Renal calculi    Thyroid disease    Hyperthyroidism s/p radioactive iodine ablation   Past Surgical History:  Past Surgical History:  Procedure Laterality Date   CARDIAC CATHETERIZATION  03/2000   HERNIA REPAIR  [id number]   umbilical   LEFT HEART CATH AND CORONARY ANGIOGRAPHY Left 03/20/2020   Procedure: LEFT HEART CATH AND CORONARY ANGIOGRAPHY;  Surgeon: Nelva Bush, MD;  Location: [place] [facility];  Service: Cardiovascular;  Laterality: Left;   POSTERIOR CERVICAL FUSION/FORAMINOTOMY N/Jason 10/06/2021   Procedure: POSTERIOR CERVICAL FUSION/ FORAMINOTOMY LEVEL 3;  Surgeon: Meade Maw, MD;  Location: [facility];  Service: Neurosurgery;  Laterality: N/Jason;  will need monitoring   RADIOLOGY WITH ANESTHESIA Left 04/18/2021   Procedure: MRI SHOULDER WITHOUT CONTRAST WITH ANESTHESIA;  Surgeon: Radiologist, Medication, MD;  Location: [place];  Service: Radiology;  Laterality: Left;   RADIOLOGY WITH ANESTHESIA N/Jason 10/03/2021   Procedure: MRI CERVICAL SPINE WITH ANESTHESIA;  Surgeon: Radiologist, Medication, MD;  Location: [place];  Service: Radiology;  Laterality: N/Jason;   REVERSE SHOULDER ARTHROPLASTY Left    ROTATOR CUFF REPAIR  11/2010   Dr [name] Heck   TONSILLECTOMY     TOTAL HIP ARTHROPLASTY Right 02/24/2009   TOTAL HIP ARTHROPLASTY Left 06/2016   Dr Harlow Mares   Social History:  reports that he has never smoked. He has never used smokeless tobacco. He reports that he does not currently  use alcohol. He reports that he does not use drugs.  Family / Support Systems Marital Status: Married How Long?: 33 years (anniversary 9/9) Patient Roles: Spouse Spouse/Significant Other: Jason [name] (wife) [phone] Children: N/Jason Other Supports: friends Anticipated Caregiver: wife Jason [name] Ability/Limitations of Caregiver: No concerns reported Caregiver Availability: 24/7 Family Dynamics: Pt lives with his wife and their pets.  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 [place] - How often do you need to have someone help you when you read instructions, pamphlets, or other written material from your doctor or pharmacy?: Never Writes: Yes Employment Status: Employed Name of Employer: [employer] Length of Employment:  (16+yrs) Return to Work Plans: TBD Public relations account executive Issues: Denies Insurance account manager: Denies   Abuse/Neglect Abuse/Neglect Assessment Can Be Completed: Yes Physical Abuse: Denies Verbal Abuse: Denies Sexual Abuse: Denies Exploitation of patient/patient's resources: Denies Self-Neglect: Denies  Patient response to: Social Isolation - How often do you feel lonely or isolated from those around you?: Never  Emotional Status Pt's affect, behavior and adjustment status: Pt in good spirits at time of visit. He mentioned the bowl incontinence bothers him and he would like this to resolve. Recent Psychosocial Issues: See above Psychiatric History: Denies any hx Substance Abuse History: Pt reports he has not drank alochol in 3+ yrs. No rec drug use reported.  Patient / Family Perceptions, Expectations & Goals Pt/Family understanding of illness & functional limitations: Pt and wife have Jason general understanding of pt care needs Premorbid pt/family roles/activities:  Independent Anticipated changes in  roles/activities/participation: Assitstance with ADLs/IADLs Pt/family expectations/goals: Pt and wife's goal is to workign on being as independent as possible. He woul dike to be able to get from bed to wheelchair to bathroom and realizes he will require assistance.  Community Resources [employer] Scripts: None Premorbid Home Care/DME Agencies: None Transportation available at discharge: TBD Is the patient able to respond to transportation needs?: Yes In the past 12 months, has lack of transportation kept you from medical appointments or from getting medications?: No In the past 12 months, has lack of transportation kept you from meetings, work, or from getting things needed for daily living?: No Resource referrals recommended: Neuropsychology  Discharge Planning Living Arrangements: Spouse/significant other Support Systems: Spouse/significant other Type of Residence: Private residence Insurance Resources: Multimedia programmer (specify) Nurse, mental health and Garment/textile technologist) Museum/gallery curator Resources: Employment Museum/gallery curator Screen Referred: No Living Expenses: Medical laboratory scientific officer Management: Spouse Does the patient have any problems obtaining your medications?: No Home Management: Pt wife Editor, commissioning all homecare needs Patient/Family Preliminary Plans: TBD Care Coordinator Barriers to Discharge: Decreased caregiver support, Incontinence, New oxygen, Insurance for SNF coverage, Pending chemo/radiation Care Coordinator Anticipated Follow Up Needs: HH/OP  Clinical Impression SW met with pt and pt wife in room to introduce self, explain role, and discuss discharge process. 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 No HCPOA. DME: RW, reacher, cane. Long shoe horn, and suction grab bar in shower.   Jason [name] Jason [name] 10/18/2021, 2:14 PM

## 2021-10-18 NOTE — Progress Notes (Signed)
Inpatient Rehabilitation  Patient information reviewed and entered into eRehab system by Jerolyn Flenniken M. Montravious Weigelt, M.A., CCC/[name], PPS Coordinator.  Information including medical coding, functional ability and quality indicators will be reviewed and updated through discharge.    

## 2021-10-18 NOTE — Plan of Care (Signed)
  Problem: RH Swallowing Goal: LTG Patient will consume least restrictive diet using compensatory strategies with assistance ([name]) Description: LTG:  Patient will consume least restrictive diet using compensatory strategies with assistance ([name]) Flowsheets (Taken 10/18/2021 1044) LTG: Pt Patient will consume least restrictive diet using compensatory strategies with assistance of ([name]): Supervision

## 2021-10-18 NOTE — Evaluation (Addendum)
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 ([place]) Active Problems:   Acute incomplete quadriplegia ([place])   Past Medical History:  Past Medical History:  Diagnosis Date   Allergy    Arthritis    osteoarthritis   GERD (gastroesophageal reflux disease)    Hypertension    Hypothyroidism    Renal calculi    Thyroid disease    Hyperthyroidism s/p radioactive iodine ablation   Past Surgical History:  Past Surgical History:  Procedure Laterality Date   CARDIAC CATHETERIZATION  03/2000   HERNIA REPAIR  9150   umbilical   LEFT HEART CATH AND CORONARY ANGIOGRAPHY Left 03/20/2020   Procedure: LEFT HEART CATH AND CORONARY ANGIOGRAPHY;  Surgeon: Nelva Bush, MD;  Location: [facility];  Service: Cardiovascular;  Laterality: Left;   POSTERIOR CERVICAL FUSION/FORAMINOTOMY N/A 10/06/2021   Procedure: POSTERIOR CERVICAL FUSION/ FORAMINOTOMY LEVEL 3;  Surgeon: Meade Maw, MD;  Location: [facility];  Service: Neurosurgery;  Laterality: N/A;  will need monitoring   RADIOLOGY WITH ANESTHESIA Left 04/18/2021   Procedure: MRI SHOULDER WITHOUT CONTRAST WITH ANESTHESIA;  Surgeon: Radiologist, Medication, MD;  Location: [facility];  Service: Radiology;  Laterality: Left;   RADIOLOGY WITH ANESTHESIA N/A 10/03/2021   Procedure: MRI CERVICAL SPINE WITH ANESTHESIA;  Surgeon: Radiologist, Medication, MD;  Location: [facility];  Service: Radiology;  Laterality: N/A;   REVERSE SHOULDER ARTHROPLASTY Left    ROTATOR CUFF REPAIR  11/2010   Dr [name] Heck    TONSILLECTOMY     TOTAL HIP ARTHROPLASTY Right 02/24/2009   TOTAL HIP ARTHROPLASTY Left 06/2016   Dr Harlow Mares    Assessment & Plan Clinical Impression:  Jason [name] is a 66 year old R handed male with history of HTN, chronic neck pain, OA s/p left RTC repair 04/2021 with ongoing weakness and tingling down to 1st and 2nd digit of left hand which progress to RUE weakness. He was sent to ED for sedation prior to MRI C spine 10/03/21 for work up which showed abnormal marrow edema with diffuse involvement of C4  into thoracic spine, marked canal stenosis greatest at C6, L> R foraminal effacement due to extraosseous disease and concerns of metastatic disease v/s infiltrative marrow neoplastic process. He presented to ED on 10/04/21 for further evaluation and Abdominal pelvic CT done revealing moderate severity retroperitoneal and pelvic lymphadenopathy c/w metastatic disease, small lytic areas L4 and S1, right 2nd and 4th ribs, sacrum and left iliac bone. He was taken to OR on 08/13 by Dr. Cari Caraway for C6 cervical laminectomy for resection of tumor. On am of 08/14, he developed significant BUE/BLE weakness with hypotension requiring transfer to ICU, pressors as well as steroids for BP supported.   He was vented (due for repeat MRI due to severe claustrophobia. MRI C spine revealed similar degree of epidural tumor C4-C7, progressive canal stenosis form C4-C7 without abnormal cord signal or infarct.   MRI head, spine showed calvarial metastatic disease in right temporal region and vertex with intracranial extension of tumor along right frontal convexity without mass effect and extracranial extension of tumor into scalp  along vertex and small focus in right parietal cortex felt to be small acute infarct. MRI thoracic/lumbar spine revealed extensive osseous metastatic disease throughout thoracolumbar spine and pelvis, thin circumferential epidural tumor cervicothoracic junction extending inferiorly and of the  field-of-view to T3-T4, epidural tumor along posterior endplates at T9, G01 and T12, extensive epidural tumor in lumbar spine with greatest thickness at L1 without high-grade stenosis however superimposed on pre-existing degenerative changes in lumbar spine with severe spinal canal stenosis with cauda equina nerve root compression L3-L5, extra-medullary fluid collection along dorsal aspect of cord beginning at T9/T10 extending into lumbar spine likely subdural in location displacing cord and cauda equina nerve roots without compression Or signal abnormality and prevertebral edema and cervical spine which could be postsurgical in nature.  Incidental findings of new bilateral lower lobe consolidation question developing pneumonia v/s aspiration or atelectasis.  He was extubated without difficulty post imaging studies.  .   Dr. 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 He recommended Firmagon loading dose while awaiting final path as well as outpatient PSMA PET scan for full work up. Dr. Donella Stade with Radiation Onc recommended starting palliative XRT to cervical spine but to be delayed till labor day per Dr. Maryann Alar. Dr. Quinn Axe w/neuro also consulted for input on small parietal stroke seen on MRI brain and recommended avoiding hypotension as well as low dose ASA on hgb stabilizes. Palliative care has discussed [place] with wife who will discuss it with patient "when he is able to discuss it" and currently on full scope of treatment. He developed fever with somnolence, acute hypoxic respiratory failure and tachycardia on 08/19 and IV antibiotics added due to concerns of aspiration PNA.  AKI was treated with IVF and he was transfused with one unit PRBC due to drop in Hgb to 7.7.  Oxygen needs decreasing form to 2 L per [place] with improvement in secretions management. Lovenox added for DVT prophylaxis 08/17 and Unasyn transitioned to augmetin today for 5 additional days  of antibiotic coverage. Steroids have been weaned off.    ST evaluation without overt s/s of aspiration and general swallow precautions/implications of fatigue with recs to stop therapy 1 hour before meals to allow to rest for conservation energy for meals-->D3 w/chopped meats and rest breaks during meals and staff assist w/meals. 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 CIR recommended due to functional decline. Patient transferred to CIR on 10/17/2021 .  Patient currently requires total with basic self-care skills and IADL secondary to muscle weakness, muscle joint tightness, and muscle paralysis, decreased cardiorespiratoy endurance, abnormal tone, unbalanced muscle activation, and decreased coordination, and decreased sitting balance, decreased standing balance, decreased postural control, and decreased balance strategies.  Prior to hospitalization, patient could complete all aspects of ADL's and modified work tasks due to failed L shoulder surgery with independent .  Patient will benefit from skilled intervention to decrease level of assist with basic self-care skills, increase independence with basic self-care skills, and increase level of independence with iADL prior to discharge home with care partner.  Anticipate patient will require moderate physical assestance and follow up home health.  OT - End of Session Activity Tolerance: Tolerates < 10 min activity, no significant change in vital signs Endurance Deficit: Yes Endurance Deficit Description: Pt fatigues quickly sitting upright OT Assessment Rehab Potential (ACUTE ONLY): Fair OT  Barriers to Discharge: Home environment access/layout;Neurogenic Bowel & Bladder;Incontinence;Pending chemo/radiation OT Barriers to Discharge Comments: significant assist will be required to care for pt at home, 3 steps to enter, quadruplegia wiht bowel  and bladder deficits OT Patient demonstrates impairments in the following area(s): Balance;Endurance;Motor;Sensory;Edema;Skin Integrity OT Basic ADL's Functional Problem(s): Eating;Grooming;Bathing;Dressing;Toileting OT Advanced ADL's Functional Problem(s): Simple Meal Preparation;Light Housekeeping;Laundry OT Transfers Functional Problem(s): Toilet;Tub/Shower OT Additional Impairment(s): Fuctional Use of Upper Extremity OT Plan OT Intensity: Minimum of 1-2 x/day, 45 to 90 minutes OT Frequency: 5 out of 7 days OT Duration/Estimated Length of Stay: 4 weeks OT Treatment/Interventions: Balance/vestibular training;Community reintegration;Discharge planning;Disease mangement/prevention;DME/adaptive equipment instruction;Functional electrical stimulation;Functional mobility training;Neuromuscular re-education;Pain management;Patient/family education;Psychosocial support;Self Care/advanced ADL retraining;Skin care/wound managment;Splinting/orthotics;Therapeutic Activities;Therapeutic Exercise;UE/LE Strength taining/ROM;UE/LE Coordination activities;Wheelchair propulsion/positioning OT Self Feeding Anticipated Outcome(s): min A OT Basic Self-Care Anticipated Outcome(s): mod-max A OT Toileting Anticipated Outcome(s): max A with caregiver indep OT Bathroom Transfers Anticipated Outcome(s): max A with caregiver indep OT Recommendation Recommendations for Other Services: Therapeutic Recreation consult Therapeutic Recreation Interventions: Pet therapy;Stress management Patient destination: Home Follow Up Recommendations: Home health OT;24 hour supervision/assistance Equipment Recommended: Sliding board;Tub/shower bench;Wheelchair (measurements);Wheelchair cushion (measurements) Equipment Details: lift, DABSC, hospital bed, custom seating and cushion, bathing DME   OT Evaluation Precautions/Restrictions  Precautions Precautions: Fall;Cervical Precaution Comments: C6 laminectomy, MAP  >65 Restrictions Weight Bearing Restrictions: No General Chart Reviewed: Yes Family/Caregiver Present: Yes Vital Signs Therapy Vitals Temp: 98.6 F (37 C) Temp Source: Oral Pulse Rate: 99 Resp: 18 BP: (!) 143/74 Patient Position (if appropriate): Lying Oxygen Therapy SpO2: 97 % O2 Device: Nasal Cannula O2 Flow Rate (L/min): 2 L/min Pain Pain Assessment Pain Scale: 0-10 Pain Score: 0-No pain Home Living/Prior Functioning Home Living Family/patient expects to be discharged to:: Private residence Living Arrangements: Spouse/significant other Available Help at Discharge: Family, Available 24 hours/day Type of Home: House Home Access: Stairs to enter Technical brewer of Steps: 3 Entrance Stairs-Rails: Left Home Layout: One level Bathroom Shower/Tub: Optometrist: Yes  Lives With: Spouse IADL History Homemaking Responsibilities: Yes Meal Prep Responsibility: Secondary Laundry Responsibility: Secondary Bill Paying/Finance Responsibility: Secondary Shopping Responsibility: Secondary Current License: Yes Mode of Transportation: Car Education: college Occupation: Full time employment Type of Occupation: [place] and Hobbies: cats Prior Function Level of Independence: Independent with gait, Independent with transfers  Able to Take Stairs?: Yes Driving: Yes Vocation: Full time employment 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[employer], 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 Vision Baseline Vision/History: 1 Wears glasses Patient Visual Report: No change from baseline Perception  Perception: Within Functional Limits Praxis Praxis: Not tested Cognition Cognition Overall Cognitive Status: Within Functional Limits for tasks assessed Arousal/Alertness: Awake/alert Memory: Impaired Memory Impairment: Retrieval deficit Attention: Sustained Sustained Attention: Appears  intact Awareness: Appears intact Problem Solving: Appears intact Executive Function: Reasoning;Organizing;Decision Making;Self Monitoring Reasoning: Appears intact Organizing: Appears intact Decision Making: Appears intact Self Monitoring: Appears intact Safety/Judgment: Appears intact Brief Interview for Mental Status (BIMS) Repetition of Three Words (First Attempt): 3 Temporal Orientation: Year: Correct Temporal Orientation: Month: Accurate within 5 days Temporal Orientation: Day: Correct Recall: "Sock": Yes, no cue required Recall: "Blue": Yes, no cue required Recall: "Bed": Yes, no cue required BIMS Summary Score: 15 Sensation Sensation Light Touch: Impaired by gross assessment Hot/Cold: Impaired Detail Hot/Cold Impaired Details: Impaired RUE;Impaired LUE Proprioception: Impaired by gross assessment Stereognosis: Impaired by gross assessment Additional Comments: Pt with diminished sensation from lumbar dermatomes down, with patches of absent sensation, + proprio, hot/cold B UE's  Coordination Gross Motor Movements are Fluid and Coordinated: No Fine Motor Movements are Fluid and Coordinated: No Coordination and Movement Description: Pt limited by quadriplegia Finger Nose Finger Test: unable 9 Hole Peg Test: unable Motor  Motor Motor: Clonus;Tetraplegia;Abnormal postural alignment and control Motor - Skilled Clinical Observations: Pt limited by quadriplegia and near constant spasms  Trunk/Postural Assessment  Cervical Assessment Cervical Assessment: Exceptions to [facility] Thoracic Assessment Thoracic Assessment: Exceptions to [facility] Lumbar Assessment Lumbar Assessment: Exceptions to [facility] Postural Control Postural Control: Deficits on evaluation  Balance Balance Balance Assessed: Yes Static Sitting Balance Static Sitting - Balance Support: Feet supported Static Sitting - Level of Assistance: 1: +1 Total assist Dynamic Sitting Balance Sitting balance - Comments: minimal  protective righting reactions noted, although patient is aware when he is not in midline. significant weakness throughout and external support required to maintain mildine sitting balance, at least moderate assistance. emphasis on trunk activation, keeping head, neck, and trunk in midline Extremity/Trunk Assessment RUE Assessment RUE Assessment: Exceptions to [facility] Passive Range of Motion (PROM) Comments: WFL's Active Range of Motion (AROM) Comments: sh 0-90 General Strength Comments: 2+/5 prox 2/5 distal  grossly RUE AROM (degrees) Overall AROM Right Upper Extremity: Deficits RUE Overall AROM Comments: Significant R UE weakness, use of R hand for gross grasp with 1/4 ranges Right Composite Finger Extension: 25% Right Composite Finger Flexion: 25% Right Thumb Opposition: Digit 2;Digit 3 RUE Strength Right Hand Gross Grasp: Impaired RUE Tone RUE Tone: Hypotonic LUE Assessment LUE Assessment: Exceptions to [facility] Passive Range of Motion (PROM) Comments: severely limited L sh due to failed repair with 0-45 degrees PROM sh General Strength Comments: 1/5 L sh, 2-/5 L distal LUE AROM (degrees) Overall AROM Left Upper Extremity: Deficits Left Composite Finger Extension: 25% Left Composite Finger Flexion: 25% Left Thumb Opposition: Digit 2;Digit 3 LUE Strength LUE Overall Strength: Deficits Left Hand Gross Grasp: Impaired LUE Tone LUE Tone: Hypotonic  Care Tool Care Tool Self Care Eating   Eating Assist Level: Maximal Assistance - Patient 25 - 49%    Oral Care    Oral Care Assist Level: Dependent - Patient 0%)    Bathing   Body parts bathed by patient: Face Body parts bathed by helper: Right arm;Left arm;Chest;Abdomen;Front perineal area;Buttocks;Left lower leg;Right lower leg;Left upper leg;Right upper leg   Assist Level: Dependent - Patient 0%    Upper Body Dressing(including orthotics)   What is the patient wearing?: Hospital gown only   Assist Level: Dependent - Patient 0%     Lower Body Dressing (excluding footwear)   What is the patient wearing?: Hospital gown only Assist for lower body dressing: Dependent - Patient 0%    Putting on/Taking off footwear   What is the patient wearing?: Non-skid slipper socks Assist for footwear: Dependent - Patient 0%       Care Tool Toileting Toileting activity   Assist for toileting: Dependent - Patient 0%     Care Tool Bed Mobility Roll left and right activity   Roll left and right assist level: 2 Helpers    Sit to lying activity Sit to lying activity did not occur: Safety/medical concerns Sit to lying assist level: 2 Helpers    Lying to sitting on side of bed activity Lying to sitting on side of bed activity did not occur: the ability to move from lying on the back to sitting on the side of the bed with no back support.: Safety/medical concerns Lying to sitting on side of bed assist level:  the ability to move from lying on the back to sitting on the side of the bed with no back support.: 2 Helpers     Care Tool Transfers Sit to stand transfer Sit to stand activity did not occur: Safety/medical concerns      Chair/bed transfer Chair/bed transfer activity did not occur: Safety/medical concerns       Toilet transfer Toilet transfer activity did not occur: Safety/medical concerns       Care Tool Cognition  Expression of Ideas and Wants Expression of Ideas and Wants: 4. Without difficulty (complex and basic) - expresses complex messages without difficulty and with speech that is clear and easy to understand  Understanding Verbal and Non-Verbal Content Understanding Verbal and Non-Verbal Content: 4. Understands (complex and basic) - clear comprehension without cues or repetitions   Memory/Recall Ability Memory/Recall Ability : Current season;Location of own room;Staff names and faces;That he or she is in a hospital/hospital unit   Refer to Care Plan for [place] 1 OT Short Term  Goal 1 (Week 1): Pt will perform simple self feeding with mod A with AE and compensatory set up OT Short Term Goal 2 (Week 1): Pt will complete UB bathing supported sitting at sink wiht mod A OT Short Term Goal 3 (Week 1): Pt will complete pull over shirt donning with adapted strategies with mod A OT Short Term Goal 4 (Week 1): Pt will perform sitting EOB with mod A for UE simple functional or reaching task  Recommendations for other services: Therapeutic Recreation  Pet therapy and Stress management   Skilled Therapeutic Intervention ADL ADL Equipment Provided: Feeding equipment Eating: Maximal assistance Where Assessed-Eating: Bed level Grooming: Dependent Where Assessed-Grooming: Bed level;Edge of bed Upper Body Bathing: Dependent Where Assessed-Upper Body Bathing: Bed level Lower Body Bathing: Dependent Where Assessed-Lower Body Bathing: Bed level Upper Body Dressing: Dependent Where Assessed-Upper Body Dressing: Bed level Lower Body Dressing: Dependent Where Assessed-Lower Body Dressing: Bed level Toileting: Dependent Where Assessed-Toileting: Bed level Toilet Transfer: Unable to assess Tub/Shower Transfer: Unable to assess ADL Comments: total A for EOB and lift for transfers to TIS, Foley and bowel program, beginning to self feed with built up handle but 10% max of meal with support, face washing with R UE mod A, otherwise Dependent Mobility  Bed Mobility Bed Mobility: Rolling Right;Rolling Left;Sit to Supine;Supine to Sit Rolling Right: Maximal Assistance - Patient 25-49% Rolling Left: Maximal Assistance - Patient 25-49% Supine to Sit: 2 Helpers Sit to Supine: 2 Helpers  Pt seen for full initial OT evaluation and training session this am. Pt in bed upon OT arrival and wife present for entire  OT assisted and assessed ADL's, mobility, vision, sensation. cognition/lang, G/FMC, strength and balance throughout session. See above for levels. Pt with significant subluxed L  shoulder due to failed L sh surgery prior and may need alternative sling support. Pt demonstrated total A for EOB, Foley and bowel program, beginning to self feed with built up handle but 10% max of meal with support, face washing with R UE mod A, otherwise Dependent for all care. B hand splints in place and OT posted signage and entered clarification order to don nightly at bed time daily and remove in AM. Pt will benefit from skilled OT services at CIR to maximize function and safety with recommendation to return home with mod I for BADL's and S for higher level activity with outpt OT services upon d/c home. Pt left at end  of session in recliner with LE's elevated and chair alarm set, tray table and nurse call bell within reach.   Discharge Criteria: Patient will be discharged from OT if patient refuses treatment 3 consecutive times without medical reason, if treatment goals not met, if there is a change in medical status, if patient makes no progress towards goals or if patient is discharged from hospital.  The above assessment, treatment plan, treatment alternatives and goals were discussed and mutually agreed upon: by patient and by family  Barnabas Lister 10/18/2021, 10:29 PM

## 2021-10-18 NOTE — Evaluation (Signed)
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 [facility]) Active Problems:   Acute incomplete quadriplegia ([place])  Past Medical History:  Past Medical History:  Diagnosis Date   Allergy    Arthritis    osteoarthritis   GERD (gastroesophageal reflux disease)    Hypertension    Hypothyroidism    Renal calculi    Thyroid disease    Hyperthyroidism s/p radioactive iodine ablation   Past Surgical History:  Past Surgical History:  Procedure Laterality Date   CARDIAC CATHETERIZATION  03/2000   HERNIA REPAIR  1941   umbilical   LEFT HEART CATH AND CORONARY ANGIOGRAPHY Left 03/20/2020   Procedure: LEFT HEART CATH AND CORONARY ANGIOGRAPHY;  Surgeon: Nelva Bush, MD;  Location: [place] [facility];  Service: Cardiovascular;  Laterality: Left;   POSTERIOR CERVICAL FUSION/FORAMINOTOMY N/A 10/06/2021   Procedure: POSTERIOR CERVICAL FUSION/ FORAMINOTOMY LEVEL 3;  Surgeon: Meade Maw, MD;  Location: [facility];  Service: Neurosurgery;  Laterality: N/A;  will need monitoring   RADIOLOGY WITH ANESTHESIA Left 04/18/2021   Procedure: MRI SHOULDER WITHOUT CONTRAST WITH ANESTHESIA;  Surgeon: Radiologist, Medication, MD;  Location: [place];  Service: Radiology;  Laterality: Left;   RADIOLOGY WITH ANESTHESIA N/A 10/03/2021   Procedure: MRI CERVICAL SPINE WITH ANESTHESIA;  Surgeon: Radiologist, Medication, MD;  Location: [place];  Service: Radiology;  Laterality: N/A;   REVERSE SHOULDER ARTHROPLASTY Left    ROTATOR CUFF REPAIR  11/2010   Dr [name] Heck   TONSILLECTOMY     TOTAL HIP ARTHROPLASTY Right 02/24/2009   TOTAL HIP ARTHROPLASTY Left  06/2016   Dr Harlow Mares    Assessment / Plan / Recommendation Clinical Impression  Pt is a 66 y/o male with h/o hypothyroidism, HTN, GERD, BPH and umbilical hernia repair (1961) who is admitted with new metastatic disease to bone with epidural cord compression suspected from prostate cancer now s/p C6 laminectomy for resection of epidural mass 8/13.  He reports that he had a left shoulder arthroplasty in March of this year.  Prior to that, he was having pain around his shoulder blade and left shoulder.  His shoulder arthroplasty did help somewhat, but he has continued to have left-sided weakness with abduction of his left shoulder and with extension of his arm.  This been ongoing for several months.  He has tingling down his left arm into his first 2 digits of his left hand.  Pt extubated 8/15 post 1 day oral intubation for MRI.  MRI head, spine showed calvarial metastatic disease in right temporal region and vertex with intracranial extension of tumor along right frontal convexity without mass effect and extracranial extension of tumor into scalp along vertex and small focus in right parietal cortex felt to be small acute infarct. Dr. Quinn Axe w/neuro also consulted for input on small parietal stroke seen on MRI brain and recommended avoiding hypotension as well as low dose ASA on hgb stabilizes. Palliative care has discussed [place] with wife who will discuss it with patient "when he is able to discuss it" and currently on full scope of treatment. He developed fever with somnolence, acute hypoxic respiratory failure and tachycardia on 08/19 and IV antibiotics added due to concerns of  aspiration PNA.   Pt presents with apparently functional oropharyngeal swallow for current diet of Dys 3/thin liquids. Pt continues to require/prefer added gravies and sauces to meals to assist with swallow function. Pt was seen by [name] in previous hospital and eventually discharged due to toleration of Dys 3/thin with full supervision and  use of general aspiration precautions. Today, pt observed with Dys 3 texture with functional and timely oral and pharyngeal phase, no overt s/s aspiration or difficulty. Pt consuming thin liquids via straw with no s/s aspiration or change in vocal quality. Pt reports most difficulty with positioning and fatigue during intake however these are improving daily. Pt requires total A for meals due to BUE weakness which increases his aspiration risk. Wife is cleared to assist patient with meals. Recommend continuing Dys 3/thin diet and to initiate regular trials with [name] to determine potential for diet upgrade. Of note, patient has baseline congestion and wet cough at times which wife reports "has been happening for 20 years".   Pt participating in SLUMS assessment with a score of 23/26, adjusted d/t inability to write for clock drawing. Pt only deficits in delayed recall (3/5) and digit reversal (1/2). Pt and wife agree patient is at baseline cognitive function at this time. Wife is present 24/7 for support and supervision. Will follow patient for dysphagia needs only at this time. Pt will benefit from skilled ST during CIR to increase safe consumption of least restrictive diet and train patient and family in effective compensatory strategies for swallow function.     Skilled Therapeutic Interventions          Pt participating in Bedside Swallow Evaluation and SLUMS exam. Please see above.  [name] Assessment  Patient will need skilled Speech Lanaguage Pathology Services during CIR admission    Recommendations  [name] Diet Recommendations: Dysphagia 3 (Mech soft);Thin Liquid Administration via: Cup;Straw Medication Administration: Whole meds with liquid Supervision: Staff to assist with self feeding;Full supervision/cueing for compensatory strategies;Trained caregiver to feed patient Compensations: Minimize environmental distractions;Slow rate;Small sips/bites;Lingual sweep for clearance of pocketing;Multiple dry  swallows after each bite/sip;Follow solids with liquid Postural Changes and/or Swallow Maneuvers: Out of bed for meals;Seated upright 90 degrees;Upright 30-60 min after meal Oral Care Recommendations: Oral care BID;Oral care before and after PO;Staff/trained caregiver to provide oral care Patient destination: Home Follow up Recommendations: None Equipment Recommended: None recommended by [name]    [name] Frequency 3 to 5 out of 7 days   [name] Duration  [name] Intensity  [name] Treatment/Interventions 4 weeks (less for ST)  Minumum of 1-2 x/day, 30 to 90 minutes  Dysphagia/aspiration precaution training;Patient/family education;Therapeutic Activities;Therapeutic Exercise    Pain Pain Assessment Pain Scale: 0-10 Pain Score: 0-No pain  Prior Functioning Cognitive/Linguistic Baseline: Within functional limits Type of Home: House  Lives With: Spouse Available Help at Discharge: Family;Available 24 hours/day Education: college  [name] Evaluation Cognition Overall Cognitive Status: Within Functional Limits for tasks assessed Arousal/Alertness: Awake/alert Orientation Level: Oriented X4 Year: 2023 Month: August Day of Week: Correct Attention: Sustained Sustained Attention: Appears intact Memory: Impaired Memory Impairment: Retrieval deficit Awareness: Appears intact Problem Solving: Appears intact Executive Function: Reasoning;Organizing;Decision Making;Self Monitoring Reasoning: Appears intact Organizing: Appears intact Decision Making: Appears intact Self Monitoring: Appears intact Safety/Judgment: Appears intact  Comprehension Auditory Comprehension Overall Auditory Comprehension: Appears within functional limits for tasks assessed Yes/No Questions: Within Functional Limits Commands: Within Functional Limits Conversation: Complex Expression Expression Primary Mode of Expression: Verbal Verbal Expression Overall Verbal Expression: Appears within functional limits for tasks  assessed Oral Motor Oral Motor/Sensory Function Overall Oral Motor/Sensory Function: Within functional limits Motor Speech Overall Motor Speech: Appears within functional limits for tasks assessed  Care Tool Care Tool Cognition Ability to hear (with hearing aid or hearing appliances if normally used Ability to hear (with hearing aid or hearing appliances if normally used): 0. Adequate - no difficulty in normal conservation, social interaction, listening to TV   Expression of Ideas and Wants Expression of Ideas and Wants: 4. Without difficulty (complex and basic) - expresses complex messages without difficulty and with speech that is clear and easy to understand   Understanding Verbal and Non-Verbal Content Understanding Verbal and Non-Verbal Content: 4. Understands (complex and basic) - clear comprehension without cues or repetitions  Memory/Recall Ability Memory/Recall Ability : Current season;Location of own room;Staff names and faces;That he or she is in a hospital/hospital unit   Bedside Swallowing Assessment General Date of Onset: 10/05/21 Previous Swallow Assessment: BSE at [place] Prior to this Study: Dysphagia 3 (soft);Thin liquids Temperature Spikes Noted: No Respiratory Status: Room air History of Recent Intubation: Yes Length of Intubations (days): 1 days Date extubated: 10/08/21 Behavior/Cognition: Alert;Cooperative;Pleasant mood Oral Cavity - Dentition: Adequate natural dentition Self-Feeding Abilities: Needs assist;Needs set up;Total assist Vision: Functional for self-feeding Patient Positioning: Upright in bed Volitional Cough: Strong Volitional Swallow: Able to elicit  Oral Care Assessment Oral Assessment  (WDL): Within Defined Limits Lips: Symmetrical Teeth: Intact Tongue: Pink;Moist Mucous Membrane(s): Pink;Moist Saliva: Moist, saliva free flowing Level of Consciousness: Alert Is patient on any of following O2 devices?: None of the  above Nutritional status: Dependent feeder Oral Assessment Risk : High Risk Ice Chips Ice chips: Not tested Thin Liquid Thin Liquid: Within functional limits Presentation: Cup;Self Fed;Straw Nectar Thick Nectar Thick Liquid: Not tested Honey Thick Honey Thick Liquid: Not tested Puree Puree: Within functional limits Presentation: Spoon Solid Solid: Within functional limits Presentation: Spoon BSE Assessment Risk for Aspiration Impact on safety and function: Mild aspiration risk;Risk for inadequate nutrition/hydration Other Related Risk Factors: History of GERD;Deconditioning  Short Term Goals: Week 1: [name] Short Term Goal 1 (Week 1): Pt will tolerate Dys 3/thin diet with no overt s/s aspiration or dysphagia given Supervision A verbal cues for safe swallow strategies [name] Short Term Goal 2 (Week 1): Pt will trial regular solids with no overt s/s aspiration or dysphagia given Supervision A verbal cues to determine potential for diet upgrade  Refer to Care Plan for Long Term Goals  Recommendations for other services: None   Discharge Criteria: Patient will be discharged from [name] if patient refuses treatment 3 consecutive times without medical reason, if treatment goals not met, if there is a change in medical status, if patient makes no progress towards goals or if patient is discharged from hospital.  The above assessment, treatment plan, treatment alternatives and goals were discussed and mutually agreed upon: by patient and by family  Dewaine Conger 10/18/2021, 1:01 PM

## 2021-10-18 NOTE — Progress Notes (Signed)
Bilateral lower extremity venous duplex has been completed. Preliminary results can be found in CV Proc through chart review.  Results were given to the patient's nurse, Danae Chen.  10/18/21 2:54 PM Jason [name] RVT

## 2021-10-18 NOTE — Progress Notes (Addendum)
Bowel program performed this shift per MD order Patient had a medium bowel movement see flow sheet for details. Notified oncoming nurse    Yehuda Mao, LPN

## 2021-10-18 NOTE — H&P (Signed)
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         HPI: Jason [name] is a 66 year old R handed male with history of HTN, chronic neck pain, OA s/p left RTC repair 04/2021 with ongoing weakness and tingling down to 1st and 2nd digit of left hand which progress to RUE weakness. He was sent to ED for sedation prior to MRI C spine 10/03/21 for work up which showed abnormal marrow edema with diffuse involvement of C4  into thoracic spine, marked canal stenosis greatest at C6, L> R foraminal effacement due to extraosseous disease and concerns of metastatic disease v/s infiltrative marrow neoplastic process. He presented to ED on 10/04/21 for further evaluation and Abdominal pelvic CT done revealing moderate severity retroperitoneal and pelvic lymphadenopathy c/w metastatic disease, small lytic areas L4 and S1, right 2nd and 4th ribs, sacrum and left iliac bone. He was taken to OR on 08/13 by Dr. Cari Caraway for C6 cervical laminectomy for resection of tumor. On am of 08/14, he developed significant BUE/BLE weakness with hypotension requiring transfer to ICU, pressors as well as steroids for BP supported.   He was vented (due for repeat MRI due to severe claustrophobia. MRI C spine revealed similar degree of epidural tumor C4-C7, progressive canal stenosis form C4-C7 without abnormal cord signal or infarct.   MRI head, spine showed calvarial metastatic disease in right temporal region and vertex with intracranial extension of tumor along right frontal convexity without mass effect and extracranial extension of tumor into scalp along vertex and small focus in right parietal cortex felt to be small acute infarct. MRI thoracic/lumbar spine revealed extensive osseous metastatic disease throughout thoracolumbar spine and pelvis, thin circumferential  epidural tumor cervicothoracic junction extending inferiorly and of the field-of-view to T3-T4, epidural tumor along posterior endplates at T9, Z16 and T12, extensive epidural tumor in lumbar spine with greatest thickness at L1 without high-grade stenosis however superimposed on pre-existing degenerative changes in lumbar spine with severe spinal canal stenosis with cauda equina nerve root compression L3-L5, extra-medullary fluid collection along dorsal aspect of cord beginning at T9/T10 extending into lumbar spine likely subdural in location displacing cord and cauda equina nerve roots without compression Or signal abnormality and prevertebral edema and cervical spine which could be postsurgical in nature.  Incidental findings of new bilateral lower lobe consolidation question developing pneumonia v/s aspiration or atelectasis.  He was extubated without difficulty post imaging studies.  .   Dr. 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 He recommended Firmagon loading dose while awaiting final path as well as outpatient PSMA PET scan for full work up. Dr. Donella Stade with Radiation Onc recommended starting palliative XRT to cervical spine but to be delayed till labor day per Dr. Maryann Alar. Dr. Quinn Axe w/neuro also consulted for input on small parietal stroke seen on MRI brain and recommended avoiding hypotension as well as low dose ASA on hgb stabilizes. Palliative care has discussed [place] with wife who will discuss it with patient "when he is able to discuss it" and currently on full scope of treatment. He developed fever with somnolence, acute hypoxic respiratory failure and tachycardia on 08/19 and IV antibiotics added due to concerns of aspiration PNA.  AKI was treated with IVF and he  was transfused with one unit PRBC due to drop in Hgb to 7.7.  Oxygen needs decreasing form to 2 L per [place] with improvement in secretions management. Lovenox added for DVT prophylaxis  08/17 and Unasyn transitioned to augmetin today for 5 additional days of antibiotic coverage. Steroids have been weaned off.    ST evaluation without overt s/s of aspiration and general swallow precautions/implications of fatigue with recs to stop therapy 1 hour before meals to allow to rest for conservation energy for meals-->D3 w/chopped meats and rest breaks during meals and staff assist w/meals. 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 CIR recommended due to functional decline.    Plans for XRT simulation on 10/24/21@ 2 pm by Dr. Tammi Klippel   Pt reports started bowel program last night- went OK- feeling less constipated.  Sleeping well.  Having a lot of RLE> LLE spasms but not painful. Not having a lot of pain.  Breathing better since yesterday.  Has foley since unable to void.  Anxious about therapy.        Review of Systems  Constitutional:  Negative for chills and fever.  HENT:  Negative for hearing loss.   Eyes:  Negative for blurred vision.  Respiratory:  Positive for cough and sputum production. Negative for shortness of breath.   Cardiovascular:  Negative for chest pain and palpitations.  Gastrointestinal:  Negative for constipation, heartburn and nausea.       Incontinent of bowel.--just comes out.   Musculoskeletal:  Positive for joint pain. Negative for myalgias.  Skin:  Negative for rash.  Neurological:  Positive for speech change and focal weakness. Negative for dizziness, sensory change and headaches.  Psychiatric/Behavioral:  The patient is nervous/anxious.   All other systems reviewed and are negative.           Past Medical History:  Diagnosis Date   Allergy     Arthritis      osteoarthritis   GERD (gastroesophageal reflux disease)     Hypertension     Hypothyroidism     Renal calculi     Thyroid disease      Hyperthyroidism s/p radioactive iodine  ablation           Past Surgical History:  Procedure Laterality Date   CARDIAC CATHETERIZATION   03/2000   HERNIA REPAIR   7062    umbilical   LEFT HEART CATH AND CORONARY ANGIOGRAPHY Left 03/20/2020    Procedure: LEFT HEART CATH AND CORONARY ANGIOGRAPHY;  Surgeon: Nelva Bush, MD;  Location: [facility];  Service: Cardiovascular;  Laterality: Left;   POSTERIOR CERVICAL FUSION/FORAMINOTOMY N/A 10/06/2021    Procedure: POSTERIOR CERVICAL FUSION/ FORAMINOTOMY LEVEL 3;  Surgeon: Meade Maw, MD;  Location: [facility];  Service: Neurosurgery;  Laterality: N/A;  will need monitoring   RADIOLOGY WITH ANESTHESIA Left 04/18/2021    Procedure: MRI SHOULDER WITHOUT CONTRAST WITH ANESTHESIA;  Surgeon: Radiologist, Medication, MD;  Location: [facility];  Service: Radiology;  Laterality: Left;   RADIOLOGY WITH ANESTHESIA N/A 10/03/2021    Procedure: MRI CERVICAL SPINE WITH ANESTHESIA;  Surgeon: Radiologist, Medication, MD;  Location: [facility];  Service: Radiology;  Laterality: N/A;   REVERSE SHOULDER ARTHROPLASTY Left     ROTATOR CUFF REPAIR   11/2010    Dr [name] Heck   TONSILLECTOMY       TOTAL HIP ARTHROPLASTY Right 02/24/2009   TOTAL HIP  ARTHROPLASTY Left 06/2016    Dr Harlow Mares           Family History  Problem Relation Age of Onset   Hypertension Mother     Stroke Mother     Gout Father     Heart attack Father 27   Heart disease Sister     Colon cancer Neg Hx     Esophageal cancer Neg Hx     Rectal cancer Neg Hx     Stomach cancer Neg Hx        Social History: Married. 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 He  reports that he has never smoked. He has never used smokeless tobacco. He reports that he does not currently use alcohol. He reports that he does not use drugs.     Allergies: No Known Allergies           Medications Prior to Admission  Medication Sig Dispense Refill   acetaminophen (TYLENOL) 500 MG tablet Take 1,000 mg by mouth in  the morning, at noon, and at bedtime.       Ascorbic Acid (VITA-C PO) Take 1,000 mg by mouth daily.       cetirizine (ZYRTEC) 10 MG tablet Take 10 mg by mouth at bedtime.       CRANBERRY PO Take 500 mg by mouth at bedtime.       cyclobenzaprine (FLEXERIL) 10 MG tablet TAKE 1 TABLET BY MOUTH EVERY DAY AT NIGHT AS NEEDED (Patient taking differently: Take 10 mg by mouth at bedtime. TAKE 1 TABLET BY MOUTH EVERY DAY AT NIGHT AS NEEDED) 90 tablet 1   levothyroxine (SYNTHROID) 88 MCG tablet TAKE 1 TABLET BY MOUTH EVERY DAY (Patient taking differently: Take 88 mcg by mouth daily before breakfast.) 90 tablet 3   loratadine (CLARITIN) 10 MG tablet Take 10 mg by mouth in the morning and at bedtime.       meloxicam (MOBIC) 15 MG tablet Take 1 tablet (15 mg total) by mouth daily. 7 tablet 0   Multiple Vitamin (MULTIVITAMIN) tablet Take 1 tablet by mouth daily.       potassium chloride (KLOR-CON) 10 MEQ tablet TAKE 1 TABLET BY MOUTH EVERY DAY (Patient taking differently: Take 10 mEq by mouth at bedtime.) 90 tablet 0   rosuvastatin (CRESTOR) 5 MG tablet TAKE 1 TABLET (5 MG TOTAL) BY MOUTH DAILY. 90 tablet 2   spironolactone (ALDACTONE) 25 MG tablet TAKE 1/2 TABLET BY MOUTH EVERY DAY 45 tablet 2   tamsulosin (FLOMAX) 0.4 MG CAPS capsule TAKE 1 CAPSULE BY MOUTH EVERY DAY 90 capsule 3   torsemide (DEMADEX) 10 MG tablet TAKE 1 TABLET BY MOUTH EVERY DAY 90 tablet 1   [DISCONTINUED] lisinopril (ZESTRIL) 20 MG tablet TAKE 1 TABLET BY MOUTH EVERY DAY 90 tablet 1   [DISCONTINUED] metoprolol succinate (TOPROL-XL) 100 MG 24 hr tablet TAKE 1 TABLET BY MOUTH EVERY DAY WITH OR IMMEDIATELY FOLLOWING A MEAL (Patient taking differently: Take 100 mg by mouth daily.) 90 tablet 1   amoxicillin (AMOXIL) 500 MG capsule Take 2,000 mg by mouth See admin instructions. Take 4 tablets by mouth prior to dental procedures. (Patient not taking: Reported on 10/05/2021)       Menthol, Topical Analgesic, (ICY HOT) 5 % PTCH Apply 1-2 patches  topically daily as needed (pain).       oxyCODONE-acetaminophen (PERCOCET/ROXICET) 5-325 MG tablet Take 1 tablet by mouth every 4 (four) hours as needed for severe pain. 30 tablet 0  sildenafil (REVATIO) 20 MG tablet Take 3-5 tablets (60-100 mg total) by mouth daily as needed. 50 tablet 11          Home: Home Living Family/patient expects to be discharged to:: Private residence Living Arrangements: Spouse/significant other Available Help at Discharge: Family, Available 24 hours/day (wife works remote for [employer]) Type of Home: [employer] Home Access: Stairs to enter Technical brewer of Steps: 3 Entrance Stairs-Rails: Left [place]: One level Bathroom Shower/Tub: Chiropodist: Standard Bathroom Accessibility: Yes Home Equipment: Conservation officer, nature (2 wheels), [employer] - single point, Other (comment), Grab bars - tub/shower  Lives With: Spouse   Functional History: Prior Function Prior Level of Function : Independent/Modified Independent Mobility Comments: indep no AD, no falls ADLs Comments: indep ADL/IADL, works at [employer] improvement   Functional Status:  Mobility: Bed Mobility Overal bed mobility: Needs Assistance Bed Mobility: Supine to Sit, Sit to Supine, Rolling Rolling: Total assist, +2 for physical assistance Supine to sit: Total assist, +2 for physical assistance Sit to supine: Total assist, +2 for physical assistance General bed mobility comments: verbal cues for technique to reach across midline to facilitate rolling and for right elbow extension to promote upright trunk position. patient continues to require +2 person assistance for mobility efforts. no significant pain reported with activity. Transfers General transfer comment: unsafe to attempt due to severity of weakness. would need a lift for OOB activity at this time   ADL: ADL Overall ADL's : Needs assistance/impaired Eating/Feeding: Maximal assistance, Bed level Grooming: Wash/dry  face, Maximal assistance, Bed level Upper Body Dressing : Maximal assistance Upper Body Dressing Details (indicate cue type and reason): anticipated Lower Body Dressing: Total assistance Lower Body Dressing Details (indicate cue type and reason): at edge of bed Toileting- Clothing Manipulation and Hygiene: Maximal assistance, Total assistance Toileting - Clothing Manipulation Details (indicate cue type and reason): pt with foley at this time, MAX-TOTAL A for peri care Functional mobility during ADLs: Maximal assistance, Total assistance, +2 for physical assistance, +2 for safety/equipment General ADL Comments: TOTAL A x2 pericare bed level. SETUP + MAX A face washing and nose blowing in sitting, assist for sitting balance.   Cognition: Cognition Overall Cognitive Status: Within Functional Limits for tasks assessed Orientation Level: Oriented X4 Cognition Arousal/Alertness: Awake/alert Behavior During Therapy: WFL for tasks assessed/performed Overall Cognitive Status: Within Functional Limits for tasks assessed General Comments: good insight into deficits, follows commands     Blood pressure 139/76, pulse 90, temperature 98.7 F (37.1 C), temperature source Oral, resp. rate 16, height '5\' 9"'$  (1.753 m), weight 99.8 kg, SpO2 95 %. Physical Exam Vitals and nursing note reviewed.  Constitutional:      General: He is not in acute distress.    Appearance: Normal appearance. He is obese.     Comments: Sitting up in bed with soft call bell by R hand- propped on pillows; appears stated age; awake, alert, wearing 2L O2 by [place], NAD  HENT:     Head: Normocephalic and atraumatic.     Comments: No facial droop seen    Right Ear: External ear normal.     Left Ear: External ear normal.     Nose: Nose normal. No congestion.     Mouth/Throat:     Mouth: Mucous membranes are dry.     Pharynx: Oropharynx is clear. No oropharyngeal exudate.  Eyes:     General:        Right eye: No discharge.  Left eye: No discharge.     Extraocular Movements: Extraocular movements intact.  Neck:     Comments: Posterior neck incision C/D/I with staples in place. Some underlying edema with surrounding resolving ecchymosis.  Cardiovascular:     Rate and Rhythm: Normal rate and regular rhythm.     Heart sounds: Normal heart sounds. No murmur heard.    No gallop.  Pulmonary:     Comments: Decreased at bases B/L- no W/R/R this AM; good air movement in upper lobes Abdominal:     General: Bowel sounds are normal. There is no distension.     Palpations: Abdomen is soft.     Tenderness: There is no abdominal tenderness.  Genitourinary:    Comments: Medium amber urine in foley Musculoskeletal:     Cervical back: Neck supple. Tenderness present.     Comments: RUE- biceps 5/5; WE 4-/5; Triceps 2+/5; Grip 3-/5; FA 2-/5 LUE- biceps 5-/5; WE 4/5; Triceps 2+/5' Grip 3+/5; FA 2-/5 RLE- 0/5 in RLE LLE- HF 1/5; KE 2-/5; DF 1/5; PF 2-/5; EHL 1/5   Skin:    General: Skin is warm and dry.  Neurological:     Mental Status: He is alert.     Comments: Soft voice with weak cough. Decreased postural control --listing to the right and neck with decreased ROM. Able to passively get to midline without discomfort.    Decreased to light touch at L1 down to S1 B/L- unable to check S2-S5 today Almost constant spasms of RLE during exam; a few spasms seen on LLE Clonus sustained on RLE; and a few beats on LLE Hoffman's (+) B/L Ue's;  Ox3- but very slightly delayed on responses  Psychiatric:        Mood and Affect: Mood normal.        Behavior: Behavior normal.        Lab Results Last 48 Hours        Results for orders placed or performed during the hospital encounter of 10/04/21 (from the past 48 hour(s))  Glucose, capillary     Status: None    Collection Time: 10/15/21  4:42 PM  Result Value Ref Range    Glucose-Capillary 93 70 - 99 mg/dL      Comment: Glucose reference range applies only to samples taken  after fasting for at least 8 hours.  Glucose, capillary     Status: Abnormal    Collection Time: 10/15/21  7:25 PM  Result Value Ref Range    Glucose-Capillary 58 (L) 70 - 99 mg/dL      Comment: Glucose reference range applies only to samples taken after fasting for at least 8 hours.  Glucose, capillary     Status: None    Collection Time: 10/15/21  7:49 PM  Result Value Ref Range    Glucose-Capillary 71 70 - 99 mg/dL      Comment: Glucose reference range applies only to samples taken after fasting for at least 8 hours.  Glucose, capillary     Status: Abnormal    Collection Time: 10/15/21  9:44 PM  Result Value Ref Range    Glucose-Capillary 125 (H) 70 - 99 mg/dL      Comment: Glucose reference range applies only to samples taken after fasting for at least 8 hours.  Glucose, capillary     Status: Abnormal    Collection Time: 10/15/21 11:39 PM  Result Value Ref Range    Glucose-Capillary 118 (H) 70 - 99 mg/dL  Comment: Glucose reference range applies only to samples taken after fasting for at least 8 hours.  Glucose, capillary     Status: None    Collection Time: 10/16/21  4:10 AM  Result Value Ref Range    Glucose-Capillary 96 70 - 99 mg/dL      Comment: Glucose reference range applies only to samples taken after fasting for at least 8 hours.  Glucose, capillary     Status: Abnormal    Collection Time: 10/16/21  8:31 AM  Result Value Ref Range    Glucose-Capillary 103 (H) 70 - 99 mg/dL      Comment: Glucose reference range applies only to samples taken after fasting for at least 8 hours.  Glucose, capillary     Status: Abnormal    Collection Time: 10/16/21 11:57 AM  Result Value Ref Range    Glucose-Capillary 117 (H) 70 - 99 mg/dL      Comment: Glucose reference range applies only to samples taken after fasting for at least 8 hours.  Comprehensive metabolic panel     Status: Abnormal    Collection Time: 10/16/21  3:17 PM  Result Value Ref Range    Sodium 136 135 - 145  mmol/L    Potassium 3.7 3.5 - 5.1 mmol/L    Chloride 107 98 - 111 mmol/L    CO2 23 22 - 32 mmol/L    Glucose, Bld 144 (H) 70 - 99 mg/dL      Comment: Glucose reference range applies only to samples taken after fasting for at least 8 hours.    BUN 16 8 - 23 mg/dL    Creatinine, Ser 0.52 (L) 0.61 - 1.24 mg/dL    Calcium 7.8 (L) 8.9 - 10.3 mg/dL    Total Protein 5.5 (L) 6.5 - 8.1 g/dL    Albumin 2.7 (L) 3.5 - 5.0 g/dL    AST 76 (H) 15 - 41 U/L    ALT 112 (H) 0 - 44 U/L    Alkaline Phosphatase 315 (H) 38 - 126 U/L    Total Bilirubin 1.0 0.3 - 1.2 mg/dL    GFR, Estimated >60 >60 mL/min      Comment: (NOTE) Calculated using the CKD-EPI Creatinine Equation (2021)      Anion gap 6 5 - 15      Comment: Performed at [facility], [address] [place], [place] [address]  Glucose, capillary     Status: Abnormal    Collection Time: 10/16/21  5:01 PM  Result Value Ref Range    Glucose-Capillary 125 (H) 70 - 99 mg/dL      Comment: Glucose reference range applies only to samples taken after fasting for at least 8 hours.  Glucose, capillary     Status: Abnormal    Collection Time: 10/16/21  7:34 PM  Result Value Ref Range    Glucose-Capillary 114 (H) 70 - 99 mg/dL      Comment: Glucose reference range applies only to samples taken after fasting for at least 8 hours.  Glucose, capillary     Status: Abnormal    Collection Time: 10/16/21 11:38 PM  Result Value Ref Range    Glucose-Capillary 118 (H) 70 - 99 mg/dL      Comment: Glucose reference range applies only to samples taken after fasting for at least 8 hours.  Glucose, capillary     Status: None    Collection Time: 10/17/21  3:20 AM  Result Value Ref Range    Glucose-Capillary  91 70 - 99 mg/dL      Comment: Glucose reference range applies only to samples taken after fasting for at least 8 hours.  Glucose, capillary     Status: Abnormal    Collection Time: 10/17/21  7:55 AM  Result Value Ref Range    Glucose-Capillary 106 (H)  70 - 99 mg/dL      Comment: Glucose reference range applies only to samples taken after fasting for at least 8 hours.      Imaging Results (Last 48 hours)  No results found.         Blood pressure 139/76, pulse 90, temperature 98.7 F (37.1 C), temperature source Oral, resp. rate 16, height '5\' 9"'$  (1.753 m), weight 99.8 kg, SpO2 95 %.   Medical Problem List and Plan: 1. 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  Antithrombotics: -DVT/anticoagulation:  Pharmaceutical: Lovenox. Check dopplers in am.              -antiplatelet therapy: ASA 3. Pain Management: Oxycodone prn.  4. Mood/Behavior/Sleep: LCSW to follow for evaluation and support.              -antipsychotic agents: N/A 5. Neuropsych/cognition: This patient is capable of making decisions on his own behalf. 6. Skin/Wound Care: Routine pressure relief measures              --monitor incision for healing.  7. Fluids/Electrolytes/Nutrition: Monitor I/O. Check CMET in am.  8. Prostate AdenoCA w/mets: Loading dose Firmagon 240 mg 10/06/21 per Dr. Tasia Catchings --Charlesetta Garibaldi changed to [facility] due to insurance coverage--next dose around 09/13. --Simulation for XRT 09/31 at 2 pm at [facility] 9. Dysphagia: Continue D3 w/chopped meats and assist at meals. Aspiration precautions.  10. Neurogenic bladder: continue foley for now 11. Neurogenic bowel: Will get KUB to determine stool burden --Colace in am with suppository after supper.  12. Sepsis/Likely aspiration PNA: Now on Augmentin--antibiotic D#5/10. BC pend-->neg so far.              --Follow up CXR in am.              --pulmonary hygiene for atelectasis/Asymmetric volume loss on right.  13. Abnormal LFTs: Improved overall except for elevation in Aphos--315. Will recheck in am 14. ABLA: Recheck in am 15. Leucocytosis: Recheck in am.  Monitor for fevers/other signs of infection.   16. Hypotension: Will continue midodrine for now.      I have personally performed a face to face diagnostic evaluation of this patient and formulated the key components of the plan.  Additionally, I have personally reviewed laboratory data, imaging studies, as well as relevant notes and concur with the physician assistant's documentation above.   The patient's status has not changed from the original H&P.  Any changes in documentation from the acute care chart have been noted above.       Bary Leriche, PA-C 10/17/2021

## 2021-10-18 NOTE — Progress Notes (Signed)
ANTICOAGULATION CONSULT NOTE - Initial Consult  Pharmacy Consult for Lovenox Indication: DVT  No Known Allergies  Patient Measurements: Height: '5\' 9"'$  (175.3 cm) Weight: 99.9 kg (220 lb 3.8 oz) IBW/kg (Calculated) : 70.7 Heparin Dosing Weight:   Vital Signs: Temp: 97.7 F (36.5 C) (08/25 1320) Temp Source: Oral (08/25 1320) BP: 135/71 (08/25 1320) Pulse Rate: 92 (08/25 1500)  Labs: Recent Labs    10/16/21 1517 10/18/21 0640  HGB  --  8.4*  HCT  --  25.3*  PLT  --  319  CREATININE 0.52* 0.51*    Estimated Creatinine Clearance: 110.2 mL/min (A) (by C-G formula based on SCr of 0.51 mg/dL (L)).   Medical History: Past Medical History:  Diagnosis Date   Allergy    Arthritis    osteoarthritis   GERD (gastroesophageal reflux disease)    Hypertension    Hypothyroidism    Renal calculi    Thyroid disease    Hyperthyroidism s/p radioactive iodine ablation    Medications:  Medications Prior to Admission  Medication Sig Dispense Refill Last Dose   acetaminophen (TYLENOL) 500 MG tablet Take 1,000 mg by mouth in the morning, at noon, and at bedtime.      amoxicillin-clavulanate (AUGMENTIN) 875-125 MG tablet Take 1 tablet by mouth every 12 (twelve) hours for 5 days. 10 tablet 0    Ascorbic Acid (VITA-C PO) Take 1,000 mg by mouth daily.      aspirin EC 81 MG tablet Take 1 tablet (81 mg total) by mouth daily. Swallow whole. 30 tablet 12    atorvastatin (LIPITOR) 40 MG tablet Take 1 tablet (40 mg total) by mouth daily. 30 tablet 0    cetirizine (ZYRTEC) 10 MG tablet Take 10 mg by mouth at bedtime.      clonazePAM (KLONOPIN) 0.5 MG tablet Take 1 tablet (0.5 mg total) by mouth 2 (two) times daily. 30 tablet 0    CRANBERRY PO Take 500 mg by mouth at bedtime.      cyclobenzaprine (FLEXERIL) 10 MG tablet TAKE 1 TABLET BY MOUTH EVERY DAY AT NIGHT AS NEEDED (Patient taking differently: Take 10 mg by mouth at bedtime. TAKE 1 TABLET BY MOUTH EVERY DAY AT NIGHT AS NEEDED) 90 tablet 1     docusate sodium (COLACE) 100 MG capsule Take 1 capsule (100 mg total) by mouth 2 (two) times daily. 10 capsule 0    FLUoxetine (PROZAC) 20 MG capsule Take 1 capsule (20 mg total) by mouth at bedtime. 30 capsule 3    iron polysaccharides (NIFEREX) 150 MG capsule Take 1 capsule (150 mg total) by mouth daily. 30 capsule 3    levothyroxine (SYNTHROID) 88 MCG tablet TAKE 1 TABLET BY MOUTH EVERY DAY (Patient taking differently: Take 88 mcg by mouth daily before breakfast.) 90 tablet 3    lisinopril (ZESTRIL) 5 MG tablet Take 1 tablet (5 mg total) by mouth daily. 30 tablet 0    loratadine (CLARITIN) 10 MG tablet Take 10 mg by mouth in the morning and at bedtime.      meloxicam (MOBIC) 15 MG tablet Take 1 tablet (15 mg total) by mouth daily. 7 tablet 0    metoprolol succinate (TOPROL-XL) 25 MG 24 hr tablet Take 1 tablet (25 mg total) by mouth daily. Take with or immediately following a meal. 30 tablet 1    Multiple Vitamin (MULTIVITAMIN) tablet Take 1 tablet by mouth daily.      oxyCODONE-acetaminophen (PERCOCET/ROXICET) 5-325 MG tablet Take 1 tablet by mouth every  4 (four) hours as needed for severe pain. 30 tablet 0    potassium chloride (KLOR-CON) 10 MEQ tablet TAKE 1 TABLET BY MOUTH EVERY DAY (Patient taking differently: Take 10 mEq by mouth at bedtime.) 90 tablet 0    sodium chloride (OCEAN) 0.65 % SOLN nasal spray Place 1 spray into both nostrils as needed for congestion. 15 mL 0    spironolactone (ALDACTONE) 25 MG tablet TAKE 1/2 TABLET BY MOUTH EVERY DAY 45 tablet 2    tamsulosin (FLOMAX) 0.4 MG CAPS capsule TAKE 1 CAPSULE BY MOUTH EVERY DAY 90 capsule 3    Scheduled:   (feeding supplement) PROSource Plus  30 mL Oral TID BM   amoxicillin-clavulanate  1 tablet Oral Q12H   ascorbic acid  500 mg Oral Daily   aspirin EC  81 mg Oral Daily   atorvastatin  40 mg Oral Daily   bisacodyl  10 mg Rectal QPC supper   budesonide (PULMICORT) nebulizer solution  0.5 mg Nebulization BID   Chlorhexidine  Gluconate Cloth  6 each Topical BID   clonazePAM  0.5 mg Oral BID   dextromethorphan-guaiFENesin  1 tablet Oral BID   docusate sodium  200 mg Oral Daily   enoxaparin (LOVENOX) injection  100 mg Subcutaneous BID   famotidine  20 mg Oral Daily   FLUoxetine  20 mg Oral QHS   ipratropium-albuterol  3 mL Nebulization TID   iron polysaccharides  150 mg Oral Daily   levothyroxine  88 mcg Oral Q0600   loratadine  10 mg Oral BID   meloxicam  15 mg Oral Daily   midodrine  5 mg Oral TID WC   multivitamin with minerals  1 tablet Oral Daily   potassium chloride  10 mEq Oral QHS   tamsulosin  0.4 mg Oral QPC supper   Infusions:   Assessment: Pt has been on a modified dose of Lovenox but doppler showed new DVT today. Change to full dose lovenox.   Scr <1 Hgb  8.4, plt wnl  Goal of Therapy:  Anti-Xa level 0.6-1 units/ml 4hrs after LMWH dose given Monitor platelets by anticoagulation protocol: Yes   Plan:   Lovenox '100mg'$  SQ BID CBC q72 hr F/u with PO AC  Onnie Boer, PharmD, BCIDP, AAHIVP, CPP Infectious Disease Pharmacist 10/18/2021 4:01 PM

## 2021-10-18 NOTE — Evaluation (Signed)
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 [facility]) Active Problems:   Acute incomplete quadriplegia ([place])   Past Medical History:  Past Medical History:  Diagnosis Date   Allergy    Arthritis    osteoarthritis   GERD (gastroesophageal reflux disease)    Hypertension    Hypothyroidism    Renal calculi    Thyroid disease    Hyperthyroidism s/p radioactive iodine ablation   Past Surgical History:  Past Surgical History:  Procedure Laterality Date   CARDIAC CATHETERIZATION  03/2000   HERNIA REPAIR  0354   umbilical   LEFT HEART CATH AND CORONARY ANGIOGRAPHY Left 03/20/2020   Procedure: LEFT HEART CATH AND CORONARY ANGIOGRAPHY;  Surgeon: Nelva Bush, MD;  Location: [facility];  Service: Cardiovascular;  Laterality: Left;   POSTERIOR CERVICAL FUSION/FORAMINOTOMY N/A 10/06/2021   Procedure: POSTERIOR CERVICAL FUSION/ FORAMINOTOMY LEVEL 3;  Surgeon: Meade Maw, MD;  Location: [facility];  Service: Neurosurgery;  Laterality: N/A;  will need monitoring   RADIOLOGY WITH ANESTHESIA Left 04/18/2021   Procedure: MRI SHOULDER WITHOUT CONTRAST WITH ANESTHESIA;  Surgeon: Radiologist, Medication, MD;  Location: [facility];  Service: Radiology;  Laterality: Left;   RADIOLOGY WITH ANESTHESIA N/A 10/03/2021   Procedure: MRI CERVICAL SPINE WITH ANESTHESIA;  Surgeon: Radiologist, Medication, MD;  Location: [facility];  Service: Radiology;  Laterality: N/A;   REVERSE SHOULDER ARTHROPLASTY Left    ROTATOR CUFF  REPAIR  11/2010   Dr [name] Heck   TONSILLECTOMY     TOTAL HIP ARTHROPLASTY Right 02/24/2009   TOTAL HIP ARTHROPLASTY Left 06/2016   Dr Harlow Mares    Assessment & Plan Clinical Impression:  Jason [name] is a 66 year old R handed male with history of HTN, chronic neck pain, OA s/p left RTC repair 04/2021 with ongoing weakness and tingling down to 1st and 2nd digit of left hand which progress to RUE weakness. He was sent to ED for sedation prior to MRI C spine 10/03/21 for work up which showed abnormal marrow edema with diffuse involvement of C4  into thoracic spine, marked canal stenosis greatest at C6, L> R foraminal effacement due to extraosseous disease and concerns of metastatic disease v/s infiltrative marrow neoplastic process. He presented to ED on 10/04/21 for further evaluation and Abdominal pelvic CT done revealing moderate severity retroperitoneal and pelvic lymphadenopathy c/w metastatic disease, small lytic areas L4 and S1, right 2nd and 4th ribs, sacrum and left iliac bone. He was taken to OR on 08/13 by Dr. Cari Caraway for C6 cervical laminectomy for resection of tumor. On am of 08/14, he developed significant BUE/BLE weakness with hypotension requiring transfer to ICU, pressors as well as steroids for BP supported.   He was vented (due for repeat MRI due to severe claustrophobia. MRI C spine revealed similar degree of epidural tumor C4-C7, progressive canal stenosis form C4-C7 without abnormal cord signal or infarct.   MRI head, spine showed calvarial metastatic disease in right temporal region and vertex with intracranial extension of tumor along right frontal convexity without mass effect and extracranial extension of tumor into scalp along vertex  and small focus in right parietal cortex felt to be small acute infarct. MRI thoracic/lumbar spine revealed extensive osseous metastatic disease throughout thoracolumbar spine and pelvis, thin circumferential epidural tumor cervicothoracic junction  extending inferiorly and of the field-of-view to T3-T4, epidural tumor along posterior endplates at T9, J69 and T12, extensive epidural tumor in lumbar spine with greatest thickness at L1 without high-grade stenosis however superimposed on pre-existing degenerative changes in lumbar spine with severe spinal canal stenosis with cauda equina nerve root compression L3-L5, extra-medullary fluid collection along dorsal aspect of cord beginning at T9/T10 extending into lumbar spine likely subdural in location displacing cord and cauda equina nerve roots without compression Or signal abnormality and prevertebral edema and cervical spine which could be postsurgical in nature.  Incidental findings of new bilateral lower lobe consolidation question developing pneumonia v/s aspiration or atelectasis.  He was extubated without difficulty post imaging studies.  .   Dr. 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 He recommended Firmagon loading dose while awaiting final path as well as outpatient PSMA PET scan for full work up. Dr. Donella Stade with Radiation Onc recommended starting palliative XRT to cervical spine but to be delayed till labor day per Dr. Maryann Alar. Dr. Quinn Axe w/neuro also consulted for input on small parietal stroke seen on MRI brain and recommended avoiding hypotension as well as low dose ASA on hgb stabilizes. Palliative care has discussed [place] with wife who will discuss it with patient "when he is able to discuss it" and currently on full scope of treatment. He developed fever with somnolence, acute hypoxic respiratory failure and tachycardia on 08/19 and IV antibiotics added due to concerns of aspiration PNA.  AKI was treated with IVF and he was transfused with one unit PRBC due to drop in Hgb to 7.7.  Oxygen needs decreasing form to 2 L per [place] with improvement in secretions management. Lovenox added for DVT prophylaxis 08/17 and Unasyn transitioned to  augmetin today for 5 additional days of antibiotic coverage. Steroids have been weaned off.    ST evaluation without overt s/s of aspiration and general swallow precautions/implications of fatigue with recs to stop therapy 1 hour before meals to allow to rest for conservation energy for meals-->D3 w/chopped meats and rest breaks during meals and staff assist w/meals. 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 CIR recommended due to functional decline. Patient transferred to CIR on 10/17/2021 .   Patient currently requires total with mobility secondary to muscle weakness and muscle paralysis, decreased cardiorespiratoy endurance, impaired timing and sequencing and abnormal tone, and decreased sitting balance, decreased postural control, and decreased balance strategies.  Prior to hospitalization, patient was independent  with mobility and lived with Spouse in a House home.  Home access is 3Stairs to enter.  Patient will benefit from skilled PT intervention to maximize safe functional mobility, minimize fall risk, and decrease caregiver burden for planned discharge home with 24 hour assist.  Anticipate patient will benefit from follow up [facility] at discharge.  PT - End of Session Activity Tolerance: Tolerates 10 - 20 min activity with multiple rests Endurance Deficit: Yes Endurance Deficit Description: Pt fatigues quickly sitting upright PT Assessment Rehab Potential (ACUTE/IP ONLY): Good PT Barriers to Discharge: [place] home environment;Home environment access/layout;Neurogenic Bowel & Bladder;Wound Care PT Patient demonstrates impairments in the following area(s): Balance;Pain;Safety;Edema;Endurance;Sensory;Motor;Skin Integrity PT  Transfers Functional Problem(s): Bed Mobility;Bed to Chair;Car PT Locomotion Functional Problem(s): Wheelchair Mobility PT Plan PT Intensity: Minimum of 1-2 x/day  ,45 to 90 minutes PT Frequency: 5 out of 7 days PT Duration Estimated Length of Stay: ~4 weeks PT Treatment/Interventions: Discharge planning;DME/adaptive equipment instruction;Functional mobility training;Pain management;Psychosocial support;Therapeutic Activities;Splinting/orthotics;UE/LE Strength taining/ROM;Wheelchair propulsion/positioning;UE/LE Coordination activities;Therapeutic Exercise;Skin care/wound management;Patient/family education;Neuromuscular re-education;Functional electrical stimulation;Disease management/prevention;Community reintegration;Balance/vestibular training PT Transfers Anticipated Outcome(s): min A bed/chair transfers PT Locomotion Anticipated Outcome(s): supervision with LRAD PT Recommendation Recommendations for Other Services: Neuropsych consult;Therapeutic Recreation consult Therapeutic Recreation Interventions: Stress management;Outing/community reintergration Follow Up Recommendations: Home health PT Patient destination: Home Equipment Recommended: To be determined Equipment Details: likely custom w/c   PT Evaluation Precautions/Restrictions Precautions Precautions: Fall;Cervical Precaution Comments: C6 laminectomy, MAP >65 Restrictions Weight Bearing Restrictions: No General   Vital SignsTherapy Vitals Temp: 97.7 F (36.5 C) Temp Source: Oral Pulse Rate: 92 Resp: 18 BP: 135/71 Patient Position (if appropriate): Lying Oxygen Therapy SpO2: 92 % O2 Device: Nasal Cannula O2 Flow Rate (L/min): 2 L/min Pain Pain Assessment Pain Scale: 0-10 Pain Score: 0-No pain Pain Interference Pain Interference Pain Effect on Sleep: 0. Does not apply - I have not had any pain or hurting in the past 5 days Pain Interference with Therapy Activities: 0. Does not apply - I have not received rehabilitationtherapy in the past 5 days Pain Interference with Day-to-Day Activities: 1. 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 Vision/Perception  Perception Perception: Within [place]: Not tested  Cognition Overall Cognitive Status: Within Functional Limits for tasks assessed Arousal/Alertness: Awake/alert Orientation Level: Oriented X4 Year: 2023 Month: August Day of Week: Correct Attention: Sustained Sustained Attention: Appears intact Memory: Impaired Memory Impairment: Retrieval deficit Awareness: Appears intact Problem Solving: Appears intact Executive Function: Reasoning;Organizing;Decision Making;Self Monitoring Reasoning: Appears intact Organizing: Appears intact Decision Making: Appears intact Self Monitoring: Appears intact Safety/Judgment: Appears intact Sensation Sensation Light Touch: Impaired by gross assessment Hot/Cold: Not tested Proprioception: Impaired by gross assessment Stereognosis: Impaired by gross assessment Additional Comments: Pt with diminished sensation from lumbar dermatomes down, with patches of absent sensation Coordination Gross Motor Movements are Fluid and Coordinated: No Coordination and Movement Description: Pt limited by quadriplegia Motor  Motor Motor: Clonus;Tetraplegia;Abnormal postural alignment and control Motor - Skilled Clinical Observations: Pt limited by quadriplegia and near constant spasms   Trunk/Postural Assessment   Cervical Assessment Cervical Assessment: Exceptions to [facility] (recent laminectomy) Thoracic Assessment Thoracic Assessment: Exceptions to [facility] (kyphotic posture in sitting) Lumbar Assessment Lumbar Assessment: Exceptions to [facility] (sacral sit) Postural Control Postural Control: Deficits on evaluation (Pt with diminished truncal control requiring tot A to sit EOB)  Balance Balance Balance Assessed: Yes Static Sitting Balance Static Sitting - Balance Support: Feet supported Static Sitting - Level of Assistance: 1: +1 Total assist Extremity Assessment      RLE Assessment RLE Assessment: Exceptions to [facility] General Strength Comments: MMT limited by tone and spasms, no functional voluntary activation noted at this time LLE Assessment LLE Assessment: Exceptions to [facility]  General Strength Comments: MMT limited by tone and spasms, no functional voluntary activation noted at this time  Care Tool Care Tool Bed Mobility Roll left and right activity   Roll left and right assist level: 2 Helpers    Sit to lying activity   Sit to lying assist level: 2 Helpers    Lying to sitting on side of bed activity   Lying to sitting on side of bed assist level: the ability to move from lying on the back to sitting on the side of the bed with no back support.: 2 Helpers     Care Tool Transfers Sit to stand transfer Sit to stand activity did not occur: Safety/medical concerns      Chair/bed transfer Chair/bed transfer activity did not occur: Safety/medical concerns       Toilet transfer Toilet transfer activity did not occur: Safety/medical concerns      Scientist, product/process development transfer activity did not occur: Safety/medical concerns        Care Tool Locomotion Ambulation Ambulation activity did not occur: Safety/medical concerns        Walk 10 feet activity Walk 10 feet activity did not occur: Safety/medical concerns       Walk 50 feet with 2 turns activity Walk 50 feet with 2 turns activity did not  occur: Safety/medical concerns      Walk 150 feet activity Walk 150 feet activity did not occur: Safety/medical concerns      Walk 10 feet on uneven surfaces activity Walk 10 feet on uneven surfaces activity did not occur: Safety/medical concerns      Stairs Stair activity did not occur: Safety/medical concerns        Walk up/down 1 step activity Walk up/down 1 step or curb (drop down) activity did not occur: Safety/medical concerns      Walk up/down 4 steps activity Walk up/down 4 steps activity did not occur: Safety/medical concerns      Walk up/down 12 steps activity Walk up/down 12 steps activity did not occur: Safety/medical concerns      Pick up small objects from floor Pick up small object from the floor (from standing position) activity did not occur: Safety/medical concerns      Wheelchair Is the patient using a wheelchair?: No (Pt will use w/c but was not able to get up to chair today)   Wheelchair activity did not occur: Safety/medical concerns      Wheel 50 feet with 2 turns activity Wheelchair 50 feet with 2 turns activity did not occur: Safety/medical concerns    Wheel 150 feet activity Wheelchair 150 feet activity did not occur: Safety/medical concerns      Refer to Care Plan for Long Term Goals  SHORT TERM GOAL WEEK 1 PT Short Term Goal 1 (Week 1): pt will perform slideboard transfer with assist PT Short Term Goal 2 (Week 1): Pt with sit EOB with max A or better PT Short Term Goal 3 (Week 1): Pt will tolerate sitting up in w/c between therapy sessions.  Recommendations for other services: Neuropsych and Therapeutic Recreation  Stress management and Outing/community reintegration  Skilled Therapeutic Intervention Evaluation completed (see details above) with patient education regarding purpose of PT evaluation, PT POC and goals, therapy schedule, weekly team meetings, and other CIR information including safety plan and fall risk safety. Donned thigh high  teds for BP management. Pt performed the below functional mobility tasks with the specified levels of skilled cuing and assistance. Pt only able to  come to sit EOB d/t time constraints and needing to return to bed after session for ultrasound. Pt required tot A x 1 to sit EOB and tot A x 2 for supine<>sit. Noted bowel incontinence prior to sitting up, pt rolled with max A for dependent brief change. Pt with productive cough throughout session. Pt was able to sit EOB >5 min with no drop in BP. Pt returned to supine as described and was left with all needs in reach and alarm active.   Supine BP=137/71 Sitting BP=142/80    Mobility Bed Mobility Bed Mobility: Rolling Right;Rolling Left;Sit to Supine;Supine to Sit Rolling Right: Maximal Assistance - Patient 25-49% Rolling Left: Maximal Assistance - Patient 25-49% Supine to Sit: 2 Helpers Sit to Supine: 2 Helpers Locomotion  Gait Ambulation: No Gait Gait: No Stairs / Additional Locomotion Stairs: No Wheelchair Mobility Wheelchair Mobility: No   Discharge Criteria: Patient will be discharged from PT if patient refuses treatment 3 consecutive times without medical reason, if treatment goals not met, if there is a change in medical status, if patient makes no progress towards goals or if patient is discharged from hospital.  The above assessment, treatment plan, treatment alternatives and goals were discussed and mutually agreed upon: by patient  [name] 10/18/2021, 4:32 PM

## 2021-10-18 NOTE — Progress Notes (Signed)
PA aware of BLE Venous duplex results.  Yehuda Mao, LPN

## 2021-10-19 DIAGNOSIS — D649 Anemia, unspecified: Secondary | ICD-10-CM

## 2021-10-19 DIAGNOSIS — D72829 Elevated white blood cell count, unspecified: Secondary | ICD-10-CM | POA: Diagnosis not present

## 2021-10-19 DIAGNOSIS — G8250 Quadriplegia, unspecified: Secondary | ICD-10-CM

## 2021-10-19 DIAGNOSIS — C61 Malignant neoplasm of prostate: Secondary | ICD-10-CM | POA: Diagnosis not present

## 2021-10-19 DIAGNOSIS — I824Z2 Acute embolism and thrombosis of unspecified deep veins of left distal lower extremity: Secondary | ICD-10-CM | POA: Diagnosis not present

## 2021-10-19 LAB — CBC WITH DIFFERENTIAL/PLATELET
Abs Immature Granulocytes: 1.09 10*3/uL — ABNORMAL HIGH (ref 0.00–0.07)
Basophils Absolute: 0.1 10*3/uL (ref 0.0–0.1)
Basophils Relative: 0 %
Eosinophils Absolute: 0.3 10*3/uL (ref 0.0–0.5)
Eosinophils Relative: 2 %
HCT: 26.7 % — ABNORMAL LOW (ref 39.0–52.0)
Hemoglobin: 8.7 g/dL — ABNORMAL LOW (ref 13.0–17.0)
Immature Granulocytes: 7 %
Lymphocytes Relative: 14 %
Lymphs Abs: 2.2 10*3/uL (ref 0.7–4.0)
MCH: 31.3 pg (ref 26.0–34.0)
MCHC: 32.6 g/dL (ref 30.0–36.0)
MCV: 96 fL (ref 80.0–100.0)
Monocytes Absolute: 0.7 10*3/uL (ref 0.1–1.0)
Monocytes Relative: 4 %
Neutro Abs: 11.3 10*3/uL — ABNORMAL HIGH (ref 1.7–7.7)
Neutrophils Relative %: 73 %
Platelets: 336 10*3/uL (ref 150–400)
RBC: 2.78 MIL/uL — ABNORMAL LOW (ref 4.22–5.81)
RDW: 14.3 % (ref 11.5–15.5)
Smear Review: NORMAL
WBC: 15.6 10*3/uL — ABNORMAL HIGH (ref 4.0–10.5)
nRBC: 0.5 % — ABNORMAL HIGH (ref 0.0–0.2)

## 2021-10-19 LAB — GLUCOSE, CAPILLARY: Glucose-Capillary: 90 mg/dL (ref 70–99)

## 2021-10-19 NOTE — Progress Notes (Signed)
Occupational Therapy Session Note  Patient Details  Name: Jason [name] MRN: [id number] Date of Birth: 04-Jun-1955  Today's Date: 10/19/2021 OT Individual Time: 4665-9935 OT Individual Time Calculation (min): 58 min    Short Term Goals: Week 1:  OT Short Term Goal 1 (Week 1): Pt will perform simple self feeding with mod A with AE and compensatory set up OT Short Term Goal 2 (Week 1): Pt will complete UB bathing supported sitting at sink wiht mod A OT Short Term Goal 3 (Week 1): Pt will complete pull over shirt donning with adapted strategies with mod A OT Short Term Goal 4 (Week 1): Pt will perform sitting EOB with mod A for UE simple functional or reaching task  Skilled Therapeutic Interventions/Progress Updates:  Pt awake in bed with spouse present in room upon OT arrival to the room. Pt reports, "I just get so emotional about it." Pt became tearful 3-4x during session to which OT provided empathetic and therapeutic listening and conversation to assist with pt's comfort. Pt in agreement for OT session.  Therapy Documentation Precautions:  Precautions Precautions: Fall, Cervical Precaution Comments: C6 laminectomy, MAP >65 Restrictions Weight Bearing Restrictions: No Vital Signs (most recent vitals charted by nursing staff): Therapy Vitals Temp: 98.9 F (37.2 C) Pulse Rate: 92 Resp: 19 BP: 131/74 Patient Position (if appropriate): Lying Oxygen Therapy SpO2: 94 % O2 Device: Nasal Cannula O2 Flow Rate (L/min): 1 L/min Pain: Pain Assessment Pain Scale: 0-10 Pain Score: 0-No pain  ADL: Grooming: Minimal cueing, Moderate assistance (Pt able to perofrm oral care with moderate assistance for thoroughness due to RUE fatigue and minimal prompts for techniques to utilize tenodesis grasp to improve sustained grasp throughout task while supine in bed with HOB elevated.) ADL Comments: Pt and spouse report that pt was able to feed self by performing hand <> mouth movements with  spouse assist to load the feeding utensil, however, pt was able to perform this feeding task for all of breakfast and 75% of lunch (needed spouse assist d/t fatigue in RUE).  Basic SCI Education: Education provided to pt and spouse on basic SCI information. This includes potential for orthostatic hypotension with positional changes, importance of improving pt's tolerance with elevating HOB throughout the day, importance of positional changes, and compensatory techniques to perform functional movements. Education provided to pt and spouse on the role of OT throughout rehab admission. OT provided education and introduction to practicing pt directing care as a way to improve independence in functional tasks/mobility. Encouraged pt to practice this communication skill in order to improve comfort and independence with clearly directing care. Educated pt and spouse on how this skill will be needed throughout admission and after DC as pt is able to learn various techniques for ADL's/mobility and the potential for having staff that is not familiar with pt's specific techniques for tasks. Pt and spouse verbalize understanding. Pt practiced this skill by asking therapist to place pillow under arm. OT provided education on techniques to improve clarity with communication ("Can you place that pillow under my R elbow and forearm?") Educated pt that if specific body parts, positions, tools, etc can be used or described this can improve accuracy with what pt is directing. OT provided pt's spouse with basic SCI handout education/information to allow spouse and pt to review education and ask questions regarding specific SCI topics if needed. These handouts include: autonomic dysreflexia information, skin care & pressure relief after SCI, and sites for videos over (SCI intro, pressure injury &  prevention, lift transfers, bowel management, AD, and self advocacy).   Grasping Patterns/Exercises: Education and skilled demo provided  to pt and spouse on techniques to utilize tenodesis grasp with R hand to improve sustained and functional grasp with the R hand. Encouraged pt to practice performing tenodesis exercises by performing wrist extension with digital squeezes while in wrist extension. Pt able to perform ~5 reps of wrist extension with digital flexion/squeezes to demo understanding.   Pt remained in bed at end of session. Pt left resting comfortably in bed with personal belongings and call light within reach, bed alarm on and activated, bed in low position, 3 bed rails up, spouse present in room, and comfort needs attended to. OT provided assistance with re-positioning in bed as part of pressure relief.   Therapy/Group: Individual Therapy  Barbee Shropshire 10/19/2021, 4:32 PM

## 2021-10-19 NOTE — Progress Notes (Addendum)
PROGRESS NOTE   Subjective/Complaints:  WBC down to 15.6 today. Reports he feels well this AM.  He continues to have spasms in LLE but they are not uncomfortable or painful.  LBM 8/26   ROS:  Pt denies SOB, abd pain, CP, N/V/C/D, HA and vision changes  Objective:   VAS [place] LOWER EXTREMITY VENOUS (DVT)  Result Date: 10/18/2021  Lower Venous DVT Study Patient Name:  Jason [name]  Date of Exam:   10/18/2021 Medical Rec #: [id number]         Accession #:    [id number] Date of Birth: 21-Apr-1955         Patient Gender: M Patient Age:   66 years Exam Location:  [facility] Procedure:      VAS [place] LOWER EXTREMITY VENOUS (DVT) Referring Phys: PAMELA LOVE --------------------------------------------------------------------------------  Indications: Quadriplegia.  Comparison Study: 10/11/2021 - No evidence of deep venous thrombosis in either                   lower extremity. Performing Technologist: Oliver Hum RVT  Examination Guidelines: A complete evaluation includes B-mode imaging, spectral Doppler, color Doppler, and power Doppler as needed of all accessible portions of each vessel. Bilateral testing is considered an integral part of a complete examination. Limited examinations for reoccurring indications may be performed as noted. The reflux portion of the exam is performed with the patient in reverse Trendelenburg.  +---------+---------------+---------+-----------+----------+--------------+ RIGHT    CompressibilityPhasicitySpontaneityPropertiesThrombus Aging +---------+---------------+---------+-----------+----------+--------------+ CFV      Full           Yes      Yes                                 +---------+---------------+---------+-----------+----------+--------------+ SFJ      Full                                                         +---------+---------------+---------+-----------+----------+--------------+ FV Prox  Full                                                        +---------+---------------+---------+-----------+----------+--------------+ FV Mid   Full                                                        +---------+---------------+---------+-----------+----------+--------------+ FV DistalFull                                                        +---------+---------------+---------+-----------+----------+--------------+  PFV      Full                                                        +---------+---------------+---------+-----------+----------+--------------+ POP      Full           Yes      Yes                                 +---------+---------------+---------+-----------+----------+--------------+ PTV      Full                                                        +---------+---------------+---------+-----------+----------+--------------+ PERO     Full                                                        +---------+---------------+---------+-----------+----------+--------------+   +---------+---------------+---------+-----------+----------+--------------+ LEFT     CompressibilityPhasicitySpontaneityPropertiesThrombus Aging +---------+---------------+---------+-----------+----------+--------------+ CFV      Full           Yes      Yes                                 +---------+---------------+---------+-----------+----------+--------------+ SFJ      Full                                                        +---------+---------------+---------+-----------+----------+--------------+ FV Prox  Full                                                        +---------+---------------+---------+-----------+----------+--------------+ FV Mid   Full                                                         +---------+---------------+---------+-----------+----------+--------------+ FV DistalFull                                                        +---------+---------------+---------+-----------+----------+--------------+ PFV      Full                                                        +---------+---------------+---------+-----------+----------+--------------+  POP      Full           Yes      Yes                                 +---------+---------------+---------+-----------+----------+--------------+ PTV      Full                                                        +---------+---------------+---------+-----------+----------+--------------+ PERO     Full                                                        +---------+---------------+---------+-----------+----------+--------------+ Gastroc  None                                         Acute          +---------+---------------+---------+-----------+----------+--------------+     Summary: RIGHT: - There is no evidence of deep vein thrombosis in the lower extremity.  - No cystic structure found in the popliteal fossa.  LEFT: - Findings consistent with acute deep vein thrombosis involving the left gastrocnemius veins. - No cystic structure found in the popliteal fossa.  *See table(s) above for measurements and observations.    Preliminary    DG CHEST PORT 1 VIEW  Result Date: 10/17/2021 CLINICAL DATA:  Diarrhea, hypoxia. EXAM: PORTABLE CHEST 1 VIEW COMPARISON:  Chest radiograph dated 10/12/2021. FINDINGS: The heart size and mediastinal contours are within normal limits. The lung volumes are low. There is mild bibasilar atelectasis. A trace right pleural effusion may contribute. There is no left pleural effusion. There is no pneumothorax. Degenerative changes are seen in the spine. IMPRESSION: Mild bibasilar atelectasis. A trace right pleural effusion may contribute. Electronically Signed   By: Zerita Boers M.D.    On: 10/17/2021 20:08   DG Abd Portable 1V  Result Date: 10/17/2021 CLINICAL DATA:  Diarrhea, hypoxia. EXAM: PORTABLE ABDOMEN - 1 VIEW COMPARISON:  Abdominal radiograph dated 10/12/2021. FINDINGS: The bowel gas pattern is normal. Gas overlies the rectum. No radio-opaque calculi along the urinary tract. Degenerative changes are seen in the spine. Bilateral hip arthroplasties are noted. IMPRESSION: Nonobstructive bowel gas pattern. Electronically Signed   By: Zerita Boers M.D.   On: 10/17/2021 20:07   Recent Labs    10/18/21 0640 10/19/21 0639  WBC 17.1* 15.6*  HGB 8.4* 8.7*  HCT 25.3* 26.7*  PLT 319 336    Recent Labs    10/16/21 1517 10/18/21 0640  NA 136 138  K 3.7 3.7  CL 107 109  CO2 23 24  GLUCOSE 144* 97  BUN 16 15  CREATININE 0.52* 0.51*  CALCIUM 7.8* 8.2*     Intake/Output Summary (Last 24 hours) at 10/19/2021 1011 Last data filed at 10/19/2021 0555 Gross per 24 hour  Intake 360 ml  Output 1075 ml  Net -715 ml         Physical Exam: Vital Signs Blood pressure 127/77, pulse 74, temperature 98  F (36.7 C), resp. rate 15, height 5' 9" (1.753 m), weight 99.9 kg, SpO2 96 %.    General: awake, alert, appropriate, arms propped on pillows this AM; has soft call bell available; NAD HENT: conjugate gaze; oropharynx moist CV: regular rate; no JVD Pulmonary: decreased at bases, but good in upper lobes; no W/R/R 2L 02 [place] GI: soft, NT, ND, (+)BS Psychiatric: appropriate Neurological: Ox3 Genitourinary:    Comments: Medium amber urine in foley Musculoskeletal:     Cervical back: Neck supple. Tenderness present.     Comments: RUE- biceps 5/5; WE 4-/5; Triceps 2+/5; Grip 3-/5; FA 2-/5 LUE- biceps 5-/5; WE 4/5; Triceps 2+/5' Grip 3+/5; FA 2-/5 RLE- 0/5 in RLE LLE- HF 1/5; KE 2-/5; DF 1/5; PF 2-/5; EHL 1/5   Skin:    General: Skin is warm and dry.  Neurological:     Mental Status: He is alert.     Comments: Soft voice. Decreased postural control --listing to the  right and neck with decreased ROM. Able to passively get to midline without discomfort.    Decreased to light touch at L1 down to S1 B/L- unable to check S2-S5 today Almost constant spasms of RLE during exam; a few spasms seen on LLE Clonus sustained on RLE; and a few beats on LLE Hoffman's (+) B/L Ue's;  Ox3  Assessment/Plan: 1. Functional deficits which require 3+ hours per day of interdisciplinary therapy in a comprehensive inpatient rehab setting. Physiatrist is providing close team supervision and 24 hour management of active medical problems listed below. Physiatrist and rehab team continue to assess barriers to discharge/monitor patient progress toward functional and medical goals  Care Tool:  Bathing    Body parts bathed by patient: Face   Body parts bathed by helper: Right arm, Left arm, Chest, Abdomen, Front perineal area, Buttocks, Left lower leg, Right lower leg, Left upper leg, Right upper leg     Bathing assist Assist Level: Dependent - Patient 0%     Upper Body Dressing/Undressing Upper body dressing   What is the patient wearing?: Hospital gown only    Upper body assist Assist Level: Dependent - Patient 0%    Lower Body Dressing/Undressing Lower body dressing      What is the patient wearing?: Hospital gown only     Lower body assist Assist for lower body dressing: Dependent - Patient 0%     Toileting Toileting    Toileting assist Assist for toileting: Dependent - Patient 0%     Transfers Chair/bed transfer  Transfers assist  Chair/bed transfer activity did not occur: Safety/medical concerns        Locomotion Ambulation   Ambulation assist   Ambulation activity did not occur: Safety/medical concerns          Walk 10 feet activity   Assist  Walk 10 feet activity did not occur: Safety/medical concerns        Walk 50 feet activity   Assist Walk 50 feet with 2 turns activity did not occur: Safety/medical concerns          Walk 150 feet activity   Assist Walk 150 feet activity did not occur: Safety/medical concerns         Walk 10 feet on uneven surface  activity   Assist Walk 10 feet on uneven surfaces activity did not occur: Safety/medical concerns         Wheelchair     Assist Is the patient using a wheelchair?: No (Pt will use w/c  but was not able to get up to chair today)   Wheelchair activity did not occur: Safety/medical concerns         Wheelchair 50 feet with 2 turns activity    Assist    Wheelchair 50 feet with 2 turns activity did not occur: Safety/medical concerns       Wheelchair 150 feet activity     Assist  Wheelchair 150 feet activity did not occur: Safety/medical concerns       Blood pressure 127/77, pulse 74, temperature 98 F (36.7 C), resp. rate 15, height 5' 9" (1.753 m), weight 99.9 kg, SpO2 96 %.  Medical Problem List and Plan: 1. 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  Antithrombotics: -DVT/anticoagulation:  Pharmaceutical: Lovenox.               -antiplatelet therapy: ASA 3. Pain Management: Oxycodone prn.  4. Mood/Behavior/Sleep: LCSW to follow for evaluation and support.              -antipsychotic agents: N/A 5. Neuropsych/cognition: This patient is capable of making decisions on his own behalf. 6. Skin/Wound Care: Routine pressure relief measures              --monitor incision for healing.  7. Fluids/Electrolytes/Nutrition: Monitor I/O. Check CMET in am.  8. Prostate AdenoCA w/mets: Loading dose Firmagon 240 mg 10/06/21 per Dr. Tasia Catchings --Charlesetta Garibaldi changed to [facility] due to insurance coverage--next dose around 09/13. --Simulation for XRT 09/31 at 2 pm at [facility] 9. Dysphagia:  Continue D3 w/chopped meats and assist at meals. Aspiration precautions.  10. Neurogenic bladder: continue foley for now 11. Neurogenic bowel: Will get KUB to determine stool burden --Colace in am with suppository after supper.  8/25- had good results with bowel program 12. Sepsis/Likely aspiration PNA: Now on Augmentin--antibiotic D#5/10. BC pend-->neg so far.              --Follow up CXR in am.              --pulmonary hygiene for atelectasis/Asymmetric volume loss on right.  13. Abnormal LFTs: Improved overall except for elevation in Aphos--315. Will recheck in am  8/25- Alk phos up to 457; AST 62 and ALT 96- except for alk phos, they are heading downwards- con't to monitor weekly.  14. ABLA: Recheck in am  -stable 8.7 HGB 8/26, follow 15. Leucocytosis: Recheck in am. Monitor for fevers/other signs of infection.    8/25- WBC 17.1k- on Augmentin- will recheck labs in AM  -WBC improved to 15.6 10/19/21 16. Hypotension: Will continue midodrine for now.    8/25- BP better 120s/70s- con't regimen 17. DVT left gastrocnemius vein  -Lovenox was increased yesterday to 147m BID, Discussed with vascular-appreciate assistance, Distal DVT, will recheck in 1 -2 weeks, compression stockings     LOS: 2 days A FACE TO FACE EVALUATION WAS PERFORMED  YJennye Boroughs8/26/2023, 10:11 AM

## 2021-10-19 NOTE — IPOC Note (Signed)
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 [facility]) Active Problems:   Acute incomplete quadriplegia ([place])     Functional Problem List: Nursing Nutrition, Bladder, Bowel, Pain, Edema, Safety, Endurance, Sensory, Medication Management, Skin Integrity, Motor  PT Balance, Pain, Safety, Edema, Endurance, Sensory, Motor, Skin Integrity  OT Balance, Endurance, Motor, Sensory, Edema, Skin Integrity  [name] Nutrition  TR         Basic ADL's: OT Eating, Grooming, Bathing, Dressing, Toileting     Advanced  ADL's: OT Simple Meal Preparation, Light Housekeeping, Laundry     Transfers: PT Bed Mobility, Bed to Chair, Teacher, early years/pre, Metallurgist: PT Wheelchair Mobility     Additional Impairments: OT Fuctional Use of Upper Extremity  [name] Swallowing      TR      Anticipated Outcomes Item Anticipated Outcome  Self Feeding min A  Swallowing  Supervision A   Basic self-care  mod-max A  Toileting  max A with caregiver indep   Bathroom Transfers max A with caregiver indep  Bowel/Bladder  to remain continent of bowel and to be able to manage bladder  Transfers  min A bed/chair transfers  Locomotion  supervision with LRAD  Communication     Cognition     Pain  less than 3  Safety/Judgment  remain fall free while in rehab   Therapy Plan: PT Intensity: Minimum of 1-2 x/day ,45 to 90 minutes PT Frequency: 5 out of 7 days PT Duration Estimated Length of Stay: ~4 weeks OT Intensity: Minimum of 1-2 x/day, 45 to 90 minutes OT Frequency: 5 out of 7 days OT Duration/Estimated Length of Stay: 4 weeks [name] Intensity: Minumum of 1-2 x/day, 30 to 90 minutes [name] Frequency: 3 to 5 out of 7 days [name] Duration/Estimated Length of Stay: 4 weeks  (less for ST)   Team Interventions: Nursing Interventions Patient/Family Education, Disease Management/Prevention, Skin Care/Wound Management, Discharge Planning, Bladder Management, Pain Management, Psychosocial Support, Bowel Management, Medication Management, Dysphagia/Aspiration Precaution Training  PT interventions Discharge planning, DME/adaptive equipment instruction, Functional mobility training, Pain management, Psychosocial support, Therapeutic Activities, Splinting/orthotics, UE/LE Strength taining/ROM, Wheelchair propulsion/positioning, UE/LE Coordination activities, Therapeutic Exercise, Skin care/wound management, Patient/family education, Neuromuscular re-education, Functional electrical stimulation, Disease management/prevention, Academic librarian, Training and development officer  OT Interventions Training and development officer, Academic librarian, Discharge planning, Disease mangement/prevention, Engineer, drilling, Functional electrical stimulation, Functional mobility training, Neuromuscular re-education, Pain management, Patient/family education, Psychosocial support, Self Care/advanced ADL retraining, Skin care/wound managment, Splinting/orthotics, Therapeutic Activities, Therapeutic Exercise, UE/LE Strength taining/ROM, UE/LE Coordination activities, Wheelchair propulsion/positioning  [name] Interventions Dysphagia/aspiration precaution training, Patient/family education, Therapeutic Activities, Therapeutic Exercise  TR Interventions    SW/CM Interventions Discharge Planning, Psychosocial Support, Patient/Family Education   Barriers to Discharge MD  Medical stability, Home enviroment access/loayout, Incontinence, Neurogenic bowel and bladder, Wound care, Lack of/limited family support, Insurance for SNF coverage, Weight bearing restrictions, Pending surgery, and Pending chemo/radiation  Nursing Decreased caregiver support, Home environment access/layout,  Incontinence, Weight, Pending chemo/radiation    PT Inaccessible home environment, Home environment access/layout, Neurogenic Bowel & Bladder, Wound Care    OT Home environment access/layout, Neurogenic Bowel & Bladder, Incontinence, Pending chemo/radiation significant assist will be required to care for pt at home, 3 steps to enter, quadruplegia wiht bowel and bladder deficits  [name]  SW Decreased caregiver support, Incontinence, New oxygen, Insurance for SNF coverage, Pending chemo/radiation     Team Discharge Planning: Destination: PT-Home ,OT- Home , [name]-Home Projected Follow-up: PT-Home health PT, OT-  Home health OT, 24 hour supervision/assistance, [name]-None Projected Equipment Needs: PT-To be determined, OT- Sliding board, Tub/shower bench, Wheelchair (measurements), Wheelchair cushion (measurements), [name]-None recommended by [name] Equipment Details: PT-likely custom w/c, OT-lift, DABSC, hospital bed, custom seating and cushion, bathing DME Patient/family involved in discharge planning: PT- Family member/caregiver,  OT-Patient, Family member/caregiver, Passenger transport manager, Patient  MD ELOS: 4 weeks Medical Rehab Prognosis:  Good Assessment: The patient has been admitted for CIR therapies with the diagnosis of incomplete quadriplegia due to prostate mets. The team will be addressing functional mobility, strength, stamina, balance, safety, adaptive techniques and equipment, self-care, bowel and bladder mgt, patient and caregiver education, SCI education. Goals have been set at min-max A. Anticipated discharge destination is home after radiation, etc.        See Team Conference Notes for weekly updates to the plan of care

## 2021-10-19 NOTE — Progress Notes (Addendum)
Bowel program completed this shift per Md orders Patient had a medium bowel movement see flow sheet for details. Notified oncoming nurse    Yehuda Mao, LPN

## 2021-10-19 NOTE — Progress Notes (Signed)
Speech Language Pathology Daily Session Note  Patient Details  Name: Jason [name] MRN: [id number] Date of Birth: 05-14-1955  Today's Date: 10/19/2021 [name] Individual Time: 0950-1035 [name] Individual Time Calculation (min): 45 min  Short Term Goals: Week 1: [name] Short Term Goal 1 (Week 1): Pt will tolerate Dys 3/thin diet with no overt s/s aspiration or dysphagia given Supervision A verbal cues for safe swallow strategies [name] Short Term Goal 2 (Week 1): Pt will trial regular solids with no overt s/s aspiration or dysphagia given Supervision A verbal cues to determine potential for diet upgrade  Skilled Therapeutic Interventions:  Pt was seen for skilled ST targeting dysphagia goals.  [name] facilitated the session with a functional snack of dys 3 textures and thin liquids to monitor toleration of currently prescribed diet.  Pt was able to feed himself a Fig [place] but needed hand over hand assist to manage a styrofoam cup.  No overt s/s of aspiration were evident with solids or liquids; mod I use of swallowing precautions.  Pt's oral phase was timely and efficient for clearing residuals post swallow.  Pt reports that he would eventually like to resume a regular diet but that he is comfortable on his current diet for now.  [name] discussed basic swallowing precautions for decreasing aspiration risk and emphasized the importance of frequent oral care for minimizing bacterial load.  Wife and pt both verbalized understanding and all questions were answered to their satisfaction at this time.  Pt was left in bed with wife at bedside, bed alarm set, and call bell within reach.  Continue per current plan of care.    Pain Pain Assessment Pain Scale: 0-10 Pain Score: 0-No pain  Therapy/Group: Individual Therapy  Jahmez Bily, Selinda Orion 10/19/2021, 11:34 AM

## 2021-10-20 DIAGNOSIS — D72829 Elevated white blood cell count, unspecified: Secondary | ICD-10-CM | POA: Diagnosis not present

## 2021-10-20 DIAGNOSIS — G8250 Quadriplegia, unspecified: Secondary | ICD-10-CM | POA: Diagnosis not present

## 2021-10-20 DIAGNOSIS — N319 Neuromuscular dysfunction of bladder, unspecified: Secondary | ICD-10-CM

## 2021-10-20 DIAGNOSIS — I824Z2 Acute embolism and thrombosis of unspecified deep veins of left distal lower extremity: Secondary | ICD-10-CM | POA: Diagnosis not present

## 2021-10-20 DIAGNOSIS — C61 Malignant neoplasm of prostate: Secondary | ICD-10-CM | POA: Diagnosis not present

## 2021-10-20 MED ORDER — IPRATROPIUM-ALBUTEROL 0.5-2.5 (3) MG/3ML IN SOLN
3.0000 mL | Freq: Two times a day (BID) | RESPIRATORY_TRACT | Status: DC
Start: 2021-10-20 — End: 2021-10-25
  Administered 2021-10-20 – 2021-10-25 (×11): 3 mL via RESPIRATORY_TRACT
  Filled 2021-10-20 (×12): qty 3
  Filled 2021-10-20: qty 39

## 2021-10-20 NOTE — Progress Notes (Addendum)
Physical Therapy Session Note  Patient Details  Name: Jason [name] MRN: [id number] Date of Birth: 11-Apr-1955  Today's Date: 10/20/2021 PT Individual Time: 0800-0915, 1300-1408 PT Individual Time Calculation (min): 75 min, 68 min   Short Term Goals: Week 1:  PT Short Term Goal 1 (Week 1): pt will perform slideboard transfer with assist PT Short Term Goal 2 (Week 1): Pt with sit EOB with max A or better PT Short Term Goal 3 (Week 1): Pt will tolerate sitting up in w/c between therapy sessions.  Skilled Therapeutic Interventions/Progress Updates:    Session 1: pt received in bed and agreeable to therapy. No complaint of pain. Pt's wife, [name], Present throughout session and providing +2 assist with instruction and cueing. Donned ted hose tot A. Session focused on pt and family education while dressing and sitting up in chair. Donned shorts and shirt with tot A, pt able to roll with max A x 2 for dressing. Instruction and cues throughout. Pt then utilized maxi sky for transfer to TIS w/c. Education provided on pressure relief and instructed [name] on tilting chair for pressure relief and in case of orthostatic hypotension. Once pt was seated in chair, adjusted for appropriate fit. Pt remained sitting up in chair with his wife present and pillows under shoulders to protect subluxed shoulder and for comfort.   Session 2: pt received in bed and agreeable to therapy. No complaint of pain. Pt reports fatigue from sitting up this AM ~2 hours. Session focused on BLE passive ROM and active assist ROM and family training. Therapist provided hand outs on pressure injury prevention and PROM/stretching. Therapist provided pt with BLE stretching to hamstrings, hip rotators, plantar flexors. Pt then participated in AAROM heel slides and knee extension. Noted trace muscle activation on R and >2/5 activation with LLE. Pt was able to push into knee extension against light resistance with LLE. X 5 reps BIL. Pt  reports extreme fatigue after this activity. Therapist then demonstrated and instructed PROM exercises for pt's wife. [name] was able to perform each activity for BLE stretching. Provided instruction for frequency and repetition, both expressed understanding. Pt was left with all needs in reach and alarm active.   Therapy Documentation Precautions:  Precautions Precautions: Fall, Cervical Precaution Comments: C6 laminectomy, MAP >65 Restrictions Weight Bearing Restrictions: No General:      Therapy/Group: Individual Therapy  [name] 10/20/2021, 12:47 PM

## 2021-10-20 NOTE — Progress Notes (Addendum)
PROGRESS NOTE   Subjective/Complaints:  No new questions or concerns this AM. Reports some improvement if DF strength in LLE.   LBM 8/27   ROS:  Pt denies SOB, cough, abd pain, CP, N/V/C/D, HA and vision changes  Objective:   VAS [place] LOWER EXTREMITY VENOUS (DVT)  Result Date: 10/19/2021  Lower Venous DVT Study Patient Name:  Jason REIERSON  Date of Exam:   10/18/2021 Medical Rec #: [id number]         Accession #:    [id number] Date of Birth: May 24, 1955         Patient Gender: M Patient Age:   66 years Exam Location:  [facility] Procedure:      VAS [place] LOWER EXTREMITY VENOUS (DVT) Referring Phys: PAMELA LOVE --------------------------------------------------------------------------------  Indications: Quadriplegia.  Comparison Study: 10/11/2021 - No evidence of deep venous thrombosis in either                   lower extremity. Performing Technologist: Oliver Hum RVT  Examination Guidelines: A complete evaluation includes B-mode imaging, spectral Doppler, color Doppler, and power Doppler as needed of all accessible portions of each vessel. Bilateral testing is considered an integral part of a complete examination. Limited examinations for reoccurring indications may be performed as noted. The reflux portion of the exam is performed with the patient in reverse Trendelenburg.  +---------+---------------+---------+-----------+----------+--------------+ RIGHT    CompressibilityPhasicitySpontaneityPropertiesThrombus Aging +---------+---------------+---------+-----------+----------+--------------+ CFV      Full           Yes      Yes                                 +---------+---------------+---------+-----------+----------+--------------+ SFJ      Full                                                        +---------+---------------+---------+-----------+----------+--------------+ FV Prox  Full                                                         +---------+---------------+---------+-----------+----------+--------------+ FV Mid   Full                                                        +---------+---------------+---------+-----------+----------+--------------+ FV DistalFull                                                        +---------+---------------+---------+-----------+----------+--------------+  PFV      Full                                                        +---------+---------------+---------+-----------+----------+--------------+ POP      Full           Yes      Yes                                 +---------+---------------+---------+-----------+----------+--------------+ PTV      Full                                                        +---------+---------------+---------+-----------+----------+--------------+ PERO     Full                                                        +---------+---------------+---------+-----------+----------+--------------+   +---------+---------------+---------+-----------+----------+--------------+ LEFT     CompressibilityPhasicitySpontaneityPropertiesThrombus Aging +---------+---------------+---------+-----------+----------+--------------+ CFV      Full           Yes      Yes                                 +---------+---------------+---------+-----------+----------+--------------+ SFJ      Full                                                        +---------+---------------+---------+-----------+----------+--------------+ FV Prox  Full                                                        +---------+---------------+---------+-----------+----------+--------------+ FV Mid   Full                                                        +---------+---------------+---------+-----------+----------+--------------+ FV DistalFull                                                         +---------+---------------+---------+-----------+----------+--------------+ PFV      Full                                                        +---------+---------------+---------+-----------+----------+--------------+   POP      Full           Yes      Yes                                 +---------+---------------+---------+-----------+----------+--------------+ PTV      Full                                                        +---------+---------------+---------+-----------+----------+--------------+ PERO     Full                                                        +---------+---------------+---------+-----------+----------+--------------+ Gastroc  None                                         Acute          +---------+---------------+---------+-----------+----------+--------------+     Summary: RIGHT: - There is no evidence of deep vein thrombosis in the lower extremity.  - No cystic structure found in the popliteal fossa.  LEFT: - Findings consistent with acute deep vein thrombosis involving the left gastrocnemius veins. - No cystic structure found in the popliteal fossa.  *See table(s) above for measurements and observations. Electronically signed by Harold Barban MD on 10/19/2021 at 4:13:36 PM.    Final    Recent Labs    10/18/21 0640 10/19/21 0639  WBC 17.1* 15.6*  HGB 8.4* 8.7*  HCT 25.3* 26.7*  PLT 319 336    Recent Labs    10/18/21 0640  NA 138  K 3.7  CL 109  CO2 24  GLUCOSE 97  BUN 15  CREATININE 0.51*  CALCIUM 8.2*     Intake/Output Summary (Last 24 hours) at 10/20/2021 0847 Last data filed at 10/20/2021 4403 Gross per 24 hour  Intake 598 ml  Output 1151 ml  Net -553 ml         Physical Exam: Vital Signs Blood pressure 122/73, pulse 90, temperature 98.2 F (36.8 C), temperature source Oral, resp. rate 18, height _0  (1.753 m), weight 99.9 kg, SpO2 93 %.    General: awake, alert, appropriate, sitting in WC this AM; has soft  call bell available; NAD HENT: conjugate gaze; oropharynx moist CV: regular rate; no JVD Pulmonary: decreased at bases, but good in upper lobes; no W/R/R 1L 02 Winder GI: soft, NT, ND, (+)BS Psychiatric: appropriate Neurological: Ox3 Genitourinary:    Comments: Medium amber urine in foley Musculoskeletal:     Cervical back: Neck supple. Tenderness present.     Comments: RUE- biceps 5/5; WE 4-/5; Triceps 2+/5; Grip 3-/5; FA 2-/5 LUE- biceps 5-/5; WE 4/5; Triceps 2+/5' Grip 3+/5; FA 2-/5 RLE- 0/5 in RLE LLE- HF 1/5; KE 2-/5; DF 1/5; PF 2/5; EHL 1/5   Skin:    General: Skin is warm and dry.  Neurological:     Mental Status: He is alert.     Comments: Soft voice. Decreased postural control --listing to the right and  neck with decreased ROM. Able to passively get to midline without discomfort.    Decreased to light touch at L1 down to S1 B/L- unable to check S2-S5 today Almost constant spasms of RLE during exam; a few spasms seen on LLE Clonus sustained on RLE; and a few beats on LLE Hoffman's (+) B/L Ue's;  Ox3  Assessment/Plan: 1. Functional deficits which require 3+ hours per day of interdisciplinary therapy in a comprehensive inpatient rehab setting. Physiatrist is providing close team supervision and 24 hour management of active medical problems listed below. Physiatrist and rehab team continue to assess barriers to discharge/monitor patient progress toward functional and medical goals  Care Tool:  Bathing    Body parts bathed by patient: Face   Body parts bathed by helper: Right arm, Left arm, Chest, Abdomen, Front perineal area, Buttocks, Left lower leg, Right lower leg, Left upper leg, Right upper leg     Bathing assist Assist Level: Dependent - Patient 0%     Upper Body Dressing/Undressing Upper body dressing   What is the patient wearing?: Hospital gown only    Upper body assist Assist Level: Dependent - Patient 0%    Lower Body Dressing/Undressing Lower body  dressing      What is the patient wearing?: Hospital gown only     Lower body assist Assist for lower body dressing: Dependent - Patient 0%     Toileting Toileting    Toileting assist Assist for toileting: Dependent - Patient 0%     Transfers Chair/bed transfer  Transfers assist  Chair/bed transfer activity did not occur: Safety/medical concerns        Locomotion Ambulation   Ambulation assist   Ambulation activity did not occur: Safety/medical concerns          Walk 10 feet activity   Assist  Walk 10 feet activity did not occur: Safety/medical concerns        Walk 50 feet activity   Assist Walk 50 feet with 2 turns activity did not occur: Safety/medical concerns         Walk 150 feet activity   Assist Walk 150 feet activity did not occur: Safety/medical concerns         Walk 10 feet on uneven surface  activity   Assist Walk 10 feet on uneven surfaces activity did not occur: Safety/medical concerns         Wheelchair     Assist Is the patient using a wheelchair?: No (Pt will use w/c but was not able to get up to chair today)   Wheelchair activity did not occur: Safety/medical concerns         Wheelchair 50 feet with 2 turns activity    Assist    Wheelchair 50 feet with 2 turns activity did not occur: Safety/medical concerns       Wheelchair 150 feet activity     Assist  Wheelchair 150 feet activity did not occur: Safety/medical concerns       Blood pressure 122/73, pulse 90, temperature 98.2 F (36.8 C), temperature source Oral, resp. rate 18, height $RemoveBe'5\' 9"'ozIZfHZVi$  (1.753 m), weight 99.9 kg, SpO2 93 %.  Medical Problem List and Plan: 1. 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  Antithrombotics: -DVT/anticoagulation:  Pharmaceutical: Lovenox.               -antiplatelet therapy: ASA 3. Pain Management: Oxycodone prn.  4. Mood/Behavior/Sleep: LCSW to follow for evaluation and support.              -antipsychotic agents: N/A 5. Neuropsych/cognition: This patient is capable of making decisions on his own behalf. 6. Skin/Wound Care: Routine pressure relief measures              --monitor incision for healing.  7. Fluids/Electrolytes/Nutrition: Monitor I/O. Check CMET in am.  8. Prostate AdenoCA w/mets: Loading dose Firmagon 240 mg 10/06/21 per Dr. Tasia Catchings --Charlesetta Garibaldi changed to [facility] due to insurance coverage--next dose around 09/13. --Simulation for XRT 09/31 at 2 pm at [facility] 9. Dysphagia: Continue D3 w/chopped meats and assist at meals. Aspiration precautions.  10. Neurogenic bladder: continue foley for now 11. Neurogenic bowel: Will get KUB to determine stool burden --Colace in am with suppository after supper.  8/25- had good results with bowel program 8/27- good results with bowel program yesterday 12. Sepsis/Likely aspiration PNA: Now on Augmentin--antibiotic D#5/10. BC pend-->neg so far.              --Follow up CXR in am.              --pulmonary hygiene for atelectasis/Asymmetric volume loss on right.  13. Abnormal LFTs: Improved overall except for elevation in Aphos--315. Will recheck in am  8/25- Alk phos up to 457; AST 62 and ALT 96- except for alk phos, they are heading downwards- con't to monitor weekly.  14. ABLA: Recheck in am  -stable 8.7 HGB 8/26,  Recheck tomorrow 15. Leucocytosis: Recheck in am. Monitor for fevers/other signs of infection.    8/25- WBC 17.1k- on Augmentin- will recheck labs in AM  -WBC improved to 15.6 10/19/21  -Recheck CBC tomorrow 16. Hypotension: Will continue midodrine for now.    8/25- BP better 120s/70s- con't regimen 17. DVT left gastrocnemius vein  -8/26 Noted Lovenox was increased yesterday 8/25 to 147m  BID, Distal DVT, will recheck in 1 -2 weeks, compression stockings,      LOS: 3 days A FACE TO FACE EVALUATION WAS PERFORMED  YJennye Boroughs8/27/2023, 8:47 AM

## 2021-10-20 NOTE — Progress Notes (Signed)
Bowel program completed, dig stim performed, small stool returned. No complications noted. Sheela Stack, LPN

## 2021-10-20 NOTE — Progress Notes (Signed)
Occupational Therapy Session Note  Patient Details  Name: HALL BIRCHARD MRN: [id number] Date of Birth: [birth year]  Today's Date: 10/20/2021 OT Individual Time: 1020-1130 OT Individual Time Calculation (min): 70 min    Short Term Goals: Week 1:  OT Short Term Goal 1 (Week 1): Pt will perform simple self feeding with mod A with AE and compensatory set up OT Short Term Goal 2 (Week 1): Pt will complete UB bathing supported sitting at sink wiht mod A OT Short Term Goal 3 (Week 1): Pt will complete pull over shirt donning with adapted strategies with mod A OT Short Term Goal 4 (Week 1): Pt will perform sitting EOB with mod A for UE simple functional or reaching task  Skilled Therapeutic Interventions/Progress Updates:      Therapy Documentation Precautions:  Precautions Precautions: Fall, Cervical Precaution Comments: C6 laminectomy, MAP >65 Restrictions Weight Bearing Restrictions: No General:Patient received sitting up in w/c with wife present. Very motivated to participate with therapy.     Pain:No c/o pain with activity.   ADL: ADL Grooming: Max assist with oral care using built up handle on toothbrush. Patient able to bring toothbrush to mouth without assist. Able to brush teeth with only minor assist for thoroughness while seated in w/c with BUE's supported on pillows on his w/c arm rests. Where Assessed-Grooming: W/C level Upper Body Bathing: Dependent     Other Treatments:  Worked on base mobility skills following grooming tasks. Worked on scapular adduction to shift away from the back of the tilt in space, with cues to activate abdominals. Continued with use of RUE to pull self forward for pressure relief/ basic transfer skills. Patient able to tolerate holding self with moderate assist to initiate shift from the back of the w/c. Good hold for 5 seconds with each forward shift; set of 5. Educated wife on assisted UE movements working on proximal strength and wt bearing for  progression to supported seated activities. Patient assisted back to bed utilizing hoyer, assist of two. Total assist for bed positioning. Patient had tolerated 2 hours up in tilt in space at the time he was returned to bed.   Therapy/Group: Individual Therapy  Hermina Barters 10/20/2021, 3:23 PM

## 2021-10-20 NOTE — Progress Notes (Signed)
Explained and demonstrated "quad cough" technique. Assisted pt with coughing and expectorated a moderate amount of thick tan sputum. Pt verbalizes understanding of quad cough Sheela Stack, LPN

## 2021-10-20 NOTE — Progress Notes (Signed)
Bowel program completed this shift per md orders. Patient had medium size bowel movement.

## 2021-10-21 ENCOUNTER — Telehealth: Payer: Self-pay | Admitting: *Deleted

## 2021-10-21 DIAGNOSIS — C61 Malignant neoplasm of prostate: Secondary | ICD-10-CM | POA: Diagnosis not present

## 2021-10-21 DIAGNOSIS — C7940 Secondary malignant neoplasm of unspecified part of nervous system: Secondary | ICD-10-CM | POA: Diagnosis not present

## 2021-10-21 LAB — BASIC METABOLIC PANEL
Anion gap: 8 (ref 5–15)
BUN: 17 mg/dL (ref 8–23)
CO2: 24 mmol/L (ref 22–32)
Calcium: 8.2 mg/dL — ABNORMAL LOW (ref 8.9–10.3)
Chloride: 107 mmol/L (ref 98–111)
Creatinine, Ser: 0.51 mg/dL — ABNORMAL LOW (ref 0.61–1.24)
GFR, Estimated: >60 mL/min (ref >60)
Glucose, Bld: 95 mg/dL (ref 70–99)
Potassium: 3.8 mmol/L (ref 3.5–5.1)
Sodium: 139 mmol/L (ref 135–145)

## 2021-10-21 LAB — CBC
HCT: 27.1 % — ABNORMAL LOW (ref 39.0–52.0)
Hemoglobin: 8.9 g/dL — ABNORMAL LOW (ref 13.0–17.0)
MCH: 31.6 pg (ref 26.0–34.0)
MCHC: 32.8 g/dL (ref 30.0–36.0)
MCV: 96.1 fL (ref 80.0–100.0)
Platelets: 347 10*3/uL (ref 150–400)
RBC: 2.82 MIL/uL — ABNORMAL LOW (ref 4.22–5.81)
RDW: 15.7 % — ABNORMAL HIGH (ref 11.5–15.5)
WBC: 10.8 10*3/uL — ABNORMAL HIGH (ref 4.0–10.5)
nRBC: 0.3 % — ABNORMAL HIGH (ref 0.0–0.2)

## 2021-10-21 NOTE — Progress Notes (Signed)
Physical Therapy Session Note  Patient Details  Name: Jason [name] MRN: [id number] Date of Birth: 1955/06/15  Today's Date: 10/21/2021 PT Individual Time: 0800-0900 PT Individual Time Calculation (min): 60 min   Short Term Goals: Week 1:  PT Short Term Goal 1 (Week 1): pt will perform slideboard transfer with assist PT Short Term Goal 2 (Week 1): Pt with sit EOB with max A or better PT Short Term Goal 3 (Week 1): Pt will tolerate sitting up in w/c between therapy sessions.  Skilled Therapeutic Interventions/Progress Updates:    pt received in bed and agreeable to therapy. No complaint of pain. Doffed PRAFOs and donned teds dependently. Pt agreeable to sitting EOB this session and delaying dressing until OT session. Supine>sit with tot A +2. Pt required mod-max for sitting balance, with tendency for posterior lean and kyphotic sitting posture. Pt able to push/pull with therapist's hand with RUE to adjust trunk position. Pt then performed x 3 bouts of lateral leans onto R elbow with focus on bringing weight forward. Pt was able to maintain with as little as min-mod A for short bouts. Pt was not able to coordinate elbow extension to push up from lateral position and required tot A. Pt returned to bed with tot +2. Pt then noted to have soiled brief. Pt able to roll with mod-max + 2 with assist from pt's wife to maintain position during dependent hygiene. Pt remained in bed after session and was left with all needs in reach and alarm active.   Therapy Documentation Precautions:  Precautions Precautions: Fall, Cervical Precaution Comments: C6 laminectomy, MAP >65 Restrictions Weight Bearing Restrictions: No General:       Therapy/Group: Individual Therapy  [name] 10/21/2021, 12:13 PM

## 2021-10-21 NOTE — Progress Notes (Signed)
Bowel program completed. Dig stim performed, small stool returned. No complications noted.  Performed quad cough and reinforced education. Pt expectorated moderate thick sputum, suctioned using oral suction. PRAFO boots and hand spints applied. Pt in aligned position.     10/21/21 2202  Unmeasured Output  Stool Occurrence 1  Stool Characteristics  Bowel Incontinence Yes  Stool Type Type 6 (Mushy consistency with ragged edges)  Stool Descriptors Brown  Stool Amount Small  Stool Source Rectum

## 2021-10-21 NOTE — Telephone Encounter (Signed)
Received a call from Algis Liming, PA with [facility] stating that patient is in Rehab for the next 4 weeks and is getting radiation therapy at [facility]. She states that she sees patient has appts with [place] and he will not be able to come to She is asking if [name] wants to consider transferring patient oncology care to a [facility] doctor. Please advise

## 2021-10-21 NOTE — Care Management (Signed)
[place] Individual Statement of Services  Patient Name:  Jason [name]  Date:  10/21/2021  Welcome to the [facility].  Our goal is to provide you with an individualized program based on your diagnosis and situation, designed to meet your specific needs.  With this comprehensive rehabilitation program, you will be expected to participate in at least 3 hours of rehabilitation therapies Monday-Friday, with modified therapy programming on the weekends.  Your rehabilitation program will include the following services:  Physical Therapy (PT), Occupational Therapy (OT), Speech Therapy (ST), 24 hour per day rehabilitation nursing, Therapeutic Recreaction (TR), Psychology, Neuropsychology, Care Coordinator, Rehabilitation Medicine, [place], and Other  Weekly team conferences will be held on Tuesdays to discuss your progress.  Your Inpatient Rehabilitation Care Coordinator will talk with you frequently to get your input and to update you on team discussions.  Team conferences with you and your family in attendance may also be held.  Expected length of stay: 4 weeks    Overall anticipated outcome: Moderate Assistance  Depending on your progress and recovery, your program may change. Your Inpatient Rehabilitation Care Coordinator will coordinate services and will keep you informed of any changes. Your Inpatient Rehabilitation Care Coordinator's name and contact numbers are listed  below.  The following services may also be recommended but are not provided by the [facility] will be made to provide these services after discharge if needed.  Arrangements include referral to agencies that provide these services.  Your insurance has been verified to be:  [employer]  Your primary  doctor is:  Viviana Simpler  Pertinent information will be shared with your doctor and your insurance company.  Inpatient Rehabilitation Care Coordinator:  Cathleen Corti [phone] or ([phone]  Information discussed with and copy given to patient by: Rana Snare, 10/21/2021, 8:57 AM

## 2021-10-21 NOTE — Progress Notes (Signed)
Staples removed, incision cleaned and steri strips applied. Incision approximated and closed.

## 2021-10-21 NOTE — Progress Notes (Signed)
  Inpatient Rehabilitation Admissions Coordinator   I have arranged [facility] transport for 8/31 radiation oncology consultation at [facility] at 2 pm and then simulation at 3 pm. [facility] will pickup at 1330.  SW and therapy scheduling, Suezanne Jacquet, made aware. Radiation treatments to begin next week. Once dates and times made available on 9/1, I will arrange [facility] transport. Patient and wife also made aware of plans.  Danne Baxter, RN, MSN Rehab Admissions Coordinator [phone] 10/21/2021 4:04 PM

## 2021-10-21 NOTE — Progress Notes (Signed)
PROGRESS NOTE   Subjective/Complaints:  Down to 1L O2-  Bowel program working well- some accidents. Pain is OK- no pain from spasms/spasticity- doesn't want more meds for this.   Wife worried about appointments being scheduled at [facility], per pt.   Asking when gets staples out- surgery was 8/13- so has been 14 days-    ROS:  Pt denies SOB, abd pain, CP, N/V/C/D, and vision changes  Objective:   No results found.  Recent Labs    10/19/21 0639 10/21/21 0557  WBC 15.6* 10.8*  HGB 8.7* 8.9*  HCT 26.7* 27.1*  PLT 336 347   Recent Labs    10/21/21 0557  NA 139  K 3.8  CL 107  CO2 24  GLUCOSE 95  BUN 17  CREATININE 0.51*  CALCIUM 8.2*    Intake/Output Summary (Last 24 hours) at 10/21/2021 0844 Last data filed at 10/21/2021 0141 Gross per 24 hour  Intake 712 ml  Output 2100 ml  Net -1388 ml        Physical Exam: Vital Signs Blood pressure 130/76, pulse 82, temperature 98 F (36.7 C), resp. rate 18, height '5\' 9"'  (1.753 m), weight 99.9 kg, SpO2 98 %.    General: awake, alert, appropriate, supine in bed; NT feeding pt; NAD HENT: conjugate gaze; oropharynx moist CV: regular rate; no JVD Pulmonary: CTA B/L; no W/R/R- good air movement- decreased at bases; 1L O2 by [place] GI: soft, NT, ND, (+)BS- normoactive Psychiatric: appropriate- slightly flat affect Neurological: Ox3 Genitourinary:    Comments: Medium amber urine in foley Musculoskeletal:     Cervical back: Neck supple. Tenderness present.     Comments: RUE- biceps 5/5; WE 4-/5; Triceps 2+/5; Grip 3-/5; FA 2-/5 LUE- biceps 5-/5; WE 4/5; Triceps 2+/5' Grip 3+/5; FA 2-/5 RLE- 0/5 in RLE LLE- HF 1/5; KE 2-/5; DF 1/5; PF 2/5; EHL 1/5   Skin:    General: Skin is warm and dry.  Neurological:     Mental Status: He is alert.     Comments: Soft voice. Decreased postural control --listing to the right and neck with decreased ROM. Able to passively get to  midline without discomfort.    Decreased to light touch at L1 down to S1 B/L- unable to check S2-S5 today Almost constant spasms of RLE during exam; a few spasms seen on LLE Clonus sustained on RLE; and a few beats on LLE Hoffman's (+) B/L Ue's;  Ox3  Assessment/Plan: 1. Functional deficits which require 3+ hours per day of interdisciplinary therapy in a comprehensive inpatient rehab setting. Physiatrist is providing close team supervision and 24 hour management of active medical problems listed below. Physiatrist and rehab team continue to assess barriers to discharge/monitor patient progress toward functional and medical goals  Care Tool:  Bathing    Body parts bathed by patient: Face   Body parts bathed by helper: Right arm, Left arm, Chest, Abdomen, Front perineal area, Buttocks, Left lower leg, Right lower leg, Left upper leg, Right upper leg     Bathing assist Assist Level: Dependent - Patient 0%     Upper Body Dressing/Undressing Upper body dressing   What is the patient wearing?: Hospital  gown only    Upper body assist Assist Level: Dependent - Patient 0%    Lower Body Dressing/Undressing Lower body dressing      What is the patient wearing?: Hospital gown only     Lower body assist Assist for lower body dressing: Dependent - Patient 0%     Toileting Toileting    Toileting assist Assist for toileting: Dependent - Patient 0%     Transfers Chair/bed transfer  Transfers assist  Chair/bed transfer activity did not occur: Safety/medical concerns        Locomotion Ambulation   Ambulation assist   Ambulation activity did not occur: Safety/medical concerns          Walk 10 feet activity   Assist  Walk 10 feet activity did not occur: Safety/medical concerns        Walk 50 feet activity   Assist Walk 50 feet with 2 turns activity did not occur: Safety/medical concerns         Walk 150 feet activity   Assist Walk 150 feet activity  did not occur: Safety/medical concerns         Walk 10 feet on uneven surface  activity   Assist Walk 10 feet on uneven surfaces activity did not occur: Safety/medical concerns         Wheelchair     Assist Is the patient using a wheelchair?: No (Pt will use w/c but was not able to get up to chair today)   Wheelchair activity did not occur: Safety/medical concerns         Wheelchair 50 feet with 2 turns activity    Assist    Wheelchair 50 feet with 2 turns activity did not occur: Safety/medical concerns       Wheelchair 150 feet activity     Assist  Wheelchair 150 feet activity did not occur: Safety/medical concerns       Blood pressure 130/76, pulse 82, temperature 98 F (36.7 C), resp. rate 18, height '5\' 9"'  (1.753 m), weight 99.9 kg, SpO2 98 %.  Medical Problem List and Plan: 1. 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  Antithrombotics: -DVT/anticoagulation:  Pharmaceutical: Lovenox.               -antiplatelet therapy: ASA 3. Pain Management: Oxycodone prn. 8/28- pt reports no significant pain- and doesn't want spasticity meds at this time.   4. Mood/Behavior/Sleep: LCSW to follow for evaluation and support.              -antipsychotic agents: N/A 5. Neuropsych/cognition: This patient is capable of making decisions on his own behalf. 6. Skin/Wound Care: Routine pressure relief measures              --monitor incision for healing.  7. Fluids/Electrolytes/Nutrition: Monitor I/O. Check CMET in am.  8. Prostate AdenoCA w/mets: Loading dose Firmagon 240 mg 10/06/21 per Dr. Tasia Catchings --Charlesetta Garibaldi changed to [facility] due to insurance coverage--next dose around 09/13. --Simulation for XRT 09/31 at 2 pm at [facility] 9.  Dysphagia: Continue D3 w/chopped meats and assist at meals. Aspiration precautions.  10. Neurogenic bladder: continue foley for now 11. Neurogenic bowel: Will  get KUB to determine stool burden --Colace in am with suppository after supper.  8/25- had good results with bowel program 8/27- good results with bowel program yesterday 8/28- having bowel accidents- and BM with bowel program last 3 nights-  12. Sepsis/Likely aspiration PNA: Now on Augmentin--antibiotic D#5/10. BC pend-->neg so far.              --Follow up CXR in am.              --pulmonary hygiene for atelectasis/Asymmetric volume loss on right.  13. Abnormal LFTs: Improved overall except for elevation in Aphos--315. Will recheck in am  8/25- Alk phos up to 457; AST 62 and ALT 96- except for alk phos, they are heading downwards- con't to monitor weekly.  14. ABLA: Recheck in am  -stable 8.7 HGB 8/26,  Recheck tomorrow 15. Leucocytosis: Recheck in am. Monitor for fevers/other signs of infection.    8/25- WBC 17.1k- on Augmentin- will recheck labs in AM  -WBC improved to 15.6 10/19/21  -Recheck CBC tomorrow  8/28- Down to 10.8k- con't to monitor 16. Hypotension: Will continue midodrine for now.    8/25- BP better 120s/70s- con't regimen 17. DVT left gastrocnemius vein  -8/26 Noted Lovenox was increased yesterday 8/25 to 156m BID, Distal DVT, will recheck in 1 -2 weeks, compression stockings,   18. Spasticity  8/28- pt doesn't want spasticity meds- isn't painful or annoying.  [place] 8/28- will d/c staples and [facility]    I spent a total of  37  minutes on total care today- >50% coordination of care- due to d/w SW and PA about appointments and nursing about staples.     LOS: 4 days A FACE TO FACE EVALUATION WAS PERFORMED  Jason [name] 10/21/2021, 8:44 AM

## 2021-10-21 NOTE — Progress Notes (Signed)
Speech Language Pathology Daily Session Note  Patient Details  Name: Jason [name] MRN: [id number] Date of Birth: 02/26/1955  Today's Date: 10/21/2021 [name] Individual Time: 4656-8127 [name] Individual Time Calculation (min): 59 min  Short Term Goals: Week 1: [name] Short Term Goal 1 (Week 1): Pt will tolerate Dys 3/thin diet with no overt s/s aspiration or dysphagia given Supervision A verbal cues for safe swallow strategies [name] Short Term Goal 2 (Week 1): Pt will trial regular solids with no overt s/s aspiration or dysphagia given Supervision A verbal cues to determine potential for diet upgrade  Skilled Therapeutic Interventions:Skilled ST services focused on education and swallow skills. Pt demonstrated recall of swallow strategies mod I. [name] provided education and handout pertaining to dys 3 textures and noted differences from regular textures. [name] facilitated PO intake of dys 3 and regular texture trial snacks. Pt demonstrated mod I use of swallow strategies (cease verbalization during mastication, upright to 90 degrees, small bites/sips, use of liquid wash intermittently), swallow function appeared WFL on both textures (appropriate mastication, oral clearance, timely swallow and no overt s/s aspiration.) Pt supports h/o of pneumonia (in and out of the hospital) due to phlegm verse aspiration. [name] provided education pertaining for increased risk of pneumonia due to limited mobility as well. All questions answered to satisfaction from pt and pt's wife. [name] will plan a regular textured lunch tray in upcoming session and reduced frequency to x1-3 a week, pt/family agreed. Pt was left in room with family, call bell within reach and bed alarm set. [name] recommends to continue skilled services.     Pain Pain Assessment Pain Scale: 0-10 Pain Score: 0-No pain  Therapy/Group: Individual Therapy  Kimya Mccahill  [facility] 10/21/2021, 1:59 PM

## 2021-10-21 NOTE — Progress Notes (Signed)
Patient ID: Jason [name], male   DOB: 1955-11-11, 66 y.o.   MRN: [id number]  10/19/2021- [facility] accepted HHPT/OT/[name]/SN.   Loralee Pacas, MSW, [place] Office: [phone] Cell: [phone] Fax: [phone]

## 2021-10-21 NOTE — Progress Notes (Signed)
Bowel program completed this shift per md orders. Patient had small sized bowel movement.

## 2021-10-21 NOTE — Discharge Instructions (Addendum)
Inpatient Rehab Discharge Instructions  RAYNOR CALCATERRA Discharge date and time:    Activities/Precautions/ Functional Status: Activity: activity as tolerated Diet:  Wound Care: keep wound clean and dry   Functional status:  ___ No restrictions     ___ Walk up steps independently _X__ 24/7 supervision/assistance   ___ Walk up steps with assistance ___ Intermittent supervision/assistance  ___ Bathe/dress independently ___ Walk with walker     _X__ Bathe/dress with assistance ___ Walk Independently    ___ Shower independently ___ Walk with assistance    ___ Shower with assistance _X__ No alcohol     ___ Return to work/school ________   Special Instructions: Foley care twice a day and after BM or if soiled. Bowel program daily.  Pressure relief measures every 20 minutes.   My questions have been answered and I understand these instructions. I will adhere to these goals and the provided educational materials after my discharge from the hospital.  Patient/Caregiver Signature _______________________________ Date __________  Clinician Signature _______________________________________ Date __________  Please bring this form and your medication list with you to all your follow-up doctor's appointments.    Information on my medicine - ELIQUIS (apixaban)   Why was Eliquis prescribed for you? Eliquis was prescribed to treat blood clots that may have been found in the veins of your legs (deep vein thrombosis) or in your lungs (pulmonary embolism) and to reduce the risk of them occurring again.  What do You need to know about Eliquis ? The dose is ONE 5 mg tablet taken TWICE daily.  Eliquis may be taken with or without food.   Try to take the dose about the same time in the morning and in the evening. If you have difficulty swallowing the tablet whole please discuss with your pharmacist how to take the medication safely.  Take Eliquis exactly as prescribed and DO NOT stop  taking Eliquis without talking to the doctor who prescribed the medication.  Stopping may increase your risk of developing a new blood clot.  Refill your prescription before you run out.  After discharge, you should have regular check-up appointments with your healthcare provider that is prescribing your Eliquis.    What do you do if you miss a dose? If a dose of ELIQUIS is not taken at the scheduled time, take it as soon as possible on the same day and twice-daily administration should be resumed. The dose should not be doubled to make up for a missed dose.  Important Safety Information A possible side effect of Eliquis is bleeding. You should call your healthcare provider right away if you experience any of the following: Bleeding from an injury or your nose that does not stop. Unusual colored urine (red or dark brown) or unusual colored stools (red or black). Unusual bruising for unknown reasons. A serious fall or if you hit your head (even if there is no bleeding).  Some medicines may interact with Eliquis and might increase your risk of bleeding or clotting while on Eliquis. To help avoid this, consult your healthcare provider or pharmacist prior to using any new prescription or non-prescription medications, including herbals, vitamins, non-steroidal anti-inflammatory drugs (NSAIDs) and supplements.  This website has more information on Eliquis (apixaban): http://www.eliquis.com/eliquis/home

## 2021-10-21 NOTE — Progress Notes (Signed)
Speech Language Pathology Note  Patient Details  Name: JATHNIEL SMELTZER MRN: [id number] Date of Birth: Aug 20, 1955 Today's Date: 10/21/2021  [name] reduced time frequency from x5 a week to x1-3 a week, due to pt meeting goals faster than anticipated and demonstrating great carryover.   Vaneta Hammontree  [facility] 10/21/2021, 1:57 PM

## 2021-10-21 NOTE — Progress Notes (Signed)
Occupational Therapy Session Note  Patient Details  Name: Jason [name] MRN: [id number] Date of Birth: 1955/02/27  Today's Date: 10/21/2021 OT Individual Time: 2103-1281 & 1400-1430 OT Individual Time Calculation (min): 75 min & 30 min   Short Term Goals: Week 1:  OT Short Term Goal 1 (Week 1): Pt will perform simple self feeding with mod A with AE and compensatory set up OT Short Term Goal 2 (Week 1): Pt will complete UB bathing supported sitting at sink wiht mod A OT Short Term Goal 3 (Week 1): Pt will complete pull over shirt donning with adapted strategies with mod A OT Short Term Goal 4 (Week 1): Pt will perform sitting EOB with mod A for UE simple functional or reaching task  Skilled Therapeutic Interventions/Progress Updates:   Session 1: Upon OT arrival, pt semi recumbent in bed with spouse [name] present. Pt agreeable to OT treatment and reports no pain. Treatment intervention with a focus on self care retraining, sitting tolerance, and UE ROM/strengthening and positioning. Pt completes grooming bed level, and dressing edge of bed at the levels below with assist of 2 helpers for sitting balance with Max A. Pt was provided verbal cues for hemi dressing technique secondary to L shoulder injury. Increased time and rest breaks needed to complete. Pants were managed with rolling to L and R side with Total A x 2. While supine, pt completes AAROM exercises in gravity eliminated plane. Pt completes 2x10 reps of chest press, elbow flex/ext, supination/pronation, wrist ext/flex, and shoulder elevation/depression.Verbal cues required for breathing technique during exercises. Pt socks were doffed with Total A and moon boots applied with Total A. Pt was positioned with pillows and pressure relief to R hip. Pt was left in bed at end of session with all needs met and safety measures in place.    Session 2: Upon OT arrival, pt semi recumbent in bed. Pt agreeable to OT treatment session and reports  no pain. Treatment intervention with a focus on fine motor coordination, strengthening, and discharge planning. Pt was provided with soft grade theraputty with 10 beads to retrieve using B UE. Pt demonstrates mod difficulty to manipulate putty requiring increased time to retrieve beads. Pt able to retrieve 4 beads and place into container. Pt reports hands fatigued at end of session. Discussed pt's bathroom setup with pt and spouse and spouse reports tub/shower combo without DME. Tub transfer bench was recommended to pt's spouse. Pt was left in bed at end of session with all needs met and safety measures in place.   Therapy Documentation Precautions:  Precautions Precautions: Fall, Cervical Precaution Comments: C6 laminectomy, MAP >65 Restrictions Weight Bearing Restrictions: No   ADL: Grooming: Moderate assistance (oral care with electric toothbrush) Where Assessed-Grooming: Bed level Upper Body Dressing: Maximal assistance (assist of 2 helpers for sitting balance support) Where Assessed-Upper Body Dressing: Edge of bed Lower Body Dressing: Maximal assistance (assist of 2 helpers for sitting balance) Where Assessed-Lower Body Dressing: Edge of bed   Therapy/Group: Individual Therapy  Laylamarie Meuser 10/21/2021, 11:05 AM

## 2021-10-22 ENCOUNTER — Ambulatory Visit: Payer: 59

## 2021-10-22 ENCOUNTER — Encounter (HOSPITAL_COMMUNITY): Payer: Self-pay | Admitting: Physical Medicine and Rehabilitation

## 2021-10-22 ENCOUNTER — Encounter: Payer: Self-pay | Admitting: Neurosurgery

## 2021-10-22 NOTE — Consult Note (Signed)
Neuropsychological Consultation   Patient:   Jason [name]   DOB:   03/01/1955  MR Number:  [id number]  Location:  [facility] [id number] [place] [address] Dept: [place]: [phone]           Date of Service:   10/22/2021  Start Time:   3:30 PM End Time:   4:30 PM  Provider/Observer:  Ilean Skill, Psy.D.       Clinical Neuropsychologist       Billing Code/Service: 09326  Chief Complaint:    CAS TRACZ is a 66 year old right handed male with a past medical history including hypertension, chronic neck pain, OA s/p left RTC repair on 04/2021.  Patient has had ongoing weakness and tingling down first and second digit left hand which progressed to right upper extremity weakness.  Patient was subsequently seen in ED for sedation to allow for MRI C-spine due to significant claustrophobia.  This evaluation identified concerns for metastatic disease with neurosurgical interventions for C6 cervical laminectomy for resection of tumor.  On next day patient developed significant bilateral upper and lower extremity weakness with hypotension requiring transfer to ICU with further MRIs identified more metastatic sites.  Patient with extensive work-up.  Patient ultimately transferred to comprehensive inpatient rehabilitation due to functional deficits secondary to spinal cord injury.  Reason for Service:  Patient was referred for neuropsychological consultation due to coping and adjustment issues.  There has been hospice consultation but patient is focused on improving to the point that he can be transferred home and wife is agreeing to take care of him under any circumstance.  Below is the HPI for the current admission.  HPI: Jason [name] is a 66 year old R handed male with history of HTN, chronic neck pain, OA s/p left RTC repair 04/2021 with ongoing weakness and tingling down to 1st  and 2nd digit of left hand which progress to RUE weakness. He was sent to ED for sedation prior to MRI C spine 10/03/21 for work up which showed abnormal marrow edema with diffuse involvement of C4  into thoracic spine, marked canal stenosis greatest at C6, L> R foraminal effacement due to extraosseous disease and concerns of metastatic disease v/s infiltrative marrow neoplastic process. He presented to ED on 10/04/21 for further evaluation and Abdominal pelvic CT done revealing moderate severity retroperitoneal and pelvic lymphadenopathy c/w metastatic disease, small lytic areas L4 and S1, right 2nd and 4th ribs, sacrum and left iliac bone. He was taken to OR on 08/13 by Dr. Cari Caraway for C6 cervical laminectomy for resection of tumor. On am of 08/14, he developed significant BUE/BLE weakness with hypotension requiring transfer to ICU, pressors as well as steroids for BP supported.   He was vented (due for repeat MRI due to severe claustrophobia. MRI C spine revealed similar degree of epidural tumor C4-C7, progressive canal stenosis form C4-C7 without abnormal cord signal or infarct.   MRI head, spine showed calvarial metastatic disease in right temporal region and vertex with intracranial extension of tumor along right frontal convexity without mass effect and extracranial extension of tumor into scalp along vertex and small focus in right parietal cortex felt to be small acute infarct. MRI thoracic/lumbar spine revealed extensive osseous metastatic disease throughout thoracolumbar spine and pelvis, thin circumferential epidural tumor cervicothoracic junction extending inferiorly and of the field-of-view to T3-T4, epidural tumor along posterior endplates at T9, Z12 and T12, extensive  epidural tumor in lumbar spine with greatest thickness at L1 without high-grade stenosis however superimposed on pre-existing degenerative changes in lumbar spine with severe spinal canal stenosis with cauda equina nerve root  compression L3-L5, extra-medullary fluid collection along dorsal aspect of cord beginning at T9/T10 extending into lumbar spine likely subdural in location displacing cord and cauda equina nerve roots without compression Or signal abnormality and prevertebral edema and cervical spine which could be postsurgical in nature.  Incidental findings of new bilateral lower lobe consolidation question developing pneumonia v/s aspiration or atelectasis.  He was extubated without difficulty post imaging studies.  .   Dr. 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 He recommended Firmagon loading dose while awaiting final path as well as outpatient PSMA PET scan for full work up. Dr. Donella Stade with Radiation Onc recommended starting palliative XRT to cervical spine but to be delayed till labor day per Dr. Maryann Alar. Dr. Quinn Axe w/neuro also consulted for input on small parietal stroke seen on MRI brain and recommended avoiding hypotension as well as low dose ASA on hgb stabilizes. Palliative care has discussed [place] with wife who will discuss it with patient "when he is able to discuss it" and currently on full scope of treatment. He developed fever with somnolence, acute hypoxic respiratory failure and tachycardia on 08/19 and IV antibiotics added due to concerns of aspiration PNA.  AKI was treated with IVF and he was transfused with one unit PRBC due to drop in Hgb to 7.7.  Oxygen needs decreasing form to 2 L per [place] with improvement in secretions management. Lovenox added for DVT prophylaxis 08/17 and Unasyn transitioned to augmetin today for 5 additional days of antibiotic coverage. Steroids have been weaned off.    ST evaluation without overt s/s of aspiration and general swallow precautions/implications of fatigue with recs to stop therapy 1 hour before meals to allow to rest for conservation energy for meals-->D3 w/chopped meats and rest breaks during meals and staff  assist w/meals. 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 CIR recommended due to functional decline.    Plans for XRT simulation on 10/24/21@ 2 pm by Dr. Tammi Klippel   Pt reports started bowel program last night- went OK- feeling less constipated.  Sleeping well.  Having a lot of RLE> LLE spasms but not painful. Not having a lot of pain.  Breathing better since yesterday.  Has foley since unable to void.  Anxious about therapy.   Current Status:  Patient was oriented x4 and rather dysphoric mood with brief episodic significant emotional response during our discussions.  Wife was present but allowed for most of the discussions to be between the patient myself.  She entered into the conversations at appropriate times.  While most of the discussions around future expectations were rather vague and nonspecific from the patient and his wife there was clear indications that they were at least intellectually aware of what to expect going forward.  However, most of the focus for the patient was on getting the house prepared and what needed to be done for his return home but he was aware that he would have significant residual motor deficits and medical complications.  Patient's wife was emphatic that she would do what ever was needed to allow him to come home and stay at home.  It was clear that  they did not want to go into direct discussions about palliative care although it was openly discussed as far as the concept for a brief period of time.  The patient displayed good cognition and understanding and is certainly competent to make decisions although the suddenness of his complications and diagnoses have been difficult to manage with increased anxiety and stress for the patient and his wife.  By the time he was aware that he had prostate cancer there were already metastasis in his spine and multiple other  areas that have been recently identified.  Behavioral Observation: MAUI BRITTEN  presents as a 66 y.o.-year-old Right handed Caucasian Male who appeared his stated age. his dress was Appropriate and he was Well Groomed and his manners were Appropriate to the situation.  his participation was indicative of Appropriate and Redirectable behaviors.  There were physical disabilities noted.  he displayed an appropriate level of cooperation and motivation.     Interactions:    Active Appropriate  Attention:   within normal limits and although the patient was distracted by internal preoccupations and emotional responses at time.  Memory:   within normal limits; recent and remote memory intact  Visuo-spatial:  not examined  Speech (Volume):  normal  Speech:   normal; normal  Thought Process:  Coherent and Relevant  Though Content:  WNL; not suicidal and not homicidal  Orientation:   person, place, time/date, and situation  Judgment:   Fair  Planning:   Fair  Affect:    Anxious and Tearful  Mood:    Dysphoric  Insight:   Good  Intelligence:   normal    Medical History:   Past Medical History:  Diagnosis Date   Allergy    Arthritis    osteoarthritis   GERD (gastroesophageal reflux disease)    Hypertension    Hypothyroidism    Renal calculi    Thyroid disease    Hyperthyroidism s/p radioactive iodine ablation         Patient Active Problem List   Diagnosis Date Noted   Acute incomplete quadriplegia ([facility]) 10/18/2021   Anemia    Transaminitis    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 ([place]) 10/12/2021   Prostate cancer metastatic to bone ([facility]) 10/12/2021   Acute respiratory failure with hypoxia and hypercapnia ([facility]) 10/12/2021   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 ([place]) 10/12/2021   Anxiety 10/12/2021   Palliative care encounter    Prostate cancer metastatic to multiple sites [facility])    Hypotension and abnormal strength/sensation   10/07/2021   Elevated PSA    Abnormal MRI    Goals of care, counseling/discussion    Cervical spinal stenosis 10/05/2021   Metastatic disease ([place]) 10/05/2021   Hypertensive urgency 10/05/2021   Left shoulder pain 03/29/2021   PSVT (paroxysmal supraventricular tachycardia) ([place]) 01/25/2021   NSVT (nonsustained ventricular tachycardia) ([place]) 01/25/2021   Hyperlipidemia 01/25/2021   Acute non-recurrent frontal sinusitis 12/27/2020   PVC's (premature ventricular contractions) 03/15/2020   Palpitations 03/14/2020   BPH (benign prostatic hyperplasia) 11/07/2013   Routine general medical examination at a health care facility 06/24/2010   Hypothyroidism 06/19/2009   OSTEOARTHRITIS 12/19/2008   BACK PAIN, LUMBAR 02/26/2007   Essential hypertension 09/17/2006   ALLERGIC RHINITIS 09/17/2006   GERD 09/17/2006    Psychiatric History:  No prior psychiatric history noted although the patient has had adjustment reactions with anxiety and depressive symptoms during his hospitalization.  Family Med/Psych History:  Family History  Problem  Relation Age of Onset   Hypertension Mother    Stroke Mother    Gout Father    Heart attack Father 45   Heart disease Sister    Colon cancer Neg Hx    Esophageal cancer Neg Hx    Rectal cancer Neg Hx    Stomach cancer Neg Hx     Impression/DX:  MIROSLAV GIN is a 66 year old right handed male with a past medical history including hypertension, chronic neck pain, OA s/p left RTC repair on 04/2021.  Patient has had ongoing weakness and tingling down first and second digit left hand which progressed to right upper extremity weakness.  Patient was subsequently seen in ED for sedation to allow for MRI C-spine due to significant claustrophobia.  This evaluation identified concerns for metastatic disease with neurosurgical interventions for C6 cervical laminectomy for resection of tumor.  On next day patient developed significant bilateral upper and lower extremity  weakness with hypotension requiring transfer to ICU with further MRIs identified more metastatic sites.  Patient with extensive work-up.  Patient ultimately transferred to comprehensive inpatient rehabilitation due to functional deficits secondary to spinal cord injury.  Patient was oriented x4 and rather dysphoric mood with brief episodic significant emotional response during our discussions.  Wife was present but allowed for most of the discussions to be between the patient myself.  She entered into the conversations at appropriate times.  While most of the discussions around future expectations were rather vague and nonspecific from the patient and his wife there was clear indications that they were at least intellectually aware of what to expect going forward.  However, most of the focus for the patient was on getting the house prepared and what needed to be done for his return home but he was aware that he would have significant residual motor deficits and medical complications.  Patient's wife was emphatic that she would do what ever was needed to allow him to come home and stay at home.  It was clear that they did not want to go into direct discussions about palliative care although it was openly discussed as far as the concept for a brief period of time.  The patient displayed good cognition and understanding and is certainly competent to make decisions although the suddenness of his complications and diagnoses have been difficult to manage with increased anxiety and stress for the patient and his wife.  By the time he was aware that he had prostate cancer there were already metastasis in his spine and multiple other areas that have been recently identified  Disposition/Plan:  Today we worked on coping and adjustment issues with issues related to his spinal cord injury and resulting in severe motor deficits and loss of function in the setting of metastatic cancer.  Diagnosis:    Adjustment reaction  with mixed anxiety and depressive symptomatology.         Electronically Signed   _______________________ Ilean Skill, Psy.D. Clinical Neuropsychologist

## 2021-10-22 NOTE — Consult Note (Cosign Needed)
[place]  [phone] [phone]   MEDICAL ONCOLOGY - INITIAL CONSULTATION  Referral MD: Dr. Earlie Server  Reason for Referral: Metastatic prostate cancer  HPI: Jason [name] is a 66 year old male with a past medical history significant for hypertension, hypothyroidism, GERD, osteoarthritis.  He initially presented to [facility] with chronic neck pain with paresthesias in his left hand.  He had had an MRI performed as an outpatient which showed diffuse involvement of C4 into the thoracic spine with associated epidural disease with marked canal stenosis without cord compression.  Abdominal imaging performed at [facility] showed moderate severity retroperitoneal and pelvic lymphadenopathy, small lytic lesions at levels L4 and S1 with diffusely sclerotic changes involving the second and fourth right ribs, sacrum, left iliac bone.  He underwent C6 cervical laminectomy for resection of the epidural tumor on 10/06/2021 and plan is for palliative radiation to the spine.  Final pathology showed metastatic adenocarcinoma compatible with prostatic primary.  PSA obtained on 10/05/2021 was greater than 1500.  He was seen by medical oncology during his hospitalization and was started on Firmagon 240 mg loading dose on 10/09/2021.  The patient was transferred to inpatient rehab at [facility] on 10/18/2021.  Estimated stay 3 to 4 weeks at this point.  He is due for Eligard around 11/06/2021.  I met with the patient and his wife in his room.  The patient has been working with therapy as much as possible.  States that he is tired after working with them.  Has been transferring from bed to wheelchair with assistance of staff.  Today, he reports hot flashes secondary to prior Firmagon injection.  He is not having any headaches or dizziness.  He denies chest pain or shortness of breath.  No abdominal pain, nausea, vomiting reported.  Medical oncology was asked to see the patient to make recommendations  regarding his metastatic prostate cancer.  Past Medical History:  Diagnosis Date   Allergy    Arthritis    osteoarthritis   GERD (gastroesophageal reflux disease)    Hypertension    Hypothyroidism    Renal calculi    Thyroid disease    Hyperthyroidism s/p radioactive iodine ablation  :   Past Surgical History:  Procedure Laterality Date   CARDIAC CATHETERIZATION  03/2000   HERNIA REPAIR  0109   umbilical   LEFT HEART CATH AND CORONARY ANGIOGRAPHY Left 03/20/2020   Procedure: LEFT HEART CATH AND CORONARY ANGIOGRAPHY;  Surgeon: Nelva Bush, MD;  Location: [place] [facility];  Service: Cardiovascular;  Laterality: Left;   POSTERIOR CERVICAL FUSION/FORAMINOTOMY N/A 10/06/2021   Procedure: POSTERIOR CERVICAL FUSION/ FORAMINOTOMY LEVEL 3;  Surgeon: Meade Maw, MD;  Location: [facility];  Service: Neurosurgery;  Laterality: N/A;  will need monitoring   RADIOLOGY WITH ANESTHESIA Left 04/18/2021   Procedure: MRI SHOULDER WITHOUT CONTRAST WITH ANESTHESIA;  Surgeon: Radiologist, Medication, MD;  Location: [place];  Service: Radiology;  Laterality: Left;   RADIOLOGY WITH ANESTHESIA N/A 10/03/2021   Procedure: MRI CERVICAL SPINE WITH ANESTHESIA;  Surgeon: Radiologist, Medication, MD;  Location: [place];  Service: Radiology;  Laterality: N/A;   REVERSE SHOULDER ARTHROPLASTY Left    ROTATOR CUFF REPAIR  11/2010   Dr [name] Heck   TONSILLECTOMY     TOTAL HIP ARTHROPLASTY Right 02/24/2009   TOTAL HIP ARTHROPLASTY Left 06/2016   Dr Harlow Mares  :   Current Facility-Administered Medications  Medication Dose Route Frequency Provider Last Rate Last Admin   (feeding supplement) PROSource Plus liquid 30  mL  30 mL Oral TID BM Love, Pamela S, PA-C   30 mL at 10/22/21 0947   acetaminophen (TYLENOL) tablet 325-650 mg  325-650 mg Oral Q4H PRN Bary Leriche, PA-C       alum & mag hydroxide-simeth (MAALOX/MYLANTA) 200-200-20 MG/5ML suspension 30 mL  30 mL Oral Q4H PRN Love, Pamela S, PA-C       ascorbic  acid (VITAMIN C) tablet 500 mg  500 mg Oral Daily Bary Leriche, PA-C   500 mg at 10/22/21 0962   aspirin EC tablet 81 mg  81 mg Oral Daily Bary Leriche, PA-C   81 mg at 10/22/21 0912   atorvastatin (LIPITOR) tablet 40 mg  40 mg Oral Daily Bary Leriche, PA-C   40 mg at 10/22/21 8366   bisacodyl (DULCOLAX) suppository 10 mg  10 mg Rectal Daily PRN Love, Pamela S, PA-C       bisacodyl (DULCOLAX) suppository 10 mg  10 mg Rectal QPC supper Love, Pamela S, PA-C   10 mg at 10/21/21 1840   budesonide (PULMICORT) nebulizer solution 0.5 mg  0.5 mg Nebulization BID Love, Pamela S, PA-C   0.5 mg at 10/22/21 2947   Chlorhexidine Gluconate Cloth 2 % PADS 6 each  6 each Topical BID Lovorn, Jinny Blossom, MD   6 each at 10/22/21 0521   clonazePAM (KLONOPIN) tablet 0.5 mg  0.5 mg Oral BID Love, Pamela S, PA-C   0.5 mg at 10/22/21 0912   dextromethorphan-guaiFENesin ([place] DM) 30-600 MG per 12 hr tablet 1 tablet  1 tablet Oral BID Bary Leriche, PA-C   1 tablet at 10/22/21 6546   diphenhydrAMINE (BENADRYL) 12.5 MG/5ML elixir 12.5-25 mg  12.5-25 mg Oral Q6H PRN Love, Pamela S, PA-C       docusate sodium (COLACE) capsule 200 mg  200 mg Oral Daily Bary Leriche, PA-C   200 mg at 10/22/21 0912   enoxaparin (LOVENOX) injection 100 mg  100 mg Subcutaneous BID Pham, Minh Q, RPH-CPP   100 mg at 10/22/21 0912   famotidine (PEPCID) tablet 20 mg  20 mg Oral Daily Bary Leriche, PA-C   20 mg at 10/22/21 0912   FLUoxetine (PROZAC) capsule 20 mg  20 mg Oral QHS Love, Pamela S, PA-C   20 mg at 10/21/21 2148   guaiFENesin-dextromethorphan (ROBITUSSIN DM) 100-10 MG/5ML syrup 5-10 mL  5-10 mL Oral Q6H PRN Love, Pamela S, PA-C       ipratropium-albuterol (DUONEB) 0.5-2.5 (3) MG/3ML nebulizer solution 3 mL  3 mL Nebulization Q4H PRN Love, Pamela S, PA-C       ipratropium-albuterol (DUONEB) 0.5-2.5 (3) MG/3ML nebulizer solution 3 mL  3 mL Nebulization BID Lovorn, Megan, MD   3 mL at 10/22/21 0753   iron polysaccharides (NIFEREX)  capsule 150 mg  150 mg Oral Daily Bary Leriche, PA-C   150 mg at 10/22/21 5035   levothyroxine (SYNTHROID) tablet 88 mcg  88 mcg Oral [id number] Bary Leriche, PA-C   88 mcg at 10/22/21 0553   lidocaine (XYLOCAINE) 2 % jelly   Topical PRN Love, Pamela S, PA-C       lip balm (BLISTEX) ointment 1 Application  1 Application Topical PRN Love, Pamela S, PA-C       loratadine (CLARITIN) tablet 10 mg  10 mg Oral BID Love, Pamela S, PA-C   10 mg at 10/22/21 1275   melatonin tablet 5 mg  5 mg Oral QHS PRN Bary Leriche,  PA-C       meloxicam (MOBIC) tablet 15 mg  15 mg Oral Daily Bary Leriche, PA-C   15 mg at 10/21/21 2148   midodrine (PROAMATINE) tablet 5 mg  5 mg Oral TID WC LoveIvan Anchors, PA-C   5 mg at 10/22/21 1223   multivitamin with minerals tablet 1 tablet  1 tablet Oral Daily Bary Leriche, PA-C   1 tablet at 10/22/21 5027   Oral care mouth rinse  15 mL Mouth Rinse PRN Love, Pamela S, PA-C       oxyCODONE-acetaminophen (PERCOCET/ROXICET) 5-325 MG per tablet 1-2 tablet  1-2 tablet Oral Q6H PRN Love, Pamela S, PA-C       polyethylene glycol (MIRALAX / GLYCOLAX) packet 17 g  17 g Oral Daily PRN Love, Pamela S, PA-C       potassium chloride (KLOR-CON M) CR tablet 10 mEq  10 mEq Oral QHS Love, Pamela S, PA-C   10 mEq at 10/21/21 2148   prochlorperazine (COMPAZINE) tablet 5-10 mg  5-10 mg Oral Q6H PRN Reesa Chew S, PA-C       Or   prochlorperazine (COMPAZINE) injection 5-10 mg  5-10 mg Intramuscular Q6H PRN Love, Pamela S, PA-C       Or   prochlorperazine (COMPAZINE) suppository 12.5 mg  12.5 mg Rectal Q6H PRN Love, Pamela S, PA-C       sodium chloride (OCEAN) 0.65 % nasal spray 1 spray  1 spray Each Nare PRN Love, Pamela S, PA-C       sodium phosphate (FLEET) 7-19 GM/118ML enema 1 enema  1 enema Rectal Once PRN Love, Pamela S, PA-C       tamsulosin (FLOMAX) capsule 0.4 mg  0.4 mg Oral QPC supper Love, Pamela S, PA-C   0.4 mg at 10/21/21 1809   traZODone (DESYREL) tablet 25-50 mg  25-50 mg Oral  QHS PRN Love, Pamela S, PA-C         No Known Allergies:   Family History  Problem Relation Age of Onset   Hypertension Mother    Stroke Mother    Gout Father    Heart attack Father 56   Heart disease Sister    Colon cancer Neg Hx    Esophageal cancer Neg Hx    Rectal cancer Neg Hx    Stomach cancer Neg Hx   :   Social History   Socioeconomic History   Marital status: Married    Spouse name: [name]    Number of children: 0   Years of education: Not on file   Highest education level: Not on file  Occupational History   Occupation: Therapist, occupational: [employer]  Tobacco Use   Smoking status: Never   Smokeless tobacco: Never  Vaping Use   Vaping Use: Never used  Substance and Sexual Activity   Alcohol use: Not Currently    Comment: Alcohol once every few weeks/months   Drug use: No   Sexual activity: Yes  Other Topics Concern   Not on file  Social History Narrative   Lives at home with spouse.    Social Determinants of Health   Financial Resource Strain: Not on file  Food Insecurity: Not on file  Transportation Needs: Not on file  Physical Activity: Not on file  Stress: Not on file  Social Connections: Not on file  Intimate Partner Violence: Not on file  :  Review of Systems: A comprehensive 14 point review of systems was  negative except as noted in the HPI.  Exam: Patient Vitals for the past 24 hrs:  BP Temp Temp src Pulse Resp SpO2  10/22/21 0753 -- -- -- -- -- 93 %  10/22/21 0617 127/68 98.1 F (36.7 C) Oral 75 18 98 %  10/21/21 2045 -- -- -- -- -- 96 %  10/21/21 2042 -- -- -- -- -- 95 %  10/21/21 1942 133/87 98.3 F (36.8 C) -- 90 15 94 %  10/21/21 1418 (!) 143/82 98.3 F (36.8 C) Oral 94 (!) 24 96 %    General: Laying in bed, no distress. Eyes:  no scleral icterus.    Respiratory: Respirations even and unlabored. Cardiovascular: No lower extremity edema, compression stockings in place. GI: Soft, nontender. Musculoskeletal:  Noted to have intermittent spasms primarily in his right lower extremity. Skin exam was without echymosis, petichae.   Neuro exam was nonfocal. Patient was alert and oriented.    Lab Results  Component Value Date   WBC 10.8 (H) 10/21/2021   HGB 8.9 (L) 10/21/2021   HCT 27.1 (L) 10/21/2021   PLT 347 10/21/2021   GLUCOSE 95 10/21/2021   CHOL 115 10/17/2021   TRIG 117 10/08/2021   HDL 49 01/29/2021   LDLDIRECT 62 10/10/2021   LDLCALC 62 01/29/2021   ALT 96 (H) 10/18/2021   AST 62 (H) 10/18/2021   NA 139 10/21/2021   K 3.8 10/21/2021   CL 107 10/21/2021   CREATININE 0.51 (L) 10/21/2021   BUN 17 10/21/2021   CO2 24 10/21/2021    VAS [place] LOWER EXTREMITY VENOUS (DVT)  Result Date: 10/19/2021  Lower Venous DVT Study Patient Name:  JOSHA WEEKLEY  Date of Exam:   10/18/2021 Medical Rec #: [id number]         Accession #:    [id number] Date of Birth: Mar 01, 1955         Patient Gender: M Patient Age:   53 years Exam Location:  [facility] Procedure:      VAS [place] LOWER EXTREMITY VENOUS (DVT) Referring Phys: PAMELA LOVE --------------------------------------------------------------------------------  Indications: Quadriplegia.  Comparison Study: 10/11/2021 - No evidence of deep venous thrombosis in either                   lower extremity. Performing Technologist: Oliver Hum RVT  Examination Guidelines: A complete evaluation includes B-mode imaging, spectral Doppler, color Doppler, and power Doppler as needed of all accessible portions of each vessel. Bilateral testing is considered an integral part of a complete examination. Limited examinations for reoccurring indications may be performed as noted. The reflux portion of the exam is performed with the patient in reverse Trendelenburg.  +---------+---------------+---------+-----------+----------+--------------+ RIGHT    CompressibilityPhasicitySpontaneityPropertiesThrombus Aging  +---------+---------------+---------+-----------+----------+--------------+ CFV      Full           Yes      Yes                                 +---------+---------------+---------+-----------+----------+--------------+ SFJ      Full                                                        +---------+---------------+---------+-----------+----------+--------------+ FV Prox  Full                                                        +---------+---------------+---------+-----------+----------+--------------+  FV Mid   Full                                                        +---------+---------------+---------+-----------+----------+--------------+ FV DistalFull                                                        +---------+---------------+---------+-----------+----------+--------------+ PFV      Full                                                        +---------+---------------+---------+-----------+----------+--------------+ POP      Full           Yes      Yes                                 +---------+---------------+---------+-----------+----------+--------------+ PTV      Full                                                        +---------+---------------+---------+-----------+----------+--------------+ PERO     Full                                                        +---------+---------------+---------+-----------+----------+--------------+   +---------+---------------+---------+-----------+----------+--------------+ LEFT     CompressibilityPhasicitySpontaneityPropertiesThrombus Aging +---------+---------------+---------+-----------+----------+--------------+ CFV      Full           Yes      Yes                                 +---------+---------------+---------+-----------+----------+--------------+ SFJ      Full                                                         +---------+---------------+---------+-----------+----------+--------------+ FV Prox  Full                                                        +---------+---------------+---------+-----------+----------+--------------+ FV Mid   Full                                                        +---------+---------------+---------+-----------+----------+--------------+  FV DistalFull                                                        +---------+---------------+---------+-----------+----------+--------------+ PFV      Full                                                        +---------+---------------+---------+-----------+----------+--------------+ POP      Full           Yes      Yes                                 +---------+---------------+---------+-----------+----------+--------------+ PTV      Full                                                        +---------+---------------+---------+-----------+----------+--------------+ PERO     Full                                                        +---------+---------------+---------+-----------+----------+--------------+ Gastroc  None                                         Acute          +---------+---------------+---------+-----------+----------+--------------+     Summary: RIGHT: - There is no evidence of deep vein thrombosis in the lower extremity.  - No cystic structure found in the popliteal fossa.  LEFT: - Findings consistent with acute deep vein thrombosis involving the left gastrocnemius veins. - No cystic structure found in the popliteal fossa.  *See table(s) above for measurements and observations. Electronically signed by Harold Barban MD on 10/19/2021 at 4:13:36 PM.    Final    DG CHEST PORT 1 VIEW  Result Date: 10/17/2021 CLINICAL DATA:  Diarrhea, hypoxia. EXAM: PORTABLE CHEST 1 VIEW COMPARISON:  Chest radiograph dated 10/12/2021. FINDINGS: The heart size and mediastinal contours are within  normal limits. The lung volumes are low. There is mild bibasilar atelectasis. A trace right pleural effusion may contribute. There is no left pleural effusion. There is no pneumothorax. Degenerative changes are seen in the spine. IMPRESSION: Mild bibasilar atelectasis. A trace right pleural effusion may contribute. Electronically Signed   By: Zerita Boers M.D.   On: 10/17/2021 20:08   DG Abd Portable 1V  Result Date: 10/17/2021 CLINICAL DATA:  Diarrhea, hypoxia. EXAM: PORTABLE ABDOMEN - 1 VIEW COMPARISON:  Abdominal radiograph dated 10/12/2021. FINDINGS: The bowel gas pattern is normal. Gas overlies the rectum. No radio-opaque calculi along the urinary tract. Degenerative changes are seen in the spine. Bilateral hip arthroplasties are noted. IMPRESSION: Nonobstructive bowel gas pattern. Electronically Signed   By: Dorothea Ogle  Litton M.D.   On: 10/17/2021 20:07   [place] EKG SITE RITE  Result Date: 10/13/2021 If [facility] image not attached, placement could not be confirmed due to current cardiac rhythm.  DG Chest Port 1 View  Result Date: 10/12/2021 CLINICAL DATA:  Sepsis, fever EXAM: PORTABLE CHEST 1 VIEW COMPARISON:  10/11/2021 FINDINGS: Lung volumes are small with asymmetric right-sided volume loss, unchanged from prior examination. Bibasilar atelectasis noted. No pneumothorax or pleural effusion. Cardiac size is within normal limits. Left internal jugular central venous catheter tip noted at the superior cavoatrial junction. The thoracic aorta is ectatic, similar to prior examination and likely accentuated by poor pulmonary insufflation. Pulmonary vascularity is normal. Left total shoulder arthroplasty has been performed. IMPRESSION: 1. Pulmonary hypoinflation. 2. Bibasilar atelectasis. Electronically Signed   By: Fidela Salisbury M.D.   On: 10/12/2021 20:08   DG Abd 1 View  Result Date: 10/12/2021 CLINICAL DATA:  Constipation EXAM: ABDOMEN - 1 VIEW COMPARISON:  10/07/2021 FINDINGS: No bowel dilatation to  suggest obstruction. No evidence of pneumoperitoneum, portal venous gas or pneumatosis. No pathologic calcifications along the expected course of the ureters. No acute osseous abnormality. IMPRESSION: Negative. Electronically Signed   By: Kathreen Devoid M.D.   On: 10/12/2021 17:49   US Venous Img Lower Bilateral (DVT)  Result Date: 10/11/2021 CLINICAL DATA:  CVA. EXAM: BILATERAL LOWER EXTREMITY VENOUS DOPPLER ULTRASOUND TECHNIQUE: Gray-scale sonography with graded compression, as well as color Doppler and duplex ultrasound were performed to evaluate the lower extremity deep venous systems from the level of the common femoral vein and including the common femoral, femoral, profunda femoral, popliteal and calf veins including the posterior tibial, peroneal and gastrocnemius veins when visible. The superficial great saphenous vein was also interrogated. Spectral Doppler was utilized to evaluate flow at rest and with distal augmentation maneuvers in the common femoral, femoral and popliteal veins. COMPARISON:  None Available. FINDINGS: RIGHT LOWER EXTREMITY Common Femoral Vein: No evidence of thrombus. Normal compressibility, respiratory phasicity and response to augmentation. Saphenofemoral Junction: No evidence of thrombus. Normal compressibility and flow on color Doppler imaging. Profunda Femoral Vein: No evidence of thrombus. Normal compressibility and flow on color Doppler imaging. Femoral Vein: No evidence of thrombus. Normal compressibility, respiratory phasicity and response to augmentation. Popliteal Vein: No evidence of thrombus. Normal compressibility, respiratory phasicity and response to augmentation. Calf Veins: No evidence of thrombus. Normal compressibility and flow on color Doppler imaging. Superficial Great Saphenous Vein: No evidence of thrombus. Normal compressibility. Venous Reflux:  None. Other Findings: No evidence of superficial thrombophlebitis or abnormal fluid collection. LEFT LOWER  EXTREMITY Common Femoral Vein: No evidence of thrombus. Normal compressibility, respiratory phasicity and response to augmentation. Saphenofemoral Junction: No evidence of thrombus. Normal compressibility and flow on color Doppler imaging. Profunda Femoral Vein: No evidence of thrombus. Normal compressibility and flow on color Doppler imaging. Femoral Vein: No evidence of thrombus. Normal compressibility, respiratory phasicity and response to augmentation. Popliteal Vein: No evidence of thrombus. Normal compressibility, respiratory phasicity and response to augmentation. Calf Veins: No evidence of thrombus. Normal compressibility and flow on color Doppler imaging. Superficial Great Saphenous Vein: No evidence of thrombus. Normal compressibility. Venous Reflux:  None. Other Findings: No evidence of superficial thrombophlebitis or abnormal fluid collection. IMPRESSION: No evidence of deep venous thrombosis in either lower extremity. Electronically Signed   By: Aletta Edouard M.D.   On: 10/11/2021 16:57   ECHOCARDIOGRAM LIMITED BUBBLE STUDY  Result Date: 10/11/2021    ECHOCARDIOGRAM LIMITED REPORT   Patient Name:  [name] Date of Exam: 10/11/2021 Medical Rec #:  [id number]        Height:       69.0 in Accession #:    [id number]       Weight:       201.3 lb Date of Birth:  08/18/1955        BSA:          2.072 m Patient Age:    48 years         BP:           140/75 mmHg Patient Gender: M                HR:           103 bpm. Exam Location:  [facility] Procedure: Limited Echo and Saline Contrast Bubble Study Indications:     I63.9 Stroke  History:         Patient has prior history of Echocardiogram examinations, most                  recent 10/08/2021. Risk Factors:Hypertension.  Sonographer:     Charmayne Sheer Referring Phys:  [place] Diagnosing Phys: Ida Rogue MD IMPRESSIONS  1. Left ventricular ejection fraction, by estimation, is 60 to 65%. The left ventricle has normal function. The left  ventricle has no regional wall motion abnormalities. Left ventricular diastolic parameters are indeterminate.  2. Right ventricular systolic function is normal. The right ventricular size is normal  3. Agitated saline contrast bubble study was negative, with no evidence of any interatrial shun  4. Limited study FINDINGS  Left Ventricle: Left ventricular ejection fraction, by estimation, is 60 to 65%. The left ventricle has normal function. The left ventricle has no regional wall motion abnormalities. The left ventricular internal cavity size was normal in size. There is  no left ventricular hypertrophy. Left ventricular diastolic parameters are indeterminate. Right Ventricle: The right ventricular size is normal. No increase in right ventricular wall thickness. Right ventricular systolic function is normal. Left Atrium: Left atrial size was normal in size. Right Atrium: Right atrial size was normal in size. Pericardium: There is no evidence of pericardial effusion. Mitral Valve: The mitral valve was not well visualized. No evidence of mitral valve stenosis. Tricuspid Valve: The tricuspid valve is not well visualized. Tricuspid valve regurgitation is not demonstrated. No evidence of tricuspid stenosis. Aortic Valve: The aortic valve was not well visualized. Aortic valve regurgitation is not visualized. No aortic stenosis is present. Pulmonic Valve: The pulmonic valve was not well visualized. Pulmonic valve regurgitation is not visualized. No evidence of pulmonic stenosis. Aorta: The aortic root was not well visualized. Venous: The pulmonary veins were not well visualized. The inferior vena cava was not well visualized. IAS/Shunts: No atrial level shunt detected by color flow Doppler. Agitated saline contrast was given intravenously to evaluate for intracardiac shunting. Agitated saline contrast bubble study was negative, with no evidence of any interatrial shunt. Ida Rogue MD Electronically signed by Ida Rogue MD Signature Date/Time: 10/11/2021/11:41:14 AM    Final    DG Chest Port 1 View  Result Date: 10/11/2021 CLINICAL DATA:  Respiratory failure.  Metastatic prostate cancer. EXAM: PORTABLE CHEST 1 VIEW COMPARISON:  Chest x-ray dated 10/09/2021. FINDINGS: Cardiomediastinal silhouette is stable in size and configuration. LEFT-sided PICC line is stable in position with tip at the level of the lower SVC/cavoatrial junction. Study is hypoinspiratory with crowding of the bibasilar markings, RIGHT greater than  LEFT. Lungs appear clear. No pleural effusion or pneumothorax is seen. IMPRESSION: Low lung volumes. No evidence of pneumonia or pulmonary edema. Electronically Signed   By: Franki Cabot M.D.   On: 10/11/2021 08:09   CT ANGIO HEAD NECK W WO CM  Result Date: 10/10/2021 CLINICAL DATA:  Left hand paresthesia.  Metastatic prostate cancer. EXAM: CT ANGIOGRAPHY HEAD AND NECK TECHNIQUE: Multidetector CT imaging of the head and neck was performed using the standard protocol during bolus administration of intravenous contrast. Multiplanar CT image reconstructions and MIPs were obtained to evaluate the vascular anatomy. Carotid stenosis measurements (when applicable) are obtained utilizing NASCET criteria, using the distal internal carotid diameter as the denominator. RADIATION DOSE REDUCTION: This exam was performed according to the departmental dose-optimization program which includes automated exposure control, adjustment of the mA and/or kV according to patient size and/or use of iterative reconstruction technique. CONTRAST:  41mL OMNIPAQUE IOHEXOL 350 MG/ML SOLN COMPARISON:  None Available. FINDINGS: CT HEAD FINDINGS Brain: There is no mass, hemorrhage or extra-axial collection. The size and configuration of the ventricles and extra-axial CSF spaces are normal. There is no acute or chronic infarction. The brain parenchyma is normal. Skull: The visualized skull base, calvarium and extracranial soft tissues are  normal. Sinuses/Orbits: No fluid levels or advanced mucosal thickening of the visualized paranasal sinuses. No mastoid or middle ear effusion. The orbits are normal. CTA NECK FINDINGS SKELETON: Numerous sclerotic lesions throughout the cervical spine. OTHER NECK: Normal pharynx, larynx and major salivary glands. No cervical lymphadenopathy. Unremarkable thyroid gland. UPPER CHEST: Right nodal lobe atelectasis and hazy opacities in the right upper lobe. AORTIC ARCH: There is calcific atherosclerosis of the aortic arch. There is no aneurysm, dissection or hemodynamically significant stenosis of the visualized portion of the aorta. Conventional 3 vessel aortic branching pattern. The visualized proximal subclavian arteries are widely patent. RIGHT CAROTID SYSTEM: Normal without aneurysm, dissection or stenosis. LEFT CAROTID SYSTEM: Normal without aneurysm, dissection or stenosis. VERTEBRAL ARTERIES: Right dominant configuration. Both origins are clearly patent. There is no dissection, occlusion or flow-limiting stenosis to the skull base (V1-V3 segments). CTA HEAD FINDINGS POSTERIOR CIRCULATION: --Vertebral arteries: Normal V4 segments. --Inferior cerebellar arteries: Normal. --Basilar artery: Normal. --Superior cerebellar arteries: Normal. --Posterior cerebral arteries (PCA): Normal. ANTERIOR CIRCULATION: --Intracranial internal carotid arteries: Normal. --Anterior cerebral arteries (ACA): Normal. Both A1 segments are present. Patent anterior communicating artery (a-comm). --Middle cerebral arteries (MCA): Normal. VENOUS SINUSES: As permitted by contrast timing, patent. ANATOMIC VARIANTS: None Review of the MIP images confirms the above findings. Review of the MIP images confirms the above findings IMPRESSION: 1. No emergent large vessel occlusion, hemodynamically significant stenosis, aneurysm or dissection of the major cervical or intracranial arteries. 2. Numerous sclerotic lesions throughout the cervical spine,  consistent with osseous metastatic disease. 3. Right middle lobe atelectasis and hazy opacities in the right upper lobe, possibly infection or asymmetric pulmonary edema. 4. Aortic Atherosclerosis (ICD10-I70.0). Electronically Signed   By: Ulyses Jarred M.D.   On: 10/10/2021 21:55   DG Chest Port 1 View  Result Date: 10/09/2021 CLINICAL DATA:  Respiratory failure EXAM: PORTABLE CHEST 1 VIEW COMPARISON:  Film from the previous day. FINDINGS: Cardiac shadow is stable. Endotracheal tube and gastric catheter have been removed in the interval. Left jugular central line is again seen. Lungs are clear. No bony abnormality is noted. IMPRESSION: No acute abnormality noted. Electronically Signed   By: Inez Catalina M.D.   On: 10/09/2021 21:41   MR BRAIN W WO CONTRAST  Result  Date: 10/08/2021 CLINICAL DATA:  Extensive metastatic disease with confusion and lethargy. EXAM: MRI HEAD WITHOUT AND WITH CONTRAST TECHNIQUE: Multiplanar, multiecho pulse sequences of the brain and surrounding structures were obtained without and with intravenous contrast. CONTRAST:  56mL GADAVIST GADOBUTROL 1 MMOL/ML IV SOLN COMPARISON:  None Available. FINDINGS: Brain: There is a small focus of diffusion restriction in the right parietal cortex with faint corresponding FLAIR signal abnormality but no abnormal enhancement most in keeping with a small acute infarct. There is no associated hemorrhage or mass effect. There is no other evidence of acute infarct. There is no acute intracranial hemorrhage or extra-axial fluid collection. Background parenchymal volume is normal. The ventricles are normal in size. Gray-white differentiation is preserved. Scattered small foci of FLAIR signal abnormality in the subcortical and periventricular white matter are nonspecific but likely reflects sequela of mild chronic white matter microangiopathy There is no abnormal parenchymal enhancement there is no solid parenchymal lesion. There is calvarial signal  abnormality bilaterally likely reflecting osseous metastatic disease. There is intracranial enhancement along the right frontal convexity likely reflecting extraosseous tumor measuring up to 5 mm in thickness. There is additional thin pachymeningeal thickening and enhancement over the right cerebral convexity suspicious for additional tumor extension. There is extracranial extension of tumor into the soft tissues of the overlying scalp measuring up to 3 mm in thickness. Additional extracranial tumor is seen along the scalp at the vertex measuring up to 7 mm in thickness. There is no mass effect on the underlying brain parenchyma due to the intracranial tumor. There is no midline shift. Vascular: Normal flow voids. Skull and upper cervical spine: Osseous metastatic disease is seen in the calvarium with extraosseous extension as described above. Sinuses/Orbits: There is extensive fluid in the paranasal sinuses likely related to instrumentation. The globes and orbits are unremarkable. Other: None. IMPRESSION: 1. 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 There is intracranial extension of tumor along the right frontal convexity measuring up to 5 mm in thickness without mass effect on the underlying brain parenchyma or midline shift. Extracranial extension of tumor in this location measures up to approximately 3 mm. 2. Additional extracranial extension of tumor into the scalp along the vertex measuring up to 7 mm in thickness. 3. Small focus of diffusion restriction in the right parietal cortex most in keeping with a small acute infarct. 4. No evidence of parenchymal metastatic disease. Electronically Signed   By: Valetta Mole M.D.   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 Postop day 1 with worsening shock, confusion, lethargy. EXAM: MRI THORACIC AND LUMBAR SPINE WITHOUT AND WITH CONTRAST TECHNIQUE: Multiplanar and multiecho pulse sequences of the thoracic and lumbar spine were obtained without and with intravenous contrast. CONTRAST:  78mL GADAVIST GADOBUTROL 1 MMOL/ML IV SOLN COMPARISON:  Cervical spine MRI dated 1 day prior, CT chest/abdomen/pelvis 10/05/2021 FINDINGS: MRI THORACIC SPINE FINDINGS A single sagittal T2 sequence through the C-spine was obtained. Postsurgical changes reflecting C6 laminectomy are noted. The spinal canal patency is unchanged compared to the postoperative study from 1 day prior. There is no definite cord signal abnormality. There is prevertebral edema which may be postsurgical in nature. Alignment: There is exaggerated thoracic kyphosis. There is no antero or retrolisthesis.  Vertebrae: There is extensive signal abnormality throughout the thoracic spine consistent with extensive osseous metastatic disease. There is no evidence of pathologic fracture. There is breakthrough of the posterior cortex with epidural tumor along the posterior endplates at T9, R42, and T12 without no significant spinal canal stenosis or evidence of cord compression. Circumferential enhancement is also seen in the spinal canal at the cervicothoracic junction extending superiorly off the field of view and inferiorly to the T3-T4 level likely reflecting epidural tumor also without high-grade spinal canal stenosis or cord compression (for example 27-2, 27-6). Cord: Normal in signal and morphology. There is no abnormal cord enhancement. There is scallop shaped T1 isointense material in the dorsal canal along the cord beginning at the T9-T10 level extending inferiorly into the lumbar spine which anteriorly displaces the cord and cauda equina nerve roots, likely subdural in location of uncertain etiology. The collection measures up to 7  mm in maximal thickness in the axial plane (see image 24-33, 20-9). There is no peripheral enhancement to suggest abscess. Paraspinal and other soft tissues: There is consolidation in the lower lobes, right worse than left, new since the prior CT from 08/12. The paraspinal soft tissues are unremarkable. Disc levels: There is overall mild background degenerative change throughout the thoracic spine. There is no significant disc herniation or spinal canal or neural foraminal stenosis. MRI LUMBAR SPINE FINDINGS Segmentation: Standard; the lowest formed disc space is designated L5-S1. Alignment:  There is no significant antero or retrolisthesis. Vertebrae: There is extensive signal abnormality throughout the lumbar spine and imaged pelvis consistent with extensive osseous metastatic disease, most notably at L1 and L2. There is epidural extension of tumor most notably at L1 where it measures up to 5 mm in thickness and at L2 particularly in the region of the right subarticular zone and along the right lamina (7-14); however, circumferential enhancement extends inferiorly throughout the lumbar spine into the sacrum likely reflecting additional epidural tumor. The epidural tumor at L1 results in mild canal narrowing without cord/conus compression. The epidural tumor lower in the lumbar spine superimposed on pre-existing degenerative changes contribute to multilevel spinal canal stenosis as described in detail below. There is no evidence of pathologic fracture. Conus medullaris: Extends to the T12-L1 level. There is crowding of the cauda equina nerve roots due to the above-described presumed subdural fluid collection. There is no abnormal enhancement of the cauda equina nerve roots. Paraspinal and other soft tissues: The paraspinal soft tissues are unremarkable. Disc levels: T12-L1: No significant spinal canal or neural foraminal stenosis L1-L2: Disc degeneration without significant spinal canal or neural foraminal stenosis  L2-L3: Disc degeneration and bilateral facet arthropathy result in mild bilateral subarticular zone narrowing without significant neural foraminal stenosis. L3-L4: There is diffuse disc bulge and moderate bilateral facet arthropathy with ligamentum flavum thickening as well as probable epidural tumor resulting in severe spinal canal stenosis with cauda equina nerve root compression and moderate left and mild right neural foraminal stenosis L4-L5: There is diffuse disc bulge and moderate bilateral facet arthropathy as well as probable epidural tumor most notably along the dorsal canal resulting in severe spinal canal stenosis with cauda equina nerve root compression and moderate left and mild right neural foraminal stenosis L5-S1: There is a diffuse disc bulge and bilateral facet arthropathy as well as epidural tumor along the dorsal and ventral canal resulting in moderate spinal canal stenosis with bilateral subarticular zone narrowing and moderate left worse than right neural foraminal stenosis. IMPRESSION: 1. 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 2. 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 3. Additional epidural tumor along the posterior endplates at T9, Q76, and T12 without high-grade spinal canal stenosis or cord compression. 4. Extensive epidural tumor in the lumbar spine is greatest in thickness at L1 without high-grade spinal canal stenosis at this level; however, superimposed on pre-existing degenerative changes lower in the lumbar spine results in severe spinal canal stenosis with cauda equina nerve root compression at L3-L4 and L4-L5. Multilevel neural foraminal stenosis is detailed above. 5. Postsurgical changes reflecting C6 laminectomy unchanged spinal canal stenosis compared to the  postoperative study from 1 day prior. No definite cord signal abnormality. 6. Extramedullary fluid collection along the dorsal aspect of the cord beginning at T9-T10 extending into the lumbar spine is likely subdural in location but is of uncertain etiology; evolving blood products not excluded. The collection anteriorly displaces the cord and cauda equina nerve roots without frank cord compression or signal abnormality. There is no peripheral enhancement to suggest abscess. 7. Prevertebral edema in the cervical spine which may be postsurgical in nature. 8. New bilateral lower lobe consolidations may reflect atelectasis or developing pneumonia/aspiration. Electronically Signed   By: Valetta Mole M.D.   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 Postop day 1 with worsening shock, confusion, lethargy. EXAM: MRI THORACIC AND LUMBAR SPINE WITHOUT AND WITH CONTRAST TECHNIQUE: Multiplanar and multiecho pulse sequences of the thoracic and lumbar spine were obtained without and with intravenous contrast. CONTRAST:  16mL GADAVIST GADOBUTROL 1 MMOL/ML IV SOLN COMPARISON:  Cervical spine MRI dated 1 day prior, CT chest/abdomen/pelvis 10/05/2021 FINDINGS: MRI THORACIC SPINE FINDINGS A single sagittal T2 sequence through the C-spine was obtained. Postsurgical changes reflecting C6 laminectomy are noted. The spinal canal patency is unchanged compared to the postoperative study from 1 day prior. There is no definite cord signal abnormality. There is prevertebral edema which may be postsurgical in nature. Alignment: There is exaggerated thoracic kyphosis. There is no antero or retrolisthesis. Vertebrae: There is extensive signal abnormality throughout the thoracic spine consistent with  extensive osseous metastatic disease. There is no evidence of pathologic fracture. There is breakthrough of the posterior cortex with epidural tumor along the posterior endplates at T9, A26, and T12 without no significant spinal canal stenosis or evidence of cord compression. Circumferential enhancement is also seen in the spinal canal at the cervicothoracic junction extending superiorly off the field of view and inferiorly to the T3-T4 level likely reflecting epidural tumor also without high-grade spinal canal stenosis or cord compression (for example 27-2, 27-6). Cord: Normal in signal and morphology. There is no abnormal cord enhancement. There is scallop shaped T1 isointense material in the dorsal canal along the cord beginning at the T9-T10 level extending inferiorly into the lumbar spine which anteriorly displaces the cord and cauda equina nerve roots, likely subdural in location of uncertain etiology. The collection measures up to 7 mm in maximal thickness in the axial plane (see image 24-33, 20-9). There is no peripheral enhancement to suggest abscess. Paraspinal and other soft tissues: There is consolidation in the lower lobes, right worse than left, new since the  prior CT from 08/12. The paraspinal soft tissues are unremarkable. Disc levels: There is overall mild background degenerative change throughout the thoracic spine. There is no significant disc herniation or spinal canal or neural foraminal stenosis. MRI LUMBAR SPINE FINDINGS Segmentation: Standard; the lowest formed disc space is designated L5-S1. Alignment:  There is no significant antero or retrolisthesis. Vertebrae: There is extensive signal abnormality throughout the lumbar spine and imaged pelvis consistent with extensive osseous metastatic disease, most notably at L1 and L2. There is epidural extension of tumor most notably at L1 where it measures up to 5 mm in thickness and at L2 particularly in the region of the right subarticular zone and  along the right lamina (7-14); however, circumferential enhancement extends inferiorly throughout the lumbar spine into the sacrum likely reflecting additional epidural tumor. The epidural tumor at L1 results in mild canal narrowing without cord/conus compression. The epidural tumor lower in the lumbar spine superimposed on pre-existing degenerative changes contribute to multilevel spinal canal stenosis as described in detail below. There is no evidence of pathologic fracture. Conus medullaris: Extends to the T12-L1 level. There is crowding of the cauda equina nerve roots due to the above-described presumed subdural fluid collection. There is no abnormal enhancement of the cauda equina nerve roots. Paraspinal and other soft tissues: The paraspinal soft tissues are unremarkable. Disc levels: T12-L1: No significant spinal canal or neural foraminal stenosis L1-L2: Disc degeneration without significant spinal canal or neural foraminal stenosis L2-L3: Disc degeneration and bilateral facet arthropathy result in mild bilateral subarticular zone narrowing without significant neural foraminal stenosis. L3-L4: There is diffuse disc bulge and moderate bilateral facet arthropathy with ligamentum flavum thickening as well as probable epidural tumor resulting in severe spinal canal stenosis with cauda equina nerve root compression and moderate left and mild right neural foraminal stenosis L4-L5: There is diffuse disc bulge and moderate bilateral facet arthropathy as well as probable epidural tumor most notably along the dorsal canal resulting in severe spinal canal stenosis with cauda equina nerve root compression and moderate left and mild right neural foraminal stenosis L5-S1: There is a diffuse disc bulge and bilateral facet arthropathy as well as epidural tumor along the dorsal and ventral canal resulting in moderate spinal canal stenosis with bilateral subarticular zone narrowing and moderate left worse than right neural  foraminal stenosis. IMPRESSION: 1. 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 2. 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 3. Additional epidural tumor along the posterior endplates at T9, T06, and T12 without high-grade spinal canal stenosis or cord compression. 4. Extensive epidural tumor in the lumbar spine is greatest in thickness at L1 without high-grade spinal canal stenosis at this level; however, superimposed on pre-existing degenerative changes lower in the lumbar spine results in severe spinal canal stenosis with cauda equina nerve root compression at L3-L4 and L4-L5. Multilevel neural foraminal stenosis is detailed above. 5. Postsurgical changes reflecting C6 laminectomy unchanged spinal canal stenosis compared to the postoperative study from 1 day prior. No definite cord signal abnormality. 6. Extramedullary fluid collection along the dorsal aspect of the cord beginning at T9-T10 extending into the lumbar spine is likely subdural in location but is of uncertain etiology; evolving blood products not excluded. The collection anteriorly displaces the cord and cauda equina nerve roots without frank cord compression or signal abnormality. There is no peripheral enhancement to  suggest abscess. 7. Prevertebral edema in the cervical spine which may be postsurgical in nature. 8. New bilateral lower lobe consolidations may reflect atelectasis or developing pneumonia/aspiration. Electronically Signed   By: Valetta Mole M.D.   On: 10/08/2021 13:18   ECHOCARDIOGRAM COMPLETE  Result Date: 10/08/2021    ECHOCARDIOGRAM REPORT   Patient Name:   EZIAH NEGRO Date of Exam: 10/08/2021 Medical Rec #:  [id number]        Height:       69.0 in Accession #:    [id number]       Weight:       201.3 lb Date of Birth:   [birth year]        BSA:          2.072 m Patient Age:    24 years         BP:           116/51 mmHg Patient Gender: M                HR:           56 bpm. Exam Location:  [facility] Procedure: 2D Echo, Cardiac Doppler and Color Doppler Indications:     Other abnormalities of the heart 00.8  History:         Patient has prior history of Echocardiogram examinations, most                  recent 07/19/2020. Risk Factors:Hypertension. GERD.  Sonographer:     Sherrie Sport Referring Phys:  [id number] Emeterio Reeve Diagnosing Phys: Isaias Cowman MD  Sonographer Comments: Technically challenging study due to limited acoustic windows, echo performed with patient supine and on artificial respirator, no subcostal window and no parasternal window. IMPRESSIONS  1. Left ventricular ejection fraction, by estimation, is 55 to 60%. The left ventricle has normal function. The left ventricle has no regional wall motion abnormalities. Left ventricular diastolic parameters were normal.  2. Right ventricular systolic function is normal. The right ventricular size is normal.  3. The mitral valve is normal in structure. Mild mitral valve regurgitation. No evidence of mitral stenosis.  4. The aortic valve is normal in structure. Aortic valve regurgitation is not visualized. Mild aortic valve stenosis.  5. The inferior vena cava is normal in size with greater than 50% respiratory variability, suggesting right atrial pressure of 3 mmHg. FINDINGS  Left Ventricle: Left ventricular ejection fraction, by estimation, is 55 to 60%. The left ventricle has normal function. The left ventricle has no regional wall motion abnormalities. The left ventricular internal cavity size was normal in size. There is  no left ventricular hypertrophy. Left ventricular diastolic parameters were normal. Right Ventricle: The right ventricular size is normal. No increase in right ventricular wall thickness. Right ventricular systolic function is normal. Left Atrium: Left  atrial size was normal in size. Right Atrium: Right atrial size was normal in size. Pericardium: There is no evidence of pericardial effusion. Mitral Valve: The mitral valve is normal in structure. Mild mitral valve regurgitation. No evidence of mitral valve stenosis. Tricuspid Valve: The tricuspid valve is normal in structure. Tricuspid valve regurgitation is mild . No evidence of tricuspid stenosis. Aortic Valve: The aortic valve is normal in structure. Aortic valve regurgitation is not visualized. Mild aortic stenosis is present. Aortic valve mean gradient measures 7.0 mmHg. Aortic valve peak gradient measures 11.6 mmHg. Aortic valve area, by VTI measures 2.09 cm. Pulmonic Valve: The pulmonic valve was normal in structure.  Pulmonic valve regurgitation is not visualized. No evidence of pulmonic stenosis. Aorta: The aortic root is normal in size and structure. Venous: The inferior vena cava is normal in size with greater than 50% respiratory variability, suggesting right atrial pressure of 3 mmHg. IAS/Shunts: No atrial level shunt detected by color flow Doppler.  LEFT VENTRICLE PLAX 2D LVIDd:         3.90 cm   Diastology LVIDs:         2.80 cm   LV e' medial:    6.85 cm/s LV PW:         1.00 cm   LV E/e' medial:  15.6 LV IVS:        1.40 cm   LV e' lateral:   12.70 cm/s LVOT diam:     2.10 cm   LV E/e' lateral: 8.4 LV SV:         65 LV SV Index:   31 LVOT Area:     3.46 cm  RIGHT VENTRICLE RV Basal diam:  3.70 cm RV S prime:     16.80 cm/s TAPSE (M-mode): 2.1 cm LEFT ATRIUM             Index        RIGHT ATRIUM           Index LA diam:        4.50 cm 2.17 cm/m   RA Area:     19.50 cm LA Vol (A2C):   62.1 ml 29.98 ml/m  RA Volume:   51.40 ml  24.81 ml/m LA Vol (A4C):   73.2 ml 35.33 ml/m LA Biplane Vol: 70.9 ml 34.22 ml/m  AORTIC VALVE AV Area (Vmax):    1.75 cm AV Area (Vmean):   1.87 cm AV Area (VTI):     2.09 cm AV Vmax:           170.33 cm/s AV Vmean:          115.967 cm/s AV VTI:            0.312 m  AV Peak Grad:      11.6 mmHg AV Mean Grad:      7.0 mmHg LVOT Vmax:         86.20 cm/s LVOT Vmean:        62.500 cm/s LVOT VTI:          0.188 m LVOT/AV VTI ratio: 0.60 MITRAL VALVE                TRICUSPID VALVE MV Area (PHT): 3.15 cm     TR Peak grad:   12.4 mmHg MV Decel Time: 241 msec     TR Vmax:        176.00 cm/s MV E velocity: 107.00 cm/s MV A velocity: 102.00 cm/s  SHUNTS MV E/A ratio:  1.05         Systemic VTI:  0.19 m                             Systemic Diam: 2.10 cm Isaias Cowman MD Electronically signed by Isaias Cowman MD Signature Date/Time: 10/08/2021/1:07:52 PM    Final    DG Chest Port 1 View  Result Date: 10/08/2021 CLINICAL DATA:  Difficulty breathing EXAM: PORTABLE CHEST 1 VIEW COMPARISON:  Previous studies including the examination of 10/07/2021 FINDINGS: Cardiac size is within normal limits. Thoracic aorta is tortuous and ectatic. Tip of endotracheal tube is 4.5  cm above the carina. Enteric tube is noted traversing the esophagus with distal portion of the tube in the stomach. Tip of left IJ central venous catheter is seen in right atrium. There are no signs of pulmonary edema. There is poor inspiration. No new focal pulmonary consolidation is seen. There is improvement in aeration of lower lung fields. There is no significant pleural effusion or pneumothorax. Left shoulder arthroplasty is noted. IMPRESSION: There are no signs of pulmonary edema or focal pulmonary consolidation. There is improvement in aeration of lower lung fields suggesting resolving subsegmental atelectasis. Electronically Signed   By: Elmer Picker M.D.   On: 10/08/2021 08:28   DG Abd 1 View  Result Date: 10/07/2021 CLINICAL DATA:  Nasogastric tube placement. EXAM: ABDOMEN - 1 VIEW COMPARISON:  October 07, 2021 (6:31 p.m.) FINDINGS: A nasogastric tube is seen with its distal tip seen within the body of the stomach. Properly positioned endotracheal tube and left internal jugular venous catheter are  also seen. The bowel gas pattern within the upper abdomen is normal. The remainder of the abdomen and pelvis are not included in the field of view and are limited in evaluation. IMPRESSION: Nasogastric tube positioning, as described above. Electronically Signed   By: Virgina Norfolk M.D.   On: 10/07/2021 21:45   MR CERVICAL SPINE WO CONTRAST  Result Date: 10/07/2021 CLINICAL DATA:  Unable to move legs. Posterior cervical decompression yesterday for metastatic disease. EXAM: MRI CERVICAL SPINE WITHOUT CONTRAST TECHNIQUE: Multiplanar, multisequence MR imaging of the cervical spine was performed. No intravenous contrast was administered. COMPARISON:  CT cervical spine dated October 05, 2021. MRI cervical spine dated October 03, 2021. FINDINGS: Alignment: New straightening of the normal cervical lordosis. No significant listhesis. Vertebrae: Similar abnormal marrow signal involving the C4 posterior elements and C5 through T2 vertebral bodies and posterior elements. Cord: No abnormal signal. Dorsal epidural tumor at C4 is unchanged. Circumferential epidural tumor from C5-C7 appears similar. No definite superimposed epidural fluid collection. Posterior Fossa, vertebral arteries, paraspinal tissues: Postsurgical changes in the paraspinous soft tissues. No fluid collection. Mild prevertebral soft tissue swelling. Disc levels: C2-C3: Negative disc. Unchanged moderate left and mild right facet arthropathy. No stenosis. C3-C4: Unchanged small posterior disc osteophyte complex, mild bilateral uncovertebral hypertrophy, and moderate left and mild right facet arthropathy. Unchanged mild spinal canal stenosis. Unchanged moderate left neuroforaminal stenosis. No right neuroforaminal stenosis. C4-C5: Unchanged mild disc bulging and moderate right facet uncovertebral hypertrophy. Similar dorsal epidural tumor. New moderate spinal canal stenosis. Unchanged mild bilateral neuroforaminal stenosis. C5-C6: Interval bilateral C6  hemilaminectomies. Unchanged bulky posterior disc osteophyte complex and severe bilateral uncovertebral hypertrophy. Similar circumferential epidural tumor extending into both neural foramina. Progressive severe spinal canal stenosis. Unchanged biforaminal effacement. C6-C7: Unchanged small posterior disc osteophyte complex and severe bilateral uncovertebral hypertrophy. Similar circumferential epidural tumor extending into both neural foramina. Progressive severe spinal canal stenosis. Unchanged biforaminal effacement. C7-T1: Negative disc. Unchanged partial effacement of the left neural foramen due to tumor. No spinal canal stenosis. IMPRESSION: 1. Interval bilateral C6 hemilaminectomies. Similar degree of epidural tumor from C4-C7 without definite superimposed epidural hematoma or fluid collection. 2. Progressive spinal canal stenosis from C4-C7, severe at C5-C6 and C6-C7. No abnormal spinal cord signal or cord infarct. 3. Similar diffuse cervical osseous metastatic disease. These results were called by telephone at the time of interpretation on 10/07/2021 at 8:05 pm to provider [facility] , who verbally acknowledged these results. Electronically Signed   By: Titus Dubin M.D.   On:  10/07/2021 20:48   DG Abd 1 View  Result Date: 10/07/2021 CLINICAL DATA:  Orogastric tube placement. EXAM: ABDOMEN - 1 VIEW COMPARISON:  None Available. FINDINGS: An enteric tube is seen with its distal tip approximately 3.6 cm distal to the expected region of the gastroesophageal junction. The bowel gas pattern is normal. A large stool burden is seen within the ascending and proximal transverse colon. No radio-opaque calculi or other significant radiographic abnormality are seen. IMPRESSION: 1. Enteric tube positioning, as described above. Further advancement of the NG tube by proximally 7 cm is recommended to decrease the risk of aspiration. 2. Large stool burden without evidence of bowel obstruction. Electronically  Signed   By: Virgina Norfolk M.D.   On: 10/07/2021 18:45   DG Abd 1 View  Result Date: 10/07/2021 CLINICAL DATA:  Orogastric tube placement. EXAM: ABDOMEN - 1 VIEW COMPARISON:  October 07, 2021 FINDINGS: An enteric tube is seen with its distal tip approximately 3.4 cm distal to the expected region of the gastroesophageal junction. This is unchanged in position when compared to the prior study. The bowel gas pattern is normal. A large stool burden is seen within the ascending and proximal transverse colon. No radio-opaque calculi or other significant radiographic abnormality are seen. IMPRESSION: 1. Enteric tube positioning, as described above. Further advancement of the NG tube by approximately 6 cm is recommended to decrease the risk of aspiration. 2. Large stool burden within the ascending and proximal transverse colon, without evidence of bowel obstruction. Electronically Signed   By: Virgina Norfolk M.D.   On: 10/07/2021 18:43   DG Chest Port 1 View  Result Date: 10/07/2021 CLINICAL DATA:  Hypertension, postoperative day 1 for cervical spine surgery. EXAM: PORTABLE CHEST 1 VIEW COMPARISON:  10/07/2021 4:51 p.m. FINDINGS: An endotracheal tube is present with tip 3.6 cm above the carina. Left central line tip: Lower SVC. A drain is coiled over the right upper chest and lower neck. Reverse left shoulder arthroplasty. Low lung volumes are present, causing crowding of the pulmonary vasculature. Mild bandlike atelectasis in the perihilar regions and bases. No appreciable pneumothorax. There is some vague osseous densities likely reflecting the patient's known osseous metastatic disease. IMPRESSION: 1. Endotracheal tube placed with tip 3.6 cm above the carina. Left central line tip: Lower SVC. No pneumothorax. 2. Low lung volumes are present with scattered perihilar and basilar atelectasis. 3. Hazy osseous densities likely reflecting the patient's known osseous metastatic disease. Electronically Signed   By:  Van Clines M.D.   On: 10/07/2021 18:35   DG Chest Port 1 View  Result Date: 10/07/2021 CLINICAL DATA:  Per MD note, sepsis.  [id number] EXAM: PORTABLE CHEST 1 VIEW COMPARISON:  Chest x-ray 03/11/2020, CT chest 10/05/2021 FINDINGS: Surgical drain and skin staples overly the neck. The heart and mediastinal contours are unchanged. Slightly low lung volumes. Curvilinear densities overlying the right lower lung zone that appear to extend along the anterior ribs likely related to known metastatic osseous disease. No definite focal consolidation. No pulmonary edema. No pleural effusion. No pneumothorax. No acute osseous abnormality. IMPRESSION: 1. Slightly low lung volumes with no acute cardiopulmonary abnormality. 2. Curvilinear densities overlying the right lower lung zone that appear to extend along the anterior ribs likely related to known metastatic osseous disease. Electronically Signed   By: Iven Finn M.D.   On: 10/07/2021 17:09   DG Cervical Spine 2-3 Views  Result Date: 10/06/2021 CLINICAL DATA:  Cervical surgery EXAM: CERVICAL SPINE - 2-3 VIEW COMPARISON:  CT 10/05/2021 FINDINGS: A single C-arm fluoroscopic image was obtained intraoperatively and submitted for post operative interpretation. Lateral image of the cervical spine with tissue spreaders present posteriorly. 2 seconds fluoroscopy time utilized. Radiation dose: 0.9 mGy. Please see the performing provider's procedural report for further detail. IMPRESSION: Intraoperative fluoroscopic image during cervical spine surgery. Electronically Signed   By: Davina Poke D.O.   On: 10/06/2021 09:28   DG C-Arm 1-60 Min-No Report  Result Date: 10/06/2021 Fluoroscopy was utilized by the requesting physician.  No radiographic interpretation.   CT CERVICAL SPINE WO CONTRAST  Result Date: 10/05/2021 CLINICAL DATA:  Metastatic disease of the cervical spine. Unknown primary. EXAM: CT CERVICAL SPINE WITHOUT CONTRAST TECHNIQUE: Multidetector CT  imaging of the cervical spine was performed without intravenous contrast. Multiplanar CT image reconstructions were also generated. RADIATION DOSE REDUCTION: This exam was performed according to the departmental dose-optimization program which includes automated exposure control, adjustment of the mA and/or kV according to patient size and/or use of iterative reconstruction technique. COMPARISON:  MRI of the cervical spine without contrast 10/03/2021 FINDINGS: Alignment: Slight degenerative anterolisthesis again noted at C3-4. Alignment is otherwise anatomic. Reversal of the normal cervical lordosis is noted. Skull base and vertebrae: Craniocervical junction is within normal limits. Extensive heterogeneous sclerotic lesions are present in the cervical spine. Left-sided sclerotic lesions present at C5 and inferiorly at C4 corresponding 2 marrow signal changes on the MRI scan. Diffuse sclerotic changes present at C6 and C7. Mixed sclerotic and lytic changes are present in the posterior elements at C5 and C6. No pathologic fractures are present. Heterogeneous sclerotic changes are present within the spinous process at T1 and T2. Soft tissues and spinal canal: The extraosseous tumor at C5 and C6 is better appreciated on MRI. Prevertebral soft tissues are within normal limits. Disc levels: Central and foraminal narrowing is greatest at C5-6 and C6-7 is described on the MRI scan. Upper chest: Lung apices are clear. IMPRESSION: 1. Extensive heterogeneous sclerotic lesions throughout the cervical spine compatible with metastatic disease to the bone. 2. Mixed sclerotic and lytic changes in the posterior elements at C5 and C6. 3. Extraosseous tumor at C5 and C6 is better appreciated on the MRI scan. 4. No pathologic fractures. 5. Central and foraminal narrowing is greatest at C5-6 and C6-7 is described on the MRI scan. Electronically Signed   By: San Morelle M.D.   On: 10/05/2021 12:54   CT CHEST ABDOMEN PELVIS W  CONTRAST  Result Date: 10/05/2021 CLINICAL DATA:  Metastatic disease evaluation. EXAM: CT CHEST, ABDOMEN, AND PELVIS WITH CONTRAST TECHNIQUE: Multidetector CT imaging of the chest, abdomen and pelvis was performed following the standard protocol during bolus administration of intravenous contrast. RADIATION DOSE REDUCTION: This exam was performed according to the departmental dose-optimization program which includes automated exposure control, adjustment of the mA and/or kV according to patient size and/or use of iterative reconstruction technique. CONTRAST:  183mL OMNIPAQUE IOHEXOL 300 MG/ML  SOLN COMPARISON:  None Available. FINDINGS: CT CHEST FINDINGS Cardiovascular: No significant vascular findings. Normal heart size. No pericardial effusion. Mediastinum/Nodes: No enlarged mediastinal, hilar, or axillary lymph nodes. Thyroid gland, trachea, and esophagus demonstrate no significant findings. Lungs/Pleura: Mild lingular and mild right middle lobe linear atelectasis is seen. There is no evidence of acute infiltrate, pleural effusion or pneumothorax. Musculoskeletal: A left shoulder replacement is seen with associated streak artifact. Hazy, sclerotic appearing areas are seen involving the anterolateral aspects of the second and fourth right ribs. Multilevel degenerative changes seen throughout the thoracic  spine. CT ABDOMEN PELVIS FINDINGS Hepatobiliary: No focal liver abnormality is seen. No gallstones, gallbladder wall thickening, or biliary dilatation. Pancreas: Unremarkable. No pancreatic ductal dilatation or surrounding inflammatory changes. Spleen: Punctate calcified granulomas are seen within the posterior aspect of an otherwise normal-appearing spleen. Adrenals/Urinary Tract: Adrenal glands are unremarkable. Kidneys are normal, without renal calculi, focal lesion, or hydronephrosis. The urinary bladder is limited in evaluation secondary to the presence of overlying streak artifact. Stomach/Bowel: Stomach  is within normal limits. The appendix is not identified. Stool is seen throughout the large bowel. No evidence of bowel wall thickening, distention, or inflammatory changes. Noninflamed diverticula are seen throughout the sigmoid colon. Vascular/Lymphatic: Aortic atherosclerosis. Moderate severity Para-aortic and aortocaval lymphadenopathy is seen. This extends into the pelvis along the bilateral iliac chains, right greater than left Reproductive: The prostate gland is markedly limited in evaluation secondary to the presence of overlying streak artifact. Other: No abdominal wall hernia or abnormality. No abdominopelvic ascites. 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 There is an extensive amount of associated streak artifact with subsequently limited evaluation of the adjacent osseous and soft tissue structures. Small lytic areas are seen at the levels of L4 and S1, with diffusely sclerotic changes seen throughout the remainder of the sacrum and left iliac bone. A mixed cystic and sclerotic lesion is seen within the right iliac bone. Multilevel degenerative changes are seen throughout the lumbar spine. IMPRESSION: 1. Moderate severity retroperitoneal and pelvic lymphadenopathy, consistent with metastatic disease. 2. Small lytic areas at the levels of L4 and S1, with diffusely sclerotic changes involving the second and fourth right ribs, sacrum and left iliac bone. 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 Further evaluation with a whole body nuclear medicine bone scan is recommended. 3. 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 4. Sigmoid diverticulosis. 5. Aortic atherosclerosis. Aortic Atherosclerosis (ICD10-I70.0). Electronically Signed   By: Virgina Norfolk M.D.   On: 10/05/2021 01:56   MR CERVICAL SPINE WO CONTRAST  Result Date: 10/03/2021 CLINICAL DATA:  Neck and left shoulder pain EXAM: MRI CERVICAL SPINE WITHOUT  CONTRAST TECHNIQUE: Multiplanar, multisequence MR imaging of the cervical spine was performed. No intravenous contrast was administered. COMPARISON:  None Available. FINDINGS: Alignment: Reversal of cervical lordosis. Mild anterolisthesis at C3-C4. Vertebrae: Multifocal abnormal marrow signal particularly involving C4-C7 and included upper thoracic vertebral bodies. This includes the posterior elements. Dorsal epidural soft tissue is present at C4. Circumferential involvement is present at C5-C7. There is marked canal stenosis dorsal to C6 without cord compression. Cord: No abnormal signal. Posterior Fossa, vertebral arteries, paraspinal tissues: Partially imaged paranasal sinus opacification. Disc levels: C2-C3:  Left facet hypertrophy.  No canal or foraminal stenosis. C3-C4: Disc bulge with endplate osteophytes. Left facet hypertrophy. Effacement of the ventral subarachnoid space without significant canal narrowing. No foraminal stenosis. C4-C5: Disc bulge with superimposed small right foraminal protrusion and endplate osteophytes. Left greater than right facet hypertrophy. Dorsal epidural soft tissue. No canal stenosis. No left foraminal stenosis. Probable mild right foraminal stenosis secondary to extraosseous disease. C5-C6: Disc bulge with endplate osteophytes. Facet and uncovertebral hypertrophy. Circumferential epidural soft tissue. Moderate to marked canal stenosis at disc level. Effacement of left greater than right foramina due to extraosseous disease. C6-C7: Disc bulge with endplate osteophytes. Uncovertebral and facet hypertrophy. Moderate canal stenosis at disc level. Effacement of left greater than right foramina due to extraosseous disease. C7-T1: No canal or right foraminal stenosis. Partial effacement left foramen due to extraosseous disease.  IMPRESSION: Multifocal abnormal marrow signal with relatively diffuse involvement of C4 into the thoracic spine. Associated epidural disease, greatest at C6  where there is marked canal stenosis without cord compression. Left greater than right foraminal effacement due to extraosseous disease. May reflect metastatic disease or marrow infiltrative neoplastic process. These results will be called to the ordering clinician or representative by the Radiologist Assistant, and communication documented in the PACS or [employer]. Electronically Signed   By: Macy Mis M.D.   On: 10/03/2021 10:28     VAS [place] LOWER EXTREMITY VENOUS (DVT)  Result Date: 10/19/2021  Lower Venous DVT Study Patient Name:  DWAN HEMMELGARN  Date of Exam:   10/18/2021 Medical Rec #: [id number]         Accession #:    [id number] Date of Birth: February 06, 1956         Patient Gender: M Patient Age:   16 years Exam Location:  [facility] Procedure:      VAS [place] LOWER EXTREMITY VENOUS (DVT) Referring Phys: PAMELA LOVE --------------------------------------------------------------------------------  Indications: Quadriplegia.  Comparison Study: 10/11/2021 - No evidence of deep venous thrombosis in either                   lower extremity. Performing Technologist: Oliver Hum RVT  Examination Guidelines: A complete evaluation includes B-mode imaging, spectral Doppler, color Doppler, and power Doppler as needed of all accessible portions of each vessel. Bilateral testing is considered an integral part of a complete examination. Limited examinations for reoccurring indications may be performed as noted. The reflux portion of the exam is performed with the patient in reverse Trendelenburg.  +---------+---------------+---------+-----------+----------+--------------+ RIGHT    CompressibilityPhasicitySpontaneityPropertiesThrombus Aging +---------+---------------+---------+-----------+----------+--------------+ CFV      Full           Yes      Yes                                 +---------+---------------+---------+-----------+----------+--------------+ SFJ      Full                                                         +---------+---------------+---------+-----------+----------+--------------+ FV Prox  Full                                                        +---------+---------------+---------+-----------+----------+--------------+ FV Mid   Full                                                        +---------+---------------+---------+-----------+----------+--------------+ FV DistalFull                                                        +---------+---------------+---------+-----------+----------+--------------+ PFV      Full                                                        +---------+---------------+---------+-----------+----------+--------------+  POP      Full           Yes      Yes                                 +---------+---------------+---------+-----------+----------+--------------+ PTV      Full                                                        +---------+---------------+---------+-----------+----------+--------------+ PERO     Full                                                        +---------+---------------+---------+-----------+----------+--------------+   +---------+---------------+---------+-----------+----------+--------------+ LEFT     CompressibilityPhasicitySpontaneityPropertiesThrombus Aging +---------+---------------+---------+-----------+----------+--------------+ CFV      Full           Yes      Yes                                 +---------+---------------+---------+-----------+----------+--------------+ SFJ      Full                                                        +---------+---------------+---------+-----------+----------+--------------+ FV Prox  Full                                                        +---------+---------------+---------+-----------+----------+--------------+ FV Mid   Full                                                         +---------+---------------+---------+-----------+----------+--------------+ FV DistalFull                                                        +---------+---------------+---------+-----------+----------+--------------+ PFV      Full                                                        +---------+---------------+---------+-----------+----------+--------------+ POP      Full           Yes      Yes                                 +---------+---------------+---------+-----------+----------+--------------+  PTV      Full                                                        +---------+---------------+---------+-----------+----------+--------------+ PERO     Full                                                        +---------+---------------+---------+-----------+----------+--------------+ Gastroc  None                                         Acute          +---------+---------------+---------+-----------+----------+--------------+     Summary: RIGHT: - There is no evidence of deep vein thrombosis in the lower extremity.  - No cystic structure found in the popliteal fossa.  LEFT: - Findings consistent with acute deep vein thrombosis involving the left gastrocnemius veins. - No cystic structure found in the popliteal fossa.  *See table(s) above for measurements and observations. Electronically signed by Harold Barban MD on 10/19/2021 at 4:13:36 PM.    Final    DG CHEST PORT 1 VIEW  Result Date: 10/17/2021 CLINICAL DATA:  Diarrhea, hypoxia. EXAM: PORTABLE CHEST 1 VIEW COMPARISON:  Chest radiograph dated 10/12/2021. FINDINGS: The heart size and mediastinal contours are within normal limits. The lung volumes are low. There is mild bibasilar atelectasis. A trace right pleural effusion may contribute. There is no left pleural effusion. There is no pneumothorax. Degenerative changes are seen in the spine. IMPRESSION: Mild bibasilar atelectasis. A trace right pleural effusion may  contribute. Electronically Signed   By: Zerita Boers M.D.   On: 10/17/2021 20:08   DG Abd Portable 1V  Result Date: 10/17/2021 CLINICAL DATA:  Diarrhea, hypoxia. EXAM: PORTABLE ABDOMEN - 1 VIEW COMPARISON:  Abdominal radiograph dated 10/12/2021. FINDINGS: The bowel gas pattern is normal. Gas overlies the rectum. No radio-opaque calculi along the urinary tract. Degenerative changes are seen in the spine. Bilateral hip arthroplasties are noted. IMPRESSION: Nonobstructive bowel gas pattern. Electronically Signed   By: Zerita Boers M.D.   On: 10/17/2021 20:07   [place] EKG SITE RITE  Result Date: 10/13/2021 If [facility] image not attached, placement could not be confirmed due to current cardiac rhythm.  DG Chest Port 1 View  Result Date: 10/12/2021 CLINICAL DATA:  Sepsis, fever EXAM: PORTABLE CHEST 1 VIEW COMPARISON:  10/11/2021 FINDINGS: Lung volumes are small with asymmetric right-sided volume loss, unchanged from prior examination. Bibasilar atelectasis noted. No pneumothorax or pleural effusion. Cardiac size is within normal limits. Left internal jugular central venous catheter tip noted at the superior cavoatrial junction. The thoracic aorta is ectatic, similar to prior examination and likely accentuated by poor pulmonary insufflation. Pulmonary vascularity is normal. Left total shoulder arthroplasty has been performed. IMPRESSION: 1. Pulmonary hypoinflation. 2. Bibasilar atelectasis. Electronically Signed   By: Fidela Salisbury M.D.   On: 10/12/2021 20:08   DG Abd 1 View  Result Date: 10/12/2021 CLINICAL DATA:  Constipation EXAM: ABDOMEN - 1 VIEW COMPARISON:  10/07/2021 FINDINGS: No bowel dilatation to suggest obstruction. No evidence of  pneumoperitoneum, portal venous gas or pneumatosis. No pathologic calcifications along the expected course of the ureters. No acute osseous abnormality. IMPRESSION: Negative. Electronically Signed   By: Kathreen Devoid M.D.   On: 10/12/2021 17:49   US Venous Img Lower  Bilateral (DVT)  Result Date: 10/11/2021 CLINICAL DATA:  CVA. EXAM: BILATERAL LOWER EXTREMITY VENOUS DOPPLER ULTRASOUND TECHNIQUE: Gray-scale sonography with graded compression, as well as color Doppler and duplex ultrasound were performed to evaluate the lower extremity deep venous systems from the level of the common femoral vein and including the common femoral, femoral, profunda femoral, popliteal and calf veins including the posterior tibial, peroneal and gastrocnemius veins when visible. The superficial great saphenous vein was also interrogated. Spectral Doppler was utilized to evaluate flow at rest and with distal augmentation maneuvers in the common femoral, femoral and popliteal veins. COMPARISON:  None Available. FINDINGS: RIGHT LOWER EXTREMITY Common Femoral Vein: No evidence of thrombus. Normal compressibility, respiratory phasicity and response to augmentation. Saphenofemoral Junction: No evidence of thrombus. Normal compressibility and flow on color Doppler imaging. Profunda Femoral Vein: No evidence of thrombus. Normal compressibility and flow on color Doppler imaging. Femoral Vein: No evidence of thrombus. Normal compressibility, respiratory phasicity and response to augmentation. Popliteal Vein: No evidence of thrombus. Normal compressibility, respiratory phasicity and response to augmentation. Calf Veins: No evidence of thrombus. Normal compressibility and flow on color Doppler imaging. Superficial Great Saphenous Vein: No evidence of thrombus. Normal compressibility. Venous Reflux:  None. Other Findings: No evidence of superficial thrombophlebitis or abnormal fluid collection. LEFT LOWER EXTREMITY Common Femoral Vein: No evidence of thrombus. Normal compressibility, respiratory phasicity and response to augmentation. Saphenofemoral Junction: No evidence of thrombus. Normal compressibility and flow on color Doppler imaging. Profunda Femoral Vein: No evidence of thrombus. Normal compressibility  and flow on color Doppler imaging. Femoral Vein: No evidence of thrombus. Normal compressibility, respiratory phasicity and response to augmentation. Popliteal Vein: No evidence of thrombus. Normal compressibility, respiratory phasicity and response to augmentation. Calf Veins: No evidence of thrombus. Normal compressibility and flow on color Doppler imaging. Superficial Great Saphenous Vein: No evidence of thrombus. Normal compressibility. Venous Reflux:  None. Other Findings: No evidence of superficial thrombophlebitis or abnormal fluid collection. IMPRESSION: No evidence of deep venous thrombosis in either lower extremity. Electronically Signed   By: Aletta Edouard M.D.   On: 10/11/2021 16:57   ECHOCARDIOGRAM LIMITED BUBBLE STUDY  Result Date: 10/11/2021    ECHOCARDIOGRAM LIMITED REPORT   Patient Name:   PAU BANH Date of Exam: 10/11/2021 Medical Rec #:  [id number]        Height:       69.0 in Accession #:    [id number]       Weight:       201.3 lb Date of Birth:  Jul 16, 1955        BSA:          2.072 m Patient Age:    43 years         BP:           140/75 mmHg Patient Gender: M                HR:           103 bpm. Exam Location:  [facility] Procedure: Limited Echo and Saline Contrast Bubble Study Indications:     I63.9 Stroke  History:         Patient has prior history of Echocardiogram examinations, most  recent 10/08/2021. Risk Factors:Hypertension.  Sonographer:     Charmayne Sheer Referring Phys:  [place] Diagnosing Phys: Ida Rogue MD IMPRESSIONS  1. Left ventricular ejection fraction, by estimation, is 60 to 65%. The left ventricle has normal function. The left ventricle has no regional wall motion abnormalities. Left ventricular diastolic parameters are indeterminate.  2. Right ventricular systolic function is normal. The right ventricular size is normal  3. Agitated saline contrast bubble study was negative, with no evidence of any interatrial shun  4. Limited study  FINDINGS  Left Ventricle: Left ventricular ejection fraction, by estimation, is 60 to 65%. The left ventricle has normal function. The left ventricle has no regional wall motion abnormalities. The left ventricular internal cavity size was normal in size. There is  no left ventricular hypertrophy. Left ventricular diastolic parameters are indeterminate. Right Ventricle: The right ventricular size is normal. No increase in right ventricular wall thickness. Right ventricular systolic function is normal. Left Atrium: Left atrial size was normal in size. Right Atrium: Right atrial size was normal in size. Pericardium: There is no evidence of pericardial effusion. Mitral Valve: The mitral valve was not well visualized. No evidence of mitral valve stenosis. Tricuspid Valve: The tricuspid valve is not well visualized. Tricuspid valve regurgitation is not demonstrated. No evidence of tricuspid stenosis. Aortic Valve: The aortic valve was not well visualized. Aortic valve regurgitation is not visualized. No aortic stenosis is present. Pulmonic Valve: The pulmonic valve was not well visualized. Pulmonic valve regurgitation is not visualized. No evidence of pulmonic stenosis. Aorta: The aortic root was not well visualized. Venous: The pulmonary veins were not well visualized. The inferior vena cava was not well visualized. IAS/Shunts: No atrial level shunt detected by color flow Doppler. Agitated saline contrast was given intravenously to evaluate for intracardiac shunting. Agitated saline contrast bubble study was negative, with no evidence of any interatrial shunt. Ida Rogue MD Electronically signed by Ida Rogue MD Signature Date/Time: 10/11/2021/11:41:14 AM    Final    DG Chest Port 1 View  Result Date: 10/11/2021 CLINICAL DATA:  Respiratory failure.  Metastatic prostate cancer. EXAM: PORTABLE CHEST 1 VIEW COMPARISON:  Chest x-ray dated 10/09/2021. FINDINGS: Cardiomediastinal silhouette is stable in size and  configuration. LEFT-sided PICC line is stable in position with tip at the level of the lower SVC/cavoatrial junction. Study is hypoinspiratory with crowding of the bibasilar markings, RIGHT greater than LEFT. Lungs appear clear. No pleural effusion or pneumothorax is seen. IMPRESSION: Low lung volumes. No evidence of pneumonia or pulmonary edema. Electronically Signed   By: Franki Cabot M.D.   On: 10/11/2021 08:09   CT ANGIO HEAD NECK W WO CM  Result Date: 10/10/2021 CLINICAL DATA:  Left hand paresthesia.  Metastatic prostate cancer. EXAM: CT ANGIOGRAPHY HEAD AND NECK TECHNIQUE: Multidetector CT imaging of the head and neck was performed using the standard protocol during bolus administration of intravenous contrast. Multiplanar CT image reconstructions and MIPs were obtained to evaluate the vascular anatomy. Carotid stenosis measurements (when applicable) are obtained utilizing NASCET criteria, using the distal internal carotid diameter as the denominator. RADIATION DOSE REDUCTION: This exam was performed according to the departmental dose-optimization program which includes automated exposure control, adjustment of the mA and/or kV according to patient size and/or use of iterative reconstruction technique. CONTRAST:  79mL OMNIPAQUE IOHEXOL 350 MG/ML SOLN COMPARISON:  None Available. FINDINGS: CT HEAD FINDINGS Brain: There is no mass, hemorrhage or extra-axial collection. The size and configuration of the ventricles and extra-axial  CSF spaces are normal. There is no acute or chronic infarction. The brain parenchyma is normal. Skull: The visualized skull base, calvarium and extracranial soft tissues are normal. Sinuses/Orbits: No fluid levels or advanced mucosal thickening of the visualized paranasal sinuses. No mastoid or middle ear effusion. The orbits are normal. CTA NECK FINDINGS SKELETON: Numerous sclerotic lesions throughout the cervical spine. OTHER NECK: Normal pharynx, larynx and major salivary glands.  No cervical lymphadenopathy. Unremarkable thyroid gland. UPPER CHEST: Right nodal lobe atelectasis and hazy opacities in the right upper lobe. AORTIC ARCH: There is calcific atherosclerosis of the aortic arch. There is no aneurysm, dissection or hemodynamically significant stenosis of the visualized portion of the aorta. Conventional 3 vessel aortic branching pattern. The visualized proximal subclavian arteries are widely patent. RIGHT CAROTID SYSTEM: Normal without aneurysm, dissection or stenosis. LEFT CAROTID SYSTEM: Normal without aneurysm, dissection or stenosis. VERTEBRAL ARTERIES: Right dominant configuration. Both origins are clearly patent. There is no dissection, occlusion or flow-limiting stenosis to the skull base (V1-V3 segments). CTA HEAD FINDINGS POSTERIOR CIRCULATION: --Vertebral arteries: Normal V4 segments. --Inferior cerebellar arteries: Normal. --Basilar artery: Normal. --Superior cerebellar arteries: Normal. --Posterior cerebral arteries (PCA): Normal. ANTERIOR CIRCULATION: --Intracranial internal carotid arteries: Normal. --Anterior cerebral arteries (ACA): Normal. Both A1 segments are present. Patent anterior communicating artery (a-comm). --Middle cerebral arteries (MCA): Normal. VENOUS SINUSES: As permitted by contrast timing, patent. ANATOMIC VARIANTS: None Review of the MIP images confirms the above findings. Review of the MIP images confirms the above findings IMPRESSION: 1. No emergent large vessel occlusion, hemodynamically significant stenosis, aneurysm or dissection of the major cervical or intracranial arteries. 2. Numerous sclerotic lesions throughout the cervical spine, consistent with osseous metastatic disease. 3. Right middle lobe atelectasis and hazy opacities in the right upper lobe, possibly infection or asymmetric pulmonary edema. 4. Aortic Atherosclerosis (ICD10-I70.0). Electronically Signed   By: Ulyses Jarred M.D.   On: 10/10/2021 21:55   DG Chest Port 1 View  Result  Date: 10/09/2021 CLINICAL DATA:  Respiratory failure EXAM: PORTABLE CHEST 1 VIEW COMPARISON:  Film from the previous day. FINDINGS: Cardiac shadow is stable. Endotracheal tube and gastric catheter have been removed in the interval. Left jugular central line is again seen. Lungs are clear. No bony abnormality is noted. IMPRESSION: No acute abnormality noted. Electronically Signed   By: Inez Catalina M.D.   On: 10/09/2021 21:41   MR BRAIN W WO CONTRAST  Result Date: 10/08/2021 CLINICAL DATA:  Extensive metastatic disease with confusion and lethargy. EXAM: MRI HEAD WITHOUT AND WITH CONTRAST TECHNIQUE: Multiplanar, multiecho pulse sequences of the brain and surrounding structures were obtained without and with intravenous contrast. CONTRAST:  20mL GADAVIST GADOBUTROL 1 MMOL/ML IV SOLN COMPARISON:  None Available. FINDINGS: Brain: There is a small focus of diffusion restriction in the right parietal cortex with faint corresponding FLAIR signal abnormality but no abnormal enhancement most in keeping with a small acute infarct. There is no associated hemorrhage or mass effect. There is no other evidence of acute infarct. There is no acute intracranial hemorrhage or extra-axial fluid collection. Background parenchymal volume is normal. The ventricles are normal in size. Gray-white differentiation is preserved. Scattered small foci of FLAIR signal abnormality in the subcortical and periventricular white matter are nonspecific but likely reflects sequela of mild chronic white matter microangiopathy There is no abnormal parenchymal enhancement there is no solid parenchymal lesion. There is calvarial signal abnormality bilaterally likely reflecting osseous metastatic disease. There is intracranial enhancement along the right frontal convexity likely reflecting extraosseous tumor  measuring up to 5 mm in thickness. There is additional thin pachymeningeal thickening and enhancement over the right cerebral convexity suspicious  for additional tumor extension. There is extracranial extension of tumor into the soft tissues of the overlying scalp measuring up to 3 mm in thickness. Additional extracranial tumor is seen along the scalp at the vertex measuring up to 7 mm in thickness. There is no mass effect on the underlying brain parenchyma due to the intracranial tumor. There is no midline shift. Vascular: Normal flow voids. Skull and upper cervical spine: Osseous metastatic disease is seen in the calvarium with extraosseous extension as described above. Sinuses/Orbits: There is extensive fluid in the paranasal sinuses likely related to instrumentation. The globes and orbits are unremarkable. Other: None. IMPRESSION: 1. 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 There is intracranial extension of tumor along the right frontal convexity measuring up to 5 mm in thickness without mass effect on the underlying brain parenchyma or midline shift. Extracranial extension of tumor in this location measures up to approximately 3 mm. 2. Additional extracranial extension of tumor into the scalp along the vertex measuring up to 7 mm in thickness. 3. Small focus of diffusion restriction in the right parietal cortex most in keeping with a small acute infarct. 4. No evidence of parenchymal metastatic disease. Electronically Signed   By: Valetta Mole M.D.   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 Postop day 1 with worsening shock, confusion, lethargy. EXAM: MRI THORACIC AND LUMBAR SPINE WITHOUT AND WITH CONTRAST TECHNIQUE: Multiplanar and multiecho pulse sequences of the thoracic and lumbar spine were obtained without and with intravenous contrast. CONTRAST:  66mL  GADAVIST GADOBUTROL 1 MMOL/ML IV SOLN COMPARISON:  Cervical spine MRI dated 1 day prior, CT chest/abdomen/pelvis 10/05/2021 FINDINGS: MRI THORACIC SPINE FINDINGS A single sagittal T2 sequence through the C-spine was obtained. Postsurgical changes reflecting C6 laminectomy are noted. The spinal canal patency is unchanged compared to the postoperative study from 1 day prior. There is no definite cord signal abnormality. There is prevertebral edema which may be postsurgical in nature. Alignment: There is exaggerated thoracic kyphosis. There is no antero or retrolisthesis. Vertebrae: There is extensive signal abnormality throughout the thoracic spine consistent with extensive osseous metastatic disease. There is no evidence of pathologic fracture. There is breakthrough of the posterior cortex with epidural tumor along the posterior endplates at T9, D53, and T12 without no significant spinal canal stenosis or evidence of cord compression. Circumferential enhancement is also seen in the spinal canal at the cervicothoracic junction extending superiorly off the field of view and inferiorly to the T3-T4 level likely reflecting epidural tumor also without high-grade spinal canal stenosis or cord compression (for example 27-2, 27-6). Cord: Normal in signal and morphology. There is no abnormal cord enhancement. There is scallop shaped T1 isointense material in the dorsal canal along the cord beginning at the T9-T10 level extending inferiorly into the lumbar spine which anteriorly displaces the cord and cauda equina nerve roots, likely subdural in location of uncertain etiology. The collection measures up to 7 mm in maximal thickness in the axial plane (see image 24-33, 20-9). There is no peripheral enhancement to suggest abscess. Paraspinal and other soft tissues: There is consolidation in the  lower lobes, right worse than left, new since the prior CT from 08/12. The paraspinal soft tissues are unremarkable. Disc levels: There  is overall mild background degenerative change throughout the thoracic spine. There is no significant disc herniation or spinal canal or neural foraminal stenosis. MRI LUMBAR SPINE FINDINGS Segmentation: Standard; the lowest formed disc space is designated L5-S1. Alignment:  There is no significant antero or retrolisthesis. Vertebrae: There is extensive signal abnormality throughout the lumbar spine and imaged pelvis consistent with extensive osseous metastatic disease, most notably at L1 and L2. There is epidural extension of tumor most notably at L1 where it measures up to 5 mm in thickness and at L2 particularly in the region of the right subarticular zone and along the right lamina (7-14); however, circumferential enhancement extends inferiorly throughout the lumbar spine into the sacrum likely reflecting additional epidural tumor. The epidural tumor at L1 results in mild canal narrowing without cord/conus compression. The epidural tumor lower in the lumbar spine superimposed on pre-existing degenerative changes contribute to multilevel spinal canal stenosis as described in detail below. There is no evidence of pathologic fracture. Conus medullaris: Extends to the T12-L1 level. There is crowding of the cauda equina nerve roots due to the above-described presumed subdural fluid collection. There is no abnormal enhancement of the cauda equina nerve roots. Paraspinal and other soft tissues: The paraspinal soft tissues are unremarkable. Disc levels: T12-L1: No significant spinal canal or neural foraminal stenosis L1-L2: Disc degeneration without significant spinal canal or neural foraminal stenosis L2-L3: Disc degeneration and bilateral facet arthropathy result in mild bilateral subarticular zone narrowing without significant neural foraminal stenosis. L3-L4: There is diffuse disc bulge and moderate bilateral facet arthropathy with ligamentum flavum thickening as well as probable epidural tumor resulting in severe  spinal canal stenosis with cauda equina nerve root compression and moderate left and mild right neural foraminal stenosis L4-L5: There is diffuse disc bulge and moderate bilateral facet arthropathy as well as probable epidural tumor most notably along the dorsal canal resulting in severe spinal canal stenosis with cauda equina nerve root compression and moderate left and mild right neural foraminal stenosis L5-S1: There is a diffuse disc bulge and bilateral facet arthropathy as well as epidural tumor along the dorsal and ventral canal resulting in moderate spinal canal stenosis with bilateral subarticular zone narrowing and moderate left worse than right neural foraminal stenosis. IMPRESSION: 1. 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 2. 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 3. Additional epidural tumor along the posterior endplates at T9, Y18, and T12 without high-grade spinal canal stenosis or cord compression. 4. Extensive epidural tumor in the lumbar spine is greatest in thickness at L1 without high-grade spinal canal stenosis at this level; however, superimposed on pre-existing degenerative changes lower in the lumbar spine results in severe spinal canal stenosis with cauda equina nerve root compression at L3-L4 and L4-L5. Multilevel neural foraminal stenosis is detailed above. 5. Postsurgical changes reflecting C6 laminectomy unchanged spinal canal stenosis compared to the postoperative study from 1 day prior. No definite cord signal abnormality. 6. Extramedullary fluid collection along the dorsal aspect of the cord beginning at T9-T10 extending into the lumbar spine is likely subdural in location but is of uncertain etiology; evolving blood products not excluded. The collection anteriorly displaces  the cord and cauda equina nerve roots without frank cord  compression or signal abnormality. There is no peripheral enhancement to suggest abscess. 7. Prevertebral edema in the cervical spine which may be postsurgical in nature. 8. New bilateral lower lobe consolidations may reflect atelectasis or developing pneumonia/aspiration. Electronically Signed   By: Valetta Mole M.D.   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 Postop day 1 with worsening shock, confusion, lethargy. EXAM: MRI THORACIC AND LUMBAR SPINE WITHOUT AND WITH CONTRAST TECHNIQUE: Multiplanar and multiecho pulse sequences of the thoracic and lumbar spine were obtained without and with intravenous contrast. CONTRAST:  79mL GADAVIST GADOBUTROL 1 MMOL/ML IV SOLN COMPARISON:  Cervical spine MRI dated 1 day prior, CT chest/abdomen/pelvis 10/05/2021 FINDINGS: MRI THORACIC SPINE FINDINGS A single sagittal T2 sequence through the C-spine was obtained. Postsurgical changes reflecting C6 laminectomy are noted. The spinal canal patency is unchanged compared to the postoperative study from 1 day prior. There is no definite cord signal abnormality. There is prevertebral edema which may be postsurgical in nature. Alignment: There is exaggerated thoracic kyphosis. There is no antero or retrolisthesis. Vertebrae: There is extensive signal abnormality throughout the thoracic spine consistent with extensive osseous metastatic disease. There is no evidence of pathologic fracture. There is breakthrough of the posterior cortex with epidural tumor along the posterior endplates at T9, Z61, and T12 without no significant spinal canal stenosis or evidence of cord compression. Circumferential enhancement is also seen in the spinal  canal at the cervicothoracic junction extending superiorly off the field of view and inferiorly to the T3-T4 level likely reflecting epidural tumor also without high-grade spinal canal stenosis or cord compression (for example 27-2, 27-6). Cord: Normal in signal and morphology. There is no abnormal cord enhancement. There is scallop shaped T1 isointense material in the dorsal canal along the cord beginning at the T9-T10 level extending inferiorly into the lumbar spine which anteriorly displaces the cord and cauda equina nerve roots, likely subdural in location of uncertain etiology. The collection measures up to 7 mm in maximal thickness in the axial plane (see image 24-33, 20-9). There is no peripheral enhancement to suggest abscess. Paraspinal and other soft tissues: There is consolidation in the lower lobes, right worse than left, new since the prior CT from 08/12. The paraspinal soft tissues are unremarkable. Disc levels: There is overall mild background degenerative change throughout the thoracic spine. There is no significant disc herniation or spinal canal or neural foraminal stenosis. MRI LUMBAR SPINE FINDINGS Segmentation: Standard; the lowest formed disc space is designated L5-S1. Alignment:  There is no significant antero or retrolisthesis. Vertebrae: There is extensive signal abnormality throughout the lumbar spine and imaged pelvis consistent with extensive osseous metastatic disease, most notably at L1 and L2. There is epidural extension of tumor most notably at L1 where it measures up to 5 mm in thickness and at L2 particularly in the region of the right subarticular zone and along the right lamina (7-14); however, circumferential enhancement extends inferiorly throughout the lumbar spine into the sacrum likely reflecting additional epidural tumor. The epidural tumor at L1 results in mild canal narrowing without cord/conus compression. The epidural tumor lower in the lumbar spine superimposed on  pre-existing degenerative changes contribute to multilevel spinal canal stenosis as described in detail below. There is no evidence of pathologic fracture. Conus medullaris: Extends  to the T12-L1 level. There is crowding of the cauda equina nerve roots due to the above-described presumed subdural fluid collection. There is no abnormal enhancement of the cauda equina nerve roots. Paraspinal and other soft tissues: The paraspinal soft tissues are unremarkable. Disc levels: T12-L1: No significant spinal canal or neural foraminal stenosis L1-L2: Disc degeneration without significant spinal canal or neural foraminal stenosis L2-L3: Disc degeneration and bilateral facet arthropathy result in mild bilateral subarticular zone narrowing without significant neural foraminal stenosis. L3-L4: There is diffuse disc bulge and moderate bilateral facet arthropathy with ligamentum flavum thickening as well as probable epidural tumor resulting in severe spinal canal stenosis with cauda equina nerve root compression and moderate left and mild right neural foraminal stenosis L4-L5: There is diffuse disc bulge and moderate bilateral facet arthropathy as well as probable epidural tumor most notably along the dorsal canal resulting in severe spinal canal stenosis with cauda equina nerve root compression and moderate left and mild right neural foraminal stenosis L5-S1: There is a diffuse disc bulge and bilateral facet arthropathy as well as epidural tumor along the dorsal and ventral canal resulting in moderate spinal canal stenosis with bilateral subarticular zone narrowing and moderate left worse than right neural foraminal stenosis. IMPRESSION: 1. 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 2. 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  3. Additional epidural tumor along the posterior endplates at T9, N02, and T12 without high-grade spinal canal stenosis or cord compression. 4. Extensive epidural tumor in the lumbar spine is greatest in thickness at L1 without high-grade spinal canal stenosis at this level; however, superimposed on pre-existing degenerative changes lower in the lumbar spine results in severe spinal canal stenosis with cauda equina nerve root compression at L3-L4 and L4-L5. Multilevel neural foraminal stenosis is detailed above. 5. Postsurgical changes reflecting C6 laminectomy unchanged spinal canal stenosis compared to the postoperative study from 1 day prior. No definite cord signal abnormality. 6. Extramedullary fluid collection along the dorsal aspect of the cord beginning at T9-T10 extending into the lumbar spine is likely subdural in location but is of uncertain etiology; evolving blood products not excluded. The collection anteriorly displaces the cord and cauda equina nerve roots without frank cord compression or signal abnormality. There is no peripheral enhancement to suggest abscess. 7. Prevertebral edema in the cervical spine which may be postsurgical in nature. 8. New bilateral lower lobe consolidations may reflect atelectasis or developing pneumonia/aspiration. Electronically Signed   By: Valetta Mole M.D.   On: 10/08/2021 13:18   ECHOCARDIOGRAM COMPLETE  Result Date: 10/08/2021    ECHOCARDIOGRAM REPORT   Patient Name:   VLAD MAYBERRY Date of Exam: 10/08/2021 Medical Rec #:  [id number]        Height:       69.0 in Accession #:    [id number]       Weight:       201.3 lb Date of Birth:  12-29-1955        BSA:          2.072 m Patient Age:    67 years         BP:           116/51 mmHg Patient Gender: M                HR:  56 bpm. Exam Location:  [facility] Procedure: 2D Echo, Cardiac Doppler and Color Doppler Indications:     Other abnormalities of the heart 00.8  History:         Patient has prior history of  Echocardiogram examinations, most                  recent 07/19/2020. Risk Factors:Hypertension. GERD.  Sonographer:     Sherrie Sport Referring Phys:  [id number] Emeterio Reeve Diagnosing Phys: Isaias Cowman MD  Sonographer Comments: Technically challenging study due to limited acoustic windows, echo performed with patient supine and on artificial respirator, no subcostal window and no parasternal window. IMPRESSIONS  1. Left ventricular ejection fraction, by estimation, is 55 to 60%. The left ventricle has normal function. The left ventricle has no regional wall motion abnormalities. Left ventricular diastolic parameters were normal.  2. Right ventricular systolic function is normal. The right ventricular size is normal.  3. The mitral valve is normal in structure. Mild mitral valve regurgitation. No evidence of mitral stenosis.  4. The aortic valve is normal in structure. Aortic valve regurgitation is not visualized. Mild aortic valve stenosis.  5. The inferior vena cava is normal in size with greater than 50% respiratory variability, suggesting right atrial pressure of 3 mmHg. FINDINGS  Left Ventricle: Left ventricular ejection fraction, by estimation, is 55 to 60%. The left ventricle has normal function. The left ventricle has no regional wall motion abnormalities. The left ventricular internal cavity size was normal in size. There is  no left ventricular hypertrophy. Left ventricular diastolic parameters were normal. Right Ventricle: The right ventricular size is normal. No increase in right ventricular wall thickness. Right ventricular systolic function is normal. Left Atrium: Left atrial size was normal in size. Right Atrium: Right atrial size was normal in size. Pericardium: There is no evidence of pericardial effusion. Mitral Valve: The mitral valve is normal in structure. Mild mitral valve regurgitation. No evidence of mitral valve stenosis. Tricuspid Valve: The tricuspid valve is normal in structure.  Tricuspid valve regurgitation is mild . No evidence of tricuspid stenosis. Aortic Valve: The aortic valve is normal in structure. Aortic valve regurgitation is not visualized. Mild aortic stenosis is present. Aortic valve mean gradient measures 7.0 mmHg. Aortic valve peak gradient measures 11.6 mmHg. Aortic valve area, by VTI measures 2.09 cm. Pulmonic Valve: The pulmonic valve was normal in structure. Pulmonic valve regurgitation is not visualized. No evidence of pulmonic stenosis. Aorta: The aortic root is normal in size and structure. Venous: The inferior vena cava is normal in size with greater than 50% respiratory variability, suggesting right atrial pressure of 3 mmHg. IAS/Shunts: No atrial level shunt detected by color flow Doppler.  LEFT VENTRICLE PLAX 2D LVIDd:         3.90 cm   Diastology LVIDs:         2.80 cm   LV e' medial:    6.85 cm/s LV PW:         1.00 cm   LV E/e' medial:  15.6 LV IVS:        1.40 cm   LV e' lateral:   12.70 cm/s LVOT diam:     2.10 cm   LV E/e' lateral: 8.4 LV SV:         65 LV SV Index:   31 LVOT Area:     3.46 cm  RIGHT VENTRICLE RV Basal diam:  3.70 cm RV S prime:     16.80 cm/s TAPSE (  M-mode): 2.1 cm LEFT ATRIUM             Index        RIGHT ATRIUM           Index LA diam:        4.50 cm 2.17 cm/m   RA Area:     19.50 cm LA Vol (A2C):   62.1 ml 29.98 ml/m  RA Volume:   51.40 ml  24.81 ml/m LA Vol (A4C):   73.2 ml 35.33 ml/m LA Biplane Vol: 70.9 ml 34.22 ml/m  AORTIC VALVE AV Area (Vmax):    1.75 cm AV Area (Vmean):   1.87 cm AV Area (VTI):     2.09 cm AV Vmax:           170.33 cm/s AV Vmean:          115.967 cm/s AV VTI:            0.312 m AV Peak Grad:      11.6 mmHg AV Mean Grad:      7.0 mmHg LVOT Vmax:         86.20 cm/s LVOT Vmean:        62.500 cm/s LVOT VTI:          0.188 m LVOT/AV VTI ratio: 0.60 MITRAL VALVE                TRICUSPID VALVE MV Area (PHT): 3.15 cm     TR Peak grad:   12.4 mmHg MV Decel Time: 241 msec     TR Vmax:        176.00 cm/s MV E  velocity: 107.00 cm/s MV A velocity: 102.00 cm/s  SHUNTS MV E/A ratio:  1.05         Systemic VTI:  0.19 m                             Systemic Diam: 2.10 cm Isaias Cowman MD Electronically signed by Isaias Cowman MD Signature Date/Time: 10/08/2021/1:07:52 PM    Final    DG Chest Port 1 View  Result Date: 10/08/2021 CLINICAL DATA:  Difficulty breathing EXAM: PORTABLE CHEST 1 VIEW COMPARISON:  Previous studies including the examination of 10/07/2021 FINDINGS: Cardiac size is within normal limits. Thoracic aorta is tortuous and ectatic. Tip of endotracheal tube is 4.5 cm above the carina. Enteric tube is noted traversing the esophagus with distal portion of the tube in the stomach. Tip of left IJ central venous catheter is seen in right atrium. There are no signs of pulmonary edema. There is poor inspiration. No new focal pulmonary consolidation is seen. There is improvement in aeration of lower lung fields. There is no significant pleural effusion or pneumothorax. Left shoulder arthroplasty is noted. IMPRESSION: There are no signs of pulmonary edema or focal pulmonary consolidation. There is improvement in aeration of lower lung fields suggesting resolving subsegmental atelectasis. Electronically Signed   By: Elmer Picker M.D.   On: 10/08/2021 08:28   DG Abd 1 View  Result Date: 10/07/2021 CLINICAL DATA:  Nasogastric tube placement. EXAM: ABDOMEN - 1 VIEW COMPARISON:  October 07, 2021 (6:31 p.m.) FINDINGS: A nasogastric tube is seen with its distal tip seen within the body of the stomach. Properly positioned endotracheal tube and left internal jugular venous catheter are also seen. The bowel gas pattern within the upper abdomen is normal. The remainder of the abdomen and pelvis are not included in  the field of view and are limited in evaluation. IMPRESSION: Nasogastric tube positioning, as described above. Electronically Signed   By: Virgina Norfolk M.D.   On: 10/07/2021 21:45   MR  CERVICAL SPINE WO CONTRAST  Result Date: 10/07/2021 CLINICAL DATA:  Unable to move legs. Posterior cervical decompression yesterday for metastatic disease. EXAM: MRI CERVICAL SPINE WITHOUT CONTRAST TECHNIQUE: Multiplanar, multisequence MR imaging of the cervical spine was performed. No intravenous contrast was administered. COMPARISON:  CT cervical spine dated October 05, 2021. MRI cervical spine dated October 03, 2021. FINDINGS: Alignment: New straightening of the normal cervical lordosis. No significant listhesis. Vertebrae: Similar abnormal marrow signal involving the C4 posterior elements and C5 through T2 vertebral bodies and posterior elements. Cord: No abnormal signal. Dorsal epidural tumor at C4 is unchanged. Circumferential epidural tumor from C5-C7 appears similar. No definite superimposed epidural fluid collection. Posterior Fossa, vertebral arteries, paraspinal tissues: Postsurgical changes in the paraspinous soft tissues. No fluid collection. Mild prevertebral soft tissue swelling. Disc levels: C2-C3: Negative disc. Unchanged moderate left and mild right facet arthropathy. No stenosis. C3-C4: Unchanged small posterior disc osteophyte complex, mild bilateral uncovertebral hypertrophy, and moderate left and mild right facet arthropathy. Unchanged mild spinal canal stenosis. Unchanged moderate left neuroforaminal stenosis. No right neuroforaminal stenosis. C4-C5: Unchanged mild disc bulging and moderate right facet uncovertebral hypertrophy. Similar dorsal epidural tumor. New moderate spinal canal stenosis. Unchanged mild bilateral neuroforaminal stenosis. C5-C6: Interval bilateral C6 hemilaminectomies. Unchanged bulky posterior disc osteophyte complex and severe bilateral uncovertebral hypertrophy. Similar circumferential epidural tumor extending into both neural foramina. Progressive severe spinal canal stenosis. Unchanged biforaminal effacement. C6-C7: Unchanged small posterior disc osteophyte complex  and severe bilateral uncovertebral hypertrophy. Similar circumferential epidural tumor extending into both neural foramina. Progressive severe spinal canal stenosis. Unchanged biforaminal effacement. C7-T1: Negative disc. Unchanged partial effacement of the left neural foramen due to tumor. No spinal canal stenosis. IMPRESSION: 1. Interval bilateral C6 hemilaminectomies. Similar degree of epidural tumor from C4-C7 without definite superimposed epidural hematoma or fluid collection. 2. Progressive spinal canal stenosis from C4-C7, severe at C5-C6 and C6-C7. No abnormal spinal cord signal or cord infarct. 3. Similar diffuse cervical osseous metastatic disease. These results were called by telephone at the time of interpretation on 10/07/2021 at 8:05 pm to provider [facility] , who verbally acknowledged these results. Electronically Signed   By: Titus Dubin M.D.   On: 10/07/2021 20:48   DG Abd 1 View  Result Date: 10/07/2021 CLINICAL DATA:  Orogastric tube placement. EXAM: ABDOMEN - 1 VIEW COMPARISON:  None Available. FINDINGS: An enteric tube is seen with its distal tip approximately 3.6 cm distal to the expected region of the gastroesophageal junction. The bowel gas pattern is normal. A large stool burden is seen within the ascending and proximal transverse colon. No radio-opaque calculi or other significant radiographic abnormality are seen. IMPRESSION: 1. Enteric tube positioning, as described above. Further advancement of the NG tube by proximally 7 cm is recommended to decrease the risk of aspiration. 2. Large stool burden without evidence of bowel obstruction. Electronically Signed   By: Virgina Norfolk M.D.   On: 10/07/2021 18:45   DG Abd 1 View  Result Date: 10/07/2021 CLINICAL DATA:  Orogastric tube placement. EXAM: ABDOMEN - 1 VIEW COMPARISON:  October 07, 2021 FINDINGS: An enteric tube is seen with its distal tip approximately 3.4 cm distal to the expected region of the gastroesophageal  junction. This is unchanged in position when compared to the prior study. The bowel gas  pattern is normal. A large stool burden is seen within the ascending and proximal transverse colon. No radio-opaque calculi or other significant radiographic abnormality are seen. IMPRESSION: 1. Enteric tube positioning, as described above. Further advancement of the NG tube by approximately 6 cm is recommended to decrease the risk of aspiration. 2. Large stool burden within the ascending and proximal transverse colon, without evidence of bowel obstruction. Electronically Signed   By: Virgina Norfolk M.D.   On: 10/07/2021 18:43   DG Chest Port 1 View  Result Date: 10/07/2021 CLINICAL DATA:  Hypertension, postoperative day 1 for cervical spine surgery. EXAM: PORTABLE CHEST 1 VIEW COMPARISON:  10/07/2021 4:51 p.m. FINDINGS: An endotracheal tube is present with tip 3.6 cm above the carina. Left central line tip: Lower SVC. A drain is coiled over the right upper chest and lower neck. Reverse left shoulder arthroplasty. Low lung volumes are present, causing crowding of the pulmonary vasculature. Mild bandlike atelectasis in the perihilar regions and bases. No appreciable pneumothorax. There is some vague osseous densities likely reflecting the patient's known osseous metastatic disease. IMPRESSION: 1. Endotracheal tube placed with tip 3.6 cm above the carina. Left central line tip: Lower SVC. No pneumothorax. 2. Low lung volumes are present with scattered perihilar and basilar atelectasis. 3. Hazy osseous densities likely reflecting the patient's known osseous metastatic disease. Electronically Signed   By: Van Clines M.D.   On: 10/07/2021 18:35   DG Chest Port 1 View  Result Date: 10/07/2021 CLINICAL DATA:  Per MD note, sepsis.  [id number] EXAM: PORTABLE CHEST 1 VIEW COMPARISON:  Chest x-ray 03/11/2020, CT chest 10/05/2021 FINDINGS: Surgical drain and skin staples overly the neck. The heart and mediastinal contours are  unchanged. Slightly low lung volumes. Curvilinear densities overlying the right lower lung zone that appear to extend along the anterior ribs likely related to known metastatic osseous disease. No definite focal consolidation. No pulmonary edema. No pleural effusion. No pneumothorax. No acute osseous abnormality. IMPRESSION: 1. Slightly low lung volumes with no acute cardiopulmonary abnormality. 2. Curvilinear densities overlying the right lower lung zone that appear to extend along the anterior ribs likely related to known metastatic osseous disease. Electronically Signed   By: Iven Finn M.D.   On: 10/07/2021 17:09   DG Cervical Spine 2-3 Views  Result Date: 10/06/2021 CLINICAL DATA:  Cervical surgery EXAM: CERVICAL SPINE - 2-3 VIEW COMPARISON:  CT 10/05/2021 FINDINGS: A single C-arm fluoroscopic image was obtained intraoperatively and submitted for post operative interpretation. Lateral image of the cervical spine with tissue spreaders present posteriorly. 2 seconds fluoroscopy time utilized. Radiation dose: 0.9 mGy. Please see the performing provider's procedural report for further detail. IMPRESSION: Intraoperative fluoroscopic image during cervical spine surgery. Electronically Signed   By: Davina Poke D.O.   On: 10/06/2021 09:28   DG C-Arm 1-60 Min-No Report  Result Date: 10/06/2021 Fluoroscopy was utilized by the requesting physician.  No radiographic interpretation.   CT CERVICAL SPINE WO CONTRAST  Result Date: 10/05/2021 CLINICAL DATA:  Metastatic disease of the cervical spine. Unknown primary. EXAM: CT CERVICAL SPINE WITHOUT CONTRAST TECHNIQUE: Multidetector CT imaging of the cervical spine was performed without intravenous contrast. Multiplanar CT image reconstructions were also generated. RADIATION DOSE REDUCTION: This exam was performed according to the departmental dose-optimization program which includes automated exposure control, adjustment of the mA and/or kV according to  patient size and/or use of iterative reconstruction technique. COMPARISON:  MRI of the cervical spine without contrast 10/03/2021 FINDINGS: Alignment: Slight degenerative anterolisthesis again  noted at C3-4. Alignment is otherwise anatomic. Reversal of the normal cervical lordosis is noted. Skull base and vertebrae: Craniocervical junction is within normal limits. Extensive heterogeneous sclerotic lesions are present in the cervical spine. Left-sided sclerotic lesions present at C5 and inferiorly at C4 corresponding 2 marrow signal changes on the MRI scan. Diffuse sclerotic changes present at C6 and C7. Mixed sclerotic and lytic changes are present in the posterior elements at C5 and C6. No pathologic fractures are present. Heterogeneous sclerotic changes are present within the spinous process at T1 and T2. Soft tissues and spinal canal: The extraosseous tumor at C5 and C6 is better appreciated on MRI. Prevertebral soft tissues are within normal limits. Disc levels: Central and foraminal narrowing is greatest at C5-6 and C6-7 is described on the MRI scan. Upper chest: Lung apices are clear. IMPRESSION: 1. Extensive heterogeneous sclerotic lesions throughout the cervical spine compatible with metastatic disease to the bone. 2. Mixed sclerotic and lytic changes in the posterior elements at C5 and C6. 3. Extraosseous tumor at C5 and C6 is better appreciated on the MRI scan. 4. No pathologic fractures. 5. Central and foraminal narrowing is greatest at C5-6 and C6-7 is described on the MRI scan. Electronically Signed   By: San Morelle M.D.   On: 10/05/2021 12:54   CT CHEST ABDOMEN PELVIS W CONTRAST  Result Date: 10/05/2021 CLINICAL DATA:  Metastatic disease evaluation. EXAM: CT CHEST, ABDOMEN, AND PELVIS WITH CONTRAST TECHNIQUE: Multidetector CT imaging of the chest, abdomen and pelvis was performed following the standard protocol during bolus administration of intravenous contrast. RADIATION DOSE REDUCTION:  This exam was performed according to the departmental dose-optimization program which includes automated exposure control, adjustment of the mA and/or kV according to patient size and/or use of iterative reconstruction technique. CONTRAST:  16mL OMNIPAQUE IOHEXOL 300 MG/ML  SOLN COMPARISON:  None Available. FINDINGS: CT CHEST FINDINGS Cardiovascular: No significant vascular findings. Normal heart size. No pericardial effusion. Mediastinum/Nodes: No enlarged mediastinal, hilar, or axillary lymph nodes. Thyroid gland, trachea, and esophagus demonstrate no significant findings. Lungs/Pleura: Mild lingular and mild right middle lobe linear atelectasis is seen. There is no evidence of acute infiltrate, pleural effusion or pneumothorax. Musculoskeletal: A left shoulder replacement is seen with associated streak artifact. Hazy, sclerotic appearing areas are seen involving the anterolateral aspects of the second and fourth right ribs. Multilevel degenerative changes seen throughout the thoracic spine. CT ABDOMEN PELVIS FINDINGS Hepatobiliary: No focal liver abnormality is seen. No gallstones, gallbladder wall thickening, or biliary dilatation. Pancreas: Unremarkable. No pancreatic ductal dilatation or surrounding inflammatory changes. Spleen: Punctate calcified granulomas are seen within the posterior aspect of an otherwise normal-appearing spleen. Adrenals/Urinary Tract: Adrenal glands are unremarkable. Kidneys are normal, without renal calculi, focal lesion, or hydronephrosis. The urinary bladder is limited in evaluation secondary to the presence of overlying streak artifact. Stomach/Bowel: Stomach is within normal limits. The appendix is not identified. Stool is seen throughout the large bowel. No evidence of bowel wall thickening, distention, or inflammatory changes. Noninflamed diverticula are seen throughout the sigmoid colon. Vascular/Lymphatic: Aortic atherosclerosis. Moderate severity Para-aortic and aortocaval  lymphadenopathy is seen. This extends into the pelvis along the bilateral iliac chains, right greater than left Reproductive: The prostate gland is markedly limited in evaluation secondary to the presence of overlying streak artifact. Other: No abdominal wall hernia or abnormality. No abdominopelvic ascites. 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 There is an extensive amount of associated streak artifact with subsequently limited evaluation of the adjacent osseous and  soft tissue structures. Small lytic areas are seen at the levels of L4 and S1, with diffusely sclerotic changes seen throughout the remainder of the sacrum and left iliac bone. A mixed cystic and sclerotic lesion is seen within the right iliac bone. Multilevel degenerative changes are seen throughout the lumbar spine. IMPRESSION: 1. Moderate severity retroperitoneal and pelvic lymphadenopathy, consistent with metastatic disease. 2. Small lytic areas at the levels of L4 and S1, with diffusely sclerotic changes involving the second and fourth right ribs, sacrum and left iliac bone. 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 Further evaluation with a whole body nuclear medicine bone scan is recommended. 3. 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 4. Sigmoid diverticulosis. 5. Aortic atherosclerosis. Aortic Atherosclerosis (ICD10-I70.0). Electronically Signed   By: Virgina Norfolk M.D.   On: 10/05/2021 01:56   MR CERVICAL SPINE WO CONTRAST  Result Date: 10/03/2021 CLINICAL DATA:  Neck and left shoulder pain EXAM: MRI CERVICAL SPINE WITHOUT CONTRAST TECHNIQUE: Multiplanar, multisequence MR imaging of the cervical spine was performed. No intravenous contrast was administered. COMPARISON:  None Available. FINDINGS: Alignment: Reversal of cervical lordosis. Mild anterolisthesis at C3-C4. Vertebrae: Multifocal abnormal marrow signal particularly involving C4-C7 and  included upper thoracic vertebral bodies. This includes the posterior elements. Dorsal epidural soft tissue is present at C4. Circumferential involvement is present at C5-C7. There is marked canal stenosis dorsal to C6 without cord compression. Cord: No abnormal signal. Posterior Fossa, vertebral arteries, paraspinal tissues: Partially imaged paranasal sinus opacification. Disc levels: C2-C3:  Left facet hypertrophy.  No canal or foraminal stenosis. C3-C4: Disc bulge with endplate osteophytes. Left facet hypertrophy. Effacement of the ventral subarachnoid space without significant canal narrowing. No foraminal stenosis. C4-C5: Disc bulge with superimposed small right foraminal protrusion and endplate osteophytes. Left greater than right facet hypertrophy. Dorsal epidural soft tissue. No canal stenosis. No left foraminal stenosis. Probable mild right foraminal stenosis secondary to extraosseous disease. C5-C6: Disc bulge with endplate osteophytes. Facet and uncovertebral hypertrophy. Circumferential epidural soft tissue. Moderate to marked canal stenosis at disc level. Effacement of left greater than right foramina due to extraosseous disease. C6-C7: Disc bulge with endplate osteophytes. Uncovertebral and facet hypertrophy. Moderate canal stenosis at disc level. Effacement of left greater than right foramina due to extraosseous disease. C7-T1: No canal or right foraminal stenosis. Partial effacement left foramen due to extraosseous disease. IMPRESSION: Multifocal abnormal marrow signal with relatively diffuse involvement of C4 into the thoracic spine. Associated epidural disease, greatest at C6 where there is marked canal stenosis without cord compression. Left greater than right foraminal effacement due to extraosseous disease. May reflect metastatic disease or marrow infiltrative neoplastic process. These results will be called to the ordering clinician or representative by the Radiologist Assistant, and  communication documented in the PACS or [employer]. Electronically Signed   By: Macy Mis M.D.   On: 10/03/2021 10:28    Pathology:  SURGICAL PATHOLOGY  CASE: [phone]  PATIENT: Fara Boros  Surgical Pathology Report   Specimen Submitted:  A. Bone, cervical epidural, and tumor   Clinical History: Metastatic epidural spinal cord compression   DIAGNOSIS:  A. BONE AND SOFT TISSUE, CERVICAL SPINE; DECOMPRESSION:  - POSITIVE FOR MALIGNANCY.  - METASTATIC ADENOCARCINOMA, COMPATIBLE WITH PROSTATIC PRIMARY.   Comment:  Immunohistochemical studies show tumor cells to be positive for PSA and  PSAP, with focal dim positivity for CK20.  Tumor cells are negative for  CK7 and ERG.  These findings support the above  diagnosis.   There is sufficient tissue present for ancillary molecular testing.  In  addition, tissue has not undergone decalcification.   IHC slides were prepared by [facility], [place], [place]. All controls stained appropriately.   This test was developed and its performance characteristics determined  by [facility]. It has not been cleared or approved by the [place] Food and Drug  Administration. The FDA does not require this test to go through  premarket FDA review. This test is used for clinical purposes. It should  not be regarded as investigational or for research. This laboratory is  certified under the Clinical Laboratory Improvement Amendments (CLIA) as  qualified to perform high complexity clinical laboratory testing.   GROSS DESCRIPTION:  A. Labeled: Cervical epidural bone and tumor  Received: Fresh  Collection time: 9:15 AM on 10/06/2021  Placed into formalin time: 11:29 AM on 10/06/2021  Tissue fragment(s): Multiple  Size: Aggregate, 8.6 x 2.8 x 1.2 cm  Description: Received are multiple fragments of tan firm bone and  tan-pink soft tissue.  The 2 largest fragments are sectioned.  Entirely submitted in cassettes 1-5.    RB 10/07/2021   Final Diagnosis performed by Allena Napoleon, MD.   Electronically signed  10/09/2021 12:44:52PM   Assessment and Plan:  1.  Metastatic prostate cancer 2.  Neurogenic bladder 3.  Right parietal stroke 4.  Normocytic anemia 5.  Left gastrocnemius vein DVT  -Prior records and medical oncology/radiation oncology consults have been reviewed.  The patient received a dose of Firmagon 240 mg on 10/09/2021.  Due for next dose on around 11/06/2021.  Eligard is the preferred medication on the patient's insurance.  Therefore, will proceed with Eligard on this date. -The patient and his wife had questions regarding his treatment including his injections, radiation, and plans for future treatment.  Reviewed plan to administer Eligard on 03/08/2021.  The patient will meet with Dr. Tammi Klippel from radiation oncology this Thursday to plan for palliative radiation.  Future treatment will be dictated by his performance status and he will follow-up with primary oncologist at [facility] following hospital discharge for further discussion regarding this. -He was encouraged to work with therapy is much as possible. -Hemoglobin is stable at this time.  Monitor periodically.  Would consider PRBC transfusion if hemoglobin less than 7.5.   Thank you for this referral.   Mikey Bussing, DNP, AGPCNP-BC, AOCNP   Addendum I have seen the patient, examined him. I agree with the assessment and and plan and have edited the notes.   I am covering Dr. Tasia Catchings to see this pt while he is here at rehab. Chart reviewed, I agree with Dr. Collie Siad plan of treatment for recently diagnosed metastatic prostate cancer.  He received a loading dose firmagon on 10/09/2021 and is due for Eligard around November 06, 2021.  I will order for him.  He is scheduled to have radiation in our cancer center tomorrow.  He will continue rehab.  We discussed additional treatment including such as abiraterone and low dose prednisone. All questions were  answered. He will f/u with Dr. Tasia Catchings in office after discharge.   Truitt Merle MD 10/23/2021

## 2021-10-22 NOTE — Progress Notes (Signed)
PROGRESS NOTE   Subjective/Complaints:  Pt reports off O2 as of last night.   Getting breathing treatment- is sniffly this AM- says is mouth breather-  Wants to cut down on [name] for more PT and OT- I agree that makes sense as long as does [name] a few times per week- will d/w team.     ROS:  Pt denies SOB, abd pain, CP, N/V/C/D, and vision changes Except for HPI  Objective:   No results found.  Recent Labs    10/21/21 0557  WBC 10.8*  HGB 8.9*  HCT 27.1*  PLT 347   Recent Labs    10/21/21 0557  NA 139  K 3.8  CL 107  CO2 24  GLUCOSE 95  BUN 17  CREATININE 0.51*  CALCIUM 8.2*    Intake/Output Summary (Last 24 hours) at 10/22/2021 1002 Last data filed at 10/22/2021 0900 Gross per 24 hour  Intake 718 ml  Output 4350 ml  Net -3632 ml        Physical Exam: Vital Signs Blood pressure 127/68, pulse 75, temperature 98.1 F (36.7 C), temperature source Oral, resp. rate 18, height '5\' 9"'  (1.753 m), weight 99.9 kg, SpO2 93 %.     General: awake, alert, appropriate, getting breathing treatment by respiratory therapy; wife also came into room; NAD HENT: conjugate gaze; oropharynx a little dry CV: regular rate; no JVD Pulmonary: a little coarse, but otherwise, no W/R/R- adequate air movement GI: soft, NT, ND, (+)BS Psychiatric: appropriate Neurological: Ox3- spasms almost consatn on RLE Genitourinary:    Comments: Medium amber urine in foley Musculoskeletal:     Cervical back: Neck supple. Tenderness present.     Comments: RUE- biceps 5/5; WE 4-/5; Triceps 2+/5; Grip 3-/5; FA 2-/5 LUE- biceps 5-/5; WE 4/5; Triceps 2+/5' Grip 3+/5; FA 2-/5 RLE- 0/5 in RLE LLE- HF 1/5; KE 2-/5; DF 1/5; PF 2/5; EHL 1/5   Skin:    General: Skin is warm and dry.  Neurological:     Mental Status: He is alert.     Comments: Soft voice. Decreased postural control --listing to the right and neck with decreased ROM. Able to  passively get to midline without discomfort.    Decreased to light touch at L1 down to S1 B/L- unable to check S2-S5 today Almost constant spasms of RLE during exam; a few spasms seen on LLE Clonus sustained on RLE; and a few beats on LLE Hoffman's (+) B/L Ue's;  Ox3  Assessment/Plan: 1. Functional deficits which require 3+ hours per day of interdisciplinary therapy in a comprehensive inpatient rehab setting. Physiatrist is providing close team supervision and 24 hour management of active medical problems listed below. Physiatrist and rehab team continue to assess barriers to discharge/monitor patient progress toward functional and medical goals  Care Tool:  Bathing    Body parts bathed by patient: Face   Body parts bathed by helper: Right arm, Left arm, Chest, Abdomen, Front perineal area, Buttocks, Left lower leg, Right lower leg, Left upper leg, Right upper leg     Bathing assist Assist Level: Dependent - Patient 0%     Upper Body Dressing/Undressing Upper body dressing  What is the patient wearing?: Pull over shirt    Upper body assist Assist Level: Maximal Assistance - Patient 25 - 49%    Lower Body Dressing/Undressing Lower body dressing      What is the patient wearing?: Pants     Lower body assist Assist for lower body dressing: Total Assistance - Patient < 25%     Toileting Toileting    Toileting assist Assist for toileting: Dependent - Patient 0%     Transfers Chair/bed transfer  Transfers assist  Chair/bed transfer activity did not occur: Safety/medical concerns        Locomotion Ambulation   Ambulation assist   Ambulation activity did not occur: Safety/medical concerns          Walk 10 feet activity   Assist  Walk 10 feet activity did not occur: Safety/medical concerns        Walk 50 feet activity   Assist Walk 50 feet with 2 turns activity did not occur: Safety/medical concerns         Walk 150 feet  activity   Assist Walk 150 feet activity did not occur: Safety/medical concerns         Walk 10 feet on uneven surface  activity   Assist Walk 10 feet on uneven surfaces activity did not occur: Safety/medical concerns         Wheelchair     Assist Is the patient using a wheelchair?: Yes (Pt will use w/c but was not able to get up to chair today) Type of Wheelchair: Manual Wheelchair activity did not occur: Safety/medical concerns         Wheelchair 50 feet with 2 turns activity    Assist    Wheelchair 50 feet with 2 turns activity did not occur: Safety/medical concerns       Wheelchair 150 feet activity     Assist  Wheelchair 150 feet activity did not occur: Safety/medical concerns       Blood pressure 127/68, pulse 75, temperature 98.1 F (36.7 C), temperature source Oral, resp. rate 18, height '5\' 9"'  (1.753 m), weight 99.9 kg, SpO2 93 %.  Medical Problem List and Plan: 1. 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 Team conference today to determine length of stay 2.  Antithrombotics: -DVT/anticoagulation:  Pharmaceutical: Lovenox.               -antiplatelet therapy: ASA 3. Pain Management: Oxycodone prn. 8/28- pt reports no significant pain- and doesn't want spasticity meds at this time.   4. Mood/Behavior/Sleep: LCSW to follow for evaluation and support.              -antipsychotic agents: N/A 5. Neuropsych/cognition: This patient is capable of making decisions on his own behalf. 6. Skin/Wound Care: Routine pressure relief measures              --monitor incision for healing.  7. Fluids/Electrolytes/Nutrition: Monitor I/O. Check CMET in am.  8. Prostate AdenoCA w/mets:  Loading dose Firmagon 240 mg 10/06/21 per Dr. Tasia Catchings --Charlesetta Garibaldi changed to [facility] due to insurance coverage--next dose around 09/13. --Simulation for XRT 09/31  at 2 pm at [facility] 9. Dysphagia: Continue D3 w/chopped meats and assist at meals. Aspiration precautions.  10. Neurogenic bladder: continue foley for now 11. Neurogenic bowel: Will get KUB to determine stool burden --Colace in am with suppository after supper.  8/25- had good results with bowel program 8/27- good results with bowel program yesterday 8/28- having bowel accidents- and BM with bowel program last 3 nights-  8/29- 2 small BM's last night after bowel program- con't regimen 12. Sepsis/Likely aspiration PNA: Now on Augmentin--antibiotic D#5/10. BC pend-->neg so far.              --Follow up CXR in am.              --pulmonary hygiene for atelectasis/Asymmetric volume loss on right.  13. Abnormal LFTs: Improved overall except for elevation in Aphos--315. Will recheck in am  8/25- Alk phos up to 457; AST 62 and ALT 96- except for alk phos, they are heading downwards- con't to monitor weekly.  14. ABLA: Recheck in am  -stable 8.7 HGB 8/26,  Recheck tomorrow 15. Leucocytosis: Recheck in am. Monitor for fevers/other signs of infection.    8/25- WBC 17.1k- on Augmentin- will recheck labs in AM  -WBC improved to 15.6 10/19/21  -Recheck CBC tomorrow  8/28- Down to 10.8k- con't to monitor 16. Hypotension: Will continue midodrine for now.    8/25- BP better 120s/70s- con't regimen 17. DVT left gastrocnemius vein  -8/26 Noted Lovenox was increased yesterday 8/25 to 161m BID, Distal DVT, will recheck in 1 -2 weeks, compression stockings,   18. Spasticity  8/28- pt doesn't want spasticity meds- isn't painful or annoying.  [place] 8/28- will d/c staples [facility]- need to see about FMills [facility]-supposed to be given 9/12- Oncology to handle it- L/M/via PA about this.     I  spent a total of 42   minutes on total care today- >50% coordination of care- due to d/w wife as well as resp therapy and team conference today    LOS: 5 days A FACE TO FACE EVALUATION WAS PERFORMED  Jason [name] 10/22/2021, 10:03 AM

## 2021-10-22 NOTE — Patient Care Conference (Signed)
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 Courtney Heys Social Worker Present: Loralee Pacas, [place] Nurse Present: Other (comment) Tacy Learn, RN) PT Present: Ailene Rud, PT OT Present: Jamey Ripa, OT [name] Present: Lillie Columbia, [name] PPS Coordinator present : Gunnar Fusi, [name]     Current Status/Progress Goal Weekly Team Focus  Bowel/Bladder   Incont x2, Foley/Bowel program  Verbalizes importance of bowel program and foley care.  teach patient and family foley care and bowel program   Swallow/Nutrition/ Hydration   Supervision A dys 3 textures and thin liquids, reduced to 1-3x a week  Supervision A  regular trial trays and swallow strategies on possible novel diet   ADL's   utilizing hoyer, assist of 2, max-D LB self care, mod-max A simple face washing, otherwise total A self care  mod A pt BADL's and indep for caregiver with DME  adaptive strategies for simple self feeding, UB self care, L UE positioning and B UE simple functional use   Mobility   tot + 2 bed mobility, max-tot sitting balance, maxi move transfers  mod A bed mobility, min A bed to chair transfers  transfers, upright tolerance, sitting balance   Communication             Safety/Cognition/ Behavioral  Observations            Pain   Denies pain  Remains pain free.  assess pain q 4hr and prn   Skin   Skin intact, redness on sacrum, Post neck incision OTA with steristrips, Q2 turn  Skin remains intact. Q2 turn  assess skin q shift and prn     Discharge Planning:  D/c to home with support from wife as primary caregiver.   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 Bowel program. Chronic foley. Home health at discharge. Wife will be only caregiver. Pain controled. Staples removed 08/28. Speech will sign off once diet upgraded to regular. Patient with poor sitting balance. Increasing endurance. Wife assisting with feeding. Built ups and other adaptive equipment at bedside. Patient on target to meet rehab goals: no, may adjust goals at next team meeting.   *See Care Plan and progress notes for long and short-term goals.   Revisions to Treatment Plan:  Increase sitting time, power w/c eval, palliative consult   Teaching Needs: Medications, safety, bowel program, foley care, transfer training, etc.   Current Barriers to Discharge: Inaccessible home environment, Home enviroment access/layout, Neurogenic bowel and bladder, Lack of/limited family support, Weight, and Pending chemo/radiation  Possible Resolutions to Barriers: Family education, medication education, bowel program education, foley care, order recommended DME     Medical Summary Current Status: Prostate CA with mets- quadiplegia- using maximove to transfer- C6- soft call bell- staples out-  Barriers to Discharge:  Decreased family/caregiver support;Home enviroment access/layout;Incontinence;Neurogenic Bowel & Bladder;Medical stability;Pending chemo/radiation;Pending Surgery;Weight bearing restrictions;Wound care;Weight  Barriers to Discharge Comments: got him H/H when goes home- has chronic foley- only has wife to care for him- huge freind support- Possible Resolutions to [employer] Focus: no  sitting balance- max-total A; Total A of 2 transfers- needs power w/c from [place]; grooming mod-max A; feeding- nmod-max with built up handles- LUE not working- needs trough; off O2 as of yesterday- 3x/week [name]- regular trial trays- then will d/c once can do regular diet- no need for cognition- d/c 9/19- Tuesday   Continued Need for Acute Rehabilitation Level of Care: The patient requires daily medical management by a physician with specialized training in physical medicine and rehabilitation for the following reasons: Direction of a multidisciplinary physical rehabilitation program to maximize functional independence : Yes Medical management of patient stability for increased activity during participation in an intensive rehabilitation regime.: Yes Analysis of laboratory values and/or radiology reports with any subsequent need for medication adjustment and/or medical intervention. : Yes   I attest that I was present, lead the team conference, and concur with the assessment and plan of the team.   Ernest Pine 10/22/2021, 3:59 PM

## 2021-10-22 NOTE — Progress Notes (Signed)
Physical Therapy Session Note  Patient Details  Name: Jason [name] MRN: [id number] Date of Birth: Jul 19, 1955  Today's Date: 10/22/2021 PT Individual Time: 0930-1030, 1133-1207 PT Individual Time Calculation (min): 60 min, 34 min   Short Term Goals: Week 1:  PT Short Term Goal 1 (Week 1): pt will perform slideboard transfer with assist PT Short Term Goal 2 (Week 1): Pt with sit EOB with max A or better PT Short Term Goal 3 (Week 1): Pt will tolerate sitting up in w/c between therapy sessions.  Skilled Therapeutic Interventions/Progress Updates:    Session 1: pt received in bed and agreeable to therapy. No complaint of pain. Doffed PRAFOs, donned ted hose and shorts dependently with max A rolling. Tot A supine>sit with cueing for pt to use rotation of shoulder and reaching for bed rail as able. Pt continues to require max-tot for sitting balance. Pt performed slideboard transfer with tot A +2. Pt able to position RUE with cueing to assist with transfers. Pt doffed shirt with mod A and donned with max A provided by his wife. Pt then transported to therapy gym. Initiated sitting balance in TIS w/c, but pt became emotional and requested to return to more private space. Pt able to express concerns about his discharge and caregiving burden. Provided psychosocial support as able. Pt remained seated in TIS after session and was left with all needs in reach and alarm active.   Session 2: Pt seated in w/c on arrival and agreeable to therapy. No complaint of pain. Pt reports feeling comfortable in the chair but is having difficulty coping and states, "when I first found out about the cancer, I asked God to take me." Pt expressed frustration with his condition and concern for his wife being able to take care of him. Therapist provided active listening and psychosocial support. Pt transported to therapy gym for time management and energy conservation. Pt performed slideboard transfer with tot A x 2 <> mat  table. Assist for leaning and lifting LE so therapist can place board. Pt then participated in sitting balance activities with max-tot A and back support from physioball. Attempted crunches and lateral leans to extended UE with assist for improved transfers. Pt returned to chair and returned to room, was left with all needs in reach and alarm active.   Therapy Documentation Precautions:  Precautions Precautions: Fall, Cervical Precaution Comments: C6 laminectomy, MAP >65 Restrictions Weight Bearing Restrictions: No General:       Therapy/Group: Individual Therapy  [name] 10/22/2021, 12:22 PM

## 2021-10-22 NOTE — Progress Notes (Signed)
Pt weaned to room air. No complications noted. Pt denies SOB. O2 sats 98% on room air. Sheela Stack, LPN

## 2021-10-22 NOTE — Progress Notes (Signed)
Occupational Therapy Session Note  Patient Details  Name: Jason [name] MRN: [id number] Date of Birth: 11/01/1955  Today's Date: 10/22/2021 OT Individual Time: 1445-1530 OT Individual Time Calculation (min): 45 min    Short Term Goals: Week 1:  OT Short Term Goal 1 (Week 1): Pt will perform simple self feeding with mod A with AE and compensatory set up OT Short Term Goal 2 (Week 1): Pt will complete UB bathing supported sitting at sink wiht mod A OT Short Term Goal 3 (Week 1): Pt will complete pull over shirt donning with adapted strategies with mod A OT Short Term Goal 4 (Week 1): Pt will perform sitting EOB with mod A for UE simple functional or reaching task  Skilled Therapeutic Interventions/Progress Updates:    Patient agreeable to participate in OT session. Reports 0/10 pain level. Wife, Shawn Route present during session.  Patient participated in skilled OT session focusing on LUE NM re-education while utilizing kinesiotape in order provide shoulder joint stability, help decrease severity of shoulder subluxation, and decreased pain level experienced intermittently during self care tasks . Therapist educated patient and wife regarding use of kinsiotape, precautions, benefits, and removal instructions with handout provided and left in patient's room. Pt and wife verbalized understanding. Tape application completed with patient in bed.    Therapy Documentation Precautions:  Precautions Precautions: Fall, Cervical Precaution Comments: C6 laminectomy, MAP >65 Restrictions Weight Bearing Restrictions: No   Therapy/Group: Individual Therapy  Ailene Ravel, OTR/L,CBIS  Supplemental OT - [facility] and [facility]  10/22/2021, 1:15 PM

## 2021-10-22 NOTE — Progress Notes (Signed)
Physical Therapy Session Note  Patient Details  Name: Jason [name] MRN: [id number] Date of Birth: 05/28/1955  Today's Date: 10/22/2021 PT Individual Time: 0973-5329 PT Individual Time Calculation (min): 56 min   Short Term Goals: Week 1:  PT Short Term Goal 1 (Week 1): pt will perform slideboard transfer with assist PT Short Term Goal 2 (Week 1): Pt with sit EOB with max A or better PT Short Term Goal 3 (Week 1): Pt will tolerate sitting up in w/c between therapy sessions.  Skilled Therapeutic Interventions/Progress Updates:    Pt received sitting in TIS w/c with his wife present and pt agreeable to therapy session.  Transported to/from gym in w/c for time management and energy conservation.   Of note, pt emotionally labile throughout session demonstrating difficulty processing his current medical status - pt confesses that he has been having "dreams" where he is talking to people who have already passed away (such as his dad) and talking to God - therapist provides emotional support throughout session and pt will quickly change topics to talk about a positive subject such as his love of music.  L lateral scoot transfer TIS w/c>EOM using transfer board - requires +2 assist to safely guard his trunk while therapist provides total assist for trunk control and lifting L LE while providing dependent assist to place transfer board - performed the scoot with total assist of 1 for trunk control/balance as well as scooting with +2 assist providing min assist to guard his trunk.  Pt tolerated sitting EOM ~80mnutes during session while participating in the following trunk control/sitting balance/core strengthening activities:  - B UE pulling forward via B HHA from a slightly reclined position to achieve his "balancing point" then transitioned bilateral hands to his knees to prevent anterior LOB - focused on finding his balance while trying to achieve upright posture with cuing for looking upright as  pt tends to bend neck forward looking down - sustaining static sitting without support but using B UEs, able to maintain balance with close supervision for 5-13 seconds  Pt requires frequent supported seated rest breaks due to quick fatigue - during rest breaks focused on stretching anterior chest muscles due to pt having very rounded shoulders (consideration taken to account for pt's L shoulder subluxation).   While sitting EOM performed L LE long arc quads achieving ~10-20 degrees of active knee extension against gravity ~3 reps prior to nervous system fatigue x3 sets during session. Attempted with R LE, but only trace muscle activation noted.   Discussed possibility of utilizing tilt table in future sessions.  R lateral scoot transfer EOM>TIS w/c again with +2 assist to maintain trunk control EOM while therapist provided total assist for lifting R LE to allow dependent assist to position transfer board - performed R lateral scooting with total assist of 1 for trunk control (to prevent anterior LOB) and for scooting with +2 providing min assist for trunk control and for safety - cuing for hand positioning to promote increased independence - pt does well throughout session of performing proper head/hips relationship.  Transported back to room and pt requesting to return to bed. L squat pivot as just described. Sit>supine with +2 total assist for trunk descent and B LE management into the bed. At end of session, pt left supine in bed with needs in reach, bed alarm on, and his wife present.  Therapy Documentation Precautions:  Precautions Precautions: Fall, Cervical Precaution Comments: C6 laminectomy, MAP >65 Restrictions Weight Bearing Restrictions: No  Pain:  No reports of pain throughout session.    Therapy/Group: Individual Therapy  Tawana Scale , PT, DPT, NCS, CSRS 10/22/2021, 12:58 PM

## 2021-10-22 NOTE — Progress Notes (Signed)
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 Stack CT Angio Head Neck with/without CM CLINICAL DATA:  Left hand paresthesia. Metastatic prostate cancer.  FINDINGS: CT HEAD FINDINGS   Brain: There is no mass, hemorrhage or extra-axial collection. The size and configuration of the ventricles and extra-axial CSF spaces are normal. There is no acute or chronic infarction. The brain parenchyma is normal.   Skull: The visualized skull base, calvarium and extracranial soft tissues are normal.   Sinuses/Orbits: No fluid levels or advanced mucosal thickening of the visualized paranasal sinuses. No mastoid or middle ear effusion.  The orbits are normal.   CTA NECK FINDINGS   SKELETON: Numerous sclerotic lesions throughout the cervical spine.   OTHER NECK: Normal pharynx, larynx and major salivary glands. No cervical lymphadenopathy. Unremarkable thyroid gland.   UPPER CHEST: Right nodal lobe atelectasis and hazy opacities in the right upper lobe.   AORTIC ARCH:   There is calcific atherosclerosis of the aortic arch. There is no aneurysm, dissection or hemodynamically significant stenosis of the visualized portion of the aorta. Conventional 3 vessel aortic branching pattern. The visualized proximal subclavian arteries are widely patent.   RIGHT CAROTID SYSTEM: Normal without aneurysm, dissection or stenosis.   LEFT CAROTID SYSTEM: Normal without aneurysm, dissection or stenosis.   VERTEBRAL ARTERIES: Right dominant configuration. Both origins are clearly patent. There is no dissection, occlusion or flow-limiting stenosis to the skull base (V1-V3 segments).   CTA HEAD FINDINGS   POSTERIOR CIRCULATION:   --Vertebral arteries: Normal V4 segments.   --Inferior cerebellar arteries: Normal.   --Basilar artery: Normal.   --Superior  cerebellar arteries: Normal.   --Posterior cerebral arteries (PCA): Normal.   ANTERIOR CIRCULATION:   --Intracranial internal carotid arteries: Normal.   --Anterior cerebral arteries (ACA): Normal. Both A1 segments are present. Patent anterior communicating artery (a-comm).   --Middle cerebral arteries (MCA): Normal.   VENOUS SINUSES: As permitted by contrast timing, patent.   ANATOMIC VARIANTS: None   Review of the MIP images confirms the above findings.   Review of the MIP images confirms the above findings   IMPRESSION: 1. No emergent large vessel occlusion, hemodynamically significant stenosis, aneurysm or dissection of the major cervical or intracranial arteries. 2. Numerous sclerotic lesions throughout the cervical spine, consistent with osseous metastatic disease. 3. Right middle lobe atelectasis and hazy opacities in the right upper lobe, possibly infection or asymmetric pulmonary edema. 4. Aortic Atherosclerosis (ICD10-I70.0).    10/08/2021 Dr. Carico MR Brain with/without Contrast CLINICAL DATA:  Extensive metastatic disease with confusion and lethargy  FINDINGS: Brain: There is a small focus of diffusion restriction in the right parietal cortex with faint corresponding FLAIR signal abnormality but no abnormal enhancement most in keeping with a small acute infarct. There is no associated hemorrhage or mass effect.   There is no other evidence of acute infarct. There is no acute intracranial hemorrhage or extra-axial fluid collection.   Background parenchymal volume is normal. The ventricles are normal in size. Gray-white differentiation is preserved. Scattered small foci of FLAIR signal abnormality in the subcortical and periventricular white matter are nonspecific but likely reflects sequela of mild chronic white matter microangiopathy.  There is no abnormal parenchymal enhancement there is no solid parenchymal lesion.   There is calvarial signal abnormality  bilaterally likely reflecting osseous metastatic disease. There is intracranial enhancement along the right frontal convexity likely   reflecting extraosseous tumor measuring up to 5 mm in thickness. There is additional thin pachymeningeal thickening and enhancement over the right cerebral convexity suspicious for additional tumor extension. There is extracranial extension of tumor into the soft tissues of the overlying scalp measuring up to 3 mm in thickness. Additional extracranial tumor is seen along the scalp at the vertex measuring up to 7 mm in thickness.   There is no mass effect on the underlying brain parenchyma due to the intracranial tumor. There is no midline shift.   Vascular: Normal flow voids.   Skull and upper cervical spine: Osseous metastatic disease is seen in the calvarium with extraosseous extension as described above.   Sinuses/Orbits: There is extensive fluid in the paranasal sinuses likely related to instrumentation. The globes and orbits are unremarkable.   Other: None.   IMPRESSION: 1. 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 There is intracranial extension of tumor along the right frontal convexity measuring up to 5 mm in thickness without mass effect on the underlying brain parenchyma or midline shift. Extracranial extension of tumor in this location measures up to approximately 3 mm. 2. Additional extracranial extension of tumor into the scalp along the vertex measuring up to 7 mm in thickness. 3. Small focus of diffusion restriction in the right parietal cortex most in keeping with a small acute infarct. 4. No evidence of parenchymal metastatic disease.   10/08/2021 Dr. Meda Coffee MR Lumbar Spine with/without Contrast CLINICAL DATA:  Presenting with neck and shoulder pain found to have widely metastatic cancer suspicious for prostate cancer status post emergent C6 cervical laminectomy with resection of epidural spinal  tumor. Postop day 1 with worsening shock, confusion, lethargy.   FINDINGS: MRI THORACIC SPINE FINDINGS   A single sagittal T2 sequence through the C-spine was obtained.  Postsurgical changes reflecting C6 laminectomy are noted. The spinal canal patency is unchanged compared to the postoperative study from 1 day prior. There is no definite cord signal abnormality. There is prevertebral edema which may be postsurgical in nature.   Alignment: There is exaggerated thoracic kyphosis. There is no antero or retrolisthesis.   Vertebrae: There is extensive signal abnormality throughout the thoracic spine consistent with extensive osseous metastatic disease.  There is no evidence of pathologic fracture.   There is breakthrough of the posterior cortex with epidural tumor along the posterior endplates at T9, W26, and T12 without no significant spinal canal stenosis or evidence of cord compression.   Circumferential enhancement is also seen in the spinal canal at the cervicothoracic junction extending superiorly off the field of view and inferiorly to the T3-T4 level likely reflecting epidural tumor also without high-grade spinal canal stenosis or cord compression (for example 27-2, 27-6).   Cord: Normal in signal and morphology. There is no abnormal cord enhancement.   There is scallop shaped T1 isointense material in the dorsal canal along the cord beginning at the T9-T10 level extending inferiorly into the lumbar spine which anteriorly displaces the cord and cauda equina nerve roots, likely subdural in location of uncertain etiology. The collection measures up to 7 mm in maximal thickness in the axial plane (see image 24-33, 20-9). There is no peripheral enhancement to suggest abscess.   Paraspinal and other soft tissues: There is consolidation in the lower lobes, right worse than left, new since the prior CT from 08/12. The paraspinal soft tissues are unremarkable.   Disc levels:   There is overall  mild background degenerative  change throughout the thoracic spine. There is no significant disc herniation or spinal canal or neural foraminal stenosis.   MRI LUMBAR SPINE FINDINGS   Segmentation: Standard; the lowest formed disc space is designated L5-S1.   Alignment:  There is no significant antero or retrolisthesis.   Vertebrae: There is extensive signal abnormality throughout the lumbar spine and imaged pelvis consistent with extensive osseous metastatic disease, most notably at L1 and L2. There is epidural extension of tumor most notably at L1 where it measures up to 5 mm in thickness and at L2 particularly in the region of the right subarticular zone and along the right lamina (7-14); however, circumferential enhancement extends inferiorly throughout the lumbar spine into the sacrum likely reflecting additional epidural tumor.   The epidural tumor at L1 results in mild canal narrowing without cord/conus compression.   The epidural tumor lower in the lumbar spine superimposed on pre-existing degenerative changes contribute to multilevel spinal canal stenosis as described in detail below.   There is no evidence of pathologic fracture.   Conus medullaris: Extends to the T12-L1 level. There is crowding of the cauda equina nerve roots due to the above-described presumed subdural fluid collection. There is no abnormal enhancement of the cauda equina nerve roots.  Paraspinal and other soft tissues: The paraspinal soft tissues are unremarkable.   Disc levels:   T12-L1: No significant spinal canal or neural foraminal stenosis   L1-L2: Disc degeneration without significant spinal canal or neural foraminal stenosis   L2-L3: Disc degeneration and bilateral facet arthropathy result in mild bilateral subarticular zone narrowing without significant neural foraminal stenosis.   L3-L4: There is diffuse disc bulge and moderate bilateral facet arthropathy with ligamentum flavum thickening as well as  probable epidural tumor resulting in severe spinal canal stenosis with cauda equina nerve root compression and moderate left and mild right neural foraminal stenosis   L4-L5: There is diffuse disc bulge and moderate bilateral facet arthropathy as well as probable epidural tumor most notably along the dorsal canal resulting in severe spinal canal stenosis with cauda equina nerve root compression and moderate left and mild right neural foraminal stenosis.   L5-S1: There is a diffuse disc bulge and bilateral facet arthropathy as well as epidural tumor along the dorsal and ventral canal resulting in moderate spinal canal stenosis with bilateral subarticular zone narrowing and moderate left worse than right neural foraminal stenosis.   IMPRESSION: 1. 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 2. 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 3. Additional epidural tumor along the posterior endplates at T9, F16, and T12 without high-grade spinal canal stenosis or cord compression. 4. Extensive epidural tumor in the lumbar spine is greatest in thickness at L1 without high-grade spinal canal stenosis at this level; however, superimposed on pre-existing degenerative changes lower in the lumbar spine results in severe spinal canal stenosis with cauda equina nerve root compression at L3-L4 and L4-L5.  Multilevel neural foraminal stenosis is detailed above. 5. Postsurgical changes reflecting C6 laminectomy unchanged spinal canal stenosis compared to the postoperative study from 1 day prior.  No definite cord signal abnormality. 6. Extramedullary fluid collection along the dorsal aspect of the cord beginning at T9-T10 extending into the lumbar spine is likely subdural in location but is of uncertain etiology; evolving  blood products not excluded. The collection anteriorly displaces the cord and  cauda equina nerve roots without frank cord compression or signal abnormality. There is no peripheral enhancement to suggest abscess. 7. Prevertebral edema in the cervical spine which may be postsurgical in nature. 8. New bilateral lower lobe consolidations may reflect atelectasis or developing pneumonia/aspiration.   Past/Anticipated chemotherapy by medical oncology, if any: NA  Pain on a scale of 0-10 is:     If Spine Met(s), symptoms, if any, include: Bowel/Bladder retention or incontinence (please describe): No has foley catheter, on bowel regimen Numbness or weakness in extremities (please describe): Yes with muscle twitching Current Decadron regimen, if applicable: No  Ambulatory status? Walker? Wheelchair?:   SAFETY ISSUES: Prior radiation? No Pacemaker/ICD? No Possible current pregnancy? Male  Is the patient on methotrexate? No  Current Complaints / other details:     

## 2021-10-22 NOTE — Progress Notes (Incomplete)
Patient ID: Jason [name], male   DOB: 1956-02-12, 66 y.o.   MRN: [id number]  SW met with pt and pt wife in room during [name] trial tray session. Pt in good spirits. SW provided updates from team conference, and d/c date 9/19. SW informed on HHA in place. SW reminded about appt tomorrow and transportation in place. Pt wife will meet him at this appointment. Discussion on possibly changing insurance due to him likely retiring soon, and [employer] will be primary. SW discussed if LTC disability was an option through employer. States he did not sign up for this. SW discussed applying for SSDI and or considering applying for social security. It has not been decided at this point. No further questions/concerns at this time.   Cecile Sheerer, MSW, LCSWA Office: [phone] Cell: [phone] Fax: [phone]

## 2021-10-23 MED ORDER — LEUPROLIDE ACETATE (6 MONTH) 45 MG ~~LOC~~ KIT: 22.5000 mg | PACK | Freq: Once | SUBCUTANEOUS | Status: DC

## 2021-10-23 NOTE — Progress Notes (Signed)
Physical Therapy Session Note  Patient Details  Name: Jason [name] MRN: [id number] Date of Birth: [birth year]  Today's Date: 10/23/2021 PT Individual Time: 0835-0955 PT Individual Time Calculation (min): 80 min   Short Term Goals: Week 1:  PT Short Term Goal 1 (Week 1): pt will perform slideboard transfer with assist PT Short Term Goal 2 (Week 1): Pt with sit EOB with max A or better PT Short Term Goal 3 (Week 1): Pt will tolerate sitting up in w/c between therapy sessions.  Skilled Therapeutic Interventions/Progress Updates: Pt presented bed with with wife present agreeable to therapy. RN arriving shortly after PTA"s arrival to administer am meds. Pt performed rolling maxA x 1 L/R to check brief (clean). PTA then donned TED hose and threaded pants total A and had pt perform rolling again in same manner to pull shorts over hips. HOB then raised and pt received meds. Upon completion pt performed supine to sit maxA x 1 with wife behind for safety/security. Pt required maxA for sitting balance with PTA assisting in correctly positioning pt to achieve midline. PTA then doffed shirt total A and donned new shirt maxA as well as applied deodorant total A. PTA then set up slide board total A with PTA providing education on correct placement of Slide board, head hips relationship as well as advising will attempt to locate shorter board (current one is >31in). Pt then performed Slide boardr transfer to R with maxA x2 and PTA repositioned pt maxA x1 in TIS. RT arrived in room to provide treatment for ~5 min. Afterwards PTA transported pt to ortho gym and participated in BITS visual scanning 2 x 10 with pt using drumstick. PTA assisted pt with maxA for trunk activation to achieve erect posture and providing HOH assist with R hand to maintain grip on drumstick. Pt was able to perform reaching with minimal challenges and PTA assisting in lateral leans for more moderate challenges (pt did not perform far L  challenges). Pt encouraged to assist PTA by using abdominal ms when transitioning to upright and PTA attempting to decrease assist when performing reaching task for core activation. Pt transported back to room at end of session and remained in w/c with belt alarm on, soft touch bell within reach and wife present.      Therapy Documentation Precautions:  Precautions Precautions: Fall, Cervical Precaution Comments: C6 laminectomy, MAP >65 Restrictions Weight Bearing Restrictions: Yes General:   Vital Signs:   Pain:   Mobility:   Locomotion :    Trunk/Postural Assessment :    Balance:   Exercises:   Other Treatments:      Therapy/Group: Individual Therapy  Chrysa Rampy 10/23/2021, 1:39 PM

## 2021-10-23 NOTE — Progress Notes (Signed)
PROGRESS NOTE   Subjective/Complaints:  Pt tolerating off O2-  Asked PT to get pt w/c evaluation by [facility].   Hasn't eaten yet-  K tape for L shoulder due to subluxation.     ROS:  Pt denies SOB, abd pain, CP, N/V/C/D, and vision changes  Except for HPI  Objective:   No results found.  Recent Labs    10/21/21 0557  WBC 10.8*  HGB 8.9*  HCT 27.1*  PLT 347   Recent Labs    10/21/21 0557  NA 139  K 3.8  CL 107  CO2 24  GLUCOSE 95  BUN 17  CREATININE 0.51*  CALCIUM 8.2*    Intake/Output Summary (Last 24 hours) at 10/23/2021 0853 Last data filed at 10/23/2021 0520 Gross per 24 hour  Intake 476 ml  Output 1550 ml  Net -1074 ml        Physical Exam: Vital Signs Blood pressure 119/64, pulse 71, temperature 98.2 F (36.8 C), temperature source Oral, resp. rate 17, height '5\' 9"'  (1.753 m), weight 99.9 kg, SpO2 95 %.      General: awake, alert, appropriate, supine in bed; air mattress; NAD HENT: conjugate gaze; oropharynx moist CV: regular rate; no JVD Pulmonary: CTA B/L; no W/R/R- good air movement GI: soft, NT, ND, (+)BS Psychiatric: appropriate Neurological: Ox3 Genitourinary:    Comments: Medium amber urine in foley Musculoskeletal: L shoulder K tape in place- still subluxed    Cervical back: Neck supple. Tenderness present.     Comments: RUE- biceps 5/5; WE 4-/5; Triceps 2+/5; Grip 3-/5; FA 2-/5 LUE- biceps 5-/5; WE 4/5; Triceps 2+/5' Grip 3+/5; FA 2-/5 RLE- 0/5 in RLE LLE- HF 1/5; KE 2-/5; DF 1/5; PF 2/5; EHL 1/5   Skin:    General: Skin is warm and dry.  Neurological:     Mental Status: He is alert.     Comments: Soft voice. Decreased postural control --listing to the right and neck with decreased ROM. Able to passively get to midline without discomfort.    Decreased to light touch at L1 down to S1 B/L- unable to check S2-S5 today Almost constant spasms of RLE during exam; a few  spasms seen on LLE Clonus sustained on RLE; and a few beats on LLE Hoffman's (+) B/L Ue's;  Ox3  Assessment/Plan: 1. Functional deficits which require 3+ hours per day of interdisciplinary therapy in a comprehensive inpatient rehab setting. Physiatrist is providing close team supervision and 24 hour management of active medical problems listed below. Physiatrist and rehab team continue to assess barriers to discharge/monitor patient progress toward functional and medical goals  Care Tool:  Bathing    Body parts bathed by patient: Face   Body parts bathed by helper: Right arm, Left arm, Chest, Abdomen, Front perineal area, Buttocks, Left lower leg, Right lower leg, Left upper leg, Right upper leg     Bathing assist Assist Level: Dependent - Patient 0%     Upper Body Dressing/Undressing Upper body dressing   What is the patient wearing?: Pull over shirt    Upper body assist Assist Level: Maximal Assistance - Patient 25 - 49%    Lower Body Dressing/Undressing Lower  body dressing      What is the patient wearing?: Pants     Lower body assist Assist for lower body dressing: Total Assistance - Patient < 25%     Toileting Toileting    Toileting assist Assist for toileting: Dependent - Patient 0%     Transfers Chair/bed transfer  Transfers assist  Chair/bed transfer activity did not occur: Safety/medical concerns  Chair/bed transfer assist level: 2 Helpers (slideboard)     Locomotion Ambulation   Ambulation assist   Ambulation activity did not occur: Safety/medical concerns          Walk 10 feet activity   Assist  Walk 10 feet activity did not occur: Safety/medical concerns        Walk 50 feet activity   Assist Walk 50 feet with 2 turns activity did not occur: Safety/medical concerns         Walk 150 feet activity   Assist Walk 150 feet activity did not occur: Safety/medical concerns         Walk 10 feet on uneven surface   activity   Assist Walk 10 feet on uneven surfaces activity did not occur: Safety/medical concerns         Wheelchair     Assist Is the patient using a wheelchair?: Yes (Pt will use w/c but was not able to get up to chair today) Type of Wheelchair: Manual Wheelchair activity did not occur: Safety/medical concerns         Wheelchair 50 feet with 2 turns activity    Assist    Wheelchair 50 feet with 2 turns activity did not occur: Safety/medical concerns       Wheelchair 150 feet activity     Assist  Wheelchair 150 feet activity did not occur: Safety/medical concerns       Blood pressure 119/64, pulse 71, temperature 98.2 F (36.8 C), temperature source Oral, resp. rate 17, height '5\' 9"'  (1.753 m), weight 99.9 kg, SpO2 95 %.  Medical Problem List and Plan: 1. 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  Antithrombotics: -DVT/anticoagulation:  Pharmaceutical: Lovenox.               -antiplatelet therapy: ASA 3. Pain Management: Oxycodone prn. 8/28- pt reports no significant pain- and doesn't want spasticity meds at this time.   4. Mood/Behavior/Sleep: LCSW to follow for evaluation and support.              -antipsychotic agents: N/A 5. Neuropsych/cognition: This patient is capable of making decisions on his own behalf. 6. Skin/Wound Care: Routine pressure relief measures              --monitor incision for healing.  7. Fluids/Electrolytes/Nutrition: Monitor I/O. Check CMET in am.  8. Prostate AdenoCA w/mets: Loading dose Firmagon 240 mg 10/06/21 per Dr. Tasia Catchings --Charlesetta Garibaldi changed to [facility] due to insurance coverage--next dose around 09/13. --Simulation for XRT 08/31 at 2 pm at [facility] 8/30- starts XRT  tomorrow 9. Dysphagia: Continue D3 w/chopped meats and assist at meals. Aspiration precautions.  10. Neurogenic bladder: continue foley for now 11. Neurogenic bowel: Will get KUB to determine  stool burden --Colace in am with suppository after supper.  8/25- had good results with bowel program 8/27- good results with bowel program yesterday 8/28- having bowel accidents- and BM with bowel program last 3 nights-  8/29- 2 small BM's last night after bowel program- con't regimen 8/30- bowel program going OK per pt 12. Sepsis/Likely aspiration PNA: Now on Augmentin--antibiotic D#5/10. BC pend-->neg so far.              --Follow up CXR in am.              --pulmonary hygiene for atelectasis/Asymmetric volume loss on right.  13. Abnormal LFTs: Improved overall except for elevation in Aphos--315. Will recheck in am  8/25- Alk phos up to 457; AST 62 and ALT 96- except for alk phos, they are heading downwards- con't to monitor weekly.  14. ABLA: Recheck in am  -stable 8.7 HGB 8/26,  Recheck tomorrow 15. Leucocytosis: Recheck in am. Monitor for fevers/other signs of infection.    8/25- WBC 17.1k- on Augmentin- will recheck labs in AM  -WBC improved to 15.6 10/19/21  -Recheck CBC tomorrow  8/28- Down to 10.8k- con't to monitor 16. Hypotension: Will continue midodrine for now.    8/25- BP better 120s/70s- con't regimen 17. DVT left gastrocnemius vein  -8/26 Noted Lovenox was increased yesterday 8/25 to 112m BID, Distal DVT, will recheck in 1 -2 weeks, compression stockings,   18. Spasticity  8/28- pt doesn't want spasticity meds- isn't painful or annoying.  1[place] 8/28- will d/c staples and [facility] 8[facility]- need to see about FMills Koller-supposed to be given 9/12- Oncology to handle it- L/M/via PA about this.  8/30- per pt, they aren't using Firmagen- are using other product- will d/w Oncology more   Will have pt seen by W/C rep- and justify  - will likely need power w/c with ROHO cushion- will add details as we know them    LOS: 6 days A FACE TO FACE EVALUATION WAS PERFORMED  Mayci Haning 10/23/2021, 8:53 AM

## 2021-10-23 NOTE — Progress Notes (Signed)
Speech Language Pathology Daily Session Note  Patient Details  Name: Jason [name] MRN: [id number] Date of Birth: [birth year]  Today's Date: 10/23/2021 [name] Individual Time: 1115-1215 [name] Individual Time Calculation (min): 60 min  Short Term Goals: Week 1: [name] Short Term Goal 1 (Week 1): Pt will tolerate Dys 3/thin diet with no overt s/s aspiration or dysphagia given Supervision A verbal cues for safe swallow strategies [name] Short Term Goal 2 (Week 1): Pt will trial regular solids with no overt s/s aspiration or dysphagia given Supervision A verbal cues to determine potential for diet upgrade  Skilled Therapeutic Interventions:Skilled ST services focused on swallow skills.  [name] facilitated PO intake of regular texture lunch tray trial. Pt positioned to 90 degrees in tilt chair. Pt required mod A self-feeding and demonstrated mod I for swallow strategies. Pt carefully masticated regular textures and used liquid wash when needed. Pt demonstrated no overt s/s aspiration, as well as supports no additional fatigue with advanced texture. [name] upgraded diet to regular textures and full supervision A to assist with self-feeding. [name] recommends x1 f/u ST for tolerance prior to d/c. All questions answered to satisfaction from pt and pt's wife. Pt was left in room with wife. Recommend to continue ST services.  Pain Pain Assessment Pain Score: 0-No pain  Therapy/Group: Individual Therapy  Ayomide Purdy  [facility] 10/23/2021, 2:24 PM

## 2021-10-23 NOTE — Progress Notes (Signed)
Occupational Therapy Session Note  Patient Details  Name: Jason [name] MRN: [id number] Date of Birth: 1955/03/07  Today's Date: 10/23/2021 OT Individual Time: 1345-1445 OT Individual Time Calculation (min): 60 min    Short Term Goals: Week 1:  OT Short Term Goal 1 (Week 1): Pt will perform simple self feeding with mod A with AE and compensatory set up OT Short Term Goal 2 (Week 1): Pt will complete UB bathing supported sitting at sink wiht mod A OT Short Term Goal 3 (Week 1): Pt will complete pull over shirt donning with adapted strategies with mod A OT Short Term Goal 4 (Week 1): Pt will perform sitting EOB with mod A for UE simple functional or reaching task  Skilled Therapeutic Interventions/Progress Updates:  Pt seen for skilled OT sesison with wife present at start and completion of visit for intermittent training on positioning, pressure reliefs and quad assisted cough techniques as well as observing end of session for safe and effective SB transfer beck to bed from TIS. Pt seated in TIS upon OT arrival for session. Transported to therapy gym and pt performed 10 table slides on towel for R scap and sh ROM with L UE weightbearing into table surface. Applied SaeboStim One with protocol and response below for 20 min to R deltoid. Pt able to perform reaching up to 110 degrees R sh flexion which is a significant improvement from IE. Grasped, placed and removed suction manipulatives on inclined surface with 70% accuracy and min A due to low force at times. Once returned to room, Anderson [place] OT assisted as skilled 2nd hand therapist for 2 person TIS to bed with SB with 2 helper and pt attempts to grasp and place board then EOB sitting for balance and righting reactions with max A. Returned to supine and positioned UE/LE's in optimal alinement and left pt with wife with bed exit engaged, call button and needs in reach.   no adverse skin reactions to R deltiod mm 330 pulse width 35 Hz pulse  rate On 8 sec/ off 8 sec Ramp up/ down 2 sec Symmetrical Biphasic wave form  Max intensity 153m at 500 Ohm load  Therapy Documentation Precautions:  Precautions Precautions: Fall, Cervical Precaution Comments: C6 laminectomy, MAP >65 Restrictions Weight Bearing Restrictions: No    Therapy/Group: Individual Therapy  EBarnabas Lister8/30/2023, 7:51 AM

## 2021-10-24 ENCOUNTER — Ambulatory Visit
Admit: 2021-10-24 | Discharge: 2021-10-24 | Disposition: A | Discharge status: Home / Self Care | Payer: BC Managed Care – PPO | Attending: Radiation Oncology | Admitting: Radiation Oncology

## 2021-10-24 ENCOUNTER — Ambulatory Visit: Payer: BC Managed Care – PPO

## 2021-10-24 ENCOUNTER — Ambulatory Visit
Admission: RE | Admit: 2021-10-24 | Discharge: 2021-10-24 | Disposition: A | Discharge status: Home / Self Care | Payer: BC Managed Care – PPO | Source: Ambulatory Visit | Attending: Radiation Oncology | Admitting: Radiation Oncology

## 2021-10-24 DIAGNOSIS — C7951 Secondary malignant neoplasm of bone: Secondary | ICD-10-CM | POA: Diagnosis not present

## 2021-10-24 DIAGNOSIS — C61 Malignant neoplasm of prostate: Secondary | ICD-10-CM | POA: Diagnosis not present

## 2021-10-24 DIAGNOSIS — C490 Malignant neoplasm of connective and soft tissue of head, face and neck: Secondary | ICD-10-CM | POA: Diagnosis not present

## 2021-10-24 LAB — CBC
HCT: 27 % — ABNORMAL LOW (ref 39.0–52.0)
Hemoglobin: 9.1 g/dL — ABNORMAL LOW (ref 13.0–17.0)
MCH: 32.4 pg (ref 26.0–34.0)
MCHC: 33.7 g/dL (ref 30.0–36.0)
MCV: 96.1 fL (ref 80.0–100.0)
Platelets: 325 10*3/uL (ref 150–400)
RBC: 2.81 MIL/uL — ABNORMAL LOW (ref 4.22–5.81)
RDW: 16.5 % — ABNORMAL HIGH (ref 11.5–15.5)
WBC: 9.5 10*3/uL (ref 4.0–10.5)
nRBC: 0 % (ref 0.0–0.2)

## 2021-10-24 MED ORDER — CLONAZEPAM 0.5 MG PO TABS: 0.5000 mg | ORAL_TABLET | Freq: Every day | ORAL | Status: DC

## 2021-10-24 NOTE — Progress Notes (Signed)
Bowel program initiated at 1832 with 10 seconds of dig stim and pressure on the abdomen. Large bowel movement expressed with above interventions.bisacodyl suppository inserted at 1834 per protocol. Wife trained by this nurse to complete bowel program. Will notify oncoming nurse for incomplete bowel program

## 2021-10-24 NOTE — Plan of Care (Signed)
  Problem: RH Swallowing Goal: LTG Patient will consume least restrictive diet using compensatory strategies with assistance ([name]) Description: LTG:  Patient will consume least restrictive diet using compensatory strategies with assistance ([name]) Outcome: Completed/Met

## 2021-10-24 NOTE — Progress Notes (Signed)
Occupational Therapy Session Note  Patient Details  Name: Jason [name] MRN: [id number] Date of Birth: 1956-01-01  Today's Date: 10/24/2021 OT Individual Time: 1000-1100 OT Individual Time Calculation (min): 60 min    Short Term Goals: Week 1:  OT Short Term Goal 1 (Week 1): Pt will perform simple self feeding with mod A with AE and compensatory set up OT Short Term Goal 2 (Week 1): Pt will complete UB bathing supported sitting at sink wiht mod A OT Short Term Goal 3 (Week 1): Pt will complete pull over shirt donning with adapted strategies with mod A OT Short Term Goal 4 (Week 1): Pt will perform sitting EOB with mod A for UE simple functional or reaching task  Skilled Therapeutic Interventions/Progress Updates:  Pt seen for am OT sesison. Pt bed level with wife present upon OT arrival. Pt already dressed therefore OT addressed OOB via SB with 2 person assist with RT [facility] assisting. Moved from supine to sit with max A x 1 this visit with increased initiation of R UE to rail for rolling and pulling up to sit. Sat EOB with max A fading to mod A with significant forward lean to gain stability. SB transfer with full A to place board but able to reach with R UE to initiate trying to assist and with increased L UE initiation as well. Did well with short incremental slides with 2 helpers and repositioned once in [place]. Hair washing sink side with hair tray and w/c tilted. Pt able to move R UE to head for some hair washing otherwise OT provided care. PT able to assist with R UE for towel drying and combing hair. Repositioned L UE with support due to subluxation and left pt in room w/c level with chair alarm belt active, paddle call button and needs within reach.   Therapy Documentation Precautions:  Precautions Precautions: Fall, Cervical Precaution Comments: C6 laminectomy, MAP >65 Restrictions Weight Bearing Restrictions: Yes General:   Vital Signs: Therapy Vitals Temp: 97.9 F (36.6  C) Pulse Rate: 73 Resp: 18 BP: 127/68 Patient Position (if appropriate): Lying Oxygen Therapy SpO2: 100 % O2 Device: Room Air Pain:   ADL: ADL Equipment Provided: Feeding equipment Eating: Maximal assistance Where Assessed-Eating: Bed level Grooming: Moderate assistance Where Assessed-Grooming: Bed level Upper Body Bathing: Dependent Where Assessed-Upper Body Bathing: Bed level Lower Body Bathing: Dependent Where Assessed-Lower Body Bathing: Bed level Upper Body Dressing: Maximal assistance (assist of 2 helpers for sitting balance support) Where Assessed-Upper Body Dressing: Edge of bed Lower Body Dressing: Maximal assistance (assist of 2 helpers for sitting balance) Where Assessed-Lower Body Dressing: Edge of bed Toileting: Dependent Where Assessed-Toileting: Bed level Toilet Transfer: Unable to assess Tub/Shower Transfer: Unable to assess ADL Comments: Pt and spouse report that pt was able to feed self by performing hand <> mouth movements with spouse assist to load the feeding utensil, however, pt was able to perform this feeding task for all of breakfast and 75% of lunch (needed spouse assist d/t fatigue in RUE). Vision   Perception    Praxis   Balance   Exercises:   Other Treatments:     Therapy/Group: Individual Therapy  Barnabas Lister 10/24/2021, 7:47 AM

## 2021-10-24 NOTE — Progress Notes (Signed)
Physical Therapy Session Note  Patient Details  Name: Jason [name] MRN: [id number] Date of Birth: 05-Feb-1956  Today's Date: 10/24/2021 PT Individual Time: 0900-0920 and 930-955 PT Individual Time Calculation (min): 20 min and 25 min  Short Term Goals: Week 1:  PT Short Term Goal 1 (Week 1): pt will perform slideboard transfer with assist PT Short Term Goal 2 (Week 1): Pt with sit EOB with max A or better PT Short Term Goal 3 (Week 1): Pt will tolerate sitting up in w/c between therapy sessions.  Skilled Therapeutic Interventions/Progress Updates: Tx1: Pt presented in bed with wife Shawn Route present agreeable to therapy. Pt denies pain during session. PTA doffed PRAFO's and donned TED hose total A. Pt stated unsure if soiled or not, lowered head to supine and performed rolling to R maxA to check pt. Pt noted to be soiled however RT arrived for bed treatment. Pt set back up for neb treatment and PTA returned to room after neb tx approx 10 min.   Tx2: PTA returned approx 10 min later with neb tx completed and RT leaving room. Pt was then able to perform rolling L/R maxA with wife present and PTA educating wife on use of chux to assist in rolling pt and pt able to turn head to facilitate reaching towards bed rail. Pt was able to hold bed rail without assist particularly with L hand while PTA performed peri-care total A. Once pt cleaned and brief changed PTA threaded pants and pt was able to perform rolling in same manner to pull pants over hips. HOB was then raised to 60 degrees and PTA provided truncal support to get pt into long sit. Pt required maxA to maintain long sit as pt's wife changed shirt total A. Pt then repositioned in bed and pt left resting comfortably in bed with current needs met to await next session.      Therapy Documentation Precautions:  Precautions Precautions: Fall, Cervical Precaution Comments: C6 laminectomy, MAP >65 Restrictions Weight Bearing Restrictions:  Yes General:   Vital Signs: Therapy Vitals Pulse Rate: 80 Resp: 18 Patient Position (if appropriate): Lying Oxygen Therapy SpO2: 93 % O2 Device: Room Air Pain:   Mobility:   Locomotion :    Trunk/Postural Assessment :    Balance:   Exercises:   Other Treatments:      Therapy/Group: Individual Therapy  Desirea Mizrahi 10/24/2021, 1:01 PM

## 2021-10-24 NOTE — Progress Notes (Signed)
PROGRESS NOTE   Subjective/Complaints:  Pt reports going for consult for XRT and simulation this afternoon- wife will join him.  Had some results with bowel program overnight. Still having accidents sometimes.   Ate 100% tray.  [name] to d/c today- is on regular diet and thin liquids and tolerating well.    ROS:  Pt denies SOB, abd pain, CP, N/V/C/D, and vision changes  Except for HPI  Objective:   No results found.  Recent Labs    10/24/21 0506  WBC 9.5  HGB 9.1*  HCT 27.0*  PLT 325   No results for input(s): "NA", "K", "CL", "CO2", "GLUCOSE", "BUN", "CREATININE", "CALCIUM" in the last 72 hours.   Intake/Output Summary (Last 24 hours) at 10/24/2021 0829 Last data filed at 10/24/2021 0500 Gross per 24 hour  Intake --  Output 2150 ml  Net -2150 ml        Physical Exam: Vital Signs Blood pressure 127/68, pulse 73, temperature 97.9 F (36.6 C), resp. rate 18, height _0  (1.753 m), weight 99.9 kg, SpO2 100 %.       General: awake, alert, appropriate, sitting up/propped up in bed; [name] at bedside; feeding self with built up handles- did most of meal; ate 100% tray; NAD HENT: conjugate gaze; oropharynx moist CV: regular rate; no JVD Pulmonary: CTA B/L; no W/R/R- good air movement GI: soft, NT, ND, (+)BS Psychiatric: appropriate Neurological: Ox3- almost constant spasms of RLE Genitourinary:    Comments: Medium amber urine in foley- no change Musculoskeletal: L shoulder K tape in place- still subluxed    Cervical back: Neck supple. Tenderness present.     Comments: RUE- biceps 5/5; WE 4-/5; Triceps 2+/5; Grip 3-/5; FA 2-/5 LUE- biceps 5-/5; WE 4/5; Triceps 2+/5' Grip 3+/5; FA 2-/5 RLE- 0/5 in RLE LLE- HF 1/5; KE 2-/5; DF 1/5; PF 2/5; EHL 1/5   Skin:    General: Skin is warm and dry.  Neurological:     Mental Status: He is alert.     Comments: Soft voice. Decreased postural control --listing to the  right and neck with decreased ROM. Able to passively get to midline without discomfort.    Decreased to light touch at L1 down to S1 B/L- unable to check S2-S5 today Almost constant spasms of RLE during exam; a few spasms seen on LLE Clonus sustained on RLE; and a few beats on LLE Hoffman's (+) B/L Ue's;  Ox3  Assessment/Plan: 1. Functional deficits which require 3+ hours per day of interdisciplinary therapy in a comprehensive inpatient rehab setting. Physiatrist is providing close team supervision and 24 hour management of active medical problems listed below. Physiatrist and rehab team continue to assess barriers to discharge/monitor patient progress toward functional and medical goals  Care Tool:  Bathing    Body parts bathed by patient: Face   Body parts bathed by helper: Right arm, Left arm, Chest, Abdomen, Front perineal area, Buttocks, Left lower leg, Right lower leg, Left upper leg, Right upper leg     Bathing assist Assist Level: Dependent - Patient 0%     Upper Body Dressing/Undressing Upper body dressing   What is the patient wearing?: Pull over  shirt    Upper body assist Assist Level: Maximal Assistance - Patient 25 - 49%    Lower Body Dressing/Undressing Lower body dressing      What is the patient wearing?: Pants     Lower body assist Assist for lower body dressing: Total Assistance - Patient < 25%     Toileting Toileting    Toileting assist Assist for toileting: Dependent - Patient 0%     Transfers Chair/bed transfer  Transfers assist  Chair/bed transfer activity did not occur: Safety/medical concerns  Chair/bed transfer assist level: 2 Helpers (slideboard)     Locomotion Ambulation   Ambulation assist   Ambulation activity did not occur: Safety/medical concerns          Walk 10 feet activity   Assist  Walk 10 feet activity did not occur: Safety/medical concerns        Walk 50 feet activity   Assist Walk 50 feet with 2  turns activity did not occur: Safety/medical concerns         Walk 150 feet activity   Assist Walk 150 feet activity did not occur: Safety/medical concerns         Walk 10 feet on uneven surface  activity   Assist Walk 10 feet on uneven surfaces activity did not occur: Safety/medical concerns         Wheelchair     Assist Is the patient using a wheelchair?: Yes (Pt will use w/c but was not able to get up to chair today) Type of Wheelchair: Manual Wheelchair activity did not occur: Safety/medical concerns         Wheelchair 50 feet with 2 turns activity    Assist    Wheelchair 50 feet with 2 turns activity did not occur: Safety/medical concerns       Wheelchair 150 feet activity     Assist  Wheelchair 150 feet activity did not occur: Safety/medical concerns       Blood pressure 127/68, pulse 73, temperature 97.9 F (36.6 C), resp. rate 18, height _0  (1.753 m), weight 99.9 kg, SpO2 100 %.  Medical Problem List and Plan: 1. 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  Antithrombotics: -DVT/anticoagulation:  Pharmaceutical: Lovenox.               -antiplatelet therapy: ASA 3. Pain Management: Oxycodone prn. 8/28- pt reports no significant pain- and doesn't want spasticity meds at this time.   4. Mood/Behavior/Sleep: LCSW to follow for evaluation and support.              -antipsychotic agents: N/A 5. Neuropsych/cognition: This patient is capable of making decisions on his own behalf. 6. Skin/Wound Care: Routine pressure relief measures              --monitor incision for healing.  7. Fluids/Electrolytes/Nutrition: Monitor I/O. Check CMET in am.  8. Prostate AdenoCA  w/mets: Loading dose Firmagon 240 mg 10/06/21 per Dr. Tasia Catchings --Charlesetta Garibaldi changed to [facility] due to insurance coverage--next dose around 09/13. --Simulation for XRT 08/31 at 2 pm at [facility] 8/31- getting simulation for XRT and consult today 9. Dysphagia:  Continue D3 w/chopped meats and assist at meals. Aspiration precautions.  10. Neurogenic bladder: continue foley for now  8/31- pt doesn't want to remove to  see if can void; or to do in/out caths- thinks too much stress on family.  11. Neurogenic bowel: Will get KUB to determine stool burden --Colace in am with suppository after supper.  8/25- had good results with bowel program 8/27- good results with bowel program yesterday 8/28- having bowel accidents- and BM with bowel program last 3 nights-  8/29- 2 small BM's last night after bowel program- con't regimen 8/31- still having bowel accidents, but going at night with bowel program 12. Sepsis/Likely aspiration PNA: Now on Augmentin--antibiotic D#5/10. BC pend-->neg so far.              --Follow up CXR in am.              --pulmonary hygiene for atelectasis/Asymmetric volume loss on right.  13. Abnormal LFTs: Improved overall except for elevation in Aphos--315. Will recheck in am  8/25- Alk phos up to 457; AST 62 and ALT 96- except for alk phos, they are heading downwards- con't to monitor weekly.  14. ABLA: Recheck in am  -stable 8.7 HGB 8/26,  Recheck tomorrow 8.31- Hb stable at 9.1 15. Leucocytosis: Recheck in am. Monitor for fevers/other signs of infection.    8/25- WBC 17.1k- on Augmentin- will recheck labs in AM  -WBC improved to 15.6 10/19/21  -Recheck CBC tomorrow  8/28- Down to 10.8k- con't to monitor  8/31- WBC 9.5k- doing better 16. Hypotension: Will continue midodrine for now.    8/25- BP better 120s/70s- con't regimen 17. DVT left gastrocnemius vein  -8/26 Noted Lovenox was increased yesterday 8/25 to 128m BID, Distal DVT, will recheck in 1 -2 weeks, compression stockings,   18.  Spasticity  8/28- pt doesn't want spasticity meds- isn't painful or annoying.  1Gilliam 8/28- will d/c staples and [facility] 8[facility]- need to see about FMills Koller-supposed to be given 9/12- Oncology to handle it- L/M/via PA about this.  8/30- per pt, they aren't using Firmagen- are using other product- will d/w Oncology more   Will have pt seen by W/C rep- and justify - will likely need power w/c with ROHO cushion- will add details as we know them    LOS: 7 days A FACE TO FACE EVALUATION WAS PERFORMED  Treva Huyett 10/24/2021, 8:29 AM

## 2021-10-24 NOTE — Progress Notes (Signed)
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 Prostate cancer metastatic to bone [facility])  C61    C79.51       HISTORY OF PRESENT ILLNESS: Jason [name] is a 66 y.o. male who presented the [facility] ER on 10/05/21 with progressive neck and shoulder pain and some paresthesias of his left hand.  MRI scan showed multifocal marrow signal intensity with involvement of the C4-C7 vertebral body consistent with metastatic disease without cord compression.  There was marked canal stenosis at C6 circumferential involvement is present.  CT scan of chest abdomen pelvis showed retroperitoneal and pelvic lymphadenopathy consistent with metastatic disease. PSA on 8/12 was greater than 1,500 representing a rise from 2.6 on 10/10/19.   Patient underwent C6 posterior laminectomy with resection of epidural tumor by Dr. Cari Caraway on 10/06/21  with pathology consistent with adenocarcinoma consistent with prostate origin.  He has been started on Firmagon.  MRI of the thoracic and lumbar spines also showed diffuse involvement.   He is seen today for consideration of palliative treatment.  He is doing well does have some decrease strength in his left lower extremity no significant pain at this time.  Patient was seen in the ICU by Dr. Baruch Gouty and [facility], and has subsequently been transferred to [facility] rehab in [place].  PREVIOUS RADIATION THERAPY: No  PAST MEDICAL HISTORY:  Past Medical History:  Diagnosis Date   Allergy    Arthritis    osteoarthritis   GERD (gastroesophageal reflux disease)    Hypertension     Hypothyroidism    Renal calculi    Thyroid disease    Hyperthyroidism s/p radioactive iodine ablation      PAST SURGICAL HISTORY: Past Surgical History:  Procedure Laterality Date   CARDIAC CATHETERIZATION  03/2000   HERNIA REPAIR  4665   umbilical   LEFT HEART CATH AND CORONARY ANGIOGRAPHY Left 03/20/2020   Procedure: LEFT HEART CATH AND CORONARY ANGIOGRAPHY;  Surgeon: Nelva Bush, MD;  Location: [place] [facility];  Service: Cardiovascular;  Laterality: Left;   POSTERIOR CERVICAL FUSION/FORAMINOTOMY N/A 10/06/2021   Procedure: POSTERIOR CERVICAL FUSION/ FORAMINOTOMY LEVEL 3;  Surgeon: Meade Maw, MD;  Location: [facility];  Service: Neurosurgery;  Laterality: N/A;  will need monitoring   RADIOLOGY WITH ANESTHESIA Left 04/18/2021   Procedure: MRI SHOULDER WITHOUT CONTRAST WITH ANESTHESIA;  Surgeon: Radiologist, Medication, MD;  Location: [place];  Service: Radiology;  Laterality: Left;   RADIOLOGY WITH ANESTHESIA N/A 10/03/2021   Procedure: MRI CERVICAL SPINE WITH ANESTHESIA;  Surgeon: Radiologist, Medication, MD;  Location: [place];  Service: Radiology;  Laterality: N/A;   REVERSE SHOULDER ARTHROPLASTY Left    ROTATOR CUFF REPAIR  11/2010   Dr [name] Heck   TONSILLECTOMY     TOTAL HIP ARTHROPLASTY Right 02/24/2009   TOTAL HIP ARTHROPLASTY Left 06/2016   Dr Harlow Mares    FAMILY HISTORY:  Family History  Problem Relation Age of Onset   Hypertension Mother    Stroke Mother    Gout Father    Heart attack Father 89   Heart disease Sister    Colon cancer Neg Hx  Esophageal cancer Neg Hx    Rectal cancer Neg Hx    Stomach cancer Neg Hx     SOCIAL HISTORY:  Social History   Socioeconomic History   Marital status: Married    Spouse name: [name]    Number of children: 0   Years of education: Not on file   Highest education level: Not on file  Occupational History   Occupation: Therapist, occupational: [employer]  Tobacco Use   Smoking status: Never   Smokeless  tobacco: Never  Vaping Use   Vaping Use: Never used  Substance and Sexual Activity   Alcohol use: Not Currently    Comment: Alcohol once every few weeks/months   Drug use: No   Sexual activity: Yes  Other Topics Concern   Not on file  Social History Narrative   Lives at home with spouse.    Social Determinants of Health   Financial Resource Strain: Not on file  Food Insecurity: Not on file  Transportation Needs: Not on file  Physical Activity: Not on file  Stress: Not on file  Social Connections: Not on file  Intimate Partner Violence: Not on file    ALLERGIES: Patient has no known allergies.  MEDICATIONS:  No current facility-administered medications for this visit.   No current outpatient medications on file.   Facility-Administered Medications Ordered in Other Visits  Medication Dose Route Frequency Provider Last Rate Last Admin   (feeding supplement) PROSource Plus liquid 30 mL  30 mL Oral TID BM Love, Pamela S, PA-C   30 mL at 10/24/21 0840   acetaminophen (TYLENOL) tablet 325-650 mg  325-650 mg Oral Q4H PRN Bary Leriche, PA-C       alum & mag hydroxide-simeth (MAALOX/MYLANTA) 200-200-20 MG/5ML suspension 30 mL  30 mL Oral Q4H PRN Love, Pamela S, PA-C       ascorbic acid (VITAMIN C) tablet 500 mg  500 mg Oral Daily Bary Leriche, PA-C   500 mg at 10/24/21 9485   aspirin EC tablet 81 mg  81 mg Oral Daily Bary Leriche, PA-C   81 mg at 10/24/21 4627   atorvastatin (LIPITOR) tablet 40 mg  40 mg Oral Daily Bary Leriche, PA-C   40 mg at 10/24/21 0350   bisacodyl (DULCOLAX) suppository 10 mg  10 mg Rectal Daily PRN Love, Pamela S, PA-C       bisacodyl (DULCOLAX) suppository 10 mg  10 mg Rectal QPC supper Love, Pamela S, PA-C   10 mg at 10/23/21 1824   budesonide (PULMICORT) nebulizer solution 0.5 mg  0.5 mg Nebulization BID Love, Pamela S, PA-C   0.5 mg at 10/23/21 2319   Chlorhexidine Gluconate Cloth 2 % PADS 6 each  6 each Topical BID Lovorn, Jinny Blossom, MD   6 each at  10/24/21 0538   clonazePAM (KLONOPIN) tablet 0.5 mg  0.5 mg Oral BID Love, Pamela S, PA-C   0.5 mg at 10/24/21 0841   dextromethorphan-guaiFENesin (MUCINEX DM) 30-600 MG per 12 hr tablet 1 tablet  1 tablet Oral BID Bary Leriche, PA-C   1 tablet at 10/24/21 0938   diphenhydrAMINE (BENADRYL) 12.5 MG/5ML elixir 12.5-25 mg  12.5-25 mg Oral Q6H PRN Reesa Chew S, PA-C       docusate sodium (COLACE) capsule 200 mg  200 mg Oral Daily Reesa Chew S, PA-C   200 mg at 10/24/21 0842   enoxaparin (LOVENOX) injection 100 mg  100 mg Subcutaneous BID Pham, Minh Q,  RPH-CPP   100 mg at 10/23/21 2153   famotidine (PEPCID) tablet 20 mg  20 mg Oral Daily Bary Leriche, PA-C   20 mg at 10/24/21 1610   FLUoxetine (PROZAC) capsule 20 mg  20 mg Oral QHS Love, Pamela S, PA-C   20 mg at 10/23/21 2153   guaiFENesin-dextromethorphan (ROBITUSSIN DM) 100-10 MG/5ML syrup 5-10 mL  5-10 mL Oral Q6H PRN Love, Pamela S, PA-C       ipratropium-albuterol (DUONEB) 0.5-2.5 (3) MG/3ML nebulizer solution 3 mL  3 mL Nebulization Q4H PRN Love, Pamela S, PA-C       ipratropium-albuterol (DUONEB) 0.5-2.5 (3) MG/3ML nebulizer solution 3 mL  3 mL Nebulization BID Lovorn, Megan, MD   3 mL at 10/23/21 2319   iron polysaccharides (NIFEREX) capsule 150 mg  150 mg Oral Daily Bary Leriche, PA-C   150 mg at 10/24/21 9604   levothyroxine (SYNTHROID) tablet 88 mcg  88 mcg Oral Q0600 Bary Leriche, PA-C   88 mcg at 10/24/21 0610   lidocaine (XYLOCAINE) 2 % jelly   Topical PRN Love, Pamela S, PA-C       lip balm (BLISTEX) ointment 1 Application  1 Application Topical PRN Love, Pamela S, PA-C       loratadine (CLARITIN) tablet 10 mg  10 mg Oral BID Love, Pamela S, PA-C   10 mg at 10/24/21 5409   melatonin tablet 5 mg  5 mg Oral QHS PRN Love, Pamela S, PA-C       meloxicam (MOBIC) tablet 15 mg  15 mg Oral Daily Bary Leriche, PA-C   15 mg at 10/23/21 2153   midodrine (PROAMATINE) tablet 5 mg  5 mg Oral TID WC LoveIvan Anchors, PA-C   5 mg at 10/24/21  8119   multivitamin with minerals tablet 1 tablet  1 tablet Oral Daily Bary Leriche, PA-C   1 tablet at 10/24/21 1478   Oral care mouth rinse  15 mL Mouth Rinse PRN Love, Pamela S, PA-C       oxyCODONE-acetaminophen (PERCOCET/ROXICET) 5-325 MG per tablet 1-2 tablet  1-2 tablet Oral Q6H PRN Love, Pamela S, PA-C       polyethylene glycol (MIRALAX / GLYCOLAX) packet 17 g  17 g Oral Daily PRN Love, Pamela S, PA-C       potassium chloride (KLOR-CON M) CR tablet 10 mEq  10 mEq Oral QHS Love, Pamela S, PA-C   10 mEq at 10/23/21 2153   prochlorperazine (COMPAZINE) tablet 5-10 mg  5-10 mg Oral Q6H PRN Reesa Chew S, PA-C       Or   prochlorperazine (COMPAZINE) injection 5-10 mg  5-10 mg Intramuscular Q6H PRN Love, Pamela S, PA-C       Or   prochlorperazine (COMPAZINE) suppository 12.5 mg  12.5 mg Rectal Q6H PRN Love, Pamela S, PA-C       sodium chloride (OCEAN) 0.65 % nasal spray 1 spray  1 spray Each Nare PRN Love, Pamela S, PA-C       sodium phosphate (FLEET) 7-19 GM/118ML enema 1 enema  1 enema Rectal Once PRN Love, Pamela S, PA-C       tamsulosin (FLOMAX) capsule 0.4 mg  0.4 mg Oral QPC supper Love, Pamela S, PA-C   0.4 mg at 10/23/21 1733   traZODone (DESYREL) tablet 25-50 mg  25-50 mg Oral QHS PRN Bary Leriche, PA-C        REVIEW OF SYSTEMS:  On review of systems, the  patient reports that he is doing well overall.  A complete review of systems is obtained and is otherwise negative.    PHYSICAL EXAM:  Wt Readings from Last 3 Encounters:  10/17/21 220 lb 3.8 oz (99.9 kg)  10/17/21 220 lb 0.3 oz (99.8 kg)  10/03/21 189 lb (85.7 kg)   Temp Readings from Last 3 Encounters:  10/24/21 97.9 F (36.6 C)  10/17/21 99.3 F (37.4 C)  10/03/21 98.1 F (36.7 C)   BP Readings from Last 3 Encounters:  10/24/21 127/68  10/17/21 133/72  10/03/21 129/78   Pulse Readings from Last 3 Encounters:  10/24/21 73  10/17/21 90  10/03/21 82    /10  In general this is a well appearing man in no  acute distress. He is alert and oriented x4 and appropriate throughout the examination. HEENT reveals that the patient is normocephalic, atraumatic. EOMs are intact. PERRLA.   KPS = 40  100 - Normal; no complaints; no evidence of disease. 90   - Able to carry on normal activity; minor signs or symptoms of disease. 80   - Normal activity with effort; some signs or symptoms of disease. 20   - Cares for self; unable to carry on normal activity or to do active work. 60   - Requires occasional assistance, but is able to care for most of his personal needs. 50   - Requires considerable assistance and frequent medical care. 76   - Disabled; requires special care and assistance. 70   - Severely disabled; hospital admission is indicated although death not imminent. 50   - Very sick; hospital admission necessary; active supportive treatment necessary. 10   - Moribund; fatal processes progressing rapidly. 0     - Dead  Karnofsky DA, Abelmann [place], Craver LS and Burchenal [facility] [phone]) The use of the nitrogen mustards in the palliative treatment of carcinoma: with particular reference to bronchogenic carcinoma Cancer 1 634-56  LABORATORY DATA:  Lab Results  Component Value Date   WBC 9.5 10/24/2021   HGB 9.1 (L) 10/24/2021   HCT 27.0 (L) 10/24/2021   MCV 96.1 10/24/2021   PLT 325 10/24/2021   Lab Results  Component Value Date   NA 139 10/21/2021   K 3.8 10/21/2021   CL 107 10/21/2021   CO2 24 10/21/2021   Lab Results  Component Value Date   ALT 96 (H) 10/18/2021   AST 62 (H) 10/18/2021   ALKPHOS 457 (H) 10/18/2021   BILITOT 0.9 10/18/2021     RADIOGRAPHY: VAS [place] LOWER EXTREMITY VENOUS (DVT)  Result Date: 10/19/2021  Lower Venous DVT Study Patient Name:  KENLEE MALER  Date of Exam:   10/18/2021 Medical Rec #: [id number]         Accession #:    [id number] Date of Birth: [birth year]         Patient Gender: M Patient Age:   46 years Exam Location:  [facility] Procedure:      VAS [place]  LOWER EXTREMITY VENOUS (DVT) Referring Phys: PAMELA LOVE --------------------------------------------------------------------------------  Indications: Quadriplegia.  Comparison Study: 10/11/2021 - No evidence of deep venous thrombosis in either                   lower extremity. Performing Technologist: Oliver Hum RVT  Examination Guidelines: A complete evaluation includes B-mode imaging, spectral Doppler, color Doppler, and power Doppler as needed of all accessible portions of each vessel. Bilateral testing is considered an integral part of a complete  examination. Limited examinations for reoccurring indications may be performed as noted. The reflux portion of the exam is performed with the patient in reverse Trendelenburg.  +---------+---------------+---------+-----------+----------+--------------+ RIGHT    CompressibilityPhasicitySpontaneityPropertiesThrombus Aging +---------+---------------+---------+-----------+----------+--------------+ CFV      Full           Yes      Yes                                 +---------+---------------+---------+-----------+----------+--------------+ SFJ      Full                                                        +---------+---------------+---------+-----------+----------+--------------+ FV Prox  Full                                                        +---------+---------------+---------+-----------+----------+--------------+ FV Mid   Full                                                        +---------+---------------+---------+-----------+----------+--------------+ FV DistalFull                                                        +---------+---------------+---------+-----------+----------+--------------+ PFV      Full                                                        +---------+---------------+---------+-----------+----------+--------------+ POP      Full           Yes      Yes                                  +---------+---------------+---------+-----------+----------+--------------+ PTV      Full                                                        +---------+---------------+---------+-----------+----------+--------------+ PERO     Full                                                        +---------+---------------+---------+-----------+----------+--------------+   +---------+---------------+---------+-----------+----------+--------------+ LEFT     CompressibilityPhasicitySpontaneityPropertiesThrombus Aging +---------+---------------+---------+-----------+----------+--------------+ CFV      Full  Yes      Yes                                 +---------+---------------+---------+-----------+----------+--------------+ SFJ      Full                                                        +---------+---------------+---------+-----------+----------+--------------+ FV Prox  Full                                                        +---------+---------------+---------+-----------+----------+--------------+ FV Mid   Full                                                        +---------+---------------+---------+-----------+----------+--------------+ FV DistalFull                                                        +---------+---------------+---------+-----------+----------+--------------+ PFV      Full                                                        +---------+---------------+---------+-----------+----------+--------------+ POP      Full           Yes      Yes                                 +---------+---------------+---------+-----------+----------+--------------+ PTV      Full                                                        +---------+---------------+---------+-----------+----------+--------------+ PERO     Full                                                         +---------+---------------+---------+-----------+----------+--------------+ Gastroc  None                                         Acute          +---------+---------------+---------+-----------+----------+--------------+     Summary: RIGHT: - There is no evidence of deep vein thrombosis in the lower  extremity.  - No cystic structure found in the popliteal fossa.  LEFT: - Findings consistent with acute deep vein thrombosis involving the left gastrocnemius veins. - No cystic structure found in the popliteal fossa.  *See table(s) above for measurements and observations. Electronically signed by Harold Barban MD on 10/19/2021 at 4:13:36 PM.    Final    DG CHEST PORT 1 VIEW  Result Date: 10/17/2021 CLINICAL DATA:  Diarrhea, hypoxia. EXAM: PORTABLE CHEST 1 VIEW COMPARISON:  Chest radiograph dated 10/12/2021. FINDINGS: The heart size and mediastinal contours are within normal limits. The lung volumes are low. There is mild bibasilar atelectasis. A trace right pleural effusion may contribute. There is no left pleural effusion. There is no pneumothorax. Degenerative changes are seen in the spine. IMPRESSION: Mild bibasilar atelectasis. A trace right pleural effusion may contribute. Electronically Signed   By: Zerita Boers M.D.   On: 10/17/2021 20:08   DG Abd Portable 1V  Result Date: 10/17/2021 CLINICAL DATA:  Diarrhea, hypoxia. EXAM: PORTABLE ABDOMEN - 1 VIEW COMPARISON:  Abdominal radiograph dated 10/12/2021. FINDINGS: The bowel gas pattern is normal. Gas overlies the rectum. No radio-opaque calculi along the urinary tract. Degenerative changes are seen in the spine. Bilateral hip arthroplasties are noted. IMPRESSION: Nonobstructive bowel gas pattern. Electronically Signed   By: Zerita Boers M.D.   On: 10/17/2021 20:07   [place] EKG SITE RITE  Result Date: 10/13/2021 If [facility] image not attached, placement could not be confirmed due to current cardiac rhythm.  DG Chest Port 1 View  Result Date:  10/12/2021 CLINICAL DATA:  Sepsis, fever EXAM: PORTABLE CHEST 1 VIEW COMPARISON:  10/11/2021 FINDINGS: Lung volumes are small with asymmetric right-sided volume loss, unchanged from prior examination. Bibasilar atelectasis noted. No pneumothorax or pleural effusion. Cardiac size is within normal limits. Left internal jugular central venous catheter tip noted at the superior cavoatrial junction. The thoracic aorta is ectatic, similar to prior examination and likely accentuated by poor pulmonary insufflation. Pulmonary vascularity is normal. Left total shoulder arthroplasty has been performed. IMPRESSION: 1. Pulmonary hypoinflation. 2. Bibasilar atelectasis. Electronically Signed   By: Fidela Salisbury M.D.   On: 10/12/2021 20:08   DG Abd 1 View  Result Date: 10/12/2021 CLINICAL DATA:  Constipation EXAM: ABDOMEN - 1 VIEW COMPARISON:  10/07/2021 FINDINGS: No bowel dilatation to suggest obstruction. No evidence of pneumoperitoneum, portal venous gas or pneumatosis. No pathologic calcifications along the expected course of the ureters. No acute osseous abnormality. IMPRESSION: Negative. Electronically Signed   By: Kathreen Devoid M.D.   On: 10/12/2021 17:49   US Venous Img Lower Bilateral (DVT)  Result Date: 10/11/2021 CLINICAL DATA:  CVA. EXAM: BILATERAL LOWER EXTREMITY VENOUS DOPPLER ULTRASOUND TECHNIQUE: Gray-scale sonography with graded compression, as well as color Doppler and duplex ultrasound were performed to evaluate the lower extremity deep venous systems from the level of the common femoral vein and including the common femoral, femoral, profunda femoral, popliteal and calf veins including the posterior tibial, peroneal and gastrocnemius veins when visible. The superficial great saphenous vein was also interrogated. Spectral Doppler was utilized to evaluate flow at rest and with distal augmentation maneuvers in the common femoral, femoral and popliteal veins. COMPARISON:  None Available. FINDINGS: RIGHT  LOWER EXTREMITY Common Femoral Vein: No evidence of thrombus. Normal compressibility, respiratory phasicity and response to augmentation. Saphenofemoral Junction: No evidence of thrombus. Normal compressibility and flow on color Doppler imaging. Profunda Femoral Vein: No evidence of thrombus. Normal compressibility and flow on color Doppler imaging. Femoral  Vein: No evidence of thrombus. Normal compressibility, respiratory phasicity and response to augmentation. Popliteal Vein: No evidence of thrombus. Normal compressibility, respiratory phasicity and response to augmentation. Calf Veins: No evidence of thrombus. Normal compressibility and flow on color Doppler imaging. Superficial Great Saphenous Vein: No evidence of thrombus. Normal compressibility. Venous Reflux:  None. Other Findings: No evidence of superficial thrombophlebitis or abnormal fluid collection. LEFT LOWER EXTREMITY Common Femoral Vein: No evidence of thrombus. Normal compressibility, respiratory phasicity and response to augmentation. Saphenofemoral Junction: No evidence of thrombus. Normal compressibility and flow on color Doppler imaging. Profunda Femoral Vein: No evidence of thrombus. Normal compressibility and flow on color Doppler imaging. Femoral Vein: No evidence of thrombus. Normal compressibility, respiratory phasicity and response to augmentation. Popliteal Vein: No evidence of thrombus. Normal compressibility, respiratory phasicity and response to augmentation. Calf Veins: No evidence of thrombus. Normal compressibility and flow on color Doppler imaging. Superficial Great Saphenous Vein: No evidence of thrombus. Normal compressibility. Venous Reflux:  None. Other Findings: No evidence of superficial thrombophlebitis or abnormal fluid collection. IMPRESSION: No evidence of deep venous thrombosis in either lower extremity. Electronically Signed   By: Aletta Edouard M.D.   On: 10/11/2021 16:57   ECHOCARDIOGRAM LIMITED BUBBLE  STUDY  Result Date: 10/11/2021    ECHOCARDIOGRAM LIMITED REPORT   Patient Name:   ALMALIK WEISSBERG Date of Exam: 10/11/2021 Medical Rec #:  [id number]        Height:       69.0 in Accession #:    [id number]       Weight:       201.3 lb Date of Birth:  22-Mar-1955        BSA:          2.072 m Patient Age:    33 years         BP:           140/75 mmHg Patient Gender: M                HR:           103 bpm. Exam Location:  [facility] Procedure: Limited Echo and Saline Contrast Bubble Study Indications:     I63.9 Stroke  History:         Patient has prior history of Echocardiogram examinations, most                  recent 10/08/2021. Risk Factors:Hypertension.  Sonographer:     Charmayne Sheer Referring Phys:  [place] Diagnosing Phys: Ida Rogue MD IMPRESSIONS  1. Left ventricular ejection fraction, by estimation, is 60 to 65%. The left ventricle has normal function. The left ventricle has no regional wall motion abnormalities. Left ventricular diastolic parameters are indeterminate.  2. Right ventricular systolic function is normal. The right ventricular size is normal  3. Agitated saline contrast bubble study was negative, with no evidence of any interatrial shun  4. Limited study FINDINGS  Left Ventricle: Left ventricular ejection fraction, by estimation, is 60 to 65%. The left ventricle has normal function. The left ventricle has no regional wall motion abnormalities. The left ventricular internal cavity size was normal in size. There is  no left ventricular hypertrophy. Left ventricular diastolic parameters are indeterminate. Right Ventricle: The right ventricular size is normal. No increase in right ventricular wall thickness. Right ventricular systolic function is normal. Left Atrium: Left atrial size was normal in size. Right Atrium: Right atrial size was normal in size. Pericardium: There is  no evidence of pericardial effusion. Mitral Valve: The mitral valve was not well visualized. No evidence of mitral  valve stenosis. Tricuspid Valve: The tricuspid valve is not well visualized. Tricuspid valve regurgitation is not demonstrated. No evidence of tricuspid stenosis. Aortic Valve: The aortic valve was not well visualized. Aortic valve regurgitation is not visualized. No aortic stenosis is present. Pulmonic Valve: The pulmonic valve was not well visualized. Pulmonic valve regurgitation is not visualized. No evidence of pulmonic stenosis. Aorta: The aortic root was not well visualized. Venous: The pulmonary veins were not well visualized. The inferior vena cava was not well visualized. IAS/Shunts: No atrial level shunt detected by color flow Doppler. Agitated saline contrast was given intravenously to evaluate for intracardiac shunting. Agitated saline contrast bubble study was negative, with no evidence of any interatrial shunt. Ida Rogue MD Electronically signed by Ida Rogue MD Signature Date/Time: 10/11/2021/11:41:14 AM    Final    DG Chest Port 1 View  Result Date: 10/11/2021 CLINICAL DATA:  Respiratory failure.  Metastatic prostate cancer. EXAM: PORTABLE CHEST 1 VIEW COMPARISON:  Chest x-ray dated 10/09/2021. FINDINGS: Cardiomediastinal silhouette is stable in size and configuration. LEFT-sided PICC line is stable in position with tip at the level of the lower SVC/cavoatrial junction. Study is hypoinspiratory with crowding of the bibasilar markings, RIGHT greater than LEFT. Lungs appear clear. No pleural effusion or pneumothorax is seen. IMPRESSION: Low lung volumes. No evidence of pneumonia or pulmonary edema. Electronically Signed   By: Franki Cabot M.D.   On: 10/11/2021 08:09   CT ANGIO HEAD NECK W WO CM  Result Date: 10/10/2021 CLINICAL DATA:  Left hand paresthesia.  Metastatic prostate cancer. EXAM: CT ANGIOGRAPHY HEAD AND NECK TECHNIQUE: Multidetector CT imaging of the head and neck was performed using the standard protocol during bolus administration of intravenous contrast. Multiplanar CT  image reconstructions and MIPs were obtained to evaluate the vascular anatomy. Carotid stenosis measurements (when applicable) are obtained utilizing NASCET criteria, using the distal internal carotid diameter as the denominator. RADIATION DOSE REDUCTION: This exam was performed according to the departmental dose-optimization program which includes automated exposure control, adjustment of the mA and/or kV according to patient size and/or use of iterative reconstruction technique. CONTRAST:  30m OMNIPAQUE IOHEXOL 350 MG/ML SOLN COMPARISON:  None Available. FINDINGS: CT HEAD FINDINGS Brain: There is no mass, hemorrhage or extra-axial collection. The size and configuration of the ventricles and extra-axial CSF spaces are normal. There is no acute or chronic infarction. The brain parenchyma is normal. Skull: The visualized skull base, calvarium and extracranial soft tissues are normal. Sinuses/Orbits: No fluid levels or advanced mucosal thickening of the visualized paranasal sinuses. No mastoid or middle ear effusion. The orbits are normal. CTA NECK FINDINGS SKELETON: Numerous sclerotic lesions throughout the cervical spine. OTHER NECK: Normal pharynx, larynx and major salivary glands. No cervical lymphadenopathy. Unremarkable thyroid gland. UPPER CHEST: Right nodal lobe atelectasis and hazy opacities in the right upper lobe. AORTIC ARCH: There is calcific atherosclerosis of the aortic arch. There is no aneurysm, dissection or hemodynamically significant stenosis of the visualized portion of the aorta. Conventional 3 vessel aortic branching pattern. The visualized proximal subclavian arteries are widely patent. RIGHT CAROTID SYSTEM: Normal without aneurysm, dissection or stenosis. LEFT CAROTID SYSTEM: Normal without aneurysm, dissection or stenosis. VERTEBRAL ARTERIES: Right dominant configuration. Both origins are clearly patent. There is no dissection, occlusion or flow-limiting stenosis to the skull base (V1-V3  segments). CTA HEAD FINDINGS POSTERIOR CIRCULATION: --Vertebral arteries: Normal V4 segments. --Inferior cerebellar  arteries: Normal. --Basilar artery: Normal. --Superior cerebellar arteries: Normal. --Posterior cerebral arteries (PCA): Normal. ANTERIOR CIRCULATION: --Intracranial internal carotid arteries: Normal. --Anterior cerebral arteries (ACA): Normal. Both A1 segments are present. Patent anterior communicating artery (a-comm). --Middle cerebral arteries (MCA): Normal. VENOUS SINUSES: As permitted by contrast timing, patent. ANATOMIC VARIANTS: None Review of the MIP images confirms the above findings. Review of the MIP images confirms the above findings IMPRESSION: 1. No emergent large vessel occlusion, hemodynamically significant stenosis, aneurysm or dissection of the major cervical or intracranial arteries. 2. Numerous sclerotic lesions throughout the cervical spine, consistent with osseous metastatic disease. 3. Right middle lobe atelectasis and hazy opacities in the right upper lobe, possibly infection or asymmetric pulmonary edema. 4. Aortic Atherosclerosis (ICD10-I70.0). Electronically Signed   By: Ulyses Jarred M.D.   On: 10/10/2021 21:55   DG Chest Port 1 View  Result Date: 10/09/2021 CLINICAL DATA:  Respiratory failure EXAM: PORTABLE CHEST 1 VIEW COMPARISON:  Film from the previous day. FINDINGS: Cardiac shadow is stable. Endotracheal tube and gastric catheter have been removed in the interval. Left jugular central line is again seen. Lungs are clear. No bony abnormality is noted. IMPRESSION: No acute abnormality noted. Electronically Signed   By: Inez Catalina M.D.   On: 10/09/2021 21:41   MR BRAIN W WO CONTRAST  Result Date: 10/08/2021 CLINICAL DATA:  Extensive metastatic disease with confusion and lethargy. EXAM: MRI HEAD WITHOUT AND WITH CONTRAST TECHNIQUE: Multiplanar, multiecho pulse sequences of the brain and surrounding structures were obtained without and with intravenous contrast.  CONTRAST:  24m GADAVIST GADOBUTROL 1 MMOL/ML IV SOLN COMPARISON:  None Available. FINDINGS: Brain: There is a small focus of diffusion restriction in the right parietal cortex with faint corresponding FLAIR signal abnormality but no abnormal enhancement most in keeping with a small acute infarct. There is no associated hemorrhage or mass effect. There is no other evidence of acute infarct. There is no acute intracranial hemorrhage or extra-axial fluid collection. Background parenchymal volume is normal. The ventricles are normal in size. Gray-white differentiation is preserved. Scattered small foci of FLAIR signal abnormality in the subcortical and periventricular white matter are nonspecific but likely reflects sequela of mild chronic white matter microangiopathy There is no abnormal parenchymal enhancement there is no solid parenchymal lesion. There is calvarial signal abnormality bilaterally likely reflecting osseous metastatic disease. There is intracranial enhancement along the right frontal convexity likely reflecting extraosseous tumor measuring up to 5 mm in thickness. There is additional thin pachymeningeal thickening and enhancement over the right cerebral convexity suspicious for additional tumor extension. There is extracranial extension of tumor into the soft tissues of the overlying scalp measuring up to 3 mm in thickness. Additional extracranial tumor is seen along the scalp at the vertex measuring up to 7 mm in thickness. There is no mass effect on the underlying brain parenchyma due to the intracranial tumor. There is no midline shift. Vascular: Normal flow voids. Skull and upper cervical spine: Osseous metastatic disease is seen in the calvarium with extraosseous extension as described above. Sinuses/Orbits: There is extensive fluid in the paranasal sinuses likely related to instrumentation. The globes and orbits are unremarkable. Other: None. IMPRESSION: 1. 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 There is intracranial extension of tumor along the right frontal convexity measuring up to 5 mm in thickness without mass effect on the underlying brain parenchyma or midline shift. Extracranial extension of tumor in this location measures up to approximately 3  mm. 2. Additional extracranial extension of tumor into the scalp along the vertex measuring up to 7 mm in thickness. 3. Small focus of diffusion restriction in the right parietal cortex most in keeping with a small acute infarct. 4. No evidence of parenchymal metastatic disease. Electronically Signed   By: Valetta Mole M.D.   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 Postop day 1 with worsening shock, confusion, lethargy. EXAM: MRI THORACIC AND LUMBAR SPINE WITHOUT AND WITH CONTRAST TECHNIQUE: Multiplanar and multiecho pulse sequences of the thoracic and lumbar spine were obtained without and with intravenous contrast. CONTRAST:  46m GADAVIST GADOBUTROL 1 MMOL/ML IV SOLN COMPARISON:  Cervical spine MRI dated 1 day prior, CT chest/abdomen/pelvis 10/05/2021 FINDINGS: MRI THORACIC SPINE FINDINGS A single sagittal T2 sequence through the C-spine was obtained. Postsurgical changes reflecting C6 laminectomy are noted. The spinal canal patency is unchanged compared to the postoperative study from 1 day prior. There is no definite cord signal abnormality. There is prevertebral edema which may be postsurgical in nature. Alignment: There is exaggerated thoracic kyphosis. There is no antero or retrolisthesis. Vertebrae: There is extensive signal abnormality throughout the thoracic spine consistent with extensive osseous metastatic disease. There is no evidence of  pathologic fracture. There is breakthrough of the posterior cortex with epidural tumor along the posterior endplates at T9, TW25 and T12 without no significant spinal canal stenosis or evidence of cord compression. Circumferential enhancement is also seen in the spinal canal at the cervicothoracic junction extending superiorly off the field of view and inferiorly to the T3-T4 level likely reflecting epidural tumor also without high-grade spinal canal stenosis or cord compression (for example 27-2, 27-6). Cord: Normal in signal and morphology. There is no abnormal cord enhancement. There is scallop shaped T1 isointense material in the dorsal canal along the cord beginning at the T9-T10 level extending inferiorly into the lumbar spine which anteriorly displaces the cord and cauda equina nerve roots, likely subdural in location of uncertain etiology. The collection measures up to 7 mm in maximal thickness in the axial plane (see image 24-33, 20-9). There is no peripheral enhancement to suggest abscess. Paraspinal and other soft tissues: There is consolidation in the lower lobes, right worse than left, new since the prior CT from 08/12. The paraspinal soft tissues are unremarkable. Disc levels: There is overall mild background degenerative change throughout the thoracic spine. There is no significant disc herniation or spinal canal or neural foraminal stenosis. MRI LUMBAR SPINE FINDINGS Segmentation: Standard; the lowest formed disc space is designated L5-S1. Alignment:  There is no significant antero or retrolisthesis. Vertebrae: There is extensive signal abnormality throughout the lumbar spine and imaged pelvis consistent with extensive osseous metastatic disease, most notably at L1 and L2. There is epidural extension of tumor most notably at L1 where it measures up to 5 mm in thickness and at L2 particularly in the region of the right subarticular zone and along the right lamina (7-14); however, circumferential  enhancement extends inferiorly throughout the lumbar spine into the sacrum likely reflecting additional epidural tumor. The epidural tumor at L1 results in mild canal narrowing without cord/conus compression. The epidural tumor lower in the lumbar spine superimposed on pre-existing degenerative changes contribute to multilevel spinal canal stenosis as described in detail below. There  is no evidence of pathologic fracture. Conus medullaris: Extends to the T12-L1 level. There is crowding of the cauda equina nerve roots due to the above-described presumed subdural fluid collection. There is no abnormal enhancement of the cauda equina nerve roots. Paraspinal and other soft tissues: The paraspinal soft tissues are unremarkable. Disc levels: T12-L1: No significant spinal canal or neural foraminal stenosis L1-L2: Disc degeneration without significant spinal canal or neural foraminal stenosis L2-L3: Disc degeneration and bilateral facet arthropathy result in mild bilateral subarticular zone narrowing without significant neural foraminal stenosis. L3-L4: There is diffuse disc bulge and moderate bilateral facet arthropathy with ligamentum flavum thickening as well as probable epidural tumor resulting in severe spinal canal stenosis with cauda equina nerve root compression and moderate left and mild right neural foraminal stenosis L4-L5: There is diffuse disc bulge and moderate bilateral facet arthropathy as well as probable epidural tumor most notably along the dorsal canal resulting in severe spinal canal stenosis with cauda equina nerve root compression and moderate left and mild right neural foraminal stenosis L5-S1: There is a diffuse disc bulge and bilateral facet arthropathy as well as epidural tumor along the dorsal and ventral canal resulting in moderate spinal canal stenosis with bilateral subarticular zone narrowing and moderate left worse than right neural foraminal stenosis. IMPRESSION: 1. Extensive osseous  metastatic disease throughout the thoracolumbar spine and pelvis. 2. 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 3. Additional epidural tumor along the posterior endplates at T9, C37, and T12 without high-grade spinal canal stenosis or cord compression. 4. Extensive epidural tumor in the lumbar spine is greatest in thickness at L1 without high-grade spinal canal stenosis at this level; however, superimposed on pre-existing degenerative changes lower in the lumbar spine results in severe spinal canal stenosis with cauda equina nerve root compression at L3-L4 and L4-L5. Multilevel neural foraminal stenosis is detailed above. 5. Postsurgical changes reflecting C6 laminectomy unchanged spinal canal stenosis compared to the postoperative study from 1 day prior. No definite cord signal abnormality. 6. Extramedullary fluid collection along the dorsal aspect of the cord beginning at T9-T10 extending into the lumbar spine is likely subdural in location but is of uncertain etiology; evolving blood products not excluded. The collection anteriorly displaces the cord and cauda equina nerve roots without frank cord compression or signal abnormality. There is no peripheral enhancement to suggest abscess. 7. Prevertebral edema in the cervical spine which may be postsurgical in nature. 8. New bilateral lower lobe consolidations may reflect atelectasis or developing pneumonia/aspiration. Electronically Signed   By: Valetta Mole M.D.   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 Postop day 1 with worsening shock, confusion,  lethargy. EXAM: MRI THORACIC AND LUMBAR SPINE WITHOUT AND WITH CONTRAST TECHNIQUE: Multiplanar and multiecho pulse sequences of the thoracic and lumbar spine were obtained without and with intravenous contrast. CONTRAST:  57m GADAVIST GADOBUTROL 1 MMOL/ML IV SOLN COMPARISON:  Cervical spine MRI dated 1 day prior, CT chest/abdomen/pelvis 10/05/2021 FINDINGS: MRI THORACIC SPINE FINDINGS A single sagittal T2 sequence through the C-spine was obtained. Postsurgical changes reflecting C6 laminectomy are noted. The spinal canal patency is unchanged compared to the postoperative study from  1 day prior. There is no definite cord signal abnormality. There is prevertebral edema which may be postsurgical in nature. Alignment: There is exaggerated thoracic kyphosis. There is no antero or retrolisthesis. Vertebrae: There is extensive signal abnormality throughout the thoracic spine consistent with extensive osseous metastatic disease. There is no evidence of pathologic fracture. There is breakthrough of the posterior cortex with epidural tumor along the posterior endplates at T9, P61, and T12 without no significant spinal canal stenosis or evidence of cord compression. Circumferential enhancement is also seen in the spinal canal at the cervicothoracic junction extending superiorly off the field of view and inferiorly to the T3-T4 level likely reflecting epidural tumor also without high-grade spinal canal stenosis or cord compression (for example 27-2, 27-6). Cord: Normal in signal and morphology. There is no abnormal cord enhancement. There is scallop shaped T1 isointense material in the dorsal canal along the cord beginning at the T9-T10 level extending inferiorly into the lumbar spine which anteriorly displaces the cord and cauda equina nerve roots, likely subdural in location of uncertain etiology. The collection measures up to 7 mm in maximal thickness in the axial plane (see image 24-33, 20-9). There is no peripheral  enhancement to suggest abscess. Paraspinal and other soft tissues: There is consolidation in the lower lobes, right worse than left, new since the prior CT from 08/12. The paraspinal soft tissues are unremarkable. Disc levels: There is overall mild background degenerative change throughout the thoracic spine. There is no significant disc herniation or spinal canal or neural foraminal stenosis. MRI LUMBAR SPINE FINDINGS Segmentation: Standard; the lowest formed disc space is designated L5-S1. Alignment:  There is no significant antero or retrolisthesis. Vertebrae: There is extensive signal abnormality throughout the lumbar spine and imaged pelvis consistent with extensive osseous metastatic disease, most notably at L1 and L2. There is epidural extension of tumor most notably at L1 where it measures up to 5 mm in thickness and at L2 particularly in the region of the right subarticular zone and along the right lamina (7-14); however, circumferential enhancement extends inferiorly throughout the lumbar spine into the sacrum likely reflecting additional epidural tumor. The epidural tumor at L1 results in mild canal narrowing without cord/conus compression. The epidural tumor lower in the lumbar spine superimposed on pre-existing degenerative changes contribute to multilevel spinal canal stenosis as described in detail below. There is no evidence of pathologic fracture. Conus medullaris: Extends to the T12-L1 level. There is crowding of the cauda equina nerve roots due to the above-described presumed subdural fluid collection. There is no abnormal enhancement of the cauda equina nerve roots. Paraspinal and other soft tissues: The paraspinal soft tissues are unremarkable. Disc levels: T12-L1: No significant spinal canal or neural foraminal stenosis L1-L2: Disc degeneration without significant spinal canal or neural foraminal stenosis L2-L3: Disc degeneration and bilateral facet arthropathy result in mild bilateral  subarticular zone narrowing without significant neural foraminal stenosis. L3-L4: There is diffuse disc bulge and moderate bilateral facet arthropathy with ligamentum flavum thickening as well as probable epidural tumor resulting in severe spinal canal stenosis with cauda equina nerve root compression and moderate left and mild right neural foraminal stenosis L4-L5: There is diffuse disc bulge and moderate bilateral facet arthropathy as well as probable epidural tumor most notably along the dorsal canal resulting in severe spinal canal stenosis with cauda equina nerve root compression and moderate left and mild right neural foraminal stenosis L5-S1: There is a diffuse disc bulge and bilateral facet arthropathy as well as epidural tumor  along the dorsal and ventral canal resulting in moderate spinal canal stenosis with bilateral subarticular zone narrowing and moderate left worse than right neural foraminal stenosis. IMPRESSION: 1. 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 2. 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 3. Additional epidural tumor along the posterior endplates at T9, J18, and T12 without high-grade spinal canal stenosis or cord compression. 4. Extensive epidural tumor in the lumbar spine is greatest in thickness at L1 without high-grade spinal canal stenosis at this level; however, superimposed on pre-existing degenerative changes lower in the lumbar spine results in severe spinal canal stenosis with cauda equina nerve root compression at L3-L4 and L4-L5. Multilevel neural foraminal stenosis is detailed above. 5. Postsurgical changes reflecting C6 laminectomy unchanged spinal canal stenosis compared to the postoperative study from 1 day prior. No definite cord signal abnormality. 6. Extramedullary  fluid collection along the dorsal aspect of the cord beginning at T9-T10 extending into the lumbar spine is likely subdural in location but is of uncertain etiology; evolving blood products not excluded. The collection anteriorly displaces the cord and cauda equina nerve roots without frank cord compression or signal abnormality. There is no peripheral enhancement to suggest abscess. 7. Prevertebral edema in the cervical spine which may be postsurgical in nature. 8. New bilateral lower lobe consolidations may reflect atelectasis or developing pneumonia/aspiration. Electronically Signed   By: Valetta Mole M.D.   On: 10/08/2021 13:18   ECHOCARDIOGRAM COMPLETE  Result Date: 10/08/2021    ECHOCARDIOGRAM REPORT   Patient Name:   DEXTER SIGNOR Date of Exam: 10/08/2021 Medical Rec #:  [id number]        Height:       69.0 in Accession #:    [id number]       Weight:       201.3 lb Date of Birth:  1955-06-15        BSA:          2.072 m Patient Age:    28 years         BP:           116/51 mmHg Patient Gender: M                HR:           56 bpm. Exam Location:  [facility] Procedure: 2D Echo, Cardiac Doppler and Color Doppler Indications:     Other abnormalities of the heart 00.8  History:         Patient has prior history of Echocardiogram examinations, most                  recent 07/19/2020. Risk Factors:Hypertension. GERD.  Sonographer:     Sherrie Sport Referring Phys:  [id number] Emeterio Reeve Diagnosing Phys: Isaias Cowman MD  Sonographer Comments: Technically challenging study due to limited acoustic windows, echo performed with patient supine and on artificial respirator, no subcostal window and no parasternal window. IMPRESSIONS  1. Left ventricular ejection fraction, by estimation, is 55 to 60%. The left ventricle has normal function. The left ventricle has no regional wall motion abnormalities. Left ventricular diastolic parameters were normal.  2. Right ventricular systolic function is normal. The right  ventricular size is normal.  3. The mitral valve is normal in structure. Mild mitral valve regurgitation. No evidence of mitral stenosis.  4. The aortic  valve is normal in structure. Aortic valve regurgitation is not visualized. Mild aortic valve stenosis.  5. The inferior vena cava is normal in size with greater than 50% respiratory variability, suggesting right atrial pressure of 3 mmHg. FINDINGS  Left Ventricle: Left ventricular ejection fraction, by estimation, is 55 to 60%. The left ventricle has normal function. The left ventricle has no regional wall motion abnormalities. The left ventricular internal cavity size was normal in size. There is  no left ventricular hypertrophy. Left ventricular diastolic parameters were normal. Right Ventricle: The right ventricular size is normal. No increase in right ventricular wall thickness. Right ventricular systolic function is normal. Left Atrium: Left atrial size was normal in size. Right Atrium: Right atrial size was normal in size. Pericardium: There is no evidence of pericardial effusion. Mitral Valve: The mitral valve is normal in structure. Mild mitral valve regurgitation. No evidence of mitral valve stenosis. Tricuspid Valve: The tricuspid valve is normal in structure. Tricuspid valve regurgitation is mild . No evidence of tricuspid stenosis. Aortic Valve: The aortic valve is normal in structure. Aortic valve regurgitation is not visualized. Mild aortic stenosis is present. Aortic valve mean gradient measures 7.0 mmHg. Aortic valve peak gradient measures 11.6 mmHg. Aortic valve area, by VTI measures 2.09 cm. Pulmonic Valve: The pulmonic valve was normal in structure. Pulmonic valve regurgitation is not visualized. No evidence of pulmonic stenosis. Aorta: The aortic root is normal in size and structure. Venous: The inferior vena cava is normal in size with greater than 50% respiratory variability, suggesting right atrial pressure of 3 mmHg. IAS/Shunts: No atrial  level shunt detected by color flow Doppler.  LEFT VENTRICLE PLAX 2D LVIDd:         3.90 cm   Diastology LVIDs:         2.80 cm   LV e' medial:    6.85 cm/s LV PW:         1.00 cm   LV E/e' medial:  15.6 LV IVS:        1.40 cm   LV e' lateral:   12.70 cm/s LVOT diam:     2.10 cm   LV E/e' lateral: 8.4 LV SV:         65 LV SV Index:   31 LVOT Area:     3.46 cm  RIGHT VENTRICLE RV Basal diam:  3.70 cm RV S prime:     16.80 cm/s TAPSE (M-mode): 2.1 cm LEFT ATRIUM             Index        RIGHT ATRIUM           Index LA diam:        4.50 cm 2.17 cm/m   RA Area:     19.50 cm LA Vol (A2C):   62.1 ml 29.98 ml/m  RA Volume:   51.40 ml  24.81 ml/m LA Vol (A4C):   73.2 ml 35.33 ml/m LA Biplane Vol: 70.9 ml 34.22 ml/m  AORTIC VALVE AV Area (Vmax):    1.75 cm AV Area (Vmean):   1.87 cm AV Area (VTI):     2.09 cm AV Vmax:           170.33 cm/s AV Vmean:          115.967 cm/s AV VTI:            0.312 m AV Peak Grad:      11.6 mmHg AV Mean Grad:  7.0 mmHg LVOT Vmax:         86.20 cm/s LVOT Vmean:        62.500 cm/s LVOT VTI:          0.188 m LVOT/AV VTI ratio: 0.60 MITRAL VALVE                TRICUSPID VALVE MV Area (PHT): 3.15 cm     TR Peak grad:   12.4 mmHg MV Decel Time: 241 msec     TR Vmax:        176.00 cm/s MV E velocity: 107.00 cm/s MV A velocity: 102.00 cm/s  SHUNTS MV E/A ratio:  1.05         Systemic VTI:  0.19 m                             Systemic Diam: 2.10 cm Isaias Cowman MD Electronically signed by Isaias Cowman MD Signature Date/Time: 10/08/2021/1:07:52 PM    Final    DG Chest Port 1 View  Result Date: 10/08/2021 CLINICAL DATA:  Difficulty breathing EXAM: PORTABLE CHEST 1 VIEW COMPARISON:  Previous studies including the examination of 10/07/2021 FINDINGS: Cardiac size is within normal limits. Thoracic aorta is tortuous and ectatic. Tip of endotracheal tube is 4.5 cm above the carina. Enteric tube is noted traversing the esophagus with distal portion of the tube in the stomach. Tip of  left IJ central venous catheter is seen in right atrium. There are no signs of pulmonary edema. There is poor inspiration. No new focal pulmonary consolidation is seen. There is improvement in aeration of lower lung fields. There is no significant pleural effusion or pneumothorax. Left shoulder arthroplasty is noted. IMPRESSION: There are no signs of pulmonary edema or focal pulmonary consolidation. There is improvement in aeration of lower lung fields suggesting resolving subsegmental atelectasis. Electronically Signed   By: Elmer Picker M.D.   On: 10/08/2021 08:28   DG Abd 1 View  Result Date: 10/07/2021 CLINICAL DATA:  Nasogastric tube placement. EXAM: ABDOMEN - 1 VIEW COMPARISON:  October 07, 2021 (6:31 p.m.) FINDINGS: A nasogastric tube is seen with its distal tip seen within the body of the stomach. Properly positioned endotracheal tube and left internal jugular venous catheter are also seen. The bowel gas pattern within the upper abdomen is normal. The remainder of the abdomen and pelvis are not included in the field of view and are limited in evaluation. IMPRESSION: Nasogastric tube positioning, as described above. Electronically Signed   By: Virgina Norfolk M.D.   On: 10/07/2021 21:45   MR CERVICAL SPINE WO CONTRAST  Result Date: 10/07/2021 CLINICAL DATA:  Unable to move legs. Posterior cervical decompression yesterday for metastatic disease. EXAM: MRI CERVICAL SPINE WITHOUT CONTRAST TECHNIQUE: Multiplanar, multisequence MR imaging of the cervical spine was performed. No intravenous contrast was administered. COMPARISON:  CT cervical spine dated October 05, 2021. MRI cervical spine dated October 03, 2021. FINDINGS: Alignment: New straightening of the normal cervical lordosis. No significant listhesis. Vertebrae: Similar abnormal marrow signal involving the C4 posterior elements and C5 through T2 vertebral bodies and posterior elements. Cord: No abnormal signal. Dorsal epidural tumor at C4 is  unchanged. Circumferential epidural tumor from C5-C7 appears similar. No definite superimposed epidural fluid collection. Posterior Fossa, vertebral arteries, paraspinal tissues: Postsurgical changes in the paraspinous soft tissues. No fluid collection. Mild prevertebral soft tissue swelling. Disc levels: C2-C3: Negative disc. Unchanged moderate left and mild right facet arthropathy.  No stenosis. C3-C4: Unchanged small posterior disc osteophyte complex, mild bilateral uncovertebral hypertrophy, and moderate left and mild right facet arthropathy. Unchanged mild spinal canal stenosis. Unchanged moderate left neuroforaminal stenosis. No right neuroforaminal stenosis. C4-C5: Unchanged mild disc bulging and moderate right facet uncovertebral hypertrophy. Similar dorsal epidural tumor. New moderate spinal canal stenosis. Unchanged mild bilateral neuroforaminal stenosis. C5-C6: Interval bilateral C6 hemilaminectomies. Unchanged bulky posterior disc osteophyte complex and severe bilateral uncovertebral hypertrophy. Similar circumferential epidural tumor extending into both neural foramina. Progressive severe spinal canal stenosis. Unchanged biforaminal effacement. C6-C7: Unchanged small posterior disc osteophyte complex and severe bilateral uncovertebral hypertrophy. Similar circumferential epidural tumor extending into both neural foramina. Progressive severe spinal canal stenosis. Unchanged biforaminal effacement. C7-T1: Negative disc. Unchanged partial effacement of the left neural foramen due to tumor. No spinal canal stenosis. IMPRESSION: 1. Interval bilateral C6 hemilaminectomies. Similar degree of epidural tumor from C4-C7 without definite superimposed epidural hematoma or fluid collection. 2. Progressive spinal canal stenosis from C4-C7, severe at C5-C6 and C6-C7. No abnormal spinal cord signal or cord infarct. 3. Similar diffuse cervical osseous metastatic disease. These results were called by telephone at the time  of interpretation on 10/07/2021 at 8:05 pm to provider [facility] , who verbally acknowledged these results. Electronically Signed   By: Titus Dubin M.D.   On: 10/07/2021 20:48   DG Abd 1 View  Result Date: 10/07/2021 CLINICAL DATA:  Orogastric tube placement. EXAM: ABDOMEN - 1 VIEW COMPARISON:  None Available. FINDINGS: An enteric tube is seen with its distal tip approximately 3.6 cm distal to the expected region of the gastroesophageal junction. The bowel gas pattern is normal. A large stool burden is seen within the ascending and proximal transverse colon. No radio-opaque calculi or other significant radiographic abnormality are seen. IMPRESSION: 1. Enteric tube positioning, as described above. Further advancement of the NG tube by proximally 7 cm is recommended to decrease the risk of aspiration. 2. Large stool burden without evidence of bowel obstruction. Electronically Signed   By: Virgina Norfolk M.D.   On: 10/07/2021 18:45   DG Abd 1 View  Result Date: 10/07/2021 CLINICAL DATA:  Orogastric tube placement. EXAM: ABDOMEN - 1 VIEW COMPARISON:  October 07, 2021 FINDINGS: An enteric tube is seen with its distal tip approximately 3.4 cm distal to the expected region of the gastroesophageal junction. This is unchanged in position when compared to the prior study. The bowel gas pattern is normal. A large stool burden is seen within the ascending and proximal transverse colon. No radio-opaque calculi or other significant radiographic abnormality are seen. IMPRESSION: 1. Enteric tube positioning, as described above. Further advancement of the NG tube by approximately 6 cm is recommended to decrease the risk of aspiration. 2. Large stool burden within the ascending and proximal transverse colon, without evidence of bowel obstruction. Electronically Signed   By: Virgina Norfolk M.D.   On: 10/07/2021 18:43   DG Chest Port 1 View  Result Date: 10/07/2021 CLINICAL DATA:  Hypertension, postoperative  day 1 for cervical spine surgery. EXAM: PORTABLE CHEST 1 VIEW COMPARISON:  10/07/2021 4:51 p.m. FINDINGS: An endotracheal tube is present with tip 3.6 cm above the carina. Left central line tip: Lower SVC. A drain is coiled over the right upper chest and lower neck. Reverse left shoulder arthroplasty. Low lung volumes are present, causing crowding of the pulmonary vasculature. Mild bandlike atelectasis in the perihilar regions and bases. No appreciable pneumothorax. There is some vague osseous densities likely reflecting the patient's known  osseous metastatic disease. IMPRESSION: 1. Endotracheal tube placed with tip 3.6 cm above the carina. Left central line tip: Lower SVC. No pneumothorax. 2. Low lung volumes are present with scattered perihilar and basilar atelectasis. 3. Hazy osseous densities likely reflecting the patient's known osseous metastatic disease. Electronically Signed   By: Van Clines M.D.   On: 10/07/2021 18:35   DG Chest Port 1 View  Result Date: 10/07/2021 CLINICAL DATA:  Per MD note, sepsis.  [id number] EXAM: PORTABLE CHEST 1 VIEW COMPARISON:  Chest x-ray 03/11/2020, CT chest 10/05/2021 FINDINGS: Surgical drain and skin staples overly the neck. The heart and mediastinal contours are unchanged. Slightly low lung volumes. Curvilinear densities overlying the right lower lung zone that appear to extend along the anterior ribs likely related to known metastatic osseous disease. No definite focal consolidation. No pulmonary edema. No pleural effusion. No pneumothorax. No acute osseous abnormality. IMPRESSION: 1. Slightly low lung volumes with no acute cardiopulmonary abnormality. 2. Curvilinear densities overlying the right lower lung zone that appear to extend along the anterior ribs likely related to known metastatic osseous disease. Electronically Signed   By: Iven Finn M.D.   On: 10/07/2021 17:09   DG Cervical Spine 2-3 Views  Result Date: 10/06/2021 CLINICAL DATA:  Cervical  surgery EXAM: CERVICAL SPINE - 2-3 VIEW COMPARISON:  CT 10/05/2021 FINDINGS: A single C-arm fluoroscopic image was obtained intraoperatively and submitted for post operative interpretation. Lateral image of the cervical spine with tissue spreaders present posteriorly. 2 seconds fluoroscopy time utilized. Radiation dose: 0.9 mGy. Please see the performing provider's procedural report for further detail. IMPRESSION: Intraoperative fluoroscopic image during cervical spine surgery. Electronically Signed   By: Davina Poke D.O.   On: 10/06/2021 09:28   DG C-Arm 1-60 Min-No Report  Result Date: 10/06/2021 Fluoroscopy was utilized by the requesting physician.  No radiographic interpretation.   CT CERVICAL SPINE WO CONTRAST  Result Date: 10/05/2021 CLINICAL DATA:  Metastatic disease of the cervical spine. Unknown primary. EXAM: CT CERVICAL SPINE WITHOUT CONTRAST TECHNIQUE: Multidetector CT imaging of the cervical spine was performed without intravenous contrast. Multiplanar CT image reconstructions were also generated. RADIATION DOSE REDUCTION: This exam was performed according to the departmental dose-optimization program which includes automated exposure control, adjustment of the mA and/or kV according to patient size and/or use of iterative reconstruction technique. COMPARISON:  MRI of the cervical spine without contrast 10/03/2021 FINDINGS: Alignment: Slight degenerative anterolisthesis again noted at C3-4. Alignment is otherwise anatomic. Reversal of the normal cervical lordosis is noted. Skull base and vertebrae: Craniocervical junction is within normal limits. Extensive heterogeneous sclerotic lesions are present in the cervical spine. Left-sided sclerotic lesions present at C5 and inferiorly at C4 corresponding 2 marrow signal changes on the MRI scan. Diffuse sclerotic changes present at C6 and C7. Mixed sclerotic and lytic changes are present in the posterior elements at C5 and C6. No pathologic  fractures are present. Heterogeneous sclerotic changes are present within the spinous process at T1 and T2. Soft tissues and spinal canal: The extraosseous tumor at C5 and C6 is better appreciated on MRI. Prevertebral soft tissues are within normal limits. Disc levels: Central and foraminal narrowing is greatest at C5-6 and C6-7 is described on the MRI scan. Upper chest: Lung apices are clear. IMPRESSION: 1. Extensive heterogeneous sclerotic lesions throughout the cervical spine compatible with metastatic disease to the bone. 2. Mixed sclerotic and lytic changes in the posterior elements at C5 and C6. 3. Extraosseous tumor at C5 and C6 is better  appreciated on the MRI scan. 4. No pathologic fractures. 5. Central and foraminal narrowing is greatest at C5-6 and C6-7 is described on the MRI scan. Electronically Signed   By: San Morelle M.D.   On: 10/05/2021 12:54   CT CHEST ABDOMEN PELVIS W CONTRAST  Result Date: 10/05/2021 CLINICAL DATA:  Metastatic disease evaluation. EXAM: CT CHEST, ABDOMEN, AND PELVIS WITH CONTRAST TECHNIQUE: Multidetector CT imaging of the chest, abdomen and pelvis was performed following the standard protocol during bolus administration of intravenous contrast. RADIATION DOSE REDUCTION: This exam was performed according to the departmental dose-optimization program which includes automated exposure control, adjustment of the mA and/or kV according to patient size and/or use of iterative reconstruction technique. CONTRAST:  141m OMNIPAQUE IOHEXOL 300 MG/ML  SOLN COMPARISON:  None Available. FINDINGS: CT CHEST FINDINGS Cardiovascular: No significant vascular findings. Normal heart size. No pericardial effusion. Mediastinum/Nodes: No enlarged mediastinal, hilar, or axillary lymph nodes. Thyroid gland, trachea, and esophagus demonstrate no significant findings. Lungs/Pleura: Mild lingular and mild right middle lobe linear atelectasis is seen. There is no evidence of acute infiltrate,  pleural effusion or pneumothorax. Musculoskeletal: A left shoulder replacement is seen with associated streak artifact. Hazy, sclerotic appearing areas are seen involving the anterolateral aspects of the second and fourth right ribs. Multilevel degenerative changes seen throughout the thoracic spine. CT ABDOMEN PELVIS FINDINGS Hepatobiliary: No focal liver abnormality is seen. No gallstones, gallbladder wall thickening, or biliary dilatation. Pancreas: Unremarkable. No pancreatic ductal dilatation or surrounding inflammatory changes. Spleen: Punctate calcified granulomas are seen within the posterior aspect of an otherwise normal-appearing spleen. Adrenals/Urinary Tract: Adrenal glands are unremarkable. Kidneys are normal, without renal calculi, focal lesion, or hydronephrosis. The urinary bladder is limited in evaluation secondary to the presence of overlying streak artifact. Stomach/Bowel: Stomach is within normal limits. The appendix is not identified. Stool is seen throughout the large bowel. No evidence of bowel wall thickening, distention, or inflammatory changes. Noninflamed diverticula are seen throughout the sigmoid colon. Vascular/Lymphatic: Aortic atherosclerosis. Moderate severity Para-aortic and aortocaval lymphadenopathy is seen. This extends into the pelvis along the bilateral iliac chains, right greater than left Reproductive: The prostate gland is markedly limited in evaluation secondary to the presence of overlying streak artifact. Other: No abdominal wall hernia or abnormality. No abdominopelvic ascites. 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 There is an extensive amount of associated streak artifact with subsequently limited evaluation of the adjacent osseous and soft tissue structures. Small lytic areas are seen at the levels of L4 and S1, with diffusely sclerotic changes seen throughout the remainder of the sacrum and left iliac bone. A mixed cystic and sclerotic lesion is  seen within the right iliac bone. Multilevel degenerative changes are seen throughout the lumbar spine. IMPRESSION: 1. Moderate severity retroperitoneal and pelvic lymphadenopathy, consistent with metastatic disease. 2. Small lytic areas at the levels of L4 and S1, with diffusely sclerotic changes involving the second and fourth right ribs, sacrum and left iliac bone. 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 Further evaluation with a whole body nuclear medicine bone scan is recommended. 3. 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 4. Sigmoid diverticulosis. 5. Aortic atherosclerosis. Aortic Atherosclerosis (ICD10-I70.0). Electronically Signed   By: TVirgina NorfolkM.D.   On: 10/05/2021 01:56   MR CERVICAL SPINE WO CONTRAST  Result Date: 10/03/2021 CLINICAL DATA:  Neck and left shoulder pain EXAM: MRI CERVICAL SPINE WITHOUT CONTRAST TECHNIQUE: Multiplanar, multisequence MR imaging of the cervical spine was performed.  No intravenous contrast was administered. COMPARISON:  None Available. FINDINGS: Alignment: Reversal of cervical lordosis. Mild anterolisthesis at C3-C4. Vertebrae: Multifocal abnormal marrow signal particularly involving C4-C7 and included upper thoracic vertebral bodies. This includes the posterior elements. Dorsal epidural soft tissue is present at C4. Circumferential involvement is present at C5-C7. There is marked canal stenosis dorsal to C6 without cord compression. Cord: No abnormal signal. Posterior Fossa, vertebral arteries, paraspinal tissues: Partially imaged paranasal sinus opacification. Disc levels: C2-C3:  Left facet hypertrophy.  No canal or foraminal stenosis. C3-C4: Disc bulge with endplate osteophytes. Left facet hypertrophy. Effacement of the ventral subarachnoid space without significant canal narrowing. No foraminal stenosis. C4-C5: Disc bulge with superimposed small right foraminal protrusion and endplate  osteophytes. Left greater than right facet hypertrophy. Dorsal epidural soft tissue. No canal stenosis. No left foraminal stenosis. Probable mild right foraminal stenosis secondary to extraosseous disease. C5-C6: Disc bulge with endplate osteophytes. Facet and uncovertebral hypertrophy. Circumferential epidural soft tissue. Moderate to marked canal stenosis at disc level. Effacement of left greater than right foramina due to extraosseous disease. C6-C7: Disc bulge with endplate osteophytes. Uncovertebral and facet hypertrophy. Moderate canal stenosis at disc level. Effacement of left greater than right foramina due to extraosseous disease. C7-T1: No canal or right foraminal stenosis. Partial effacement left foramen due to extraosseous disease. IMPRESSION: Multifocal abnormal marrow signal with relatively diffuse involvement of C4 into the thoracic spine. Associated epidural disease, greatest at C6 where there is marked canal stenosis without cord compression. Left greater than right foraminal effacement due to extraosseous disease. May reflect metastatic disease or marrow infiltrative neoplastic process. These results will be called to the ordering clinician or representative by the Radiologist Assistant, and communication documented in the PACS or [employer]. Electronically Signed   By: Macy Mis M.D.   On: 10/03/2021 10:28      IMPRESSION/PLAN: 47. 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  Today, I talked to the patient and his wife about the findings and work-up thus far.  We discussed the natural history of spinal metastases from prostate cancer and general treatment, highlighting the role of radiotherapy in the management.  We discussed the available radiation techniques, and focused on the details of logistics and delivery.  We reviewed the anticipated acute and late  sequelae associated with radiation in this setting.  The patient was encouraged to ask questions that I answered to the best of my ability.  The patient would like to proceed with radiation and will be scheduled for CT simulation.  I personally spent 60 minutes in this encounter including chart review, reviewing radiological studies, meeting face-to-face with the patient, entering orders and completing documentation.    Nicholos Johns, PA-C    Tyler Pita, MD  [facility] Direct Dial: [phone]  Fax: [phone] [email or web address]  Skype  [email or web address]

## 2021-10-24 NOTE — Progress Notes (Signed)
  Radiation Oncology         [phone] ________________________________  Name: Jason [name] MRN: [id number]  Date: 10/24/2021  DOB: 1955-10-18  SIMULATION AND TREATMENT PLANNING NOTE    ICD-10-CM   1. 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  Identity was confirmed.  All relevant records and images related to the planned course of therapy were reviewed.  The patient freely provided informed written consent to proceed with treatment after reviewing the details related to the planned course of therapy. The consent form was witnessed and verified by the simulation staff.  Then, the patient was set-up in a stable reproducible  supine position for radiation therapy.  CT images were obtained.  Surface markings were placed.  The CT images were loaded into the planning software.  Then the target and avoidance structures were contoured.  Treatment planning then occurred.  The radiation prescription was entered and confirmed.  Then, I designed and supervised the construction of a total of 3 medically necessary complex treatment device including a custom made thermoplastic mask used for immobilization, and two MLC collimator apertures for radiotherapy from the right and left side, with independent collimation for each to account for beam divergence.  I have requested : Isodose Plan.    PLAN:  The involved c-spine including C4 down to T2 will be treated to 30 Gy in 10 fractions.  ________________________________  Sheral Apley Tammi Klippel, M.D.

## 2021-10-24 NOTE — Progress Notes (Signed)
Speech Language Pathology Discharge Summary  Patient Details  Name: Jason [name] MRN: [id number] Date of Birth: 1956/01/28  Date of Discharge from [name] service:October 24, 2021  Today's Date: 10/24/2021 [name] Individual Time: 4944-7395 [name] Individual Time Calculation (min): 45 min  Skilled Therapeutic Interventions: Skilled ST treatment focused on dysphagia goals. Pt seen for consumption of morning meal. Pt consumed regular textures with timely and effective mastication, no oral residuals post swallows, and without overt s/sx of aspiration. Pt consumed meal with mod I for implementation of swallowing precautions. Pt requested to put syrup on sausage for added moisture. Pt consumed thin liquids without overt s/sx of aspiration and clear vocal quality post swallows. Pt performed self feeding with intermittent assist to scoop food onto spoon with red foam and required increased support toward end of meal d/t fatigue which did not appear to impede swallow function. [name] recommends continuation of regular textures with thin liquids, pills whole with water, and supervision for feeding assist. Continue general aspiration precautions. Both pt and spouse verbalized agreement. ST to sign off at this time. Follow up services are not indicated. Patient was left in bed with alarm activated and immediate needs within reach at end of session.   Patient has met   of   long term goals.  Patient to discharge at overall   level.  Reasons goals not met: NA   Clinical Impression/Discharge Summary: Patient has made functional gains and has met 1 of 1 long-term goals this admission due to improved oropharyngeal swallow resulting in improved diet tolerance. Pt is currently consuming regular textures with thin liquids with mod I-to-sup A to implement safe swallowing precautions and optimal positioning. All goals met and follow up services are not clinically indicated at this time. Recommend continuation of regular textures and  thin liquids at discharge.   Care Partner:        Recommendation:         Equipment:     Reasons for discharge:          Patty Sermons 10/24/2021, 3:47 PM

## 2021-10-24 NOTE — Progress Notes (Signed)
[place] for Lovenox Indication: DVT  No Known Allergies  Patient Measurements: Height: '5\' 9"'$  (175.3 cm) Weight: 99.9 kg (220 lb 3.8 oz) IBW/kg (Calculated) : 70.7 Heparin Dosing Weight:   Vital Signs: Temp: 97.9 F (36.6 C) (08/31 0434) BP: 127/68 (08/31 0434) Pulse Rate: 73 (08/31 0434)  Labs: Recent Labs    10/24/21 0506  HGB 9.1*  HCT 27.0*  PLT 325     Estimated Creatinine Clearance: 110.2 mL/min (A) (by C-G formula based on SCr of 0.51 mg/dL (L)).  Assessment: Pt has been on a modified dose of Lovenox but doppler showed new DVT.  Continues on full dose Lovenox   Scr <1 Hgb  9.1, plt wnl  Goal of Therapy:  Anti-Xa level 0.6-1 units/ml 4hrs after LMWH dose given Monitor platelets by anticoagulation protocol: Yes   Plan:  Lovenox '100mg'$  SQ BID Watch CBC  Thank you Anette Guarneri, PharmD 10/24/2021 8:00 AM

## 2021-10-24 NOTE — Progress Notes (Signed)
Introduced myself to the patient, and his wife, as the prostate nurse navigator.  He is here to discuss his radiation treatment options, and proceed with CT Simulation to anticipate palliative radiation to cervical spine to start on 11/05/2021.  Pt's wife concerned with upcoming appointments that are scheduled with medical oncology ([place]), and [facility] PET while he will be inpatient.  RN assured I would assist in coordinating getting these changed as needed and will be in contact. Patient concerns for next steps after radiation, RN educated that upon discharge inpatient team will provide referrals for needs.  I gave them my business card and asked him to call me with questions or concerns.  Verbalized understanding. RN will follow to ensure navigation needs are met.

## 2021-10-24 NOTE — Progress Notes (Signed)
Physical Therapy Session Note  Patient Details  Name: Jason [name] MRN: [id number] Date of Birth: May 31, 1955  Today's Date: 10/24/2021 PT Individual Time: 1121-1201 PT Individual Time Calculation (min): 40 min   Short Term Goals: Week 1:  PT Short Term Goal 1 (Week 1): pt will perform slideboard transfer with assist PT Short Term Goal 2 (Week 1): Pt with sit EOB with max A or better PT Short Term Goal 3 (Week 1): Pt will tolerate sitting up in w/c between therapy sessions.  Skilled Therapeutic Interventions/Progress Updates:    Pt received sitting in his TIS wheelchair with his wife present and pt agreeable to therapy session. Pt requesting to return to bed at end of session due to plan for him to be transported for a consultation this afternoon. Transported to/from gym in [employer] w/c.  R lateral scoot transfer TIS w/c>EOM with +2 min assist for trunk control while therapist provided dependent assist R LE management and transfer board placement - therapist provides total assist for B LE positioning/placement, trunk control, and scooting to complete transfer - cuing throughout for B UE positioning to allow pt to assist with pushing/pulling to scoot hips along the board. Pt requires dependent assist trunk control throughout all transfers.  Pt participated in sitting EOM tasks for ~10-35mnutes working on trunk control, core strengthening, and sitting balance - therapist donned 2lb weight on R UE and had +2 providing theraball support behind pt for supported seated rest breaks - preformed slightly reclined (<10degrees) "sit-ups" with pt using his B UEs to "throw" them forward to bring trunk forward off back support and then transition his hands to his knees to stop his anterior progression and prevent anterior LOB - requires mod assist for dynamic balance but dependent assist to prevent LOB once it occurs.  L lateral scoot transfer EOM>TIS w/c with assist as described above and +2 providing max  assist for trunk control while pt performing R lateral trunk lean onto R forearm support while therapist provides dependent assist L LE management and transfer board placement - total assist for completing transfer as described above.   Transported back to room. L lateral scoot as described above TIS w/c>EOB. Sit>supine with +2 total assist for trunk descent and B LE management into bed.   Performed supine unilateral UE elbow extension against gravity (punching up towards ceiling) x5 reps each UE requiring light min assist on R and max assist for movement on L.  Pt repositioned upright in the bed with pillows provided for B UE and B LE support for positioning and pressure relief. Pt left with needs in reach and his wife present.   Therapy Documentation Precautions:  Precautions Precautions: Fall, Cervical Precaution Comments: C6 laminectomy, MAP >65 Restrictions Weight Bearing Restrictions: Yes   Pain:  Pt made 1x comment about a second of discomfort in L shoulder but otherwise no complaints of pain during session.   Therapy/Group: Individual Therapy  CTawana Scale, PT, DPT, NCS, CSRS 10/24/2021, 7:50 AM

## 2021-10-25 MED ORDER — CLONAZEPAM 0.5 MG PO TABS
1.0000 mg | ORAL_TABLET | Freq: Every day | ORAL | Status: DC | PRN
  Administered 2021-10-31 – 2021-11-12 (×9): 1 mg via ORAL
  Filled 2021-10-25 (×11): qty 2

## 2021-10-25 NOTE — Progress Notes (Signed)
Occupational Therapy Weekly Progress Note  Patient Details  Name: Jason [name] MRN: [id number] Date of Birth: Mar 31, 1955  Beginning of progress report period: October 18, 2021 End of progress report period: October 25, 2021  Today's Date: 10/25/2021 OT Individual Time: 1100-1200 OT Individual Time Calculation (min): 60 min    Patient has met 3 of 4 short term goals.    Patient continues to demonstrate the following deficits: muscle weakness and muscle joint tightness, impaired timing and sequencing, abnormal tone, unbalanced muscle activation, motor apraxia, ataxia, decreased coordination, and decreased motor planning, decreased sitting balance, decreased postural control, and decreased balance strategies and therefore will continue to benefit from skilled OT intervention to enhance overall performance with BADL and Reduce care partner burden.  Patient progressing toward long term goals..  Continue plan of care.  OT Short Term Goals Week 1:  OT Short Term Goal 1 (Week 1): Pt will perform simple self feeding with mod A with AE and compensatory set up OT Short Term Goal 1 - Progress (Week 1): Met OT Short Term Goal 2 (Week 1): Pt will complete UB bathing supported sitting at sink wiht mod A OT Short Term Goal 2 - Progress (Week 1): Met OT Short Term Goal 3 (Week 1): Pt will complete pull over shirt donning with adapted strategies with mod A OT Short Term Goal 3 - Progress (Week 1): Met OT Short Term Goal 4 (Week 1): Pt will perform sitting EOB with mod A for UE simple functional or reaching task OT Short Term Goal 4 - Progress (Week 1): Progressing toward goal Week 2:  OT Short Term Goal 1 (Week 2): Patient will complete self feeding task with set-up using AE for at least 20 minutes before requiring additional assistance from spouse/rehab staff to complete. OT Short Term Goal 2 (Week 2): Pt will perform sitting EOB with min A for RUE simple functional or reaching task  Skilled  Therapeutic Interventions/Progress Updates:    Patient agreeable to participate in OT session. Reports 0/10 pain level. Wife present in room during therapy session.   Assessed status of kinsiotape on LUE with mild tape rolling noted. Rolled tape edges were trimmed. Patient verbalized benefit of kinsiotape with supporting Left shoulder subluxation.  Patient participated in skilled OT session focusing on reviewing week 1 OT goals and assessing pt's current progress and areas of needed focus for therapy. Due to pt's poor sitting posture due to decrease core strength and stability, forward head, rounded shoulders, and posterior pelvic tilt, adjustments were made to back rest of w/c. Beck rest moved forward and towel roll placed to provided lumbar support and encourage natural curvature of lumbar spine to decrease kyphosis. Left arm rest lower one notch to decrease amount of left scapular elevation. Bath towel roll placed on vertically to provide right lateral support and decrease amount of lateral lean to right side. Head rest adjusted to allow neck to achieve cervical retraction and reduce amount of cervical tension on posterior neck.  Discussed self feeding deficits and progress with pt report of using primarily spoons to complete self feeding in order to scoop food. Educated patient on progressing to use of fork with simulated self feeding task completed using putty. Pt was able to hold fork with built up foam hand with a functional modified tripod grasp and stab each piece of putty successfully.   Pt continues to demonstrate progress towards therapy goals. Recommend working on sitting balance progressing towards needing the least amount of physical assist  to maintain and increasing sitting posture by decreasing rounded shoulders and increasing scapular strength with special attention on proper positioning when sitting in w/c.     Therapy Documentation Precautions:  Precautions Precautions: Fall,  Cervical Precaution Comments: C6 laminectomy, MAP >65 Restrictions Weight Bearing Restrictions: No RUE Weight Bearing: Non weight bearing  Therapy/Group: Individual Therapy  Ailene Ravel, OTR/L,CBIS  Supplemental OT - [place] and [facility]   10/25/2021, 8:02 AM

## 2021-10-25 NOTE — Progress Notes (Signed)
Physical Therapy Session Note  Patient Details  Name: Jason [name] MRN: [id number] Date of Birth: 02-Apr-1955  Today's Date: 10/25/2021 PT Co-Treatment Time: 0902-0930 PT Co-Treatment Time Calculation (min): 28 min  Short Term Goals: Week 1:  PT Short Term Goal 1 (Week 1): pt will perform slideboard transfer with assist PT Short Term Goal 2 (Week 1): Pt with sit EOB with max A or better PT Short Term Goal 3 (Week 1): Pt will tolerate sitting up in w/c between therapy sessions.   Skilled Therapeutic Interventions/Progress Updates:  Patient supine in bed on entrance to room. Wife present and donning TED hose to RLE. Patient alert and agreeable to PT session. PT dons TED hose to LLE with maxA.   Patient with no pain complaint at start of session.  Therapeutic Activity: Bed Mobility: Pt performed roll to R and L sides of bed with overall MaxA/ TotA. Requires TotA for BLE positioning and movement, Pt able to initiate movement with BUE, but requires Max/TotA with shoulder support to initiate and reach LUE to handrail, and MaxA to reach RUE to handrail. Pericare and brief change performed for BM requiring TotA for both. Donned shorts in supine with pt able to slightly lift LLE to thread leg into pant leg.  Supine --> sit using log roll technique with positioning of RLE over LLE for roll of hips into sidelying and Rue reaching for bedrail. Requires MaxA to reach seated position with lean to L . NMR initiated for attaining seated balance. Pt with LOB posteriorly, anteriorly and to each side with ModA +2 and vc to correct. Wife assists with doffing then donning shirt overhead with vc from therapist for [place].  Transfers: Pt performed slide board transfers requiring TotA for LE positioning and slide board positioning. MaxA +2 to complete bed-->w/c and w/c --> mat table. Pt able to follow instructions for lean with Mod/ maxA to initiate and MaxA to complete. Provided verbal cues for direction of  lean, timing of effort, hand positioning, BLE positioning.   At end of session, pt demos ability to extend knee of LLE with 3-/ 5 strength and raise L knee in march with 3-/ 5 strength while seated in w/c.   Neuromuscular Re-ed: NMR facilitated during session with focus on sitting balance, trunk control, midline orientation, proprioception. Pt guided in activities while seated EOB for pt recognition of orientation of UB, use of head/ neck/ shoulders for return posteriorly, repositioning of hands R >L for assist in returning balance anteriorly.  NMR performed for improvements in motor control and coordination, balance, sequencing, judgement, and self confidence/ efficacy in performing all aspects of mobility at highest level of independence.   Patient seated upright in TIS with slight tilt back at time of "hand off" to PTA, no alarm set, and all needs within reach. Providing Co-tx with PTA for rest of session.    Therapy Documentation Precautions:  Precautions Precautions: Fall, Cervical Precaution Comments: C6 laminectomy, MAP >65 Restrictions Weight Bearing Restrictions: No RUE Weight Bearing: Non weight bearing General:   Vital Signs: Therapy Vitals Temp: 97.9 F (36.6 C) Pulse Rate: 70 Resp: 16 BP: 131/73 Oxygen Therapy SpO2: 97 % O2 Device: Room Air Pain: Pain Assessment Pain Scale: 0-10 Pain Score: 0-No pain   Therapy/Group: Individual Therapy  Alger Simons PT, DPT, CSRS 10/25/2021, 9:03 AM

## 2021-10-25 NOTE — Progress Notes (Signed)
Bowel program initiated at 1820 with 10 seconds of dig stim small type 6 bowel movement expressed with dig stim bisacodyl suppository inserted at 1822 will notify oncoming nurse of incomplete bowel program.

## 2021-10-25 NOTE — Progress Notes (Signed)
Dig stim completed this shift. Patient with medium amount of loose stool already in brief and medium amount of stool removed post dig stim. Patient repositioned with pillow support and PRAFO boots in place. Foley care completed. Urine noted to be dark tea colored. Continue with plan of care.

## 2021-10-25 NOTE — Progress Notes (Addendum)
Physical Therapy Session Note  Patient Details  Name: Jason [name] MRN: [id number] Date of Birth: Mar 17, 1955  Today's Date: 10/25/2021 PT Co-Treatment Time: 0900-1000 (billed for 9:30-10:00 PT Co-Treatment Time Calculation (min): 60 min  Short Term Goals: Week 1:  PT Short Term Goal 1 (Week 1): pt will perform slideboard transfer with assist PT Short Term Goal 2 (Week 1): Pt with sit EOB with max A or better PT Short Term Goal 3 (Week 1): Pt will tolerate sitting up in w/c between therapy sessions.  Skilled Therapeutic Interventions/Progress Updates: Pt participated in co-tx with PT for increased skilled intervention. Pt presented in bed agreeable to therapy with wife present. Pt noted to be incontinent of bowels. Pt able to perform rolling L/R with maxA to allow for peri-care. Shorts threaded total A and performed additional rolling in same manner to pull pants over hips. Performed supine to sit with maxA and required maxA for sitting balance, however pt able to perform small lateral leans and small corrections to work towards midline. Pt with poor trunk activation to stop leans once initiated requiring maxA to sustain. Slide board placed total A and perform Slide board transfer maxA 2 fading to 1 to TIS. Pt transferred to day room in TIS and performed Slide board transfer maxA to high/low mat. Pt required maxA but was able to initiate R lateral lean to remove slide board and was also able to initiate pushing back up to neutral. Pt participated in sitting balance and core activation activities. Pt participated in trunk flexion with maxA but pt able to use BUE to assist in pulling self forward. Pt unable to stop momentum requiring assist from PT to stop. Pt was able to initiate leaning backwards using BUE with PTA behind pt to prevent posterior LOB. By third attempt pt was able to activate core ms and slightly lift trunk off PTA's support. Pt with noted fatigue and was transferred back to TIS via  slideboard with maxA 1-2 (+2 present for safety). Pt returned to room and remained in TIS at end of session with belt alarm on, call bell within reach and needs met.      Therapy Documentation Precautions:  Precautions Precautions: Fall, Cervical Precaution Comments: C6 laminectomy, MAP >65 Restrictions Weight Bearing Restrictions: No RUE Weight Bearing: Non weight bearing General:   Vital Signs:   Pain:   Mobility:   Locomotion :    Trunk/Postural Assessment :    Balance:   Exercises:   Other Treatments:      Therapy/Group: Individual Therapy  Beauden Tremont 10/25/2021, 12:55 PM

## 2021-10-25 NOTE — Progress Notes (Signed)
Physical Therapy Session Note  Patient Details  Name: Jason [name] MRN: [id number] Date of Birth: [birth year]  Today's Date: 10/25/2021 PT Individual Time: 1418-1530 PT Individual Time Calculation (min): 72 min   Short Term Goals: Week 1:  PT Short Term Goal 1 (Week 1): pt will perform slideboard transfer with assist PT Short Term Goal 2 (Week 1): Pt with sit EOB with max A or better PT Short Term Goal 3 (Week 1): Pt will tolerate sitting up in w/c between therapy sessions.  Skilled Therapeutic Interventions/Progress Updates: Pt presented in TIS agreeable to therapy. Pt denies pain and agreeable to return to bed at end of session. Session focused on sitting balance progression of functional transfers. Pt with dependent transfer to rehab gym and pt set up with slideboard for transfer. Required assist for increased lateral lean and pt performed Slide board transfer to high/low mat to R with maxAx 1 and additional tech present for safety. Pt participated in achieving midline and attempting to maintain. Pt required modA to correct to more neutral position as pt assisted using BUE to pull on mat. Pt was able to brief attain midline with only CGA however unable to sustain for any significant amount of time. Pt then participated in reaching tasks with modA with pt placing BUE on legs then attempting to reach/tap bean bags on tray with single RUE. Pt able to reach and tap with minimal challenges (directly in front) with pt demonstrating some initiation from core ms. Pt also participated in ball taps using RUE as pt was unable to extend/flex LUE. Pt performed 2 x 15 ball taps and was able to engage core enough when reaching that he was able to move away from back support (ball) and sustain briefly. Participated in AA knee flexion/extension with ball support under foot x 3 then required total A from PTA for last 2 due to fatigue. Pt was able to perform x 5 with LLE without assist. Pt transferred back to TIS  requiring maxA for lean to R and maxA x 2 fading to maxA x 1 to return to w/c. Pt transported back to room and set up in same manner however required maxA x 2 to return to bed and maxA x 2 for sit to supine. Pt repositioned for comfort at end of session and PTA obtained new cushion per discussion with OT to trial a new cushion to decrease possible sacral sit. Pt left in bed at end of session with bed alarm on, call bell within reach and wife present propping BLE to offload heels.      Therapy Documentation Precautions:  Precautions Precautions: Fall, Cervical Precaution Comments: C6 laminectomy, MAP >65 Restrictions Weight Bearing Restrictions: No RUE Weight Bearing: Non weight bearing General:   Vital Signs: Therapy Vitals Temp: 98 F (36.7 C) Pulse Rate: 79 Resp: 18 BP: 132/77 Patient Position (if appropriate): Sitting Oxygen Therapy SpO2: 99 % O2 Device: Room Air Pain:   Mobility:   Locomotion :    Trunk/Postural Assessment :    Balance:   Exercises:   Other Treatments:      Therapy/Group: Individual Therapy  Daschel Roughton 10/25/2021, 4:17 PM

## 2021-10-25 NOTE — Progress Notes (Signed)
Physical Therapy Weekly Progress Note  Patient Details  Name: Jason [name] MRN: [id number] Date of Birth: 12/12/1955  Beginning of progress report period: October 18, 2021 End of progress report period: October 25, 2021  {CHL IP REHAB PT TIME CALCULATION:304800500}  Patient has met 3 of 3 short term goals.  Pt has shown slow but steady gains during current week of therapy. Pt is currently maxA x 1 for rolling and supine to sit at EOB. Pt is max to total A x 2 for sit to supine due to poor BLE strength, and truncal strength. Pt is intermittent maxA x 1 but primarily maxA x 2 for slide board transfers requires total A for Slide board set up and can tolerate sitting in TIS for up to 6 hours. Therapy has been working consistently with sitting balance and has been working of achieving static balance which can fluctuate between CGA to [place].  Pt is currently maxA for dynamic sitting balance as pt unable to stop LOB due to momentum.   Patient continues to demonstrate the following deficits muscle weakness, muscle joint tightness, and muscle paralysis and impaired timing and sequencing, abnormal tone, and unbalanced muscle activation and therefore will continue to benefit from skilled PT intervention to increase functional independence with mobility.  Patient {LTG progression:3041653}.  {plan of LIDC:3013143}  PT Short Term Goals Week 1:  PT Short Term Goal 1 (Week 1): pt will perform slideboard transfer with assist PT Short Term Goal 1 - Progress (Week 1): Met PT Short Term Goal 2 (Week 1): Pt with sit EOB with max A or better PT Short Term Goal 2 - Progress (Week 1): Met PT Short Term Goal 3 (Week 1): Pt will tolerate sitting up in w/c between therapy sessions. PT Short Term Goal 3 - Progress (Week 1): Met Week 2:     Skilled Therapeutic Interventions/Progress Updates:      Therapy Documentation Precautions:  Precautions Precautions: Fall, Cervical Precaution Comments: C6 laminectomy,  MAP >65 Restrictions Weight Bearing Restrictions: No RUE Weight Bearing: Non weight bearing General:   Vital Signs: Therapy Vitals Temp: 98 F (36.7 C) Pulse Rate: 79 Resp: 18 BP: 132/77 Patient Position (if appropriate): Sitting Oxygen Therapy SpO2: 99 % O2 Device: Room Air Pain:   Vision/Perception     Mobility:   Locomotion :    Trunk/Postural Assessment :    Balance:   Exercises:   Other Treatments:     Therapy/Group: Individual Therapy  Jason [name] 10/25/2021, 4:36 PM

## 2021-10-25 NOTE — Progress Notes (Signed)
Patient ID: Jason [name], male   DOB: 1955/03/01, 66 y.o.   MRN: [id number]  Per EMR, pt will begin radiation treatment on 9/13 through 9/22; port placement and films on 9/12.   Pt scheduled with [facility] for transportation on:  9/12- pick up at 3pm appt for films and port. 9/13-9/25 -pick up at 2:30pm for 3pm appt for tx.  Loralee Pacas, MSW, [place] Office: [phone] Cell: [phone] Fax: [phone]

## 2021-10-26 NOTE — Progress Notes (Signed)
Physical Therapy Session Note  Patient Details  Name: Jason [name] MRN: [id number] Date of Birth: Nov 07, 1955  Today's Date: 10/26/2021 PT Individual Time: 2376-2831 PT Individual Time Calculation (min): 73 min   Short Term Goals: Week 1:  PT Short Term Goal 1 (Week 1): pt will perform slideboard transfer with assist PT Short Term Goal 1 - Progress (Week 1): Met PT Short Term Goal 2 (Week 1): Pt with sit EOB with max A or better PT Short Term Goal 2 - Progress (Week 1): Met PT Short Term Goal 3 (Week 1): Pt will tolerate sitting up in w/c between therapy sessions. PT Short Term Goal 3 - Progress (Week 1): Met  Skilled Therapeutic Interventions/Progress Updates:    Pt received supine in bed with HOB elevated and his wife, [name], present and pt agreeable to therapy session with plan to utilize the tilt table to promote B LE WBing for NMR to increased muscle activation and for core strengthening/control. Pt already wearing B LE thigh high TED hose.   Vitals in bed to start session: BP 141/78 (MAP 96), HR 81bpm   Supine>R sidelying with total assist for trunk/pelvic rotation and L LE positioning into hooklying (pt able to slightly move towards hip/knee flexion against gravity) and utilizing L HHA to assist with trunk rotation - pt unable to actively reach L UE fuly across his body and grab the bedrail due to L shoulder subluxation and decreased strength.  Supine>L sidelying with total assist for trunk/pelvic rotation and for positioning R LE into hooklying position and pt able to better initiate bringing R UE across his body to use the bedrail to perform trunk rotation.   Therapist placed a sheet underneath pt to perform dependent supine transfer from bed to tilt table with 4 person assist for safety.   Vitals flat on tilt table at beginning: BP 148/82 (MAP 100), HR 81bpm   Progressed to 20 degrees elevation. Reassessed vitals: BP 149/85 (MAP 105), HR 86bpm   Progressed to 40 degrees.  Vitals: BP 150/88 (MAP 105), HR 88bpm   Progressed to 50 degrees. Vitals: BP 139/90 (MAP 105), HR 95bpm  Pt with no symptoms indicating change in vitals throughout session.  Performed 2x10reps of mini-squats with knee strap loosened to allow ~20degrees of knee flexion - pt able to actively extend L knee fully back into the mat table and is able to achieve a visible muscle contraction in R quad with 1x able to initiate active movement back towards the mat through limited range.   Throughout supported positioning on tilt table pt continues to demonstrates a R lateral trunk flexion/lean - therapist positioned pillows on pt's R side and assisted him to maintain improved midline orientation.  Performed attempts at cross body R UE "high-fives" to work towards L lateral trunk flexion and rotation for core strengthening.   Performed reclined "sit-ups" with beach ball toss x10 reps with goal of bringing his shoulders forward off of the mat.   Vitals after 95mnutes at 50 degrees: BP 133/88 (MAP 102), HR 95bpm   Progressed to 60 degrees upright for only 335mutes due to pt not able to maintain trunk extension to keep himself upright requiring mod/max assist to extend his shoulders back into the mat.   Pt tolerated the tilt table for a total of ~3075mtes.   Pt's vitals once returning flat on tilt table: BP 130/76 (MAP 92), HR 76bpm   Dependent supine transfer back to EOB as described above.   Therapist assisted  with repositioning pt supine in bed with B UEs and B LEs elevated and hips semi-sidelying towards R for pressure relief. Pt left with needs in reach and his wife present.   Therapy Documentation Precautions:  Precautions Precautions: Fall, Cervical Precaution Comments: C6 laminectomy, MAP >65 Restrictions Weight Bearing Restrictions: No RUE Weight Bearing: Non weight bearing  Pain: No reports of pain throughout session.    Therapy/Group: Individual Therapy  Tawana Scale , PT,  DPT, NCS, CSRS 10/26/2021, 12:17 PM

## 2021-10-26 NOTE — Progress Notes (Signed)
PROGRESS NOTE   Subjective/Complaints:  Pt reports  some reddish urine/tea colored- nursing thinks was yanked accidentally yesterday.   About to get breathing treatment- Slept- pretty good.   ROS:   Pt denies SOB, abd pain, CP, N/V/C/D, and vision changes   Except for HPI  Objective:   No results found.  Recent Labs    10/24/21 0506  WBC 9.5  HGB 9.1*  HCT 27.0*  PLT 325   No results for input(s): "NA", "K", "CL", "CO2", "GLUCOSE", "BUN", "CREATININE", "CALCIUM" in the last 72 hours.   Intake/Output Summary (Last 24 hours) at 10/26/2021 1104 Last data filed at 10/26/2021 0950 Gross per 24 hour  Intake 234 ml  Output 3625 ml  Net -3391 ml        Physical Exam: Vital Signs Blood pressure 138/72, pulse 71, temperature 98.2 F (36.8 C), temperature source Oral, resp. rate 16, height _0  (1.753 m), weight 99.9 kg, SpO2 96 %.        General: awake, alert, appropriate, sitting up in bed; RT in room; NAD HENT: conjugate gaze; oropharynx moist CV: regular rate; no JVD Pulmonary: CTA B/L; no W/R/R- good air movement GI: soft, NT, ND, (+)BS Psychiatric: appropriate Neurological: Ox3- almost constant spasms of RLE Genitourinary: Reddish/dark reddish colored urine- see through Musculoskeletal: L shoulder K tape in place- still subluxed    Cervical back: Neck supple. Tenderness present.     Comments: RUE- biceps 5/5; WE 4-/5; Triceps 2+/5; Grip 3-/5; FA 2-/5 LUE- biceps 5-/5; WE 4/5; Triceps 2+/5' Grip 3+/5; FA 2-/5 RLE- 0/5 in RLE LLE- HF 1/5; KE 2-/5; DF 1/5; PF 2/5; EHL 1/5   Skin:    General: Skin is warm and dry.  Neurological:     Mental Status: He is alert.     Comments: Soft voice. Decreased postural control --listing to the right and neck with decreased ROM. Able to passively get to midline without discomfort.    Decreased to light touch at L1 down to S1 B/L- unable to check S2-S5  today Almost constant spasms of RLE during exam; a few spasms seen on LLE Clonus sustained on RLE; and a few beats on LLE Hoffman's (+) B/L Ue's;  Ox3  Assessment/Plan: 1. Functional deficits which require 3+ hours per day of interdisciplinary therapy in a comprehensive inpatient rehab setting. Physiatrist is providing close team supervision and 24 hour management of active medical problems listed below. Physiatrist and rehab team continue to assess barriers to discharge/monitor patient progress toward functional and medical goals  Care Tool:  Bathing    Body parts bathed by patient: Face   Body parts bathed by helper: Right arm, Left arm, Chest, Abdomen, Front perineal area, Buttocks, Left lower leg, Right lower leg, Left upper leg, Right upper leg     Bathing assist Assist Level: Dependent - Patient 0%     Upper Body Dressing/Undressing Upper body dressing   What is the patient wearing?: Pull over shirt    Upper body assist Assist Level: Maximal Assistance - Patient 25 - 49%    Lower Body Dressing/Undressing Lower body dressing      What is the patient wearing?: Pants  Lower body assist Assist for lower body dressing: Total Assistance - Patient < 25%     Toileting Toileting    Toileting assist Assist for toileting: Dependent - Patient 0%     Transfers Chair/bed transfer  Transfers assist  Chair/bed transfer activity did not occur: Safety/medical concerns  Chair/bed transfer assist level: 2 Helpers (slideboard)     Locomotion Ambulation   Ambulation assist   Ambulation activity did not occur: Safety/medical concerns          Walk 10 feet activity   Assist  Walk 10 feet activity did not occur: Safety/medical concerns        Walk 50 feet activity   Assist Walk 50 feet with 2 turns activity did not occur: Safety/medical concerns         Walk 150 feet activity   Assist Walk 150 feet activity did not occur: Safety/medical  concerns         Walk 10 feet on uneven surface  activity   Assist Walk 10 feet on uneven surfaces activity did not occur: Safety/medical concerns         Wheelchair     Assist Is the patient using a wheelchair?: Yes (Pt will use w/c but was not able to get up to chair today) Type of Wheelchair: Manual Wheelchair activity did not occur: Safety/medical concerns         Wheelchair 50 feet with 2 turns activity    Assist    Wheelchair 50 feet with 2 turns activity did not occur: Safety/medical concerns       Wheelchair 150 feet activity     Assist  Wheelchair 150 feet activity did not occur: Safety/medical concerns       Blood pressure 138/72, pulse 71, temperature 98.2 F (36.8 C), temperature source Oral, resp. rate 16, height _0  (1.753 m), weight 99.9 kg, SpO2 96 %.  Medical Problem List and Plan: 1. 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  Antithrombotics: -DVT/anticoagulation:  Pharmaceutical: Lovenox.               -antiplatelet therapy: ASA 3. Pain Management: Oxycodone prn. 8/28- pt reports no significant pain- and doesn't want spasticity meds at this time.   4. Mood/Behavior/Sleep: LCSW to follow for evaluation and support.              -antipsychotic agents: N/A 5. Neuropsych/cognition: This patient is capable of making decisions on his own behalf. 6. Skin/Wound Care: Routine pressure relief measures              --monitor incision for healing.  7. Fluids/Electrolytes/Nutrition: Monitor I/O. Check CMET in am.  8. Prostate AdenoCA w/mets: Loading dose Firmagon 240 mg 10/06/21 per Dr. Tasia Catchings --Charlesetta Garibaldi changed to [facility] due to insurance coverage--next dose around 09/13. --Simulation for XRT  08/31 at 2 pm [facility] 8/31- getting simulation for XRT and consult today 9/2- XRT to start 9/12 9. Dysphagia: Continue D3 w/chopped meats and assist at meals. Aspiration precautions.  10. Neurogenic bladder: continue foley for now  8/31- pt doesn't want to remove to  see if can void; or to do in/out caths- thinks  too much stress on family.  11. Neurogenic bowel: Will get KUB to determine stool burden --Colace in am with suppository after supper.  8/25- had good results with bowel program 8/27- good results with bowel program yesterday 8/28- having bowel accidents- and BM with bowel program last 3 nights-  8/29- 2 small BM's last night after bowel program- con't regimen 9/2- Having results with bowel program nightly 12. Sepsis/Likely aspiration PNA: Now on Augmentin--antibiotic D#5/10. BC pend-->neg so far.              --Follow up CXR in am.              --pulmonary hygiene for atelectasis/Asymmetric volume loss on right.  13. Abnormal LFTs: Improved overall except for elevation in Aphos--315. Will recheck in am  8/25- Alk phos up to 457; AST 62 and ALT 96- except for alk phos, they are heading downwards- con't to monitor weekly.  14. ABLA: Recheck in am  -stable 8.7 HGB 8/26,  Recheck tomorrow 8.31- Hb stable at 9.1 15. Leucocytosis: Recheck in am. Monitor for fevers/other signs of infection.    8/25- WBC 17.1k- on Augmentin- will recheck labs in AM  -WBC improved to 15.6 10/19/21  -Recheck CBC tomorrow  8/28- Down to 10.8k- con't to monitor  8/31- WBC 9.5k- doing better 16. Hypotension: Will continue midodrine for now.    8/25- BP better 120s/70s- con't regimen 17. DVT left gastrocnemius vein  -8/26 Noted Lovenox was increased yesterday 8/25 to 181m BID, Distal DVT, will recheck in 1 -2 weeks, compression stockings,   18. Spasticity  8/28- pt doesn't want spasticity meds- isn't painful or annoying.  1[place] 8/28- will d/c staples [facility] need to see about FMills Koller-supposed to be given 9/12- Oncology to handle it- L/M/via PA about this.  8/30- per pt, they aren't using Firmagen- are using other product- will d/w Oncology more 9/2- XRT starts  9/12- 9/26  Will have pt seen by W/C rep- and justify - will likely need power w/c with ROHO cushion- will add details as we know them    LOS: 9 days A FACE TO FACE EVALUATION WAS PERFORMED  Tia Gelb 10/26/2021, 11:04 AM

## 2021-10-26 NOTE — Progress Notes (Signed)
Bowel program done at [facility] [place].Dig stim done, pt had small bowel movement. Bisacodyl Suppository inserted at 1831. Will notifiy oncoming nurse of incomplete bowel program.

## 2021-10-26 NOTE — Progress Notes (Signed)
Pt informed of bowel program and agreeable to procedure. Patient requested to position on his R side due to L shoulder issue. Brief noted with small incontinent stool. Digital stimulation performed with moderate, soft stool output. Peri care done. Pt repositioned to comfort.

## 2021-10-27 LAB — CBC
HCT: 29.8 % — ABNORMAL LOW (ref 39.0–52.0)
Hemoglobin: 9.9 g/dL — ABNORMAL LOW (ref 13.0–17.0)
MCH: 32.5 pg (ref 26.0–34.0)
MCHC: 33.2 g/dL (ref 30.0–36.0)
MCV: 97.7 fL (ref 80.0–100.0)
Platelets: 285 K/uL (ref 150–400)
RBC: 3.05 MIL/uL — ABNORMAL LOW (ref 4.22–5.81)
RDW: 17.2 % — ABNORMAL HIGH (ref 11.5–15.5)
WBC: 7.9 K/uL (ref 4.0–10.5)
nRBC: 0.3 % — ABNORMAL HIGH (ref 0.0–0.2)

## 2021-10-27 NOTE — Progress Notes (Signed)
PROGRESS NOTE   Subjective/Complaints:  Food buildup on 1 tooth- gingivitis Pain barely  there with pain meds.  "Rocked the tilt table" Wife to go get water pick  Didn't sleep well due to shoulder pain, but better this AM.   ROS:   Pt denies SOB, abd pain, CP, N/V/C/D, and vision changes   Except for HPI  Objective:   No results found.  Recent Labs    10/27/21 0638  WBC 7.9  HGB 9.9*  HCT 29.8*  PLT 285   No results for input(s): "NA", "K", "CL", "CO2", "GLUCOSE", "BUN", "CREATININE", "CALCIUM" in the last 72 hours.   Intake/Output Summary (Last 24 hours) at 10/27/2021 1450 Last data filed at 10/27/2021 1304 Gross per 24 hour  Intake 780 ml  Output 1950 ml  Net -1170 ml        Physical Exam: Vital Signs Blood pressure 124/66, pulse 75, temperature 98 F (36.7 C), resp. rate 17, height '5\' 9"'  (1.753 m), weight 99.9 kg, SpO2 100 %.         General: awake, alert, appropriate, sitting up- wife and reps therapy at bedside; NAD HENT: conjugate gaze; oropharynx moist- gingivitis R bottom teeth CV: regular rate; no JVD Pulmonary: CTA B/L; no W/R/R- good air movement- intermittent coughing GI: soft, NT, ND, (+)BS Psychiatric: appropriate Neurological: Ox3- spasms RLE Genitourinary: Reddish/dark reddish colored urine- see through- is more brown than red today Musculoskeletal: L shoulder K tape in place- still subluxed    Cervical back: Neck supple. Tenderness present.     Comments: RUE- biceps 5/5; WE 4-/5; Triceps 2+/5; Grip 3-/5; FA 2-/5 LUE- biceps 5-/5; WE 4/5; Triceps 2+/5' Grip 3+/5; FA 2-/5 RLE- 0/5 in RLE LLE- HF 1/5; KE 2-/5; DF 1/5; PF 2/5; EHL 1/5   Skin:    General: Skin is warm and dry.  Neurological:     Mental Status: He is alert.     Comments: Soft voice. Decreased postural control --listing to the right and neck with decreased ROM. Able to passively get to midline without  discomfort.    Decreased to light touch at L1 down to S1 B/L- unable to check S2-S5 today Almost constant spasms of RLE during exam; a few spasms seen on LLE Clonus sustained on RLE; and a few beats on LLE Hoffman's (+) B/L Ue's;  Ox3  Assessment/Plan: 1. Functional deficits which require 3+ hours per day of interdisciplinary therapy in a comprehensive inpatient rehab setting. Physiatrist is providing close team supervision and 24 hour management of active medical problems listed below. Physiatrist and rehab team continue to assess barriers to discharge/monitor patient progress toward functional and medical goals  Care Tool:  Bathing    Body parts bathed by patient: Face   Body parts bathed by helper: Right arm, Left arm, Chest, Abdomen, Front perineal area, Buttocks, Left lower leg, Right lower leg, Left upper leg, Right upper leg     Bathing assist Assist Level: Dependent - Patient 0%     Upper Body Dressing/Undressing Upper body dressing   What is the patient wearing?: Pull over shirt    Upper body assist Assist Level: Maximal Assistance - Patient 25 - 49%  Lower Body Dressing/Undressing Lower body dressing      What is the patient wearing?: Pants     Lower body assist Assist for lower body dressing: Total Assistance - Patient < 25%     Toileting Toileting    Toileting assist Assist for toileting: Dependent - Patient 0%     Transfers Chair/bed transfer  Transfers assist  Chair/bed transfer activity did not occur: Safety/medical concerns  Chair/bed transfer assist level: 2 Helpers     Locomotion Ambulation   Ambulation assist   Ambulation activity did not occur: Safety/medical concerns          Walk 10 feet activity   Assist  Walk 10 feet activity did not occur: Safety/medical concerns        Walk 50 feet activity   Assist Walk 50 feet with 2 turns activity did not occur: Safety/medical concerns         Walk 150 feet  activity   Assist Walk 150 feet activity did not occur: Safety/medical concerns         Walk 10 feet on uneven surface  activity   Assist Walk 10 feet on uneven surfaces activity did not occur: Safety/medical concerns         Wheelchair     Assist Is the patient using a wheelchair?: Yes (Pt will use w/c but was not able to get up to chair today) Type of Wheelchair: Manual Wheelchair activity did not occur: Safety/medical concerns         Wheelchair 50 feet with 2 turns activity    Assist    Wheelchair 50 feet with 2 turns activity did not occur: Safety/medical concerns       Wheelchair 150 feet activity     Assist  Wheelchair 150 feet activity did not occur: Safety/medical concerns       Blood pressure 124/66, pulse 75, temperature 98 F (36.7 C), resp. rate 17, height '5\' 9"'  (1.753 m), weight 99.9 kg, SpO2 100 %.  Medical Problem List and Plan: 1. 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  Antithrombotics: -DVT/anticoagulation:  Pharmaceutical: Lovenox.               -antiplatelet therapy: ASA 3. Pain Management: Oxycodone prn. 8/28- pt reports no significant pain- and doesn't want spasticity meds at this time. 9/3- pain controlled with meds- cont    4. Mood/Behavior/Sleep: LCSW to follow for evaluation and support.              -antipsychotic agents: N/A 5. Neuropsych/cognition: This patient is capable of making decisions on his own behalf. 6. Skin/Wound Care: Routine pressure relief measures              --monitor incision for healing.  7. Fluids/Electrolytes/Nutrition: Monitor I/O. Check CMET in am.  8. Prostate AdenoCA w/mets: Loading dose Firmagon 240 mg  10/06/21 per Dr. Tasia Catchings --Charlesetta Garibaldi changed to [facility] due to insurance coverage--next dose around 09/13. --Simulation for XRT 08/31 at 2 pm at [facility] 8/31- getting simulation for XRT and consult today 9/2- XRT to start 9/12 9. Dysphagia: Continue D3 w/chopped meats and assist  at meals. Aspiration precautions.  10. Neurogenic bladder: continue foley for now  8/31- pt doesn't want to remove to  see if can void; or to do in/out caths- thinks too much stress on family.   9/3- still has some brownish  urine- see through.  11. Neurogenic bowel: Will get KUB to determine stool burden --Colace in am with suppository after supper.  8/25- had good results with bowel program 8/27- good results with bowel program yesterday 8/28- having bowel accidents- and BM with bowel program last 3 nights-  8/29- 2 small BM's last night after bowel program- con't regimen 9/2- Having results with bowel program nightly 12. Sepsis/Likely aspiration PNA: Now on Augmentin--antibiotic D#5/10. BC pend-->neg so far.              --Follow up CXR in am.              --pulmonary hygiene for atelectasis/Asymmetric volume loss on right.  13. Abnormal LFTs: Improved overall except for elevation in Aphos--315. Will recheck in am  8/25- Alk phos up to 457; AST 62 and ALT 96- except for alk phos, they are heading downwards- con't to monitor weekly.  14. ABLA: Recheck in am  -stable 8.7 HGB 8/26,  Recheck tomorrow 8.31- Hb stable at 9.1 15. Leucocytosis: Recheck in am. Monitor for fevers/other signs of infection.    8/25- WBC 17.1k- on Augmentin- will recheck labs in AM  -WBC improved to 15.6 10/19/21  -Recheck CBC tomorrow  8/28- Down to 10.8k- con't to monitor  8/31- WBC 9.5k- doing better 16. Hypotension: Will continue midodrine for now.    8/25- BP better 120s/70s- con't regimen 17. DVT left gastrocnemius vein  -8/26 Noted Lovenox was increased yesterday 8/25 to 156m BID, Distal DVT, will recheck in 1 -2 weeks, compression  stockings,   18. Spasticity  8/28- pt doesn't want spasticity meds- isn't painful or annoying.  1[place] 8/28- will d[facility]c staples [facility] need to see about FMills Koller-supposed to be given 9/12- Oncology to handle it- L/M/via PA about this.  8/30- per pt, they aren't using Firmagen- are using other product- will d/w Oncology more 9/2- XRT starts  9/12- 9/26  Will have pt seen by W/C rep- and justify - will likely need power w/c with ROHO cushion- will add details as we know them   I spent a total of  36  minutes on total care today- >50% coordination of care- due to taught wife about quad coughing demonstrated and wife did as well.     LOS: 10 days A FACE TO FACE EVALUATION WAS PERFORMED  Aqsa Sensabaugh 10/27/2021, 2:50 PM

## 2021-10-27 NOTE — Progress Notes (Signed)
ANTICOAGULATION CONSULT NOTE-Follow Up Pharmacy Consult for Lovenox Indication: DVT  No Known Allergies  Patient Measurements: Height: '5\' 9"'$  (175.3 cm) Weight: 99.9 kg (220 lb 3.8 oz) IBW/kg (Calculated) : 70.7 Heparin Dosing Weight:   Vital Signs: Temp: 98.2 F (36.8 C) (09/03 0454) Temp Source: Oral (09/03 0454) BP: 129/67 (09/03 0457) Pulse Rate: 70 (09/03 0457)  Labs: Recent Labs    10/27/21 0638  HGB 9.9*  HCT 29.8*  PLT 285     Estimated Creatinine Clearance: 110.2 mL/min (A) (by C-G formula based on SCr of 0.51 mg/dL (L)).  Assessment: Pt has been on a modified dose of Lovenox but doppler showed new DVT.  Continues on full dose Lovenox   Scr <1 Hgb  9.9, plts 295  Goal of Therapy:  Anti-Xa level 0.6-1 units/ml 4hrs after LMWH dose given Monitor platelets by anticoagulation protocol: Yes   Plan:  Lovenox '100mg'$  SQ BID Monitor CBC  Thank you Sandford Craze, PharmD. [facility] Acute Care PGY-1 10/27/2021 9:17 AM

## 2021-10-27 NOTE — Progress Notes (Signed)
Physical Therapy Session Note  Patient Details  Name: EVARISTO TSUDA MRN: [id number] Date of Birth: 09/20/1955  Today's Date: 10/27/2021 PT Individual Time: 3567-0141 PT Individual Time Calculation (min): 73 min   Short Term Goals: Week 1:  PT Short Term Goal 1 (Week 1): pt will perform slideboard transfer with assist PT Short Term Goal 1 - Progress (Week 1): Met PT Short Term Goal 2 (Week 1): Pt with sit EOB with max A or better PT Short Term Goal 2 - Progress (Week 1): Met PT Short Term Goal 3 (Week 1): Pt will tolerate sitting up in w/c between therapy sessions. PT Short Term Goal 3 - Progress (Week 1): Met Week 2:     Skilled Therapeutic Interventions/Progress Updates:      Therapy Documentation Precautions:  Precautions Precautions: Fall, Cervical Precaution Comments: C6 laminectomy, MAP >65 Restrictions Weight Bearing Restrictions: No RUE Weight Bearing: Non weight bearing   Pt received seated in w/c at bedside and agreeable to PT session. Pt declines pain and highly motivated to participate in session. Pt dependently donned shoes and transported patient by w/c to dayroom. PT session with emphasis on head/trunk and postural control activities to improve static and dynamic sitting balance. Pt presents with right lateral lean and is able to self-correct with verbal cues. Pt initially requires mod A for forward lean and is able to maintain upright position without chair back support for 1 minute. Pt utilizes right UE for balance. Pt transitioned to contralateral reaching/punching with right UE to target 2 x 10. Pt educated on momentum and is able to appropriately demonstrate. Pt transported back to room by w/c and total A x 2 with slide board transfer to edge of bed. Pt requires max A x 2 with sit to supine. PT provided max A to transition from supine to left sidelying. Pt required mod A for R UE contralateral protraction across body in side lying position and pt performed 3 x 5. Pt  left semi-reclined in bed with spouse present, bed alarm on and all needs within reach.     Therapy/Group: Individual Therapy  Verl Dicker Verl Dicker PT, DPT  10/27/2021, 1:37 PM

## 2021-10-27 NOTE — Progress Notes (Signed)
Occupational Therapy Session Note  Patient Details  Name: Jason [name] MRN: [id number] Date of Birth: September 26, 1955  Today's Date: 10/27/2021 OT Individual Time: 1115-1200 OT Individual Time Calculation (min): 45 min    Short Term Goals: Week 2:  OT Short Term Goal 1 (Week 2): Patient will complete self feeding task with set-up using AE for at least 20 minutes before requiring additional assistance from spouse/rehab staff to complete. OT Short Term Goal 2 (Week 2): Pt will perform sitting EOB with min A for RUE simple functional or reaching task  Skilled Therapeutic Interventions/Progress Updates:    Pt resting in TIS w/c upon arrival with wife and friend present. OT intervention with focus on family educaiton, discharge planning, DME requirements, and activity tolerance. Pt with slight lean to Rt but able to self correct. DME recommendations include DABSC, padded tub bench with cutout, and hospital bed. Wife provided with home measurement sheet. Kinesio tape reapplied to pt's Lt shoulder. Pt remained seated in w/c with wife present. Belt alarm activated. All needs within reach.   Therapy Documentation Precautions:  Precautions Precautions: Fall, Cervical Precaution Comments: C6 laminectomy, MAP >65 Restrictions Weight Bearing Restrictions: No RUE Weight Bearing: Non weight bearing Pain: Pain Assessment Pain Scale: 0-10 Pain Score: 0-No pain  Therapy/Group: Individual Therapy  Leroy Libman 10/27/2021, 12:19 PM

## 2021-10-27 NOTE — Progress Notes (Signed)
Patient agreeable to continue bowel program. Patient positioned and noted moderate incontinent stool on brief. Digital stimulation done and obtained moderate mushy stool. Peri care done and patient repositioned to comfort.

## 2021-10-27 NOTE — Progress Notes (Signed)
Physical Therapy Session Note  Patient Details  Name: Jason [name] MRN: [id number] Date of Birth: [birth year]  Today's Date: 10/27/2021 PT Individual Time: 1000-1030 PT Individual Time Calculation (min): 30 min   Short Term Goals: Week 2:     Skilled Therapeutic Interventions/Progress Updates: Pt presents semi-reclined in bed and eager for therapy.  Pt requires total A to flex knees to hooklying position w/ min A to maintain position.  Pt requires max A for rolling to Left and able to maintain sidelying position using siderail.  Pt requires Total A for sidelying to sit at EOB A +2.  Pt requires total A to lean to left and for placement of SB.  Pt required Max A + 2 for SB transfer to R bed > TIS.  Pt requires max A for scooting back into w/c.  Pt remained sitting in TIS tilted back w/ chair alarm on and all needs in reach.  Pt aware of lean to R and uses R FA to push back into midline sitting.     Therapy Documentation Precautions:  Precautions Precautions: Fall, Cervical Precaution Comments: C6 laminectomy, MAP >65 Restrictions Weight Bearing Restrictions: No RUE Weight Bearing: Non weight bearing General:   Vital Signs: Oxygen Therapy SpO2: 98 % O2 Device: Room Air Pain:no c/o       Therapy/Group: Individual Therapy  Ladoris Gene 10/27/2021, 10:31 AM

## 2021-10-28 LAB — BASIC METABOLIC PANEL
Anion gap: 7 (ref 5–15)
BUN: 22 mg/dL (ref 8–23)
CO2: 23 mmol/L (ref 22–32)
Calcium: 8.5 mg/dL — ABNORMAL LOW (ref 8.9–10.3)
Chloride: 106 mmol/L (ref 98–111)
Creatinine, Ser: 0.51 mg/dL — ABNORMAL LOW (ref 0.61–1.24)
GFR, Estimated: >60 mL/min (ref >60)
Glucose, Bld: 87 mg/dL (ref 70–99)
Potassium: 3.8 mmol/L (ref 3.5–5.1)
Sodium: 136 mmol/L (ref 135–145)

## 2021-10-28 NOTE — Progress Notes (Signed)
Bowel program completed, dig stem performed x2.. No bowel movement noted at this time. Will pass on in report.

## 2021-10-28 NOTE — Progress Notes (Signed)
Physical Therapy Session Note  Patient Details  Name: Jason [name] MRN: [id number] Date of Birth: 01/28/1956  Today's Date: 10/28/2021 PT Individual Time: 4098-1191, 4782-9562 PT Individual Time Calculation (min): 61 min, 75 min   Short Term Goals: Week 1:  PT Short Term Goal 1 (Week 1): pt will perform slideboard transfer with assist PT Short Term Goal 1 - Progress (Week 1): Met PT Short Term Goal 2 (Week 1): Pt with sit EOB with max A or better PT Short Term Goal 2 - Progress (Week 1): Met PT Short Term Goal 3 (Week 1): Pt will tolerate sitting up in w/c between therapy sessions. PT Short Term Goal 3 - Progress (Week 1): Met  Skilled Therapeutic Interventions/Progress Updates:    Session 1: pt received in bed and agreeable to therapy. No complaint of pain. Donned shoes tot A. Pt's wife provided + 2 assist for supine>sit and slideboard transfer. Pt able to recall steps for rolling to begin directing care. Pt required max of 2 for trunk elevation and BLE management. Pt then required mod-max A for sitting balance EOB. Pt performed transfer with max A x 1 and mod A from +2. Once seated in w/c, pt became emotional about concern for his wife. Provided psychosocial support, encouragement, and education on d/c plan as appropriate. Pt then transported to gym. Performed sitting balance activities in w/c with mod-max A required for sitting balance throughout. Pt able to perform mini pushups with hands on knees with multimodal facilitation and assist to maintain, with difficulty maintaining midline. Pt also worked on using abdominals and head to perform small crunches to bring body forward. Pt remained in chair at end of session, was left with all needs in reach and alarm active.   Session 2: Pt seated in w/c on arrival and agreeable to therapy. No complaint of pain. Therapist retrieved [place] and charger, session focused on transfers, sitting balance, and PWC management. Pt transported to therapy gym and  participated in max x 2 slideboard transfer. Pt performed transfer x 5 during session with max of 2 for sitting balance and to perform slide. While seated on mat table, pt performed static sitting balance practice with BUE support. Pt able to use RUE for support with intermittent success, but continues to demo R lateral lean. Pt also performed lateral leans to BIL elbows, able to perform "push up plus" shoulder protraction against table on L side for improved shoulder stability. Pt then introduced to [facility] features, including TIS and recline functions for independent pressure relief. Pt navigated throughout hallways and through cones x 6 for practice in tight spaces. Therapist also adjusted head rest and ensured all parts of chair were adjusted appropriate. Pt returned to bed as described above, tot A sit>supine. Pt then remained in bed with his heels elevated and was left with all needs in reach and alarm active.    Therapy Documentation Precautions:  Precautions Precautions: Fall, Cervical Precaution Comments: C6 laminectomy, MAP >65 Restrictions Weight Bearing Restrictions: No RUE Weight Bearing: Non weight bearing General:       Therapy/Group: Individual Therapy  [name] 10/28/2021, 12:51 PM

## 2021-10-28 NOTE — Progress Notes (Signed)
PROGRESS NOTE   Subjective/Complaints:  Pt seen and evaluated at bedside. No complaints. Wife brought in water pick and tooth feeling better.   ROS: Pt denies SOB, abd pain, CP, N/V/C/D, and vision changes   Except for HPI  Objective:   No results found.  Recent Labs    10/27/21 0638  WBC 7.9  HGB 9.9*  HCT 29.8*  PLT 285    Recent Labs    10/28/21 0535  NA 136  K 3.8  CL 106  CO2 23  GLUCOSE 87  BUN 22  CREATININE 0.51*  CALCIUM 8.5*     Intake/Output Summary (Last 24 hours) at 10/28/2021 0830 Last data filed at 10/28/2021 0806 Gross per 24 hour  Intake 956 ml  Output 2300 ml  Net -1344 ml         Physical Exam: Vital Signs Blood pressure 127/64, pulse 71, temperature 98.2 F (36.8 C), resp. rate 16, height _0  (1.753 m), weight 99.9 kg, SpO2 98 %.   General: awake, alert, appropriate, sitting up NAD HENT: conjugate gaze; oropharynx moist- gingivitis R bottom teeth CV: regular rate; no JVD Pulmonary: CTA B/L; no W/R/R- good air movement- intermittent coughing GI: soft, NT, ND, (+)BS Psychiatric: appropriate  Genitourinary: Brown tint to otherwise clear urine  Musculoskeletal: L shoulder K tape in place- still subluxed    Cervical back: Neck supple.   Moving all 4 extremities today; prior exams:  RUE- biceps 5/5; WE 4-/5; Triceps 2+/5; Grip 3-/5; FA 2-/5 LUE- biceps 5-/5; WE 4/5; Triceps 2+/5' Grip 3+/5; FA 2-/5 RLE- 0/5 in RLE LLE- HF 1/5; KE 2-/5; DF 1/5; PF 2/5; EHL 1/5  Decreased to light touch from L1 inferiorly; note sacrals not tested RLE spasms decreased; none in LLE  Assessment/Plan: 1. Functional deficits which require 3+ hours per day of interdisciplinary therapy in a comprehensive inpatient rehab setting. Physiatrist is providing close team supervision and 24 hour management of active medical problems listed below. Physiatrist and rehab team continue to assess barriers  to discharge/monitor patient progress toward functional and medical goals  Care Tool:  Bathing    Body parts bathed by patient: Face   Body parts bathed by helper: Right arm, Left arm, Chest, Abdomen, Front perineal area, Buttocks, Left lower leg, Right lower leg, Left upper leg, Right upper leg     Bathing assist Assist Level: Dependent - Patient 0%     Upper Body Dressing/Undressing Upper body dressing   What is the patient wearing?: Pull over shirt    Upper body assist Assist Level: Maximal Assistance - Patient 25 - 49%    Lower Body Dressing/Undressing Lower body dressing      What is the patient wearing?: Pants     Lower body assist Assist for lower body dressing: Total Assistance - Patient < 25%     Toileting Toileting    Toileting assist Assist for toileting: Dependent - Patient 0%     Transfers Chair/bed transfer  Transfers assist  Chair/bed transfer activity did not occur: Safety/medical concerns  Chair/bed transfer assist level: 2 Helpers     Locomotion Ambulation   Ambulation assist   Ambulation activity did not occur:  Safety/medical concerns          Walk 10 feet activity   Assist  Walk 10 feet activity did not occur: Safety/medical concerns        Walk 50 feet activity   Assist Walk 50 feet with 2 turns activity did not occur: Safety/medical concerns         Walk 150 feet activity   Assist Walk 150 feet activity did not occur: Safety/medical concerns         Walk 10 feet on uneven surface  activity   Assist Walk 10 feet on uneven surfaces activity did not occur: Safety/medical concerns         Wheelchair     Assist Is the patient using a wheelchair?: Yes (Pt will use w/c but was not able to get up to chair today) Type of Wheelchair: Manual Wheelchair activity did not occur: Safety/medical concerns         Wheelchair 50 feet with 2 turns activity    Assist    Wheelchair 50 feet with 2 turns  activity did not occur: Safety/medical concerns       Wheelchair 150 feet activity     Assist  Wheelchair 150 feet activity did not occur: Safety/medical concerns       Blood pressure 127/64, pulse 71, temperature 98.2 F (36.8 C), resp. rate 16, height _0  (1.753 m), weight 99.9 kg, SpO2 98 %.  Medical Problem List and Plan: 1. 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  Antithrombotics: -DVT/anticoagulation:  Pharmaceutical: Lovenox.               -antiplatelet therapy: ASA 3. Pain Management: Oxycodone prn. 8/28- pt reports no significant pain- and doesn't want spasticity meds at this time. 9/3- pain controlled with meds- cont    4. Mood/Behavior/Sleep: LCSW to follow for evaluation and support.              -antipsychotic agents: N/A 5. Neuropsych/cognition: This patient is capable of making decisions on his own behalf. 6. Skin/Wound Care: Routine pressure relief measures              --monitor incision for healing.  7. Fluids/Electrolytes/Nutrition: Monitor I/O. Check CMET in am.  8. Prostate AdenoCA w/mets: Loading dose Firmagon 240 mg 10/06/21 per Dr. Tasia Catchings --Charlesetta Garibaldi changed to [facility] due to insurance coverage--next dose around 09/13. --Simulation for XRT 08/31 at 2 pm at [facility] 8/31- getting simulation for XRT and consult today 9/2- XRT to start 9/12 9. Dysphagia: Continue D3 w/chopped meats and assist at meals. Aspiration precautions.  10. Neurogenic bladder: continue foley for now  8/31- pt doesn't want to remove to  see if can void; or to do in/out caths- thinks too much stress on family.   9/3- still has some brownish  urine- see through.  11. Neurogenic bowel: Will get KUB to  determine stool burden --Colace in am with suppository after supper.  8/25- had good results with bowel program 8/27- good results with bowel program yesterday 8/28- having bowel accidents- and BM with bowel program last 3 nights-  8/29- 2 small  BM's last night after bowel program- con't regimen 9/2-9/3 Having results with bowel program nightly 12. Sepsis/Likely aspiration PNA: Now on Augmentin--antibiotic D#5/10. BC pend-->neg so far.              --Follow up CXR in am.              --pulmonary hygiene for atelectasis/Asymmetric volume loss on right.  13. Abnormal LFTs: Improved overall except for elevation in Aphos--315. Will recheck in am  8/25- Alk phos up to 457; AST 62 and ALT 96- except for alk phos, they are heading downwards- con't to monitor weekly.  14. ABLA: Recheck in am  -stable 8.7 HGB 8/26, 8/30, 9/3   15. Leucocytosis: Recheck in am. Monitor for fevers/other signs of infection.    8/25- WBC 17.1k- on Augmentin- will recheck labs in AM  -WBC improved to 15.6 10/19/21  8/28- Down to 10.8k- con't to monitor  8/31- WBC 9.5k- doing better            9/3 - 7.9 16. Hypotension: Will continue midodrine for now.    8/25- BP better 120s/70s- con't regimen 17. DVT left gastrocnemius vein  -8/26 Noted Lovenox was increased 8/25 to 141m BID, Distal DVT, will recheck in 1-2 weeks, compression stockings,    18. Spasticity  8/28- pt doesn't want spasticity meds- isn't painful or annoying.  [place] 8/28- will d/c staples and [facility] 8[facility]- need to see about FMills [facility]ler-supposed to be given 9/12- Oncology to handle it- L/M/via PA about this.  8/30- per pt, they aren't using Firmagen- are using other product- will d/w Oncology more 9/2- XRT starts  9/12- 9/26  Will have pt seen by W/C rep- and justify - will likely need power w/c with ROHO cushion- will add details as we know them   [place]: [place]2021, 8:30 AM

## 2021-10-28 NOTE — Progress Notes (Signed)
Occupational Therapy Session Note  Patient Details  Name: Jason [name] MRN: [id number] Date of Birth: 1956/01/24  Today's Date: 10/28/2021 OT Individual Time: 1100-1156 OT Individual Time Calculation (min): 56 min    Short Term Goals: Week 2:  OT Short Term Goal 1 (Week 2): Patient will complete self feeding task with set-up using AE for at least 20 minutes before requiring additional assistance from spouse/rehab staff to complete. OT Short Term Goal 2 (Week 2): Pt will perform sitting EOB with min A for RUE simple functional or reaching task  Skilled Therapeutic Interventions/Progress Updates:    Pt resting in TIS w/c upon arrival with wife present. OT intervention with focus on SB tranfsers, sitting balance, and lateral lean to Rt. SB tranfsers with max A+2. Pt able to verbalize correct technique during transfer. Static sitting balance with max A. Block practice mini crunches 4x5 with max A. Pt able to maintain with support on RUE. Block lateral lean to Rt with pushing to upright with max A 4x5. Extended rest breaks between sets. Pt returned to room and remained in w/c with wife present.   Therapy Documentation Precautions:  Precautions Precautions: Fall, Cervical Precaution Comments: C6 laminectomy, MAP >65 Restrictions Weight Bearing Restrictions: No RUE Weight Bearing: Non weight bearing  Pain: Pt denies pain this morning  Therapy/Group: Individual Therapy  Leroy Libman 10/28/2021, 12:00 PM

## 2021-10-29 ENCOUNTER — Telehealth: Payer: Self-pay

## 2021-10-29 NOTE — Telephone Encounter (Signed)
Please r/s appts as requested by MD and inform pt of new appt.

## 2021-10-29 NOTE — Patient Care Conference (Signed)
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 Courtney Heys Social Worker Present: Loralee Pacas, [place] Nurse Present: Other (comment) Tacy Learn, RN) PT Present: Ailene Rud, PT OT Present: Willeen Cass, OT;Roanna Epley, COTA PPS Coordinator present : Gunnar Fusi, [name]     Current Status/Progress Goal Weekly Team Focus  Bowel/Bladder   Bowel program/foley. LBM 10/28/21  Regain B/B continence  Teach pt/family foley care and bowel program   Swallow/Nutrition/ Hydration             ADL's   SB transfsers max A+2; self care-max A; self feeding-min A; sitting balance-max A  mod A pt BADL's and indep for caregiver with DME  sitting balance, SB tranfsers, BADLs, education, discharge planning   Mobility   tot +2 bed mobility, mod-max sitting balance, max x 2 SBT, PWC  mod A bed mobility, min A bed to chair transfers  transfers, PWC mobility   Communication             Safety/Cognition/ Behavioral Observations            Pain   No pain verbalize by pt  pt will be free of pain with pain level <3/10  Assess for pain q shift and prn   Skin   Incicion to posterior back with steri strip open to air  skin will be free from infection and further breakdown  Assess skin q shift and prn for  breakdown     Discharge Planning:  D/c to home with support from wife as primary caregiver. Pt will begin radiation treatment 9/13-9/25pm. Transportation scheduled with [facility] for p/u.   Team Discussion: Prostate cancer metastatic to CNS. Bowel program, incontinent. Chronic foley. Quad cough taught to wife. Encourage flutter valve. Left shoulder pain continues with PRN's. MASD to sacrum. Abrasion to left elbow healing. Incision to neck OTA with some drainage. Bed level total +2, SB max +2. Power wheelchair evaluation.  Patient on target to meet rehab goals: no, patient very slow to make improvements  *See Care Plan and progress notes for long and short-term goals.   Revisions to Treatment Plan:  Power wheelchair eval  Teaching Needs: Medications, bowel program, foley care, safety, skin/wound care, transfer training, etc.  Current Barriers to Discharge: Decreased caregiver support, Home enviroment access/layout, Incontinence, Neurogenic bowel and bladder, Wound care, Insurance for SNF coverage, Weight, Weight bearing restrictions, and Pending chemo/radiation  Possible Resolutions to Barriers: Family education, medication education, home foley care education, bowel program education, order recommended DME     Medical Summary Current Status: LBM 9/4- on iron- black/green stool- L shoulder pain- wife taught Quad cough- weak cough- flutter valve as well- MASD to sacrum  Barriers to Discharge: Decreased family/caregiver support;Home enviroment access/layout;Medical stability;Pending chemo/radiation;Weight bearing restrictions;Wound care  Barriers to Discharge Comments: CGA  for sittign balance at best; mod-max dyynamic- working on power w/c mobility- [employer] to come in for w/c eval- Wife taking home Possible Resolutions to [employer] Focus: Starting XRT 9/12- goals mod A- sitting balance biggest issue with his SCI- SB transfer max of 2; equipment- needs power w/c; hospital bed; likely  hoyer lift; padded tub bench with cut out; both hard workers- but just not doing as well as possible- 9/26   Continued Need for Acute Rehabilitation Level of Care: The patient requires daily medical management by a physician with specialized training in physical medicine and rehabilitation for the following reasons: Direction of a multidisciplinary physical rehabilitation program to maximize functional independence : Yes Medical management of patient stability for increased activity during participation in an intensive rehabilitation regime.: Yes Analysis of laboratory values and/or radiology reports with any subsequent need for medication adjustment and/or medical intervention. : Yes   I attest that I was present, lead the team conference, and concur with the assessment and plan of the team.   Ernest Pine 10/29/2021, 4:10 PM

## 2021-10-29 NOTE — Progress Notes (Signed)
Dig stem completed x1, patient tolerated well. Suppository given after dig stem. After 20 minutes  post suppository administration, dig stem was completed again. Stool noted on finger and there was a smear of stool on brief. Patient tolerated well. Patient states he feels like he "may go soon". Will notify next shift of these findings.

## 2021-10-29 NOTE — Progress Notes (Signed)
Patient ID: Jason [name], male   DOB: February 23, 1956, 66 y.o.   MRN: [id number]  SW met with pt and pt wife to provide updates from team conference on d/c date being 9/26 until radiation treatment completed. SW discussed recommended DME: hospital bed, hoyer lift, and padded tub shower bench (private purchase). Pt wife reports they are in the process of remodeling their bathroom and will meet with the contractor. SW and OT discussed in detail the benefits of the shower bench needed for pt. Pt wife aware SW will order other DME items. They both are aware transportation with [facility] is set up for next week to begin treatment.  SW ordered hospital bed and hoyer lift with [place] via parachute. SW sent demo sheet to [facility] for future w/c eval.   Loralee Pacas, MSW, [place] Office: [phone] Cell: [phone] Fax: [phone]

## 2021-10-29 NOTE — Progress Notes (Signed)
Physical Therapy Session Note  Patient Details  Name: Jason [name] MRN: [id number] Date of Birth: 1956/02/11  Today's Date: 10/29/2021 PT Individual Time: 0940-1055 PT Individual Time Calculation (min): 75 min   Short Term Goals: Week 2:  PT Short Term Goal 1 (Week 2): Pt will initiate PWC mobility with assist PT Short Term Goal 2 (Week 2): Pt will tolerate sitting OOB >8 hours PT Short Term Goal 3 (Week 2): Pt will perform slideboard transfers with max A x1 consistently  Skilled Therapeutic Interventions/Progress Updates:    Pt received in [place], agreeable to therapy. No complaint of pain. Pt navigates w/c with supervision and cues for navigation. Session focused on block transfers with slideboard and sitting balance. Pt able to recall some steps of transfer and beginning ability to direct care. Pt performed block practice with slideboard transfer and scooting on mat with emphasis on head hips relationship and pushing through BLE for improved lift. Pt required max A x 2 during transfers. While seated EOM, pt was able to maintain static sitting balance with as little as CGA for 5-10 seconds but required min A consistently for longer periods and max A for any dynamic stability. Discussed strategies for boosting in chair using TIS feature for gravity assist. Pt practiced tight turns with PWC using cones, with 80% ability to avoid cones in small spaces. Pt returned to room and participated in pressure relief with cues, pt was able to use chair features with cueing assist only. Pt was left with all needs in reach and his wife present.   Therapy Documentation Precautions:  Precautions Precautions: Fall, Cervical Precaution Comments: C6 laminectomy, MAP >65 Restrictions Weight Bearing Restrictions: No RUE Weight Bearing: Non weight bearing General:       Therapy/Group: Individual Therapy  [name] 10/29/2021, 12:29 PM

## 2021-10-29 NOTE — Plan of Care (Signed)
  Problem: RH Pre-functional/Other (Specify) Goal: RH LTG OT (Specify) 1 Description: RH LTG OT (Specify) 1 Flowsheets (Taken 10/29/2021 1117) LTG: Other OT (Specify) 1: Pt will direct a caregiver how to assist him with ADLs and transfers with supervision.

## 2021-10-29 NOTE — Progress Notes (Signed)
Occupational Therapy Session Note  Patient Details  Name: Jason [name] MRN: [id number] Date of Birth: [birth year]  Today's Date: 10/29/2021 OT Individual Time: 1300-1400 OT Individual Time Calculation (min): 60 min    Short Term Goals: Week 2:  OT Short Term Goal 1 (Week 2): Patient will complete self feeding task with set-up using AE for at least 20 minutes before requiring additional assistance from spouse/rehab staff to complete. OT Short Term Goal 2 (Week 2): Pt will perform sitting EOB with min A for RUE simple functional or reaching task  Skilled Therapeutic Interventions/Progress Updates:    Pt resting in [place] upon arrival. OT intervention with focus on continued discharge planning, DME recommendations, PWC mobility, and SB transfers. Recommended rolling shower chair for use at home in addition to hoyer lift. Pt maneurvered PWC to equipment room and OTA demonstrated use of shower chair. Pt practiced use of PWC in obstacle course, ADL apartment, entering/exiting various door widths, and opening room door while seated in [place]. SB transfer to EOB with max A+2. Repositioninig in bed with tot A+2. Pt remained in bed with wife present. All needs within reach.   Therapy Documentation Precautions:  Precautions Precautions: Fall, Cervical Precaution Comments: C6 laminectomy, MAP >65 Restrictions Weight Bearing Restrictions: No RUE Weight Bearing: Non weight bearing   Pain:  Pt denies pain this afternoon   Therapy/Group: Individual Therapy  Leroy Libman 10/29/2021, 2:02 PM

## 2021-10-29 NOTE — Progress Notes (Signed)
Occupational Therapy Session Note  Patient Details  Name: Jason [name] MRN: [id number] Date of Birth: 29-Oct-1955  Today's Date: 10/29/2021 OT Individual Time: 0800-0900 OT Individual Time Calculation (min): 60 min    Short Term Goals: Week 2:  OT Short Term Goal 1 (Week 2): Patient will complete self feeding task with set-up using AE for at least 20 minutes before requiring additional assistance from spouse/rehab staff to complete. OT Short Term Goal 2 (Week 2): Pt will perform sitting EOB with min A for RUE simple functional or reaching task  Skilled Therapeutic Interventions/Progress Updates:  Pt seen for am OT session with focus on trunk and UE strength for AM transfer oob and self care at sink side. Pt with improved ability to initiate momentum for rolling while OT and Therapy Tech assisted pt bed level to done slip in shorts, thigh high TEDS and tennis shoes. Following completion LE management with max A off EOB with sidelying to sit using rails toward L side such that R UE could be utilized more effectively due to L sh sublux and weakness. Pt then able to weight shift to find center of gravity with core/trunk engagement with OT steadying to CGA for at least 2-3 minutes for orthostatic check. See flowsheets with all VSR normal. Pt able to weight shift to L side with max A and assist with board placement on R side. With use of bed elevation assist pt was able to self initiate 4 scoots across board with max A x 1 and S x TT for safety. Power TIS w/c level- pt performed sink side grooming, UB bathing with mod A (face, chest, arms and abdomen), dressing pullover shirt both donning and doffing with hemi techniques for L shoulder deficits and mod A. Min A set up and completion of oral care with functional integration of L hand and R as primary assist. Hair combing with max A L side, mod A R side of head. Max A deodorant due to B UE limited reach to axilla. Pt was able to maneuver power w/c to bedside  for am meal with min A due to novel use. Left pt in the care of nursing for meds and wife assisting otherwise.   Therapy Documentation Precautions:  Precautions Precautions: Fall, Cervical Precaution Comments: C6 laminectomy, MAP >65 Restrictions Weight Bearing Restrictions: No RUE Weight Bearing: Non weight bearing    Therapy/Group: Individual Therapy  Barnabas Lister 10/29/2021, 8:51 AM

## 2021-10-29 NOTE — Telephone Encounter (Signed)
-----   Message from Earlie Server, MD sent at 10/27/2021 12:26 AM EDT ----- He will be at inpatient rehab for 4 weeks. Please move his appt to be around 10/11, lab MD eligard

## 2021-10-29 NOTE — Plan of Care (Signed)
  Problem: RH Toileting Goal: LTG Patient will perform toileting task (3/3 steps) with assistance level (OT) Description: LTG: Patient will perform toileting task (3/3 steps) with assistance level (OT)  Outcome: Not Applicable Flowsheets (Taken 10/29/2021 1112) LTG: Pt will perform toileting task (3/3 steps) with assistance level: (d/c goal) -- Note: D/c goal

## 2021-10-29 NOTE — Progress Notes (Signed)
PROGRESS NOTE   Subjective/Complaints:  Pt reports doing well- hasn't eaten yet- bowel program doing well per pt, but no BM documented last night.  Denies pain Sleeping well.     ROS:  Pt denies SOB, abd pain, CP, N/V/C/D, and vision changes   Except for HPI  Objective:   No results found.  Recent Labs    10/27/21 0638  WBC 7.9  HGB 9.9*  HCT 29.8*  PLT 285   Recent Labs    10/28/21 0535  NA 136  K 3.8  CL 106  CO2 23  GLUCOSE 87  BUN 22  CREATININE 0.51*  CALCIUM 8.5*     Intake/Output Summary (Last 24 hours) at 10/29/2021 0843 Last data filed at 10/29/2021 0448 Gross per 24 hour  Intake 720 ml  Output 1750 ml  Net -1030 ml        Physical Exam: Vital Signs Blood pressure (!) 144/77, pulse 70, temperature 98.5 F (36.9 C), temperature source Oral, resp. rate 17, height '5\' 9"'  (1.753 m), weight 99.9 kg, SpO2 97 %.    General: awake, alert, appropriate, sitting up in bed; NAD HENT: conjugate gaze; oropharynx moist CV: regular rate; no JVD Pulmonary: slightly coarse, but no specific Wheezing/rales/rhonchi- adequate air movement GI: soft, NT, ND, (+)BS Psychiatric: appropriate Neurological: Ox3  Genitourinary: Brown tint to otherwise clear urine  Musculoskeletal: L shoulder K tape in place- still subluxed    Cervical back: Neck supple.   Moving all 4 extremities today; prior exams:  RUE- biceps 5/5; WE 4-/5; Triceps 2+/5; Grip 3-/5; FA 2-/5 LUE- biceps 5-/5; WE 4/5; Triceps 2+/5' Grip 3+/5; FA 2-/5 RLE- 0/5 in RLE LLE- HF 1/5; KE 2-/5; DF 1/5; PF 2/5; EHL 1/5  Decreased to light touch from L1 inferiorly; note sacrals not tested RLE spasms decreased; none in LLE  Assessment/Plan: 1. Functional deficits which require 3+ hours per day of interdisciplinary therapy in a comprehensive inpatient rehab setting. Physiatrist is providing close team supervision and 24 hour management of active  medical problems listed below. Physiatrist and rehab team continue to assess barriers to discharge/monitor patient progress toward functional and medical goals  Care Tool:  Bathing    Body parts bathed by patient: Face   Body parts bathed by helper: Right arm, Left arm, Chest, Abdomen, Front perineal area, Buttocks, Left lower leg, Right lower leg, Left upper leg, Right upper leg     Bathing assist Assist Level: Dependent - Patient 0%     Upper Body Dressing/Undressing Upper body dressing   What is the patient wearing?: Pull over shirt    Upper body assist Assist Level: Maximal Assistance - Patient 25 - 49%    Lower Body Dressing/Undressing Lower body dressing      What is the patient wearing?: Pants     Lower body assist Assist for lower body dressing: Total Assistance - Patient < 25%     Toileting Toileting    Toileting assist Assist for toileting: Dependent - Patient 0%     Transfers Chair/bed transfer  Transfers assist  Chair/bed transfer activity did not occur: Safety/medical concerns  Chair/bed transfer assist level: 2 Helpers  Locomotion Ambulation   Ambulation assist   Ambulation activity did not occur: Safety/medical concerns          Walk 10 feet activity   Assist  Walk 10 feet activity did not occur: Safety/medical concerns        Walk 50 feet activity   Assist Walk 50 feet with 2 turns activity did not occur: Safety/medical concerns         Walk 150 feet activity   Assist Walk 150 feet activity did not occur: Safety/medical concerns         Walk 10 feet on uneven surface  activity   Assist Walk 10 feet on uneven surfaces activity did not occur: Safety/medical concerns         Wheelchair     Assist Is the patient using a wheelchair?: Yes (Pt will use w/c but was not able to get up to chair today) Type of Wheelchair: Manual Wheelchair activity did not occur: Safety/medical concerns          Wheelchair 50 feet with 2 turns activity    Assist    Wheelchair 50 feet with 2 turns activity did not occur: Safety/medical concerns       Wheelchair 150 feet activity     Assist  Wheelchair 150 feet activity did not occur: Safety/medical concerns       Blood pressure (!) 144/77, pulse 70, temperature 98.5 F (36.9 C), temperature source Oral, resp. rate 17, height '5\' 9"'  (1.753 m), weight 99.9 kg, SpO2 97 %.  Medical Problem List and Plan: 1. 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  Antithrombotics: -DVT/anticoagulation:  Pharmaceutical: Lovenox.               -antiplatelet therapy: ASA 3. Pain Management: Oxycodone prn. 8/28- pt reports no significant pain- and doesn't want spasticity meds at this time. 9/5- Pain controlled- cont' regimen   4. Mood/Behavior/Sleep: LCSW to follow for evaluation and support.              -antipsychotic agents: N/A 5. Neuropsych/cognition: This patient is capable of making decisions on his own behalf. 6. Skin/Wound Care: Routine pressure relief measures              --monitor incision for healing.  7. Fluids/Electrolytes/Nutrition: Monitor I/O. Check CMET in am.  8. Prostate AdenoCA w/mets: Loading dose Firmagon 240 mg 10/06/21 per Dr. Tasia Catchings --Charlesetta Garibaldi changed to [facility] due to insurance coverage--next dose around 09/13. --Simulation for XRT 08/31 at 2 pm at [facility] 8/31- getting simulation for XRT and consult today 9/2- XRT to start 9/12 9. Dysphagia: Continue D3 w/chopped meats and assist at meals. Aspiration precautions.  10. Neurogenic bladder: continue foley for now  8/31- pt doesn't want to remove to  see if can void; or to do in/out caths- thinks too much stress on  family.   9/3- still has some brownish  urine- see through.  11. Neurogenic bowel: Will get KUB to determine stool burden --Colace in am with suppository after supper.  8/25- had good results with bowel program 8/27- good results with bowel program yesterday 8/28- having bowel accidents- and  BM with bowel program last 3 nights-  8/29- 2 small BM's last night after bowel program- con't regimen 9/2-9/3 Having results with bowel program nightly  9/5- Didn't have results last night- but had before- will con't to monitor 12. Sepsis/Likely aspiration PNA: Now on Augmentin--antibiotic D#5/10. BC pend-->neg so far.              --Follow up CXR in am.              --pulmonary hygiene for atelectasis/Asymmetric volume loss on right.  13. Abnormal LFTs: Improved overall except for elevation in Aph[place]--315. Will recheck in am  8/25- Alk phos up to 457; AST 62 and ALT 96- except for alk phos, they are heading downwards- con't to monitor weekly.  14. ABLA: Recheck in am  -stable 8.7 HGB 8/26, 8/30, 9/3   15. Leucocytosis: Recheck in am. Monitor for fevers/other signs of infection.    8/25- WBC 17.1k- on Augmentin- will recheck labs in AM  -WBC improved to 15.6 10/19/21  8/28- Down to 10.8k- con't to monitor  8/31- WBC 9.5k- doing better            9/3 - 7.9 16. Hypotension: Will continue midodrine for now.    8/25- BP better 120s/70s- con't regimen 17. DVT left gastrocnemius vein  -8/26 Noted Lovenox was increased 8/25 to 147m BID, Distal DVT, will recheck in 1-2 weeks, compression stockings,    18. Spasticity  8/28- pt doesn't want spasticity meds- isn't painful or annoying.  1[place] 8/28- will d/c staples and [facility]- need to see about FMills Koller-supposed to be given 9/12- Oncology to handle it- L/M/via PA about this.  8/30- per pt, they aren't using Firmagen- are using other product- will d/w Oncology more 9/2- XRT starts  9/12-  9/26  Will have pt seen by W/C rep- and justify - will likely need power w/c with ROHO cushion- will add details as we know them  I spent a total of 35   minutes on total care today- >50% coordination of care- due to team conference to f/u on progress with therapy    LOS: 12 days A FACE TO FACE EVALUATION WAS PERFORMED  Dorsel Flinn 10/29/2021, 8:43 AM

## 2021-10-30 DIAGNOSIS — C7951 Secondary malignant neoplasm of bone: Secondary | ICD-10-CM | POA: Insufficient documentation

## 2021-10-30 DIAGNOSIS — C61 Malignant neoplasm of prostate: Secondary | ICD-10-CM | POA: Insufficient documentation

## 2021-10-30 DIAGNOSIS — E785 Hyperlipidemia, unspecified: Secondary | ICD-10-CM | POA: Insufficient documentation

## 2021-10-30 DIAGNOSIS — Z79899 Other long term (current) drug therapy: Secondary | ICD-10-CM | POA: Insufficient documentation

## 2021-10-30 DIAGNOSIS — C7931 Secondary malignant neoplasm of brain: Secondary | ICD-10-CM | POA: Diagnosis not present

## 2021-10-30 DIAGNOSIS — I10 Essential (primary) hypertension: Secondary | ICD-10-CM | POA: Insufficient documentation

## 2021-10-30 DIAGNOSIS — C490 Malignant neoplasm of connective and soft tissue of head, face and neck: Secondary | ICD-10-CM | POA: Diagnosis not present

## 2021-10-30 LAB — CBC
HCT: 31.5 % — ABNORMAL LOW (ref 39.0–52.0)
Hemoglobin: 10.2 g/dL — ABNORMAL LOW (ref 13.0–17.0)
MCH: 31.9 pg (ref 26.0–34.0)
MCHC: 32.4 g/dL (ref 30.0–36.0)
MCV: 98.4 fL (ref 80.0–100.0)
Platelets: 290 K/uL (ref 150–400)
RBC: 3.2 MIL/uL — ABNORMAL LOW (ref 4.22–5.81)
RDW: 17.5 % — ABNORMAL HIGH (ref 11.5–15.5)
WBC: 6.4 K/uL (ref 4.0–10.5)
nRBC: 0 % (ref 0.0–0.2)

## 2021-10-30 LAB — HEPATIC FUNCTION PANEL
ALT: 33 U/L (ref 0–44)
AST: 26 U/L (ref 15–41)
Albumin: 3.1 g/dL — ABNORMAL LOW (ref 3.5–5.0)
Alkaline Phosphatase: 804 U/L — ABNORMAL HIGH (ref 38–126)
Bilirubin, Direct: 0.2 mg/dL (ref 0.0–0.2)
Indirect Bilirubin: 0.7 mg/dL (ref 0.3–0.9)
Total Bilirubin: 0.9 mg/dL (ref 0.3–1.2)
Total Protein: 6.1 g/dL — ABNORMAL LOW (ref 6.5–8.1)

## 2021-10-30 NOTE — Progress Notes (Signed)
Physical Therapy Session Note  Patient Details  Name: Jason [name] MRN: [id number] Date of Birth: 11/01/1955  Today's Date: 10/30/2021 PT Individual Time: 1100-1200 PT Individual Time Calculation (min): 60 min   Short Term Goals: Week 2:  PT Short Term Goal 1 (Week 2): Pt will initiate PWC mobility with assist PT Short Term Goal 2 (Week 2): Pt will tolerate sitting OOB >8 hours PT Short Term Goal 3 (Week 2): Pt will perform slideboard transfers with max A x1 consistently  Skilled Therapeutic Interventions/Progress Updates:    Pt received in [place], agreeable to therapy. No complaint of pain. Began session with discussion of transportation options and home set up. Pt and wife hope to be able to use their personal vehicle for transport. Discussed plan to problem to problem solve on later date. Pt then navigated PWC throughout session. Cues for setting up chair for transfer. Pt performed slideboard transfer <>w/c with max A x2. Pt able to assist with placing board by performing lateral lean with CGA. Pt with assist for trunk control throughout, mod-max A. Supine>sit with mod A x 2 for BLE management and some assist with trunk control from lateral elbow lean to supine. Attempted using Leg loop for LE management, but pt unable to reach loop effectively. Then practiced rolling x 2 BIL with emphasis on using momentum, "punch" and trunk flexion/rotation for rolling. Pt able to roll with as little as min A. Sit>supine with max A and +2 for safety, for BLE management, trunk stability,  and step by step cues for trunk elevation. Pt returned to room after session and remained in w/c, using TIS for pressure relief, his wife present.   Therapy Documentation Precautions:  Precautions Precautions: Fall, Cervical Precaution Comments: C6 laminectomy, MAP >65 Restrictions Weight Bearing Restrictions: No RUE Weight Bearing: Non weight bearing General:     Therapy/Group: Individual Therapy  [name] 10/30/2021, 12:31 PM

## 2021-10-30 NOTE — Progress Notes (Signed)
ANTICOAGULATION CONSULT NOTE-Follow Up Pharmacy Consult for Lovenox Indication: DVT  No Known Allergies  Patient Measurements: Height: '5\' 9"'$  (175.3 cm) Weight: 99.9 kg (220 lb 3.8 oz) IBW/kg (Calculated) : 70.7 Heparin Dosing Weight:   Vital Signs: Temp: 98.4 F (36.9 C) (09/06 0515) BP: 124/68 (09/06 0515) Pulse Rate: 66 (09/06 0515)  Labs: Recent Labs    10/28/21 0535 10/30/21 0631  HGB  --  10.2*  HCT  --  31.5*  PLT  --  290  CREATININE 0.51*  --      Estimated Creatinine Clearance: 110.2 mL/min (A) (by C-G formula based on SCr of 0.51 mg/dL (L)).  Assessment: Patient started on VTE treatment dose of Lovenox  since 10/18/21 due to  LE doppler on 10/18/21 showed new DVT LLE.  Continues on full dose Lovenox 100 mg SQ BID (='1mg'$ /kg BID).   Scr <1 stable Hgb  10.2, plts 290 stable No bleeding reported.  Goal of Therapy:  Anti-Xa level 0.6-1 units/ml 4hrs after LMWH dose given Monitor platelets by anticoagulation protocol: Yes   Plan:  Continue Lovenox '100mg'$  SQ BID Monitor CBC  Thank you Nicole Cella, RPh Clinical Pharmacist [phone] 10/30/2021 1:56 PM

## 2021-10-30 NOTE — Progress Notes (Signed)
Bowel program completed, dig stim performed x2.. No bowel movement noted at this time. Will pass on in report.

## 2021-10-30 NOTE — Progress Notes (Signed)
Patient ID: [name], male   DOB: 1955/03/25, 66 y.o.   MRN: [id number]   Pt radiation tx changed beginning tomorrow:  Transportation pick up scheduled with [facility]:  9/7 pick up at 1:45pm for appt at 2:15pm 9/8, 9/11, 9/12 pick up at 2:15pm for appt at 2:45pm 9/13-9/20 (no tx over the weekend) pick up at 2:30pm for appt at 3pm  *Family and medical team aware of changes. Wife would like if pt is able to stay through 9/26 as she is working on remodeling the bathroom and working on a ramp. SW informed will discuss in team conference next week. She is aware it is not a safe discharge if there is no ramp in place at time of discharge.   Loralee Pacas, MSW, [place] Office: [phone] Cell: [phone] Fax: [phone]

## 2021-10-30 NOTE — Progress Notes (Signed)
Occupational Therapy Session Note  Patient Details  Name: Jason [name] MRN: [id number] Date of Birth: August 17, 1955  Today's Date: 10/30/2021 OT Individual Time: 1300-1400 OT Individual Time Calculation (min): 60 min    Short Term Goals: Week 2:  OT Short Term Goal 1 (Week 2): Patient will complete self feeding task with set-up using AE for at least 20 minutes before requiring additional assistance from spouse/rehab staff to complete. OT Short Term Goal 2 (Week 2): Pt will perform sitting EOB with min A for RUE simple functional or reaching task  Skilled Therapeutic Interventions/Progress Updates:    OT intervention with focus on SB transfers, sitting balance, and lateral leans to increase pt's ability to actively participate in BADLs. SB transfers with max A+2. Pt recalls correct technique correctly. Pt utilizes correct technique for transitioning from supported sitting (against therapy ball) to upright with BUE support. Pt practiced supported sitting>unsupported sitting X 6 with min A. Pt able to maintain static sitting with BUE support and CGA. Pt also practiced lateral leans to Rt with min A. Min verbal cues for technique. Kinesio tape applied to Lt hand for edema mgmt. Pt returned to room and remained in w/c with all needs within reach. Wife present.   Therapy Documentation Precautions:  Precautions Precautions: Fall, Cervical Precaution Comments: C6 laminectomy, MAP >65 Restrictions Weight Bearing Restrictions: No RUE Weight Bearing: Non weight bearing Pain:  Pt denies pain this afternoon    Therapy/Group: Individual Therapy  Leroy Libman 10/30/2021, 2:19 PM

## 2021-10-30 NOTE — Progress Notes (Signed)
Physical Therapy Session Note  Patient Details  Name: Jason [name] MRN: [id number] Date of Birth: Oct 05, 1955  Today's Date: 10/30/2021 PT Individual Time: 2979-8921 PT Individual Time Calculation (min): 31 min   Short Term Goals: Week 2:  PT Short Term Goal 1 (Week 2): Pt will initiate PWC mobility with assist PT Short Term Goal 2 (Week 2): Pt will tolerate sitting OOB >8 hours PT Short Term Goal 3 (Week 2): Pt will perform slideboard transfers with max A x1 consistently  Skilled Therapeutic Interventions/Progress Updates:      Therapy Documentation Precautions:  Precautions Precautions: Fall, Cervical Precaution Comments: C6 laminectomy, MAP >65 Restrictions Weight Bearing Restrictions: No RUE Weight Bearing: Non weight bearing  Pt received seated in w/c at bedside and agreeable to PT session. Pt without verbal complaints of pain during session. Pt requested to return to bed and was max A for placement and slide board transfer to leveled height bed. Pt required max A x 2 with sit to supine and max A with rolling for re-positioning in bed. PT session with emphasis on rolling to the left. In supine pt performed contralateral reaching with R UE to left in D2 pattern 3 x 6. Pt educated on utilizing momentum and following through with UE to facilitate movement of trunk/body. Pt transitioned to contralateral reaching and trunk rotation with mod A from PT to facilitate rolling to left. In supine pt worked on trunk and postural control by pulling to sit 1 x 10 with mod A from PT. Pt left semi-reclined in bed with all needs in reach and bed alarm on.    Therapy/Group: Individual Therapy  Verl Dicker Verl Dicker PT, DPT  10/30/2021, 7:55 AM

## 2021-10-30 NOTE — Progress Notes (Signed)
Occupational Therapy Session Note  Patient Details  Name: Jason [name] MRN: [id number] Date of Birth: 03-21-1955  Today's Date: 10/30/2021 OT Individual Time: 2951-8841 OT Individual Time Calculation (min): 75 min    Short Term Goals: Week 2:  OT Short Term Goal 1 (Week 2): Patient will complete self feeding task with set-up using AE for at least 20 minutes before requiring additional assistance from spouse/rehab staff to complete. OT Short Term Goal 2 (Week 2): Pt will perform sitting EOB with min A for RUE simple functional or reaching task  Skilled Therapeutic Interventions/Progress Updates:    OT intervention with focus on bed mobility, circle sitting, BLE mgmt at bed level, UB dressing, sitting balance EOB and bed level with HOB elevated, and SB transfer to w/c. UB dressing seated in bed with HOB elevated. Min A for doffing and donning. Pt practiced RLE mgmt using gait belt to loop RUE to assist with positioning. Pt practiced maintaing forward lean while in half circle sitting. Sitting EOB with min A/mod A. Pt educated on use of leg loops. Pt practiced rolling to Lt and using correct biomechanics to assist. Pt practiced task x 4. Recommended bed level bathing/dressing upon discharge. Pt and wife verbalized understanding. SB transfer to w/c with Max A+2. PWC mobility with supervision. Pt remained in [place] with wife present.   Therapy Documentation Precautions:  Precautions Precautions: Fall, Cervical Precaution Comments: C6 laminectomy, MAP >65 Restrictions Weight Bearing Restrictions: No RUE Weight Bearing: Non weight bearing   Pain: Pain Assessment Pain Scale: 0-10 Pain Score: 0-No pain   Therapy/Group: Individual Therapy  Leroy Libman 10/30/2021, 12:09 PM

## 2021-10-30 NOTE — Progress Notes (Signed)
PROGRESS NOTE   Subjective/Complaints:  Had BM with bowel program- large. Wife has done quad coughing some- is helpful.  Spasms have calmed down some- Also talked with wife- no specirfic issues- ahppy with new d/c date of 9/26 ROS:   Pt denies SOB, abd pain, CP, N/V/C/D, and vision changes   Except for HPI  Objective:   No results found.  Recent Labs    10/30/21 0631  WBC 6.4  HGB 10.2*  HCT 31.5*  PLT 290   Recent Labs    10/28/21 0535  NA 136  K 3.8  CL 106  CO2 23  GLUCOSE 87  BUN 22  CREATININE 0.51*  CALCIUM 8.5*     Intake/Output Summary (Last 24 hours) at 10/30/2021 0850 Last data filed at 10/30/2021 0212 Gross per 24 hour  Intake 240 ml  Output 2100 ml  Net -1860 ml        Physical Exam: Vital Signs Blood pressure 124/68, pulse 66, temperature 98.4 F (36.9 C), resp. rate 16, height '5\' 9"'  (1.753 m), weight 99.9 kg, SpO2 97 %.    General: awake, alert, appropriate, seen in room; sitting up in bed; seen wife in hallway; NAD HENT: conjugate gaze; oropharynx moist CV: regular rate; no JVD Pulmonary: CTA B/L; no W/R/R- good air movement- decreased at bases- no coughing today GI: soft, NT, ND, (+)BS Psychiatric: appropriate- a little more flat today Neurological: Ox3 Genitourinary: Light amber urine  Musculoskeletal: L shoulder K tape in place- still subluxed    Cervical back: Neck supple.   Moving all 4 extremities today; prior exams:  RUE- biceps 5/5; WE 4-/5; Triceps 2+/5; Grip 3-/5; FA 2-/5 LUE- biceps 5-/5; WE 4/5; Triceps 2+/5' Grip 3+/5; FA 2-/5 RLE- 0/5 in RLE LLE- HF 1/5; KE 2-/5; DF 1/5; PF 2/5; EHL 1/5  Decreased to light touch from L1 inferiorly; note sacrals not tested RLE spasms decreased; none in LLE  Assessment/Plan: 1. Functional deficits which require 3+ hours per day of interdisciplinary therapy in a comprehensive inpatient rehab setting. Physiatrist is  providing close team supervision and 24 hour management of active medical problems listed below. Physiatrist and rehab team continue to assess barriers to discharge/monitor patient progress toward functional and medical goals  Care Tool:  Bathing    Body parts bathed by patient: Face   Body parts bathed by helper: Right arm, Left arm, Chest, Abdomen, Front perineal area, Buttocks, Left lower leg, Right lower leg, Left upper leg, Right upper leg     Bathing assist Assist Level: Dependent - Patient 0%     Upper Body Dressing/Undressing Upper body dressing   What is the patient wearing?: Pull over shirt    Upper body assist Assist Level: Maximal Assistance - Patient 25 - 49%    Lower Body Dressing/Undressing Lower body dressing      What is the patient wearing?: Pants     Lower body assist Assist for lower body dressing: Total Assistance - Patient < 25%     Toileting Toileting    Toileting assist Assist for toileting: Dependent - Patient 0%     Transfers Chair/bed transfer  Transfers assist  Chair/bed transfer activity did  not occur: Safety/medical concerns  Chair/bed transfer assist level: 2 Helpers     Locomotion Ambulation   Ambulation assist   Ambulation activity did not occur: Safety/medical concerns          Walk 10 feet activity   Assist  Walk 10 feet activity did not occur: Safety/medical concerns        Walk 50 feet activity   Assist Walk 50 feet with 2 turns activity did not occur: Safety/medical concerns         Walk 150 feet activity   Assist Walk 150 feet activity did not occur: Safety/medical concerns         Walk 10 feet on uneven surface  activity   Assist Walk 10 feet on uneven surfaces activity did not occur: Safety/medical concerns         Wheelchair     Assist Is the patient using a wheelchair?: Yes (Pt will use w/c but was not able to get up to chair today) Type of Wheelchair: Manual Wheelchair  activity did not occur: Safety/medical concerns         Wheelchair 50 feet with 2 turns activity    Assist    Wheelchair 50 feet with 2 turns activity did not occur: Safety/medical concerns       Wheelchair 150 feet activity     Assist  Wheelchair 150 feet activity did not occur: Safety/medical concerns       Blood pressure 124/68, pulse 66, temperature 98.4 F (36.9 C), resp. rate 16, height '5\' 9"'  (1.753 m), weight 99.9 kg, SpO2 97 %.  Medical Problem List and Plan: 1. 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  Antithrombotics: -DVT/anticoagulation:  Pharmaceutical: Lovenox.               -antiplatelet therapy: ASA 3. Pain Management: Oxycodone prn. 8/28- pt reports no significant pain- and doesn't want spasticity meds at this time. 9/5- Pain controlled- cont' regimen   4. Mood/Behavior/Sleep: LCSW to follow for evaluation and support.              -antipsychotic agents: N/A 5. Neuropsych/cognition: This patient is capable of making decisions on his own behalf. 6. Skin/Wound Care: Routine pressure relief measures              --monitor incision for healing.  7. Fluids/Electrolytes/Nutrition: Monitor I/O. Check CMET in am.  8. Prostate AdenoCA w/mets: Loading dose Firmagon 240 mg 10/06/21 per Dr. Tasia Catchings --Charlesetta Garibaldi changed to [facility] due to insurance coverage--next dose around 09/13. --Simulation for XRT 08/31 at 2 pm at [facility] 8/31- getting simulation for XRT and consult today 9/2- XRT to start 9/12 9/6- has appt 10/11 for Eliguard and MD f/u.  9. Dysphagia: Continue D3 w/chopped meats and assist at meals. Aspiration precautions.  10. Neurogenic bladder: continue foley for now  8/31- pt doesn't  want to remove to  see if can void; or to do in/out caths- thinks too much stress on family.   9/3- still has some brownish  urine- see through. 9/6- Urine clear/yellow again  11. Neurogenic bowel: Will get KUB to determine stool  burden --Colace in am with suppository after supper.  8/25- had good results with bowel program 8/27- good results with bowel program yesterday 8/28- having bowel accidents- and BM with bowel program last 3 nights-  8/29- 2 small BM's last night after bowel program- con't regimen 9/2-9/3 Having results with bowel program nightly  9/5- Didn't have results last night- but had before- will con't to monitor 9/6- had large BM after bowel regimen done 12. Sepsis/Likely aspiration PNA: Now on Augmentin--antibiotic D#5/10. BC pend-->neg so far.              --Follow up CXR in am.              --pulmonary hygiene for atelectasis/Asymmetric volume loss on right.  13. Abnormal LFTs: Improved overall except for elevation in Aphos--315. Will recheck in am  8/25- Alk phos up to 457; AST 62 and ALT 96- except for alk phos, they are heading downwards- con't to monitor weekly.  14. ABLA: Recheck in am  -stable 8.7 HGB 8/26, 8/30, 9/3   15. Leucocytosis: Recheck in am. Monitor for fevers/other signs of infection.    8/25- WBC 17.1k- on Augmentin- will recheck labs in AM  -WBC improved to 15.6 10/19/21  8/28- Down to 10.8k- con't to monitor  8/31- WBC 9.5k- doing better            9/3 - 7.9 16. Hypotension: Will continue midodrine for now.    8/25- BP better 120s/70s- con't regimen 17. DVT left gastrocnemius vein  -8/26 Noted Lovenox was increased 8/25 to 146m BID, Distal DVT, will recheck in 1-2 weeks, compression stockings,   9/6- needs to go home on Tx lovenox- will need a minimum of 3 months due to DVT- cannot switch to Eliquis, etc since cancer related DVTs need Lovenox!  18. Spasticity  8/28- pt doesn't want spasticity meds- isn't painful or annoying.   9/6- Spasticity  somewhat better today 19. Dispo  8/28- will d/c staples and [facility]- need to see about FMills Koller-supposed to be given 9/12- Oncology to handle it- L/M/via PA about this.  8/30- per pt, they aren't using Firmagen- are using other product- will d/w Oncology more 9/2- XRT starts  9/12- 9/26  Will have pt seen by W/C rep- and justify - will likely need power w/c with ROHO cushion- will add details as we know them  I spent a total of  38  minutes on total care today- >50% coordination of care- due to d/w pt and seperately with wife about pt care.    LOS: 13 days A FACE TO FACE EVALUATION WAS PERFORMED  Kamarius Buckbee 10/30/2021, 8:50 AM

## 2021-10-31 ENCOUNTER — Other Ambulatory Visit: Payer: Self-pay

## 2021-10-31 ENCOUNTER — Ambulatory Visit
Admit: 2021-10-31 | Discharge: 2021-10-31 | Disposition: A | Discharge status: Home / Self Care | Payer: BC Managed Care – PPO | Attending: Radiation Oncology | Admitting: Radiation Oncology

## 2021-10-31 DIAGNOSIS — C490 Malignant neoplasm of connective and soft tissue of head, face and neck: Secondary | ICD-10-CM | POA: Diagnosis not present

## 2021-10-31 DIAGNOSIS — Z510 Encounter for antineoplastic radiation therapy: Secondary | ICD-10-CM | POA: Diagnosis not present

## 2021-10-31 DIAGNOSIS — Z79899 Other long term (current) drug therapy: Secondary | ICD-10-CM | POA: Diagnosis not present

## 2021-10-31 DIAGNOSIS — K592 Neurogenic bowel, not elsewhere classified: Secondary | ICD-10-CM

## 2021-10-31 DIAGNOSIS — C7931 Secondary malignant neoplasm of brain: Secondary | ICD-10-CM | POA: Diagnosis not present

## 2021-10-31 DIAGNOSIS — R252 Cramp and spasm: Secondary | ICD-10-CM

## 2021-10-31 DIAGNOSIS — E785 Hyperlipidemia, unspecified: Secondary | ICD-10-CM | POA: Diagnosis not present

## 2021-10-31 DIAGNOSIS — I10 Essential (primary) hypertension: Secondary | ICD-10-CM | POA: Diagnosis not present

## 2021-10-31 DIAGNOSIS — C61 Malignant neoplasm of prostate: Secondary | ICD-10-CM | POA: Diagnosis not present

## 2021-10-31 DIAGNOSIS — C7951 Secondary malignant neoplasm of bone: Secondary | ICD-10-CM | POA: Diagnosis not present

## 2021-10-31 LAB — RAD ONC ARIA SESSION SUMMARY
Course Elapsed Days: 0
Plan Fractions Treated to Date: 1
Plan Prescribed Dose Per Fraction: 3 Gy
Plan Total Fractions Prescribed: 10
Plan Total Prescribed Dose: 30 Gy
Reference Point Dosage Given to Date: 3 Gy
Reference Point Session Dosage Given: 3 Gy
Session Number: 1

## 2021-10-31 MED ORDER — FLEET ENEMA 7-19 GM/118ML RE ENEM
1.0000 | ENEMA | Freq: Every day | RECTAL | Status: DC
  Administered 2021-10-31 – 2021-11-04 (×5): 1 via RECTAL
  Filled 2021-10-31 (×5): qty 1

## 2021-10-31 NOTE — Progress Notes (Signed)
Occupational Therapy Weekly Progress Note  Patient Details  Name: Jason [name] MRN: [id number] Date of Birth: 1956-01-18  Beginning of progress report period: October 25, 2021 End of progress report period: October 31, 2021  Patient has met 1 of 2 short term goals.  Pt is making slow but steady progress with self feeding, bed mobility, SB tranfsers, and sitting balance. Pt requires max A+1 for rolling R/L in bed using bed rails. Pt requires tot A+2 for supine>sit EOB and max A for sitting balance EOB in preparation for SB tranfsers. Pt completes UB dressing at bed level with HOB elevated with max A. LB dressing at bed level with tot A. SB tranfsers with max A+2.   Patient continues to demonstrate the following deficits: muscle weakness, muscle joint tightness, and muscle paralysis, decreased cardiorespiratoy endurance, abnormal tone, unbalanced muscle activation, decreased coordination, and decreased motor planning, and decreased sitting balance, decreased standing balance, decreased postural control, and decreased balance strategies and therefore will continue to benefit from skilled OT intervention to enhance overall performance with BADL, iADL, Vocation, and Reduce care partner burden.  Patient progressing toward long term goals..  Continue plan of care.  OT Short Term Goals Week 2:  OT Short Term Goal 1 (Week 2): Patient will complete self feeding task with set-up using AE for at least 20 minutes before requiring additional assistance from spouse/rehab staff to complete. OT Short Term Goal 1 - Progress (Week 2): Met OT Short Term Goal 2 (Week 2): Pt will perform sitting EOB with min A for RUE simple functional or reaching task OT Short Term Goal 2 - Progress (Week 2): Progressing toward goal Week 3:  OT Short Term Goal 1 (Week 3): Pt will perform sitting EOB/EOM with min A for RUE simple functional or reaching task OT Short Term Goal 2 (Week 3): Pt will doff/don pull over shirt with min  A at bed level with HOB elevated OT Short Term Goal 3 (Week 3): Pt will thread BLE into pants with max A seated in half circle sitting at bed level   Leroy Libman 10/31/2021, 6:56 AM

## 2021-10-31 NOTE — Progress Notes (Signed)
Physical Therapy Session Note  Patient Details  Name: Jason [name] MRN: [id number] Date of Birth: 04-20-1955  Today's Date: 10/31/2021 PT Individual Time: 1010-1120 PT Individual Time Calculation (min): 70 min   Short Term Goals: Week 2:  PT Short Term Goal 1 (Week 2): Pt will initiate PWC mobility with assist PT Short Term Goal 2 (Week 2): Pt will tolerate sitting OOB >8 hours PT Short Term Goal 3 (Week 2): Pt will perform slideboard transfers with max A x1 consistently  Skilled Therapeutic Interventions/Progress Updates: Pt presented in [place] agreeable to therapy. Pt denies pain during session. Session focused on sitting balance and safety with PWC. Pt navigated to rehab gym with supervision. Pt required total A for Slide board set up and was maxA x 1 with +2 for safety to mat. Pt then participated in sitting balance activities moving hands from lap to mat. Pt was able to maintain hands on lap and maintain sitting balance with CGA up to 10 sec bouts however pt required minA when hands were placed on mat.  Pt was able to use BUE to pull self forward however unable to stabilize and would have anterior LOB. Pt also participated in leaning back against tech for support and using BUE and breathing pull self up to midline but required assistance from PTA to maintain. Pt also participated in lateral leans to R when he was able to descend to with modA but required maxA to return to upright position. Pt was then total A for Slide board set up and transferred to [facility] in same manner as prior. Pt then navigated to day room and participated in navigation around large and small obstacles for PWC safety. Pt was able to safely navigate permanent fixtures as well as "immediate" obstacles (PTA throwing pillow approx 61f in front of PWC). Pt also demonstrated setting for pressure relief and performed 2 min pressure relief prior to returning to room. Pt was then handed of to [name] in room with needs met.       Therapy Documentation Precautions:  Precautions Precautions: Fall, Cervical Precaution Comments: C6 laminectomy, MAP >65 Restrictions Weight Bearing Restrictions: Yes RUE Weight Bearing: Weight bearing as tolerated General:   Vital Signs: Therapy Vitals Temp: 98.2 F (36.8 C) Temp Source: Oral Pulse Rate: 77 Resp: 18 BP: (!) 147/89 Patient Position (if appropriate): Sitting Oxygen Therapy SpO2: 97 % O2 Device: Room Air Pain:   Mobility:   Locomotion :    Trunk/Postural Assessment :    Balance:   Exercises:   Other Treatments:      Therapy/Group: Individual Therapy  Merian Wroe 10/31/2021, 4:12 PM

## 2021-10-31 NOTE — Progress Notes (Addendum)
PROGRESS NOTE   Subjective/Complaints:  Has PM bowel program but no movement until around 0400 today. Asked about blood tinged urine in foley  ROS: Patient denies fever, rash, sore throat, blurred vision, dizziness, nausea, vomiting, diarrhea, cough, shortness of breath or chest pain,  headache, or mood change.   Objective:   No results found.  Recent Labs    10/30/21 0631  WBC 6.4  HGB 10.2*  HCT 31.5*  PLT 290   No results for input(s): "NA", "K", "CL", "CO2", "GLUCOSE", "BUN", "CREATININE", "CALCIUM" in the last 72 hours.    Intake/Output Summary (Last 24 hours) at 10/31/2021 1104 Last data filed at 10/31/2021 0758 Gross per 24 hour  Intake 596 ml  Output 750 ml  Net -154 ml        Physical Exam: Vital Signs Blood pressure 122/61, pulse 65, temperature 97.8 F (36.6 C), resp. rate 16, height _0  (1.753 m), weight 99.9 kg, SpO2 95 %.    Constitutional: No distress . Vital signs reviewed. HEENT: NCAT, EOMI, oral membranes moist Neck: supple Cardiovascular: RRR without murmur. No JVD    Respiratory/Chest: CTA Bilaterally without wheezes or rales. Normal effort    GI/Abdomen: BS +, non-tender, non-distended Ext: no clubbing, cyanosis, or edema Psych: pleasant but a little flat, engages  Neurological: Ox3 Genitourinary: Light amber urine  Musculoskeletal: L shoulder K tape in place- still subluxed ~1"    Cervical back: Neck supple.   Moving all 4 extremities today; prior exams:  RUE- biceps 5/5; WE 4-/5; Triceps 2+/5; Grip 3-/5; FA 2-/5 LUE- biceps 5-/5; WE 4/5; Triceps 2+/5' Grip 3+/5; FA 2-/5 RLE- 1to 1+/5 HE, KE and trace at anke LLE- HF 1+/5; KE -/5; DF 1/5; PF 2/5; EHL 1/5  Decreased to light touch from L1 inferiorly; note sacrals not tested RLE spasms decreased; none in LLE  Assessment/Plan: 1. Functional deficits which require 3+ hours per day of interdisciplinary therapy in a comprehensive  inpatient rehab setting. Physiatrist is providing close team supervision and 24 hour management of active medical problems listed below. Physiatrist and rehab team continue to assess barriers to discharge/monitor patient progress toward functional and medical goals  Care Tool:  Bathing    Body parts bathed by patient: Face   Body parts bathed by helper: Right arm, Left arm, Chest, Abdomen, Front perineal area, Buttocks, Left lower leg, Right lower leg, Left upper leg, Right upper leg     Bathing assist Assist Level: Dependent - Patient 0%     Upper Body Dressing/Undressing Upper body dressing   What is the patient wearing?: Pull over shirt    Upper body assist Assist Level: Moderate Assistance - Patient 50 - 74%    Lower Body Dressing/Undressing Lower body dressing      What is the patient wearing?: Pants     Lower body assist Assist for lower body dressing: Total Assistance - Patient < 25%     Toileting Toileting    Toileting assist Assist for toileting: Dependent - Patient 0%     Transfers Chair/bed transfer  Transfers assist  Chair/bed transfer activity did not occur: Safety/medical concerns  Chair/bed transfer assist level: 2 Helpers  Locomotion Ambulation   Ambulation assist   Ambulation activity did not occur: Safety/medical concerns          Walk 10 feet activity   Assist  Walk 10 feet activity did not occur: Safety/medical concerns        Walk 50 feet activity   Assist Walk 50 feet with 2 turns activity did not occur: Safety/medical concerns         Walk 150 feet activity   Assist Walk 150 feet activity did not occur: Safety/medical concerns         Walk 10 feet on uneven surface  activity   Assist Walk 10 feet on uneven surfaces activity did not occur: Safety/medical concerns         Wheelchair     Assist Is the patient using a wheelchair?: Yes (Pt will use w/c but was not able to get up to chair  today) Type of Wheelchair: Manual Wheelchair activity did not occur: Safety/medical concerns         Wheelchair 50 feet with 2 turns activity    Assist    Wheelchair 50 feet with 2 turns activity did not occur: Safety/medical concerns       Wheelchair 150 feet activity     Assist  Wheelchair 150 feet activity did not occur: Safety/medical concerns       Blood pressure 122/61, pulse 65, temperature 97.8 F (36.6 C), resp. rate 16, height _0  (1.753 m), weight 99.9 kg, SpO2 95 %.  Medical Problem List and Plan: 1. 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  Antithrombotics: -DVT/anticoagulation:  Pharmaceutical: Lovenox.               -antiplatelet therapy: ASA 3. Pain Management: Oxycodone prn. 8/28- pt reports no significant pain- and doesn't want spasticity meds at this time. 9/7- Pain controlled- cont' regimen   4. Mood/Behavior/Sleep: LCSW to follow for evaluation and support.              -antipsychotic agents: N/A 5. Neuropsych/cognition: This patient is capable of making decisions on his own behalf. 6. Skin/Wound Care: Routine pressure relief measures              --monitor incision for healing.  7. Fluids/Electrolytes/Nutrition: Monitor I/O. Check CMET in am.  8. Prostate AdenoCA w/mets: Loading dose Firmagon 240 mg 10/06/21 per Dr. Tasia Catchings --Charlesetta Garibaldi changed to [facility] due to insurance coverage--next dose around 09/13. --Simulation for XRT 08/31 at 2 pm at [facility] 8/31- getting simulation for XRT and consult today 9/2- XRT to start 9/12 9/6- has appt 10/11 for Eliguard and MD f/u.  9. Dysphagia: Continue D3 w/chopped meats and assist at meals. Aspiration precautions.  10. Neurogenic  bladder: continue foley for now  8/31- pt doesn't want to remove to  see if can void; or to do in/out caths- thinks too much stress on family.   9/3- still has some brownish  urine- see through. 9/7 urine generally clear. May have some periodic blood d/t anticoagulation 11. Neurogenic bowel: Will get KUB to determine stool burden --Colace in am with suppository after supper.  8/25- had  good results with bowel program 8/27- good results with bowel program yesterday 8/28- having bowel accidents- and BM with bowel program last 3 nights-  8/29- 2 small BM's last night after bowel program- con't regimen 9/2-9/3 Having results with bowel program nightly  9/5- Didn't have results last night- but had before- will con't to monitor 9/7 bowel program results inconsistent--will try fleet enema tonight to see if we can get a little more complete emptying 12. Sepsis/Likely aspiration PNA: Now on Augmentin--antibiotic D#5/10. BC pend-->neg so far.              --Follow up CXR in am.              --pulmonary hygiene for atelectasis/Asymmetric volume loss on right.  13. Abnormal LFTs: Improved overall except for elevation in Aphos--315. Will recheck in am  8/25- Alk phos up to 457; AST 62 and ALT 96- except for alk phos, they are heading downwards- con't to monitor weekly.  14. ABLA: Recheck in am  -stable 8.7 HGB 8/26, 8/30, 9/3   15. Leucocytosis: Recheck in am. Monitor for fevers/other signs of infection.    8/25- WBC 17.1k- on Augmentin- will recheck labs in AM  -WBC improved to 15.6 10/19/21  8/28- Down to 10.8k- con't to monitor  8/31- WBC 9.5k- doing better            9/3 - 7.9 16. Hypotension: Will continue midodrine for now.    8/25- BP better 120s/70s- con't regimen 17. DVT left gastrocnemius vein  -8/26 Noted Lovenox was increased 8/25 to 182m BID, Distal DVT, will recheck in 1-2 weeks, compression stockings,   9/6- needs to go home on Tx lovenox- will need a minimum of 3 months due to DVT-  cannot switch to Eliquis, etc since cancer related DVTs need Lovenox!  18. Spasticity  8/28- pt doesn't want spasticity meds- isn't painful or annoying.   9/6- Spasticity somewhat better today 19. Dispo  8/28- will d[facility]c staples [facility]- need to see about FMills Koller-supposed to be given 9/12- Oncology to handle it- L/M/via PA about this.  8/30- per pt, they aren't using Firmagen- are using other product- will d/w Oncology more 9/2- XRT starts  9/12- 9/26   - will likely need power w/c with ROHO cushion- will add details as we know them    LOS: 14 days A FACE TO FNorphlet9/08/2021, 11:04 AM

## 2021-10-31 NOTE — Progress Notes (Signed)
Occupational Therapy Session Note  Patient Details  Name: Jason [name] MRN: [id number] Date of Birth: 1955-07-25  Today's Date: 10/31/2021 OT Individual Time: 2878-6767 OT Individual Time Calculation (min): 70 min    Short Term Goals: Week 3:  OT Short Term Goal 1 (Week 3): Pt will perform sitting EOB/EOM with min A for RUE simple functional or reaching task OT Short Term Goal 2 (Week 3): Pt will doff/don pull over shirt with min A at bed level with HOB elevated OT Short Term Goal 3 (Week 3): Pt will thread BLE into pants with max A seated in half circle sitting at bed level  Skilled Therapeutic Interventions/Progress Updates:    Pt resting in bed upon arrival. OT intervention with focus on UB/LB dressing at bed level. Doffing/donning pullover shirt with HOB elevated to 62*. Pt required min A for doffing shirt and mod A/max A for donning shirt. OTA threaded pants over BLE and pt initiated pulling up to Bil knees. Rolling R/L in bed with max A using bed rails to hook and maintain position while OTA pulled pants over hips. Reviewed technique for supine>sit EOB. Supine>sit EOB with tot A. SB tranfser EOB>PWC with max A+2. Pt remained in [place] with all needs within reach.   Therapy Documentation Precautions:  Precautions Precautions: Fall, Cervical Precaution Comments: C6 laminectomy, MAP >65 Restrictions Weight Bearing Restrictions: No RUE Weight Bearing: Non weight bearing Pain:  Pt reports 1/10 pain in Lt shoulder; repositioned  Therapy/Group: Individual Therapy  Leroy Libman 10/31/2021, 9:33 AM

## 2021-10-31 NOTE — Progress Notes (Signed)
Occupational Therapy Session Note  Patient Details  Name: Jason [name] MRN: [id number] Date of Birth: 07/16/1955  Today's Date: 10/31/2021 OT Individual Time: 1122-1200 OT Individual Time Calculation (min): 38 min    Short Term Goals: Week 3:  OT Short Term Goal 1 (Week 3): Pt will perform sitting EOB/EOM with min A for RUE simple functional or reaching task OT Short Term Goal 2 (Week 3): Pt will doff/don pull over shirt with min A at bed level with HOB elevated OT Short Term Goal 3 (Week 3): Pt will thread BLE into pants with max A seated in half circle sitting at bed level  Skilled Therapeutic Interventions/Progress Updates:    Pt received in wc with spouse present. Pt agreeable to therapy and the plans for this session to focus on sit balance, trunk alignment and L sh AROM and RUE strength. Pt remained in wc during session. Initially worked on scapular mobilizations with limited range in L shoulder.  For L sh worked on A/arom with therapist supporting arm and then with slide board on pt's lap in long position and pt moving arm back and forth. Pt able to push sh about 20 degrees and therapist extended to 45, pt then worked on actively pulling arm back to facilitate sh retraction for postural control.   Demonstrated these  exercises to his wife so she could help him do them when he has down time.  R triceps ext with resistance applied to increase elbow ext strength for using slide board.  Sh flexion AROM fairly functional with his back support in wc but later when pt had less support only able to raise arm to 80 degrees.  To facilitate ant pelvic tilt and upright posture needed for efficient transfers, placed long sheet around low back.  Pt cued to look up at picture of his cat focusing on "lifting his sternum" while therapist pulled on sheet to bring pelvis into more of a neutral position.  Repeated this movement 8x with a great deal of force needed to achieve this position due to severely  limited trunk strength. Then used sheet to help work on gentle trunk rotation and lateral leans to challenge pt to find his center of gravity. He needs significant support from sheet around his waist to avoid losing his balance.  Pt resting in wc with all needs met.   Therapy Documentation Precautions:  Precautions Precautions: Fall, Cervical Precaution Comments: C6 laminectomy, MAP >65 Restrictions Weight Bearing Restrictions: No    Pain: pt only has minimal pain in left shoulder       Therapy/Group: Individual Therapy  [name],[name] 10/31/2021, 12:48 PM 

## 2021-11-01 ENCOUNTER — Ambulatory Visit
Admit: 2021-11-01 | Discharge: 2021-11-01 | Disposition: A | Discharge status: Home / Self Care | Payer: BC Managed Care – PPO | Attending: Radiation Oncology | Admitting: Radiation Oncology

## 2021-11-01 ENCOUNTER — Other Ambulatory Visit: Payer: Self-pay

## 2021-11-01 DIAGNOSIS — I10 Essential (primary) hypertension: Secondary | ICD-10-CM | POA: Diagnosis not present

## 2021-11-01 DIAGNOSIS — C61 Malignant neoplasm of prostate: Secondary | ICD-10-CM | POA: Diagnosis not present

## 2021-11-01 DIAGNOSIS — Z510 Encounter for antineoplastic radiation therapy: Secondary | ICD-10-CM | POA: Diagnosis not present

## 2021-11-01 DIAGNOSIS — C490 Malignant neoplasm of connective and soft tissue of head, face and neck: Secondary | ICD-10-CM | POA: Diagnosis not present

## 2021-11-01 DIAGNOSIS — C7951 Secondary malignant neoplasm of bone: Secondary | ICD-10-CM | POA: Diagnosis not present

## 2021-11-01 DIAGNOSIS — Z79899 Other long term (current) drug therapy: Secondary | ICD-10-CM | POA: Diagnosis not present

## 2021-11-01 DIAGNOSIS — E785 Hyperlipidemia, unspecified: Secondary | ICD-10-CM | POA: Diagnosis not present

## 2021-11-01 DIAGNOSIS — C7931 Secondary malignant neoplasm of brain: Secondary | ICD-10-CM | POA: Diagnosis not present

## 2021-11-01 LAB — RAD ONC ARIA SESSION SUMMARY
Course Elapsed Days: 1
Plan Fractions Treated to Date: 2
Plan Prescribed Dose Per Fraction: 3 Gy
Plan Total Fractions Prescribed: 10
Plan Total Prescribed Dose: 30 Gy
Reference Point Dosage Given to Date: 6 Gy
Reference Point Session Dosage Given: 3 Gy
Session Number: 2

## 2021-11-01 LAB — HEPARIN ANTI-XA: Heparin LMW: 0.95 IU/mL

## 2021-11-01 NOTE — Progress Notes (Signed)
Occupational Therapy Session Note  Patient Details  Name: Jason [name] MRN: [id number] Date of Birth: [birth year]  Today's Date: 11/01/2021 OT Individual Time: 1045-1200 OT Individual Time Calculation (min): 75 min    Short Term Goals: Week 2:  OT Short Term Goal 1 (Week 2): Patient will complete self feeding task with set-up using AE for at least 20 minutes before requiring additional assistance from spouse/rehab staff to complete. OT Short Term Goal 1 - Progress (Week 2): Met OT Short Term Goal 2 (Week 2): Pt will perform sitting EOB with min A for RUE simple functional or reaching task OT Short Term Goal 2 - Progress (Week 2): Progressing toward goal  Skilled Therapeutic Interventions/Progress Updates:    Upon OT arrival, pt seated in [name] reporting no pain and is agreeable to OT treatment. Treatment intervention with a focus on w/c navigation, use of w/c, item retrieval during IADLs, and UE strengthening. Pt transports himself to rehab apartment with SBA and opens/closes microwave, fridge, cabinets/drawers using B UE as able and retrieves items from fridge, pantry, cabinets, and drawers including, jam, silverware, plate, cup and fake fruit. Pt requires Min assist as needed for item retrieval secondary to weakness in UE and core. Pt educated on controls on w/c to improve independence during item retrieval.  Pt bumps into surfaces 3 times during treatment session. Pt transports himself back to his room via [place] and SBA and sits in w/c to retrieve plastic beads from yellow theraputty using B UE. Pt able to retrieve all 10 beads in adequate amount of time. Pt was left in w/c at end of session with all needs met and spouse at bedside.   Therapy Documentation Precautions:  Precautions Precautions: Fall, Cervical Precaution Comments: C6 laminectomy, MAP >65 Restrictions Weight Bearing Restrictions: Yes RUE Weight Bearing: Weight bearing as tolerated   Therapy/Group: Individual  Therapy  [name] 11/01/2021, 1:22 PM

## 2021-11-01 NOTE — Progress Notes (Signed)
Physical Therapy Weekly Progress Note  Patient Details  Name: Jason [name] MRN: [id number] Date of Birth: 12-03-1955  Beginning of progress report period: October 25, 2021 End of progress report period: November 01, 2021  Today's Date: 11/01/2021 PT Individual Time:  -      Patient has met 2 of 3 short term goals.  Pt has made slow but stedy gains towards goals. Pt continues to require maxA for bed moblity and can be maxA x 1 for slide board transfers however continues to require +2 for safety due to poor sitting balance. Sitting balance has made some improvements over past week with pt able to maintain static sitting with hands on lap or hands on mat for periods of 30 sec to 1 min. PWC has been introduced to pt and wife with pt demonstrating good safety with navigation in [place]. Wife has been present during in room therapy sessions and has been educated in ROM and stretching for pt. Education is ongoing.    Patient continues to demonstrate the following deficits muscle weakness and muscle paralysis and impaired timing and sequencing, abnormal tone, unbalanced muscle activation, and decreased coordination and therefore will continue to benefit from skilled PT intervention to increase functional independence with mobility.  Patient {LTG progression:3041653}.  {plan of YOVZ:8588502}  PT Short Term Goals Week 2:  PT Short Term Goal 1 (Week 2): Pt will initiate PWC mobility with assist PT Short Term Goal 1 - Progress (Week 2): Met PT Short Term Goal 2 (Week 2): Pt will tolerate sitting OOB >8 hours PT Short Term Goal 2 - Progress (Week 2): Met PT Short Term Goal 3 (Week 2): Pt will perform slideboard transfers with max A x1 consistently PT Short Term Goal 3 - Progress (Week 2): Progressing toward goal Week 3:     Skilled Therapeutic Interventions/Progress Updates:      Therapy Documentation Precautions:  Precautions Precautions: Fall, Cervical Precaution Comments: C6 laminectomy, MAP  >65 Restrictions Weight Bearing Restrictions: Yes RUE Weight Bearing: Weight bearing as tolerated General:   Vital Signs: Therapy Vitals Temp: (!) 96.6 F (35.9 C) Temp Source: Oral Pulse Rate: 72 Resp: 18 BP: 133/80 Patient Position (if appropriate): Sitting Oxygen Therapy SpO2: 97 % O2 Device: Room Air Pain:   Vision/Perception     Mobility:   Locomotion :    Trunk/Postural Assessment :    Balance:   Exercises:   Other Treatments:     Therapy/Group: Individual Therapy  Rosita DeChalus 11/01/2021, 4:40 PM

## 2021-11-01 NOTE — Progress Notes (Signed)
Physical Therapy Session Note  Patient Details  Name: Jason [name] MRN: [id number] Date of Birth: 1955-04-21  Today's Date: 11/01/2021 PT Individual Time: 0915-1015 PT Individual Time Calculation (min): 60 min   Short Term Goals: Week 2:  PT Short Term Goal 1 (Week 2): Pt will initiate PWC mobility with assist PT Short Term Goal 2 (Week 2): Pt will tolerate sitting OOB >8 hours PT Short Term Goal 3 (Week 2): Pt will perform slideboard transfers with max A x1 consistently  Skilled Therapeutic Interventions/Progress Updates: Pt presented in bed with wife present agreeable to therapy. Pt denies pain but does state some increased fatigue due to poor sleep last night. Pt performed roll to L with maxA but pt able to reach bed rail and hook over bed rail. Performed supine to sit maxA. Total A for slide board set up and maxA x 2 to transfer to [facility]. Pt's wife with some questions regarding shower chair which PTA assisting in locating online. Pt then navigated to day room in [facility] with supervision. Pt was able to safely navigate along high/low mat with supervision and minimal cues. Total A Slide board set up and performed Slide board transfer to high/low mat maxA x 1 with +2 providing minA for guarding. At mat pt participated in sitting balance activities with pt pulling up to midline from posterior lean. Pt was able to place B hands on lap and once found midline was able to maintain static sitting for ~1 min! Pt also was able to transfer B hands to mat with LOB posteriorly requiring minA for correction. Once pt finding balance with hands on mat pt was able to maintain balance for ~30 sec before LOB. Pt also participated in lateral leans to R with minA to lower and modA to push back to midline. Pt also participated in LE knee flexion/extension with semi-inflated ball placed under foot. Pt was able to perform x 5 on RLE with AA and facilitation for hamstrings on RLE but able to perform x 10 with LLE. Pt with noted  increased fatigue but with total set up was able to perform Slide board transfer to [facility] with maxA x 1 and minA from +2 support. Pt then participated in [facility] navigation with obstacles placed in front of pt rapidly. Pt was able to safely navigate without hitting obstacles. Pt returned to room at end of session and remained in [facility] with call bell within reach and wife present.      Therapy Documentation Precautions:  Precautions Precautions: Fall, Cervical Precaution Comments: C6 laminectomy, MAP >65 Restrictions Weight Bearing Restrictions: Yes RUE Weight Bearing: Weight bearing as tolerated General:   Vital Signs: Oxygen Therapy SpO2: 100 % O2 Device: Room Air Pain:   Mobility:   Locomotion :    Trunk/Postural Assessment :    Balance:   Exercises:   Other Treatments:      Therapy/Group: Individual Therapy  Kazuko Clemence 11/01/2021, 11:07 AM

## 2021-11-01 NOTE — Progress Notes (Signed)
PROGRESS NOTE   Subjective/Complaints:  Had good results with fleet enema as part of bowel program last night. Had large BM at 2130. Slept fairly well.   ROS: Patient denies fever, rash, sore throat, blurred vision, dizziness, nausea, vomiting, diarrhea, cough, shortness of breath or chest pain,  headache, or mood change.   Objective:   No results found.  Recent Labs    10/30/21 0631  WBC 6.4  HGB 10.2*  HCT 31.5*  PLT 290   No results for input(s): "NA", "K", "CL", "CO2", "GLUCOSE", "BUN", "CREATININE", "CALCIUM" in the last 72 hours.    Intake/Output Summary (Last 24 hours) at 11/01/2021 1220 Last data filed at 11/01/2021 0743 Gross per 24 hour  Intake 948 ml  Output 2000 ml  Net -1052 ml        Physical Exam: Vital Signs Blood pressure 130/73, pulse 66, temperature 98.3 F (36.8 C), temperature source Oral, resp. rate 16, height '5\' 9"'  (1.753 m), weight 99.9 kg, SpO2 100 %.    Constitutional: No distress . Vital signs reviewed. HEENT: NCAT, EOMI, oral membranes moist Neck: supple Cardiovascular: RRR without murmur. No JVD    Respiratory/Chest: CTA Bilaterally without wheezes or rales. Normal effort    GI/Abdomen: BS +, non-tender, non-distended Ext: no clubbing, cyanosis, or edema Psych: pleasant and cooperative. Talkative and in good spirits Neurological: Ox3 Genitourinary: Golden urine in bag, sides of catheter tubing stained red  Musculoskeletal: L shoulder K tape in place- shoulder still subluxed ~1"    Cervical back: Neck supple.   Moving all 4 extremities today; prior exams:  RUE- biceps 5/5; WE 4-/5; Triceps 2+/5; Grip 3-/5; FA 2-/5 LUE- biceps 5-/5; WE 4/5; Triceps 2+/5' Grip 3+/5; FA 2-/5 RLE- 1to 1+/5 HE, KE and trace at anke LLE- HF 1+/5; KE -/5; DF 1/5; PF 2/5; EHL 1/5  Decreased to light touch from L1 inferiorly; note sacrals not tested RLE spasms decreased; none in  LLE  Assessment/Plan: 1. Functional deficits which require 3+ hours per day of interdisciplinary therapy in a comprehensive inpatient rehab setting. Physiatrist is providing close team supervision and 24 hour management of active medical problems listed below. Physiatrist and rehab team continue to assess barriers to discharge/monitor patient progress toward functional and medical goals  Care Tool:  Bathing    Body parts bathed by patient: Face   Body parts bathed by helper: Right arm, Left arm, Chest, Abdomen, Front perineal area, Buttocks, Left lower leg, Right lower leg, Left upper leg, Right upper leg     Bathing assist Assist Level: Dependent - Patient 0%     Upper Body Dressing/Undressing Upper body dressing   What is the patient wearing?: Pull over shirt    Upper body assist Assist Level: Moderate Assistance - Patient 50 - 74%    Lower Body Dressing/Undressing Lower body dressing      What is the patient wearing?: Pants     Lower body assist Assist for lower body dressing: Total Assistance - Patient < 25%     Toileting Toileting    Toileting assist Assist for toileting: Dependent - Patient 0%     Transfers Chair/bed transfer  Transfers assist  Chair/bed  transfer activity did not occur: Safety/medical concerns  Chair/bed transfer assist level: 2 Helpers     Locomotion Ambulation   Ambulation assist   Ambulation activity did not occur: Safety/medical concerns          Walk 10 feet activity   Assist  Walk 10 feet activity did not occur: Safety/medical concerns        Walk 50 feet activity   Assist Walk 50 feet with 2 turns activity did not occur: Safety/medical concerns         Walk 150 feet activity   Assist Walk 150 feet activity did not occur: Safety/medical concerns         Walk 10 feet on uneven surface  activity   Assist Walk 10 feet on uneven surfaces activity did not occur: Safety/medical concerns          Wheelchair     Assist Is the patient using a wheelchair?: Yes (Pt will use w/c but was not able to get up to chair today) Type of Wheelchair: Manual Wheelchair activity did not occur: Safety/medical concerns         Wheelchair 50 feet with 2 turns activity    Assist    Wheelchair 50 feet with 2 turns activity did not occur: Safety/medical concerns       Wheelchair 150 feet activity     Assist  Wheelchair 150 feet activity did not occur: Safety/medical concerns       Blood pressure 130/73, pulse 66, temperature 98.3 F (36.8 C), temperature source Oral, resp. rate 16, height '5\' 9"'  (1.753 m), weight 99.9 kg, SpO2 100 %.  Medical Problem List and Plan: 1. 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  Antithrombotics: -DVT/anticoagulation:  Pharmaceutical: Lovenox.               -antiplatelet therapy: ASA 3. Pain Management: Oxycodone prn. 8/28- pt reports no significant pain- and doesn't want spasticity meds at this time. 9/7-8 - Pain controlled- cont' regimen   4. Mood/Behavior/Sleep: LCSW to follow for evaluation and support.              -antipsychotic agents: N/A 5. Neuropsych/cognition: This patient is capable of making decisions on his own behalf. 6. Skin/Wound Care: Routine pressure relief measures              --monitor incision for healing.  7. Fluids/Electrolytes/Nutrition: Monitor I/O. Check CMET in am.  8. Prostate AdenoCA w/mets: Loading dose Firmagon 240 mg 10/06/21 per Dr. Tasia Catchings --Charlesetta Garibaldi changed to [facility] due to insurance coverage--next dose around 09/13. --Simulation for XRT 08/31 at 2 pm at [facility] 8/31- getting simulation for XRT and consult today 9/2- XRT to start 9/12 9/6-  has appt 10/11 for Eliguard and MD f/u.  9. Dysphagia: pt now on regular/thins 10. Neurogenic bladder: continue foley for now  8/31- pt doesn't want to remove to  see if can void; or to do in/out caths- thinks too much stress on family.   9/3- still has some brownish  urine- see through. 9/7 urine generally clear. May have some periodic blood d/t anticoagulation 11. Neurogenic bowel: Will get  KUB to determine stool burden --Colace in am with suppository after supper.  8/25- had good results with bowel program 8/27- good results with bowel program yesterday 8/28- having bowel accidents- and BM with bowel program last 3 nights-  8/29- 2 small BM's last night after bowel program- con't regimen 9/2-9/3 Having results with bowel program nightly  9/5- Didn't have results last night- but had before- will con't to monitor 9/8 pt had large, timely result with fleet enema last night. Continue thru weekend and perhaps convert back to suppository Monday.  12. Sepsis/Likely aspiration PNA: Now on Augmentin--antibiotic D#5/10. BC pend-->neg so far.              --Follow up CXR in am.              --pulmonary hygiene for atelectasis/Asymmetric volume loss on right.  13. Abnormal LFTs: Improved overall except for elevation in Aphos--315. Will recheck in am  8/25- Alk phos up to 457; AST 62 and ALT 96- except for alk phos, they are heading downwards- con't to monitor weekly.  14. ABLA: Recheck in am  -stable 8.7 HGB 8/26, 8/30, 9/3   15. Leucocytosis: Recheck in am. Monitor for fevers/other signs of infection.    8/25- WBC 17.1k- on Augmentin- will recheck labs in AM  -WBC improved to 15.6 10/19/21  8/28- Down to 10.8k- con't to monitor  8/31- WBC 9.5k- doing better            9/3 - 7.9--->labs scheduled for 9/11 16. Hypotension: Will continue midodrine for now.    8/25- BP better 120s/70s- con't regimen 17. DVT left gastrocnemius vein  -8/26 Noted Lovenox was increased 8/25 to 157m BID, Distal DVT,  will recheck in 1-2 weeks, compression stockings,   9/6- needs to go home on Tx lovenox- will need a minimum of 3 months due to DVT- cannot switch to Eliquis, etc since cancer related DVTs need Lovenox!  18. Spasticity  8/28- pt doesn't want spasticity meds- isn't painful or annoying.   9/6- Spasticity somewhat better today 19. Dispo  8/28- will d/c staples and [facility] 8/29- need to see about FMills Koller-supposed to be given 9/12- Oncology to handle it- L/M/via PA about this.  8/30- per pt, they aren't using Firmagen- are using other product- will d/w Oncology more 9/2- XRT starts  9/12- 9/26   - will likely need power w/c with ROHO cushion- will add details as we know them    L[place]: 15 [place]/09/2021, 12:20 PM

## 2021-11-01 NOTE — Progress Notes (Signed)
ANTICOAGULATION CONSULT NOTE-Follow Up Pharmacy Consult for Lovenox Indication: DVT  No Known Allergies  Patient Measurements: Height: '5\' 9"'$  (175.3 cm) Weight: 99.9 kg (220 lb 3.8 oz) IBW/kg (Calculated) : 70.7 Heparin Dosing Weight:   Vital Signs: Temp: 96.6 F (35.9 C) (09/08 1252) Temp Source: Oral (09/08 1252) BP: 133/80 (09/08 1252) Pulse Rate: 72 (09/08 1252)  Labs: Recent Labs    10/30/21 0631 11/01/21 1203  HGB 10.2*  --   HCT 31.5*  --   PLT 290  --   HEPRLOWMOCWT  --  0.95     Estimated Creatinine Clearance: 110.2 mL/min (A) (by C-G formula based on SCr of 0.51 mg/dL (L)).  Assessment: Patient started on VTE treatment dose of Lovenox  since 10/18/21 due to  LE doppler on 10/18/21 showed new DVT LLE.  Continues on full dose Lovenox 100 mg SQ BID (='1mg'$ /kg BID).     AntiXa level checked 9/9 is 0.95 units/ml,  drawn 4 hours after dose.  Level is therapeutic on lovenox 100 mg SQ BID.   Scr <1 stable on 9/4. Hgb  10.2, plts 290 stable on 9/6.  No bleeding reported.  Goal of Therapy:  Anti-Xa level 0.6-1 units/ml 4hrs after LMWH dose given Monitor platelets by anticoagulation protocol: Yes   Plan:  Continue Lovenox '100mg'$  SQ BID Monitor CBC q72 hours SCr weekly.  Thank you Nicole Cella, [place] Pharmacist [phone] 11/01/2021 3:04 PM

## 2021-11-01 NOTE — Progress Notes (Signed)
Occupational Therapy Session Note  Patient Details  Name: Jason [name] MRN: [id number] Date of Birth: April 08, 1955  Today's Date: 11/01/2021 OT Individual Time: 0800-0900 OT Individual Time Calculation (min): 60 min    Short Term Goals: Week 2:  OT Short Term Goal 1 (Week 2): Patient will complete self feeding task with set-up using AE for at least 20 minutes before requiring additional assistance from spouse/rehab staff to complete. OT Short Term Goal 1 - Progress (Week 2): Met OT Short Term Goal 2 (Week 2): Pt will perform sitting EOB with min A for RUE simple functional or reaching task OT Short Term Goal 2 - Progress (Week 2): Progressing toward goal Week 3:  OT Short Term Goal 1 (Week 3): Pt will perform sitting EOB/EOM with min A for RUE simple functional or reaching task OT Short Term Goal 2 (Week 3): Pt will doff/don pull over shirt with min A at bed level with HOB elevated OT Short Term Goal 3 (Week 3): Pt will thread BLE into pants with max A seated in half circle sitting at bed level  Skilled Therapeutic Interventions/Progress Updates:  Pt seen for am self care session with focus on bed level compensatory and adaptive methods training, caregiver training as well as DME for rolling shower chair measuring for new up-fit for w/c accessible home bathroom preparation. Wife meeting with the contractor tomorrow and OT brought wife to shower DME storage to measure CIR rolling pvc style reclining roll in shower chair with commode cut out. OT worked with pt in modified long sit with HOB elevated due to trunk weakness. OT training with introduction with reacher that wife brought in for LB dressing. Pt able to grasp claw style and send pull on shorts to feet with wife and OT assisting to thread catheter and LE's into shorts. Pt initiated rolling to R and L with improved reach to rails with R UE and supported assist to L side. UB bathing and hair washing with hair washing cap with pt able to reach  to top of head face, chest and under arms with increased success and min A. UB dressing pullover shirt with hemi techniques and mod fading to min A. Oral care with set up and assist only to press off button on electric toothbrush with R hand. Left pt bed level as per plan with PT due to full PT then next full OT prior to oof to radiation the whole pm. Bed exit engaged, needs, and adapted call button placed within reach at end of session. No pain reported throughout.    Therapy Documentation Precautions:  Precautions Precautions: Fall, Cervical Precaution Comments: C6 laminectomy, MAP >65 Restrictions Weight Bearing Restrictions: Yes RUE Weight Bearing: Weight bearing as tolerated   Therapy/Group: Individual Therapy  Barnabas Lister 11/01/2021, 7:40 AM

## 2021-11-02 LAB — CBC
HCT: 30.7 % — ABNORMAL LOW (ref 39.0–52.0)
Hemoglobin: 10 g/dL — ABNORMAL LOW (ref 13.0–17.0)
MCH: 31.8 pg (ref 26.0–34.0)
MCHC: 32.6 g/dL (ref 30.0–36.0)
MCV: 97.8 fL (ref 80.0–100.0)
Platelets: 257 10*3/uL (ref 150–400)
RBC: 3.14 MIL/uL — ABNORMAL LOW (ref 4.22–5.81)
RDW: 17.3 % — ABNORMAL HIGH (ref 11.5–15.5)
WBC: 4.6 10*3/uL (ref 4.0–10.5)
nRBC: 0 % (ref 0.0–0.2)

## 2021-11-02 NOTE — Progress Notes (Signed)
PROGRESS NOTE   Subjective/Complaints: Speaking on phone with his friend. His wife asks about swelling of PIPs on right hand- some bruising associated as well and she is not sure if he banged his hand somewhere  ROS: Patient denies fever, rash, sore throat, blurred vision, dizziness, nausea, vomiting, diarrhea, cough, shortness of breath or chest pain,  headache, or mood change. +swelling in right hand PIP joints  Objective:   No results found.  Recent Labs    11/02/21 0609  WBC 4.6  HGB 10.0*  HCT 30.7*  PLT 257   No results for input(s): "NA", "K", "CL", "CO2", "GLUCOSE", "BUN", "CREATININE", "CALCIUM" in the last 72 hours.    Intake/Output Summary (Last 24 hours) at 11/02/2021 1424 Last data filed at 11/02/2021 1300 Gross per 24 hour  Intake 597 ml  Output 750 ml  Net -153 ml        Physical Exam: Vital Signs Blood pressure 124/69, pulse 68, temperature 98.9 F (37.2 C), resp. rate 16, height _0  (1.753 m), weight 99.9 kg, SpO2 99 %.  Gen: no distress, normal appearing HEENT: oral mucosa pink and moist, NCAT Cardio: Reg rate Chest: normal effort, normal rate of breathing Abd: soft, non-distended Ext: no edema Psych: pleasant and cooperative. Talkative and in good spirits Neurological: Ox3 Genitourinary: Golden urine in bag, sides of catheter tubing stained red  Musculoskeletal: L shoulder K tape in place- shoulder still subluxed ~1"    Cervical back: Neck supple.   Moving all 4 extremities today; prior exams:  RUE- biceps 5/5; WE 4-/5; Triceps 2+/5; Grip 3-/5; FA 2-/5 LUE- biceps 5-/5; WE 4/5; Triceps 2+/5' Grip 3+/5; FA 2-/5 RLE- 1to 1+/5 HE, KE and trace at anke LLE- HF 1+/5; KE -/5; DF 1/5; PF 2/5; EHL 1/5  Decreased to light touch from L1 inferiorly; note sacrals not tested RLE spasms decreased; none in LLE  Assessment/Plan: 1. Functional deficits which require 3+ hours per day of  interdisciplinary therapy in a comprehensive inpatient rehab setting. Physiatrist is providing close team supervision and 24 hour management of active medical problems listed below. Physiatrist and rehab team continue to assess barriers to discharge/monitor patient progress toward functional and medical goals  Care Tool:  Bathing    Body parts bathed by patient: Face   Body parts bathed by helper: Right arm, Left arm, Chest, Abdomen, Front perineal area, Buttocks, Left lower leg, Right lower leg, Left upper leg, Right upper leg     Bathing assist Assist Level: Dependent - Patient 0%     Upper Body Dressing/Undressing Upper body dressing   What is the patient wearing?: Pull over shirt    Upper body assist Assist Level: Moderate Assistance - Patient 50 - 74%    Lower Body Dressing/Undressing Lower body dressing      What is the patient wearing?: Pants     Lower body assist Assist for lower body dressing: Total Assistance - Patient < 25%     Toileting Toileting    Toileting assist Assist for toileting: Dependent - Patient 0%     Transfers Chair/bed transfer  Transfers assist  Chair/bed transfer activity did not occur: Safety/medical concerns  Chair/bed transfer  assist level: 2 Helpers     Locomotion Ambulation   Ambulation assist   Ambulation activity did not occur: Safety/medical concerns          Walk 10 feet activity   Assist  Walk 10 feet activity did not occur: Safety/medical concerns        Walk 50 feet activity   Assist Walk 50 feet with 2 turns activity did not occur: Safety/medical concerns         Walk 150 feet activity   Assist Walk 150 feet activity did not occur: Safety/medical concerns         Walk 10 feet on uneven surface  activity   Assist Walk 10 feet on uneven surfaces activity did not occur: Safety/medical concerns         Wheelchair     Assist Is the patient using a wheelchair?: Yes (Pt will use w/c  but was not able to get up to chair today) Type of Wheelchair: Manual Wheelchair activity did not occur: Safety/medical concerns         Wheelchair 50 feet with 2 turns activity    Assist    Wheelchair 50 feet with 2 turns activity did not occur: Safety/medical concerns       Wheelchair 150 feet activity     Assist  Wheelchair 150 feet activity did not occur: Safety/medical concerns       Blood pressure 124/69, pulse 68, temperature 98.9 F (37.2 C), resp. rate 16, height _0  (1.753 m), weight 99.9 kg, SpO2 99 %.  Medical Problem List and Plan: 1. 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  Antithrombotics: -DVT/anticoagulation:  Pharmaceutical: Lovenox.               -antiplatelet therapy: ASA 3. Pain Management: Oxycodone prn. 8/28- pt reports no significant pain- and doesn't want spasticity meds at this time. 9/7-8 - Pain controlled- cont' regimen   4. Mood/Behavior/Sleep: LCSW to follow for evaluation and support.              -antipsychotic agents: N/A 5. Neuropsych/cognition: This patient is capable of making decisions on his own behalf. 6. Skin/Wound Care: Routine pressure relief measures              --monitor incision for healing.  7. Fluids/Electrolytes/Nutrition: Monitor I/O. Check CMET in am.  8. Prostate AdenoCA w/mets: Loading dose Firmagon 240 mg 10/06/21 per Dr. Tasia Catchings --Charlesetta Garibaldi changed to [facility] due to insurance coverage--next dose around 09/13. --Simulation for XRT 08/31 at 2 pm at [facility] 8/31- getting simulation for XRT and consult today 9/2- XRT to start 9/12 9/6- has appt 10/11 for Eliguard and MD f/u.  9. Dysphagia: pt now on regular/thins 10. Neurogenic bladder: continue  foley for now  8/31- pt doesn't want to remove to  see if can void; or to do in/out caths- thinks too much stress on family.   9/3- still has some brownish  urine- see through. 9/7 urine generally clear. May have some periodic blood d/t anticoagulation 11. Neurogenic bowel: Will get KUB to determine stool burden --Colace in am with suppository after supper.  8/25- had good results with bowel program 8/27- good results with bowel program yesterday 8/28- having bowel accidents- and BM with bowel program last 3 nights-  8/29- 2 small BM's last night after bowel program- con't regimen 9/2-9/3 Having results with bowel program nightly  9/5- Didn't have results last night- but had before- will con't to monitor 9/8 pt had large, timely result with fleet enema last night. Continue thru weekend and perhaps convert back to suppository Monday.  12. Sepsis/Likely aspiration PNA: Now on Augmentin--antibiotic D#5/10. BC pend-->neg so far.              --Follow up CXR in am.              --pulmonary hygiene for atelectasis/Asymmetric volume loss on right.  13. Abnormal LFTs: Improved overall except for elevation in Aphos--315. Will recheck in am  8/25- Alk phos up to 457; AST 62 and ALT 96- except for alk phos, they are heading downwards- con't to monitor weekly.  14. ABLA: improving, monitor weekly   15. Leucocytosis: Recheck in am. Monitor for fevers/other signs of infection.    8/25- WBC 17.1k- on Augmentin- will recheck labs in AM  -WBC improved to 15.6 10/19/21  8/28- Down to 10.8k- con't to monitor  8/31- WBC 9.5k- doing better            9/3 - 7.9--->labs scheduled for 9/11 16. Hypotension: continue midodrine.  17. DVT left gastrocnemius vein  -8/26 Noted Lovenox was increased 8/25 to 124m BID, Distal DVT, will recheck in 1-2 weeks, compression stockings,   9/6- needs to go home on Tx lovenox- will need a minimum of 3 months due to DVT- cannot switch to Eliquis, etc since cancer related DVTs  need Lovenox!  18. Spasticity  8/28- pt doesn't want spasticity meds- isn't painful or annoying.   9/6- Spasticity somewhat better today 19. Dispo  8/28- will d/c staples and [facility]- need to see about FMills Koller-supposed to be given 9/12- Oncology to handle it- L/M/via PA about this.  8/30- per pt, they aren't using Firmagen- are using other product- will d/w Oncology more 9/2- XRT starts  9/12- 9/26   - will likely need power w/c with ROHO cushion- will add details as we know them  20. Left hand PIP digit swelling: placed OT order for kinesiotaping and nursing order for ice application    LOS: 16 days A FACE TO [place]9/10/2021, 2:24 PM

## 2021-11-02 NOTE — Progress Notes (Signed)
Physical Therapy Session Note  Patient Details  Name: Jason [name] MRN: [id number] Date of Birth: 09-16-1955  Today's Date: 11/02/2021 PT Individual Time: 1054-1205 PT Individual Time Calculation (min): 71 min   Short Term Goals: Week 2:  PT Short Term Goal 1 (Week 2): Pt will initiate PWC mobility with assist PT Short Term Goal 1 - Progress (Week 2): Met PT Short Term Goal 2 (Week 2): Pt will tolerate sitting OOB >8 hours PT Short Term Goal 2 - Progress (Week 2): Met PT Short Term Goal 3 (Week 2): Pt will perform slideboard transfers with max A x1 consistently PT Short Term Goal 3 - Progress (Week 2): Progressing toward goal   Skilled Therapeutic Interventions/Progress Updates:    Pt received supine in bed with HOB elevated and his wife, [name], present and pt agreeable to therapy session with plan to utilize the tilt table again to promote B LE WBing for NMR to increase muscle activation and for core strengthening/control. Pt already wearing B LE thigh high TED hose - donned tennis shoes total assist.   Supine>R sidelying with max assist for trunk/pelvic rotation and L LE positioning into hooklying (pt able to slightly move towards hip/knee flexion against gravity) and therapist facilitating L UE reaching across his body to use bedrail for trunk rotation - pt unable to actively reach L UE fuly across his body and grab the bedrail due to L shoulder subluxation and decreased strength.   Supine>L sidelying with max assist for trunk/pelvic rotation and for positioning R LE into hooklying position and pt able to better initiate bringing R UE across his body to use the bedrail to perform trunk rotation.    Therapist placed a sheet underneath pt to perform dependent supine transfer from bed to tilt table with 4 person assist for safety.     Progressed to 25 degrees elevation. Assessed vitals: BP 144/77 (MAP 96), HR 75bpm    Progressed to 50 degrees. Vitals: BP 139/78 (MAP 93), HR 75bpm      Progressed to 60 degrees. Vitals: BP 134/75 (MAP 93), HR 70bpm   Pt with no symptoms indicating change in vitals throughout session.   Performed 3x10reps of mini-squats with knee strap loosened to allow ~20-35degrees of knee flexion - pt able to actively extend L knee fully back into the mat table and is now able to actively extend R LE back into the mat table as well!  Therapist facilitates returning to knee flexed/"squat" position between reps of knee extension.  Pt demonstrates significant improvement in ability to maintain trunk upright and in midline with no R lateral trunk lean noted.   Performed x10 reps modified cross body B UE "punches" to work towards trunk flexion and rotation for core strengthening.    Performed reclined "sit-ups" x10 reps with goal of bringing his shoulders forward off of the tilt table.   Attempted to perform R/L lateral trunk flexion x10 reps.   Performed B UE bicep curls x30 reps.   Vitals after ~27mnutes at 60 degrees: BP 138/78 (MAP 95), HR 74bpm     He demonstrates improved ability to tolerate a greater upright tilt for a longer period of time today with improved core stability/strength.   Pt tolerated the tilt table for a total of ~462mutes.    Dependent supine transfer back to EOB as described above.    Therapist assisted with repositioning pt supine in bed with B UEs and B LEs elevated - reinforced education with wife on keeping  his hands elevated as pt noted to have significant swelling in his fingers bilaterally. Pt left with needs in reach and his wife present.  Therapy Documentation Precautions:  Precautions Precautions: Fall, Cervical Precaution Comments: C6 laminectomy, MAP >65 Restrictions Weight Bearing Restrictions: Yes RUE Weight Bearing: Weight bearing as tolerated   Pain:  No reports of pain throughout session.    Therapy/Group: Individual Therapy  Tawana Scale , PT, DPT, NCS, CSRS 11/02/2021, 8:01 AM

## 2021-11-02 NOTE — Progress Notes (Signed)
Occupational Therapy Session Note  Patient Details  Name: Jason [name] MRN: [id number] Date of Birth: [birth year]  Today's Date: 11/02/2021 OT Individual Time: 0920-1016 OT Individual Time Calculation (min): 56 min    Short Term Goals: Week 3:  OT Short Term Goal 1 (Week 3): Pt will perform sitting EOB/EOM with min A for RUE simple functional or reaching task OT Short Term Goal 2 (Week 3): Pt will doff/don pull over shirt with min A at bed level with HOB elevated OT Short Term Goal 3 (Week 3): Pt will thread BLE into pants with max A seated in half circle sitting at bed level  Skilled Therapeutic Interventions/Progress Updates:  Pt awake in bed listening to music upon OT arrival to the room. Pt reports, "I haven't seen you in a long time." Pt in agreement for OT session.  Therapy Documentation Precautions:  Precautions Precautions: Fall, Cervical Precaution Comments: C6 laminectomy, MAP >65 Restrictions Weight Bearing Restrictions: Yes RUE Weight Bearing: Weight bearing as tolerated Vital Signs: Please see "Flowsheet" for most recent vitals charted by nursing staff.  Pain: Pain Assessment Pain Scale: 0-10 Pain Score: 0-No pain  ADL: Upper Body Bathing: Minimal assistance (Pt able to bathe all UB parts with minimal assistance to manage and position LUE to ensure access or reaching RUE to bathe all areas at bed level.) Where Assessed-Upper Body Bathing: Bed level Lower Body Bathing: Maximal assistance (Pt able to participate in bathing 2/5 LB parts as pt is able to participate in bathing B thighs at bed level. Pt requires assistance ot bathe all other areas.) Where Assessed-Lower Body Bathing: Bed level Upper Body Dressing: Maximal assistance (Pt able to participatin in unthreading and threading BUE in/out of sleeves of pull-over style shirt. Pt requires maximal assistance to fully doff and don a pull-over style shirt at bed level with pt using BUE to pull on bed rails to perform  a sit-up.) Where Assessed-Upper Body Dressing: Edge of bed Lower Body Dressing: Dependent (Pt requires total assistance to don pants at bed level.) Where Assessed-Lower Body Dressing: Bed level Toileting: Dependent (Pt has foley catheter and had a medium amount of BM in brief. Pt requires total assistance to change brief and perform toileting hygiene and bed level.) Where Assessed-Toileting: Bed level ADL Comments: Pt able to roll to B sides with maximal assistance to L and moderate assistance to R as pt is able to reach and pull with LUE to assist with rolling during ADL tasks. Pt demo's improved BUE strength and coordination as indicated by being able to participate in threading BUE through clothing. Pt and pt's spouse demo good accuracy and communication with directing care and spouse encourages pt to practice skills needed to direct care. Spouse very involved and supportive to completing tasks during OT session.  Pt remained bed at end of session. Pt left resting comfortably in bed with personal belongings and call light within reach, bed alarm on and activated, be din low position, 3 bed rails up, spouse present in room to assist with oral care, comfort needs attended to.   Therapy/Group: Individual Therapy  Barbee Shropshire 11/02/2021, 12:54 PM

## 2021-11-03 NOTE — Progress Notes (Signed)
PROGRESS NOTE   Subjective/Complaints: Swelling in right hand is improved.   ROS: Patient denies fever, rash, sore throat, blurred vision, dizziness, nausea, vomiting, diarrhea, cough, shortness of breath or chest pain,  headache, or mood change. +swelling in right hand PIP joints  Objective:   No results found.  Recent Labs    11/02/21 0609  WBC 4.6  HGB 10.0*  HCT 30.7*  PLT 257   No results for input(s): "NA", "K", "CL", "CO2", "GLUCOSE", "BUN", "CREATININE", "CALCIUM" in the last 72 hours.    Intake/Output Summary (Last 24 hours) at 11/03/2021 1907 Last data filed at 11/03/2021 1314 Gross per 24 hour  Intake 472 ml  Output 1250 ml  Net -778 ml        Physical Exam: Vital Signs Blood pressure 121/69, pulse 73, temperature 98.4 F (36.9 C), temperature source Oral, resp. rate 15, height '5\' 9"'  (1.753 m), weight 99.9 kg, SpO2 97 %. Gen: no distress, normal appearing HEENT: oral mucosa pink and moist, NCAT Cardio: Reg rate Chest: normal effort, normal rate of breathing Abd: soft, non-distended Ext: no edema Psych: pleasant and cooperative. Talkative and in good spirits Neurological: Ox3 Genitourinary: Golden urine in bag, sides of catheter tubing stained red  Musculoskeletal: L shoulder K tape in place- shoulder still subluxed ~1"    Cervical back: Neck supple.   Moving all 4 extremities today; prior exams:  RUE- biceps 5/5; WE 4-/5; Triceps 2+/5; Grip 3-/5; FA 2-/5 LUE- biceps 5-/5; WE 4/5; Triceps 2+/5' Grip 3+/5; FA 2-/5 RLE- 1to 1+/5 HE, KE and trace at anke LLE- HF 1+/5; KE -/5; DF 1/5; PF 2/5; EHL 1/5  Decreased to light touch from L1 inferiorly; note sacrals not tested RLE spasms decreased; none in LLE  Assessment/Plan: 1. Functional deficits which require 3+ hours per day of interdisciplinary therapy in a comprehensive inpatient rehab setting. Physiatrist is providing close team supervision and  24 hour management of active medical problems listed below. Physiatrist and rehab team continue to assess barriers to discharge/monitor patient progress toward functional and medical goals  Care Tool:  Bathing    Body parts bathed by patient: Face   Body parts bathed by helper: Right arm, Left arm, Chest, Abdomen, Front perineal area, Buttocks, Left lower leg, Right lower leg, Left upper leg, Right upper leg     Bathing assist Assist Level: Dependent - Patient 0%     Upper Body Dressing/Undressing Upper body dressing   What is the patient wearing?: Pull over shirt    Upper body assist Assist Level: Moderate Assistance - Patient 50 - 74%    Lower Body Dressing/Undressing Lower body dressing      What is the patient wearing?: Pants     Lower body assist Assist for lower body dressing: Total Assistance - Patient < 25%     Toileting Toileting    Toileting assist Assist for toileting: Dependent - Patient 0%     Transfers Chair/bed transfer  Transfers assist  Chair/bed transfer activity did not occur: Safety/medical concerns  Chair/bed transfer assist level: 2 Helpers     Locomotion Ambulation   Ambulation assist   Ambulation activity did not occur: Safety/medical concerns  Walk 10 feet activity   Assist  Walk 10 feet activity did not occur: Safety/medical concerns        Walk 50 feet activity   Assist Walk 50 feet with 2 turns activity did not occur: Safety/medical concerns         Walk 150 feet activity   Assist Walk 150 feet activity did not occur: Safety/medical concerns         Walk 10 feet on uneven surface  activity   Assist Walk 10 feet on uneven surfaces activity did not occur: Safety/medical concerns         Wheelchair     Assist Is the patient using a wheelchair?: Yes (Pt will use w/c but was not able to get up to chair today) Type of Wheelchair: Manual Wheelchair activity did not occur: Safety/medical  concerns         Wheelchair 50 feet with 2 turns activity    Assist    Wheelchair 50 feet with 2 turns activity did not occur: Safety/medical concerns       Wheelchair 150 feet activity     Assist  Wheelchair 150 feet activity did not occur: Safety/medical concerns       Blood pressure 121/69, pulse 73, temperature 98.4 F (36.9 C), temperature source Oral, resp. rate 15, height '5\' 9"'  (1.753 m), weight 99.9 kg, SpO2 97 %.  Medical Problem List and Plan: 1. 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  Antithrombotics: -DVT/anticoagulation:  Pharmaceutical: Lovenox.               -antiplatelet therapy: ASA 3. Pain: continue Oxycodone prn. 8/28- pt reports no significant pain- and doesn't want spasticity meds at this time. 9/7-8 - Pain controlled- cont' regimen   4. Mood/Behavior/Sleep: LCSW to follow for evaluation and support.              -antipsychotic agents: N/A 5. Neuropsych/cognition: This patient is capable of making decisions on his own behalf. 6. Skin/Wound Care: Routine pressure relief measures              --monitor incision for healing.  7. Fluids/Electrolytes/Nutrition: Monitor I/O. Check CMET in am.  8. Prostate AdenoCA w/mets: Loading dose Firmagon 240 mg 10/06/21 per Dr. Tasia Catchings --Charlesetta Garibaldi changed to [facility] due to insurance coverage--next dose around 09/13. --Simulation for XRT 08/31 at 2 pm at [facility] 8/31- getting simulation for XRT and consult today 9/2- XRT to start 9/12 9/6- has appt 10/11 for Eliguard and MD f/u.  9. Dysphagia: pt now on regular/thins 10. Neurogenic bladder: continue foley for now  8/31- pt doesn't want to remove to  see if can void; or to do in/out caths- thinks  too much stress on family.   9/3- still has some brownish  urine- see through. 9/7 urine generally clear. May have some periodic blood d/t anticoagulation 11. Neurogenic bowel: Will get KUB to determine stool burden --continue Colace in am with suppository after supper.  8/25- had good results with bowel program 8/27- good results with bowel program yesterday 8/28- having bowel accidents- and BM with bowel program last 3 nights-  8/29-  2 small BM's last night after bowel program- con't regimen 9/2-9/3 Having results with bowel program nightly  9/5- Didn't have results last night- but had before- will con't to monitor 9/8 pt had large, timely result with fleet enema last night. Continue thru weekend and perhaps convert back to suppository Monday.  12. Sepsis/Likely aspiration PNA: Now on Augmentin--antibiotic D#5/10. BC pend-->neg so far.              --Follow up CXR in am.              --pulmonary hygiene for atelectasis/Asymmetric volume loss on right.  13. Abnormal LFTs: Improved overall except for elevation in Aphos--315. Will recheck in am  8/25- Alk phos up to 457; AST 62 and ALT 96- except for alk phos, they are heading downwards- con't to monitor weekly.  14. ABLA: improving, monitor weekly   15. Leucocytosis: Recheck in am. Monitor for fevers/other signs of infection.    8/25- WBC 17.1k- on Augmentin- will recheck labs in AM  -WBC improved to 15.6 10/19/21  8/28- Down to 10.8k- con't to monitor  8/31- WBC 9.5k- doing better            9/3 - 7.9--->labs scheduled for 9/11 16. Hypotension: continue midodrine.  17. DVT left gastrocnemius vein  -8/26 Noted Lovenox was increased 8/25 to 112m BID, Distal DVT, will recheck in 1-2 weeks, compression stockings,   9/6- needs to go home on Tx lovenox- will need a minimum of 3 months due to DVT- cannot switch to Eliquis, etc since cancer related DVTs need Lovenox!  18. Spasticity  8/28- pt doesn't want spasticity meds- isn't painful or  annoying.   9/6- Spasticity somewhat better today 19. Dispo  8/28- will d[facility]c staples [facility]- need to see about FMills Koller-supposed to be given 9/12- Oncology to handle it- L/M/via PA about this.  8/30- per pt, they aren't using Firmagen- are using other product- will d/w Oncology more 9/2- XRT starts  9/12- 9/26   - will likely need power w/c with ROHO cushion- will add details as we know them  20. Left hand PIP digit swelling: placed OT order for kinesiotaping and nursing order for ice application    LOS: 17 days A FACE TO FRaleigh Hills9/11/2021, 7:07 PM

## 2021-11-04 ENCOUNTER — Inpatient Hospital Stay (HOSPITAL_COMMUNITY): Payer: BC Managed Care – PPO

## 2021-11-04 ENCOUNTER — Telehealth (HOSPITAL_COMMUNITY): Payer: Self-pay | Admitting: Pharmacy Technician

## 2021-11-04 ENCOUNTER — Encounter: Payer: BC Managed Care – PPO | Admitting: Internal Medicine

## 2021-11-04 ENCOUNTER — Other Ambulatory Visit (HOSPITAL_COMMUNITY): Payer: Self-pay

## 2021-11-04 ENCOUNTER — Other Ambulatory Visit: Payer: Self-pay

## 2021-11-04 ENCOUNTER — Ambulatory Visit
Admit: 2021-11-04 | Discharge: 2021-11-04 | Disposition: A | Discharge status: Home / Self Care | Payer: BC Managed Care – PPO | Attending: Radiation Oncology | Admitting: Radiation Oncology

## 2021-11-04 DIAGNOSIS — C7931 Secondary malignant neoplasm of brain: Secondary | ICD-10-CM | POA: Diagnosis not present

## 2021-11-04 DIAGNOSIS — I82409 Acute embolism and thrombosis of unspecified deep veins of unspecified lower extremity: Secondary | ICD-10-CM | POA: Diagnosis not present

## 2021-11-04 DIAGNOSIS — Z510 Encounter for antineoplastic radiation therapy: Secondary | ICD-10-CM | POA: Diagnosis not present

## 2021-11-04 DIAGNOSIS — I959 Hypotension, unspecified: Secondary | ICD-10-CM

## 2021-11-04 DIAGNOSIS — C490 Malignant neoplasm of connective and soft tissue of head, face and neck: Secondary | ICD-10-CM | POA: Diagnosis not present

## 2021-11-04 DIAGNOSIS — C7951 Secondary malignant neoplasm of bone: Secondary | ICD-10-CM | POA: Diagnosis not present

## 2021-11-04 DIAGNOSIS — D62 Acute posthemorrhagic anemia: Secondary | ICD-10-CM

## 2021-11-04 DIAGNOSIS — E785 Hyperlipidemia, unspecified: Secondary | ICD-10-CM | POA: Diagnosis not present

## 2021-11-04 DIAGNOSIS — I10 Essential (primary) hypertension: Secondary | ICD-10-CM | POA: Diagnosis not present

## 2021-11-04 DIAGNOSIS — Z79899 Other long term (current) drug therapy: Secondary | ICD-10-CM | POA: Diagnosis not present

## 2021-11-04 DIAGNOSIS — M7989 Other specified soft tissue disorders: Secondary | ICD-10-CM

## 2021-11-04 DIAGNOSIS — C61 Malignant neoplasm of prostate: Secondary | ICD-10-CM | POA: Diagnosis not present

## 2021-11-04 LAB — RAD ONC ARIA SESSION SUMMARY
Course Elapsed Days: 4
Plan Fractions Treated to Date: 3
Plan Prescribed Dose Per Fraction: 3 Gy
Plan Total Fractions Prescribed: 10
Plan Total Prescribed Dose: 30 Gy
Reference Point Dosage Given to Date: 9 Gy
Reference Point Session Dosage Given: 3 Gy
Session Number: 3

## 2021-11-04 LAB — BASIC METABOLIC PANEL
Anion gap: 7 (ref 5–15)
BUN: 16 mg/dL (ref 8–23)
CO2: 24 mmol/L (ref 22–32)
Calcium: 8.6 mg/dL — ABNORMAL LOW (ref 8.9–10.3)
Chloride: 106 mmol/L (ref 98–111)
Creatinine, Ser: 0.47 mg/dL — ABNORMAL LOW (ref 0.61–1.24)
GFR, Estimated: >60 mL/min (ref >60)
Glucose, Bld: 96 mg/dL (ref 70–99)
Potassium: 3.6 mmol/L (ref 3.5–5.1)
Sodium: 137 mmol/L (ref 135–145)

## 2021-11-04 MED ORDER — APIXABAN 5 MG PO TABS
5.0000 mg | ORAL_TABLET | Freq: Two times a day (BID) | ORAL | Status: DC
  Administered 2021-11-04 – 2021-11-19 (×30): 5 mg via ORAL
  Filled 2021-11-04 (×30): qty 1

## 2021-11-04 NOTE — Progress Notes (Signed)
PROGRESS NOTE   Subjective/Complaints: R hand swelling continues to improve.  Reports he had some L shoulder pain last night that resolved with tylenol. Reports he has not had recent breathing treatments.   LBM 9/10  ROS: Patient denies fever, rash, sore throat, blurred vision, dizziness, nausea, vomiting, diarrhea, cough, shortness of breath or chest pain,  headache, or mood change. +swelling in right hand PIP joints, R shoulder pain-improved  Objective:   No results found.  Recent Labs    11/02/21 0609  WBC 4.6  HGB 10.0*  HCT 30.7*  PLT 257    Recent Labs    11/04/21 0739  NA 137  K 3.6  CL 106  CO2 24  GLUCOSE 96  BUN 16  CREATININE 0.47*  CALCIUM 8.6*      Intake/Output Summary (Last 24 hours) at 11/04/2021 1107 Last data filed at 11/04/2021 4920 Gross per 24 hour  Intake 590 ml  Output 2050 ml  Net -1460 ml         Physical Exam: Vital Signs Blood pressure 130/72, pulse 75, temperature 98.9 F (37.2 C), temperature source Oral, resp. rate 17, height '5\' 9"'  (1.753 m), weight 99.9 kg, SpO2 95 %. Gen: no distress, normal appearing HEENT: oral mucosa pink and moist, NCAT, EOMI Cardio: RRR, No M/R/G Chest: CTAB, or R/R/W, normal effort, normal rate of breathing Abd: soft, non-distended, +BS, non-tender Ext: no edema Psych: pleasant and cooperative. Talkative and in good spirits Neurological: Ox3 Genitourinary: Golden urine in bag, sides of catheter tubing stained red  Musculoskeletal: L shoulder K tape in place- shoulder still subluxed ~1"    Cervical back: Neck supple.   Moving all 4 extremities today; prior exams:  RUE- biceps 5/5; WE 4-/5; Triceps 2+/5; Grip 3-/5; FA 2-/5 LUE- biceps 5-/5; WE 4/5; Triceps 2+/5' Grip 3+/5; FA 2-/5 RLE- 1to 1+/5 HE, KE and trace at anke LLE- HF 1+/5; KE -/5; DF 1/5; PF 2/5; EHL 1/5  Decreased to light touch from L1 inferiorly; note sacrals not tested RLE  spasms decreased; none in LLE  Assessment/Plan: 1. Functional deficits which require 3+ hours per day of interdisciplinary therapy in a comprehensive inpatient rehab setting. Physiatrist is providing close team supervision and 24 hour management of active medical problems listed below. Physiatrist and rehab team continue to assess barriers to discharge/monitor patient progress toward functional and medical goals  Care Tool:  Bathing    Body parts bathed by patient: Face   Body parts bathed by helper: Right arm, Left arm, Chest, Abdomen, Front perineal area, Buttocks, Left lower leg, Right lower leg, Left upper leg, Right upper leg     Bathing assist Assist Level: Dependent - Patient 0%     Upper Body Dressing/Undressing Upper body dressing   What is the patient wearing?: Pull over shirt    Upper body assist Assist Level: Minimal Assistance - Patient > 75%    Lower Body Dressing/Undressing Lower body dressing      What is the patient wearing?: Pants     Lower body assist Assist for lower body dressing: Total Assistance - Patient < 25%     Toileting Toileting    Toileting assist  Assist for toileting: Dependent - Patient 0%     Transfers Chair/bed transfer  Transfers assist  Chair/bed transfer activity did not occur: Safety/medical concerns  Chair/bed transfer assist level: 2 Helpers     Locomotion Ambulation   Ambulation assist   Ambulation activity did not occur: Safety/medical concerns          Walk 10 feet activity   Assist  Walk 10 feet activity did not occur: Safety/medical concerns        Walk 50 feet activity   Assist Walk 50 feet with 2 turns activity did not occur: Safety/medical concerns         Walk 150 feet activity   Assist Walk 150 feet activity did not occur: Safety/medical concerns         Walk 10 feet on uneven surface  activity   Assist Walk 10 feet on uneven surfaces activity did not occur: Safety/medical  concerns         Wheelchair     Assist Is the patient using a wheelchair?: Yes (Pt will use w/c but was not able to get up to chair today) Type of Wheelchair: Manual Wheelchair activity did not occur: Safety/medical concerns         Wheelchair 50 feet with 2 turns activity    Assist    Wheelchair 50 feet with 2 turns activity did not occur: Safety/medical concerns       Wheelchair 150 feet activity     Assist  Wheelchair 150 feet activity did not occur: Safety/medical concerns       Blood pressure 130/72, pulse 75, temperature 98.9 F (37.2 C), temperature source Oral, resp. rate 17, height '5\' 9"'  (1.753 m), weight 99.9 kg, SpO2 95 %.  Medical Problem List and Plan: 1. 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  Antithrombotics: -DVT/anticoagulation:  Pharmaceutical: Lovenox.               -antiplatelet therapy: ASA 3. Pain: continue Oxycodone prn. 8/28- pt reports no significant pain- and doesn't want spasticity meds at this time. 9/7-8 - Pain controlled- cont' regimen   4. Mood/Behavior/Sleep: LCSW to follow for evaluation and support.              -antipsychotic agents: N/A 5. Neuropsych/cognition: This patient is capable of making decisions on his own behalf. 6. Skin/Wound Care: Routine pressure relief measures              --monitor incision for healing.  7. Fluids/Electrolytes/Nutrition: Monitor I/O. Check CMET in am.  8. Prostate AdenoCA w/mets: Loading dose Firmagon 240 mg 10/06/21 per Dr. Tasia Catchings --Charlesetta Garibaldi changed to [facility] due to insurance coverage--next dose around 09/13. --Simulation for XRT 08/31 at 2 pm at [facility] 8/31- getting simulation for XRT and consult today 9/2- XRT to  start 9/12 9/6- has appt 10/11 for Eliguard and MD f/u.  9. Dysphagia: pt now on regular/thins 10. Neurogenic bladder: continue foley for now  8/31- pt doesn't want to remove to  see if can void; or to do in/out caths- thinks too much stress on family.   9/3- still has some brownish  urine- see through. 9/7 urine generally clear. May have some periodic blood d/t anticoagulation 11. Neurogenic bowel: Will get KUB to determine stool burden --continue Colace in am with suppository after supper.  8/25- had good results with bowel program 8/27- good results with bowel program yesterday 8/28- having bowel accidents- and BM with bowel program last 3 nights-  8/29- 2 small BM's last night after bowel program- con't regimen 9/2-9/3 Having results with bowel program nightly  9/5- Didn't have results last night- but had before- will con't to monitor 9/8 pt had large, timely result with fleet enema last night. Continue thru weekend and perhaps convert back to suppository Monday.  9/11 Had large BM with enema, will continue tonight, consider restart suppository tomorrow 12. Sepsis/Likely aspiration PNA: Now on Augmentin--antibiotic D#5/10. BC pend-->neg so far.              --Follow up CXR in am.              --pulmonary hygiene for atelectasis/Asymmetric volume loss on right.  13. Abnormal LFTs: Improved overall except for elevation in Aphos--315. Will recheck in am  8/25- Alk phos up to 457; AST 62 and ALT 96- except for alk phos, they are heading downwards- con't to monitor weekly.  14. ABLA: improving, monitor weekly   15. Leucocytosis: Recheck in am. Monitor for fevers/other signs of infection.    8/25- WBC 17.1k- on Augmentin- will recheck labs in AM  -WBC improved to 15.6 10/19/21  8/28- Down to 10.8k- con't to monitor  8/31- WBC 9.5k- doing better            9/3 - 7.9--->4.6 on 9/9, improved 16. Hypotension: continue midodrine.   9/11 BP has been stable, denies symptoms, continue midodrine   17. DVT left gastrocnemius vein  -8/26 Noted Lovenox was increased 8/25 to 111m BID, Distal DVT, will recheck in 1-2 weeks, compression stockings,   9/6- needs to go home on Tx lovenox- will need a minimum of 3 months due to DVT- cannot switch to Eliquis, etc since cancer related DVTs need Lovenox!  18. Spasticity  8/28- pt doesn't want spasticity meds- isn't painful or annoying.   9/6- Spasticity somewhat better today 19. Dispo  8/28- will d/c staples and see if [facility]- need to see about FMills Koller-supposed to be given 9/12- Oncology to handle it- L/M/via PA about this.  8/30- per pt, they aren't using Firmagen- are using other product- will d/w Oncology more 9/2- XRT starts  9/12- 9/26   - will likely need power w/c with ROHO cushion- will add details as we know them  20. Left hand PIP digit swelling: placed OT order for kinesiotaping and nursing order for ice application-improved    LOS: 18 days A FACE TO FACE EVALUATION WAS PERFORMED  YJennye Boroughs9/12/2021, 11:07 AM

## 2021-11-04 NOTE — Progress Notes (Signed)
Pt mentioned with concern not having respiratory do any treatments for the past two days, but is breathing okay and is still using cough and deep breath and his incentive spirometer. Pt still has a weak cough with small amounts of sputum on occasion.

## 2021-11-04 NOTE — TOC Benefit Eligibility Note (Signed)
Patient Jason [name], English as a foreign language completed.    The patient is currently admitted and upon discharge could be taking Eliquis 5 mg.  The current 30 day co-pay is $0.00.   The patient is insured through [place], [place] Patient Advocate Specialist [place] Patient Advocate Team Direct Number: [phone]  Fax: [phone]

## 2021-11-04 NOTE — Progress Notes (Signed)
Occupational Therapy Session Note  Patient Details  Name: Jason [name] MRN: [id number] Date of Birth: Jan 26, 1956  Today's Date: 11/04/2021 OT Individual Time: 7622-6333 OT Individual Time Calculation (min): 74 min    Short Term Goals: Week 3:  OT Short Term Goal 1 (Week 3): Pt will perform sitting EOB/EOM with min A for RUE simple functional or reaching task OT Short Term Goal 2 (Week 3): Pt will doff/don pull over shirt with min A at bed level with HOB elevated OT Short Term Goal 3 (Week 3): Pt will thread BLE into pants with max A seated in half circle sitting at bed level  Skilled Therapeutic Interventions/Progress Updates:    Pt resting in bed upon arrival. OT intervention with focus on UB dressing, bed mobility, sitting balance EOB, SB transfers, and activity tolerance to increase independence with BADLs. HOB elevated >60* throughout UB dressing tasks to provide support. Pt doffed and donned pullover shirt with min A this morning. Pt used bed rails to correct sitting balance when starting to lean. Rolling R/L in bed with max A+1 using bed rails. Pt able to assist with pulling pants up to hips after threading pants over BLE. Dependent for pullover hips when rolling. Supine>sit EOB with tot A. Sitting balance EOB with max A in preparation for SB tranfser to w/c. SB tranfser to w/c with max A+2. Pt maneuvers PWC in room with supervision. Pt remained in [place] with soft call bell within reach.   Therapy Documentation Precautions:  Precautions Precautions: Fall, Cervical Precaution Comments: C6 laminectomy, MAP >65 Restrictions Weight Bearing Restrictions: Yes RUE Weight Bearing: Weight bearing as tolerated   Pain:  Pt denies pain this morning   Therapy/Group: Individual Therapy  Leroy Libman 11/04/2021, 8:18 AM

## 2021-11-04 NOTE — Telephone Encounter (Signed)
Pharmacy Patient Advocate Encounter  Insurance verification completed.    The patient is insured through [employer]   The patient is currently admitted and ran test claims for the following: Eliquis.  Copays and coinsurance results were relayed to Inpatient clinical team.      

## 2021-11-04 NOTE — Progress Notes (Signed)
Physical Therapy Session Note  Patient Details  Name: Jason [name] MRN: [id number] Date of Birth: 20-Jun-1955  Today's Date: 11/04/2021 PT Individual Time: 1102-1200 PT Individual Time Calculation (min): 58 min   Short Term Goals: Week 2:  PT Short Term Goal 1 (Week 2): Pt will initiate PWC mobility with assist PT Short Term Goal 1 - Progress (Week 2): Met PT Short Term Goal 2 (Week 2): Pt will tolerate sitting OOB >8 hours PT Short Term Goal 2 - Progress (Week 2): Met PT Short Term Goal 3 (Week 2): Pt will perform slideboard transfers with max A x1 consistently PT Short Term Goal 3 - Progress (Week 2): Progressing toward goal  Skilled Therapeutic Interventions/Progress Updates: Pt presents sitting in PWC, tilted back, agreeable to therapy.  PT asking pt to perform multiple tasks w/ w/c including turning on power, switch between movable parts (seat, back and both) and then performing each.  PT noted slightly rotated hips, so removed seat belt for mod A and use of UE s to shift hips.  Pt required cueing to latch seatbelt.  Pt lowered w/c and negotiated out of room w/ supervision and then performs speed change.  Pt negotiated all the way down 4th tower and negotiates around obstacles in hallway.  Pt performed multiple trials of pressure relief tilting w/c back.  Pt then performed reaching forward (R > L) in slightly tilted w/c x 3 sets.  Pt wheeled back to room w/ only cues for changing speeds for safety in and out of confined spaces.  Pt parks w/c alongside bed and reclines w/c.  Spouse in room, all needs in reach.     Therapy Documentation Precautions:  Precautions Precautions: Fall, Cervical Precaution Comments: C6 laminectomy, MAP >65 Restrictions Weight Bearing Restrictions: Yes RUE Weight Bearing: Weight bearing as tolerated General:   Vital Signs: Therapy Vitals Temp: 98.3 F (36.8 C) Temp Source: Oral Pulse Rate: 71 Resp: 17 BP: 138/82 Patient Position (if appropriate):  Sitting Oxygen Therapy SpO2: 98 % O2 Device: Room Air Pain:0/10       Therapy/Group: Individual Therapy  Ladoris Gene 11/04/2021, 2:20 PM

## 2021-11-04 NOTE — Progress Notes (Addendum)
Lower extremity venous left study completed.   Please see CV Proc for preliminary results.   Taneesha Edgin, RDMS, RVT  

## 2021-11-04 NOTE — Progress Notes (Signed)
Occupational Therapy Session Note  Patient Details  Name: Jason [name]  MRN: [id number] Date of Birth: May 23, 1955  Today's Date: 11/04/2021 OT Individual Time: 1000-1015 OT Individual Time Calculation (min): 15 min    Short Term Goals: Week 3:  OT Short Term Goal 1 (Week 3): Pt will perform sitting EOB/EOM with min A for RUE simple functional or reaching task OT Short Term Goal 2 (Week 3): Pt will doff/don pull over shirt with min A at bed level with HOB elevated OT Short Term Goal 3 (Week 3): Pt will thread BLE into pants with max A seated in half circle sitting at bed level   Skilled Therapeutic Interventions/Progress Updates:  OT arrived prior to PT and ATP for w/c evaluation for custom moroized seating. Wife had questions re: use of bucket/pan use under rolling shower chair and OT explained multi uses with and without for toileting, bowel program and showering.  wheelchair assessment based on functional needs, ADL's, mobility and home needs and accessibility. Pt was up in [place] power w/c upon PT and Corene Cornea- rep from [employer] arrival to discuss funding, pt's goals and needs for seating and assist pt and team with appropriate custom evaluation. Pt has the following limitations with posture, upper body ROM, strength and coordination including cervical, thoracic, lumbosacral weakness, postural deficits including forward head, protracted scapulae, L shoulder subluxation, kyphosis and sacral sitting as well as limited control of lower body with abduction tendencies at hips and knees, paralysis, and tone fluctuations. Pt also has specialized SCI needs related to orthostatic hypotension, bowel and bladder deficits, significantly limited pressure relief capabilities due to sensory motor deficits related to spinal issues. OT provided input related to deficits and functional implications of new seating system needs. OT left vendor and PT at end of session to complete assessment.   Therapy  Documentation Precautions:  Precautions Precautions: Fall, Cervical Precaution Comments: C6 laminectomy, MAP >65 Restrictions Weight Bearing Restrictions: Yes RUE Weight Bearing: Weight bearing as tolerated    Therapy/Group: Individual Therapy  Barnabas Lister 11/04/2021, 7:54 AM

## 2021-11-04 NOTE — Progress Notes (Signed)
Physical Therapy Session Note  Patient Details  Name: Jason [name] MRN: [id number] Date of Birth: 07/09/1955  Today's Date: 11/04/2021 PT Individual Time: 1015-1100 PT Individual Time Calculation (min): 45 min   Short Term Goals: Week 2:  PT Short Term Goal 1 (Week 2): Pt will initiate PWC mobility with assist PT Short Term Goal 1 - Progress (Week 2): Met PT Short Term Goal 2 (Week 2): Pt will tolerate sitting OOB >8 hours PT Short Term Goal 2 - Progress (Week 2): Met PT Short Term Goal 3 (Week 2): Pt will perform slideboard transfers with max A x1 consistently PT Short Term Goal 3 - Progress (Week 2): Progressing toward goal  Skilled Therapeutic Interventions/Progress Updates:    Pt seen for w/c eval with Danae Chen, OT, and Jason, ATP, present. Pt seated in [place] throughout. Discussed recommendations for PWC, including seat elevation, TIS, power recline, LE elevation. Pt agreeable to chair model he has been using with addition of neccessary features, such as abduction pads. Pt's wife with several questions about home set up, ramp, hoyer training, transporting [place]. Discussed options and contingency plan if pt and wife are not able to safely perform car transfer. Discussed these topics at length, with therapist able to direct questions for other staff members where required. Pt remained in w/c at end of session with his wife present.   Therapy Documentation Precautions:  Precautions Precautions: Fall, Cervical Precaution Comments: C6 laminectomy, MAP >65 Restrictions Weight Bearing Restrictions: Yes RUE Weight Bearing: Weight bearing as tolerated General:       Therapy/Group: Individual Therapy  [name] 11/04/2021, 12:47 PM

## 2021-11-05 ENCOUNTER — Other Ambulatory Visit: Payer: Self-pay

## 2021-11-05 ENCOUNTER — Ambulatory Visit: Payer: BC Managed Care – PPO | Admitting: Oncology

## 2021-11-05 ENCOUNTER — Ambulatory Visit: Payer: BC Managed Care – PPO

## 2021-11-05 ENCOUNTER — Ambulatory Visit
Admit: 2021-11-05 | Discharge: 2021-11-05 | Disposition: A | Discharge status: Home / Self Care | Payer: BC Managed Care – PPO | Attending: Radiation Oncology | Admitting: Radiation Oncology

## 2021-11-05 ENCOUNTER — Other Ambulatory Visit: Payer: BC Managed Care – PPO

## 2021-11-05 DIAGNOSIS — C7931 Secondary malignant neoplasm of brain: Secondary | ICD-10-CM | POA: Diagnosis not present

## 2021-11-05 DIAGNOSIS — I10 Essential (primary) hypertension: Secondary | ICD-10-CM | POA: Diagnosis not present

## 2021-11-05 DIAGNOSIS — C490 Malignant neoplasm of connective and soft tissue of head, face and neck: Secondary | ICD-10-CM | POA: Diagnosis not present

## 2021-11-05 DIAGNOSIS — Z79899 Other long term (current) drug therapy: Secondary | ICD-10-CM | POA: Diagnosis not present

## 2021-11-05 DIAGNOSIS — C7951 Secondary malignant neoplasm of bone: Secondary | ICD-10-CM | POA: Diagnosis not present

## 2021-11-05 DIAGNOSIS — C61 Malignant neoplasm of prostate: Secondary | ICD-10-CM | POA: Diagnosis not present

## 2021-11-05 DIAGNOSIS — E785 Hyperlipidemia, unspecified: Secondary | ICD-10-CM | POA: Diagnosis not present

## 2021-11-05 DIAGNOSIS — Z510 Encounter for antineoplastic radiation therapy: Secondary | ICD-10-CM | POA: Diagnosis not present

## 2021-11-05 LAB — RAD ONC ARIA SESSION SUMMARY
Course Elapsed Days: 5
Plan Fractions Treated to Date: 4
Plan Prescribed Dose Per Fraction: 3 Gy
Plan Total Fractions Prescribed: 10
Plan Total Prescribed Dose: 30 Gy
Reference Point Dosage Given to Date: 12 Gy
Reference Point Session Dosage Given: 3 Gy
Session Number: 4

## 2021-11-05 LAB — CBC
HCT: 30.6 % — ABNORMAL LOW (ref 39.0–52.0)
Hemoglobin: 10 g/dL — ABNORMAL LOW (ref 13.0–17.0)
MCH: 32.2 pg (ref 26.0–34.0)
MCHC: 32.7 g/dL (ref 30.0–36.0)
MCV: 98.4 fL (ref 80.0–100.0)
Platelets: 224 10*3/uL (ref 150–400)
RBC: 3.11 MIL/uL — ABNORMAL LOW (ref 4.22–5.81)
RDW: 17 % — ABNORMAL HIGH (ref 11.5–15.5)
WBC: 4.8 10*3/uL (ref 4.0–10.5)
nRBC: 0 % (ref 0.0–0.2)

## 2021-11-05 MED ORDER — BISACODYL 10 MG RE SUPP
10.0000 mg | Freq: Every day | RECTAL | Status: DC
  Administered 2021-11-05: 10 mg via RECTAL
  Filled 2021-11-05 (×2): qty 1

## 2021-11-05 MED ORDER — FLEET ENEMA 7-19 GM/118ML RE ENEM: 1.0000 | ENEMA | Freq: Every day | RECTAL | Status: DC | PRN

## 2021-11-05 NOTE — Progress Notes (Signed)
Physical Therapy Session Note  Patient Details  Name: Jason [name] MRN: [id number] Date of Birth: 14-Jun-1955  Today's Date: 11/05/2021 PT Individual Time: 1300-1330 PT Individual Time Calculation (min): 30 min   Short Term Goals: Week 3:  PT Short Term Goal 1 (Week 3): Pt will perform SBT with max A x 1 consistently PT Short Term Goal 2 (Week 3): Pt will perform bed mobility max A x 1 PT Short Term Goal 3 (Week 3): Pt and wife will initiate hoyer lift training  Skilled Therapeutic Interventions/Progress Updates:    Pt received in [place], agreeable to therapy. No complaint of pain. Session focused on training pt's wife for slideboard transfer. Pt navigated PWC throughout session with supervision. Pt demoes emerging ability to direct his care for transfers. Pt performed slideboard transfer to mat table with therapist providing max  A and +2 standing by for safety, demoed improved ability to push through BLE for transfer and performing more of transfer than previous sessions with this therapist. Pt then performed slideboard transfer with assistance from his wife, with therapist providing min +2 assist back to chair. Discussed and demonstrated repositioning pt in chair. Pt then returned to room and remained in chair with his wife present and all needs in reach.   Therapy Documentation Precautions:  Precautions Precautions: Fall, Cervical Precaution Comments: C6 laminectomy, MAP >65 Restrictions Weight Bearing Restrictions: Yes RUE Weight Bearing: Weight bearing as tolerated General:      Therapy/Group: Individual Therapy  [name] 11/05/2021, 4:10 PM

## 2021-11-05 NOTE — Progress Notes (Signed)
Occupational Therapy Session Note  Patient Details  Name: Jason [name] MRN: [id number] Date of Birth: September 25, 1955  Today's Date: 11/05/2021 OT Individual Time: 1130-1156 OT Individual Time Calculation (min): 26 min    Short Term Goals: Week 3:  OT Short Term Goal 1 (Week 3): Pt will perform sitting EOB/EOM with min A for RUE simple functional or reaching task OT Short Term Goal 2 (Week 3): Pt will doff/don pull over shirt with min A at bed level with HOB elevated OT Short Term Goal 3 (Week 3): Pt will thread BLE into pants with max A seated in half circle sitting at bed level  Skilled Therapeutic Interventions/Progress Updates:    Pt resting in PWC upon arrival. OT intervention with focus on RUE strengthening through therapeutic activities. 1# weight cuff donned on RUE wrist and pt tapped small beach ball 6x10 with rest breaks.Pt also performed tricep extensions with 1# weight 3x10.PWC mobility with supervision. Pt returned to room and remained in w/c. Pt performed pressure relief at end of session. Wife present in room.  Therapy Documentation Precautions:  Precautions Precautions: Fall, Cervical Precaution Comments: C6 laminectomy, MAP >65 Restrictions Weight Bearing Restrictions: Yes RUE Weight Bearing: Weight bearing as tolerated General:   Vital Signs:   Pain: Pain Assessment Pain Scale: 0-10 Pain Score: 0-No pain   Therapy/Group: Individual Therapy  Leroy Libman 11/05/2021, 12:06 PM

## 2021-11-05 NOTE — Progress Notes (Signed)
Occupational Therapy Session Note  Patient Details  Name: Jason [name] MRN: [id number] Date of Birth: Nov 02, 1955  Today's Date: 11/05/2021 OT Individual Time: 1914-7829 OT Individual Time Calculation (min): 75 min    Short Term Goals: Week 3:  OT Short Term Goal 1 (Week 3): Pt will perform sitting EOB/EOM with min A for RUE simple functional or reaching task OT Short Term Goal 2 (Week 3): Pt will doff/don pull over shirt with min A at bed level with HOB elevated OT Short Term Goal 3 (Week 3): Pt will thread BLE into pants with max A seated in half circle sitting at bed level  Skilled Therapeutic Interventions/Progress Updates:    Pt resting in bed upon arrival with wife present. OT intervention with focus on UB dressing at bed level with HOB elevated. Pt requires assistance to come into long sitting with UB forward to attempt pulling shirt up towards his shoulders. Once shirt up to shoulders, pt able to doff shirt without further assistance. Pt donned pullover shirt with min a, again requiring assistance to pull down over trunk. Pt's wife assisted with threading BLE into pants. Pt rolling in bed with mod/max A to facilitate pulling pants over hips. Supine>sit EOB with tot A. SB transfer to [place] with max A+2. Pt maneuvered to gym and engaged in table tasks usning BUE to manipulate large lego blocks. Pt using LUE supported on table to stabilize blocks while manipulating with RUE. Pt returned to room and remained in [facility] with all needs within reach. WIfe present.   Therapy Documentation Precautions:  Precautions Precautions: Fall, Cervical Precaution Comments: C6 laminectomy, MAP >65 Restrictions Weight Bearing Restrictions: Yes RUE Weight Bearing: Weight bearing as tolerated   Pain: Pain Assessment Pain Scale: 0-10 Pain Score: 0-No pain   Therapy/Group: Individual Therapy  Leroy Libman 11/05/2021, 10:30 AM

## 2021-11-05 NOTE — Progress Notes (Signed)
Physical Therapy Session Note  Patient Details  Name: Jason [name] MRN: [id number] Date of Birth: [birth year]  Today's Date: 11/05/2021 PT Individual Time: 4287-6811 PT Individual Time Calculation (min): 68 min   Short Term Goals: Week 3:  PT Short Term Goal 1 (Week 3): Pt will perform SBT with max A x 1 consistently PT Short Term Goal 2 (Week 3): Pt will perform bed mobility max A x 1 PT Short Term Goal 3 (Week 3): Pt and wife will initiate hoyer lift training  Skilled Therapeutic Interventions/Progress Updates:    Pt received sitting tilted back in power wheelchair ([place]) with his wife, [name], present and pt agreeable to therapy session with plan to perform gait training with body weight support (BWS) using the litegait harness.   Dependent assist donned R LE Sprystep MAX GRAFO and L LE Matrix Max GRAFO in preparation for gait training to provide increased knee extension support and protect pt's ankles.  Pt performed PWC mobility ~152f to day room with distant supervision - pt able to turn on/off w/c and manage to change through settings to adjust positioning of seat with occasional cuing. Pt able to park PChelse[place]xt to mat table in preparation for a transfer without cuing and pt able to recall correct sequencing of setting up the transfer with min cuing.   Pt performed R lateral trunk lean using UE support on w/c armrest during dependent assist L LE management and transfer board placement. L lateral scoot transfer PWC>EOM, using transfer board, with max assist of 1 for trunk control and scooting hips as well as dependent assist to manage LE positioning.  Sitting EOM start to don liteagait harness then transition sitting>supine with +3 total assist for time management and donned remainder of harness in supine. Returned to sitting with +3 assist.    Sit>stand EOM>B UE support on litegait harness with the litegait machine performing >90% of the work to lift pt into standing - therapists on  each LE to facilitate increased hip/knee extension with cuing for pt to bring trunk upright and hold head upright.  Gait training 336f+ 7221fith significant BWS via litegait harness with 3 person assist (1 person driving litegait and 2 therapists on either LE to facilitate gait mechanics)  - demonstrates active ability to extend B LEs in stance phase given verbal cuing and increased time (L>R), but difficulty sustaining that muscle contraction for the duration of stance  - requires total/dependent assist to advance and position each LE during swing phase *During 2nd gait trail repositioned handles of litegait to allow L arm to be lower due to shoulder pain/discomfort, while still allowing higher R UE support  *pt relying on harness support for trunk control and balance throughout with harness slightly higher on pt's abdomen/chest for improved support  Vitals after 1st walk: BP 140/81 (MAP 100), HR 66bpm   Sitting in PWC doffed harness with pt utilizing reclining feature to remove strap between his legs.   Pt becomes emotional/tearful about his excitement participating in gait training today with therapist providing emotional support.  Doffed AFOs, skin assessed and intact.  At end of session, pt left seated in power wheelchair with needs in reach, gait belt utilized to avoid excessive hip abduction, seat belt on, and pt's wife present.    Therapy Documentation Precautions:  Precautions Precautions: Fall, Cervical Precaution Comments: C6 laminectomy, MAP >65 Restrictions Weight Bearing Restrictions: Yes RUE Weight Bearing: Weight bearing as tolerated   Pain: Reports some L shoulder discomfort  with positioning on litegait handle during 1st gait trial; therefore, repositioned with no pain during 2nd trial. At end of session reports he feels like his legs are sore from the activity.    Therapy/Group: Individual Therapy  Tawana Scale , PT, DPT, NCS, CSRS 11/05/2021, 7:59 AM

## 2021-11-05 NOTE — Progress Notes (Signed)
Patient ID: Jason [name], male   DOB: December 02, 1955, 66 y.o.   MRN: [id number]  SW met with pt and pt wife in room to provide updates from team conference, and d/c date will remain 9/26. SW answered questions in relation to discharge process, HHA, and DME. Pt wife reports that the contractor installing shower is also installing ramp and this should begin tomorrow. Family meeting scheduled for Thursday (9/14) at 10am. SW will continue to assess for d/c needs.   Cecile Sheerer, MSW, LCSWA Office: [phone] Cell: [phone] Fax: [phone]

## 2021-11-05 NOTE — Progress Notes (Signed)
RN reached out to patient's wife to assess how patient was doing since starting radiation treatment.    Reports patient doing well, no needs at this time.  Encouraged patient and wife to reach out if anything arises.

## 2021-11-05 NOTE — Patient Care Conference (Signed)
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 Courtney Heys Social Worker Present: Loralee Pacas, Guy Nurse Present: Other (comment) Tacy Learn, RN) PT Present: Ailene Rud, PT OT Present: Willeen Cass, OT;Roanna Epley, COTA PPS Coordinator present : Gunnar Fusi, [name]     Current Status/Progress Goal Weekly Team Focus  Bowel/Bladder   Existing foley d/t urinary retention. Pt is on the bowel program. LBM: 9/11  Verbalize importance of bowel program & foley care.  Educate pt & family on foley care & bowel program.   Swallow/Nutrition/ Hydration             ADL's   UB bathing/dressing at bed level with HOB elevated-min A; LB bahting/dressing bed level with max/tot A; bed mobility-max A; SB transfers-max A+2; static sitting balance-min A for 30-60 seconds on level surface (EOM)  mod A pt BADL's and indep for caregiver with DME  bed mobility, sitting balance, BADLs, education   Mobility   max A bed mobility, min sitting balance, max x 2 SBT, PWC eval,  mod A bed mobility, min A bed to chair transfers  transfers, PWC, Engineer, civil (consulting) Observations            Pain   Pt denies pain.  Remain pain free  Assess pain q shift & PRN    Skin   healed incision to back of neck - OTA & CDI; blanchable redness to bilat. heels; bruising to bilat. arms/abd  Remain free from any new skin breakdown.  Assess skin q shift & PRN     Discharge Planning:  D/c to home with support from wife as primary caregiver. Pt will radiation schedule changed: 9/08-9/20pm. Transportation scheduled with [facility] for p/u. Pt wife is working on remodeling bathroom (walk in shower) and ramp.   Team Discussion: Prostate CA with METs to CNS. Chronic foley, bowel program. Pain controlled with PRNs. Incisions are CDI. Lovenox teaching needed. Quad cough teaching needed. House will be ready for patient with ramp by the 26th. Working on sitting balance. Hoyer and hospital bed at home.  Patient on target to meet rehab goals: yes, patient making slow progress  *See Care Plan and progress notes for long and short-term goals.   Revisions to Treatment Plan:  Bowel program to restart. Medication adjustments. Family meeting  Teaching Needs: Medications, safety, hoyer training, lovenox training, bowel program, quad cough teaching, foley care   Current Barriers to Discharge: Decreased caregiver support, Home enviroment access/layout, Incontinence, Neurogenic bowel and bladder, Weight, and Weight bearing restrictions  Possible Resolutions to Barriers: Family education, medication education, hoyer education, lovenox education, bowel program, foley education, order recommended DME     Medical Summary Current Status: incontinent- transfers  via [employer]- L shoulder pain- incisions with steristrips-  Barriers to Discharge: Decreased family/caregiver support;Pending chemo/radiation;Home enviroment access/layout;Medical stability;Weight bearing restrictions;Other (comments)  Barriers to Discharge Comments: process of getting ramp and bathroom reno- wants to stay til 9/26- DVT has resolved, but after d/w Oncology, will con't tx dose lovenox for DVT's. Possible Resolutions  to [employer]: making progress with min A bathing/dressing UB; bed level- working on sitting balance- will arrange family conference- 9/14- 10am- to start hoyer training this week- LUE cannot assist, so cannot do stedy- needs hospital bed and hoyer- already ordered- d/c- 9/26   Continued Need for Acute Rehabilitation Level of Care: The patient requires daily medical management by a physician with specialized training in physical medicine and rehabilitation for the following reasons: Direction of a multidisciplinary physical rehabilitation program to maximize functional independence : Yes Medical management of patient stability for increased activity during participation in an intensive rehabilitation regime.: Yes Analysis of laboratory values and/or radiology reports with any subsequent need for medication adjustment and/or medical intervention. : Yes   I attest that I was present, lead the team conference, and concur with the assessment and plan of the team.   Ernest Pine 11/05/2021, 3:02 PM

## 2021-11-05 NOTE — Progress Notes (Signed)
PROGRESS NOTE   Subjective/Complaints:  Pt reports a little coughing, but doesn't need quad coughing- feels like cough is better.  Asking for me to see if wife has additional questions and to come back.  Radiation started last Thursday- more tired, but also taking benzo for radiation and thinks that's contributing.   LBM with bowel program overnight.   ROS:  Pt denies SOB, abd pain, CP, N/V/C/D, and vision changes   Objective:   VAS [place] LOWER EXTREMITY VENOUS (DVT)  Result Date: 11/04/2021  Lower Venous DVT Study Patient Name:  Jason [name]  Date of Exam:   11/04/2021 Medical Rec #: [id number]         Accession #:    [id number] Date of Birth: [birth year]         Patient Gender: M Patient Age:   66 years Exam Location:  [facility] Procedure:      VAS [place] LOWER EXTREMITY VENOUS (DVT) Referring Phys: Jennye Boroughs --------------------------------------------------------------------------------  Indications: Follow up left gastrocnemius clot.  Comparison Study: 10-18-2021 Prior bilateral lower extremity venous showed acute                   gastrocnemius DVT left. Performing Technologist: Darlin Coco RDMS, RVT  Examination Guidelines: A complete evaluation includes B-mode imaging, spectral Doppler, color Doppler, and power Doppler as needed of all accessible portions of each vessel. Bilateral testing is considered an integral part of a complete examination. Limited examinations for reoccurring indications may be performed as noted. The reflux portion of the exam is performed with the patient in reverse Trendelenburg.  +-----+---------------+---------+-----------+----------+--------------+ RIGHTCompressibilityPhasicitySpontaneityPropertiesThrombus Aging +-----+---------------+---------+-----------+----------+--------------+ CFV  Full           Yes      Yes                                  +-----+---------------+---------+-----------+----------+--------------+   +---------+---------------+---------+-----------+----------+--------------+ LEFT     CompressibilityPhasicitySpontaneityPropertiesThrombus Aging +---------+---------------+---------+-----------+----------+--------------+ CFV      Full           Yes      Yes                                 +---------+---------------+---------+-----------+----------+--------------+ SFJ      Full                                                        +---------+---------------+---------+-----------+----------+--------------+ FV Prox  Full                                                        +---------+---------------+---------+-----------+----------+--------------+ FV Mid   Full                                                        +---------+---------------+---------+-----------+----------+--------------+  FV DistalFull                                                        +---------+---------------+---------+-----------+----------+--------------+ PFV      Full                                                        +---------+---------------+---------+-----------+----------+--------------+ POP      Full           Yes      Yes                                 +---------+---------------+---------+-----------+----------+--------------+ PTV      Full                                                        +---------+---------------+---------+-----------+----------+--------------+ PERO     Full                                                        +---------+---------------+---------+-----------+----------+--------------+ Gastroc  Full                                                        +---------+---------------+---------+-----------+----------+--------------+    Summary: RIGHT: - No evidence of common femoral vein obstruction.  LEFT: - There is no evidence of deep vein thrombosis in  the lower extremity.  - No cystic structure found in the popliteal fossa.  *See table(s) above for measurements and observations. Electronically signed by Monica Martinez MD on 11/04/2021 at 4:19:13 PM.    Final     Recent Labs    11/05/21 0549  WBC 4.8  HGB 10.0*  HCT 30.6*  PLT 224   Recent Labs    11/04/21 0739  NA 137  K 3.6  CL 106  CO2 24  GLUCOSE 96  BUN 16  CREATININE 0.47*  CALCIUM 8.6*      Intake/Output Summary (Last 24 hours) at 11/05/2021 1014 Last data filed at 11/05/2021 0930 Gross per 24 hour  Intake 649 ml  Output 2275 ml  Net -1626 ml        Physical Exam: Vital Signs Blood pressure 134/67, pulse 63, temperature 98.1 F (36.7 C), temperature source Oral, resp. rate 16, height $RemoveBe'5\' 9"'VkTHGQYfN$  (1.753 m), weight 99.9 kg, SpO2 99 %.   General: awake, alert, appropriate, sitting up slightly in bed; wife not there yet;  NAD HENT: conjugate gaze; oropharynx moist CV: regular rate; no JVD Pulmonary: CTA B/L; no W/R/R- good air movement GI: soft, NT, ND, (+)BS Psychiatric: appropriate Neurological: Ox3 Genitourinary: Golden urine  in bag, sides of catheter tubing stained red  Musculoskeletal: L shoulder K tape in place- shoulder still subluxed ~1"    Cervical back: Neck supple.   Moving all 4 extremities today; prior exams:  RUE- biceps 5/5; WE 4-/5; Triceps 2+/5; Grip 3-/5; FA 2-/5 LUE- biceps 5-/5; WE 4/5; Triceps 2+/5' Grip 3+/5; FA 2-/5 RLE- 1to 1+/5 HE, KE and trace at anke LLE- HF 1+/5; KE -/5; DF 1/5; PF 2/5; EHL 1/5  Decreased to light touch from L1 inferiorly; note sacrals not tested RLE spasms decreased; none in LLE  Assessment/Plan: 1. Functional deficits which require 3+ hours per day of interdisciplinary therapy in a comprehensive inpatient rehab setting. Physiatrist is providing close team supervision and 24 hour management of active medical problems listed below. Physiatrist and rehab team continue to assess barriers to discharge/monitor  patient progress toward functional and medical goals  Care Tool:  Bathing    Body parts bathed by patient: Face   Body parts bathed by helper: Right arm, Left arm, Chest, Abdomen, Front perineal area, Buttocks, Left lower leg, Right lower leg, Left upper leg, Right upper leg     Bathing assist Assist Level: Dependent - Patient 0%     Upper Body Dressing/Undressing Upper body dressing   What is the patient wearing?: Pull over shirt    Upper body assist Assist Level: Minimal Assistance - Patient > 75%    Lower Body Dressing/Undressing Lower body dressing      What is the patient wearing?: Pants     Lower body assist Assist for lower body dressing: Total Assistance - Patient < 25%     Toileting Toileting    Toileting assist Assist for toileting: Dependent - Patient 0%     Transfers Chair/bed transfer  Transfers assist  Chair/bed transfer activity did not occur: Safety/medical concerns  Chair/bed transfer assist level: 2 Helpers     Locomotion Ambulation   Ambulation assist   Ambulation activity did not occur: Safety/medical concerns          Walk 10 feet activity   Assist  Walk 10 feet activity did not occur: Safety/medical concerns        Walk 50 feet activity   Assist Walk 50 feet with 2 turns activity did not occur: Safety/medical concerns         Walk 150 feet activity   Assist Walk 150 feet activity did not occur: Safety/medical concerns         Walk 10 feet on uneven surface  activity   Assist Walk 10 feet on uneven surfaces activity did not occur: Safety/medical concerns         Wheelchair     Assist Is the patient using a wheelchair?: Yes Type of Wheelchair: Power Wheelchair activity did not occur: Safety/medical concerns  Wheelchair assist level: Supervision/Verbal cueing Max wheelchair distance: >300    Wheelchair 50 feet with 2 turns activity    Assist    Wheelchair 50 feet with 2 turns activity  did not occur: Safety/medical concerns   Assist Level: Supervision/Verbal cueing   Wheelchair 150 feet activity     Assist  Wheelchair 150 feet activity did not occur: Safety/medical concerns   Assist Level: Supervision/Verbal cueing   Blood pressure 134/67, pulse 63, temperature 98.1 F (36.7 C), temperature source Oral, resp. rate 16, height $RemoveBe'5\' 9"'QhqAPkGyO$  (1.753 m), weight 99.9 kg, SpO2 99 %.  Medical Problem List and Plan: 1. 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  Antithrombotics: -DVT/anticoagulation:  Pharmaceutical: Lovenox.               -antiplatelet therapy: ASA 3. Pain: continue Oxycodone prn. 8/28- pt reports no significant pain- and doesn't want spasticity meds at this time. 9/12- pain controlled- con't regimen prn   4. Mood/Behavior/Sleep: LCSW to follow for evaluation and support.              -antipsychotic agents: N/A 5. Neuropsych/cognition: This patient is capable of making decisions on his own behalf. 6. Skin/Wound Care: Routine pressure relief measures              --monitor incision for healing.  7. Fluids/Electrolytes/Nutrition: Monitor I/O. Check CMET in am.  8. Prostate AdenoCA w/mets: Loading dose Firmagon 240 mg 10/06/21 per Dr. Tasia Catchings --Charlesetta Garibaldi changed to [facility] due to insurance coverage--next dose around 09/13. --Simulation for XRT 08/31 at 2 pm at [facility] 9/6- has appt 10/11 for Eliguard and MD f/u.  9/12- started radiation last Thursday til 9/25 M-F 9. Dysphagia: pt now on regular/thins 10. Neurogenic bladder: continue foley for now  8/31- pt doesn't want to remove to  see if can void; or to do in/out caths- thinks too much stress on family.   9/3- still has some  brownish  urine- see through. 9/7 urine generally clear. May have some periodic blood d/t anticoagulation 11. Neurogenic bowel: Will get KUB to determine stool burden --continue Colace in am with suppository after supper.  8/25- had good results with bowel program 8/27- good results with bowel program yesterday 8/28- having bowel accidents- and BM with bowel program last 3 nights-  8/29- 2 small BM's last night after bowel program- con't regimen 9/2-9/3 Having results with bowel program nightly  9/5- Didn't have results last night- but had before- will con't to monitor 9/8 pt had large, timely result with fleet enema last night. Continue thru weekend and perhaps convert back to suppository Monday.  9/11 Had large BM with enema, will continue tonight, consider restart suppository tomorrow 9/12- will change back to dulcolax supp as of tonight- still need dig stim- restarted bowel program 12. Sepsis/Likely aspiration PNA: Now on Augmentin--antibiotic D#5/10. BC pend-->neg so far.              --Follow up CXR in am.              --pulmonary hygiene for atelectasis/Asymmetric volume loss on right.  13. Abnormal LFTs: Improved overall except for elevation in Aphos--315. Will recheck in am  8/25- Alk phos up to 457; AST 62 and ALT 96- except for alk phos, they are heading downwards- con't to monitor weekly.  14. ABLA: improving, monitor weekly   15. Leucocytosis: Recheck in am. Monitor for fevers/other signs of infection.    8/25- WBC 17.1k- on Augmentin- will recheck labs in AM  -WBC improved to 15.6 10/19/21  8/28- Down to 10.8k- con't to monitor  8/31- WBC 9.5k- doing better            9/3 - 7.9--->4.6 on 9/9, improved 16. Hypotension: continue midodrine.   9/11 BP has been stable, denies symptoms,  continue midodrine  17. DVT left gastrocnemius vein  -8/26 Noted Lovenox was increased 8/25 to 157m BID, Distal DVT, will recheck in 1-2 weeks, compression stockings,   9/6- needs to go home on  Tx lovenox- will need a minimum of 3 months due to DVT- cannot switch to Eliquis, etc since cancer related DVTs need Lovenox!  9/12- New Dopplers show no DVT- will d/w Oncology the plan since no DVT now, HOWEVER did have one and Cancer dramatically increases his risk.   18. Spasticity  8/28- pt doesn't want spasticity meds- isn't painful or annoying.   9/6- Spasticity somewhat better today 19. Dispo  8/28- will d/c staples and [facility] 8/29- need to see about [facility]-supposed to be given 9/12- Oncology to handle it- L/M/via PA about this.  8/30- per pt, they aren't using Firmagen- are using other product- will d/w Oncology more 9/2- XRT starts  9/12- 9/26   - will likely need power w/c with ROHO cushion- will add details as we know them  20. Left hand PIP digit swelling: placed OT order for kinesiotaping and nursing order for ice application-improved  I spent a total of  52  minutes on total care today- >50% coordination of care- due to d/w Oncology and team conference today - also stopping back to speak with wife.    LOS: 19 days A FACE TO FACE EVALUATION WAS PERFORMED  Treyvion Durkee 11/05/2021, 10:14 AM

## 2021-11-06 ENCOUNTER — Other Ambulatory Visit: Payer: Self-pay

## 2021-11-06 ENCOUNTER — Ambulatory Visit: Payer: BC Managed Care – PPO

## 2021-11-06 ENCOUNTER — Telehealth: Payer: Self-pay

## 2021-11-06 ENCOUNTER — Other Ambulatory Visit (HOSPITAL_COMMUNITY): Payer: Self-pay

## 2021-11-06 ENCOUNTER — Ambulatory Visit
Admit: 2021-11-06 | Discharge: 2021-11-06 | Disposition: A | Discharge status: Home / Self Care | Payer: BC Managed Care – PPO | Attending: Radiation Oncology | Admitting: Radiation Oncology

## 2021-11-06 ENCOUNTER — Other Ambulatory Visit: Payer: Self-pay | Admitting: Oncology

## 2021-11-06 ENCOUNTER — Other Ambulatory Visit: Payer: Self-pay | Admitting: Hematology

## 2021-11-06 DIAGNOSIS — C490 Malignant neoplasm of connective and soft tissue of head, face and neck: Secondary | ICD-10-CM | POA: Diagnosis not present

## 2021-11-06 DIAGNOSIS — Z79899 Other long term (current) drug therapy: Secondary | ICD-10-CM | POA: Diagnosis not present

## 2021-11-06 DIAGNOSIS — C7931 Secondary malignant neoplasm of brain: Secondary | ICD-10-CM | POA: Diagnosis not present

## 2021-11-06 DIAGNOSIS — Z510 Encounter for antineoplastic radiation therapy: Secondary | ICD-10-CM | POA: Diagnosis not present

## 2021-11-06 DIAGNOSIS — C61 Malignant neoplasm of prostate: Secondary | ICD-10-CM | POA: Diagnosis not present

## 2021-11-06 DIAGNOSIS — C7951 Secondary malignant neoplasm of bone: Secondary | ICD-10-CM | POA: Diagnosis not present

## 2021-11-06 DIAGNOSIS — E785 Hyperlipidemia, unspecified: Secondary | ICD-10-CM | POA: Diagnosis not present

## 2021-11-06 DIAGNOSIS — I10 Essential (primary) hypertension: Secondary | ICD-10-CM | POA: Diagnosis not present

## 2021-11-06 LAB — RAD ONC ARIA SESSION SUMMARY
Course Elapsed Days: 6
Plan Fractions Treated to Date: 5
Plan Prescribed Dose Per Fraction: 3 Gy
Plan Total Fractions Prescribed: 10
Plan Total Prescribed Dose: 30 Gy
Reference Point Dosage Given to Date: 15 Gy
Reference Point Session Dosage Given: 3 Gy
Session Number: 5

## 2021-11-06 MED ORDER — BISACODYL 10 MG RE SUPP
10.0000 mg | Freq: Every day | RECTAL | Status: DC
  Administered 2021-11-06 – 2021-11-18 (×13): 10 mg via RECTAL
  Filled 2021-11-06 (×13): qty 1

## 2021-11-06 MED ORDER — LEUPROLIDE ACETATE (3 MONTH) 22.5 MG ~~LOC~~ KIT: 22.5000 mg | PACK | Freq: Once | SUBCUTANEOUS | Status: DC

## 2021-11-06 MED ORDER — FLUTICASONE PROPIONATE 50 MCG/ACT NA SUSP
1.0000 | Freq: Every day | NASAL | Status: DC
  Administered 2021-11-06 – 2021-11-19 (×14): 1 via NASAL
  Filled 2021-11-06: qty 16

## 2021-11-06 MED ORDER — LUPRON DEPOT (3-MONTH) 22.5 MG IM KIT
PACK | INTRAMUSCULAR | 0 refills | Status: DC
  Filled 2021-11-06: qty 1, 90d supply, fill #0

## 2021-11-06 NOTE — Telephone Encounter (Signed)
Pt tentative discharge date is 9/19.   Please schedule patient for lab/MD (hosp follow up)/ Eligard at the end of next week and inform wife of appt.

## 2021-11-06 NOTE — Progress Notes (Signed)
Returned from [place], LPN

## 2021-11-06 NOTE — Telephone Encounter (Signed)
Thanks. Will keep appts as is per wife request.

## 2021-11-06 NOTE — Progress Notes (Signed)
Jason [name]   DOB:[birth year]   YP#:[id number]   IWP#:[id number]  Oncology follow up note   Subjective: Pt is tolerating RT well, PT/OT going well also. Per pt, he is planned to be discharged on 9/26.    Objective:  Vitals:   11/06/21 1419 11/06/21 1957  BP: (!) 147/83 137/69  Pulse: 73 65  Resp: 15 19  Temp: 98.3 F (36.8 C) 98.8 F (37.1 C)  SpO2: 99% 98%    Body mass index is 32.52 kg/m.  Intake/Output Summary (Last 24 hours) at 11/06/2021 2003 Last data filed at 11/06/2021 1854 Gross per 24 hour  Intake 712 ml  Output 1650 ml  Net -938 ml     Sclerae unicteric  Oropharynx clear  No peripheral adenopathy  Lungs clear -- no rales or rhonchi  Heart regular rate and rhythm  Abdomen benign  MSK no focal spinal tenderness, no peripheral edema    CBG (last 3)  No results for input(s): "GLUCAP" in the last 72 hours.   Labs:   Urine Studies No results for input(s): "UHGB", "CRYS" in the last 72 hours.  Invalid input(s): "UACOL", "UAPR", "USPG", "UPH", "UTP", "UGL", "UKET", "UBIL", "UNIT", "UROB", "ULEU", "UEPI", "UWBC", "URBC", "UBAC", "CAST", "UCOM", "BILUA"  Basic Metabolic Panel: Recent Labs  Lab 11/04/21 0739  NA 137  K 3.6  CL 106  CO2 24  GLUCOSE 96  BUN 16  CREATININE 0.47*  CALCIUM 8.6*   GFR Estimated Creatinine Clearance: 110.2 mL/min (A) (by C-G formula based on SCr of 0.47 mg/dL (L)). Liver Function Tests: No results for input(s): "AST", "ALT", "ALKPHOS", "BILITOT", "PROT", "ALBUMIN" in the last 168 hours. No results for input(s): "LIPASE", "AMYLASE" in the last 168 hours. No results for input(s): "AMMONIA" in the last 168 hours. Coagulation profile No results for input(s): "INR", "PROTIME" in the last 168 hours.  CBC: Recent Labs  Lab 11/02/21 0609 11/05/21 0549  WBC 4.6 4.8  HGB 10.0* 10.0*  HCT 30.7* 30.6*  MCV 97.8 98.4  PLT 257 224   Cardiac Enzymes: No results for input(s): "CKTOTAL", "CKMB", "CKMBINDEX", "TROPONINI" in the  last 168 hours. BNP: Invalid input(s): "POCBNP" CBG: No results for input(s): "GLUCAP" in the last 168 hours. D-Dimer No results for input(s): "DDIMER" in the last 72 hours. Hgb A1c No results for input(s): "HGBA1C" in the last 72 hours. Lipid Profile No results for input(s): "CHOL", "HDL", "LDLCALC", "TRIG", "CHOLHDL", "LDLDIRECT" in the last 72 hours. Thyroid function studies No results for input(s): "TSH", "T4TOTAL", "T3FREE", "THYROIDAB" in the last 72 hours.  Invalid input(s): "FREET3" Anemia work up No results for input(s): "VITAMINB12", "FOLATE", "FERRITIN", "TIBC", "IRON", "RETICCTPCT" in the last 72 hours. Microbiology No results found for this or any previous visit (from the past 240 hour(s)).    Studies:  No results found.  Assessment: 66 y.o. male here for rehab   1.  Metastatic prostate cancer 2.  Neurogenic bladder 3.  Right parietal stroke 4.  Normocytic anemia 5.  Left gastrocnemius vein DVT  Plan:  -his repeated Doppler on November 04, 2021 showed resolved DVT.  He has no leg edema or pain.  However given his underlying metastatic cancer, limited mobility, he is at high risk for recurrent thrombosis.  I recommend anticoagulation indefinitely.  He was previously on Lovenox injection, which was switched to Eliquis to maintenance dose today, I yesterday spoke with pharmacist and Dr. Dagoberto Ligas and I agree with the switch.  -pt is due for Eligard. I have spoke with 4[facility]  pharmacist several times, is not covered by inpatient service. So I called in to [facility] outpatient pharmacy, however his insurance rest use other specialty pharmacy. It will require insurance approval. I explained to pt that it will take some time to get it approved and to be shipped to pt's home, so his wife can bring it to [place] and his nurse can give it to him. If it does not work out, I have informed his primary oncologist Dr. Tasia Catchings to set up his outpatient injection right after his hospital discharge which s  planned for September 26.   -His wife also told me he is scheduled for PET scan on September 27. Hope he can make it.  -continue palliative RT and rehab  -I will f/u as needed.    Truitt Merle, MD 11/06/2021

## 2021-11-06 NOTE — Progress Notes (Signed)
Physical Therapy Session Note  Patient Details  Name: Jason [name] MRN: [id number] Date of Birth: 11/23/1955  Today's Date: 11/06/2021 PT Individual Time: 1035-1205 PT Individual Time Calculation (min): 90 min   Short Term Goals: Week 3:  PT Short Term Goal 1 (Week 3): Pt will perform SBT with max A x 1 consistently PT Short Term Goal 2 (Week 3): Pt will perform bed mobility max A x 1 PT Short Term Goal 3 (Week 3): Pt and wife will initiate hoyer lift training  Skilled Therapeutic Interventions/Progress Updates: Pt presented in PWC agreeable to therapy. Pt denies pain during session. Discussed with pt and wife [name] introduction of hoyer lift, continued hands on Slide board transfers, and attempt at car transfer most likely Fri. Pt and wife verbalized understanding and in agreement. Pt then navigated to day room and [name] set up Slide board with minA and increased time. [name] then performed Slide board transfer to mat max/?totalA but was able to demonstrate all appropriate cues and good safety awareness. PTA did cue [name] to be cautious of BLE placement and possibility of extensor tone when pt attempting to scoot. [name] then performed posteiror scoot of pt with PTA providing mod cues and her LE placement to appropriately block pt's LE. Pt then participated in various sitting balance activities including pulling self up to midline using PTA's hands, attempting to achieve midline and sustain. Progressed pt to attempt to let go of pt's hands (gently) which pt was able to sustain balance unsupported for 3-5 sec. Pt also performed sitting up to midline placing hands on lap and then attempting to sustain unsupported sitting which pt was only able to sustain for 1-3 sec. After rest break pt then performed same activity but placing hands on mat which pt was able to sustain for 3:30! Pt then participated kicking ball for LE activity/strengthening with mat elevated to decrease friction. Pt was noted to  be able to slightly perform hip flexion and DF to neutral on RLE. Pt then attempted to perform small kicks while sitting unsupported which pt was able to achieve 3/5 on LLE, however LLE was too fatigued. Pt's wife was then brought back to day room and performed Slide board transfer to return to PWC. Due to pt's fatigue increased cues and noted increased effort from [name] was noted. PTA did demonstrate and assist with posterior scoot of pt in PWC. PTA will recommend to primary PT to continue performing Slide board transfer with wife when able. Pt then navigated back to room with wife with current needs met.      Therapy Documentation Precautions:  Precautions Precautions: Fall, Cervical Precaution Comments: C6 laminectomy, MAP >65 Restrictions Weight Bearing Restrictions: Yes RUE Weight Bearing: Weight bearing as tolerated General:   Vital Signs: Therapy Vitals Temp: 98.3 F (36.8 C) Temp Source: Oral Pulse Rate: 73 Resp: 15 BP: (!) 147/83 Patient Position (if appropriate): Lying Oxygen Therapy SpO2: 99 % O2 Device: Room Air Pain:   Mobility:   Locomotion :    Trunk/Postural Assessment :    Balance:   Exercises:   Other Treatments:      Therapy/Group: Individual Therapy  Rosita DeChalus 11/06/2021, 4:31 PM  

## 2021-11-06 NOTE — Progress Notes (Signed)
Occupational Therapy Session Note  Patient Details  Name: Jason [name] MRN: [id number] Date of Birth: [birth year]  Today's Date: 11/06/2021 OT Individual Time: 4765-4650 OT Individual Time Calculation (min): 71 min    Short Term Goals: Week 3:  OT Short Term Goal 1 (Week 3): Pt will perform sitting EOB/EOM with min A for RUE simple functional or reaching task OT Short Term Goal 2 (Week 3): Pt will doff/don pull over shirt with min A at bed level with HOB elevated OT Short Term Goal 3 (Week 3): Pt will thread BLE into pants with max A seated in half circle sitting at bed level  Skilled Therapeutic Interventions/Progress Updates:    Pt resting in bed upon arrival with wife present. OT intervention with focus on dressing at bed level and ongoing educaiton. Pt's wife provided assistance/supervision appropriately during UB dressing tasks. Pt's wife assisted with pulling pants over hips and assisting with rolling in bed. SB transfer to [facility] with max A+2. Discussed recommended supplies. Pt remained in [place] with wife present.  Therapy Documentation Precautions:  Precautions Precautions: Fall, Cervical Precaution Comments: C6 laminectomy, MAP >65 Restrictions Weight Bearing Restrictions: Yes RUE Weight Bearing: Weight bearing as tolerated Pain:  Pt denies pain this morning  Therapy/Group: Individual Therapy  Leroy Libman 11/06/2021, 9:26 AM

## 2021-11-06 NOTE — Progress Notes (Signed)
PROGRESS NOTE   Subjective/Complaints:  Still coughing some, but thinks it's due to post nasal drip- has saline nasal spray but no Flonase.  Ate 100% tray.  Doesn't think needs resp or quad coughing.   ROS:   Pt denies SOB, abd pain, CP, N/V/C/D, and vision changes   Objective:   VAS [place] LOWER EXTREMITY VENOUS (DVT)  Result Date: 11/04/2021  Lower Venous DVT Study Patient Name:  Jason [name]  Date of Exam:   11/04/2021 Medical Rec #: [id number]         Accession #:    [id number] Date of Birth: [birth year]         Patient Gender: M Patient Age:   66 years Exam Location:  [facility] Procedure:      VAS [place] LOWER EXTREMITY VENOUS (DVT) Referring Phys: Jennye Boroughs --------------------------------------------------------------------------------  Indications: Follow up left gastrocnemius clot.  Comparison Study: 10-18-2021 Prior bilateral lower extremity venous showed acute                   gastrocnemius DVT left. Performing Technologist: Darlin Coco RDMS, RVT  Examination Guidelines: A complete evaluation includes B-mode imaging, spectral Doppler, color Doppler, and power Doppler as needed of all accessible portions of each vessel. Bilateral testing is considered an integral part of a complete examination. Limited examinations for reoccurring indications may be performed as noted. The reflux portion of the exam is performed with the patient in reverse Trendelenburg.  +-----+---------------+---------+-----------+----------+--------------+ RIGHTCompressibilityPhasicitySpontaneityPropertiesThrombus Aging +-----+---------------+---------+-----------+----------+--------------+ CFV  Full           Yes      Yes                                 +-----+---------------+---------+-----------+----------+--------------+   +---------+---------------+---------+-----------+----------+--------------+ LEFT      CompressibilityPhasicitySpontaneityPropertiesThrombus Aging +---------+---------------+---------+-----------+----------+--------------+ CFV      Full           Yes      Yes                                 +---------+---------------+---------+-----------+----------+--------------+ SFJ      Full                                                        +---------+---------------+---------+-----------+----------+--------------+ FV Prox  Full                                                        +---------+---------------+---------+-----------+----------+--------------+ FV Mid   Full                                                        +---------+---------------+---------+-----------+----------+--------------+  FV DistalFull                                                        +---------+---------------+---------+-----------+----------+--------------+ PFV      Full                                                        +---------+---------------+---------+-----------+----------+--------------+ POP      Full           Yes      Yes                                 +---------+---------------+---------+-----------+----------+--------------+ PTV      Full                                                        +---------+---------------+---------+-----------+----------+--------------+ PERO     Full                                                        +---------+---------------+---------+-----------+----------+--------------+ Gastroc  Full                                                        +---------+---------------+---------+-----------+----------+--------------+    Summary: RIGHT: - No evidence of common femoral vein obstruction.  LEFT: - There is no evidence of deep vein thrombosis in the lower extremity.  - No cystic structure found in the popliteal fossa.  *See table(s) above for measurements and observations. Electronically signed by  [name] Martinez MD on 11/04/2021 at 4:19:13 PM.    Final     Recent Labs    11/05/21 0549  WBC 4.8  HGB 10.0*  HCT 30.6*  PLT 224   Recent Labs    11/04/21 0739  NA 137  K 3.6  CL 106  CO2 24  GLUCOSE 96  BUN 16  CREATININE 0.47*  CALCIUM 8.6*      Intake/Output Summary (Last 24 hours) at 11/06/2021 0817 Last data filed at 11/06/2021 0723 Gross per 24 hour  Intake 829 ml  Output 1750 ml  Net -921 ml        Physical Exam: Vital Signs Blood pressure (!) 141/71, pulse 62, temperature 97.7 F (36.5 C), resp. rate 16, height _0  (1.753 m), weight 99.9 kg, SpO2 96 %.   General: awake, alert, appropriate, sitting up in bed- NT in room; just finished tray 100%; NAD HENT: conjugate gaze; oropharynx moist CV: regular rate; no JVD Pulmonary: CTA B/L; no W/R/R- good air movement but a mildly coarse cough noted-  GI: soft, NT, ND, (+)BS Psychiatric:  appropriate Neurological: Ox3 Genitourinary: Golden urine in bag, sides of catheter tubing stained red  Musculoskeletal: L shoulder K tape in place- shoulder still subluxed ~1"    Cervical back: Neck supple.   Moving all 4 extremities today; prior exams:  RUE- biceps 5/5; WE 4-/5; Triceps 2+/5; Grip 3-/5; FA 2-/5 LUE- biceps 5-/5; WE 4/5; Triceps 2+/5' Grip 3+/5; FA 2-/5 RLE- 1to 1+/5 HE, KE and trace at anke LLE- HF 1+/5; KE -/5; DF 1/5; PF 2/5; EHL 1/5  Decreased to light touch from L1 inferiorly; note sacrals not tested RLE spasms decreased; none in LLE  Assessment/Plan: 1. Functional deficits which require 3+ hours per day of interdisciplinary therapy in a comprehensive inpatient rehab setting. Physiatrist is providing close team supervision and 24 hour management of active medical problems listed below. Physiatrist and rehab team continue to assess barriers to discharge/monitor patient progress toward functional and medical goals  Care Tool:  Bathing    Body parts bathed by patient: Face   Body parts bathed  by helper: Right arm, Left arm, Chest, Abdomen, Front perineal area, Buttocks, Left lower leg, Right lower leg, Left upper leg, Right upper leg     Bathing assist Assist Level: Dependent - Patient 0%     Upper Body Dressing/Undressing Upper body dressing   What is the patient wearing?: Pull over shirt    Upper body assist Assist Level: Minimal Assistance - Patient > 75%    Lower Body Dressing/Undressing Lower body dressing      What is the patient wearing?: Pants     Lower body assist Assist for lower body dressing: Total Assistance - Patient < 25%     Toileting Toileting    Toileting assist Assist for toileting: Dependent - Patient 0%     Transfers Chair/bed transfer  Transfers assist  Chair/bed transfer activity did not occur: Safety/medical concerns  Chair/bed transfer assist level: 2 Helpers     Locomotion Ambulation   Ambulation assist   Ambulation activity did not occur: Safety/medical concerns          Walk 10 feet activity   Assist  Walk 10 feet activity did not occur: Safety/medical concerns        Walk 50 feet activity   Assist Walk 50 feet with 2 turns activity did not occur: Safety/medical concerns         Walk 150 feet activity   Assist Walk 150 feet activity did not occur: Safety/medical concerns         Walk 10 feet on uneven surface  activity   Assist Walk 10 feet on uneven surfaces activity did not occur: Safety/medical concerns         Wheelchair     Assist Is the patient using a wheelchair?: Yes Type of Wheelchair: Power Wheelchair activity did not occur: Safety/medical concerns  Wheelchair assist level: Supervision/Verbal cueing Max wheelchair distance: >300    Wheelchair 50 feet with 2 turns activity    Assist    Wheelchair 50 feet with 2 turns activity did not occur: Safety/medical concerns   Assist Level: Supervision/Verbal cueing   Wheelchair 150 feet activity     Assist   Wheelchair 150 feet activity did not occur: Safety/medical concerns   Assist Level: Supervision/Verbal cueing   Blood pressure (!) 141/71, pulse 62, temperature 97.7 F (36.5 C), resp. rate 16, height _0  (1.753 m), weight 99.9 kg, SpO2 96 %.  Medical Problem List and Plan: 1. 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  Antithrombotics: -DVT/anticoagulation:  Pharmaceutical: Lovenox.   9/13- spoke with Oncology- agree with me that needs Eliquis for a total of 3 months, even though DVT is gone, is at very high risk due to CA and new SCI.              -antiplatelet therapy: ASA 3. Pain: continue Oxycodone prn. 8/28- pt reports no significant pain- and doesn't want spasticity meds at this time. 9/12- pain controlled- con't regimen prn   4. Mood/Behavior/Sleep: LCSW to follow for evaluation and support.              -antipsychotic agents: N/A 5. Neuropsych/cognition: This patient is capable of making decisions on his own behalf. 6. Skin/Wound Care: Routine pressure relief measures              --monitor incision for healing.  7. Fluids/Electrolytes/Nutrition: Monitor I/O. Check CMET in am.  8. Prostate AdenoCA w/mets: Loading dose Firmagon 240 mg 10/06/21 per Dr. Tasia Catchings --Charlesetta Garibaldi changed to [facility] due to insurance coverage--next dose around 09/13. --Simulation for XRT 08/31 at 2 pm at [facility] 9/6- has appt 10/11 for Eliguard and MD f/u.  9/13- actually XRT will stop 9/20 per Oncology.  9. Dysphagia: pt now on regular/thins 10. Neurogenic bladder: continue foley for now  8/31- pt doesn't want to remove to  see if can void; or to do in/out caths- thinks too much stress on family.   9/3- still has some brownish  urine- see  through. 9/7 urine generally clear. May have some periodic blood d/t anticoagulation 9/13- will check with nursing to see when Catheter/Foley needs to be changed.  11. Neurogenic bowel: Will get KUB to determine stool burden --continue Colace in am with suppository after supper.  8/25- had good results with bowel program 8/27- good results with bowel program yesterday 8/28- having bowel accidents- and BM with bowel program last 3 nights-  8/29- 2 small BM's last night after bowel program- con't regimen 9/2-9/3 Having results with bowel program nightly  9/5- Didn't have results last night- but had before- will con't to monitor 9/8 pt had large, timely result with fleet enema last night. Continue thru weekend and perhaps convert back to suppository Monday.  9/11 Had large BM with enema, will continue tonight, consider restart suppository tomorrow 9/12- will change back to dulcolax supp as of tonight- still need dig stim- restarted bowel program  9/13- still having bowel accidents, but less frequent 12. Sepsis/Likely aspiration PNA: Now on Augmentin--antibiotic D#5/10. BC pend-->neg so far.              --Follow up CXR in am.              --pulmonary hygiene for atelectasis/Asymmetric volume loss on right.  13. Abnormal LFTs: Improved overall except for elevation in Aphos--315. Will recheck in am  8/25- Alk phos up to 457; AST 62 and ALT 96- except for alk phos, they are heading downwards- con't to monitor weekly.  14. ABLA: improving, monitor weekly   15. Leucocytosis: Recheck in am. Monitor for fevers/other signs of infection.    8/25- WBC 17.1k-  on Augmentin- will recheck labs in AM  -WBC improved to 15.6 10/19/21  8/28- Down to 10.8k- con't to monitor  8/31- WBC 9.5k- doing better            9/3 - 7.9--->4.6 on 9/9, improved 16. Hypotension: continue midodrine.   9/11 BP has been stable, denies symptoms, continue midodrine  17. DVT left gastrocnemius vein  -8/26 Noted Lovenox was  increased 8/25 to 126m BID, Distal DVT, will recheck in 1-2 weeks, compression stockings,   9/6- needs to go home on Tx lovenox- will need a minimum of 3 months due to DVT- cannot switch to Eliquis, etc since cancer related DVTs need Lovenox!  9/12- New Dopplers show no DVT- will d/w Oncology the plan since no DVT now, HOWEVER did have one and Cancer dramatically increases his risk.   9/13- put on Eliquis per Oncology for a minimum of 3 months  18. Spasticity  8/28- pt doesn't want spasticity meds- isn't painful or annoying.   9/6- Spasticity somewhat better today 19. Dispo  8/28- will d/c staples and [facility] 8/[facility]- need to see about [facility]-supposed to be given 9/12- Oncology to handle it- L/M/via PA about this.  8/30- per pt, they aren't using Firmagen- are using other product- will d/w Oncology more 9/2- XRT starts  9/12- 9/26   - will likely need power w/c with ROHO cushion- will add details as we know them  20. Left hand PIP digit swelling: placed OT order for kinesiotaping and nursing order for ice application-improved 21. Post nasal drip  9/13- will add Flonase daily for cough related to nasal drip-  I spent a total of  38  minutes on total care today- >50% coordination of care- due to d/w pt about family conference as well as nursing about foley    LOS: 20 days A FACE TO FACE EVALUATION WAS PERFORMED  Hanish Laraia 11/06/2021, 8:17 AM

## 2021-11-06 NOTE — Progress Notes (Signed)
Occupational Therapy Session Note  Patient Details  Name: Jason [name] MRN: [id number] Date of Birth: May 03, 1955  Today's Date: 11/06/2021 OT Individual Time: 1300-1330 OT Individual Time Calculation (min): 30 min    Short Term Goals: Week 3:  OT Short Term Goal 1 (Week 3): Pt will perform sitting EOB/EOM with min A for RUE simple functional or reaching task OT Short Term Goal 2 (Week 3): Pt will doff/don pull over shirt with min A at bed level with HOB elevated OT Short Term Goal 3 (Week 3): Pt will thread BLE into pants with max A seated in half circle sitting at bed level  Skilled Therapeutic Interventions/Progress Updates:    Pt resting in [place] upon arrival. [employer] training. Educated wife and pt on operation of lift. Educated wife on placement of sling and positioning. Did not complete transfer 2/2 time constraints and inappropriate sling available. SB transfer to bed with max A+2. Will continue training in future sessions. Pt remained in bed awaiting [facility] transport.  Therapy Documentation Precautions:  Precautions Precautions: Fall, Cervical Precaution Comments: C6 laminectomy, MAP >65 Restrictions Weight Bearing Restrictions: Yes RUE Weight Bearing: Weight bearing as tolerated Pain:  Pt denies pain this afternoon  Therapy/Group: Individual Therapy  Leroy Libman 11/06/2021, 2:11 PM

## 2021-11-06 NOTE — Progress Notes (Signed)
Bowel program performed this shift. Patient has not had a bowel movement yet. Notified off going nurse.   Yehuda Mao, LPN

## 2021-11-07 ENCOUNTER — Other Ambulatory Visit: Payer: Self-pay

## 2021-11-07 ENCOUNTER — Ambulatory Visit
Admit: 2021-11-07 | Discharge: 2021-11-07 | Disposition: A | Discharge status: Home / Self Care | Payer: BC Managed Care – PPO | Attending: Radiation Oncology | Admitting: Radiation Oncology

## 2021-11-07 ENCOUNTER — Ambulatory Visit: Payer: BC Managed Care – PPO

## 2021-11-07 ENCOUNTER — Encounter (HOSPITAL_COMMUNITY): Payer: Self-pay | Admitting: Physical Medicine and Rehabilitation

## 2021-11-07 DIAGNOSIS — Z79899 Other long term (current) drug therapy: Secondary | ICD-10-CM | POA: Diagnosis not present

## 2021-11-07 DIAGNOSIS — C7931 Secondary malignant neoplasm of brain: Secondary | ICD-10-CM | POA: Diagnosis not present

## 2021-11-07 DIAGNOSIS — I10 Essential (primary) hypertension: Secondary | ICD-10-CM | POA: Diagnosis not present

## 2021-11-07 DIAGNOSIS — C7951 Secondary malignant neoplasm of bone: Secondary | ICD-10-CM | POA: Diagnosis not present

## 2021-11-07 DIAGNOSIS — Z510 Encounter for antineoplastic radiation therapy: Secondary | ICD-10-CM | POA: Diagnosis not present

## 2021-11-07 DIAGNOSIS — C61 Malignant neoplasm of prostate: Secondary | ICD-10-CM | POA: Diagnosis not present

## 2021-11-07 DIAGNOSIS — C490 Malignant neoplasm of connective and soft tissue of head, face and neck: Secondary | ICD-10-CM | POA: Diagnosis not present

## 2021-11-07 DIAGNOSIS — E785 Hyperlipidemia, unspecified: Secondary | ICD-10-CM | POA: Diagnosis not present

## 2021-11-07 LAB — BPAM RBC
Blood Product Expiration Date: 202309132359
ISSUE DATE / TIME: 202308201138
Unit Type and Rh: 6200

## 2021-11-07 LAB — TYPE AND SCREEN
ABO/RH(D): A POS
Antibody Screen: NEGATIVE
Unit division: 0

## 2021-11-07 LAB — RAD ONC ARIA SESSION SUMMARY
Course Elapsed Days: 7
Plan Fractions Treated to Date: 6
Plan Prescribed Dose Per Fraction: 3 Gy
Plan Total Fractions Prescribed: 10
Plan Total Prescribed Dose: 30 Gy
Reference Point Dosage Given to Date: 18 Gy
Reference Point Session Dosage Given: 3 Gy
Session Number: 6

## 2021-11-07 MED ORDER — LEUPROLIDE ACETATE (3 MONTH) 22.5 MG ~~LOC~~ KIT
22.5000 mg | PACK | Freq: Once | SUBCUTANEOUS | Status: AC
  Filled 2021-11-07: qty 22.5

## 2021-11-07 MED ORDER — INFLUENZA VAC SPLIT QUAD 0.5 ML IM SUSY
0.5000 mL | PREFILLED_SYRINGE | INTRAMUSCULAR | Status: AC
  Administered 2021-11-08: 0.5 mL via INTRAMUSCULAR
  Filled 2021-11-07: qty 0.5

## 2021-11-07 NOTE — Telephone Encounter (Signed)
Per Dr. Tasia Catchings, please cancel injection on 10/11. Keep lab/ MD as scheduled.

## 2021-11-07 NOTE — Progress Notes (Signed)
Patient ID: Jason [name], male   DOB: Jul 05, 1955, 66 y.o.   MRN: [id number]  SW met with pt and pt wife in room to see how well family meeting went. Indicated it went well and was a lot of information. SW discussed ambulance transport home pending car transfer practice tomorrow. She reports there are disability forms that must be completed post discharge. SW shared these forms will be sent to outpatient rehab clinic for completion. No other questions/concerns reported.    Loralee Pacas, MSW, [place] Office: [phone] Cell: [phone] Fax: [phone]

## 2021-11-07 NOTE — Progress Notes (Signed)
Patient transported to [facility] [place] for RT.   Yehuda Mao, LPN

## 2021-11-07 NOTE — Progress Notes (Signed)
Physical Therapy Session Note  Patient Details  Name: [name] MRN: [id number] Date of Birth: 1955/09/10  Today's Date: 11/07/2021 PT Individual Time: 1045-1130 PT Individual Time Calculation (min): 45 min   Short Term Goals: Week 3:  PT Short Term Goal 1 (Week 3): Pt will perform SBT with max A x 1 consistently PT Short Term Goal 2 (Week 3): Pt will perform bed mobility max A x 1 PT Short Term Goal 3 (Week 3): Pt and wife will initiate hoyer lift training   Skilled Therapeutic Interventions/Progress Updates:   Session focused on on NMR to address functional transfers and bed mobility. Pt up in power w/c and focused on pt directing set up of transfer (questioning cues needed for sequencing of proper order). Performed max +2 transfer via slideboard with focus on anterior weightshift, trunk control and cues for functional use of RUE and "push through legs". Pt able to initiate anterior and lateral leans with min to mod assist needed.   Focused on NMR for bed mobility retraining on soft bed to prepare for d/c home and practicing sit > sidelying > supine and rolling both coming out of bed on the L and the R. Pt and wife report the room is set up to go out on the L side of bed, but discussed option for going out on the R for increased ability to use the R side (stronger due to h/o L shoulder sx). Mod to max assist for functional sitting balance EOB with pt also noted increased difficulty due to soft/compiant surface compared to the hard mat he has been practicing on. Focused on use of bed rails functionally for support during rolling, momentum, hooking, and activation through LLE as able to help with repositioning and rolling. Education and cues provided to improve technique. Requires max assist overall. Does demonstrate improved ability to tolerate propped on R elbow EOB and worked on pushing up into seated position. End of session repositioned in supine with focus on pt directing how to  reposition and all needs in reach.   Recommend continued practice to the R for increased independence and further bed mobility retraining.   Therapy Documentation Precautions:  Precautions Precautions: Fall, Cervical Precaution Comments: C6 laminectomy, MAP >65 Restrictions Weight Bearing Restrictions: Yes RUE Weight Bearing: Weight bearing as tolerated    Pain: Premedicated. Discomfort in L shoulder - ongoing.     Therapy/Group: Individual Therapy  Canary Brim Ivory Broad, PT, DPT, CBIS  11/07/2021, 12:01 PM

## 2021-11-07 NOTE — Progress Notes (Signed)
Occupational Therapy Session Note  Patient Details  Name: Jason [name] MRN: [id number] Date of Birth: 1955/12/19  Today's Date: 11/07/2021 OT Co-Treatment Time: 1030-1045 OT Co-Treatment Time Calculation (min): 15 min   Short Term Goals: Week 3:  OT Short Term Goal 1 (Week 3): Pt will perform sitting EOB/EOM with min A for RUE simple functional or reaching task OT Short Term Goal 2 (Week 3): Pt will doff/don pull over shirt with min A at bed level with HOB elevated OT Short Term Goal 3 (Week 3): Pt will thread BLE into pants with max A seated in half circle sitting at bed level  Skilled Therapeutic Interventions/Progress Updates:    Co-tx with PT for family conference with pt, wife, RN, Case Mgt, and MD. Discussed equipment needs and therapy plans going forward. All questions answered. Pt and wife pleased with progress.  Therapy Documentation Precautions:  Precautions Precautions: Fall, Cervical Precaution Comments: C6 laminectomy, MAP >65 Restrictions Weight Bearing Restrictions: Yes RUE Weight Bearing: Weight bearing as tolerated   Pain: Pain Assessment Pain Scale: 0-10 Pain Score: 0-No pain   Therapy/Group: Individual Therapy  Leroy Libman 11/07/2021, 11:47 AM

## 2021-11-07 NOTE — Progress Notes (Signed)
Occupational Therapy Session Note  Patient Details  Name: Jason [name] MRN: [id number] Date of Birth: February 07, 1956  Today's Date: 11/07/2021 OT Individual Time: 0815-0900 OT Individual Time Calculation (min): 45 min    Short Term Goals: Week 3:  OT Short Term Goal 1 (Week 3): Pt will perform sitting EOB/EOM with min A for RUE simple functional or reaching task OT Short Term Goal 2 (Week 3): Pt will doff/don pull over shirt with min A at bed level with HOB elevated OT Short Term Goal 3 (Week 3): Pt will thread BLE into pants with max A seated in half circle sitting at bed level  Skilled Therapeutic Interventions/Progress Updates:    Pt resting in bed with wife present. OT intervention with focus on UB/LB dressing tasks at bed level. Pt required assistance to move into unsupported sitting but able to maintain with mod A. Pt able to doff pullover shirt without assistance for task but mod A for sitting balance. Pt provided dressing stick to assist with pulling shirt over Lt shoulder. Pt completed successfully. Pt required assistance pulling over trunk completely. Pt assistted with pulling pants up to hips after BLE threaded. Rolling in bed with mod A+1 to facilitate care giver pulling pants over hips. Ongoing education. All questions answered. Pt remained in bed with all needs withing reach. Wife present.   Therapy Documentation Precautions:  Precautions Precautions: Fall, Cervical Precaution Comments: C6 laminectomy, MAP >65 Restrictions Weight Bearing Restrictions: Yes RUE Weight Bearing: Weight bearing as tolerated   Pain: Pain Assessment Pain Scale: 0-10 Pain Score: 0-No pain   Therapy/Group: Individual Therapy  Leroy Libman 11/07/2021, 11:44 AM

## 2021-11-07 NOTE — Progress Notes (Signed)
PROGRESS NOTE   Subjective/Complaints:  Coughing better with flonase Asking about flu shot- will schedule tomorrow.   ROS:    Pt denies SOB, abd pain, CP, N/V/C/D, and vision changes    Objective:   No results found.  Recent Labs    11/05/21 0549  WBC 4.8  HGB 10.0*  HCT 30.6*  PLT 224   No results for input(s): "NA", "K", "CL", "CO2", "GLUCOSE", "BUN", "CREATININE", "CALCIUM" in the last 72 hours.     Intake/Output Summary (Last 24 hours) at 11/07/2021 2030 Last data filed at 11/07/2021 1331 Gross per 24 hour  Intake 472 ml  Output 1250 ml  Net -778 ml        Physical Exam: Vital Signs Blood pressure (!) 147/69, pulse 64, temperature 98.2 F (36.8 C), temperature source Oral, resp. rate 15, height _0  (1.753 m), weight 99.9 kg, SpO2 96 %.    General: awake, alert, appropriate,  sitting up in w/c with wife at bedside; NAD HENT: conjugate gaze; oropharynx moist CV: regular rate; no JVD Pulmonary: CTA B/L; no W/R/R- good air movement- no coughing this AM GI: soft, NT, ND, (+)BS Psychiatric: appropriate Neurological: Ox3 Genitourinary: Golden urine in bag, sides of catheter tubing stained red  Musculoskeletal: L shoulder K tape in place- shoulder still subluxed ~1"    Cervical back: Neck supple.   Moving all 4 extremities today; prior exams:  RUE- biceps 5/5; WE 4-/5; Triceps 2+/5; Grip 3-/5; FA 2-/5 LUE- biceps 5-/5; WE 4/5; Triceps 2+/5' Grip 3+/5; FA 2-/5 RLE- 1to 1+/5 HE, KE and trace at anke LLE- HF 1+/5; KE -/5; DF 1/5; PF 2/5; EHL 1/5  Decreased to light touch from L1 inferiorly; note sacrals not tested RLE spasms decreased; none in LLE  Assessment/Plan: 1. Functional deficits which require 3+ hours per day of interdisciplinary therapy in a comprehensive inpatient rehab setting. Physiatrist is providing close team supervision and 24 hour management of active medical problems listed  below. Physiatrist and rehab team continue to assess barriers to discharge/monitor patient progress toward functional and medical goals  Care Tool:  Bathing    Body parts bathed by patient: Face   Body parts bathed by helper: Right arm, Left arm, Chest, Abdomen, Front perineal area, Buttocks, Left lower leg, Right lower leg, Left upper leg, Right upper leg     Bathing assist Assist Level: Dependent - Patient 0%     Upper Body Dressing/Undressing Upper body dressing   What is the patient wearing?: Pull over shirt    Upper body assist Assist Level: Minimal Assistance - Patient > 75%    Lower Body Dressing/Undressing Lower body dressing      What is the patient wearing?: Pants     Lower body assist Assist for lower body dressing: Total Assistance - Patient < 25%     Toileting Toileting    Toileting assist Assist for toileting: Dependent - Patient 0%     Transfers Chair/bed transfer  Transfers assist  Chair/bed transfer activity did not occur: Safety/medical concerns  Chair/bed transfer assist level: 2 Helpers     Locomotion Ambulation   Ambulation assist   Ambulation activity did not occur: Safety/medical  concerns          Walk 10 feet activity   Assist  Walk 10 feet activity did not occur: Safety/medical concerns        Walk 50 feet activity   Assist Walk 50 feet with 2 turns activity did not occur: Safety/medical concerns         Walk 150 feet activity   Assist Walk 150 feet activity did not occur: Safety/medical concerns         Walk 10 feet on uneven surface  activity   Assist Walk 10 feet on uneven surfaces activity did not occur: Safety/medical concerns         Wheelchair     Assist Is the patient using a wheelchair?: Yes Type of Wheelchair: Power Wheelchair activity did not occur: Safety/medical concerns  Wheelchair assist level: Supervision/Verbal cueing Max wheelchair distance: >300    Wheelchair 50 feet  with 2 turns activity    Assist    Wheelchair 50 feet with 2 turns activity did not occur: Safety/medical concerns   Assist Level: Supervision/Verbal cueing   Wheelchair 150 feet activity     Assist  Wheelchair 150 feet activity did not occur: Safety/medical concerns   Assist Level: Supervision/Verbal cueing   Blood pressure (!) 147/69, pulse 64, temperature 98.2 F (36.8 C), temperature source Oral, resp. rate 15, height _0  (1.753 m), weight 99.9 kg, SpO2 96 %.  Medical Problem List and Plan: 1. 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  2.  Antithrombotics: -DVT/anticoagulation:  Pharmaceutical: Lovenox.   9/13- spoke with Oncology- agree with me that needs Eliquis for a total of 3 months, even though DVT is gone, is at very high risk due to CA and new SCI.              -antiplatelet therapy: ASA 3. Pain: continue Oxycodone prn. 8/28- pt reports no significant pain- and doesn't want spasticity meds at this time. 9/12- pain controlled- con't regimen prn   4. Mood/Behavior/Sleep: LCSW to follow for evaluation and support.              -antipsychotic agents: N/A 5. Neuropsych/cognition: This patient is capable of making decisions on his own behalf. 6. Skin/Wound Care: Routine pressure relief measures              --monitor incision for healing.  7. Fluids/Electrolytes/Nutrition: Monitor I/O. Check CMET in am.  8. Prostate AdenoCA w/mets: Loading dose Firmagon 240 mg 10/06/21 per Dr. Tasia Catchings --Charlesetta Garibaldi changed to [facility] due to insurance coverage--next dose around  09/13. --Simulation for XRT 08/31 at 2 pm at [facility] 9/6- has appt 10/11 for Eliguard and MD f/u.  9/13- actually XRT will stop 9/20 per Oncology.  9. Dysphagia: pt now on regular/thins 10. Neurogenic bladder: continue foley for now  8/31- pt doesn't want to remove to  see if can void; or to do in/out caths- thinks too much stress on family.   9/3- still has some brownish  urine- see  through. 9/7 urine generally clear. May have some periodic blood d/t anticoagulation 9/13- will check with nursing to see when Catheter/Foley needs to be changed.  9/14- due to be change 9/18- will change foley 9/18- 11. Neurogenic bowel: Will get KUB to determine stool burden --continue Colace in am with suppository after supper.  8/25- had good results with bowel program 8/27- good results with bowel program yesterday 8/28- having bowel accidents- and BM with bowel program last 3 nights-  8/29- 2 small BM's last night after bowel program- con't regimen 9/2-9/3 Having results with bowel program nightly  9/5- Didn't have results last night- but had before- will con't to monitor 9/8 pt had large, timely result with fleet enema last night. Continue thru weekend and perhaps convert back to suppository Monday.  9/11 Had large BM with enema, will continue tonight, consider restart suppository tomorrow 9/12- will change back to dulcolax supp as of tonight- still need dig stim- restarted bowel program  9/13- still having bowel accidents, but less frequent  9/14- wife to learn bowel program this evening- educated that needs to do nightly to get him on regimen- takes ~ 6 weeks to train gut 12. Sepsis/Likely aspiration PNA: Now on Augmentin--antibiotic D#5/10. BC pend-->neg so far.              --Follow up CXR in am.              --pulmonary hygiene for atelectasis/Asymmetric volume loss on right.  13. Abnormal LFTs: Improved overall except for elevation in Aphos--315. Will recheck in am  8/25- Alk phos up to 457; AST 62  and ALT 96- except for alk phos, they are heading downwards- con't to monitor weekly.  14. ABLA: improving, monitor weekly   15. Leucocytosis: Recheck in am. Monitor for fevers/other signs of infection.    8/25- WBC 17.1k- on Augmentin- will recheck labs in AM  -WBC improved to 15.6 10/19/21  8/28- Down to 10.8k- con't to monitor  8/31- WBC 9.5k- doing better            9/3 - 7.9--->4.6 on 9/9, improved 16. Hypotension: continue midodrine.   9/11 BP has been stable, denies symptoms, continue midodrine  17. DVT left gastrocnemius vein  -8/26 Noted Lovenox was increased 8/25 to 193m BID, Distal DVT, will recheck in 1-2 weeks, compression stockings,   9/6- needs to go home on Tx lovenox- will need a minimum of 3 months due to DVT- cannot switch to Eliquis, etc since cancer related DVTs need Lovenox!  9/12- New Dopplers show no DVT- will d/w Oncology the plan since no DVT now, HOWEVER did have one and Cancer dramatically increases his risk.   9/13- put on Eliquis per Oncology for a minimum of 3 months  18. Spasticity  8/28- pt doesn't want spasticity meds- isn't painful or annoying.   9/6- Spasticity somewhat better today  9/14- educated can get worse for up to 2 years 19. Dispo  8/28- will d/c staples and see if PA can stop appointments at [facility] only at [facility] 8/29- need to see about FMills Koller-supposed to be given 9/12- Oncology to handle it- L/M/via PA about this.  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 Left hand PIP digit swelling: placed OT order for kinesiotaping and nursing order for ice application-improved 21. Post nasal drip  9/13- will add Flonase daily for cough related to nasal drip-  9/14- helping wishes had started "years ago".  22. Flu shot- will get tomorrow- ordered  I spent a total of 58   minutes on total care today- >50% coordination of care- due to family  conference- answering wife's and pt's questions and educating on spasticity, Neurogenic bowel and bladder as well as skin and disability- and speaking with SW about hormone and pharmacy for oncology injection- also   LOS: 21 days A FACE TO FACE EVALUATION WAS PERFORMED  Shandell Giovanni 11/07/2021, 8:30 PM

## 2021-11-07 NOTE — Progress Notes (Signed)
Wife performed foley care and bowel program this shift. Patient's bowel program was complete- see flowsheet. Notified off going nurse.  Also, wife was provided education on the following: skin integrity, hydration, foley care, and bowel program regimen. Notified off going nurse. Day shift nurse to continue education with Wife. Wife to continue education with nursing.     Yehuda Mao, LPN

## 2021-11-07 NOTE — Progress Notes (Signed)
Physical Therapy Session Note  Patient Details  Name: Jason [name] MRN: [id number] Date of Birth: [birth year]  Today's Date: 11/07/2021 PT Individual Time: 0900-1000 PT Individual Time Calculation (min): 60 min  and Today's Date: 11/07/2021 PT Co-Treatment Time: 1000-1030 PT Co-Treatment Time Calculation (min): 30 min  Short Term Goals: Week 3:  PT Short Term Goal 1 (Week 3): Pt will perform SBT with max A x 1 consistently PT Short Term Goal 2 (Week 3): Pt will perform bed mobility max A x 1 PT Short Term Goal 3 (Week 3): Pt and wife will initiate hoyer lift training  Skilled Therapeutic Interventions/Progress Updates:    Session 1: pt received in bed and agreeable to therapy. No complaint of pain. Session focused on initiating family education on use of hoyer lift. Pt's wife, Jason [name], participated in transfer with therapist providing cues and +2 assist only. Pt able to roll with set up and mod A, with assist to maintain while sling was placed. Extensive education provided while performing transfer, including placement of sling, use of hoyer, brakes, etc. Therapist provided +2 assist to position into chair. Then discussed boosting methods, and Jason [name] attempted x 2 with some difficulty and cues required. Jason [name] then placed sling as if to perform transfer out of chair with cueing and education throughout. Removed sling with cueing. At this time, Tx session included for family conference.  Session 2: Pt and wife participated in family conference with RN, Case manager, and MD. Pt remained seated in [place] throughout and reports no pain. Discussed equipment needs, and answered all family questions. Pt remained seated in w/c with his wife present at end of conference.   Therapy Documentation Precautions:  Precautions Precautions: Fall, Cervical Precaution Comments: C6 laminectomy, MAP >65 Restrictions Weight Bearing Restrictions: Yes RUE Weight Bearing: Weight bearing as tolerated General:       Therapy/Group: Individual Therapy and Co-Treatment  [name] 11/07/2021, 9:27 PM

## 2021-11-08 ENCOUNTER — Ambulatory Visit
Admit: 2021-11-08 | Discharge: 2021-11-08 | Disposition: A | Discharge status: Home / Self Care | Payer: BC Managed Care – PPO | Attending: Radiation Oncology | Admitting: Radiation Oncology

## 2021-11-08 ENCOUNTER — Inpatient Hospital Stay: Payer: BC Managed Care – PPO | Attending: Anatomic Pathology & Clinical Pathology

## 2021-11-08 ENCOUNTER — Other Ambulatory Visit: Payer: Self-pay

## 2021-11-08 ENCOUNTER — Other Ambulatory Visit (HOSPITAL_COMMUNITY): Payer: Self-pay

## 2021-11-08 DIAGNOSIS — E785 Hyperlipidemia, unspecified: Secondary | ICD-10-CM | POA: Diagnosis not present

## 2021-11-08 DIAGNOSIS — Z79899 Other long term (current) drug therapy: Secondary | ICD-10-CM | POA: Insufficient documentation

## 2021-11-08 DIAGNOSIS — I10 Essential (primary) hypertension: Secondary | ICD-10-CM | POA: Diagnosis not present

## 2021-11-08 DIAGNOSIS — Z510 Encounter for antineoplastic radiation therapy: Secondary | ICD-10-CM | POA: Diagnosis not present

## 2021-11-08 DIAGNOSIS — C61 Malignant neoplasm of prostate: Secondary | ICD-10-CM | POA: Diagnosis not present

## 2021-11-08 DIAGNOSIS — C7931 Secondary malignant neoplasm of brain: Secondary | ICD-10-CM | POA: Insufficient documentation

## 2021-11-08 DIAGNOSIS — C7951 Secondary malignant neoplasm of bone: Secondary | ICD-10-CM | POA: Diagnosis not present

## 2021-11-08 DIAGNOSIS — C490 Malignant neoplasm of connective and soft tissue of head, face and neck: Secondary | ICD-10-CM | POA: Diagnosis not present

## 2021-11-08 LAB — RAD ONC ARIA SESSION SUMMARY
Course Elapsed Days: 8
Plan Fractions Treated to Date: 7
Plan Prescribed Dose Per Fraction: 3 Gy
Plan Total Fractions Prescribed: 10
Plan Total Prescribed Dose: 30 Gy
Reference Point Dosage Given to Date: 21 Gy
Reference Point Session Dosage Given: 3 Gy
Session Number: 7

## 2021-11-08 LAB — CBC
HCT: 35.7 % — ABNORMAL LOW (ref 39.0–52.0)
Hemoglobin: 11.1 g/dL — ABNORMAL LOW (ref 13.0–17.0)
MCH: 31.2 pg (ref 26.0–34.0)
MCHC: 31.1 g/dL (ref 30.0–36.0)
MCV: 100.3 fL — ABNORMAL HIGH (ref 80.0–100.0)
Platelets: 244 10*3/uL (ref 150–400)
RBC: 3.56 MIL/uL — ABNORMAL LOW (ref 4.22–5.81)
RDW: 16.9 % — ABNORMAL HIGH (ref 11.5–15.5)
WBC: 4.4 10*3/uL (ref 4.0–10.5)
nRBC: 0 % (ref 0.0–0.2)

## 2021-11-08 MED ORDER — LUPRON DEPOT (3-MONTH) 22.5 MG IM KIT: PACK | INTRAMUSCULAR | 0 refills | Status: DC

## 2021-11-08 MED ORDER — LUPRON DEPOT (3-MONTH) 22.5 MG IM KIT
PACK | INTRAMUSCULAR | 0 refills | Status: DC
  Filled 2021-11-08: qty 1, 90d supply, fill #0

## 2021-11-08 MED ORDER — LEUPROLIDE ACETATE (3 MONTH) 22.5 MG ~~LOC~~ KIT
22.5000 mg | PACK | Freq: Once | SUBCUTANEOUS | Status: AC
  Administered 2021-11-08: 22.5 mg via SUBCUTANEOUS
  Filled 2021-11-08: qty 22.5

## 2021-11-08 NOTE — Plan of Care (Signed)
  Problem: Consults Goal: RH SPINAL CORD INJURY PATIENT EDUCATION Description:  See Patient Education module for education specifics.  Outcome: Progressing Goal: Skin Care Protocol Initiated - if Braden Score 18 or less Description: If consults are not indicated, leave blank or document N/A Outcome: Progressing   Problem: SCI BOWEL ELIMINATION Goal: RH STG MANAGE BOWEL WITH ASSISTANCE Description: STG Manage Bowel with mod Assistance. Outcome: Progressing   Problem: SCI BLADDER ELIMINATION Goal: RH STG MANAGE BLADDER WITH ASSISTANCE Description: STG Manage Bladder With min Assistance Outcome: Progressing Goal: RH STG SCI MANAGE BLADDER PROGRAM W/ASSISTANCE Outcome: Progressing   Problem: RH SKIN INTEGRITY Goal: RH STG SKIN FREE OF INFECTION/BREAKDOWN Description: Incisions and skin will be free of infection/breakdown with min assist Outcome: Progressing Goal: RH STG MAINTAIN SKIN INTEGRITY WITH ASSISTANCE Description: STG Maintain Skin Integrity With min Assistance. Outcome: Progressing Goal: RH STG ABLE TO PERFORM INCISION/WOUND CARE W/ASSISTANCE Description: STG Able To Perform Incision/Wound Care With min Assistance. Outcome: Progressing   Problem: RH SAFETY Goal: RH STG ADHERE TO SAFETY PRECAUTIONS W/ASSISTANCE/DEVICE Description: STG Adhere to Safety Precautions With min Assistance/Device. Outcome: Progressing   Problem: RH PAIN MANAGEMENT Goal: RH STG PAIN MANAGED AT OR BELOW PT'S PAIN GOAL Description: Pain will be managed at 3 out of 10 on pain scale Outcome: Progressing   Problem: RH KNOWLEDGE DEFICIT SCI Goal: RH STG INCREASE KNOWLEDGE OF SELF CARE AFTER SCI Description: Patient/caregiver will be able to verbalize and perform self care at w/c level from nursing education, handouts and therapies.  Outcome: Progressing   Problem: RH Pre-functional/Other (Specify) Goal: RH LTG Interdisciplinary (Specify) 1 Description: RH LTG Interdisciplinary  (Specify)1 Outcome: Progressing

## 2021-11-08 NOTE — Progress Notes (Signed)
During morning medication pass patient wife was present and informed nurse about flu shot and chemo shot that he is to take in the morning. Patient wife informed nurse that she thought chemo medication had a specific shelf life. Nurse consulted with pharmacy as to the requirements for the medication. [facility] informed nurse that a chemo nurse would be giving patient the injection. Nurse informed [place] that patient is to leave this afternoon for cancer treatment. [facility] consulted with oncology nurse and informed floor nurse that patient would be getting injection at [facility] long where he would be receiving cancer treatment. While patient was at oncology wife informed nurse that she received a call that there was some mix up with the payment and that the patient was not going to get the shot. Nurse consulted with [facility], Nurse and MD at [facility] to see what was going on. Patient wife stated that she was told that medication would be paid for. Charge nurse made aware, as were various staff members. Wife stated that she was called and was let known that the medication was paid for. Sanda Linger, RN

## 2021-11-08 NOTE — Progress Notes (Signed)
Oncology brief follow up note   [place] agreed to cover his Eligard, so pt received Eligard 22.'5mg'$  injection today when he was in our cancer center for radiation treatment. I communicated with [place], our pharmacy, [facility] outpt specify pharmacy and his primary oncologist Dr. Tasia Catchings today. I also called pt's wife and informed that the drug is covered, and pt does not need to pay this out of pocket. She very much appreciate our effort to take care of him. I also remind her to follow up with Dr. Tasia Catchings after his discharge. He has PMSA scan scheduled on 9/27, after his planned discharge on 9/26.   Truitt Merle MD  11/08/2021

## 2021-11-08 NOTE — Progress Notes (Signed)
Occupational Therapy Session Note  Patient Details  Name: JIYAN WALKOWSKI MRN: [id number] Date of Birth: 1955-12-27  Today's Date: 11/08/2021 OT Individual Time: 1638-4536 OT Individual Time Calculation (min): 75 min    Short Term Goals: Week 4:  OT Short Term Goal 1 (Week 4): Pt will perform sitting EOB/EOM with min A for RUE simple functional or reaching task OT Short Term Goal 2 (Week 4): Pt will thread BLE into pants with max A seated in half circle sitting at bed level OT Short Term Goal 3 (Week 4): Pt will perform SB transfer with max A+1  Skilled Therapeutic Interventions/Progress Updates:    Pt resting in bed upon arrival and ready to start the day. Pt reports that he was able to eat his breakfast without assistance after setup. OT intervention with focus on UB/LB dressing tasks at bed level. UB dressing with HOB elevated and with mod A for sitting balance when in long sitting to pull shirt over head. Pt completed 3.5/4 UB dressing tasks, using dressing stick to assist with pushing shirt over Lt shoulder. Pt pulled pants up from ankles (after threaded over BLE) to mid-thigh. Rolling in bed with mod A to R/L using bed rails to facilitate pulling pants over hips. Supine>sit EOB with max A on Rt side of bed. Sitting balance EOB with mod A in preparation for SB transfer to w/c. SB transfer with max A +2 for safety. Head rest adjusted on loaner PWC. Pt completed grooming tasks with setup. Ongoing education with wife. Pt remained in [place] with all needs within reach.   Therapy Documentation Precautions:  Precautions Precautions: Fall, Cervical Precaution Comments: C6 laminectomy, MAP >65 Restrictions Weight Bearing Restrictions: Yes RUE Weight Bearing: Weight bearing as tolerated   Pain:  Pt denies pain this morning   Therapy/Group: Individual Therapy  Leroy Libman 11/08/2021, 9:33 AM

## 2021-11-08 NOTE — Progress Notes (Signed)
Printed education on indwelling urinary catheter care and how to irrigate foley catheter

## 2021-11-08 NOTE — Progress Notes (Signed)
PROGRESS NOTE   Subjective/Complaints:  Bowel program went well with wife.  Doing car transfer today.  Otherwise no issues.   ROS:    Pt denies SOB, abd pain, CP, N/V/C/D, and vision changes     Objective:   No results found.  No results for input(s): "WBC", "HGB", "HCT", "PLT" in the last 72 hours.  No results for input(s): "NA", "K", "CL", "CO2", "GLUCOSE", "BUN", "CREATININE", "CALCIUM" in the last 72 hours.     Intake/Output Summary (Last 24 hours) at 11/08/2021 0846 Last data filed at 11/08/2021 0801 Gross per 24 hour  Intake 712 ml  Output 800 ml  Net -88 ml        Physical Exam: Vital Signs Blood pressure 119/63, pulse 68, temperature 98.2 F (36.8 C), resp. rate 14, height '5\' 9"'  (1.753 m), weight 99.9 kg, SpO2 96 %.     General: awake, alert, appropriate, sitting up in bed; NAD HENT: conjugate gaze; oropharynx moist CV: regular rate; no JVD Pulmonary: CTA B/L; no W/R/R- good air movement GI: soft, NT, ND, (+)BS Psychiatric: appropriate Neurological: Ox3 Genitourinary: Golden urine in bag, sides of catheter tubing stained red  Musculoskeletal: L shoulder K tape in place- shoulder still subluxed ~1"    Cervical back: Neck supple.   Moving all 4 extremities today; prior exams:  RUE- biceps 5/5; WE 4-/5; Triceps 2+/5; Grip 3-/5; FA 2-/5 LUE- biceps 5-/5; WE 4/5; Triceps 2+/5' Grip 3+/5; FA 2-/5 RLE- 1to 1+/5 HE, KE and trace at anke LLE- HF 1+/5; KE -/5; DF 1/5; PF 2/5; EHL 1/5  Decreased to light touch from L1 inferiorly; note sacrals not tested RLE spasms decreased; none in LLE  Assessment/Plan: 1. Functional deficits which require 3+ hours per day of interdisciplinary therapy in a comprehensive inpatient rehab setting. Physiatrist is providing close team supervision and 24 hour management of active medical problems listed below. Physiatrist and rehab team continue to assess barriers to  discharge/monitor patient progress toward functional and medical goals  Care Tool:  Bathing    Body parts bathed by patient: Face   Body parts bathed by helper: Right arm, Left arm, Chest, Abdomen, Front perineal area, Buttocks, Left lower leg, Right lower leg, Left upper leg, Right upper leg     Bathing assist Assist Level: Dependent - Patient 0%     Upper Body Dressing/Undressing Upper body dressing   What is the patient wearing?: Pull over shirt    Upper body assist Assist Level: Minimal Assistance - Patient > 75%    Lower Body Dressing/Undressing Lower body dressing      What is the patient wearing?: Pants     Lower body assist Assist for lower body dressing: Total Assistance - Patient < 25%     Toileting Toileting    Toileting assist Assist for toileting: Dependent - Patient 0%     Transfers Chair/bed transfer  Transfers assist  Chair/bed transfer activity did not occur: Safety/medical concerns  Chair/bed transfer assist level: 2 Helpers     Locomotion Ambulation   Ambulation assist   Ambulation activity did not occur: Safety/medical concerns          Walk 10 feet activity  Assist  Walk 10 feet activity did not occur: Safety/medical concerns        Walk 50 feet activity   Assist Walk 50 feet with 2 turns activity did not occur: Safety/medical concerns         Walk 150 feet activity   Assist Walk 150 feet activity did not occur: Safety/medical concerns         Walk 10 feet on uneven surface  activity   Assist Walk 10 feet on uneven surfaces activity did not occur: Safety/medical concerns         Wheelchair     Assist Is the patient using a wheelchair?: Yes Type of Wheelchair: Power Wheelchair activity did not occur: Safety/medical concerns  Wheelchair assist level: Supervision/Verbal cueing Max wheelchair distance: >300    Wheelchair 50 feet with 2 turns activity    Assist    Wheelchair 50 feet with  2 turns activity did not occur: Safety/medical concerns   Assist Level: Supervision/Verbal cueing   Wheelchair 150 feet activity     Assist  Wheelchair 150 feet activity did not occur: Safety/medical concerns   Assist Level: Supervision/Verbal cueing   Blood pressure 119/63, pulse 68, temperature 98.2 F (36.8 C), resp. rate 14, height '5\' 9"'  (1.753 m), weight 99.9 kg, SpO2 96 %.  Medical Problem List and Plan: 1. 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  Antithrombotics: -DVT/anticoagulation:  Pharmaceutical: Lovenox.   9/13- spoke with Oncology- agree with me that needs Eliquis for a total of 3 months, even though DVT is gone, is at very high risk due to CA and new SCI.              -antiplatelet therapy: ASA 3. Pain: continue Oxycodone prn. 8/28- pt reports no significant pain- and doesn't want spasticity meds at this time. 9/15- pain controlled- con't regimen prn  4. Mood/Behavior/Sleep: LCSW to follow for evaluation and support.              -antipsychotic agents: N/A 5. Neuropsych/cognition: This patient is capable of making decisions on his own behalf. 6. Skin/Wound Care: Routine pressure relief measures              --monitor incision for healing.  7. Fluids/Electrolytes/Nutrition: Monitor I/O. Check CMET in am.  8. Prostate AdenoCA w/mets: Loading dose Firmagon 240 mg 10/06/21 per Dr. Tasia Catchings --Charlesetta Garibaldi changed to [facility] due to insurance coverage--next dose around 09/13. --Simulation for XRT 08/31 at 2 pm at [facility] 9/6- has appt 10/11 for Eliguard and MD f/u.  9/13- actually XRT will stop 9/20 per Oncology.  9. Dysphagia: pt now on regular/thins 10. Neurogenic bladder: continue foley for now  8/31- pt doesn't want to  remove to  see if can void; or to do in/out caths- thinks too much stress on family.   9/3- still has some brownish  urine- see through. 9/7 urine generally clear. May have some periodic blood d/t anticoagulation 9/13- will check with nursing to see when Catheter/Foley needs to be changed.  9/14- due to be change 9/18- will change foley 9/18- 11. Neurogenic bowel: Will get KUB to determine stool burden --  continue Colace in am with suppository after supper.  8/25- had good results with bowel program 8/27- good results with bowel program yesterday 8/28- having bowel accidents- and BM with bowel program last 3 nights-  8/29- 2 small BM's last night after bowel program- con't regimen 9/2-9/3 Having results with bowel program nightly  9/5- Didn't have results last night- but had before- will con't to monitor 9/8 pt had large, timely result with fleet enema last night. Continue thru weekend and perhaps convert back to suppository Monday.  9/11 Had large BM with enema, will continue tonight, consider restart suppository tomorrow 9/12- will change back to dulcolax supp as of tonight- still need dig stim- restarted bowel program  9/13- still having bowel accidents, but less frequent  9/14- wife to learn bowel program this evening- educated that needs to do nightly to get him on regimen- takes ~ 6 weeks to train gut 12. Sepsis/Likely aspiration PNA: Now on Augmentin--antibiotic D#5/10. BC pend-->neg so far.              --Follow up CXR in am.              --pulmonary hygiene for atelectasis/Asymmetric volume loss on right.  13. Abnormal LFTs: Improved overall except for elevation in Aphos--315. Will recheck in am  8/25- Alk phos up to 457; AST 62 and ALT 96- except for alk phos, they are heading downwards- con't to monitor weekly.  14. ABLA: improving, monitor weekly   15. Leucocytosis: Recheck in am. Monitor for fevers/other signs of infection.    8/25- WBC 17.1k- on Augmentin- will recheck labs in  AM  -WBC improved to 15.6 10/19/21  8/28- Down to 10.8k- con't to monitor  8/31- WBC 9.5k- doing better            9/3 - 7.9--->4.6 on 9/9, improved 16. Hypotension: continue midodrine.   9/11 BP has been stable, denies symptoms, continue midodrine  17. DVT left gastrocnemius vein  -8/26 Noted Lovenox was increased 8/25 to 168m BID, Distal DVT, will recheck in 1-2 weeks, compression stockings,   9/6- needs to go home on Tx lovenox- will need a minimum of 3 months due to DVT- cannot switch to Eliquis, etc since cancer related DVTs need Lovenox!  9/12- New Dopplers show no DVT- will d/w Oncology the plan since no DVT now, HOWEVER did have one and Cancer dramatically increases his risk.   9/13- put on Eliquis per Oncology for a minimum of 3 months  18. Spasticity  8/28- pt doesn't want spasticity meds- isn't painful or annoying.   9/6- Spasticity somewhat better today  9/14- educated can get worse for up to 2 years 19. Dispo  8/28- will d/c staples and [facility] 8[facility]- need to see about [facility]-supposed to be given 9/12- Oncology to handle it- L/M/via PA about this.  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 Left hand PIP digit swelling: placed OT order for kinesiotaping and nursing order for ice application-improved 21. Post nasal drip  9/13- will add Flonase daily for cough related to nasal  drip-  9/14- helping wishes had started "years ago".  22. Flu shot- will get tomorrow- ordered  9/15- to get this AM    LOS: 22 days A FACE TO FACE EVALUATION WAS PERFORMED  Bear Osten 11/08/2021, 8:46 AM

## 2021-11-08 NOTE — Progress Notes (Signed)
**Note Jason-Identified via Obfuscation** Physical Therapy Session Note  Patient Details  Name: DEMORRIS [name] MRN: [id number] Date of Birth: Aug 22, 1955  Today's Date: 11/08/2021 PT Individual Time: 1030-1200 PT Individual Time Calculation (min): 90 min   Short Term Goals: Week 3:  PT Short Term Goal 1 (Week 3): Pt will perform SBT with max A x 1 consistently PT Short Term Goal 2 (Week 3): Pt will perform bed mobility max A x 1 PT Short Term Goal 3 (Week 3): Pt and wife will initiate hoyer lift training  Skilled Therapeutic Interventions/Progress Updates:    Session 1: pt received in bed and agreeable to therapy. No complaint of pain. Session focused on car transfer and family education. Pt navigated PWC through hospital and in outdoor environment with cues and supervision for navigation. Pt then participated in problem solving with his wife and therapists for performing car transfer. Pt was unable to perform d/t inadequate space for a caregiver to stand in front to assist. Pt already has transport lined up for first few follow up visits. Discussed need for ambulance transfer home. Pt then returned to unit and performed slideboard transferi with max A +1 and then returned to w/c with max A from pt's wife and min A from therapist. Cueing for transfer technique and body mechanics. Then discussed and demonstrated boosting technique. Pt returned to room and remained in chair with his wife present.   Session 2:  Pt received in [place], agreeable to therapy. No complaint of pain. Pt set up transfer with [place] with supervision. Pt's wife performed slideboard transfer >bed with CGA from therapist and cues for body mechanics. Pt's wife then instructed on sit>supine with tot +2, with Jason [name] managing BLE and therapist managing trunk. Demonstrated good body mechanics, which Jason [name] was able to achieve for return demonstration. Assisted with pulling pt up in bed. Pt remained in bed with his wife present to await transfer for cancer treatment.   Therapy  Documentation Precautions:  Precautions Precautions: Fall, Cervical Precaution Comments: C6 laminectomy, MAP >65 Restrictions Weight Bearing Restrictions: Yes RUE Weight Bearing: Weight bearing as tolerated General:       Therapy/Group: Individual Therapy  [name] 11/08/2021, 12:17 PM

## 2021-11-08 NOTE — Progress Notes (Signed)
Occupational Therapy Weekly Progress Note  Patient Details  Name: Jason [name] MRN: [id number] Date of Birth: [birth year]  Beginning of progress report period: October 31, 2021 End of progress report period: November 08, 2021    Patient has met 1 of 3 short term goals.  Pt has made slow but steady progress with sitting balance, dressing at bed level, SB transfers, and bed mobility over the past week. Pt can maintain static sitting balance with min A/close supervision for ~60 secs seated EOM. Pt continues to require mod A/max A for dynamic sitting balance during reaching /functional UE tasks. Pt completes UB dressing tasks with min A and HOB elevated. Pt requires tot A for LB dressing tasks but is able to pull pants up to hips once threaded. Pt dependent for pulling pants over hips. Pt uses compensatory techniques for rolling in bed and rolls to his left with mod A and to his right with max A. Supine>sit EOB with max A. SB transfers with max A+2. Pt requires min verbal cues for directing care. Pt's wife has been present and participates in therapy sessions.   Patient continues to demonstrate the following deficits: muscle weakness, decreased cardiorespiratoy endurance, impaired timing and sequencing, unbalanced muscle activation, and decreased coordination, and decreased sitting balance, decreased postural control, and decreased balance strategies and therefore will continue to benefit from skilled OT intervention to enhance overall performance with BADL and Reduce care partner burden.  Patient progressing toward long term goals..  Continue plan of care.  OT Short Term Goals Week 3:  OT Short Term Goal 1 (Week 3): Pt will perform sitting EOB/EOM with min A for RUE simple functional or reaching task OT Short Term Goal 1 - Progress (Week 3): Progressing toward goal OT Short Term Goal 2 (Week 3): Pt will doff/don pull over shirt with min A at bed level with HOB elevated OT Short Term Goal 2 -  Progress (Week 3): Met OT Short Term Goal 3 (Week 3): Pt will thread BLE into pants with max A seated in half circle sitting at bed level OT Short Term Goal 3 - Progress (Week 3): Progressing toward goal Week 4:  OT Short Term Goal 1 (Week 4): Pt will perform sitting EOB/EOM with min A for RUE simple functional or reaching task OT Short Term Goal 2 (Week 4): Pt will thread BLE into pants with max A seated in half circle sitting at bed level OT Short Term Goal 3 (Week 4): Pt will perform SB transfer with max A+1    Leroy Libman 11/08/2021, 6:37 AM

## 2021-11-08 NOTE — Progress Notes (Signed)
Continued bowel program from previous shift. Large bm removed out of rectum. Pt tolerated well. Peri care/foley care done.

## 2021-11-09 NOTE — Progress Notes (Signed)
PROGRESS NOTE   Subjective/Complaints:  Patient seen at bedside. No acute complaints. No events overnight. Pt states he continues some bowel incontinence episodes during daytime, but these are imporving. He did state he was a "morning person" for Bms prior to his injury; discussed option of moving bowel program to AM, which he declined due to convenience of his wife performing at nighttime.   Occassional spasticity, improving, not waking him up at nighttime.   Per Onc note yesterday: [facility] agreed to cover his Eligard, so pt received Eligard 22.59m injection today when he was in our cancer center for radiation treatment. I communicated with [facility] our pharmacy, [facility] outpt specify pharmacy and his primary oncologist Dr. YTasia Catchingstoday. I also called pt's wife and informed that the drug is covered, and pt does not need to pay this out of pocket. She very much appreciate our effort to take care of him. I also remind her to follow up with Dr. YTasia Catchingsafter his discharge. He has PMSA scan scheduled on 9/27, after his planned discharge on 9/26.   LBM with bowel program 9/15     11/09/2021    4:19 AM 11/09/2021    4:18 AM 11/08/2021    7:37 PM  Vitals with BMI  Systolic [id number] Diastolic 69 64 69  Pulse 64 69 69     ROS: Denies fevers, chills, N/V, abdominal pain, constipation, diarrhea, SOB, cough, chest pain, new weakness or paraesthesias.    Objective:   No results found. Recent Labs    11/08/21 0930  WBC 4.4  HGB 11.1*  HCT 35.7*  PLT 244   No results for input(s): "NA", "K", "CL", "CO2", "GLUCOSE", "BUN", "CREATININE", "CALCIUM" in the last 72 hours.  Intake/Output Summary (Last 24 hours) at 11/09/2021 1042 Last data filed at 11/09/2021 0845 Gross per 24 hour  Intake 120 ml  Output 1250 ml  Net -1130 ml        Physical Exam: Vital Signs reviewed as above.  Constitutional: No apparent distress. Appropriate  appearance for age.  HENT: Atraumatic, normocephalic. Eyes: PERRL. No apparent EOM deficits.  Cardiovascular: RRR, no murmurs/rub/gallops. No Edema. Peripheral pulses 2+  Respiratory: CTAB. No rales, rhonchi, or wheezing. On RA.  Abdomen: + bowel sounds, normoactive, nondistended  Skin: C/D/I. Dried callus on R plantar foot.  MSK: Moving all 4 limbs in bed. BL LE PRAFOS.  GU: +foley, draining clear urine.  Neurologic exam:  Cognition: AAO to person, place, time and event.  Tone: Sustained clonus on LLE ankle jerk.   PE from prior encounter: General: awake, alert, appropriate, sitting up in bed; NAD HENT: conjugate gaze; oropharynx moist CV: regular rate; no JVD Pulmonary: CTA B/L; no W/R/R- good air movement GI: soft, NT, ND, (+)BS Psychiatric: appropriate Neurological: Ox3 Genitourinary: Golden urine in bag, sides of catheter tubing stained red   Musculoskeletal: L shoulder K tape in place- shoulder still subluxed ~1"    Cervical back: Neck supple.   Moving all 4 extremities today; prior exams:  RUE- biceps 5/5; WE 4-/5; Triceps 2+/5; Grip 3-/5; FA 2-/5 LUE- biceps 5-/5; WE 4/5; Triceps 2+/5' Grip 3+/5; FA 2-/5 RLE- 1to 1+/5  HE, KE and trace at anke LLE- HF 1+/5; KE -/5; DF 1/5; PF 2/5; EHL 1/5   Decreased to light touch from L1 inferiorly; note sacrals not tested RLE spasms decreased; none in LLE       Assessment/Plan: 1. Functional deficits which require 3+ hours per day of interdisciplinary therapy in a comprehensive inpatient rehab setting. Physiatrist is providing close team supervision and 24 hour management of active medical problems listed below. Physiatrist and rehab team continue to assess barriers to discharge/monitor patient progress toward functional and medical goals  Care Tool:  Bathing    Body parts bathed by patient: Face   Body parts bathed by helper: Right arm, Left arm, Chest, Abdomen, Front perineal area, Buttocks, Left lower leg, Right lower leg,  Left upper leg, Right upper leg     Bathing assist Assist Level: Dependent - Patient 0%     Upper Body Dressing/Undressing Upper body dressing   What is the patient wearing?: Pull over shirt    Upper body assist Assist Level: Minimal Assistance - Patient > 75%    Lower Body Dressing/Undressing Lower body dressing      What is the patient wearing?: Pants     Lower body assist Assist for lower body dressing: Total Assistance - Patient < 25%     Toileting Toileting    Toileting assist Assist for toileting: Dependent - Patient 0%     Transfers Chair/bed transfer  Transfers assist  Chair/bed transfer activity did not occur: Safety/medical concerns  Chair/bed transfer assist level: 2 Helpers     Locomotion Ambulation   Ambulation assist   Ambulation activity did not occur: Safety/medical concerns          Walk 10 feet activity   Assist  Walk 10 feet activity did not occur: Safety/medical concerns        Walk 50 feet activity   Assist Walk 50 feet with 2 turns activity did not occur: Safety/medical concerns         Walk 150 feet activity   Assist Walk 150 feet activity did not occur: Safety/medical concerns         Walk 10 feet on uneven surface  activity   Assist Walk 10 feet on uneven surfaces activity did not occur: Safety/medical concerns         Wheelchair     Assist Is the patient using a wheelchair?: Yes Type of Wheelchair: Power Wheelchair activity did not occur: Safety/medical concerns  Wheelchair assist level: Supervision/Verbal cueing Max wheelchair distance: >300    Wheelchair 50 feet with 2 turns activity    Assist    Wheelchair 50 feet with 2 turns activity did not occur: Safety/medical concerns   Assist Level: Supervision/Verbal cueing   Wheelchair 150 feet activity     Assist  Wheelchair 150 feet activity did not occur: Safety/medical concerns   Assist Level: Supervision/Verbal cueing    Blood pressure 125/69, pulse 64, temperature 98.1 F (36.7 C), resp. rate 18, height '5\' 9"'  (1.753 m), weight 99.9 kg, SpO2 99 %.  Medical Problem List and Plan: 1. 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  Antithrombotics: -DVT/anticoagulation:  Pharmaceutical: Lovenox.         9/13- spoke with Oncology- agree with me that needs Eliquis for a total of 3 months, even though DVT is gone, is at very high risk due to CA and new SCI.              -antiplatelet therapy: ASA 3. Pain: continue Oxycodone prn. 8/28- pt reports no significant pain- and doesn't want spasticity meds at this time. 9/15- pain controlled- con't regimen prn  4. Mood/Behavior/Sleep: LCSW to follow for evaluation and support.              -antipsychotic agents: N/A 5. Neuropsych/cognition: This patient is capable of making decisions on his own behalf. 6. Skin/Wound Care: Routine pressure relief measures              --monitor incision for healing.  7. Fluids/Electrolytes/Nutrition: Monitor I/O. Check CMET in am.  8. Prostate AdenoCA w/mets: Loading dose Firmagon 240 mg 10/06/21 per Dr. Tasia Catchings --Charlesetta Garibaldi changed to [facility] due to insurance coverage--next dose around 09/13. --Simulation for XRT 08/31 at 2 pm at [facility] 9/6- has appt 10/11 for Eliguard and MD f/u.  9/13- actually XRT will stop 9/20 per Oncology.  9. Dysphagia: pt now on regular/thins 10. Neurogenic bladder: continue foley for now             8/31- pt doesn't want to remove to  see if can void; or to do in/out caths- thinks too much stress on family.              9/3- still has some brownish  urine- see through. 9/7 urine generally clear. May have some periodic blood d/t anticoagulation 9/13- will  check with nursing to see when Catheter/Foley needs to be changed.  9/14- due to be change 9/18- will change foley 9/18  11. Neurogenic bowel: Will get KUB to determine stool burden --continue Colace in am with suppository after supper.  8/25- had good results with bowel program 8/27- good results with bowel program yesterday 8/28- having bowel accidents- and BM with bowel program last 3 nights-  8/29- 2 small BM's last night after bowel program- con't regimen 9/2-9/3 Having results with bowel program nightly           9/5- Didn't have results last night- but had before- will con't to monitor 9/8 pt had large, timely result with fleet enema last night. Continue thru weekend and perhaps convert back to suppository Monday.  9/11 Had large BM with enema, will continue tonight, consider restart suppository tomorrow 9/12- will change back to dulcolax supp as of tonight- still need dig stim- restarted bowel program             9/13- still having bowel accidents, but less frequent             9/14- wife to learn bowel program this evening- educated that needs to do nightly to get him on regimen- takes ~ 6 weeks to train gut 9/15 BM with dig stim overnight. Per patient preference, will keep PM bowel program.   12. Sepsis/Likely aspiration PNA: Now on Augmentin--antibiotic D#5/10. BC pend-->neg so far.              --Follow up CXR in am.              --pulmonary hygiene for atelectasis/Asymmetric volume loss on right.  13. Abnormal LFTs: Improved overall  except for elevation in Aphos--315. Will recheck in am             8/25- Alk phos up to 457; AST 62 and ALT 96- except for alk phos, they are heading downwards- con't to monitor weekly.  14. ABLA: improving, monitor weekly    15. Leucocytosis: Recheck in am. Monitor for fevers/other signs of infection.               8/25- WBC 17.1k- on Augmentin- will recheck labs in AM             -WBC improved to 15.6 10/19/21             8/28- Down to 10.8k-  con't to monitor             8/31- WBC 9.5k- doing better            9/3 - 7.9--->4.6 on 9/9, improved 16. Hypotension: continue midodrine.              9/11 BP has been stable, denies symptoms, continue midodrine  17. DVT left gastrocnemius vein             -8/26 Noted Lovenox was increased 8/25 to 11m BID, Distal DVT, will recheck in 1-2 weeks, compression stockings,              9/6- needs to go home on Tx lovenox- will need a minimum of 3 months due to DVT- cannot switch to Eliquis, etc since cancer related DVTs need Lovenox!             9/12- New Dopplers show no DVT- will d/w Oncology the plan since no DVT now, HOWEVER did have one and Cancer dramatically increases his risk.              9/13- put on Eliquis per Oncology for a minimum of 3 months  18. Spasticity             8/28- pt doesn't want spasticity meds- isn't painful or annoying.              9/6- Spasticity somewhat better today             9/14- educated can get worse for up to 2 years 19. Dispo             8/28- will d/c staples and [facility] 8/29- need to see about FMills Koller-supposed to be given 9/12- Oncology to handle it- L/M/via PA about this.  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 Left hand PIP digit swelling: placed OT order for kinesiotaping and nursing order for ice application-improved 21. Post nasal  drip             9/13- will add Flonase daily for cough related to  nasal drip-             9/14- helping wishes had started "years ago".   22. Flu shot- will get tomorrow- ordered             9/15- to get this AM    LOS: 23 days A FACE TO [place] 11/09/2021, 10:42 AM

## 2021-11-09 NOTE — Progress Notes (Signed)
RN provided education on digital stimulation and bowel program.  Suppository and digital stimulation demonstrated by spouse.  All questions answered by RN.  Will continue to provide education as needed.

## 2021-11-09 NOTE — Progress Notes (Signed)
MD confirmed urinary catheter to be changed 9/18.  Urine yellow/clear today.  Will continue to monitor.

## 2021-11-10 MED ORDER — BENZOCAINE 20 % MT AERO
INHALATION_SPRAY | Freq: Three times a day (TID) | OROMUCOSAL | Status: DC | PRN
  Administered 2021-11-11: 1 via OROMUCOSAL
  Filled 2021-11-10: qty 57

## 2021-11-10 NOTE — Progress Notes (Signed)
Physical Therapy Session Note  Patient Details  Name: Jason [name] MRN: [id number] Date of Birth: Aug 05, 1955  Today's Date: 11/10/2021 PT Individual Time: 0800-0925 PT Individual Time Calculation (min): 85 min   Short Term Goals: Week 3:  PT Short Term Goal 1 (Week 3): Pt will perform SBT with max A x 1 consistently PT Short Term Goal 2 (Week 3): Pt will perform bed mobility max A x 1 PT Short Term Goal 3 (Week 3): Pt and wife will initiate hoyer lift training  Skilled Therapeutic Interventions/Progress Updates: Pt presented in bed with wife Jason [name] present agreeable to therapy. Pt denies pain during session. PTA and tech donned TED hose and shorts total A. Continued hands on family training per wife's request and pt/wife donned shirt with PTA providing cues for body mechanics and use of bed features for leverage. PTA donned shoes and wife assisted in placing hoyer pad with PTA providing minA for cues, rolling technique and hoyer pad placement. On first attempt pad was placed a bit too high therefore PTA assisted in adjusting pad lower on pt's body. PTA also discussed will obtain different pad and fit will not always be perfect. Wife/PTA/tech then performed [name] transfer to [name] with PTA providing more verbal cues than physical assistance. Pt's wife then worked on repositioning pt with PTA providing cues for improved body mechanics. Pt then navigated to day room and had wife practice Slide board transfer. Wife did require increased time for Slide board set up and wife initially began transfer using [place]ath technique. Due to pt's longer torso suggested [name] to place hands under below axilla at hips for decreased strain on back. Wife was able to perform via this technique and wife performed Slide board transfer to mat with maxA x 1 and PTA present for safety. Wife then transferred pt back to [facility] via same technique and positioning with pt requiring less time to transfer back to [name]. [name] did require  minA from PTA for repositioning pt however moreso due to time limitation. Once pt appropriately in [name] pt and wife returned to room at end of session with current needs met.   Pt's wife noted early in session does not feel 100% comfortable navigating/repositioning PWC (when pt not in it). Suggested that during times when pt not in chair to sit in [place] a practice navigating, with wife verbalizing understanding.      Therapy Documentation Precautions:  Precautions Precautions: Fall, Cervical Precaution Comments: C6 laminectomy, MAP >65 Restrictions Weight Bearing Restrictions: Yes RUE Weight Bearing: Weight bearing as tolerated General:   Vital Signs:   Pain:   Mobility:   Locomotion :    Trunk/Postural Assessment :    Balance:   Exercises:   Other Treatments:      Therapy/Group: Individual Therapy  Natayla Cadenhead 11/10/2021, 9:49 AM

## 2021-11-11 ENCOUNTER — Ambulatory Visit: Payer: BC Managed Care – PPO

## 2021-11-11 ENCOUNTER — Ambulatory Visit
Admit: 2021-11-11 | Discharge: 2021-11-11 | Disposition: A | Discharge status: Home / Self Care | Payer: BC Managed Care – PPO | Attending: Radiation Oncology | Admitting: Radiation Oncology

## 2021-11-11 ENCOUNTER — Other Ambulatory Visit: Payer: Self-pay

## 2021-11-11 DIAGNOSIS — Z79899 Other long term (current) drug therapy: Secondary | ICD-10-CM | POA: Diagnosis not present

## 2021-11-11 DIAGNOSIS — Z510 Encounter for antineoplastic radiation therapy: Secondary | ICD-10-CM | POA: Diagnosis not present

## 2021-11-11 DIAGNOSIS — C7951 Secondary malignant neoplasm of bone: Secondary | ICD-10-CM | POA: Diagnosis not present

## 2021-11-11 DIAGNOSIS — C7931 Secondary malignant neoplasm of brain: Secondary | ICD-10-CM | POA: Diagnosis not present

## 2021-11-11 DIAGNOSIS — I10 Essential (primary) hypertension: Secondary | ICD-10-CM | POA: Diagnosis not present

## 2021-11-11 DIAGNOSIS — E785 Hyperlipidemia, unspecified: Secondary | ICD-10-CM | POA: Diagnosis not present

## 2021-11-11 DIAGNOSIS — C61 Malignant neoplasm of prostate: Secondary | ICD-10-CM | POA: Diagnosis not present

## 2021-11-11 DIAGNOSIS — C490 Malignant neoplasm of connective and soft tissue of head, face and neck: Secondary | ICD-10-CM | POA: Diagnosis not present

## 2021-11-11 LAB — RAD ONC ARIA SESSION SUMMARY
Course Elapsed Days: 11
Plan Fractions Treated to Date: 8
Plan Prescribed Dose Per Fraction: 3 Gy
Plan Total Fractions Prescribed: 10
Plan Total Prescribed Dose: 30 Gy
Reference Point Dosage Given to Date: 24 Gy
Reference Point Session Dosage Given: 3 Gy
Session Number: 8

## 2021-11-11 LAB — BASIC METABOLIC PANEL
Anion gap: 5 (ref 5–15)
BUN: 22 mg/dL (ref 8–23)
CO2: 27 mmol/L (ref 22–32)
Calcium: 8.6 mg/dL — ABNORMAL LOW (ref 8.9–10.3)
Chloride: 106 mmol/L (ref 98–111)
Creatinine, Ser: 0.53 mg/dL — ABNORMAL LOW (ref 0.61–1.24)
GFR, Estimated: >60 mL/min (ref >60)
Glucose, Bld: 104 mg/dL — ABNORMAL HIGH (ref 70–99)
Potassium: 3.9 mmol/L (ref 3.5–5.1)
Sodium: 138 mmol/L (ref 135–145)

## 2021-11-11 LAB — CBC
HCT: 32.6 % — ABNORMAL LOW (ref 39.0–52.0)
Hemoglobin: 10.9 g/dL — ABNORMAL LOW (ref 13.0–17.0)
MCH: 32.5 pg (ref 26.0–34.0)
MCHC: 33.4 g/dL (ref 30.0–36.0)
MCV: 97.3 fL (ref 80.0–100.0)
Platelets: 202 10*3/uL (ref 150–400)
RBC: 3.35 MIL/uL — ABNORMAL LOW (ref 4.22–5.81)
RDW: 16.4 % — ABNORMAL HIGH (ref 11.5–15.5)
WBC: 5.1 10*3/uL (ref 4.0–10.5)
nRBC: 0 % (ref 0.0–0.2)

## 2021-11-11 NOTE — Progress Notes (Signed)
Occupational Therapy Session Note  Patient Details  Name: [name] MRN: [id number] Date of Birth: 1955-08-16  Today's Date: 11/11/2021 OT Individual Time: 2800-3491 OT Individual Time Calculation (min): 75 min    Short Term Goals: Week 4:  OT Short Term Goal 1 (Week 4): Pt will perform sitting EOB/EOM with min A for RUE simple functional or reaching task OT Short Term Goal 2 (Week 4): Pt will thread BLE into pants with max A seated in half circle sitting at bed level OT Short Term Goal 3 (Week 4): Pt will perform SB transfer with max A+1  Skilled Therapeutic Interventions/Progress Updates:    Pt resting in bed upon arrival. OT intervention with focus on dressing at bed level, bed mobility, SB transfers, activity [place], and safety awareness to increase independence with BADLs. With HOB elevated, pt required mod A to position in unsupported sitting to doff/don pullover shirt. Pt doffed shirt with min A for sitting balance at bed level. PT donned shirt with min A to pull over trunk. Pt used dressing stick to assist with pushing shirt over Lt shoulder. Pt pull pants up to hips after threaded. Rolling to Lt with min A after RLE positioned. Mod A for rolling to Rt. Supine>sit EOB with max A. SB tranfser to [place] with max A+1 and +1 for safety with SB. Kinesio Tape applied to Lt hand for edema mgmt. Pt remained in [place] with all needs within reach.   Therapy Documentation Precautions:  Precautions Precautions: Fall, Cervical Precaution Comments: C6 laminectomy, MAP >65 Restrictions Weight Bearing Restrictions: Yes RUE Weight Bearing: Weight bearing as tolerated Pain:    Therapy/Group: Individual Therapy  Leroy Libman 11/11/2021, 12:13 PM

## 2021-11-11 NOTE — Progress Notes (Signed)
Occupational Therapy Session Note  Patient Details  Name: ZACKERIE SARA MRN: [id number] Date of Birth: 06/29/1955  Today's Date: 11/11/2021 OT Individual Time: 1300-1330 OT Individual Time Calculation (min): 30 min    Short Term Goals: Week 4:  OT Short Term Goal 1 (Week 4): Pt will perform sitting EOB/EOM with min A for RUE simple functional or reaching task OT Short Term Goal 2 (Week 4): Pt will thread BLE into pants with max A seated in half circle sitting at bed level OT Short Term Goal 3 (Week 4): Pt will perform SB transfer with max A+1  Skilled Therapeutic Interventions/Progress Updates:    OT intervention with focus on RUE strengthening through reaching/punching activities with 1# wrist weight- 3x20 punches to tap beach ball, 3x20 punches, 3x10 horizontal abduction/adduction reaching tasks. Pt's wife completed SB transfer with CGA/min A from OTA. Max+2 for sit>supine. Pt reamined in bed with wife present.   Therapy Documentation Precautions:  Precautions Precautions: Fall, Cervical Precaution Comments: C6 laminectomy, MAP >65 Restrictions Weight Bearing Restrictions: Yes RUE Weight Bearing: Weight bearing as tolerated   Pain:  Pt denies pain this afternoon   Therapy/Group: Individual Therapy  Leroy Libman 11/11/2021, 2:35 PM

## 2021-11-11 NOTE — Progress Notes (Signed)
Jason [name], Jason [name] ([id number]) Visit Report for 08/07/2021 Chief Complaint Document Details Patient Name: Jason [name], Jason [name]. Date of Service: 08/07/2021 2:45 PM Medical Record Number: [id number] Patient Account Number: [id number] Date of Birth/Sex: 10-05-1955 (66 y.o. M) Treating RN: Carlene Coria Primary Care Provider: Viviana Simpler Other Clinician: Referring Provider: Viviana Simpler Treating Provider/Extender: Yaakov Guthrie in Treatment: 4 Information Obtained from: Patient Chief Complaint 07/10/2021; left elbow ulcer Electronic Signature(s) Signed: 08/07/2021 4:11:59 PM By: Kalman Shan DO Entered By: Kalman Shan on 08/07/2021 15:59:19 Jason [name] ([id number]) -------------------------------------------------------------------------------- HPI Details Patient Name: Jason [name] Date of Service: 08/07/2021 2:45 PM Medical Record Number: [id number] Patient Account Number: [id number] Date of Birth/Sex: [birth year] (66 y.o. M) Treating RN: Carlene Coria Primary Care Provider: Viviana Simpler Other Clinician: Referring Provider: Viviana Simpler Treating Provider/Extender: Yaakov Guthrie in Treatment: 4 History of Present Illness HPI Description: Admission 07/10/2021 Jason [name] is a 66 year old male with a past medical history of left rotator cuff status post shoulder surgery 04/18/2021, hypertension that presents to the clinic for a 6-week history of nonhealing ulcer to the left elbow. He has been wearing a sling for the past 2 to 3 months and this created a pressure injury to his left elbow that eventually opened. He has been using Betadine to the area and keeping it covered. He has cut his sling to relieve some of the pressure. He does sleep in a recliner putting pressure on the armrest to his elbow. He currently denies signs of infection. 5/24; patient presents for follow-up. He obtained Keystone antibiotics today. He has been using mupirocin  ointment with collagen daily to the wound bed. He has no issues or complaints today. He denies signs of infection. 5/31; patient presents for follow-up. He has been using Keystone antibiotics daily. He has no issues or complaints today. He denies signs of infection. He has noticed improvement in wound healing. 08-01-2021 upon evaluation today patient appears to be doing well with regard to his elbow ulcer he does have a small what appears to be calcium deposit in the middle of the wound I think we need to try to clean this out to allow it to heal more effectively other than that though this seems to be doing excellent in my opinion. I see no signs of active infection locally or systemically. 6/14; patient presents for follow-up. He has been using Keystone antibiotic to his left elbow wound. He denies signs of infection. He has no issues or complaints today. Electronic Signature(s) Signed: 08/07/2021 4:11:59 PM By: Kalman Shan DO Entered By: Kalman Shan on 08/07/2021 16:01:13 Jason [name] ([id number]) -------------------------------------------------------------------------------- Physical Exam Details Patient Name: Jason [name] Date of Service: 08/07/2021 2:45 PM Medical Record Number: [id number] Patient Account Number: [id number] Date of Birth/Sex: December 18, 1955 (66 y.o. M) Treating RN: Carlene Coria Primary Care Provider: Viviana Simpler Other Clinician: Referring Provider: Viviana Simpler Treating Provider/Extender: Yaakov Guthrie in Treatment: 4 Constitutional . Psychiatric . Notes Left elbow: Open wound with granulation tissue at the opening. Minimal undermining noted. Electronic Signature(s) Signed: 08/07/2021 4:11:59 PM By: Kalman Shan DO Entered By: Kalman Shan on 08/07/2021 16:02:57 Jason [name] ([id number]) -------------------------------------------------------------------------------- Physician Orders Details Patient Name: Jason [name] Date of Service: 08/07/2021 2:45 PM Medical Record Number: [id number] Patient Account Number: [id number] Date of Birth/Sex: 03-11-1955 (66 y.o. M) Treating RN: Carlene Coria Primary Care Provider: Viviana Simpler Other Clinician: Referring Provider: Viviana Simpler Treating Provider/Extender: Yaakov Guthrie in Treatment: 4 Verbal / Phone Orders: No Diagnosis  Coding Follow-up Appointments o Return Appointment in 2 weeks. Bathing/ Shower/ Hygiene o May shower; gently cleanse wound with antibacterial soap, rinse and pat dry prior to dressing wounds Wound Treatment Wound #1 - Elbow Wound Laterality: Left Cleanser: Byram Ancillary Kit - 15 Day Supply (Generic) 1 x Per Day/30 Days Discharge Instructions: Use supplies as instructed; Kit contains: (15) Saline Bullets; (15) 3x3 Gauze; 15 pr Gloves Primary Dressing: Keystone compound 1 x Per Day/30 Days Discharge Instructions: apply to wound bed Secondary Dressing: (SILCONE BORDER) Zetuvit Plus SILICONE BORDER Dressing 5x5 (in/in) (DME) (Generic) 1 x Per Day/30 Days Discharge Instructions: Please do not put silicone bordered dressings under wraps. Use non-bordered dressing only. Electronic Signature(s) Signed: 08/07/2021 4:11:59 PM By: Kalman Shan DO Entered By: Kalman Shan on 08/07/2021 16:04:12 Jason [name] ([id number]) -------------------------------------------------------------------------------- Problem List Details Patient Name: Jason [name] Date of Service: 08/07/2021 2:45 PM Medical Record Number: [id number] Patient Account Number: [id number] Date of Birth/Sex: Dec 05, 1955 (66 y.o. M) Treating RN: Carlene Coria Primary Care Provider: Viviana Simpler Other Clinician: Referring Provider: Viviana Simpler Treating Provider/Extender: Yaakov Guthrie in Treatment: 4 Active Problems ICD-10 Encounter Code Description Active Date MDM Diagnosis L89.003 Pressure ulcer of unspecified elbow, stage  3 07/10/2021 No Yes M75.102 Unspecified rotator cuff tear or rupture of left shoulder, not specified as 07/10/2021 No Yes traumatic I10 Essential (primary) hypertension 07/10/2021 No Yes Inactive Problems Resolved Problems Electronic Signature(s) Signed: 08/07/2021 4:11:59 PM By: Kalman Shan DO Entered By: Kalman Shan on 08/07/2021 15:59:14 Jason [name] ([id number]) -------------------------------------------------------------------------------- Progress Note Details Patient Name: Jason [name]. Date of Service: 08/07/2021 2:45 PM Medical Record Number: [id number] Patient Account Number: [id number] Date of Birth/Sex: Dec 10, 1955 (66 y.o. M) Treating RN: Carlene Coria Primary Care Provider: Viviana Simpler Other Clinician: Referring Provider: Viviana Simpler Treating Provider/Extender: Yaakov Guthrie in Treatment: 4 Subjective Chief Complaint Information obtained from Patient 07/10/2021; left elbow ulcer History of Present Illness (HPI) Admission 07/10/2021 Jason [name] is a 66 year old male with a past medical history of left rotator cuff status post shoulder surgery 04/18/2021, hypertension that presents to the clinic for a 6-week history of nonhealing ulcer to the left elbow. He has been wearing a sling for the past 2 to 3 months and this created a pressure injury to his left elbow that eventually opened. He has been using Betadine to the area and keeping it covered. He has cut his sling to relieve some of the pressure. He does sleep in a recliner putting pressure on the armrest to his elbow. He currently denies signs of infection. 5/24; patient presents for follow-up. He obtained Keystone antibiotics today. He has been using mupirocin ointment with collagen daily to the wound bed. He has no issues or complaints today. He denies signs of infection. 5/31; patient presents for follow-up. He has been using Keystone antibiotics daily. He has no issues or  complaints today. He denies signs of infection. He has noticed improvement in wound healing. 08-01-2021 upon evaluation today patient appears to be doing well with regard to his elbow ulcer he does have a small what appears to be calcium deposit in the middle of the wound I think we need to try to clean this out to allow it to heal more effectively other than that though this seems to be doing excellent in my opinion. I see no signs of active infection locally or systemically. 6/14; patient presents for follow-up. He has been using Keystone antibiotic to his left elbow wound. He denies signs of  infection. He has no issues or complaints today. Objective Constitutional Vitals Time Taken: 3:06 PM, Height: 69 in, Weight: 189 lbs, BMI: 27.9, Temperature: 98.3 F, Pulse: 69 bpm, Respiratory Rate: 18 breaths/min, Blood Pressure: 152/82 mmHg. General Notes: Left elbow: Open wound with granulation tissue at the opening. Minimal undermining noted. Integumentary (Hair, Skin) Wound #1 status is Open. Original cause of wound was Pressure Injury. The date acquired was: 06/24/2021. The wound has been in treatment 4 weeks. The wound is located on the Left Elbow. The wound measures 0.4cm length x 0.4cm width x 0.2cm depth; 0.126cm^2 area and 0.025cm^3 volume. There is Fat Layer (Subcutaneous Tissue) exposed. There is no tunneling or undermining noted. There is a medium amount of serosanguineous drainage noted. There is large (67-100%) pink granulation within the wound bed. There is no necrotic tissue within the wound bed. Assessment Active Problems ICD-10 Pressure ulcer of unspecified elbow, stage 3 Unspecified rotator cuff tear or rupture of left shoulder, not specified as traumatic Essential (primary) hypertension Jason [name], Jason E. ([id number]) Patient's wound has shown improvement in size and appearance since last clinic visit. No need for debridement today. I recommended continuing with Keystone  antibiotics. Follow-up in 2 weeks. Plan Follow-up Appointments: Return Appointment in 2 weeks. Bathing/ Shower/ Hygiene: May shower; gently cleanse wound with antibacterial soap, rinse and pat dry prior to dressing wounds WOUND #1: - Elbow Wound Laterality: Left Cleanser: Byram Ancillary Kit - 15 Day Supply (Generic) 1 x Per Day/30 Days Discharge Instructions: Use supplies as instructed; Kit contains: (15) Saline Bullets; (15) 3x3 Gauze; 15 pr Gloves Primary Dressing: Keystone compound 1 x Per Day/30 Days Discharge Instructions: apply to wound bed Secondary Dressing: (SILCONE BORDER) Zetuvit Plus SILICONE BORDER Dressing 5x5 (in/in) (DME) (Generic) 1 x Per Day/30 Days Discharge Instructions: Please do not put silicone bordered dressings under wraps. Use non-bordered dressing only. 1. Keystone antibiotics 2. Follow-up in 2 weeks Electronic Signature(s) Signed: 08/07/2021 4:11:59 PM By: Kalman Shan DO Entered By: Kalman Shan on 08/07/2021 16:03:45 Jason [name] ([id number]) -------------------------------------------------------------------------------- ROS/PFSH Details Patient Name: Jason [name] Date of Service: 08/07/2021 2:45 PM Medical Record Number: [id number] Patient Account Number: [id number] Date of Birth/Sex: [birth year] (66 y.o. M) Treating RN: Carlene Coria Primary Care Provider: Viviana Simpler Other Clinician: Referring Provider: Viviana Simpler Treating Provider/Extender: Yaakov Guthrie in Treatment: 4 Cardiovascular Medical History: Positive for: Hypertension Immunizations Pneumococcal Vaccine: Received Pneumococcal Vaccination: No Implantable Devices None Family and Social History Never smoker; Marital Status - Married; Alcohol Use: Rarely; Drug Use: No History; Caffeine Use: Daily Electronic Signature(s) Signed: 08/07/2021 4:11:59 PM By: Kalman Shan DO Signed: 11/11/2021 2:33:39 PM By: Carlene Coria RN Entered By: Kalman Shan on  08/07/2021 16:04:23 Jason [name] ([id number]) -------------------------------------------------------------------------------- SuperBill Details Patient Name: Jason [name]. Date of Service: 08/07/2021 Medical Record Number: [id number] Patient Account Number: [id number] Date of Birth/Sex: 04-25-1955 (66 y.o. M) Treating RN: Carlene Coria Primary Care Provider: Viviana Simpler Other Clinician: Referring Provider: Viviana Simpler Treating Provider/Extender: Yaakov Guthrie in Treatment: 4 Diagnosis Coding ICD-10 Codes Code Description L89.003 Pressure ulcer of unspecified elbow, stage 3 M75.102 Unspecified rotator cuff tear or rupture of left shoulder, not specified as traumatic I10 Essential (primary) hypertension Facility Procedures CPT4 Code: [id number] Description: [id number] - WOUND CARE VISIT-LEV 2 EST PT Modifier: Quantity: 1 Physician Procedures CPT4 Code: 4920100 Description: 71219 - WC PHYS LEVEL 3 - EST PT Modifier: Quantity: 1 CPT4 Code: Description: ICD-10 Diagnosis Description L89.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

## 2021-11-11 NOTE — Progress Notes (Signed)
Jason [name] ([id number]) Visit Report for 08/07/2021 Arrival Information Details Patient Name: Jason [name], Jason [name]. Date of Service: 08/07/2021 2:45 PM Medical Record Number: [id number] Patient Account Number: [id number] Date of Birth/Sex: Oct 03, 1955 (66 y.o. M) Treating RN: Carlene Coria Primary Care Cianna Kasparian: Viviana Simpler Other Clinician: Referring Maxxon Schwanke: Viviana Simpler Treating Koa Palla/Extender: Yaakov Guthrie in Treatment: 4 Visit Information History Since Last Visit All ordered tests and consults were completed: No Patient Arrived: Ambulatory Added or deleted any medications: No Arrival Time: 15:02 Any new allergies or adverse reactions: No Accompanied By: self Had a fall or experienced change in No Transfer Assistance: None activities of daily living that may affect Patient Identification Verified: Yes risk of falls: Secondary Verification Process Completed: Yes Signs or symptoms of abuse/neglect since last visito No Patient Requires Transmission-Based Precautions: No Hospitalized since last visit: No Patient Has Alerts: No Implantable device outside of the clinic excluding No cellular tissue based products placed in the center since last visit: Has Dressing in Place as Prescribed: Yes Pain Present Now: No Electronic Signature(s) Signed: 11/11/2021 2:33:39 PM By: Carlene Coria RN Entered By: Carlene Coria on 08/07/2021 15:05:37 Jason [name] ([id number]) -------------------------------------------------------------------------------- Clinic Level of Care Assessment Details Patient Name: Jason [name] Date of Service: 08/07/2021 2:45 PM Medical Record Number: [id number] Patient Account Number: [id number] Date of Birth/Sex: Aug 01, 1955 (66 y.o. M) Treating RN: Carlene Coria Primary Care Adisyn Ruscitti: Viviana Simpler Other Clinician: Referring Tameyah Koch: Viviana Simpler Treating Ranald Alessio/Extender: Yaakov Guthrie in Treatment: 4 Clinic Level of  Care Assessment Items TOOL 4 Quantity Score X - Use when only an EandM is performed on FOLLOW-UP visit 1 0 ASSESSMENTS - Nursing Assessment / Reassessment '[]'$  - Reassessment of Co-morbidities (includes updates in patient status) 0 '[]'$  - 0 Reassessment of Adherence to Treatment Plan ASSESSMENTS - Wound and Skin Assessment / Reassessment X - Simple Wound Assessment / Reassessment - one wound 1 5 '[]'$  - 0 Complex Wound Assessment / Reassessment - multiple wounds '[]'$  - 0 Dermatologic / Skin Assessment (not related to wound area) ASSESSMENTS - Focused Assessment '[]'$  - Circumferential Edema Measurements - multi extremities 0 '[]'$  - 0 Nutritional Assessment / Counseling / Intervention '[]'$  - 0 Lower Extremity Assessment (monofilament, tuning fork, pulses) '[]'$  - 0 Peripheral Arterial Disease Assessment (using hand held doppler) ASSESSMENTS - Ostomy and/or Continence Assessment and Care '[]'$  - Incontinence Assessment and Management 0 '[]'$  - 0 Ostomy Care Assessment and Management (repouching, etc.) PROCESS - Coordination of Care X - Simple Patient / Family Education for ongoing care 1 15 '[]'$  - 0 Complex (extensive) Patient / Family Education for ongoing care '[]'$  - 0 Staff obtains Programmer, systems, Records, Test Results / Process Orders '[]'$  - 0 Staff telephones HHA, Nursing Homes / Clarify orders / etc '[]'$  - 0 Routine Transfer to another Facility (non-emergent condition) '[]'$  - 0 Routine Hospital Admission (non-emergent condition) '[]'$  - 0 New Admissions / Biomedical engineer / Ordering NPWT, Apligraf, etc. '[]'$  - 0 Emergency Hospital Admission (emergent condition) X- 1 10 Simple Discharge Coordination '[]'$  - 0 Complex (extensive) Discharge Coordination PROCESS - Special Needs '[]'$  - Pediatric / Minor Patient Management 0 '[]'$  - 0 Isolation Patient Management '[]'$  - 0 Hearing / Language / Visual special needs '[]'$  - 0 Assessment of Community assistance (transportation, D/C planning, etc.) '[]'$  - 0 Additional  assistance / Altered mentation '[]'$  - 0 Support Surface(s) Assessment (bed, cushion, seat, etc.) INTERVENTIONS - Wound Cleansing / Measurement Tamargo, Jaquane E. ([id number]) X- 1 5 Simple Wound Cleansing - one  wound '[]'$  - 0 Complex Wound Cleansing - multiple wounds X- 1 5 Wound Imaging (photographs - any number of wounds) '[]'$  - 0 Wound Tracing (instead of photographs) X- 1 5 Simple Wound Measurement - one wound '[]'$  - 0 Complex Wound Measurement - multiple wounds INTERVENTIONS - Wound Dressings X - Small Wound Dressing one or multiple wounds 1 10 '[]'$  - 0 Medium Wound Dressing one or multiple wounds '[]'$  - 0 Large Wound Dressing one or multiple wounds '[]'$  - 0 Application of Medications - topical '[]'$  - 0 Application of Medications - injection INTERVENTIONS - Miscellaneous '[]'$  - External ear exam 0 '[]'$  - 0 Specimen Collection (cultures, biopsies, blood, body fluids, etc.) '[]'$  - 0 Specimen(s) / Culture(s) sent or taken to Lab for analysis '[]'$  - 0 Patient Transfer (multiple staff / Civil Service fast streamer / Similar devices) '[]'$  - 0 Simple Staple / Suture removal (25 or less) '[]'$  - 0 Complex Staple / Suture removal (26 or more) '[]'$  - 0 Hypo / Hyperglycemic Management (close monitor of Blood Glucose) '[]'$  - 0 Ankle / Brachial Index (ABI) - do not check if billed separately X- 1 5 Vital Signs Has the patient been seen at the hospital within the last three years: Yes Total Score: 60 Level Of Care: New/Established - Level 2 Electronic Signature(s) Signed: 11/11/2021 2:33:39 PM By: Carlene Coria RN Entered By: Carlene Coria on 08/07/2021 15:40:35 Jason [name] ([id number]) -------------------------------------------------------------------------------- Encounter Discharge Information Details Patient Name: Jason [name]. Date of Service: 08/07/2021 2:45 PM Medical Record Number: [id number] Patient Account Number: [id number] Date of Birth/Sex: Aug 25, 1955 (66 y.o. M) Treating RN: Carlene Coria Primary  Care Nakeitha Milligan: Viviana Simpler Other Clinician: Referring Marri Mcneff: Viviana Simpler Treating Shoji Pertuit/Extender: Yaakov Guthrie in Treatment: 4 Encounter Discharge Information Items Discharge Condition: Stable Ambulatory Status: Ambulatory Discharge Destination: Home Transportation: Private Auto Accompanied By: self Schedule Follow-up Appointment: Yes Clinical Summary of Care: Electronic Signature(s) Signed: 11/11/2021 2:33:39 PM By: Carlene Coria RN Entered By: Carlene Coria on 08/07/2021 15:41:25 Jason [name] ([id number]) -------------------------------------------------------------------------------- Lower Extremity Assessment Details Patient Name: Jason [name]. Date of Service: 08/07/2021 2:45 PM Medical Record Number: [id number] Patient Account Number: [id number] Date of Birth/Sex: 1955/08/01 (66 y.o. M) Treating RN: Carlene Coria Primary Care Hanae Waiters: Viviana Simpler Other Clinician: Referring Ailana Cuadrado: Viviana Simpler Treating Yosselyn Tax/Extender: Yaakov Guthrie in Treatment: 4 Electronic Signature(s) Signed: 11/11/2021 2:33:39 PM By: Carlene Coria RN Entered By: Carlene Coria on 08/07/2021 15:12:08 Jason [name] ([id number]) -------------------------------------------------------------------------------- Multi Wound Chart Details Patient Name: Jason [name]. Date of Service: 08/07/2021 2:45 PM Medical Record Number: [id number] Patient Account Number: [id number] Date of Birth/Sex: 1955-09-20 (66 y.o. M) Treating RN: Carlene Coria Primary Care Rohail Klees: Viviana Simpler Other Clinician: Referring Becci Batty: Viviana Simpler Treating Ambers Iyengar/Extender: Yaakov Guthrie in Treatment: 4 Vital Signs Height(in): 69 Pulse(bpm): 91 Weight(lbs): 189 Blood Pressure(mmHg): 152/82 Body Mass Index(BMI): 27.9 Temperature(F): 98.3 Respiratory Rate(breaths/min): 18 Photos: [N/A:N/A] Wound Location: Left Elbow N/A N/A Wounding Event: Pressure Injury  N/A N/A Primary Etiology: Pressure Ulcer N/A N/A Comorbid History: Hypertension N/A N/A Date Acquired: 06/24/2021 N/A N/A Weeks of Treatment: 4 N/A N/A Wound Status: Open N/A N/A Wound Recurrence: No N/A N/A Measurements L x W x D (cm) 0.4x0.4x0.2 N/A N/A Area (cm) : 0.126 N/A N/A Volume (cm) : 0.025 N/A N/A % Reduction in Area: 54.20% N/A N/A % Reduction in Volume: 69.50% N/A N/A Classification: Category/Stage III N/A N/A Exudate Amount: Medium N/A N/A Exudate Type: Serosanguineous N/A N/A Exudate Color: red, brown N/A N/A Granulation  Amount: Large (67-100%) N/A N/A Granulation Quality: Pink N/A N/A Necrotic Amount: None Present (0%) N/A N/A Exposed Structures: Fat Layer (Subcutaneous Tissue): N/A N/A Yes Fascia: No Tendon: No Muscle: No Joint: No Bone: No Epithelialization: None N/A N/A Treatment Notes Electronic Signature(s) Signed: 11/11/2021 2:33:39 PM By: Carlene Coria RN Entered By: Carlene Coria on 08/07/2021 15:12:27 Jason [name] ([id number]) -------------------------------------------------------------------------------- [place] Details Patient Name: Jason [name]. Date of Service: 08/07/2021 2:45 PM Medical Record Number: [id number] Patient Account Number: [id number] Date of Birth/Sex: September 10, 1955 (66 y.o. M) Treating RN: Carlene Coria Primary Care Willam Munford: Viviana Simpler Other Clinician: Referring Navin Dogan: Viviana Simpler Treating Sena Hoopingarner/Extender: Yaakov Guthrie in Treatment: 4 Active Inactive Wound/Skin Impairment Nursing Diagnoses: Knowledge deficit related to ulceration/compromised skin integrity Goals: Patient/caregiver will verbalize understanding of skin care regimen Date Initiated: 07/10/2021 Target Resolution Date: 08/10/2021 Goal Status: Active Ulcer/skin breakdown will have a volume reduction of 30% by week 4 Date Initiated: 07/10/2021 Target Resolution Date: 08/10/2021 Goal Status: Active Ulcer/skin  breakdown will have a volume reduction of 50% by week 8 Date Initiated: 07/10/2021 Target Resolution Date: 09/09/2021 Goal Status: Active Ulcer/skin breakdown will have a volume reduction of 80% by week 12 Date Initiated: 07/10/2021 Target Resolution Date: 10/10/2021 Goal Status: Active Ulcer/skin breakdown will heal within 14 weeks Date Initiated: 07/10/2021 Target Resolution Date: 11/10/2021 Goal Status: Active Interventions: Assess patient/caregiver ability to obtain necessary supplies Assess patient/caregiver ability to perform ulcer/skin care regimen upon admission and as needed Assess ulceration(s) every visit Notes: Electronic Signature(s) Signed: 11/11/2021 2:33:39 PM By: Carlene Coria RN Entered By: Carlene Coria on 08/07/2021 15:12:12 Jason [name] ([id number]) -------------------------------------------------------------------------------- Pain Assessment Details Patient Name: Jason [name]. Date of Service: 08/07/2021 2:45 PM Medical Record Number: [id number] Patient Account Number: [id number] Date of Birth/Sex: 1956-02-09 (66 y.o. M) Treating RN: Carlene Coria Primary Care Jamaul Heist: Viviana Simpler Other Clinician: Referring Sally Reimers: Viviana Simpler Treating Furman Trentman/Extender: Yaakov Guthrie in Treatment: 4 Active Problems Location of Pain Severity and Description of Pain Patient Has Paino No Site Locations Pain Management and Medication Current Pain Management: Electronic Signature(s) Signed: 11/11/2021 2:33:39 PM By: Carlene Coria RN Entered By: Carlene Coria on 08/07/2021 15:06:27 Jason [name] ([id number]) -------------------------------------------------------------------------------- Patient/Caregiver Education Details Patient Name: Jason [name]. Date of Service: 08/07/2021 2:45 PM Medical Record Number: [id number] Patient Account Number: [id number] Date of Birth/Gender: [birth year] (66 y.o. M) Treating RN: Carlene Coria Primary Care Physician:  Viviana Simpler Other Clinician: Referring Physician: Viviana Simpler Treating Physician/Extender: Yaakov Guthrie in Treatment: 4 Education Assessment Education Provided To: Patient Education Topics Provided Wound/Skin Impairment: Methods: Explain/Verbal Responses: State content correctly Electronic Signature(s) Signed: 11/11/2021 2:33:39 PM By: Carlene Coria RN Entered By: Carlene Coria on 08/07/2021 15:40:53 Jason [name] ([id number]) -------------------------------------------------------------------------------- Wound Assessment Details Patient Name: Jason [name] Date of Service: 08/07/2021 2:45 PM Medical Record Number: [id number] Patient Account Number: [id number] Date of Birth/Sex: [birth year] (66 y.o. M) Treating RN: Carlene Coria Primary Care Sanford Lindblad: Viviana Simpler Other Clinician: Referring Katerina Zurn: Viviana Simpler Treating Calianne Larue/Extender: Yaakov Guthrie in Treatment: 4 Wound Status Wound Number: 1 Primary Etiology: Pressure Ulcer Wound Location: Left Elbow Wound Status: Open Wounding Event: Pressure Injury Comorbid History: Hypertension Date Acquired: 06/24/2021 Weeks Of Treatment: 4 Clustered Wound: No Photos Wound Measurements Length: (cm) 0.4 Width: (cm) 0.4 Depth: (cm) 0.2 Area: (cm) 0.126 Volume: (cm) 0.025 % Reduction in Area: 54.2% % Reduction in Volume: 69.5% Epithelialization: None Tunneling: No Undermining: No Wound Description Classification: Category/Stage III Exudate Amount: Medium Exudate Type: Serosanguineous  Exudate Color: red, brown Foul Odor After Cleansing: No Slough/Fibrino No Wound Bed Granulation Amount: Large (67-100%) Exposed Structure Granulation Quality: Pink Fascia Exposed: No Necrotic Amount: None Present (0%) Fat Layer (Subcutaneous Tissue) Exposed: Yes Tendon Exposed: No Muscle Exposed: No Joint Exposed: No Bone Exposed: No Electronic Signature(s) Signed: 11/11/2021 2:33:39 PM By: Carlene Coria RN Entered By: Carlene Coria on 08/07/2021 15:11:52 Jason [name] ([id number]) -------------------------------------------------------------------------------- Vitals Details Patient Name: Jason [name]. Date of Service: 08/07/2021 2:45 PM Medical Record Number: [id number] Patient Account Number: [id number] Date of Birth/Sex: June 23, 1955 (66 y.o. M) Treating RN: Carlene Coria Primary Care Pasha Broad: Viviana Simpler Other Clinician: Referring Tylan Kinn: Viviana Simpler Treating Arena Lindahl/Extender: Yaakov Guthrie in Treatment: 4 Vital Signs Time Taken: 15:06 Temperature (F): 98.3 Height (in): 69 Pulse (bpm): 69 Weight (lbs): 189 Respiratory Rate (breaths/min): 18 Body Mass Index (BMI): 27.9 Blood Pressure (mmHg): 152/82 Reference Range: 80 - 120 mg / dl Electronic Signature(s) Signed: 11/11/2021 2:33:39 PM By: Carlene Coria RN Entered By: Carlene Coria on 08/07/2021 15:06:16

## 2021-11-11 NOTE — Progress Notes (Signed)
Physical Therapy Weekly Progress Note  Patient Details  Name: Jason [name] MRN: [id number] Date of Birth: 09-23-1955  Beginning of progress report period: November 01, 2021 End of progress report period: November 11, 2021  Today's Date: 11/11/2021 PT Individual Time: 0388-8280 PT Individual Time Calculation (min): 72 min   Patient has met 3 of 3 short term goals.  Pt continues to progress toward LTGs. Pt is able to perform supine>sit with max A and slideboard transfer with max A. Pt's wife has participated in hands on training with both hoyer lift and slideboard transfer successfully. Pt progressing toward rolling in bed, with min A in bed and CGA on mat table. Pt is progressing with w/c navigation and is independent with pressure relief using chair features. Custom w/c eval has been performed and pt is acclimating to loaner chair well.   Patient continues to demonstrate the following deficits muscle weakness and muscle paralysis, decreased cardiorespiratoy endurance, and decreased sitting balance, decreased postural control, and decreased balance strategies and therefore will continue to benefit from skilled PT intervention to increase functional independence with mobility.  Patient progressing toward long term goals..  Continue plan of care.  PT Short Term Goals Week 3:  PT Short Term Goal 1 (Week 3): Pt will perform SBT with max A x 1 consistently PT Short Term Goal 1 - Progress (Week 3): Met PT Short Term Goal 2 (Week 3): Pt will perform bed mobility max A x 1 PT Short Term Goal 2 - Progress (Week 3): Met PT Short Term Goal 3 (Week 3): Pt and wife will initiate hoyer lift training PT Short Term Goal 3 - Progress (Week 3): Met Week 4:  PT Short Term Goal 1 (Week 4): =LTGs d/t ELOS  Skilled Therapeutic Interventions/Progress Updates:  Pt received in [place], agreeable to therapy. No complaint of pain. Pt navigated PWC throughout session with supervision cues for navigation in tight  spaces. Pt then performed slideboard transfer with max A x 1 to mat table. Supine<>sit with + 2 assist for time and energy conservation. Pt participated in bridging 4 x 6-8 for improved lift to assist with dressing and improved strength for transfers. Pt then performed lower trunk rotations with as little as CGA for lower abdominal activation and preparation for rolling. Pt then performed scap punches on bolster in sidelying for improved periscapular engagement during rolling. Progressed to rolling practice progressing from min A to CGA for final attempt. Pt then performed slideboard transfer back to w/c in same manner and assisted with positioning. Pt remained in chair at end of session with call bell in reach.   Therapy Documentation Precautions:  Precautions Precautions: Fall, Cervical Precaution Comments: C6 laminectomy, MAP >65 Restrictions Weight Bearing Restrictions: Yes RUE Weight Bearing: Weight bearing as tolerated General:      Therapy/Group: Individual Therapy  [name] 11/11/2021, 12:23 PM

## 2021-11-11 NOTE — Progress Notes (Signed)
                                                       PROGRESS NOTE   Subjective/Complaints:  Pt with no new complaints or concerns this AM.  LBM with bowel program 9/18     11/11/2021    6:03 AM 11/10/2021    7:53 PM 11/10/2021    2:19 PM  Vitals with BMI  Systolic 129 135 150  Diastolic 67 63 68  Pulse 65 68 66     ROS: Denies fevers, HA, chills, N/V, abdominal pain, constipation, diarrhea, SOB, cough, chest pain, new weakness or paraesthesias.    Objective:   No results found. Recent Labs    11/11/21 0711  WBC 5.1  HGB 10.9*  HCT 32.6*  PLT 202    Recent Labs    11/11/21 0711  NA 138  K 3.9  CL 106  CO2 27  GLUCOSE 104*  BUN 22  CREATININE 0.53*  CALCIUM 8.6*    Intake/Output Summary (Last 24 hours) at 11/11/2021 1254 Last data filed at 11/11/2021 0820 Gross per 24 hour  Intake 120 ml  Output 1625 ml  Net -1505 ml         Physical Exam: Vital Signs reviewed as above.    PE from prior encounter: General: awake, alert, appropriate, sitting up in bed; NAD HENT: conjugate gaze; oropharynx moist CV: regular rate; no JVD Pulmonary: CTA B/L; no W/R/R- good air movement GI: soft, NT, ND, (+)BS Psychiatric: appropriate, pleasant Neurological: Ox3 Genitourinary: Golden urine in bag   Musculoskeletal: L shoulder K tape in place- shoulder still subluxed ~1"    Cervical back: Neck supple.   Moving all 4 extremities today; prior exams:  RUE- biceps 5/5; WE 4-/5; Triceps 2+/5; Grip 3-/5; FA 2-/5 LUE- biceps 5-/5; WE 4/5; Triceps 2+/5' Grip 3+/5; FA 2-/5 RLE- 1to 1+/5 HE, KE and trace at anke LLE- HF 1+/5; KE -/5; DF 1/5; PF 2/5; EHL 1/5   Decreased to light touch from L1 inferiorly; note sacrals not tested RLE spasms decreased; none in LLE       Assessment/Plan: 1. Functional deficits which require 3+ hours per day of interdisciplinary therapy in a comprehensive inpatient rehab setting. Physiatrist is providing close team supervision and 24  hour management of active medical problems listed below. Physiatrist and rehab team continue to assess barriers to discharge/monitor patient progress toward functional and medical goals  Care Tool:  Bathing    Body parts bathed by patient: Face   Body parts bathed by helper: Right arm, Left arm, Chest, Abdomen, Front perineal area, Buttocks, Left lower leg, Right lower leg, Left upper leg, Right upper leg     Bathing assist Assist Level: Dependent - Patient 0%     Upper Body Dressing/Undressing Upper body dressing   What is the patient wearing?: Pull over shirt    Upper body assist Assist Level: Minimal Assistance - Patient > 75%    Lower Body Dressing/Undressing Lower body dressing      What is the patient wearing?: Pants     Lower body assist Assist for lower body dressing: Total Assistance - Patient < 25%     Toileting Toileting    Toileting assist Assist for toileting: Dependent - Patient 0%     Transfers Chair/bed transfer    Transfers assist  Chair/bed transfer activity did not occur: Safety/medical concerns  Chair/bed transfer assist level: 2 Helpers     Locomotion Ambulation   Ambulation assist   Ambulation activity did not occur: Safety/medical concerns          Walk 10 feet activity   Assist  Walk 10 feet activity did not occur: Safety/medical concerns        Walk 50 feet activity   Assist Walk 50 feet with 2 turns activity did not occur: Safety/medical concerns         Walk 150 feet activity   Assist Walk 150 feet activity did not occur: Safety/medical concerns         Walk 10 feet on uneven surface  activity   Assist Walk 10 feet on uneven surfaces activity did not occur: Safety/medical concerns         Wheelchair     Assist Is the patient using a wheelchair?: Yes Type of Wheelchair: Power Wheelchair activity did not occur: Safety/medical concerns  Wheelchair assist level: Supervision/Verbal cueing Max  wheelchair distance: >300    Wheelchair 50 feet with 2 turns activity    Assist    Wheelchair 50 feet with 2 turns activity did not occur: Safety/medical concerns   Assist Level: Supervision/Verbal cueing   Wheelchair 150 feet activity     Assist  Wheelchair 150 feet activity did not occur: Safety/medical concerns   Assist Level: Supervision/Verbal cueing   Blood pressure 129/67, pulse 65, temperature 98.3 F (36.8 C), temperature source Oral, resp. rate 16, height 5' 9" (1.753 m), weight 99.9 kg, SpO2 97 %.  Medical Problem List and Plan: 1. 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  Antithrombotics: -DVT/anticoagulation:  Pharmaceutical: Lovenox.         9/13- spoke with Oncology- agree with me that needs Eliquis for a total of 3 months, even though DVT is gone, is at very high risk due to CA and new SCI.              -antiplatelet therapy: ASA 3. Pain: continue Oxycodone prn. 8/28- pt reports no significant pain- and doesn't want spasticity meds at this time. 9/15- pain controlled- con't regimen prn  4. Mood/Behavior/Sleep: LCSW to follow for evaluation and support.              -antipsychotic agents: N/A 5. Neuropsych/cognition: This patient is capable of making decisions on his own behalf. 6. Skin/Wound Care: Routine pressure relief measures              --monitor incision for healing.  7. Fluids/Electrolytes/Nutrition: Monitor I/O. Check CMET in am.  8. Prostate AdenoCA w/mets: Loading dose Firmagon 240 mg 10/06/21 per Dr. Yu --Firmagan changed to Eliqard due to insurance coverage--next dose around 09/13. --Simulation for XRT 08/31 at 2 pm at [facility] 9/6- has appt 10/11 for Eliguard and MD f/u.  9/13-  actually XRT will stop 9/20 per Oncology.  9. Dysphagia: pt now on regular/thins 10. Neurogenic bladder: continue foley for now               8/31- pt doesn't want to remove to  see if can void; or to do in/out caths- thinks too much stress on family.              9/3- still has some brownish  urine- see through. 9/7 urine generally clear. May have some periodic blood d/t anticoagulation 9/13- will check with nursing to see when Catheter/Foley needs to be changed.  9/14- due to be change 9/18- will change foley 9/18  11. Neurogenic bowel: Will get KUB to determine stool burden --continue Colace in am with suppository after supper.  8/25- had good results with bowel program 8/27- good results with bowel program yesterday 8/28- having bowel accidents- and BM with bowel program last 3 nights-  8/29- 2 small BM's last night after bowel program- con't regimen 9/2-9/3 Having results with bowel program nightly           9/5- Didn't have results last night- but had before- will con't to monitor 9/8 pt had large, timely result with fleet enema last night. Continue thru weekend and perhaps convert back to suppository Monday.  9/11 Had large BM with enema, will continue tonight, consider restart suppository tomorrow 9/12- will change back to dulcolax supp as of tonight- still need dig stim- restarted bowel program             9/13- still having bowel accidents, but less frequent             9/14- wife to learn bowel program this evening- educated that needs to do nightly to get him on regimen- takes ~ 6 weeks to train gut 9/15 BM with dig stim overnight. Per patient preference, will keep PM bowel program.  9/18 reports good results with bowel program 12. Sepsis/Likely aspiration PNA: Now on Augmentin--antibiotic D#5/10. BC pend-->neg so far.              --Follow up CXR in am.              --pulmonary hygiene for atelectasis/Asymmetric volume loss on right.  13. Abnormal LFTs: Improved overall except  for elevation in Aphos--315. Will recheck in am             8/25- Alk phos up to 457; AST 62 and ALT 96- except for alk phos, they are heading downwards- con't to monitor weekly.  14. ABLA: improving, monitor weekly   Stable at hgb 10.9   15. Leucocytosis: Recheck in am. Monitor for fevers/other signs of infection.               8/25- WBC 17.1k- on Augmentin- will recheck labs in AM             -WBC improved to 15.6 10/19/21             8/28- Down to 10.8k- con't to monitor             8/31- WBC 9.5k- doing better            9/3 - 7.9--->4.6 on 9/9, improved  -Stable at 5.1 9/18 16. Hypotension: continue midodrine.              9/11 BP has been stable, denies symptoms, continue midodrine  17. DVT left gastrocnemius vein             -8/26 Noted Lovenox was increased 8/25 to 130m BID, Distal DVT, will recheck in 1-2 weeks, compression stockings,  9/6- needs to go home on Tx lovenox- will need a minimum of 3 months due to DVT- cannot switch to Eliquis, etc since cancer related DVTs need Lovenox!             9/12- New Dopplers show no DVT- will d/w Oncology the plan since no DVT now, HOWEVER did have one and Cancer dramatically increases his risk.              9/13- put on Eliquis per Oncology for a minimum of 3 months  18. Spasticity             8/28- pt doesn't want spasticity meds- isn't painful or annoying.              9/6- Spasticity somewhat better today             9/14- educated can get worse for up to 2 years 19. Dispo             8/28- will d/c staples and see if PA can stop appointments at [facility]- only at [facility]. 8/29- need to see about Mills Koller -supposed to be given 9/12- Oncology to handle it- L/M/via PA about this.  8/30- per pt, they aren't using Firmagen- are using other product- will d/w Oncology more 9/2- XRT starts  9/12- 9/26             9/14- will finish 9/20    -Pt meets criteria for a power w/c with elevater, tilt in space, recline, elevating leg  rests features due to C6 quadriplegia- from Prostate cancer with mets.compressing Spinal cord- has LE DVT so has some labile edema- so needs to elevate legs for edema control; needs elevater to transfer from hospital bed to w/c; and to prevent pressure ulcers, needs recline and tilt in space- also needs ROHO because came ot rehab with pressure ulcer   20. Left hand PIP digit swelling: placed OT order for kinesiotaping and nursing order for ice application-improved 21. Post nasal drip             9/13- will add Flonase daily for cough related to nasal drip-             9/14- helping wishes had started "years ago".   22. Flu shot- will get tomorrow- ordered             9/15- to get this AM    LOS: 25 days A FACE TO [place] 11/11/2021, 12:54 PM

## 2021-11-11 NOTE — Progress Notes (Signed)
Bowel program completed by spouse, dig stim performed x 2 with stool return.

## 2021-11-12 ENCOUNTER — Ambulatory Visit
Admit: 2021-11-12 | Discharge: 2021-11-12 | Disposition: A | Discharge status: Home / Self Care | Payer: BC Managed Care – PPO | Attending: Radiation Oncology | Admitting: Radiation Oncology

## 2021-11-12 ENCOUNTER — Other Ambulatory Visit: Payer: Self-pay

## 2021-11-12 DIAGNOSIS — Z510 Encounter for antineoplastic radiation therapy: Secondary | ICD-10-CM | POA: Diagnosis not present

## 2021-11-12 DIAGNOSIS — I10 Essential (primary) hypertension: Secondary | ICD-10-CM | POA: Diagnosis not present

## 2021-11-12 DIAGNOSIS — E785 Hyperlipidemia, unspecified: Secondary | ICD-10-CM | POA: Diagnosis not present

## 2021-11-12 DIAGNOSIS — Z79899 Other long term (current) drug therapy: Secondary | ICD-10-CM | POA: Diagnosis not present

## 2021-11-12 DIAGNOSIS — C490 Malignant neoplasm of connective and soft tissue of head, face and neck: Secondary | ICD-10-CM | POA: Diagnosis not present

## 2021-11-12 DIAGNOSIS — C7951 Secondary malignant neoplasm of bone: Secondary | ICD-10-CM | POA: Diagnosis not present

## 2021-11-12 DIAGNOSIS — C7931 Secondary malignant neoplasm of brain: Secondary | ICD-10-CM | POA: Diagnosis not present

## 2021-11-12 DIAGNOSIS — C61 Malignant neoplasm of prostate: Secondary | ICD-10-CM | POA: Diagnosis not present

## 2021-11-12 DIAGNOSIS — I951 Orthostatic hypotension: Secondary | ICD-10-CM

## 2021-11-12 LAB — RAD ONC ARIA SESSION SUMMARY
Course Elapsed Days: 12
Plan Fractions Treated to Date: 9
Plan Prescribed Dose Per Fraction: 3 Gy
Plan Total Fractions Prescribed: 10
Plan Total Prescribed Dose: 30 Gy
Reference Point Dosage Given to Date: 27 Gy
Reference Point Session Dosage Given: 3 Gy
Session Number: 9

## 2021-11-12 NOTE — Patient Care Conference (Signed)
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 Jennye Boroughs Social Worker Present: Loralee Pacas, [place] Nurse Present: Dorthula Nettles, RN PT Present: Ailene Rud, PT OT Present: Roanna Epley, COTA;Jennifer Tamala Julian, OT     Current Status/Progress Goal Weekly Team Focus  Bowel/Bladder   Existing foley d/t urinary retention. Daily bowel program.  Verbalize importance of bowel program & foley care  Continue Family education on foley care and bowel program   Swallow/Nutrition/ Hydration             ADL's   BADLs at bed level; UB dressing min A; LB dressing-max A; rolling in bed with min A; SB tranfsers with max A; pt's wife participating in SB and hoyer transfers  mod A pt BADL's and indep for caregiver with DME  education, bed mobility, sitting balance, BADLs   Mobility   max A bed mobility and max A SBT, initiated transfer training with wife  mod A bed mobility, min A bed to chair transfers  transfers, hoyer training, SBT training   Communication             Safety/Cognition/ Behavioral Observations            Pain   Pt denies pain  Remain pain free  Assess Qshift and prn   Skin   Healed incison to back of neck. Blanchable redness to bilat heels.  No new breakdown  Assess Qshift and  prn     Discharge Planning:  D/c to home with support from wife as primary caregiver. Pt will radiation schedule changed: 9/08-9/20pm. Transportation scheduled with [facility] for p/u. Pt wife is working on remodeling bathroom (walk in shower) and ramp. [facility]- [place] for HHPT/OT/SN/[name]   Team Discussion: Prostate cancer with mets. Bowel program going well, foley still in place. Reports throat being sore, has throat spray PRN. Occasional left shoulder pain. Cervical incision CDI. Wife doing bowel program. Foley due to be changed. Patient to be discharged home with spouse. Has loaner WC, will need hoyer lift. Due to complete radiation therapy this week.  Patient on target to meet rehab goals: yes, mod assist overall. Currently transfers and bed mobility with +2 assist. Have neighbor that can assist with transfers. Slide board transfers improving.   *See Care Plan and progress notes for long and short-term goals.   Revisions to Treatment Plan:  MD placed order to replace foley.    Teaching Needs: Family education, medication/pain management, bowel/bladder management, foley care, transfer training, etc.   Current Barriers to Discharge: Decreased caregiver support, Neurogenic bowel and bladder, Lack of/limited family support, and Pending chemo/radiation  Possible Resolutions to Barriers: Family education Order recommended DME Referral for Home Health Therapy     Medical Summary Current Status: incomplete/ASIA C quadriplegia, neurogenic bladder, neurogenic bowel,prostate CA,  anemia  Barriers to Discharge: Home enviroment access/layout;Neurogenic Bowel & Bladder;Pending chemo/radiation  Barriers to Discharge Comments: incomplete/ASIA C quadriplegia, neurogenic bladder, neurogenic bowel,prostate CA Possible Resolutions to [employer] Focus: continue bowel program, continue foley, monitor CBC and Chem   Continued Need for Acute Rehabilitation Level of Care: The patient requires daily  medical management by a physician with specialized training in physical medicine and rehabilitation for the following reasons: Direction of a multidisciplinary physical rehabilitation program to maximize functional independence : Yes Medical management of patient stability for increased activity during participation in an intensive rehabilitation regime.: Yes Analysis of laboratory values and/or radiology reports with any subsequent need for medication adjustment and/or medical intervention. : Yes   I attest that I was present, lead the team conference, and concur with the assessment and plan of the team.   Cristi Loron 11/12/2021, 1:59 PM

## 2021-11-12 NOTE — Progress Notes (Signed)
Physical Therapy Session Note  Patient Details  Name: Jason [name] MRN: [id number] Date of Birth: 1955-05-22  Today's Date: 11/12/2021 PT Individual Time: 0915-1030 PT Individual Time Calculation (min): 75 min   Short Term Goals: Week 3:  PT Short Term Goal 1 (Week 3): Pt will perform SBT with max A x 1 consistently PT Short Term Goal 1 - Progress (Week 3): Met PT Short Term Goal 2 (Week 3): Pt will perform bed mobility max A x 1 PT Short Term Goal 2 - Progress (Week 3): Met PT Short Term Goal 3 (Week 3): Pt and wife will initiate hoyer lift training PT Short Term Goal 3 - Progress (Week 3): Met  Skilled Therapeutic Interventions/Progress Updates:    Pt received in [name], agreeable to therapy. No complaint of pain. Pt navigated PWC with supervision and cues for setting up transfer. Session focused on family education with [facility] on performing slideboard transfer. Pt performed with his wife and therapist w/c<>mat table from R side x 3 each direction. Pt was able to perform ~20% of transfer by pushing through his BLE at start of session but required max-tot by end of session d/t fatigue. Provided extensive education and problem solving with [facility] for safest transfer and improved body mechanics, including practice with therapist and visual demonstration. Discussed several methods to try in the next few days with plan to find best solution for continued practice. Pt remained in w/c at end of session with his wife present and needs in reach.   Therapy Documentation Precautions:  Precautions Precautions: Fall, Cervical Precaution Comments: C6 laminectomy, MAP >65 Restrictions Weight Bearing Restrictions: Yes RUE Weight Bearing: Weight bearing as tolerated General:       Therapy/Group: Individual Therapy  [name] 11/12/2021, 12:49 PM

## 2021-11-12 NOTE — Progress Notes (Signed)
Bowel program completed by wife. Dig stem competed x2 and finished by suppository. Pt tolerated well with no distress noted.

## 2021-11-12 NOTE — Progress Notes (Signed)
Occupational Therapy Session Note  Patient Details  Name: Jason [name] MRN: [id number] Date of Birth: [birth year]  Today's Date: 11/12/2021 OT Individual Time: 4098-1191 OT Individual Time Calculation (min): 51 min    Short Term Goals: Week 4:  OT Short Term Goal 1 (Week 4): Pt will perform sitting EOB/EOM with min A for RUE simple functional or reaching task OT Short Term Goal 2 (Week 4): Pt will thread BLE into pants with max A seated in half circle sitting at bed level OT Short Term Goal 3 (Week 4): Pt will perform SB transfer with max A+1  Skilled Therapeutic Interventions/Progress Updates:  Pt awake in power w/c upon OT arrival to the room. Pt reports, "That's definitely different," referring to new SB transfer method attempted. Pt in agreement for OT session.  Therapy Documentation Precautions:  Precautions Precautions: Fall, Cervical Precaution Comments: C6 laminectomy, MAP >65 Restrictions Weight Bearing Restrictions: Yes RUE Weight Bearing: Weight bearing as tolerated Vital Signs: Please see "Flowsheet" for most recent vitals charted by nursing staff.  Pain: Pain Assessment Pain Scale: 0-10 Pain Score: 0-No pain  ADL: Pt declines need to perform ADLs at this time.   Transfer Training: Pt and spouse participate in transfer training during OT session to improve spouse's comfort and independence with SB transfers and pt's comfort with caregiver support. Pt able to perform SB transfer to the R from w/c > edge of mat with 2-person assist. Pt's spouse able to perform SB transfer to the R form w/c > edge of mat with minimal VC's and spouse providing pt with maximal assist and minimal assist from OT. Once at edge of mat, pt requires maximal posterior support to maintain sitting balance secondary to fatigue. Spouse then provides assist for sitting to allow OT to provide skills demo on body mechanics and transfer techniques due to this OT being similar height of spouse to  problem solve body mechanics and positioning to provide adequate assist as well as protecting caregiver from injury. OT provides skilled demo and provided by OT performing SB transfer to the L from edge of mat > w/c with maximal assistance x 1 with pt having BUE around therapist's shoulders using elbow flexion/biceps activation for pt to maintain proper core positioning during transfer. OT provides in depth demo and education on proper body mechanics and physic aspects to utilize caregivers BLE to block the knee that pt is transferring to, bending at knees instead of back, reaching around the posterior side of pt to provide adequate assistance, and to instruct pt to put BUE on caregivers shoulders and perform sustained bicep contraction to assist with maintaining proper sitting balance during transfer. OT encourage pt's spouse to take a picture of positioning of therapist's BLE to provide adequate lateral and blocking support. OT encourage pt and caregiver to practice this positioning with spouse blocking pt's LE, having pt put arms on caregivers shoulder and using bicep activation to achieve anterior weight shifting, and using blocking of LE to also push while using gait belt around hips to re-position pt in w/c. OT provides education and skills demo to pt's PT and COTA that are pt's primary therapists to for pt and caregiver to practice with these techniques on next sessions to determine if this technique allows caregiver to provide more adequate assistance while maintaining proper body mechanics. Pt and spouse in agreement as well as pt's primary therapists. Pt able to independently manage power w/c using R joystick from room <> therapy gym with no safety concerns  noted.   Pt requested to stay in the power w/c at end of session. Pt left sitting comfortably in the power w/c with personal belongings and call light within reach, seatedbelt on, spouse present in room, and comfort needs attended to.    Therapy/Group: Individual Therapy  Jason [name] 11/12/2021, 12:43 PM

## 2021-11-12 NOTE — Progress Notes (Signed)
PROGRESS NOTE   Subjective/Complaints:  Pt in wheelchair this AM. No new concerns. Reports good results with bowel program.  LBM with bowel program 9/18     11/12/2021    6:21 AM 11/11/2021    7:42 PM 11/11/2021    3:08 PM  Vitals with BMI  Systolic [phone]  Diastolic 62 74 67  Pulse 68 78 64     ROS: Denies fevers, HA, chills, N/V, abdominal pain, constipation, diarrhea, SOB, cough, chest pain, new weakness or paraesthesias.    Objective:   No results found. Recent Labs    11/11/21 0711  WBC 5.1  HGB 10.9*  HCT 32.6*  PLT 202    Recent Labs    11/11/21 0711  NA 138  K 3.9  CL 106  CO2 27  GLUCOSE 104*  BUN 22  CREATININE 0.53*  CALCIUM 8.6*     Intake/Output Summary (Last 24 hours) at 11/12/2021 1106 Last data filed at 11/12/2021 0756 Gross per 24 hour  Intake 360 ml  Output 3000 ml  Net -2640 ml         Physical Exam: Vital Signs reviewed as above.   General: awake, alert, appropriate, sitting up in bed; NAD HENT: CN, AT, conjugate gaze; oropharynx moist CV: regular rate; no JVD Pulmonary: CTA B/L; no W/R/R- good air movement GI: soft, NT, ND, (+)BS Psychiatric: appropriate, pleasant Neurological: Ox3 Genitourinary:Dark yellow urine in bag   Musculoskeletal: L shoulder K tape in place- shoulder still subluxed ~1"    Cervical back: Neck supple.   Moving all 4 extremities today; prior exams:  RUE- biceps 5/5; WE 4-/5; Triceps 2+/5; Grip 3-/5; FA 2-/5 LUE- biceps 5-/5; WE 4/5; Triceps 2+/5' Grip 3+/5; FA 2-/5 RLE- 1to 1+/5 HE, KE and trace at anke LLE- HF 1+/5; KE -/5; DF 1/5; PF 2/5; EHL 1/5   Decreased to light touch from L1 inferiorly; note sacrals not tested RLE spasms decreased; none in LLE       Assessment/Plan: 1. Functional deficits which require 3+ hours per day of interdisciplinary therapy in a comprehensive inpatient rehab setting. Physiatrist is providing close  team supervision and 24 hour management of active medical problems listed below. Physiatrist and rehab team continue to assess barriers to discharge/monitor patient progress toward functional and medical goals  Care Tool:  Bathing    Body parts bathed by patient: Face   Body parts bathed by helper: Right arm, Left arm, Chest, Abdomen, Front perineal area, Buttocks, Left lower leg, Right lower leg, Left upper leg, Right upper leg     Bathing assist Assist Level: Dependent - Patient 0%     Upper Body Dressing/Undressing Upper body dressing   What is the patient wearing?: Pull over shirt    Upper body assist Assist Level: Minimal Assistance - Patient > 75%    Lower Body Dressing/Undressing Lower body dressing      What is the patient wearing?: Pants     Lower body assist Assist for lower body dressing: Total Assistance - Patient < 25%     Toileting Toileting    Toileting assist Assist for toileting: Dependent - Patient 0%  Transfers Chair/bed transfer  Transfers assist  Chair/bed transfer activity did not occur: Safety/medical concerns  Chair/bed transfer assist level: 2 Helpers     Locomotion Ambulation   Ambulation assist   Ambulation activity did not occur: Safety/medical concerns          Walk 10 feet activity   Assist  Walk 10 feet activity did not occur: Safety/medical concerns        Walk 50 feet activity   Assist Walk 50 feet with 2 turns activity did not occur: Safety/medical concerns         Walk 150 feet activity   Assist Walk 150 feet activity did not occur: Safety/medical concerns         Walk 10 feet on uneven surface  activity   Assist Walk 10 feet on uneven surfaces activity did not occur: Safety/medical concerns         Wheelchair     Assist Is the patient using a wheelchair?: Yes Type of Wheelchair: Power Wheelchair activity did not occur: Safety/medical concerns  Wheelchair assist level:  Supervision/Verbal cueing Max wheelchair distance: >300    Wheelchair 50 feet with 2 turns activity    Assist    Wheelchair 50 feet with 2 turns activity did not occur: Safety/medical concerns   Assist Level: Supervision/Verbal cueing   Wheelchair 150 feet activity     Assist  Wheelchair 150 feet activity did not occur: Safety/medical concerns   Assist Level: Supervision/Verbal cueing   Blood pressure 124/62, pulse 68, temperature 98.6 F (37 C), temperature source Oral, resp. rate 16, height '5\' 9"'  (1.753 m), weight 99.9 kg, SpO2 97 %.  Medical Problem List and Plan: 1. 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  Antithrombotics: -DVT/anticoagulation:  Pharmaceutical: Lovenox.         9/13- spoke with Oncology- agree with me that needs Eliquis for a total of 3 months, even though DVT is gone, is at very high risk due to CA and new SCI.  -Team conference today             -antiplatelet therapy: ASA 3. Pain: continue Oxycodone prn. 8/28- pt reports no significant pain- and doesn't want spasticity meds at this time. 9/15- pain controlled- con't regimen prn  4. Mood/Behavior/Sleep: LCSW to follow for evaluation and support.              -antipsychotic agents: N/A 5. Neuropsych/cognition: This patient is capable of making decisions on his own behalf. 6. Skin/Wound Care: Routine pressure relief measures              --monitor incision for healing.  7. Fluids/Electrolytes/Nutrition: Monitor I/O. Check CMET in am.  8. Prostate AdenoCA w/mets: Loading dose Firmagon 240 mg 10/06/21 per Dr. Tasia Catchings --Charlesetta Garibaldi changed to [facility] due to insurance coverage--next dose around 09/13. --Simulation for XRT 08/31 at 2 pm at  [facility] 9/6- has appt 10/11 for Eliguard and MD f/u.  9/13- actually XRT will stop 9/20 per Oncology.  9. Dysphagia: pt now on regular/thins 10. Neurogenic bladder: continue foley for now  8/31- pt doesn't want to remove to  see if can void; or to do in/out caths- thinks too much stress on family.              9/3- still has some brownish  urine- see through. 9/7 urine generally clear. May have some periodic blood d/t anticoagulation 9/13- will check with nursing to see when Catheter/Foley needs to be changed.  9/14- due to be change 9/18- will change foley 9/18 Continue foley  11. Neurogenic bowel: Will get KUB to determine stool burden --continue Colace in am with suppository after supper.  8/25- had good results with bowel program 8/27- good results with bowel program yesterday 8/28- having bowel accidents- and BM with bowel program last 3 nights-  8/29- 2 small BM's last night after bowel program- con't regimen 9/2-9/3 Having results with bowel program nightly           9/5- Didn't have results last night- but had before- will con't to monitor 9/8 pt had large, timely result with fleet enema last night. Continue thru weekend and perhaps convert back to suppository Monday.  9/11 Had large BM with enema, will continue tonight, consider restart suppository tomorrow 9/12- will change back to dulcolax supp as of tonight- still need dig stim- restarted bowel program             9/13- still having bowel accidents, but less frequent             9/14- wife to learn bowel program this evening- educated that needs to do nightly to get him on regimen- takes ~ 6 weeks to train gut 9/15 BM with dig stim overnight. Per patient preference, will keep PM bowel program.  9/19 continues to report good results with bowel program, LBM 9/18 12. Sepsis/Likely aspiration PNA: Now on Augmentin--antibiotic D#5/10. BC pend-->neg so far.              --Follow up CXR in am.              --pulmonary hygiene  for atelectasis/Asymmetric volume loss on right.  13. Abnormal LFTs: Improved overall except for elevation in [place]--315. Will recheck in am             8/25- Alk phos up to 457; AST 62 and ALT 96- except for alk phos, they are heading downwards- con't to monitor weekly.  14. ABLA: improving, monitor weekly   Stable at hgb 10.9 on 9/18   15. Leucocytosis: Recheck in am. Monitor for fevers/other signs of infection.               8/25- WBC 17.1k- on Augmentin- will recheck labs in AM             -WBC improved to 15.6 10/19/21             8/28- Down to 10.8k- con't to monitor             8/31- WBC 9.5k- doing better            9/3 - 7.9--->4.6 on 9/9, improved  -Stable at 5.1 9/18 16. Hypotension: continue midodrine.              9/11 BP has been stable, denies symptoms, continue midodrine   9/19 BP a little higher than previously, continue to monitor, consider stopping midodrine 17. DVT left gastrocnemius vein             -8/26 Noted Lovenox was increased 8/25 to 166m  BID, Distal DVT, will recheck in 1-2 weeks, compression stockings,              9/6- needs to go home on Tx lovenox- will need a minimum of 3 months due to DVT- cannot switch to Eliquis, etc since cancer related DVTs need Lovenox!             9/12- New Dopplers show no DVT- will d/w Oncology the plan since no DVT now, HOWEVER did have one and Cancer dramatically increases his risk.              9/13- put on Eliquis per Oncology for a minimum of 3 months  18. Spasticity             8/28- pt doesn't want spasticity meds- isn't painful or annoying.              9/6- Spasticity somewhat better today             9/14- educated can get worse for up to 2 years 19. Dispo             8/28- will d/c staples and see if PA can stop appointments at [facility]- only at [facility]. 8/29- need to see about Mills Koller -supposed to be given 9/12- Oncology to handle it- L/M/via PA about this.  8/30- per pt, they aren't using Firmagen- are using  other product- will d/w Oncology more 9/2- XRT starts  9/12- 9/26             9/14- will finish 9/20    -Pt meets criteria for a power w/c with elevater, tilt in space, recline, elevating leg rests features due to C6 quadriplegia- from Prostate cancer with mets.compressing Spinal cord- has LE DVT so has some labile edema- so needs to elevate legs for edema control; needs elevater to transfer from hospital bed to w/c; and to prevent pressure ulcers, needs recline and tilt in space- also needs ROHO because came ot rehab with pressure ulcer   20. Left hand PIP digit swelling: placed OT order for kinesiotaping and nursing order for ice application-improved 21. Post nasal drip             9/13- will add Flonase daily for cough related to nasal drip-             9/14- helping wishes had started "years ago".   22. Flu shot- will get tomorrow- ordered             9/15- to get this AM    LOS: 26 days A FACE TO [place] 11/12/2021, 11:06 AM

## 2021-11-12 NOTE — Progress Notes (Signed)
Physical Therapy Session Note  Patient Details  Name: Jason [name] MRN: [id number] Date of Birth: 25-Jun-1955  Today's Date: 11/12/2021 PT Individual Time: 7893-8101 PT Individual Time Calculation (min): 38 min   Short Term Goals: Week 4:  PT Short Term Goal 1 (Week 4): =LTGs d/t ELOS  Skilled Therapeutic Interventions/Progress Updates:      Therapy Documentation Precautions:  Precautions Precautions: Fall, Cervical Precaution Comments: C6 laminectomy, MAP >65 Restrictions Weight Bearing Restrictions: Yes RUE Weight Bearing: Weight bearing as tolerated  Pt received seated in w/c at bedside with spouse present. Pt and spouse agreeable to work on [employer] transfer with patient utilizing biceps to support his UE's around his spouse for transfer. Pt with increased fatigue during session and presented with significant posterior pelvic tilt and sacral sitting resulting in patient sliding forward in w/c during spouse's attempt of slide board placement and required +2 for repositioning in [place]. Pt also unable to maintain right lateral lean due to increased fatigue for slide board placement. Pt spouse reports difficulty with placement of slide board with standard Roho cushion and prefers Roho hybrid cushion for transfer training and cushion switched for next session. PT positioned slide board and performed total A slide board transfer from [facility] to bed for time management. Pt required max A x 1 for sit to lying. Pt left semi-reclined in bed with alarm on and all needs within reach.   Therapy/Group: Individual Therapy  Verl Dicker Verl Dicker PT, DPT  11/12/2021, 7:47 AM

## 2021-11-12 NOTE — Progress Notes (Signed)
Patient ID: Jason [name], male   DOB: 1956-01-11, 66 y.o.   MRN: [id number]  SW met with pt and pt wife in room to provide updates from team conference, and d/c date remains 9/26. Pt and wife discussed concerns related to [facility] not being on time and almost missing their appointments. SW informed will discuss with [facility]. Pt wife reports ramp and bathroom are being worked on at this time. SW reminded that HHA remains in place. Pt wife confirms that DME: hospital bed and hoyer will be delivered on 9/25, as well as loaner w/c.  Loralee Pacas, MSW, [place] Office: [phone] Cell: [phone] Fax: [phone]

## 2021-11-12 NOTE — Progress Notes (Signed)
Occupational Therapy Session Note  Patient Details  Name: Jason [name] MRN: [id number] Date of Birth: 1955/03/02  Today's Date: 11/12/2021 OT Individual Time: 0800-0900 OT Individual Time Calculation (min): 60 min    Short Term Goals: Week 4:  OT Short Term Goal 1 (Week 4): Pt will perform sitting EOB/EOM with min A for RUE simple functional or reaching task OT Short Term Goal 2 (Week 4): Pt will thread BLE into pants with max A seated in half circle sitting at bed level OT Short Term Goal 3 (Week 4): Pt will perform SB transfer with max A+1  Skilled Therapeutic Interventions/Progress Updates:    OT intervention with focus on bed mobility, long sitting in bed, UB dressing, sitting balance EOB, SB tranfsers, family education, and grooming to increase indepenene with BADLs and decrease burden of care. With [facility] elevated pt able to come into long sitting with BUE assist and maintain with min A while pt pulled shirt over back and head. Pt required min A to don pullover shirt using dressing stick. Pt also used dressing stick to initally pull pants over knees and to hips. Rolling in bed with min A after LE positioned. Pt's wife completed supine>sit and SB transfer to [facility]. OTA provided assistance for sitting balance when positining SB. SB transfer with OTA assisting wife. OTA provided mod A with wife providing max A. Pt's wife assisted with positioning in [place]. Grooming at sink. Pt remained in [place] with wife present.   Therapy Documentation Precautions:  Precautions Precautions: Fall, Cervical Precaution Comments: C6 laminectomy, MAP >65 Restrictions Weight Bearing Restrictions: Yes RUE Weight Bearing: Weight bearing as tolerated Pain: Pain Assessment Pain Scale: 0-10 Pain Score: 0-No pain   Therapy/Group: Individual Therapy  Leroy Libman 11/12/2021, 9:01 AM

## 2021-11-13 ENCOUNTER — Encounter: Payer: Self-pay | Admitting: Urology

## 2021-11-13 ENCOUNTER — Ambulatory Visit: Payer: BC Managed Care – PPO

## 2021-11-13 ENCOUNTER — Other Ambulatory Visit: Payer: Self-pay

## 2021-11-13 ENCOUNTER — Ambulatory Visit
Admit: 2021-11-13 | Discharge: 2021-11-13 | Disposition: A | Discharge status: Home / Self Care | Payer: BC Managed Care – PPO | Attending: Radiation Oncology | Admitting: Radiation Oncology

## 2021-11-13 DIAGNOSIS — Z510 Encounter for antineoplastic radiation therapy: Secondary | ICD-10-CM | POA: Diagnosis not present

## 2021-11-13 DIAGNOSIS — C7951 Secondary malignant neoplasm of bone: Secondary | ICD-10-CM | POA: Diagnosis not present

## 2021-11-13 DIAGNOSIS — I10 Essential (primary) hypertension: Secondary | ICD-10-CM | POA: Diagnosis not present

## 2021-11-13 DIAGNOSIS — Z79899 Other long term (current) drug therapy: Secondary | ICD-10-CM | POA: Diagnosis not present

## 2021-11-13 DIAGNOSIS — C61 Malignant neoplasm of prostate: Secondary | ICD-10-CM | POA: Diagnosis not present

## 2021-11-13 DIAGNOSIS — E785 Hyperlipidemia, unspecified: Secondary | ICD-10-CM | POA: Diagnosis not present

## 2021-11-13 DIAGNOSIS — C7931 Secondary malignant neoplasm of brain: Secondary | ICD-10-CM | POA: Diagnosis not present

## 2021-11-13 DIAGNOSIS — C490 Malignant neoplasm of connective and soft tissue of head, face and neck: Secondary | ICD-10-CM | POA: Diagnosis not present

## 2021-11-13 LAB — RAD ONC ARIA SESSION SUMMARY
Course Elapsed Days: 13
Plan Fractions Treated to Date: 10
Plan Prescribed Dose Per Fraction: 3 Gy
Plan Total Fractions Prescribed: 10
Plan Total Prescribed Dose: 30 Gy
Reference Point Dosage Given to Date: 30 Gy
Reference Point Session Dosage Given: 3 Gy
Session Number: 10

## 2021-11-13 NOTE — Progress Notes (Signed)
Occupational Therapy Session Note  Patient Details  Name: Jason [name] MRN: [id number] Date of Birth: 25-Sep-1955  Today's Date: 11/13/2021 OT Individual Time: 6333-5456 OT Individual Time Calculation (min): 70 min    Short Term Goals: Week 4:  OT Short Term Goal 1 (Week 4): Pt will perform sitting EOB/EOM with min A for RUE simple functional or reaching task OT Short Term Goal 2 (Week 4): Pt will thread BLE into pants with max A seated in half circle sitting at bed level OT Short Term Goal 3 (Week 4): Pt will perform SB transfer with max A+1  Skilled Therapeutic Interventions/Progress Updates:    Pt resting in bed upon arrival. Pt reports he "feels tired" this morning. Pt reports he practiced many SB transfers previous day. OT intervention with focus on bed mobility, sitting balance UB/LB dressing tasks at bed level, and SB tranfser with wife. Pt with more difficulty positioning into unsupported sitting this morning, atributed to increased fatigue. OTA provided mod A for sitting balance while pt doffed pullover shirt. Pt donned pullover shirt with min A. Pt used dressing stick to initially pull pants over BLE. Bridging in bed to facilitate wife and OTA to pull pants up to hips. Rolling in bed with min A for wife to pull pants over hips. Supine>sit EOB with wife assisting. OTA provided assistance with sitting balance while wife positioned SB. Wife assisted with SB transfer. OTA provided assistance and secured SB. Educated pt and wife on importance of having additional person to assist with all transfers. Pt remained seated in PWC at sink. Wife present.   Therapy Documentation Precautions:  Precautions Precautions: Fall, Cervical Precaution Comments: C6 laminectomy, MAP >65 Restrictions Weight Bearing Restrictions: Yes RUE Weight Bearing: Weight bearing as tolerated Pain:  Pt denies pain this morning     Therapy/Group: Individual Therapy  Leroy Libman 11/13/2021, 9:01  AM

## 2021-11-13 NOTE — Progress Notes (Signed)
Physical Therapy Session Note  Patient Details  Name: Jason [name] MRN: [id number] Date of Birth: 1955/10/04  Today's Date: 11/13/2021 PT Individual Time: 1020-1205 PT Individual Time Calculation (min): 105 min   Short Term Goals: Week 4:  PT Short Term Goal 1 (Week 4): =LTGs d/t ELOS  Skilled Therapeutic Interventions/Progress Updates: Pt presented in [place] with wife [name] present. PTA, pt and wife with extensive discussion regarding hands on training and equipment management. PTA also discussed with Auria, LSW regarding if possible to obtain sling prior to d/c however unable to. PTA also discussed with pt and wife best options for transfer with wife wishing to continue practicing hoyer transfers. PTA provided continued education regarding on when to use hoyer vs slideboard. Wife wishes to continue practicing hoyer training with understanding that depending on fatigue level and situation may dictate which transfer is best. Remaining session then focused on hands on practice of hoyer transfers from [facility] to/from bed. PTA instructed [name] initially on hoyer placement while in [place] then had wife perform with PTA acting as second person for hands on assist. [name] was able to transfer from [facility] to bed via hoyer with minA from PTA and increased time to allow HER to problem solve. PTA then had pt figure out how to remove hoyer pad (with min cues from PTA), then had repeat process from bed. PTA providing gentle encouragement and intermittent cues for best body mechanics and improved technique, but attempted to have wife recall process as best as possible. This did require increased time on her part however she was able to figure out with overall good amd safe technique. Wife was able to repeat process to place pad and have pt transfer back to [facility] with PTA acting as +2 for sling management only. Wife was able to remove hoyer sling with supervision and demonstrated fair body mechanics. L:astly wife was able to  place hoyer sling back in place with pt in [address] with min cues from PTA but increased time. Wife was able to place sling correctly and lift pt up enough to clear PWC. Wife felt more encouraged after this session. Pt left in [place] at end of session with lunch set up, and wife present with current needs met.      Therapy Documentation Precautions:  Precautions Precautions: Fall, Cervical Precaution Comments: C6 laminectomy, MAP >65 Restrictions Weight Bearing Restrictions: Yes RUE Weight Bearing: Weight bearing as tolerated General:   Vital Signs:   Pain:   Mobility:   Locomotion :    Trunk/Postural Assessment :    Balance:   Exercises:   Other Treatments:      Therapy/Group: Individual Therapy  Talea Manges 11/13/2021, 12:55 PM

## 2021-11-13 NOTE — Progress Notes (Signed)
Patient chg bath along with foley care completed with wife. Educated wife on how to perform foley care at home. Bowel program also completed with wife, patient tolerated well. Dig stim performed per protocol, followed by suppository. After 15-20 mins, dig stim completed again by wife as this nurse stood nearby to assist. Patient so far, had a small bm. Will inform next shift to monitor for another bm.

## 2021-11-13 NOTE — Progress Notes (Signed)
Occupational Therapy Session Note  Patient Details  Name: Jason [name] MRN: [id number] Date of Birth: 20-Nov-1955  Today's Date: 11/13/2021 OT Individual Time: 1300-1329 OT Individual Time Calculation (min): 29 min    Short Term Goals: Week 4:  OT Short Term Goal 1 (Week 4): Pt will perform sitting EOB/EOM with min A for RUE simple functional or reaching task OT Short Term Goal 2 (Week 4): Pt will thread BLE into pants with max A seated in half circle sitting at bed level OT Short Term Goal 3 (Week 4): Pt will perform SB transfer with max A+1  Skilled Therapeutic Interventions/Progress Updates:    Pt resting in Jason [name] upon arrival. OT intervention with focus on transfer education/practice. Pt and wife opted to use SB for transfer to bed. SB placement challenging 2/2 Roho cushion and OTA intervened to complete transfer for safety. SB transfers more challenging that [name]. Pt and wife in agreement that Jason [name] transfers will be best option when at home. Pt repositioned in bed awaiting transport for radiation therapy. Wife present.   Therapy Documentation Precautions:  Precautions Precautions: Fall, Cervical Precaution Comments: C6 laminectomy, MAP >65 Restrictions Weight Bearing Restrictions: Yes RUE Weight Bearing: Weight bearing as tolerated General:   Vital Signs:   Pain: Pain Assessment Pain Scale: 0-10 Pain Score: 0-No pain  Therapy/Group: Individual Therapy  Leroy Libman 11/13/2021, 1:36 PM

## 2021-11-13 NOTE — Progress Notes (Addendum)
Patient ID: Jason [name], male   DOB: Feb 01, 1956, 66 y.o.   MRN: [id number]  SW ordered transfer board with [place] via parachute.   SW scheduled ambulance transport with [facility] for pick up on 9/26 at [place], MSW, [place] Office: [phone] Cell: [phone] Fax: [phone]

## 2021-11-13 NOTE — Progress Notes (Signed)
PROGRESS NOTE   Subjective/Complaints:  Sitting in [place] today. Foley replaced yesterday. Some sediment noted in foley. No new concerns or complaints.   LBM 9/20     11/13/2021    5:04 AM 11/12/2021    8:40 PM 11/12/2021   12:57 PM  Vitals with BMI  Systolic 111 735 670  Diastolic 74 64 77  Pulse 65 68 64     ROS: Denies fevers,chills, N/V, abdominal pain, constipation, diarrhea, SOB, cough, abdominal pain, chest pain, new weakness or paraesthesias.    Objective:   No results found. Recent Labs    11/11/21 0711  WBC 5.1  HGB 10.9*  HCT 32.6*  PLT 202    Recent Labs    11/11/21 0711  NA 138  K 3.9  CL 106  CO2 27  GLUCOSE 104*  BUN 22  CREATININE 0.53*  CALCIUM 8.6*     Intake/Output Summary (Last 24 hours) at 11/13/2021 0819 Last data filed at 11/13/2021 0540 Gross per 24 hour  Intake 480 ml  Output 2475 ml  Net -1995 ml         Physical Exam: Vital Signs reviewed as above.   General: awake, alert, appropriate, sitting up in WC ; NAD HENT: CN, AT, conjugate gaze; oropharynx moist CV: regular rate; no JVD Pulmonary: CTA B/L; no W/R/R- good air movement GI: soft, NT, ND, (+)BS Psychiatric: appropriate, pleasant Neurological: Ox3, follows commands Genitourinary:Dark yellow urine in bag   Musculoskeletal: L shoulder K tape in place- shoulder still subluxed ~1"    Cervical back: Neck supple.   Moving all 4 extremities today; prior exams:  RUE- biceps 5/5; WE 4-/5; Triceps 2+/5; Grip 3-/5; FA 2-/5 LUE- biceps 5-/5; WE 4/5; Triceps 2+/5' Grip 3+/5; FA 2-/5 RLE- 1to 1+/5 HE, KE and trace at anke LLE- HF 1+/5; KE -/5; DF 1/5; PF 2/5; EHL 1/5   Decreased to light touch from L1 inferiorly; note sacrals not tested RLE spasms decreased; none in LLE       Assessment/Plan: 1. Functional deficits which require 3+ hours per day of interdisciplinary therapy in a comprehensive inpatient rehab  setting. Physiatrist is providing close team supervision and 24 hour management of active medical problems listed below. Physiatrist and rehab team continue to assess barriers to discharge/monitor patient progress toward functional and medical goals  Care Tool:  Bathing    Body parts bathed by patient: Face   Body parts bathed by helper: Right arm, Left arm, Chest, Abdomen, Front perineal area, Buttocks, Left lower leg, Right lower leg, Left upper leg, Right upper leg     Bathing assist Assist Level: Dependent - Patient 0%     Upper Body Dressing/Undressing Upper body dressing   What is the patient wearing?: Pull over shirt    Upper body assist Assist Level: Minimal Assistance - Patient > 75%    Lower Body Dressing/Undressing Lower body dressing      What is the patient wearing?: Pants     Lower body assist Assist for lower body dressing: Total Assistance - Patient < 25%     Toileting Toileting    Toileting assist Assist for toileting: Dependent - Patient 0%  Transfers Chair/bed transfer  Transfers assist  Chair/bed transfer activity did not occur: Safety/medical concerns  Chair/bed transfer assist level: 2 Helpers     Locomotion Ambulation   Ambulation assist   Ambulation activity did not occur: Safety/medical concerns          Walk 10 feet activity   Assist  Walk 10 feet activity did not occur: Safety/medical concerns        Walk 50 feet activity   Assist Walk 50 feet with 2 turns activity did not occur: Safety/medical concerns         Walk 150 feet activity   Assist Walk 150 feet activity did not occur: Safety/medical concerns         Walk 10 feet on uneven surface  activity   Assist Walk 10 feet on uneven surfaces activity did not occur: Safety/medical concerns         Wheelchair     Assist Is the patient using a wheelchair?: Yes Type of Wheelchair: Power Wheelchair activity did not occur: Safety/medical  concerns  Wheelchair assist level: Supervision/Verbal cueing Max wheelchair distance: >300    Wheelchair 50 feet with 2 turns activity    Assist    Wheelchair 50 feet with 2 turns activity did not occur: Safety/medical concerns   Assist Level: Supervision/Verbal cueing   Wheelchair 150 feet activity     Assist  Wheelchair 150 feet activity did not occur: Safety/medical concerns   Assist Level: Supervision/Verbal cueing   Blood pressure 135/74, pulse 65, temperature 98.4 F (36.9 C), resp. rate 14, height '5\' 9"'  (1.753 m), weight 99.9 kg, SpO2 97 %.  Medical Problem List and Plan: 1. 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  Antithrombotics: -DVT/anticoagulation:  Pharmaceutical: Lovenox.         9/13- spoke with Oncology- agree with me that needs Eliquis for a total of 3 months, even though DVT is gone, is at very high risk due to CA and new SCI.              -antiplatelet therapy: ASA 3. Pain: continue Oxycodone prn. 8/28- pt reports no significant pain- and doesn't want spasticity meds at this time. 9/15- pain controlled- con't regimen prn  4. Mood/Behavior/Sleep: LCSW to follow for evaluation and support.              -antipsychotic agents: N/A 5. Neuropsych/cognition: This patient is capable of making decisions on his own behalf. 6. Skin/Wound Care: Routine pressure relief measures              --monitor incision for healing.  7. Fluids/Electrolytes/Nutrition: Monitor I/O. Check CMET in am.  8. Prostate AdenoCA w/mets: Loading dose Firmagon 240 mg 10/06/21 per Dr. Tasia Catchings --Charlesetta Garibaldi changed to [facility] due to insurance coverage--next dose around 09/13. --Simulation for XRT 08/31 at 2 pm at [facility] 9/6-  has appt 10/11 for Eliguard and MD f/u.  9/13- actually XRT will stop 9/20 per Oncology.  9. Dysphagia: pt now on regular/thins 10. Neurogenic bladder: continue foley for now  8/31- pt doesn't want to remove to  see if can void; or to do in/out caths- thinks too much stress on family.              9/3- still has some brownish  urine- see through. 9/7 urine generally clear. May have some periodic blood d/t anticoagulation 9/20 foley replaced yesterday  11. Neurogenic bowel: Will get KUB to determine stool burden --continue Colace in am with suppository after supper.  8/25- had good results with bowel program 8/27- good results with bowel program yesterday 8/28- having bowel accidents- and BM with bowel program last 3 nights-  8/29- 2 small BM's last night after bowel program- con't regimen 9/2-9/3 Having results with bowel program nightly           9/5- Didn't have results last night- but had before- will con't to monitor 9/8 pt had large, timely result with fleet enema last night. Continue thru weekend and perhaps convert back to suppository Monday.  9/11 Had large BM with enema, will continue tonight, consider restart suppository tomorrow 9/12- will change back to dulcolax supp as of tonight- still need dig stim- restarted bowel program             9/13- still having bowel accidents, but less frequent             9/14- wife to learn bowel program this evening- educated that needs to do nightly to get him on regimen- takes ~ 6 weeks to train gut 9/15 BM with dig stim overnight. Per patient preference, will keep PM bowel program.  9/20 large BM yesterday and small BM this AM 12. Sepsis/Likely aspiration PNA: Now on Augmentin--antibiotic D#5/10. BC pend-->neg so far.              --Follow up CXR in am.              --pulmonary hygiene for atelectasis/Asymmetric volume loss on right.  13. Abnormal LFTs: Improved overall except for elevation in Aphos--315. Will recheck in am              8/25- Alk phos up to 457; AST 62 and ALT 96- except for alk phos, they are heading downwards- con't to monitor weekly.  14. ABLA: improving, monitor weekly   Stable at hgb 10.9 on 9/18   15. Leucocytosis: Recheck in am. Monitor for fevers/other signs of infection.               8/25- WBC 17.1k- on Augmentin- will recheck labs in AM             -WBC improved to 15.6 10/19/21             8/28- Down to 10.8k- con't to monitor             8/31- WBC 9.5k- doing better            9/3 - 7.9--->4.6 on 9/9, improved  -Stable at 5.1 9/18 16. Hypotension: continue midodrine.              9/11 BP has been stable, denies symptoms, continue midodrine   9/20 Well controlled today, was a mildly elevated yesterday,continue midodrine, monitor 17. DVT left gastrocnemius vein             -8/26 Noted Lovenox was increased 8/25 to 151m BID, Distal DVT, will recheck in 1-2 weeks, compression stockings,              9/6- needs to  go home on Tx lovenox- will need a minimum of 3 months due to DVT- cannot switch to Eliquis, etc since cancer related DVTs need Lovenox!             9/12- New Dopplers show no DVT- will d/w Oncology the plan since no DVT now, HOWEVER did have one and Cancer dramatically increases his risk.              9/13- put on Eliquis per Oncology for a minimum of 3 months  18. Spasticity             8/28- pt doesn't want spasticity meds- isn't painful or annoying.              9/6- Spasticity somewhat better today             9/14- educated can get worse for up to 2 years 19. Dispo             8/28- will d/c staples and see if PA can stop appointments at [facility]- only at [facility]. 8/29- need to see about [place] Koller -supposed to be given 9/12- Oncology to handle it- L/M/via PA about this.  8/30- per pt, they aren't using Firmagen- are using other product- will d/w Oncology more 9/2- XRT starts  9/12- 9/26             9/14- will finish 9/20    -Pt meets criteria for a power w/c with  elevater, tilt in space, recline, elevating leg rests features due to C6 quadriplegia- from Prostate cancer with mets.compressing Spinal cord- has LE DVT so has some labile edema- so needs to elevate legs for edema control; needs elevater to transfer from hospital bed to w/c; and to prevent pressure ulcers, needs recline and tilt in space- also needs ROHO because came ot rehab with pressure ulcer   20. Left hand PIP digit swelling: placed OT order for kinesiotaping and nursing order for ice application-improved 21. Post nasal drip             9/13- will add Flonase daily for cough related to nasal drip-             9/14- helping wishes had started "years ago".   22. Flu shot- will get tomorrow- ordered             9/15- to get this AM    LOS: 27 days A FACE TO [place] 11/13/2021, 8:19 AM

## 2021-11-14 ENCOUNTER — Ambulatory Visit: Payer: BC Managed Care – PPO

## 2021-11-14 LAB — CBC
HCT: 35 % — ABNORMAL LOW (ref 39.0–52.0)
Hemoglobin: 11.3 g/dL — ABNORMAL LOW (ref 13.0–17.0)
MCH: 31.7 pg (ref 26.0–34.0)
MCHC: 32.3 g/dL (ref 30.0–36.0)
MCV: 98.3 fL (ref 80.0–100.0)
Platelets: 219 10*3/uL (ref 150–400)
RBC: 3.56 MIL/uL — ABNORMAL LOW (ref 4.22–5.81)
RDW: 15.9 % — ABNORMAL HIGH (ref 11.5–15.5)
WBC: 5.3 10*3/uL (ref 4.0–10.5)
nRBC: 0 % (ref 0.0–0.2)

## 2021-11-14 NOTE — Progress Notes (Signed)
Physical Therapy Session Note  Patient Details  Name: Jason [name] MRN: [id number] Date of Birth: Jun 03, 1955  Today's Date: 11/14/2021 PT Individual Time: 0955-1100 PT Individual Time Calculation (min): 65 min   Short Term Goals: Week 4:  PT Short Term Goal 1 (Week 4): =LTGs d/t ELOS  Skilled Therapeutic Interventions/Progress Updates:    pt received in bed and agreeable to therapy. No complaint of pain. Session focused on bed mobility and transfers. Pt performed supine<>sit through L sidelying x 2 with max A from therapist. Had pt direct care as if directing someone new. Pt was able to direct with cueing and reminders of where therapist is placing hands to assist. On second attempt, pt was able to roll to L with cues and supervision with bed rail!  Extensive time for cueing and discussing steps to allow pt to direct care independently. Pt then performed slideboard transfer with max A to [place]. Pt was able to direct his care, preferring to place arms on board with this therapist instead of around shoulders. Discussed transfer and allowed pt to problem solve. At this time pt drove chair to day room and participate in crunches from slightly reclined chair to propped on knees. Required CGA-min A throughout for sitting balance. Pt able to push off knees for tricep strengthening with active assist throughout, using breath for improved abdominal control. Pt returned to room and remained in chair with his wife present.   Therapy Documentation Precautions:  Precautions Precautions: Fall, Cervical Precaution Comments: C6 laminectomy, MAP >65 Restrictions Weight Bearing Restrictions: No RUE Weight Bearing: Weight bearing as tolerated General:       Therapy/Group: Individual Therapy  [name] 11/14/2021, 12:25 PM

## 2021-11-14 NOTE — Progress Notes (Signed)
Occupational Therapy Session Note  Patient Details  Name: Jason [name] MRN: [id number] Date of Birth: 06/05/1955  Today's Date: 11/14/2021 OT Individual Time: 0800-0930 OT Individual Time Calculation (min): 90 min    Short Term Goals: Week 4:  OT Short Term Goal 1 (Week 4): Pt will perform sitting EOB/EOM with min A for RUE simple functional or reaching task OT Short Term Goal 2 (Week 4): Pt will thread BLE into pants with max A seated in half circle sitting at bed level OT Short Term Goal 3 (Week 4): Pt will perform SB transfer with max A+1  Skilled Therapeutic Interventions/Progress Updates:    OT intervention with focus on bathing at shower level (rolling shower chair), education with wife for hoyer tranfsers, bed mobility, bathing, and safety awareness. Pt completed bathing at shower level with mod A. [name] transfers with wife completing and directing OTA to assist. Min verbal cues for technique/safety. Recommended to not attempt shower at home until practicing with HHOT/HHPT. Wife verbalized understanding. Pt remained in bed with wife assisting pt with dressing tasks.   Therapy Documentation Precautions:  Precautions Precautions: Fall, Cervical Precaution Comments: C6 laminectomy, MAP >65 Restrictions Weight Bearing Restrictions: No RUE Weight Bearing: Weight bearing as tolerated   Pain: Pain Assessment Pain Scale: 0-10 Pain Score: 0-No pain   Therapy/Group: Individual Therapy  Leroy Libman 11/14/2021, 9:34 AM

## 2021-11-14 NOTE — Progress Notes (Signed)
Physical Therapy Session Note  Patient Details  Name: Jason [name] MRN: [id number] Date of Birth: 06/27/1955  Today's Date: 11/14/2021 PT Individual Time: 1405-1510 PT Individual Time Calculation (min): 65 min   Short Term Goals: Week 4:  PT Short Term Goal 1 (Week 4): =LTGs d/t ELOS  Skilled Therapeutic Interventions/Progress Updates: Pt presented in [place] with wife [name] and Lattie Haw, [place] present agreeable to therapy. Pt denies pain. Lattie Haw leaving shortly after PTA arrival. Pt agreeable to have session focus on PWC mobility in community setting. Pt navigated PWC through unit and into elevator. Pt did require increased time to safely navigate into elevator. Pt then navigated through atrium to [facility] entrance and patio. Pt was able to safely navigate through patio, around fountain, up and down ramp with min cues for avoiding edges of sidewalk. Pt then navigated back to elevators and was able to back in then safely navigated back to room. In room PTA then had pt's wife [name] direct PTA as +2 to have pt Hoyer transfer back into bed. [name] required min cues for pad placement and how best to direct care to second person. Pt left in bed at end of session with wife present and current needs met.      Therapy Documentation Precautions:  Precautions Precautions: Fall, Cervical Precaution Comments: C6 laminectomy, MAP >65 Restrictions Weight Bearing Restrictions: No RUE Weight Bearing: Weight bearing as tolerated General:   Vital Signs: Therapy Vitals Temp: 98.2 F (36.8 C) Temp Source: Oral Pulse Rate: 68 Resp: 18 BP: (!) 150/76 Patient Position (if appropriate): Sitting Oxygen Therapy SpO2: 98 % O2 Device: Room Air Pain:   Mobility:   Locomotion :    Trunk/Postural Assessment :    Balance:   Exercises:   Other Treatments:      Therapy/Group: Individual Therapy  Ayeza Therriault 11/14/2021, 4:00 PM

## 2021-11-14 NOTE — Progress Notes (Signed)
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   Patient currently remains inpatient with plans of following up @ [facility] post discharge with Dr. Tasia Catchings.   Message sent to schedule 1 month follow up for post radiation treatment.    No additional needs at this time.

## 2021-11-14 NOTE — Evaluation (Signed)
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 [facility]) Active Problems:   Acute incomplete quadriplegia ([place])     Past Medical History:      Past Medical History:  Diagnosis Date   Allergy     Arthritis      osteoarthritis   GERD (gastroesophageal reflux disease)     Hypertension     Hypothyroidism     Renal calculi     Thyroid disease      Hyperthyroidism s/p radioactive iodine ablation    Past Surgical History:       Past Surgical History:  Procedure Laterality Date   CARDIAC CATHETERIZATION   03/2000   HERNIA REPAIR   [id number]    umbilical   LEFT HEART CATH AND CORONARY ANGIOGRAPHY Left 03/20/2020    Procedure: LEFT HEART CATH AND CORONARY ANGIOGRAPHY;  Surgeon: Nelva Bush, MD;  Location: [place] [facility];  Service: Cardiovascular;  Laterality: Left;   POSTERIOR CERVICAL FUSION/FORAMINOTOMY N/A 10/06/2021    Procedure: POSTERIOR CERVICAL FUSION/ FORAMINOTOMY LEVEL 3;  Surgeon: Meade Maw, MD;  Location: [facility] ORS;  Service: Neurosurgery;  Laterality: N/A;  will need monitoring   RADIOLOGY WITH ANESTHESIA Left 04/18/2021    Procedure: MRI SHOULDER WITHOUT CONTRAST WITH ANESTHESIA;  Surgeon: Radiologist, Medication, MD;  Location: [place];  Service: Radiology;  Laterality: Left;   RADIOLOGY WITH ANESTHESIA N/A 10/03/2021    Procedure: MRI CERVICAL SPINE WITH ANESTHESIA;  Surgeon: Radiologist, Medication, MD;  Location: [place];  Service: Radiology;  Laterality: N/A;   REVERSE SHOULDER ARTHROPLASTY Left     ROTATOR CUFF REPAIR   11/2010    Dr [name] Heck   TONSILLECTOMY       TOTAL HIP ARTHROPLASTY Right 02/24/2009   TOTAL HIP ARTHROPLASTY Left 06/2016    Dr Harlow Mares      Assessment & Plan Clinical Impression:  Jason [name] is a 66 year old R handed male with history of HTN, chronic neck pain, OA s/p left RTC repair 04/2021 with ongoing weakness and tingling down to 1st and 2nd digit of left hand which progress to RUE weakness. He was sent to ED for sedation prior to MRI C spine 10/03/21 for work up which showed abnormal marrow edema with diffuse involvement of C4  into thoracic spine, marked canal stenosis greatest at C6, L> R foraminal effacement due to extraosseous disease and concerns of metastatic disease v/s infiltrative marrow neoplastic process. He presented to ED on 10/04/21 for further evaluation and Abdominal pelvic CT done revealing moderate severity retroperitoneal and pelvic lymphadenopathy c/w metastatic disease, small lytic areas L4 and S1, right 2nd and 4th ribs, sacrum and left iliac bone. He was taken to OR on 08/13 by Dr. Cari Caraway for C6 cervical laminectomy for resection of tumor. On am of 08/14, he developed significant BUE/BLE weakness with hypotension requiring transfer to ICU, pressors as well as steroids for BP supported.   He was vented (due for repeat MRI due to severe claustrophobia. MRI C spine revealed similar degree of epidural tumor C4-C7, progressive canal stenosis form C4-C7 without abnormal cord signal or infarct.   MRI head, spine showed calvarial metastatic disease in right temporal region and vertex with intracranial extension of tumor along right frontal convexity without mass effect and extracranial  extension of tumor into scalp along vertex and small focus in right parietal cortex felt to be small acute infarct. MRI thoracic/lumbar spine revealed extensive osseous metastatic disease throughout thoracolumbar spine and pelvis, thin circumferential epidural tumor cervicothoracic junction extending inferiorly and of the field-of-view to T3-T4, epidural tumor along posterior endplates at T9, H82 and T12, extensive epidural tumor in lumbar spine with greatest thickness at L1 without  high-grade stenosis however superimposed on pre-existing degenerative changes in lumbar spine with severe spinal canal stenosis with cauda equina nerve root compression L3-L5, extra-medullary fluid collection along dorsal aspect of cord beginning at T9/T10 extending into lumbar spine likely subdural in location displacing cord and cauda equina nerve roots without compression Or signal abnormality and prevertebral edema and cervical spine which could be postsurgical in nature.  Incidental findings of new bilateral lower lobe consolidation question developing pneumonia v/s aspiration or atelectasis.  He was extubated without difficulty post imaging studies.  .   Dr. 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 He recommended Firmagon loading dose while awaiting final path as well as outpatient PSMA PET scan for full work up. Dr. Donella Stade with Radiation Onc recommended starting palliative XRT to cervical spine but to be delayed till labor day per Dr. Maryann Alar. Dr. Quinn Axe w/neuro also consulted for input on small parietal stroke seen on MRI brain and recommended avoiding hypotension as well as low dose ASA on hgb stabilizes. Palliative care has discussed [place] with wife who will discuss it with patient "when he is able to discuss it" and currently on full scope of treatment. He developed fever with somnolence, acute hypoxic respiratory failure and tachycardia on 08/19 and IV antibiotics added due to concerns of aspiration PNA.  AKI was treated with IVF and he was transfused with one unit PRBC due to drop in Hgb to 7.7.  Oxygen needs decreasing form to 2 L per [place] with improvement in secretions management. Lovenox added for DVT prophylaxis 08/17 and Unasyn transitioned to augmetin today for 5 additional days of antibiotic coverage. Steroids have been weaned off.    ST evaluation without overt s/s of aspiration and general swallow precautions/implications of fatigue  with recs to stop therapy 1 hour before meals to allow to rest for conservation energy for meals-->D3 w/chopped meats and rest breaks during meals and staff assist w/meals. 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 CIR recommended due to functional decline. Patient transferred to CIR on 10/17/2021.  Pt presents with decreased activity tolerance, decreased functional mobility, decreased balance, decreased coordination, Limiting pt's independence with leisure/community pursuits.  Met with pt & wife briefly today to discuss TR services including leisure education, activity analysis/modifications and stress management.  Also discussed the importance of social, emotional, spiritual health in addition to physical health and their effects on overall health and wellness.  Pt stated understanding.  Plan  No further TR as pt is expected to discharge home 9/26  Recommendations for other services: None   Discharge Criteria: Patient will be discharged from TR if patient refuses treatment 3 consecutive times without medical reason.  If treatment goals not met, if there is a change in medical status, if patient makes no progress towards goals or if patient is discharged from hospital.  The above assessment, treatment plan, treatment alternatives and goals were discussed and mutually agreed upon: by patient  [place] 11/14/2021, 3:47 PM

## 2021-11-14 NOTE — Progress Notes (Signed)
PROGRESS NOTE   Subjective/Complaints:  No new complaints or concerns.   LBM 9/20     11/14/2021    5:18 AM 11/13/2021    7:44 PM 11/13/2021    3:58 PM  Vitals with BMI  Systolic [phone]  Diastolic 68 65 85  Pulse 65 68 66     ROS: Denies fevers,chills, HA, N/V, abdominal pain, constipation, diarrhea, SOB, cough, abdominal pain, chest pain, new weakness or paraesthesias.    Objective:   No results found. Recent Labs    11/14/21 0711  WBC 5.3  HGB 11.3*  HCT 35.0*  PLT 219    No results for input(s): "NA", "K", "CL", "CO2", "GLUCOSE", "BUN", "CREATININE", "CALCIUM" in the last 72 hours.   Intake/Output Summary (Last 24 hours) at 11/14/2021 0811 Last data filed at 11/14/2021 0700 Gross per 24 hour  Intake 1424 ml  Output 2450 ml  Net -1026 ml         Physical Exam: Vital Signs reviewed as above.   General: awake, alert, appropriate, sitting up in WC ; NAD HENT: CN, AT, conjugate gaze; oropharynx moist CV: regular rate; no JVD Pulmonary: CTA B/L; no W/R/R- good air movement GI: soft, NT, ND, (+)BS Psychiatric: appropriate, pleasant Neurological: Ox3, follows commands Genitourinary:Golden yellow urine in bag, minimal sediment   Musculoskeletal: L shoulder K tape in place- shoulder still subluxed ~1"    Cervical back: Neck supple.   Moving all 4 extremities today; prior exams:  RUE- biceps 5/5; WE 4-/5; Triceps 2+/5; Grip 3-/5; FA 2-/5 LUE- biceps 5-/5; WE 4/5; Triceps 2+/5' Grip 3+/5; FA 2-/5 RLE- 1to 1+/5 HE, KE and trace at anke LLE- HF 1+/5; KE -/5; DF 1/5; PF 2/5; EHL 1/5   Decreased to light touch from L1 inferiorly       Assessment/Plan: 1. Functional deficits which require 3+ hours per day of interdisciplinary therapy in a comprehensive inpatient rehab setting. Physiatrist is providing close team supervision and 24 hour management of active medical problems listed below. Physiatrist  and rehab team continue to assess barriers to discharge/monitor patient progress toward functional and medical goals  Care Tool:  Bathing    Body parts bathed by patient: Face   Body parts bathed by helper: Right arm, Left arm, Chest, Abdomen, Front perineal area, Buttocks, Left lower leg, Right lower leg, Left upper leg, Right upper leg     Bathing assist Assist Level: Dependent - Patient 0%     Upper Body Dressing/Undressing Upper body dressing   What is the patient wearing?: Pull over shirt    Upper body assist Assist Level: Minimal Assistance - Patient > 75%    Lower Body Dressing/Undressing Lower body dressing      What is the patient wearing?: Pants     Lower body assist Assist for lower body dressing: Total Assistance - Patient < 25%     Toileting Toileting    Toileting assist Assist for toileting: Dependent - Patient 0%     Transfers Chair/bed transfer  Transfers assist  Chair/bed transfer activity did not occur: Safety/medical concerns  Chair/bed transfer assist level: 2 Helpers     Locomotion Ambulation   Ambulation  assist   Ambulation activity did not occur: Safety/medical concerns          Walk 10 feet activity   Assist  Walk 10 feet activity did not occur: Safety/medical concerns        Walk 50 feet activity   Assist Walk 50 feet with 2 turns activity did not occur: Safety/medical concerns         Walk 150 feet activity   Assist Walk 150 feet activity did not occur: Safety/medical concerns         Walk 10 feet on uneven surface  activity   Assist Walk 10 feet on uneven surfaces activity did not occur: Safety/medical concerns         Wheelchair     Assist Is the patient using a wheelchair?: Yes Type of Wheelchair: Power Wheelchair activity did not occur: Safety/medical concerns  Wheelchair assist level: Supervision/Verbal cueing Max wheelchair distance: >300    Wheelchair 50 feet with 2 turns  activity    Assist    Wheelchair 50 feet with 2 turns activity did not occur: Safety/medical concerns   Assist Level: Supervision/Verbal cueing   Wheelchair 150 feet activity     Assist  Wheelchair 150 feet activity did not occur: Safety/medical concerns   Assist Level: Supervision/Verbal cueing   Blood pressure 125/68, pulse 65, temperature 97.8 F (36.6 C), temperature source Oral, resp. rate 16, height _0  (1.753 m), weight 99.9 kg, SpO2 100 %.  Medical Problem List and Plan: 1. 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  Antithrombotics: -DVT/anticoagulation:  Pharmaceutical: Lovenox.         9/13- spoke with Oncology- agree with me that needs Eliquis for a total of 3 months, even though DVT is gone, is at very high risk due to CA and new SCI.              -antiplatelet therapy: ASA 3. Pain: continue Oxycodone prn. 8/28- pt reports no significant pain- and doesn't want spasticity meds at this time. 9/15- pain controlled- con't regimen prn  4. Mood/Behavior/Sleep: LCSW to follow for evaluation and support.              -antipsychotic agents: N/A 5. Neuropsych/cognition: This patient is capable of making decisions on his own behalf. 6. Skin/Wound Care: Routine pressure relief measures              --monitor incision for healing.  7. Fluids/Electrolytes/Nutrition: Monitor I/O. Check CMET in am.  8. Prostate AdenoCA w/mets: Loading dose Firmagon 240 mg 10/06/21 per Dr. Tasia Catchings --Jason [name] changed to [facility] due to insurance coverage--next dose around 09/13. --Simulation for XRT 08/31 at 2 pm at [facility] 9/6- has appt 10/11 for Eliguard and  MD f/u.  9/13- actually XRT will stop 9/20 per Oncology.  9/21 Finished radiation treatment 9/20, f/u with Dr. Tasia Catchings 9. Dysphagia: pt now on regular/thins 10. Neurogenic bladder: continue foley for now             8/31- pt  doesn't want to remove to  see if can void; or to do in/out caths- thinks too much stress on family.              9/3- still has some brownish  urine- see through. 9/7 urine generally clear. May have some periodic blood d/t anticoagulation 9/20 foley replaced yesterday  11. Neurogenic bowel: Will get KUB to determine stool burden --continue Colace in am with suppository after supper.  8/25- had good results with bowel program 8/27- good results with bowel program yesterday 8/28- having bowel accidents- and BM with bowel program last 3 nights-  8/29- 2 small BM's last night after bowel program- con't regimen 9/2-9/3 Having results with bowel program nightly           9/5- Didn't have results last night- but had before- will con't to monitor 9/8 pt had large, timely result with fleet enema last night. Continue thru weekend and perhaps convert back to suppository Monday.  9/11 Had large BM with enema, will continue tonight, consider restart suppository tomorrow 9/12- will change back to dulcolax supp as of tonight- still need dig stim- restarted bowel program             9/13- still having bowel accidents, but less frequent             9/14- wife to learn bowel program this evening- educated that needs to do nightly to get him on regimen- takes ~ 6 weeks to train gut 9/15 BM with dig stim overnight. Per patient preference, will keep PM bowel program.  9/21 LBM today,doing well with current program 12. Sepsis/Likely aspiration PNA: Now on Augmentin--antibiotic D#5/10. BC pend-->neg so far.              --Follow up CXR in am.              --pulmonary hygiene for atelectasis/Asymmetric volume loss on right.  13. Abnormal LFTs: Improved overall except for elevation in Aphos--315.  Will recheck in am             8/25- Alk phos up to 457; AST 62 and ALT 96- except for alk phos, they are heading downwards- con't to monitor weekly.   9/21 Recheck Monday, ordered 14. ABLA: improving, monitor weekly   Up to HGB 11.3 on 9/21   15. Leucocytosis: Recheck in am. Monitor for fevers/other signs of infection.               8/25- WBC 17.1k- on Augmentin- will recheck labs in AM             -WBC improved to 15.6 10/19/21             8/28- Down to 10.8k- con't to monitor             8/31- WBC 9.5k- doing better            9/3 - 7.9--->4.6 on 9/9, improved  -WBC 5.3 on 9/21   16. Hypotension: continue midodrine.              9/11 BP has been stable, denies symptoms, continue midodrine   9/20 Well controlled today, was a mildly elevated yesterday,continue midodrine, monitor 17. DVT left gastrocnemius vein             -8/26 Noted Lovenox was increased 8/25 to 11m BID, Distal DVT, will recheck in 1-2 weeks, compression stockings,  9/6- needs to go home on Tx lovenox- will need a minimum of 3 months due to DVT- cannot switch to Eliquis, etc since cancer related DVTs need Lovenox!             9/12- New Dopplers show no DVT- will d/w Oncology the plan since no DVT now, HOWEVER did have one and Cancer dramatically increases his risk.              9/13- put on Eliquis per Oncology for a minimum of 3 months  18. Spasticity             8/28- pt doesn't want spasticity meds- isn't painful or annoying.              9/6- Spasticity somewhat better today             9/14- educated can get worse for up to 2 years 19. Dispo             8/28- will d/c staples and see if PA can stop appointments at [facility]- only at [facility]. 8/29- need to see about Mills Koller -supposed to be given 9/12- Oncology to handle it- L/M/via PA about this.  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 Left hand PIP digit swelling: placed OT order for kinesiotaping and nursing order for ice application-improved 21. Post nasal drip             9/13- will add Flonase daily for cough related to nasal drip-             9/14- helping wishes had started "years ago".   22. Flu shot- will get tomorrow- ordered             9/15- to get this AM    LOS: 28 days A FACE TO [place] 11/14/2021, 8:11 AM

## 2021-11-15 ENCOUNTER — Inpatient Hospital Stay: Payer: BC Managed Care – PPO | Admitting: Oncology

## 2021-11-15 ENCOUNTER — Ambulatory Visit: Payer: BC Managed Care – PPO

## 2021-11-15 ENCOUNTER — Encounter: Payer: Self-pay | Admitting: Oncology

## 2021-11-15 ENCOUNTER — Other Ambulatory Visit: Payer: BC Managed Care – PPO

## 2021-11-15 NOTE — Progress Notes (Signed)
PROGRESS NOTE   Subjective/Complaints:  Pt in WC, no new concerns.   LBM 9/20     11/15/2021    5:15 AM 11/14/2021    8:25 PM 11/14/2021    1:57 PM  Vitals with BMI  Systolic [phone]  Diastolic 66 57 76  Pulse 64 69 68     ROS: Denies fevers,chills, HA, N/V, abdominal pain, constipation, diarrhea, SOB, cough, abdominal pain, vision changes, chest pain, new weakness or paraesthesias.    Objective:   No results found. Recent Labs    11/14/21 0711  WBC 5.3  HGB 11.3*  HCT 35.0*  PLT 219    No results for input(s): "NA", "K", "CL", "CO2", "GLUCOSE", "BUN", "CREATININE", "CALCIUM" in the last 72 hours.   Intake/Output Summary (Last 24 hours) at 11/15/2021 4650 Last data filed at 11/14/2021 2030 Gross per 24 hour  Intake 1182 ml  Output 2850 ml  Net -1668 ml         Physical Exam: Vital Signs reviewed as above.   General: awake, alert, appropriate, sitting up in WC ; NAD HENT: CN, AT, conjugate gaze; oropharynx moist CV: RRR; no JVD Pulmonary: CTA B/L; no W/R/R- good air movement GI: soft, NT, ND, + bowel sounds Psychiatric: appropriate, pleasant Neurological: Ox4, follows commands Genitourinary:Golden yellow urine in bag, minimal sediment   Musculoskeletal: L shoulder K tape in place- shoulder still subluxed ~1"    Cervical back: Neck supple.   Moving all 4 extremities today; prior exams:  RUE- biceps 5/5; WE 4-/5; Triceps 2+/5; Grip 3-/5; FA 2-/5 LUE- biceps 5-/5; WE 4/5; Triceps 2+/5' Grip 3+/5; FA 2-/5 RLE- 1to 1+/5 HE, KE and trace at anke LLE- HF 1+/5; KE -/5; DF 1/5; PF 2/5; EHL 1/5   Decreased to light touch from L1 inferiorly       Assessment/Plan: 1. Functional deficits which require 3+ hours per day of interdisciplinary therapy in a comprehensive inpatient rehab setting. Physiatrist is providing close team supervision and 24 hour management of active medical problems listed  below. Physiatrist and rehab team continue to assess barriers to discharge/monitor patient progress toward functional and medical goals  Care Tool:  Bathing    Body parts bathed by patient: Left upper leg, Front perineal area, Abdomen, Chest, Left arm, Face   Body parts bathed by helper: Front perineal area, Right arm, Buttocks, Right upper leg, Right lower leg, Left lower leg     Bathing assist Assist Level: Moderate Assistance - Patient 50 - 74%     Upper Body Dressing/Undressing Upper body dressing   What is the patient wearing?: Pull over shirt    Upper body assist Assist Level: Minimal Assistance - Patient > 75%    Lower Body Dressing/Undressing Lower body dressing      What is the patient wearing?: Pants     Lower body assist Assist for lower body dressing: Total Assistance - Patient < 25%     Toileting Toileting    Toileting assist Assist for toileting: Dependent - Patient 0%     Transfers Chair/bed transfer  Transfers assist  Chair/bed transfer activity did not occur: Safety/medical concerns  Chair/bed transfer assist level: 2  Helpers     Locomotion Ambulation   Ambulation assist   Ambulation activity did not occur: Safety/medical concerns          Walk 10 feet activity   Assist  Walk 10 feet activity did not occur: Safety/medical concerns        Walk 50 feet activity   Assist Walk 50 feet with 2 turns activity did not occur: Safety/medical concerns         Walk 150 feet activity   Assist Walk 150 feet activity did not occur: Safety/medical concerns         Walk 10 feet on uneven surface  activity   Assist Walk 10 feet on uneven surfaces activity did not occur: Safety/medical concerns         Wheelchair     Assist Is the patient using a wheelchair?: Yes Type of Wheelchair: Power Wheelchair activity did not occur: Safety/medical concerns  Wheelchair assist level: Supervision/Verbal cueing Max wheelchair  distance: >300    Wheelchair 50 feet with 2 turns activity    Assist    Wheelchair 50 feet with 2 turns activity did not occur: Safety/medical concerns   Assist Level: Supervision/Verbal cueing   Wheelchair 150 feet activity     Assist  Wheelchair 150 feet activity did not occur: Safety/medical concerns   Assist Level: Supervision/Verbal cueing   Blood pressure 126/66, pulse 64, temperature 98 F (36.7 C), resp. rate 16, height '5\' 9"'  (1.753 m), weight 99.9 kg, SpO2 98 %.  Medical Problem List and Plan: 1. 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  Antithrombotics: -DVT/anticoagulation:  Pharmaceutical: Lovenox.         9/13- spoke with Oncology- agree with me that needs Eliquis for a total of 3 months, even though DVT is gone, is at very high risk due to CA and new SCI.              -antiplatelet therapy: ASA 3. Pain: continue Oxycodone prn. 8/28- pt reports no significant pain- and doesn't want spasticity meds at this time. 9/15- pain controlled- con't regimen prn  4. Mood/Behavior/Sleep: LCSW to follow for evaluation and support.              -antipsychotic agents: N/A 5. Neuropsych/cognition: This patient is capable of making decisions on his own behalf. 6. Skin/Wound Care: Routine pressure relief measures              --monitor incision for healing.  7. Fluids/Electrolytes/Nutrition: Monitor I/O. Check CMET in am.  8. Prostate AdenoCA w/mets: Loading dose Firmagon 240 mg 10/06/21 per Dr. Tasia Catchings --Charlesetta Garibaldi changed to [facility] due to insurance coverage--next dose around 09/13. --Simulation for XRT 08/31 at 2 pm at [facility] 9/6- has appt  10/11 for Eliguard and MD f/u.  9/13- actually XRT will stop 9/20 per Oncology.  9/21 Finished radiation treatment 9/20, f/u with Dr. Tasia Catchings 9. Dysphagia: pt now on regular/thins 10. Neurogenic bladder: continue foley for now  8/31- pt doesn't want to remove to  see if can void; or to do in/out caths- thinks too much stress on family.              9/3- still has some brownish  urine- see through. 9/7 urine generally clear. May have some periodic blood d/t anticoagulation 9/20 foley replaced yesterday  11. Neurogenic bowel: Will get KUB to determine stool burden --continue Colace in am with suppository after supper.  8/25- had good results with bowel program 8/27- good results with bowel program yesterday 8/28- having bowel accidents- and BM with bowel program last 3 nights-  8/29- 2 small BM's last night after bowel program- con't regimen 9/2-9/3 Having results with bowel program nightly           9/5- Didn't have results last night- but had before- will con't to monitor 9/8 pt had large, timely result with fleet enema last night. Continue thru weekend and perhaps convert back to suppository Monday.  9/11 Had large BM with enema, will continue tonight, consider restart suppository tomorrow 9/12- will change back to dulcolax supp as of tonight- still need dig stim- restarted bowel program             9/13- still having bowel accidents, but less frequent             9/14- wife to learn bowel program this evening- educated that needs to do nightly to get him on regimen- takes ~ 6 weeks to train gut 9/15 BM with dig stim overnight. Per patient preference, will keep PM bowel program.  9/22 LBM today, regular BMS, continue current regimen 12. Sepsis/Likely aspiration PNA: Now on Augmentin--antibiotic D#5/10. BC pend-->neg so far.              --Follow up CXR in am.              --pulmonary hygiene for atelectasis/Asymmetric volume loss on right.  13. Abnormal LFTs: Improved overall  except for elevation in Aphos--315. Will recheck in am             8/25- Alk phos up to 457; AST 62 and ALT 96- except for alk phos, they are heading downwards- con't to monitor weekly.   9/21 Recheck Monday, ordered 14. ABLA: improving, monitor weekly   Up to HGB 11.3 on 9/21   15. Leucocytosis: Recheck in am. Monitor for fevers/other signs of infection.               8/25- WBC 17.1k- on Augmentin- will recheck labs in AM             -WBC improved to 15.6 10/19/21             8/28- Down to 10.8k- con't to monitor             8/31- WBC 9.5k- doing better            9/3 - 7.9--->4.6 on 9/9, improved  -WBC 5.3 on 9/21   16. Hypotension: continue midodrine.              9/11 BP has been stable, denies symptoms, continue midodrine   9/22 Well controlled, intermittently elevated, continue midodrine for now 17. DVT left gastrocnemius vein             -8/26 Noted Lovenox was increased 8/25 to 155m BID, Distal DVT, will recheck in 1-2 weeks, compression stockings,  9/6- needs to go home on Tx lovenox- will need a minimum of 3 months due to DVT- cannot switch to Eliquis, etc since cancer related DVTs need Lovenox!             9/12- New Dopplers show no DVT- will d/w Oncology the plan since no DVT now, HOWEVER did have one and Cancer dramatically increases his risk.              9/13- put on Eliquis per Oncology for a minimum of 3 months  18. Spasticity             8/28- pt doesn't want spasticity meds- isn't painful or annoying.              9/6- Spasticity somewhat better today             9/14- educated can get worse for up to 2 years 19. Dispo             8/28- will d/c staples and see if PA can stop appointments at [facility]- only at [facility]. 8/29- need to see about Mills Koller -supposed to be given 9/12- Oncology to handle it- L/M/via PA about this.  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 Left hand PIP digit swelling: placed OT order for kinesiotaping and nursing order for ice application-improved 21. Post nasal drip             9/13- will add Flonase daily for cough related to nasal drip-             9/14- helping wishes had started "years ago".   22. Flu shot- will get tomorrow- ordered             9/15- to get this AM    LOS: 29 days A FACE TO [place] 11/15/2021, 8:12 AM

## 2021-11-15 NOTE — Progress Notes (Signed)
Occupational Therapy Session Note  Patient Details  Name: Jason [name] MRN: [id number] Date of Birth: 08/23/1955  Today's Date: 11/15/2021 OT Individual Time: 0700-0813 OT Individual Time Calculation (min): 73 min    Short Term Goals: Week 5:  OT Short Term Goal 1 (Week 5): STG=LTG 2/2 ELOS  Skilled Therapeutic Interventions/Progress Updates:    OT intervention with focus on bed mobility, dressing at bed level, SB tranfsers, grooming, PWC mobility, and RUE pinch strength to increase independence with BADLs. Min A for unsupported sitting balance with HOB elevated to doff/don pull over shirt. Min a for doffing/donning pullover shirt with HOB elevated. Max A for threading pants over BLE and pulling up to hips. Rolling in bed with min A to facilitate pulling pants over hips. Supine>sit EOB with max A. SB tranfser to [place] with max A+1 with +1 for safety. Pt completed grooming at sink with setup. Pt engaged in pincher exercises with clothes pins. Pt with increased difficulty pinching black clothes pins but able to place 4 on dowel. Pt returned to room and remained in [place]. All needs within reach. NT notified to assist pt with breakfast.  Therapy Documentation Precautions:  Precautions Precautions: Fall, Cervical Precaution Comments: C6 laminectomy, MAP >65 Restrictions Weight Bearing Restrictions: Yes RUE Weight Bearing: Weight bearing as tolerated Pain:  Pt denies pain this morning   Therapy/Group: Individual Therapy  Leroy Libman 11/15/2021, 8:16 AM

## 2021-11-15 NOTE — Progress Notes (Signed)
Occupational Therapy Weekly Progress Note  Patient Details  Name: Jason [name] MRN: [id number] Date of Birth: 12/16/1955  Beginning of progress report period: November 08, 2021 End of progress report period: November 15, 2021    Patient has met 3 of 3 short term goals.  Pt making steady progress with dressing at bed level, bathing at shower level, sitting balance, and SB transfers. Pt's wife consistently completes hoyer transfers with staff assisting. Pt's wife independently directs 2nd person regarding assistance required. Pt's wife assists pt with BADLs at bed level. UB dressing with min A and LB dressing with max A. Bathing at shower level with mod A. Hoyer transfer to [employer] for bathing at shower level. Pt Doctor, general practice for use at home and bathroom being modified to accomodate roll in shower.   Patient continues to demonstrate the following deficits: {impairments:3041632} and therefore will continue to benefit from skilled OT intervention to enhance overall performance with {ADL/iADL:3041649}.  Patient {LTG progression:3041653}.  {plan of PPGF:8421031}  OT Short Term Goals Week 4:  OT Short Term Goal 1 (Week 4): Pt will perform sitting EOB/EOM with min A for RUE simple functional or reaching task OT Short Term Goal 1 - Progress (Week 4): Met OT Short Term Goal 2 (Week 4): Pt will thread BLE into pants with max A seated in half circle sitting at bed level OT Short Term Goal 2 - Progress (Week 4): Met OT Short Term Goal 3 (Week 4): Pt will perform SB transfer with max A+1 OT Short Term Goal 3 - Progress (Week 4): Met Week 5:  OT Short Term Goal 1 (Week 5): STG=LTG 2/2 ELOS  Leotis Shames [facility] 11/15/2021, 6:58 AM

## 2021-11-15 NOTE — Progress Notes (Signed)
Physical Therapy Session Note  Patient Details  Name: Jason [name] MRN: [id number] Date of Birth: [birth year]  Today's Date: 11/15/2021 PT Individual Time: 1045-1202 PT Individual Time Calculation (min): 77 min   Short Term Goals: Week 4:  PT Short Term Goal 1 (Week 4): =LTGs d/t ELOS  Skilled Therapeutic Interventions/Progress Updates:    Pt received in [place], agreeable to therapy. No complaint of pain. Pt navigated PWC at mod I level, needing only occasional cues in tight spaces for navigational cues. Pt performed slideboard transfer <>mat table with light max A and CGA from therapy tech. Pt's wife assisted with sit>supine, managing BLE. Attempted to achieve supine on elbows but pt with limited shoulder ROM to achieve this position. Pt then assisted into long sitting for with physioball support from tech throughout. Pt participated in passive and active assist ROM from long sitting<>circle sitting positions. Discussed using these positions for ADLs in the future. Pt with improved activation of LLE compared to right, with good activation of hamstrings, hip flexors, adductors, and quads. Trace activation of these muscle groups noted in RLE but required active assist for all motions. Pt with significant fatigue after this activity but able to perform strong transfer back to chair. Pt returned to room and remained in chair, was left with all needs in reach and his wife present.   Therapy Documentation Precautions:  Precautions Precautions: Fall, Cervical Precaution Comments: C6 laminectomy, MAP >65 Restrictions Weight Bearing Restrictions: Yes RUE Weight Bearing: Weight bearing as tolerated General:      Therapy/Group: Individual Therapy  [name] 11/15/2021, 12:22 PM

## 2021-11-15 NOTE — Progress Notes (Signed)
Physical Therapy Session Note  Patient Details  Name: Jason [name] MRN: [id number] Date of Birth: 27-May-1955  Today's Date: 11/15/2021 PT Individual Time: 3818-4037 PT Individual Time Calculation (min): 60 min   Short Term Goals: Week 4:  PT Short Term Goal 1 (Week 4): =LTGs d/t ELOS  Skilled Therapeutic Interventions/Progress Updates: Pt presented in [place] with wife [name] present agreeable to therapy. Pt denies pain at rest. Pt's urine bag noted to be significantly red. Per pt and wife, NT and nsg notified with PTA also notifying nsg. Wife expressed concerns regarding when home when would need to call MD if this happens. Advised to have a discussion with PA (Pam), but when home can also use resources such as nsg hotline and tele health. Pt agreeable to participate in tilt table activities this session. Pt navigated to day room and transferred to tile table via [employer]. BP assessed as noted below with vitals remaining stable spending ~25 min on tilt table and ~25mn at 70 degrees. BP supine 154/78 (96) HR 72, 40 degrees 148/70 (93) HR 71, at end of session 118/70 (84) HR 84. At 70 degrees lowest strap loosened and pt able to perform TKE with LLE but requiring PTA to unlock L knee and TKE with tapping of quad for increased recruitment with RLE. Pt was able to perform approx 10 on RLE with rest breaks and to fatigue on LLE. With fatigue pt noted to have increased R lateral lean. Once completed [facility]via [employer]and pt navigated back to room. [place] with wife present and current needs met.     Therapy Documentation Precautions:  Precautions Precautions: Fall, Cervical Precaution Comments: C6 laminectomy, MAP >65 Restrictions Weight Bearing Restrictions: Yes RUE Weight Bearing: Weight bearing as tolerated General:   Vital Signs: Therapy Vitals Pulse Rate: 72 BP: (!) 140/71 Pain:   Mobility:   Locomotion :    Trunk/Postural Assessment :     Balance:   Exercises:   Other Treatments:      Therapy/Group: Individual Therapy  Yaeli Hartung 11/15/2021, 3:53 PM

## 2021-11-16 MED ORDER — PHENOL 1.4 % MT LIQD
1.0000 | OROMUCOSAL | Status: DC | PRN
  Administered 2021-11-17 – 2021-11-18 (×3): 1 via OROMUCOSAL
  Filled 2021-11-16: qty 177

## 2021-11-16 NOTE — Progress Notes (Signed)
PROGRESS NOTE   Subjective/Complaints:  Pt with sore, dry throat. Otherwise doing ok  ROS: Patient denies fever, rash, sore throat, blurred vision, dizziness, nausea, vomiting, diarrhea, cough, shortness of breath or chest pain, joint or back/neck pain, headache, or mood change.   Objective:   No results found. Recent Labs    11/14/21 0711  WBC 5.3  HGB 11.3*  HCT 35.0*  PLT 219   No results for input(s): "NA", "K", "CL", "CO2", "GLUCOSE", "BUN", "CREATININE", "CALCIUM" in the last 72 hours.   Intake/Output Summary (Last 24 hours) at 11/16/2021 1029 Last data filed at 11/16/2021 1219 Gross per 24 hour  Intake 1380 ml  Output 2900 ml  Net -1520 ml        Physical Exam: Vital Signs reviewed as above.   Constitutional: No distress . Vital signs reviewed. HEENT: NCAT, EOMI, oral membranes moist Neck: voice hoarse Cardiovascular: RRR without murmur. No JVD    Respiratory/Chest: CTA Bilaterally without wheezes or rales. Normal effort. Coarse upper airway sounds esp on right   GI/Abdomen: BS +, non-tender, non-distended Ext: no clubbing, cyanosis, or edema Psych: pleasant and cooperative  Neurological: Ox4, follows commands Genitourinary:Golden yellow urine in bag, minimal sediment   Musculoskeletal: L shoulder K tape in place- shoulder still subluxed ~1"    Cervical back: Neck supple.   Moving all 4 extremities today; prior exams:  RUE- biceps 5/5; WE 4-/5; Triceps 2+/5; Grip 3-/5; FA 2-/5 LUE- biceps 5-/5; WE 4/5; Triceps 2+/5' Grip 3+/5; FA 2-/5 RLE- 1to 1+/5 HE, KE and trace at anke LLE- HF 1+/5; KE -/5; DF 1/5; PF 2/5; EHL 1/5 Motor exam consistent with above Decreased to light touch from L1 inferiorly       Assessment/Plan: 1. Functional deficits which require 3+ hours per day of interdisciplinary therapy in a comprehensive inpatient rehab setting. Physiatrist is providing close team supervision and 24  hour management of active medical problems listed below. Physiatrist and rehab team continue to assess barriers to discharge/monitor patient progress toward functional and medical goals  Care Tool:  Bathing    Body parts bathed by patient: Left upper leg, Front perineal area, Abdomen, Chest, Left arm, Face   Body parts bathed by helper: Front perineal area, Right arm, Buttocks, Right upper leg, Right lower leg, Left lower leg     Bathing assist Assist Level: Moderate Assistance - Patient 50 - 74%     Upper Body Dressing/Undressing Upper body dressing   What is the patient wearing?: Pull over shirt    Upper body assist Assist Level: Minimal Assistance - Patient > 75%    Lower Body Dressing/Undressing Lower body dressing      What is the patient wearing?: Pants     Lower body assist Assist for lower body dressing: Total Assistance - Patient < 25%     Toileting Toileting    Toileting assist Assist for toileting: Dependent - Patient 0%     Transfers Chair/bed transfer  Transfers assist  Chair/bed transfer activity did not occur: Safety/medical concerns  Chair/bed transfer assist level: 2 Helpers     Locomotion Ambulation   Ambulation assist   Ambulation activity did not occur: Safety/medical  concerns          Walk 10 feet activity   Assist  Walk 10 feet activity did not occur: Safety/medical concerns        Walk 50 feet activity   Assist Walk 50 feet with 2 turns activity did not occur: Safety/medical concerns         Walk 150 feet activity   Assist Walk 150 feet activity did not occur: Safety/medical concerns         Walk 10 feet on uneven surface  activity   Assist Walk 10 feet on uneven surfaces activity did not occur: Safety/medical concerns         Wheelchair     Assist Is the patient using a wheelchair?: Yes Type of Wheelchair: Power Wheelchair activity did not occur: Safety/medical concerns  Wheelchair assist  level: Supervision/Verbal cueing Max wheelchair distance: >300    Wheelchair 50 feet with 2 turns activity    Assist    Wheelchair 50 feet with 2 turns activity did not occur: Safety/medical concerns   Assist Level: Supervision/Verbal cueing   Wheelchair 150 feet activity     Assist  Wheelchair 150 feet activity did not occur: Safety/medical concerns   Assist Level: Supervision/Verbal cueing   Blood pressure 135/70, pulse 72, temperature 97.9 F (36.6 C), resp. rate 16, height $RemoveBe'5\' 9"'gPvTxKiSD$  (1.753 m), weight 99.9 kg, SpO2 96 %.  Medical Problem List and Plan: 1. 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  Antithrombotics: -DVT/anticoagulation:  Pharmaceutical: Lovenox.         9/13- spoke with Oncology- agree with me that needs Eliquis for a total of 3 months, even though DVT is gone, is at very high risk due to CA and new SCI.              -antiplatelet therapy: ASA 3. Pain: continue Oxycodone prn. 8/28- pt reports no significant pain- and doesn't want spasticity meds at this time. 9/15- pain controlled- con't regimen prn  4. Mood/Behavior/Sleep: LCSW to follow for evaluation and support.              -antipsychotic agents: N/A 5. Neuropsych/cognition: This patient is capable of making decisions on his own behalf. 6. Skin/Wound Care: Routine pressure relief measures              --monitor incision for healing.  7. Fluids/Electrolytes/Nutrition: Monitor I/O. Check CMET in am.  8. Prostate AdenoCA w/mets: Loading dose Firmagon 240 mg 10/06/21 per Dr. Tasia Catchings --Charlesetta Garibaldi changed to [facility] due to insurance coverage--next dose around  09/13. --Simulation for XRT 08/31 at 2 pm at [facility] 9/6- has appt 10/11 for Eliguard and MD f/u.  9/13- actually XRT will stop 9/20 per Oncology.  9/21 Finished radiation treatment 9/20, f/u with Dr. Tasia Catchings 9. Dysphagia: pt now on regular/thins 10. Neurogenic bladder: continue foley for now             8/31- pt doesn't want to remove to  see if  can void; or to do in/out caths- thinks too much stress on family.              9/3- still has some brownish  urine- see through. 9/7 urine generally clear. May have some periodic blood d/t anticoagulation 9/20 foley replaced yesterday  11. Neurogenic bowel: Will get KUB to determine stool burden --continue Colace in am with suppository after supper.  8/25- had good results with bowel program 8/27- good results with bowel program yesterday 8/28- having bowel accidents- and BM with bowel program last 3 nights-  8/29- 2 small BM's last night after bowel program- con't regimen 9/2-9/3 Having results with bowel program nightly           9/5- Didn't have results last night- but had before- will con't to monitor 9/8 pt had large, timely result with fleet enema last night. Continue thru weekend and perhaps convert back to suppository Monday.  9/11 Had large BM with enema, will continue tonight, consider restart suppository tomorrow 9/12- will change back to dulcolax supp as of tonight- still need dig stim- restarted bowel program             9/13- still having bowel accidents, but less frequent             9/14- wife to learn bowel program this evening- educated that needs to do nightly to get him on regimen- takes ~ 6 weeks to train gut 9/15 BM with dig stim overnight. Per patient preference, will keep PM bowel program.  9/23 LBM 9/22 with program, regular BMS, continue current regimen 12. Sepsis/Likely aspiration PNA: Now on Augmentin--antibiotic D#5/10. BC pend-->neg so far.              --Follow up CXR in am.              --pulmonary hygiene for  atelectasis/Asymmetric volume loss on right.  13. Abnormal LFTs: Improved overall except for elevation in Aphos--315. Will recheck in am             8/25- Alk phos up to 457; AST 62 and ALT 96- except for alk phos, they are heading downwards- con't to monitor weekly.   9/21 Recheck Monday, ordered 14. ABLA: improving, monitor weekly   Up to HGB 11.3 on 9/21   15. Leucocytosis: Recheck in am. Monitor for fevers/other signs of infection.               8/25- WBC 17.1k- on Augmentin- will recheck labs in AM             -WBC improved to 15.6 10/19/21             8/28- Down to 10.8k- con't to monitor             8/31- WBC 9.5k- doing better            9/3 - 7.9--->4.6 on 9/9, improved  -WBC 5.3 on 9/21   16. Hypotension: continue midodrine.              9/11 BP has been stable, denies symptoms, continue midodrine   9/22 Well controlled, intermittently elevated, continue midodrine for now 17. DVT left gastrocnemius vein             -8/26 Noted Lovenox was increased 8/25 to $Remov'100mg'Ccnixq$  BID, Distal DVT, will recheck in 1-2 weeks, compression stockings,              9/6- needs to go home on Tx  lovenox- will need a minimum of 3 months due to DVT- cannot switch to Eliquis, etc since cancer related DVTs need Lovenox!             9/12- New Dopplers show no DVT- will d/w Oncology the plan since no DVT now, HOWEVER did have one and Cancer dramatically increases his risk.              9/13- put on Eliquis per Oncology for a minimum of 3 months  18. Spasticity             8/28- pt doesn't want spasticity meds- isn't painful or annoying.              9/6- Spasticity somewhat better today             9/14- educated can get worse for up to 2 years 19. Dispo             8/28- will d/c staples and see if PA can stop appointments at [facility]- only at [facility]. 8/29- need to see about Mills Koller -supposed to be given 9/12- Oncology to handle it- L/M/via PA about this.  8/30- per pt, they aren't using Firmagen- are  using other product- will d/w Oncology more 9/2- XRT starts  9/12- 9/26             9/14- will finish 9/20    -Pt meets criteria for a power w/c with elevater, tilt in space, recline, elevating leg rests features due to C6 quadriplegia- from Prostate cancer with mets.compressing Spinal cord- has LE DVT so has some labile edema- so needs to elevate legs for edema control; needs elevater to transfer from hospital bed to w/c; and to prevent pressure ulcers, needs recline and tilt in space- also needs ROHO because came ot rehab with pressure ulcer   20. Left hand PIP digit swelling: placed OT order for kinesiotaping and nursing order for ice application-improved 21. Post nasal drip             9/13- will add Flonase daily for cough related to nasal drip-             9/14- helping wishes had started "years ago".   22. Flu shot- received 9/15     LOS: 30 days A FACE TO [place] 11/16/2021, 10:29 AM

## 2021-11-16 NOTE — Plan of Care (Signed)
  Problem: Consults Goal: RH SPINAL CORD INJURY PATIENT EDUCATION Description:  See Patient Education module for education specifics.  Outcome: Progressing Goal: Skin Care Protocol Initiated - if Braden Score 18 or less Description: If consults are not indicated, leave blank or document N/A Outcome: Progressing   Problem: SCI BOWEL ELIMINATION Goal: RH STG MANAGE BOWEL WITH ASSISTANCE Description: STG Manage Bowel with mod Assistance. Outcome: Progressing   Problem: SCI BLADDER ELIMINATION Goal: RH STG MANAGE BLADDER WITH ASSISTANCE Description: STG Manage Bladder With min Assistance Outcome: Progressing Goal: RH STG SCI MANAGE BLADDER PROGRAM W/ASSISTANCE Outcome: Progressing   Problem: RH SKIN INTEGRITY Goal: RH STG SKIN FREE OF INFECTION/BREAKDOWN Description: Incisions and skin will be free of infection/breakdown with min assist Outcome: Progressing Goal: RH STG MAINTAIN SKIN INTEGRITY WITH ASSISTANCE Description: STG Maintain Skin Integrity With min Assistance. Outcome: Progressing Goal: RH STG ABLE TO PERFORM INCISION/WOUND CARE W/ASSISTANCE Description: STG Able To Perform Incision/Wound Care With min Assistance. Outcome: Progressing   Problem: RH SAFETY Goal: RH STG ADHERE TO SAFETY PRECAUTIONS W/ASSISTANCE/DEVICE Description: STG Adhere to Safety Precautions With min Assistance/Device. Outcome: Progressing   Problem: RH PAIN MANAGEMENT Goal: RH STG PAIN MANAGED AT OR BELOW PT'S PAIN GOAL Description: Pain will be managed at 3 out of 10 on pain scale Outcome: Progressing   Problem: RH KNOWLEDGE DEFICIT SCI Goal: RH STG INCREASE KNOWLEDGE OF SELF CARE AFTER SCI Description: Patient/caregiver will be able to verbalize and perform self care at w/c level from nursing education, handouts and therapies.  Outcome: Progressing   Problem: RH Pre-functional/Other (Specify) Goal: RH LTG Interdisciplinary (Specify) 1 Description: RH LTG Interdisciplinary  (Specify)1 Outcome: Progressing

## 2021-11-17 ENCOUNTER — Other Ambulatory Visit: Payer: Self-pay | Admitting: Internal Medicine

## 2021-11-17 LAB — CBC
HCT: 34.4 % — ABNORMAL LOW (ref 39.0–52.0)
Hemoglobin: 11.5 g/dL — ABNORMAL LOW (ref 13.0–17.0)
MCH: 32.6 pg (ref 26.0–34.0)
MCHC: 33.4 g/dL (ref 30.0–36.0)
MCV: 97.5 fL (ref 80.0–100.0)
Platelets: 219 10*3/uL (ref 150–400)
RBC: 3.53 MIL/uL — ABNORMAL LOW (ref 4.22–5.81)
RDW: 15.9 % — ABNORMAL HIGH (ref 11.5–15.5)
WBC: 4.8 10*3/uL (ref 4.0–10.5)
nRBC: 0 % (ref 0.0–0.2)

## 2021-11-17 NOTE — Plan of Care (Signed)
  Problem: Consults Goal: RH SPINAL CORD INJURY PATIENT EDUCATION Description:  See Patient Education module for education specifics.  Outcome: Progressing Goal: Skin Care Protocol Initiated - if Braden Score 18 or less Description: If consults are not indicated, leave blank or document N/A Outcome: Progressing   Problem: SCI BOWEL ELIMINATION Goal: RH STG MANAGE BOWEL WITH ASSISTANCE Description: STG Manage Bowel with mod Assistance. Outcome: Progressing   Problem: SCI BLADDER ELIMINATION Goal: RH STG MANAGE BLADDER WITH ASSISTANCE Description: STG Manage Bladder With min Assistance Outcome: Progressing Goal: RH STG SCI MANAGE BLADDER PROGRAM W/ASSISTANCE Outcome: Progressing   Problem: RH SKIN INTEGRITY Goal: RH STG SKIN FREE OF INFECTION/BREAKDOWN Description: Incisions and skin will be free of infection/breakdown with min assist Outcome: Progressing Goal: RH STG MAINTAIN SKIN INTEGRITY WITH ASSISTANCE Description: STG Maintain Skin Integrity With min Assistance. Outcome: Progressing Goal: RH STG ABLE TO PERFORM INCISION/WOUND CARE W/ASSISTANCE Description: STG Able To Perform Incision/Wound Care With min Assistance. Outcome: Progressing   Problem: RH SAFETY Goal: RH STG ADHERE TO SAFETY PRECAUTIONS W/ASSISTANCE/DEVICE Description: STG Adhere to Safety Precautions With min Assistance/Device. Outcome: Progressing   Problem: RH PAIN MANAGEMENT Goal: RH STG PAIN MANAGED AT OR BELOW PT'S PAIN GOAL Description: Pain will be managed at 3 out of 10 on pain scale Outcome: Progressing   Problem: RH KNOWLEDGE DEFICIT SCI Goal: RH STG INCREASE KNOWLEDGE OF SELF CARE AFTER SCI Description: Patient/caregiver will be able to verbalize and perform self care at w/c level from nursing education, handouts and therapies.  Outcome: Progressing   Problem: RH Pre-functional/Other (Specify) Goal: RH LTG Interdisciplinary (Specify) 1 Description: RH LTG Interdisciplinary  (Specify)1 Outcome: Progressing

## 2021-11-17 NOTE — Progress Notes (Signed)
Occupational Therapy Session Note  Patient Details  Name: Jason [name] MRN: [id number] Date of Birth: 1955-02-25  Today's Date: 11/17/2021 OT Individual Time: 3200-3794 OT Individual Time Calculation (min): 74 min    Short Term Goals: Week 5:  OT Short Term Goal 1 (Week 5): STG=LTG 2/2 ELOS  Skilled Therapeutic Interventions/Progress Updates:    Pt resting in bed upon arrival. OT intervention with focus on dressing at bed level, bed mobility, sitting balance, SB transfers, ongoing education, and safety awareness to prepare for discharge home 9/26. HOB elevated, pt able to position UB over knees, with UE support, and initiate pulling shirt over head. Pt required min A for sitting balance (lean to Lt) to doff shirt. Min A for donning shirt with HOB elevated. Pt pulled shorts up to hips after threaded over BLE. Rolling R/L in bed with min A using bed rails to facilitate OTA pulling pants over hips. Supine>sit EOB with max A. Min A for sitting balance EOB in preparation for SB tranfser. SB transfer to [facility] with max A+1. Pt positioned PWC at sink and completed grooming tasks with set up (oral care, washing face, and brushing hair.) Pt's wife joined session. Problem solving pertaining to Foley bag cover (placement and attachment to w/c.) Pt remained in [place] with wife present and all needs within reach.   Therapy Documentation Precautions:  Precautions Precautions: Fall, Cervical Precaution Comments: C6 laminectomy, MAP >65 Restrictions Weight Bearing Restrictions: Yes RUE Weight Bearing: Weight bearing as tolerated Pain:  Pt c/o sore throat; RN aware  Therapy/Group: Individual Therapy  Leroy Libman 11/17/2021, 8:17 AM

## 2021-11-17 NOTE — Progress Notes (Signed)
Spoke with on call MD concerning patient steadily increasing blood pressure. Patient currently on Midodrine 5 mg three times a day. Patient blood pressure has been over 140 SBP before administering medication. Orders to continue to hold Midodrine with blood pressure over 140.  Sanda Linger, RN

## 2021-11-17 NOTE — Progress Notes (Signed)
Physical Therapy Session Note  Patient Details  Name: Jason [name] MRN: [id number] Date of Birth: 1955-07-30  Today's Date: 11/17/2021 PT Individual Time: 1015-1100 PT Individual Time Calculation (min): 45 min   Short Term Goals: Week 4:  PT Short Term Goal 1 (Week 4): =LTGs d/t ELOS  Skilled Therapeutic Interventions/Progress Updates:    pt received in [place]  and agreeable to therapy. Pt c/o of sore throat and painful swallowing related to radiation. Pt reports chloraseptic spray has been ordered, no other intervention required at this time. Provided refreshed education on autonomic dysreflexia to reinforce nursing education. Session focused on hoyer practice. Pt's wife performed hoyer transfer with therapist providing + 2 assist as directed to practice directing care. Dicussed pt directing assistance as well and doing rolling and assisting with lifting BLE as possible to decrease burden of care. Pt was able to roll with min A for rolling and placing sling. Pt and wife directed transfer without cueing from therapist. Pt returned to chair in the same manner. Cueing for positioning in chair using TIS feature after difficulty positioning with assist. Pt remained in chair at end of session and was left with all needs in reach and his wife present.     Therapy Documentation Precautions:  Precautions Precautions: Fall, Cervical Precaution Comments: C6 laminectomy, MAP >65 Restrictions Weight Bearing Restrictions: Yes RUE Weight Bearing: Weight bearing as tolerated General:       Therapy/Group: Individual Therapy  [name] 11/17/2021, 12:15 PM

## 2021-11-18 ENCOUNTER — Ambulatory Visit: Payer: BC Managed Care – PPO

## 2021-11-18 ENCOUNTER — Telehealth (HOSPITAL_COMMUNITY): Payer: Self-pay | Admitting: Pharmacy Technician

## 2021-11-18 ENCOUNTER — Encounter: Payer: Self-pay | Admitting: Oncology

## 2021-11-18 ENCOUNTER — Other Ambulatory Visit (HOSPITAL_COMMUNITY): Payer: Self-pay

## 2021-11-18 LAB — COMPREHENSIVE METABOLIC PANEL
ALT: 31 U/L (ref 0–44)
AST: 29 U/L (ref 15–41)
Albumin: 3.2 g/dL — ABNORMAL LOW (ref 3.5–5.0)
Alkaline Phosphatase: 474 U/L — ABNORMAL HIGH (ref 38–126)
Anion gap: 8 (ref 5–15)
BUN: 13 mg/dL (ref 8–23)
CO2: 27 mmol/L (ref 22–32)
Calcium: 8.8 mg/dL — ABNORMAL LOW (ref 8.9–10.3)
Chloride: 104 mmol/L (ref 98–111)
Creatinine, Ser: 0.53 mg/dL — ABNORMAL LOW (ref 0.61–1.24)
GFR, Estimated: >60 mL/min (ref >60)
Glucose, Bld: 131 mg/dL — ABNORMAL HIGH (ref 70–99)
Potassium: 3.6 mmol/L (ref 3.5–5.1)
Sodium: 139 mmol/L (ref 135–145)
Total Bilirubin: 0.7 mg/dL (ref 0.3–1.2)
Total Protein: 6 g/dL — ABNORMAL LOW (ref 6.5–8.1)

## 2021-11-18 MED ORDER — MAGIC MOUTHWASH
5.0000 mL | Freq: Four times a day (QID) | ORAL | Status: DC
  Administered 2021-11-18 – 2021-11-19 (×5): 5 mL via ORAL
  Filled 2021-11-18 (×6): qty 5

## 2021-11-18 NOTE — TOC Benefit Eligibility Note (Signed)
Patient Teacher, English as a foreign language completed.    The patient is currently admitted and upon discharge could be taking Eliquis 5 mg.  The current 30 day co-pay is $0.00.   The patient is insured through [place], [place] Patient Advocate Specialist [place] Patient Advocate Team Direct Number: [phone]  Fax: [phone]

## 2021-11-18 NOTE — Progress Notes (Addendum)
Patient ID: Jason [name], male   DOB: Feb 18, 1956, 66 y.o.   MRN: [id number]  SW received updates from pt wife that she is still waiting on DME. SW reached out to [employer] Staff to discuss DME delivery.  *Reports that DME should arrive within the hour. Pt wife states she has not received any updates about w/c delivery. SW will follow-up with [facility].  SW spoke with Sarah/[facility] [employer] who reported the hospital bed will be picked up today and delivered to the home.   DME delivered to the home.   Loralee Pacas, MSW, [place] Office: [phone] Cell: [phone] Fax: [phone]

## 2021-11-18 NOTE — Telephone Encounter (Signed)
Pharmacy Patient Advocate Encounter  Insurance verification completed.    The patient is insured through [employer]   The patient is currently admitted and ran test claims for the following: Eliquis.  Copays and coinsurance results were relayed to Inpatient clinical team.      

## 2021-11-18 NOTE — Progress Notes (Signed)
PROGRESS NOTE   Subjective/Complaints:  Pt reports throat still dry and sore- Chloraseptic hasn't been real helpful- he knows from radiation. He's also been having orange colored/with red tinge urine last 2 days- thinks due to prostate CA and  eliquis- I'd agree.   ROS:  Pt denies SOB, abd pain, CP, N/V/C/D, and vision changes   Objective:   No results found. Recent Labs    11/17/21 0806  WBC 4.8  HGB 11.5*  HCT 34.4*  PLT 219   No results for input(s): "NA", "K", "CL", "CO2", "GLUCOSE", "BUN", "CREATININE", "CALCIUM" in the last 72 hours.   Intake/Output Summary (Last 24 hours) at 11/18/2021 0902 Last data filed at 11/18/2021 0820 Gross per 24 hour  Intake 840 ml  Output 1700 ml  Net -860 ml        Physical Exam:   General: awake, alert, appropriate, sitting up in w/c with OTA in room; grooming self; NAD HENT: conjugate gaze; oropharynx dry- hoarse throat sounding CV: regular rate; no JVD Pulmonary: CTA B/L; no W/R/R- good air movement GI: soft, NT, ND, (+)BS Psychiatric: appropriate Neurological: Ox3  Psych: pleasant and cooperative  Neurological: Ox4, follows commands Genitourinary:Golden yellow urine in bag, minimal sediment   Musculoskeletal: L shoulder K tape in place- shoulder still subluxed ~1"    Cervical back: Neck supple.   Moving all 4 extremities today; prior exams:  RUE- biceps 5/5; WE 4-/5; Triceps 2+/5; Grip 3-/5; FA 2-/5 LUE- biceps 5-/5; WE 4/5; Triceps 2+/5' Grip 3+/5; FA 2-/5 RLE- 1to 1+/5 HE, KE and trace at anke LLE- HF 1+/5; KE -/5; DF 1/5; PF 2/5; EHL 1/5 Motor exam consistent with above Decreased to light touch from L1 inferiorly       Assessment/Plan: 1. Functional deficits which require 3+ hours per day of interdisciplinary therapy in a comprehensive inpatient rehab setting. Physiatrist is providing close team supervision and 24 hour management of active medical problems  listed below. Physiatrist and rehab team continue to assess barriers to discharge/monitor patient progress toward functional and medical goals  Care Tool:  Bathing    Body parts bathed by patient: Left upper leg, Front perineal area, Abdomen, Chest, Left arm, Face   Body parts bathed by helper: Front perineal area, Right arm, Buttocks, Right upper leg, Right lower leg, Left lower leg     Bathing assist Assist Level: Moderate Assistance - Patient 50 - 74%     Upper Body Dressing/Undressing Upper body dressing   What is the patient wearing?: Pull over shirt    Upper body assist Assist Level: Minimal Assistance - Patient > 75%    Lower Body Dressing/Undressing Lower body dressing      What is the patient wearing?: Pants     Lower body assist Assist for lower body dressing: Total Assistance - Patient < 25%     Toileting Toileting    Toileting assist Assist for toileting: Dependent - Patient 0%     Transfers Chair/bed transfer  Transfers assist  Chair/bed transfer activity did not occur: Safety/medical concerns  Chair/bed transfer assist level: 2 Helpers     Locomotion Ambulation   Ambulation assist   Ambulation activity did not  occur: Safety/medical concerns          Walk 10 feet activity   Assist  Walk 10 feet activity did not occur: Safety/medical concerns        Walk 50 feet activity   Assist Walk 50 feet with 2 turns activity did not occur: Safety/medical concerns         Walk 150 feet activity   Assist Walk 150 feet activity did not occur: Safety/medical concerns         Walk 10 feet on uneven surface  activity   Assist Walk 10 feet on uneven surfaces activity did not occur: Safety/medical concerns         Wheelchair     Assist Is the patient using a wheelchair?: Yes Type of Wheelchair: Power Wheelchair activity did not occur: Safety/medical concerns  Wheelchair assist level: Supervision/Verbal cueing Max  wheelchair distance: >300    Wheelchair 50 feet with 2 turns activity    Assist    Wheelchair 50 feet with 2 turns activity did not occur: Safety/medical concerns   Assist Level: Supervision/Verbal cueing   Wheelchair 150 feet activity     Assist  Wheelchair 150 feet activity did not occur: Safety/medical concerns   Assist Level: Supervision/Verbal cueing   Blood pressure 136/72, pulse 66, temperature 98.1 F (36.7 C), temperature source Oral, resp. rate 16, height 5' 9" (1.753 m), weight 99.9 kg, SpO2 95 %.  Medical Problem List and Plan: 1. 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  Antithrombotics: -DVT/anticoagulation:  Pharmaceutical: Lovenox.         9/13- spoke with Oncology- agree with me that needs Eliquis for a total of 3 months, even though DVT is gone, is at very high risk due to CA and new SCI.              -antiplatelet therapy: ASA 3. Pain: continue Oxycodone prn. 8/28- pt reports no significant pain- and doesn't want spasticity meds at this time. 9/25- sore throat- will try magic mouthwash 4. Mood/Behavior/Sleep: LCSW to follow for evaluation and support.              -antipsychotic agents: N/A 5. Neuropsych/cognition: This patient is capable of making decisions on his own behalf. 6. Skin/Wound Care: Routine pressure relief measures              --monitor incision for healing.  7. Fluids/Electrolytes/Nutrition: Monitor I/O. Check CMET in am.  8. Prostate AdenoCA w/mets: Loading dose Firmagon 240 mg 10/06/21 per Dr. Tasia Catchings --Charlesetta Garibaldi changed to [facility] due to insurance coverage--next dose around 09/13. --Simulation for XRT 08/31  at 2 pm at [facility] 9/6- has appt 10/11 for Eliguard and MD f/u.  9/13- actually XRT will stop 9/20 per Oncology.  9/21 Finished radiation treatment 9/20, f/u with Dr. Tasia Catchings 9. Dysphagia: pt now on regular/thins 10. Neurogenic bladder: continue foley for now             8/31- pt doesn't want to remove to  see if can void; or to do in/out caths- thinks too much stress on family.              9/3- still has some brownish  urine- see through. 9/7 urine generally clear. May have some periodic blood d/t anticoagulation 9/20 foley replaced yesterday  11. Neurogenic bowel: Will get KUB to determine stool burden --continue Colace in am with suppository after supper.  8/25- had good results with bowel program 8/27- good results with bowel program yesterday 8/28- having bowel accidents- and BM with bowel program last 3 nights-  8/29- 2 small BM's last night after bowel program- con't regimen 9/2-9/3 Having results with bowel program nightly           9/5- Didn't have results last night- but had before- will con't to monitor 9/8 pt had large, timely result with fleet enema last night. Continue thru weekend and perhaps convert back to suppository Monday.  9/11 Had large BM with enema, will continue tonight, consider restart suppository tomorrow 9/12- will change back to dulcolax supp as of tonight- still need dig stim- restarted bowel program             9/13- still having bowel accidents, but less frequent             9/14- wife to learn bowel program this evening- educated that needs to do nightly to get him on regimen- takes ~ 6 weeks to train gut 9/15 BM with dig stim overnight. Per patient preference, will keep PM bowel program.  9/23 LBM 9/22 with program, regular BMS, continue current regimen 12. Sepsis/Likely aspiration PNA: Now on Augmentin--antibiotic D#5/10. BC pend-->neg so far.              --Follow up CXR in am.              --pulmonary hygiene for atelectasis/Asymmetric volume loss on right.   13. Abnormal LFTs: Improved overall except for elevation in Aphos--315. Will recheck in am             8/25- Alk phos up to 457; AST 62 and ALT 96- except for alk phos, they are heading downwards- con't to monitor weekly.   9/21 Recheck Monday, ordered 14. ABLA: improving, monitor weekly   Up to HGB 11.3 on 9/21 9/25- Hb 11.5- doing better  15. Leucocytosis: Recheck in am. Monitor for fevers/other signs of infection.               8/25- WBC 17.1k- on Augmentin- will recheck labs in AM             -WBC improved to 15.6 10/19/21             8/28- Down to 10.8k- con't to monitor             8/31- WBC 9.5k- doing better            9/3 - 7.9--->4.6 on 9/9, improved  -WBC 5.3 on 9/21  9/25- WBC 4.8  16. Hypotension: continue midodrine.              9/11 BP has been stable, denies symptoms, continue midodrine   9/22 Well controlled, intermittently elevated, continue midodrine for now  9/25- Will hold Midodrine if BP >140 beofre taking meds 17. DVT left gastrocnemius vein             -8/26 Noted Lovenox was increased 8/25 to 148m BID, Distal DVT, will recheck in 1-2 weeks, compression stockings,  9/6- needs to go home on Tx lovenox- will need a minimum of 3 months due to DVT- cannot switch to Eliquis, etc since cancer related DVTs need Lovenox!             9/12- New Dopplers show no DVT- will d/w Oncology the plan since no DVT now, HOWEVER did have one and Cancer dramatically increases his risk.              9/13- put on Eliquis per Oncology for a minimum of 3 months  18. Spasticity             8/28- pt doesn't want spasticity meds- isn't painful or annoying.              9/6- Spasticity somewhat better today             9/14- educated can get worse for up to 2 years 19. Dispo             8/28- will d/c staples and see if PA can stop appointments at [facility]- only at [facility]. 8/29- need to see about Mills Koller -supposed to be given 9/12- Oncology to handle it- L/M/via PA about  this.  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 Left hand PIP digit swelling: placed OT order for kinesiotaping and nursing order for ice application-improved 21. Post nasal drip             9/13- will add Flonase daily for cough related to nasal drip-             9/14- helping wishes had started "years ago".   22. Flu shot- received 9/15   23. Sore throat- will try magic mouthwash- lidocaine viscous is on backorder, so cannot add that.   LOS: 32 days A FACE TO FACE EVALUATION WAS PERFORMED  Eliezer Khawaja 11/18/2021, 9:02 AM

## 2021-11-18 NOTE — Progress Notes (Signed)
Occupational Therapy Session Note  Patient Details  Name: Jason [name] MRN: [id number] Date of Birth: 04-11-1955  Today's Date: 11/18/2021 OT Individual Time: 0700-0800 OT Individual Time Calculation (min): 60 min    Short Term Goals: Week 5:  OT Short Term Goal 1 (Week 5): STG=LTG 2/2 ELOS  Skilled Therapeutic Interventions/Progress Updates:    OT intervention with focus on dressing tasks at bed level, bed mobility, SB transfer, grooming, and PWC mobility. With HOB elevated, pt able to position into long sitting to doff pull over shirt with min A. Pt dons shirt with min A. After pants threaded over BLE, pt able to pull pants up to hips. Rolling R/L in bed using bed rails with min A to facilitate OTA pulling pants over hips. Supine>sit EOB with max A. SB transfer to [name] with max A+1. Pt completed grooming tasks with setup seated in PWC at sink. Pt positionied PWC beside bed when session completed. All needs within reach.   Therapy Documentation Precautions:  Precautions Precautions: Fall, Cervical Precaution Comments: C6 laminectomy, MAP >65 Restrictions Weight Bearing Restrictions: No RUE Weight Bearing: Weight bearing as tolerated Pain:  Pt reports his throat is still sore; MD aware  Therapy/Group: Individual Therapy  Leroy Libman 11/18/2021, 3:00 PM

## 2021-11-18 NOTE — Progress Notes (Signed)
Occupational Therapy Discharge Summary  Patient Details  Name: Jason [name] MRN: [id number] Date of Birth: [birth year]  Date of Discharge from OT service:November 18, 2021  Patient has met 9 of 11 long term goals due to {due to:3041651}.  Pt made steady progress with BADLs and SB tranfsers during this admission. Pt completes bathing/dressing tasks at bed level: min A for UB and max A for LB. Pt able to maintain long sitting at bed level with CGA statically and with min A dynamically. SB transfers with max A+1. Transfers at discharge will be with hoyer lift. Pt's wife has dmeonstrated proficiency using hoyer lift and directing 2nd person to assist. Pt has acquired a rolling shower chair to use at home. Pt's bathroom is being modified to accommodate equipment. Pt is independent with directing care. Patient to discharge at overall {LOA:3049010} level.  Patient's care partner {care partner:3041650} to provide the necessary {assistance:3041652} assistance at discharge.    Reasons goals not met: Pt requires max A for LB dressing (LTG=mod A); Pt dependent for shower tranfsers (LTG=max A)  Recommendation:  Patient will benefit from ongoing skilled OT services in {setting:3041680} to continue to advance functional skills in the area of {ADL/iADL:3041649}.  Equipment: hoyer  Reasons for discharge: {Reason for discharge:3049018}  Patient/family agrees with progress made and goals achieved: {Pt/Family agree with progress/goals:3049020}  OT Discharge ADL ADL Equipment Provided: Dressing stick Eating: Set up Where Assessed-Eating: Wheelchair Grooming: Setup Where Assessed-Grooming: Wheelchair Upper Body Bathing: Minimal assistance Where Assessed-Upper Body Bathing: Shower Lower Body Bathing: Maximal assistance Where Assessed-Lower Body Bathing: Shower Upper Body Dressing: Minimal assistance Where Assessed-Upper Body Dressing: Bed level Lower Body Dressing: Maximal assistance Where  Assessed-Lower Body Dressing: Bed level Toileting: Dependent Where Assessed-Toileting: Bed level Toilet Transfer: Dependent Toilet Transfer Method: Other (comment) (hoyer) Tub/Shower Transfer: Unable to assess Social research officer, government: Dependent Social research officer, government Method: Other (comment) (hoyer with rolling shower chair) ADL Comments: Pt able to roll to B sides with maximal assistance to L and moderate assistance to R as pt is able to reach and pull with LUE to assist with rolling during ADL tasks. Pt demo's improved BUE strength and coordination as indicated by being able to participate in threading BUE through clothing. Pt and pt's spouse demo good accuracy and communication with directing care and spouse encourages pt to practice skills needed to direct care. Spouse very involved and supportive to completing tasks during OT session. Vision Baseline Vision/History: 1 Wears glasses Patient Visual Report: No change from baseline Perception  Perception: Within Functional Limits Praxis Praxis: Intact Cognition Cognition Overall Cognitive Status: Within Functional Limits for tasks assessed Arousal/Alertness: Awake/alert Orientation Level: Person;Place;Situation Person: Oriented Place: Oriented Situation: Oriented Memory: Appears intact Attention: Sustained Sustained Attention: Appears intact Awareness: Appears intact Problem Solving: Appears intact Safety/Judgment: Appears intact Brief Interview for Mental Status (BIMS) Repetition of Three Words (First Attempt): 3 Temporal Orientation: Year: Correct Temporal Orientation: Month: Accurate within 5 days Temporal Orientation: Day: Correct Recall: "Sock": Yes, no cue required Recall: "Blue": Yes, no cue required Recall: "Bed": Yes, no cue required BIMS Summary Score: 15 Sensation Sensation Light Touch: Appears Intact Hot/Cold: Appears Intact Proprioception: Impaired by gross assessment Stereognosis: Impaired by gross  assessment Coordination Gross Motor Movements are Fluid and Coordinated: No Fine Motor Movements are Fluid and Coordinated: No Coordination and Movement Description: llimited by tetraplegia Motor  Motor Motor: Clonus;Tetraplegia;Abnormal postural alignment and control Motor - Skilled Clinical Observations: Pt limited by quadriplegia and near constant spasms Motor - Discharge Observations:  Greatly improved spasms and improved motor control of RUE and BLE Mobility  Bed Mobility Rolling Right: Minimal Assistance - Patient > 75% Rolling Left: Supervision/Verbal cueing Supine to Sit: Moderate Assistance - Patient 50-74% Sit to Supine: Maximal Assistance - Patient 25-49%  Trunk/Postural Assessment  Cervical Assessment Cervical Assessment: Exceptions to [facility] Thoracic Assessment Thoracic Assessment: Exceptions to [facility] Lumbar Assessment Lumbar Assessment: Exceptions to [facility] Postural Control Postural Control: Deficits on evaluation Trunk Control: Impaired but improved since baseline Righting Reactions: slightly delayed but imroved since baseline Protective Responses: emerging  Balance Balance Balance Assessed: Yes Static Sitting Balance Static Sitting - Balance Support: Feet supported;Bilateral upper extremity supported Static Sitting - Level of Assistance: 5: Stand by assistance Dynamic Sitting Balance Dynamic Sitting - Balance Support: Feet supported;During functional activity Dynamic Sitting - Level of Assistance: 4: Min assist Extremity/Trunk Assessment RUE Assessment RUE Assessment: Exceptions to [facility] Passive Range of Motion (PROM) Comments: WFL's Active Range of Motion (AROM) Comments: sh 0-110 General Strength Comments: sh-4/5; elbow 4+/5, grasp-4/5 RUE Body System: Neuro RUE AROM (degrees) Overall AROM Right Upper Extremity: Deficits RUE Strength Right Hand Gross Grasp: Functional LUE Assessment LUE Assessment: Exceptions to [facility] Passive Range of Motion (PROM) Comments:  severely limited L sh due to failed repair with 0-45 degrees PROM sh General Strength Comments: 1/5 L sh, 2-/5 L distal  4/5 grasp LUE Strength Left Hand Gross Grasp: Functional   Leroy Libman 11/18/2021, 3:11 PM

## 2021-11-18 NOTE — Progress Notes (Signed)
Occupational Therapy Session Note  Patient Details  Name: Jason [name] MRN: [id number] Date of Birth: [birth year]  Today's Date: 11/18/2021 OT Individual Time: 1300-1355 OT Individual Time Calculation (min): 55 min    Short Term Goals: Week 5:  OT Short Term Goal 1 (Week 5): STG=LTG 2/2 ELOS  Skilled Therapeutic Interventions/Progress Updates:    Pt resting in PWC upon arrival. Pt maneuvered to day room. Kinesio tape reapplied to LUE/hand for edema mgmt. RUE therex with boccie ball and 3# wrist weight: shoulder presses and chest presses 3x10. Min A for shoulder presses. SB transfer back to bed with max A+1. +2 for sit>supine. Pt remained in bed with all needs within reach.    Therapy Documentation Precautions:  Precautions Precautions: Fall, Cervical Precaution Comments: C6 laminectomy, MAP >65 Restrictions Weight Bearing Restrictions: No RUE Weight Bearing: Weight bearing as tolerated Pain:  Pt denies pain this afternoon   Therapy/Group: Individual Therapy  Leroy Libman 11/18/2021, 2:54 PM

## 2021-11-18 NOTE — Progress Notes (Signed)
Physical Therapy Discharge Summary  Patient Details  Name: Jason [name] MRN: [id number] Date of Birth: 1956/01/02  Date of Discharge from PT service:November 18, 2021  Today's Date: 11/18/2021 PT Individual Time: 1015-1120 PT Individual Time Calculation (min): 65 min    Patient has met 5 of 6 long term goals due to improved activity tolerance, improved balance, improved postural control, increased strength, increased range of motion, and ability to compensate for deficits.  Patient to discharge at a wheelchair level Max Assist.   Patient's care partner is independent to provide the necessary physical assistance at discharge. Pt to d/c home with his wife Jason [name] who has undergone hands on family education. Both the pt and Jason [name] are able to direct caregiving as needed. Pt to d/c home with a PWC, slideboard, and hoyer lift. Pt performs slideboard transferi with max A and although family education provided, this transfer requires skilled cueing or +2 for safety. Pt plans to use hoyer lift at home. Pt is independent with PWC mobility and pressure relief.   Reasons goals not met: Pt did not meet bed/chair transfer goal as he still requires max A to transfer with slide board. Pt is d/c with a hoyer lift for safer transfers with his wife at home.   Recommendation:  Patient will benefit from ongoing skilled PT services in home health setting to continue to advance safe functional mobility, address ongoing impairments in strength, transfers, balance, and minimize fall risk.  Equipment: PWC through [employer]  Reasons for discharge: treatment goals met and discharge from hospital  Patient/family agrees with progress made and goals achieved: Yes  Skilled Therapeutic Interventions/Progress Updates:  Pt received in [place], agreeable to therapy. No complaint of pain. Pt navigates PWC independently to/from gym this session. Pt performed slideboard transfer x 3 with light max A fading to tot A by end of  session d/t fatigue. Pt able to achieve static sitting balance with BUE support on edge of table x 1 min with supervision. Pt then performed sit ups to increasingly reclined position with CGA, min A for occasional support. Pt able to successfully use momentum and breath with minimal cueing, and maintain sitting balance without anterior LOB. After returning to [facility] and repositioning with tot A, discussed support groups and provided hand outs on resources/support groups. Pt slightly resistant but agreeable to receiving hand out. Pt returned to room and remained in [place]. Pt reports all questions answered and ready for d/c tomorrow.   PT Discharge Precautions/Restrictions Precautions Precaution Comments: C6 laminectomy, MAP >65 Restrictions Weight Bearing Restrictions: No  Pain Interference Pain Interference Pain Effect on Sleep: 2. Occasionally Pain Interference with Therapy Activities: 1. Rarely or not at all Pain Interference with Day-to-Day Activities: 4. Almost constantly Vision/Perception  Perception Perception: Within Functional Limits Praxis Praxis: Not tested  Cognition Overall Cognitive Status: Within Functional Limits for tasks assessed Arousal/Alertness: Awake/alert Orientation Level: Oriented X4 Year: 2023 Month: September Day of Week: Correct Safety/Judgment: Appears intact Sensation Sensation Light Touch: Impaired Detail Peripheral sensation comments: diminished BLE, but improved from baseline per pt report Additional Comments: Pt with diminished sensation from lumbar dermatomes down, with patches of absent sensation, + proprio, hot/cold B UE's Coordination Gross Motor Movements are Fluid and Coordinated: No Coordination and Movement Description: Pt limited by quadriplegia Motor  Motor Motor: Clonus;Tetraplegia;Abnormal postural alignment and control Motor - Skilled Clinical Observations: Pt limited by quadriplegia and near constant spasms Motor - Discharge  Observations: Greatly improved spasms and improved motor control of RUE and BLE  Mobility Bed Mobility Rolling Right: Minimal Assistance - Patient > 75% Rolling Left: Supervision/Verbal cueing Supine to Sit: Moderate Assistance - Patient 50-74% Sit to Supine: Maximal Assistance - Patient 25-49% Transfers Transfers: Lateral/Scoot Transfers Lateral/Scoot Transfers: Maximal Assistance - Patient 25-49% Transfer (Assistive device):  (slide board) Locomotion  Gait Ambulation: Yes Gait Assistance: 2 Holiday representative (Feet): 30 Feet Assistive device:  (lite gait) Gait Gait: No Stairs / Additional Locomotion Stairs: No Pick up small object from the floor (from standing position) activity did not occur: Safety/medical concerns Architect: Yes Wheelchair Assistance: Independent with Camera operator: Power Wheelchair Parts Management: Independent Distance: >1000 ft  Trunk/Postural Assessment  Cervical Assessment Cervical Assessment: Exceptions to [facility] Thoracic Assessment Thoracic Assessment: Exceptions to [facility] Lumbar Assessment Lumbar Assessment: Exceptions to [facility] Postural Control Postural Control: Deficits on evaluation Trunk Control: Impaired but improved since baseline Righting Reactions: slightly delayed but imroved since baseline Protective Responses: emerging  Balance Balance Balance Assessed: Yes Static Sitting Balance Static Sitting - Balance Support: Feet supported;Bilateral upper extremity supported Static Sitting - Level of Assistance: 5: Stand by assistance Dynamic Sitting Balance Dynamic Sitting - Balance Support: Feet supported;During functional activity Dynamic Sitting - Level of Assistance: 4: Min assist Extremity Assessment      RLE Assessment RLE Assessment: Exceptions to [facility] General Strength Comments: MMT in sitting, 2+ hip/knee/ankle, 4-/5 plantar flexion LLE Assessment LLE Assessment: Exceptions to  [facility] General Strength Comments: Assessed in sitting, 3+/5 hip ankle/knee, 4-/5 plantar flexion   Jason [name] C Anissia Wessells 11/18/2021, 12:44 PM

## 2021-11-19 ENCOUNTER — Other Ambulatory Visit (HOSPITAL_COMMUNITY): Payer: Self-pay

## 2021-11-19 ENCOUNTER — Encounter: Payer: Self-pay | Admitting: Neurosurgery

## 2021-11-19 MED ORDER — ASCORBIC ACID 500 MG PO TABS
500.0000 mg | ORAL_TABLET | Freq: Every day | ORAL | 0 refills | Status: AC
  Filled 2021-11-19: qty 30, 30d supply, fill #0

## 2021-11-19 MED ORDER — BISACODYL 10 MG RE SUPP: 10.0000 mg | Freq: Every day | RECTAL | 0 refills | Status: DC | PRN

## 2021-11-19 MED ORDER — POLYETHYLENE GLYCOL 3350 17 G PO PACK: 17.0000 g | PACK | Freq: Every day | ORAL | 0 refills | Status: DC | PRN

## 2021-11-19 MED ORDER — TAMSULOSIN HCL 0.4 MG PO CAPS
0.4000 mg | ORAL_CAPSULE | Freq: Every day | ORAL | 0 refills | Status: DC
  Filled 2021-11-19: qty 30, 30d supply, fill #0

## 2021-11-19 MED ORDER — DOCUSATE SODIUM 100 MG PO CAPS: 200.0000 mg | ORAL_CAPSULE | Freq: Every day | ORAL | 0 refills | Status: DC

## 2021-11-19 MED ORDER — APIXABAN 5 MG PO TABS
5.0000 mg | ORAL_TABLET | Freq: Two times a day (BID) | ORAL | 0 refills | Status: DC
  Filled 2021-11-19: qty 60, 30d supply, fill #0

## 2021-11-19 MED ORDER — FAMOTIDINE 20 MG PO TABS
20.0000 mg | ORAL_TABLET | Freq: Every day | ORAL | 0 refills | Status: DC
  Filled 2021-11-19: qty 30, 30d supply, fill #0

## 2021-11-19 MED ORDER — FLUTICASONE PROPIONATE 50 MCG/ACT NA SUSP
1.0000 | Freq: Every day | NASAL | 0 refills | Status: DC
  Filled 2021-11-19: qty 16, 30d supply, fill #0

## 2021-11-19 MED ORDER — MIDODRINE HCL 5 MG PO TABS
5.0000 mg | ORAL_TABLET | Freq: Three times a day (TID) | ORAL | 0 refills | Status: DC
  Filled 2021-11-19: qty 90, 30d supply, fill #0

## 2021-11-19 MED ORDER — LEVOTHYROXINE SODIUM 88 MCG PO TABS
88.0000 ug | ORAL_TABLET | Freq: Every day | ORAL | 0 refills | Status: DC
  Filled 2021-11-19: qty 30, 30d supply, fill #0

## 2021-11-19 MED ORDER — POTASSIUM CHLORIDE CRYS ER 10 MEQ PO TBCR
10.0000 meq | EXTENDED_RELEASE_TABLET | Freq: Every day | ORAL | 0 refills | Status: DC
  Filled 2021-11-19: qty 30, 30d supply, fill #0

## 2021-11-19 MED ORDER — POLY-IRON 150 FORTE 150-25-1 MG-MCG-MG PO CAPS
150.0000 mg | ORAL_CAPSULE | Freq: Every day | ORAL | 0 refills | Status: DC
  Filled 2021-11-19: qty 30, 30d supply, fill #0

## 2021-11-19 MED ORDER — ACETAMINOPHEN 325 MG PO TABS: 325.0000 mg | ORAL_TABLET | ORAL | Status: DC | PRN

## 2021-11-19 MED ORDER — CLONAZEPAM 0.5 MG PO TABS
0.5000 mg | ORAL_TABLET | Freq: Two times a day (BID) | ORAL | 0 refills | Status: DC
  Filled 2021-11-19: qty 60, 30d supply, fill #0

## 2021-11-19 MED ORDER — FLUOXETINE HCL 20 MG PO CAPS
20.0000 mg | ORAL_CAPSULE | Freq: Every day | ORAL | 0 refills | Status: DC
  Filled 2021-11-19 (×2): qty 30, 30d supply, fill #0

## 2021-11-19 MED ORDER — ATORVASTATIN CALCIUM 40 MG PO TABS
40.0000 mg | ORAL_TABLET | Freq: Every day | ORAL | 0 refills | Status: DC
  Filled 2021-11-19: qty 30, 30d supply, fill #0

## 2021-11-19 MED ORDER — MELOXICAM 15 MG PO TABS
15.0000 mg | ORAL_TABLET | Freq: Every day | ORAL | 0 refills | Status: DC
  Filled 2021-11-19: qty 30, 30d supply, fill #0

## 2021-11-19 MED ORDER — OXYCODONE-ACETAMINOPHEN 5-325 MG PO TABS
1.0000 | ORAL_TABLET | Freq: Four times a day (QID) | ORAL | 0 refills | Status: DC | PRN
  Filled 2021-11-19: qty 30, 4d supply, fill #0

## 2021-11-19 NOTE — Progress Notes (Signed)
Inpatient Rehabilitation Discharge Medication Review by a Pharmacist  A complete drug regimen review was completed for this patient to identify any potential clinically significant medication issues.  High Risk Drug Classes Is patient taking? Indication by Medication  Antipsychotic No   Anticoagulant Yes Eliquis - DVT  Antibiotic No   Opioid Yes Percocet PO - moderate/severe pain  Antiplatelet Yes ASA - stroke ppx  Hypoglycemics/insulin No   Vasoactive Medication Yes Midodrine - hypotension  Chemotherapy No   Other Yes Atorvastatin - HLD Synthroid - hypothyroidism Zyrtec - allergies Prozac, Clonazepam - mood Tamsulosin - neurogenic bladder Meloxicam - pain Iron, Potassium - suppl Famotidine - GERD     Type of Medication Issue Identified Description of Issue Recommendation(s)  Drug Interaction(s) (clinically significant)     Duplicate Therapy     Allergy     No Medication Administration End Date     Incorrect Dose     Additional Drug Therapy Needed     Significant med changes from prior encounter (inform family/care partners about these prior to discharge).    Other       Clinically significant medication issues were identified that warrant physician communication and completion of prescribed/recommended actions by midnight of the next day:  No  Name of provider notified for urgent issues identified:   Provider Method of Notification:   Pharmacist comments:   Time spent performing this drug regimen review (minutes):  [place], PharmD, BCPS 11/19/2021 9:04 AM

## 2021-11-19 NOTE — Discharge Summary (Signed)
Physician Discharge Summary  Patient ID: [name] MRN: [id number] DOB/AGE: 01-Jul-1955 66 y.o.  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 [facility]) Active Problems:   Acute incomplete quadriplegia (HCC) DVT prophylaxis/left gastrocnemius vein DVT Neurogenic bladder Neurogenic bowel Prostate adenocarcinoma with mets Sepsis Acute blood loss anemia Hypotension Hypothyroidism Mood stabilization  Discharged Condition: Stable  Significant Diagnostic Studies: VAS [place] LOWER EXTREMITY VENOUS (DVT)  Result Date: 11/04/2021  Lower Venous DVT Study Patient Name:  Jason [name]  Date of Exam:   11/04/2021 Medical Rec #: [id number]         Accession #:    [id number] Date of Birth: 1955/07/04         Patient Gender: M Patient Age:   66 years Exam Location:  [facility] Procedure:      VAS [place] LOWER EXTREMITY VENOUS (DVT) Referring Phys: Jennye Boroughs --------------------------------------------------------------------------------  Indications: Follow up left gastrocnemius clot.  Comparison Study: 10-18-2021 Prior bilateral lower extremity venous showed acute                   gastrocnemius DVT left. Performing Technologist: Darlin Coco RDMS, RVT  Examination Guidelines: A complete evaluation includes B-mode imaging, spectral Doppler, color Doppler, and power Doppler as needed of all accessible portions of each vessel. Bilateral testing is considered an integral part of a complete examination. Limited examinations for reoccurring indications may be performed as noted. The reflux portion of the exam is performed with the patient in reverse Trendelenburg.  +-----+---------------+---------+-----------+----------+--------------+ RIGHTCompressibilityPhasicitySpontaneityPropertiesThrombus Aging +-----+---------------+---------+-----------+----------+--------------+ CFV  Full            Yes      Yes                                 +-----+---------------+---------+-----------+----------+--------------+   +---------+---------------+---------+-----------+----------+--------------+ LEFT     CompressibilityPhasicitySpontaneityPropertiesThrombus Aging +---------+---------------+---------+-----------+----------+--------------+ CFV      Full           Yes      Yes                                 +---------+---------------+---------+-----------+----------+--------------+ SFJ      Full                                                        +---------+---------------+---------+-----------+----------+--------------+ FV Prox  Full                                                        +---------+---------------+---------+-----------+----------+--------------+ FV Mid   Full                                                        +---------+---------------+---------+-----------+----------+--------------+ FV DistalFull                                                        +---------+---------------+---------+-----------+----------+--------------+  PFV      Full                                                        +---------+---------------+---------+-----------+----------+--------------+ POP      Full           Yes      Yes                                 +---------+---------------+---------+-----------+----------+--------------+ PTV      Full                                                        +---------+---------------+---------+-----------+----------+--------------+ PERO     Full                                                        +---------+---------------+---------+-----------+----------+--------------+ Gastroc  Full                                                        +---------+---------------+---------+-----------+----------+--------------+    Summary: RIGHT: - No evidence of common femoral vein obstruction.  LEFT: -  There is no evidence of deep vein thrombosis in the lower extremity.  - No cystic structure found in the popliteal fossa.  *See table(s) above for measurements and observations. Electronically signed by Monica Martinez MD on 11/04/2021 at 4:19:13 PM.    Final     Labs:  Basic Metabolic Panel: Recent Labs  Lab 11/18/21 0854  NA 139  K 3.6  CL 104  CO2 27  GLUCOSE 131*  BUN 13  CREATININE 0.53*  CALCIUM 8.8*    CBC: Recent Labs  Lab 11/14/21 0711 11/17/21 0806  WBC 5.3 4.8  HGB 11.3* 11.5*  HCT 35.0* 34.4*  MCV 98.3 97.5  PLT 219 219    CBG: No results for input(s): "GLUCAP" in the last 168 hours.  Family history.  Mother with hypertension CVA and gout.  Negative for colon cancer esophageal cancer or rectal cancer  Brief HPI:   Jason [name] is a 66 y.o. right-handed male with history of hypertension chronic neck pain, OA status post left rotator cuff repair 3/23 with ongoing weakness tingling down first and second digit of left hand progressing to right upper extremity weakness.  He went to the ED for sedation prior to MRI C-spine 10/03/2021 for work-up showing abnormal marrow edema with diffuse involvement of C4 to thoracic spine marked canal stenosis greatest at C6, left greater than right foraminal and effacement due to extraosseous disease and concern for metastatic disease versus infiltrative marrow neoplastic process.  He presented to the ED on 10/04/2021 for further evaluation and abdominal pelvic CT done revealing moderate severity retroperitoneal and pelvic lymphadenopathy with metastatic disease, small lytic  areas of L4 and S1, right second and fourth ribs sacrum and left iliac bone.  He was taken to the OR on 8/13 by Dr. Cari Caraway for C6 cervical laminectomy and resection of tumor.  On the a.m. of 8/14 developed significant bilateral upper and lower extremity weakness with hypotension requiring transfer to the ICU, pressors as well as steroids for BP support.  He was  vented.  MRI C-spine revealed similar degree of epidural tumor C4-7 progressive canal stenosis.  MRI of the head showed Kabbe revealed metastatic disease in right temporal region and vertex with intracranial extension of tumor along the right frontal convexity without mass effect and extracranial extension of tumor to scalp along vertex and small focus in right parietal cortex felt to be small acute infarct.  MRI thoracic lumbar spine revealed extensive osseous metastatic disease throughout the thoracic the lumbar spine and pelvis, thin circumferential epidural tumor extending inferiorly and of the field of view to T3-T4, epidural tumor along posterior endplates T9 11 and 12 extensive epidural tumor lumbar spine greatest thickness at L1 without high-grade stenosis however superimposed on pre-existing degenerative changes in lumbar spine with severe canal stenosis cauda equina nerve root compression L3-L5 extra medullary fluid collection along dorsal aspect of cord beginning at T9-10 extending at the lumbar spine.  Dr.Yu hematology and oncology consults for input felt that patient with metastatic prostate cancer with PSA greater than 1500.  He was recommended Firmagon loading dose while awaiting final path as well as outpatient PSMA PET scan for work-up per Dr. Donella Stade with radiation oncology recommend starting palliative radiation therapy to cervical spine but delayed till Labor Day per Dr. Maryann Alar.  Dr. Quinn Axe with neurology consulted for input small parietal stroke seen on MRI recommend avoiding hypotension as well as low-dose aspirin.  Palliative care consulted to establish goals of care.  He developed fever with somnolence hypoxic respiratory failure tachycardia 8/19 IV antibiotics added due to concerns of aspiration pneumonia.  AKI was treated with IV fluids transfuse 1 unit packed red blood cells.  Monitoring of oxygen saturations.  He was cleared to begin Lovenox for DVT prophylaxis.  Speech therapy follow-up  for aspiration diet slowly advanced.  Therapy evaluations completed due to patient decreased functional mobility was admitted for a comprehensive rehab program.   Hospital Course: BOWDY BAIR was admitted to rehab 10/17/2021 for inpatient therapies to consist of PT, ST and OT at least three hours five days a week. Past admission physiatrist, therapy team and rehab RN have worked together to provide customized collaborative inpatient rehab.  Pertaining to patient's nontraumatic incomplete [place] C quadriplegia due to prostate metastasis.  Patient attending therapies.  Follow-up outpatient oncology services.  Hospital course Lovenox for DVT prophylaxis finding of left gastrocnemius vein DVT transition to Eliquis for 3 months.  In regards to patient's prostate cancer loading dose Firmagon noted 10/06/2021 XRT as advised finishing radiation treatments with follow-up per Dr.Yu.  Neurogenic bowel and bladder Foley tube for now full education provided and neurogenic bowel with education and family teaching.  Hospital course sepsis likely aspiration completing course of Augmentin.  Mood stabilization with use of Klonopin as well as Prozac.  Follow-up per neuropsychology.  Acute blood loss anemia stable latest hemoglobin 11.5.  Hypotension ProAmatine as directed.   Blood pressures were monitored on TID basis and soft and monitored     Rehab course: During patient's stay in rehab weekly team conferences were held to monitor patient's progress, set goals and discuss barriers to discharge.  At admission, patient required max assist upper body dressing +2 physical assist sit to supine  Physical exam.  Blood pressure 139/76 pulse 90 temperature 98.7 respiration 16 oxygen saturation 95% room air Constitutional.  No acute distress HEENT Head.  Normocephalic and atraumatic Eyes.  Pupils round and reactive to light no discharge without nystagmus Neck.  Supple nontender no JVD without thyromegaly.  Posterior neck  incision clean and dry Cardiac regular rate and rhythm without extra sounds or murmur heard Abdomen.  Soft nontender positive bowel sounds without rebound Respiratory effort normal no respiratory distress without wheeze Musculoskeletal Comments.  Right upper extremity biceps 5/5 wrist extension 4 -/5 triceps 2+/5 grip 3 -/5 FA 2 -/5 Left upper extremity biceps 5 -/5 wrist extension 4/5 triceps 2+/5 grip 3+/5 FA 2 -/5 Right lower extremity 0/5 right lower extremity Left lower extremity hip flexors 1/5 knee extension to minus/5 dorsiflexion 1/5 PF 2 -/5 EHL 1/5 Neurologic.  Decreased to light touch in L1 down to S1 B/L.  Noted spasm right lower extremity during exam.  Clonus sustained on right lower extremity and a few beats on left lower extremity  He/She  has had improvement in activity tolerance, balance, postural control as well as ability to compensate for deficits. He/She has had improvement in functional use RUE/LUE  and RLE/LLE as well as improvement in awareness.  Occupational Therapy focused on dressing task bed mobility sliding board transfers.  Patient able to position into long sitting to doff pullover shirt with minimal assist.  Don shirt with minimal assist.  After pants threaded over bilateral extremities able to pull pants over hips.  Sliding board transfers to power wheelchair with max assist.  Supine to sit edge of bed max assist.  Physical therapy sessions focused on [place] practice with wife.  Patient wife directed transfer without cueing from therapist.  Full family teaching completed plan discharge to home       Disposition: Discharge to home   Diet: Regular  Special Instructions: No smoking or alcohol  Routine change of Foley tube  Medications at discharge 1.  Tylenol as needed 2.  Eliquis 5 mg p.o. twice daily 3.  Vitamin C 500 mg p.o. daily 4.  Aspirin 81 mg p.o. daily 5.  Lipitor 40 mg p.o. daily 6.  Dulcolax suppository daily and as needed 7.  Klonopin 0.5 mg  p.o. twice daily 8.  Colace 200 mg p.o. daily 9.  Pepcid 20 mg p.o. daily 10.  Prozac 20 mg p.o. nightly 11.  Flonase 1 spray each nostril daily 12.  Niferex 150 mg p.o. daily 13.  Synthroid 88 mcg p.o. daily 14.  Claritin 10 mg p.o. twice daily 15.  Mobic 15 mg p.o. daily 16.  ProAmatine 5 mg p.o. 3 times daily 17.  Multivitamin daily 18.  Oxycodone 1 to 2 tablets every 6 hours as needed pain 19.  MiraLAX daily as needed 20.  Flomax 0.4 mg daily after supper 21.  Klor-Con 10 mEq p.o. daily   30-35 minutes were spent completing discharge summary and discharge planning  Discharge Instructions     Ambulatory referral to Physical Medicine Rehab   Complete by: As directed    Moderate complexity follow-up 1 to 2 weeks nontraumatic incomplete/Asia C quadriplegia        Follow-up Information     Venia Carbon, MD. Call.   Specialties: Internal Medicine, Pediatrics Why: Call in 1-2 days for post hospital follow up Contact information: [place]  [place] [address] [phone]         Nelva Bush, MD .   Specialty: Cardiology Contact information: [address] [place] [address] [phone]         Meade Maw, MD Follow up on 10/21/2021.   Specialty: Neurosurgery Why: Call in 1-2 days for post hospital follow up Contact information: [address] [place] [address] [phone]         Earlie Server, MD Follow up.   Specialty: Oncology Contact information: [place] [address] [phone]                 Signed: CORAN DIPAOLA 11/19/2021, 7:12 AM

## 2021-11-19 NOTE — Progress Notes (Signed)
PROGRESS NOTE   Subjective/Complaints:  Sore throat still- has some improvement with magic mouthwash Ready for d/c.    ROS:   Pt denies SOB, abd pain, CP, N/V/C/D, and vision changes   Objective:   No results found. Recent Labs    11/17/21 0806  WBC 4.8  HGB 11.5*  HCT 34.4*  PLT 219   Recent Labs    11/18/21 0854  NA 139  K 3.6  CL 104  CO2 27  GLUCOSE 131*  BUN 13  CREATININE 0.53*  CALCIUM 8.8*     Intake/Output Summary (Last 24 hours) at 11/19/2021 2542 Last data filed at 11/19/2021 0900 Gross per 24 hour  Intake 1062 ml  Output 2450 ml  Net -1388 ml        Physical Exam:    General: awake, alert, appropriate, lying in bed;  NAD HENT: conjugate gaze; oropharynx moist- no thrush seen CV: regular rate; no JVD Pulmonary: CTA B/L; no W/R/R- good air movement GI: soft, NT, ND, (+)BS Psychiatric: appropriate Neurological: Ox3  Psych: pleasant and cooperative  Neurological: Ox4, follows commands Genitourinary:Golden yellow urine in bag, minimal sediment   Musculoskeletal: L shoulder K tape in place- shoulder still subluxed ~1"    Cervical back: Neck supple.   Moving all 4 extremities today; prior exams:  RUE- biceps 5/5; WE 4-/5; Triceps 2+/5; Grip 3-/5; FA 2-/5 LUE- biceps 5-/5; WE 4/5; Triceps 2+/5' Grip 3+/5; FA 2-/5 RLE- 1to 1+/5 HE, KE and trace at anke LLE- HF 1+/5; KE -/5; DF 1/5; PF 2/5; EHL 1/5 Motor exam consistent with above Decreased to light touch from L1 inferiorly       Assessment/Plan: 1. Functional deficits which require 3+ hours per day of interdisciplinary therapy in a comprehensive inpatient rehab setting. Physiatrist is providing close team supervision and 24 hour management of active medical problems listed below. Physiatrist and rehab team continue to assess barriers to discharge/monitor patient progress toward functional and medical goals  Care  Tool:  Bathing    Body parts bathed by patient: Left arm, Chest, Abdomen, Front perineal area, Right upper leg, Left upper leg, Face   Body parts bathed by helper: Right arm, Buttocks, Right lower leg, Left lower leg     Bathing assist Assist Level: Moderate Assistance - Patient 50 - 74%     Upper Body Dressing/Undressing Upper body dressing   What is the patient wearing?: Pull over shirt    Upper body assist Assist Level: Minimal Assistance - Patient > 75%    Lower Body Dressing/Undressing Lower body dressing      What is the patient wearing?: Pants     Lower body assist Assist for lower body dressing: Maximal Assistance - Patient 25 - 49%     Toileting Toileting    Toileting assist Assist for toileting: Dependent - Patient 0%     Transfers Chair/bed transfer  Transfers assist  Chair/bed transfer activity did not occur: Safety/medical concerns  Chair/bed transfer assist level: Maximal Assistance - Patient 25 - 49%     Locomotion Ambulation   Ambulation assist   Ambulation activity did not occur: Safety/medical concerns  Assist level: 2 helpers Assistive device:  Lite Gait Max distance: 30 ft   Walk 10 feet activity   Assist  Walk 10 feet activity did not occur: Safety/medical concerns  Assist level: 2 helpers Assistive device: Lite Gait   Walk 50 feet activity   Assist Walk 50 feet with 2 turns activity did not occur: Safety/medical concerns         Walk 150 feet activity   Assist Walk 150 feet activity did not occur: Safety/medical concerns         Walk 10 feet on uneven surface  activity   Assist Walk 10 feet on uneven surfaces activity did not occur: Safety/medical concerns         Wheelchair     Assist Is the patient using a wheelchair?: Yes Type of Wheelchair: Power Wheelchair activity did not occur: Safety/medical concerns  Wheelchair assist level: Independent Max wheelchair distance: >1000 ft    Wheelchair  50 feet with 2 turns activity    Assist    Wheelchair 50 feet with 2 turns activity did not occur: Safety/medical concerns   Assist Level: Independent   Wheelchair 150 feet activity     Assist  Wheelchair 150 feet activity did not occur: Safety/medical concerns   Assist Level: Independent   Blood pressure 136/75, pulse 70, temperature 98.5 F (36.9 C), temperature source Oral, resp. rate 20, height $RemoveBe'5\' 9"'[id number]$  (1.753 m), weight 99.9 kg, SpO2 94 %.  Medical Problem List and Plan: 1. 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    2.  Antithrombotics: -DVT/anticoagulation:  Pharmaceutical: Lovenox.         9/13- spoke with Oncology- agree with me that needs Eliquis for a total of 3 months, even though DVT is gone, is at very high risk due to CA and new SCI.              -antiplatelet therapy: ASA 3. Pain: continue Oxycodone prn. 8/28- pt reports no significant pain- and doesn't want spasticity meds at this time. 9/25- sore throat- will try magic mouthwash 4. Mood/Behavior/Sleep: LCSW to follow for evaluation and support.              -antipsychotic agents: N/A 5. Neuropsych/cognition: This patient is capable of making decisions on his own behalf. 6. Skin/Wound Care: Routine pressure relief measures              --monitor incision for healing.  7. Fluids/Electrolytes/Nutrition: Monitor I/O. Check CMET in am.  8. Prostate AdenoCA w/mets: Loading dose Firmagon 240 mg 10/06/21 per Dr. Tasia Catchings --Charlesetta Garibaldi changed to [facility]  due to insurance coverage--next dose around 09/13. --Simulation for XRT 08/31 at 2 pm at [facility] 9/6- has appt 10/11 for Eliguard and MD f/u.  9/13- actually XRT will stop 9/20 per Oncology.  9/21 Finished radiation treatment 9/20, f/u with Dr. Tasia Catchings 9. Dysphagia: pt now  on regular/thins 10. Neurogenic bladder: continue foley for now             8/31- pt doesn't want to remove to  see if can void; or to do in/out caths- thinks too much stress on family.              9/3- still has some brownish  urine- see through. 9/7 urine generally clear. May have some periodic blood d/t anticoagulation 9/20 foley replaced yesterday  11. Neurogenic bowel: Will get KUB to determine stool burden --continue Colace in am with suppository after supper.  8/25- had good results with bowel program 8/27- good results with bowel program yesterday 8/28- having bowel accidents- and BM with bowel program last 3 nights-  8/29- 2 small BM's last night after bowel program- con't regimen 9/2-9/3 Having results with bowel program nightly           9/5- Didn't have results last night- but had before- will con't to monitor 9/8 pt had large, timely result with fleet enema last night. Continue thru weekend and perhaps convert back to suppository Monday.  9/11 Had large BM with enema, will continue tonight, consider restart suppository tomorrow 9/12- will change back to dulcolax supp as of tonight- still need dig stim- restarted bowel program             9/13- still having bowel accidents, but less frequent             9/14- wife to learn bowel program this evening- educated that needs to do nightly to get him on regimen- takes ~ 6 weeks to train gut 9/15 BM with dig stim overnight. Per patient preference, will keep PM bowel program.  9/23 LBM 9/22 with program, regular BMS, continue current regimen 12. Sepsis/Likely aspiration PNA: Now on Augmentin--antibiotic D#5/10. BC pend-->neg so far.              --Follow up CXR in am.               --pulmonary hygiene for atelectasis/Asymmetric volume loss on right.  13. Abnormal LFTs: Improved overall except for elevation in Aphos--315. Will recheck in am             8/25- Alk phos up to 457; AST 62 and ALT 96- except for alk phos, they are heading downwards- con't to monitor weekly.   9/21 Recheck Monday, ordered 14. ABLA: improving, monitor weekly   Up to HGB 11.3 on 9/21 9/25- Hb 11.5- doing better  15. Leucocytosis: Recheck in am. Monitor for fevers/other signs of infection.               8/25- WBC 17.1k- on Augmentin- will recheck labs in AM             -WBC improved to 15.6 10/19/21             8/28- Down to 10.8k- con't to monitor             8/31- WBC 9.5k- doing better            9/3 - 7.9--->4.6 on 9/9, improved  -WBC 5.3 on 9/21  9/25- WBC 4.8  16. Hypotension: continue midodrine.              9/11 BP has been stable, denies symptoms, continue midodrine   9/22 Well controlled, intermittently elevated, continue midodrine for now  9/25- Will hold Midodrine if BP >140 beofre taking meds 17. DVT left gastrocnemius vein             -  8/26 Noted Lovenox was increased 8/25 to 149m BID, Distal DVT, will recheck in 1-2 weeks, compression stockings,              9/12- New Dopplers show no DVT- will d/w Oncology the plan since no DVT now, HOWEVER did have one and Cancer dramatically increases his risk.              9/13- put on Eliquis per Oncology for a minimum of 3 months  18. Spasticity             8/28- pt doesn't want spasticity meds- isn't painful or annoying.              9/6- Spasticity somewhat better today             9/14- educated can get worse for up to 2 years 19. Dispo             8/28- will d/c staples and see if PA can [facility] 8/29- need to see about [facility]-supposed to be given 9/12- Oncology to handle it- L/M/via PA about this.  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 Left hand PIP digit swelling: placed OT order for kinesiotaping and nursing order for ice application-improved 21. Post nasal drip             9/13- will add Flonase daily for cough related to nasal drip-             9/14- helping wishes had started "years ago".   22. Flu shot- received 9/15   23. Sore throat- will try magic mouthwash- lidocaine viscous is on backorder, so cannot add that. 9/26- magic mouthwash somewhat helpful- wants to take home.   Will need Urology f/u as well- since has foley and has SCI    LOS: 33 days A FACE TO FACE EVALUATION WAS PERFORMED  Leatha Rohner 11/19/2021, 9:22 AM

## 2021-11-19 NOTE — Progress Notes (Signed)
Inpatient Rehabilitation Care Coordinator Discharge Note   Patient Details  Name: Jason [name] MRN: [id number] Date of Birth: 09-Nov-1955   Discharge location: D/c to home  Length of Stay: 32 days  Discharge activity level: w/c Max level Asst  Home/community participation: Limited  Patient response GU:YQIHKV Literacy - How often do you need to have someone help you when you read instructions, pamphlets, or other written material from your doctor or pharmacy?: Never  Patient response QQ:VZDGLO Isolation - How often do you feel lonely or isolated from those around you?: Never  Services provided included: MD, RD, OT, [name], RN, CM, SW, Pharmacy, TR, Neuropsych, PT  Financial Services:  Charity fundraiser Utilized: Herbalist and [place] offered to/list presented to: Yes  Follow-up services arranged:  Patient/Family has no preference for HH/DME agencies, Home Health, DME Home Health Agency: [facility] for HHPT/OT/[name]/SN    DME : [facility] for hoyer lift, hospital bed; [facility] for specialty w/c.    Patient response to transportation need: Is the patient able to respond to transportation needs?: Yes In the past 12 months, has lack of transportation kept you from medical appointments or from getting medications?: No In the past 12 months, has lack of transportation kept you from meetings, work, or from getting things needed for daily living?: No    Comments (or additional information):  Patient/Family verbalized understanding of follow-up arrangements:  Yes  Individual responsible for coordination of the follow-up plan: contact pt wife Joycelyn Schmid [phone]  Confirmed correct DME delivered: Rana Snare 11/19/2021    Rana Snare

## 2021-11-19 NOTE — Progress Notes (Signed)
PA provided discharge instructions and reviewed medications. Wife was provided medications from [facility] and obtained patient's belonging. Patient safely transported via [facility] to private resident.    Yehuda Mao, LPN

## 2021-11-20 ENCOUNTER — Telehealth: Payer: Self-pay

## 2021-11-20 NOTE — Telephone Encounter (Signed)
Spoke to pt's wife to see how he was doing. She said they are doing good. To be able to come to the office, she said they have to arrange transportation to come get him and bring him to the office. It may be best to a virtual visit if Dr Silvio Pate is wanting to see him.

## 2021-11-21 ENCOUNTER — Telehealth: Payer: Self-pay

## 2021-11-21 ENCOUNTER — Other Ambulatory Visit: Payer: Self-pay

## 2021-11-21 NOTE — Telephone Encounter (Signed)
Spoke to pt's wife. She would love to be able to do a home visit. She is aware they are routine follow-ups and not sick visits as needed. Advised Dr Silvio Pate is tentatively looking at [facility]. Oct 18th. He will contact her closer to the date.

## 2021-11-21 NOTE — Telephone Encounter (Signed)
Looks like rehab set him up with [facility] for HHPT/OT/[name]/SNl   Recommend the call [facility]. Number for [facility] office is  [phone]  If there is a problem, let us know and we can try to reorder.

## 2021-11-21 NOTE — Telephone Encounter (Signed)
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 She does not see a prior auth for [facility] PT.  His postop is not until 10/3. Can you go ahead and send a referral. You can try: [facility] [place] [phone] [place] [phone]

## 2021-11-22 NOTE — Telephone Encounter (Signed)
I spoke to [place] and she said that they were told [place] would call them but no one has called them yet. I gave her the number to [facility] and she will reach out to them. She is aware to call the office if there are any issues.

## 2021-11-26 ENCOUNTER — Encounter: Payer: Self-pay | Admitting: Oncology

## 2021-11-26 ENCOUNTER — Ambulatory Visit (INDEPENDENT_AMBULATORY_CARE_PROVIDER_SITE_OTHER): Payer: BC Managed Care – PPO | Admitting: Neurosurgery

## 2021-11-26 VITALS — BP 138/76 | HR 68 | Temp 98.4°F

## 2021-11-26 DIAGNOSIS — C61 Malignant neoplasm of prostate: Secondary | ICD-10-CM

## 2021-11-26 DIAGNOSIS — Z08 Encounter for follow-up examination after completed treatment for malignant neoplasm: Secondary | ICD-10-CM

## 2021-11-26 DIAGNOSIS — C7951 Secondary malignant neoplasm of bone: Secondary | ICD-10-CM

## 2021-11-26 NOTE — Progress Notes (Signed)
   DOS: 10/06/21 (C6 lami for tumor)  HISTORY OF PRESENT ILLNESS: 11/26/2021 [name] is status post the above surgery.  He was discharged to rehab and has made significant progress.  He is still wheelchair-bound but can now move his legs.  His right arm is very functional.  He is able to feed himself at this point.  PHYSICAL EXAMINATION:   Vitals:   11/26/21 1336  BP: 138/76  Pulse: 68  Temp: 98.4 F (36.9 C)   General: Patient is well developed, well nourished, calm, collected, and in no apparent distress.  NEUROLOGICAL:  General: In no acute distress.  Awake, alert, oriented to person, place, and time. Pupils equal round and reactive to light.   Strength: Side Biceps Triceps Deltoid Interossei Grip Wrist Ext. Wrist Flex.  R 4+ 4 4+ 4+ 4+ 4+ 4+  L 3 4- '2 4 4 4 4   '$ He is able to wiggle toes.  He has 3 out of 5 knee extension.  He has 3 out of 5 dorsiflexion.  Incision c/d/i with 1 minor area of scabbing  ROS (Neurologic): Negative except as noted above  IMAGING: No interval imaging to review   ASSESSMENT/PLAN:  Jason [name] is doing better after C6 laminectomy for prostate cancer tumor resection.  He had significant neurologic decline and has subsequently had substantial improvement.  I suspect he will continue to show improvement over the next 12 months or so.  He will start home health physical therapy soon.  The rehab group is arranging this for him.  I will see him back in 6 weeks.  We reviewed his activity limitations, but he is currently limited by his physical weakness rather than by the restrictions I would place.  I have encouraged him to continue therapy.      Meade Maw MD, [facility] Department of Neurosurgery

## 2021-11-27 ENCOUNTER — Telehealth: Payer: Self-pay

## 2021-11-27 NOTE — Telephone Encounter (Signed)
[name] RN with [facility] called seeking verbal approval  for home visits. When I called back [name] RN stated Jason [name] PCP has will okay the Home Health orders.

## 2021-11-29 ENCOUNTER — Telehealth: Payer: Self-pay | Admitting: Internal Medicine

## 2021-11-29 NOTE — Telephone Encounter (Signed)
Left message on VM for [facility]

## 2021-11-29 NOTE — Telephone Encounter (Signed)
D[place]anna from [facility] called to infrom Dr. Silvio Pate that the pt's spouse requested for the [facility] service to start to on 12/04/21. Call back # [phone]

## 2021-12-02 ENCOUNTER — Encounter: Payer: Self-pay | Admitting: Internal Medicine

## 2021-12-02 ENCOUNTER — Telehealth: Payer: Self-pay | Admitting: Registered Nurse

## 2021-12-02 ENCOUNTER — Telehealth (HOSPITAL_BASED_OUTPATIENT_CLINIC_OR_DEPARTMENT_OTHER): Payer: BC Managed Care – PPO | Admitting: Registered Nurse

## 2021-12-02 ENCOUNTER — Encounter: Payer: Self-pay | Admitting: Registered Nurse

## 2021-12-02 ENCOUNTER — Encounter: Payer: BC Managed Care – PPO | Attending: Registered Nurse | Admitting: Registered Nurse

## 2021-12-02 VITALS — BP 162/85 | HR 66 | Wt 201.8 lb

## 2021-12-02 DIAGNOSIS — C7940 Secondary malignant neoplasm of unspecified part of nervous system: Secondary | ICD-10-CM

## 2021-12-02 DIAGNOSIS — G8250 Quadriplegia, unspecified: Secondary | ICD-10-CM

## 2021-12-02 DIAGNOSIS — N319 Neuromuscular dysfunction of bladder, unspecified: Secondary | ICD-10-CM | POA: Insufficient documentation

## 2021-12-02 DIAGNOSIS — B370 Candidal stomatitis: Secondary | ICD-10-CM

## 2021-12-02 DIAGNOSIS — C61 Malignant neoplasm of prostate: Secondary | ICD-10-CM | POA: Insufficient documentation

## 2021-12-02 DIAGNOSIS — K592 Neurogenic bowel, not elsewhere classified: Secondary | ICD-10-CM | POA: Insufficient documentation

## 2021-12-02 MED ORDER — NYSTATIN 100000 UNIT/ML MT SUSP: 5.0000 mL | Freq: Four times a day (QID) | OROMUCOSAL | 0 refills | Status: DC

## 2021-12-02 NOTE — Progress Notes (Signed)
Subjective:    Patient ID: Jason [name], male    DOB: Dec 28, 1955, 66 y.o.   MRN: [id number]  HPI: Jason [name] is a 66 y.o. 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 He Presented to ED on 10/04/2021, for continued  chronic neck pain  Dr Sidney Ace H&P Note   Jason [name] is a 66 y.o. occasion male with medical history significant for essential hypertension, hypothyroidism, GERD and osteoarthritis, presented to the emergency room with continued chronic neck pain since last December and associated brief symptoms left hand thumb and index paresthesias with tingling and numbness without weakness.  He had a recent MRI under anesthesia on 8/10 at [facility] hat revealed multifocal abnormal marrow signal with relatively diffuse involvement of C4 into the thoracic spine with associated epidural disease, greatest at C6 where there is marked canal stenosis without cord compression.  There was left greater than right foraminal effacement due to extraosseous disease.  It was thought that this may reflect metastatic disease or marrow infiltrative neoplastic process.  The patient has been followed by spine specialist with [facility].  He denied any headache or dizziness or blurred vision.  He cannot lift his left arm above 90 degrees.  No nausea vomiting or abdominal pain.  No chest pain or cough or wheezing or dyspnea.  No dysuria, oliguria or hematuria or flank pain.  No other bleeding diathesis.  MRI Neck: 10/03/2021 IMPRESSION: Multifocal abnormal marrow signal with relatively diffuse involvement of C4 into the thoracic spine. Associated epidural disease, greatest at C6 where there is marked canal stenosis without cord compression. Left greater than right foraminal effacement due to extraosseous disease.   May reflect metastatic disease or marrow  infiltrative neoplastic process.  CT Chest: Abdomen: Pelvis  IMPRESSION: 1. Moderate severity retroperitoneal and pelvic lymphadenopathy, consistent with metastatic disease. 2. Small lytic areas at the levels of L4 and S1, with diffusely sclerotic changes involving the second and fourth right ribs, sacrum and left iliac bone. 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 Further evaluation with a whole body nuclear medicine bone scan is recommended. 3. 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 4. Sigmoid diverticulosis. 5. Aortic atherosclerosis.   Aortic Atherosclerosis (ICD10-I70.0).  CT Cervical Spine:  IMPRESSION: 1. Extensive heterogeneous sclerotic lesions throughout the cervical spine compatible with metastatic disease to the bone. 2. Mixed sclerotic and lytic changes in the posterior elements at C5 and C6. 3. Extraosseous tumor at C5 and C6 is better appreciated on the MRI scan. 4. No pathologic fractures. 5. Central and foraminal narrowing is greatest at C5-6 and C6-7 is described on the MRI scan.  Jason [name] underwent: on 10/06/2021: Dr Cari Caraway POSTERIOR CERVICAL FUSION/ FORAMINOTOMY LEVEL 3  MR Thoracic Spine IMPRESSION: 1. 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 2. 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 3. Additional epidural tumor along the posterior endplates at T9, F75, and T12 without high-grade spinal canal stenosis or cord compression. 4. Extensive epidural tumor in the lumbar spine is greatest in thickness at L1 without high-grade spinal canal stenosis at this level; however, superimposed on pre-existing degenerative changes lower in the lumbar spine results in severe spinal  canal stenosis with cauda  equina nerve root compression at L3-L4 and L4-L5. Multilevel neural foraminal stenosis is detailed above. 5. Postsurgical changes reflecting C6 laminectomy unchanged spinal canal stenosis compared to the postoperative study from 1 day prior. No definite cord signal abnormality. 6. Extramedullary fluid collection along the dorsal aspect of the cord beginning at T9-T10 extending into the lumbar spine is likely subdural in location but is of uncertain etiology; evolving blood products not excluded. The collection anteriorly displaces the cord and cauda equina nerve roots without frank cord compression or signal abnormality. There is no peripheral enhancement to suggest abscess. 7. Prevertebral edema in the cervical spine which may be postsurgical in nature. 8. New bilateral lower lobe consolidations may reflect atelectasis or developing pneumonia/aspiration  Hematology, Oncology  and Neurology was consulted.  MR Brain: IMPRESSION: 1. 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 There is intracranial extension of tumor along the right frontal convexity measuring up to 5 mm in thickness without mass effect on the underlying brain parenchyma or midline shift. Extracranial extension of tumor in this location measures up to approximately 3 mm. 2. Additional extracranial extension of tumor into the scalp along the vertex measuring up to 7 mm in thickness. 3. Small focus of diffusion restriction in the right parietal cortex most in keeping with a small acute infarct. 4. No evidence of parenchymal metastatic disease.  Jason [name] was admitted to inpatient rehabilitation on 10/17/2021 and discharged home on 11/19/2021. He is scheduled to have [facility] Initial Visit on on  Thursday 12/05/2021, per wife.  He states he has pain in his left shoulder. He rates his pain 1. Also reports he has a good appetite.   Wife  in room and all questions answered.   Pain Inventory Average Pain 1 Pain Right Now 1 My pain is dull  LOCATION OF PAIN  shoulder  BOWEL Number of stools per week: 7-8 Oral laxative use Yes  Type of laxative colace Enema or suppository use Yes  Incontinent Yes   BLADDER Foley    Mobility ability to climb steps?  no do you drive?  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  no  Physicians involved in your care Any changes since last visit?  no Willodean Rosenthal PCP sees in 2 weeks   Family History  Problem Relation Age of Onset   Hypertension Mother    Stroke Mother    Gout Father    Heart attack Father 25   Heart disease Sister    Colon cancer Neg Hx    Esophageal cancer Neg Hx    Rectal cancer Neg Hx    Stomach cancer Neg Hx    Social History   Socioeconomic History   Marital status: Married    Spouse name: [name]    Number of children: 0   Years of education: Not on file   Highest education level: Not on file  Occupational History   Occupation: Therapist, occupational: [employer]  Tobacco Use   Smoking status: Never   Smokeless tobacco: Never  Vaping Use   Vaping Use: Never used  Substance and Sexual Activity   Alcohol use: Not Currently  Comment: Alcohol once every few weeks/months   Drug use: No   Sexual activity: Yes  Other Topics Concern   Not on file  Social History Narrative   Lives at home with spouse.    Social Determinants of Health   Financial Resource Strain: Not on file  Food Insecurity: Not on file  Transportation Needs: Not on file  Physical Activity: Not on file  Stress: Not on file   Social Connections: Not on file   Past Surgical History:  Procedure Laterality Date   CARDIAC CATHETERIZATION  03/2000   HERNIA REPAIR  2956   umbilical   LEFT HEART CATH AND CORONARY ANGIOGRAPHY Left 03/20/2020   Procedure: LEFT HEART CATH AND CORONARY ANGIOGRAPHY;  Surgeon: Nelva Bush, MD;  Location: [place] [facility];  Service: Cardiovascular;  Laterality: Left;   POSTERIOR CERVICAL FUSION/FORAMINOTOMY N/A 10/06/2021   Procedure: POSTERIOR CERVICAL FUSION/ FORAMINOTOMY LEVEL 3;  Surgeon: Meade Maw, MD;  Location: [facility];  Service: Neurosurgery;  Laterality: N/A;  will need monitoring   RADIOLOGY WITH ANESTHESIA Left 04/18/2021   Procedure: MRI SHOULDER WITHOUT CONTRAST WITH ANESTHESIA;  Surgeon: Radiologist, Medication, MD;  Location: [facility];  Service: Radiology;  Laterality: Left;   RADIOLOGY WITH ANESTHESIA N/A 10/03/2021   Procedure: MRI CERVICAL SPINE WITH ANESTHESIA;  Surgeon: Radiologist, Medication, MD;  Location: [place];  Service: Radiology;  Laterality: N/A;   REVERSE SHOULDER ARTHROPLASTY Left    ROTATOR CUFF REPAIR  11/2010   Dr [name] Heck   TONSILLECTOMY     TOTAL HIP ARTHROPLASTY Right 02/24/2009   TOTAL HIP ARTHROPLASTY Left 06/2016   Dr Harlow Mares   Past Medical History:  Diagnosis Date   Allergy    Arthritis    osteoarthritis   GERD (gastroesophageal reflux disease)    Hypertension    Hypothyroidism    Renal calculi    Thyroid disease    Hyperthyroidism s/p radioactive iodine ablation   BP (!) 170/94 Comment: initial bp 158/100 rechecked  Pulse 66   Wt 201 lb 12.8 oz (91.5 kg) Comment: last recorded  SpO2 97%   BMI 29.80 kg/m   Opioid Risk Score:   Fall Risk Score:  `1  Depression screen PHQ 2/9     12/02/2021    1:20 PM 10/15/2020    3:41 PM 10/10/2019    3:10 PM 10/05/2018    7:54 AM 08/28/2017    8:28 AM  Depression screen PHQ 2/9  Decreased Interest 0 0 0 0 0  Down, Depressed, Hopeless 1 0 0 0 0  PHQ - 2 Score 1 0 0 0 0  Altered  sleeping 0      Tired, decreased energy 1      Change in appetite 0      Feeling bad or failure about yourself  0      Trouble concentrating 0      Moving slowly or fidgety/restless 0      Suicidal thoughts 0      PHQ-9 Score 2         Review of Systems  Constitutional: Negative.   HENT: Negative.    Eyes: Negative.   Respiratory: Negative.    Cardiovascular: Negative.   Gastrointestinal:        Bowel program  Endocrine: Negative.   Genitourinary:        Foley  Musculoskeletal:  Positive for gait problem.       Spasms  Skin: Negative.   Allergic/Immunologic: Negative.   Neurological:  Positive for  weakness.  Hematological:  Bruises/bleeds easily.       Apixiban  Psychiatric/Behavioral:  The patient is nervous/anxious.   All other systems reviewed and are negative.      Objective:   Physical Exam Vitals and nursing note reviewed.  Constitutional:      Appearance: Normal appearance.  Cardiovascular:     Rate and Rhythm: Normal rate and regular rhythm.  Pulmonary:     Effort: Pulmonary effort is normal.     Breath sounds: Normal breath sounds.  Musculoskeletal:     Cervical back: Normal range of motion and neck supple.     Comments: Normal Muscle Bulk and Muscle Testing Reveals:  Upper Extremities: Right: Decreased ROM 90 Degrees  and Muscle Strength 5/5 Left Upper Extremity: Decreased ROM 20 Degrees and Muscle Strength 2/5  Lower Extremities: Decreased ROM  Arrived in wheelchair    Skin:    General: Skin is warm and dry.  Neurological:     Mental Status: He is alert and oriented to person, place, and time.  Psychiatric:        Mood and Affect: Mood normal.        Behavior: Behavior normal.         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 Continue to Monitor.  , Acute Incomplete Quadriplegia,: He underwent on 10/06/2021 by Dr Cari Caraway:       Procedure Laterality Anesthesia  POSTERIOR  CERVICAL FUSION/ FORAMINOTOMY LEVEL 3 N/A General          Neurogenic Bladder and Neurogenic Bowel.: His wife is performing  Bladder and Bowel Program.  DVT: Continue Eliquis . PCP Following. Continue to Monitor.   Spent greater than 90 minutes with Mr and Mrs Pherigo, all questions answered.

## 2021-12-02 NOTE — Telephone Encounter (Signed)
Placed a call to Ms. Elijio Miles , She reports Mr. Rufo blood pressure at this time  is 165/ 94 pulse 64, Midodrine will be held.  She was instructed to check his blood pressure at 7:00 pm, this provider will call her at 8:00 pm. She verbalizes understanding. This provider spoke with Dr Tressa Busman  earlier today regarding his hypertension, she agreed with holding his dose and re-checking his blood pressure.Marland Kitchen  Spoke with Reesa Chew PA regarding Mr. Molock midodrine, when he was in the hospital, the last 4 days he was averaging receiving the Midodrine 1- 3 times a day, depending on his blood pressure. The parameters were to hold if SBP> 140 and DBP> 105.  The above parameters were given to Mrs. Devin Going, she will follow the above parameters and we will continue to monitor, she verbalizes understanding. She will send a My- Chart message in the morning with blood pressure reading, she verbalizes understanding.

## 2021-12-02 NOTE — Telephone Encounter (Signed)
Call Placed to Mrs. Jason [name],  She reports Jason [name] blood pressure is 160/93 and heart rate 77, we will continue to monitor. . Midodrine placed on hold. She will check his vitals and send My-Chart message in the morning, she verbalizes understanding.

## 2021-12-03 ENCOUNTER — Telehealth: Payer: Self-pay | Admitting: Registered Nurse

## 2021-12-03 DIAGNOSIS — D630 Anemia in neoplastic disease: Secondary | ICD-10-CM | POA: Diagnosis not present

## 2021-12-03 DIAGNOSIS — F419 Anxiety disorder, unspecified: Secondary | ICD-10-CM | POA: Diagnosis not present

## 2021-12-03 DIAGNOSIS — I200 Unstable angina: Secondary | ICD-10-CM | POA: Diagnosis not present

## 2021-12-03 DIAGNOSIS — D509 Iron deficiency anemia, unspecified: Secondary | ICD-10-CM | POA: Diagnosis not present

## 2021-12-03 DIAGNOSIS — I10 Essential (primary) hypertension: Secondary | ICD-10-CM | POA: Diagnosis not present

## 2021-12-03 DIAGNOSIS — M1990 Unspecified osteoarthritis, unspecified site: Secondary | ICD-10-CM | POA: Diagnosis not present

## 2021-12-03 DIAGNOSIS — E039 Hypothyroidism, unspecified: Secondary | ICD-10-CM | POA: Diagnosis not present

## 2021-12-03 DIAGNOSIS — C61 Malignant neoplasm of prostate: Secondary | ICD-10-CM | POA: Diagnosis not present

## 2021-12-03 DIAGNOSIS — C7940 Secondary malignant neoplasm of unspecified part of nervous system: Secondary | ICD-10-CM | POA: Diagnosis not present

## 2021-12-03 DIAGNOSIS — G8254 Quadriplegia, C5-C7 incomplete: Secondary | ICD-10-CM | POA: Diagnosis not present

## 2021-12-03 DIAGNOSIS — J9692 Respiratory failure, unspecified with hypercapnia: Secondary | ICD-10-CM | POA: Diagnosis not present

## 2021-12-03 DIAGNOSIS — I959 Hypotension, unspecified: Secondary | ICD-10-CM | POA: Diagnosis not present

## 2021-12-03 DIAGNOSIS — N400 Enlarged prostate without lower urinary tract symptoms: Secondary | ICD-10-CM | POA: Diagnosis not present

## 2021-12-03 DIAGNOSIS — K219 Gastro-esophageal reflux disease without esophagitis: Secondary | ICD-10-CM | POA: Diagnosis not present

## 2021-12-03 DIAGNOSIS — C7951 Secondary malignant neoplasm of bone: Secondary | ICD-10-CM | POA: Diagnosis not present

## 2021-12-03 DIAGNOSIS — J9691 Respiratory failure, unspecified with hypoxia: Secondary | ICD-10-CM | POA: Diagnosis not present

## 2021-12-03 NOTE — Telephone Encounter (Signed)
Call placed to [facility], to see what kind of dropper they dispense to [name]. They dispense a 1 ml dropper. Ms. Rodin was called and given directions for the Nystatin, she verbalizes understanding.

## 2021-12-04 ENCOUNTER — Telehealth: Payer: Self-pay | Admitting: Internal Medicine

## 2021-12-04 ENCOUNTER — Encounter: Payer: Self-pay | Admitting: Oncology

## 2021-12-04 ENCOUNTER — Inpatient Hospital Stay: Payer: BC Managed Care – PPO | Attending: Anatomic Pathology & Clinical Pathology

## 2021-12-04 ENCOUNTER — Ambulatory Visit: Payer: BC Managed Care – PPO

## 2021-12-04 ENCOUNTER — Inpatient Hospital Stay (HOSPITAL_BASED_OUTPATIENT_CLINIC_OR_DEPARTMENT_OTHER): Payer: BC Managed Care – PPO | Admitting: Oncology

## 2021-12-04 VITALS — BP 159/83 | HR 64 | Temp 97.1°F | Resp 18

## 2021-12-04 DIAGNOSIS — Z79899 Other long term (current) drug therapy: Secondary | ICD-10-CM | POA: Diagnosis not present

## 2021-12-04 DIAGNOSIS — G8250 Quadriplegia, unspecified: Secondary | ICD-10-CM | POA: Insufficient documentation

## 2021-12-04 DIAGNOSIS — I700 Atherosclerosis of aorta: Secondary | ICD-10-CM | POA: Insufficient documentation

## 2021-12-04 DIAGNOSIS — Z801 Family history of malignant neoplasm of trachea, bronchus and lung: Secondary | ICD-10-CM | POA: Diagnosis not present

## 2021-12-04 DIAGNOSIS — Z803 Family history of malignant neoplasm of breast: Secondary | ICD-10-CM | POA: Insufficient documentation

## 2021-12-04 DIAGNOSIS — I10 Essential (primary) hypertension: Secondary | ICD-10-CM | POA: Insufficient documentation

## 2021-12-04 DIAGNOSIS — Z7189 Other specified counseling: Secondary | ICD-10-CM | POA: Diagnosis not present

## 2021-12-04 DIAGNOSIS — N319 Neuromuscular dysfunction of bladder, unspecified: Secondary | ICD-10-CM

## 2021-12-04 DIAGNOSIS — Z86718 Personal history of other venous thrombosis and embolism: Secondary | ICD-10-CM | POA: Insufficient documentation

## 2021-12-04 DIAGNOSIS — Z7901 Long term (current) use of anticoagulants: Secondary | ICD-10-CM | POA: Insufficient documentation

## 2021-12-04 DIAGNOSIS — Z814 Family history of other substance abuse and dependence: Secondary | ICD-10-CM | POA: Diagnosis not present

## 2021-12-04 DIAGNOSIS — M47816 Spondylosis without myelopathy or radiculopathy, lumbar region: Secondary | ICD-10-CM | POA: Diagnosis not present

## 2021-12-04 DIAGNOSIS — C7940 Secondary malignant neoplasm of unspecified part of nervous system: Secondary | ICD-10-CM

## 2021-12-04 DIAGNOSIS — C770 Secondary and unspecified malignant neoplasm of lymph nodes of head, face and neck: Secondary | ICD-10-CM | POA: Insufficient documentation

## 2021-12-04 DIAGNOSIS — K219 Gastro-esophageal reflux disease without esophagitis: Secondary | ICD-10-CM | POA: Insufficient documentation

## 2021-12-04 DIAGNOSIS — C61 Malignant neoplasm of prostate: Secondary | ICD-10-CM

## 2021-12-04 DIAGNOSIS — I82492 Acute embolism and thrombosis of other specified deep vein of left lower extremity: Secondary | ICD-10-CM

## 2021-12-04 DIAGNOSIS — I82409 Acute embolism and thrombosis of unspecified deep veins of unspecified lower extremity: Secondary | ICD-10-CM | POA: Insufficient documentation

## 2021-12-04 DIAGNOSIS — Z823 Family history of stroke: Secondary | ICD-10-CM | POA: Diagnosis not present

## 2021-12-04 DIAGNOSIS — C7951 Secondary malignant neoplasm of bone: Secondary | ICD-10-CM | POA: Insufficient documentation

## 2021-12-04 DIAGNOSIS — M48061 Spinal stenosis, lumbar region without neurogenic claudication: Secondary | ICD-10-CM | POA: Insufficient documentation

## 2021-12-04 DIAGNOSIS — M4802 Spinal stenosis, cervical region: Secondary | ICD-10-CM | POA: Diagnosis not present

## 2021-12-04 DIAGNOSIS — Z923 Personal history of irradiation: Secondary | ICD-10-CM | POA: Insufficient documentation

## 2021-12-04 DIAGNOSIS — Z79890 Hormone replacement therapy (postmenopausal): Secondary | ICD-10-CM | POA: Insufficient documentation

## 2021-12-04 DIAGNOSIS — Z8249 Family history of ischemic heart disease and other diseases of the circulatory system: Secondary | ICD-10-CM | POA: Diagnosis not present

## 2021-12-04 DIAGNOSIS — Z8349 Family history of other endocrine, nutritional and metabolic diseases: Secondary | ICD-10-CM | POA: Diagnosis not present

## 2021-12-04 DIAGNOSIS — Z993 Dependence on wheelchair: Secondary | ICD-10-CM | POA: Diagnosis not present

## 2021-12-04 LAB — CBC WITH DIFFERENTIAL/PLATELET
Abs Immature Granulocytes: 0.02 10*3/uL (ref 0.00–0.07)
Basophils Absolute: 0.1 10*3/uL (ref 0.0–0.1)
Basophils Relative: 1 %
Eosinophils Absolute: 0.2 10*3/uL (ref 0.0–0.5)
Eosinophils Relative: 4 %
HCT: 41.9 % (ref 39.0–52.0)
Hemoglobin: 13.9 g/dL (ref 13.0–17.0)
Immature Granulocytes: 0 %
Lymphocytes Relative: 13 %
Lymphs Abs: 0.8 10*3/uL (ref 0.7–4.0)
MCH: 31.3 pg (ref 26.0–34.0)
MCHC: 33.2 g/dL (ref 30.0–36.0)
MCV: 94.4 fL (ref 80.0–100.0)
Monocytes Absolute: 0.4 10*3/uL (ref 0.1–1.0)
Monocytes Relative: 8 %
Neutro Abs: 4.1 10*3/uL (ref 1.7–7.7)
Neutrophils Relative %: 74 %
Platelets: 274 10*3/uL (ref 150–400)
RBC: 4.44 MIL/uL (ref 4.22–5.81)
RDW: 14.7 % (ref 11.5–15.5)
WBC: 5.6 10*3/uL (ref 4.0–10.5)
nRBC: 0 % (ref 0.0–0.2)

## 2021-12-04 LAB — COMPREHENSIVE METABOLIC PANEL
ALT: 38 U/L (ref 0–44)
AST: 36 U/L (ref 15–41)
Albumin: 3.7 g/dL (ref 3.5–5.0)
Alkaline Phosphatase: 320 U/L — ABNORMAL HIGH (ref 38–126)
Anion gap: 8 (ref 5–15)
BUN: 9 mg/dL (ref 8–23)
CO2: 27 mmol/L (ref 22–32)
Calcium: 9.1 mg/dL (ref 8.9–10.3)
Chloride: 102 mmol/L (ref 98–111)
Creatinine, Ser: 0.42 mg/dL — ABNORMAL LOW (ref 0.61–1.24)
GFR, Estimated: >60 mL/min (ref >60)
Glucose, Bld: 95 mg/dL (ref 70–99)
Potassium: 3.6 mmol/L (ref 3.5–5.1)
Sodium: 137 mmol/L (ref 135–145)
Total Bilirubin: 0.8 mg/dL (ref 0.3–1.2)
Total Protein: 7.3 g/dL (ref 6.5–8.1)

## 2021-12-04 LAB — PSA: Prostatic Specific Antigen: 67.09 ng/mL — ABNORMAL HIGH (ref 0.00–4.00)

## 2021-12-04 NOTE — Telephone Encounter (Signed)
Verbal orders given to [facility].

## 2021-12-04 NOTE — Assessment & Plan Note (Signed)
Per Dr.Lovorn Jason [name]'s note, patient developed left gastrocnemius vein DVT 8/26.  Left lower extremity ultrasound report is not available to me in current EMR. Message sent to Dr.Lovorn to clarify

## 2021-12-04 NOTE — Telephone Encounter (Signed)
Alden Benjamin from [facility] called in stating that she received the orders for [facility] and needed to verify some information. She stated that patient is on anxiety medication and wanted to confirm the diagnoses. She wanted to evaluate for a Foley catheter but wanted to know if its okay to use a 16 [place] Coude catheter since he has prostate cancer. He has a level 2 severity drug interaction with Meloxicam and Eliquis. She wanted to know if patient has had a pneumonia vaccine, he wasn't sure if he has or not. Wife has declined a Education officer, museum and [place] aide at this time but may reconsider. She wanted to know if its ok to do irrigation with 60cc of sterile water every 4 weeks for foley catheter.    FYI: She stated that right now she is doing 1x a week for 2wks, 1x every 4 wks for 4, and then she will be 1x every 3 wks for 3.

## 2021-12-04 NOTE — Assessment & Plan Note (Addendum)
Metastatic castration sensitive prostate cancer, with CNS involvement. Currently on chemoprevention therapy with Eligard.  Status post palliative radiation to spine. Not a candidate for docetaxel treatments. Recommend adding antigen receptor blocker, ie Xtandi or Erleada.  Rationale and potential side effects were reviewed and discussed with patient and wife.  They agreed with the plan.   will check coverage.  Eligard is due in mid Dec 2023.  Obtain PMSA

## 2021-12-04 NOTE — Assessment & Plan Note (Signed)
Refer to urology.  ?

## 2021-12-04 NOTE — Progress Notes (Signed)
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 Currently on chemoprevention therapy with Eligard.  Status post palliative radiation to spine. Not a candidate for docetaxel treatments. Recommend adding antigen receptor blocker, ie Xtandi or Erleada.  Rationale and potential side effects were reviewed and discussed with patient and wife.  They agreed with the plan.   will check coverage.  Eligard is due in mid Dec 2023.  Obtain PMSA  Goals of care, counseling/discussion Discussed with patient.   Neurogenic bladder Refer to urology  Acute incomplete quadriplegia [facility]) Patient would like to be referred to local neurologist Refer to Dr.Vaslow.   DVT (deep venous thrombosis) ([place]) Per Dr.Lovorn Megan's note, patient developed left gastrocnemius vein DVT 8/26.  Left lower extremity ultrasound report is not available to me in current EMR. Message sent to Arispe to clarify  Orders Placed This Encounter  Procedures   Amb Referral to Neuro Oncology    Referral Priority:   Routine    Referral Type:   Consultation    Referral Reason:   Specialty Services Required    Number of Visits Requested:   1   Ambulatory referral to Urology    Referral Priority:   Routine    Referral Type:   Consultation    Referral Reason:   Specialty Services Required    Requested Specialty:   Urology    Number of Visits Requested:   1   Follow up TBD  All questions were answered. The patient knows to call the clinic with any problems, questions or concerns.  Earlie Server, MD, PhD [facility]  Hematology Oncology 12/04/2021      CHIEF COMPLAINTS/PURPOSE OF CONSULTATION:  Metastatic prostate cancer   HISTORY OF PRESENTING ILLNESS:  Jason [name] 66 y.o. male presents to establish care for metastatic prostate cancer I have reviewed his chart and materials related to his cancer extensively and collaborated history with the patient. 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  Associated epidural disease greater at the C6 where there is marked canal stenosis without cord compression.  Left greater than right foraminal effacement due to the extraosseous disease.   10/03/2021 Imaging   MRI left shoulder showed High-riding humeral head with massive full-thickness tear of the entire supraspinatus tendon and the anterior 50% of the infraspinatus tendon. Additional partial-thickness articular sided tearing of the posterior infraspinatus. Moderate to high-grade supraspinatus and infraspinatus muscle atrophy suggests these tears  are chronic. Partial-thickness tearing of the superior greater than inferior  aspects of the subscapularis tendon footprint. Mild subscapularis muscle atrophy.  Moderate degenerative changes of the acromioclavicular joint. Moderate glenohumeral cartilage degenerative changes.   10/05/2021 Imaging   CT chest abdomen pelvis with contrast showed  Moderate severity retroperitoneal and pelvic lymphadenopathy,consistent with metastatic disease. Small lytic areas at the levels of L4 and S1, with diffusely sclerotic changes involving the second and fourth right ribs, sacrum and left iliac bone. These findings are  likely consistent with osseous metastasis. 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  Sigmoid diverticulosis. Aortic atherosclerosis   10/05/2021 Imaging   CT cervical spine without contrast Extensive heterogeneous sclerotic lesions throughout the cervical spine compatible with metastatic disease to the bone.  Mixed sclerotic and lytic changes in the posterior elements at C5 and C6.Extraosseous tumor at C5 and C6 is better appreciated on the MRI scan.   10/06/2021 Cancer Staging   Staging form: Prostate, AJCC 8th Edition - Clinical stage from 10/06/2021: Stage IVB (cN1, pM1c, PSA: 1500) - Signed by Earlie Server, MD on 12/04/2021 Stage prefix: Initial diagnosis Prostate specific antigen (PSA) range: 20 or greater   10/08/2021 Imaging   MRI thoracic spine and the lumbar spine with and without contrast 1. 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 2. 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 3. Additional epidural tumor along the posterior endplates at I5,O27, and T12 without high-grade spinal canal stenosis or cord compression. 4. Extensive epidural tumor in the lumbar spine is greatest in thickness at L1 without high-grade spinal canal stenosis at this level; however, superimposed on pre-existing degenerative changes lower in the lumbar spine results in severe spinal canal stenosis with cauda equina nerve root compression at L3-L4 and L4-L5. Multilevel neural foraminal stenosis is detailed above. 5. Postsurgical changes reflecting C6 laminectomy unchanged spinal canal stenosis compared to the postoperative study from 1 day prior.No definite cord signal abnormality. 6. Extramedullary fluid collection along the dorsal aspect of the cord beginning at T9-T10 extending into the lumbar spine  is likely subdural in location but is of uncertain etiology; evolving blood products not excluded. The collection anteriorly displaces the cord and cauda equina nerve roots without frank cord compression or signal abnormality. There is no peripheral enhancement to suggest abscess. 7. Prevertebral edema in the cervical spine which may be postsurgical in nature. 8. New bilateral lower lobe consolidations may reflect atelectasis or developing pneumonia/aspiration.   10/08/2021 Imaging   MRI brain with and without contrast showed 1. 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 There is intracranial extension of tumor along the right frontal convexity measuring up to 5 mm in thickness without mass effect on the underlying brain parenchyma or midline shift. Extracranial extension of tumor in this location measures up to approximately 3 mm. 2. Additional extracranial extension of tumor into the scalp along the vertex measuring up to 7 mm in thickness. 3. Small focus of diffusion restriction in the right parietal cortex most in keeping with a small acute infarct. 4. No evidence of parenchymal metastatic disease.   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 10/06/2021, patient underwent C6 cervical laminectomy and resection of tumor.  Pathology came back positive for metastatic prostate cancer. 8/14 developed significant bilateral upper and lower extremity weakness with hypotension requiring transfer to the ICU, pressors, steroid, respiratory failure on mechanical ventilation. Later patient was stabilized and extubated.  8/16 Firmagon loading dose '240mg'$   9/15 Eligard 22.'5mg'$  given inpatient during his rehab admission.     10/2021 -  Radiation Therapy   Palliative radiation to spine.     INTERVAL HISTORY Jason [name]  is a 66 y.o. male who has above  history reviewed by me today presents for follow up visit for metastatic prostate cancer. Patient was accompanied by wife.  Patient has neurogenic bladder and has indwelling Foley catheter. Patient has no extremity weakness. Patient is on Eliquis 5 mg twice daily.  Per rehab physician note, he developed left gastrocnemius vein DVT on 10/19/21.  I do not see documentation of ultrasound lower extremity in current EMR.   MEDICAL HISTORY:  Past Medical History:  Diagnosis Date   Allergy    Arthritis    osteoarthritis   GERD (gastroesophageal reflux disease)    Hypertension    Hypothyroidism    Renal calculi    Thyroid disease    Hyperthyroidism s/p radioactive iodine ablation    SURGICAL HISTORY: Past Surgical History:  Procedure Laterality Date   CARDIAC CATHETERIZATION  03/2000   HERNIA REPAIR  9983   umbilical   LEFT HEART CATH AND CORONARY ANGIOGRAPHY Left 03/20/2020   Procedure: LEFT HEART CATH AND CORONARY ANGIOGRAPHY;  Surgeon: Nelva Bush, MD;  Location: [place] [facility];  Service: Cardiovascular;  Laterality: Left;   POSTERIOR CERVICAL FUSION/FORAMINOTOMY N/A 10/06/2021   Procedure: POSTERIOR CERVICAL FUSION/ FORAMINOTOMY LEVEL 3;  Surgeon: Meade Maw, MD;  Location: [facility];  Service: Neurosurgery;  Laterality: N/A;  will need monitoring   RADIOLOGY WITH ANESTHESIA Left 04/18/2021   Procedure: MRI SHOULDER WITHOUT CONTRAST WITH ANESTHESIA;  Surgeon: Radiologist, Medication, MD;  Location: [place];  Service: Radiology;  Laterality: Left;   RADIOLOGY WITH ANESTHESIA N/A 10/03/2021   Procedure: MRI CERVICAL SPINE WITH ANESTHESIA;  Surgeon: Radiologist, Medication, MD;  Location: [place];  Service: Radiology;  Laterality: N/A;   REVERSE SHOULDER ARTHROPLASTY Left    ROTATOR CUFF REPAIR  11/2010   Dr [name] Heck   TONSILLECTOMY     TOTAL HIP ARTHROPLASTY Right 02/24/2009   TOTAL HIP ARTHROPLASTY Left 06/2016   Dr Harlow Mares    SOCIAL HISTORY: Social History    Socioeconomic History   Marital status: Married    Spouse name: [name]    Number of children: 0   Years of education: Not on file   Highest education level: Not on file  Occupational History   Occupation: Therapist, occupational: [employer]  Tobacco Use   Smoking status: Never   Smokeless tobacco: Never  Vaping Use   Vaping Use: Never used  Substance and Sexual Activity   Alcohol use: Not Currently    Comment: Alcohol once every few weeks/months   Drug use: No   Sexual activity: Yes  Other Topics Concern   Not on file  Social History Narrative   Lives at home with spouse.    Social Determinants of Health   Financial Resource Strain: Not on file  Food Insecurity: Not on file  Transportation Needs: Not on file  Physical Activity: Not on file  Stress: Not on file  Social Connections: Not on file  Intimate Partner Violence: Not on file    FAMILY HISTORY: Family History  Problem Relation Age of Onset   Hypertension Mother    Stroke Mother    Gout Father    Heart attack Father 11   Heart disease Sister    Lung cancer Sister    Drug abuse Sister    Breast cancer Paternal Aunt    Lung cancer Maternal Grandmother    Colon cancer Neg Hx    Esophageal cancer Neg Hx    Rectal cancer Neg Hx  Stomach cancer Neg Hx     ALLERGIES:  has No Known Allergies.  MEDICATIONS:  Current Outpatient Medications  Medication Sig Dispense Refill   acetaminophen (TYLENOL) 325 MG tablet Take 1-2 tablets (325-650 mg total) by mouth every 4 (four) hours as needed for mild pain.     apixaban (ELIQUIS) 5 MG TABS tablet Take 1 tablet (5 mg total) by mouth 2 (two) times daily. 60 tablet 0   ascorbic acid (VITAMIN C) 500 MG tablet Take 1 tablet (500 mg total) by mouth daily. 30 tablet 0   aspirin EC 81 MG tablet Take 1 tablet (81 mg total) by mouth daily. Swallow whole. 30 tablet 12   atorvastatin (LIPITOR) 40 MG tablet Take 1 tablet (40 mg total) by mouth daily. 30 tablet 0    bisacodyl (DULCOLAX) 10 MG suppository Place 1 suppository (10 mg total) rectally daily as needed for moderate constipation. 12 suppository 0   cetirizine (ZYRTEC) 10 MG tablet Take 10 mg by mouth at bedtime.     clonazePAM (KLONOPIN) 0.5 MG tablet Take 1 tablet (0.5 mg total) by mouth 2 (two) times daily. 60 tablet 0   docusate sodium (COLACE) 100 MG capsule Take 2 capsules (200 mg total) by mouth daily. 10 capsule 0   famotidine (PEPCID) 20 MG tablet Take 1 tablet (20 mg total) by mouth daily. 30 tablet 0   FLUoxetine (PROZAC) 20 MG capsule Take 1 capsule (20 mg total) by mouth at bedtime. 30 capsule 0   fluticasone (FLONASE) 50 MCG/ACT nasal spray Place 1 spray into both nostrils daily. 16 g 0   Iron Polysacch Cmplx-B12-FA (POLY-IRON 150 FORTE) 150-0.025-1 MG CAPS Take 150 mg by mouth daily. 30 capsule 0   levothyroxine (SYNTHROID) 88 MCG tablet Take 1 tablet (88 mcg total) by mouth daily. 30 tablet 0   meloxicam (MOBIC) 15 MG tablet Take 1 tablet (15 mg total) by mouth daily. 30 tablet 0   midodrine (PROAMATINE) 5 MG tablet Take 1 tablet (5 mg total) by mouth 3 (three) times daily with meals. (Patient taking differently: Take 5 mg by mouth 3 (three) times daily with meals. Only takes if systolic <546 and diastolic <270, per pt) 90 tablet 0   Multiple Vitamin (MULTIVITAMIN) tablet Take 1 tablet by mouth daily.     nystatin (MYCOSTATIN) 100000 UNIT/ML suspension Take 5 mLs (500,000 Units total) by mouth 4 (four) times daily. Swish and Swallow 60 mL 0   Phenol-Phenolate Sodium (THROAT SPRAY MT) Use as directed in the mouth or throat. HURRICAINE     potassium chloride (KLOR-CON M) 10 MEQ tablet Take 1 tablet (10 mEq total) by mouth at bedtime. 30 tablet 0   tamsulosin (FLOMAX) 0.4 MG CAPS capsule Take 1 capsule (0.4 mg total) by mouth daily after supper. 30 capsule 0   polyethylene glycol (MIRALAX / GLYCOLAX) 17 g packet Take 17 g by mouth daily as needed for mild constipation. (Patient not taking:  Reported on 12/02/2021) 14 each 0   No current facility-administered medications for this visit.    Review of Systems  Constitutional:  Positive for fatigue. Negative for appetite change and chills.  HENT:   Negative for hearing loss.   Eyes:  Negative for icterus.  Respiratory:  Negative for cough and shortness of breath.   Cardiovascular:  Negative for chest pain.  Gastrointestinal:  Negative for abdominal pain and blood in stool.  Genitourinary:         + Foley catheter  Musculoskeletal:  Negative  for back pain.  Skin:  Negative for rash.  Neurological:  Positive for extremity weakness.     PHYSICAL EXAMINATION: ECOG PERFORMANCE STATUS: 2 - Symptomatic, <50% confined to bed  Vitals:   12/04/21 1039  BP: (!) 159/83  Pulse: 64  Resp: 18  Temp: (!) 97.1 F (36.2 C)  SpO2: 96%   Filed Weights    Physical Exam Constitutional:      General: He is not in acute distress.    Appearance: He is not diaphoretic.     Comments: Patient sits in wheelchair.  HENT:     Head: Normocephalic and atraumatic.     Nose: Nose normal.     Mouth/Throat:     Pharynx: No oropharyngeal exudate.  Eyes:     General: No scleral icterus. Cardiovascular:     Rate and Rhythm: Normal rate.  Pulmonary:     Effort: Pulmonary effort is normal. No respiratory distress.  Abdominal:     General: There is no distension.  Musculoskeletal:        General: Normal range of motion.     Cervical back: Normal range of motion.  Skin:    General: Skin is warm and dry.     Findings: No erythema.  Neurological:     Mental Status: He is alert and oriented to person, place, and time.     Cranial Nerves: No cranial nerve deficit.     Motor: No abnormal muscle tone.     Comments: Decreased lower extremity strength.  Psychiatric:        Mood and Affect: Mood and affect normal.      LABORATORY DATA:  I have reviewed the data as listed    Latest Ref Rng & Units 12/04/2021    9:57 AM 11/17/2021    8:06  AM 11/14/2021    7:11 AM  CBC  WBC 4.0 - 10.5 K/uL 5.6  4.8  5.3   Hemoglobin 13.0 - 17.0 g/dL 13.9  11.5  11.3   Hematocrit 39.0 - 52.0 % 41.9  34.4  35.0   Platelets 150 - 400 K/uL 274  219  219       Latest Ref Rng & Units 12/04/2021    9:57 AM 11/18/2021    8:54 AM 11/11/2021    7:11 AM  CMP  Glucose 70 - 99 mg/dL 95  131  104   BUN 8 - 23 mg/dL '9  13  22   '$ Creatinine 0.61 - 1.24 mg/dL 0.42  0.53  0.53   Sodium 135 - 145 mmol/L 137  139  138   Potassium 3.5 - 5.1 mmol/L 3.6  3.6  3.9   Chloride 98 - 111 mmol/L 102  104  106   CO2 22 - 32 mmol/L '27  27  27   '$ Calcium 8.9 - 10.3 mg/dL 9.1  8.8  8.6   Total Protein 6.5 - 8.1 g/dL 7.3  6.0    Total Bilirubin 0.3 - 1.2 mg/dL 0.8  0.7    Alkaline Phos 38 - 126 U/L 320  474    AST 15 - 41 U/L 36  29    ALT 0 - 44 U/L 38  31       RADIOGRAPHIC STUDIES: I have personally reviewed the radiological images as listed and agreed with the findings in the report. VAS [place] LOWER EXTREMITY VENOUS (DVT)  Result Date: 11/04/2021  Lower Venous DVT Study Patient Name:  GOLDMAN BIRCHALL  Date of Exam:  11/04/2021 Medical Rec #: [id number]         Accession #:    [id number] Date of Birth: 1955/03/11         Patient Gender: M Patient Age:   47 years Exam Location:  [facility] Procedure:      VAS [place] LOWER EXTREMITY VENOUS (DVT) Referring Phys: Jennye Boroughs --------------------------------------------------------------------------------  Indications: Follow up left gastrocnemius clot.  Comparison Study: 10-18-2021 Prior bilateral lower extremity venous showed acute                   gastrocnemius DVT left. Performing Technologist: Darlin Coco RDMS, RVT  Examination Guidelines: A complete evaluation includes B-mode imaging, spectral Doppler, color Doppler, and power Doppler as needed of all accessible portions of each vessel. Bilateral testing is considered an integral part of a complete examination. Limited examinations for reoccurring indications  may be performed as noted. The reflux portion of the exam is performed with the patient in reverse Trendelenburg.  +-----+---------------+---------+-----------+----------+--------------+ RIGHTCompressibilityPhasicitySpontaneityPropertiesThrombus Aging +-----+---------------+---------+-----------+----------+--------------+ CFV  Full           Yes      Yes                                 +-----+---------------+---------+-----------+----------+--------------+   +---------+---------------+---------+-----------+----------+--------------+ LEFT     CompressibilityPhasicitySpontaneityPropertiesThrombus Aging +---------+---------------+---------+-----------+----------+--------------+ CFV      Full           Yes      Yes                                 +---------+---------------+---------+-----------+----------+--------------+ SFJ      Full                                                        +---------+---------------+---------+-----------+----------+--------------+ FV Prox  Full                                                        +---------+---------------+---------+-----------+----------+--------------+ FV Mid   Full                                                        +---------+---------------+---------+-----------+----------+--------------+ FV DistalFull                                                        +---------+---------------+---------+-----------+----------+--------------+ PFV      Full                                                        +---------+---------------+---------+-----------+----------+--------------+  POP      Full           Yes      Yes                                 +---------+---------------+---------+-----------+----------+--------------+ PTV      Full                                                        +---------+---------------+---------+-----------+----------+--------------+ PERO     Full                                                         +---------+---------------+---------+-----------+----------+--------------+ Gastroc  Full                                                        +---------+---------------+---------+-----------+----------+--------------+    Summary: RIGHT: - No evidence of common femoral vein obstruction.  LEFT: - There is no evidence of deep vein thrombosis in the lower extremity.  - No cystic structure found in the popliteal fossa.  *See table(s) above for measurements and observations. Electronically signed by Monica Martinez MD on 11/04/2021 at 4:19:13 PM.    Final     ASSESSMENT & PLAN:  No problem-specific Assessment & Plan notes found for this encounter.   Orders Placed This Encounter  Procedures   Amb Referral to Neuro Oncology    Referral Priority:   Routine    Referral Type:   Consultation    Referral Reason:   Specialty Services Required    Number of Visits Requested:   1   Ambulatory referral to Urology    Referral Priority:   Routine    Referral Type:   Consultation    Referral Reason:   Specialty Services Required    Requested Specialty:   Urology    Number of Visits Requested:   1      All questions were answered. The patient knows to call the clinic with any problems, questions or concerns. No barriers to learning was detected.  Earlie Server, MD 12/04/2021

## 2021-12-04 NOTE — Assessment & Plan Note (Signed)
Discussed with patient

## 2021-12-04 NOTE — Assessment & Plan Note (Signed)
Patient would like to be referred to local neurologist Refer to Dr.Vaslow.

## 2021-12-04 NOTE — Progress Notes (Signed)
Patient here for follow up. Pt reports that he is using a throat spray, but would like to know if he can dc.

## 2021-12-04 NOTE — Progress Notes (Signed)
START ON PATHWAY REGIMEN - Prostate     A cycle is every 28 days:     Enzalutamide   **Always confirm dose/schedule in your pharmacy ordering system**  Patient Characteristics: Adenocarcinoma, Recurrent/New Systemic Disease (Including Biochemical Recurrence), Non-Castrate, M1 Histology: Adenocarcinoma Therapeutic Status: Recurrent/New Systemic Disease (Including Biochemical Recurrence) Intent of Therapy: Non-Curative / Palliative Intent, Discussed with Patient 

## 2021-12-05 ENCOUNTER — Telehealth: Payer: Self-pay | Admitting: Pharmacist

## 2021-12-05 ENCOUNTER — Telehealth: Payer: Self-pay

## 2021-12-05 ENCOUNTER — Encounter: Payer: Self-pay | Admitting: Internal Medicine

## 2021-12-05 DIAGNOSIS — F419 Anxiety disorder, unspecified: Secondary | ICD-10-CM | POA: Diagnosis not present

## 2021-12-05 DIAGNOSIS — N400 Enlarged prostate without lower urinary tract symptoms: Secondary | ICD-10-CM | POA: Diagnosis not present

## 2021-12-05 DIAGNOSIS — I959 Hypotension, unspecified: Secondary | ICD-10-CM | POA: Diagnosis not present

## 2021-12-05 DIAGNOSIS — G8254 Quadriplegia, C5-C7 incomplete: Secondary | ICD-10-CM | POA: Diagnosis not present

## 2021-12-05 DIAGNOSIS — I200 Unstable angina: Secondary | ICD-10-CM | POA: Diagnosis not present

## 2021-12-05 DIAGNOSIS — M1990 Unspecified osteoarthritis, unspecified site: Secondary | ICD-10-CM | POA: Diagnosis not present

## 2021-12-05 DIAGNOSIS — K219 Gastro-esophageal reflux disease without esophagitis: Secondary | ICD-10-CM | POA: Diagnosis not present

## 2021-12-05 DIAGNOSIS — I10 Essential (primary) hypertension: Secondary | ICD-10-CM | POA: Diagnosis not present

## 2021-12-05 DIAGNOSIS — C61 Malignant neoplasm of prostate: Secondary | ICD-10-CM | POA: Diagnosis not present

## 2021-12-05 DIAGNOSIS — D630 Anemia in neoplastic disease: Secondary | ICD-10-CM | POA: Diagnosis not present

## 2021-12-05 DIAGNOSIS — C7940 Secondary malignant neoplasm of unspecified part of nervous system: Secondary | ICD-10-CM | POA: Diagnosis not present

## 2021-12-05 DIAGNOSIS — E039 Hypothyroidism, unspecified: Secondary | ICD-10-CM | POA: Diagnosis not present

## 2021-12-05 DIAGNOSIS — J9692 Respiratory failure, unspecified with hypercapnia: Secondary | ICD-10-CM | POA: Diagnosis not present

## 2021-12-05 DIAGNOSIS — D509 Iron deficiency anemia, unspecified: Secondary | ICD-10-CM | POA: Diagnosis not present

## 2021-12-05 DIAGNOSIS — J9691 Respiratory failure, unspecified with hypoxia: Secondary | ICD-10-CM | POA: Diagnosis not present

## 2021-12-05 DIAGNOSIS — C7951 Secondary malignant neoplasm of bone: Secondary | ICD-10-CM | POA: Diagnosis not present

## 2021-12-05 NOTE — Telephone Encounter (Signed)
-----   Message from Darl Pikes, [place] sent at 12/05/2021  9:48 AM EDT ----- Both Louretta Shorten interact with patient's Jason [name]. Dr. Tasia Catchings thinks patient may be able to come off the Jason [name] soon. If not, she will see about switching him to edoxaban to avoid the interaction.   She asked me to check in with her in a few weeks on his status. For now we re holding on getting either approved.   ----- Message ----- From: Earlie Server, MD Sent: 12/04/2021   8:01 PM EDT To: Evelina Dun, RN; #  Clearnce Sorrel, I plan to start him on androgen receptor xtandi or erleada. Would you please check his coverage?  Thanks.   Talbert Cage

## 2021-12-10 DIAGNOSIS — J9691 Respiratory failure, unspecified with hypoxia: Secondary | ICD-10-CM | POA: Diagnosis not present

## 2021-12-10 DIAGNOSIS — F419 Anxiety disorder, unspecified: Secondary | ICD-10-CM | POA: Diagnosis not present

## 2021-12-10 DIAGNOSIS — C61 Malignant neoplasm of prostate: Secondary | ICD-10-CM | POA: Diagnosis not present

## 2021-12-10 DIAGNOSIS — K219 Gastro-esophageal reflux disease without esophagitis: Secondary | ICD-10-CM | POA: Diagnosis not present

## 2021-12-10 DIAGNOSIS — M1990 Unspecified osteoarthritis, unspecified site: Secondary | ICD-10-CM | POA: Diagnosis not present

## 2021-12-10 DIAGNOSIS — N400 Enlarged prostate without lower urinary tract symptoms: Secondary | ICD-10-CM | POA: Diagnosis not present

## 2021-12-10 DIAGNOSIS — I10 Essential (primary) hypertension: Secondary | ICD-10-CM | POA: Diagnosis not present

## 2021-12-10 DIAGNOSIS — J9692 Respiratory failure, unspecified with hypercapnia: Secondary | ICD-10-CM | POA: Diagnosis not present

## 2021-12-10 DIAGNOSIS — E039 Hypothyroidism, unspecified: Secondary | ICD-10-CM | POA: Diagnosis not present

## 2021-12-10 DIAGNOSIS — D509 Iron deficiency anemia, unspecified: Secondary | ICD-10-CM | POA: Diagnosis not present

## 2021-12-10 DIAGNOSIS — I200 Unstable angina: Secondary | ICD-10-CM | POA: Diagnosis not present

## 2021-12-10 DIAGNOSIS — G8254 Quadriplegia, C5-C7 incomplete: Secondary | ICD-10-CM | POA: Diagnosis not present

## 2021-12-10 DIAGNOSIS — C7951 Secondary malignant neoplasm of bone: Secondary | ICD-10-CM | POA: Diagnosis not present

## 2021-12-10 DIAGNOSIS — D630 Anemia in neoplastic disease: Secondary | ICD-10-CM | POA: Diagnosis not present

## 2021-12-10 DIAGNOSIS — I959 Hypotension, unspecified: Secondary | ICD-10-CM | POA: Diagnosis not present

## 2021-12-10 DIAGNOSIS — C7940 Secondary malignant neoplasm of unspecified part of nervous system: Secondary | ICD-10-CM | POA: Diagnosis not present

## 2021-12-11 ENCOUNTER — Encounter: Payer: Self-pay | Admitting: Internal Medicine

## 2021-12-11 ENCOUNTER — Telehealth: Payer: Self-pay

## 2021-12-11 ENCOUNTER — Other Ambulatory Visit (HOSPITAL_COMMUNITY): Payer: Self-pay

## 2021-12-11 ENCOUNTER — Ambulatory Visit: Payer: BC Managed Care – PPO | Admitting: Internal Medicine

## 2021-12-11 VITALS — BP 132/96 | HR 60 | Resp 18

## 2021-12-11 DIAGNOSIS — C7940 Secondary malignant neoplasm of unspecified part of nervous system: Secondary | ICD-10-CM

## 2021-12-11 DIAGNOSIS — K219 Gastro-esophageal reflux disease without esophagitis: Secondary | ICD-10-CM

## 2021-12-11 DIAGNOSIS — G8250 Quadriplegia, unspecified: Secondary | ICD-10-CM | POA: Diagnosis not present

## 2021-12-11 DIAGNOSIS — F39 Unspecified mood [affective] disorder: Secondary | ICD-10-CM | POA: Diagnosis not present

## 2021-12-11 DIAGNOSIS — I82403 Acute embolism and thrombosis of unspecified deep veins of lower extremity, bilateral: Secondary | ICD-10-CM | POA: Diagnosis not present

## 2021-12-11 DIAGNOSIS — C61 Malignant neoplasm of prostate: Secondary | ICD-10-CM | POA: Diagnosis not present

## 2021-12-11 DIAGNOSIS — I82492 Acute embolism and thrombosis of other specified deep vein of left lower extremity: Secondary | ICD-10-CM

## 2021-12-11 MED ORDER — APIXABAN 5 MG PO TABS: 5.0000 mg | ORAL_TABLET | Freq: Two times a day (BID) | ORAL | 5 refills | Status: DC

## 2021-12-11 NOTE — Telephone Encounter (Signed)
Oral Oncology Patient Advocate Encounter  Prior Authorization for Jason [name] has been approved.    PA# [id number]  Effective dates: 10.18.23 through 10.18.24  Patient must fill through [facility] [phone].    [name], [facility] Patient [place]  [phone] (phone) [phone] (fax) 12/11/2021 10:43 AM

## 2021-12-11 NOTE — Assessment & Plan Note (Signed)
Is on the famotidine

## 2021-12-11 NOTE — Telephone Encounter (Signed)
Oral Oncology Pharmacist Encounter  Received new prescription for Xtandi (enzalutamide) for the treatment of metastatic castration sensitive prostate cancer in conjunction with ADT, planned duration until disease progression or unacceptable drug toxicity.  Prescription dose and frequency assessed.   Current medication list in Epic reviewed, several DDIs with enzalutamide identified: Apixaban: Enzalutamide may decrease the serum concentration of apixaban. Per Dr. Tasia Catchings, she reached out the the prescriber of the apixaban and was able to determine that his clot was provoked. She plans on re-imaging him to check on the clot status and potentially stopping the apixaban Enzalutamide may decrease the serum concentration of atorvastatin and clonazepam. Monitor patient for decreased effectiveness of the listed medications. No baseline dose adjustment needed.   Evaluated chart and no patient barriers to medication adherence identified.   Prescription has been e-scribed to [facility] for benefits analysis and approval.  Oral Oncology Clinic will continue to follow for insurance authorization, copayment issues, initial counseling and start date.   Darl Pikes, PharmD, BCPS, BCOP, CPP Hematology/Oncology Clinical Pharmacist Practitioner [place]/[facility]/AP [facility] [phone]  12/11/2021 10:12 AM

## 2021-12-11 NOTE — Telephone Encounter (Signed)
-----   Message from Earlie Server, MD sent at 12/11/2021  3:26 PM EDT ----- I just spoke with patient and his wife.  I recommend repeat US of left low extremity in mid Nov. Lab MD a few days after [place]. Please order Cbc cmp PSA They agree with the plan please arrange.

## 2021-12-11 NOTE — Assessment & Plan Note (Signed)
Provoked in legs Current plan voiced by Dr Yu---3 months of eliquis and then recheck ultrasound (especially since can't take this with xtandi)

## 2021-12-11 NOTE — Progress Notes (Signed)
Subjective:    Patient ID: Jason Jason [name], male    DOB: 1956/01/06, 66 y.o.   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 Next thing he knew, he was in ICU. Hypotensive and MRI then showed calvarial mets and extension into brain (without mass effect) Then lost sensation in arms/legs---and was paralyzed  Transferred for inpatient rehab There for 4 weeks---they were happy with the program Now uses arms fairly well--though left arm still weak Some movement in legs ---but still using Hoyer lift (with help of neighbors) Motorized chair  Indwelling Foley Bowel program---colace bid then rectal stim/suppository several times night---goes the next morning  Eats himself and brushes teeth Wife helps with hair  On eliqus for DVTs Seeing Jason Jason Jason [name] for the prostate cancer as well  Started on fluoxetine in hospital Feels his mood is okay though  [name] Outpatient Medications on File Prior to Visit  Medication Sig Dispense Refill   acetaminophen (TYLENOL) 325 MG tablet Take 1-2 tablets (325-650 mg total) by mouth every 4 (four) hours as needed for mild pain.     apixaban (ELIQUIS) 5 MG TABS tablet Take 1 tablet (5 mg total) by mouth 2 (two) times daily. 60 tablet 0   ascorbic acid (VITAMIN C) 500 MG tablet Take 1 tablet (500 mg total) by mouth daily. 30 tablet 0   aspirin EC 81 MG tablet Take 1 tablet (81 mg total) by mouth daily. Swallow whole. 30 tablet 12   atorvastatin (LIPITOR) 40 MG tablet Take 1 tablet (40 mg total) by mouth daily. 30 tablet 0   bisacodyl  (DULCOLAX) 10 MG suppository Place 1 suppository (10 mg total) rectally daily as needed for moderate constipation. 12 suppository 0   cetirizine (ZYRTEC) 10 MG tablet Take 10 mg by mouth at bedtime.     clonazePAM (KLONOPIN) 0.5 MG tablet Take 1 tablet (0.5 mg total) by mouth 2 (two) times daily. 60 tablet 0   docusate sodium (COLACE) 100 MG capsule Take 2 capsules (200 mg total) by mouth daily. 10 capsule 0   famotidine (PEPCID) 20 MG tablet Take 1 tablet (20 mg total) by mouth daily. 30 tablet 0   FLUoxetine (PROZAC) 20 MG capsule Take 1 capsule (20 mg total) by mouth at bedtime. 30 capsule 0   fluticasone (FLONASE) 50 MCG/ACT nasal spray Place 1 spray into both nostrils daily. 16 g 0   Iron Polysacch Cmplx-B12-FA (POLY-IRON 150 FORTE) 150-0.025-1 MG CAPS Take 150 mg by mouth daily. 30 capsule 0   levothyroxine (SYNTHROID) 88 MCG tablet Take 1 tablet (88 mcg total) by mouth daily. 30 tablet 0   meloxicam (MOBIC) 15 MG tablet Take 1 tablet (15 mg total) by mouth daily. 30 tablet 0   Multiple Vitamin (MULTIVITAMIN) tablet Take 1 tablet by mouth daily.     polyethylene glycol (MIRALAX / GLYCOLAX) 17 g packet Take 17 g by mouth daily as needed for mild constipation. 14 each 0   potassium chloride (KLOR-CON M) 10 MEQ tablet Take 1 tablet (10 mEq total) by mouth at bedtime. 30 tablet 0   tamsulosin (FLOMAX) 0.4 MG CAPS capsule  Take 1 capsule (0.4 mg total) by mouth daily after supper. 30 capsule 0   [DISCONTINUED] iron polysaccharides (NIFEREX) 150 MG capsule Take 1 capsule (150 mg total) by mouth daily. 30 capsule 3   No Jason [name] facility-administered medications on file prior to visit.    No Known Allergies  Past Medical History:  Diagnosis Date   Allergy    Arthritis    osteoarthritis   GERD (gastroesophageal reflux disease)    Hypertension    Hypothyroidism    Renal calculi    Thyroid disease    Hyperthyroidism s/p radioactive iodine ablation    Past Surgical History:  Procedure  Laterality Date   CARDIAC CATHETERIZATION  03/2000   HERNIA REPAIR  4098   umbilical   LEFT HEART CATH AND CORONARY ANGIOGRAPHY Left 03/20/2020   Procedure: LEFT HEART CATH AND CORONARY ANGIOGRAPHY;  Surgeon: Jason Bush, MD;  Location: [place] [facility];  Service: Cardiovascular;  Laterality: Left;   POSTERIOR CERVICAL FUSION/FORAMINOTOMY N/A 10/06/2021   Procedure: POSTERIOR CERVICAL FUSION/ FORAMINOTOMY LEVEL 3;  Surgeon: Jason Maw, MD;  Location: [facility];  Service: Neurosurgery;  Laterality: N/A;  will need monitoring   RADIOLOGY WITH ANESTHESIA Left 04/18/2021   Procedure: MRI SHOULDER WITHOUT CONTRAST WITH ANESTHESIA;  Surgeon: Radiologist, Medication, MD;  Location: [facility];  Service: Radiology;  Laterality: Left;   RADIOLOGY WITH ANESTHESIA N/A 10/03/2021   Procedure: MRI CERVICAL SPINE WITH ANESTHESIA;  Surgeon: Radiologist, Medication, MD;  Location: [facility];  Service: Radiology;  Laterality: N/A;   REVERSE SHOULDER ARTHROPLASTY Left    ROTATOR CUFF REPAIR  11/2010   Jason Jason [name]   TONSILLECTOMY     TOTAL HIP ARTHROPLASTY Right 02/24/2009   TOTAL HIP ARTHROPLASTY Left 06/2016   Jason Jason Jason [name]    Family History  Problem Relation Age of Onset   Hypertension Mother    Stroke Mother    Gout Father    Heart attack Father 62   Heart disease Sister    Lung cancer Sister    Drug abuse Sister    Breast cancer Paternal Aunt    Lung cancer Maternal Grandmother    Colon cancer Neg Hx    Esophageal cancer Neg Hx    Rectal cancer Neg Hx    Stomach cancer Neg Hx     Social History   Socioeconomic History   Marital status: Married    Spouse name: Jason Jason [name]    Number of children: 0   Years of education: Not on file   Highest education level: Not on file  Occupational History   Occupation: Therapist, occupational: [employer]  Tobacco Use   Smoking status: Never   Smokeless tobacco: Never  Vaping Use   Vaping Use: Never used  Substance and Sexual Activity   Alcohol  use: Not Currently    Comment: Alcohol once every few weeks/months   Drug use: No   Sexual activity: Yes  Other Topics Concern   Not on file  Social History Narrative   Lives at home with spouse.    Social Determinants of Health   Financial Resource Strain: Not on file  Food Insecurity: Not on file  Transportation Needs: Not on file  Physical Activity: Not on file  Stress: Not on file  Social Connections: Not on file  Intimate Partner Violence: Not on file   Review of Systems Eating okay---has lost weight but hasn't been measuring Sleeps well Doing okay emotionally--for the most part No orthostatic dizziness now--off midodrine  Objective:   Physical Exam Constitutional:      Comments: Normal interaction ---in wheelchair  Cardiovascular:     Rate and Rhythm: Normal rate and regular rhythm.     Heart sounds: No murmur heard.    No gallop.  Pulmonary:     Effort: Pulmonary effort is normal.     Breath sounds: Normal breath sounds. No wheezing or rales.  Abdominal:     Palpations: Abdomen is soft.     Tenderness: There is no abdominal tenderness.  Musculoskeletal:     Cervical back: Neck supple.     Right lower leg: No edema.     Left lower leg: No edema.  Lymphadenopathy:     Cervical: No cervical adenopathy.  Neurological:     Mental Status: He is alert.     Comments: Strength-- 2+/5 in left arm---good grip strength Right arm-- 4/5 Legs 2+/5  Psychiatric:        Mood and Affect: Mood normal.        Behavior: Behavior normal.            Assessment & Plan:

## 2021-12-11 NOTE — Telephone Encounter (Signed)
Will hold on sending the [facility] until after Dr. Tasia Catchings gives the go ahead. Patient would be eligible for a copay card to help bring down the cost if needed.

## 2021-12-11 NOTE — Assessment & Plan Note (Signed)
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

## 2021-12-11 NOTE — Assessment & Plan Note (Signed)
Mostly reactive Is on fluoxetine but can probably try to wean in the near future

## 2021-12-11 NOTE — Telephone Encounter (Signed)
Please schedule and inform pt of appts:   [place] left lower ext in Mid NOV Lab/MD a few days after [place].   Keep Injection in Dec as scheduled.

## 2021-12-11 NOTE — Telephone Encounter (Signed)
Oral Oncology Patient Advocate Encounter   Received notification that prior authorization for Gillermina Phy is required.   PA submitted on 10.18.23  Key [id number]  Status is pending     [name], [place] Patient [place]  [phone] (phone) [phone] (fax) 12/11/2021 9:18 AM

## 2021-12-11 NOTE — Assessment & Plan Note (Signed)
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

## 2021-12-12 ENCOUNTER — Encounter (HOSPITAL_COMMUNITY)
Admission: RE | Admit: 2021-12-12 | Discharge: 2021-12-12 | Disposition: A | Discharge status: Home / Self Care | Payer: BC Managed Care – PPO | Source: Ambulatory Visit | Attending: Oncology | Admitting: Oncology

## 2021-12-12 DIAGNOSIS — C61 Malignant neoplasm of prostate: Secondary | ICD-10-CM | POA: Insufficient documentation

## 2021-12-12 DIAGNOSIS — G8929 Other chronic pain: Secondary | ICD-10-CM

## 2021-12-12 DIAGNOSIS — J9691 Respiratory failure, unspecified with hypoxia: Secondary | ICD-10-CM | POA: Diagnosis not present

## 2021-12-12 DIAGNOSIS — M1990 Unspecified osteoarthritis, unspecified site: Secondary | ICD-10-CM | POA: Diagnosis not present

## 2021-12-12 DIAGNOSIS — J9692 Respiratory failure, unspecified with hypercapnia: Secondary | ICD-10-CM | POA: Diagnosis not present

## 2021-12-12 DIAGNOSIS — D630 Anemia in neoplastic disease: Secondary | ICD-10-CM | POA: Diagnosis not present

## 2021-12-12 DIAGNOSIS — E039 Hypothyroidism, unspecified: Secondary | ICD-10-CM | POA: Diagnosis not present

## 2021-12-12 DIAGNOSIS — G8254 Quadriplegia, C5-C7 incomplete: Secondary | ICD-10-CM | POA: Diagnosis not present

## 2021-12-12 DIAGNOSIS — Z8701 Personal history of pneumonia (recurrent): Secondary | ICD-10-CM

## 2021-12-12 DIAGNOSIS — K219 Gastro-esophageal reflux disease without esophagitis: Secondary | ICD-10-CM | POA: Diagnosis not present

## 2021-12-12 DIAGNOSIS — Z96612 Presence of left artificial shoulder joint: Secondary | ICD-10-CM

## 2021-12-12 DIAGNOSIS — N200 Calculus of kidney: Secondary | ICD-10-CM

## 2021-12-12 DIAGNOSIS — I471 Supraventricular tachycardia, unspecified: Secondary | ICD-10-CM

## 2021-12-12 DIAGNOSIS — I493 Ventricular premature depolarization: Secondary | ICD-10-CM

## 2021-12-12 DIAGNOSIS — C7951 Secondary malignant neoplasm of bone: Secondary | ICD-10-CM | POA: Diagnosis not present

## 2021-12-12 DIAGNOSIS — Z9089 Acquired absence of other organs: Secondary | ICD-10-CM

## 2021-12-12 DIAGNOSIS — I959 Hypotension, unspecified: Secondary | ICD-10-CM | POA: Diagnosis not present

## 2021-12-12 DIAGNOSIS — Z791 Long term (current) use of non-steroidal anti-inflammatories (NSAID): Secondary | ICD-10-CM

## 2021-12-12 DIAGNOSIS — I6611 Occlusion and stenosis of right anterior cerebral artery: Secondary | ICD-10-CM

## 2021-12-12 DIAGNOSIS — M542 Cervicalgia: Secondary | ICD-10-CM

## 2021-12-12 DIAGNOSIS — Z955 Presence of coronary angioplasty implant and graft: Secondary | ICD-10-CM

## 2021-12-12 DIAGNOSIS — Z7901 Long term (current) use of anticoagulants: Secondary | ICD-10-CM

## 2021-12-12 DIAGNOSIS — Z8673 Personal history of transient ischemic attack (TIA), and cerebral infarction without residual deficits: Secondary | ICD-10-CM

## 2021-12-12 DIAGNOSIS — R339 Retention of urine, unspecified: Secondary | ICD-10-CM | POA: Diagnosis not present

## 2021-12-12 DIAGNOSIS — I10 Essential (primary) hypertension: Secondary | ICD-10-CM | POA: Diagnosis not present

## 2021-12-12 DIAGNOSIS — Z993 Dependence on wheelchair: Secondary | ICD-10-CM

## 2021-12-12 DIAGNOSIS — N400 Enlarged prostate without lower urinary tract symptoms: Secondary | ICD-10-CM | POA: Diagnosis not present

## 2021-12-12 DIAGNOSIS — Z9181 History of falling: Secondary | ICD-10-CM

## 2021-12-12 DIAGNOSIS — F419 Anxiety disorder, unspecified: Secondary | ICD-10-CM | POA: Diagnosis not present

## 2021-12-12 DIAGNOSIS — Z960 Presence of urogenital implants: Secondary | ICD-10-CM

## 2021-12-12 DIAGNOSIS — I200 Unstable angina: Secondary | ICD-10-CM | POA: Diagnosis not present

## 2021-12-12 DIAGNOSIS — D509 Iron deficiency anemia, unspecified: Secondary | ICD-10-CM | POA: Diagnosis not present

## 2021-12-12 DIAGNOSIS — Z96643 Presence of artificial hip joint, bilateral: Secondary | ICD-10-CM

## 2021-12-12 DIAGNOSIS — C7940 Secondary malignant neoplasm of unspecified part of nervous system: Secondary | ICD-10-CM | POA: Diagnosis not present

## 2021-12-12 MED ORDER — PIFLIFOLASTAT F 18 (PYLARIFY) INJECTION
9.0000 | Freq: Once | INTRAVENOUS | Status: AC
  Administered 2021-12-12: 8.5 via INTRAVENOUS

## 2021-12-13 DIAGNOSIS — G8254 Quadriplegia, C5-C7 incomplete: Secondary | ICD-10-CM | POA: Diagnosis not present

## 2021-12-13 DIAGNOSIS — I10 Essential (primary) hypertension: Secondary | ICD-10-CM | POA: Diagnosis not present

## 2021-12-13 DIAGNOSIS — J9691 Respiratory failure, unspecified with hypoxia: Secondary | ICD-10-CM | POA: Diagnosis not present

## 2021-12-13 DIAGNOSIS — N400 Enlarged prostate without lower urinary tract symptoms: Secondary | ICD-10-CM | POA: Diagnosis not present

## 2021-12-13 DIAGNOSIS — I959 Hypotension, unspecified: Secondary | ICD-10-CM | POA: Diagnosis not present

## 2021-12-13 DIAGNOSIS — K219 Gastro-esophageal reflux disease without esophagitis: Secondary | ICD-10-CM | POA: Diagnosis not present

## 2021-12-13 DIAGNOSIS — F419 Anxiety disorder, unspecified: Secondary | ICD-10-CM | POA: Diagnosis not present

## 2021-12-13 DIAGNOSIS — C61 Malignant neoplasm of prostate: Secondary | ICD-10-CM | POA: Diagnosis not present

## 2021-12-13 DIAGNOSIS — J9692 Respiratory failure, unspecified with hypercapnia: Secondary | ICD-10-CM | POA: Diagnosis not present

## 2021-12-13 DIAGNOSIS — D509 Iron deficiency anemia, unspecified: Secondary | ICD-10-CM | POA: Diagnosis not present

## 2021-12-13 DIAGNOSIS — C7940 Secondary malignant neoplasm of unspecified part of nervous system: Secondary | ICD-10-CM | POA: Diagnosis not present

## 2021-12-13 DIAGNOSIS — D630 Anemia in neoplastic disease: Secondary | ICD-10-CM | POA: Diagnosis not present

## 2021-12-13 DIAGNOSIS — E039 Hypothyroidism, unspecified: Secondary | ICD-10-CM | POA: Diagnosis not present

## 2021-12-13 DIAGNOSIS — C7951 Secondary malignant neoplasm of bone: Secondary | ICD-10-CM | POA: Diagnosis not present

## 2021-12-13 DIAGNOSIS — R339 Retention of urine, unspecified: Secondary | ICD-10-CM | POA: Diagnosis not present

## 2021-12-13 DIAGNOSIS — M1990 Unspecified osteoarthritis, unspecified site: Secondary | ICD-10-CM | POA: Diagnosis not present

## 2021-12-13 DIAGNOSIS — I200 Unstable angina: Secondary | ICD-10-CM | POA: Diagnosis not present

## 2021-12-14 DIAGNOSIS — I959 Hypotension, unspecified: Secondary | ICD-10-CM | POA: Diagnosis not present

## 2021-12-14 DIAGNOSIS — G8254 Quadriplegia, C5-C7 incomplete: Secondary | ICD-10-CM | POA: Diagnosis not present

## 2021-12-14 DIAGNOSIS — K219 Gastro-esophageal reflux disease without esophagitis: Secondary | ICD-10-CM | POA: Diagnosis not present

## 2021-12-14 DIAGNOSIS — M1990 Unspecified osteoarthritis, unspecified site: Secondary | ICD-10-CM | POA: Diagnosis not present

## 2021-12-14 DIAGNOSIS — N400 Enlarged prostate without lower urinary tract symptoms: Secondary | ICD-10-CM | POA: Diagnosis not present

## 2021-12-14 DIAGNOSIS — D630 Anemia in neoplastic disease: Secondary | ICD-10-CM | POA: Diagnosis not present

## 2021-12-14 DIAGNOSIS — J9691 Respiratory failure, unspecified with hypoxia: Secondary | ICD-10-CM | POA: Diagnosis not present

## 2021-12-14 DIAGNOSIS — C7951 Secondary malignant neoplasm of bone: Secondary | ICD-10-CM | POA: Diagnosis not present

## 2021-12-14 DIAGNOSIS — I200 Unstable angina: Secondary | ICD-10-CM | POA: Diagnosis not present

## 2021-12-14 DIAGNOSIS — F419 Anxiety disorder, unspecified: Secondary | ICD-10-CM | POA: Diagnosis not present

## 2021-12-14 DIAGNOSIS — C7940 Secondary malignant neoplasm of unspecified part of nervous system: Secondary | ICD-10-CM | POA: Diagnosis not present

## 2021-12-14 DIAGNOSIS — J9692 Respiratory failure, unspecified with hypercapnia: Secondary | ICD-10-CM | POA: Diagnosis not present

## 2021-12-14 DIAGNOSIS — C61 Malignant neoplasm of prostate: Secondary | ICD-10-CM | POA: Diagnosis not present

## 2021-12-14 DIAGNOSIS — I10 Essential (primary) hypertension: Secondary | ICD-10-CM | POA: Diagnosis not present

## 2021-12-14 DIAGNOSIS — D509 Iron deficiency anemia, unspecified: Secondary | ICD-10-CM | POA: Diagnosis not present

## 2021-12-14 DIAGNOSIS — E039 Hypothyroidism, unspecified: Secondary | ICD-10-CM | POA: Diagnosis not present

## 2021-12-16 ENCOUNTER — Telehealth: Payer: Self-pay | Admitting: Internal Medicine

## 2021-12-16 ENCOUNTER — Encounter: Payer: Self-pay | Admitting: Internal Medicine

## 2021-12-16 DIAGNOSIS — I10 Essential (primary) hypertension: Secondary | ICD-10-CM | POA: Diagnosis not present

## 2021-12-16 DIAGNOSIS — J9692 Respiratory failure, unspecified with hypercapnia: Secondary | ICD-10-CM | POA: Diagnosis not present

## 2021-12-16 DIAGNOSIS — D630 Anemia in neoplastic disease: Secondary | ICD-10-CM | POA: Diagnosis not present

## 2021-12-16 DIAGNOSIS — C7951 Secondary malignant neoplasm of bone: Secondary | ICD-10-CM | POA: Diagnosis not present

## 2021-12-16 DIAGNOSIS — C61 Malignant neoplasm of prostate: Secondary | ICD-10-CM | POA: Diagnosis not present

## 2021-12-16 DIAGNOSIS — M1990 Unspecified osteoarthritis, unspecified site: Secondary | ICD-10-CM | POA: Diagnosis not present

## 2021-12-16 DIAGNOSIS — D509 Iron deficiency anemia, unspecified: Secondary | ICD-10-CM | POA: Diagnosis not present

## 2021-12-16 DIAGNOSIS — I959 Hypotension, unspecified: Secondary | ICD-10-CM | POA: Diagnosis not present

## 2021-12-16 DIAGNOSIS — N400 Enlarged prostate without lower urinary tract symptoms: Secondary | ICD-10-CM | POA: Diagnosis not present

## 2021-12-16 DIAGNOSIS — F419 Anxiety disorder, unspecified: Secondary | ICD-10-CM | POA: Diagnosis not present

## 2021-12-16 DIAGNOSIS — E039 Hypothyroidism, unspecified: Secondary | ICD-10-CM | POA: Diagnosis not present

## 2021-12-16 DIAGNOSIS — I200 Unstable angina: Secondary | ICD-10-CM | POA: Diagnosis not present

## 2021-12-16 DIAGNOSIS — G8254 Quadriplegia, C5-C7 incomplete: Secondary | ICD-10-CM | POA: Diagnosis not present

## 2021-12-16 DIAGNOSIS — K219 Gastro-esophageal reflux disease without esophagitis: Secondary | ICD-10-CM | POA: Diagnosis not present

## 2021-12-16 DIAGNOSIS — J9691 Respiratory failure, unspecified with hypoxia: Secondary | ICD-10-CM | POA: Diagnosis not present

## 2021-12-16 DIAGNOSIS — C7940 Secondary malignant neoplasm of unspecified part of nervous system: Secondary | ICD-10-CM | POA: Diagnosis not present

## 2021-12-16 NOTE — Telephone Encounter (Signed)
Home Health verbal orders Caller Name: [place] Name: [facility]  Callback number:   Requesting OT/PT/Skilled nursing/Social Work/Speech: OT  Reason: adls,transfer,strengthening  Frequency: 1 time wk for 5 wks  Please forward to [facility] pool or providers CMA

## 2021-12-17 ENCOUNTER — Ambulatory Visit
Admission: RE | Admit: 2021-12-17 | Discharge: 2021-12-17 | Disposition: A | Discharge status: Home / Self Care | Payer: BC Managed Care – PPO | Source: Ambulatory Visit | Attending: Radiation Oncology | Admitting: Radiation Oncology

## 2021-12-17 ENCOUNTER — Encounter: Payer: Self-pay | Admitting: Internal Medicine

## 2021-12-17 NOTE — Progress Notes (Signed)
  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     Indication for treatment:  Palliation       Radiation treatment dates:   10/31/21 - 11/13/21  Site/dose:   The involved c-spine including C4 down to T2 was treated to 30 Gy in 10 fractions.   Beams/energy:   A 3D field set-up was employed with 6 MV X-rays  Narrative: The patient tolerated radiation treatment relatively well without any ill side effects.  Plan: The patient has completed radiation treatment. The patient will return to radiation oncology clinic for routine followup in one month. I advised him to call or return sooner if he has any questions or concerns related to his recovery or treatment. ________________________________  Sheral Apley. Tammi Klippel, M.D.

## 2021-12-17 NOTE — Telephone Encounter (Signed)
Verbal orders given to stephanie. 

## 2021-12-17 NOTE — Progress Notes (Signed)
  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      Indication for treatment:  Palliation        Radiation treatment dates:   10/31/21 - 11/13/21(as documented in provider EOT note)   Pre Treatment IPSS Score: Patient uses a urinary catheter 100% of the time.  The patient was available for call today.   Symptoms of fatigue have improved since completing therapy.  Symptoms of bladder changes HAVE  improved since completing therapy. Current symptoms include a thick, white, mucus-like discharge in urine when emptying the bag, and medications for bladder symptoms include Tamsulosin (Flomax).  Symptoms of bowel changes have NOT improved since completing therapy. Patient states "He is on a "Bowel Program" and is managing well. Current symptoms include bowel incontinence, and medications for bowel symptoms include Colace.   Discussed w/ patient to only use the stool softener according to his "Bowel Program" instructions.  Post Treatment IPSS Score: N/A IPSS Questionnaire (AUA-7): Over the past month.   1)  How often have you had a sensation of not emptying your bladder completely after you finish urinating?  0 - Not at all  2)  How often have you had to urinate again less than two hours after you finished urinating? 0 - Not at all  3)  How often have you found you stopped and started again several times when you urinated?  0 - Not at all  4) How difficult have you found it to postpone urination?  0 - Not at all  5) How often have you had a weak urinary stream?  0 - Not at all  6) How often have you had to push or strain to begin urination?  0 - Not at all  7) How  many times did you most typically get up to urinate from the time you went to bed until the time you got up in the morning?  0 - None  Total score:  0 Which indicates N/A 0-7 mildly symptomatic   8-19 moderately symptomatic   20-35 severely symptomatic    Patient was advised to call to schedule their post-treatment follow up with his urologist Dr. Nickolas Madrid at [facility] for ongoing surveillance. He was counseled that PSA levels will be drawn in his urologist office, and was reassured that additional time is expected to improve bowel and bladder symptoms. He was encouraged to call back with concerns or questions regarding radiation. Patient verbalized understanding of information.  This concludes the nursing Post-Treat-Call   Leandra Kern, LPN

## 2021-12-18 DIAGNOSIS — I959 Hypotension, unspecified: Secondary | ICD-10-CM | POA: Diagnosis not present

## 2021-12-18 DIAGNOSIS — J9691 Respiratory failure, unspecified with hypoxia: Secondary | ICD-10-CM | POA: Diagnosis not present

## 2021-12-18 DIAGNOSIS — J9692 Respiratory failure, unspecified with hypercapnia: Secondary | ICD-10-CM | POA: Diagnosis not present

## 2021-12-18 DIAGNOSIS — K219 Gastro-esophageal reflux disease without esophagitis: Secondary | ICD-10-CM | POA: Diagnosis not present

## 2021-12-18 DIAGNOSIS — N400 Enlarged prostate without lower urinary tract symptoms: Secondary | ICD-10-CM | POA: Diagnosis not present

## 2021-12-18 DIAGNOSIS — E039 Hypothyroidism, unspecified: Secondary | ICD-10-CM | POA: Diagnosis not present

## 2021-12-18 DIAGNOSIS — D630 Anemia in neoplastic disease: Secondary | ICD-10-CM | POA: Diagnosis not present

## 2021-12-18 DIAGNOSIS — G8254 Quadriplegia, C5-C7 incomplete: Secondary | ICD-10-CM | POA: Diagnosis not present

## 2021-12-18 DIAGNOSIS — D509 Iron deficiency anemia, unspecified: Secondary | ICD-10-CM | POA: Diagnosis not present

## 2021-12-18 DIAGNOSIS — I10 Essential (primary) hypertension: Secondary | ICD-10-CM | POA: Diagnosis not present

## 2021-12-18 DIAGNOSIS — M1990 Unspecified osteoarthritis, unspecified site: Secondary | ICD-10-CM | POA: Diagnosis not present

## 2021-12-18 DIAGNOSIS — G8250 Quadriplegia, unspecified: Secondary | ICD-10-CM | POA: Diagnosis not present

## 2021-12-18 DIAGNOSIS — F419 Anxiety disorder, unspecified: Secondary | ICD-10-CM | POA: Diagnosis not present

## 2021-12-18 DIAGNOSIS — C61 Malignant neoplasm of prostate: Secondary | ICD-10-CM | POA: Diagnosis not present

## 2021-12-18 DIAGNOSIS — C7940 Secondary malignant neoplasm of unspecified part of nervous system: Secondary | ICD-10-CM | POA: Diagnosis not present

## 2021-12-18 DIAGNOSIS — C7951 Secondary malignant neoplasm of bone: Secondary | ICD-10-CM | POA: Diagnosis not present

## 2021-12-18 DIAGNOSIS — I200 Unstable angina: Secondary | ICD-10-CM | POA: Diagnosis not present

## 2021-12-18 MED ORDER — ATORVASTATIN CALCIUM 40 MG PO TABS: 40.0000 mg | ORAL_TABLET | Freq: Every day | ORAL | 3 refills | Status: DC

## 2021-12-18 MED ORDER — CLONAZEPAM 0.5 MG PO TABS: 0.5000 mg | ORAL_TABLET | Freq: Two times a day (BID) | ORAL | 0 refills | Status: DC

## 2021-12-20 ENCOUNTER — Inpatient Hospital Stay (HOSPITAL_BASED_OUTPATIENT_CLINIC_OR_DEPARTMENT_OTHER): Payer: BC Managed Care – PPO | Admitting: Internal Medicine

## 2021-12-20 ENCOUNTER — Encounter: Payer: Self-pay | Admitting: Internal Medicine

## 2021-12-20 VITALS — BP 139/74 | HR 64 | Temp 98.7°F | Resp 19

## 2021-12-20 DIAGNOSIS — C61 Malignant neoplasm of prostate: Secondary | ICD-10-CM | POA: Diagnosis not present

## 2021-12-20 DIAGNOSIS — K219 Gastro-esophageal reflux disease without esophagitis: Secondary | ICD-10-CM | POA: Diagnosis not present

## 2021-12-20 DIAGNOSIS — N319 Neuromuscular dysfunction of bladder, unspecified: Secondary | ICD-10-CM | POA: Diagnosis not present

## 2021-12-20 DIAGNOSIS — C7940 Secondary malignant neoplasm of unspecified part of nervous system: Secondary | ICD-10-CM

## 2021-12-20 DIAGNOSIS — C770 Secondary and unspecified malignant neoplasm of lymph nodes of head, face and neck: Secondary | ICD-10-CM | POA: Diagnosis not present

## 2021-12-20 DIAGNOSIS — G8250 Quadriplegia, unspecified: Secondary | ICD-10-CM | POA: Diagnosis not present

## 2021-12-20 DIAGNOSIS — M48061 Spinal stenosis, lumbar region without neurogenic claudication: Secondary | ICD-10-CM | POA: Diagnosis not present

## 2021-12-20 DIAGNOSIS — Z86718 Personal history of other venous thrombosis and embolism: Secondary | ICD-10-CM | POA: Diagnosis not present

## 2021-12-20 DIAGNOSIS — M47816 Spondylosis without myelopathy or radiculopathy, lumbar region: Secondary | ICD-10-CM | POA: Diagnosis not present

## 2021-12-20 DIAGNOSIS — Z7901 Long term (current) use of anticoagulants: Secondary | ICD-10-CM | POA: Diagnosis not present

## 2021-12-20 DIAGNOSIS — I700 Atherosclerosis of aorta: Secondary | ICD-10-CM | POA: Diagnosis not present

## 2021-12-20 DIAGNOSIS — Z79899 Other long term (current) drug therapy: Secondary | ICD-10-CM | POA: Diagnosis not present

## 2021-12-20 DIAGNOSIS — I10 Essential (primary) hypertension: Secondary | ICD-10-CM | POA: Diagnosis not present

## 2021-12-20 DIAGNOSIS — C7951 Secondary malignant neoplasm of bone: Secondary | ICD-10-CM | POA: Diagnosis not present

## 2021-12-20 DIAGNOSIS — Z79890 Hormone replacement therapy (postmenopausal): Secondary | ICD-10-CM | POA: Diagnosis not present

## 2021-12-20 DIAGNOSIS — M4802 Spinal stenosis, cervical region: Secondary | ICD-10-CM | POA: Diagnosis not present

## 2021-12-20 NOTE — Progress Notes (Signed)
[facility] [place], [place] [address] [phone]   New Patient Evaluation  Date of Service: 12/20/21 Patient Name: Jason [name] Patient MRN: [id number] Patient DOB: Jul 11, 1955 Provider: Ventura Sellers, MD  Identifying Statement:  ONAJE [name] is a 66 y.o. 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  Associated epidural disease greater at the C6 where there is marked canal stenosis without cord compression.  Left greater than right foraminal effacement due to the extraosseous disease.   10/03/2021 Imaging   MRI left shoulder showed High-riding humeral head with massive full-thickness tear of the entire supraspinatus tendon and the anterior 50% of the infraspinatus tendon. Additional partial-thickness articular sided tearing of the posterior infraspinatus. Moderate to high-grade supraspinatus and infraspinatus muscle atrophy suggests these tears  are chronic. Partial-thickness tearing of the superior greater than inferior  aspects of the subscapularis tendon footprint. Mild subscapularis muscle atrophy.  Moderate degenerative changes of the acromioclavicular joint. Moderate glenohumeral cartilage degenerative changes.   10/05/2021 Imaging   CT chest abdomen pelvis with contrast showed  Moderate severity retroperitoneal and pelvic lymphadenopathy,consistent with metastatic disease. Small lytic areas at the levels of L4 and S1, with  diffusely sclerotic changes involving the second and fourth right ribs, sacrum and left iliac bone. 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  Sigmoid diverticulosis. Aortic atherosclerosis   10/05/2021 Imaging   CT cervical spine without contrast Extensive heterogeneous sclerotic lesions throughout the cervical spine compatible with metastatic disease to the bone.  Mixed sclerotic and lytic changes in the posterior elements at C5 and C6.Extraosseous tumor at C5 and C6 is better appreciated on the MRI scan.   10/05/2021 Tumor Marker   PSA >1500   10/06/2021 Cancer Staging   Staging form: Prostate, AJCC 8th Edition - Clinical stage from 10/06/2021: Stage IVB (cN1, pM1c, PSA: 1500) - Signed by Earlie Server, MD on 12/04/2021 Stage prefix: Initial diagnosis Prostate specific antigen (PSA) range: 20 or greater   10/08/2021 Imaging   MRI thoracic spine and the lumbar spine with and without contrast 1. 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 2. 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 3. Additional epidural tumor along the posterior endplates at G6,Y40, and T12 without high-grade spinal canal stenosis or cord compression. 4. Extensive epidural tumor in the lumbar spine is greatest in thickness at L1 without high-grade spinal canal stenosis at this level; however, superimposed on pre-existing degenerative changes lower in the lumbar spine results in severe spinal canal stenosis with cauda equina nerve root compression at L3-L4 and L4-L5. Multilevel neural foraminal stenosis is detailed above. 5. Postsurgical changes  reflecting C6 laminectomy unchanged spinal canal stenosis compared to the postoperative study from 1 day prior.No definite cord signal abnormality.  6. Extramedullary fluid collection along the dorsal aspect of the cord beginning at T9-T10 extending into the lumbar spine is likely subdural in location but is of uncertain etiology; evolving blood products not excluded. The collection anteriorly displaces the cord and cauda equina nerve roots without frank cord compression or signal abnormality. There is no peripheral enhancement to suggest abscess. 7. Prevertebral edema in the cervical spine which may be postsurgical in nature. 8. New bilateral lower lobe consolidations may reflect atelectasis or developing pneumonia/aspiration.   10/08/2021 Imaging   MRI brain with and without contrast showed 1. 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 There is intracranial extension of tumor along the right frontal convexity measuring up to 5 mm in thickness without mass effect on the underlying brain parenchyma or midline shift. Extracranial extension of tumor in this location measures up to approximately 3 mm. 2. Additional extracranial extension of tumor into the scalp along the vertex measuring up to 7 mm in thickness. 3. Small focus of diffusion restriction in the right parietal cortex most in keeping with a small acute infarct. 4. No evidence of parenchymal metastatic disease.   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)  PSA >1500, patient initially presented with extremity weakness. 10/06/2021, patient underwent C6 cervical laminectomy and resection of tumor.  Pathology came back positive for metastatic prostate cancer. 8/14 developed significant bilateral upper and lower extremity weakness with hypotension requiring transfer to the ICU, pressors, steroid, respiratory failure on mechanical ventilation. Later patient  was stabilized and extubated.  8/16 Firmagon loading dose '240mg'$   9/15 Eligard 22.'5mg'$  given inpatient during his rehab admission.     10/2021 -  Radiation Therapy   Palliative radiation to spine.    12/04/2021 Tumor Marker   PSA 67.09    CNS Oncologic History 10/06/21: Laminectomy, resection of epidural tumor ~C6 with Dr. Cari Caraway.   11/13/21: Post-op radiation, 30/10 from C4 to T2 with Dr. Tammi Klippel  History of Present Illness: The patient's records from the referring physician were obtained and reviewed and the patient interviewed to confirm this HPI.  [name] presented to neurologic attention in August with new onset numbness in left arm and hand.  This had been proceeded by several months of pain in back of his neck.  Spine imaging demonstrated diffuse metastatic burden and tumor infiltrating spinal column in cervical region.  He underwent laminectomy and debulking of epidural tumor with Dr. Cari Caraway on 10/06/21 at [place].  This was then followed by post-operative radiation therapy with Dr. Tammi Klippel in [place], also to cervical and upper thoracic region, complete 9/20 after 10 sessions.  No surgery or radiation has been focused on lower spine to this point.  At present, he is wheelchair bound due to bilateral leg weakness, which is improving gradually through aggressive PT.  His arms and hands are functional aside from more chronic weakness in his left shoulder.  He is incontinent of urine and has indwelling foley, but he does have bladder and saddle sensation intact.     Medications: Current Outpatient Medications on File Prior to Visit  Medication Sig Dispense Refill   acetaminophen (TYLENOL) 325 MG tablet Take 1-2 tablets (325-650 mg total) by mouth every 4 (four) hours as needed for mild pain.     apixaban (ELIQUIS) 5 MG TABS tablet Take 1 tablet (5 mg total) by mouth 2 (two) times daily. 60 tablet 5   ascorbic acid (VITAMIN C) 500  MG tablet Take 1 tablet (500 mg total) by  mouth daily. 30 tablet 0   aspirin EC 81 MG tablet Take 1 tablet (81 mg total) by mouth daily. Swallow whole. 30 tablet 12   atorvastatin (LIPITOR) 40 MG tablet Take 1 tablet (40 mg total) by mouth daily. 90 tablet 3   bisacodyl (DULCOLAX) 10 MG suppository Place 1 suppository (10 mg total) rectally daily as needed for moderate constipation. (Patient not taking: Reported on 12/19/2021) 12 suppository 0   cetirizine (ZYRTEC) 10 MG tablet Take 10 mg by mouth at bedtime.     clonazePAM (KLONOPIN) 0.5 MG tablet Take 1 tablet (0.5 mg total) by mouth 2 (two) times daily. 60 tablet 0   docusate sodium (COLACE) 100 MG capsule Take 2 capsules (200 mg total) by mouth daily. 10 capsule 0   famotidine (PEPCID) 20 MG tablet Take 1 tablet (20 mg total) by mouth daily. 30 tablet 0   FLUoxetine (PROZAC) 20 MG capsule Take 1 capsule (20 mg total) by mouth at bedtime. 30 capsule 0   fluticasone (FLONASE) 50 MCG/ACT nasal spray Place 1 spray into both nostrils daily. 16 g 0   Iron Polysacch Cmplx-B12-FA (POLY-IRON 150 FORTE) 150-0.025-1 MG CAPS Take 150 mg by mouth daily. 30 capsule 0   levothyroxine (SYNTHROID) 88 MCG tablet Take 1 tablet (88 mcg total) by mouth daily. 30 tablet 0   meloxicam (MOBIC) 15 MG tablet Take 1 tablet (15 mg total) by mouth daily. 30 tablet 0   Multiple Vitamin (MULTIVITAMIN) tablet Take 1 tablet by mouth daily.     polyethylene glycol (MIRALAX / GLYCOLAX) 17 g packet Take 17 g by mouth daily as needed for mild constipation. 14 each 0   potassium chloride (KLOR-CON M) 10 MEQ tablet Take 1 tablet (10 mEq total) by mouth at bedtime. 30 tablet 0   tamsulosin (FLOMAX) 0.4 MG CAPS capsule Take 1 capsule (0.4 mg total) by mouth daily after supper. 30 capsule 0   [DISCONTINUED] iron polysaccharides (NIFEREX) 150 MG capsule Take 1 capsule (150 mg total) by mouth daily. 30 capsule 3   No current facility-administered medications on file prior to visit.    Allergies: No Known Allergies Past  Medical History:  Past Medical History:  Diagnosis Date   Allergy    Arthritis    osteoarthritis   GERD (gastroesophageal reflux disease)    Hypertension    Hypothyroidism    Renal calculi    Thyroid disease    Hyperthyroidism s/p radioactive iodine ablation   Past Surgical History:  Past Surgical History:  Procedure Laterality Date   CARDIAC CATHETERIZATION  03/2000   HERNIA REPAIR  2505   umbilical   LEFT HEART CATH AND CORONARY ANGIOGRAPHY Left 03/20/2020   Procedure: LEFT HEART CATH AND CORONARY ANGIOGRAPHY;  Surgeon: Nelva Bush, MD;  Location: [facility];  Service: Cardiovascular;  Laterality: Left;   POSTERIOR CERVICAL FUSION/FORAMINOTOMY N/A 10/06/2021   Procedure: POSTERIOR CERVICAL FUSION/ FORAMINOTOMY LEVEL 3;  Surgeon: Meade Maw, MD;  Location: [facility];  Service: Neurosurgery;  Laterality: N/A;  will need monitoring   RADIOLOGY WITH ANESTHESIA Left 04/18/2021   Procedure: MRI SHOULDER WITHOUT CONTRAST WITH ANESTHESIA;  Surgeon: Radiologist, Medication, MD;  Location: [facility];  Service: Radiology;  Laterality: Left;   RADIOLOGY WITH ANESTHESIA N/A 10/03/2021   Procedure: MRI CERVICAL SPINE WITH ANESTHESIA;  Surgeon: Radiologist, Medication, MD;  Location: [facility];  Service: Radiology;  Laterality: N/A;   REVERSE SHOULDER ARTHROPLASTY Left  ROTATOR CUFF REPAIR  11/2010   Dr [name] Heck   TONSILLECTOMY     TOTAL HIP ARTHROPLASTY Right 02/24/2009   TOTAL HIP ARTHROPLASTY Left 06/2016   Dr Harlow Mares   Social History:  Social History   Socioeconomic History   Marital status: Married    Spouse name: [name]    Number of children: 0   Years of education: Not on file   Highest education level: Not on file  Occupational History   Occupation: Therapist, occupational: [employer]  Tobacco Use   Smoking status: Never   Smokeless tobacco: Never  Vaping Use   Vaping Use: Never used  Substance and Sexual Activity   Alcohol use: Not Currently     Comment: Alcohol once every few weeks/months   Drug use: No   Sexual activity: Yes  Other Topics Concern   Not on file  Social History Narrative   Lives at home with spouse.    Social Determinants of Health   Financial Resource Strain: Not on file  Food Insecurity: Not on file  Transportation Needs: Not on file  Physical Activity: Not on file  Stress: Not on file  Social Connections: Not on file  Intimate Partner Violence: Not on file   Family History:  Family History  Problem Relation Age of Onset   Hypertension Mother    Stroke Mother    Gout Father    Heart attack Father 3   Heart disease Sister    Lung cancer Sister    Drug abuse Sister    Breast cancer Paternal Aunt    Lung cancer Maternal Grandmother    Colon cancer Neg Hx    Esophageal cancer Neg Hx    Rectal cancer Neg Hx    Stomach cancer Neg Hx     Review of Systems: Constitutional: Doesn't report fevers, chills or abnormal weight loss Eyes: Doesn't report blurriness of vision Ears, nose, mouth, throat, and face: Doesn't report sore throat Respiratory: Doesn't report cough, dyspnea or wheezes Cardiovascular: Doesn't report palpitation, chest discomfort  Gastrointestinal:  Doesn't report nausea, constipation, diarrhea GU: Doesn't report incontinence Skin: Doesn't report skin rashes Neurological: Per HPI Musculoskeletal: Doesn't report joint pain Behavioral/Psych: Doesn't report anxiety  Physical Exam: Vitals:   12/20/21 0931  BP: 139/74  Pulse: 64  Resp: 19  Temp: 98.7 F (37.1 C)  SpO2: 97%   KPS: 50. General: Alert, cooperative, pleasant, in no acute distress Head: Normal EENT: No conjunctival injection or scleral icterus.  Lungs: Resp effort normal Cardiac: Regular rate Abdomen: indwelling foley Skin: No rashes cyanosis or petechiae. Extremities: No clubbing or edema  Neurologic Exam: Mental Status: Awake, alert, attentive to examiner. Oriented to self and environment. Language is  fluent with intact comprehension.  Cranial Nerves: Visual acuity is grossly normal. Visual fields are full. Extra-ocular movements intact. No ptosis. Face is symmetric Motor: Spastic tone.  Left arm 3/5 at deltoid, 5/5 elsewhere.  Some impairment in fine motor function.  Legs 3/5 bilaterally, spastic and hyperreflexic. Sensory: Intact to light touch Gait: Non ambulatory   Labs: I have reviewed the data as listed    Component Value Date/Time   NA 137 12/04/2021 0957   NA 140 01/29/2021 0822   K 3.6 12/04/2021 0957   CL 102 12/04/2021 0957   CO2 27 12/04/2021 0957   GLUCOSE 95 12/04/2021 0957   BUN 9 12/04/2021 0957   BUN 16 01/29/2021 0822   CREATININE 0.42 (L) 12/04/2021 0957   CALCIUM 9.1  12/04/2021 0957   PROT 7.3 12/04/2021 0957   PROT 7.2 01/29/2021 0822   ALBUMIN 3.7 12/04/2021 0957   ALBUMIN 4.4 01/29/2021 0822   AST 36 12/04/2021 0957   ALT 38 12/04/2021 0957   ALKPHOS 320 (H) 12/04/2021 0957   BILITOT 0.8 12/04/2021 0957   BILITOT 1.1 01/29/2021 0822   GFRNONAA >60 12/04/2021 0957   GFRAA 106 03/14/2020 1254   Lab Results  Component Value Date   WBC 5.6 12/04/2021   NEUTROABS 4.1 12/04/2021   HGB 13.9 12/04/2021   HCT 41.9 12/04/2021   MCV 94.4 12/04/2021   PLT 274 12/04/2021    Imaging:  NM PET (PSMA) SKULL TO MID THIGH  Result Date: 12/13/2021 CLINICAL DATA:  Prostate cancer with metastasis to C6, laminectomy in August. Radiation to spine. PSA greater than 1500 EXAM: NUCLEAR MEDICINE PET SKULL BASE TO THIGH TECHNIQUE: 8.5 mCi F18 Piflufolastat (Pylarify) was injected intravenously. Full-ring PET imaging was performed from the skull base to thigh after the radiotracer. CT data was obtained and used for attenuation correction and anatomic localization. COMPARISON:  Chest abdomen and pelvic CTs of 10/05/2021. Cervical spine CT of 10/05/2021. FINDINGS: NECK Isolated low left jugular node is diminutive and corresponds to tracer affinity at a S.U.V. max of 4.7 on  73/4. Incidental CT finding: No cervical adenopathy. Fluid in both maxillary sinuses. CHEST No thoracic nodal or pulmonary parenchymal tracer affinity. Incidental CT finding: Aortic and coronary artery calcification. Right lung base calcified granulomas. Nonspecific 3 mm left upper lobe pulmonary nodule on 84/4. ABDOMEN/PELVIS Prostate: Diffuse tracer affinity within the prostate at a S.U.V. max of 13.6. Lymph nodes: Index retrocaval node measures 8 mm and a S.U.V. max of 6.0 on 151/4 versus 1.6 cm on 10/05/2021. Right external iliac node measures 6 mm and a S.U.V. max of 2.1 on 188/4. Adenopathy in this region on 10/05/2021 measured 1.4 cm. Left external iliac node measures 5 mm and a S.U.V. max of 2.1 on 193/4 versus 1.0 cm on the prior CT (when remeasured). Liver: No evidence of liver metastasis. Incidental CT finding: Normal adrenal glands. Abdominal aortic atherosclerosis. Degraded evaluation of the pelvis, secondary to beam hardening artifact from bilateral hip arthroplasty. Foley catheter within the urinary bladder. SKELETON Relatively diffuse tracer avid osseous metastasis. Example at L5 measuring a S.U.V. max of 31.6. Diffuse sacral affinity and sclerosis including at a S.U.V. max of 29.6. IMPRESSION: 1. Diffuse osseous metastasis. 2. Diffuse prostatic tracer affinity, likely representing residual or locally recurrent disease. Suboptimally evaluated on CT images secondary to beam hardening artifact from bilateral hip arthroplasty. 3. Abdominopelvic nodal metastasis, improved from 10/05/2021. Isolated low left cervical nodal metastasis. 4. Incidental findings, including: Aortic Atherosclerosis (ICD10-I70.0). Sinus disease. Electronically Signed   By: Abigail Miyamoto M.D.   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 With careful history and exam, we feel all deficits can be localized to cervical region, not lumbar/sacral despite abnormal imaging findings there.    He will visit soon with urology for trial of foley removal.    Also has upcoming appt with Dr. Cari Caraway.  We recommended he continue aggressive PT, and will plan to repeat cervical spine MRI study (with anesthesia) in ~2 months.  Will defer lumbar spine imaging unless symptoms progress or develop that localize there.  Will con't to follow with  Dr. Tasia Catchings for systemic therapy for prostate carcinoma.  We spent twenty additional minutes teaching regarding the natural history, biology, and historical experience in the treatment of neurologic complications of cancer.   We appreciate the opportunity to participate in the care of ELIO HADEN.   All questions were answered. The patient knows to call the clinic with any problems, questions or concerns. No barriers to learning were detected.  The total time spent in the encounter was 40 minutes and more than 50% was on counseling and review of test results   Ventura Sellers, MD Medical Director of Neuro-Oncology [facility] [place] 12/20/21 9:18 AM

## 2021-12-21 ENCOUNTER — Other Ambulatory Visit: Payer: Self-pay | Admitting: Internal Medicine

## 2021-12-22 ENCOUNTER — Other Ambulatory Visit: Payer: Self-pay | Admitting: Internal Medicine

## 2021-12-22 DIAGNOSIS — R002 Palpitations: Secondary | ICD-10-CM

## 2021-12-24 DIAGNOSIS — I959 Hypotension, unspecified: Secondary | ICD-10-CM | POA: Diagnosis not present

## 2021-12-24 DIAGNOSIS — K219 Gastro-esophageal reflux disease without esophagitis: Secondary | ICD-10-CM | POA: Diagnosis not present

## 2021-12-24 DIAGNOSIS — D509 Iron deficiency anemia, unspecified: Secondary | ICD-10-CM | POA: Diagnosis not present

## 2021-12-24 DIAGNOSIS — I10 Essential (primary) hypertension: Secondary | ICD-10-CM | POA: Diagnosis not present

## 2021-12-24 DIAGNOSIS — J9691 Respiratory failure, unspecified with hypoxia: Secondary | ICD-10-CM | POA: Diagnosis not present

## 2021-12-24 DIAGNOSIS — D630 Anemia in neoplastic disease: Secondary | ICD-10-CM | POA: Diagnosis not present

## 2021-12-24 DIAGNOSIS — J9692 Respiratory failure, unspecified with hypercapnia: Secondary | ICD-10-CM | POA: Diagnosis not present

## 2021-12-24 DIAGNOSIS — C7951 Secondary malignant neoplasm of bone: Secondary | ICD-10-CM | POA: Diagnosis not present

## 2021-12-24 DIAGNOSIS — C61 Malignant neoplasm of prostate: Secondary | ICD-10-CM | POA: Diagnosis not present

## 2021-12-24 DIAGNOSIS — M1990 Unspecified osteoarthritis, unspecified site: Secondary | ICD-10-CM | POA: Diagnosis not present

## 2021-12-24 DIAGNOSIS — G8254 Quadriplegia, C5-C7 incomplete: Secondary | ICD-10-CM | POA: Diagnosis not present

## 2021-12-24 DIAGNOSIS — C7940 Secondary malignant neoplasm of unspecified part of nervous system: Secondary | ICD-10-CM | POA: Diagnosis not present

## 2021-12-24 DIAGNOSIS — I200 Unstable angina: Secondary | ICD-10-CM | POA: Diagnosis not present

## 2021-12-24 DIAGNOSIS — E039 Hypothyroidism, unspecified: Secondary | ICD-10-CM | POA: Diagnosis not present

## 2021-12-24 DIAGNOSIS — F419 Anxiety disorder, unspecified: Secondary | ICD-10-CM | POA: Diagnosis not present

## 2021-12-24 DIAGNOSIS — N400 Enlarged prostate without lower urinary tract symptoms: Secondary | ICD-10-CM | POA: Diagnosis not present

## 2021-12-25 ENCOUNTER — Ambulatory Visit: Payer: Self-pay | Admitting: Urology

## 2021-12-25 ENCOUNTER — Encounter: Payer: Self-pay | Admitting: Urology

## 2021-12-25 ENCOUNTER — Ambulatory Visit (INDEPENDENT_AMBULATORY_CARE_PROVIDER_SITE_OTHER): Payer: BC Managed Care – PPO | Admitting: Urology

## 2021-12-25 VITALS — BP 148/74 | HR 77 | Ht 69.0 in | Wt 189.0 lb

## 2021-12-25 DIAGNOSIS — N319 Neuromuscular dysfunction of bladder, unspecified: Secondary | ICD-10-CM | POA: Diagnosis not present

## 2021-12-25 DIAGNOSIS — C61 Malignant neoplasm of prostate: Secondary | ICD-10-CM | POA: Diagnosis not present

## 2021-12-25 NOTE — Progress Notes (Signed)
12/25/21 8:29 AM   Jason [name] June 13, 1955 [id number]  CC: Metastatic prostate cancer, urinary retention  HPI: Complex 66 year old male with prolonged hospitalization and rehab stay.  Briefly, presented with extremity weakness and was found to have PSA >1500, underwent C6 cervical laminectomy and resection of tumor on 10/06/2021 showing metastatic prostate cancer, 8/14 developed significant bilateral upper and lower extremity weakness with hypotension requiring transfer to the ICU, pressors, steroids, and respiratory failure requiring adequate ventilation.  Firmagon loading dose was given on 8/16, and he has continued on ADT.  He also received palliative radiation to the spine.  He is followed by oncology, and also is on enzalutamide(not a candidate for docetaxel).  He has been in urinary retention since that time with Foley catheter felt to be secondary to neurogenic bladder.  He is referred to urology for bladder management options.  He has had chronic Foley changes since that time, most recently 12/09/2020.  He has tolerated the catheter well, no UTIs, pelvic pain, bladder spasms, or leakage around the catheter.  They are concerned about a voiding trial with his limited mobility in the wheelchair.  He does feel like he has had some strength coming back over the last few weeks.  Prior to his admission in August he reported some weak urinary stream and was started on Flomax.   PMH: Past Medical History:  Diagnosis Date   Allergy    Arthritis    osteoarthritis   GERD (gastroesophageal reflux disease)    Hypertension    Hypothyroidism    Renal calculi    Thyroid disease    Hyperthyroidism s/p radioactive iodine ablation    Surgical History: Past Surgical History:  Procedure Laterality Date   CARDIAC CATHETERIZATION  03/2000   HERNIA REPAIR  6195   umbilical   LEFT HEART CATH AND CORONARY ANGIOGRAPHY Left 03/20/2020   Procedure: LEFT HEART CATH AND CORONARY ANGIOGRAPHY;   Surgeon: Nelva Bush, MD;  Location: [place] [facility];  Service: Cardiovascular;  Laterality: Left;   POSTERIOR CERVICAL FUSION/FORAMINOTOMY N/A 10/06/2021   Procedure: POSTERIOR CERVICAL FUSION/ FORAMINOTOMY LEVEL 3;  Surgeon: Meade Maw, MD;  Location: [facility];  Service: Neurosurgery;  Laterality: N/A;  will need monitoring   RADIOLOGY WITH ANESTHESIA Left 04/18/2021   Procedure: MRI SHOULDER WITHOUT CONTRAST WITH ANESTHESIA;  Surgeon: Radiologist, Medication, MD;  Location: [facility];  Service: Radiology;  Laterality: Left;   RADIOLOGY WITH ANESTHESIA N/A 10/03/2021   Procedure: MRI CERVICAL SPINE WITH ANESTHESIA;  Surgeon: Radiologist, Medication, MD;  Location: [facility];  Service: Radiology;  Laterality: N/A;   REVERSE SHOULDER ARTHROPLASTY Left    ROTATOR CUFF REPAIR  11/2010   Dr [name] Heck   TONSILLECTOMY     TOTAL HIP ARTHROPLASTY Right 02/24/2009   TOTAL HIP ARTHROPLASTY Left 06/2016   Dr Harlow Mares    Family History: Family History  Problem Relation Age of Onset   Hypertension Mother    Stroke Mother    Gout Father    Heart attack Father 48   Heart disease Sister    Lung cancer Sister    Drug abuse Sister    Breast cancer Paternal Aunt    Lung cancer Maternal Grandmother    Colon cancer Neg Hx    Esophageal cancer Neg Hx    Rectal cancer Neg Hx    Stomach cancer Neg Hx     Social History:  reports that he has never smoked. He has never used smokeless tobacco. He reports that he does not  currently use alcohol. He reports that he does not use drugs.  Physical Exam: BP (!) 148/74   Pulse 77   Ht '5\' 9"'$  (1.753 m)   Wt 189 lb (85.7 kg)   BMI 27.91 kg/m    Constitutional:  Alert and oriented, No acute distress.  In wheelchair Cardiovascular: No clubbing, cyanosis, or edema. Respiratory: Normal respiratory effort, no increased work of breathing. GI: Abdomen is soft, nontender, nondistended, no abdominal masses Foley with clear yellow urine   Laboratory  Data: Reviewed in epic  Pertinent Imaging: I have personally viewed and interpreted the PSMA PET scan showing small prostate, response to treatment with decreased lymphadenopathy.  Assessment & Plan:   66 year old male with metastatic prostate cancer presenting with PSA >1500, extremity weakness, status post 10/06/2021 C6 laminectomy and resection of tumor and suspected resulting neurogenic bladder requiring Foley catheter.  Currently receiving ADT and enzalutamide through oncology, PSA has decreased to 67.  We discussed the concept of neurogenic bladder at length.  We reviewed options including intermittent catheterization, chronic Foley with monthly changes and voiding trial in the future around 6 months, or urodynamics for more objective data.  They do feel he has been getting stronger over the last few weeks, and would like to wait on voiding trial for at least a few months which I think is reasonable.  Risks and benefits were discussed at length.  Okay to continue Flomax RTC January 2024 to consider voiding trial pending functional improvement  I spent 65 total minutes on the day of the encounter including pre-visit review of the medical record, face-to-face time with the patient, and post visit ordering of labs/imaging/tests.  Nickolas Madrid, MD 12/25/2021  [facility] [address] [place], [place] [address] [phone]

## 2021-12-26 ENCOUNTER — Telehealth: Payer: Self-pay | Admitting: Internal Medicine

## 2021-12-26 DIAGNOSIS — K219 Gastro-esophageal reflux disease without esophagitis: Secondary | ICD-10-CM | POA: Diagnosis not present

## 2021-12-26 DIAGNOSIS — G8254 Quadriplegia, C5-C7 incomplete: Secondary | ICD-10-CM | POA: Diagnosis not present

## 2021-12-26 DIAGNOSIS — C61 Malignant neoplasm of prostate: Secondary | ICD-10-CM | POA: Diagnosis not present

## 2021-12-26 DIAGNOSIS — D630 Anemia in neoplastic disease: Secondary | ICD-10-CM | POA: Diagnosis not present

## 2021-12-26 DIAGNOSIS — I959 Hypotension, unspecified: Secondary | ICD-10-CM | POA: Diagnosis not present

## 2021-12-26 DIAGNOSIS — J9691 Respiratory failure, unspecified with hypoxia: Secondary | ICD-10-CM | POA: Diagnosis not present

## 2021-12-26 DIAGNOSIS — C7951 Secondary malignant neoplasm of bone: Secondary | ICD-10-CM | POA: Diagnosis not present

## 2021-12-26 DIAGNOSIS — E039 Hypothyroidism, unspecified: Secondary | ICD-10-CM | POA: Diagnosis not present

## 2021-12-26 DIAGNOSIS — I10 Essential (primary) hypertension: Secondary | ICD-10-CM | POA: Diagnosis not present

## 2021-12-26 DIAGNOSIS — J9692 Respiratory failure, unspecified with hypercapnia: Secondary | ICD-10-CM | POA: Diagnosis not present

## 2021-12-26 DIAGNOSIS — M1990 Unspecified osteoarthritis, unspecified site: Secondary | ICD-10-CM | POA: Diagnosis not present

## 2021-12-26 DIAGNOSIS — N400 Enlarged prostate without lower urinary tract symptoms: Secondary | ICD-10-CM | POA: Diagnosis not present

## 2021-12-26 DIAGNOSIS — D509 Iron deficiency anemia, unspecified: Secondary | ICD-10-CM | POA: Diagnosis not present

## 2021-12-26 DIAGNOSIS — F419 Anxiety disorder, unspecified: Secondary | ICD-10-CM | POA: Diagnosis not present

## 2021-12-26 DIAGNOSIS — C7940 Secondary malignant neoplasm of unspecified part of nervous system: Secondary | ICD-10-CM | POA: Diagnosis not present

## 2021-12-26 DIAGNOSIS — I200 Unstable angina: Secondary | ICD-10-CM | POA: Diagnosis not present

## 2021-12-26 NOTE — Telephone Encounter (Signed)
Home Health verbal orders Caller Name: [place] Name: [place] number: [phone], no ext, secured   Requesting OT/PT/Skilled nursing/Social Work/Speech: home aid   Reason:  Frequency: two times a week for 4 weeks   Please forward to [facility] pool or providers CMA

## 2021-12-27 ENCOUNTER — Encounter: Payer: Self-pay | Admitting: Internal Medicine

## 2021-12-27 NOTE — Telephone Encounter (Signed)
Spoke to [place] and relayed verbal orders for Home Aid 2x a week x 4 weeks, per Dr. Silvio Pate.

## 2021-12-31 ENCOUNTER — Ambulatory Visit (INDEPENDENT_AMBULATORY_CARE_PROVIDER_SITE_OTHER): Payer: BC Managed Care – PPO | Admitting: Neurosurgery

## 2021-12-31 ENCOUNTER — Encounter: Payer: Self-pay | Admitting: Neurosurgery

## 2021-12-31 VITALS — BP 128/82

## 2021-12-31 DIAGNOSIS — I10 Essential (primary) hypertension: Secondary | ICD-10-CM | POA: Diagnosis not present

## 2021-12-31 DIAGNOSIS — J9692 Respiratory failure, unspecified with hypercapnia: Secondary | ICD-10-CM | POA: Diagnosis not present

## 2021-12-31 DIAGNOSIS — C7951 Secondary malignant neoplasm of bone: Secondary | ICD-10-CM

## 2021-12-31 DIAGNOSIS — E039 Hypothyroidism, unspecified: Secondary | ICD-10-CM | POA: Diagnosis not present

## 2021-12-31 DIAGNOSIS — C61 Malignant neoplasm of prostate: Secondary | ICD-10-CM | POA: Diagnosis not present

## 2021-12-31 DIAGNOSIS — I959 Hypotension, unspecified: Secondary | ICD-10-CM | POA: Diagnosis not present

## 2021-12-31 DIAGNOSIS — M1990 Unspecified osteoarthritis, unspecified site: Secondary | ICD-10-CM | POA: Diagnosis not present

## 2021-12-31 DIAGNOSIS — G8254 Quadriplegia, C5-C7 incomplete: Secondary | ICD-10-CM | POA: Diagnosis not present

## 2021-12-31 DIAGNOSIS — D509 Iron deficiency anemia, unspecified: Secondary | ICD-10-CM | POA: Diagnosis not present

## 2021-12-31 DIAGNOSIS — Z08 Encounter for follow-up examination after completed treatment for malignant neoplasm: Secondary | ICD-10-CM

## 2021-12-31 DIAGNOSIS — F419 Anxiety disorder, unspecified: Secondary | ICD-10-CM | POA: Diagnosis not present

## 2021-12-31 DIAGNOSIS — I200 Unstable angina: Secondary | ICD-10-CM | POA: Diagnosis not present

## 2021-12-31 DIAGNOSIS — C7940 Secondary malignant neoplasm of unspecified part of nervous system: Secondary | ICD-10-CM | POA: Diagnosis not present

## 2021-12-31 DIAGNOSIS — D630 Anemia in neoplastic disease: Secondary | ICD-10-CM | POA: Diagnosis not present

## 2021-12-31 DIAGNOSIS — K219 Gastro-esophageal reflux disease without esophagitis: Secondary | ICD-10-CM | POA: Diagnosis not present

## 2021-12-31 DIAGNOSIS — J9691 Respiratory failure, unspecified with hypoxia: Secondary | ICD-10-CM | POA: Diagnosis not present

## 2021-12-31 DIAGNOSIS — N400 Enlarged prostate without lower urinary tract symptoms: Secondary | ICD-10-CM | POA: Diagnosis not present

## 2021-12-31 NOTE — Progress Notes (Signed)
   DOS: 10/06/21 (C6 lami for tumor)  HISTORY OF PRESENT ILLNESS: 12/31/2021 [name] presents to see me.  He has had slow continued improvement in his symptoms.  11/26/2021 [name] is status post the above surgery.  He was discharged to rehab and has made significant progress.  He is still wheelchair-bound but can now move his legs.  His right arm is very functional.  He is able to feed himself at this point.  PHYSICAL EXAMINATION:   Vitals:   12/31/21 1548  BP: 128/82    General: Patient is well developed, well nourished, calm, collected, and in no apparent distress.  NEUROLOGICAL:  General: In no acute distress.  Awake, alert, oriented to person, place, and time. Pupils equal round and reactive to light.   Strength: Side Biceps Triceps Deltoid Interossei Grip Wrist Ext. Wrist Flex.  R 5 4+ '5 5 5 5 5  '$ L '4 4 2 4 4 4 4   '$ He is able to wiggle toes.  He has 3 out of 5 knee extension.  He has 3 out of 5 dorsiflexion.  Incision c/d/i   ROS (Neurologic): Negative except as noted above  IMAGING: No interval imaging to review   ASSESSMENT/PLAN:  Jason [name] is doing better after C6 laminectomy for prostate cancer tumor resection.  He had significant neurologic decline and has subsequently had substantial improvement.  I we will continue to show some improvement.  Hopefully he will improve to the point of being ambulatory.  He will start chemotherapy soon.  I will continue to monitor his imaging studies.   Meade Maw MD, [facility] Department of Neurosurgery

## 2021-12-31 NOTE — Telephone Encounter (Signed)
Colletta [place] from [place] called in and stated that these verbal orders are no longer needed. Patients insurance will not pay for them.

## 2022-01-02 ENCOUNTER — Telehealth: Payer: Self-pay | Admitting: Internal Medicine

## 2022-01-02 DIAGNOSIS — I10 Essential (primary) hypertension: Secondary | ICD-10-CM | POA: Diagnosis not present

## 2022-01-02 DIAGNOSIS — C7951 Secondary malignant neoplasm of bone: Secondary | ICD-10-CM | POA: Diagnosis not present

## 2022-01-02 DIAGNOSIS — E039 Hypothyroidism, unspecified: Secondary | ICD-10-CM | POA: Diagnosis not present

## 2022-01-02 DIAGNOSIS — C61 Malignant neoplasm of prostate: Secondary | ICD-10-CM | POA: Diagnosis not present

## 2022-01-02 DIAGNOSIS — C7940 Secondary malignant neoplasm of unspecified part of nervous system: Secondary | ICD-10-CM | POA: Diagnosis not present

## 2022-01-02 DIAGNOSIS — I959 Hypotension, unspecified: Secondary | ICD-10-CM | POA: Diagnosis not present

## 2022-01-02 DIAGNOSIS — M1990 Unspecified osteoarthritis, unspecified site: Secondary | ICD-10-CM | POA: Diagnosis not present

## 2022-01-02 DIAGNOSIS — J9692 Respiratory failure, unspecified with hypercapnia: Secondary | ICD-10-CM | POA: Diagnosis not present

## 2022-01-02 DIAGNOSIS — K219 Gastro-esophageal reflux disease without esophagitis: Secondary | ICD-10-CM | POA: Diagnosis not present

## 2022-01-02 DIAGNOSIS — D509 Iron deficiency anemia, unspecified: Secondary | ICD-10-CM | POA: Diagnosis not present

## 2022-01-02 DIAGNOSIS — D630 Anemia in neoplastic disease: Secondary | ICD-10-CM | POA: Diagnosis not present

## 2022-01-02 DIAGNOSIS — I200 Unstable angina: Secondary | ICD-10-CM | POA: Diagnosis not present

## 2022-01-02 DIAGNOSIS — N400 Enlarged prostate without lower urinary tract symptoms: Secondary | ICD-10-CM | POA: Diagnosis not present

## 2022-01-02 DIAGNOSIS — G8254 Quadriplegia, C5-C7 incomplete: Secondary | ICD-10-CM | POA: Diagnosis not present

## 2022-01-02 DIAGNOSIS — F419 Anxiety disorder, unspecified: Secondary | ICD-10-CM | POA: Diagnosis not present

## 2022-01-02 DIAGNOSIS — J9691 Respiratory failure, unspecified with hypoxia: Secondary | ICD-10-CM | POA: Diagnosis not present

## 2022-01-02 NOTE — Telephone Encounter (Signed)
Home Health verbal orders [place] Agency Name: [facility] number: [phone]  Requesting OT/PT/Skilled nursing/Social Work/Speech:OT  Reason:  Frequency:1 week for every other week for two visits  Please forward to [facility] pool or providers CMA

## 2022-01-03 NOTE — Telephone Encounter (Signed)
Spoke to [place] with verbal orders

## 2022-01-06 ENCOUNTER — Encounter: Payer: Self-pay | Admitting: Oncology

## 2022-01-08 ENCOUNTER — Ambulatory Visit
Admission: RE | Admit: 2022-01-08 | Discharge: 2022-01-08 | Disposition: A | Discharge status: Home / Self Care | Payer: BC Managed Care – PPO | Source: Ambulatory Visit | Attending: Oncology | Admitting: Oncology

## 2022-01-08 DIAGNOSIS — F419 Anxiety disorder, unspecified: Secondary | ICD-10-CM | POA: Diagnosis not present

## 2022-01-08 DIAGNOSIS — G8254 Quadriplegia, C5-C7 incomplete: Secondary | ICD-10-CM | POA: Diagnosis not present

## 2022-01-08 DIAGNOSIS — D630 Anemia in neoplastic disease: Secondary | ICD-10-CM | POA: Diagnosis not present

## 2022-01-08 DIAGNOSIS — E039 Hypothyroidism, unspecified: Secondary | ICD-10-CM | POA: Diagnosis not present

## 2022-01-08 DIAGNOSIS — J9691 Respiratory failure, unspecified with hypoxia: Secondary | ICD-10-CM | POA: Diagnosis not present

## 2022-01-08 DIAGNOSIS — I10 Essential (primary) hypertension: Secondary | ICD-10-CM | POA: Diagnosis not present

## 2022-01-08 DIAGNOSIS — M1990 Unspecified osteoarthritis, unspecified site: Secondary | ICD-10-CM | POA: Diagnosis not present

## 2022-01-08 DIAGNOSIS — D509 Iron deficiency anemia, unspecified: Secondary | ICD-10-CM | POA: Diagnosis not present

## 2022-01-08 DIAGNOSIS — Z86718 Personal history of other venous thrombosis and embolism: Secondary | ICD-10-CM | POA: Diagnosis not present

## 2022-01-08 DIAGNOSIS — C61 Malignant neoplasm of prostate: Secondary | ICD-10-CM | POA: Diagnosis not present

## 2022-01-08 DIAGNOSIS — C7940 Secondary malignant neoplasm of unspecified part of nervous system: Secondary | ICD-10-CM | POA: Diagnosis not present

## 2022-01-08 DIAGNOSIS — N400 Enlarged prostate without lower urinary tract symptoms: Secondary | ICD-10-CM | POA: Diagnosis not present

## 2022-01-08 DIAGNOSIS — I959 Hypotension, unspecified: Secondary | ICD-10-CM | POA: Diagnosis not present

## 2022-01-08 DIAGNOSIS — I82492 Acute embolism and thrombosis of other specified deep vein of left lower extremity: Secondary | ICD-10-CM | POA: Insufficient documentation

## 2022-01-08 DIAGNOSIS — J9692 Respiratory failure, unspecified with hypercapnia: Secondary | ICD-10-CM | POA: Diagnosis not present

## 2022-01-08 DIAGNOSIS — I200 Unstable angina: Secondary | ICD-10-CM | POA: Diagnosis not present

## 2022-01-08 DIAGNOSIS — K219 Gastro-esophageal reflux disease without esophagitis: Secondary | ICD-10-CM | POA: Diagnosis not present

## 2022-01-08 DIAGNOSIS — C7951 Secondary malignant neoplasm of bone: Secondary | ICD-10-CM | POA: Diagnosis not present

## 2022-01-09 ENCOUNTER — Encounter: Payer: Self-pay | Admitting: Internal Medicine

## 2022-01-09 DIAGNOSIS — J9691 Respiratory failure, unspecified with hypoxia: Secondary | ICD-10-CM | POA: Diagnosis not present

## 2022-01-09 DIAGNOSIS — C61 Malignant neoplasm of prostate: Secondary | ICD-10-CM | POA: Diagnosis not present

## 2022-01-09 DIAGNOSIS — I200 Unstable angina: Secondary | ICD-10-CM | POA: Diagnosis not present

## 2022-01-09 DIAGNOSIS — E039 Hypothyroidism, unspecified: Secondary | ICD-10-CM | POA: Diagnosis not present

## 2022-01-09 DIAGNOSIS — D509 Iron deficiency anemia, unspecified: Secondary | ICD-10-CM | POA: Diagnosis not present

## 2022-01-09 DIAGNOSIS — K219 Gastro-esophageal reflux disease without esophagitis: Secondary | ICD-10-CM | POA: Diagnosis not present

## 2022-01-09 DIAGNOSIS — I959 Hypotension, unspecified: Secondary | ICD-10-CM | POA: Diagnosis not present

## 2022-01-09 DIAGNOSIS — I10 Essential (primary) hypertension: Secondary | ICD-10-CM | POA: Diagnosis not present

## 2022-01-09 DIAGNOSIS — J9692 Respiratory failure, unspecified with hypercapnia: Secondary | ICD-10-CM | POA: Diagnosis not present

## 2022-01-09 DIAGNOSIS — N400 Enlarged prostate without lower urinary tract symptoms: Secondary | ICD-10-CM | POA: Diagnosis not present

## 2022-01-09 DIAGNOSIS — C7940 Secondary malignant neoplasm of unspecified part of nervous system: Secondary | ICD-10-CM | POA: Diagnosis not present

## 2022-01-09 DIAGNOSIS — F419 Anxiety disorder, unspecified: Secondary | ICD-10-CM | POA: Diagnosis not present

## 2022-01-09 DIAGNOSIS — D630 Anemia in neoplastic disease: Secondary | ICD-10-CM | POA: Diagnosis not present

## 2022-01-09 DIAGNOSIS — M1990 Unspecified osteoarthritis, unspecified site: Secondary | ICD-10-CM | POA: Diagnosis not present

## 2022-01-09 DIAGNOSIS — G8254 Quadriplegia, C5-C7 incomplete: Secondary | ICD-10-CM | POA: Diagnosis not present

## 2022-01-09 DIAGNOSIS — C7951 Secondary malignant neoplasm of bone: Secondary | ICD-10-CM | POA: Diagnosis not present

## 2022-01-10 ENCOUNTER — Other Ambulatory Visit: Payer: Self-pay

## 2022-01-10 DIAGNOSIS — C61 Malignant neoplasm of prostate: Secondary | ICD-10-CM

## 2022-01-13 ENCOUNTER — Encounter: Payer: Self-pay | Admitting: Oncology

## 2022-01-13 ENCOUNTER — Inpatient Hospital Stay: Payer: BC Managed Care – PPO | Attending: Anatomic Pathology & Clinical Pathology

## 2022-01-13 ENCOUNTER — Inpatient Hospital Stay (HOSPITAL_BASED_OUTPATIENT_CLINIC_OR_DEPARTMENT_OTHER): Payer: BC Managed Care – PPO | Admitting: Oncology

## 2022-01-13 ENCOUNTER — Inpatient Hospital Stay: Payer: BC Managed Care – PPO | Admitting: Pharmacist

## 2022-01-13 VITALS — BP 153/76 | HR 64 | Temp 98.2°F

## 2022-01-13 DIAGNOSIS — Z8349 Family history of other endocrine, nutritional and metabolic diseases: Secondary | ICD-10-CM | POA: Insufficient documentation

## 2022-01-13 DIAGNOSIS — Z79890 Hormone replacement therapy (postmenopausal): Secondary | ICD-10-CM | POA: Insufficient documentation

## 2022-01-13 DIAGNOSIS — F419 Anxiety disorder, unspecified: Secondary | ICD-10-CM | POA: Diagnosis not present

## 2022-01-13 DIAGNOSIS — G8250 Quadriplegia, unspecified: Secondary | ICD-10-CM | POA: Diagnosis not present

## 2022-01-13 DIAGNOSIS — Z803 Family history of malignant neoplasm of breast: Secondary | ICD-10-CM | POA: Diagnosis not present

## 2022-01-13 DIAGNOSIS — M47816 Spondylosis without myelopathy or radiculopathy, lumbar region: Secondary | ICD-10-CM | POA: Diagnosis not present

## 2022-01-13 DIAGNOSIS — C7940 Secondary malignant neoplasm of unspecified part of nervous system: Secondary | ICD-10-CM | POA: Diagnosis not present

## 2022-01-13 DIAGNOSIS — I10 Essential (primary) hypertension: Secondary | ICD-10-CM | POA: Diagnosis not present

## 2022-01-13 DIAGNOSIS — I200 Unstable angina: Secondary | ICD-10-CM | POA: Diagnosis not present

## 2022-01-13 DIAGNOSIS — Z801 Family history of malignant neoplasm of trachea, bronchus and lung: Secondary | ICD-10-CM | POA: Insufficient documentation

## 2022-01-13 DIAGNOSIS — Z79899 Other long term (current) drug therapy: Secondary | ICD-10-CM | POA: Diagnosis not present

## 2022-01-13 DIAGNOSIS — IMO0001 Reserved for inherently not codable concepts without codable children: Secondary | ICD-10-CM | POA: Insufficient documentation

## 2022-01-13 DIAGNOSIS — M48061 Spinal stenosis, lumbar region without neurogenic claudication: Secondary | ICD-10-CM | POA: Insufficient documentation

## 2022-01-13 DIAGNOSIS — K219 Gastro-esophageal reflux disease without esophagitis: Secondary | ICD-10-CM | POA: Diagnosis not present

## 2022-01-13 DIAGNOSIS — C61 Malignant neoplasm of prostate: Secondary | ICD-10-CM | POA: Diagnosis not present

## 2022-01-13 DIAGNOSIS — N319 Neuromuscular dysfunction of bladder, unspecified: Secondary | ICD-10-CM | POA: Insufficient documentation

## 2022-01-13 DIAGNOSIS — Z7901 Long term (current) use of anticoagulants: Secondary | ICD-10-CM | POA: Diagnosis not present

## 2022-01-13 DIAGNOSIS — Z814 Family history of other substance abuse and dependence: Secondary | ICD-10-CM | POA: Insufficient documentation

## 2022-01-13 DIAGNOSIS — Z79818 Long term (current) use of other agents affecting estrogen receptors and estrogen levels: Secondary | ICD-10-CM | POA: Diagnosis not present

## 2022-01-13 DIAGNOSIS — Z823 Family history of stroke: Secondary | ICD-10-CM | POA: Insufficient documentation

## 2022-01-13 DIAGNOSIS — E039 Hypothyroidism, unspecified: Secondary | ICD-10-CM | POA: Diagnosis not present

## 2022-01-13 DIAGNOSIS — J9691 Respiratory failure, unspecified with hypoxia: Secondary | ICD-10-CM | POA: Diagnosis not present

## 2022-01-13 DIAGNOSIS — M1990 Unspecified osteoarthritis, unspecified site: Secondary | ICD-10-CM | POA: Diagnosis not present

## 2022-01-13 DIAGNOSIS — M4802 Spinal stenosis, cervical region: Secondary | ICD-10-CM | POA: Diagnosis not present

## 2022-01-13 DIAGNOSIS — N400 Enlarged prostate without lower urinary tract symptoms: Secondary | ICD-10-CM | POA: Diagnosis not present

## 2022-01-13 DIAGNOSIS — I959 Hypotension, unspecified: Secondary | ICD-10-CM | POA: Diagnosis not present

## 2022-01-13 DIAGNOSIS — G8254 Quadriplegia, C5-C7 incomplete: Secondary | ICD-10-CM | POA: Diagnosis not present

## 2022-01-13 DIAGNOSIS — C7951 Secondary malignant neoplasm of bone: Secondary | ICD-10-CM | POA: Diagnosis not present

## 2022-01-13 DIAGNOSIS — Z86718 Personal history of other venous thrombosis and embolism: Secondary | ICD-10-CM | POA: Insufficient documentation

## 2022-01-13 DIAGNOSIS — I700 Atherosclerosis of aorta: Secondary | ICD-10-CM | POA: Insufficient documentation

## 2022-01-13 DIAGNOSIS — Z8249 Family history of ischemic heart disease and other diseases of the circulatory system: Secondary | ICD-10-CM | POA: Diagnosis not present

## 2022-01-13 DIAGNOSIS — J9692 Respiratory failure, unspecified with hypercapnia: Secondary | ICD-10-CM | POA: Diagnosis not present

## 2022-01-13 DIAGNOSIS — D630 Anemia in neoplastic disease: Secondary | ICD-10-CM | POA: Diagnosis not present

## 2022-01-13 DIAGNOSIS — D509 Iron deficiency anemia, unspecified: Secondary | ICD-10-CM | POA: Diagnosis not present

## 2022-01-13 LAB — COMPREHENSIVE METABOLIC PANEL
ALT: 20 U/L (ref 0–44)
AST: 26 U/L (ref 15–41)
Albumin: 3.6 g/dL (ref 3.5–5.0)
Alkaline Phosphatase: 172 U/L — ABNORMAL HIGH (ref 38–126)
Anion gap: 6 (ref 5–15)
BUN: 10 mg/dL (ref 8–23)
CO2: 29 mmol/L (ref 22–32)
Calcium: 8.8 mg/dL — ABNORMAL LOW (ref 8.9–10.3)
Chloride: 102 mmol/L (ref 98–111)
Creatinine, Ser: 0.43 mg/dL — ABNORMAL LOW (ref 0.61–1.24)
GFR, Estimated: >60 mL/min (ref >60)
Glucose, Bld: 86 mg/dL (ref 70–99)
Potassium: 3.4 mmol/L — ABNORMAL LOW (ref 3.5–5.1)
Sodium: 137 mmol/L (ref 135–145)
Total Bilirubin: 0.8 mg/dL (ref 0.3–1.2)
Total Protein: 7.1 g/dL (ref 6.5–8.1)

## 2022-01-13 LAB — CBC WITH DIFFERENTIAL/PLATELET
Abs Immature Granulocytes: 0.01 10*3/uL (ref 0.00–0.07)
Basophils Absolute: 0.1 10*3/uL (ref 0.0–0.1)
Basophils Relative: 1 %
Eosinophils Absolute: 0.1 10*3/uL (ref 0.0–0.5)
Eosinophils Relative: 3 %
HCT: 43.5 % (ref 39.0–52.0)
Hemoglobin: 14 g/dL (ref 13.0–17.0)
Immature Granulocytes: 0 %
Lymphocytes Relative: 14 %
Lymphs Abs: 0.7 10*3/uL (ref 0.7–4.0)
MCH: 29.8 pg (ref 26.0–34.0)
MCHC: 32.2 g/dL (ref 30.0–36.0)
MCV: 92.6 fL (ref 80.0–100.0)
Monocytes Absolute: 0.3 10*3/uL (ref 0.1–1.0)
Monocytes Relative: 6 %
Neutro Abs: 3.6 10*3/uL (ref 1.7–7.7)
Neutrophils Relative %: 76 %
Platelets: 244 10*3/uL (ref 150–400)
RBC: 4.7 MIL/uL (ref 4.22–5.81)
RDW: 14.2 % (ref 11.5–15.5)
WBC: 4.7 10*3/uL (ref 4.0–10.5)
nRBC: 0 % (ref 0.0–0.2)

## 2022-01-13 LAB — PSA: Prostatic Specific Antigen: 43.95 ng/mL — ABNORMAL HIGH (ref 0.00–4.00)

## 2022-01-13 MED ORDER — ENZALUTAMIDE 40 MG PO TABS: 160.0000 mg | ORAL_TABLET | Freq: Every day | ORAL | 1 refills | Status: DC

## 2022-01-13 NOTE — Assessment & Plan Note (Signed)
Patient has an appointment with neurology Dr. Mickeal Skinner in December. Continue physical therapy.

## 2022-01-13 NOTE — Assessment & Plan Note (Addendum)
Per Dr.Lovorn Megan's note, patient developed left gastrocnemius vein DVT 8/26.  Provoked by immobilization. Repeat ultrasound showed no blood clots. Discussed with patient that he may develop recurrent DVT due to immobility secondary to paralysis.  We reviewed about red flag symptoms.  Eliquis interacts with Xtandi.  I recommend patient to stop Eliquis. Recommend patient to increase aspirin to 162 mg daily for prophylaxis.

## 2022-01-13 NOTE — Assessment & Plan Note (Signed)
Metastatic castration sensitive prostate cancer, with CNS involvement. Currently on chemoprevention therapy with Eligard.  Status post palliative radiation to spine. PMSA PET scan showed partial response. Patient is not a candidate for chemotherapy with docetaxel. Recommend Xtandi 160 mg daily. Rationale and side effects were reviewed and discussed with patient.  Patient also met oral chemotherapy pharmacist Alyson today as well.

## 2022-01-13 NOTE — Assessment & Plan Note (Addendum)
Recommend Eligard 45 mg every 6 months.-Patient has injection appointment scheduled on 02/07/2022.

## 2022-01-13 NOTE — Progress Notes (Addendum)
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 Currently on chemoprevention therapy with Eligard.  Status post palliative radiation to spine. PMSA PET scan showed partial response. Patient is not a candidate for chemotherapy with docetaxel. Recommend Xtandi 160 mg daily. Rationale and side effects were reviewed and discussed with patient.  Patient also met oral chemotherapy pharmacist Alyson today as well.  History of deep vein thrombosis (DVT) of lower extremity Per Dr.Lovorn Megan's note, patient developed left gastrocnemius vein DVT 8/26.  Provoked by immobilization. Repeat ultrasound showed no blood clots. Discussed with patient that he may develop recurrent DVT due to immobility secondary to paralysis.  We reviewed about red flag symptoms.  Eliquis interacts with Xtandi.  I recommend patient to stop Eliquis. Recommend patient to increase aspirin to 162 mg daily for prophylaxis.  Androgen deprivation therapy Recommend Eligard 45 mg every 6 months.-Patient has injection appointment scheduled on 02/07/2022.  Chronic incomplete quadriplegia [facility]) Patient has an appointment with neurology Dr. Mickeal Skinner in December. Continue physical therapy.  Orders Placed This Encounter  Procedures   Comprehensive metabolic panel    Standing Status:   Future    Standing Expiration Date:   01/14/2023   CBC with Differential/Platelet    Standing Status:   Future    Standing Expiration Date:   01/14/2023   Follow up TBD, plan to see patient 2 to 3 weeks after started on Xtandi.  All  questions were answered. The patient knows to call the clinic with any problems, questions or concerns.  Earlie Server, MD, PhD [facility] Hematology Oncology 01/13/2022      CHIEF COMPLAINTS/PURPOSE OF CONSULTATION:  Metastatic prostate cancer   HISTORY OF PRESENTING ILLNESS:  Jason [name] 66 y.o. male presents to establish care for metastatic prostate cancer I have reviewed his chart and materials related to his cancer extensively and collaborated history with the patient. 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  Associated epidural disease greater at the C6 where there is marked canal stenosis without cord compression.  Left greater than right foraminal effacement due to the extraosseous disease.   10/03/2021 Imaging   MRI left shoulder showed High-riding humeral head with massive full-thickness tear of the entire supraspinatus tendon and the anterior 50% of the infraspinatus tendon. Additional partial-thickness articular sided tearing of the posterior infraspinatus. Moderate to high-grade supraspinatus and infraspinatus muscle atrophy suggests these tears  are chronic. Partial-thickness tearing of the superior greater than inferior  aspects of the subscapularis tendon footprint. Mild subscapularis muscle atrophy.  Moderate degenerative changes of the acromioclavicular joint. Moderate glenohumeral cartilage degenerative changes.   10/05/2021 Imaging   CT chest abdomen pelvis with contrast showed  Moderate severity retroperitoneal and pelvic lymphadenopathy,consistent with metastatic disease. Small lytic areas at the levels of L4 and S1, with diffusely sclerotic changes involving the second and fourth right ribs, sacrum and left iliac bone. These  findings are likely consistent with osseous metastasis.  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  Sigmoid diverticulosis. Aortic atherosclerosis   10/05/2021 Imaging   CT cervical spine without contrast Extensive heterogeneous sclerotic lesions throughout the cervical spine compatible with metastatic disease to the bone.  Mixed sclerotic and lytic changes in the posterior elements at C5 and C6.Extraosseous tumor at C5 and C6 is better appreciated on the MRI scan.   10/05/2021 Tumor Marker   PSA >1500   10/06/2021 Cancer Staging   Staging form: Prostate, AJCC 8th Edition - Clinical stage from 10/06/2021: Stage IVB (cN1, pM1c, PSA: 1500) - Signed by Earlie Server, MD on 12/04/2021 Stage prefix: Initial diagnosis Prostate specific antigen (PSA) range: 20 or greater   10/08/2021 Imaging   MRI thoracic spine and the lumbar spine with and without contrast 1. 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 2. 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 3. Additional epidural tumor along the posterior endplates at J0,K93, and T12 without high-grade spinal canal stenosis or cord compression. 4. Extensive epidural tumor in the lumbar spine is greatest in thickness at L1 without high-grade spinal canal stenosis at this level; however, superimposed on pre-existing degenerative changes lower in the lumbar spine results in severe spinal canal stenosis with cauda equina nerve root compression at L3-L4 and L4-L5. Multilevel neural foraminal stenosis is detailed above. 5. Postsurgical changes reflecting C6 laminectomy unchanged spinal canal stenosis compared to the postoperative study from 1 day  prior.No definite cord signal abnormality. 6. Extramedullary fluid collection along the dorsal aspect of the cord beginning at T9-T10 extending into the lumbar spine is likely subdural in location but is of uncertain etiology; evolving blood products not excluded. The collection anteriorly displaces the cord and cauda equina nerve roots without frank cord compression or signal abnormality. There is no peripheral enhancement to suggest abscess. 7. Prevertebral edema in the cervical spine which may be postsurgical in nature. 8. New bilateral lower lobe consolidations may reflect atelectasis or developing pneumonia/aspiration.   10/08/2021 Imaging   MRI brain with and without contrast showed 1. 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 There is intracranial extension of tumor along the right frontal convexity measuring up to 5 mm in thickness without mass effect on the underlying brain parenchyma or midline shift. Extracranial extension of tumor in this location measures up to approximately 3 mm. 2. Additional extracranial extension of tumor into the scalp along the vertex measuring up to 7 mm in thickness. 3. Small focus of diffusion restriction in the right parietal cortex most in keeping with a small acute infarct. 4. No evidence of parenchymal metastatic disease.   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 10/06/2021, patient underwent C6 cervical laminectomy and resection of tumor.  Pathology came back positive for metastatic prostate cancer. 8/14 developed significant bilateral upper and lower extremity weakness with hypotension requiring transfer to the ICU, pressors, steroid, respiratory failure on mechanical ventilation. Later patient was stabilized and extubated.  8/16 Firmagon loading dose 229m  9/15 Eligard 22.533mgiven inpatient  during his rehab admission.     10/2021 -  Radiation Therapy   Palliative radiation to spine.    12/04/2021 Tumor  Marker   PSA 67.09    INTERVAL HISTORY Jason [name] is a 66 y.o. male who has above history reviewed by me today presents for follow up visit for metastatic prostate cancer. Patient was accompanied by wife.  Patient has neurogenic bladder and has indwelling Foley catheter. Patient has chronic bilateral lower extremity extremity weakness, generalized left upper extremity weakness.  He gets physical therapy.. Patient is on Eliquis 5 mg twice daily.  Patient tolerates well with no acute bleeding events.   MEDICAL HISTORY:  Past Medical History:  Diagnosis Date   Allergy    Arthritis    osteoarthritis   GERD (gastroesophageal reflux disease)    Hypertension    Hypothyroidism    Renal calculi    Thyroid disease    Hyperthyroidism s/p radioactive iodine ablation    SURGICAL HISTORY: Past Surgical History:  Procedure Laterality Date   CARDIAC CATHETERIZATION  03/2000   HERNIA REPAIR  1607   umbilical   LEFT HEART CATH AND CORONARY ANGIOGRAPHY Left 03/20/2020   Procedure: LEFT HEART CATH AND CORONARY ANGIOGRAPHY;  Surgeon: Nelva Bush, MD;  Location: [place] [facility];  Service: Cardiovascular;  Laterality: Left;   POSTERIOR CERVICAL FUSION/FORAMINOTOMY N/A 10/06/2021   Procedure: POSTERIOR CERVICAL FUSION/ FORAMINOTOMY LEVEL 3;  Surgeon: Meade Maw, MD;  Location: [facility];  Service: Neurosurgery;  Laterality: N/A;  will need monitoring   RADIOLOGY WITH ANESTHESIA Left 04/18/2021   Procedure: MRI SHOULDER WITHOUT CONTRAST WITH ANESTHESIA;  Surgeon: Radiologist, Medication, MD;  Location: [facility];  Service: Radiology;  Laterality: Left;   RADIOLOGY WITH ANESTHESIA N/A 10/03/2021   Procedure: MRI CERVICAL SPINE WITH ANESTHESIA;  Surgeon: Radiologist, Medication, MD;  Location: [place];  Service: Radiology;  Laterality: N/A;   REVERSE SHOULDER ARTHROPLASTY  Left    ROTATOR CUFF REPAIR  11/2010   Dr [name] Heck   TONSILLECTOMY     TOTAL HIP ARTHROPLASTY Right 02/24/2009   TOTAL HIP ARTHROPLASTY Left 06/2016   Dr Harlow Mares    SOCIAL HISTORY: Social History   Socioeconomic History   Marital status: Married    Spouse name: [name]    Number of children: 0   Years of education: Not on file   Highest education level: Not on file  Occupational History   Occupation: Therapist, occupational: [employer]  Tobacco Use   Smoking status: Never    Passive exposure: Never   Smokeless tobacco: Never  Vaping Use   Vaping Use: Never used  Substance and Sexual Activity   Alcohol use: Not Currently    Comment: Alcohol once every few weeks/months   Drug use: No   Sexual activity: Yes  Other Topics Concern   Not on file  Social History Narrative   Lives at home with spouse.    Social Determinants of Health   Financial Resource Strain: Not on file  Food Insecurity: Not on file  Transportation Needs: Not on file  Physical Activity: Not on file  Stress: Not on file  Social Connections: Not on file  Intimate Partner Violence: Not on file    FAMILY HISTORY: Family History  Problem Relation Age of Onset   Hypertension Mother    Stroke Mother    Gout Father    Heart attack Father 73   Heart disease Sister    Lung cancer Sister    Drug abuse Sister    Breast cancer Paternal Aunt    Lung cancer Maternal Grandmother    Colon cancer Neg Hx  Esophageal cancer Neg Hx    Rectal cancer Neg Hx    Stomach cancer Neg Hx     ALLERGIES:  has No Known Allergies.  MEDICATIONS:  Current Outpatient Medications  Medication Sig Dispense Refill   acetaminophen (TYLENOL) 325 MG tablet Take 1-2 tablets (325-650 mg total) by mouth every 4 (four) hours as needed for mild pain.     ascorbic acid (VITAMIN C) 500 MG tablet Take 1 tablet (500 mg total) by mouth daily. 30 tablet 0   aspirin EC 81 MG tablet Take 1 tablet (81 mg total) by mouth daily. Swallow  whole. 30 tablet 12   atorvastatin (LIPITOR) 40 MG tablet Take 1 tablet (40 mg total) by mouth daily. 90 tablet 3   bisacodyl (DULCOLAX) 10 MG suppository Place 1 suppository (10 mg total) rectally daily as needed for moderate constipation. 12 suppository 0   cetirizine (ZYRTEC) 10 MG tablet Take 10 mg by mouth at bedtime.     clonazePAM (KLONOPIN) 0.5 MG tablet Take 1 tablet (0.5 mg total) by mouth 2 (two) times daily. 60 tablet 0   docusate sodium (COLACE) 100 MG capsule Take 2 capsules (200 mg total) by mouth daily. 10 capsule 0   famotidine (PEPCID) 20 MG tablet Take 1 tablet (20 mg total) by mouth daily. 30 tablet 0   FLUoxetine (PROZAC) 20 MG capsule Take 1 capsule (20 mg total) by mouth at bedtime. 30 capsule 0   fluticasone (FLONASE) 50 MCG/ACT nasal spray Place 1 spray into both nostrils daily. 16 g 0   iron polysaccharides (NIFEREX) 150 MG capsule Take 150 mg by mouth daily.     levothyroxine (SYNTHROID) 88 MCG tablet Take 1 tablet (88 mcg total) by mouth daily. 30 tablet 0   meloxicam (MOBIC) 15 MG tablet Take 1 tablet (15 mg total) by mouth daily. 30 tablet 0   Multiple Vitamin (MULTIVITAMIN) tablet Take 1 tablet by mouth daily.     potassium chloride (KLOR-CON M) 10 MEQ tablet Take 1 tablet (10 mEq total) by mouth at bedtime. 30 tablet 0   tamsulosin (FLOMAX) 0.4 MG CAPS capsule Take 1 capsule (0.4 mg total) by mouth daily after supper. 30 capsule 0   enzalutamide (XTANDI) 40 MG tablet Take 4 tablets (160 mg total) by mouth daily. 120 tablet 1   rosuvastatin (CRESTOR) 5 MG tablet Take 5 mg by mouth daily. (Patient not taking: Reported on 01/13/2022)     spironolactone (ALDACTONE) 25 MG tablet Take 12.5 mg by mouth daily. (Patient not taking: Reported on 01/13/2022)     No current facility-administered medications for this visit.    Review of Systems  Constitutional:  Positive for fatigue. Negative for appetite change and chills.  HENT:   Negative for hearing loss.   Eyes:   Negative for icterus.  Respiratory:  Negative for cough and shortness of breath.   Cardiovascular:  Negative for chest pain.  Gastrointestinal:  Negative for abdominal pain and blood in stool.  Genitourinary:         + Foley catheter  Musculoskeletal:  Negative for back pain.  Skin:  Negative for rash.  Neurological:  Positive for extremity weakness.     PHYSICAL EXAMINATION: ECOG PERFORMANCE STATUS: 2 - Symptomatic, <50% confined to bed  Vitals:   01/13/22 1101  BP: (!) 153/76  Pulse: 64  Temp: 98.2 F (36.8 C)  SpO2: 97%   There were no vitals filed for this visit.   Physical Exam Constitutional:  General: He is not in acute distress.    Appearance: He is not diaphoretic.     Comments: Patient sits in wheelchair.  HENT:     Head: Normocephalic and atraumatic.     Nose: Nose normal.     Mouth/Throat:     Pharynx: No oropharyngeal exudate.  Eyes:     General: No scleral icterus. Cardiovascular:     Rate and Rhythm: Normal rate.  Pulmonary:     Effort: Pulmonary effort is normal. No respiratory distress.  Abdominal:     General: There is no distension.  Musculoskeletal:        General: Normal range of motion.     Cervical back: Normal range of motion.  Skin:    General: Skin is warm and dry.     Findings: No erythema.  Neurological:     Mental Status: He is alert and oriented to person, place, and time.     Cranial Nerves: No cranial nerve deficit.     Motor: No abnormal muscle tone.     Comments: Decreased lower extremity strength.  Psychiatric:        Mood and Affect: Mood and affect normal.      LABORATORY DATA:  I have reviewed the data as listed    Latest Ref Rng & Units 01/13/2022    9:51 AM 12/04/2021    9:57 AM 11/17/2021    8:06 AM  CBC  WBC 4.0 - 10.5 K/uL 4.7  5.6  4.8   Hemoglobin 13.0 - 17.0 g/dL 14.0  13.9  11.5   Hematocrit 39.0 - 52.0 % 43.5  41.9  34.4   Platelets 150 - 400 K/uL 244  274  219       Latest Ref Rng & Units  01/13/2022    9:51 AM 12/04/2021    9:57 AM 11/18/2021    8:54 AM  CMP  Glucose 70 - 99 mg/dL 86  95  131   BUN 8 - 23 mg/dL _0 Creatinine 0.61 - 1.24 mg/dL 0.43  0.42  0.53   Sodium 135 - 145 mmol/L 137  137  139   Potassium 3.5 - 5.1 mmol/L 3.4  3.6  3.6   Chloride 98 - 111 mmol/L 102  102  104   CO2 22 - 32 mmol/L _1 Calcium 8.9 - 10.3 mg/dL 8.8  9.1  8.8   Total Protein 6.5 - 8.1 g/dL 7.1  7.3  6.0   Total Bilirubin 0.3 - 1.2 mg/dL 0.8  0.8  0.7   Alkaline Phos 38 - 126 U/L 172  320  474   AST 15 - 41 U/L 26  36  29   ALT 0 - 44 U/L 20  38  31      RADIOGRAPHIC STUDIES: I have personally reviewed the radiological images as listed and agreed with the findings in the report. US Venous Img Lower Unilateral Left  Result Date: 01/08/2022 CLINICAL DATA:  History left gastrocnemius vein DVT in August. EXAM: LEFT LOWER EXTREMITY VENOUS DOPPLER ULTRASOUND TECHNIQUE: Gray-scale sonography with graded compression, as well as color Doppler and duplex ultrasound were performed to evaluate the lower extremity deep venous systems from the level of the common femoral vein and including the common femoral, femoral, profunda femoral, popliteal and calf veins including the posterior tibial, peroneal and gastrocnemius veins when visible. The superficial great saphenous vein was also interrogated. Spectral Doppler was utilized  to evaluate flow at rest and with distal augmentation maneuvers in the common femoral, femoral and popliteal veins. COMPARISON:  10/11/2021 and prior study at [facility] on 10/18/2021. FINDINGS: Contralateral Common Femoral Vein: Respiratory phasicity is normal and symmetric with the symptomatic side. No evidence of thrombus. Normal compressibility. Common Femoral Vein: No evidence of thrombus. Normal compressibility, respiratory phasicity and response to augmentation. Saphenofemoral Junction: No evidence of thrombus. Normal compressibility and flow on color Doppler  imaging. Profunda Femoral Vein: No evidence of thrombus. Normal compressibility and flow on color Doppler imaging. Femoral Vein: No evidence of thrombus. Normal compressibility, respiratory phasicity and response to augmentation. Popliteal Vein: No evidence of thrombus. Normal compressibility, respiratory phasicity and response to augmentation. Calf Veins: No evidence of thrombus. No evidence of visualized gastrocnemius vein thrombus. Normal compressibility and flow on color Doppler imaging. Superficial Great Saphenous Vein: No evidence of thrombus. Normal compressibility. Venous Reflux:  None. Other Findings: No evidence of superficial thrombophlebitis or abnormal fluid collection. IMPRESSION: No evidence of left lower extremity deep venous thrombosis. No evidence of visualized gastrocnemius vein thrombus. Electronically Signed   By: Aletta Edouard M.D.   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 Currently on chemoprevention therapy with Eligard.  Status post palliative radiation to spine. PMSA PET scan showed partial response. Patient is not a candidate for chemotherapy with docetaxel. Recommend Xtandi 160 mg daily. Rationale and side effects were reviewed and discussed with patient.  Patient also met oral chemotherapy pharmacist Alyson today as well.  History of deep vein thrombosis (DVT) of lower extremity Per Dr.Lovorn Megan's note, patient developed left gastrocnemius vein DVT 8/26.  Provoked by immobilization. Repeat ultrasound showed no blood clots. Discussed with patient that he may develop recurrent DVT due to immobility secondary to paralysis.  We reviewed about Venetoclax symptoms.  Eliquis interacts with Xtandi.  I recommend patient to stop Eliquis. Recommend patient to increase aspirin to 162 mg daily for prophylaxis.   No orders of the defined types  were placed in this encounter.     All questions were answered. The patient knows to call the clinic with any problems, questions or concerns. No barriers to learning was detected.  Earlie Server, MD 01/13/2022

## 2022-01-13 NOTE — Progress Notes (Signed)
[facility]  [phone] [phone]  Patient Care Team: Venia Carbon, MD as PCP - General (Internal Medicine) End, Harrell Gave, MD as PCP - Cardiology (Cardiology) Katheren Puller, RN as Oncology Nurse Navigator   Name of the patient: Jason [name]  [id number]  03/29/1955   Date of visit: 01/13/22  HPI: Patient is a 66 y.o. male with metastatic castration sensitive prostate cancer. He is currently receiving ADT injections and Xtandi (enzalutamide) will be added to his treatment regimen.   Reason for Consult: Enzalutamide oral chemotherapy education.   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  Associated epidural disease greater at the C6 where there is marked canal stenosis without cord compression.  Left greater than right foraminal effacement due to the extraosseous disease.   10/03/2021 Imaging   MRI left shoulder showed High-riding humeral head with massive full-thickness tear of the entire supraspinatus tendon and the anterior 50% of the infraspinatus tendon. Additional partial-thickness articular sided tearing of the posterior infraspinatus. Moderate to high-grade supraspinatus and infraspinatus muscle atrophy suggests these tears  are chronic. Partial-thickness tearing of the superior greater than inferior  aspects of the subscapularis tendon footprint. Mild subscapularis muscle atrophy.  Moderate degenerative changes  of the acromioclavicular joint. Moderate glenohumeral cartilage degenerative changes.   10/05/2021 Imaging   CT chest abdomen pelvis with contrast showed  Moderate severity retroperitoneal and pelvic lymphadenopathy,consistent with metastatic disease. Small lytic areas at the levels of L4 and S1, with diffusely sclerotic changes involving the second and fourth right ribs, sacrum and left iliac bone. 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  Sigmoid diverticulosis. Aortic atherosclerosis   10/05/2021 Imaging   CT cervical spine without contrast Extensive heterogeneous sclerotic lesions throughout the cervical spine compatible with metastatic disease to the bone.  Mixed sclerotic and lytic changes in the posterior elements at C5 and C6.Extraosseous tumor at C5 and C6 is better appreciated on the MRI scan.   10/05/2021 Tumor Marker   PSA >1500   10/06/2021 Cancer Staging   Staging form: Prostate, AJCC 8th Edition - Clinical stage from 10/06/2021: Stage IVB (cN1, pM1c, PSA: 1500) - Signed by Earlie Server, MD on 12/04/2021 Stage prefix: Initial diagnosis Prostate specific antigen (PSA) range: 20 or greater   10/08/2021 Imaging   MRI thoracic spine and the lumbar spine with and without contrast 1. 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 2. 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 3. Additional epidural tumor along the posterior endplates at E2,A83, and T12 without high-grade spinal canal stenosis or cord compression. 4. Extensive epidural tumor in the lumbar spine is greatest in thickness at L1 without high-grade  spinal canal stenosis at this level;  however, superimposed on pre-existing degenerative changes lower in the lumbar spine results in severe spinal canal stenosis with cauda equina nerve root compression at L3-L4 and L4-L5. Multilevel neural foraminal stenosis is detailed above. 5. Postsurgical changes reflecting C6 laminectomy unchanged spinal canal stenosis compared to the postoperative study from 1 day prior.No definite cord signal abnormality. 6. Extramedullary fluid collection along the dorsal aspect of the cord beginning at T9-T10 extending into the lumbar spine is likely subdural in location but is of uncertain etiology; evolving blood products not excluded. The collection anteriorly displaces the cord and cauda equina nerve roots without frank cord compression or signal abnormality. There is no peripheral enhancement to suggest abscess. 7. Prevertebral edema in the cervical spine which may be postsurgical in nature. 8. New bilateral lower lobe consolidations may reflect atelectasis or developing pneumonia/aspiration.   10/08/2021 Imaging   MRI brain with and without contrast showed 1. 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 There is intracranial extension of tumor along the right frontal convexity measuring up to 5 mm in thickness without mass effect on the underlying brain parenchyma or midline shift. Extracranial extension of tumor in this location measures up to approximately 3 mm. 2. Additional extracranial extension of tumor into the scalp along the vertex measuring up to 7 mm in thickness. 3. Small focus of diffusion restriction in the right parietal cortex most in keeping with a small acute infarct. 4. No evidence of parenchymal metastatic disease.   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)  PSA >1500, patient initially presented with extremity weakness. 10/06/2021, patient underwent C6  cervical laminectomy and resection of tumor.  Pathology came back positive for metastatic prostate cancer. 8/14 developed significant bilateral upper and lower extremity weakness with hypotension requiring transfer to the ICU, pressors, steroid, respiratory failure on mechanical ventilation. Later patient was stabilized and extubated.  8/16 Firmagon loading dose '240mg'$   9/15 Eligard 22.'5mg'$  given inpatient during his rehab admission.     10/2021 -  Radiation Therapy   Palliative radiation to spine.    12/04/2021 Tumor Marker   PSA 67.09     ALLERGIES:  has No Known Allergies.  MEDICATIONS:  Current Outpatient Medications  Medication Sig Dispense Refill   acetaminophen (TYLENOL) 325 MG tablet Take 1-2 tablets (325-650 mg total) by mouth every 4 (four) hours as needed for mild pain.     ascorbic acid (VITAMIN C) 500 MG tablet Take 1 tablet (500 mg total) by mouth daily. 30 tablet 0   aspirin EC 81 MG tablet Take 1 tablet (81 mg total) by mouth daily. Swallow whole. 30 tablet 12   atorvastatin (LIPITOR) 40 MG tablet Take 1 tablet (40 mg total) by mouth daily. 90 tablet 3   bisacodyl (DULCOLAX) 10 MG suppository Place 1 suppository (10 mg total) rectally daily as needed for moderate constipation. 12 suppository 0   cetirizine (ZYRTEC) 10 MG tablet Take 10 mg by mouth at bedtime.     clonazePAM (KLONOPIN) 0.5 MG tablet Take 1 tablet (0.5 mg total) by mouth 2 (two) times daily. 60 tablet 0   docusate sodium (COLACE) 100 MG capsule Take 2 capsules (200 mg total) by mouth daily. 10 capsule 0   famotidine (PEPCID) 20 MG tablet Take 1 tablet (20 mg total) by mouth daily. 30 tablet 0   FLUoxetine (PROZAC) 20 MG capsule Take 1 capsule (20 mg total) by mouth at bedtime. 30 capsule 0  fluticasone (FLONASE) 50 MCG/ACT nasal spray Place 1 spray into both nostrils daily. 16 g 0   iron polysaccharides (NIFEREX) 150 MG capsule Take 150 mg by mouth daily.     levothyroxine (SYNTHROID) 88 MCG tablet Take 1  tablet (88 mcg total) by mouth daily. 30 tablet 0   meloxicam (MOBIC) 15 MG tablet Take 1 tablet (15 mg total) by mouth daily. 30 tablet 0   Multiple Vitamin (MULTIVITAMIN) tablet Take 1 tablet by mouth daily.     potassium chloride (KLOR-CON M) 10 MEQ tablet Take 1 tablet (10 mEq total) by mouth at bedtime. 30 tablet 0   rosuvastatin (CRESTOR) 5 MG tablet Take 5 mg by mouth daily. (Patient not taking: Reported on 01/13/2022)     spironolactone (ALDACTONE) 25 MG tablet Take 12.5 mg by mouth daily. (Patient not taking: Reported on 01/13/2022)     tamsulosin (FLOMAX) 0.4 MG CAPS capsule Take 1 capsule (0.4 mg total) by mouth daily after supper. 30 capsule 0   No current facility-administered medications for this visit.    VITAL SIGNS: There were no vitals taken for this visit. There were no vitals filed for this visit.  Estimated body mass index is 27.91 kg/m as calculated from the following:   Height as of 12/25/21: '5\' 9"'$  (1.753 m).   Weight as of 12/25/21: 85.7 kg (189 lb).  LABS: CBC:    Component Value Date/Time   WBC 4.7 01/13/2022 0951   HGB 14.0 01/13/2022 0951   HGB 15.2 10/10/2019 1553   HCT 43.5 01/13/2022 0951   HCT 44.2 10/10/2019 1553   PLT 244 01/13/2022 0951   PLT 284 10/10/2019 1553   MCV 92.6 01/13/2022 0951   MCV 93 10/10/2019 1553   NEUTROABS 3.6 01/13/2022 0951   NEUTROABS 4.1 08/28/2017 0956   LYMPHSABS 0.7 01/13/2022 0951   LYMPHSABS 1.6 08/28/2017 0956   MONOABS 0.3 01/13/2022 0951   EOSABS 0.1 01/13/2022 0951   EOSABS 0.2 08/28/2017 0956   BASOSABS 0.1 01/13/2022 0951   BASOSABS 0.1 08/28/2017 0956   Comprehensive Metabolic Panel:    Component Value Date/Time   NA 137 01/13/2022 0951   NA 140 01/29/2021 0822   K 3.4 (L) 01/13/2022 0951   CL 102 01/13/2022 0951   CO2 29 01/13/2022 0951   BUN 10 01/13/2022 0951   BUN 16 01/29/2021 0822   CREATININE 0.43 (L) 01/13/2022 0951   GLUCOSE 86 01/13/2022 0951   CALCIUM 8.8 (L) 01/13/2022 0951   AST 26  01/13/2022 0951   ALT 20 01/13/2022 0951   ALKPHOS 172 (H) 01/13/2022 0951   BILITOT 0.8 01/13/2022 0951   BILITOT 1.1 01/29/2021 0822   PROT 7.1 01/13/2022 0951   PROT 7.2 01/29/2021 0822   ALBUMIN 3.6 01/13/2022 0951   ALBUMIN 4.4 01/29/2021 4627     Present during today's visit: patient and wife  Start plan: Patient will start once he has medication on hand   Patient Education I spoke with patient for overview of new oral chemotherapy medication: enzalutamide   Administration: Counseled patient on administration, dosing, side effects, monitoring, drug-food interactions, safe handling, storage, and disposal. Patient will take 4 tablets (160 mg total) by mouth daily .  Side Effects: Side effects include but not limited to: fatigue and hypertension.    Drug-drug Interactions (DDI): Patient will be stopping apixaban per discussion with MD today Other DDIs: Enzalutamide may decrease the serum concentration of atorvastatin and clonazepam. Monitor patient for decreased effectiveness of the listed medications.  No baseline dose adjustment needed.   Adherence: After discussion with patient no patient barriers to medication adherence identified.  Reviewed with patient importance of keeping a medication schedule and plan for any missed doses.  The [facility] voiced understanding and appreciation. All questions answered. Medication handout provided.  Provided patient with [facility] phone number. Patient knows to call the office with questions or concerns. [facility] will continue to follow.  Patient expressed understanding and was in agreement with this plan. He also understands that He can call clinic at any time with any questions, concerns, or complaints.   Medication Access Issues: Prescription will be sent to [facility] [place] already approved   Thank you for allowing me to participate in the care of this patient.    Time Total: 25 mins  Visit consisted of counseling and education on dealing with issues of symptom management in the setting of serious and potentially life-threatening illness.Greater than 50%  of this time was spent counseling and coordinating care related to the above assessment and plan.  Signed by: Darl Pikes, PharmD, BCPS, Salley Slaughter, CPP Hematology/Oncology Clinical Pharmacist Practitioner [place]/DB/AP [facility] [phone]  01/13/2022 12:41 PM

## 2022-01-13 NOTE — Progress Notes (Signed)
Patient is here for follow-up and Ultrasound results.

## 2022-01-14 ENCOUNTER — Encounter: Payer: Self-pay | Admitting: Pharmacist

## 2022-01-14 DIAGNOSIS — C61 Malignant neoplasm of prostate: Secondary | ICD-10-CM | POA: Diagnosis not present

## 2022-01-14 DIAGNOSIS — D509 Iron deficiency anemia, unspecified: Secondary | ICD-10-CM | POA: Diagnosis not present

## 2022-01-14 DIAGNOSIS — I200 Unstable angina: Secondary | ICD-10-CM | POA: Diagnosis not present

## 2022-01-14 DIAGNOSIS — J9691 Respiratory failure, unspecified with hypoxia: Secondary | ICD-10-CM | POA: Diagnosis not present

## 2022-01-14 DIAGNOSIS — G8254 Quadriplegia, C5-C7 incomplete: Secondary | ICD-10-CM | POA: Diagnosis not present

## 2022-01-14 DIAGNOSIS — C7951 Secondary malignant neoplasm of bone: Secondary | ICD-10-CM | POA: Diagnosis not present

## 2022-01-14 DIAGNOSIS — N400 Enlarged prostate without lower urinary tract symptoms: Secondary | ICD-10-CM | POA: Diagnosis not present

## 2022-01-14 DIAGNOSIS — I10 Essential (primary) hypertension: Secondary | ICD-10-CM | POA: Diagnosis not present

## 2022-01-14 DIAGNOSIS — M1990 Unspecified osteoarthritis, unspecified site: Secondary | ICD-10-CM | POA: Diagnosis not present

## 2022-01-14 DIAGNOSIS — D630 Anemia in neoplastic disease: Secondary | ICD-10-CM | POA: Diagnosis not present

## 2022-01-14 DIAGNOSIS — C7940 Secondary malignant neoplasm of unspecified part of nervous system: Secondary | ICD-10-CM | POA: Diagnosis not present

## 2022-01-14 DIAGNOSIS — K219 Gastro-esophageal reflux disease without esophagitis: Secondary | ICD-10-CM | POA: Diagnosis not present

## 2022-01-14 DIAGNOSIS — I959 Hypotension, unspecified: Secondary | ICD-10-CM | POA: Diagnosis not present

## 2022-01-14 DIAGNOSIS — J9692 Respiratory failure, unspecified with hypercapnia: Secondary | ICD-10-CM | POA: Diagnosis not present

## 2022-01-14 DIAGNOSIS — F419 Anxiety disorder, unspecified: Secondary | ICD-10-CM | POA: Diagnosis not present

## 2022-01-14 DIAGNOSIS — R339 Retention of urine, unspecified: Secondary | ICD-10-CM | POA: Diagnosis not present

## 2022-01-14 DIAGNOSIS — E039 Hypothyroidism, unspecified: Secondary | ICD-10-CM | POA: Diagnosis not present

## 2022-01-15 DIAGNOSIS — I63521 Cerebral infarction due to unspecified occlusion or stenosis of right anterior cerebral artery: Secondary | ICD-10-CM | POA: Diagnosis not present

## 2022-01-15 DIAGNOSIS — G8250 Quadriplegia, unspecified: Secondary | ICD-10-CM | POA: Diagnosis not present

## 2022-01-17 DIAGNOSIS — N400 Enlarged prostate without lower urinary tract symptoms: Secondary | ICD-10-CM | POA: Diagnosis not present

## 2022-01-17 DIAGNOSIS — J9691 Respiratory failure, unspecified with hypoxia: Secondary | ICD-10-CM | POA: Diagnosis not present

## 2022-01-17 DIAGNOSIS — M1990 Unspecified osteoarthritis, unspecified site: Secondary | ICD-10-CM | POA: Diagnosis not present

## 2022-01-17 DIAGNOSIS — C7940 Secondary malignant neoplasm of unspecified part of nervous system: Secondary | ICD-10-CM | POA: Diagnosis not present

## 2022-01-17 DIAGNOSIS — C7951 Secondary malignant neoplasm of bone: Secondary | ICD-10-CM | POA: Diagnosis not present

## 2022-01-17 DIAGNOSIS — F419 Anxiety disorder, unspecified: Secondary | ICD-10-CM | POA: Diagnosis not present

## 2022-01-17 DIAGNOSIS — G8254 Quadriplegia, C5-C7 incomplete: Secondary | ICD-10-CM | POA: Diagnosis not present

## 2022-01-17 DIAGNOSIS — E039 Hypothyroidism, unspecified: Secondary | ICD-10-CM | POA: Diagnosis not present

## 2022-01-17 DIAGNOSIS — J9692 Respiratory failure, unspecified with hypercapnia: Secondary | ICD-10-CM | POA: Diagnosis not present

## 2022-01-17 DIAGNOSIS — I200 Unstable angina: Secondary | ICD-10-CM | POA: Diagnosis not present

## 2022-01-17 DIAGNOSIS — I10 Essential (primary) hypertension: Secondary | ICD-10-CM | POA: Diagnosis not present

## 2022-01-17 DIAGNOSIS — D509 Iron deficiency anemia, unspecified: Secondary | ICD-10-CM | POA: Diagnosis not present

## 2022-01-17 DIAGNOSIS — I959 Hypotension, unspecified: Secondary | ICD-10-CM | POA: Diagnosis not present

## 2022-01-17 DIAGNOSIS — K219 Gastro-esophageal reflux disease without esophagitis: Secondary | ICD-10-CM | POA: Diagnosis not present

## 2022-01-17 DIAGNOSIS — D630 Anemia in neoplastic disease: Secondary | ICD-10-CM | POA: Diagnosis not present

## 2022-01-17 DIAGNOSIS — C61 Malignant neoplasm of prostate: Secondary | ICD-10-CM | POA: Diagnosis not present

## 2022-01-18 DIAGNOSIS — G8250 Quadriplegia, unspecified: Secondary | ICD-10-CM | POA: Diagnosis not present

## 2022-01-18 DIAGNOSIS — C61 Malignant neoplasm of prostate: Secondary | ICD-10-CM | POA: Diagnosis not present

## 2022-01-18 DIAGNOSIS — C7940 Secondary malignant neoplasm of unspecified part of nervous system: Secondary | ICD-10-CM | POA: Diagnosis not present

## 2022-01-19 ENCOUNTER — Emergency Department: Payer: BC Managed Care – PPO

## 2022-01-19 ENCOUNTER — Inpatient Hospital Stay
Admission: EM | Admit: 2022-01-19 | Discharge: 2022-01-22 | DRG: 698 | Disposition: A | Discharge status: Home / Self Care | Payer: BC Managed Care – PPO | Attending: Family Medicine | Admitting: Family Medicine

## 2022-01-19 ENCOUNTER — Encounter: Payer: Self-pay | Admitting: Emergency Medicine

## 2022-01-19 ENCOUNTER — Other Ambulatory Visit: Payer: Self-pay

## 2022-01-19 DIAGNOSIS — Z8673 Personal history of transient ischemic attack (TIA), and cerebral infarction without residual deficits: Secondary | ICD-10-CM

## 2022-01-19 DIAGNOSIS — Z981 Arthrodesis status: Secondary | ICD-10-CM

## 2022-01-19 DIAGNOSIS — Z20822 Contact with and (suspected) exposure to covid-19: Secondary | ICD-10-CM | POA: Diagnosis not present

## 2022-01-19 DIAGNOSIS — Z79890 Hormone replacement therapy (postmenopausal): Secondary | ICD-10-CM

## 2022-01-19 DIAGNOSIS — C61 Malignant neoplasm of prostate: Secondary | ICD-10-CM | POA: Diagnosis not present

## 2022-01-19 DIAGNOSIS — Z79899 Other long term (current) drug therapy: Secondary | ICD-10-CM | POA: Diagnosis not present

## 2022-01-19 DIAGNOSIS — Z96643 Presence of artificial hip joint, bilateral: Secondary | ICD-10-CM | POA: Diagnosis present

## 2022-01-19 DIAGNOSIS — T83511A Infection and inflammatory reaction due to indwelling urethral catheter, initial encounter: Principal | ICD-10-CM | POA: Diagnosis present

## 2022-01-19 DIAGNOSIS — N400 Enlarged prostate without lower urinary tract symptoms: Secondary | ICD-10-CM | POA: Diagnosis present

## 2022-01-19 DIAGNOSIS — Z791 Long term (current) use of non-steroidal anti-inflammatories (NSAID): Secondary | ICD-10-CM

## 2022-01-19 DIAGNOSIS — K219 Gastro-esophageal reflux disease without esophagitis: Secondary | ICD-10-CM | POA: Diagnosis present

## 2022-01-19 DIAGNOSIS — J189 Pneumonia, unspecified organism: Secondary | ICD-10-CM

## 2022-01-19 DIAGNOSIS — D71 Functional disorders of polymorphonuclear neutrophils: Secondary | ICD-10-CM | POA: Diagnosis present

## 2022-01-19 DIAGNOSIS — N319 Neuromuscular dysfunction of bladder, unspecified: Secondary | ICD-10-CM | POA: Diagnosis present

## 2022-01-19 DIAGNOSIS — Z86718 Personal history of other venous thrombosis and embolism: Secondary | ICD-10-CM

## 2022-01-19 DIAGNOSIS — C7949 Secondary malignant neoplasm of other parts of nervous system: Secondary | ICD-10-CM | POA: Diagnosis present

## 2022-01-19 DIAGNOSIS — I10 Essential (primary) hypertension: Secondary | ICD-10-CM | POA: Diagnosis not present

## 2022-01-19 DIAGNOSIS — J9811 Atelectasis: Secondary | ICD-10-CM | POA: Diagnosis not present

## 2022-01-19 DIAGNOSIS — R509 Fever, unspecified: Secondary | ICD-10-CM | POA: Diagnosis present

## 2022-01-19 DIAGNOSIS — Y846 Urinary catheterization as the cause of abnormal reaction of the patient, or of later complication, without mention of misadventure at the time of the procedure: Secondary | ICD-10-CM | POA: Diagnosis present

## 2022-01-19 DIAGNOSIS — J439 Emphysema, unspecified: Secondary | ICD-10-CM | POA: Diagnosis not present

## 2022-01-19 DIAGNOSIS — E039 Hypothyroidism, unspecified: Secondary | ICD-10-CM | POA: Diagnosis present

## 2022-01-19 DIAGNOSIS — I69365 Other paralytic syndrome following cerebral infarction, bilateral: Secondary | ICD-10-CM

## 2022-01-19 DIAGNOSIS — Z7401 Bed confinement status: Secondary | ICD-10-CM | POA: Diagnosis not present

## 2022-01-19 DIAGNOSIS — J8410 Pulmonary fibrosis, unspecified: Secondary | ICD-10-CM | POA: Diagnosis not present

## 2022-01-19 DIAGNOSIS — Z96612 Presence of left artificial shoulder joint: Secondary | ICD-10-CM | POA: Diagnosis present

## 2022-01-19 DIAGNOSIS — B961 Klebsiella pneumoniae [K. pneumoniae] as the cause of diseases classified elsewhere: Secondary | ICD-10-CM | POA: Diagnosis present

## 2022-01-19 DIAGNOSIS — R531 Weakness: Secondary | ICD-10-CM | POA: Diagnosis not present

## 2022-01-19 DIAGNOSIS — Z8701 Personal history of pneumonia (recurrent): Secondary | ICD-10-CM

## 2022-01-19 DIAGNOSIS — Z7982 Long term (current) use of aspirin: Secondary | ICD-10-CM | POA: Diagnosis not present

## 2022-01-19 DIAGNOSIS — N390 Urinary tract infection, site not specified: Secondary | ICD-10-CM | POA: Diagnosis present

## 2022-01-19 DIAGNOSIS — I63521 Cerebral infarction due to unspecified occlusion or stenosis of right anterior cerebral artery: Secondary | ICD-10-CM | POA: Diagnosis present

## 2022-01-19 DIAGNOSIS — E876 Hypokalemia: Secondary | ICD-10-CM | POA: Diagnosis present

## 2022-01-19 DIAGNOSIS — D539 Nutritional anemia, unspecified: Secondary | ICD-10-CM | POA: Diagnosis present

## 2022-01-19 DIAGNOSIS — D649 Anemia, unspecified: Secondary | ICD-10-CM | POA: Diagnosis not present

## 2022-01-19 DIAGNOSIS — J168 Pneumonia due to other specified infectious organisms: Secondary | ICD-10-CM | POA: Diagnosis not present

## 2022-01-19 DIAGNOSIS — G8250 Quadriplegia, unspecified: Secondary | ICD-10-CM | POA: Diagnosis present

## 2022-01-19 DIAGNOSIS — I959 Hypotension, unspecified: Secondary | ICD-10-CM | POA: Diagnosis not present

## 2022-01-19 DIAGNOSIS — W19XXXA Unspecified fall, initial encounter: Secondary | ICD-10-CM | POA: Diagnosis not present

## 2022-01-19 DIAGNOSIS — T83511S Infection and inflammatory reaction due to indwelling urethral catheter, sequela: Secondary | ICD-10-CM | POA: Diagnosis not present

## 2022-01-19 LAB — URINALYSIS, ROUTINE W REFLEX MICROSCOPIC
Bilirubin Urine: NEGATIVE
Glucose, UA: NEGATIVE mg/dL
Ketones, ur: NEGATIVE mg/dL
Nitrite: POSITIVE — AB
Protein, ur: NEGATIVE mg/dL
Specific Gravity, Urine: 1.006 (ref 1.005–1.030)
WBC, UA: >50 WBC/hpf — ABNORMAL HIGH (ref 0–5)
pH: 7 (ref 5.0–8.0)

## 2022-01-19 LAB — COMPREHENSIVE METABOLIC PANEL
ALT: 22 U/L (ref 0–44)
AST: 25 U/L (ref 15–41)
Albumin: 3.5 g/dL (ref 3.5–5.0)
Alkaline Phosphatase: 169 U/L — ABNORMAL HIGH (ref 38–126)
Anion gap: 9 (ref 5–15)
BUN: 13 mg/dL (ref 8–23)
CO2: 24 mmol/L (ref 22–32)
Calcium: 8.9 mg/dL (ref 8.9–10.3)
Chloride: 102 mmol/L (ref 98–111)
Creatinine, Ser: 0.35 mg/dL — ABNORMAL LOW (ref 0.61–1.24)
GFR, Estimated: >60 mL/min (ref >60)
Glucose, Bld: 121 mg/dL — ABNORMAL HIGH (ref 70–99)
Potassium: 3.3 mmol/L — ABNORMAL LOW (ref 3.5–5.1)
Sodium: 135 mmol/L (ref 135–145)
Total Bilirubin: 1.2 mg/dL (ref 0.3–1.2)
Total Protein: 6.8 g/dL (ref 6.5–8.1)

## 2022-01-19 LAB — CBC WITH DIFFERENTIAL/PLATELET
Abs Immature Granulocytes: 0.03 10*3/uL (ref 0.00–0.07)
Basophils Absolute: 0.1 10*3/uL (ref 0.0–0.1)
Basophils Relative: 1 %
Eosinophils Absolute: 0 10*3/uL (ref 0.0–0.5)
Eosinophils Relative: 0 %
HCT: 38.2 % — ABNORMAL LOW (ref 39.0–52.0)
Hemoglobin: 12.8 g/dL — ABNORMAL LOW (ref 13.0–17.0)
Immature Granulocytes: 0 %
Lymphocytes Relative: 7 %
Lymphs Abs: 0.6 10*3/uL — ABNORMAL LOW (ref 0.7–4.0)
MCH: 30 pg (ref 26.0–34.0)
MCHC: 33.5 g/dL (ref 30.0–36.0)
MCV: 89.5 fL (ref 80.0–100.0)
Monocytes Absolute: 1 10*3/uL (ref 0.1–1.0)
Monocytes Relative: 10 %
Neutro Abs: 7.9 10*3/uL — ABNORMAL HIGH (ref 1.7–7.7)
Neutrophils Relative %: 82 %
Platelets: 206 10*3/uL (ref 150–400)
RBC: 4.27 MIL/uL (ref 4.22–5.81)
RDW: 14.7 % (ref 11.5–15.5)
WBC: 9.6 10*3/uL (ref 4.0–10.5)
nRBC: 0 % (ref 0.0–0.2)

## 2022-01-19 LAB — LACTIC ACID, PLASMA: Lactic Acid, Venous: 0.8 mmol/L (ref 0.5–1.9)

## 2022-01-19 LAB — RESP PANEL BY RT-PCR (FLU A&B, COVID) ARPGX2
Influenza A by PCR: NEGATIVE
Influenza B by PCR: NEGATIVE
SARS Coronavirus 2 by RT PCR: NEGATIVE

## 2022-01-19 MED ORDER — SODIUM CHLORIDE 0.9 % IV BOLUS (SEPSIS)
1000.0000 mL | Freq: Once | INTRAVENOUS | Status: AC
  Administered 2022-01-20: 1000 mL via INTRAVENOUS

## 2022-01-19 MED ORDER — SODIUM CHLORIDE 0.9 % IV SOLN
2.0000 g | Freq: Once | INTRAVENOUS | Status: AC
  Administered 2022-01-20: 2 g via INTRAVENOUS
  Filled 2022-01-19: qty 20

## 2022-01-19 MED ORDER — SODIUM CHLORIDE 0.9 % IV SOLN
500.0000 mg | Freq: Once | INTRAVENOUS | Status: AC
  Administered 2022-01-20: 500 mg via INTRAVENOUS
  Filled 2022-01-19: qty 5

## 2022-01-19 NOTE — ED Notes (Signed)
First nurse note: Pt is a prostate cancer patient - received his first treatment of chemotherapy today at 0800 and developed a fever this evening. Prior to arrival the temp was 101.8 - 99.1 after 529m of tylenol.   VS with EMS 160/90 91

## 2022-01-19 NOTE — ED Provider Notes (Signed)
[facility] Provider Note    Event Date/Time   First MD Initiated Contact with Patient 01/19/22 2316     (approximate)   History   Fever   HPI  Jason [name] is a 66 y.o. male with history of hypertension, hypothyroidism, metastatic prostate cancer who just started Xtandi who presents to the emergency department with fever and weakness today.  Denies any pain, vomiting or diarrhea.  Has an indwelling Foley catheter in place.   History provided by patient and wife.    Past Medical History:  Diagnosis Date   Allergy    Arthritis    osteoarthritis   GERD (gastroesophageal reflux disease)    Hypertension    Hypothyroidism    Renal calculi    Thyroid disease    Hyperthyroidism s/p radioactive iodine ablation    Past Surgical History:  Procedure Laterality Date   CARDIAC CATHETERIZATION  03/2000   HERNIA REPAIR  2376   umbilical   LEFT HEART CATH AND CORONARY ANGIOGRAPHY Left 03/20/2020   Procedure: LEFT HEART CATH AND CORONARY ANGIOGRAPHY;  Surgeon: Nelva Bush, MD;  Location: [place] [facility];  Service: Cardiovascular;  Laterality: Left;   POSTERIOR CERVICAL FUSION/FORAMINOTOMY N/A 10/06/2021   Procedure: POSTERIOR CERVICAL FUSION/ FORAMINOTOMY LEVEL 3;  Surgeon: Meade Maw, MD;  Location: [facility];  Service: Neurosurgery;  Laterality: N/A;  will need monitoring   RADIOLOGY WITH ANESTHESIA Left 04/18/2021   Procedure: MRI SHOULDER WITHOUT CONTRAST WITH ANESTHESIA;  Surgeon: Radiologist, Medication, MD;  Location: [place];  Service: Radiology;  Laterality: Left;   RADIOLOGY WITH ANESTHESIA N/A 10/03/2021   Procedure: MRI CERVICAL SPINE WITH ANESTHESIA;  Surgeon: Radiologist, Medication, MD;  Location: [place];  Service: Radiology;  Laterality: N/A;   REVERSE SHOULDER ARTHROPLASTY Left    ROTATOR CUFF REPAIR  11/2010   Dr [name] Heck   TONSILLECTOMY     TOTAL HIP ARTHROPLASTY Right 02/24/2009   TOTAL HIP ARTHROPLASTY Left 06/2016   Dr  Harlow Mares    MEDICATIONS:  Prior to Admission medications   Medication Sig Start Date End Date Taking? Authorizing Provider  acetaminophen (TYLENOL) 325 MG tablet Take 1-2 tablets (325-650 mg total) by mouth every 4 (four) hours as needed for mild pain. 11/19/21   Angiulli, Lavon Paganini, PA-C  ascorbic acid (VITAMIN C) 500 MG tablet Take 1 tablet (500 mg total) by mouth daily. 11/19/21   Angiulli, Lavon Paganini, PA-C  aspirin EC 81 MG tablet Take 1 tablet (81 mg total) by mouth daily. Swallow whole. 10/18/21   Fritzi Mandes, MD  atorvastatin (LIPITOR) 40 MG tablet Take 1 tablet (40 mg total) by mouth daily. 12/18/21   Venia Carbon, MD  bisacodyl (DULCOLAX) 10 MG suppository Place 1 suppository (10 mg total) rectally daily as needed for moderate constipation. 11/19/21   Angiulli, Lavon Paganini, PA-C  cetirizine (ZYRTEC) 10 MG tablet Take 10 mg by mouth at bedtime.    [provider]  clonazePAM (KLONOPIN) 0.5 MG tablet Take 1 tablet (0.5 mg total) by mouth 2 (two) times daily. 12/18/21   Venia Carbon, MD  docusate sodium (COLACE) 100 MG capsule Take 2 capsules (200 mg total) by mouth daily. 11/19/21   Angiulli, Lavon Paganini, PA-C  enzalutamide Gillermina Phy) 40 MG tablet Take 4 tablets (160 mg total) by mouth daily. 01/13/22   Earlie Server, MD  famotidine (PEPCID) 20 MG tablet Take 1 tablet (20 mg total) by mouth daily. 11/19/21   Angiulli, Lavon Paganini, PA-C  FLUoxetine (PROZAC)  20 MG capsule Take 1 capsule (20 mg total) by mouth at bedtime. 11/19/21   Angiulli, Lavon Paganini, PA-C  fluticasone (FLONASE) 50 MCG/ACT nasal spray Place 1 spray into both nostrils daily. 11/19/21   Angiulli, Lavon Paganini, PA-C  iron polysaccharides (NIFEREX) 150 MG capsule Take 150 mg by mouth daily.    [provider]  levothyroxine (SYNTHROID) 88 MCG tablet Take 1 tablet (88 mcg total) by mouth daily. 11/19/21   Angiulli, Lavon Paganini, PA-C  meloxicam (MOBIC) 15 MG tablet Take 1 tablet (15 mg total) by mouth daily. 11/19/21   Angiulli, Lavon Paganini,  PA-C  Multiple Vitamin (MULTIVITAMIN) tablet Take 1 tablet by mouth daily.    [provider]  potassium chloride (KLOR-CON M) 10 MEQ tablet Take 1 tablet (10 mEq total) by mouth at bedtime. 11/19/21   Angiulli, Lavon Paganini, PA-C  rosuvastatin (CRESTOR) 5 MG tablet Take 5 mg by mouth daily. Patient not taking: Reported on 01/13/2022 01/07/22   [provider]  spironolactone (ALDACTONE) 25 MG tablet Take 12.5 mg by mouth daily. Patient not taking: Reported on 01/13/2022 01/07/22   [provider]  tamsulosin (FLOMAX) 0.4 MG CAPS capsule Take 1 capsule (0.4 mg total) by mouth daily after supper. 11/19/21   Cathlyn Parsons, PA-C    Physical Exam   Triage Vital Signs: ED Triage Vitals  Enc Vitals Group     BP 01/19/22 2118 (!) 153/89     Pulse Rate 01/19/22 2118 85     Resp 01/19/22 2118 17     Temp 01/19/22 2118 99 F (37.2 C)     Temp Source 01/19/22 2118 Oral     SpO2 01/19/22 2118 92 %     Weight 01/19/22 2118 189 lb (85.7 kg)     Height 01/19/22 2118 '5\' 9"'$  (1.753 m)     Head Circumference --      Peak Flow --      Pain Score 01/19/22 2121 0     Pain Loc --      Pain Edu? --      Excl. in [place]? --     Most recent vital signs: Vitals:   01/19/22 2118 01/19/22 2319  BP: (!) 153/89 131/81  Pulse: 85 78  Resp: 17 18  Temp: 99 F (37.2 C) 98.9 F (37.2 C)  SpO2: 92% 95%    CONSTITUTIONAL: Alert and oriented and responds appropriately to questions.  Nontoxic but does appear weak on exam HEAD: Normocephalic, atraumatic EYES: Conjunctivae clear, pupils appear equal, sclera nonicteric ENT: normal nose; moist mucous membranes NECK: Supple, normal ROM CARD: RRR; S1 and S2 appreciated; no murmurs, no clicks, no rubs, no gallops RESP: Normal chest excursion without splinting or tachypnea; breath sounds clear and equal bilaterally; no wheezes, no rhonchi, no rales, no hypoxia or respiratory distress, speaking full sentences ABD/GI: Normal bowel sounds;  non-distended; soft, non-tender, no rebound, no guarding, no peritoneal signs BACK: The back appears normal EXT: Normal ROM in all joints; no deformity noted, no edema; no cyanosis SKIN: Normal color for age and race; warm; no rash on exposed skin NEURO: Moves all extremities equally, normal speech PSYCH: The patient's mood and manner are appropriate.   ED Results / Procedures / Treatments   LABS: (all labs ordered are listed, but only abnormal results are displayed) Labs Reviewed  URINALYSIS, ROUTINE W REFLEX MICROSCOPIC - Abnormal; Notable for the following components:      Result Value   Color, Urine YELLOW (*)  APPearance HAZY (*)    Hgb urine dipstick MODERATE (*)    Nitrite POSITIVE (*)    Leukocytes,Ua LARGE (*)    WBC, UA >50 (*)    Bacteria, UA MANY (*)    All other components within normal limits  CBC WITH DIFFERENTIAL/PLATELET - Abnormal; Notable for the following components:   Hemoglobin 12.8 (*)    HCT 38.2 (*)    Neutro Abs 7.9 (*)    Lymphs Abs 0.6 (*)    All other components within normal limits  COMPREHENSIVE METABOLIC PANEL - Abnormal; Notable for the following components:   Potassium 3.3 (*)    Glucose, Bld 121 (*)    Creatinine, Ser 0.35 (*)    Alkaline Phosphatase 169 (*)    All other components within normal limits  RESP PANEL BY RT-PCR (FLU A&B, COVID) ARPGX2  CULTURE, BLOOD (SINGLE)  CULTURE, BLOOD (SINGLE)  URINE CULTURE  LACTIC ACID, PLASMA  PROCALCITONIN     EKG:  EKG Interpretation  Date/Time:  Sunday January 19 2022 21:25:33 EST Ventricular Rate:  80 PR Interval:  150 QRS Duration: 86 QT Interval:  380 QTC Calculation: 438 R Axis:   34 Text Interpretation: Normal sinus rhythm ST & T wave abnormality, consider inferolateral ischemia Abnormal ECG When compared with ECG of 11-Mar-2021 20:56, T wave inversion now evident in Inferior leads T wave inversion now evident in Anterolateral leads Confirmed by Pryor Curia [phone]) on  01/19/2022 11:35:44 PM         RADIOLOGY: My personal review and interpretation of imaging: Chest x-ray shows right upper lobe pneumonia  I have personally reviewed all radiology reports.   DG Chest Port 1 View  Result Date: 01/19/2022 CLINICAL DATA:  [id number] Fever [id number] EXAM: PORTABLE CHEST 1 VIEW COMPARISON:  Chest x-ray 10/17/2021, PET CT 12/12/2021 FINDINGS: The heart and mediastinal contours are unchanged. Low lung volumes. Similar-appearing calcification overlying right lung and better evaluated on PET-CT 12/12/2021. Interval development of a right apical airspace opacity. No pulmonary edema. No pleural effusion. No pneumothorax. Sclerotic appearance of the appendicular skeleton consistent with known metastases. Reverse total left shoulder arthroplasty. IMPRESSION: Interval development of a right apical airspace opacity. Finding may be due to positioning. Recommend repeat PA and lateral view of the chest. Electronically Signed   By: Iven Finn M.D.   On: 01/19/2022 22:04     PROCEDURES:  Critical Care performed: Yes, see critical care procedure note(s)   CRITICAL CARE Performed by: Cyril Mourning Mavrick Mcquigg   Total critical care time: 45 minutes  Critical care time was exclusive of separately billable procedures and treating other patients.  Critical care was necessary to treat or prevent imminent or life-threatening deterioration.  Critical care was time spent personally by me on the following activities: development of treatment plan with patient and/or surrogate as well as nursing, discussions with consultants, evaluation of patient's response to treatment, examination of patient, obtaining history from patient or surrogate, ordering and performing treatments and interventions, ordering and review of laboratory studies, ordering and review of radiographic studies, pulse oximetry and re-evaluation of patient's condition.   Marland Kitchen1-3 Lead EKG Interpretation  Performed by: Zeina Akkerman, Delice Bison, DO Authorized by: Kambrie Eddleman, Delice Bison, DO     Interpretation: normal     ECG rate:  78   ECG rate assessment: normal     Rhythm: sinus rhythm     Ectopy: none     Conduction: normal       IMPRESSION / MDM / ASSESSMENT AND  PLAN / ED COURSE  I reviewed the triage vital signs and the nursing notes.    Patient here with fevers and weakness.  Recently started [facility] for cancer.  The patient is on the cardiac monitor to evaluate for evidence of arrhythmia and/or significant heart rate changes.   DIFFERENTIAL DIAGNOSIS (includes but not limited to):   Sepsis, bacteremia, pneumonia, UTI, symptomatic anemia, electrolyte derangement, dehydration   Patient's presentation is most consistent with acute presentation with potential threat to life or bodily function.   PLAN: Workup initiated from triage.  No leukocytosis.  No electrolyte derangement.  Lactic normal.  Urine appears infected.  Will send culture.  COVID and flu negative.  Chest x-ray reviewed and interpreted by myself and the radiologist and shows developing right upper lobe pneumonia.  Will give Rocephin and azithromycin to cover for both UTI and pneumonia.  Will discuss with hospitalist given the has 2 infections and is feeling very weak today.   MEDICATIONS GIVEN IN ED: Medications  cefTRIAXone (ROCEPHIN) 2 g in sodium chloride 0.9 % 100 mL IVPB (has no administration in time range)  azithromycin (ZITHROMAX) 500 mg in sodium chloride 0.9 % 250 mL IVPB (has no administration in time range)  sodium chloride 0.9 % bolus 1,000 mL (has no administration in time range)     ED COURSE: Added on procalcitonin.  Patient has been hemodynamically stable here.   CONSULTS:  Consulted and discussed patient's case with hospitalist, Dr. Posey Pronto.  I have recommended admission and consulting physician agrees and will place admission orders.  Patient (and family if present) agree with this plan.   I reviewed all nursing notes, vitals, pertinent  previous records.  All labs, EKGs, imaging ordered have been independently reviewed and interpreted by myself.    OUTSIDE RECORDS REVIEWED: Reviewed patient's recent oncology notes.       FINAL CLINICAL IMPRESSION(S) / ED DIAGNOSES   Final diagnoses:  Pneumonia of right upper lobe due to infectious organism  Acute UTI  Generalized weakness     Rx / DC Orders   ED Discharge Orders     None        Note:  This document was prepared using Dragon voice recognition software and may include unintentional dictation errors.   Findlay Dagher, Delice Bison, DO 01/19/22 2359

## 2022-01-19 NOTE — ED Triage Notes (Signed)
Pt presents via EMS from home - is a prostate cancer patient - received his first treatment of chemotherapy today at 0800 and developed a fever this evening. Prior to arrival the temp was 101.8 - 99.1 after 571m of tylenol.   VS with EMS 160/90 91

## 2022-01-20 ENCOUNTER — Emergency Department: Payer: BC Managed Care – PPO

## 2022-01-20 ENCOUNTER — Encounter: Payer: Self-pay | Admitting: Internal Medicine

## 2022-01-20 DIAGNOSIS — J8410 Pulmonary fibrosis, unspecified: Secondary | ICD-10-CM | POA: Diagnosis not present

## 2022-01-20 DIAGNOSIS — R509 Fever, unspecified: Secondary | ICD-10-CM | POA: Diagnosis not present

## 2022-01-20 DIAGNOSIS — J439 Emphysema, unspecified: Secondary | ICD-10-CM | POA: Diagnosis not present

## 2022-01-20 DIAGNOSIS — J9811 Atelectasis: Secondary | ICD-10-CM | POA: Diagnosis not present

## 2022-01-20 DIAGNOSIS — N390 Urinary tract infection, site not specified: Secondary | ICD-10-CM | POA: Diagnosis present

## 2022-01-20 LAB — COMPREHENSIVE METABOLIC PANEL
ALT: 18 U/L (ref 0–44)
AST: 21 U/L (ref 15–41)
Albumin: 3 g/dL — ABNORMAL LOW (ref 3.5–5.0)
Alkaline Phosphatase: 135 U/L — ABNORMAL HIGH (ref 38–126)
Anion gap: 8 (ref 5–15)
BUN: 14 mg/dL (ref 8–23)
CO2: 24 mmol/L (ref 22–32)
Calcium: 8.7 mg/dL — ABNORMAL LOW (ref 8.9–10.3)
Chloride: 107 mmol/L (ref 98–111)
Creatinine, Ser: 0.43 mg/dL — ABNORMAL LOW (ref 0.61–1.24)
GFR, Estimated: >60 mL/min (ref >60)
Glucose, Bld: 103 mg/dL — ABNORMAL HIGH (ref 70–99)
Potassium: 3.2 mmol/L — ABNORMAL LOW (ref 3.5–5.1)
Sodium: 139 mmol/L (ref 135–145)
Total Bilirubin: 1 mg/dL (ref 0.3–1.2)
Total Protein: 5.7 g/dL — ABNORMAL LOW (ref 6.5–8.1)

## 2022-01-20 LAB — CBC
HCT: 34 % — ABNORMAL LOW (ref 39.0–52.0)
Hemoglobin: 11.1 g/dL — ABNORMAL LOW (ref 13.0–17.0)
MCH: 29.6 pg (ref 26.0–34.0)
MCHC: 32.6 g/dL (ref 30.0–36.0)
MCV: 90.7 fL (ref 80.0–100.0)
Platelets: 174 10*3/uL (ref 150–400)
RBC: 3.75 MIL/uL — ABNORMAL LOW (ref 4.22–5.81)
RDW: 14.8 % (ref 11.5–15.5)
WBC: 6.8 10*3/uL (ref 4.0–10.5)
nRBC: 0 % (ref 0.0–0.2)

## 2022-01-20 LAB — MAGNESIUM: Magnesium: 2 mg/dL (ref 1.7–2.4)

## 2022-01-20 LAB — PROCALCITONIN: Procalcitonin: <0.1 ng/mL

## 2022-01-20 MED ORDER — SODIUM CHLORIDE 0.9% FLUSH
3.0000 mL | Freq: Two times a day (BID) | INTRAVENOUS | Status: DC
  Administered 2022-01-20 – 2022-01-21 (×4): 3 mL via INTRAVENOUS

## 2022-01-20 MED ORDER — ATORVASTATIN CALCIUM 20 MG PO TABS
40.0000 mg | ORAL_TABLET | Freq: Every day | ORAL | Status: DC
  Administered 2022-01-20 – 2022-01-22 (×3): 40 mg via ORAL
  Filled 2022-01-20 (×3): qty 2

## 2022-01-20 MED ORDER — POTASSIUM CHLORIDE CRYS ER 20 MEQ PO TBCR
60.0000 meq | EXTENDED_RELEASE_TABLET | Freq: Once | ORAL | Status: AC
  Administered 2022-01-20: 60 meq via ORAL
  Filled 2022-01-20: qty 3

## 2022-01-20 MED ORDER — DOCUSATE SODIUM 100 MG PO CAPS
200.0000 mg | ORAL_CAPSULE | Freq: Every day | ORAL | Status: DC
  Administered 2022-01-20: 200 mg via ORAL
  Administered 2022-01-21: 100 mg via ORAL
  Administered 2022-01-22: 200 mg via ORAL
  Filled 2022-01-20 (×3): qty 2

## 2022-01-20 MED ORDER — FAMOTIDINE 20 MG PO TABS
20.0000 mg | ORAL_TABLET | Freq: Every day | ORAL | Status: DC
  Administered 2022-01-20 – 2022-01-22 (×3): 20 mg via ORAL
  Filled 2022-01-20 (×3): qty 1

## 2022-01-20 MED ORDER — TAMSULOSIN HCL 0.4 MG PO CAPS
0.4000 mg | ORAL_CAPSULE | Freq: Every day | ORAL | Status: DC
  Administered 2022-01-20 – 2022-01-21 (×2): 0.4 mg via ORAL
  Filled 2022-01-20 (×2): qty 1

## 2022-01-20 MED ORDER — ACETAMINOPHEN 325 MG PO TABS: 650.0000 mg | ORAL_TABLET | Freq: Four times a day (QID) | ORAL | Status: DC | PRN

## 2022-01-20 MED ORDER — SENNA 8.6 MG PO TABS
1.0000 | ORAL_TABLET | Freq: Every day | ORAL | Status: DC
  Administered 2022-01-20 – 2022-01-22 (×2): 8.6 mg via ORAL
  Filled 2022-01-20 (×2): qty 1

## 2022-01-20 MED ORDER — ADULT MULTIVITAMIN W/MINERALS CH
1.0000 | ORAL_TABLET | Freq: Every day | ORAL | Status: DC
  Administered 2022-01-20 – 2022-01-21 (×2): 1 via ORAL
  Filled 2022-01-20 (×3): qty 1

## 2022-01-20 MED ORDER — MELOXICAM 7.5 MG PO TABS
15.0000 mg | ORAL_TABLET | Freq: Every day | ORAL | Status: DC
  Filled 2022-01-20 (×2): qty 2

## 2022-01-20 MED ORDER — MORPHINE SULFATE (PF) 2 MG/ML IV SOLN: 2.0000 mg | INTRAVENOUS | Status: DC | PRN

## 2022-01-20 MED ORDER — BISACODYL 10 MG RE SUPP
10.0000 mg | Freq: Every day | RECTAL | Status: DC | PRN
  Administered 2022-01-20: 10 mg via RECTAL
  Filled 2022-01-20: qty 1

## 2022-01-20 MED ORDER — HYDROCODONE-ACETAMINOPHEN 5-325 MG PO TABS: 1.0000 | ORAL_TABLET | ORAL | Status: DC | PRN

## 2022-01-20 MED ORDER — ASPIRIN 81 MG PO TBEC
81.0000 mg | DELAYED_RELEASE_TABLET | Freq: Every day | ORAL | Status: DC
  Administered 2022-01-20 – 2022-01-22 (×3): 81 mg via ORAL
  Filled 2022-01-20 (×4): qty 1

## 2022-01-20 MED ORDER — CLONAZEPAM 0.5 MG PO TABS
0.5000 mg | ORAL_TABLET | Freq: Two times a day (BID) | ORAL | Status: DC
  Administered 2022-01-20 – 2022-01-22 (×5): 0.5 mg via ORAL
  Filled 2022-01-20 (×5): qty 1

## 2022-01-20 MED ORDER — FLUTICASONE PROPIONATE 50 MCG/ACT NA SUSP: 1.0000 | Freq: Every day | NASAL | Status: DC | PRN

## 2022-01-20 MED ORDER — POLYETHYLENE GLYCOL 3350 17 G PO PACK
34.0000 g | PACK | Freq: Every day | ORAL | Status: DC
  Administered 2022-01-20: 34 g via ORAL
  Administered 2022-01-21: 17 g via ORAL
  Administered 2022-01-22: 34 g via ORAL
  Filled 2022-01-20 (×3): qty 2

## 2022-01-20 MED ORDER — VITAMIN C 500 MG PO TABS
500.0000 mg | ORAL_TABLET | Freq: Every day | ORAL | Status: DC
  Administered 2022-01-20 – 2022-01-22 (×3): 500 mg via ORAL
  Filled 2022-01-20 (×3): qty 1

## 2022-01-20 MED ORDER — ACETAMINOPHEN 650 MG RE SUPP: 650.0000 mg | Freq: Four times a day (QID) | RECTAL | Status: DC | PRN

## 2022-01-20 MED ORDER — FLUOXETINE HCL 20 MG PO CAPS
20.0000 mg | ORAL_CAPSULE | Freq: Every day | ORAL | Status: DC
  Administered 2022-01-20 – 2022-01-21 (×2): 20 mg via ORAL
  Filled 2022-01-20 (×2): qty 1

## 2022-01-20 MED ORDER — SODIUM CHLORIDE 0.9 % IV SOLN: INTRAVENOUS | Status: DC

## 2022-01-20 MED ORDER — SODIUM CHLORIDE 0.9 % IV SOLN
1.0000 g | INTRAVENOUS | Status: DC
  Administered 2022-01-20 – 2022-01-21 (×2): 1 g via INTRAVENOUS
  Filled 2022-01-20 (×3): qty 10

## 2022-01-20 MED ORDER — IOHEXOL 350 MG/ML SOLN
75.0000 mL | Freq: Once | INTRAVENOUS | Status: AC | PRN
  Administered 2022-01-20: 75 mL via INTRAVENOUS

## 2022-01-20 MED ORDER — LEVOTHYROXINE SODIUM 88 MCG PO TABS
88.0000 ug | ORAL_TABLET | Freq: Every day | ORAL | Status: DC
  Administered 2022-01-20 – 2022-01-22 (×3): 88 ug via ORAL
  Filled 2022-01-20 (×4): qty 1

## 2022-01-20 NOTE — Assessment & Plan Note (Signed)
Pt started xtandi and was taken off eliquis per patient.

## 2022-01-20 NOTE — Assessment & Plan Note (Signed)
Mild anemia with h./o anemia. Pt advised to stay off eliquis.  Type and screen.

## 2022-01-20 NOTE — ED Notes (Signed)
Report given to Hanna, RN

## 2022-01-20 NOTE — Assessment & Plan Note (Addendum)
Cont with iv abx therapy.  Follow c/s.  Urinalysis    Component Value Date/Time   COLORURINE YELLOW (A) 01/19/2022 2124   APPEARANCEUR HAZY (A) 01/19/2022 2124   APPEARANCEUR Clear 07/04/2016 0843   LABSPEC 1.006 01/19/2022 2124   PHURINE 7.0 01/19/2022 2124   GLUCOSEU NEGATIVE 01/19/2022 2124   HGBUR MODERATE (A) 01/19/2022 2124   BILIRUBINUR NEGATIVE 01/19/2022 2124   BILIRUBINUR Negative 07/15/2018 1002   BILIRUBINUR Negative 07/04/2016 [place] 01/19/2022 2124   PROTEINUR NEGATIVE 01/19/2022 2124   UROBILINOGEN 0.2 07/15/2018 1002   NITRITE POSITIVE (A) 01/19/2022 2124   LEUKOCYTESUR LARGE (A) 01/19/2022 2124

## 2022-01-20 NOTE — Progress Notes (Addendum)
Interim progress note not for billing.  Patient with a history of htn, dvt, cva, metastatic prostate cancer, recent hospitalization for emergent laminectomy to resect epidural met with resulting partial quadriplegia, had aspiration pneumonia that hospitalization and right parietal stroke, presenting with fever shortly after starting Xtandi anti-androgen therapy. He has no localizing symptoms. No neck or back pain to suggest spine infection. No decubitus ulcer or other skin changes. Workup reveals pyuria. Initial x-ray showed possible infiltrate but patient is without respiratory symptoms and CTA shows no PE, prior granulomatous disease. He is hemodynamically stable. He has had an indwelling Foley since his hospitalization in August and plan per patient/wife is trial of void in January. Discussed w/ Dr. Tasia Catchings who doesn't think xtandi is associated w/ this fever - advises holding this hospitalization and resuming at discharge. Will continue antibiotics. And will have nursing remove and replace patient's indwelling foley. Patient reports constipation since Saturday so will augment home bowel regimen with miralax and senna.

## 2022-01-20 NOTE — H&P (Signed)
History and Physical    Chief Complaint: Fever    HISTORY OF PRESENT ILLNESS: Jason [name] is an 66 y.o. male  fever and generalized weakness since today. Pt started xtandi today first dose. Pt was doing exercises in bed and then got up to his wheelchair then had lunch and got haircut and watching tv and fell asleep at 2 then got up . Very fatigued.  Then after he woke up went back to bed and ate supper and then neighbor came over and flossed his teeth.  7PM -Wife checked temp 101.2 took tylenol 100.8 Dr.Yus office and Dr.Rao said to go to er.   Pt has PMH as below: Past Medical History:  Diagnosis Date   Allergy    Arthritis    osteoarthritis   GERD (gastroesophageal reflux disease)    Hypertension    Hypothyroidism    Renal calculi    Thyroid disease    Hyperthyroidism s/p radioactive iodine ablation  Review of Systems  Constitutional:  Positive for chills, fatigue and fever.  Neurological:  Positive for weakness.  All other systems reviewed and are negative. No Known Allergies Past Surgical History:  Procedure Laterality Date   CARDIAC CATHETERIZATION  03/2000   HERNIA REPAIR  5009   umbilical   LEFT HEART CATH AND CORONARY ANGIOGRAPHY Left 03/20/2020   Procedure: LEFT HEART CATH AND CORONARY ANGIOGRAPHY;  Surgeon: Nelva Bush, MD;  Location: [facility];  Service: Cardiovascular;  Laterality: Left;   POSTERIOR CERVICAL FUSION/FORAMINOTOMY N/A 10/06/2021   Procedure: POSTERIOR CERVICAL FUSION/ FORAMINOTOMY LEVEL 3;  Surgeon: Meade Maw, MD;  Location: [facility];  Service: Neurosurgery;  Laterality: N/A;  will need monitoring   RADIOLOGY WITH ANESTHESIA Left 04/18/2021   Procedure: MRI SHOULDER WITHOUT CONTRAST WITH ANESTHESIA;  Surgeon: Radiologist, Medication, MD;  Location: [facility];  Service: Radiology;  Laterality: Left;   RADIOLOGY WITH ANESTHESIA N/A 10/03/2021   Procedure: MRI CERVICAL SPINE WITH ANESTHESIA;  Surgeon: Radiologist, Medication,  MD;  Location: [facility];  Service: Radiology;  Laterality: N/A;   REVERSE SHOULDER ARTHROPLASTY Left    ROTATOR CUFF REPAIR  11/2010   Dr [name] Heck   TONSILLECTOMY     TOTAL HIP ARTHROPLASTY Right 02/24/2009   TOTAL HIP ARTHROPLASTY Left 06/2016   Dr Harlow Mares   Social History   Socioeconomic History   Marital status: Married    Spouse name: [name]    Number of children: 0   Years of education: Not on file   Highest education level: Not on file  Occupational History   Occupation: Therapist, occupational: [employer]  Tobacco Use   Smoking status: Never    Passive exposure: Never   Smokeless tobacco: Never  Vaping Use   Vaping Use: Never used  Substance and Sexual Activity   Alcohol use: Not Currently    Comment: Alcohol once every few weeks/months   Drug use: No   Sexual activity: Yes  Other Topics Concern   Not on file  Social History Narrative   Lives at home with spouse.    Social Determinants of Health   Financial Resource Strain: Not on file  Food Insecurity: Not on file  Transportation Needs: Not on file  Physical Activity: Not on file  Stress: Not on file  Social Connections: Not on file      CURRENT MEDS:   Current Outpatient Medications (Endocrine & Metabolic):    levothyroxine (SYNTHROID) 88 MCG tablet, Take 1 tablet (88 mcg total) by mouth  daily.   Current Outpatient Medications (Cardiovascular):    atorvastatin (LIPITOR) 40 MG tablet, Take 1 tablet (40 mg total) by mouth daily.   rosuvastatin (CRESTOR) 5 MG tablet, Take 5 mg by mouth daily. (Patient not taking: Reported on 01/13/2022)   spironolactone (ALDACTONE) 25 MG tablet, Take 12.5 mg by mouth daily. (Patient not taking: Reported on 01/13/2022)   Current Outpatient Medications (Respiratory):    cetirizine (ZYRTEC) 10 MG tablet, Take 10 mg by mouth at bedtime.   fluticasone (FLONASE) 50 MCG/ACT nasal spray, Place 1 spray into both nostrils daily.   Current Outpatient Medications (Analgesics):     acetaminophen (TYLENOL) 325 MG tablet, Take 1-2 tablets (325-650 mg total) by mouth every 4 (four) hours as needed for mild pain.   aspirin EC 81 MG tablet, Take 1 tablet (81 mg total) by mouth daily. Swallow whole.   meloxicam (MOBIC) 15 MG tablet, Take 1 tablet (15 mg total) by mouth daily.   Current Outpatient Medications (Hematological):    iron polysaccharides (NIFEREX) 150 MG capsule, Take 150 mg by mouth daily.  Current Facility-Administered Medications (Other):    iohexol (OMNIPAQUE) 350 MG/ML injection 75 mL  Current Outpatient Medications (Other):    ascorbic acid (VITAMIN C) 500 MG tablet, Take 1 tablet (500 mg total) by mouth daily.   bisacodyl (DULCOLAX) 10 MG suppository, Place 1 suppository (10 mg total) rectally daily as needed for moderate constipation.   clonazePAM (KLONOPIN) 0.5 MG tablet, Take 1 tablet (0.5 mg total) by mouth 2 (two) times daily.   docusate sodium (COLACE) 100 MG capsule, Take 2 capsules (200 mg total) by mouth daily.   enzalutamide (XTANDI) 40 MG tablet, Take 4 tablets (160 mg total) by mouth daily.   famotidine (PEPCID) 20 MG tablet, Take 1 tablet (20 mg total) by mouth daily.   FLUoxetine (PROZAC) 20 MG capsule, Take 1 capsule (20 mg total) by mouth at bedtime.   Multiple Vitamin (MULTIVITAMIN) tablet, Take 1 tablet by mouth daily.   potassium chloride (KLOR-CON M) 10 MEQ tablet, Take 1 tablet (10 mEq total) by mouth at bedtime.   tamsulosin (FLOMAX) 0.4 MG CAPS capsule, Take 1 capsule (0.4 mg total) by mouth daily after supper.   ED Course: Pt in Ed for fever and is alert and awake and  febrile at 99.0. Vitals:   01/19/22 2319 01/20/22 0000 01/20/22 0107 01/20/22 0138  BP: 131/81 122/77 135/83   Pulse: 78 80 74   Resp: '18 18 18   '$ Temp: 98.9 F (37.2 C)  98.6 F (37 C)   TempSrc: Oral  Oral   SpO2: 95% 95% 96% 96%  Weight:      Height:       No intake/output data recorded. SpO2: 96 % Blood work in ed shows: mild anemia pt has had  transfusion in past,  Results for orders placed or performed during the hospital encounter of 01/19/22 (from the past 48 hour(s))  Lactic acid, plasma     Status: None   Collection Time: 01/19/22  9:16 PM  Result Value Ref Range   Lactic Acid, Venous 0.8 0.5 - 1.9 mmol/L    Comment: Performed at [facility], [address] [place], [place] [address]  Resp Panel by RT-PCR (Flu A&B, Covid) Anterior Nasal Swab     Status: None   Collection Time: 01/19/22  9:24 PM   Specimen: Anterior Nasal Swab  Result Value Ref Range   SARS Coronavirus 2 by RT PCR NEGATIVE NEGATIVE  Comment: (NOTE) SARS-CoV-2 target nucleic acids are NOT DETECTED.  The SARS-CoV-2 RNA is generally detectable in upper respiratory specimens during the acute phase of infection. The lowest concentration of SARS-CoV-2 viral copies this assay can detect is 138 copies/mL. A negative result does not preclude SARS-Cov-2 infection and should not be used as the sole basis for treatment or other patient management decisions. A negative result may occur with  improper specimen collection/handling, submission of specimen other than nasopharyngeal swab, presence of viral mutation(s) within the areas targeted by this assay, and inadequate number of viral copies(<138 copies/mL). A negative result must be combined with clinical observations, patient history, and epidemiological information. The expected result is Negative.  Fact Sheet for Patients:  EntrepreneurPulse.com.au  Fact Sheet for Healthcare Providers:  IncredibleEmployment.be  This test is no t yet approved or cleared by the [place] FDA and  has been authorized for detection and/or diagnosis of SARS-CoV-2 by FDA under an Emergency Use Authorization (EUA). This EUA will remain  in effect (meaning this test can be used) for the duration of the COVID-19 declaration under Section 564(b)(1) of the Act, 21 U.S.C.section  360bbb-3(b)(1), unless the authorization is terminated  or revoked sooner.       Influenza A by PCR NEGATIVE NEGATIVE   Influenza B by PCR NEGATIVE NEGATIVE    Comment: (NOTE) The Xpert Xpress SARS-CoV-2/FLU/RSV plus assay is intended as an aid in the diagnosis of influenza from Nasopharyngeal swab specimens and should not be used as a sole basis for treatment. Nasal washings and aspirates are unacceptable for Xpert Xpress SARS-CoV-2/FLU/RSV testing.  Fact Sheet for Patients: EntrepreneurPulse.com.au  Fact Sheet for Healthcare Providers: IncredibleEmployment.be  This test is not yet approved or cleared by the [place] FDA and has been authorized for detection and/or diagnosis of SARS-CoV-2 by FDA under an Emergency Use Authorization (EUA). This EUA will remain in effect (meaning this test can be used) for the duration of the COVID-19 declaration under Section 564(b)(1) of the Act, 21 U.S.C. section 360bbb-3(b)(1), unless the authorization is terminated or revoked.  Performed at [facility], [address] [place], [place] [address]   Urinalysis, Routine w reflex microscopic Anterior Nasal Swab     Status: Abnormal   Collection Time: 01/19/22  9:24 PM  Result Value Ref Range   Color, Urine YELLOW (A) YELLOW   APPearance HAZY (A) CLEAR   Specific Gravity, Urine 1.006 1.005 - 1.030   pH 7.0 5.0 - 8.0   Glucose, UA NEGATIVE NEGATIVE mg/dL   Hgb urine dipstick MODERATE (A) NEGATIVE   Bilirubin Urine NEGATIVE NEGATIVE   Ketones, ur NEGATIVE NEGATIVE mg/dL   Protein, ur NEGATIVE NEGATIVE mg/dL   Nitrite POSITIVE (A) NEGATIVE   Leukocytes,Ua LARGE (A) NEGATIVE   RBC / HPF 6-10 0 - 5 RBC/hpf   WBC, UA >50 (H) 0 - 5 WBC/hpf   Bacteria, UA MANY (A) NONE SEEN   Squamous Epithelial / LPF 0-5 0 - 5   WBC Clumps PRESENT    Mucus PRESENT     Comment: Performed at [facility], [address] [place], [place] [address]   CBC with Differential     Status: Abnormal   Collection Time: 01/19/22  9:24 PM  Result Value Ref Range   WBC 9.6 4.0 - 10.5 K/uL   RBC 4.27 4.22 - 5.81 MIL/uL   Hemoglobin 12.8 (L) 13.0 - 17.0 g/dL   HCT 38.2 (L) 39.0 - 52.0 %   MCV 89.5 80.0 -  100.0 fL   MCH 30.0 26.0 - 34.0 pg   MCHC 33.5 30.0 - 36.0 g/dL   RDW 14.7 11.5 - 15.5 %   Platelets 206 150 - 400 K/uL   nRBC 0.0 0.0 - 0.2 %   Neutrophils Relative % 82 %   Neutro Abs 7.9 (H) 1.7 - 7.7 K/uL   Lymphocytes Relative 7 %   Lymphs Abs 0.6 (L) 0.7 - 4.0 K/uL   Monocytes Relative 10 %   Monocytes Absolute 1.0 0.1 - 1.0 K/uL   Eosinophils Relative 0 %   Eosinophils Absolute 0.0 0.0 - 0.5 K/uL   Basophils Relative 1 %   Basophils Absolute 0.1 0.0 - 0.1 K/uL   Immature Granulocytes 0 %   Abs Immature Granulocytes 0.03 0.00 - 0.07 K/uL    Comment: Performed at [facility], [address] [place], [place] [address]  Comprehensive metabolic panel     Status: Abnormal   Collection Time: 01/19/22  9:24 PM  Result Value Ref Range   Sodium 135 135 - 145 mmol/L   Potassium 3.3 (L) 3.5 - 5.1 mmol/L   Chloride 102 98 - 111 mmol/L   CO2 24 22 - 32 mmol/L   Glucose, Bld 121 (H) 70 - 99 mg/dL    Comment: Glucose reference range applies only to samples taken after fasting for at least 8 hours.   BUN 13 8 - 23 mg/dL   Creatinine, Ser 0.35 (L) 0.61 - 1.24 mg/dL   Calcium 8.9 8.9 - 10.3 mg/dL   Total Protein 6.8 6.5 - 8.1 g/dL   Albumin 3.5 3.5 - 5.0 g/dL   AST 25 15 - 41 U/L   ALT 22 0 - 44 U/L   Alkaline Phosphatase 169 (H) 38 - 126 U/L   Total Bilirubin 1.2 0.3 - 1.2 mg/dL   GFR, Estimated >60 >60 mL/min    Comment: (NOTE) Calculated using the CKD-EPI Creatinine Equation (2021)    Anion gap 9 5 - 15    Comment: Performed at [facility], [address] [place], [place] [address]  Procalcitonin - Baseline     Status: None   Collection Time: 01/19/22  9:24 PM  Result Value Ref Range   Procalcitonin <0.10  ng/mL    Comment:        Interpretation: PCT (Procalcitonin) <= 0.5 ng/mL: Systemic infection (sepsis) is not likely. Local bacterial infection is possible. (NOTE)       Sepsis PCT Algorithm           Lower Respiratory Tract                                      Infection PCT Algorithm    ----------------------------     ----------------------------         PCT < 0.25 ng/mL                PCT < 0.10 ng/mL          Strongly encourage             Strongly discourage   discontinuation of antibiotics    initiation of antibiotics    ----------------------------     -----------------------------       PCT 0.25 - 0.50 ng/mL            PCT 0.10 - 0.25 ng/mL  OR       >80% decrease in PCT            Discourage initiation of                                            antibiotics      Encourage discontinuation           of antibiotics    ----------------------------     -----------------------------         PCT >= 0.50 ng/mL              PCT 0.26 - 0.50 ng/mL               AND        <80% decrease in PCT             Encourage initiation of                                             antibiotics       Encourage continuation           of antibiotics    ----------------------------     -----------------------------        PCT >= 0.50 ng/mL                  PCT > 0.50 ng/mL               AND         increase in PCT                  Strongly encourage                                      initiation of antibiotics    Strongly encourage escalation           of antibiotics                                     -----------------------------                                           PCT <= 0.25 ng/mL                                                 OR                                        > 80% decrease in PCT                                      Discontinue / Do not initiate  antibiotics  Performed at [facility], [address] [place], [place] [address]     In Ed pt received  Meds ordered this encounter  Medications   cefTRIAXone (ROCEPHIN) 2 g in sodium chloride 0.9 % 100 mL IVPB    Order Specific Question:   Antibiotic Indication:    Answer:   CAP   azithromycin (ZITHROMAX) 500 mg in sodium chloride 0.9 % 250 mL IVPB    Order Specific Question:   Antibiotic Indication:    Answer:   CAP   sodium chloride 0.9 % bolus 1,000 mL   iohexol (OMNIPAQUE) 350 MG/ML injection 75 mL    Unresulted Labs (From admission, onward)     Start     Ordered   01/19/22 2332  Blood culture (single)  ONCE - STAT,   STAT        01/19/22 2331   01/19/22 2332  Urine Culture  Add-on,   AD       Question:  Indication  Answer:  Dysuria   01/19/22 2331   01/19/22 2124  Blood culture (single)  Once,   STAT        01/19/22 2123   Pending  CBC  (enoxaparin (LOVENOX)    CrCl >/= 30 ml/min)  Once,   R       Comments: Baseline for enoxaparin therapy IF NOT ALREADY DRAWN.  Notify MD if PLT < 100 K.    Pending   Pending  Creatinine, serum  (enoxaparin (LOVENOX)    CrCl >/= 30 ml/min)  Once,   R       Comments: Baseline for enoxaparin therapy IF NOT ALREADY DRAWN.    Pending   Pending  Creatinine, serum  (enoxaparin (LOVENOX)    CrCl >/= 30 ml/min)  Weekly,   R     Comments: while on enoxaparin therapy    Pending   Pending  Culture, blood (routine x 2) Call MD if unable to obtain prior to antibiotics being given  (COPD / Pneumonia / Cellulitis / Lower Extremity Wound)  BLOOD CULTURE X 2,   R     Comments: If blood cultures drawn in Emergency Department - Do not draw and cancel order    Pending             Admission Imaging : DG Chest Port 1 View  Result Date: 01/19/2022 CLINICAL DATA:  [id number] Fever [id number] EXAM: PORTABLE CHEST 1 VIEW COMPARISON:  Chest x-ray 10/17/2021, PET CT 12/12/2021 FINDINGS: The heart and mediastinal contours are unchanged. Low lung volumes. Similar-appearing calcification overlying right lung and  better evaluated on PET-CT 12/12/2021. Interval development of a right apical airspace opacity. No pulmonary edema. No pleural effusion. No pneumothorax. Sclerotic appearance of the appendicular skeleton consistent with known metastases. Reverse total left shoulder arthroplasty. IMPRESSION: Interval development of a right apical airspace opacity. Finding may be due to positioning. Recommend repeat PA and lateral view of the chest. Electronically Signed   By: Iven Finn M.D.   On: 01/19/2022 22:04      Physical Examination: Vitals:   01/19/22 2319 01/20/22 0000 01/20/22 0107 01/20/22 0138  BP: 131/81 122/77 135/83   Pulse: 78 80 74   Temp: 98.9 F (37.2 C)  98.6 F (37 C)   Resp: '18 18 18   '$ Height:      Weight:      SpO2: 95% 95% 96% 96%  TempSrc: Oral  Oral   BMI (Calculated):  Physical Exam Vitals and nursing note reviewed.  Constitutional:      General: He is not in acute distress.    Appearance: Normal appearance. He is not ill-appearing, toxic-appearing or diaphoretic.  HENT:     Head: Normocephalic and atraumatic.     Right Ear: Hearing and external ear normal.     Left Ear: Hearing and external ear normal.     Nose: Nose normal. No nasal deformity.     Mouth/Throat:     Lips: Pink.     Mouth: Mucous membranes are moist.     Tongue: No lesions.     Pharynx: Oropharynx is clear.  Eyes:     Extraocular Movements: Extraocular movements intact.     Pupils: Pupils are equal, round, and reactive to light.  Neck:     Vascular: No carotid bruit.  Cardiovascular:     Rate and Rhythm: Normal rate and regular rhythm.     Pulses: Normal pulses.     Heart sounds: Normal heart sounds.  Pulmonary:     Effort: Pulmonary effort is normal.     Breath sounds: Normal breath sounds.  Abdominal:     General: Bowel sounds are normal. There is no distension.     Palpations: Abdomen is soft. There is no mass.     Tenderness: There is no abdominal tenderness. There is no  guarding.     Hernia: No hernia is present.  Musculoskeletal:     Right lower leg: No edema.     Left lower leg: No edema.  Skin:    General: Skin is warm.  Neurological:     General: No focal deficit present.     Mental Status: He is alert and oriented to person, place, and time.     Cranial Nerves: Cranial nerves 2-12 are intact.     Motor: Motor function is intact.  Psychiatric:        Attention and Perception: Attention normal.        Mood and Affect: Mood normal.        Speech: Speech normal.        Behavior: Behavior normal. Behavior is cooperative.        Cognition and Memory: Cognition normal.     Assessment and Plan: * Fever D/d include infectious vs noninfectious.  We will do cta chest and procalcitonin and continue plan with abx or not.  Chest xray shows: Rt apical airspace densities.   UTI (urinary tract infection) Cont with iv abx therapy.  Follow c/s.  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  Pt has weakness of both legs but can move them against gravity. Strength 3/5.can move both ue but has weakness in LUE.   Anemia Mild anemia with h./o anemia. Pt advised to stay off eliquis.  Type and screen.    Prostate cancer metastatic to multiple sites (  [place]) Pt started xtandi and was taken off eliquis per patient.   BPH (benign  prostatic hyperplasia) We will continue flomax.   GERD (gastroesophageal reflux disease) IV PPI therapy.  Hypertension Vitals:   01/19/22 2118 01/19/22 2319 01/20/22 0000 01/20/22 0107  BP: (!) 153/89 131/81 122/77 135/83  Continue patient on home regimen of Aldactone.   Hypothyroidism Cont levothyroxine at 88 mcg.     DVT prophylaxis:  Off Eliquis.    Code Status:  Full Code     Family Communication:  Reef, Achterberg (Spouse) [phone]     Disposition Plan:  Home    Consults called:  None   Admission status: Inpatient    Unit/ Expected LOS: Med surg / tele / 2-3 days.   Para Skeans MD [facility]  6 PM- 2 AM. Please contact me via secure Chat 6 PM-2 AM. [phone] ( Pager ) To contact the [facility] Attending or Consulting provider [place] or covering provider during after hours [place], for this patient.   Check the care team in [facility] and look for a) attending/consulting [facility] provider listed and b) the [facility] team listed Log into [email or web address] and use 's universal password to access. If you do not have the password, please contact the hospital operator. Locate the [facility] provider you are looking for under [facility] and page to a number that you can be directly reached. If you still have difficulty reaching the provider, please page the [facility] (Director on Call) for the Hospitalists listed on amion for assistance. [email or web address] 01/20/2022, 1:58 AM

## 2022-01-20 NOTE — Assessment & Plan Note (Addendum)
D/d include infectious vs noninfectious.  We will do cta chest and procalcitonin and continue plan with abx or not.  Chest xray shows: Rt apical airspace densities.

## 2022-01-20 NOTE — Assessment & Plan Note (Signed)
From his previous stroke.  Pt has weakness of both legs but can move them against gravity. Strength 3/5.can move both ue but has weakness in LUE.

## 2022-01-20 NOTE — Assessment & Plan Note (Signed)
Vitals:   01/19/22 2118 01/19/22 2319 01/20/22 0000 01/20/22 0107  BP: (!) 153/89 131/81 122/77 135/83  Continue patient on home regimen of Aldactone.

## 2022-01-20 NOTE — Assessment & Plan Note (Signed)
We will continue flomax.

## 2022-01-20 NOTE — Assessment & Plan Note (Signed)
IV PPI therapy.  

## 2022-01-20 NOTE — Assessment & Plan Note (Signed)
Cont levothyroxine at 88 mcg.

## 2022-01-21 ENCOUNTER — Encounter: Payer: Self-pay | Admitting: Internal Medicine

## 2022-01-21 DIAGNOSIS — N390 Urinary tract infection, site not specified: Secondary | ICD-10-CM

## 2022-01-21 DIAGNOSIS — T83511S Infection and inflammatory reaction due to indwelling urethral catheter, sequela: Secondary | ICD-10-CM

## 2022-01-21 MED ORDER — ENOXAPARIN SODIUM 40 MG/0.4ML IJ SOSY
40.0000 mg | PREFILLED_SYRINGE | INTRAMUSCULAR | Status: DC
  Administered 2022-01-21: 40 mg via SUBCUTANEOUS
  Filled 2022-01-21: qty 0.4

## 2022-01-21 MED ORDER — CHLORHEXIDINE GLUCONATE CLOTH 2 % EX PADS
6.0000 | MEDICATED_PAD | Freq: Every day | CUTANEOUS | Status: DC
  Administered 2022-01-21: 6 via TOPICAL

## 2022-01-21 MED ORDER — POTASSIUM CHLORIDE 20 MEQ PO PACK
40.0000 meq | PACK | Freq: Once | ORAL | Status: AC
  Administered 2022-01-21: 40 meq via ORAL
  Filled 2022-01-21: qty 2

## 2022-01-21 MED ORDER — BISACODYL 10 MG RE SUPP
10.0000 mg | Freq: Every day | RECTAL | Status: DC
  Filled 2022-01-21: qty 1

## 2022-01-21 NOTE — TOC Initial Note (Signed)
Transition of Care [facility]) - Initial/Assessment Note    Patient Details  Name: Jason [name] MRN: [id number] Date of Birth: September 06, 1955  Transition of Care [facility]) CM/SW Contact:    Laurena Slimmer, RN Phone Number: 01/21/2022, 10:44 AM  Clinical Narrative:                  Transition of Care [facility]) Screening Note   Patient Details  Name: Jason [name] Date of Birth: [birth year]   Transition of Care [facility]) CM/SW Contact:    Laurena Slimmer, RN Phone Number: 01/21/2022, 10:44 AM    Transition of Care Department (TOC) has reviewed patient and no TOC needs have been identified at this time. We will continue to monitor patient advancement through interdisciplinary progression rounds. If new patient transition needs arise, please place a TOC consult.          Patient Goals and CMS Choice        Expected Discharge Plan and Services                                                Prior Living Arrangements/Services                       Activities of Daily Living      Permission Sought/Granted                  Emotional Assessment              Admission diagnosis:  Fever [R50.9] Acute UTI [N39.0] Generalized weakness [R53.1] Pneumonia of right upper lobe due to infectious organism [J18.9] Patient Active Problem List   Diagnosis Date Noted   Fever 01/20/2022   UTI (urinary tract infection) 01/20/2022   Androgen deprivation therapy 01/13/2022   Neurogenic bladder 12/04/2021   History of deep vein thrombosis (DVT) of lower extremity 12/04/2021   Chronic incomplete quadriplegia ([place]) 10/18/2021   Anemia    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) 10/12/2021   Prostate cancer metastatic to multiple sites [facility])    Hypotension and abnormal strength/sensation  10/07/2021   Cervical spinal stenosis  10/05/2021   Hypertensive urgency 10/05/2021   PSVT (paroxysmal supraventricular tachycardia) 01/25/2021   NSVT (nonsustained ventricular tachycardia) ([place]) 01/25/2021   Hyperlipidemia 01/25/2021   PVC's (premature ventricular contractions) 03/15/2020   BPH (benign prostatic hyperplasia) 11/07/2013   Routine general medical examination at a health care facility 06/24/2010   Hypothyroidism 06/19/2009   OSTEOARTHRITIS 12/19/2008   BACK PAIN, LUMBAR 02/26/2007   Hypertension 09/17/2006   ALLERGIC RHINITIS 09/17/2006   GERD (gastroesophageal reflux disease) 09/17/2006   PCP:  Venia Carbon, MD Pharmacy:   [facility] [place], [place] [address] [place] [address] Phone: [phone] Fax: [phone]  [place], [place]. [place] [address] Phone: [phone] Fax: [phone]  [place] [address], [place] [address] Phone: [phone] Fax: [phone]  [facility] [place], [place] [name] [place] [address] Phone: [phone] Fax: [phone]  [facility] [address]  [place] [address] Phone: [phone] Fax: [phone]     Social Determinants of Health (SDOH) Interventions    Readmission Risk Interventions     No data to display

## 2022-01-21 NOTE — Progress Notes (Signed)
PROGRESS NOTE    Jason [name]  KYH:[id number] DOB: 24-Nov-1955 DOA: 01/19/2022  PCP: Venia Carbon, MD   Brief Narrative:  This 66 yrs old Male with PMH significant for hypertension, DVT, CVA, metastatic prostate cancer, recent hospitalization for emergent laminectomy to resect epidural mets with resulting partial quadriplegia, has developed aspiration pneumonia during that hospitalization and right parietal stroke now presented in the ED with fever shortly after starting Xtandi anti-androgen therapy.  Work-up reveals pyuria.  Initial chest x-ray showed possible infiltrate but patient without respiratory symptoms and CTA ruled out PE but shows prior granulomatous disease. He has indwelling Foley catheter since his last hospitalization in August and plan per wife is voiding trial in January.  Dr. Tasia Catchings does not think Jason [name] is associated with fever, advises holding this during hospitalization and resume at discharge.  Patient is found to have UTI started on IV antibiotics.  He is admitted for further evaluation.  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 Initiated on IV ceftriaxone.  Follow-up urine cultures. Likely secondary to chronic indwelling Foley catheter.  Fever:  Likely secondary to UTI. CTA chest ruled out PE, pneumonia. Lactic acid 0.8, procalcitonin 0.10.  No leukocytosis.  No signs of sepsis. Patient remained afebrile since hospitalized.  Hypokalemia: Replaced.  Continue to  monitor  Chronic incomplete quadriplegia: Likely from prior stroke. Patient has weakness on both legs but can move them against gravity. Appears at his baseline.  Prostate cancer metastasized to multiple sites: Patient was recently started on Xtandi and was taken off Eliquis per patient.  BPH: Continue Flomax 0.4 mg daily  GERD:  Continue Pantoprazole 40 mg daily  Hypothyroidism: Continue levothyroxine 88 mcg daily.  Essential hypertension: Continue Aldactone.  DVT prophylaxis: SCDs Code Status: Full code Family Communication: Wife at bed side Disposition Plan:   Status is: Observation The patient will require care spanning > 2 midnights and should be moved to inpatient because: Admitted for fever secondary to UTI in the setting of chronic indwelling Foley catheter.  Follow-up urine cultures.  Anticipated discharge home 01/22/2022.   Consultants:  None  Procedures: None  Antimicrobials:  Anti-infectives (From admission, onward)    Start     Dose/Rate Route Frequency Ordered Stop   01/20/22 2200  cefTRIAXone (ROCEPHIN) 1 g in sodium chloride 0.9 % 100 mL IVPB        1 g 200 mL/hr over 30 Minutes Intravenous Every 24 hours 01/20/22 0820     01/19/22 2345  cefTRIAXone (ROCEPHIN) 2 g in sodium chloride 0.9 % 100 mL IVPB        2 g 200 mL/hr over 30 Minutes Intravenous  Once 01/19/22 2332 01/20/22 0138   01/19/22 2345  azithromycin (ZITHROMAX) 500 mg in sodium chloride 0.9 % 250 mL IVPB        500 mg 250 mL/hr over 60 Minutes Intravenous  Once 01/19/22 2332 01/20/22 0157       Subjective: Patient was seen and examined at bedside.  Overnight events  noted. Patient reports doing much better, states burning urination has improved.   He denies any fever since he is in the hospital.  Objective: Vitals:   01/21/22 0035 01/21/22 0251 01/21/22 0513 01/21/22 0900  BP: 124/74  126/72 139/78  Pulse: 87  84 72  Resp: '18  20 18  '$ Temp: 98.9 F (37.2 C)  99.3 F (37.4 C)  97.9 F (36.6 C)  TempSrc:    Oral  SpO2: 93%  96% 94%  Weight:  81.4 kg    Height:        Intake/Output Summary (Last 24 hours) at 01/21/2022 1134 Last data filed at 01/21/2022 0718 Gross per 24 hour  Intake 100 ml  Output 1300 ml  Net -1200 ml   Filed Weights   01/19/22 2118 01/21/22 0251  Weight: 85.7 kg 81.4 kg    Examination:  General exam: Appears comfortable, not in any acute distress.  Deconditioned Respiratory system: CTA bilaterally, respiratory effort normal, RR 15. Cardiovascular system: S1 & S2 heard, regular rate and rhythm, no murmur. Gastrointestinal system: Abdomen is soft, non tender, non distended, BS+ Central nervous system: Alert and oriented x 3. No focal neurological deficits. Extremities: No edema, no cyanosis, no clubbing. Skin: No rashes, lesions or ulcers Psychiatry: Judgement and insight appear normal. Mood & affect appropriate.     Data Reviewed: I have personally reviewed following labs and imaging studies  CBC: Recent Labs  Lab 01/19/22 2124 01/20/22 0333  WBC 9.6 6.8  NEUTROABS 7.9*  --   HGB 12.8* 11.1*  HCT 38.2* 34.0*  MCV 89.5 90.7  PLT 206 712   Basic Metabolic Panel: Recent Labs  Lab 01/19/22 2124 01/20/22 0333  NA 135 139  K 3.3* 3.2*  CL 102 107  CO2 24 24  GLUCOSE 121* 103*  BUN 13 14  CREATININE 0.35* 0.43*  CALCIUM 8.9 8.7*  MG  --  2.0   GFR: Estimated Creatinine Clearance: 93.3 mL/min (A) (by C-G formula based on SCr of 0.43 mg/dL (L)). Liver Function Tests: Recent Labs  Lab 01/19/22 2124 01/20/22 0333  AST 25 21  ALT 22 18  ALKPHOS 169* 135*  BILITOT 1.2 1.0  PROT 6.8 5.7*  ALBUMIN 3.5 3.0*   No results for input(s): "LIPASE", "AMYLASE" in the last 168 hours. No results for input(s): "AMMONIA" in the last 168 hours. Coagulation Profile: No results for input(s): "INR", "PROTIME" in the last 168 hours. Cardiac Enzymes: No results for input(s): "CKTOTAL", "CKMB", "CKMBINDEX", "TROPONINI" in the  last 168 hours. BNP (last 3 results) No results for input(s): "PROBNP" in the last 8760 hours. HbA1C: No results for input(s): "HGBA1C" in the last 72 hours. CBG: No results for input(s): "GLUCAP" in the last 168 hours. Lipid Profile: No results for input(s): "CHOL", "HDL", "LDLCALC", "TRIG", "CHOLHDL", "LDLDIRECT" in the last 72 hours. Thyroid Function Tests: No results for input(s): "TSH", "T4TOTAL", "FREET4", "T3FREE", "THYROIDAB" in the last 72 hours. Anemia Panel: No results for input(s): "VITAMINB12", "FOLATE", "FERRITIN", "TIBC", "IRON", "RETICCTPCT" in the last 72 hours. Sepsis Labs: Recent Labs  Lab 01/19/22 2116 01/19/22 2124  PROCALCITON  --  <0.10  LATICACIDVEN 0.8  --     Recent Results (from the past 240 hour(s))  Urine Culture     Status: Abnormal (Preliminary result)   Collection Time: 01/19/22  9:16 PM   Specimen: Urine, Catheterized  Result Value Ref Range Status   Specimen Description   Final    URINE, CATHETERIZED Performed at [facility],  [place], [place] [address]    Special Requests   Final    NONE Performed at [facility], [address] [place], [place] [address]    Culture (A)  Final    >=100,000 COLONIES/mL Lonell Grandchild NEGATIVE RODS SUSCEPTIBILITIES TO FOLLOW Performed at [facility], [place] [address], [place], [place] [address]    Report Status PENDING  Incomplete  Resp Panel by RT-PCR (Flu A&B, Covid) Anterior Nasal Swab     Status: None   Collection Time: 01/19/22  9:24 PM   Specimen: Anterior Nasal Swab  Result Value Ref Range Status   SARS Coronavirus 2 by RT PCR NEGATIVE NEGATIVE Final    Comment: (NOTE) SARS-CoV-2 target nucleic acids are NOT DETECTED.  The SARS-CoV-2 RNA is generally detectable in upper respiratory specimens during the acute phase of infection. The lowest concentration of SARS-CoV-2 viral copies this assay can detect is 138 copies/mL. A negative result does not preclude  SARS-Cov-2 infection and should not be used as the sole basis for treatment or other patient management decisions. A negative result may occur with  improper specimen collection/handling, submission of specimen other than nasopharyngeal swab, presence of viral mutation(s) within the areas targeted by this assay, and inadequate number of viral copies(<138 copies/mL). A negative result must be combined with clinical observations, patient history, and epidemiological information. The expected result is Negative.  Fact Sheet for Patients:  EntrepreneurPulse.com.au  Fact Sheet for Healthcare Providers:  IncredibleEmployment.be  This test is no t yet approved or cleared by the [place] FDA and  has been authorized for detection and/or diagnosis of SARS-CoV-2 by FDA under an Emergency Use Authorization (EUA). This EUA will remain  in effect (meaning this test can be used) for the duration of the COVID-19 declaration under Section 564(b)(1) of the Act, 21 U.S.C.section 360bbb-3(b)(1), unless the authorization is terminated  or revoked sooner.       Influenza A by PCR NEGATIVE NEGATIVE Final   Influenza B by PCR NEGATIVE NEGATIVE Final    Comment: (NOTE) The Xpert Xpress SARS-CoV-2/FLU/RSV plus assay is intended as an aid in the diagnosis of influenza from Nasopharyngeal swab specimens and should not be used as a sole basis for treatment. Nasal washings and aspirates are unacceptable for Xpert Xpress SARS-CoV-2/FLU/RSV testing.  Fact Sheet for Patients: EntrepreneurPulse.com.au  Fact Sheet for Healthcare Providers: IncredibleEmployment.be  This test is not yet approved or cleared by the [place] FDA and has been authorized for detection and/or diagnosis of SARS-CoV-2 by FDA under an Emergency Use Authorization (EUA). This EUA will remain in effect (meaning this test can be used) for the duration of  the COVID-19 declaration under Section 564(b)(1) of the Act, 21 U.S.C. section 360bbb-3(b)(1), unless the authorization is terminated or revoked.  Performed at [facility], [address] [place], [place] [address]   Blood culture (single)     Status: None (Preliminary result)   Collection Time: 01/19/22  9:24 PM   Specimen: BLOOD  Result Value Ref Range Status   Specimen Description   Final    BLOOD Blood Culture results may not be optimal due to an excessive volume of blood received in culture bottles   Special Requests   Final    BOTTLES DRAWN AEROBIC AND ANAEROBIC BLOOD RIGHT ARM   Culture   Final    NO GROWTH 2 DAYS Performed at [facility], [address] [place], [place] [address]    Report Status PENDING  Incomplete  Blood culture (single)     Status: None (Preliminary result)   Collection Time: 01/20/22 12:23 AM   Specimen: BLOOD  Result Value Ref Range Status   Specimen Description BLOOD BLOOD LEFT ARM  Final   Special Requests   Final    BOTTLES DRAWN AEROBIC AND ANAEROBIC Blood Culture results may not be optimal due to an excessive volume of blood received in culture bottles   Culture   Final    NO GROWTH 1 DAY Performed at [facility], [address], [place], [place] [address]    Report Status PENDING  Incomplete    Radiology Studies: CT Angio Chest Pulmonary Embolism (PE) W or WO Contrast  Result Date: 01/20/2022 CLINICAL DATA:  History of prostate carcinoma with fevers, initial encounter EXAM: CT ANGIOGRAPHY CHEST WITH CONTRAST TECHNIQUE: Multidetector CT imaging of the chest was performed using the standard protocol during bolus administration of intravenous contrast. Multiplanar CT image reconstructions and MIPs were obtained to evaluate the vascular anatomy. RADIATION DOSE REDUCTION: This exam was performed according to the departmental dose-optimization program which includes automated exposure control, adjustment of the mA  and/or kV according to patient size and/or use of iterative reconstruction technique. CONTRAST: 28m OMNIPAQUE IOHEXOL 350 MG/ML SOLN COMPARISON:  Chest x-ray from the previous day. FINDINGS: Cardiovascular: Thoracic aorta and its branches demonstrate atherosclerotic calcification. No aneurysmal dilatation or dissection is seen. Coronary calcifications are noted. Pulmonary artery shows a normal branching pattern without filling defect to suggest pulmonary embolism. Mediastinum/Nodes: Thoracic inlet is within normal limits. No hilar or mediastinal adenopathy is noted. Few calcified hilar lymph nodes are noted consistent with prior granulomatous disease. The esophagus is within normal limits. Lungs/Pleura: Mild emphysematous changes are noted. The lungs are well aerated with scattered calcified granulomas consistent with prior granulomatous disease. Mild right basilar atelectasis is noted Upper Abdomen: Visualized upper abdomen is unremarkable. Few scattered splenic granulomas are noted. Musculoskeletal: Diffuse sclerotic metastatic foci are noted throughout the bony skeleton. No acute abnormality noted. Review of the MIP images confirms the above findings. IMPRESSION: Changes consistent with diffuse prostate bony metastatic disease. No evidence of pulmonary emboli. Changes of prior granulomatous disease. Aortic Atherosclerosis (ICD10-I70.0) and Emphysema (ICD10-J43.9). Electronically Signed   By: MInez CatalinaM.D.   On: 01/20/2022 02:27   DG Chest Port 1 View  Result Date: 01/19/2022 CLINICAL DATA:  [id number]: PORTABLE CHEST 1 VIEW COMPARISON:  Chest x-ray 10/17/2021, PET CT 12/12/2021 FINDINGS: The heart and mediastinal contours are unchanged. Low lung volumes. Similar-appearing calcification overlying right lung and better evaluated on PET-CT 12/12/2021. Interval development of a right apical airspace opacity. No pulmonary edema. No pleural effusion. No pneumothorax. Sclerotic appearance of the  appendicular skeleton consistent with known metastases. Reverse total left shoulder arthroplasty. IMPRESSION: Interval development of a right apical airspace opacity. Finding may be due to positioning. Recommend repeat PA and lateral view of the chest. Electronically Signed   By: MIven FinnM.D.   On: 01/19/2022 22:04     Scheduled Meds:  ascorbic acid  500 mg Oral Daily   aspirin EC  81 mg Oral Daily   atorvastatin  40 mg Oral Daily   Chlorhexidine Gluconate Cloth  6 each Topical Daily   clonazePAM  0.5 mg Oral BID   docusate sodium  200 mg Oral Daily   famotidine  20 mg Oral Daily   FLUoxetine  20 mg Oral QHS   levothyroxine  88 mcg Oral Q0600   multivitamin  with minerals  1 tablet Oral Daily   polyethylene glycol  34 g Oral Daily   senna  1 tablet Oral Daily   sodium chloride flush  3 mL Intravenous Q12H   tamsulosin  0.4 mg Oral QPC supper   Continuous Infusions:  cefTRIAXone (ROCEPHIN)  IV Stopped (01/20/22 2241)     LOS: 0 days    Time spent: 50 mins    Melenie Minniear, MD [facility]   If 7PM-7AM, please contact night-coverage

## 2022-01-22 DIAGNOSIS — Z791 Long term (current) use of non-steroidal anti-inflammatories (NSAID): Secondary | ICD-10-CM | POA: Diagnosis not present

## 2022-01-22 DIAGNOSIS — Z86718 Personal history of other venous thrombosis and embolism: Secondary | ICD-10-CM | POA: Diagnosis not present

## 2022-01-22 DIAGNOSIS — I10 Essential (primary) hypertension: Secondary | ICD-10-CM | POA: Diagnosis present

## 2022-01-22 DIAGNOSIS — I69365 Other paralytic syndrome following cerebral infarction, bilateral: Secondary | ICD-10-CM | POA: Diagnosis not present

## 2022-01-22 DIAGNOSIS — Z96643 Presence of artificial hip joint, bilateral: Secondary | ICD-10-CM | POA: Diagnosis present

## 2022-01-22 DIAGNOSIS — C7949 Secondary malignant neoplasm of other parts of nervous system: Secondary | ICD-10-CM | POA: Diagnosis present

## 2022-01-22 DIAGNOSIS — Z96612 Presence of left artificial shoulder joint: Secondary | ICD-10-CM | POA: Diagnosis present

## 2022-01-22 DIAGNOSIS — D649 Anemia, unspecified: Secondary | ICD-10-CM | POA: Diagnosis present

## 2022-01-22 DIAGNOSIS — N400 Enlarged prostate without lower urinary tract symptoms: Secondary | ICD-10-CM | POA: Diagnosis present

## 2022-01-22 DIAGNOSIS — N319 Neuromuscular dysfunction of bladder, unspecified: Secondary | ICD-10-CM | POA: Diagnosis present

## 2022-01-22 DIAGNOSIS — Z8701 Personal history of pneumonia (recurrent): Secondary | ICD-10-CM | POA: Diagnosis not present

## 2022-01-22 DIAGNOSIS — C61 Malignant neoplasm of prostate: Secondary | ICD-10-CM | POA: Diagnosis present

## 2022-01-22 DIAGNOSIS — D71 Functional disorders of polymorphonuclear neutrophils: Secondary | ICD-10-CM | POA: Diagnosis present

## 2022-01-22 DIAGNOSIS — R509 Fever, unspecified: Secondary | ICD-10-CM | POA: Diagnosis present

## 2022-01-22 DIAGNOSIS — Z20822 Contact with and (suspected) exposure to covid-19: Secondary | ICD-10-CM | POA: Diagnosis present

## 2022-01-22 DIAGNOSIS — N390 Urinary tract infection, site not specified: Secondary | ICD-10-CM | POA: Diagnosis present

## 2022-01-22 DIAGNOSIS — G8250 Quadriplegia, unspecified: Secondary | ICD-10-CM | POA: Diagnosis present

## 2022-01-22 DIAGNOSIS — Z79899 Other long term (current) drug therapy: Secondary | ICD-10-CM | POA: Diagnosis not present

## 2022-01-22 DIAGNOSIS — Z8673 Personal history of transient ischemic attack (TIA), and cerebral infarction without residual deficits: Secondary | ICD-10-CM | POA: Diagnosis not present

## 2022-01-22 DIAGNOSIS — E039 Hypothyroidism, unspecified: Secondary | ICD-10-CM | POA: Diagnosis present

## 2022-01-22 DIAGNOSIS — Z7982 Long term (current) use of aspirin: Secondary | ICD-10-CM | POA: Diagnosis not present

## 2022-01-22 DIAGNOSIS — K219 Gastro-esophageal reflux disease without esophagitis: Secondary | ICD-10-CM | POA: Diagnosis present

## 2022-01-22 DIAGNOSIS — Y846 Urinary catheterization as the cause of abnormal reaction of the patient, or of later complication, without mention of misadventure at the time of the procedure: Secondary | ICD-10-CM | POA: Diagnosis present

## 2022-01-22 DIAGNOSIS — E876 Hypokalemia: Secondary | ICD-10-CM | POA: Diagnosis present

## 2022-01-22 DIAGNOSIS — Z981 Arthrodesis status: Secondary | ICD-10-CM | POA: Diagnosis not present

## 2022-01-22 DIAGNOSIS — T83511A Infection and inflammatory reaction due to indwelling urethral catheter, initial encounter: Secondary | ICD-10-CM | POA: Diagnosis present

## 2022-01-22 DIAGNOSIS — B961 Klebsiella pneumoniae [K. pneumoniae] as the cause of diseases classified elsewhere: Secondary | ICD-10-CM | POA: Diagnosis present

## 2022-01-22 DIAGNOSIS — Z79890 Hormone replacement therapy (postmenopausal): Secondary | ICD-10-CM | POA: Diagnosis not present

## 2022-01-22 LAB — CBC
HCT: 34.4 % — ABNORMAL LOW (ref 39.0–52.0)
Hemoglobin: 11.1 g/dL — ABNORMAL LOW (ref 13.0–17.0)
MCH: 29.5 pg (ref 26.0–34.0)
MCHC: 32.3 g/dL (ref 30.0–36.0)
MCV: 91.5 fL (ref 80.0–100.0)
Platelets: 179 10*3/uL (ref 150–400)
RBC: 3.76 MIL/uL — ABNORMAL LOW (ref 4.22–5.81)
RDW: 14.6 % (ref 11.5–15.5)
WBC: 5.5 10*3/uL (ref 4.0–10.5)
nRBC: 0 % (ref 0.0–0.2)

## 2022-01-22 LAB — BASIC METABOLIC PANEL
Anion gap: 6 (ref 5–15)
BUN: 16 mg/dL (ref 8–23)
CO2: 20 mmol/L — ABNORMAL LOW (ref 22–32)
Calcium: 8.6 mg/dL — ABNORMAL LOW (ref 8.9–10.3)
Chloride: 109 mmol/L (ref 98–111)
Creatinine, Ser: 0.39 mg/dL — ABNORMAL LOW (ref 0.61–1.24)
GFR, Estimated: >60 mL/min (ref >60)
Glucose, Bld: 98 mg/dL (ref 70–99)
Potassium: 3.7 mmol/L (ref 3.5–5.1)
Sodium: 135 mmol/L (ref 135–145)

## 2022-01-22 LAB — URINE CULTURE: Culture: >=100000 — AB

## 2022-01-22 MED ORDER — CEPHALEXIN 500 MG PO CAPS: 500.0000 mg | ORAL_CAPSULE | Freq: Four times a day (QID) | ORAL | 0 refills | Status: AC

## 2022-01-22 NOTE — Discharge Instructions (Signed)
Advised to follow up PCP in one week. Advised to take keflex 500 qid for 5 days for Klebsiella UTI. Advised to follow-up with urology as scheduled.

## 2022-01-22 NOTE — Discharge Summary (Signed)
Physician Discharge Summary  KEMPER HEUPEL TFT:[id number] DOB: Aug 25, 1955 DOA: 01/19/2022  PCP: Venia Carbon, MD  Admit date: 01/19/2022  Discharge date: 01/22/2022  Admitted From: Home. Disposition:  Home  Recommendations for Outpatient Follow-up:  Follow up with PCP in 1-2 weeks. Please obtain BMP/CBC in one week. Advised to take keflex 500 qid for 5 days for Klebsiella UTI. Advised to follow-up with urology as scheduled.  Home Health:Home Health services Equipment/Devices: Chronic indwelling foley catheter  Discharge Condition: Stable CODE STATUS:Full code Diet recommendation: Heart Healthy   Brief Summary/ Hospital Course: This 66 yrs old Male with PMH significant for hypertension, DVT, CVA, metastatic prostate cancer, recent hospitalization for emergent laminectomy to resect epidural mets with resulting partial quadriplegia, has developed aspiration pneumonia during that hospitalization and right parietal stroke now presented in the ED with fever shortly after starting Xtandi anti-androgen therapy.  Work-up reveals pyuria.  Initial chest x-ray showed possible infiltrate but patient without respiratory symptoms and CTA ruled out PE but shows prior granulomatous disease. He has indwelling Foley catheter since his last hospitalization in August and plan per wife is voiding trial in January.  Dr. Tasia Catchings does not think Gillermina Phy is associated with fever, advises holding this during hospitalization and resume at discharge.  Patient is found to have UTI started on IV antibiotics.  He is admitted for further evaluation.  He was continued on supportive care, IV hydration.  Urine cultures grew 100K Klebsiella pneumoniae pan-sensitive.  Patient feels better and wants to be discharged.  Patient is being discharged home on Keflex 4 times daily for next 4 days.  Advised follow-up with PCP.  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 Initiated on IV ceftriaxone.  Urine culture grew 100 K Klebsiella Likely secondary to chronic indwelling Foley catheter. Patient is being discharged home on Keflex 4 times daily for next 5 days.   Fever:  Likely secondary to UTI. CTA chest ruled out PE, pneumonia. Lactic acid 0.8, procalcitonin 0.10.  No leukocytosis.  No signs of sepsis. Patient remained afebrile since hospitalized.   Hypokalemia: Replaced.  Resolved.   Chronic incomplete quadriplegia: Likely from prior stroke. Patient has weakness on both legs but can move them against gravity. Appears at his baseline.   Prostate cancer metastasized to multiple sites: Patient was recently started on Xtandi and was taken off Eliquis per patient.   BPH: Continue Flomax 0.4 mg daily   GERD:  Continue Pantoprazole 40 mg daily   Hypothyroidism: Continue levothyroxine 88 mcg daily.   Essential hypertension: Continue Aldactone.  Discharge Instructions  Discharge Instructions     Call MD for:  difficulty breathing, headache or visual disturbances   Complete by: As directed    Call MD for:  persistant dizziness or light-headedness   Complete by: As directed    Call MD for:  persistant nausea and vomiting   Complete by: As directed    Diet - low sodium heart healthy   Complete by: As directed    Diet -  low sodium heart healthy   Complete by: As directed    Diet Carb Modified   Complete by: As directed    Discharge instructions   Complete by: As directed    Advised to follow up PCP in  one week. Advised to take keflex 500 qid for 5 days for Klebsiella UTI. Advised to follow-up with urology as scheduled.   Increase activity slowly   Complete by: As directed    Increase activity slowly   Complete by: As directed       Allergies as of 01/22/2022   No Known Allergies      Medication List     STOP taking these medications    rosuvastatin 5 MG tablet Commonly known as: CRESTOR   spironolactone 25 MG tablet Commonly known as: ALDACTONE       TAKE these medications    acetaminophen 325 MG tablet Commonly known as: TYLENOL Take 1-2 tablets (325-650 mg total) by mouth every 4 (four) hours as needed for mild pain.   ascorbic acid 500 MG tablet Commonly known as: VITAMIN C Take 1 tablet (500 mg total) by mouth daily.   aspirin EC 81 MG tablet Take 1 tablet (81 mg total) by mouth daily. Swallow whole. What changed: how much to take   atorvastatin 40 MG tablet Commonly known as: LIPITOR Take 1 tablet (40 mg total) by mouth daily.   bisacodyl 10 MG suppository Commonly known as: DULCOLAX Place 1 suppository (10 mg total) rectally daily as needed for moderate constipation.   cephALEXin 500 MG capsule Commonly known as: KEFLEX Take 1 capsule (500 mg total) by mouth 4 (four) times daily for 5 days.   cetirizine 10 MG tablet Commonly known as: ZYRTEC Take 10 mg by mouth at bedtime.   clonazePAM 0.5 MG tablet Commonly known as: KLONOPIN Take 1 tablet (0.5 mg total) by mouth 2 (two) times daily.   docusate sodium 100 MG capsule Commonly known as: COLACE Take 2 capsules (200 mg total) by mouth daily.   enzalutamide 40 MG tablet Commonly known as: Xtandi Take 4 tablets (160 mg total) by mouth daily.   famotidine 20 MG tablet Commonly known as: PEPCID Take 1 tablet (20 mg total) by mouth daily.   FLUoxetine 20 MG capsule Commonly known as: PROZAC Take 1 capsule (20 mg total) by mouth at bedtime.   fluticasone 50 MCG/ACT nasal spray Commonly  known as: FLONASE Place 1 spray into both nostrils daily.   iron polysaccharides 150 MG capsule Commonly known as: NIFEREX Take 150 mg by mouth daily.   levothyroxine 88 MCG tablet Commonly known as: SYNTHROID Take 1 tablet (88 mcg total) by mouth daily.   meloxicam 15 MG tablet Commonly known as: MOBIC Take 1 tablet (15 mg total) by mouth daily.   multivitamin tablet Take 1 tablet by mouth daily.   potassium chloride 10 MEQ tablet Commonly known as: KLOR-CON M Take 1 tablet (10 mEq total) by mouth at bedtime.   tamsulosin 0.4 MG Caps capsule Commonly known as: FLOMAX Take 1 capsule (0.4 mg total) by mouth daily after supper.        Follow-up Information     Venia Carbon, MD. Go on 01/28/2022.   Specialties: Internal Medicine, Pediatrics Why: @ 10:45 Contact information: [place] [address] [phone]                No Known Allergies  Consultations: None   Procedures/Studies: CT Angio Chest Pulmonary  Embolism (PE) W or WO Contrast  Result Date: 01/20/2022 CLINICAL DATA:  History of prostate carcinoma with fevers, initial encounter EXAM: CT ANGIOGRAPHY CHEST WITH CONTRAST TECHNIQUE: Multidetector CT imaging of the chest was performed using the standard protocol during bolus administration of intravenous contrast. Multiplanar CT image reconstructions and MIPs were obtained to evaluate the vascular anatomy. RADIATION DOSE REDUCTION: This exam was performed according to the departmental dose-optimization program which includes automated exposure control, adjustment of the mA and/or kV according to patient size and/or use of iterative reconstruction technique. CONTRAST: 66m OMNIPAQUE IOHEXOL 350 MG/ML SOLN COMPARISON:  Chest x-ray from the previous day. FINDINGS: Cardiovascular: Thoracic aorta and its branches demonstrate atherosclerotic calcification. No aneurysmal dilatation or dissection is seen. Coronary calcifications are  noted. Pulmonary artery shows a normal branching pattern without filling defect to suggest pulmonary embolism. Mediastinum/Nodes: Thoracic inlet is within normal limits. No hilar or mediastinal adenopathy is noted. Few calcified hilar lymph nodes are noted consistent with prior granulomatous disease. The esophagus is within normal limits. Lungs/Pleura: Mild emphysematous changes are noted. The lungs are well aerated with scattered calcified granulomas consistent with prior granulomatous disease. Mild right basilar atelectasis is noted Upper Abdomen: Visualized upper abdomen is unremarkable. Few scattered splenic granulomas are noted. Musculoskeletal: Diffuse sclerotic metastatic foci are noted throughout the bony skeleton. No acute abnormality noted. Review of the MIP images confirms the above findings. IMPRESSION: Changes consistent with diffuse prostate bony metastatic disease. No evidence of pulmonary emboli. Changes of prior granulomatous disease. Aortic Atherosclerosis (ICD10-I70.0) and Emphysema (ICD10-J43.9). Electronically Signed   By: MInez CatalinaM.D.   On: 01/20/2022 02:27   DG Chest Port 1 View  Result Date: 01/19/2022 CLINICAL DATA:  [id number] [id number]: PORTABLE CHEST 1 VIEW COMPARISON:  Chest x-ray 10/17/2021, PET CT 12/12/2021 FINDINGS: The heart and mediastinal contours are unchanged. Low lung volumes. Similar-appearing calcification overlying right lung and better evaluated on PET-CT 12/12/2021. Interval development of a right apical airspace opacity. No pulmonary edema. No pleural effusion. No pneumothorax. Sclerotic appearance of the appendicular skeleton consistent with known metastases. Reverse total left shoulder arthroplasty. IMPRESSION: Interval development of a right apical airspace opacity. Finding may be due to positioning. Recommend repeat PA and lateral view of the chest. Electronically Signed   By: MIven FinnM.D.   On: 01/19/2022 22:04   UKoreaVenous Img Lower Unilateral  Left  Result Date: 01/08/2022 CLINICAL DATA:  History left gastrocnemius vein DVT in August. EXAM: LEFT LOWER EXTREMITY VENOUS DOPPLER ULTRASOUND TECHNIQUE: Gray-scale sonography with graded compression, as well as color Doppler and duplex ultrasound were performed to evaluate the lower extremity deep venous systems from the level of the common femoral vein and including the common femoral, femoral, profunda femoral, popliteal and calf veins including the posterior tibial, peroneal and gastrocnemius veins when visible. The superficial great saphenous vein was also interrogated. Spectral Doppler was utilized to evaluate flow at rest and with distal augmentation maneuvers in the common femoral, femoral and popliteal veins. COMPARISON:  10[facility]11/2021 [facility] FINDINGS: Contralateral Common Femoral Vein: Respiratory phasicity is normal and symmetric with the symptomatic side. No evidence of thrombus. Normal compressibility. Common Femoral Vein: No evidence of thrombus. Normal compressibility, respiratory phasicity and response to augmentation. Saphenofemoral Junction: No evidence of thrombus. Normal compressibility and flow on color Doppler imaging. Profunda Femoral Vein: No evidence of thrombus. Normal compressibility and flow on color Doppler imaging. Femoral Vein: No evidence of thrombus. Normal compressibility, respiratory phasicity and  response to augmentation. Popliteal Vein: No evidence of thrombus. Normal compressibility, respiratory phasicity and response to augmentation. Calf Veins: No evidence of thrombus. No evidence of visualized gastrocnemius vein thrombus. Normal compressibility and flow on color Doppler imaging. Superficial Great Saphenous Vein: No evidence of thrombus. Normal compressibility. Venous Reflux:  None. Other Findings: No evidence of superficial thrombophlebitis or abnormal fluid collection. IMPRESSION: No evidence of left lower extremity deep venous  thrombosis. No evidence of visualized gastrocnemius vein thrombus. Electronically Signed   By: Aletta Edouard M.D.   On: 01/08/2022 11:18     Subjective: Patient was seen and examined at bedside.  Overnight events noted.   Patient reports doing much better and wants to be discharged.   Patient being discharged home on Keflex.  Discharge Exam: Vitals:   01/22/22 0450 01/22/22 0735  BP: (!) 157/82 (!) 149/92  Pulse: 77 71  Resp: 18 (!) 21  Temp: 98.5 F (36.9 C) 97.6 F (36.4 C)  SpO2: 94% 95%   Vitals:   01/21/22 2138 01/22/22 0450 01/22/22 0552 01/22/22 0735  BP: (!) 159/81 (!) 157/82  (!) 149/92  Pulse: 81 77  71  Resp: 18 18  (!) 21  Temp: 98.2 F (36.8 C) 98.5 F (36.9 C)  97.6 F (36.4 C)  TempSrc: Oral Oral  Oral  SpO2: 94% 94%  95%  Weight:   81.5 kg   Height:        General: Pt is alert, awake, not in acute distress Cardiovascular: RRR, S1/S2 +, no rubs, no gallops Respiratory: CTA bilaterally, no wheezing, no rhonchi Abdominal: Soft, NT, ND, bowel sounds + Extremities: no edema, no cyanosis    The results of significant diagnostics from this hospitalization (including imaging, microbiology, ancillary and laboratory) are listed below for reference.     Microbiology: Recent Results (from the past 240 hour(s))  Urine Culture     Status: Abnormal   Collection Time: 01/19/22  9:16 PM   Specimen: Urine, Catheterized  Result Value Ref Range Status   Specimen Description   Final    URINE, CATHETERIZED Performed at [facility], [address] [place], [place] [address]    Special Requests   Final    NONE Performed at [facility], [address] [place], [place] [address]    Culture >=100,000 COLONIES/mL KLEBSIELLA PNEUMONIAE (A)  Final   Report Status 01/22/2022 FINAL  Final   Organism ID, Bacteria KLEBSIELLA PNEUMONIAE (A)  Final      Susceptibility   Klebsiella pneumoniae - MIC*    AMPICILLIN RESISTANT Resistant     CEFAZOLIN  <=4 SENSITIVE Sensitive     CEFEPIME <=0.12 SENSITIVE Sensitive     CEFTRIAXONE <=0.25 SENSITIVE Sensitive     CIPROFLOXACIN <=0.25 SENSITIVE Sensitive     GENTAMICIN <=1 SENSITIVE Sensitive     IMIPENEM 0.5 SENSITIVE Sensitive     NITROFURANTOIN 64 INTERMEDIATE Intermediate     TRIMETH/SULFA <=20 SENSITIVE Sensitive     AMPICILLIN/SULBACTAM 4 SENSITIVE Sensitive     PIP/TAZO <=4 SENSITIVE Sensitive     * >=100,000 COLONIES/mL KLEBSIELLA PNEUMONIAE  Resp Panel by RT-PCR (Flu A&B, Covid) Anterior Nasal Swab     Status: None   Collection Time: 01/19/22  9:24 PM   Specimen: Anterior Nasal Swab  Result Value Ref Range Status   SARS Coronavirus 2 by RT PCR NEGATIVE NEGATIVE Final    Comment: (NOTE) SARS-CoV-2 target nucleic acids are NOT DETECTED.  The SARS-CoV-2 RNA is generally detectable in upper  respiratory specimens during the acute phase of infection. The lowest concentration of SARS-CoV-2 viral copies this assay can detect is 138 copies/mL. A negative result does not preclude SARS-Cov-2 infection and should not be used as the sole basis for treatment or other patient management decisions. A negative result may occur with  improper specimen collection/handling, submission of specimen other than nasopharyngeal swab, presence of viral mutation(s) within the areas targeted by this assay, and inadequate number of viral copies(<138 copies/mL). A negative result must be combined with clinical observations, patient history, and epidemiological information. The expected result is Negative.  Fact Sheet for Patients:  EntrepreneurPulse.com.au  Fact Sheet for Healthcare Providers:  IncredibleEmployment.be  This test is no t yet approved or cleared by the [place] FDA and  has been authorized for detection and/or diagnosis of SARS-CoV-2 by FDA under an Emergency Use Authorization (EUA). This EUA will remain  in effect (meaning this test can be  used) for the duration of the COVID-19 declaration under Section 564(b)(1) of the Act, 21 U.S.C.section 360bbb-3(b)(1), unless the authorization is terminated  or revoked sooner.       Influenza A by PCR NEGATIVE NEGATIVE Final   Influenza B by PCR NEGATIVE NEGATIVE Final    Comment: (NOTE) The Xpert Xpress SARS-CoV-2/FLU/RSV plus assay is intended as an aid in the diagnosis of influenza from Nasopharyngeal swab specimens and should not be used as a sole basis for treatment. Nasal washings and aspirates are unacceptable for Xpert Xpress SARS-CoV-2/FLU/RSV testing.  Fact Sheet for Patients: EntrepreneurPulse.com.au  Fact Sheet for Healthcare Providers: IncredibleEmployment.be  This test is not yet approved or cleared by the [place] FDA and has been authorized for detection and/or diagnosis of SARS-CoV-2 by FDA under an Emergency Use Authorization (EUA). This EUA will remain in effect (meaning this test can be used) for the duration of the COVID-19 declaration under Section 564(b)(1) of the Act, 21 U.S.C. section 360bbb-3(b)(1), unless the authorization is terminated or revoked.  Performed at [facility], [address] [place], [place] [address]   Blood culture (single)     Status: None (Preliminary result)   Collection Time: 01/19/22  9:24 PM   Specimen: BLOOD  Result Value Ref Range Status   Specimen Description   Final    BLOOD Blood Culture results may not be optimal due to an excessive volume of blood received in culture bottles   Special Requests   Final    BOTTLES DRAWN AEROBIC AND ANAEROBIC BLOOD RIGHT ARM   Culture   Final    NO GROWTH 3 DAYS Performed at [facility], [address], [place], [place] [address]    Report Status PENDING  Incomplete  Blood culture (single)     Status: None (Preliminary result)   Collection Time: 01/20/22 12:23 AM   Specimen: BLOOD  Result Value Ref Range Status    Specimen Description BLOOD BLOOD LEFT ARM  Final   Special Requests   Final    BOTTLES DRAWN AEROBIC AND ANAEROBIC Blood Culture results may not be optimal due to an excessive volume of blood received in culture bottles   Culture   Final    NO GROWTH 2 DAYS Performed at [facility], [address], [place], [place] [address]    Report Status PENDING  Incomplete     Labs: BNP (last 3 results) No results for input(s): "BNP" in the last 8760 hours. Basic Metabolic Panel: Recent Labs  Lab 01/19/22 2124 01/20/22 0333 01/22/22 0445  NA 135 139 135  K 3.3* 3.2* 3.7  CL 102 107 109  CO2 24 24 20*  GLUCOSE 121* 103* 98  BUN '13 14 16  '$ CREATININE 0.35* 0.43* 0.39*  CALCIUM 8.9 8.7* 8.6*  MG  --  2.0  --    Liver Function Tests: Recent Labs  Lab 01/19/22 2124 01/20/22 0333  AST 25 21  ALT 22 18  ALKPHOS 169* 135*  BILITOT 1.2 1.0  PROT 6.8 5.7*  ALBUMIN 3.5 3.0*   No results for input(s): "LIPASE", "AMYLASE" in the last 168 hours. No results for input(s): "AMMONIA" in the last 168 hours. CBC: Recent Labs  Lab 01/19/22 2124 01/20/22 0333 01/22/22 0445  WBC 9.6 6.8 5.5  NEUTROABS 7.9*  --   --   HGB 12.8* 11.1* 11.1*  HCT 38.2* 34.0* 34.4*  MCV 89.5 90.7 91.5  PLT 206 174 179   Cardiac Enzymes: No results for input(s): "CKTOTAL", "CKMB", "CKMBINDEX", "TROPONINI" in the last 168 hours. BNP: Invalid input(s): "POCBNP" CBG: No results for input(s): "GLUCAP" in the last 168 hours. D-Dimer No results for input(s): "DDIMER" in the last 72 hours. Hgb A1c No results for input(s): "HGBA1C" in the last 72 hours. Lipid Profile No results for input(s): "CHOL", "HDL", "LDLCALC", "TRIG", "CHOLHDL", "LDLDIRECT" in the last 72 hours. Thyroid function studies No results for input(s): "TSH", "T4TOTAL", "T3FREE", "THYROIDAB" in the last 72 hours.  Invalid input(s): "FREET3" Anemia work up No results for input(s): "VITAMINB12", "FOLATE", "FERRITIN", "TIBC", "IRON",  "RETICCTPCT" in the last 72 hours. Urinalysis    Component Value Date/Time   COLORURINE YELLOW (A) 01/19/2022 2124   APPEARANCEUR HAZY (A) 01/19/2022 2124   APPEARANCEUR Clear 07/04/2016 0843   LABSPEC 1.006 01/19/2022 2124   PHURINE 7.0 01/19/2022 2124   GLUCOSEU NEGATIVE 01/19/2022 2124   HGBUR MODERATE (A) 01/19/2022 2124   BILIRUBINUR NEGATIVE 01/19/2022 2124   BILIRUBINUR Negative 07/15/2018 1002   BILIRUBINUR Negative 07/04/2016 0843   KETONESUR NEGATIVE 01/19/2022 2124   PROTEINUR NEGATIVE 01/19/2022 2124   UROBILINOGEN 0.2 07/15/2018 1002   NITRITE POSITIVE (A) 01/19/2022 2124   LEUKOCYTESUR LARGE (A) 01/19/2022 2124   Sepsis Labs Recent Labs  Lab 01/19/22 2124 01/20/22 0333 01/22/22 0445  WBC 9.6 6.8 5.5   Microbiology Recent Results (from the past 240 hour(s))  Urine Culture     Status: Abnormal   Collection Time: 01/19/22  9:16 PM   Specimen: Urine, Catheterized  Result Value Ref Range Status   Specimen Description   Final    URINE, CATHETERIZED Performed at [facility], [address], [place], [place] [address]    Special Requests   Final    NONE Performed at [facility], [address] [place], [place] [address]    Culture >=100,000 COLONIES/mL KLEBSIELLA PNEUMONIAE (A)  Final   Report Status 01/22/2022 FINAL  Final   Organism ID, Bacteria KLEBSIELLA PNEUMONIAE (A)  Final      Susceptibility   Klebsiella pneumoniae - MIC*    AMPICILLIN RESISTANT Resistant     CEFAZOLIN <=4 SENSITIVE Sensitive     CEFEPIME <=0.12 SENSITIVE Sensitive     CEFTRIAXONE <=0.25 SENSITIVE Sensitive     CIPROFLOXACIN <=0.25 SENSITIVE Sensitive     GENTAMICIN <=1 SENSITIVE Sensitive     IMIPENEM 0.5 SENSITIVE Sensitive     NITROFURANTOIN 64 INTERMEDIATE Intermediate     TRIMETH/SULFA <=20 SENSITIVE Sensitive     AMPICILLIN/SULBACTAM 4 SENSITIVE Sensitive     PIP/TAZO <=4 SENSITIVE Sensitive     * >=  100,000 COLONIES/mL KLEBSIELLA PNEUMONIAE  Resp  Panel by RT-PCR (Flu A&B, Covid) Anterior Nasal Swab     Status: None   Collection Time: 01/19/22  9:24 PM   Specimen: Anterior Nasal Swab  Result Value Ref Range Status   SARS Coronavirus 2 by RT PCR NEGATIVE NEGATIVE Final    Comment: (NOTE) SARS-CoV-2 target nucleic acids are NOT DETECTED.  The SARS-CoV-2 RNA is generally detectable in upper respiratory specimens during the acute phase of infection. The lowest concentration of SARS-CoV-2 viral copies this assay can detect is 138 copies/mL. A negative result does not preclude SARS-Cov-2 infection and should not be used as the sole basis for treatment or other patient management decisions. A negative result may occur with  improper specimen collection/handling, submission of specimen other than nasopharyngeal swab, presence of viral mutation(s) within the areas targeted by this assay, and inadequate number of viral copies(<138 copies/mL). A negative result must be combined with clinical observations, patient history, and epidemiological information. The expected result is Negative.  Fact Sheet for Patients:  EntrepreneurPulse.com.au  Fact Sheet for Healthcare Providers:  IncredibleEmployment.be  This test is no t yet approved or cleared by the [place] FDA and  has been authorized for detection and/or diagnosis of SARS-CoV-2 by FDA under an Emergency Use Authorization (EUA). This EUA will remain  in effect (meaning this test can be used) for the duration of the COVID-19 declaration under Section 564(b)(1) of the Act, 21 U.S.C.section 360bbb-3(b)(1), unless the authorization is terminated  or revoked sooner.       Influenza A by PCR NEGATIVE NEGATIVE Final   Influenza B by PCR NEGATIVE NEGATIVE Final    Comment: (NOTE) The Xpert Xpress SARS-CoV-2/FLU/RSV plus assay is intended as an aid in the diagnosis of influenza from Nasopharyngeal swab specimens and should not be used as a sole  basis for treatment. Nasal washings and aspirates are unacceptable for Xpert Xpress SARS-CoV-2/FLU/RSV testing.  Fact Sheet for Patients: EntrepreneurPulse.com.au  Fact Sheet for Healthcare Providers: IncredibleEmployment.be  This test is not yet approved or cleared by the [place] FDA and has been authorized for detection and/or diagnosis of SARS-CoV-2 by FDA under an Emergency Use Authorization (EUA). This EUA will remain in effect (meaning this test can be used) for the duration of the COVID-19 declaration under Section 564(b)(1) of the Act, 21 U.S.C. section 360bbb-3(b)(1), unless the authorization is terminated or revoked.  Performed at [facility], [address] [place], [place] [address]   Blood culture (single)     Status: None (Preliminary result)   Collection Time: 01/19/22  9:24 PM   Specimen: BLOOD  Result Value Ref Range Status   Specimen Description   Final    BLOOD Blood Culture results may not be optimal due to an excessive volume of blood received in culture bottles   Special Requests   Final    BOTTLES DRAWN AEROBIC AND ANAEROBIC BLOOD RIGHT ARM   Culture   Final    NO GROWTH 3 DAYS Performed at [facility], [address], [place], [place] [address]    Report Status PENDING  Incomplete  Blood culture (single)     Status: None (Preliminary result)   Collection Time: 01/20/22 12:23 AM   Specimen: BLOOD  Result Value Ref Range Status   Specimen Description BLOOD BLOOD LEFT ARM  Final   Special Requests   Final    BOTTLES DRAWN AEROBIC AND ANAEROBIC Blood Culture results may not be optimal due to an  excessive volume of blood received in culture bottles   Culture   Final    NO GROWTH 2 DAYS Performed at [facility], [address] [place], [place] [address]    Report Status PENDING  Incomplete     Time coordinating discharge: Over 30 minutes  SIGNED:   Shawna Clamp,  MD  [facility] 01/22/2022, 3:51 PM Pager   If 7PM-7AM, please contact night-coverage

## 2022-01-22 NOTE — Progress Notes (Signed)
discharged

## 2022-01-22 NOTE — TOC Transition Note (Signed)
Transition of Care [facility]) - CM/SW Discharge Note   Patient Details  Name: Jason [name] MRN: [id number] Date of Birth: Feb 01, 1956  [facility]) CM/SW Contact:  Laurena Slimmer, RN Phone Number: 01/22/2022, 11:38 AM   Clinical Narrative:    Spoke with patient's wife regarding discharge today, Patient will be discharging home by EMS. Confirmed with Merleen Nicely from [facility], patient is currently active for RN, and PT. Requested resumption order.   EMS packet arranged  EMS scheduled.   TOC signing off.              Patient Goals and CMS Choice        Discharge Placement                       Discharge Plan and Services                                     Social Determinants of Health (SDOH) Interventions     Readmission Risk Interventions     No data to display

## 2022-01-22 NOTE — Progress Notes (Signed)
Awaiting EMS for discharge.

## 2022-01-22 NOTE — Progress Notes (Signed)
Preparing for discharge to home via EMS

## 2022-01-24 ENCOUNTER — Ambulatory Visit: Payer: BC Managed Care – PPO | Admitting: Physical Medicine and Rehabilitation

## 2022-01-24 ENCOUNTER — Telehealth: Payer: Self-pay

## 2022-01-24 LAB — CULTURE, BLOOD (SINGLE): Culture: NO GROWTH

## 2022-01-24 NOTE — Telephone Encounter (Signed)
Spoke to pt. He said he is felling good. Doing his exercises. We discussed that the hospital had set him up with an appt 01-28-22. He cannot come in for that. Dr Silvio Pate will go out and see him the next few weeks.

## 2022-01-25 ENCOUNTER — Other Ambulatory Visit: Payer: Self-pay | Admitting: Internal Medicine

## 2022-01-25 DIAGNOSIS — C61 Malignant neoplasm of prostate: Secondary | ICD-10-CM | POA: Diagnosis not present

## 2022-01-25 DIAGNOSIS — I200 Unstable angina: Secondary | ICD-10-CM | POA: Diagnosis not present

## 2022-01-25 DIAGNOSIS — M1990 Unspecified osteoarthritis, unspecified site: Secondary | ICD-10-CM | POA: Diagnosis not present

## 2022-01-25 DIAGNOSIS — I10 Essential (primary) hypertension: Secondary | ICD-10-CM | POA: Diagnosis not present

## 2022-01-25 DIAGNOSIS — D630 Anemia in neoplastic disease: Secondary | ICD-10-CM | POA: Diagnosis not present

## 2022-01-25 DIAGNOSIS — E039 Hypothyroidism, unspecified: Secondary | ICD-10-CM | POA: Diagnosis not present

## 2022-01-25 DIAGNOSIS — C7940 Secondary malignant neoplasm of unspecified part of nervous system: Secondary | ICD-10-CM | POA: Diagnosis not present

## 2022-01-25 DIAGNOSIS — C7951 Secondary malignant neoplasm of bone: Secondary | ICD-10-CM | POA: Diagnosis not present

## 2022-01-25 DIAGNOSIS — J9691 Respiratory failure, unspecified with hypoxia: Secondary | ICD-10-CM | POA: Diagnosis not present

## 2022-01-25 DIAGNOSIS — N400 Enlarged prostate without lower urinary tract symptoms: Secondary | ICD-10-CM | POA: Diagnosis not present

## 2022-01-25 DIAGNOSIS — K219 Gastro-esophageal reflux disease without esophagitis: Secondary | ICD-10-CM | POA: Diagnosis not present

## 2022-01-25 DIAGNOSIS — D509 Iron deficiency anemia, unspecified: Secondary | ICD-10-CM | POA: Diagnosis not present

## 2022-01-25 DIAGNOSIS — F419 Anxiety disorder, unspecified: Secondary | ICD-10-CM | POA: Diagnosis not present

## 2022-01-25 DIAGNOSIS — J9692 Respiratory failure, unspecified with hypercapnia: Secondary | ICD-10-CM | POA: Diagnosis not present

## 2022-01-25 DIAGNOSIS — I959 Hypotension, unspecified: Secondary | ICD-10-CM | POA: Diagnosis not present

## 2022-01-25 DIAGNOSIS — G8254 Quadriplegia, C5-C7 incomplete: Secondary | ICD-10-CM | POA: Diagnosis not present

## 2022-01-25 LAB — CULTURE, BLOOD (SINGLE): Culture: NO GROWTH

## 2022-01-27 ENCOUNTER — Encounter: Payer: Self-pay | Admitting: Internal Medicine

## 2022-01-27 NOTE — Telephone Encounter (Signed)
Patient wife [name] called in and wanted to know if his temperature was to spike if he should go back to the hospital. She is worried that it could be the UTI still or if she tugged something when the port came out. Sent over to access nurse.

## 2022-01-28 ENCOUNTER — Inpatient Hospital Stay: Payer: BC Managed Care – PPO | Admitting: Internal Medicine

## 2022-01-28 ENCOUNTER — Telehealth: Payer: Self-pay | Admitting: Internal Medicine

## 2022-01-28 DIAGNOSIS — J9692 Respiratory failure, unspecified with hypercapnia: Secondary | ICD-10-CM | POA: Diagnosis not present

## 2022-01-28 DIAGNOSIS — E039 Hypothyroidism, unspecified: Secondary | ICD-10-CM | POA: Diagnosis not present

## 2022-01-28 DIAGNOSIS — G8254 Quadriplegia, C5-C7 incomplete: Secondary | ICD-10-CM | POA: Diagnosis not present

## 2022-01-28 DIAGNOSIS — I10 Essential (primary) hypertension: Secondary | ICD-10-CM | POA: Diagnosis not present

## 2022-01-28 DIAGNOSIS — N400 Enlarged prostate without lower urinary tract symptoms: Secondary | ICD-10-CM | POA: Diagnosis not present

## 2022-01-28 DIAGNOSIS — C7940 Secondary malignant neoplasm of unspecified part of nervous system: Secondary | ICD-10-CM | POA: Diagnosis not present

## 2022-01-28 DIAGNOSIS — C61 Malignant neoplasm of prostate: Secondary | ICD-10-CM | POA: Diagnosis not present

## 2022-01-28 DIAGNOSIS — J9691 Respiratory failure, unspecified with hypoxia: Secondary | ICD-10-CM | POA: Diagnosis not present

## 2022-01-28 DIAGNOSIS — F419 Anxiety disorder, unspecified: Secondary | ICD-10-CM | POA: Diagnosis not present

## 2022-01-28 DIAGNOSIS — M1990 Unspecified osteoarthritis, unspecified site: Secondary | ICD-10-CM | POA: Diagnosis not present

## 2022-01-28 DIAGNOSIS — K219 Gastro-esophageal reflux disease without esophagitis: Secondary | ICD-10-CM | POA: Diagnosis not present

## 2022-01-28 DIAGNOSIS — D509 Iron deficiency anemia, unspecified: Secondary | ICD-10-CM | POA: Diagnosis not present

## 2022-01-28 DIAGNOSIS — C7951 Secondary malignant neoplasm of bone: Secondary | ICD-10-CM | POA: Diagnosis not present

## 2022-01-28 DIAGNOSIS — I959 Hypotension, unspecified: Secondary | ICD-10-CM | POA: Diagnosis not present

## 2022-01-28 DIAGNOSIS — D630 Anemia in neoplastic disease: Secondary | ICD-10-CM | POA: Diagnosis not present

## 2022-01-28 DIAGNOSIS — I200 Unstable angina: Secondary | ICD-10-CM | POA: Diagnosis not present

## 2022-01-28 NOTE — Telephone Encounter (Signed)
Home Health verbal orders Caller Name: [place] Name: [facility] number: [phone]  Requesting OT  Frequency: 1x a week for 8 weeks  Please forward to [facility] pool or providers CMA

## 2022-01-28 NOTE — Telephone Encounter (Signed)
Sent note to Dr Silvio Pate and Silvio Pate pool for review and teams [facility] CMA.   [facility] - Client TELEPHONE ADVICE RECORD AccessNurse Patient Name: Jason [name] Gender: Male DOB: 07-Sep-1955 Age: 66 Y 49 M 27 D Return Phone Number: [phone] (Primary) Address: City/ State/ Zip: [place] [address] Client [facility] - Client Client Site [place] - Day Provider Viviana Simpler- MD Contact Type Call Who Is Calling Patient / Member / Family / Caregiver Call Type Triage / Clinical Caller Name [name] Relationship To Patient Spouse Return Phone Number [phone] (Primary) Chief Complaint Urination Frequency Reason for Call Symptomatic / Request for Health Information Initial Comment The caller states her was recently husband was in the hospital for a UTI and was taking antibiotics but is feeling pressure to urinate. The callers concern is that his port came apart that is on his leg and may have dislodged his catheter Saturday night and would like to know if this might be causing the symptoms or if he is having another UTI. Symptoms include urinating frequency, and feeling pressure when urinating, and fever runs between 95-97. Additional Comment The caller states she would like to know if he should take his calming medicaiton sooner this evening Translation No Nurse Assessment Nurse: Sharen Counter, RN, Ashby Dawes Date/Time (Eastern Time): 01/27/2022 5:04:36 PM Confirm and document reason for call. If symptomatic, describe symptoms. ---Wife states the port that connects to the bag got disconnected on Saturday somehow when pt was turning in the bed. Pt has been having pressure to urinate since Saturday night / Sunday morning. Caller states pt is still getting urine output in catheter bag. Does the patient have any new or worsening symptoms? ---Yes Will a triage be completed? ---Yes Related visit  to physician within the last 2 weeks? ---Yes Does the PT have any chronic conditions? (i.e. diabetes, asthma, this includes High risk factors for pregnancy, etc.) ---Yes List chronic conditions. 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 ---No PLEASE NOTE: All timestamps contained within this report are represented as [place] Standard Time. CONFIDENTIALTY NOTICE: This fax transmission is intended only for the addressee. It contains information that is legally privileged, confidential or otherwise protected from use or disclosure. If you are not the intended recipient, you are strictly prohibited from reviewing, disclosing, copying using or disseminating any of this information or taking any action in reliance on or regarding this information. If you have received this fax in error, please notify us immediately by telephone so that we can arrange for its return to [place]. Phone: [phone], Toll-Free: [phone], Fax: [phone] Page: 2 of 2 Call Id: [id number] Guidelines Guideline Title Affirmed Question Affirmed Notes Nurse Date/Time Eilene Ghazi Time) Urinary Catheter (e.g. Foley) Symptoms and Questions [1] Catheter is broken or cracked AND [2] still works (functioning normally) Sharen Counter, RN, [facility] 01/27/2022 5:09:23 PM Disp. Time Eilene Ghazi Time) Disposition Final User 01/27/2022 5:21:36 PM See PCP within 24 Hours Yes Sharen Counter RN, Ashby Dawes Final Disposition 01/27/2022 5:21:36 PM See PCP within 24 Hours Yes Sharen Counter, RN, Ashby Dawes Caller Disagree/Comply Comply Caller Understands Yes PreDisposition Go to ED Care Advice Given Per Guideline SEE PCP WITHIN 24 HOURS: * IF OFFICE WILL BE OPEN: You need to be examined within the next 24 hours. Call your doctor (or NP/PA) when the office opens and make an appointment. CALL BACK IF: CARE ADVICE given per Urinary Catheter Symptoms and Questions (  Adult) guideline. * You become  worse Comments User: Hal Hope, RN Date/Time Eilene Ghazi Time): 01/27/2022 5:11:11 PM 350 mls currently in box attached to bag. Bag has emptied 4-5x today. User: Hal Hope, RN Date/Time Eilene Ghazi Time): 01/27/2022 5:16:33 PM Wife states the swivel that attaches port is broken . The sticky portion that adheres to leg is still in place. User: Hal Hope, RN Date/Time Eilene Ghazi Time): 01/27/2022 5:18:58 PM Pt has had a cath since 09/2021, and has had urge to uriante before . Pt is concerned the pressure he is having now is more present with sitting relieves with laying down. Referrals REFERRED TO PCP OFFIC

## 2022-01-28 NOTE — Telephone Encounter (Signed)
Home Health verbal orders Caller Name: Aubery Lapping  Agency Name: [facility]   Callback number: [phone], secured   Requesting OT/PT/Skilled nursing/Social Work/Speech: nursing & foley care  Reason:  Frequency: one week one (today only, 01/28/22) once week two , once every other week for six weeks   Please forward to [facility] pool or providers CMA

## 2022-01-29 ENCOUNTER — Encounter (HOSPITAL_COMMUNITY): Payer: Self-pay | Admitting: *Deleted

## 2022-01-29 DIAGNOSIS — I10 Essential (primary) hypertension: Secondary | ICD-10-CM | POA: Diagnosis not present

## 2022-01-29 DIAGNOSIS — I200 Unstable angina: Secondary | ICD-10-CM | POA: Diagnosis not present

## 2022-01-29 DIAGNOSIS — C7940 Secondary malignant neoplasm of unspecified part of nervous system: Secondary | ICD-10-CM | POA: Diagnosis not present

## 2022-01-29 DIAGNOSIS — D509 Iron deficiency anemia, unspecified: Secondary | ICD-10-CM | POA: Diagnosis not present

## 2022-01-29 DIAGNOSIS — J9691 Respiratory failure, unspecified with hypoxia: Secondary | ICD-10-CM | POA: Diagnosis not present

## 2022-01-29 DIAGNOSIS — C61 Malignant neoplasm of prostate: Secondary | ICD-10-CM | POA: Diagnosis not present

## 2022-01-29 DIAGNOSIS — E039 Hypothyroidism, unspecified: Secondary | ICD-10-CM | POA: Diagnosis not present

## 2022-01-29 DIAGNOSIS — N400 Enlarged prostate without lower urinary tract symptoms: Secondary | ICD-10-CM | POA: Diagnosis not present

## 2022-01-29 DIAGNOSIS — D630 Anemia in neoplastic disease: Secondary | ICD-10-CM | POA: Diagnosis not present

## 2022-01-29 DIAGNOSIS — I959 Hypotension, unspecified: Secondary | ICD-10-CM | POA: Diagnosis not present

## 2022-01-29 DIAGNOSIS — F419 Anxiety disorder, unspecified: Secondary | ICD-10-CM | POA: Diagnosis not present

## 2022-01-29 DIAGNOSIS — K219 Gastro-esophageal reflux disease without esophagitis: Secondary | ICD-10-CM | POA: Diagnosis not present

## 2022-01-29 DIAGNOSIS — M1990 Unspecified osteoarthritis, unspecified site: Secondary | ICD-10-CM | POA: Diagnosis not present

## 2022-01-29 DIAGNOSIS — J9692 Respiratory failure, unspecified with hypercapnia: Secondary | ICD-10-CM | POA: Diagnosis not present

## 2022-01-29 DIAGNOSIS — G8254 Quadriplegia, C5-C7 incomplete: Secondary | ICD-10-CM | POA: Diagnosis not present

## 2022-01-29 DIAGNOSIS — C7951 Secondary malignant neoplasm of bone: Secondary | ICD-10-CM | POA: Diagnosis not present

## 2022-01-29 NOTE — Telephone Encounter (Signed)
Verbal orders given to Stephanie.  °

## 2022-01-29 NOTE — Telephone Encounter (Signed)
Verbal orders given to [employer]

## 2022-01-29 NOTE — Progress Notes (Addendum)
Patient's wife Obinna Ehresman stated that the procedure was cancelled due to not having authorization approval for procedure.   PCP - Dr Viviana Simpler Cardiologist - Dr Harrell Gave End Oncology - Dr Earlie Server  Chest x-ray - 01/19/22 EKG - 01/19/22 Stress Test - 02/2020 ECHO - 10/08/21 Cardiac Cath - 03/20/20  ICD Pacemaker/Loop - n/a  Sleep Study -  n/a CPAP - none

## 2022-01-30 ENCOUNTER — Ambulatory Visit (HOSPITAL_COMMUNITY)
Admission: RE | Admit: 2022-01-30 | Discharge: 2022-01-30 | Disposition: A | Discharge status: Home / Self Care | Payer: BC Managed Care – PPO | Source: Ambulatory Visit | Attending: Internal Medicine | Admitting: Internal Medicine

## 2022-01-30 ENCOUNTER — Ambulatory Visit (HOSPITAL_COMMUNITY)
Admission: RE | Admit: 2022-01-30 | Discharge: 2022-01-30 | Disposition: A | Discharge status: Home / Self Care | Payer: BC Managed Care – PPO | Attending: Internal Medicine | Admitting: Internal Medicine

## 2022-01-30 ENCOUNTER — Encounter (HOSPITAL_COMMUNITY): Payer: Self-pay

## 2022-01-30 ENCOUNTER — Other Ambulatory Visit: Payer: Self-pay

## 2022-01-30 ENCOUNTER — Ambulatory Visit (HOSPITAL_COMMUNITY): Admission: RE | Admit: 2022-01-30 | Payer: BC Managed Care – PPO | Source: Ambulatory Visit

## 2022-01-30 ENCOUNTER — Ambulatory Visit (HOSPITAL_COMMUNITY): Payer: BC Managed Care – PPO | Admitting: Certified Registered Nurse Anesthetist

## 2022-01-30 ENCOUNTER — Encounter (HOSPITAL_COMMUNITY): Admission: RE | Disposition: A | Discharge status: Home / Self Care | Payer: Self-pay | Source: Home / Self Care

## 2022-01-30 DIAGNOSIS — M4802 Spinal stenosis, cervical region: Secondary | ICD-10-CM | POA: Diagnosis not present

## 2022-01-30 DIAGNOSIS — E039 Hypothyroidism, unspecified: Secondary | ICD-10-CM | POA: Diagnosis not present

## 2022-01-30 DIAGNOSIS — C61 Malignant neoplasm of prostate: Secondary | ICD-10-CM | POA: Diagnosis not present

## 2022-01-30 DIAGNOSIS — Z923 Personal history of irradiation: Secondary | ICD-10-CM | POA: Diagnosis not present

## 2022-01-30 DIAGNOSIS — Z539 Procedure and treatment not carried out, unspecified reason: Secondary | ICD-10-CM | POA: Diagnosis not present

## 2022-01-30 DIAGNOSIS — I119 Hypertensive heart disease without heart failure: Secondary | ICD-10-CM | POA: Diagnosis not present

## 2022-01-30 DIAGNOSIS — C412 Malignant neoplasm of vertebral column: Secondary | ICD-10-CM | POA: Diagnosis not present

## 2022-01-30 DIAGNOSIS — C7951 Secondary malignant neoplasm of bone: Secondary | ICD-10-CM | POA: Diagnosis not present

## 2022-01-30 HISTORY — PX: RADIOLOGY WITH ANESTHESIA: SHX6223

## 2022-01-30 HISTORY — DX: Personal history of urinary calculi: Z87.442

## 2022-01-30 SURGERY — MRI WITH ANESTHESIA
Anesthesia: General

## 2022-01-30 MED ORDER — LACTATED RINGERS IV SOLN: INTRAVENOUS | Status: DC | PRN

## 2022-01-30 MED ORDER — PHENYLEPHRINE HCL-NACL 20-0.9 MG/250ML-% IV SOLN
INTRAVENOUS | Status: DC | PRN
  Administered 2022-01-30: 30 ug/min via INTRAVENOUS

## 2022-01-30 MED ORDER — DEXAMETHASONE SODIUM PHOSPHATE 10 MG/ML IJ SOLN
INTRAMUSCULAR | Status: DC | PRN
  Administered 2022-01-30: 10 mg via INTRAVENOUS

## 2022-01-30 MED ORDER — PROPOFOL 10 MG/ML IV BOLUS
INTRAVENOUS | Status: DC | PRN
  Administered 2022-01-30: 110 mg via INTRAVENOUS

## 2022-01-30 MED ORDER — LIDOCAINE 2% (20 MG/ML) 5 ML SYRINGE
INTRAMUSCULAR | Status: DC | PRN
  Administered 2022-01-30: 60 mg via INTRAVENOUS

## 2022-01-30 MED ORDER — CHLORHEXIDINE GLUCONATE 0.12 % MT SOLN
OROMUCOSAL | Status: AC
  Filled 2022-01-30: qty 15

## 2022-01-30 MED ORDER — EPHEDRINE SULFATE-NACL 50-0.9 MG/10ML-% IV SOSY
PREFILLED_SYRINGE | INTRAVENOUS | Status: DC | PRN
  Administered 2022-01-30: 10 mg via INTRAVENOUS
  Administered 2022-01-30: 15 mg via INTRAVENOUS
  Administered 2022-01-30: 10 mg via INTRAVENOUS

## 2022-01-30 MED ORDER — MIDAZOLAM HCL 2 MG/2ML IJ SOLN
INTRAMUSCULAR | Status: DC | PRN
  Administered 2022-01-30: 2 mg via INTRAVENOUS

## 2022-01-30 MED ORDER — GADOBUTROL 1 MMOL/ML IV SOLN
8.0000 mL | Freq: Once | INTRAVENOUS | Status: AC | PRN
  Administered 2022-01-30: 8 mL via INTRAVENOUS

## 2022-01-30 MED ORDER — ONDANSETRON HCL 4 MG/2ML IJ SOLN
INTRAMUSCULAR | Status: DC | PRN
  Administered 2022-01-30: 4 mg via INTRAVENOUS

## 2022-01-30 NOTE — Anesthesia Procedure Notes (Signed)
Procedure Name: LMA Insertion Date/Time: 01/30/2022 9:07 AM  Performed by: Lance Coon, CRNAPre-anesthesia Checklist: Patient identified, Timeout performed, Emergency Drugs available, Suction available and Patient being monitored Patient Re-evaluated:Patient Re-evaluated prior to induction Oxygen Delivery Method: Circle system utilized Preoxygenation: Pre-oxygenation with 100% oxygen Induction Type: IV induction LMA: LMA inserted LMA Size: 5.0 Number of attempts: 1 Placement Confirmation: positive ETCO2 and breath sounds checked- equal and bilateral Tube secured with: Tape Dental Injury: Teeth and Oropharynx as per pre-operative assessment

## 2022-01-30 NOTE — Anesthesia Postprocedure Evaluation (Signed)
Anesthesia Post Note  Patient: Jason [name]  Procedure(s) Performed: MRI CERVICAL SPINE WITH AND WITOHOUT CONTRAST WITH ANESTHESIA     Patient location during evaluation: PACU Anesthesia Type: General Level of consciousness: sedated Pain management: pain level controlled Vital Signs Assessment: post-procedure vital signs reviewed and stable Respiratory status: spontaneous breathing and respiratory function stable Cardiovascular status: stable Postop Assessment: no apparent nausea or vomiting Anesthetic complications: no  No notable events documented.  Last Vitals:  Vitals:   01/30/22 1020 01/30/22 1035  BP: (!) 140/87 139/80  Pulse: 78 79  Resp: 12 12  Temp:  36.7 C  SpO2: 96% 97%    Last Pain:  Vitals:   01/30/22 1035  TempSrc:   PainSc: 0-No pain                 Samreen Seltzer Dmitriy

## 2022-01-30 NOTE — Transfer of Care (Signed)
Immediate Anesthesia Transfer of Care Note  Patient: Jason [name]  Procedure(s) Performed: MRI CERVICAL SPINE WITH AND WITOHOUT CONTRAST WITH ANESTHESIA  Patient Location: PACU  Anesthesia Type:General  Level of Consciousness: drowsy and patient cooperative  Airway & Oxygen Therapy: Patient Spontanous Breathing  Post-op Assessment: Report given to RN and Post -op Vital signs reviewed and stable  Post vital signs: Reviewed and stable  Last Vitals:  Vitals Value Taken Time  BP 144/90 01/30/22 1005  Temp    Pulse    Resp 10 01/30/22 1007  SpO2    Vitals shown include unvalidated device data.  Last Pain:  Vitals:   01/30/22 0753  TempSrc: Oral  PainSc: 0-No pain         Complications: No notable events documented.

## 2022-01-30 NOTE — Anesthesia Preprocedure Evaluation (Addendum)
Anesthesia Evaluation  Patient identified by MRN, date of birth, ID band Patient awake    Reviewed: Allergy & Precautions, NPO status , Patient's Chart, lab work & pertinent test results  History of Anesthesia Complications Negative for: history of anesthetic complications  Airway Mallampati: III  TM Distance: >3 FB Neck ROM: Full    Dental  (+) Teeth Intact, Dental Advisory Given   Pulmonary neg pulmonary ROS   Pulmonary exam normal        Cardiovascular hypertension, Pt. on home beta blockers and Pt. on medications Normal cardiovascular exam  Echo 06/2020  1. Left ventricular ejection fraction, by estimation, is 50 to 55%. The left ventricle has low normal function. The left ventricle has no regional wall motion abnormalities. There is mild left ventricular hypertrophy. Left ventricular diastolic parameters are consistent with Grade I diastolic dysfunction (impaired relaxation). The average left ventricular global longitudinal strain is -13.2 %. The global longitudinal strain is abnormal.  2. Right ventricular systolic function is low normal. The right ventricular size is normal.  3. The mitral valve is normal in structure. No evidence of mitral valve regurgitation.  4. The aortic valve is tricuspid. Aortic valve regurgitation is not visualized.    LHC 02/2020 1. No angiographically significant coronary artery disease. 2. Normal left ventricular contraction with mildly elevated filling pressures consistent with diastolic dysfunction.  Recommendations: 1. Primary prevention of coronary artery disease. 2. Continue blood pressure control as well as recently added metoprolol for management of incidentally noted PVCs.  If symptoms persist, ambulatory cardiac monitoring +/- echcardiography will need to be considered.     Neuro/Psych negative neurological ROS     GI/Hepatic Neg liver ROS,GERD  ,,  Endo/Other  Hypothyroidism     Renal/GU negative Renal ROS     Musculoskeletal  (+) Arthritis ,    Abdominal   Peds  Hematology negative hematology ROS (+)   Anesthesia Other Findings   Reproductive/Obstetrics                                                              Anesthesia Evaluation  Patient identified by MRN, date of birth, ID band Patient awake    Reviewed: Allergy & Precautions, NPO status , Patient's Chart, lab work & pertinent test results  Airway Mallampati: II  TM Distance: >3 FB Neck ROM: Full    Dental no notable dental hx.    Pulmonary neg pulmonary ROS,    Pulmonary exam normal        Cardiovascular hypertension, Pt. on medications and Pt. on home beta blockers  Rhythm:Regular Rate:Normal     Neuro/Psych Anxiety With claustrophobia, unable to remain still with prior imaging attemptsnegative neurological ROS     GI/Hepatic Neg liver ROS, GERD  ,  Endo/Other  Hypothyroidism   Renal/GU negative Renal ROS     Musculoskeletal  (+) Arthritis , Osteoarthritis,    Abdominal Normal abdominal exam  (+)   Peds  Hematology negative hematology ROS (+)   Anesthesia Other Findings   Reproductive/Obstetrics                           Anesthesia Physical Anesthesia Plan  ASA: 2  Anesthesia Plan: General   Post-op Pain Management:  Induction: Intravenous  PONV Risk Score and Plan: 2 and Ondansetron, Dexamethasone, Midazolam and Treatment may vary due to age or medical condition  Airway Management Planned: Mask and LMA  Additional Equipment: None  Intra-op Plan:   Post-operative Plan: Extubation in OR  Informed Consent: I have reviewed the patients History and Physical, chart, labs and discussed the procedure including the risks, benefits and alternatives for the proposed anesthesia with the patient or authorized representative who has indicated his/her understanding and acceptance.     Dental  advisory given  Plan Discussed with: CRNA  Anesthesia Plan Comments: (PAT note by Karoline Caldwell, PA-C: Patient is undergone thorough cardiology evaluation for chest pain as well as palpitations, PSVT, NSVT last seen by Dr. In 01/24/2021.  Per note, "Symptoms have been quiescent with low-dose metoprolol.  Prior cath showed absence of CAD.  Echo was without significant structural abnormality.  Continue current dose of metoprolol.  No further work-up recommended at this time."  18-monthfollow-up recommended.  Patient will need day of surgery labs and evaluation.  EKG 03/11/2021: Normal sinus rhythm.  Rate 70. Nonspecific ST abnormality  TTE 07/19/2020:  1. Left ventricular ejection fraction, by estimation, is 50 to 55%. The  left ventricle has low normal function. The left ventricle has no regional  wall motion abnormalities. There is mild left ventricular hypertrophy.  Left ventricular diastolic  parameters are consistent with Grade I diastolic dysfunction (impaired  relaxation). The average left ventricular global longitudinal strain is  -13.2 %. The global longitudinal strain is abnormal.   2. Right ventricular systolic function is low normal. The right  ventricular size is normal.   3. The mitral valve is normal in structure. No evidence of mitral valve  regurgitation.   4. The aortic valve is tricuspid. Aortic valve regurgitation is not  visualized.   Event monitor 04/25/2020: . The patient was monitored for 14 days. . The predominant rhythm was sinus with an average rate of 85 bpm (range 47 - 122 bpm in sinus). . There were rare PAC's and occasional PVC's. . There were 6 atrial runs lasting up to 10.8 seconds with a maximum rate of 154 bpm. . There were 9 episodes of wide complex tachycardia lasting up to 8 beats with a maximum rate of 231 bpm. Episodes most likely represent NSVT, though some tracings could also reflect SVT with aberrancy. . No sustained arrhythmia or prolonged pause was  identified. . Patient triggered events correspond to sinus rhythm with PVC's.   Sinus rhythm with rare PAC's and occasional PVC's.  Brief runs of PSVT and NSVT noted, as detailed above.  Cath 03/20/2020: Conclusions: 1. No angiographically significant coronary artery disease. 2. Normal left ventricular contraction with mildly elevated filling pressures consistent with diastolic dysfunction.   Recommendations: 1. Primary prevention of coronary artery disease. 2. Continue blood pressure control as well as recently added metoprolol for management of incidentally noted PVCs.  If symptoms persist, ambulatory cardiac monitoring +/- echcardiography will need to be considered. )      Anesthesia Quick Evaluation  Anesthesia Physical Anesthesia Plan  ASA: 3  Anesthesia Plan: General   Post-op Pain Management: Minimal or no pain anticipated   Induction: Intravenous  PONV Risk Score and Plan: 2 and Dexamethasone, Treatment may vary due to age or medical condition, Ondansetron and Midazolam  Airway Management Planned: LMA  Additional Equipment:   Intra-op Plan:   Post-operative Plan: Extubation in OR  Informed Consent: I have reviewed the patients History and Physical,  chart, labs and discussed the procedure including the risks, benefits and alternatives for the proposed anesthesia with the patient or authorized representative who has indicated his/her understanding and acceptance.     Dental advisory given  Plan Discussed with: Anesthesiologist and CRNA  Anesthesia Plan Comments: (PAT note by Karoline Caldwell, PA-C: Patient recently underwent thorough cardiology evaluationfor chest pain as well as palpitations, PSVT, NSVT.  Workup essentially benign.  Cath with no angiographically significant CAD.  Last seen by Dr. Saunders Revel 08/08/2021.  Per note, stable from cardiac state standpoint, no symptomatic ectopy.  No changes made to management.  11-monthfollow-up recommended.  Patient will  need day of surgery labs and evaluation.  EKG 03/11/2021:Normal sinus rhythm. Rate 70. Nonspecific ST abnormality  TTE 07/19/2020: 1. Left ventricular ejection fraction, by estimation, is 50 to 55%. The  left ventricle has low normal function. The left ventricle has no regional  wall motion abnormalities. There is mild left ventricular hypertrophy.  Left ventricular diastolic  parameters are consistent with Grade I diastolic dysfunction (impaired  relaxation). The average left ventricular global longitudinal strain is  -13.2 %. The global longitudinal strain is abnormal.  2. Right ventricular systolic function is low normal. The right  ventricular size is normal.  3. The mitral valve is normal in structure. No evidence of mitral valve  regurgitation.  4. The aortic valve is tricuspid. Aortic valve regurgitation is not  visualized.   Event monitor 04/25/2020: . The patient was monitored for 14 days. . The predominant rhythm was sinus with an average rate of 85 bpm (range 47 - 122 bpm in sinus). . There were rare PAC's and occasional PVC's. . There were 6 atrial runs lasting up to 10.8 seconds with a maximum rate of 154 bpm. . There were 9 episodes of wide complex tachycardia lasting up to 8 beats with a maximum rate of 231 bpm. Episodes most likely represent NSVT, though some tracings could also reflect SVT with aberrancy. . No sustained arrhythmia or prolonged pause was identified. . Patient triggered events correspond to sinus rhythm with PVC's.  Sinus rhythm with rare PAC's and occasional PVC's. Brief runs of PSVT and NSVT noted, as detailed above.  Cath 03/20/2020: Conclusions: 1. No angiographically significant coronary artery disease. 2. Normal left ventricular contraction with mildly elevated filling pressures consistent with diastolic dysfunction.  Recommendations: 1. Primary prevention of coronary artery disease. 2. Continue blood pressure control as well as  recently added metoprolol for management of incidentally noted PVCs. If symptoms persist, ambulatory cardiac monitoring +/- echcardiography will need to be considered.  )        Anesthesia Quick Evaluation

## 2022-01-31 ENCOUNTER — Telehealth: Payer: Self-pay | Admitting: Internal Medicine

## 2022-01-31 ENCOUNTER — Encounter (HOSPITAL_COMMUNITY): Payer: Self-pay | Admitting: Radiology

## 2022-01-31 DIAGNOSIS — N400 Enlarged prostate without lower urinary tract symptoms: Secondary | ICD-10-CM | POA: Diagnosis not present

## 2022-01-31 DIAGNOSIS — I10 Essential (primary) hypertension: Secondary | ICD-10-CM | POA: Diagnosis not present

## 2022-01-31 DIAGNOSIS — I200 Unstable angina: Secondary | ICD-10-CM | POA: Diagnosis not present

## 2022-01-31 DIAGNOSIS — I959 Hypotension, unspecified: Secondary | ICD-10-CM | POA: Diagnosis not present

## 2022-01-31 DIAGNOSIS — K219 Gastro-esophageal reflux disease without esophagitis: Secondary | ICD-10-CM | POA: Diagnosis not present

## 2022-01-31 DIAGNOSIS — E039 Hypothyroidism, unspecified: Secondary | ICD-10-CM | POA: Diagnosis not present

## 2022-01-31 DIAGNOSIS — C7951 Secondary malignant neoplasm of bone: Secondary | ICD-10-CM | POA: Diagnosis not present

## 2022-01-31 DIAGNOSIS — M1990 Unspecified osteoarthritis, unspecified site: Secondary | ICD-10-CM | POA: Diagnosis not present

## 2022-01-31 DIAGNOSIS — J9692 Respiratory failure, unspecified with hypercapnia: Secondary | ICD-10-CM | POA: Diagnosis not present

## 2022-01-31 DIAGNOSIS — D509 Iron deficiency anemia, unspecified: Secondary | ICD-10-CM | POA: Diagnosis not present

## 2022-01-31 DIAGNOSIS — J9691 Respiratory failure, unspecified with hypoxia: Secondary | ICD-10-CM | POA: Diagnosis not present

## 2022-01-31 DIAGNOSIS — G8254 Quadriplegia, C5-C7 incomplete: Secondary | ICD-10-CM | POA: Diagnosis not present

## 2022-01-31 DIAGNOSIS — C7940 Secondary malignant neoplasm of unspecified part of nervous system: Secondary | ICD-10-CM | POA: Diagnosis not present

## 2022-01-31 DIAGNOSIS — D630 Anemia in neoplastic disease: Secondary | ICD-10-CM | POA: Diagnosis not present

## 2022-01-31 DIAGNOSIS — F419 Anxiety disorder, unspecified: Secondary | ICD-10-CM | POA: Diagnosis not present

## 2022-01-31 DIAGNOSIS — C61 Malignant neoplasm of prostate: Secondary | ICD-10-CM | POA: Diagnosis not present

## 2022-01-31 NOTE — Telephone Encounter (Signed)
Home Health verbal orders Caller Name: [place] Name: [facility]  Callback number: [phone], secured   Requesting OT/PT/Skilled nursing/Social Work/Speech: ot   Reason:  Frequency: one x eight   Please forward to [facility] pool or providers CMA

## 2022-02-03 ENCOUNTER — Encounter: Payer: Self-pay | Admitting: Neurosurgery

## 2022-02-03 NOTE — Telephone Encounter (Signed)
Verbal order left on verified VM.

## 2022-02-05 ENCOUNTER — Encounter: Payer: Self-pay | Admitting: Internal Medicine

## 2022-02-05 ENCOUNTER — Ambulatory Visit: Payer: BC Managed Care – PPO | Admitting: Internal Medicine

## 2022-02-05 VITALS — BP 138/72 | HR 66 | Resp 12

## 2022-02-05 DIAGNOSIS — N319 Neuromuscular dysfunction of bladder, unspecified: Secondary | ICD-10-CM

## 2022-02-05 DIAGNOSIS — K592 Neurogenic bowel, not elsewhere classified: Secondary | ICD-10-CM | POA: Diagnosis not present

## 2022-02-05 DIAGNOSIS — G8250 Quadriplegia, unspecified: Secondary | ICD-10-CM | POA: Diagnosis not present

## 2022-02-05 DIAGNOSIS — C7940 Secondary malignant neoplasm of unspecified part of nervous system: Secondary | ICD-10-CM | POA: Diagnosis not present

## 2022-02-05 DIAGNOSIS — F419 Anxiety disorder, unspecified: Secondary | ICD-10-CM | POA: Diagnosis not present

## 2022-02-05 DIAGNOSIS — I6611 Occlusion and stenosis of right anterior cerebral artery: Secondary | ICD-10-CM | POA: Diagnosis not present

## 2022-02-05 DIAGNOSIS — C7951 Secondary malignant neoplasm of bone: Secondary | ICD-10-CM | POA: Diagnosis not present

## 2022-02-05 DIAGNOSIS — I959 Hypotension, unspecified: Secondary | ICD-10-CM | POA: Diagnosis not present

## 2022-02-05 DIAGNOSIS — E039 Hypothyroidism, unspecified: Secondary | ICD-10-CM | POA: Diagnosis not present

## 2022-02-05 DIAGNOSIS — D630 Anemia in neoplastic disease: Secondary | ICD-10-CM | POA: Diagnosis not present

## 2022-02-05 DIAGNOSIS — M1990 Unspecified osteoarthritis, unspecified site: Secondary | ICD-10-CM | POA: Diagnosis not present

## 2022-02-05 DIAGNOSIS — G8254 Quadriplegia, C5-C7 incomplete: Secondary | ICD-10-CM | POA: Diagnosis not present

## 2022-02-05 DIAGNOSIS — K219 Gastro-esophageal reflux disease without esophagitis: Secondary | ICD-10-CM | POA: Diagnosis not present

## 2022-02-05 DIAGNOSIS — N400 Enlarged prostate without lower urinary tract symptoms: Secondary | ICD-10-CM | POA: Diagnosis not present

## 2022-02-05 DIAGNOSIS — M4802 Spinal stenosis, cervical region: Secondary | ICD-10-CM | POA: Diagnosis not present

## 2022-02-05 DIAGNOSIS — C61 Malignant neoplasm of prostate: Secondary | ICD-10-CM

## 2022-02-05 DIAGNOSIS — I10 Essential (primary) hypertension: Secondary | ICD-10-CM | POA: Diagnosis not present

## 2022-02-05 DIAGNOSIS — F39 Unspecified mood [affective] disorder: Secondary | ICD-10-CM

## 2022-02-05 DIAGNOSIS — D509 Iron deficiency anemia, unspecified: Secondary | ICD-10-CM | POA: Diagnosis not present

## 2022-02-05 MED ORDER — SENNA-DOCUSATE SODIUM 8.6-50 MG PO TABS: 2.0000 | ORAL_TABLET | Freq: Every day | ORAL | 0 refills | Status: DC

## 2022-02-05 MED ORDER — FLUOXETINE HCL 20 MG PO CAPS: 20.0000 mg | ORAL_CAPSULE | Freq: Every day | ORAL | 3 refills | Status: DC

## 2022-02-05 NOTE — Progress Notes (Signed)
Subjective:    Patient ID: Jason [name], male    DOB: [birth year], 66 y.o.   MRN: [id number]  HPI Home visit for follow up of disabling illness  Was hospitalized for UTI Catheter replaced by the urologist since then Working okay since then--still gets some sense of pressure Doesn't feel sick--no recent fever  He can shift position in chair now Slow increase in strength Working on core in chair Using pulley to work on arms---right is better than left Does eat and do some personal care with right hand Wife transfers him with [name] and someone to help---neighbor also comes in the morning to help with transfer  Only pain is in left shoulder--mild Tylenol helps--but doesn't even need that all that much  Now on xtandi It seems to make him tired Wife notes some "brain fog"  Current Outpatient Medications on File Prior to Visit  Medication Sig Dispense Refill   acetaminophen (TYLENOL) 325 MG tablet Take 1-2 tablets (325-650 mg total) by mouth every 4 (four) hours as needed for mild pain.     ascorbic acid (VITAMIN C) 500 MG tablet Take 1 tablet (500 mg total) by mouth daily. 30 tablet 0   aspirin EC 81 MG tablet Take 1 tablet (81 mg total) by mouth daily. Swallow whole. (Patient taking differently: Take 162 mg by mouth daily. Swallow whole.) 30 tablet 12   atorvastatin (LIPITOR) 40 MG tablet Take 1 tablet (40 mg total) by mouth daily. 90 tablet 3   bisacodyl (DULCOLAX) 10 MG suppository Place 1 suppository (10 mg total) rectally daily as needed for moderate constipation. (Patient taking differently: Place 10 mg rectally daily.) 12 suppository 0   cetirizine (ZYRTEC) 10 MG tablet Take 10 mg by mouth at bedtime.     clonazePAM (KLONOPIN) 0.5 MG tablet TAKE 1 TABLET BY MOUTH 2 TIMES DAILY. 60 tablet 0   docusate sodium (COLACE) 100 MG capsule Take 2 capsules (200 mg total) by mouth daily. (Patient taking differently: Take 100 mg by mouth 2 (two) times daily.) 10 capsule 0   enzalutamide  (XTANDI) 40 MG tablet Take 4 tablets (160 mg total) by mouth daily. 120 tablet 1   famotidine (PEPCID) 20 MG tablet Take 1 tablet (20 mg total) by mouth daily. 30 tablet 0   FLUoxetine (PROZAC) 20 MG capsule Take 1 capsule (20 mg total) by mouth at bedtime. 30 capsule 0   fluticasone (FLONASE) 50 MCG/ACT nasal spray Place 1 spray into both nostrils daily. 16 g 0   iron polysaccharides (NIFEREX) 150 MG capsule Take 150 mg by mouth daily.     levothyroxine (SYNTHROID) 88 MCG tablet Take 1 tablet (88 mcg total) by mouth daily. 30 tablet 0   Multiple Vitamin (MULTIVITAMIN) tablet Take 1 tablet by mouth daily.     potassium chloride (KLOR-CON M) 10 MEQ tablet Take 1 tablet (10 mEq total) by mouth at bedtime. 30 tablet 0   tamsulosin (FLOMAX) 0.4 MG CAPS capsule Take 1 capsule (0.4 mg total) by mouth daily after supper. 30 capsule 0   No current facility-administered medications on file prior to visit.    No Known Allergies  Past Medical History:  Diagnosis Date   Allergy    Arthritis    osteoarthritis   GERD (gastroesophageal reflux disease)    History of kidney stones    Hypertension    Hypothyroidism    Thyroid disease    Hyperthyroidism s/p radioactive iodine ablation    Past Surgical History:  Procedure  Laterality Date   CARDIAC CATHETERIZATION  03/2000   HERNIA REPAIR  0932   umbilical   LEFT HEART CATH AND CORONARY ANGIOGRAPHY Left 03/20/2020   Procedure: LEFT HEART CATH AND CORONARY ANGIOGRAPHY;  Surgeon: Nelva Bush, MD;  Location: [facility];  Service: Cardiovascular;  Laterality: Left;   POSTERIOR CERVICAL FUSION/FORAMINOTOMY N/A 10/06/2021   Procedure: POSTERIOR CERVICAL FUSION/ FORAMINOTOMY LEVEL 3;  Surgeon: Meade Maw, MD;  Location: [facility];  Service: Neurosurgery;  Laterality: N/A;  will need monitoring   RADIOLOGY WITH ANESTHESIA Left 04/18/2021   Procedure: MRI SHOULDER WITHOUT CONTRAST WITH ANESTHESIA;  Surgeon: Radiologist, Medication, MD;   Location: [facility];  Service: Radiology;  Laterality: Left;   RADIOLOGY WITH ANESTHESIA N/A 10/03/2021   Procedure: MRI CERVICAL SPINE WITH ANESTHESIA;  Surgeon: Radiologist, Medication, MD;  Location: [facility];  Service: Radiology;  Laterality: N/A;   RADIOLOGY WITH ANESTHESIA N/A 01/30/2022   Procedure: MRI CERVICAL SPINE WITH AND WITOHOUT CONTRAST WITH ANESTHESIA;  Surgeon: Radiologist, Medication, MD;  Location: [facility];  Service: Radiology;  Laterality: N/A;   REVERSE SHOULDER ARTHROPLASTY Left    ROTATOR CUFF REPAIR  11/2010   Dr [name] Heck   TONSILLECTOMY     TOTAL HIP ARTHROPLASTY Right 02/24/2009   TOTAL HIP ARTHROPLASTY Left 06/2016   Dr Harlow Mares    Family History  Problem Relation Age of Onset   Hypertension Mother    Stroke Mother    Gout Father    Heart attack Father 14   Heart disease Sister    Lung cancer Sister    Drug abuse Sister    Breast cancer Paternal Aunt    Lung cancer Maternal Grandmother    Colon cancer Neg Hx    Esophageal cancer Neg Hx    Rectal cancer Neg Hx    Stomach cancer Neg Hx     Social History   Socioeconomic History   Marital status: Married    Spouse name: [name]    Number of children: 0   Years of education: Not on file   Highest education level: Not on file  Occupational History   Occupation: Therapist, occupational: [employer]  Tobacco Use   Smoking status: Never    Passive exposure: Never   Smokeless tobacco: Never  Vaping Use   Vaping Use: Never used  Substance and Sexual Activity   Alcohol use: Not Currently    Comment: Alcohol once every few weeks/months   Drug use: No   Sexual activity: Yes  Other Topics Concern   Not on file  Social History Narrative   Lives at home with spouse.    Social Determinants of Health   Financial Resource Strain: Not on file  Food Insecurity: Not on file  Transportation Needs: Not on file  Physical Activity: Not on file  Stress: Not on file  Social Connections: Not on file  Intimate  Partner Violence: Not on file   Review of Systems Using the suppository every night--but still bowels aren't regular. Has even gone in the bed without realizing. He has been eating well Sleeping okay---falls asleep right away after getting in bed    Objective:   Physical Exam Constitutional:      Comments: In power chair  Cardiovascular:     Rate and Rhythm: Normal rate and regular rhythm.     Heart sounds: No murmur heard.    No gallop.  Pulmonary:     Effort: Pulmonary effort is normal.  Breath sounds: No wheezing or rales.     Comments: Slight I/E rhonchi Not tight Abdominal:     Palpations: Abdomen is soft.     Tenderness: There is no abdominal tenderness.  Musculoskeletal:     Cervical back: Neck supple.     Right lower leg: No edema.     Left lower leg: No edema.  Lymphadenopathy:     Cervical: No cervical adenopathy.  Neurological:     Mental Status: He is alert.     Sensory: Sensory deficit present.     Comments: No movement in legs Moderate 3/5 strength in right hand--has good grip Minimal left arm/hand use            Assessment & Plan:

## 2022-02-05 NOTE — Assessment & Plan Note (Signed)
Is staying upbeat and has improved some Will continue the fluoxetine 20

## 2022-02-05 NOTE — Assessment & Plan Note (Signed)
Now on xtandi and going back to oncology in 2 days

## 2022-02-05 NOTE — Assessment & Plan Note (Signed)
Foley for now It would be preferable for him to have the foley out but wife can't change him when in the power chair. Might be able to consider condom cath

## 2022-02-05 NOTE — Assessment & Plan Note (Addendum)
Asked them to change the docusate to senna -s 2 daily Continue the nightly suppository Will stop the niferex since he has been on it for 4 months and is probably iron replete

## 2022-02-05 NOTE — Assessment & Plan Note (Signed)
Continues on therapy but still minimal truncal movement Wife still needs assistance transferring him in [place] lift

## 2022-02-06 DIAGNOSIS — C7951 Secondary malignant neoplasm of bone: Secondary | ICD-10-CM | POA: Diagnosis not present

## 2022-02-06 DIAGNOSIS — D630 Anemia in neoplastic disease: Secondary | ICD-10-CM | POA: Diagnosis not present

## 2022-02-06 DIAGNOSIS — I6611 Occlusion and stenosis of right anterior cerebral artery: Secondary | ICD-10-CM | POA: Diagnosis not present

## 2022-02-06 DIAGNOSIS — K219 Gastro-esophageal reflux disease without esophagitis: Secondary | ICD-10-CM | POA: Diagnosis not present

## 2022-02-06 DIAGNOSIS — M1990 Unspecified osteoarthritis, unspecified site: Secondary | ICD-10-CM | POA: Diagnosis not present

## 2022-02-06 DIAGNOSIS — E039 Hypothyroidism, unspecified: Secondary | ICD-10-CM | POA: Diagnosis not present

## 2022-02-06 DIAGNOSIS — N319 Neuromuscular dysfunction of bladder, unspecified: Secondary | ICD-10-CM | POA: Diagnosis not present

## 2022-02-06 DIAGNOSIS — C7940 Secondary malignant neoplasm of unspecified part of nervous system: Secondary | ICD-10-CM | POA: Diagnosis not present

## 2022-02-06 DIAGNOSIS — N400 Enlarged prostate without lower urinary tract symptoms: Secondary | ICD-10-CM | POA: Diagnosis not present

## 2022-02-06 DIAGNOSIS — D509 Iron deficiency anemia, unspecified: Secondary | ICD-10-CM | POA: Diagnosis not present

## 2022-02-06 DIAGNOSIS — F419 Anxiety disorder, unspecified: Secondary | ICD-10-CM | POA: Diagnosis not present

## 2022-02-06 DIAGNOSIS — G8254 Quadriplegia, C5-C7 incomplete: Secondary | ICD-10-CM | POA: Diagnosis not present

## 2022-02-06 DIAGNOSIS — C61 Malignant neoplasm of prostate: Secondary | ICD-10-CM | POA: Diagnosis not present

## 2022-02-06 DIAGNOSIS — M4802 Spinal stenosis, cervical region: Secondary | ICD-10-CM | POA: Diagnosis not present

## 2022-02-06 DIAGNOSIS — I959 Hypotension, unspecified: Secondary | ICD-10-CM | POA: Diagnosis not present

## 2022-02-06 DIAGNOSIS — I10 Essential (primary) hypertension: Secondary | ICD-10-CM | POA: Diagnosis not present

## 2022-02-06 MED ORDER — SENNA-DOCUSATE SODIUM 8.6-50 MG PO TABS: 2.0000 | ORAL_TABLET | Freq: Every day | ORAL | 11 refills | Status: DC

## 2022-02-07 ENCOUNTER — Inpatient Hospital Stay (HOSPITAL_BASED_OUTPATIENT_CLINIC_OR_DEPARTMENT_OTHER): Payer: BC Managed Care – PPO | Admitting: Internal Medicine

## 2022-02-07 ENCOUNTER — Inpatient Hospital Stay (HOSPITAL_BASED_OUTPATIENT_CLINIC_OR_DEPARTMENT_OTHER): Payer: BC Managed Care – PPO | Admitting: Oncology

## 2022-02-07 ENCOUNTER — Encounter: Payer: Self-pay | Admitting: Oncology

## 2022-02-07 ENCOUNTER — Inpatient Hospital Stay: Payer: BC Managed Care – PPO

## 2022-02-07 ENCOUNTER — Inpatient Hospital Stay: Payer: BC Managed Care – PPO | Attending: Anatomic Pathology & Clinical Pathology

## 2022-02-07 ENCOUNTER — Other Ambulatory Visit: Payer: Self-pay

## 2022-02-07 ENCOUNTER — Encounter: Payer: Self-pay | Admitting: Internal Medicine

## 2022-02-07 VITALS — BP 148/92 | HR 97 | Resp 18 | Wt 179.0 lb

## 2022-02-07 VITALS — BP 148/92 | HR 97 | Temp 98.6°F | Resp 20 | Wt 179.0 lb

## 2022-02-07 DIAGNOSIS — G8250 Quadriplegia, unspecified: Secondary | ICD-10-CM | POA: Diagnosis not present

## 2022-02-07 DIAGNOSIS — Z801 Family history of malignant neoplasm of trachea, bronchus and lung: Secondary | ICD-10-CM | POA: Insufficient documentation

## 2022-02-07 DIAGNOSIS — N319 Neuromuscular dysfunction of bladder, unspecified: Secondary | ICD-10-CM | POA: Diagnosis not present

## 2022-02-07 DIAGNOSIS — C7951 Secondary malignant neoplasm of bone: Secondary | ICD-10-CM | POA: Diagnosis not present

## 2022-02-07 DIAGNOSIS — E039 Hypothyroidism, unspecified: Secondary | ICD-10-CM | POA: Insufficient documentation

## 2022-02-07 DIAGNOSIS — Z7982 Long term (current) use of aspirin: Secondary | ICD-10-CM | POA: Diagnosis not present

## 2022-02-07 DIAGNOSIS — I10 Essential (primary) hypertension: Secondary | ICD-10-CM | POA: Insufficient documentation

## 2022-02-07 DIAGNOSIS — Z86718 Personal history of other venous thrombosis and embolism: Secondary | ICD-10-CM | POA: Diagnosis not present

## 2022-02-07 DIAGNOSIS — C61 Malignant neoplasm of prostate: Secondary | ICD-10-CM | POA: Insufficient documentation

## 2022-02-07 DIAGNOSIS — Z79818 Long term (current) use of other agents affecting estrogen receptors and estrogen levels: Secondary | ICD-10-CM | POA: Diagnosis not present

## 2022-02-07 DIAGNOSIS — Z79899 Other long term (current) drug therapy: Secondary | ICD-10-CM | POA: Diagnosis not present

## 2022-02-07 DIAGNOSIS — C7940 Secondary malignant neoplasm of unspecified part of nervous system: Secondary | ICD-10-CM | POA: Insufficient documentation

## 2022-02-07 DIAGNOSIS — Z5111 Encounter for antineoplastic chemotherapy: Secondary | ICD-10-CM | POA: Insufficient documentation

## 2022-02-07 DIAGNOSIS — Z803 Family history of malignant neoplasm of breast: Secondary | ICD-10-CM | POA: Diagnosis not present

## 2022-02-07 LAB — IRON AND TIBC
Iron: 64 ug/dL (ref 45–182)
Saturation Ratios: 20 % (ref 17.9–39.5)
TIBC: 314 ug/dL (ref 250–450)
UIBC: 250 ug/dL

## 2022-02-07 LAB — COMPREHENSIVE METABOLIC PANEL
ALT: 27 U/L (ref 0–44)
AST: 36 U/L (ref 15–41)
Albumin: 3.8 g/dL (ref 3.5–5.0)
Alkaline Phosphatase: 142 U/L — ABNORMAL HIGH (ref 38–126)
Anion gap: 13 (ref 5–15)
BUN: 10 mg/dL (ref 8–23)
CO2: 26 mmol/L (ref 22–32)
Calcium: 9.1 mg/dL (ref 8.9–10.3)
Chloride: 100 mmol/L (ref 98–111)
Creatinine, Ser: 0.46 mg/dL — ABNORMAL LOW (ref 0.61–1.24)
GFR, Estimated: >60 mL/min (ref >60)
Glucose, Bld: 109 mg/dL — ABNORMAL HIGH (ref 70–99)
Potassium: 3.6 mmol/L (ref 3.5–5.1)
Sodium: 139 mmol/L (ref 135–145)
Total Bilirubin: 1 mg/dL (ref 0.3–1.2)
Total Protein: 7.2 g/dL (ref 6.5–8.1)

## 2022-02-07 LAB — CBC WITH DIFFERENTIAL/PLATELET
Abs Immature Granulocytes: 0.01 10*3/uL (ref 0.00–0.07)
Basophils Absolute: 0.1 10*3/uL (ref 0.0–0.1)
Basophils Relative: 1 %
Eosinophils Absolute: 0.1 10*3/uL (ref 0.0–0.5)
Eosinophils Relative: 2 %
HCT: 42.6 % (ref 39.0–52.0)
Hemoglobin: 14.1 g/dL (ref 13.0–17.0)
Immature Granulocytes: 0 %
Lymphocytes Relative: 15 %
Lymphs Abs: 0.9 10*3/uL (ref 0.7–4.0)
MCH: 30.1 pg (ref 26.0–34.0)
MCHC: 33.1 g/dL (ref 30.0–36.0)
MCV: 91 fL (ref 80.0–100.0)
Monocytes Absolute: 0.3 10*3/uL (ref 0.1–1.0)
Monocytes Relative: 6 %
Neutro Abs: 4.6 10*3/uL (ref 1.7–7.7)
Neutrophils Relative %: 76 %
Platelets: 268 10*3/uL (ref 150–400)
RBC: 4.68 MIL/uL (ref 4.22–5.81)
RDW: 15.1 % (ref 11.5–15.5)
WBC: 6 10*3/uL (ref 4.0–10.5)
nRBC: 0 % (ref 0.0–0.2)

## 2022-02-07 LAB — RETIC PANEL
Immature Retic Fract: 6.7 % (ref 2.3–15.9)
RBC.: 4.72 MIL/uL (ref 4.22–5.81)
Retic Count, Absolute: 61.8 10*3/uL (ref 19.0–186.0)
Retic Ct Pct: 1.3 % (ref 0.4–3.1)
Reticulocyte Hemoglobin: 34.2 pg (ref >27.9)

## 2022-02-07 LAB — FERRITIN: Ferritin: 190 ng/mL (ref 24–336)

## 2022-02-07 MED ORDER — LEUPROLIDE ACETATE (6 MONTH) 45 MG ~~LOC~~ KIT
45.0000 mg | PACK | Freq: Once | SUBCUTANEOUS | Status: AC
  Administered 2022-02-07: 45 mg via SUBCUTANEOUS
  Filled 2022-02-07: qty 45

## 2022-02-07 NOTE — Assessment & Plan Note (Addendum)
Established care with neurology Dr.Vaslow today Repeat MRI spine.  Continue physical therapy.

## 2022-02-07 NOTE — Progress Notes (Signed)
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 Currently on chemoprevention therapy with Eligard.  Status post palliative radiation to spine. PMSA PET scan showed partial response. Patient is not a candidate for chemotherapy with docetaxel. Labs are reviewed and discussed with patient.  He tolerates Xtandi 160 mg daily, continue.   History of deep vein thrombosis (DVT) of lower extremity Per Dr.Lovorn Megan's note, patient developed left gastrocnemius vein DVT 8/26.  Provoked by immobilization.Repeat ultrasound showed no blood clots. Discussed with patient that he may develop recurrent DVT due to immobility secondary to paralysis.   off Eliquis due to interaction with Xtandi Continue Aspirin 162 mg daily for prophylaxis.  Androgen deprivation therapy Proceed with Eligard '45mg'$  today  Neurogenic bladder Continue follow up with urology  Chronic incomplete quadriplegia [facility]) Established care with neurology Dr.Vaslow today Repeat MRI spine.  Continue physical therapy.   Iron panel showed normal iron level. He may stop oral iron supplementation.   Orders Placed This Encounter  Procedures   CBC with Differential/Platelet    Standing Status:   Future    Standing Expiration Date:   02/08/2023   Comprehensive metabolic panel    Standing Status:   Future    Standing Expiration Date:   02/07/2023   PSA    Standing Status:   Future    Standing Expiration Date:   02/07/2023   Ferritin    Standing Status:   Future    Number of Occurrences:   1    Standing Expiration Date:    02/08/2023   Iron and TIBC    Standing Status:   Future    Number of Occurrences:   1    Standing Expiration Date:   02/08/2023   Retic Panel    Standing Status:   Future    Number of Occurrences:   1    Standing Expiration Date:   02/08/2023   Follow up 4 weeks   All questions were answered. The patient knows to call the clinic with any problems, questions or concerns.  Earlie Server, MD, PhD [facility] Hematology Oncology 02/07/2022      CHIEF COMPLAINTS/PURPOSE OF CONSULTATION:  Metastatic prostate cancer   HISTORY OF PRESENTING ILLNESS:  Jason [name] 66 y.o. male presents to establish care for metastatic prostate cancer I have reviewed his chart and materials related to his cancer extensively and collaborated history with the patient. 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  Associated epidural disease greater at the C6 where there is marked canal stenosis without cord compression.  Left greater than right foraminal effacement due to the extraosseous disease.   10/03/2021 Imaging   MRI left shoulder showed High-riding humeral head with massive full-thickness tear of the entire supraspinatus tendon and the anterior 50% of the infraspinatus tendon. Additional partial-thickness articular sided tearing of the posterior infraspinatus. Moderate to high-grade supraspinatus and infraspinatus muscle atrophy suggests these tears  are chronic.  Partial-thickness tearing of the superior greater than inferior  aspects of the subscapularis tendon footprint. Mild subscapularis muscle atrophy.  Moderate degenerative changes of the acromioclavicular joint. Moderate glenohumeral cartilage degenerative changes.   10/05/2021 Imaging   CT chest abdomen  pelvis with contrast showed  Moderate severity retroperitoneal and pelvic lymphadenopathy,consistent with metastatic disease. Small lytic areas at the levels of L4 and S1, with diffusely sclerotic changes involving the second and fourth right ribs, sacrum and left iliac bone. 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  Sigmoid diverticulosis. Aortic atherosclerosis   10/05/2021 Imaging   CT cervical spine without contrast Extensive heterogeneous sclerotic lesions throughout the cervical spine compatible with metastatic disease to the bone.  Mixed sclerotic and lytic changes in the posterior elements at C5 and C6.Extraosseous tumor at C5 and C6 is better appreciated on the MRI scan.   10/05/2021 Tumor Marker   PSA >1500   10/06/2021 Cancer Staging   Staging form: Prostate, AJCC 8th Edition - Clinical stage from 10/06/2021: Stage IVB (cN1, pM1c, PSA: 1500) - Signed by Earlie Server, MD on 12/04/2021 Stage prefix: Initial diagnosis Prostate specific antigen (PSA) range: 20 or greater   10/08/2021 Imaging   MRI thoracic spine and the lumbar spine with and without contrast 1. 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 2. 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 3. Additional epidural tumor along the posterior endplates at U9,W11, and T12 without high-grade spinal canal stenosis or cord compression. 4. Extensive epidural tumor in the lumbar spine is greatest in thickness at L1 without high-grade spinal canal stenosis at this level; however, superimposed on pre-existing degenerative changes lower in the lumbar spine results  in severe spinal canal stenosis with cauda equina nerve root compression at L3-L4 and L4-L5. Multilevel neural foraminal stenosis is detailed above. 5. Postsurgical changes reflecting C6 laminectomy unchanged spinal canal stenosis compared to the postoperative study from 1 day prior.No definite cord signal abnormality. 6. Extramedullary fluid collection along the dorsal aspect of the cord beginning at T9-T10 extending into the lumbar spine is likely subdural in location but is of uncertain etiology; evolving blood products not excluded. The collection anteriorly displaces the cord and cauda equina nerve roots without frank cord compression or signal abnormality. There is no peripheral enhancement to suggest abscess. 7. Prevertebral edema in the cervical spine which may be postsurgical in nature. 8. New bilateral lower lobe consolidations may reflect atelectasis or developing pneumonia/aspiration.   10/08/2021 Imaging   MRI brain with and without contrast showed 1. 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 There is intracranial extension of tumor along the right frontal convexity measuring up to 5 mm in thickness without mass effect on the underlying brain parenchyma or midline shift. Extracranial extension of tumor in this location measures up to approximately 3 mm. 2. Additional extracranial extension of tumor into the scalp along the vertex measuring up to 7 mm in thickness. 3. Small focus of diffusion restriction in the right parietal cortex most in keeping with a small acute infarct. 4. No evidence of parenchymal metastatic disease.   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 10/06/2021, patient underwent C6 cervical laminectomy and resection of tumor.  Pathology came back positive for metastatic prostate cancer. 8/14 developed significant  bilateral upper and lower extremity weakness with hypotension requiring transfer to the ICU, pressors, steroid, respiratory failure on mechanical ventilation. Later patient was stabilized and extubated.  8/16 Firmagon loading dose '240mg'$   9/15 Eligard 22.'5mg'$  given inpatient during his rehab admission.     10/2021 -  Radiation Therapy   Palliative radiation to spine.    12/04/2021 Tumor Marker   PSA 67.09   01/13/2022 Tumor Marker   PSA 43.95   01/23/2022 -  Chemotherapy   Started on Xtandi '160mg'$  daily.     INTERVAL HISTORY Jason [name] is a 66 y.o. male who has above history reviewed by me today presents for follow up visit for metastatic prostate cancer. Patient was accompanied by wife.  Patient has neurogenic bladder and has indwelling Foley catheter. Patient has chronic bilateral lower extremity extremity weakness, generalized left upper extremity weakness.  He gets physical therapy.Buddy Duty on Xtandi '160mg'$  daily.overall tolerates well, except feeling more fatigued.  BP today 148/92   MEDICAL HISTORY:  Past Medical History:  Diagnosis Date   Allergy    Arthritis    osteoarthritis   GERD (gastroesophageal reflux disease)    History of kidney stones    Hypertension    Hypothyroidism    Thyroid disease    Hyperthyroidism s/p radioactive iodine ablation    SURGICAL HISTORY: Past Surgical History:  Procedure Laterality Date   CARDIAC CATHETERIZATION  03/2000   HERNIA REPAIR  [id number]   umbilical   LEFT HEART CATH AND CORONARY ANGIOGRAPHY Left 03/20/2020   Procedure: LEFT HEART CATH AND CORONARY ANGIOGRAPHY;  Surgeon: Nelva Bush, MD;  Location: [facility] [place] [facility];  Service: Cardiovascular;  Laterality: Left;   POSTERIOR CERVICAL FUSION/FORAMINOTOMY N/A 10/06/2021   Procedure: POSTERIOR CERVICAL FUSION/ FORAMINOTOMY LEVEL 3;  Surgeon: Meade Maw, MD;  Location: [facility];  Service: Neurosurgery;  Laterality: N/A;  will need monitoring   RADIOLOGY WITH  ANESTHESIA Left 04/18/2021   Procedure: MRI SHOULDER WITHOUT CONTRAST WITH ANESTHESIA;  Surgeon: Radiologist, Medication, MD;  Location: [place];  Service: Radiology;  Laterality: Left;   RADIOLOGY WITH ANESTHESIA N/A 10/03/2021   Procedure: MRI CERVICAL SPINE WITH ANESTHESIA;  Surgeon: Radiologist, Medication, MD;  Location: [place];  Service: Radiology;  Laterality: N/A;   RADIOLOGY WITH ANESTHESIA N/A 01/30/2022   Procedure: MRI CERVICAL SPINE WITH AND WITOHOUT CONTRAST WITH ANESTHESIA;  Surgeon: Radiologist, Medication, MD;  Location: [place];  Service: Radiology;  Laterality: N/A;   REVERSE SHOULDER ARTHROPLASTY Left    ROTATOR CUFF REPAIR  11/2010   Dr [name] Heck   TONSILLECTOMY     TOTAL HIP ARTHROPLASTY Right 02/24/2009   TOTAL HIP ARTHROPLASTY Left 06/2016   Dr Harlow Mares    SOCIAL HISTORY: Social History   Socioeconomic History   Marital status: Married    Spouse name: [name]    Number of children: 0   Years of education: Not on file   Highest education level: Not on file  Occupational History   Occupation: Therapist, occupational: [employer]  Tobacco Use   Smoking status: Never    Passive exposure: Never   Smokeless tobacco: Never  Vaping Use   Vaping Use: Never used  Substance and Sexual Activity   Alcohol use: Not Currently    Comment: Alcohol once every few weeks/months   Drug use: No  Sexual activity: Yes  Other Topics Concern   Not on file  Social History Narrative   Lives at home with spouse.    Social Determinants of Health   Financial Resource Strain: Not on file  Food Insecurity: Not on file  Transportation Needs: Not on file  Physical Activity: Not on file  Stress: Not on file  Social Connections: Not on file  Intimate Partner Violence: Not on file    FAMILY HISTORY: Family History  Problem Relation Age of Onset   Hypertension Mother    Stroke Mother    Gout Father    Heart attack Father 36   Heart disease Sister    Lung cancer Sister    Drug  abuse Sister    Breast cancer Paternal Aunt    Lung cancer Maternal Grandmother    Colon cancer Neg Hx    Esophageal cancer Neg Hx    Rectal cancer Neg Hx    Stomach cancer Neg Hx     ALLERGIES:  has No Known Allergies.  MEDICATIONS:  Current Outpatient Medications  Medication Sig Dispense Refill   acetaminophen (TYLENOL) 325 MG tablet Take 1-2 tablets (325-650 mg total) by mouth every 4 (four) hours as needed for mild pain.     ascorbic acid (VITAMIN C) 500 MG tablet Take 1 tablet (500 mg total) by mouth daily. 30 tablet 0   aspirin EC 81 MG tablet Take 1 tablet (81 mg total) by mouth daily. Swallow whole. (Patient taking differently: Take 162 mg by mouth daily. Swallow whole.) 30 tablet 12   atorvastatin (LIPITOR) 40 MG tablet Take 1 tablet (40 mg total) by mouth daily. 90 tablet 3   bisacodyl (DULCOLAX) 10 MG suppository Place 1 suppository (10 mg total) rectally daily as needed for moderate constipation. (Patient taking differently: Place 10 mg rectally daily.) 12 suppository 0   cetirizine (ZYRTEC) 10 MG tablet Take 10 mg by mouth at bedtime.     clonazePAM (KLONOPIN) 0.5 MG tablet TAKE 1 TABLET BY MOUTH 2 TIMES DAILY. 60 tablet 0   enzalutamide (XTANDI) 40 MG tablet Take 4 tablets (160 mg total) by mouth daily. 120 tablet 1   famotidine (PEPCID) 20 MG tablet Take 1 tablet (20 mg total) by mouth daily. 30 tablet 0   FLUoxetine (PROZAC) 20 MG capsule Take 1 capsule (20 mg total) by mouth at bedtime. 90 capsule 3   fluticasone (FLONASE) 50 MCG/ACT nasal spray Place 1 spray into both nostrils daily. 16 g 0   levothyroxine (SYNTHROID) 88 MCG tablet Take 1 tablet (88 mcg total) by mouth daily. 30 tablet 0   Multiple Vitamin (MULTIVITAMIN) tablet Take 1 tablet by mouth daily.     potassium chloride (KLOR-CON M) 10 MEQ tablet Take 1 tablet (10 mEq total) by mouth at bedtime. 30 tablet 0   sennosides-docusate sodium (SENOKOT-S) 8.6-50 MG tablet Take 2 tablets by mouth daily. 60 tablet 11    tamsulosin (FLOMAX) 0.4 MG CAPS capsule Take 1 capsule (0.4 mg total) by mouth daily after supper. 30 capsule 0   No current facility-administered medications for this visit.    Review of Systems  Constitutional:  Positive for fatigue. Negative for appetite change and chills.  HENT:   Negative for hearing loss.   Eyes:  Negative for icterus.  Respiratory:  Negative for cough and shortness of breath.   Cardiovascular:  Negative for chest pain.  Gastrointestinal:  Negative for abdominal pain and blood in stool.  Genitourinary:         +  Foley catheter  Musculoskeletal:  Negative for back pain.  Skin:  Negative for rash.  Neurological:  Positive for extremity weakness.     PHYSICAL EXAMINATION: ECOG PERFORMANCE STATUS: 2 - Symptomatic, <50% confined to bed  Vitals:   02/07/22 1014  BP: (!) 148/92  Pulse: 97  Resp: 18   Filed Weights   02/07/22 1014  Weight: 179 lb (81.2 kg)     Physical Exam Constitutional:      General: He is not in acute distress.    Appearance: He is not diaphoretic.     Comments: Patient sits in wheelchair.  HENT:     Head: Normocephalic and atraumatic.     Nose: Nose normal.     Mouth/Throat:     Pharynx: No oropharyngeal exudate.  Eyes:     General: No scleral icterus. Cardiovascular:     Rate and Rhythm: Normal rate.  Pulmonary:     Effort: Pulmonary effort is normal. No respiratory distress.  Abdominal:     General: There is no distension.  Musculoskeletal:        General: Normal range of motion.     Cervical back: Normal range of motion.  Skin:    General: Skin is warm and dry.     Findings: No erythema.  Neurological:     Mental Status: He is alert and oriented to person, place, and time.     Cranial Nerves: No cranial nerve deficit.     Motor: No abnormal muscle tone.     Comments: Decreased lower extremity strength.  Psychiatric:        Mood and Affect: Mood and affect normal.      LABORATORY DATA:  I have reviewed the  data as listed    Latest Ref Rng & Units 02/07/2022    9:23 AM 01/22/2022    4:45 AM 01/20/2022    3:33 AM  CBC  WBC 4.0 - 10.5 K/uL 6.0  5.5  6.8   Hemoglobin 13.0 - 17.0 g/dL 14.1  11.1  11.1   Hematocrit 39.0 - 52.0 % 42.6  34.4  34.0   Platelets 150 - 400 K/uL 268  179  174       Latest Ref Rng & Units 02/07/2022    9:23 AM 01/22/2022    4:45 AM 01/20/2022    3:33 AM  CMP  Glucose 70 - 99 mg/dL 109  98  103   BUN 8 - 23 mg/dL '10  16  14   '$ Creatinine 0.61 - 1.24 mg/dL 0.46  0.39  0.43   Sodium 135 - 145 mmol/L 139  135  139   Potassium 3.5 - 5.1 mmol/L 3.6  3.7  3.2   Chloride 98 - 111 mmol/L 100  109  107   CO2 22 - 32 mmol/L '26  20  24   '$ Calcium 8.9 - 10.3 mg/dL 9.1  8.6  8.7   Total Protein 6.5 - 8.1 g/dL 7.2   5.7   Total Bilirubin 0.3 - 1.2 mg/dL 1.0   1.0   Alkaline Phos 38 - 126 U/L 142   135   AST 15 - 41 U/L 36   21   ALT 0 - 44 U/L 27   18      RADIOGRAPHIC STUDIES: I have personally reviewed the radiological images as listed and agreed with the findings in the report. MR CERVICAL SPINE W WO CONTRAST  Result Date: 01/30/2022 CLINICAL DATA:  66 year old male with a history  of metastatic prostate cancer, extraosseous tumor with malignant spinal stenosis cervical in August at the C6 level. Status post decompression at that time. Restaging. Study performed under anesthesia. EXAM: MRI CERVICAL SPINE WITHOUT AND WITH CONTRAST TECHNIQUE: Multiplanar and multiecho pulse sequences of the cervical spine, to include the craniocervical junction and cervicothoracic junction, were obtained without and with intravenous contrast. CONTRAST:  23m GADAVIST GADOBUTROL 1 MMOL/ML IV SOLN COMPARISON:  Postoperative MRI 10/07/2021.  PET-CT 12/12/2021. FINDINGS: Alignment: Similar straightening of cervical lordosis since August. No significant spondylolisthesis. Vertebrae: Widespread osseous metastatic disease. Increased intrinsic T1 signal from the skull base through the C4 body which  might indicate previous radiation therapy. Enlarging oval T1 hypointense metastasis of the posterior C2 and odontoid on series 6, image 9, now 11 mm long axis versus subcentimeter in August. Confluent C4 posterior element tumor eccentric on the left. Confluent tumor infiltration of C5 through the upper thoracic vertebrae with chronic extraosseous tumor extension C5 and C6. But since 10/07/2021 considerably improved lower cervical spinal canal patency. See additional details of those levels below. Cord: Upper cervical spinal cord through the C4 level signal and morphology remains within normal limits. There is subtle patchy T2 spinal cord heterogeneity at both C5 and C6 (series 5 image 10 and series 8 image 22) without associated cord vasogenic edema or enhancement. There is mild residual circumferential epidural space or dural thickening or enhancement in the cervical spine from C3-C4 to C5-C6 (series 12 image 17 at the C6 level), but considerably improved spinal canal patency compared to August. See further details below. Negative visible upper thoracic canal and cord. Posterior Fossa, vertebral arteries, paraspinal tissues: Cervicomedullary junction is within normal limits. Grossly negative visible brain parenchyma. Postoperative changes to the posterior paraspinal soft tissues with small volume postoperative seroma tracking to the C6 posterior epidural space (series 12 images 22 through 25). Some rim enhancement of the fluid but no mass effect on the posterior thecal sac or regional inflammation. Indistinct extraosseous tumor at C6 and C7 but with maintained patency of both cervical vertebral arteries, no definite arterial in case mint. No prevertebral soft tissue thickening. Other visible neck soft tissues appear negative; anesthesia airway in place. Negative lung apices. Disc levels: C2-C3:  Mild facet hypertrophy on the left, otherwise negative. C3-C4: Chronic disc and endplate degeneration with disc bulging  and endplate spurring. Mild to moderate facet and ligament flavum hypertrophy. Borderline to mild spinal stenosis here is stable. Mild to moderate degenerative left C4 foraminal stenosis. C4-C5: Disc and posterior element degeneration with circumferential disc osteophyte complex and mild to moderate facet and ligament flavum hypertrophy. Trace degenerative facet joint fluid on the right. Some superimposed abnormal epidural thickening and enhancement here (series 12, image 15). But regressed and virtually resolved spinal stenosis here since August. Mild to moderate degenerative appearing foraminal stenosis. C5-C6: Disc and endplate degeneration. Posterior decompression with resolved spinal stenosis. Mild mostly degenerative appearing foraminal stenosis. C6-C7: Disc and endplate degeneration. The posterior decompression stops at the C7 spinous process level. Resolved spinal stenosis here. Mostly degenerative appearing mild to moderate left and moderate to severe right C7 foraminal stenosis. C7-T1:  Negative disc.  No spinal stenosis.  Mild facet hypertrophy. IMPRESSION: 1. Substantially improved appearance of the lower cervical spinal canal since August post treatment, C4-C5 through C6-C7. Posterior decompression with essentially resolved spinal stenosis at those levels, although underlying vertebral and epidural tumor persists. Small posteriorly situated postoperative seroma appears inconsequential. 2. Mild nonenhancing spinal cord signal abnormality at C6-C7, but no  cord or leptomeningeal metastasis, no vasogenic cord edema. 3. Progressed C2/odontoid bone metastasis since August, now 11 mm. 4. Underlying cervical spine degeneration. Mild residual degenerative spinal stenosis at C4-C5. Electronically Signed   By: Genevie Ann M.D.   On: 01/30/2022 10:52   CT Angio Chest Pulmonary Embolism (PE) W or WO Contrast  Result Date: 01/20/2022 CLINICAL DATA:  History of prostate carcinoma with fevers, initial encounter EXAM:  CT ANGIOGRAPHY CHEST WITH CONTRAST TECHNIQUE: Multidetector CT imaging of the chest was performed using the standard protocol during bolus administration of intravenous contrast. Multiplanar CT image reconstructions and MIPs were obtained to evaluate the vascular anatomy. RADIATION DOSE REDUCTION: This exam was performed according to the departmental dose-optimization program which includes automated exposure control, adjustment of the mA and/or kV according to patient size and/or use of iterative reconstruction technique. CONTRAST: 70m OMNIPAQUE IOHEXOL 350 MG/ML SOLN COMPARISON:  Chest x-ray from the previous day. FINDINGS: Cardiovascular: Thoracic aorta and its branches demonstrate atherosclerotic calcification. No aneurysmal dilatation or dissection is seen. Coronary calcifications are noted. Pulmonary artery shows a normal branching pattern without filling defect to suggest pulmonary embolism. Mediastinum/Nodes: Thoracic inlet is within normal limits. No hilar or mediastinal adenopathy is noted. Few calcified hilar lymph nodes are noted consistent with prior granulomatous disease. The esophagus is within normal limits. Lungs/Pleura: Mild emphysematous changes are noted. The lungs are well aerated with scattered calcified granulomas consistent with prior granulomatous disease. Mild right basilar atelectasis is noted Upper Abdomen: Visualized upper abdomen is unremarkable. Few scattered splenic granulomas are noted. Musculoskeletal: Diffuse sclerotic metastatic foci are noted throughout the bony skeleton. No acute abnormality noted. Review of the MIP images confirms the above findings. IMPRESSION: Changes consistent with diffuse prostate bony metastatic disease. No evidence of pulmonary emboli. Changes of prior granulomatous disease. Aortic Atherosclerosis (ICD10-I70.0) and Emphysema (ICD10-J43.9). Electronically Signed   By: MInez CatalinaM.D.   On: 01/20/2022 02:27   DG Chest Port 1 View  Result Date:  01/19/2022 CLINICAL DATA:  [id number]: PORTABLE CHEST 1 VIEW COMPARISON:  Chest x-ray 10/17/2021, PET CT 12/12/2021 FINDINGS: The heart and mediastinal contours are unchanged. Low lung volumes. Similar-appearing calcification overlying right lung and better evaluated on PET-CT 12/12/2021. Interval development of a right apical airspace opacity. No pulmonary edema. No pleural effusion. No pneumothorax. Sclerotic appearance of the appendicular skeleton consistent with known metastases. Reverse total left shoulder arthroplasty. IMPRESSION: Interval development of a right apical airspace opacity. Finding may be due to positioning. Recommend repeat PA and lateral view of the chest. Electronically Signed   By: MIven FinnM.D.   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 Currently on chemoprevention therapy with Eligard.  Status post palliative radiation to spine. PMSA PET scan showed partial response. Patient is not a candidate for chemotherapy with docetaxel. Labs are reviewed and discussed with patient.  He tolerates Xtandi 160 mg daily, continue.   Fatigue could be due to CNS side effects.  BP is slightly elevated. Continue monitor.   History of deep vein thrombosis (DVT) of lower extremity Per Dr.Lovorn Megan's note, patient developed left gastrocnemius vein DVT 8/26.  Provoked by immobilization.Repeat ultrasound showed  no blood clots. Discussed with patient that he may develop recurrent DVT due to immobility secondary to paralysis.   off Eliquis due to interaction with Xtandi Continue Aspirin 162 mg daily for  prophylaxis.  Androgen deprivation therapy Proceed with Eligard '45mg'$  today  Neurogenic bladder Continue follow up with urology  Chronic incomplete quadriplegia [facility]) Established care with neurology Dr.Vaslow today Repeat MRI spine.  Continue physical therapy.   Orders Placed This Encounter  Procedures   CBC with Differential/Platelet    Standing Status:   Future    Standing Expiration Date:   02/08/2023   Comprehensive metabolic panel    Standing Status:   Future    Standing Expiration Date:   02/07/2023   PSA    Standing Status:   Future    Standing Expiration Date:   02/07/2023   Ferritin    Standing Status:   Future    Number of Occurrences:   1    Standing Expiration Date:   02/08/2023   Iron and TIBC    Standing Status:   Future    Number of Occurrences:   1    Standing Expiration Date:   02/08/2023   Retic Panel    Standing Status:   Future    Number of Occurrences:   1    Standing Expiration Date:   02/08/2023

## 2022-02-07 NOTE — Assessment & Plan Note (Signed)
Proceed with Eligard '45mg'$  today

## 2022-02-07 NOTE — Assessment & Plan Note (Signed)
Continue follow up with urology

## 2022-02-07 NOTE — Assessment & Plan Note (Addendum)
Metastatic castration sensitive prostate cancer, with CNS involvement. Currently on chemoprevention therapy with Eligard.  Status post palliative radiation to spine. PMSA PET scan showed partial response. Patient is not a candidate for chemotherapy with docetaxel. Labs are reviewed and discussed with patient.  He tolerates Xtandi 160 mg daily, continue.   Fatigue could be due to CNS side effects.  BP is slightly elevated. Continue monitor.

## 2022-02-07 NOTE — Assessment & Plan Note (Addendum)
Per Jason [name] Jason [name]'s note, patient developed left gastrocnemius vein DVT 8/26.  Provoked by immobilization.Repeat ultrasound showed no blood clots. Discussed with patient that he may develop recurrent DVT due to immobility secondary to paralysis.   off Eliquis due to interaction with Xtandi Continue Aspirin 162 mg daily for prophylaxis.

## 2022-02-07 NOTE — Progress Notes (Signed)
Patient here for follow up he has recently been hospitalized with a UTI.

## 2022-02-07 NOTE — Progress Notes (Signed)
[facility] [address], [place] [address] [phone]   Interval Evaluation  Date of Service: 02/07/22 Patient Name: Jason [name] Patient MRN: [id number] Patient DOB: 11/02/1955 Provider: Ventura Sellers, MD  Identifying Statement:  Jason [name] is a 66 y.o. 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  Associated epidural disease greater at the C6 where there is marked canal stenosis without cord compression.  Left greater than right foraminal effacement due to the extraosseous disease.   10/03/2021 Imaging   MRI left shoulder showed High-riding humeral head with massive full-thickness tear of the entire supraspinatus tendon and the anterior 50% of the infraspinatus tendon. Additional partial-thickness articular sided tearing of the posterior infraspinatus. Moderate to high-grade supraspinatus and infraspinatus muscle atrophy suggests these tears  are chronic. Partial-thickness tearing of the superior greater than inferior  aspects of the subscapularis tendon footprint. Mild subscapularis muscle atrophy.  Moderate degenerative changes of the acromioclavicular joint. Moderate glenohumeral cartilage degenerative changes.   10/05/2021 Imaging   CT chest abdomen pelvis with contrast showed  Moderate severity retroperitoneal and pelvic lymphadenopathy,consistent with metastatic disease. Small lytic areas at the levels of L4 and S1, with diffusely sclerotic changes involving the second and fourth right ribs, sacrum and left iliac bone. 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  Sigmoid diverticulosis. Aortic atherosclerosis   10/05/2021 Imaging   CT cervical spine without contrast Extensive heterogeneous sclerotic lesions throughout the cervical spine compatible with metastatic disease to the bone.  Mixed sclerotic and lytic changes in the posterior elements at C5 and C6.Extraosseous tumor at C5 and C6 is better appreciated on the MRI scan.   10/05/2021 Tumor Marker   PSA >1500   10/06/2021 Cancer Staging   Staging form: Prostate, AJCC 8th Edition - Clinical stage from 10/06/2021: Stage IVB (cN1, pM1c, PSA: 1500) - Signed by Earlie Server, MD on 12/04/2021 Stage prefix: Initial diagnosis Prostate specific antigen (PSA) range: 20 or greater   10/08/2021 Imaging   MRI thoracic spine and the lumbar spine with and without contrast 1. 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 2. 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 3. Additional epidural tumor along the posterior endplates at O6,V67, and T12 without high-grade spinal canal stenosis or cord compression. 4. Extensive epidural tumor in the lumbar spine is greatest in thickness at L1 without high-grade spinal canal stenosis at this level; however, superimposed on pre-existing degenerative changes lower in the lumbar spine results in severe spinal canal stenosis with cauda equina nerve root compression at L3-L4 and L4-L5. Multilevel neural foraminal stenosis is detailed above. 5. Postsurgical changes reflecting C6 laminectomy unchanged spinal canal stenosis compared to the postoperative study from 1 day prior.No definite cord signal abnormality. 6. Extramedullary fluid collection along the dorsal  aspect of the cord beginning at T9-T10 extending into the lumbar spine is likely subdural in location but is of uncertain etiology; evolving  blood products not excluded. The collection anteriorly displaces the cord and cauda equina nerve roots without frank cord compression or signal abnormality. There is no peripheral enhancement to suggest abscess. 7. Prevertebral edema in the cervical spine which may be postsurgical in nature. 8. New bilateral lower lobe consolidations may reflect atelectasis or developing pneumonia/aspiration.   10/08/2021 Imaging   MRI brain with and without contrast showed 1. 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 There is intracranial extension of tumor along the right frontal convexity measuring up to 5 mm in thickness without mass effect on the underlying brain parenchyma or midline shift. Extracranial extension of tumor in this location measures up to approximately 3 mm. 2. Additional extracranial extension of tumor into the scalp along the vertex measuring up to 7 mm in thickness. 3. Small focus of diffusion restriction in the right parietal cortex most in keeping with a small acute infarct. 4. No evidence of parenchymal metastatic disease.   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 10/06/2021, patient underwent C6 cervical laminectomy and resection of tumor.  Pathology came back positive for metastatic prostate cancer. 8/14 developed significant bilateral upper and lower extremity weakness with hypotension requiring transfer to the ICU, pressors, steroid, respiratory failure on mechanical ventilation. Later patient was stabilized and extubated.  8/16 Firmagon loading dose '240mg'$   9/15 Eligard 22.'5mg'$  given inpatient during his rehab admission.     10/2021 -  Radiation Therapy   Palliative radiation to spine.     12/04/2021 Tumor Marker   PSA 67.09    CNS Oncologic History 10/06/21: Laminectomy, resection of epidural tumor ~C6 with Dr. Cari Caraway.   11/13/21: Post-op radiation, 30/10 from C4 to T2 with Dr. Tammi Klippel  Interval History: Jason [name] presents today for follow up after recent cervical spine MRI study.  He reports improved strength in both legs, left arm.  He has regained some bladder sensation as well, although indwelling foley is still present.  He remains in the motorized wheelchair, is working aggressively with physical and occupational therapy.  Continues on prostate cancer therapy with Dr. Tasia Catchings.  No new or progressive symptoms otherwise.  H+P (12/20/21) Patient presented to neurologic attention in August with new onset numbness in left arm and hand.  This had been proceeded by several months of pain in back of his neck.  Spine imaging demonstrated diffuse metastatic burden and tumor infiltrating spinal column in cervical region.  He underwent laminectomy and debulking of epidural tumor with Dr. Cari Caraway on 10/06/21 at [place].  This was then followed by post-operative radiation therapy with Dr. Tammi Klippel in [place], also to cervical and upper thoracic region, complete 9/20 after 10 sessions.  No surgery or radiation has been focused on lower spine to this point.  At present, he is wheelchair bound due to bilateral leg weakness, which is improving gradually through aggressive PT.  His arms and hands are functional aside from more chronic weakness in his left shoulder.  He is incontinent of urine and has indwelling foley, but he does have bladder and saddle sensation intact.     Medications: Current Outpatient Medications on File Prior to Visit  Medication Sig Dispense Refill   acetaminophen (TYLENOL) 325 MG tablet Take 1-2 tablets (325-650 mg total) by mouth every 4 (four) hours as needed for mild pain.     ascorbic acid (VITAMIN C) 500 MG tablet Take 1  tablet (500 mg total) by mouth  daily. 30 tablet 0   aspirin EC 81 MG tablet Take 1 tablet (81 mg total) by mouth daily. Swallow whole. (Patient taking differently: Take 162 mg by mouth daily. Swallow whole.) 30 tablet 12   atorvastatin (LIPITOR) 40 MG tablet Take 1 tablet (40 mg total) by mouth daily. 90 tablet 3   bisacodyl (DULCOLAX) 10 MG suppository Place 1 suppository (10 mg total) rectally daily as needed for moderate constipation. (Patient taking differently: Place 10 mg rectally daily.) 12 suppository 0   cetirizine (ZYRTEC) 10 MG tablet Take 10 mg by mouth at bedtime.     clonazePAM (KLONOPIN) 0.5 MG tablet TAKE 1 TABLET BY MOUTH 2 TIMES DAILY. 60 tablet 0   enzalutamide (XTANDI) 40 MG tablet Take 4 tablets (160 mg total) by mouth daily. 120 tablet 1   famotidine (PEPCID) 20 MG tablet Take 1 tablet (20 mg total) by mouth daily. 30 tablet 0   FLUoxetine (PROZAC) 20 MG capsule Take 1 capsule (20 mg total) by mouth at bedtime. 90 capsule 3   fluticasone (FLONASE) 50 MCG/ACT nasal spray Place 1 spray into both nostrils daily. 16 g 0   levothyroxine (SYNTHROID) 88 MCG tablet Take 1 tablet (88 mcg total) by mouth daily. 30 tablet 0   Multiple Vitamin (MULTIVITAMIN) tablet Take 1 tablet by mouth daily.     potassium chloride (KLOR-CON M) 10 MEQ tablet Take 1 tablet (10 mEq total) by mouth at bedtime. 30 tablet 0   sennosides-docusate sodium (SENOKOT-S) 8.6-50 MG tablet Take 2 tablets by mouth daily. 60 tablet 11   tamsulosin (FLOMAX) 0.4 MG CAPS capsule Take 1 capsule (0.4 mg total) by mouth daily after supper. 30 capsule 0   No current facility-administered medications on file prior to visit.    Allergies: No Known Allergies Past Medical History:  Past Medical History:  Diagnosis Date   Allergy    Arthritis    osteoarthritis   GERD (gastroesophageal reflux disease)    History of kidney stones    Hypertension    Hypothyroidism    Thyroid disease    Hyperthyroidism s/p radioactive iodine ablation   Past Surgical  History:  Past Surgical History:  Procedure Laterality Date   CARDIAC CATHETERIZATION  03/2000   HERNIA REPAIR  [id number]   umbilical   LEFT HEART CATH AND CORONARY ANGIOGRAPHY Left 03/20/2020   Procedure: LEFT HEART CATH AND CORONARY ANGIOGRAPHY;  Surgeon: Nelva Bush, MD;  Location: [facility] [place] [facility];  Service: Cardiovascular;  Laterality: Left;   POSTERIOR CERVICAL FUSION/FORAMINOTOMY N/A 10/06/2021   Procedure: POSTERIOR CERVICAL FUSION/ FORAMINOTOMY LEVEL 3;  Surgeon: Meade Maw, MD;  Location: [facility];  Service: Neurosurgery;  Laterality: N/A;  will need monitoring   RADIOLOGY WITH ANESTHESIA Left 04/18/2021   Procedure: MRI SHOULDER WITHOUT CONTRAST WITH ANESTHESIA;  Surgeon: Radiologist, Medication, MD;  Location: [place];  Service: Radiology;  Laterality: Left;   RADIOLOGY WITH ANESTHESIA N/A 10/03/2021   Procedure: MRI CERVICAL SPINE WITH ANESTHESIA;  Surgeon: Radiologist, Medication, MD;  Location: [place];  Service: Radiology;  Laterality: N/A;   RADIOLOGY WITH ANESTHESIA N/A 01/30/2022   Procedure: MRI CERVICAL SPINE WITH AND WITOHOUT CONTRAST WITH ANESTHESIA;  Surgeon: Radiologist, Medication, MD;  Location: [place];  Service: Radiology;  Laterality: N/A;   REVERSE SHOULDER ARTHROPLASTY Left    ROTATOR CUFF REPAIR  11/2010   Dr [name] Heck   TONSILLECTOMY     TOTAL HIP ARTHROPLASTY Right 02/24/2009   TOTAL HIP ARTHROPLASTY  Left 06/2016   Dr Harlow Mares   Social History:  Social History   Socioeconomic History   Marital status: Married    Spouse name: [name]    Number of children: 0   Years of education: Not on file   Highest education level: Not on file  Occupational History   Occupation: Therapist, occupational: [employer]  Tobacco Use   Smoking status: Never    Passive exposure: Never   Smokeless tobacco: Never  Vaping Use   Vaping Use: Never used  Substance and Sexual Activity   Alcohol use: Not Currently    Comment: Alcohol once every few weeks/months    Drug use: No   Sexual activity: Yes  Other Topics Concern   Not on file  Social History Narrative   Lives at home with spouse.    Social Determinants of Health   Financial Resource Strain: Not on file  Food Insecurity: Not on file  Transportation Needs: Not on file  Physical Activity: Not on file  Stress: Not on file  Social Connections: Not on file  Intimate Partner Violence: Not on file   Family History:  Family History  Problem Relation Age of Onset   Hypertension Mother    Stroke Mother    Gout Father    Heart attack Father 60   Heart disease Sister    Lung cancer Sister    Drug abuse Sister    Breast cancer Paternal Aunt    Lung cancer Maternal Grandmother    Colon cancer Neg Hx    Esophageal cancer Neg Hx    Rectal cancer Neg Hx    Stomach cancer Neg Hx     Review of Systems: Constitutional: Doesn't report fevers, chills or abnormal weight loss Eyes: Doesn't report blurriness of vision Ears, nose, mouth, throat, and face: Doesn't report sore throat Respiratory: Doesn't report cough, dyspnea or wheezes Cardiovascular: Doesn't report palpitation, chest discomfort  Gastrointestinal:  Doesn't report nausea, constipation, diarrhea GU: Doesn't report incontinence Skin: Doesn't report skin rashes Neurological: Per HPI Musculoskeletal: Doesn't report joint pain Behavioral/Psych: Doesn't report anxiety  Physical Exam: Vitals:   02/07/22 0946 02/07/22 1007  BP: (!) 148/92   Pulse: 97   Resp: 20   Temp:  98.6 F (37 C)  SpO2: 96%    KPS: 50. General: Alert, cooperative, pleasant, in no acute distress Head: Normal EENT: No conjunctival injection or scleral icterus.  Lungs: Resp effort normal Cardiac: Regular rate Abdomen: indwelling foley Skin: No rashes cyanosis or petechiae. Extremities: No clubbing or edema  Neurologic Exam: Mental Status: Awake, alert, attentive to examiner. Oriented to self and environment. Language is fluent with intact  comprehension.  Cranial Nerves: Visual acuity is grossly normal. Visual fields are full. Extra-ocular movements intact. No ptosis. Face is symmetric Motor: Spastic tone.  Left arm 3/5 at deltoid, 5/5 elsewhere.  Some impairment in fine motor function.  Legs 3/5 bilaterally, spastic and hyperreflexic. Sensory: Intact to light touch Gait: Non ambulatory   Labs: I have reviewed the data as listed    Component Value Date/Time   NA 135 01/22/2022 0445   NA 140 01/29/2021 0822   K 3.7 01/22/2022 0445   CL 109 01/22/2022 0445   CO2 20 (L) 01/22/2022 0445   GLUCOSE 98 01/22/2022 0445   BUN 16 01/22/2022 0445   BUN 16 01/29/2021 0822   CREATININE 0.39 (L) 01/22/2022 0445   CALCIUM 8.6 (L) 01/22/2022 0445   PROT 5.7 (L) 01/20/2022 1610  PROT 7.2 01/29/2021 0822   ALBUMIN 3.0 (L) 01/20/2022 0333   ALBUMIN 4.4 01/29/2021 0822   AST 21 01/20/2022 0333   ALT 18 01/20/2022 0333   ALKPHOS 135 (H) 01/20/2022 0333   BILITOT 1.0 01/20/2022 0333   BILITOT 1.1 01/29/2021 0822   GFRNONAA >60 01/22/2022 0445   GFRAA 106 03/14/2020 1254   Lab Results  Component Value Date   WBC 5.5 01/22/2022   NEUTROABS 7.9 (H) 01/19/2022   HGB 11.1 (L) 01/22/2022   HCT 34.4 (L) 01/22/2022   MCV 91.5 01/22/2022   PLT 179 01/22/2022    Imaging:  MR CERVICAL SPINE W WO CONTRAST  Result Date: 01/30/2022 CLINICAL DATA:  66 year old male with a history of metastatic prostate cancer, extraosseous tumor with malignant spinal stenosis cervical in August at the C6 level. Status post decompression at that time. Restaging. Study performed under anesthesia. EXAM: MRI CERVICAL SPINE WITHOUT AND WITH CONTRAST TECHNIQUE: Multiplanar and multiecho pulse sequences of the cervical spine, to include the craniocervical junction and cervicothoracic junction, were obtained without and with intravenous contrast. CONTRAST:  67m GADAVIST GADOBUTROL 1 MMOL/ML IV SOLN COMPARISON:  Postoperative MRI 10/07/2021.  PET-CT 12/12/2021.  FINDINGS: Alignment: Similar straightening of cervical lordosis since August. No significant spondylolisthesis. Vertebrae: Widespread osseous metastatic disease. Increased intrinsic T1 signal from the skull base through the C4 body which might indicate previous radiation therapy. Enlarging oval T1 hypointense metastasis of the posterior C2 and odontoid on series 6, image 9, now 11 mm long axis versus subcentimeter in August. Confluent C4 posterior element tumor eccentric on the left. Confluent tumor infiltration of C5 through the upper thoracic vertebrae with chronic extraosseous tumor extension C5 and C6. But since 10/07/2021 considerably improved lower cervical spinal canal patency. See additional details of those levels below. Cord: Upper cervical spinal cord through the C4 level signal and morphology remains within normal limits. There is subtle patchy T2 spinal cord heterogeneity at both C5 and C6 (series 5 image 10 and series 8 image 22) without associated cord vasogenic edema or enhancement. There is mild residual circumferential epidural space or dural thickening or enhancement in the cervical spine from C3-C4 to C5-C6 (series 12 image 17 at the C6 level), but considerably improved spinal canal patency compared to August. See further details below. Negative visible upper thoracic canal and cord. Posterior Fossa, vertebral arteries, paraspinal tissues: Cervicomedullary junction is within normal limits. Grossly negative visible brain parenchyma. Postoperative changes to the posterior paraspinal soft tissues with small volume postoperative seroma tracking to the C6 posterior epidural space (series 12 images 22 through 25). Some rim enhancement of the fluid but no mass effect on the posterior thecal sac or regional inflammation. Indistinct extraosseous tumor at C6 and C7 but with maintained patency of both cervical vertebral arteries, no definite arterial in case mint. No prevertebral soft tissue thickening.  Other visible neck soft tissues appear negative; anesthesia airway in place. Negative lung apices. Disc levels: C2-C3:  Mild facet hypertrophy on the left, otherwise negative. C3-C4: Chronic disc and endplate degeneration with disc bulging and endplate spurring. Mild to moderate facet and ligament flavum hypertrophy. Borderline to mild spinal stenosis here is stable. Mild to moderate degenerative left C4 foraminal stenosis. C4-C5: Disc and posterior element degeneration with circumferential disc osteophyte complex and mild to moderate facet and ligament flavum hypertrophy. Trace degenerative facet joint fluid on the right. Some superimposed abnormal epidural thickening and enhancement here (series 12, image 15). But regressed and virtually resolved spinal stenosis here since  August. Mild to moderate degenerative appearing foraminal stenosis. C5-C6: Disc and endplate degeneration. Posterior decompression with resolved spinal stenosis. Mild mostly degenerative appearing foraminal stenosis. C6-C7: Disc and endplate degeneration. The posterior decompression stops at the C7 spinous process level. Resolved spinal stenosis here. Mostly degenerative appearing mild to moderate left and moderate to severe right C7 foraminal stenosis. C7-T1:  Negative disc.  No spinal stenosis.  Mild facet hypertrophy. IMPRESSION: 1. Substantially improved appearance of the lower cervical spinal canal since August post treatment, C4-C5 through C6-C7. Posterior decompression with essentially resolved spinal stenosis at those levels, although underlying vertebral and epidural tumor persists. Small posteriorly situated postoperative seroma appears inconsequential. 2. Mild nonenhancing spinal cord signal abnormality at C6-C7, but no cord or leptomeningeal metastasis, no vasogenic cord edema. 3. Progressed C2/odontoid bone metastasis since August, now 11 mm. 4. Underlying cervical spine degeneration. Mild residual degenerative spinal stenosis at  C4-C5. Electronically Signed   By: Genevie Ann M.D.   On: 01/30/2022 10:52   CT Angio Chest Pulmonary Embolism (PE) W or WO Contrast  Result Date: 01/20/2022 CLINICAL DATA:  History of prostate carcinoma with fevers, initial encounter EXAM: CT ANGIOGRAPHY CHEST WITH CONTRAST TECHNIQUE: Multidetector CT imaging of the chest was performed using the standard protocol during bolus administration of intravenous contrast. Multiplanar CT image reconstructions and MIPs were obtained to evaluate the vascular anatomy. RADIATION DOSE REDUCTION: This exam was performed according to the departmental dose-optimization program which includes automated exposure control, adjustment of the mA and/or kV according to patient size and/or use of iterative reconstruction technique. CONTRAST: 53m OMNIPAQUE IOHEXOL 350 MG/ML SOLN COMPARISON:  Chest x-ray from the previous day. FINDINGS: Cardiovascular: Thoracic aorta and its branches demonstrate atherosclerotic calcification. No aneurysmal dilatation or dissection is seen. Coronary calcifications are noted. Pulmonary artery shows a normal branching pattern without filling defect to suggest pulmonary embolism. Mediastinum/Nodes: Thoracic inlet is within normal limits. No hilar or mediastinal adenopathy is noted. Few calcified hilar lymph nodes are noted consistent with prior granulomatous disease. The esophagus is within normal limits. Lungs/Pleura: Mild emphysematous changes are noted. The lungs are well aerated with scattered calcified granulomas consistent with prior granulomatous disease. Mild right basilar atelectasis is noted Upper Abdomen: Visualized upper abdomen is unremarkable. Few scattered splenic granulomas are noted. Musculoskeletal: Diffuse sclerotic metastatic foci are noted throughout the bony skeleton. No acute abnormality noted. Review of the MIP images confirms the above findings. IMPRESSION: Changes consistent with diffuse prostate bony metastatic disease. No evidence  of pulmonary emboli. Changes of prior granulomatous disease. Aortic Atherosclerosis (ICD10-I70.0) and Emphysema (ICD10-J43.9). Electronically Signed   By: MInez CatalinaM.D.   On: 01/20/2022 02:27   DG Chest Port 1 View  Result Date: 01/19/2022 CLINICAL DATA:  [id number]: PORTABLE CHEST 1 VIEW COMPARISON:  Chest x-ray 10/17/2021, PET CT 12/12/2021 FINDINGS: The heart and mediastinal contours are unchanged. Low lung volumes. Similar-appearing calcification overlying right lung and better evaluated on PET-CT 12/12/2021. Interval development of a right apical airspace opacity. No pulmonary edema. No pleural effusion. No pneumothorax. Sclerotic appearance of the appendicular skeleton consistent with known metastases. Reverse total left shoulder arthroplasty. IMPRESSION: Interval development of a right apical airspace opacity. Finding may be due to positioning. Recommend repeat PA and lateral view of the chest. Electronically Signed   By: MIven FinnM.D.   On: 01/19/2022 22:04   UKoreaVenous Img Lower Unilateral Left  Result Date: 01/08/2022 CLINICAL DATA:  History left gastrocnemius vein DVT in August. EXAM: LEFT LOWER EXTREMITY  VENOUS DOPPLER ULTRASOUND TECHNIQUE: Gray-scale sonography with graded compression, as well as color Doppler and duplex ultrasound were performed to evaluate the lower extremity deep venous systems from the level of the common femoral vein and including the common femoral, femoral, profunda femoral, popliteal and calf veins including the posterior tibial, peroneal and gastrocnemius veins when visible. The superficial great saphenous vein was also interrogated. Spectral Doppler was utilized to evaluate flow at rest and with distal augmentation maneuvers in the common femoral, femoral and popliteal veins. COMPARISON:  10/11/2021 and prior study at [facility] on 10/18/2021. FINDINGS: Contralateral Common Femoral Vein: Respiratory phasicity is normal and symmetric with the  symptomatic side. No evidence of thrombus. Normal compressibility. Common Femoral Vein: No evidence of thrombus. Normal compressibility, respiratory phasicity and response to augmentation. Saphenofemoral Junction: No evidence of thrombus. Normal compressibility and flow on color Doppler imaging. Profunda Femoral Vein: No evidence of thrombus. Normal compressibility and flow on color Doppler imaging. Femoral Vein: No evidence of thrombus. Normal compressibility, respiratory phasicity and response to augmentation. Popliteal Vein: No evidence of thrombus. Normal compressibility, respiratory phasicity and response to augmentation. Calf Veins: No evidence of thrombus. No evidence of visualized gastrocnemius vein thrombus. Normal compressibility and flow on color Doppler imaging. Superficial Great Saphenous Vein: No evidence of thrombus. Normal compressibility. Venous Reflux:  None. Other Findings: No evidence of superficial thrombophlebitis or abnormal fluid collection. IMPRESSION: No evidence of left lower extremity deep venous thrombosis. No evidence of visualized gastrocnemius vein thrombus. Electronically Signed   By: Aletta Edouard M.D.   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  MRI cervical spine demonstrates mostly stable findings, post-laminectomy and RT.  There is a small lesion at C2 that we will keep an eye on.    We recommended continued imaging surveillance of the cervical spine.  He is due for lumbar spine study as well given high burden of disease there seen in August.  None of this was treated with localized therapy.    Will con't to follow with Dr. Cari Caraway, Dr. Tasia Catchings, urology team.  We appreciate the opportunity to participate in the care of Jason [name].   We ask that Jason [name] return to clinic in 4 months following next brain MRI, or sooner as needed.  All questions  were answered. The patient knows to call the clinic with any problems, questions or concerns. No barriers to learning were detected.  The total time spent in the encounter was 30 minutes and more than 50% was on counseling and review of test results   Ventura Sellers, MD Medical Director of Neuro-Oncology [facility] at [place] 02/07/22 9:26 AM

## 2022-02-10 DIAGNOSIS — E039 Hypothyroidism, unspecified: Secondary | ICD-10-CM | POA: Diagnosis not present

## 2022-02-10 DIAGNOSIS — C7951 Secondary malignant neoplasm of bone: Secondary | ICD-10-CM | POA: Diagnosis not present

## 2022-02-10 DIAGNOSIS — M4802 Spinal stenosis, cervical region: Secondary | ICD-10-CM | POA: Diagnosis not present

## 2022-02-10 DIAGNOSIS — I959 Hypotension, unspecified: Secondary | ICD-10-CM | POA: Diagnosis not present

## 2022-02-10 DIAGNOSIS — D630 Anemia in neoplastic disease: Secondary | ICD-10-CM | POA: Diagnosis not present

## 2022-02-10 DIAGNOSIS — G8254 Quadriplegia, C5-C7 incomplete: Secondary | ICD-10-CM | POA: Diagnosis not present

## 2022-02-10 DIAGNOSIS — C7940 Secondary malignant neoplasm of unspecified part of nervous system: Secondary | ICD-10-CM | POA: Diagnosis not present

## 2022-02-10 DIAGNOSIS — F419 Anxiety disorder, unspecified: Secondary | ICD-10-CM | POA: Diagnosis not present

## 2022-02-10 DIAGNOSIS — K219 Gastro-esophageal reflux disease without esophagitis: Secondary | ICD-10-CM | POA: Diagnosis not present

## 2022-02-10 DIAGNOSIS — N400 Enlarged prostate without lower urinary tract symptoms: Secondary | ICD-10-CM | POA: Diagnosis not present

## 2022-02-10 DIAGNOSIS — C61 Malignant neoplasm of prostate: Secondary | ICD-10-CM | POA: Diagnosis not present

## 2022-02-10 DIAGNOSIS — I6611 Occlusion and stenosis of right anterior cerebral artery: Secondary | ICD-10-CM | POA: Diagnosis not present

## 2022-02-10 DIAGNOSIS — D509 Iron deficiency anemia, unspecified: Secondary | ICD-10-CM | POA: Diagnosis not present

## 2022-02-10 DIAGNOSIS — M1990 Unspecified osteoarthritis, unspecified site: Secondary | ICD-10-CM | POA: Diagnosis not present

## 2022-02-10 DIAGNOSIS — I10 Essential (primary) hypertension: Secondary | ICD-10-CM | POA: Diagnosis not present

## 2022-02-10 DIAGNOSIS — N319 Neuromuscular dysfunction of bladder, unspecified: Secondary | ICD-10-CM | POA: Diagnosis not present

## 2022-02-12 ENCOUNTER — Encounter (HOSPITAL_COMMUNITY): Payer: Self-pay

## 2022-02-12 ENCOUNTER — Ambulatory Visit: Payer: BC Managed Care – PPO | Admitting: Internal Medicine

## 2022-02-12 ENCOUNTER — Other Ambulatory Visit (HOSPITAL_COMMUNITY): Payer: BC Managed Care – PPO

## 2022-02-13 ENCOUNTER — Encounter: Payer: Self-pay | Admitting: Oncology

## 2022-02-14 ENCOUNTER — Other Ambulatory Visit: Payer: Self-pay

## 2022-02-17 DIAGNOSIS — C61 Malignant neoplasm of prostate: Secondary | ICD-10-CM | POA: Diagnosis not present

## 2022-02-17 DIAGNOSIS — C7940 Secondary malignant neoplasm of unspecified part of nervous system: Secondary | ICD-10-CM | POA: Diagnosis not present

## 2022-02-17 DIAGNOSIS — G8250 Quadriplegia, unspecified: Secondary | ICD-10-CM | POA: Diagnosis not present

## 2022-02-21 ENCOUNTER — Other Ambulatory Visit: Payer: Self-pay

## 2022-02-21 DIAGNOSIS — C61 Malignant neoplasm of prostate: Secondary | ICD-10-CM

## 2022-02-21 MED ORDER — ENZALUTAMIDE 40 MG PO TABS: 160.0000 mg | ORAL_TABLET | Freq: Every day | ORAL | 1 refills | Status: DC

## 2022-02-22 ENCOUNTER — Other Ambulatory Visit: Payer: Self-pay | Admitting: Internal Medicine

## 2022-02-23 ENCOUNTER — Encounter: Payer: Self-pay | Admitting: Internal Medicine

## 2022-02-25 ENCOUNTER — Telehealth: Payer: Self-pay | Admitting: Internal Medicine

## 2022-02-25 DIAGNOSIS — N319 Neuromuscular dysfunction of bladder, unspecified: Secondary | ICD-10-CM | POA: Diagnosis not present

## 2022-02-25 DIAGNOSIS — D509 Iron deficiency anemia, unspecified: Secondary | ICD-10-CM | POA: Diagnosis not present

## 2022-02-25 DIAGNOSIS — M1990 Unspecified osteoarthritis, unspecified site: Secondary | ICD-10-CM | POA: Diagnosis not present

## 2022-02-25 DIAGNOSIS — K219 Gastro-esophageal reflux disease without esophagitis: Secondary | ICD-10-CM | POA: Diagnosis not present

## 2022-02-25 DIAGNOSIS — C61 Malignant neoplasm of prostate: Secondary | ICD-10-CM | POA: Diagnosis not present

## 2022-02-25 DIAGNOSIS — I10 Essential (primary) hypertension: Secondary | ICD-10-CM | POA: Diagnosis not present

## 2022-02-25 DIAGNOSIS — E039 Hypothyroidism, unspecified: Secondary | ICD-10-CM | POA: Diagnosis not present

## 2022-02-25 DIAGNOSIS — N400 Enlarged prostate without lower urinary tract symptoms: Secondary | ICD-10-CM | POA: Diagnosis not present

## 2022-02-25 DIAGNOSIS — I959 Hypotension, unspecified: Secondary | ICD-10-CM | POA: Diagnosis not present

## 2022-02-25 DIAGNOSIS — G8254 Quadriplegia, C5-C7 incomplete: Secondary | ICD-10-CM | POA: Diagnosis not present

## 2022-02-25 DIAGNOSIS — M4802 Spinal stenosis, cervical region: Secondary | ICD-10-CM | POA: Diagnosis not present

## 2022-02-25 DIAGNOSIS — C7951 Secondary malignant neoplasm of bone: Secondary | ICD-10-CM | POA: Diagnosis not present

## 2022-02-25 DIAGNOSIS — I6611 Occlusion and stenosis of right anterior cerebral artery: Secondary | ICD-10-CM | POA: Diagnosis not present

## 2022-02-25 DIAGNOSIS — D630 Anemia in neoplastic disease: Secondary | ICD-10-CM | POA: Diagnosis not present

## 2022-02-25 DIAGNOSIS — C7940 Secondary malignant neoplasm of unspecified part of nervous system: Secondary | ICD-10-CM | POA: Diagnosis not present

## 2022-02-25 DIAGNOSIS — F419 Anxiety disorder, unspecified: Secondary | ICD-10-CM | POA: Diagnosis not present

## 2022-02-25 MED ORDER — POTASSIUM CHLORIDE CRYS ER 10 MEQ PO TBCR: 10.0000 meq | EXTENDED_RELEASE_TABLET | Freq: Every day | ORAL | 3 refills | Status: DC

## 2022-02-25 NOTE — Telephone Encounter (Signed)
Anelisa from [facility] called stating she took the pt's BP twice & it running at 162/100 both times. Anelisa stated the pt isn't on any BP meds. Anelisa wanted advice on what to do. Call back # [phone]

## 2022-02-26 DIAGNOSIS — Z0271 Encounter for disability determination: Secondary | ICD-10-CM

## 2022-02-26 NOTE — Telephone Encounter (Signed)
Left message on verified VM for Anelisa with Dr Alla German note.

## 2022-02-27 ENCOUNTER — Encounter: Payer: Self-pay | Admitting: Internal Medicine

## 2022-02-27 ENCOUNTER — Encounter: Payer: Self-pay | Admitting: Oncology

## 2022-02-27 DIAGNOSIS — Z9181 History of falling: Secondary | ICD-10-CM

## 2022-02-27 DIAGNOSIS — I493 Ventricular premature depolarization: Secondary | ICD-10-CM

## 2022-02-27 DIAGNOSIS — C7951 Secondary malignant neoplasm of bone: Secondary | ICD-10-CM | POA: Diagnosis not present

## 2022-02-27 DIAGNOSIS — I6611 Occlusion and stenosis of right anterior cerebral artery: Secondary | ICD-10-CM | POA: Diagnosis not present

## 2022-02-27 DIAGNOSIS — C7940 Secondary malignant neoplasm of unspecified part of nervous system: Secondary | ICD-10-CM | POA: Diagnosis not present

## 2022-02-27 DIAGNOSIS — N400 Enlarged prostate without lower urinary tract symptoms: Secondary | ICD-10-CM | POA: Diagnosis not present

## 2022-02-27 DIAGNOSIS — G8254 Quadriplegia, C5-C7 incomplete: Secondary | ICD-10-CM | POA: Diagnosis not present

## 2022-02-27 DIAGNOSIS — N319 Neuromuscular dysfunction of bladder, unspecified: Secondary | ICD-10-CM | POA: Diagnosis not present

## 2022-02-27 DIAGNOSIS — Z7982 Long term (current) use of aspirin: Secondary | ICD-10-CM

## 2022-02-27 DIAGNOSIS — M1990 Unspecified osteoarthritis, unspecified site: Secondary | ICD-10-CM | POA: Diagnosis not present

## 2022-02-27 DIAGNOSIS — C61 Malignant neoplasm of prostate: Secondary | ICD-10-CM | POA: Diagnosis not present

## 2022-02-27 DIAGNOSIS — D509 Iron deficiency anemia, unspecified: Secondary | ICD-10-CM | POA: Diagnosis not present

## 2022-02-27 DIAGNOSIS — M4802 Spinal stenosis, cervical region: Secondary | ICD-10-CM | POA: Diagnosis not present

## 2022-02-27 DIAGNOSIS — Z960 Presence of urogenital implants: Secondary | ICD-10-CM

## 2022-02-27 DIAGNOSIS — Z79899 Other long term (current) drug therapy: Secondary | ICD-10-CM

## 2022-02-27 DIAGNOSIS — Z87440 Personal history of urinary (tract) infections: Secondary | ICD-10-CM

## 2022-02-27 DIAGNOSIS — I959 Hypotension, unspecified: Secondary | ICD-10-CM | POA: Diagnosis not present

## 2022-02-27 DIAGNOSIS — Z8701 Personal history of pneumonia (recurrent): Secondary | ICD-10-CM

## 2022-02-27 DIAGNOSIS — I471 Supraventricular tachycardia, unspecified: Secondary | ICD-10-CM

## 2022-02-27 DIAGNOSIS — Z8673 Personal history of transient ischemic attack (TIA), and cerebral infarction without residual deficits: Secondary | ICD-10-CM

## 2022-02-27 DIAGNOSIS — Z9089 Acquired absence of other organs: Secondary | ICD-10-CM

## 2022-02-27 DIAGNOSIS — F419 Anxiety disorder, unspecified: Secondary | ICD-10-CM | POA: Diagnosis not present

## 2022-02-27 DIAGNOSIS — Z86718 Personal history of other venous thrombosis and embolism: Secondary | ICD-10-CM

## 2022-02-27 DIAGNOSIS — Z466 Encounter for fitting and adjustment of urinary device: Secondary | ICD-10-CM

## 2022-02-27 DIAGNOSIS — Z993 Dependence on wheelchair: Secondary | ICD-10-CM

## 2022-02-27 DIAGNOSIS — Z955 Presence of coronary angioplasty implant and graft: Secondary | ICD-10-CM

## 2022-02-27 DIAGNOSIS — Z96612 Presence of left artificial shoulder joint: Secondary | ICD-10-CM

## 2022-02-27 DIAGNOSIS — I10 Essential (primary) hypertension: Secondary | ICD-10-CM | POA: Diagnosis not present

## 2022-02-27 DIAGNOSIS — G8929 Other chronic pain: Secondary | ICD-10-CM

## 2022-02-27 DIAGNOSIS — E039 Hypothyroidism, unspecified: Secondary | ICD-10-CM | POA: Diagnosis not present

## 2022-02-27 DIAGNOSIS — Z981 Arthrodesis status: Secondary | ICD-10-CM

## 2022-02-27 DIAGNOSIS — M542 Cervicalgia: Secondary | ICD-10-CM

## 2022-02-27 DIAGNOSIS — Z96643 Presence of artificial hip joint, bilateral: Secondary | ICD-10-CM

## 2022-02-27 DIAGNOSIS — N200 Calculus of kidney: Secondary | ICD-10-CM

## 2022-02-27 DIAGNOSIS — K219 Gastro-esophageal reflux disease without esophagitis: Secondary | ICD-10-CM | POA: Diagnosis not present

## 2022-02-27 DIAGNOSIS — D630 Anemia in neoplastic disease: Secondary | ICD-10-CM | POA: Diagnosis not present

## 2022-02-27 NOTE — Telephone Encounter (Signed)
Please see pt's mssg

## 2022-02-27 NOTE — Telephone Encounter (Signed)
FYI ladies, pt's wife wanting auth for Eligard/ MRI to be done. I see appt for Scan is until March. I did reply to pt in a separate mychart message that she sent, just wanted to give you guys a heads up.

## 2022-02-28 DIAGNOSIS — N319 Neuromuscular dysfunction of bladder, unspecified: Secondary | ICD-10-CM | POA: Diagnosis not present

## 2022-02-28 DIAGNOSIS — I959 Hypotension, unspecified: Secondary | ICD-10-CM | POA: Diagnosis not present

## 2022-02-28 DIAGNOSIS — N400 Enlarged prostate without lower urinary tract symptoms: Secondary | ICD-10-CM | POA: Diagnosis not present

## 2022-02-28 DIAGNOSIS — I6611 Occlusion and stenosis of right anterior cerebral artery: Secondary | ICD-10-CM | POA: Diagnosis not present

## 2022-02-28 DIAGNOSIS — E039 Hypothyroidism, unspecified: Secondary | ICD-10-CM | POA: Diagnosis not present

## 2022-02-28 DIAGNOSIS — I10 Essential (primary) hypertension: Secondary | ICD-10-CM | POA: Diagnosis not present

## 2022-02-28 DIAGNOSIS — D630 Anemia in neoplastic disease: Secondary | ICD-10-CM | POA: Diagnosis not present

## 2022-02-28 DIAGNOSIS — M4802 Spinal stenosis, cervical region: Secondary | ICD-10-CM | POA: Diagnosis not present

## 2022-02-28 DIAGNOSIS — C61 Malignant neoplasm of prostate: Secondary | ICD-10-CM | POA: Diagnosis not present

## 2022-02-28 DIAGNOSIS — K219 Gastro-esophageal reflux disease without esophagitis: Secondary | ICD-10-CM | POA: Diagnosis not present

## 2022-02-28 DIAGNOSIS — C7940 Secondary malignant neoplasm of unspecified part of nervous system: Secondary | ICD-10-CM | POA: Diagnosis not present

## 2022-02-28 DIAGNOSIS — D509 Iron deficiency anemia, unspecified: Secondary | ICD-10-CM | POA: Diagnosis not present

## 2022-02-28 DIAGNOSIS — M1990 Unspecified osteoarthritis, unspecified site: Secondary | ICD-10-CM | POA: Diagnosis not present

## 2022-02-28 DIAGNOSIS — G8254 Quadriplegia, C5-C7 incomplete: Secondary | ICD-10-CM | POA: Diagnosis not present

## 2022-02-28 DIAGNOSIS — C7951 Secondary malignant neoplasm of bone: Secondary | ICD-10-CM | POA: Diagnosis not present

## 2022-02-28 DIAGNOSIS — F419 Anxiety disorder, unspecified: Secondary | ICD-10-CM | POA: Diagnosis not present

## 2022-03-04 DIAGNOSIS — E039 Hypothyroidism, unspecified: Secondary | ICD-10-CM | POA: Diagnosis not present

## 2022-03-04 DIAGNOSIS — M4802 Spinal stenosis, cervical region: Secondary | ICD-10-CM | POA: Diagnosis not present

## 2022-03-04 DIAGNOSIS — I6611 Occlusion and stenosis of right anterior cerebral artery: Secondary | ICD-10-CM | POA: Diagnosis not present

## 2022-03-04 DIAGNOSIS — G8254 Quadriplegia, C5-C7 incomplete: Secondary | ICD-10-CM | POA: Diagnosis not present

## 2022-03-04 DIAGNOSIS — D509 Iron deficiency anemia, unspecified: Secondary | ICD-10-CM | POA: Diagnosis not present

## 2022-03-04 DIAGNOSIS — I959 Hypotension, unspecified: Secondary | ICD-10-CM | POA: Diagnosis not present

## 2022-03-04 DIAGNOSIS — C61 Malignant neoplasm of prostate: Secondary | ICD-10-CM | POA: Diagnosis not present

## 2022-03-04 DIAGNOSIS — K219 Gastro-esophageal reflux disease without esophagitis: Secondary | ICD-10-CM | POA: Diagnosis not present

## 2022-03-04 DIAGNOSIS — D630 Anemia in neoplastic disease: Secondary | ICD-10-CM | POA: Diagnosis not present

## 2022-03-04 DIAGNOSIS — I10 Essential (primary) hypertension: Secondary | ICD-10-CM | POA: Diagnosis not present

## 2022-03-04 DIAGNOSIS — N400 Enlarged prostate without lower urinary tract symptoms: Secondary | ICD-10-CM | POA: Diagnosis not present

## 2022-03-04 DIAGNOSIS — N319 Neuromuscular dysfunction of bladder, unspecified: Secondary | ICD-10-CM | POA: Diagnosis not present

## 2022-03-04 DIAGNOSIS — F419 Anxiety disorder, unspecified: Secondary | ICD-10-CM | POA: Diagnosis not present

## 2022-03-04 DIAGNOSIS — M1990 Unspecified osteoarthritis, unspecified site: Secondary | ICD-10-CM | POA: Diagnosis not present

## 2022-03-04 DIAGNOSIS — C7951 Secondary malignant neoplasm of bone: Secondary | ICD-10-CM | POA: Diagnosis not present

## 2022-03-04 DIAGNOSIS — C7940 Secondary malignant neoplasm of unspecified part of nervous system: Secondary | ICD-10-CM | POA: Diagnosis not present

## 2022-03-06 DIAGNOSIS — K219 Gastro-esophageal reflux disease without esophagitis: Secondary | ICD-10-CM | POA: Diagnosis not present

## 2022-03-06 DIAGNOSIS — N400 Enlarged prostate without lower urinary tract symptoms: Secondary | ICD-10-CM | POA: Diagnosis not present

## 2022-03-06 DIAGNOSIS — D509 Iron deficiency anemia, unspecified: Secondary | ICD-10-CM | POA: Diagnosis not present

## 2022-03-06 DIAGNOSIS — E039 Hypothyroidism, unspecified: Secondary | ICD-10-CM | POA: Diagnosis not present

## 2022-03-06 DIAGNOSIS — G8254 Quadriplegia, C5-C7 incomplete: Secondary | ICD-10-CM | POA: Diagnosis not present

## 2022-03-06 DIAGNOSIS — N319 Neuromuscular dysfunction of bladder, unspecified: Secondary | ICD-10-CM | POA: Diagnosis not present

## 2022-03-06 DIAGNOSIS — C61 Malignant neoplasm of prostate: Secondary | ICD-10-CM | POA: Diagnosis not present

## 2022-03-06 DIAGNOSIS — C7951 Secondary malignant neoplasm of bone: Secondary | ICD-10-CM | POA: Diagnosis not present

## 2022-03-06 DIAGNOSIS — C7940 Secondary malignant neoplasm of unspecified part of nervous system: Secondary | ICD-10-CM | POA: Diagnosis not present

## 2022-03-06 DIAGNOSIS — D630 Anemia in neoplastic disease: Secondary | ICD-10-CM | POA: Diagnosis not present

## 2022-03-06 DIAGNOSIS — M1990 Unspecified osteoarthritis, unspecified site: Secondary | ICD-10-CM | POA: Diagnosis not present

## 2022-03-06 DIAGNOSIS — F419 Anxiety disorder, unspecified: Secondary | ICD-10-CM | POA: Diagnosis not present

## 2022-03-06 DIAGNOSIS — I6611 Occlusion and stenosis of right anterior cerebral artery: Secondary | ICD-10-CM | POA: Diagnosis not present

## 2022-03-06 DIAGNOSIS — I10 Essential (primary) hypertension: Secondary | ICD-10-CM | POA: Diagnosis not present

## 2022-03-06 DIAGNOSIS — M4802 Spinal stenosis, cervical region: Secondary | ICD-10-CM | POA: Diagnosis not present

## 2022-03-06 DIAGNOSIS — I959 Hypotension, unspecified: Secondary | ICD-10-CM | POA: Diagnosis not present

## 2022-03-07 ENCOUNTER — Inpatient Hospital Stay: Payer: BC Managed Care – PPO | Attending: Anatomic Pathology & Clinical Pathology

## 2022-03-07 ENCOUNTER — Encounter: Payer: Self-pay | Admitting: Oncology

## 2022-03-07 ENCOUNTER — Inpatient Hospital Stay (HOSPITAL_BASED_OUTPATIENT_CLINIC_OR_DEPARTMENT_OTHER): Payer: BC Managed Care – PPO | Admitting: Oncology

## 2022-03-07 VITALS — BP 141/87 | HR 73 | Temp 98.0°F

## 2022-03-07 DIAGNOSIS — Z801 Family history of malignant neoplasm of trachea, bronchus and lung: Secondary | ICD-10-CM | POA: Insufficient documentation

## 2022-03-07 DIAGNOSIS — E039 Hypothyroidism, unspecified: Secondary | ICD-10-CM | POA: Insufficient documentation

## 2022-03-07 DIAGNOSIS — M47816 Spondylosis without myelopathy or radiculopathy, lumbar region: Secondary | ICD-10-CM | POA: Diagnosis not present

## 2022-03-07 DIAGNOSIS — R609 Edema, unspecified: Secondary | ICD-10-CM | POA: Diagnosis not present

## 2022-03-07 DIAGNOSIS — G8250 Quadriplegia, unspecified: Secondary | ICD-10-CM | POA: Insufficient documentation

## 2022-03-07 DIAGNOSIS — C61 Malignant neoplasm of prostate: Secondary | ICD-10-CM | POA: Insufficient documentation

## 2022-03-07 DIAGNOSIS — Z86718 Personal history of other venous thrombosis and embolism: Secondary | ICD-10-CM | POA: Insufficient documentation

## 2022-03-07 DIAGNOSIS — C7940 Secondary malignant neoplasm of unspecified part of nervous system: Secondary | ICD-10-CM

## 2022-03-07 DIAGNOSIS — Z823 Family history of stroke: Secondary | ICD-10-CM | POA: Insufficient documentation

## 2022-03-07 DIAGNOSIS — M4802 Spinal stenosis, cervical region: Secondary | ICD-10-CM | POA: Diagnosis not present

## 2022-03-07 DIAGNOSIS — Z8349 Family history of other endocrine, nutritional and metabolic diseases: Secondary | ICD-10-CM | POA: Insufficient documentation

## 2022-03-07 DIAGNOSIS — R5383 Other fatigue: Secondary | ICD-10-CM | POA: Insufficient documentation

## 2022-03-07 DIAGNOSIS — Z803 Family history of malignant neoplasm of breast: Secondary | ICD-10-CM | POA: Diagnosis not present

## 2022-03-07 DIAGNOSIS — I700 Atherosclerosis of aorta: Secondary | ICD-10-CM | POA: Insufficient documentation

## 2022-03-07 DIAGNOSIS — Z87442 Personal history of urinary calculi: Secondary | ICD-10-CM | POA: Diagnosis not present

## 2022-03-07 DIAGNOSIS — M48061 Spinal stenosis, lumbar region without neurogenic claudication: Secondary | ICD-10-CM | POA: Insufficient documentation

## 2022-03-07 DIAGNOSIS — Z8249 Family history of ischemic heart disease and other diseases of the circulatory system: Secondary | ICD-10-CM | POA: Diagnosis not present

## 2022-03-07 DIAGNOSIS — C7951 Secondary malignant neoplasm of bone: Secondary | ICD-10-CM | POA: Insufficient documentation

## 2022-03-07 DIAGNOSIS — K219 Gastro-esophageal reflux disease without esophagitis: Secondary | ICD-10-CM | POA: Insufficient documentation

## 2022-03-07 DIAGNOSIS — Z814 Family history of other substance abuse and dependence: Secondary | ICD-10-CM | POA: Diagnosis not present

## 2022-03-07 DIAGNOSIS — I10 Essential (primary) hypertension: Secondary | ICD-10-CM | POA: Insufficient documentation

## 2022-03-07 DIAGNOSIS — Z79899 Other long term (current) drug therapy: Secondary | ICD-10-CM | POA: Insufficient documentation

## 2022-03-07 DIAGNOSIS — Z5111 Encounter for antineoplastic chemotherapy: Secondary | ICD-10-CM | POA: Diagnosis present

## 2022-03-07 LAB — COMPREHENSIVE METABOLIC PANEL
ALT: 24 U/L (ref 0–44)
AST: 31 U/L (ref 15–41)
Albumin: 3.7 g/dL (ref 3.5–5.0)
Alkaline Phosphatase: 101 U/L (ref 38–126)
Anion gap: 9 (ref 5–15)
BUN: 9 mg/dL (ref 8–23)
CO2: 31 mmol/L (ref 22–32)
Calcium: 9 mg/dL (ref 8.9–10.3)
Chloride: 98 mmol/L (ref 98–111)
Creatinine, Ser: 0.5 mg/dL — ABNORMAL LOW (ref 0.61–1.24)
GFR, Estimated: >60 mL/min (ref >60)
Glucose, Bld: 107 mg/dL — ABNORMAL HIGH (ref 70–99)
Potassium: 3.4 mmol/L — ABNORMAL LOW (ref 3.5–5.1)
Sodium: 138 mmol/L (ref 135–145)
Total Bilirubin: 0.9 mg/dL (ref 0.3–1.2)
Total Protein: 6.9 g/dL (ref 6.5–8.1)

## 2022-03-07 LAB — CBC WITH DIFFERENTIAL/PLATELET
Abs Immature Granulocytes: 0.01 10*3/uL (ref 0.00–0.07)
Basophils Absolute: 0 10*3/uL (ref 0.0–0.1)
Basophils Relative: 1 %
Eosinophils Absolute: 0.1 10*3/uL (ref 0.0–0.5)
Eosinophils Relative: 2 %
HCT: 41.6 % (ref 39.0–52.0)
Hemoglobin: 14.1 g/dL (ref 13.0–17.0)
Immature Granulocytes: 0 %
Lymphocytes Relative: 21 %
Lymphs Abs: 0.8 10*3/uL (ref 0.7–4.0)
MCH: 31 pg (ref 26.0–34.0)
MCHC: 33.9 g/dL (ref 30.0–36.0)
MCV: 91.4 fL (ref 80.0–100.0)
Monocytes Absolute: 0.3 10*3/uL (ref 0.1–1.0)
Monocytes Relative: 8 %
Neutro Abs: 2.7 10*3/uL (ref 1.7–7.7)
Neutrophils Relative %: 68 %
Platelets: 316 10*3/uL (ref 150–400)
RBC: 4.55 MIL/uL (ref 4.22–5.81)
RDW: 16.6 % — ABNORMAL HIGH (ref 11.5–15.5)
WBC: 3.9 10*3/uL — ABNORMAL LOW (ref 4.0–10.5)
nRBC: 0 % (ref 0.0–0.2)

## 2022-03-07 LAB — PSA: Prostatic Specific Antigen: 6.01 ng/mL — ABNORMAL HIGH (ref 0.00–4.00)

## 2022-03-07 NOTE — Progress Notes (Signed)
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 Currently on chemoprevention therapy with Eligard.  Status post palliative radiation to spine. PMSA PET scan showed partial response. Patient is not a candidate for chemotherapy with docetaxel. Labs are reviewed and discussed with patient.  He tolerates Xtandi 160 mg daily, continue.   Fatigue/brain fog could be due to CNS side effects. Manageable.    History of deep vein thrombosis (DVT) of lower extremity  off Eliquis due to interaction with Xtandi Continue Aspirin 162 mg daily for prophylaxis.  Chronic incomplete quadriplegia ([place]) Follow up with neurology Dr.Vaslow today Continue physical therapy.    Orders Placed This Encounter  Procedures   CBC with Differential/Platelet    Standing Status:   Future    Standing Expiration Date:   03/08/2023   Comprehensive metabolic panel    Standing Status:   Future    Standing Expiration Date:   03/07/2023   PSA    Standing Status:   Future    Standing Expiration Date:   03/08/2023   Follow up 6 weeks   All questions were answered. The patient knows to call the clinic with any problems, questions or concerns.  Earlie Server, MD, PhD [facility] Hematology Oncology 03/07/2022      CHIEF COMPLAINTS/PURPOSE OF CONSULTATION:  Metastatic prostate cancer   HISTORY OF PRESENTING ILLNESS:  Jason [name] 67 y.o. male presents to establish care for metastatic prostate cancer I have reviewed his chart and materials related to his cancer extensively and collaborated history with  the patient. 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  Associated epidural disease greater at the C6 where there is marked canal stenosis without cord compression.  Left greater than right foraminal effacement due to the extraosseous disease.   10/03/2021 Imaging   MRI left shoulder showed High-riding humeral head with massive full-thickness tear of the entire supraspinatus tendon and the anterior 50% of the infraspinatus tendon. Additional partial-thickness articular sided tearing of the posterior infraspinatus. Moderate to high-grade supraspinatus and infraspinatus muscle atrophy suggests these tears  are chronic. Partial-thickness tearing of the superior greater than inferior  aspects of the subscapularis tendon footprint. Mild subscapularis muscle atrophy.  Moderate degenerative changes of the acromioclavicular joint. Moderate glenohumeral cartilage degenerative changes.   10/05/2021 Imaging   CT chest abdomen pelvis with contrast showed  Moderate severity retroperitoneal and pelvic lymphadenopathy,consistent with metastatic disease. Small lytic areas at the levels of L4 and S1, with diffusely sclerotic changes involving the second and fourth right ribs, sacrum and left iliac bone. 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  Sigmoid diverticulosis. Aortic atherosclerosis   10/05/2021 Imaging   CT cervical spine without contrast Extensive heterogeneous sclerotic lesions throughout the cervical spine compatible with metastatic disease to the bone.  Mixed sclerotic and  lytic  changes in the posterior elements at C5 and C6.Extraosseous tumor at C5 and C6 is better appreciated on the MRI scan.   10/05/2021 Tumor Marker   PSA >1500   10/06/2021 Cancer Staging   Staging form: Prostate, AJCC 8th Edition - Clinical stage from 10/06/2021: Stage IVB (cN1, pM1c, PSA: 1500) - Signed by Earlie Server, MD on 12/04/2021 Stage prefix: Initial diagnosis Prostate specific antigen (PSA) range: 20 or greater   10/08/2021 Imaging   MRI thoracic spine and the lumbar spine with and without contrast 1. 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 2. 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 3. Additional epidural tumor along the posterior endplates at U9,W11, and T12 without high-grade spinal canal stenosis or cord compression. 4. Extensive epidural tumor in the lumbar spine is greatest in thickness at L1 without high-grade spinal canal stenosis at this level; however, superimposed on pre-existing degenerative changes lower in the lumbar spine results in severe spinal canal stenosis with cauda equina nerve root compression at L3-L4 and L4-L5. Multilevel neural foraminal stenosis is detailed above. 5. Postsurgical changes reflecting C6 laminectomy unchanged spinal canal stenosis compared to the postoperative study from 1 day prior.No definite cord signal abnormality. 6. Extramedullary fluid collection along the dorsal aspect of the cord beginning at T9-T10 extending into the lumbar spine is likely subdural in location but is of uncertain etiology; evolving blood products not excluded. The collection anteriorly displaces the cord and cauda equina nerve roots without frank cord compression or signal abnormality. There is no peripheral enhancement to suggest abscess. 7. Prevertebral edema in the  cervical spine which may be postsurgical in nature. 8. New bilateral lower lobe consolidations may reflect atelectasis or developing pneumonia/aspiration.   10/08/2021 Imaging   MRI brain with and without contrast showed 1. 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 There is intracranial extension of tumor along the right frontal convexity measuring up to 5 mm in thickness without mass effect on the underlying brain parenchyma or midline shift. Extracranial extension of tumor in this location measures up to approximately 3 mm. 2. Additional extracranial extension of tumor into the scalp along the vertex measuring up to 7 mm in thickness. 3. Small focus of diffusion restriction in the right parietal cortex most in keeping with a small acute infarct. 4. No evidence of parenchymal metastatic disease.   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 10/06/2021, patient underwent C6 cervical laminectomy and resection of tumor.  Pathology came back positive for metastatic prostate cancer. 8/14 developed significant bilateral upper and lower extremity weakness with hypotension requiring transfer to the ICU, pressors, steroid, respiratory failure on mechanical ventilation. Later patient was stabilized and extubated.  8/16 Firmagon loading dose '240mg'$   9/15 Eligard 22.'5mg'$  given inpatient during his rehab admission.     10/2021 -  Radiation Therapy   Palliative radiation to spine.    12/04/2021 Tumor Marker   PSA 67.09   01/13/2022 Tumor Marker   PSA 43.95   01/23/2022 -  Chemotherapy   Started on Xtandi '160mg'$  daily.     INTERVAL HISTORY JAC ROMULUS is a 67 y.o. male who has above history reviewed by me today presents for follow up visit for metastatic prostate cancer.  Patient was accompanied by wife.  Patient has chronic bilateral lower  extremity extremity weakness, generalized left upper extremity weakness.  He gets physical therapy.Marland Kitchen He takes Xtandi '160mg'$  daily.overall tolerates well, except feeling more fatigued, brain fog    MEDICAL HISTORY:  Past Medical History:  Diagnosis Date   Allergy    Arthritis    osteoarthritis   GERD (gastroesophageal reflux disease)    History of kidney stones    Hypertension    Hypothyroidism    Thyroid disease    Hyperthyroidism s/p radioactive iodine ablation    SURGICAL HISTORY: Past Surgical History:  Procedure Laterality Date   CARDIAC CATHETERIZATION  03/2000   HERNIA REPAIR  8563   umbilical   LEFT HEART CATH AND CORONARY ANGIOGRAPHY Left 03/20/2020   Procedure: LEFT HEART CATH AND CORONARY ANGIOGRAPHY;  Surgeon: Nelva Bush, MD;  Location: [place] [facility];  Service: Cardiovascular;  Laterality: Left;   POSTERIOR CERVICAL FUSION/FORAMINOTOMY N/A 10/06/2021   Procedure: POSTERIOR CERVICAL FUSION/ FORAMINOTOMY LEVEL 3;  Surgeon: Meade Maw, MD;  Location: [facility];  Service: Neurosurgery;  Laterality: N/A;  will need monitoring   RADIOLOGY WITH ANESTHESIA Left 04/18/2021   Procedure: MRI SHOULDER WITHOUT CONTRAST WITH ANESTHESIA;  Surgeon: Radiologist, Medication, MD;  Location: [place];  Service: Radiology;  Laterality: Left;   RADIOLOGY WITH ANESTHESIA N/A 10/03/2021   Procedure: MRI CERVICAL SPINE WITH ANESTHESIA;  Surgeon: Radiologist, Medication, MD;  Location: [place];  Service: Radiology;  Laterality: N/A;   RADIOLOGY WITH ANESTHESIA N/A 01/30/2022   Procedure: MRI CERVICAL SPINE WITH AND WITOHOUT CONTRAST WITH ANESTHESIA;  Surgeon: Radiologist, Medication, MD;  Location: [place];  Service: Radiology;  Laterality: N/A;   REVERSE SHOULDER ARTHROPLASTY Left    ROTATOR CUFF REPAIR  11/2010   Dr [name] Heck   TONSILLECTOMY     TOTAL HIP ARTHROPLASTY Right 02/24/2009   TOTAL HIP ARTHROPLASTY Left 06/2016   Dr Harlow Mares    SOCIAL HISTORY: Social History    Socioeconomic History   Marital status: Married    Spouse name: [name]    Number of children: 0   Years of education: Not on file   Highest education level: Not on file  Occupational History   Occupation: Therapist, occupational: [employer]  Tobacco Use   Smoking status: Never    Passive exposure: Never   Smokeless tobacco: Never  Vaping Use   Vaping Use: Never used  Substance and Sexual Activity   Alcohol use: Not Currently    Comment: Alcohol once every few weeks/months   Drug use: No   Sexual activity: Yes  Other Topics Concern   Not on file  Social History Narrative   Lives at home with spouse.    Social Determinants of Health   Financial Resource Strain: Not on file  Food Insecurity: Not on file  Transportation Needs: Not on file  Physical Activity: Not on file  Stress: Not on file  Social Connections: Not on file  Intimate Partner Violence: Not on file    FAMILY HISTORY: Family History  Problem Relation Age of Onset   Hypertension Mother    Stroke Mother    Gout Father    Heart attack Father 63   Heart disease Sister    Lung cancer Sister    Drug abuse Sister    Breast cancer Paternal Aunt    Lung cancer Maternal Grandmother    Colon cancer Neg Hx    Esophageal cancer Neg Hx    Rectal  cancer Neg Hx    Stomach cancer Neg Hx     ALLERGIES:  has No Known Allergies.  MEDICATIONS:  Current Outpatient Medications  Medication Sig Dispense Refill   acetaminophen (TYLENOL) 325 MG tablet Take 1-2 tablets (325-650 mg total) by mouth every 4 (four) hours as needed for mild pain.     ascorbic acid (VITAMIN C) 500 MG tablet Take 1 tablet (500 mg total) by mouth daily. 30 tablet 0   aspirin EC 81 MG tablet Take 1 tablet (81 mg total) by mouth daily. Swallow whole. (Patient taking differently: Take 162 mg by mouth daily. Swallow whole.) 30 tablet 12   atorvastatin (LIPITOR) 40 MG tablet Take 1 tablet (40 mg total) by mouth daily. 90 tablet 3   bisacodyl  (DULCOLAX) 10 MG suppository Place 1 suppository (10 mg total) rectally daily as needed for moderate constipation. (Patient taking differently: Place 10 mg rectally daily.) 12 suppository 0   cetirizine (ZYRTEC) 10 MG tablet Take 10 mg by mouth at bedtime.     clonazePAM (KLONOPIN) 0.5 MG tablet TAKE 1 TABLET BY MOUTH TWICE A DAY 60 tablet 0   enzalutamide (XTANDI) 40 MG tablet Take 4 tablets (160 mg total) by mouth daily. 120 tablet 1   famotidine (PEPCID) 20 MG tablet Take 1 tablet (20 mg total) by mouth daily. 30 tablet 0   FLUoxetine (PROZAC) 20 MG capsule Take 1 capsule (20 mg total) by mouth at bedtime. 90 capsule 3   fluticasone (FLONASE) 50 MCG/ACT nasal spray Place 1 spray into both nostrils daily. 16 g 0   levothyroxine (SYNTHROID) 88 MCG tablet Take 1 tablet (88 mcg total) by mouth daily. 30 tablet 0   Multiple Vitamin (MULTIVITAMIN) tablet Take 1 tablet by mouth daily.     potassium chloride (KLOR-CON M) 10 MEQ tablet Take 1 tablet (10 mEq total) by mouth at bedtime. 90 tablet 3   sennosides-docusate sodium (SENOKOT-S) 8.6-50 MG tablet Take 2 tablets by mouth daily. 60 tablet 11   tamsulosin (FLOMAX) 0.4 MG CAPS capsule Take 1 capsule (0.4 mg total) by mouth daily after supper. 30 capsule 0   No current facility-administered medications for this visit.    Review of Systems  Constitutional:  Positive for fatigue. Negative for appetite change and chills.  HENT:   Negative for hearing loss.   Eyes:  Negative for icterus.  Respiratory:  Negative for cough and shortness of breath.   Cardiovascular:  Negative for chest pain.  Gastrointestinal:  Negative for abdominal pain and blood in stool.  Genitourinary:         + Foley catheter  Musculoskeletal:  Negative for back pain.  Skin:  Negative for rash.  Neurological:  Positive for extremity weakness.     PHYSICAL EXAMINATION: ECOG PERFORMANCE STATUS: 2 - Symptomatic, <50% confined to bed  Vitals:   03/07/22 1000  BP: (!)  141/87  Pulse: 73  Temp: 98 F (36.7 C)  SpO2: 99%   There were no vitals filed for this visit.    Physical Exam Constitutional:      General: He is not in acute distress.    Appearance: He is not diaphoretic.     Comments: Patient sits in wheelchair.  HENT:     Head: Normocephalic and atraumatic.     Nose: Nose normal.     Mouth/Throat:     Pharynx: No oropharyngeal exudate.  Eyes:     General: No scleral icterus. Cardiovascular:     Rate and  Rhythm: Normal rate.  Pulmonary:     Effort: Pulmonary effort is normal. No respiratory distress.  Abdominal:     General: There is no distension.  Musculoskeletal:        General: Normal range of motion.     Cervical back: Normal range of motion.  Skin:    General: Skin is warm and dry.     Findings: No erythema.  Neurological:     Mental Status: He is alert and oriented to person, place, and time.     Cranial Nerves: No cranial nerve deficit.     Motor: No abnormal muscle tone.     Comments: Decreased lower extremity strength.  Psychiatric:        Mood and Affect: Mood and affect normal.      LABORATORY DATA:  I have reviewed the data as listed    Latest Ref Rng & Units 03/07/2022    9:41 AM 02/07/2022    9:23 AM 01/22/2022    4:45 AM  CBC  WBC 4.0 - 10.5 K/uL 3.9  6.0  5.5   Hemoglobin 13.0 - 17.0 g/dL 14.1  14.1  11.1   Hematocrit 39.0 - 52.0 % 41.6  42.6  34.4   Platelets 150 - 400 K/uL 316  268  179       Latest Ref Rng & Units 03/07/2022    9:41 AM 02/07/2022    9:23 AM 01/22/2022    4:45 AM  CMP  Glucose 70 - 99 mg/dL 107  109  98   BUN 8 - 23 mg/dL '9  10  16   '$ Creatinine 0.61 - 1.24 mg/dL 0.50  0.46  0.39   Sodium 135 - 145 mmol/L 138  139  135   Potassium 3.5 - 5.1 mmol/L 3.4  3.6  3.7   Chloride 98 - 111 mmol/L 98  100  109   CO2 22 - 32 mmol/L '31  26  20   '$ Calcium 8.9 - 10.3 mg/dL 9.0  9.1  8.6   Total Protein 6.5 - 8.1 g/dL 6.9  7.2    Total Bilirubin 0.3 - 1.2 mg/dL 0.9  1.0    Alkaline Phos  38 - 126 U/L 101  142    AST 15 - 41 U/L 31  36    ALT 0 - 44 U/L 24  27       RADIOGRAPHIC STUDIES: I have personally reviewed the radiological images as listed and agreed with the findings in the report. No results found.

## 2022-03-07 NOTE — Assessment & Plan Note (Addendum)
Metastatic castration sensitive prostate cancer, with CNS involvement. Currently on chemoprevention therapy with Eligard.  Status post palliative radiation to spine. PMSA PET scan showed partial response. Patient is not a candidate for chemotherapy with docetaxel. Labs are reviewed and discussed with patient.  He tolerates Xtandi 160 mg daily, continue.   Fatigue/brain fog could be due to CNS side effects. Manageable.

## 2022-03-07 NOTE — Assessment & Plan Note (Signed)
Follow up with neurology Dr.Vaslow today Continue physical therapy.

## 2022-03-07 NOTE — Assessment & Plan Note (Signed)
  off Eliquis due to interaction with Xtandi Continue Aspirin 162 mg daily for prophylaxis.

## 2022-03-10 DIAGNOSIS — D509 Iron deficiency anemia, unspecified: Secondary | ICD-10-CM | POA: Diagnosis not present

## 2022-03-10 DIAGNOSIS — C7940 Secondary malignant neoplasm of unspecified part of nervous system: Secondary | ICD-10-CM | POA: Diagnosis not present

## 2022-03-10 DIAGNOSIS — C7951 Secondary malignant neoplasm of bone: Secondary | ICD-10-CM | POA: Diagnosis not present

## 2022-03-10 DIAGNOSIS — G8254 Quadriplegia, C5-C7 incomplete: Secondary | ICD-10-CM | POA: Diagnosis not present

## 2022-03-10 DIAGNOSIS — M1990 Unspecified osteoarthritis, unspecified site: Secondary | ICD-10-CM | POA: Diagnosis not present

## 2022-03-10 DIAGNOSIS — I959 Hypotension, unspecified: Secondary | ICD-10-CM | POA: Diagnosis not present

## 2022-03-10 DIAGNOSIS — F419 Anxiety disorder, unspecified: Secondary | ICD-10-CM | POA: Diagnosis not present

## 2022-03-10 DIAGNOSIS — C61 Malignant neoplasm of prostate: Secondary | ICD-10-CM | POA: Diagnosis not present

## 2022-03-10 DIAGNOSIS — I6611 Occlusion and stenosis of right anterior cerebral artery: Secondary | ICD-10-CM | POA: Diagnosis not present

## 2022-03-10 DIAGNOSIS — I10 Essential (primary) hypertension: Secondary | ICD-10-CM | POA: Diagnosis not present

## 2022-03-10 DIAGNOSIS — D630 Anemia in neoplastic disease: Secondary | ICD-10-CM | POA: Diagnosis not present

## 2022-03-10 DIAGNOSIS — M4802 Spinal stenosis, cervical region: Secondary | ICD-10-CM | POA: Diagnosis not present

## 2022-03-10 DIAGNOSIS — E039 Hypothyroidism, unspecified: Secondary | ICD-10-CM | POA: Diagnosis not present

## 2022-03-10 DIAGNOSIS — K219 Gastro-esophageal reflux disease without esophagitis: Secondary | ICD-10-CM | POA: Diagnosis not present

## 2022-03-10 DIAGNOSIS — N319 Neuromuscular dysfunction of bladder, unspecified: Secondary | ICD-10-CM | POA: Diagnosis not present

## 2022-03-10 DIAGNOSIS — N400 Enlarged prostate without lower urinary tract symptoms: Secondary | ICD-10-CM | POA: Diagnosis not present

## 2022-03-12 ENCOUNTER — Telehealth: Payer: Self-pay | Admitting: Internal Medicine

## 2022-03-12 DIAGNOSIS — K219 Gastro-esophageal reflux disease without esophagitis: Secondary | ICD-10-CM | POA: Diagnosis not present

## 2022-03-12 DIAGNOSIS — C7951 Secondary malignant neoplasm of bone: Secondary | ICD-10-CM | POA: Diagnosis not present

## 2022-03-12 DIAGNOSIS — D509 Iron deficiency anemia, unspecified: Secondary | ICD-10-CM | POA: Diagnosis not present

## 2022-03-12 DIAGNOSIS — I10 Essential (primary) hypertension: Secondary | ICD-10-CM | POA: Diagnosis not present

## 2022-03-12 DIAGNOSIS — C61 Malignant neoplasm of prostate: Secondary | ICD-10-CM | POA: Diagnosis not present

## 2022-03-12 DIAGNOSIS — D630 Anemia in neoplastic disease: Secondary | ICD-10-CM | POA: Diagnosis not present

## 2022-03-12 DIAGNOSIS — I6611 Occlusion and stenosis of right anterior cerebral artery: Secondary | ICD-10-CM | POA: Diagnosis not present

## 2022-03-12 DIAGNOSIS — N400 Enlarged prostate without lower urinary tract symptoms: Secondary | ICD-10-CM | POA: Diagnosis not present

## 2022-03-12 DIAGNOSIS — F419 Anxiety disorder, unspecified: Secondary | ICD-10-CM | POA: Diagnosis not present

## 2022-03-12 DIAGNOSIS — M4802 Spinal stenosis, cervical region: Secondary | ICD-10-CM | POA: Diagnosis not present

## 2022-03-12 DIAGNOSIS — N319 Neuromuscular dysfunction of bladder, unspecified: Secondary | ICD-10-CM | POA: Diagnosis not present

## 2022-03-12 DIAGNOSIS — E039 Hypothyroidism, unspecified: Secondary | ICD-10-CM | POA: Diagnosis not present

## 2022-03-12 DIAGNOSIS — I959 Hypotension, unspecified: Secondary | ICD-10-CM | POA: Diagnosis not present

## 2022-03-12 DIAGNOSIS — M1990 Unspecified osteoarthritis, unspecified site: Secondary | ICD-10-CM | POA: Diagnosis not present

## 2022-03-12 DIAGNOSIS — G8254 Quadriplegia, C5-C7 incomplete: Secondary | ICD-10-CM | POA: Diagnosis not present

## 2022-03-12 DIAGNOSIS — C7940 Secondary malignant neoplasm of unspecified part of nervous system: Secondary | ICD-10-CM | POA: Diagnosis not present

## 2022-03-12 NOTE — Telephone Encounter (Signed)
Home Health verbal orders Caller Name: [place] Name: [facility] number: [phone]  Requesting OT/PT/Skilled nursing/Social Work/Speech: OT  Reason:  Frequency: one x two  Please forward to [facility] pool or providers CMA

## 2022-03-13 DIAGNOSIS — I959 Hypotension, unspecified: Secondary | ICD-10-CM | POA: Diagnosis not present

## 2022-03-13 DIAGNOSIS — I10 Essential (primary) hypertension: Secondary | ICD-10-CM | POA: Diagnosis not present

## 2022-03-13 DIAGNOSIS — F419 Anxiety disorder, unspecified: Secondary | ICD-10-CM | POA: Diagnosis not present

## 2022-03-13 DIAGNOSIS — I6611 Occlusion and stenosis of right anterior cerebral artery: Secondary | ICD-10-CM | POA: Diagnosis not present

## 2022-03-13 DIAGNOSIS — N400 Enlarged prostate without lower urinary tract symptoms: Secondary | ICD-10-CM | POA: Diagnosis not present

## 2022-03-13 DIAGNOSIS — M4802 Spinal stenosis, cervical region: Secondary | ICD-10-CM | POA: Diagnosis not present

## 2022-03-13 DIAGNOSIS — M1990 Unspecified osteoarthritis, unspecified site: Secondary | ICD-10-CM | POA: Diagnosis not present

## 2022-03-13 DIAGNOSIS — G8254 Quadriplegia, C5-C7 incomplete: Secondary | ICD-10-CM | POA: Diagnosis not present

## 2022-03-13 DIAGNOSIS — C61 Malignant neoplasm of prostate: Secondary | ICD-10-CM | POA: Diagnosis not present

## 2022-03-13 DIAGNOSIS — C7951 Secondary malignant neoplasm of bone: Secondary | ICD-10-CM | POA: Diagnosis not present

## 2022-03-13 DIAGNOSIS — C7940 Secondary malignant neoplasm of unspecified part of nervous system: Secondary | ICD-10-CM | POA: Diagnosis not present

## 2022-03-13 DIAGNOSIS — K219 Gastro-esophageal reflux disease without esophagitis: Secondary | ICD-10-CM | POA: Diagnosis not present

## 2022-03-13 DIAGNOSIS — N319 Neuromuscular dysfunction of bladder, unspecified: Secondary | ICD-10-CM | POA: Diagnosis not present

## 2022-03-13 DIAGNOSIS — D630 Anemia in neoplastic disease: Secondary | ICD-10-CM | POA: Diagnosis not present

## 2022-03-13 DIAGNOSIS — D509 Iron deficiency anemia, unspecified: Secondary | ICD-10-CM | POA: Diagnosis not present

## 2022-03-13 DIAGNOSIS — E039 Hypothyroidism, unspecified: Secondary | ICD-10-CM | POA: Diagnosis not present

## 2022-03-13 NOTE — Telephone Encounter (Signed)
Left verbal orders on verified VM for [facility].

## 2022-03-16 DIAGNOSIS — B9689 Other specified bacterial agents as the cause of diseases classified elsewhere: Secondary | ICD-10-CM | POA: Diagnosis not present

## 2022-03-16 DIAGNOSIS — Z20822 Contact with and (suspected) exposure to covid-19: Secondary | ICD-10-CM | POA: Insufficient documentation

## 2022-03-16 DIAGNOSIS — N390 Urinary tract infection, site not specified: Secondary | ICD-10-CM | POA: Insufficient documentation

## 2022-03-16 DIAGNOSIS — Z8546 Personal history of malignant neoplasm of prostate: Secondary | ICD-10-CM | POA: Diagnosis not present

## 2022-03-16 DIAGNOSIS — I10 Essential (primary) hypertension: Secondary | ICD-10-CM | POA: Insufficient documentation

## 2022-03-16 DIAGNOSIS — E039 Hypothyroidism, unspecified: Secondary | ICD-10-CM | POA: Diagnosis not present

## 2022-03-16 DIAGNOSIS — J189 Pneumonia, unspecified organism: Secondary | ICD-10-CM | POA: Diagnosis not present

## 2022-03-16 DIAGNOSIS — E876 Hypokalemia: Secondary | ICD-10-CM | POA: Insufficient documentation

## 2022-03-16 DIAGNOSIS — R9431 Abnormal electrocardiogram [ECG] [EKG]: Secondary | ICD-10-CM | POA: Diagnosis not present

## 2022-03-16 DIAGNOSIS — R509 Fever, unspecified: Secondary | ICD-10-CM | POA: Diagnosis not present

## 2022-03-16 LAB — CBC
HCT: 39.2 % (ref 39.0–52.0)
Hemoglobin: 13.3 g/dL (ref 13.0–17.0)
MCH: 31 pg (ref 26.0–34.0)
MCHC: 33.9 g/dL (ref 30.0–36.0)
MCV: 91.4 fL (ref 80.0–100.0)
Platelets: 253 10*3/uL (ref 150–400)
RBC: 4.29 MIL/uL (ref 4.22–5.81)
RDW: 16.4 % — ABNORMAL HIGH (ref 11.5–15.5)
WBC: 13.4 10*3/uL — ABNORMAL HIGH (ref 4.0–10.5)
nRBC: 0 % (ref 0.0–0.2)

## 2022-03-16 LAB — BASIC METABOLIC PANEL
Anion gap: 11 (ref 5–15)
BUN: 10 mg/dL (ref 8–23)
CO2: 23 mmol/L (ref 22–32)
Calcium: 8.6 mg/dL — ABNORMAL LOW (ref 8.9–10.3)
Chloride: 101 mmol/L (ref 98–111)
Creatinine, Ser: 0.44 mg/dL — ABNORMAL LOW (ref 0.61–1.24)
GFR, Estimated: >60 mL/min (ref >60)
Glucose, Bld: 127 mg/dL — ABNORMAL HIGH (ref 70–99)
Potassium: 3 mmol/L — ABNORMAL LOW (ref 3.5–5.1)
Sodium: 135 mmol/L (ref 135–145)

## 2022-03-16 NOTE — ED Triage Notes (Signed)
Pt sts that he takes tylenol around the clock but than noticed that he started to have a fever. Pt wife sts that pt did have something like this happen before and it was a UTI due to chronic foley usage.

## 2022-03-16 NOTE — ED Notes (Signed)
First Nurse Note: Pt arrives via [facility] from home with complaints of fevers. Stage 4 pancreatic cancer with mets - active chemo. Pt had a temp of 100.6 oral - tylenol given 1.5 hours ago.  VS with EMS 80s HR 22 RR 168/94

## 2022-03-17 ENCOUNTER — Emergency Department
Admission: EM | Admit: 2022-03-17 | Discharge: 2022-03-17 | Disposition: A | Discharge status: Home / Self Care | Payer: BC Managed Care – PPO | Attending: Emergency Medicine | Admitting: Emergency Medicine

## 2022-03-17 ENCOUNTER — Emergency Department: Payer: BC Managed Care – PPO

## 2022-03-17 DIAGNOSIS — R29898 Other symptoms and signs involving the musculoskeletal system: Secondary | ICD-10-CM | POA: Diagnosis not present

## 2022-03-17 DIAGNOSIS — N390 Urinary tract infection, site not specified: Secondary | ICD-10-CM | POA: Diagnosis not present

## 2022-03-17 DIAGNOSIS — K219 Gastro-esophageal reflux disease without esophagitis: Secondary | ICD-10-CM | POA: Diagnosis not present

## 2022-03-17 DIAGNOSIS — D630 Anemia in neoplastic disease: Secondary | ICD-10-CM | POA: Diagnosis not present

## 2022-03-17 DIAGNOSIS — Z7401 Bed confinement status: Secondary | ICD-10-CM | POA: Diagnosis not present

## 2022-03-17 DIAGNOSIS — G8254 Quadriplegia, C5-C7 incomplete: Secondary | ICD-10-CM | POA: Diagnosis not present

## 2022-03-17 DIAGNOSIS — N319 Neuromuscular dysfunction of bladder, unspecified: Secondary | ICD-10-CM | POA: Diagnosis not present

## 2022-03-17 DIAGNOSIS — D509 Iron deficiency anemia, unspecified: Secondary | ICD-10-CM | POA: Diagnosis not present

## 2022-03-17 DIAGNOSIS — I6611 Occlusion and stenosis of right anterior cerebral artery: Secondary | ICD-10-CM | POA: Diagnosis not present

## 2022-03-17 DIAGNOSIS — C7951 Secondary malignant neoplasm of bone: Secondary | ICD-10-CM | POA: Diagnosis not present

## 2022-03-17 DIAGNOSIS — I10 Essential (primary) hypertension: Secondary | ICD-10-CM | POA: Diagnosis not present

## 2022-03-17 DIAGNOSIS — E039 Hypothyroidism, unspecified: Secondary | ICD-10-CM | POA: Diagnosis not present

## 2022-03-17 DIAGNOSIS — C7940 Secondary malignant neoplasm of unspecified part of nervous system: Secondary | ICD-10-CM | POA: Diagnosis not present

## 2022-03-17 DIAGNOSIS — M4802 Spinal stenosis, cervical region: Secondary | ICD-10-CM | POA: Diagnosis not present

## 2022-03-17 DIAGNOSIS — N400 Enlarged prostate without lower urinary tract symptoms: Secondary | ICD-10-CM | POA: Diagnosis not present

## 2022-03-17 DIAGNOSIS — E876 Hypokalemia: Secondary | ICD-10-CM

## 2022-03-17 DIAGNOSIS — C61 Malignant neoplasm of prostate: Secondary | ICD-10-CM | POA: Diagnosis not present

## 2022-03-17 DIAGNOSIS — M1990 Unspecified osteoarthritis, unspecified site: Secondary | ICD-10-CM | POA: Diagnosis not present

## 2022-03-17 DIAGNOSIS — F419 Anxiety disorder, unspecified: Secondary | ICD-10-CM | POA: Diagnosis not present

## 2022-03-17 DIAGNOSIS — I959 Hypotension, unspecified: Secondary | ICD-10-CM | POA: Diagnosis not present

## 2022-03-17 DIAGNOSIS — J189 Pneumonia, unspecified organism: Secondary | ICD-10-CM | POA: Diagnosis not present

## 2022-03-17 LAB — RESP PANEL BY RT-PCR (RSV, FLU A&B, COVID)  RVPGX2
Influenza A by PCR: NEGATIVE
Influenza B by PCR: NEGATIVE
Resp Syncytial Virus by PCR: NEGATIVE
SARS Coronavirus 2 by RT PCR: NEGATIVE

## 2022-03-17 LAB — URINALYSIS, ROUTINE W REFLEX MICROSCOPIC
Bilirubin Urine: NEGATIVE
Glucose, UA: NEGATIVE mg/dL
Ketones, ur: 5 mg/dL — AB
Nitrite: NEGATIVE
Protein, ur: >=300 mg/dL — AB
RBC / HPF: >50 RBC/hpf — ABNORMAL HIGH (ref 0–5)
Specific Gravity, Urine: 1.031 — ABNORMAL HIGH (ref 1.005–1.030)
Squamous Epithelial / HPF: NONE SEEN /HPF (ref 0–5)
WBC, UA: >50 WBC/hpf — ABNORMAL HIGH (ref 0–5)
pH: 5 (ref 5.0–8.0)

## 2022-03-17 MED ORDER — CEPHALEXIN 500 MG PO CAPS: 500.0000 mg | ORAL_CAPSULE | Freq: Four times a day (QID) | ORAL | 0 refills | Status: DC

## 2022-03-17 MED ORDER — POTASSIUM CHLORIDE CRYS ER 20 MEQ PO TBCR
40.0000 meq | EXTENDED_RELEASE_TABLET | Freq: Once | ORAL | Status: AC
  Administered 2022-03-17: 40 meq via ORAL
  Filled 2022-03-17: qty 2

## 2022-03-17 MED ORDER — SODIUM CHLORIDE 0.9 % IV SOLN
1.0000 g | Freq: Once | INTRAVENOUS | Status: AC
  Administered 2022-03-17: 1 g via INTRAVENOUS
  Filled 2022-03-17: qty 10

## 2022-03-17 NOTE — ED Provider Notes (Signed)
[facility] Provider Note    Event Date/Time   First MD Initiated Contact with Patient 03/17/22 0121     (approximate)   History   Fever   HPI  Jason [name] is a 67 y.o. 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 Patient tells me this evening he started to feel warm asked his wife to take his temperature and it was 100.8.  Had taken Tylenol about an hour before.  He otherwise feels well just a little fatigue denies belly pain denies shortness of breath sore throat headache body aches.  Has been eating and drinking.  Does have chronic indwelling Foley catheter.  He was last changed at the beginning of January.  Patient was admitted for UTI in November and cultures grew Klebsiella, sensitive to cephalosporins.  Patient's wife is at bedside denies any confusion.     Past Medical History:  Diagnosis Date   Allergy    Arthritis    osteoarthritis   GERD (gastroesophageal reflux disease)    History of kidney stones    Hypertension    Hypothyroidism    Thyroid disease    Hyperthyroidism s/p radioactive iodine ablation    Patient Active Problem List   Diagnosis Date Noted   Neurogenic bowel 02/05/2022   Fever 01/20/2022   UTI (urinary tract infection) 01/20/2022   Androgen deprivation therapy 01/13/2022   Neurogenic bladder 12/04/2021   History of deep vein thrombosis (DVT) of lower extremity 12/04/2021   Chronic incomplete quadriplegia ([place]) 10/18/2021   Anemia    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 ([place]) 10/12/2021   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 ([place]) 10/12/2021   Mood disorder ([place]) 10/12/2021   Prostate cancer metastatic to multiple sites ([place])    Hypotension and abnormal strength/sensation  10/07/2021   Cervical spinal stenosis 10/05/2021    Hypertensive urgency 10/05/2021   PSVT (paroxysmal supraventricular tachycardia) 01/25/2021   NSVT (nonsustained ventricular tachycardia) ([place]) 01/25/2021   Hyperlipidemia 01/25/2021   PVC's (premature ventricular contractions) 03/15/2020   BPH (benign prostatic hyperplasia) 11/07/2013   Routine general medical examination at a health care facility 06/24/2010   Hypothyroidism 06/19/2009   OSTEOARTHRITIS 12/19/2008   BACK PAIN, LUMBAR 02/26/2007   Hypertension 09/17/2006   ALLERGIC RHINITIS 09/17/2006   GERD (gastroesophageal reflux disease) 09/17/2006     Physical Exam  Triage Vital Signs: ED Triage Vitals  Enc Vitals Group     BP 03/16/22 2248 (!) 147/89     Pulse Rate 03/16/22 2248 84     Resp 03/16/22 2248 16     Temp 03/16/22 2248 99.6 F (37.6 C)     Temp src --      SpO2 03/16/22 2248 95 %     Weight 03/16/22 2248 179 lb (81.2 kg)     Height --      Head Circumference --      Peak Flow --      Pain Score 03/16/22 2247 0     Pain Loc --      Pain Edu? --      Excl. in [place]? --     Most recent vital signs: Vitals:   03/17/22 0230 03/17/22 0300  BP: 136/76 (!) 126/92  Pulse: 72 77  Resp:  16  Temp:    SpO2: 95% 96%  General: Awake, no distress.  CV:  Good peripheral perfusion.  No peripheral edema Resp:  Normal effort.  Lungs are clear Abd:  No distention.  Abdomen is soft nontender Neuro:             Awake, Alert, Oriented x 3  Other:  Indwelling Foley with yellow urine No rashes  ED Results / Procedures / Treatments  Labs (all labs ordered are listed, but only abnormal results are displayed) Labs Reviewed  URINALYSIS, ROUTINE W REFLEX MICROSCOPIC - Abnormal; Notable for the following components:      Result Value   Color, Urine AMBER (*)    APPearance CLOUDY (*)    Specific Gravity, Urine 1.031 (*)    Hgb urine dipstick LARGE (*)    Ketones, ur 5 (*)    Protein, ur >=300 (*)    Leukocytes,Ua MODERATE (*)    RBC / HPF >50 (*)    WBC, UA >50  (*)    Bacteria, UA MANY (*)    All other components within normal limits  BASIC METABOLIC PANEL - Abnormal; Notable for the following components:   Potassium 3.0 (*)    Glucose, Bld 127 (*)    Creatinine, Ser 0.44 (*)    Calcium 8.6 (*)    All other components within normal limits  CBC - Abnormal; Notable for the following components:   WBC 13.4 (*)    RDW 16.4 (*)    All other components within normal limits  RESP PANEL BY RT-PCR (RSV, FLU A&B, COVID)  RVPGX2  URINE CULTURE     EKG     RADIOLOGY I reviewed and interpreted the CXR which does not show any acute cardiopulmonary process    PROCEDURES:  Critical Care performed: No  .1-3 Lead EKG Interpretation  Performed by: Rada Hay, MD Authorized by: Rada Hay, MD     Interpretation: normal     ECG rate assessment: normal     Rhythm: sinus rhythm     Ectopy: none     The patient is on the cardiac monitor to evaluate for evidence of arrhythmia and/or significant heart rate changes.   MEDICATIONS ORDERED IN ED: Medications  potassium chloride SA (KLOR-CON M) CR tablet 40 mEq (40 mEq Oral Given 03/17/22 0304)  cefTRIAXone (ROCEPHIN) 1 g in sodium chloride 0.9 % 100 mL IVPB (1 g Intravenous New Bag/Given 03/17/22 0304)     IMPRESSION / MDM / ASSESSMENT AND PLAN / ED COURSE  I reviewed the triage vital signs and the nursing notes.                              Patient's presentation is most consistent with acute complicated illness / injury requiring diagnostic workup.  Differential diagnosis includes, but is not limited to, UTI, pyelonephritis, sepsis, viral illness, pneumonia  The patient is a 67 year old male with chronic indwelling Foley catheter secondary to quadriplegia and neurogenic bladder, metastatic prostate cancer presents with fever at home.  Around 9 PM this evening he felt warm and his wife took his temperature was 100.8.  Other than feeling slightly fatigued he has minimal other  complaints denies any cardiopulmonary symptoms upper respiratory symptoms abdominal pain nausea vomiting.  He is eating and drinking.  Patient's vitals are reassuring.  Temp 99.2 on repeat is 98.5.  Satting well with normal blood pressure.  On exam he looks nontoxic abdomen soft lungs clear he is alert and  oriented no obvious skin or soft tissue infection.  Labs are notable for hypokalemia with potassium of 3, leukocytosis 13.  Renal function is normal.  His Foley catheter was exchanged and a fresh sample of urine was sent that has moderate leuks greater than 50 white cells many bacteria greater than 50 red cells.  Patient grew Klebsiella that was sensitive to cephalosporins 2 months ago so was given dose of IV Rocephin in the ED.  COVID and flu test negative chest x-ray is clear.  I think the urine is the most likely source of his infection.  Patient does not appear septic with normal vital signs and looks nontoxic.  I think that he can be treated with a course of outpatient antibiotics.  He is planning to see urology on Wednesday.  Discussed with him that if he starts to have confusion and vomiting feeling more weak or has fevers in 48 hours that he should return to the ED.       FINAL CLINICAL IMPRESSION(S) / ED DIAGNOSES   Final diagnoses:  Urinary tract infection without hematuria, site unspecified  Hypokalemia     Rx / DC Orders   ED Discharge Orders          Ordered    cephALEXin (KEFLEX) 500 MG capsule  4 times daily        03/17/22 0342             Note:  This document was prepared using Dragon voice recognition software and may include unintentional dictation errors.   Rada Hay, MD 03/17/22 [phone]

## 2022-03-17 NOTE — ED Notes (Signed)
called to [facility] for transport home/rep:[email or web address].Marland Kitchen

## 2022-03-17 NOTE — Discharge Instructions (Addendum)
You likely have a urinary tract infection.  You got the first dose of antibiotics through the IV in the emergency department.  Please take the antibiotic 4 times a day for the next 7 days.  If you are still having fever in 48 hours, or you develop confusion vomiting or significant weakness then please return to the emergency department.  Your potassium was low.  Please try to eat foods that are rich in potassium.  Please follow-up with your primary doctor in about a week to have this rechecked.

## 2022-03-19 ENCOUNTER — Ambulatory Visit (INDEPENDENT_AMBULATORY_CARE_PROVIDER_SITE_OTHER): Payer: BC Managed Care – PPO | Admitting: Urology

## 2022-03-19 ENCOUNTER — Encounter: Payer: Self-pay | Admitting: Urology

## 2022-03-19 VITALS — BP 131/86 | HR 73 | Wt 179.0 lb

## 2022-03-19 DIAGNOSIS — C61 Malignant neoplasm of prostate: Secondary | ICD-10-CM | POA: Diagnosis not present

## 2022-03-19 DIAGNOSIS — N319 Neuromuscular dysfunction of bladder, unspecified: Secondary | ICD-10-CM | POA: Diagnosis not present

## 2022-03-19 LAB — URINE CULTURE: Culture: >=100000 — AB

## 2022-03-19 MED ORDER — AMOXICILLIN-POT CLAVULANATE 875-125 MG PO TABS: 1.0000 | ORAL_TABLET | Freq: Two times a day (BID) | ORAL | 0 refills | Status: DC

## 2022-03-19 NOTE — Progress Notes (Signed)
   03/19/2022 12:42 PM   Jason [name] [birth year] [id number]  Reason for visit: Follow up metastatic prostate cancer, urinary retention  HPI: Complex 67 year old male with prolonged hospitalization and rehab stay August through October of 2023.  Briefly, presented with extremity weakness and was found to have PSA >1500, underwent C6 cervical laminectomy and resection of tumor on 10/06/2021 showing metastatic prostate cancer, 8/14 developed significant bilateral upper and lower extremity weakness with hypotension requiring transfer to the ICU, pressors, steroids, and respiratory failure requiring adequate ventilation.  Firmagon loading dose was given on 8/16, and he has continued on ADT.  He also received palliative radiation to the spine.  He is followed by oncology, and also is on enzalutamide(not a candidate for docetaxel).  He has been in urinary retention since that time with Foley catheter felt to be secondary to neurogenic bladder.  He was referred to urology for bladder management options.  His prostate cancer is managed by oncology(Dr. Tasia Catchings), and most recent PSA 6.01 on 03/07/2022 which continues to downtrend from 98 in November, and >1500 at time of diagnosis.  Most of the history is obtained from his wife again today, and I reviewed the recent ER notes extensively.  He has had an indwelling foley since that time changed on a monthly basis by home health.  He has had 2 UTIs, with symptoms of fever, 01/19/2022 with Klebsiella, most recently 03/17/2022 with Enterococcus.  He was discharged on Keflex, and I will change his antibiotics to Augmentin today which would cover both the most recent Enterococcus culture, as well as the prior Klebsiella.  Catheter was changed at that time in the ER on 03/17/2022.  We again reviewed risks and benefits of a voiding trial at this point, and with a recent positive culture, I recommended holding off, especially since he has not been on the appropriate  antibiotic based on the culture results.  He also remains wheelchair-bound.  They are in agreement.  We again reviewed options including urodynamics, suprapubic tube, intermittent catheterization, or continuing Foley catheter changes with tentative plan for voiding trial in 2 to 3 months.  Antibiotics changed to Augmentin x 2 weeks RTC 2 to 3 months to consider voiding trial.  Could also consider discussing suprapubic tube at that time    [name], [place] [address] [place], [place] [address] [phone]

## 2022-03-20 DIAGNOSIS — F419 Anxiety disorder, unspecified: Secondary | ICD-10-CM | POA: Diagnosis not present

## 2022-03-20 DIAGNOSIS — E039 Hypothyroidism, unspecified: Secondary | ICD-10-CM | POA: Diagnosis not present

## 2022-03-20 DIAGNOSIS — G8254 Quadriplegia, C5-C7 incomplete: Secondary | ICD-10-CM | POA: Diagnosis not present

## 2022-03-20 DIAGNOSIS — M1990 Unspecified osteoarthritis, unspecified site: Secondary | ICD-10-CM | POA: Diagnosis not present

## 2022-03-20 DIAGNOSIS — M4802 Spinal stenosis, cervical region: Secondary | ICD-10-CM | POA: Diagnosis not present

## 2022-03-20 DIAGNOSIS — K219 Gastro-esophageal reflux disease without esophagitis: Secondary | ICD-10-CM | POA: Diagnosis not present

## 2022-03-20 DIAGNOSIS — I959 Hypotension, unspecified: Secondary | ICD-10-CM | POA: Diagnosis not present

## 2022-03-20 DIAGNOSIS — I10 Essential (primary) hypertension: Secondary | ICD-10-CM | POA: Diagnosis not present

## 2022-03-20 DIAGNOSIS — N400 Enlarged prostate without lower urinary tract symptoms: Secondary | ICD-10-CM | POA: Diagnosis not present

## 2022-03-20 DIAGNOSIS — I6611 Occlusion and stenosis of right anterior cerebral artery: Secondary | ICD-10-CM | POA: Diagnosis not present

## 2022-03-20 DIAGNOSIS — C7951 Secondary malignant neoplasm of bone: Secondary | ICD-10-CM | POA: Diagnosis not present

## 2022-03-20 DIAGNOSIS — N319 Neuromuscular dysfunction of bladder, unspecified: Secondary | ICD-10-CM | POA: Diagnosis not present

## 2022-03-20 DIAGNOSIS — C7940 Secondary malignant neoplasm of unspecified part of nervous system: Secondary | ICD-10-CM | POA: Diagnosis not present

## 2022-03-20 DIAGNOSIS — D509 Iron deficiency anemia, unspecified: Secondary | ICD-10-CM | POA: Diagnosis not present

## 2022-03-20 DIAGNOSIS — D630 Anemia in neoplastic disease: Secondary | ICD-10-CM | POA: Diagnosis not present

## 2022-03-20 DIAGNOSIS — C61 Malignant neoplasm of prostate: Secondary | ICD-10-CM | POA: Diagnosis not present

## 2022-03-24 DIAGNOSIS — K219 Gastro-esophageal reflux disease without esophagitis: Secondary | ICD-10-CM | POA: Diagnosis not present

## 2022-03-24 DIAGNOSIS — I959 Hypotension, unspecified: Secondary | ICD-10-CM | POA: Diagnosis not present

## 2022-03-24 DIAGNOSIS — F419 Anxiety disorder, unspecified: Secondary | ICD-10-CM | POA: Diagnosis not present

## 2022-03-24 DIAGNOSIS — M4802 Spinal stenosis, cervical region: Secondary | ICD-10-CM | POA: Diagnosis not present

## 2022-03-24 DIAGNOSIS — G8254 Quadriplegia, C5-C7 incomplete: Secondary | ICD-10-CM | POA: Diagnosis not present

## 2022-03-24 DIAGNOSIS — M1990 Unspecified osteoarthritis, unspecified site: Secondary | ICD-10-CM | POA: Diagnosis not present

## 2022-03-24 DIAGNOSIS — C61 Malignant neoplasm of prostate: Secondary | ICD-10-CM | POA: Diagnosis not present

## 2022-03-24 DIAGNOSIS — D630 Anemia in neoplastic disease: Secondary | ICD-10-CM | POA: Diagnosis not present

## 2022-03-24 DIAGNOSIS — N319 Neuromuscular dysfunction of bladder, unspecified: Secondary | ICD-10-CM | POA: Diagnosis not present

## 2022-03-24 DIAGNOSIS — I6611 Occlusion and stenosis of right anterior cerebral artery: Secondary | ICD-10-CM | POA: Diagnosis not present

## 2022-03-24 DIAGNOSIS — N400 Enlarged prostate without lower urinary tract symptoms: Secondary | ICD-10-CM | POA: Diagnosis not present

## 2022-03-24 DIAGNOSIS — D509 Iron deficiency anemia, unspecified: Secondary | ICD-10-CM | POA: Diagnosis not present

## 2022-03-24 DIAGNOSIS — C7951 Secondary malignant neoplasm of bone: Secondary | ICD-10-CM | POA: Diagnosis not present

## 2022-03-24 DIAGNOSIS — E039 Hypothyroidism, unspecified: Secondary | ICD-10-CM | POA: Diagnosis not present

## 2022-03-24 DIAGNOSIS — I10 Essential (primary) hypertension: Secondary | ICD-10-CM | POA: Diagnosis not present

## 2022-03-24 DIAGNOSIS — C7940 Secondary malignant neoplasm of unspecified part of nervous system: Secondary | ICD-10-CM | POA: Diagnosis not present

## 2022-03-25 ENCOUNTER — Ambulatory Visit: Payer: BC Managed Care – PPO | Admitting: Medical

## 2022-03-26 ENCOUNTER — Other Ambulatory Visit: Payer: Self-pay | Admitting: Internal Medicine

## 2022-03-27 ENCOUNTER — Encounter: Payer: Self-pay | Admitting: Physical Medicine and Rehabilitation

## 2022-03-27 ENCOUNTER — Encounter: Payer: Self-pay | Admitting: Oncology

## 2022-03-27 ENCOUNTER — Other Ambulatory Visit (HOSPITAL_COMMUNITY): Payer: Self-pay

## 2022-03-28 ENCOUNTER — Encounter
Payer: Managed Care, Other (non HMO) | Attending: Physical Medicine and Rehabilitation | Admitting: Physical Medicine and Rehabilitation

## 2022-03-28 ENCOUNTER — Encounter: Payer: Self-pay | Admitting: Internal Medicine

## 2022-03-28 ENCOUNTER — Encounter: Payer: Self-pay | Admitting: Physical Medicine and Rehabilitation

## 2022-03-28 VITALS — BP 160/95 | HR 82 | Temp 97.4°F | Ht 69.0 in | Wt 179.0 lb

## 2022-03-28 DIAGNOSIS — G8250 Quadriplegia, unspecified: Secondary | ICD-10-CM

## 2022-03-28 DIAGNOSIS — K592 Neurogenic bowel, not elsewhere classified: Secondary | ICD-10-CM | POA: Diagnosis not present

## 2022-03-28 DIAGNOSIS — C61 Malignant neoplasm of prostate: Secondary | ICD-10-CM

## 2022-03-28 DIAGNOSIS — I63521 Cerebral infarction due to unspecified occlusion or stenosis of right anterior cerebral artery: Secondary | ICD-10-CM

## 2022-03-28 DIAGNOSIS — N319 Neuromuscular dysfunction of bladder, unspecified: Secondary | ICD-10-CM

## 2022-03-28 NOTE — Progress Notes (Signed)
Subjective:    Patient ID: Jason [name], male    DOB: [birth year], 67 y.o.   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    Spasms still there- about the same- not painful still unles bangs knee on table.    Bladder- getting catheter 03/17/22- was at ED for UTI-  Fever; tired, but didn't feel sick Also had UTI end of November 2023- fever as well 101.2- given guidelines when to go to ED. 101.3 to go to ED.   Seeing Urology- at [facility]- Dr Nickolas Madrid- looked at Urodynamics study, but will wait for now. Sees in April 2024.   Bowel-  Primary changed to Senna-S- from [facility] Have changed time of bowel program- to 2pm -Senna then and 8pm or 7:30-8pm- usually doesn't have BM to wake him up- but has when wakes him up.  Eats dinner ~ 6:30pm.   New PCP comes out to the home.   Having Hematuria the last 2 days- likely due to stat-lock on R leg instead of L- felt like caught stat lock on R side.   Hypotension- off Midodrine- is high on automatic machines  Therapy- Still getting H/H therapy and nurse- PT and OT- changing insurances- retiring 04/06/22- ST disability will end- found a therapist, will come out regardless of changing insurances.     Xtandi- increased BP and brain fog- and fatigue.    Wants to walk "right now"  runs out of patience easily.   In permanent w/c- Stall's w/c- went great   Pain Inventory Average Pain 2 Pain Right Now 0 My pain is intermittent, burning, dull, and tingling  LOCATION OF PAIN  Shoulder  BOWEL Number of stools per week: 7 Oral laxative use Yes  Type of laxative Senna Enema or suppository use Yes   Incontinent Yes   BLADDER Foley  Bladder incontinence Yes  F  Mobility ability to climb steps?   no do you drive?  no use a wheelchair needs help with transfers Do you have any goals in this area?  yes  Function disabled: date disabled 10/06/2021 I need assistance with the following:  dressing, bathing, and toileting  Neuro/Psych spasms  Prior Studies Any changes since last visit?  no  Physicians involved in your care Any changes since last visit?  no   Family History  Problem Relation Age of Onset   Hypertension Mother    Stroke Mother    Gout Father    Heart attack Father 49   Heart disease Sister    Lung cancer Sister    Drug abuse Sister    Breast cancer Paternal Aunt    Lung cancer Maternal Grandmother    Colon cancer Neg Hx    Esophageal cancer Neg Hx    Rectal cancer Neg Hx    Stomach cancer Neg Hx    Social History   Socioeconomic History   Marital status: Married    Spouse name: [name]    Number of children: 0   Years of education: Not on file   Highest education level: Not on file  Occupational History   Occupation: Therapist, occupational: [employer]  Tobacco Use   Smoking status: Never    Passive exposure: Never   Smokeless tobacco:  Never  Vaping Use   Vaping Use: Never used  Substance and Sexual Activity   Alcohol use: Not Currently    Comment: Alcohol once every few weeks/months   Drug use: No   Sexual activity: Yes  Other Topics Concern   Not on file  Social History Narrative   Lives at home with spouse.    Social Determinants of Health   Financial Resource Strain: Not on file  Food Insecurity: Not on file  Transportation Needs: Not on file  Physical Activity: Not on file  Stress: Not on file  Social Connections: Not on file   Past Surgical History:  Procedure Laterality Date   CARDIAC CATHETERIZATION  03/2000   HERNIA REPAIR  5284   umbilical   LEFT HEART CATH AND CORONARY ANGIOGRAPHY Left 03/20/2020   Procedure: LEFT HEART CATH AND CORONARY ANGIOGRAPHY;  Surgeon: Nelva Bush, MD;  Location: [facility];  Service: Cardiovascular;  Laterality: Left;   POSTERIOR CERVICAL FUSION/FORAMINOTOMY N/A 10/06/2021   Procedure: POSTERIOR CERVICAL FUSION/ FORAMINOTOMY LEVEL 3;  Surgeon: Meade Maw, MD;  Location: [facility];  Service: Neurosurgery;  Laterality: N/A;  will need monitoring   RADIOLOGY WITH ANESTHESIA Left 04/18/2021   Procedure: MRI SHOULDER WITHOUT CONTRAST WITH ANESTHESIA;  Surgeon: Radiologist, Medication, MD;  Location: [facility];  Service: Radiology;  Laterality: Left;   RADIOLOGY WITH ANESTHESIA N/A 10/03/2021   Procedure: MRI CERVICAL SPINE WITH ANESTHESIA;  Surgeon: Radiologist, Medication, MD;  Location: [facility];  Service: Radiology;  Laterality: N/A;   RADIOLOGY WITH ANESTHESIA N/A 01/30/2022   Procedure: MRI CERVICAL SPINE WITH AND WITOHOUT CONTRAST WITH ANESTHESIA;  Surgeon: Radiologist, Medication, MD;  Location: [facility];  Service: Radiology;  Laterality: N/A;   REVERSE SHOULDER ARTHROPLASTY Left    ROTATOR CUFF REPAIR  11/2010   Dr [name] Heck   TONSILLECTOMY     TOTAL HIP ARTHROPLASTY Right 02/24/2009   TOTAL HIP ARTHROPLASTY Left 06/2016   Dr Harlow Mares   Past Medical History:  Diagnosis Date   Allergy    Arthritis    osteoarthritis   GERD (gastroesophageal reflux disease)    History of kidney stones    Hypertension    Hypothyroidism    Thyroid disease    Hyperthyroidism s/p radioactive iodine ablation   BP (!) 160/95   Pulse 82   Temp (!) 97.4 F (36.3 C)   Ht '5\' 9"'$  (1.753 m)   Wt 179 lb (81.2 kg)   SpO2 98%   BMI 26.43 kg/m   Opioid Risk Score:   Fall Risk Score:  `1  Depression screen PHQ 2/9     12/02/2021    1:20 PM 10/15/2020    3:41 PM 10/10/2019    3:10 PM 10/05/2018    7:54 AM 08/28/2017    8:28 AM  Depression screen PHQ 2/9  Decreased Interest 0 0 0 0 0  Down, Depressed, Hopeless 1 0 0 0 0  PHQ - 2 Score 1 0 0 0 0  Altered sleeping 0      Tired, decreased energy 1      Change in appetite 0      Feeling bad or failure about yourself  0       Trouble concentrating 0      Moving slowly or fidgety/restless 0      Suicidal thoughts 0      PHQ-9 Score 2          Review of Systems  Musculoskeletal:  Left shoulder pain   All other systems reviewed and are negative.     Objective:   Physical Exam Awake, alert, appropriate, tearful at times; accompanied by wife, in power w/c. 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   Needs to do bowel program 3 sets of dig stim- and suppository-   Bowel program for SCI patients (BP)  Use Dulcolax suppositories Start with doing bowel program within 1 hour after a meal- breakfast or dinner Take bowels meds -Po/oral at opposite time doing bowel program- if BP (bowel program) in evening, give PO meds in AM; and vice versa. Do both doses of Senokot-S at [employer] with digital stimulation- up to 2nd knuckle- stimulate rectum x 30 seconds Place suppository- wait 10 minutes Do Dig stim/Digital stimulation) every 10-15 minutes until empty If gets severely constipated, >3 days, can use Magnesium Citrate and follow in 4 hours with suppository or any type of enema.  Takes 6 weeks or so to get bowel to have a habit.   11.  Spasticity IS BETTER- hopefully will continue to improve.   12. Con't Foley- per urology  13.  Will place stat lock for pt- done-   14. F/U in 3 months- double appt- SCI    I spent a total of   41 minutes on total care today- >50% coordination of care- due to  d/w pt/wife- and neurogenic bowel and bladder- and spasticity- and SCI support group- 6-7 pm last Thursday of Month and [address]- [id number]-

## 2022-03-28 NOTE — Patient Instructions (Addendum)
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   Needs to do bowel program 3 sets of dig stim- and suppository-   Bowel program for SCI patients (BP)  Use Dulcolax suppositories Start with doing bowel program within 1 hour after a meal- breakfast or dinner Take bowels meds -Po/oral at opposite time doing bowel program- if BP (bowel program) in evening, give PO meds in AM; and vice versa. Do both doses of Senokot-S at [employer] with digital stimulation- up to 2nd knuckle- stimulate rectum x 30 seconds Place suppository- wait 10 minutes Do Dig stim/Digital stimulation) every 10-15 minutes until empty If gets severely constipated, >3 days, can use Magnesium Citrate and follow in 4 hours with suppository or any type of enema.  Takes 6 weeks or so to get bowel to have a habit.   11.  Spasticity IS BETTER- hopefully will continue to improve.   12. Con't Foley- per urology  13. SCI support group- 6-7 pm last Thursday of Month and [address]- [address]-   14.  Will place stat lock for pt- done-   15. F/U in 3 months- double appt- SCI

## 2022-03-31 ENCOUNTER — Encounter: Payer: Self-pay | Admitting: Internal Medicine

## 2022-03-31 ENCOUNTER — Other Ambulatory Visit (HOSPITAL_COMMUNITY): Payer: Self-pay

## 2022-03-31 NOTE — Telephone Encounter (Signed)
error 

## 2022-04-03 ENCOUNTER — Ambulatory Visit: Payer: BC Managed Care – PPO | Admitting: Internal Medicine

## 2022-04-03 ENCOUNTER — Other Ambulatory Visit (HOSPITAL_COMMUNITY): Payer: Self-pay

## 2022-04-07 ENCOUNTER — Other Ambulatory Visit (HOSPITAL_COMMUNITY): Payer: Self-pay

## 2022-04-08 ENCOUNTER — Other Ambulatory Visit (HOSPITAL_COMMUNITY): Payer: Self-pay

## 2022-04-08 ENCOUNTER — Telehealth: Payer: Self-pay | Admitting: *Deleted

## 2022-04-08 NOTE — Telephone Encounter (Signed)
Disability Form received and completed, sent for doctor signature

## 2022-04-08 NOTE — Telephone Encounter (Signed)
Form completed and copied for chart. My Chart message sent to let patient know it is ready for pick up

## 2022-04-10 ENCOUNTER — Telehealth: Payer: Self-pay

## 2022-04-10 ENCOUNTER — Other Ambulatory Visit: Payer: Self-pay | Admitting: Pharmacist

## 2022-04-10 ENCOUNTER — Encounter: Payer: Self-pay | Admitting: Oncology

## 2022-04-10 ENCOUNTER — Other Ambulatory Visit (HOSPITAL_COMMUNITY): Payer: Self-pay

## 2022-04-10 DIAGNOSIS — C61 Malignant neoplasm of prostate: Secondary | ICD-10-CM

## 2022-04-10 MED ORDER — ENZALUTAMIDE 40 MG PO TABS: 160.0000 mg | ORAL_TABLET | Freq: Every day | ORAL | 1 refills | Status: DC

## 2022-04-10 NOTE — Telephone Encounter (Signed)
Oral Oncology Patient Advocate Encounter   Received notification that prior authorization for [facility] is required through patient's new insurance ([employer]).   PA submitted on 04/10/22  Key [id number]  Status is pending     [name], [place] Patient [place]  [phone] (phone) [phone] (fax) 04/10/2022 10:18 AM

## 2022-04-10 NOTE — Progress Notes (Signed)
Patient had change in insurance and is now required to fill at [facility]. Xtandi Rx sent to [employer]

## 2022-04-10 NOTE — Telephone Encounter (Signed)
Oral Oncology Patient Advocate Encounter  Prior Authorization for [facility] has been approved.    PA# [id number]  Effective dates: 04/10/22 through 04/11/23  Patient must fill through [place]. [employer] phone number [phone].    [name], [facility] Patient [place]  [phone] (phone) [phone] (fax) 04/10/2022 11:21 AM

## 2022-04-11 ENCOUNTER — Other Ambulatory Visit: Payer: Self-pay

## 2022-04-11 MED ORDER — CLONAZEPAM 0.5 MG PO TABS: 0.5000 mg | ORAL_TABLET | Freq: Two times a day (BID) | ORAL | 0 refills | Status: DC | PRN

## 2022-04-11 MED ORDER — LEVOTHYROXINE SODIUM 88 MCG PO TABS: 88.0000 ug | ORAL_TABLET | Freq: Every day | ORAL | 3 refills | Status: DC

## 2022-04-11 MED ORDER — FLUOXETINE HCL 20 MG PO CAPS: 20.0000 mg | ORAL_CAPSULE | Freq: Every day | ORAL | 3 refills | Status: DC

## 2022-04-11 MED ORDER — ATORVASTATIN CALCIUM 40 MG PO TABS: 40.0000 mg | ORAL_TABLET | Freq: Every day | ORAL | 3 refills | Status: DC

## 2022-04-11 MED ORDER — TAMSULOSIN HCL 0.4 MG PO CAPS: 0.4000 mg | ORAL_CAPSULE | Freq: Every day | ORAL | 3 refills | Status: DC

## 2022-04-11 MED ORDER — POTASSIUM CHLORIDE CRYS ER 10 MEQ PO TBCR: 10.0000 meq | EXTENDED_RELEASE_TABLET | Freq: Every day | ORAL | 3 refills | Status: DC

## 2022-04-11 MED ORDER — FLUTICASONE PROPIONATE 50 MCG/ACT NA SUSP: 1.0000 | Freq: Every day | NASAL | 3 refills | Status: AC

## 2022-04-11 NOTE — Telephone Encounter (Signed)
Pt's wife was in for an [place] today and asked that his medications be sent to [facility]. All meds have been sent except for clonazepam.  Please send rx to [facility].

## 2022-04-14 ENCOUNTER — Encounter: Payer: Self-pay | Admitting: Medical

## 2022-04-14 ENCOUNTER — Ambulatory Visit: Payer: BC Managed Care – PPO | Attending: Internal Medicine | Admitting: Medical

## 2022-04-14 VITALS — BP 170/95 | HR 67 | Ht 69.0 in | Wt 179.0 lb

## 2022-04-14 DIAGNOSIS — I471 Supraventricular tachycardia, unspecified: Secondary | ICD-10-CM | POA: Diagnosis not present

## 2022-04-14 DIAGNOSIS — I4729 Other ventricular tachycardia: Secondary | ICD-10-CM

## 2022-04-14 DIAGNOSIS — Z79899 Other long term (current) drug therapy: Secondary | ICD-10-CM

## 2022-04-14 DIAGNOSIS — Z86718 Personal history of other venous thrombosis and embolism: Secondary | ICD-10-CM

## 2022-04-14 DIAGNOSIS — I10 Essential (primary) hypertension: Secondary | ICD-10-CM

## 2022-04-14 DIAGNOSIS — Z8673 Personal history of transient ischemic attack (TIA), and cerebral infarction without residual deficits: Secondary | ICD-10-CM

## 2022-04-14 DIAGNOSIS — C61 Malignant neoplasm of prostate: Secondary | ICD-10-CM | POA: Diagnosis not present

## 2022-04-14 DIAGNOSIS — E785 Hyperlipidemia, unspecified: Secondary | ICD-10-CM | POA: Diagnosis not present

## 2022-04-14 DIAGNOSIS — C7940 Secondary malignant neoplasm of unspecified part of nervous system: Secondary | ICD-10-CM

## 2022-04-14 NOTE — Progress Notes (Unsigned)
Cardiology Office Note:    Date:  04/14/2022   ID:  Jason [name], DOB 03-Oct-1955, MRN [id number]  PCP:  Venia Carbon, MD  [facility] Cardiologist:  Nelva Bush, MD  [facility] Electrophysiologist:  None   Referring MD: Venia Carbon, MD   Chief Complaint: 6 month follow-up  History of Present Illness:    Jason [name] is a 67 y.o. male with a hx of HTN, thyroid disease, GERD, osteoarthritis who presents for 6 month follow-up.   The patient underwent remote stress test in 2002 which led to cardiac cath that showed no significant CAD.    The patient was evaluated for palpitations in 2022. He eventually underwent cardiac cath 02/2020 that showed no significant CAD, normal LV function with mildly elevated filling pressures consistent with diastolic dysfunction. He was started on metorpolol for PVCs.    Seen in the office 03/30/20 reporting palpitations. A heart monitor was ordered that showed SR, rare PVC/PACs, NSVT, PSVT.   Echo 06/2020 showed LVEF 50-55%, no WMA, mild LVH, G1DD, RVSF, normal RV size and function.  Last seen 07/2021 and was overall doing well form a cardiac perspective.   Today, the patient reports he was diagnosed with stage 4 metastatic prostate cancer. He had a tumor in his neck that was taken out. He also has tumor around his lumbar region. He has not been walking singe August. He does PT. He also had a stroke, no residual changes or weakness. Chemo pills can make his brain foggy. He denies chest pain and shortness of breath. He has occasional Lower leg edema. No orthopnea or pnd.  BP is high today. He has potassium due to low K3.0.  Past Medical History:  Diagnosis Date   Allergy    Arthritis    osteoarthritis   GERD (gastroesophageal reflux disease)    History of kidney stones    Hypertension    Hypothyroidism    Thyroid disease    Hyperthyroidism s/p radioactive iodine ablation    Past Surgical History:  Procedure Laterality Date    CARDIAC CATHETERIZATION  03/2000   HERNIA REPAIR  [id number]   umbilical   LEFT HEART CATH AND CORONARY ANGIOGRAPHY Left 03/20/2020   Procedure: LEFT HEART CATH AND CORONARY ANGIOGRAPHY;  Surgeon: Nelva Bush, MD;  Location: [place] [facility];  Service: Cardiovascular;  Laterality: Left;   POSTERIOR CERVICAL FUSION/FORAMINOTOMY N/A 10/06/2021   Procedure: POSTERIOR CERVICAL FUSION/ FORAMINOTOMY LEVEL 3;  Surgeon: Meade Maw, MD;  Location: [facility];  Service: Neurosurgery;  Laterality: N/A;  will need monitoring   RADIOLOGY WITH ANESTHESIA Left 04/18/2021   Procedure: MRI SHOULDER WITHOUT CONTRAST WITH ANESTHESIA;  Surgeon: Radiologist, Medication, MD;  Location: [place];  Service: Radiology;  Laterality: Left;   RADIOLOGY WITH ANESTHESIA N/A 10/03/2021   Procedure: MRI CERVICAL SPINE WITH ANESTHESIA;  Surgeon: Radiologist, Medication, MD;  Location: [place];  Service: Radiology;  Laterality: N/A;   RADIOLOGY WITH ANESTHESIA N/A 01/30/2022   Procedure: MRI CERVICAL SPINE WITH AND WITOHOUT CONTRAST WITH ANESTHESIA;  Surgeon: Radiologist, Medication, MD;  Location: [place];  Service: Radiology;  Laterality: N/A;   REVERSE SHOULDER ARTHROPLASTY Left    ROTATOR CUFF REPAIR  11/2010   Dr [name] Heck   TONSILLECTOMY     TOTAL HIP ARTHROPLASTY Right 02/24/2009   TOTAL HIP ARTHROPLASTY Left 06/2016   Dr Harlow Mares    Current Medications: Current Meds  Medication Sig   acetaminophen (TYLENOL) 325 MG tablet Take 1-2 tablets (325-650 mg total)  by mouth every 4 (four) hours as needed for mild pain.   ascorbic acid (VITAMIN C) 500 MG tablet Take 1 tablet (500 mg total) by mouth daily.   aspirin EC 81 MG tablet Take 1 tablet (81 mg total) by mouth daily. Swallow whole.   atorvastatin (LIPITOR) 40 MG tablet Take 1 tablet (40 mg total) by mouth daily.   cetirizine (ZYRTEC) 10 MG tablet Take 10 mg by mouth at bedtime.   clonazePAM (KLONOPIN) 0.5 MG tablet Take 1 tablet (0.5 mg total) by mouth 2 (two) times  daily as needed for anxiety.   CRANBERRY PO Take 2 capsules by mouth 2 (two) times daily.   enzalutamide (XTANDI) 40 MG tablet Take 4 tablets (160 mg total) by mouth daily.   famotidine (PEPCID) 20 MG tablet Take 1 tablet (20 mg total) by mouth daily.   FLUoxetine (PROZAC) 20 MG capsule Take 1 capsule (20 mg total) by mouth at bedtime.   fluticasone (FLONASE) 50 MCG/ACT nasal spray Place 1 spray into both nostrils daily.   levothyroxine (SYNTHROID) 88 MCG tablet Take 1 tablet (88 mcg total) by mouth daily.   Multiple Vitamin (MULTIVITAMIN) tablet Take 1 tablet by mouth daily.   potassium chloride (KLOR-CON M) 10 MEQ tablet Take 1 tablet (10 mEq total) by mouth at bedtime.   sennosides-docusate sodium (SENOKOT-S) 8.6-50 MG tablet Take 2 tablets by mouth daily.   tamsulosin (FLOMAX) 0.4 MG CAPS capsule Take 1 capsule (0.4 mg total) by mouth daily after supper.     Allergies:   Patient has no known allergies.   Social History   Socioeconomic History   Marital status: Married    Spouse name: [name]    Number of children: 0   Years of education: Not on file   Highest education level: Not on file  Occupational History   Occupation: Therapist, occupational: [employer]  Tobacco Use   Smoking status: Never    Passive exposure: Never   Smokeless tobacco: Never  Vaping Use   Vaping Use: Never used  Substance and Sexual Activity   Alcohol use: Not Currently    Comment: Alcohol once every few weeks/months   Drug use: No   Sexual activity: Yes  Other Topics Concern   Not on file  Social History Narrative   Lives at home with spouse.    Social Determinants of Health   Financial Resource Strain: Not on file  Food Insecurity: Not on file  Transportation Needs: Not on file  Physical Activity: Not on file  Stress: Not on file  Social Connections: Not on file     Family History: The patient's family history includes Breast cancer in his paternal aunt; Drug abuse in his sister;  Gout in his father; Heart attack (age of onset: 69) in his father; Heart disease in his sister; Hypertension in his mother; Lung cancer in his maternal grandmother and sister; Stroke in his mother. There is no history of Colon cancer, Esophageal cancer, Rectal cancer, or Stomach cancer.  ROS:   Please see the history of present illness.     All other systems reviewed and are negative.  EKGs/Labs/Other Studies Reviewed:    The following studies were reviewed today:  Echo limited bubble study 09/2030 1. Left ventricular ejection fraction, by estimation, is 60 to 65%. The  left ventricle has normal function. The left ventricle has no regional  wall motion abnormalities. Left ventricular diastolic parameters are  indeterminate.   2. Right ventricular systolic  function is normal. The right ventricular  size is normal   3. Agitated saline contrast bubble study was negative, with no evidence  of any interatrial shun   4. Limited study   Echo 09/2021 1. Left ventricular ejection fraction, by estimation, is 55 to 60%. The  left ventricle has normal function. The left ventricle has no regional  wall motion abnormalities. Left ventricular diastolic parameters were  normal.   2. Right ventricular systolic function is normal. The right ventricular  size is normal.   3. The mitral valve is normal in structure. Mild mitral valve  regurgitation. No evidence of mitral stenosis.   4. The aortic valve is normal in structure. Aortic valve regurgitation is  not visualized. Mild aortic valve stenosis.   5. The inferior vena cava is normal in size with greater than 50%  respiratory variability, suggesting right atrial pressure of 3 mmHg.     EKG:  EKG is ordered today.  The ekg ordered today demonstrates NSR, 67bpm, nonspecific ST/T wave changes  Recent Labs: 01/20/2022: Magnesium 2.0 03/07/2022: ALT 24 03/16/2022: BUN 10; Creatinine, Ser 0.44; Hemoglobin 13.3; Platelets 253; Potassium 3.0; Sodium 135   Recent Lipid Panel    Component Value Date/Time   CHOL 115 10/17/2021 1232   CHOL 125 01/29/2021 0822   TRIG 117 10/08/2021 0355   HDL 49 01/29/2021 0822   CHOLHDL 2.6 01/29/2021 0822   CHOLHDL 3.9 07/25/2020 1657   VLDL 15 07/25/2020 1657   LDLCALC 62 01/29/2021 0822   LDLDIRECT 62 10/10/2021 0455   Physical Exam:    VS:  BP (!) 170/95 (BP Location: Right Arm, Patient Position: Sitting, Cuff Size: Normal)   Pulse 67   Ht 5' 9"$  (1.753 m)   Wt 179 lb (81.2 kg)   SpO2 99%   BMI 26.43 kg/m     Wt Readings from Last 3 Encounters:  04/14/22 179 lb (81.2 kg)  03/28/22 179 lb (81.2 kg)  03/19/22 179 lb (81.2 kg)     GEN:  Well nourished, well developed in no acute distress HEENT: Normal NECK: No JVD; No carotid bruits LYMPHATICS: No lymphadenopathy CARDIAC: RRR, no murmurs, rubs, gallops RESPIRATORY:  Clear to auscultation without rales, wheezing or rhonchi  ABDOMEN: Soft, non-tender, non-distended MUSCULOSKELETAL:  No edema; No deformity  SKIN: Warm and dry NEUROLOGIC:  Alert and oriented x 3 PSYCHIATRIC:  Normal affect   ASSESSMENT:    1. Essential hypertension, benign   2. 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 PSVT (paroxysmal supraventricular tachycardia)   4. NSVT (nonsustained ventricular tachycardia) ([place])   5. Hyperlipidemia, unspecified hyperlipidemia type   6. Medication management   7. History of deep vein thrombosis (DVT) of lower extremity   8. History of stroke    PLAN:    In order of problems listed above:  HTN BP is elevated today. It seems to be SBP 140s generally. He was on lisinopril, spironolactone and metoprolol. These have all been stopped, possible due to ?interaction with the chemo drug. Also may have been stopped for hypotension during hospitalization. Will have to discuss with Dr. Saunders Revel and oncology to see what is recommended. Suspect he will need at least one antihypertensive, especially with h/o stroke. PCP can  also address BP as he is seeing patient tomorrow.  Metastatic prostate cancer with mets to CNS system This was diagnosed in August 2023, he is currently taking Xtandi. He follows closely with oncology.   pSVT Patient denies palpitations. He  is off metoprolol as above.   HLD LDL 62. Continue Lipitor 51m daily.   H/o DVT Eliquis was stopped by oncology as it interacts with chemotherapy drug. He is taking Aspirin 841mdaily.   H/o stroke Patient had a small parietal stroke in 09/2021 during admission. No residual effects. Continue ASA and Lipitor.   Hypokalemia K3.0 on labs 1 month by oncology. He is taking Klor-con 1029mdaily. Oncology will given further instructions on labs.   Disposition: Follow up in 6 month(s) with MD/APP     Signed, Amandalynn Pitz H FNinfa MeekerA-C  04/14/2022 4:49 PM    [facility]

## 2022-04-14 NOTE — Patient Instructions (Signed)
Medication Instructions:   Your physician recommends that you continue on your current medications as directed. Please refer to the Current Medication list given to you today.  *If you need a refill on your cardiac medications before your next appointment, please call your pharmacy*   Lab Work:  NONE  If you have labs (blood work) drawn today and your tests are completely normal, you will receive your results only by: [place] (if you have MyChart) OR A paper copy in the mail If you have any lab test that is abnormal or we need to change your treatment, we will call you to review the results.   Testing/Procedures:  NONE   Follow-Up: At [facility], you and your health needs are our priority.  As part of our continuing mission to provide you with exceptional heart care, we have created designated Provider Care Teams.  These Care Teams include your primary Cardiologist (physician) and Advanced Practice Providers (APPs -  Physician Assistants and Nurse Practitioners) who all work together to provide you with the care you need, when you need it.  We recommend signing up for the patient portal called "MyChart".  Sign up information is provided on this After Visit Summary.  MyChart is used to connect with patients for Virtual Visits (Telemedicine).  Patients are able to view lab/test results, encounter notes, upcoming appointments, etc.  Non-urgent messages can be sent to your provider as well.   To learn more about what you can do with MyChart, go to [email or web address].    Your next appointment:   6 month(s)  Provider:   You may see Nelva Bush, MD or one of the following Advanced Practice Providers on your designated Care Team:   Murray Hodgkins, NP Christell Faith, PA-C Cadence Kathlen Mody, PA-C Gerrie Nordmann, NP

## 2022-04-15 ENCOUNTER — Encounter: Payer: Self-pay | Admitting: Internal Medicine

## 2022-04-15 ENCOUNTER — Ambulatory Visit: Payer: BC Managed Care – PPO | Admitting: Internal Medicine

## 2022-04-15 VITALS — BP 126/90 | HR 66 | Resp 16

## 2022-04-15 DIAGNOSIS — N319 Neuromuscular dysfunction of bladder, unspecified: Secondary | ICD-10-CM | POA: Diagnosis not present

## 2022-04-15 DIAGNOSIS — G8250 Quadriplegia, unspecified: Secondary | ICD-10-CM

## 2022-04-15 DIAGNOSIS — I10 Essential (primary) hypertension: Secondary | ICD-10-CM | POA: Diagnosis not present

## 2022-04-15 DIAGNOSIS — C61 Malignant neoplasm of prostate: Secondary | ICD-10-CM | POA: Diagnosis not present

## 2022-04-15 DIAGNOSIS — K219 Gastro-esophageal reflux disease without esophagitis: Secondary | ICD-10-CM

## 2022-04-15 DIAGNOSIS — C7940 Secondary malignant neoplasm of unspecified part of nervous system: Secondary | ICD-10-CM

## 2022-04-15 NOTE — Assessment & Plan Note (Signed)
PSA markedly down with lupron, xtandi Rx

## 2022-04-15 NOTE — Assessment & Plan Note (Signed)
Okay on famotidine 100m at bedtime

## 2022-04-15 NOTE — Assessment & Plan Note (Signed)
BP Readings from Last 3 Encounters:  04/15/22 (!) 126/90  04/14/22 (!) 170/95  03/28/22 (!) 160/95    Cardiology consulting with [name] about best options Probably would be okay to restart lisinopril 31m daily

## 2022-04-15 NOTE — Assessment & Plan Note (Signed)
Slow improvement Hopefully restarting with therapy (PT, OT) soon Still needs hoyer for transfers, bowel regimen, Foley

## 2022-04-15 NOTE — Assessment & Plan Note (Signed)
Still with foley On tamsulosin now Will consider voiding trial--but prognosis guarded at best May consider suprapubic

## 2022-04-15 NOTE — Progress Notes (Signed)
Subjective:    Patient ID: Jason [name], male    DOB: May 15, 1955, 67 y.o.   MRN: [id number]  HPI Home visit for follow up of quadraplegia and prostate cancer Wife here as usual  Had cardiology evaluation yesterday BP up some at that visit Had been hypotensive and even on midodrine after stroke, etc My visits have not shown elevated BP  PT and OT have been on hold Order under new insurance wasn't sent to the proper company Now the orders at at [facility] PSA down to 6.1 last month Now on xtandi for the prostate cancer Leupron every 4-6 months  Still has Foley in On tamsulsin  Will go back to urology in April--may consider voiding trial Considering suprapubic catheter if that fails  He feels his legs getting stronger--left stronger than right Using hand weights and arms also improving He does help some with transfers--pushes with feet Still uses [name] for transfers--wife still prefers to have someone here when she moves him 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     ascorbic acid (VITAMIN C) 500 MG tablet Take 1 tablet (500 mg total) by mouth daily. 30 tablet 0   aspirin EC 81 MG tablet Take 1 tablet (81 mg total) by mouth daily. Swallow whole. 30 tablet 12   atorvastatin (LIPITOR) 40 MG tablet Take 1 tablet (40 mg total) by mouth daily. 90 tablet 3   cetirizine (ZYRTEC) 10 MG tablet Take 10 mg by mouth at bedtime.     clonazePAM (KLONOPIN) 0.5 MG tablet Take 1 tablet (0.5 mg total) by mouth 2 (two) times daily as needed for anxiety. 180 tablet 0   CRANBERRY PO Take 2 capsules by mouth 2 (two) times daily.     enzalutamide (XTANDI) 40 MG tablet Take 4 tablets  (160 mg total) by mouth daily. 120 tablet 1   famotidine (PEPCID) 20 MG tablet Take 1 tablet (20 mg total) by mouth daily. 30 tablet 0   FLUoxetine (PROZAC) 20 MG capsule Take 1 capsule (20 mg total) by mouth at bedtime. 90 capsule 3   fluticasone (FLONASE) 50 MCG/ACT nasal spray Place 1 spray into both nostrils daily. 48 g 3   levothyroxine (SYNTHROID) 88 MCG tablet Take 1 tablet (88 mcg total) by mouth daily. 90 tablet 3   Multiple Vitamin (MULTIVITAMIN) tablet Take 1 tablet by mouth daily.     potassium chloride (KLOR-CON M) 10 MEQ tablet Take 1 tablet (10 mEq total) by mouth at bedtime. 90 tablet 3   sennosides-docusate sodium (SENOKOT-S) 8.6-50 MG tablet Take 2 tablets by mouth daily. 60 tablet 11   tamsulosin (FLOMAX) 0.4 MG CAPS capsule Take 1 capsule (0.4 mg total) by mouth daily after supper. 90 capsule 3   No current facility-administered medications on file prior to visit.    No Known Allergies  Past Medical History:  Diagnosis Date   Allergy    Arthritis    osteoarthritis   GERD (gastroesophageal reflux disease)    History of kidney stones    Hypertension    Hypothyroidism    Thyroid disease    Hyperthyroidism s/p radioactive iodine ablation    Past Surgical History:  Procedure Laterality Date   CARDIAC CATHETERIZATION  03/2000   HERNIA REPAIR  [id number]   umbilical   LEFT HEART CATH AND CORONARY ANGIOGRAPHY Left 03/20/2020   Procedure: LEFT HEART CATH AND CORONARY ANGIOGRAPHY;  Surgeon: Nelva Bush, MD;  Location: [place] [facility];  Service: Cardiovascular;  Laterality: Left;   POSTERIOR CERVICAL FUSION/FORAMINOTOMY N/A 10/06/2021   Procedure: POSTERIOR CERVICAL FUSION/ FORAMINOTOMY LEVEL 3;  Surgeon: Meade Maw, MD;  Location: [facility];  Service: Neurosurgery;  Laterality: N/A;  will need monitoring   RADIOLOGY WITH ANESTHESIA Left 04/18/2021   Procedure: MRI SHOULDER WITHOUT CONTRAST WITH ANESTHESIA;  Surgeon: Radiologist, Medication, MD;  Location: [place];  Service: Radiology;  Laterality: Left;   RADIOLOGY WITH ANESTHESIA N/A 10/03/2021   Procedure: MRI CERVICAL SPINE WITH ANESTHESIA;  Surgeon: Radiologist, Medication, MD;  Location: [place];  Service: Radiology;  Laterality: N/A;   RADIOLOGY WITH ANESTHESIA N/A 01/30/2022   Procedure: MRI CERVICAL SPINE WITH AND WITOHOUT CONTRAST WITH ANESTHESIA;  Surgeon: Radiologist, Medication, MD;  Location: [place];  Service: Radiology;  Laterality: N/A;   REVERSE SHOULDER ARTHROPLASTY Left    ROTATOR CUFF REPAIR  11/2010   Dr [name] Heck   TONSILLECTOMY     TOTAL HIP ARTHROPLASTY Right 02/24/2009   TOTAL HIP ARTHROPLASTY Left 06/2016   Dr Harlow Mares    Family History  Problem Relation Age of Onset   Hypertension Mother    Stroke Mother    Gout Father    Heart attack Father 63   Heart disease Sister    Lung cancer Sister    Drug abuse Sister    Breast cancer Paternal Aunt    Lung cancer Maternal Grandmother    Colon cancer Neg Hx    Esophageal cancer Neg Hx    Rectal cancer Neg Hx    Stomach cancer Neg Hx     Social History   Socioeconomic History   Marital status: Married    Spouse name: [name]    Number of children: 0   Years of education: Not on file   Highest education level: Not on file  Occupational History   Occupation: Therapist, occupational: [employer]  Tobacco Use   Smoking status: Never    Passive exposure: Never   Smokeless tobacco: Never  Vaping Use   Vaping Use: Never used  Substance and Sexual Activity   Alcohol use: Not Currently    Comment: Alcohol once every few weeks/months   Drug use: No   Sexual activity: Yes  Other Topics Concern   Not on file  Social History Narrative   Lives at home with spouse.    Social Determinants of Health   Financial Resource Strain: Not on file  Food Insecurity: Not on file  Transportation Needs: Not on file  Physical Activity: Not on file  Stress: Not on file  Social Connections: Not on file  Intimate Partner Violence:  Not on file   Review of Systems Appetite is good Weight is stable now (apparently) Bowels moving okay--using senna daily. Stimulation three times and suppository is the regimen (when he gets back to bed). Smaller  volume lately.  No chest pain or SOB No dizziness in general (except when he leaned to far forward to help with the lift sling transfer)    Objective:   Physical Exam Constitutional:      Appearance: Normal appearance.  Cardiovascular:     Rate and Rhythm: Normal rate and regular rhythm.     Heart  sounds: No murmur heard.    No gallop.  Pulmonary:     Effort: Pulmonary effort is normal.     Breath sounds: Normal breath sounds. No wheezing or rales.  Abdominal:     Palpations: Abdomen is soft.     Tenderness: There is no abdominal tenderness.  Musculoskeletal:     Cervical back: Neck supple.     Right lower leg: No edema.     Left lower leg: No edema.  Lymphadenopathy:     Cervical: No cervical adenopathy.  Neurological:     Mental Status: He is alert.     Comments: Right arm-- 4/5 Left arm --2+-3/5  Right leg  2+/5 Left leg 2/5  Psychiatric:        Mood and Affect: Mood normal.        Behavior: Behavior normal.            Assessment & Plan:

## 2022-04-17 ENCOUNTER — Telehealth: Payer: Self-pay | Admitting: Internal Medicine

## 2022-04-17 ENCOUNTER — Telehealth: Payer: Self-pay

## 2022-04-17 DIAGNOSIS — Z79899 Other long term (current) drug therapy: Secondary | ICD-10-CM

## 2022-04-17 MED ORDER — LISINOPRIL 10 MG PO TABS: 10.0000 mg | ORAL_TABLET | Freq: Every day | ORAL | 3 refills | Status: DC

## 2022-04-17 NOTE — Telephone Encounter (Addendum)
Pt and wife made aware Lisinopril 10 mg sent to requested pharmacy Repeat BMP ordered    ----- Message from Nelva Bush, MD sent at 04/17/2022  2:14 PM EST ----- Lars Mage,  Could you let Mr. Noto know that I have reviewed the notes from his recent visit with Cadence and also discussed his hypertension and ongoing cancer treatment with Dr. Tasia Catchings and her pharmacy team.  There is no contraindication to gradually restarting his blood pressure regimen.  I suggest that we have him start taking lisinopril 10 mg daily, with a follow-up BMP in about 2 weeks.  Thanks.  Gerald Stabs

## 2022-04-17 NOTE — Telephone Encounter (Signed)
Spoke to [place] and gave verbal orders

## 2022-04-17 NOTE — Telephone Encounter (Signed)
Home Health verbal orders Caller Name: cheryl  Agency Name: [facility] number: [phone], secured   Requesting OT/PT/Skilled nursing/Social Work/Speech: skilled nursing   Reason:  Frequency: one week nine for catheter changing Every 4 weeks, educational hypertension   Please forward to [facility] pool or providers CMA

## 2022-04-22 ENCOUNTER — Other Ambulatory Visit: Payer: Self-pay

## 2022-04-22 ENCOUNTER — Encounter: Payer: Self-pay | Admitting: Oncology

## 2022-04-22 ENCOUNTER — Telehealth: Payer: Self-pay | Admitting: Internal Medicine

## 2022-04-22 ENCOUNTER — Telehealth: Payer: Self-pay | Admitting: *Deleted

## 2022-04-22 DIAGNOSIS — C61 Malignant neoplasm of prostate: Secondary | ICD-10-CM

## 2022-04-22 NOTE — Telephone Encounter (Signed)
Ok to add Dr. Darnelle Bos labs to labs here. Will add BMP to labs as that is the only order under Dr. Saunders Revel.

## 2022-04-22 NOTE — Telephone Encounter (Signed)
Patient wife called wanting to be sure that labs ordered for Friday are for what [name] and Dr Marisue Humble both want drawn since it is difficult for her to get patient to office. Please check and call her back to let her know that labs for both doctors are being drawn

## 2022-04-22 NOTE — Telephone Encounter (Signed)
Spoke with Benjamine Mola from Cancer center who reported pt will have BMP drawn on 3/1 for Dr. Saunders Revel to review.  Will forward to make aware.

## 2022-04-22 NOTE — Telephone Encounter (Signed)
Labs ordered. Confirmed with cardiology office that BMP was the only order needed from Dr. Saunders Revel. Left ph number in case other labs are needed.

## 2022-04-22 NOTE — Telephone Encounter (Signed)
Caller stated they will be drawing the patient's BMET panel at his next visit on 3/1.  Caller stated if additional blood work is needed can leave VM.

## 2022-04-23 ENCOUNTER — Telehealth: Payer: Self-pay | Admitting: Internal Medicine

## 2022-04-23 ENCOUNTER — Ambulatory Visit: Payer: BC Managed Care – PPO | Admitting: Oncology

## 2022-04-23 ENCOUNTER — Other Ambulatory Visit: Payer: BC Managed Care – PPO

## 2022-04-23 NOTE — Telephone Encounter (Signed)
Home Health verbal orders [place] Agency Name: [place] number: [phone]  Requesting OT/PT/Skilled nursing/Social Work/Speech:  Reason:OT  Frequency: 1 W 1 - 2 W 7  Please forward to [employer] or providers CMA

## 2022-04-24 NOTE — Telephone Encounter (Signed)
Verbal orders left on verified VM  

## 2022-04-25 ENCOUNTER — Inpatient Hospital Stay (HOSPITAL_BASED_OUTPATIENT_CLINIC_OR_DEPARTMENT_OTHER): Payer: Managed Care, Other (non HMO) | Admitting: Oncology

## 2022-04-25 ENCOUNTER — Inpatient Hospital Stay: Payer: Managed Care, Other (non HMO) | Attending: Anatomic Pathology & Clinical Pathology

## 2022-04-25 ENCOUNTER — Telehealth: Payer: Self-pay

## 2022-04-25 ENCOUNTER — Encounter: Payer: Self-pay | Admitting: Oncology

## 2022-04-25 VITALS — BP 133/81 | HR 69 | Temp 97.4°F | Resp 16

## 2022-04-25 DIAGNOSIS — K219 Gastro-esophageal reflux disease without esophagitis: Secondary | ICD-10-CM | POA: Insufficient documentation

## 2022-04-25 DIAGNOSIS — M48061 Spinal stenosis, lumbar region without neurogenic claudication: Secondary | ICD-10-CM | POA: Diagnosis not present

## 2022-04-25 DIAGNOSIS — Z801 Family history of malignant neoplasm of trachea, bronchus and lung: Secondary | ICD-10-CM | POA: Diagnosis not present

## 2022-04-25 DIAGNOSIS — Z86718 Personal history of other venous thrombosis and embolism: Secondary | ICD-10-CM

## 2022-04-25 DIAGNOSIS — C7940 Secondary malignant neoplasm of unspecified part of nervous system: Secondary | ICD-10-CM | POA: Insufficient documentation

## 2022-04-25 DIAGNOSIS — Z79890 Hormone replacement therapy (postmenopausal): Secondary | ICD-10-CM | POA: Diagnosis not present

## 2022-04-25 DIAGNOSIS — Z8349 Family history of other endocrine, nutritional and metabolic diseases: Secondary | ICD-10-CM | POA: Diagnosis not present

## 2022-04-25 DIAGNOSIS — C7951 Secondary malignant neoplasm of bone: Secondary | ICD-10-CM | POA: Diagnosis present

## 2022-04-25 DIAGNOSIS — Z79818 Long term (current) use of other agents affecting estrogen receptors and estrogen levels: Secondary | ICD-10-CM | POA: Diagnosis not present

## 2022-04-25 DIAGNOSIS — G8250 Quadriplegia, unspecified: Secondary | ICD-10-CM

## 2022-04-25 DIAGNOSIS — I10 Essential (primary) hypertension: Secondary | ICD-10-CM | POA: Insufficient documentation

## 2022-04-25 DIAGNOSIS — Z8249 Family history of ischemic heart disease and other diseases of the circulatory system: Secondary | ICD-10-CM | POA: Diagnosis not present

## 2022-04-25 DIAGNOSIS — M47816 Spondylosis without myelopathy or radiculopathy, lumbar region: Secondary | ICD-10-CM | POA: Insufficient documentation

## 2022-04-25 DIAGNOSIS — C61 Malignant neoplasm of prostate: Secondary | ICD-10-CM | POA: Diagnosis not present

## 2022-04-25 DIAGNOSIS — Z7189 Other specified counseling: Secondary | ICD-10-CM

## 2022-04-25 DIAGNOSIS — N319 Neuromuscular dysfunction of bladder, unspecified: Secondary | ICD-10-CM

## 2022-04-25 DIAGNOSIS — Z87442 Personal history of urinary calculi: Secondary | ICD-10-CM | POA: Diagnosis not present

## 2022-04-25 DIAGNOSIS — I700 Atherosclerosis of aorta: Secondary | ICD-10-CM | POA: Insufficient documentation

## 2022-04-25 DIAGNOSIS — Z823 Family history of stroke: Secondary | ICD-10-CM | POA: Diagnosis not present

## 2022-04-25 DIAGNOSIS — M4802 Spinal stenosis, cervical region: Secondary | ICD-10-CM | POA: Insufficient documentation

## 2022-04-25 DIAGNOSIS — Z814 Family history of other substance abuse and dependence: Secondary | ICD-10-CM | POA: Diagnosis not present

## 2022-04-25 DIAGNOSIS — R531 Weakness: Secondary | ICD-10-CM | POA: Diagnosis not present

## 2022-04-25 DIAGNOSIS — Z5111 Encounter for antineoplastic chemotherapy: Secondary | ICD-10-CM | POA: Diagnosis present

## 2022-04-25 DIAGNOSIS — R5383 Other fatigue: Secondary | ICD-10-CM | POA: Insufficient documentation

## 2022-04-25 DIAGNOSIS — Z79899 Other long term (current) drug therapy: Secondary | ICD-10-CM | POA: Diagnosis not present

## 2022-04-25 DIAGNOSIS — Z803 Family history of malignant neoplasm of breast: Secondary | ICD-10-CM | POA: Diagnosis not present

## 2022-04-25 DIAGNOSIS — IMO0001 Reserved for inherently not codable concepts without codable children: Secondary | ICD-10-CM

## 2022-04-25 LAB — CMP (CANCER CENTER ONLY)
ALT: 18 U/L (ref 0–44)
AST: 26 U/L (ref 15–41)
Albumin: 3.7 g/dL (ref 3.5–5.0)
Alkaline Phosphatase: 88 U/L (ref 38–126)
Anion gap: 9 (ref 5–15)
BUN: 9 mg/dL (ref 8–23)
CO2: 28 mmol/L (ref 22–32)
Calcium: 9.1 mg/dL (ref 8.9–10.3)
Chloride: 101 mmol/L (ref 98–111)
Creatinine: 0.46 mg/dL — ABNORMAL LOW (ref 0.61–1.24)
GFR, Estimated: >60 mL/min (ref >60)
Glucose, Bld: 93 mg/dL (ref 70–99)
Potassium: 3.6 mmol/L (ref 3.5–5.1)
Sodium: 138 mmol/L (ref 135–145)
Total Bilirubin: 0.7 mg/dL (ref 0.3–1.2)
Total Protein: 6.9 g/dL (ref 6.5–8.1)

## 2022-04-25 LAB — CBC WITH DIFFERENTIAL (CANCER CENTER ONLY)
Abs Immature Granulocytes: 0.01 10*3/uL (ref 0.00–0.07)
Basophils Absolute: 0 10*3/uL (ref 0.0–0.1)
Basophils Relative: 1 %
Eosinophils Absolute: 0.1 10*3/uL (ref 0.0–0.5)
Eosinophils Relative: 2 %
HCT: 43.4 % (ref 39.0–52.0)
Hemoglobin: 14.2 g/dL (ref 13.0–17.0)
Immature Granulocytes: 0 %
Lymphocytes Relative: 23 %
Lymphs Abs: 0.9 10*3/uL (ref 0.7–4.0)
MCH: 31.8 pg (ref 26.0–34.0)
MCHC: 32.7 g/dL (ref 30.0–36.0)
MCV: 97.1 fL (ref 80.0–100.0)
Monocytes Absolute: 0.3 10*3/uL (ref 0.1–1.0)
Monocytes Relative: 7 %
Neutro Abs: 2.7 10*3/uL (ref 1.7–7.7)
Neutrophils Relative %: 67 %
Platelet Count: 276 10*3/uL (ref 150–400)
RBC: 4.47 MIL/uL (ref 4.22–5.81)
RDW: 14.8 % (ref 11.5–15.5)
WBC Count: 4 10*3/uL (ref 4.0–10.5)
nRBC: 0 % (ref 0.0–0.2)

## 2022-04-25 LAB — BASIC METABOLIC PANEL - CANCER CENTER ONLY
Anion gap: 10 (ref 5–15)
BUN: 8 mg/dL (ref 8–23)
CO2: 28 mmol/L (ref 22–32)
Calcium: 9.1 mg/dL (ref 8.9–10.3)
Chloride: 101 mmol/L (ref 98–111)
Creatinine: 0.46 mg/dL — ABNORMAL LOW (ref 0.61–1.24)
GFR, Estimated: >60 mL/min (ref >60)
Glucose, Bld: 92 mg/dL (ref 70–99)
Potassium: 3.6 mmol/L (ref 3.5–5.1)
Sodium: 139 mmol/L (ref 135–145)

## 2022-04-25 LAB — PSA: Prostatic Specific Antigen: 4.68 ng/mL — ABNORMAL HIGH (ref 0.00–4.00)

## 2022-04-25 MED ORDER — ENZALUTAMIDE 40 MG PO TABS: 160.0000 mg | ORAL_TABLET | Freq: Every day | ORAL | 4 refills | Status: DC

## 2022-04-25 NOTE — Assessment & Plan Note (Signed)
On Eligard '45mg'$  Q6 months. Next due Mid June 2024

## 2022-04-25 NOTE — Progress Notes (Signed)
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 Currently on chemoprevention therapy with Eligard.  Status post palliative radiation to spine. PMSA PET scan showed partial response. Patient is not a candidate for chemotherapy with docetaxel. Labs are reviewed and discussed with patient.  He tolerates Xtandi 160 mg daily, continue current regimen.  Fatigue/brain fog could be due to CNS side effects. Manageable.  Skin breakdown of sacrum area could be secondary to pressure ulcer.  I was not able to visualize during this visit in the clinic room due to quadriplegia.  Recommend patient to have RN check the area during home nurse visit.  I discussed about pressure ulcer prevention measures.   Androgen deprivation therapy On Eligard '45mg'$  Q6 months. Next due Mid June 2024  History of deep vein thrombosis (DVT) of lower extremity Provoked, off Eliquis due to interaction with Xtandi Continue Aspirin 162 mg daily for prophylaxis.  Goals of care, counseling/discussion Discussed with patient.   Chronic incomplete quadriplegia ([place]) Follow up with neurology Dr.Vaslow, repeat MRI Continue physical therapy.  Neurogenic bladder Continue follow up with urology    Orders Placed This Encounter  Procedures   CBC with Differential ([place] Only)    Standing Status:   Future    Standing Expiration Date:   04/25/2023   CMP ([place] only)    Standing Status:   Future    Standing Expiration Date:    04/25/2023   PSA    Standing Status:   Future    Standing Expiration Date:   04/25/2023   Follow up 8 weeks   All questions were answered. The patient knows to call the clinic with any problems, questions or concerns.  Earlie Server, MD, PhD [facility] Hematology Oncology 04/25/2022        HISTORY OF PRESENTING ILLNESS:  Jason [name] 67 y.o. male presents to establish care for metastatic prostate cancer I have reviewed his chart and materials related to his cancer extensively and collaborated history with the patient. 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  Associated epidural disease greater at the C6 where there is marked canal stenosis without cord compression.  Left greater than right foraminal effacement due to the extraosseous disease.   10/03/2021 Imaging   MRI left shoulder showed High-riding humeral head with massive full-thickness tear of the entire supraspinatus tendon and the anterior 50% of the infraspinatus tendon. Additional partial-thickness articular sided tearing of the posterior infraspinatus. Moderate to high-grade supraspinatus and infraspinatus muscle atrophy suggests these tears  are chronic. Partial-thickness tearing of the superior greater than inferior  aspects of the subscapularis tendon footprint. Mild subscapularis muscle atrophy.  Moderate degenerative changes of the acromioclavicular joint. Moderate glenohumeral cartilage degenerative changes.   10/05/2021 Imaging   CT chest abdomen pelvis with contrast showed  Moderate severity retroperitoneal and pelvic lymphadenopathy,consistent with metastatic disease. Small lytic areas at  the levels of L4 and S1, with diffusely sclerotic changes involving the second and fourth  right ribs, sacrum and left iliac bone. 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  Sigmoid diverticulosis. Aortic atherosclerosis   10/05/2021 Imaging   CT cervical spine without contrast Extensive heterogeneous sclerotic lesions throughout the cervical spine compatible with metastatic disease to the bone.  Mixed sclerotic and lytic changes in the posterior elements at C5 and C6.Extraosseous tumor at C5 and C6 is better appreciated on the MRI scan.   10/05/2021 Tumor Marker   PSA >1500   10/06/2021 Cancer Staging   Staging form: Prostate, AJCC 8th Edition - Clinical stage from 10/06/2021: Stage IVB (cN1, pM1c, PSA: 1500) - Signed by Earlie Server, MD on 12/04/2021 Stage prefix: Initial diagnosis Prostate specific antigen (PSA) range: 20 or greater   10/08/2021 Imaging   MRI thoracic spine and the lumbar spine with and without contrast 1. 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 2. 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 3. Additional epidural tumor along the posterior endplates at 075-GRM, and T12 without high-grade spinal canal stenosis or cord compression. 4. Extensive epidural tumor in the lumbar spine is greatest in thickness at L1 without high-grade spinal canal stenosis at this level; however, superimposed on pre-existing degenerative changes lower in the lumbar spine results in severe spinal canal stenosis with cauda equina nerve root compression at L3-L4 and L4-L5. Multilevel neural foraminal stenosis is detailed above. 5. Postsurgical changes reflecting C6 laminectomy unchanged spinal canal stenosis  compared to the postoperative study from 1 day prior.No definite cord signal abnormality. 6. Extramedullary fluid collection along the dorsal aspect of the cord beginning at T9-T10 extending into the lumbar spine is likely subdural in location but is of uncertain etiology; evolving blood products not excluded. The collection anteriorly displaces the cord and cauda equina nerve roots without frank cord compression or signal abnormality. There is no peripheral enhancement to suggest abscess. 7. Prevertebral edema in the cervical spine which may be postsurgical in nature. 8. New bilateral lower lobe consolidations may reflect atelectasis or developing pneumonia/aspiration.   10/08/2021 Imaging   MRI brain with and without contrast showed 1. 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 There is intracranial extension of tumor along the right frontal convexity measuring up to 5 mm in thickness without mass effect on the underlying brain parenchyma or midline shift. Extracranial extension of tumor in this location measures up to approximately 3 mm. 2. Additional extracranial extension of tumor into the scalp along the vertex measuring up to 7 mm in thickness. 3. Small focus of diffusion restriction in the right parietal cortex most in keeping with a small acute infarct. 4. No evidence of parenchymal metastatic disease.   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 10/06/2021, patient underwent C6 cervical laminectomy and resection of tumor.  Pathology came back positive for metastatic prostate cancer. 8/14 developed significant bilateral upper and lower extremity weakness with hypotension requiring transfer to the ICU, pressors, steroid, respiratory failure on mechanical ventilation. Later patient was stabilized and extubated.  8/16 Firmagon loading dose  $'240mg'G$   9/15 Eligard 22.'5mg'$  given inpatient during his rehab admission.     10/2021 -  Radiation Therapy   Palliative radiation to spine.    12/04/2021 Tumor Marker   PSA 67.09   01/13/2022 Tumor Marker   PSA 43.95   01/23/2022 -  Chemotherapy   Started on Xtandi '160mg'$  daily.     INTERVAL HISTORY Jason [name] is a 67 y.o. male who has above history reviewed by me today presents for follow up visit for metastatic prostate cancer. Patient was accompanied by wife.  Patient has chronic bilateral lower extremity extremity weakness, generalized left upper extremity weakness.  He gets physical therapy.  He follows up with neurology. He takes [place] '160mg'$  daily.overall tolerates well, + fatigued, brain fog Wife mentioned a very small area of skin breakdown to sacrum area.    MEDICAL HISTORY:  Past Medical History:  Diagnosis Date   Allergy    Arthritis    osteoarthritis   GERD (gastroesophageal reflux disease)    History of kidney stones    Hypertension    Hypothyroidism    Thyroid disease    Hyperthyroidism s/p radioactive iodine ablation    SURGICAL HISTORY: Past Surgical History:  Procedure Laterality Date   CARDIAC CATHETERIZATION  03/2000   HERNIA REPAIR  [id number]   umbilical   LEFT HEART CATH AND CORONARY ANGIOGRAPHY Left 03/20/2020   Procedure: LEFT HEART CATH AND CORONARY ANGIOGRAPHY;  Surgeon: Nelva Bush, MD;  Location: [facility];  Service: Cardiovascular;  Laterality: Left;   POSTERIOR CERVICAL FUSION/FORAMINOTOMY N/A 10/06/2021   Procedure: POSTERIOR CERVICAL FUSION/ FORAMINOTOMY LEVEL 3;  Surgeon: Meade Maw, MD;  Location: [facility];  Service: Neurosurgery;  Laterality: N/A;  will need monitoring   RADIOLOGY WITH ANESTHESIA Left 04/18/2021   Procedure: MRI SHOULDER WITHOUT CONTRAST WITH ANESTHESIA;  Surgeon: Radiologist, Medication, MD;  Location: [facility];  Service: Radiology;  Laterality: Left;   RADIOLOGY WITH ANESTHESIA N/A 10/03/2021    Procedure: MRI CERVICAL SPINE WITH ANESTHESIA;  Surgeon: Radiologist, Medication, MD;  Location: [facility];  Service: Radiology;  Laterality: N/A;   RADIOLOGY WITH ANESTHESIA N/A 01/30/2022   Procedure: MRI CERVICAL SPINE WITH AND WITOHOUT CONTRAST WITH ANESTHESIA;  Surgeon: Radiologist, Medication, MD;  Location: [facility];  Service: Radiology;  Laterality: N/A;   REVERSE SHOULDER ARTHROPLASTY Left    ROTATOR CUFF REPAIR  11/2010   Dr [name] Heck   TONSILLECTOMY     TOTAL HIP ARTHROPLASTY Right 02/24/2009   TOTAL HIP ARTHROPLASTY Left 06/2016   Dr Harlow Mares    SOCIAL HISTORY: Social History   Socioeconomic History   Marital status: Married    Spouse name: [name]    Number of children: 0   Years of education: Not on file   Highest education level: Not on file  Occupational History   Occupation: Therapist, occupational: [employer]  Tobacco Use   Smoking status: Never    Passive exposure: Never   Smokeless tobacco: Never  Vaping Use   Vaping Use: Never used  Substance and Sexual Activity   Alcohol use: Not Currently    Comment: Alcohol once every few weeks/months   Drug use: No   Sexual activity: Yes  Other Topics Concern   Not on file  Social History Narrative   Lives at home with spouse.    Social Determinants of Health   Financial Resource Strain: Not on file  Food Insecurity: Not on file  Transportation Needs: Not on file  Physical Activity:  Not on file  Stress: Not on file  Social Connections: Not on file  Intimate Partner Violence: Not on file    FAMILY HISTORY: Family History  Problem Relation Age of Onset   Hypertension Mother    Stroke Mother    Gout Father    Heart attack Father 19   Heart disease Sister    Lung cancer Sister    Drug abuse Sister    Breast cancer Paternal Aunt    Lung cancer Maternal Grandmother    Colon cancer Neg Hx    Esophageal cancer Neg Hx    Rectal cancer Neg Hx    Stomach cancer Neg Hx     ALLERGIES:  has No Known  Allergies.  MEDICATIONS:  Current Outpatient Medications  Medication Sig Dispense Refill   acetaminophen (TYLENOL) 325 MG tablet Take 1-2 tablets (325-650 mg total) by mouth every 4 (four) hours as needed for mild pain.     ascorbic acid (VITAMIN C) 500 MG tablet Take 1 tablet (500 mg total) by mouth daily. 30 tablet 0   aspirin EC 81 MG tablet Take 1 tablet (81 mg total) by mouth daily. Swallow whole. 30 tablet 12   atorvastatin (LIPITOR) 40 MG tablet Take 1 tablet (40 mg total) by mouth daily. 90 tablet 3   cetirizine (ZYRTEC) 10 MG tablet Take 10 mg by mouth at bedtime.     clonazePAM (KLONOPIN) 0.5 MG tablet Take 1 tablet (0.5 mg total) by mouth 2 (two) times daily as needed for anxiety. 180 tablet 0   CRANBERRY PO Take 2 capsules by mouth 2 (two) times daily.     famotidine (PEPCID) 20 MG tablet Take 1 tablet (20 mg total) by mouth daily. 30 tablet 0   FLUoxetine (PROZAC) 20 MG capsule Take 1 capsule (20 mg total) by mouth at bedtime. 90 capsule 3   fluticasone (FLONASE) 50 MCG/ACT nasal spray Place 1 spray into both nostrils daily. 48 g 3   levothyroxine (SYNTHROID) 88 MCG tablet Take 1 tablet (88 mcg total) by mouth daily. 90 tablet 3   lisinopril (ZESTRIL) 10 MG tablet Take 1 tablet (10 mg total) by mouth daily. 90 tablet 3   Multiple Vitamin (MULTIVITAMIN) tablet Take 1 tablet by mouth daily.     potassium chloride (KLOR-CON M) 10 MEQ tablet Take 1 tablet (10 mEq total) by mouth at bedtime. 90 tablet 3   sennosides-docusate sodium (SENOKOT-S) 8.6-50 MG tablet Take 2 tablets by mouth daily. 60 tablet 11   tamsulosin (FLOMAX) 0.4 MG CAPS capsule Take 1 capsule (0.4 mg total) by mouth daily after supper. 90 capsule 3   enzalutamide (XTANDI) 40 MG tablet Take 4 tablets (160 mg total) by mouth daily. 60 tablet 4   No current facility-administered medications for this visit.    Review of Systems  Constitutional:  Positive for fatigue. Negative for appetite change and chills.  HENT:    Negative for hearing loss.   Eyes:  Negative for icterus.  Respiratory:  Negative for cough and shortness of breath.   Cardiovascular:  Negative for chest pain.  Gastrointestinal:  Negative for abdominal pain and blood in stool.  Genitourinary:         + Foley catheter  Musculoskeletal:  Negative for back pain.  Skin:  Negative for rash.  Neurological:  Positive for extremity weakness.     PHYSICAL EXAMINATION: ECOG PERFORMANCE STATUS: 2 - Symptomatic, <50% confined to bed  Vitals:   04/25/22 1023  BP: 133/81  Pulse: 69  Resp: 16  Temp: (!) 97.4 F (36.3 C)   Filed Weights      Physical Exam Constitutional:      General: He is not in acute distress.    Appearance: He is not diaphoretic.     Comments: Patient sits in wheelchair.  HENT:     Head: Normocephalic and atraumatic.     Nose: Nose normal.     Mouth/Throat:     Pharynx: No oropharyngeal exudate.  Eyes:     General: No scleral icterus. Cardiovascular:     Rate and Rhythm: Normal rate.  Pulmonary:     Effort: Pulmonary effort is normal. No respiratory distress.  Abdominal:     General: There is no distension.  Musculoskeletal:        General: Normal range of motion.     Cervical back: Normal range of motion.  Skin:    General: Skin is warm and dry.     Findings: No erythema.  Neurological:     Mental Status: He is alert and oriented to person, place, and time.     Cranial Nerves: No cranial nerve deficit.     Motor: No abnormal muscle tone.     Comments: Quadriplegia  Psychiatric:        Mood and Affect: Mood and affect normal.      LABORATORY DATA:  I have reviewed the data as listed    Latest Ref Rng & Units 04/25/2022    9:42 AM 03/16/2022   10:53 PM 03/07/2022    9:41 AM  CBC  WBC 4.0 - 10.5 K/uL 4.0  13.4  3.9   Hemoglobin 13.0 - 17.0 g/dL 14.2  13.3  14.1   Hematocrit 39.0 - 52.0 % 43.4  39.2  41.6   Platelets 150 - 400 K/uL 276  253  316       Latest Ref Rng & Units 04/25/2022     9:41 AM 03/16/2022   10:53 PM 03/07/2022    9:41 AM  CMP  Glucose 70 - 99 mg/dL 70 - 99 mg/dL 92    93  127  107   BUN 8 - 23 mg/dL 8 - 23 mg/dL '8    9  10  9   '$ Creatinine 0.61 - 1.24 mg/dL 0.61 - 1.24 mg/dL 0.46    0.46  0.44  0.50   Sodium 135 - 145 mmol/L 135 - 145 mmol/L 139    138  135  138   Potassium 3.5 - 5.1 mmol/L 3.5 - 5.1 mmol/L 3.6    3.6  3.0  3.4   Chloride 98 - 111 mmol/L 98 - 111 mmol/L 101    101  101  98   CO2 22 - 32 mmol/L 22 - 32 mmol/L '28    28  23  31   '$ Calcium 8.9 - 10.3 mg/dL 8.9 - 10.3 mg/dL 9.1    9.1  8.6  9.0   Total Protein 6.5 - 8.1 g/dL 6.9   6.9   Total Bilirubin 0.3 - 1.2 mg/dL 0.7   0.9   Alkaline Phos 38 - 126 U/L 88   101   AST 15 - 41 U/L 26   31   ALT 0 - 44 U/L 18   24      RADIOGRAPHIC STUDIES: I have personally reviewed the radiological images as listed and agreed with the findings in the report. No results found.

## 2022-04-25 NOTE — Assessment & Plan Note (Signed)
Discussed with patient

## 2022-04-25 NOTE — Progress Notes (Signed)
Pt in for follow up, reports he has been doing well. Denies any concerns.

## 2022-04-25 NOTE — Assessment & Plan Note (Addendum)
Provoked, off Eliquis due to interaction with Xtandi Continue Aspirin 162 mg daily for prophylaxis.

## 2022-04-25 NOTE — Telephone Encounter (Signed)
Rx sent as 15 days supply, per patient's request

## 2022-04-25 NOTE — Telephone Encounter (Signed)
Saw patient and his wife in office today. They requested we send a new Xtandi script to [facility] as a 15 day supply with 4 refills (1+4 for a total of 5 fills). [facility] is requiring them to fill a 15-day supply at a time for the first 6 fills and they have already received one. This would keep things easier to track for the patient until they are able to fill 30-day supplies after the first 3 months. Pharmacist notified of request.    [name], [place] [facility]  [phone] (phone) [phone] (fax) 04/25/2022 11:22 AM

## 2022-04-25 NOTE — Assessment & Plan Note (Addendum)
Follow up with neurology Dr.Vaslow, repeat MRI Continue physical therapy.

## 2022-04-25 NOTE — Assessment & Plan Note (Addendum)
Metastatic castration sensitive prostate cancer, with CNS involvement. Currently on chemoprevention therapy with Eligard.  Status post palliative radiation to spine. PMSA PET scan showed partial response. Patient is not a candidate for chemotherapy with docetaxel. Labs are reviewed and discussed with patient.  He tolerates Xtandi 160 mg daily, continue current regimen.  Fatigue/brain fog could be due to CNS side effects. Manageable.  Skin breakdown of sacrum area could be secondary to pressure ulcer.  I was not able to visualize during this visit in the clinic room due to quadriplegia.  Recommend patient to have RN check the area during home nurse visit.  I discussed about pressure ulcer prevention measures.

## 2022-04-25 NOTE — Assessment & Plan Note (Signed)
Continue follow up with urology

## 2022-05-06 ENCOUNTER — Telehealth: Payer: Self-pay | Admitting: *Deleted

## 2022-05-06 NOTE — Telephone Encounter (Signed)
Patient wife Jason [name] called asking for a letter to excuse her from jury duty since she is patient primary care giver.

## 2022-05-07 ENCOUNTER — Encounter: Payer: Self-pay | Admitting: Oncology

## 2022-05-07 NOTE — Telephone Encounter (Signed)
Letter to be left at registration for [facility]. She is aware.

## 2022-05-07 NOTE — Telephone Encounter (Signed)
Juror Number [id number] Date of service 06/09/22

## 2022-05-07 NOTE — Telephone Encounter (Signed)
Mrs Ellender would like to be notified when it is ready

## 2022-05-09 ENCOUNTER — Telehealth: Payer: Self-pay

## 2022-05-09 NOTE — Telephone Encounter (Signed)
Mychart message has been sent to pt asking if they would like to have injections arranged at home or to try to see if it can get approved for facility administration.

## 2022-05-09 NOTE — Telephone Encounter (Signed)
-----   Message from Blenda Peals sent at 05/08/2022  4:00 PM EDT ----- Regarding: Eligard  Dr. Tasia Catchings,  Patient's Jason [name] is now part of the [facility] program which means his insurance prefers him to have receive eligard in a less invasive setting such as home. A request can be submitted for the patient to continue to receive eligard at the facility. In order to submit the request, a medical necessity letter needs to be submitted explaining why it is necessary for him continue to receive it at the facility.  Please advise once the letter is completed to submit the request.   Thanks!

## 2022-05-09 NOTE — Telephone Encounter (Signed)
Letter has been sent to [facility] and they will submit to insurance. Letter available under letters tab.

## 2022-05-09 NOTE — Telephone Encounter (Signed)
Seth Bake, I will work on letter for pt to receive medicaiton in clinic   Rachel/ Evelena Peat, suggestion was made by Christella Scheuermann to apply for copay assistance for Eligard, would you guys be able to assist with this?

## 2022-05-20 ENCOUNTER — Encounter (HOSPITAL_COMMUNITY): Payer: Self-pay | Admitting: *Deleted

## 2022-05-20 ENCOUNTER — Other Ambulatory Visit: Payer: Self-pay

## 2022-05-20 NOTE — Progress Notes (Signed)
Spoke with pt and his wife for pre-op call. Pt states he had an irregular HR in 2022. He states Dr. Saunders Revel is his cardiologist. In notes in 2022 from Dr. Saunders Revel, he reports patient was having palpitations. Dr. Saunders Revel also noted pt does not have any significant coronary artery disease. Pt is not diabetic.   Pt has prostate cancer metastatic to CNS. 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 Pt will be arriving via transport company.   Pre-op instructions given and they both voiced understanding.

## 2022-05-21 ENCOUNTER — Ambulatory Visit: Payer: BC Managed Care – PPO | Admitting: Urology

## 2022-05-21 DIAGNOSIS — N400 Enlarged prostate without lower urinary tract symptoms: Secondary | ICD-10-CM

## 2022-05-21 DIAGNOSIS — C7940 Secondary malignant neoplasm of unspecified part of nervous system: Secondary | ICD-10-CM

## 2022-05-21 DIAGNOSIS — Z466 Encounter for fitting and adjustment of urinary device: Secondary | ICD-10-CM

## 2022-05-21 DIAGNOSIS — G8250 Quadriplegia, unspecified: Secondary | ICD-10-CM | POA: Diagnosis not present

## 2022-05-21 DIAGNOSIS — Z8673 Personal history of transient ischemic attack (TIA), and cerebral infarction without residual deficits: Secondary | ICD-10-CM

## 2022-05-21 DIAGNOSIS — Z7982 Long term (current) use of aspirin: Secondary | ICD-10-CM

## 2022-05-21 DIAGNOSIS — Z86718 Personal history of other venous thrombosis and embolism: Secondary | ICD-10-CM

## 2022-05-21 DIAGNOSIS — I10 Essential (primary) hypertension: Secondary | ICD-10-CM

## 2022-05-21 DIAGNOSIS — M4802 Spinal stenosis, cervical region: Secondary | ICD-10-CM | POA: Diagnosis not present

## 2022-05-21 DIAGNOSIS — C61 Malignant neoplasm of prostate: Secondary | ICD-10-CM

## 2022-05-21 DIAGNOSIS — M6281 Muscle weakness (generalized): Secondary | ICD-10-CM

## 2022-05-21 DIAGNOSIS — E785 Hyperlipidemia, unspecified: Secondary | ICD-10-CM

## 2022-05-21 DIAGNOSIS — I471 Supraventricular tachycardia, unspecified: Secondary | ICD-10-CM

## 2022-05-21 DIAGNOSIS — E876 Hypokalemia: Secondary | ICD-10-CM

## 2022-05-22 ENCOUNTER — Ambulatory Visit (HOSPITAL_COMMUNITY)
Admission: RE | Admit: 2022-05-22 | Discharge: 2022-05-22 | Disposition: A | Discharge status: Home / Self Care | Payer: Managed Care, Other (non HMO) | Attending: Internal Medicine | Admitting: Internal Medicine

## 2022-05-22 ENCOUNTER — Ambulatory Visit (HOSPITAL_COMMUNITY): Payer: Managed Care, Other (non HMO) | Admitting: Certified Registered"

## 2022-05-22 ENCOUNTER — Other Ambulatory Visit: Payer: Self-pay

## 2022-05-22 ENCOUNTER — Ambulatory Visit (HOSPITAL_COMMUNITY)
Admission: RE | Admit: 2022-05-22 | Discharge: 2022-05-22 | Disposition: A | Discharge status: Home / Self Care | Payer: Managed Care, Other (non HMO) | Source: Ambulatory Visit | Attending: Internal Medicine | Admitting: Internal Medicine

## 2022-05-22 ENCOUNTER — Ambulatory Visit (HOSPITAL_BASED_OUTPATIENT_CLINIC_OR_DEPARTMENT_OTHER): Payer: Managed Care, Other (non HMO) | Admitting: Certified Registered"

## 2022-05-22 ENCOUNTER — Encounter (HOSPITAL_COMMUNITY): Payer: Self-pay | Admitting: Internal Medicine

## 2022-05-22 ENCOUNTER — Encounter (HOSPITAL_COMMUNITY)
Admission: RE | Disposition: A | Discharge status: Home / Self Care | Payer: Self-pay | Source: Home / Self Care | Attending: Internal Medicine

## 2022-05-22 DIAGNOSIS — M4606 Spinal enthesopathy, lumbar region: Secondary | ICD-10-CM | POA: Insufficient documentation

## 2022-05-22 DIAGNOSIS — C7940 Secondary malignant neoplasm of unspecified part of nervous system: Secondary | ICD-10-CM | POA: Insufficient documentation

## 2022-05-22 DIAGNOSIS — C61 Malignant neoplasm of prostate: Secondary | ICD-10-CM

## 2022-05-22 DIAGNOSIS — I493 Ventricular premature depolarization: Secondary | ICD-10-CM | POA: Diagnosis not present

## 2022-05-22 DIAGNOSIS — I10 Essential (primary) hypertension: Secondary | ICD-10-CM

## 2022-05-22 DIAGNOSIS — M1990 Unspecified osteoarthritis, unspecified site: Secondary | ICD-10-CM | POA: Insufficient documentation

## 2022-05-22 DIAGNOSIS — M419 Scoliosis, unspecified: Secondary | ICD-10-CM | POA: Insufficient documentation

## 2022-05-22 DIAGNOSIS — I517 Cardiomegaly: Secondary | ICD-10-CM | POA: Insufficient documentation

## 2022-05-22 DIAGNOSIS — M48061 Spinal stenosis, lumbar region without neurogenic claudication: Secondary | ICD-10-CM | POA: Diagnosis not present

## 2022-05-22 DIAGNOSIS — E039 Hypothyroidism, unspecified: Secondary | ICD-10-CM | POA: Diagnosis not present

## 2022-05-22 DIAGNOSIS — C7951 Secondary malignant neoplasm of bone: Secondary | ICD-10-CM | POA: Diagnosis not present

## 2022-05-22 DIAGNOSIS — K219 Gastro-esophageal reflux disease without esophagitis: Secondary | ICD-10-CM | POA: Insufficient documentation

## 2022-05-22 HISTORY — DX: Cerebral infarction, unspecified: I63.9

## 2022-05-22 HISTORY — PX: RADIOLOGY WITH ANESTHESIA: SHX6223

## 2022-05-22 HISTORY — DX: Pneumonia, unspecified organism: J18.9

## 2022-05-22 SURGERY — MRI WITH ANESTHESIA
Anesthesia: General

## 2022-05-22 MED ORDER — ONDANSETRON HCL 4 MG/2ML IJ SOLN
INTRAMUSCULAR | Status: DC | PRN
  Administered 2022-05-22: 4 mg via INTRAVENOUS

## 2022-05-22 MED ORDER — PHENYLEPHRINE 80 MCG/ML (10ML) SYRINGE FOR IV PUSH (FOR BLOOD PRESSURE SUPPORT)
PREFILLED_SYRINGE | INTRAVENOUS | Status: DC | PRN
  Administered 2022-05-22 (×2): 240 ug via INTRAVENOUS

## 2022-05-22 MED ORDER — DEXAMETHASONE SODIUM PHOSPHATE 10 MG/ML IJ SOLN
INTRAMUSCULAR | Status: DC | PRN
  Administered 2022-05-22: 10 mg via INTRAVENOUS

## 2022-05-22 MED ORDER — LACTATED RINGERS IV SOLN: INTRAVENOUS | Status: DC

## 2022-05-22 MED ORDER — PROPOFOL 10 MG/ML IV BOLUS
INTRAVENOUS | Status: DC | PRN
  Administered 2022-05-22: 170 mg via INTRAVENOUS

## 2022-05-22 MED ORDER — ORAL CARE MOUTH RINSE: 15.0000 mL | Freq: Once | OROMUCOSAL | Status: AC

## 2022-05-22 MED ORDER — CHLORHEXIDINE GLUCONATE 0.12 % MT SOLN
15.0000 mL | Freq: Once | OROMUCOSAL | Status: AC
  Administered 2022-05-22: 15 mL via OROMUCOSAL
  Filled 2022-05-22: qty 15

## 2022-05-22 MED ORDER — EPHEDRINE SULFATE-NACL 50-0.9 MG/10ML-% IV SOSY
PREFILLED_SYRINGE | INTRAVENOUS | Status: DC | PRN
  Administered 2022-05-22: 15 mg via INTRAVENOUS
  Administered 2022-05-22: 10 mg via INTRAVENOUS

## 2022-05-22 MED ORDER — LIDOCAINE 2% (20 MG/ML) 5 ML SYRINGE
INTRAMUSCULAR | Status: DC | PRN
  Administered 2022-05-22: 100 mg via INTRAVENOUS

## 2022-05-22 MED ORDER — MIDAZOLAM HCL 2 MG/2ML IJ SOLN
INTRAMUSCULAR | Status: DC | PRN
  Administered 2022-05-22: 2 mg via INTRAVENOUS

## 2022-05-22 MED ORDER — GADOBUTROL 1 MMOL/ML IV SOLN
8.0000 mL | Freq: Once | INTRAVENOUS | Status: AC | PRN
  Administered 2022-05-22: 8 mL via INTRAVENOUS

## 2022-05-22 MED ORDER — MIDAZOLAM HCL 2 MG/2ML IJ SOLN
INTRAMUSCULAR | Status: AC
  Filled 2022-05-22: qty 2

## 2022-05-22 MED ORDER — PHENYLEPHRINE HCL-NACL 20-0.9 MG/250ML-% IV SOLN
INTRAVENOUS | Status: DC | PRN
  Administered 2022-05-22: 50 ug/min via INTRAVENOUS

## 2022-05-22 NOTE — Anesthesia Preprocedure Evaluation (Signed)
Anesthesia Evaluation  Patient identified by MRN, date of birth, ID band Patient awake    Reviewed: Allergy & Precautions, NPO status , Patient's Chart, lab work & pertinent test results  History of Anesthesia Complications Negative for: history of anesthetic complications  Airway Mallampati: III  TM Distance: >3 FB Neck ROM: Full    Dental  (+) Teeth Intact, Dental Advisory Given   Pulmonary neg pulmonary ROS   Pulmonary exam normal        Cardiovascular hypertension, Pt. on medications Normal cardiovascular exam  Echo 06/2020  1. Left ventricular ejection fraction, by estimation, is 50 to 55%. The left ventricle has low normal function. The left ventricle has no regional wall motion abnormalities. There is mild left ventricular hypertrophy. Left ventricular diastolic parameters are consistent with Grade I diastolic dysfunction (impaired relaxation). The average left ventricular global longitudinal strain is -13.2 %. The global longitudinal strain is abnormal.  2. Right ventricular systolic function is low normal. The right ventricular size is normal.  3. The mitral valve is normal in structure. No evidence of mitral valve regurgitation.  4. The aortic valve is tricuspid. Aortic valve regurgitation is not visualized.    LHC 02/2020 1. No angiographically significant coronary artery disease. 2. Normal left ventricular contraction with mildly elevated filling pressures consistent with diastolic dysfunction.  Recommendations: 1. Primary prevention of coronary artery disease. 2. Continue blood pressure control as well as recently added metoprolol for management of incidentally noted PVCs.  If symptoms persist, ambulatory cardiac monitoring +/- echcardiography will need to be considered.     Neuro/Psych negative neurological ROS  negative psych ROS   GI/Hepatic Neg liver ROS,GERD  Medicated and Controlled,,  Endo/Other   Hypothyroidism    Renal/GU negative Renal ROS     Musculoskeletal  (+) Arthritis , Osteoarthritis,    Abdominal Normal abdominal exam  (+)   Peds  Hematology   Anesthesia Other Findings   Reproductive/Obstetrics                                                              Anesthesia Evaluation  Patient identified by MRN, date of birth, ID band Patient awake    Reviewed: Allergy & Precautions, NPO status , Patient's Chart, lab work & pertinent test results  Airway Mallampati: II  TM Distance: >3 FB Neck ROM: Full    Dental no notable dental hx.    Pulmonary neg pulmonary ROS,    Pulmonary exam normal        Cardiovascular hypertension, Pt. on medications and Pt. on home beta blockers  Rhythm:Regular Rate:Normal     Neuro/Psych Anxiety With claustrophobia, unable to remain still with prior imaging attemptsnegative neurological ROS     GI/Hepatic Neg liver ROS, GERD  ,  Endo/Other  Hypothyroidism   Renal/GU negative Renal ROS     Musculoskeletal  (+) Arthritis , Osteoarthritis,    Abdominal Normal abdominal exam  (+)   Peds  Hematology negative hematology ROS (+)   Anesthesia Other Findings   Reproductive/Obstetrics                           Anesthesia Physical Anesthesia Plan  ASA: 2  Anesthesia Plan: General   Post-op Pain Management:  Induction: Intravenous  PONV Risk Score and Plan: 2 and Ondansetron, Dexamethasone, Midazolam and Treatment may vary due to age or medical condition  Airway Management Planned: Mask and LMA  Additional Equipment: None  Intra-op Plan:   Post-operative Plan: Extubation in OR  Informed Consent: I have reviewed the patients History and Physical, chart, labs and discussed the procedure including the risks, benefits and alternatives for the proposed anesthesia with the patient or authorized representative who has indicated his/her understanding  and acceptance.     Dental advisory given  Plan Discussed with: CRNA  Anesthesia Plan Comments: (PAT note by Karoline Caldwell, PA-C: Patient is undergone thorough cardiology evaluation for chest pain as well as palpitations, PSVT, NSVT last seen by Dr. In 01/24/2021.  Per note, "Symptoms have been quiescent with low-dose metoprolol.  Prior cath showed absence of CAD.  Echo was without significant structural abnormality.  Continue current dose of metoprolol.  No further work-up recommended at this time."  98-month follow-up recommended.  Patient will need day of surgery labs and evaluation.  EKG 03/11/2021: Normal sinus rhythm.  Rate 70. Nonspecific ST abnormality  TTE 07/19/2020:  1. Left ventricular ejection fraction, by estimation, is 50 to 55%. The  left ventricle has low normal function. The left ventricle has no regional  wall motion abnormalities. There is mild left ventricular hypertrophy.  Left ventricular diastolic  parameters are consistent with Grade I diastolic dysfunction (impaired  relaxation). The average left ventricular global longitudinal strain is  -13.2 %. The global longitudinal strain is abnormal.   2. Right ventricular systolic function is low normal. The right  ventricular size is normal.   3. The mitral valve is normal in structure. No evidence of mitral valve  regurgitation.   4. The aortic valve is tricuspid. Aortic valve regurgitation is not  visualized.   Event monitor 04/25/2020: . The patient was monitored for 14 days. . The predominant rhythm was sinus with an average rate of 85 bpm (range 47 - 122 bpm in sinus). . There were rare PAC's and occasional PVC's. . There were 6 atrial runs lasting up to 10.8 seconds with a maximum rate of 154 bpm. . There were 9 episodes of wide complex tachycardia lasting up to 8 beats with a maximum rate of 231 bpm. Episodes most likely represent NSVT, though some tracings could also reflect SVT with aberrancy. . No sustained  arrhythmia or prolonged pause was identified. . Patient triggered events correspond to sinus rhythm with PVC's.   Sinus rhythm with rare PAC's and occasional PVC's.  Brief runs of PSVT and NSVT noted, as detailed above.  Cath 03/20/2020: Conclusions: 1. No angiographically significant coronary artery disease. 2. Normal left ventricular contraction with mildly elevated filling pressures consistent with diastolic dysfunction.   Recommendations: 1. Primary prevention of coronary artery disease. 2. Continue blood pressure control as well as recently added metoprolol for management of incidentally noted PVCs.  If symptoms persist, ambulatory cardiac monitoring +/- echcardiography will need to be considered. )      Anesthesia Quick Evaluation  Anesthesia Physical Anesthesia Plan  ASA: 3  Anesthesia Plan: General   Post-op Pain Management: Minimal or no pain anticipated   Induction: Intravenous  PONV Risk Score and Plan: 2 and Dexamethasone, Treatment may vary due to age or medical condition, Ondansetron and Midazolam  Airway Management Planned: LMA  Additional Equipment: None  Intra-op Plan:   Post-operative Plan: Extubation in OR  Informed Consent: I have reviewed the patients History and Physical,  chart, labs and discussed the procedure including the risks, benefits and alternatives for the proposed anesthesia with the patient or authorized representative who has indicated his/her understanding and acceptance.     Dental advisory given  Plan Discussed with: CRNA  Anesthesia Plan Comments: (PAT note by Karoline Caldwell, PA-C: Patient recently underwent thorough cardiology evaluationfor chest pain as well as palpitations, PSVT, NSVT.  Workup essentially benign.  Cath with no angiographically significant CAD.  Last seen by Dr. Saunders Revel 08/08/2021.  Per note, stable from cardiac state standpoint, no symptomatic ectopy.  No changes made to management.  42-month follow-up  recommended.  Patient will need day of surgery labs and evaluation.  EKG 03/11/2021:Normal sinus rhythm. Rate 70. Nonspecific ST abnormality IMPRESSIONS     1. Left ventricular ejection fraction, by estimation, is 55 to 60%. The  left ventricle has normal function. The left ventricle has no regional  wall motion abnormalities. Left ventricular diastolic parameters were  normal.   2. Right ventricular systolic function is normal. The right ventricular  size is normal.   3. The mitral valve is normal in structure. Mild mitral valve  regurgitation. No evidence of mitral stenosis.   4. The aortic valve is normal in structure. Aortic valve regurgitation is  not visualized. Mild aortic valve stenosis.   5. The inferior vena cava is normal in size with greater than 50%  respiratory variability, suggesting right atrial pressure of 3 mmHg.   FINDINGS   Left Ventricle: Left ventricular ejection fraction, by estimation, is 55  to   TTE 07/19/2020: 1. Left ventricular ejection fraction, by estimation, is 50 to 55%. The  left ventricle has low normal function. The left ventricle has no regional  wall motion abnormalities. There is mild left ventricular hypertrophy.  Left ventricular diastolic  parameters are consistent with Grade I diastolic dysfunction (impaired  relaxation). The average left ventricular global longitudinal strain is  -13.2 %. The global longitudinal strain is abnormal.  2. Right ventricular systolic function is low normal. The right  ventricular size is normal.  3. The mitral valve is normal in structure. No evidence of mitral valve  regurgitation.  4. The aortic valve is tricuspid. Aortic valve regurgitation is not  visualized.   Event monitor 04/25/2020:  The patient was monitored for 14 days.  The predominant rhythm was sinus with an average rate of 85 bpm (range 47 - 122 bpm in sinus).  There were rare PAC's and occasional PVC's.  There were 6 atrial  runs lasting up to 10.8 seconds with a maximum rate of 154 bpm.  There were 9 episodes of wide complex tachycardia lasting up to 8 beats with a maximum rate of 231 bpm. Episodes most likely represent NSVT, though some tracings could also reflect SVT with aberrancy.  No sustained arrhythmia or prolonged pause was identified.  Patient triggered events correspond to sinus rhythm with PVC's.  Sinus rhythm with rare PAC's and occasional PVC's. Brief runs of PSVT and NSVT noted, as detailed above.  Cath 03/20/2020: Conclusions: 1. No angiographically significant coronary artery disease. 2. Normal left ventricular contraction with mildly elevated filling pressures consistent with diastolic dysfunction.  Recommendations: 1. Primary prevention of coronary artery disease. 2. Continue blood pressure control as well as recently added metoprolol for management of incidentally noted PVCs. If symptoms persist, ambulatory cardiac monitoring +/- echcardiography will need to be considered.  )        Anesthesia Quick Evaluation

## 2022-05-22 NOTE — Anesthesia Procedure Notes (Signed)
Procedure Name: LMA Insertion Date/Time: 05/22/2022 10:39 AM  Performed by: Thelma Comp, CRNAPre-anesthesia Checklist: Patient identified, Emergency Drugs available, Suction available and Patient being monitored Patient Re-evaluated:Patient Re-evaluated prior to induction Oxygen Delivery Method: Circle System Utilized Preoxygenation: Pre-oxygenation with 100% oxygen Induction Type: IV induction Ventilation: Mask ventilation without difficulty LMA: LMA inserted LMA Size: 5.0 Number of attempts: 1 Placement Confirmation: positive ETCO2 Tube secured with: Tape Dental Injury: Teeth and Oropharynx as per pre-operative assessment

## 2022-05-22 NOTE — Anesthesia Postprocedure Evaluation (Signed)
Anesthesia Post Note  Patient: Jason [name]  Procedure(s) Performed: MRI LUMBER SPINE AND CERVIC SPINE WITH AND WITHOUT CONTRAST     Patient location during evaluation: PACU Anesthesia Type: General Level of consciousness: awake Pain management: pain level controlled Vital Signs Assessment: post-procedure vital signs reviewed and stable Respiratory status: spontaneous breathing Cardiovascular status: stable Postop Assessment: no apparent nausea or vomiting Anesthetic complications: no  No notable events documented.  Last Vitals:  Vitals:   05/22/22 1230 05/22/22 1245  BP: 123/80 128/72  Pulse: 87 83  Resp: 16 15  Temp:    SpO2: 95% 95%    Last Pain:  Vitals:   05/22/22 0729  TempSrc:   PainSc: 0-No pain                 Huston Foley

## 2022-05-22 NOTE — Transfer of Care (Signed)
Immediate Anesthesia Transfer of Care Note  Patient: Jason [name]  Procedure(s) Performed: MRI LUMBER SPINE AND CERVIC SPINE WITH AND WITHOUT CONTRAST  Patient Location: PACU  Anesthesia Type:General  Level of Consciousness: awake and alert   Airway & Oxygen Therapy: Patient Spontanous Breathing and Patient connected to face mask oxygen  Post-op Assessment: Report given to RN and Post -op Vital signs reviewed and stable  Post vital signs: Reviewed and stable  Last Vitals:  Vitals Value Taken Time  BP 129/82 05/22/22 1217  Temp 98   Pulse 91 05/22/22 1224  Resp 18 05/22/22 1224  SpO2 95 % 05/22/22 1224  Vitals shown include unvalidated device data.  Last Pain:  Vitals:   05/22/22 0729  TempSrc:   PainSc: 0-No pain         Complications: No notable events documented.

## 2022-05-28 ENCOUNTER — Other Ambulatory Visit: Payer: Self-pay | Admitting: *Deleted

## 2022-05-29 ENCOUNTER — Telehealth: Payer: Self-pay

## 2022-05-29 NOTE — Telephone Encounter (Signed)
Prescription confirmation forms received from [facility] confirming lisinopril 10 mg by mouth daily. Forms signed by Dr. Saunders Revel and faxed returned to facility.

## 2022-05-30 ENCOUNTER — Inpatient Hospital Stay: Payer: Managed Care, Other (non HMO) | Attending: Anatomic Pathology & Clinical Pathology | Admitting: Internal Medicine

## 2022-05-30 ENCOUNTER — Encounter: Payer: Self-pay | Admitting: Internal Medicine

## 2022-05-30 VITALS — BP 151/89 | HR 68 | Temp 98.5°F | Resp 16

## 2022-05-30 DIAGNOSIS — M625A9 Muscle wasting and atrophy, not elsewhere classified, back, unspecified level: Secondary | ICD-10-CM | POA: Insufficient documentation

## 2022-05-30 DIAGNOSIS — M4802 Spinal stenosis, cervical region: Secondary | ICD-10-CM | POA: Diagnosis not present

## 2022-05-30 DIAGNOSIS — Z993 Dependence on wheelchair: Secondary | ICD-10-CM | POA: Diagnosis not present

## 2022-05-30 DIAGNOSIS — Z8249 Family history of ischemic heart disease and other diseases of the circulatory system: Secondary | ICD-10-CM | POA: Insufficient documentation

## 2022-05-30 DIAGNOSIS — Z79890 Hormone replacement therapy (postmenopausal): Secondary | ICD-10-CM | POA: Diagnosis not present

## 2022-05-30 DIAGNOSIS — Z8349 Family history of other endocrine, nutritional and metabolic diseases: Secondary | ICD-10-CM | POA: Insufficient documentation

## 2022-05-30 DIAGNOSIS — I10 Essential (primary) hypertension: Secondary | ICD-10-CM | POA: Diagnosis not present

## 2022-05-30 DIAGNOSIS — Z823 Family history of stroke: Secondary | ICD-10-CM | POA: Diagnosis not present

## 2022-05-30 DIAGNOSIS — Z8673 Personal history of transient ischemic attack (TIA), and cerebral infarction without residual deficits: Secondary | ICD-10-CM | POA: Insufficient documentation

## 2022-05-30 DIAGNOSIS — M47816 Spondylosis without myelopathy or radiculopathy, lumbar region: Secondary | ICD-10-CM | POA: Insufficient documentation

## 2022-05-30 DIAGNOSIS — C7940 Secondary malignant neoplasm of unspecified part of nervous system: Secondary | ICD-10-CM | POA: Diagnosis not present

## 2022-05-30 DIAGNOSIS — Z79899 Other long term (current) drug therapy: Secondary | ICD-10-CM | POA: Insufficient documentation

## 2022-05-30 DIAGNOSIS — K219 Gastro-esophageal reflux disease without esophagitis: Secondary | ICD-10-CM | POA: Diagnosis not present

## 2022-05-30 DIAGNOSIS — G8250 Quadriplegia, unspecified: Secondary | ICD-10-CM

## 2022-05-30 DIAGNOSIS — M47812 Spondylosis without myelopathy or radiculopathy, cervical region: Secondary | ICD-10-CM | POA: Insufficient documentation

## 2022-05-30 DIAGNOSIS — C7951 Secondary malignant neoplasm of bone: Secondary | ICD-10-CM | POA: Diagnosis present

## 2022-05-30 DIAGNOSIS — Z87442 Personal history of urinary calculi: Secondary | ICD-10-CM | POA: Diagnosis not present

## 2022-05-30 DIAGNOSIS — Z803 Family history of malignant neoplasm of breast: Secondary | ICD-10-CM | POA: Insufficient documentation

## 2022-05-30 DIAGNOSIS — I700 Atherosclerosis of aorta: Secondary | ICD-10-CM | POA: Diagnosis not present

## 2022-05-30 DIAGNOSIS — Z801 Family history of malignant neoplasm of trachea, bronchus and lung: Secondary | ICD-10-CM | POA: Diagnosis not present

## 2022-05-30 DIAGNOSIS — Z923 Personal history of irradiation: Secondary | ICD-10-CM | POA: Insufficient documentation

## 2022-05-30 DIAGNOSIS — C61 Malignant neoplasm of prostate: Secondary | ICD-10-CM

## 2022-05-30 DIAGNOSIS — Z814 Family history of other substance abuse and dependence: Secondary | ICD-10-CM | POA: Diagnosis not present

## 2022-05-30 DIAGNOSIS — M48061 Spinal stenosis, lumbar region without neurogenic claudication: Secondary | ICD-10-CM | POA: Insufficient documentation

## 2022-05-30 DIAGNOSIS — Z800 Family history of malignant neoplasm of digestive organs: Secondary | ICD-10-CM | POA: Insufficient documentation

## 2022-05-30 NOTE — Progress Notes (Signed)
[facility] [address]  [place], [place] [address] [phone]   Interval Evaluation  Date of Service: 05/30/22 Patient Name: Jason [name] Patient MRN: [id number] Patient DOB: 1955/07/15 Provider: Henreitta Leber, MD  Identifying Statement:  Jason [name] is a 67 y.o. 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  Associated epidural disease greater at the C6 where there is marked canal stenosis without cord compression.  Left greater than right foraminal effacement due to the extraosseous disease.   10/03/2021 Imaging   MRI left shoulder showed High-riding humeral head with massive full-thickness tear of the entire supraspinatus tendon and the anterior 50% of the infraspinatus tendon. Additional partial-thickness articular sided tearing of the posterior infraspinatus. Moderate to high-grade supraspinatus and infraspinatus muscle atrophy suggests these tears  are chronic. Partial-thickness tearing of the superior greater than inferior  aspects of the subscapularis tendon footprint. Mild subscapularis muscle atrophy.  Moderate degenerative changes of the acromioclavicular joint. Moderate glenohumeral cartilage degenerative changes.   10/05/2021 Imaging   CT chest abdomen pelvis with contrast showed  Moderate severity retroperitoneal and pelvic lymphadenopathy,consistent with metastatic disease. Small lytic areas at the levels of L4 and S1, with diffusely sclerotic changes involving the second and fourth right ribs, sacrum and left iliac bone. These findings are likely consistent  with osseous metastasis. 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  Sigmoid diverticulosis. Aortic atherosclerosis   10/05/2021 Imaging   CT cervical spine without contrast Extensive heterogeneous sclerotic lesions throughout the cervical spine compatible with metastatic disease to the bone.  Mixed sclerotic and lytic changes in the posterior elements at C5 and C6.Extraosseous tumor at C5 and C6 is better appreciated on the MRI scan.   10/05/2021 Tumor Marker   PSA >1500   10/06/2021 Cancer Staging   Staging form: Prostate, AJCC 8th Edition - Clinical stage from 10/06/2021: Stage IVB (cN1, pM1c, PSA: 1500) - Signed by [name], MD on 12/04/2021 Stage prefix: Initial diagnosis Prostate specific antigen (PSA) range: 20 or greater   10/08/2021 Imaging   MRI thoracic spine and the lumbar spine with and without contrast 1. 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 2. 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 3. Additional epidural tumor along the posterior endplates at T9,T11, and T12 without high-grade spinal canal stenosis or cord compression. 4. Extensive epidural tumor in the lumbar spine is greatest in thickness at L1 without high-grade spinal canal stenosis at this level; however, superimposed on pre-existing degenerative changes lower in the lumbar spine results in severe spinal canal stenosis with cauda equina nerve root compression at L3-L4 and L4-L5. Multilevel neural foraminal stenosis is detailed above. 5. Postsurgical changes reflecting C6 laminectomy unchanged spinal canal stenosis compared to the postoperative study from 1 day prior.No definite cord signal  abnormality. 6. Extramedullary fluid collection along the dorsal aspect of the cord beginning at T9-T10 extending into the lumbar spine is  likely subdural in location but is of uncertain etiology; evolving blood products not excluded. The collection anteriorly displaces the cord and cauda equina nerve roots without frank cord compression or signal abnormality. There is no peripheral enhancement to suggest abscess. 7. Prevertebral edema in the cervical spine which may be postsurgical in nature. 8. New bilateral lower lobe consolidations may reflect atelectasis or developing pneumonia/aspiration.   10/08/2021 Imaging   MRI brain with and without contrast showed 1. 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 There is intracranial extension of tumor along the right frontal convexity measuring up to 5 mm in thickness without mass effect on the underlying brain parenchyma or midline shift. Extracranial extension of tumor in this location measures up to approximately 3 mm. 2. Additional extracranial extension of tumor into the scalp along the vertex measuring up to 7 mm in thickness. 3. Small focus of diffusion restriction in the right parietal cortex most in keeping with a small acute infarct. 4. No evidence of parenchymal metastatic disease.   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 10/06/2021, patient underwent C6 cervical laminectomy and resection of tumor.  Pathology came back positive for metastatic prostate cancer. 8/14 developed significant bilateral upper and lower extremity weakness with hypotension requiring transfer to the ICU, pressors, steroid, respiratory failure on mechanical ventilation. Later patient was stabilized and extubated.  8/16 Firmagon loading dose 240mg   9/15 Eligard 22.5mg  given inpatient during his rehab admission.      10/2021 -  Radiation Therapy   Palliative radiation to spine.    12/04/2021 Tumor Marker   PSA 67.09   01/13/2022 Tumor Marker   PSA 43.95   01/23/2022 -  Chemotherapy   Started on Xtandi 160mg  daily.     CNS Oncologic History 10/06/21: Laminectomy, resection of epidural tumor ~C6 with Dr. Marcell Barlow.   11/13/21: Post-op radiation, 30/10 from C4 to T2 with Dr. Kathrynn Running  Interval History: Jason [name] presents today for follow up after recent cervical spine and lumbar spine MRI studies.  He reports continued improved strength in both legs, left arm.  Indwelling foley is still present, urology may place suprapubic catheter upcoming.  He remains in the motorized wheelchair, is working aggressively with physical and occupational therapy.  Continues on prostate cancer therapy with Dr. Cathie Hoops.  No new or progressive symptoms otherwise.  H+P (12/20/21) Patient presented to neurologic attention in August with new onset numbness in left arm and hand.  This had been proceeded by several months of pain in back of his neck.  Spine imaging demonstrated diffuse metastatic burden and tumor infiltrating spinal column in cervical region.  He underwent laminectomy and debulking of epidural tumor with Dr. Marcell Barlow on 10/06/21 at [place].  This was then followed by post-operative radiation therapy with Dr. Kathrynn Running in [place], also to cervical and upper thoracic region, complete 9/20 after 10 sessions.  No surgery or radiation has been focused on lower spine to this point.  At present, he is wheelchair bound due to bilateral leg weakness, which is improving gradually through aggressive PT.  His arms and hands are functional aside from more chronic weakness in his left shoulder.  He is incontinent of urine and has indwelling foley, but he does have bladder and saddle sensation intact.     Medications: Current Outpatient Medications on File Prior to Visit  Medication Sig Dispense Refill   acetaminophen  (  TYLENOL) 325 MG tablet Take 1-2 tablets (325-650 mg total) by mouth every 4 (four) hours as needed for mild pain. (Patient taking differently: Take 1,000 mg by mouth 2 (two) times daily.)     ascorbic acid (VITAMIN C) 500 MG tablet Take 1 tablet (500 mg total) by mouth daily. (Patient taking differently: Take 1,000 mg by mouth daily.) 30 tablet 0   aspirin EC 81 MG tablet Take 1 tablet (81 mg total) by mouth daily. Swallow whole. (Patient taking differently: Take 162 mg by mouth in the morning. Swallow whole.) 30 tablet 12   atorvastatin (LIPITOR) 40 MG tablet Take 1 tablet (40 mg total) by mouth daily. 90 tablet 3   cetirizine (ZYRTEC) 10 MG tablet Take 10 mg by mouth at bedtime.     clonazePAM (KLONOPIN) 0.5 MG tablet Take 1 tablet (0.5 mg total) by mouth 2 (two) times daily as needed for anxiety. (Patient taking differently: Take 0.5 mg by mouth 2 (two) times daily.) 180 tablet 0   CRANBERRY PO Take 4,200 mg by mouth 2 (two) times daily. W/Vit C     enzalutamide (XTANDI) 40 MG tablet Take 4 tablets (160 mg total) by mouth daily. 60 tablet 4   famotidine (PEPCID) 20 MG tablet Take 1 tablet (20 mg total) by mouth daily. (Patient taking differently: Take 20 mg by mouth at bedtime.) 30 tablet 0   FLUoxetine (PROZAC) 20 MG capsule Take 1 capsule (20 mg total) by mouth at bedtime. 90 capsule 3   fluticasone (FLONASE) 50 MCG/ACT nasal spray Place 1 spray into both nostrils daily. 48 g 3   levothyroxine (SYNTHROID) 88 MCG tablet Take 1 tablet (88 mcg total) by mouth daily. 90 tablet 3   lisinopril (ZESTRIL) 10 MG tablet Take 1 tablet (10 mg total) by mouth daily. 90 tablet 3   Multiple Vitamin (MULTIVITAMIN) tablet Take 1 tablet by mouth daily.     potassium chloride (KLOR-CON M) 10 MEQ tablet Take 1 tablet (10 mEq total) by mouth at bedtime. 90 tablet 3   sennosides-docusate sodium (SENOKOT-S) 8.6-50 MG tablet Take 2 tablets by mouth daily. (Patient taking differently: Take 2 tablets by mouth daily with  lunch.) 60 tablet 11   tamsulosin (FLOMAX) 0.4 MG CAPS capsule Take 1 capsule (0.4 mg total) by mouth daily after supper. (Patient taking differently: Take 0.4 mg by mouth in the morning.) 90 capsule 3   No current facility-administered medications on file prior to visit.    Allergies: No Known Allergies Past Medical History:  Past Medical History:  Diagnosis Date   Allergy    Arthritis    osteoarthritis   GERD (gastroesophageal reflux disease)    History of kidney stones    Hypertension    Hypothyroidism    Palpitations 2022   Pneumonia    Stroke    Mini stroke - Aug 2023   Thyroid disease    Hyperthyroidism s/p radioactive iodine ablation   Past Surgical History:  Past Surgical History:  Procedure Laterality Date   CARDIAC CATHETERIZATION  03/2000   HERNIA REPAIR  1961   umbilical   LEFT HEART CATH AND CORONARY ANGIOGRAPHY Left 03/20/2020   Procedure: LEFT HEART CATH AND CORONARY ANGIOGRAPHY;  Surgeon: Yvonne Kendall, MD;  Location: [facility] INVASIVE CV LAB;  Service: Cardiovascular;  Laterality: Left;   POSTERIOR CERVICAL FUSION/FORAMINOTOMY N/A 10/06/2021   Procedure: POSTERIOR CERVICAL FUSION/ FORAMINOTOMY LEVEL 3;  Surgeon: Venetia Night, MD;  Location: [facility] ORS;  Service: Neurosurgery;  Laterality: N/A;  will  need monitoring   RADIOLOGY WITH ANESTHESIA Left 04/18/2021   Procedure: MRI SHOULDER WITHOUT CONTRAST WITH ANESTHESIA;  Surgeon: Radiologist, Medication, MD;  Location: [facility] OR;  Service: Radiology;  Laterality: Left;   RADIOLOGY WITH ANESTHESIA N/A 10/03/2021   Procedure: MRI CERVICAL SPINE WITH ANESTHESIA;  Surgeon: Radiologist, Medication, MD;  Location: [facility] OR;  Service: Radiology;  Laterality: N/A;   RADIOLOGY WITH ANESTHESIA N/A 01/30/2022   Procedure: MRI CERVICAL SPINE WITH AND WITOHOUT CONTRAST WITH ANESTHESIA;  Surgeon: Radiologist, Medication, MD;  Location: [facility] OR;  Service: Radiology;  Laterality: N/A;   RADIOLOGY WITH ANESTHESIA N/A 05/22/2022    Procedure: MRI LUMBER SPINE AND CERVIC SPINE WITH AND WITHOUT CONTRAST;  Surgeon: Radiologist, Medication, MD;  Location: [facility] OR;  Service: Radiology;  Laterality: N/A;   REVERSE SHOULDER ARTHROPLASTY Left    ROTATOR CUFF REPAIR  11/2010   Dr Hyacinth Meeker   TONSILLECTOMY     TOTAL HIP ARTHROPLASTY Right 02/24/2009   TOTAL HIP ARTHROPLASTY Left 06/2016   Dr Odis Luster   Social History:  Social History   Socioeconomic History   Marital status: Married    Spouse name: [name]    Number of children: 0   Years of education: Not on file   Highest education level: Not on file  Occupational History   Occupation: Warden/ranger: [employer]  Tobacco Use   Smoking status: Never    Passive exposure: Never   Smokeless tobacco: Never  Vaping Use   Vaping Use: Never used  Substance and Sexual Activity   Alcohol use: Not Currently    Comment: Alcohol once every few weeks/months   Drug use: No   Sexual activity: Yes  Other Topics Concern   Not on file  Social History Narrative   Lives at home with spouse.    Social Determinants of Health   Financial Resource Strain: Not on file  Food Insecurity: Not on file  Transportation Needs: Not on file  Physical Activity: Not on file  Stress: Not on file  Social Connections: Not on file  Intimate Partner Violence: Not on file   Family History:  Family History  Problem Relation Age of Onset   Hypertension Mother    Stroke Mother    Gout Father    Heart attack Father 46   Heart disease Sister    Lung cancer Sister    Drug abuse Sister    Breast cancer Paternal Aunt    Lung cancer Maternal Grandmother    Colon cancer Neg Hx    Esophageal cancer Neg Hx    Rectal cancer Neg Hx    Stomach cancer Neg Hx     Review of Systems: Constitutional: Doesn't report fevers, chills or abnormal weight loss Eyes: Doesn't report blurriness of vision Ears, nose, mouth, throat, and face: Doesn't report sore throat Respiratory: Doesn't report  cough, dyspnea or wheezes Cardiovascular: Doesn't report palpitation, chest discomfort  Gastrointestinal:  Doesn't report nausea, constipation, diarrhea GU: Doesn't report incontinence Skin: Doesn't report skin rashes Neurological: Per HPI Musculoskeletal: Doesn't report joint pain Behavioral/Psych: Doesn't report anxiety  Physical Exam: Vitals:   05/30/22 0951 05/30/22 1001  BP: (!) 159/95 (!) 151/89  Pulse: 68   Resp: 16   Temp: 98.5 F (36.9 C)    KPS: 50. General: Alert, cooperative, pleasant, in no acute distress Head: Normal EENT: No conjunctival injection or scleral icterus.  Lungs: Resp effort normal Cardiac: Regular rate Abdomen: indwelling foley Skin: No rashes cyanosis or petechiae. Extremities: No  clubbing or edema  Neurologic Exam: Mental Status: Awake, alert, attentive to examiner. Oriented to self and environment. Language is fluent with intact comprehension.  Cranial Nerves: Visual acuity is grossly normal. Visual fields are full. Extra-ocular movements intact. No ptosis. Face is symmetric Motor: Spastic tone.  Left arm 3/5 at deltoid, 5/5 elsewhere.  Some impairment in fine motor function.  Legs 3/5 bilaterally, spastic and hyperreflexic. Sensory: Intact to light touch Gait: Non ambulatory   Labs: I have reviewed the data as listed    Component Value Date/Time   NA 139 04/25/2022 0941   NA 138 04/25/2022 0941   NA 140 01/29/2021 0822   K 3.6 04/25/2022 0941   K 3.6 04/25/2022 0941   CL 101 04/25/2022 0941   CL 101 04/25/2022 0941   CO2 28 04/25/2022 0941   CO2 28 04/25/2022 0941   GLUCOSE 92 04/25/2022 0941   GLUCOSE 93 04/25/2022 0941   BUN 8 04/25/2022 0941   BUN 9 04/25/2022 0941   BUN 16 01/29/2021 0822   CREATININE 0.46 (L) 04/25/2022 0941   CREATININE 0.46 (L) 04/25/2022 0941   CALCIUM 9.1 04/25/2022 0941   CALCIUM 9.1 04/25/2022 0941   PROT 6.9 04/25/2022 0941   PROT 7.2 01/29/2021 0822   ALBUMIN 3.7 04/25/2022 0941   ALBUMIN 4.4  01/29/2021 0822   AST 26 04/25/2022 0941   ALT 18 04/25/2022 0941   ALKPHOS 88 04/25/2022 0941   BILITOT 0.7 04/25/2022 0941   GFRNONAA >60 04/25/2022 0941   GFRNONAA >60 04/25/2022 0941   GFRAA 106 03/14/2020 1254   Lab Results  Component Value Date   WBC 4.0 04/25/2022   NEUTROABS 2.7 04/25/2022   HGB 14.2 04/25/2022   HCT 43.4 04/25/2022   MCV 97.1 04/25/2022   PLT 276 04/25/2022    Imaging:  MR CERVICAL SPINE W WO CONTRAST  Result Date: 05/24/2022 CLINICAL DATA:  Metastatic prostate cancer. EXAM: MRI CERVICAL SPINE WITHOUT AND WITH CONTRAST TECHNIQUE: Multiplanar and multiecho pulse sequences of the cervical spine, to include the craniocervical junction and cervicothoracic junction, were obtained without and with intravenous contrast. CONTRAST:  8mL GADAVIST GADOBUTROL 1 MMOL/ML IV SOLN COMPARISON:  01/30/2022 FINDINGS: Alignment: Straightening of cervical lordosis. Vertebrae: Unchanged pattern of sclerotic metastatic disease which is extensive in the cervical spine with relative sparing at the level of C2-C4 where there is dense fatty marrow infiltration. No progressive or compressive tumor is seen. Cord: Cord compression is no longer seen. T2 hyperintensity in the posterior cord is more extensive than before, without cord swelling or discrete nodule, extent approximately C3-C7. There is accentuated nerve root enhancement at C4-C6 which is smooth and possibly treatment related. Epidural thickening and enhancement especially at C4-5 is stable or mildly progressed but noncompressive. Seroma at the C6 laminectomy defect, non worrisome. Posterior Fossa, vertebral arteries, paraspinal tissues: Negative Disc levels: C3-4: Uncovertebral and facet spurring eccentric to the left where there is mild foraminal narrowing C4-5: Uncovertebral and facet spurring eccentric to the right where there is mild foraminal narrowing. C5-6: Disc height loss and bulging with spurring. C6-7: Disc height loss and  bulging. Uncovertebral and facet spurring. Patent canal and foramina. C7-T1:Degenerative facet spurring. IMPRESSION: 1. Increased nonenhancing T2 signal in the cord without swelling or nodularity, presumably post treatment. 2. Epidural thickening at C4-5 is stable or slightly progressed from 01/30/2022, recommend continued follow-up. No tumoral neural compression. Electronically Signed   By: Tiburcio Pea M.D.   On: 05/24/2022 04:16   MR LUMBAR SPINE W  WO CONTRAST  Result Date: 05/24/2022 CLINICAL DATA:  Prostate cancer metastatic to CNS. Paralyzed from waist down. EXAM: MRI LUMBAR SPINE WITHOUT AND WITH CONTRAST TECHNIQUE: Multiplanar and multiecho pulse sequences of the lumbar spine were obtained without and with intravenous contrast. CONTRAST:  8mL GADAVIST GADOBUTROL 1 MMOL/ML IV SOLN COMPARISON:  10/08/2021 FINDINGS: Segmentation:  Standard. Alignment:  Mild scoliosis Vertebrae: Diffuse infiltrating tumor from osseous metastatic disease. Epidural tumor is no longer seen the level of L1-2. No extraosseous tumor extension or acute fracture. Conus medullaris and cauda equina: Conus extends to the T12-L1 level. Conus and cauda equina appear normal. Paraspinal and other soft tissues: Atrophy of intrinsic back muscles. No paravertebral tumor. Disc levels: T12- L1: Disc collapse and ventral spondylitic spurring. No impingement L1-L2: Disc collapse with ventral spondylitic spurring. No neural impingement L2-L3: Disc collapse with ventral endplate spurring. No neural impingement L3-L4: Disc narrowing and bulging with endplate and facet spurring. Ligamentum flavum thickening. Moderate spinal stenosis. L4-L5: Disc narrowing with gas and fluid contained cleft in the disc space. Degenerative endplate and facet spurring. Mild-to-moderate narrowing of the thecal sac. Patent foramina L5-S1:Disc narrowing and bulging with endplate and facet spurring. Gas containing cleft is present. There is right paracentral protrusion  near but not compressing the right S1 nerve root. Moderate left foraminal stenosis. IMPRESSION: 1. Known widespread osseous metastatic disease. Epidural tumor is no longer seen at L1-2. No tumoral impingement. 2. Generalized lumbar spine degeneration with up to moderate spinal stenosis at L3-4 and moderate foraminal narrowing on the left at L5-S1. Electronically Signed   By: Tiburcio PeaJonathan  Watts M.D.   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  MRI cervical spine demonstrates cord signal abnormality in longitudinal pattern most consistent with post radiation effects.  Lumbar spine is stable with no progressive findings.  We recommended continued imaging surveillance of the cervical spine only at this time.  Will con't to follow with Dr. Marcell BarlowYarborough, Dr. Cathie HoopsYu, urology team.  We appreciate the opportunity to participate in the care of Jason [name].   We ask that Jason [name] return to clinic in 6 months following next brain MRI, or sooner as needed.  All questions were answered. The patient knows to call the clinic with any problems, questions or concerns. No barriers to learning were detected.  The total time spent in the encounter was 30 minutes and more than 50% was on counseling and review of test results   Henreitta LeberZachary K Silvio Sausedo, MD Medical Director of Neuro-Oncology [facility] 05/30/22 11:49 AM

## 2022-06-02 ENCOUNTER — Encounter: Payer: Self-pay | Admitting: Internal Medicine

## 2022-06-09 ENCOUNTER — Telehealth: Payer: Self-pay | Admitting: Urology

## 2022-06-09 NOTE — Telephone Encounter (Signed)
Spoke with wife and she was nervous about taking the catheter out and pt not being able to stand, per wife he has limited mobility from the waste down. I advised that this appt was for discussion for SPT and if they have more concerns or questions Dr. Richardo Hanks could discuss then. Wife is also asking if it ok for home health to change his catheter on Wednesday?

## 2022-06-09 NOTE — Telephone Encounter (Signed)
Patient's wife Jason [name]) has questions/concerns about V/T appointments on Thursday 4/18. Due to transportation; they will be remaining on site all day, and not going home in between appointments. She is concerned about not being able to help him during voiding trial because he is in a wheelchair, and not able to stand. Please advise.

## 2022-06-10 NOTE — Telephone Encounter (Signed)
Called pt's wife no answer. LM for pt and wife per DPR informing them of information from Dr. Richardo Hanks. Advised to call back for questions or concerns.

## 2022-06-11 ENCOUNTER — Telehealth: Payer: Self-pay

## 2022-06-11 NOTE — Telephone Encounter (Signed)
Elnita Maxwell from [facility]  Phone [phone] secured line  Requesting  Verbal orders  Skilled nursing 1x mo for catheter changes for 2 months

## 2022-06-12 ENCOUNTER — Encounter: Payer: Self-pay | Admitting: Oncology

## 2022-06-12 ENCOUNTER — Ambulatory Visit: Payer: BC Managed Care – PPO | Admitting: Urology

## 2022-06-12 ENCOUNTER — Encounter: Payer: Self-pay | Admitting: Urology

## 2022-06-12 ENCOUNTER — Ambulatory Visit: Payer: Managed Care, Other (non HMO) | Admitting: Urology

## 2022-06-12 VITALS — BP 134/75 | HR 80

## 2022-06-12 DIAGNOSIS — N319 Neuromuscular dysfunction of bladder, unspecified: Secondary | ICD-10-CM | POA: Diagnosis not present

## 2022-06-12 DIAGNOSIS — R339 Retention of urine, unspecified: Secondary | ICD-10-CM

## 2022-06-12 NOTE — Telephone Encounter (Signed)
Verbal orders given to Cheryl. 

## 2022-06-12 NOTE — Progress Notes (Signed)
   06/12/2022 8:46 AM   Jason [name] 1956-02-19 [id number]  Reason for visit: Follow up metastatic prostate cancer, urinary retention  HPI: Complex 67 year old male with prolonged hospitalization and rehab stay August through October of 2023.  Briefly, presented with extremity weakness and was found to have PSA >1500, underwent C6 cervical laminectomy and resection of tumor on 10/06/2021 showing metastatic prostate cancer, 10/07/21 developed significant bilateral upper and lower extremity weakness with hypotension requiring transfer to the ICU, pressors, steroids, and respiratory failure requiring adequate ventilation.  Firmagon loading dose was given on 10/09/21, and he has continued on ADT.  He also received palliative radiation to the spine.  He is followed by oncology, and also is on enzalutamide(not a candidate for docetaxel).  He has been in urinary retention since that time with Foley catheter felt to be secondary to neurogenic bladder.  He was referred to urology for bladder management options.  His prostate cancer is managed by oncology(Dr. Cathie Hoops), and most recent PSA 4.68 which continues to downtrend from 42 in November, and >1500 at time of diagnosis.  Most of the history is obtained from his wife again today, and I reviewed the oncology notes extensively.  He has had an indwelling foley since that time changed on a monthly basis by home health.  He has had 2 UTIs, with symptoms of fever, 01/19/2022 with Klebsiella, most recently 03/17/2022 with Enterococcus.  At our visit in late January 2024 we started cranberry tablets and he has not had any UTIs since that time.  Overall, no major changes since our last visit.  He continues to work with PT and has gained some upper body strength, but remains wheelchair dependent.  His wife again provides most of the history.  They are both very concerned about catheter removal and risk of incontinence or urgency with inability to get to a urinal in  time, as well as his dependency for additional help.  We reviewed options again at length including a voiding trial, urodynamics, or chronic Foley, or consideration of a suprapubic tube, and the risks and benefits were discussed extensively.  After long conversation they would like to continue the chronic Foley for the time being, but they will look into considering a suprapubic tube as well.  We discussed that if he had a suprapubic tube placed we could consider a capping trial which may be slightly easier compared to having the Foley removed with risk for UTI or retention necessitating an ER visit.  Foley exchanged today, see procedure note  Continue oncology follow-up for treatment of prostate cancer If they elect to pursue suprapubic tube, okay to schedule with interventional radiology RTC with urology every 3 to 4 months to rediscuss bladder management options  I spent 45 total minutes on the day of the encounter including pre-visit review of the medical record, face-to-face time with the patient, and post visit ordering of labs/imaging/tests.    Sondra Come, MD  [facility] [address], [address] [place], [place] [address] [phone]

## 2022-06-12 NOTE — Progress Notes (Signed)
Cath Change/ Replacement  Patient is present today for a catheter change due to neurogenic bladder.  48ml of water was removed from the balloon, a 16FR silicone foley cath was removed without difficulty. Patient notes some discomfort around the head of his penis due to silicone cath.  Patient was cleaned and prepped in a sterile fashion with betadine and 2% lidocaine jelly was instilled into the urethra. A 16 FR silastic foley cath was replaced into the bladder, no complications noted. Urine return was noted 14ml and urine was yellow in color. The balloon was filled with 72ml of sterile water. A night bag was attached for drainage. Patient was given proper instruction on catheter care.    Performed by: Debbe Bales, CMA  Follow up: RTC in 4 months for follow up, home health to change cath monthly

## 2022-06-12 NOTE — Patient Instructions (Signed)
Suprapubic Catheters Common uses Insertion Complications Duration Do's and don'ts Takeaway What is a suprapubic catheter?  A suprapubic catheter (sometimes called an [facility]) is a device that's inserted into your bladder to drain urine if you can't urinate on your own.  Normally, a catheter is inserted into your bladder through your urethra, the tube that you usually urinate out of. An SPC is inserted a couple of inches below your navel, or belly button, directly into your bladder, just above your pubic bone. This allows urine to be drained without having a tube going through your genital area.  SPCs are usually more comfortable than regular catheters because they aren't inserted through your urethra, which is full of sensitive tissue. Your doctor may use an [facility] if your urethra isn't able to safely hold a catheter.   What is a suprapubic catheter used for? An SPC drains urine directly out of your bladder if you're not able to urinate by yourself. Some conditions that may require you to use a catheter include:  urinary retention (can't urinate on your own) urinary incontinence (leakage) pelvic organ prolapse spinal injuries or trauma lower body paralysis multiple sclerosis (MS) Parkinson's disease benign prostatic hyperplasia (BPH) bladder cancer You may be given an SPC instead of a normal catheter for several reasons:  You're not as likely to get an infection. The tissue around your genitals isn't as likely to get damaged. Your urethra may be too damaged or sensitive to hold a catheter. You're healthy enough to stay sexually active even though you need a catheter. You've just had surgery on your bladder, urethra, uterus, penis, or other organ that's near your urethra. You spend most or all your time in a wheelchair, in which case an [facility] catheter is easier to take care of.  How is this device inserted? Suprapubic catheters are typically placed by interventional radiology under  imaging guidance.  This is performed under some light sedation and local anesthetic and takes about 10 minutes.  A small wire and tube would be placed in the bladder, and the catheter advanced over the wire, then the Foley catheter in your penis would be removed.   Are there any possible complications? SPC insertion is a short, safe procedure that usually has few complications. Before the insertion, your doctor may recommend taking antibiotics if you've had a heart valve replacement or are taking any blood thinners.  Possible minor complications of an SPC insertion include:  urine not draining properly urine leaking out of your catheter small amounts of blood in your urine You may be required to stay in the clinic or hospital if your doctor notices any complications that need immediate treatment, such as:  high fever abnormal abdominal pain infection discharge from the insertion area or urethra internal bleeding (hemorrhage) a hole in the bowel area (perforation) stones or pieces of tissue in your urine See your doctor as soon as possible if your catheter falls out at home, as it needs to be reinserted so that the opening doesn't close.   How long should this device stay inserted? An SPC usually stays inserted for four to eight weeks before it needs to be changed or removed. It may be removed sooner if your doctor believes that you're able to urinate on your own again.  To remove an [facility], your doctor:  Covers the area around your bladder with underpads so that urine doesn't get on you. Checks the insertion area for any swelling or irritation. Deflates the balloon at the end  of the catheter. Pinches the catheter right where it enters the skin and slowly pulls it out. Cleans and sterilizes the insertion area. Stitches the opening shut. What should I do or not do while this device is inserted? Do's Drink 8 to 12 glasses of water every day. Empty your urine bag several times a  day. Wash your hands whenever you handle your urine bag. Clean the insertion area with hot water twice a day. Turn your catheter when you clean it so that it doesn't stick to your bladder. Keep any dressings on the area until the insertion area is healed. Tape the catheter tube to your body so it doesn't slip or pull. Eat foods to help you avoid constipation, such as fiber, fruits, and vegetables. Continue any regular sexual activity. Don'ts Don't use any powders or creams around the insertion area. Don't take baths or immerse your insertion area in water for a long time. Don't shower without covering the area with a waterproof dressing. Don't reinsert the catheter yourself if it falls out.  The takeaway An SPC is a more comfortable alternative to a regular catheter and allows you to continue your normal daily activities without discomfort or pain. It's also easy to cover with clothing or dressing if you want to keep it private.  An SPC may only be used temporarily after surgery or treatment of certain conditions, but it may need to remain in place permanently in some cases. Talk to your doctor about how to take care of and change your catheter if you need to keep it in for a long period of tim

## 2022-06-13 ENCOUNTER — Encounter: Payer: Self-pay | Admitting: Oncology

## 2022-06-13 ENCOUNTER — Telehealth: Payer: Self-pay | Admitting: Internal Medicine

## 2022-06-13 NOTE — Telephone Encounter (Signed)
Home Health verbal orders Caller Name:[name] Agency Name: [facility]  Callback number: [phone]  Requesting OT/PT/Skilled nursing/Social Work/Speech: PT  Reason: continued strengthening,transfer  Frequency:2w9  Please forward to [facility] pool or providers CMA

## 2022-06-13 NOTE — Telephone Encounter (Signed)
Verbal orders given to [name]  

## 2022-06-24 ENCOUNTER — Encounter: Payer: Self-pay | Admitting: Oncology

## 2022-06-24 ENCOUNTER — Ambulatory Visit: Payer: Managed Care, Other (non HMO) | Admitting: Physician Assistant

## 2022-06-24 ENCOUNTER — Encounter: Payer: Self-pay | Admitting: Physician Assistant

## 2022-06-24 VITALS — BP 147/77 | HR 63 | Ht 70.0 in | Wt 179.0 lb

## 2022-06-24 DIAGNOSIS — R509 Fever, unspecified: Secondary | ICD-10-CM | POA: Diagnosis not present

## 2022-06-24 LAB — URINALYSIS, COMPLETE
Bilirubin, UA: NEGATIVE
Glucose, UA: NEGATIVE
Ketones, UA: NEGATIVE
Nitrite, UA: NEGATIVE
Protein,UA: NEGATIVE
Specific Gravity, UA: 1.01 (ref 1.005–1.030)
Urobilinogen, Ur: 0.2 mg/dL (ref 0.2–1.0)
pH, UA: 7 (ref 5.0–7.5)

## 2022-06-24 LAB — MICROSCOPIC EXAMINATION

## 2022-06-24 MED ORDER — AMOXICILLIN 875 MG PO TABS: 875.0000 mg | ORAL_TABLET | Freq: Two times a day (BID) | ORAL | 0 refills | Status: AC

## 2022-06-24 NOTE — Progress Notes (Signed)
06/24/2022 4:07 PM   Jason [name] Aug 02, 1955 [id number]  CC: Chief Complaint  Patient presents with   Recurrent UTI   HPI: Jason [name] is a 67 y.o. male with PMH metastatic prostate cancer managed by Dr. Cathie Hoops and chronic urinary retention managed with indwelling Foley catheter and exchanged by home health, most recently 12 days ago in our clinic, who presents today for evaluation of possible UTI.  He is accompanied today by his wife, who contributes to HPI.  Today they report low-grade fevers at home the past 2 nights, Tmax 99.9 F.  They have been responsive to Tylenol.  He denies abdominal pain, fever, chills, nausea, or vomiting.  No cough, shortness of breath, swelling, or new wounds.  Foley catheter was plugged for a urine specimen today.  In-office UA today positive for trace lysed blood and trace leukocytes; urine microscopy with 6-10 WBCs/HPF, 3-10 RBCs/HPF, and moderate bacteria.   PMH: Past Medical History:  Diagnosis Date   Allergy    Arthritis    osteoarthritis   GERD (gastroesophageal reflux disease)    History of kidney stones    Hypertension    Hypothyroidism    Palpitations 2022   Pneumonia    Stroke [facility])    Mini stroke - Aug 2023   Thyroid disease    Hyperthyroidism s/p radioactive iodine ablation    Surgical History: Past Surgical History:  Procedure Laterality Date   CARDIAC CATHETERIZATION  03/2000   HERNIA REPAIR  1961   umbilical   LEFT HEART CATH AND CORONARY ANGIOGRAPHY Left 03/20/2020   Procedure: LEFT HEART CATH AND CORONARY ANGIOGRAPHY;  Surgeon: Yvonne Kendall, MD;  Location: [facility] INVASIVE CV LAB;  Service: Cardiovascular;  Laterality: Left;   POSTERIOR CERVICAL FUSION/FORAMINOTOMY N/A 10/06/2021   Procedure: POSTERIOR CERVICAL FUSION/ FORAMINOTOMY LEVEL 3;  Surgeon: Venetia Night, MD;  Location: [facility] ORS;  Service: Neurosurgery;  Laterality: N/A;  will need monitoring   RADIOLOGY WITH ANESTHESIA Left 04/18/2021   Procedure:  MRI SHOULDER WITHOUT CONTRAST WITH ANESTHESIA;  Surgeon: Radiologist, Medication, MD;  Location: [facility] OR;  Service: Radiology;  Laterality: Left;   RADIOLOGY WITH ANESTHESIA N/A 10/03/2021   Procedure: MRI CERVICAL SPINE WITH ANESTHESIA;  Surgeon: Radiologist, Medication, MD;  Location: [facility] OR;  Service: Radiology;  Laterality: N/A;   RADIOLOGY WITH ANESTHESIA N/A 01/30/2022   Procedure: MRI CERVICAL SPINE WITH AND WITOHOUT CONTRAST WITH ANESTHESIA;  Surgeon: Radiologist, Medication, MD;  Location: [facility] OR;  Service: Radiology;  Laterality: N/A;   RADIOLOGY WITH ANESTHESIA N/A 05/22/2022   Procedure: MRI LUMBER SPINE AND CERVIC SPINE WITH AND WITHOUT CONTRAST;  Surgeon: Radiologist, Medication, MD;  Location: [facility] OR;  Service: Radiology;  Laterality: N/A;   REVERSE SHOULDER ARTHROPLASTY Left    ROTATOR CUFF REPAIR  11/2010   Dr Hyacinth Meeker   TONSILLECTOMY     TOTAL HIP ARTHROPLASTY Right 02/24/2009   TOTAL HIP ARTHROPLASTY Left 06/2016   Dr Odis Luster    Home Medications:  Allergies as of 06/24/2022   No Known Allergies      Medication List        Accurate as of June 24, 2022  4:07 PM. If you have any questions, ask your nurse or doctor.          acetaminophen 325 MG tablet Commonly known as: TYLENOL Take 1-2 tablets (325-650 mg total) by mouth every 4 (four) hours as needed for mild pain. What changed:  how much to take when to take this   amoxicillin 875  MG tablet Commonly known as: AMOXIL Take 1 tablet (875 mg total) by mouth every 12 (twelve) hours for 7 days. Started by: Carman Ching, PA-C   ascorbic acid 500 MG tablet Commonly known as: VITAMIN C Take 1 tablet (500 mg total) by mouth daily. What changed: how much to take   aspirin EC 81 MG tablet Take 1 tablet (81 mg total) by mouth daily. Swallow whole. What changed:  how much to take when to take this   atorvastatin 40 MG tablet Commonly known as: LIPITOR Take 1 tablet (40 mg total) by mouth daily.    cetirizine 10 MG tablet Commonly known as: ZYRTEC Take 10 mg by mouth at bedtime.   clonazePAM 0.5 MG tablet Commonly known as: KLONOPIN Take 1 tablet (0.5 mg total) by mouth 2 (two) times daily as needed for anxiety. What changed: when to take this   CRANBERRY PO Take 4,200 mg by mouth 2 (two) times daily. W/Vit C   enzalutamide 40 MG tablet Commonly known as: Xtandi Take 4 tablets (160 mg total) by mouth daily.   famotidine 20 MG tablet Commonly known as: PEPCID Take 1 tablet (20 mg total) by mouth daily. What changed: when to take this   FLUoxetine 20 MG capsule Commonly known as: PROZAC Take 1 capsule (20 mg total) by mouth at bedtime.   fluticasone 50 MCG/ACT nasal spray Commonly known as: FLONASE Place 1 spray into both nostrils daily.   levothyroxine 88 MCG tablet Commonly known as: SYNTHROID Take 1 tablet (88 mcg total) by mouth daily.   lisinopril 10 MG tablet Commonly known as: ZESTRIL Take 1 tablet (10 mg total) by mouth daily.   multivitamin tablet Take 1 tablet by mouth daily.   potassium chloride 10 MEQ tablet Commonly known as: KLOR-CON M Take 1 tablet (10 mEq total) by mouth at bedtime.   sennosides-docusate sodium 8.6-50 MG tablet Commonly known as: SENOKOT-S Take 2 tablets by mouth daily. What changed: when to take this   tamsulosin 0.4 MG Caps capsule Commonly known as: FLOMAX Take 1 capsule (0.4 mg total) by mouth daily after supper. What changed: when to take this        Allergies:  No Known Allergies  Family History: Family History  Problem Relation Age of Onset   Hypertension Mother    Stroke Mother    Gout Father    Heart attack Father 64   Heart disease Sister    Lung cancer Sister    Drug abuse Sister    Breast cancer Paternal Aunt    Lung cancer Maternal Grandmother    Colon cancer Neg Hx    Esophageal cancer Neg Hx    Rectal cancer Neg Hx    Stomach cancer Neg Hx     Social History:   reports that he has  never smoked. He has never been exposed to tobacco smoke. He has never used smokeless tobacco. He reports that he does not currently use alcohol. He reports that he does not use drugs.  Physical Exam: BP (!) 147/77   Pulse 63   Ht 5\' 10"  (1.778 m)   Wt 179 lb (81.2 kg)   BMI 25.68 kg/m   Constitutional:  Alert and oriented, no acute distress, nontoxic appearing HEENT: [place], AT Cardiovascular: No clubbing, cyanosis, or edema Respiratory: Normal respiratory effort, no increased work of breathing Skin: No rashes, bruises or suspicious lesions Neurologic: Grossly intact, no focal deficits, moving all 4 extremities Psychiatric: Normal mood and affect  Laboratory Data: Results for orders placed or performed in visit on 06/24/22  Microscopic Examination   Urine  Result Value Ref Range   WBC, UA 6-10 (A) 0 - 5 /hpf   RBC, Urine 3-10 (A) 0 - 2 /hpf   Epithelial Cells (non renal) 0-10 0 - 10 /hpf   Bacteria, UA Moderate (A) None seen/Few  Urinalysis, Complete  Result Value Ref Range   Specific Gravity, UA 1.010 1.005 - 1.030   pH, UA 7.0 5.0 - 7.5   Color, UA Yellow Yellow   Appearance Ur Clear Clear   Leukocytes,UA Trace (A) Negative   Protein,UA Negative Negative/Trace   Glucose, UA Negative Negative   Ketones, UA Negative Negative   RBC, UA Trace (A) Negative   Bilirubin, UA Negative Negative   Urobilinogen, Ur 0.2 0.2 - 1.0 mg/dL   Nitrite, UA Negative Negative   Microscopic Examination See below:    Assessment & Plan:   1. Fever, unspecified fever cause UA today is rather bland considering his chronic Foley status.  We discussed that he is not terribly symptomatic of UTI, however I cannot entirely rule this out.  Will treat with empiric amoxicillin, but we discussed following up with his PCP if his symptoms do not improve or if they worsen for consideration of alternative sources of his low-grade temperatures.  We also discussed seeking a higher level of care if he develops true  fevers greater than 100.8 F, especially if they are unresponsive to Tylenol. - Urinalysis, Complete - CULTURE, URINE COMPREHENSIVE - amoxicillin (AMOXIL) 875 MG tablet; Take 1 tablet (875 mg total) by mouth every 12 (twelve) hours for 7 days.  Dispense: 14 tablet; Refill: 0   Return if symptoms worsen or fail to improve.  Carman Ching, PA-C  [facility] [address], [address] [place], [place] [address] [phone]

## 2022-06-25 ENCOUNTER — Telehealth: Payer: Self-pay | Admitting: Internal Medicine

## 2022-06-25 NOTE — Telephone Encounter (Signed)
Home Health verbal orders Caller Name: Misty Stanley Agency Name: [facility]  Callback number: [phone] (secure line)  Requesting OT  Reason: Continue OT  Frequency: 2x a week for 7 weeks  Please forward to [facility] pool or providers CMA

## 2022-06-26 NOTE — Telephone Encounter (Signed)
Verbal orders given to Lisa.   

## 2022-06-27 ENCOUNTER — Encounter: Payer: Self-pay | Admitting: Physical Medicine and Rehabilitation

## 2022-06-27 ENCOUNTER — Encounter
Payer: Managed Care, Other (non HMO) | Attending: Physical Medicine and Rehabilitation | Admitting: Physical Medicine and Rehabilitation

## 2022-06-27 VITALS — BP 153/89 | HR 71 | Ht 70.0 in | Wt 179.0 lb

## 2022-06-27 DIAGNOSIS — R252 Cramp and spasm: Secondary | ICD-10-CM | POA: Diagnosis present

## 2022-06-27 DIAGNOSIS — G8250 Quadriplegia, unspecified: Secondary | ICD-10-CM | POA: Diagnosis present

## 2022-06-27 DIAGNOSIS — I63521 Cerebral infarction due to unspecified occlusion or stenosis of right anterior cerebral artery: Secondary | ICD-10-CM

## 2022-06-27 DIAGNOSIS — C61 Malignant neoplasm of prostate: Secondary | ICD-10-CM | POA: Diagnosis present

## 2022-06-27 NOTE — Progress Notes (Signed)
Subjective:    Patient ID: Jason [name], male    DOB: October 15, 1955, 67 y.o.   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   Doing well per pt.   Working on standing at sink- working on regularly- 4 minutes 6 seconds-  Long term goal 5 minutes- already reached STG of 3 minutes- reached that in a couple of days.    Spasticity is worse at night- not at during day.  Not taking anything for spasms- doesn't want to start meds- actually has a few spasms during day when lays him back in bed/w/c.   Doesn't wake him up from sleep-   Still has chronic foley- contemplating SPC.  Thinks might have UTI- temp 99.5- and face flushed- and using tylenol- Tm 99.9-    Went to Urology on Tuesday- pretty clean urine with foley.  Still gave Amoxicillin- until next week- for 1 week- BID.  More precautionary  Dr. Richardo Hanks- is urologist.    Has black spot on tongue- but likely due to Pepto-    No more baked beans- had a blow out.   Senna at 11:45 am; and bowel program at 7:30pm  On sacrum/coccyx- 2 inch area- was red but has gone away/redness- it's bumpy- almost like a callus. H/H suggested zinc oxide and covered with dressing- and not taping.    Still gets tingling in hands- "hard to fish".  Now on wife's insurance.   Gets weepy really fast still.    Pain Inventory Average Pain 2 Pain Right Now 0 My pain is sharp and dull  LOCATION OF PAIN  shoulder, buttocks  BOWEL Number of stools per week: 7 Oral laxative use Yes  Type of laxative senna Enema or suppository use Yes  History of colostomy No  Incontinent Yes   BLADDER Foley In and out cath, frequency . Able to self cath  . Bladder incontinence Yes  Frequent urination No  Leakage with  coughing No  Difficulty starting stream No  Incomplete bladder emptying No    Mobility ability to climb steps?  no do you drive?  no use a wheelchair needs help with transfers  Function retired I need assistance with the following:  dressing, bathing, toileting, meal prep, household duties, and shopping  Neuro/Psych bladder control problems bowel control problems weakness tingling trouble walking spasms  Prior Studies Any changes since last visit?  yes  Physicians involved in your care Any changes since last visit?  no   Family History  Problem Relation Age of Onset   Hypertension Mother    Stroke Mother    Gout Father    Heart attack Father 38   Heart disease Sister    Lung cancer Sister    Drug abuse Sister    Breast cancer Paternal Aunt    Lung cancer Maternal Grandmother    Colon cancer Neg Hx    Esophageal cancer Neg Hx    Rectal cancer Neg Hx    Stomach cancer Neg Hx    Social History   Socioeconomic History   Marital status: Married    Spouse name: [name]    Number of children: 0   Years of education: Not on file   Highest education level: Not on file  Occupational History   Occupation: Warden/ranger: [employer]  Tobacco Use   Smoking status: Never    Passive exposure: Never   Smokeless tobacco: Never  Vaping Use   Vaping Use: Never used  Substance and Sexual Activity   Alcohol use: Not Currently    Comment: Alcohol once every few weeks/months   Drug use: No   Sexual activity: Not Currently  Other Topics Concern   Not on file  Social History Narrative   Lives at home with spouse.    Social Determinants of Health   Financial Resource Strain: Not on file  Food Insecurity: Not on file  Transportation Needs: Not on file  Physical Activity: Not on file  Stress: Not on file  Social Connections: Not on file   Past Surgical History:  Procedure Laterality Date   CARDIAC CATHETERIZATION  03/2000   HERNIA REPAIR  1961    umbilical   LEFT HEART CATH AND CORONARY ANGIOGRAPHY Left 03/20/2020   Procedure: LEFT HEART CATH AND CORONARY ANGIOGRAPHY;  Surgeon: Yvonne Kendall, MD;  Location: [facility];  Service: Cardiovascular;  Laterality: Left;   POSTERIOR CERVICAL FUSION/FORAMINOTOMY N/A 10/06/2021   Procedure: POSTERIOR CERVICAL FUSION/ FORAMINOTOMY LEVEL 3;  Surgeon: Venetia Night, MD;  Location: [facility];  Service: Neurosurgery;  Laterality: N/A;  will need monitoring   RADIOLOGY WITH ANESTHESIA Left 04/18/2021   Procedure: MRI SHOULDER WITHOUT CONTRAST WITH ANESTHESIA;  Surgeon: Radiologist, Medication, MD;  Location: [facility];  Service: Radiology;  Laterality: Left;   RADIOLOGY WITH ANESTHESIA N/A 10/03/2021   Procedure: MRI CERVICAL SPINE WITH ANESTHESIA;  Surgeon: Radiologist, Medication, MD;  Location: [facility];  Service: Radiology;  Laterality: N/A;   RADIOLOGY WITH ANESTHESIA N/A 01/30/2022   Procedure: MRI CERVICAL SPINE WITH AND WITOHOUT CONTRAST WITH ANESTHESIA;  Surgeon: Radiologist, Medication, MD;  Location: [facility];  Service: Radiology;  Laterality: N/A;   RADIOLOGY WITH ANESTHESIA N/A 05/22/2022   Procedure: MRI LUMBER SPINE AND CERVIC SPINE WITH AND WITHOUT CONTRAST;  Surgeon: Radiologist, Medication, MD;  Location: [facility];  Service: Radiology;  Laterality: N/A;   REVERSE SHOULDER ARTHROPLASTY Left    ROTATOR CUFF REPAIR  11/2010   Dr Hyacinth Meeker   TONSILLECTOMY     TOTAL HIP ARTHROPLASTY Right 02/24/2009   TOTAL HIP ARTHROPLASTY Left 06/2016   Dr Odis Luster   Past Medical History:  Diagnosis Date   Allergy    Arthritis    osteoarthritis   GERD (gastroesophageal reflux disease)    History of kidney stones    Hypertension    Hypothyroidism    Palpitations 2022   Pneumonia    Stroke [facility])    Mini stroke - Aug 2023   Thyroid disease    Hyperthyroidism s/p radioactive iodine ablation   BP (!) 153/89   Pulse 71   Ht 5\' 10"  (1.778 m)   Wt 179 lb (81.2 kg)   SpO2 98%   BMI 25.68 kg/m    Opioid Risk Score:   Fall Risk Score:  `1  Depression screen PHQ 2/9     12/02/2021    1:20 PM 10/15/2020    3:41 PM 10/10/2019    3:10 PM 10/05/2018    7:54 AM 08/28/2017    8:28 AM  Depression screen PHQ 2/9  Decreased Interest 0 0 0 0 0  Down, Depressed, Hopeless 1 0 0 0 0  PHQ - 2 Score 1 0 0 0 0  Altered sleeping 0  Tired, decreased energy 1      Change in appetite 0      Feeling bad or failure about yourself  0      Trouble concentrating 0      Moving slowly or fidgety/restless 0      Suicidal thoughts 0      PHQ-9 Score 2          Review of Systems  Musculoskeletal:  Positive for back pain and gait problem.       Spasms  Neurological:  Positive for weakness.  All other systems reviewed and are negative.     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    Discussed colonization vs UTI   and using antibiotics frequently can cause resistance faster if uses frequently.   2. Pepto can cause cause black spot on tongue.    3. Con't bowel program and foley.    4.  Do pressure relief every 20-30 minutes in w/c.   5. W/c ROHO cushion has appropriate amount of air.   6. Agree with zinc oxide- and gauze- since gets soiled- cannot tape it, since tears skin.   7. Con't H/H- happy to redo more therapy PT and OT  8.  F/U in 3 months- double SCI   I spent a total of  34   minutes on total care today- >50% coordination of care- due to   as detailed above focused on colonization vs  UTI

## 2022-06-27 NOTE — Patient Instructions (Addendum)
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    Discussed colonization vs UTI   and using antibiotics frequently can cause resistance faster if uses frequently.  If has cloudy or dark urine- if water fixes the problems, it's likely not a UTI  2. Pepto can cause cause black spot on tongue.    3. Con't bowel program and foley.    4.  Do pressure relief every 20-30 minutes in w/c.   5. W/c ROHO cushion has appropriate amount of air.   6. Agree with zinc oxide- and gauze- since gets soiled- cannot tape it, since tears skin.   7. Con't H/H- happy to redo more therapy PT and OT  8.  F/U in 3 months- double SCI

## 2022-06-28 LAB — CULTURE, URINE COMPREHENSIVE

## 2022-07-01 ENCOUNTER — Encounter: Payer: Self-pay | Admitting: Oncology

## 2022-07-02 ENCOUNTER — Encounter: Payer: Self-pay | Admitting: Internal Medicine

## 2022-07-02 ENCOUNTER — Ambulatory Visit (INDEPENDENT_AMBULATORY_CARE_PROVIDER_SITE_OTHER): Payer: Managed Care, Other (non HMO) | Admitting: Internal Medicine

## 2022-07-02 VITALS — BP 136/84 | HR 70 | Resp 14

## 2022-07-02 DIAGNOSIS — G8220 Paraplegia, unspecified: Secondary | ICD-10-CM | POA: Diagnosis not present

## 2022-07-02 DIAGNOSIS — N319 Neuromuscular dysfunction of bladder, unspecified: Secondary | ICD-10-CM

## 2022-07-02 DIAGNOSIS — F39 Unspecified mood [affective] disorder: Secondary | ICD-10-CM

## 2022-07-02 DIAGNOSIS — C7940 Secondary malignant neoplasm of unspecified part of nervous system: Secondary | ICD-10-CM

## 2022-07-02 DIAGNOSIS — L98421 Non-pressure chronic ulcer of back limited to breakdown of skin: Secondary | ICD-10-CM

## 2022-07-02 DIAGNOSIS — C61 Malignant neoplasm of prostate: Secondary | ICD-10-CM

## 2022-07-02 DIAGNOSIS — I10 Essential (primary) hypertension: Secondary | ICD-10-CM

## 2022-07-02 DIAGNOSIS — I471 Supraventricular tachycardia, unspecified: Secondary | ICD-10-CM | POA: Diagnosis not present

## 2022-07-02 DIAGNOSIS — L98429 Non-pressure chronic ulcer of back with unspecified severity: Secondary | ICD-10-CM | POA: Insufficient documentation

## 2022-07-02 DIAGNOSIS — K592 Neurogenic bowel, not elsewhere classified: Secondary | ICD-10-CM

## 2022-07-02 NOTE — Assessment & Plan Note (Signed)
Superfiicial They pumped up the chair pillow Using zinc oxide now

## 2022-07-02 NOTE — Assessment & Plan Note (Signed)
BP Readings from Last 3 Encounters:  07/02/22 136/84  06/27/22 (!) 153/89  06/24/22 (!) 147/77   Has been elevated at times---but often situational High risk for orthostasis Would not recommend increasing lisinopril

## 2022-07-02 NOTE — Assessment & Plan Note (Signed)
Marked response to xtandi PSA under 5

## 2022-07-02 NOTE — Assessment & Plan Note (Signed)
Related to disability Fluoxetine may separately help with rehab Uses clonazepam bid---for muscle spasm mostly

## 2022-07-02 NOTE — Assessment & Plan Note (Signed)
Doing well with bowel regimen

## 2022-07-02 NOTE — Assessment & Plan Note (Addendum)
Still with Foley Considering suprapubic--seems to be a good idea

## 2022-07-02 NOTE — Assessment & Plan Note (Signed)
Still limited use of legs--needs lift for transfers, power chair, etc Has been able to stand for a few minutes with help Continuing with therapy

## 2022-07-02 NOTE — Assessment & Plan Note (Signed)
No symptoms of recurrence Hasn't needed metoporolo

## 2022-07-02 NOTE — Progress Notes (Signed)
Subjective:   Oing  Patient ID: Jason [name], male    DOB: [birth year], 67 y.o.   MRN: [id number]  HPI Home visit for follow up of paraplegia and prostate cancer Wife is here as usual  Doing  okay Last PSA under 5 On xtandi  Doing well with therapy Now able to stand at the sink---needs help getting up completely Made it 45 seconds--then almost 5 minutes today  Hand strength is better Uses 5# weights for exercises Able to shave, brush teeth, etc Some limitation due to height of chair  Had some concerns about possible urine infection Noticed low grade fever---but just in evening Got amoxil for a week just in case---from urologist Still considering suprapubic catheter  Continues on fluoxetine Rx by rehab doctor  Wife still uses lift for transfers--she still prefers help with this Jason [name] still support wife by coming to spend time with him Wife can go out----has phone available  Bowel regimen is effective Rectal stim and suppository nightly--and senna Goes at night and wife can clean him in the morning  Current Outpatient Medications on File Prior to Visit  Medication Sig Dispense Refill   acetaminophen (TYLENOL) 325 MG tablet Take 1-2 tablets (325-650 mg total) by mouth every 4 (four) hours as needed for mild pain. (Patient taking differently: Take 1,000 mg by mouth 2 (two) times daily.)     ascorbic acid (VITAMIN C) 500 MG tablet Take 1 tablet (500 mg total) by mouth daily. (Patient taking differently: Take 1,000 mg by mouth daily.) 30 tablet 0   aspirin EC 81 MG tablet Take 1 tablet (81 mg total) by mouth daily. Swallow whole. (Patient taking differently: Take 162 mg by mouth in the morning. Swallow whole.) 30 tablet 12   atorvastatin (LIPITOR) 40 MG tablet Take 1 tablet (40 mg total) by mouth daily. 90 tablet 3   cetirizine (ZYRTEC) 10 MG tablet Take 10 mg by mouth at bedtime.     clonazePAM (KLONOPIN) 0.5 MG tablet Take 1 tablet (0.5 mg total) by mouth 2  (two) times daily as needed for anxiety. (Patient taking differently: Take 0.5 mg by mouth 2 (two) times daily.) 180 tablet 0   CRANBERRY PO Take 4,200 mg by mouth 2 (two) times daily. W/Vit C     enzalutamide (XTANDI) 40 MG tablet Take 4 tablets (160 mg total) by mouth daily. 60 tablet 4   famotidine (PEPCID) 20 MG tablet Take 1 tablet (20 mg total) by mouth daily. (Patient taking differently: Take 20 mg by mouth at bedtime.) 30 tablet 0   FLUoxetine (PROZAC) 20 MG capsule Take 1 capsule (20 mg total) by mouth at bedtime. 90 capsule 3   fluticasone (FLONASE) 50 MCG/ACT nasal spray Place 1 spray into both nostrils daily. 48 g 3   levothyroxine (SYNTHROID) 88 MCG tablet Take 1 tablet (88 mcg total) by mouth daily. 90 tablet 3   lisinopril (ZESTRIL) 10 MG tablet Take 1 tablet (10 mg total) by mouth daily. 90 tablet 3   Multiple Vitamin (MULTIVITAMIN) tablet Take 1 tablet by mouth daily.     potassium chloride (KLOR-CON M) 10 MEQ tablet Take 1 tablet (10 mEq total) by mouth at bedtime. 90 tablet 3   sennosides-docusate sodium (SENOKOT-S) 8.6-50 MG tablet Take 2 tablets by mouth daily. (Patient taking differently: Take 2 tablets by mouth daily with lunch.) 60 tablet 11   tamsulosin (FLOMAX) 0.4 MG CAPS capsule Take 1 capsule (0.4 mg total) by mouth daily after supper. (Patient  taking differently: Take 0.4 mg by mouth in the morning.) 90 capsule 3   No current facility-administered medications on file prior to visit.    No Known Allergies  Past Medical History:  Diagnosis Date   Allergy    Arthritis    osteoarthritis   GERD (gastroesophageal reflux disease)    History of kidney stones    Hypertension    Hypothyroidism    Palpitations 2022   Pneumonia    Stroke [facility])    Mini stroke - Aug 2023   Thyroid disease    Hyperthyroidism s/p radioactive iodine ablation    Past Surgical History:  Procedure Laterality Date   CARDIAC CATHETERIZATION  03/2000   HERNIA REPAIR  1961   umbilical    LEFT HEART CATH AND CORONARY ANGIOGRAPHY Left 03/20/2020   Procedure: LEFT HEART CATH AND CORONARY ANGIOGRAPHY;  Surgeon: Yvonne Kendall, MD;  Location: [facility];  Service: Cardiovascular;  Laterality: Left;   POSTERIOR CERVICAL FUSION/FORAMINOTOMY N/A 10/06/2021   Procedure: POSTERIOR CERVICAL FUSION/ FORAMINOTOMY LEVEL 3;  Surgeon: Venetia Night, MD;  Location: [facility] ORS;  Service: Neurosurgery;  Laterality: N/A;  will need monitoring   RADIOLOGY WITH ANESTHESIA Left 04/18/2021   Procedure: MRI SHOULDER WITHOUT CONTRAST WITH ANESTHESIA;  Surgeon: Radiologist, Medication, MD;  Location: [facility];  Service: Radiology;  Laterality: Left;   RADIOLOGY WITH ANESTHESIA N/A 10/03/2021   Procedure: MRI CERVICAL SPINE WITH ANESTHESIA;  Surgeon: Radiologist, Medication, MD;  Location: [facility];  Service: Radiology;  Laterality: N/A;   RADIOLOGY WITH ANESTHESIA N/A 01/30/2022   Procedure: MRI CERVICAL SPINE WITH AND WITOHOUT CONTRAST WITH ANESTHESIA;  Surgeon: Radiologist, Medication, MD;  Location: [facility];  Service: Radiology;  Laterality: N/A;   RADIOLOGY WITH ANESTHESIA N/A 05/22/2022   Procedure: MRI LUMBER SPINE AND CERVIC SPINE WITH AND WITHOUT CONTRAST;  Surgeon: Radiologist, Medication, MD;  Location: [facility];  Service: Radiology;  Laterality: N/A;   REVERSE SHOULDER ARTHROPLASTY Left    ROTATOR CUFF REPAIR  11/2010   Dr Hyacinth Meeker   TONSILLECTOMY     TOTAL HIP ARTHROPLASTY Right 02/24/2009   TOTAL HIP ARTHROPLASTY Left 06/2016   Dr Odis Luster    Family History  Problem Relation Age of Onset   Hypertension Mother    Stroke Mother    Gout Father    Heart attack Father 32   Heart disease Sister    Lung cancer Sister    Drug abuse Sister    Breast cancer Paternal Aunt    Lung cancer Maternal Grandmother    Colon cancer Neg Hx    Esophageal cancer Neg Hx    Rectal cancer Neg Hx    Stomach cancer Neg Hx     Social History   Socioeconomic History   Marital status: Married    Spouse  name: [name]    Number of children: 0   Years of education: Not on file   Highest education level: Bachelor's degree (e.g., BA, AB, BS)  Occupational History   Occupation: Warden/ranger: [employer]  Tobacco Use   Smoking status: Never    Passive exposure: Never   Smokeless tobacco: Never  Vaping Use   Vaping Use: Never used  Substance and Sexual Activity   Alcohol use: Not Currently    Comment: Alcohol once every few weeks/months   Drug use: No   Sexual activity: Not Currently  Other Topics Concern   Not on file  Social History Narrative   Lives at home with  spouse.    Social Determinants of Health   Financial Resource Strain: Low Risk  (06/30/2022)   Overall Financial Resource Strain (CARDIA)    Difficulty of Paying Living Expenses: Not hard at all  Food Insecurity: No Food Insecurity (06/30/2022)   Hunger Vital Sign    Worried About Running Out of Food in the Last Year: Never true    Ran Out of Food in the Last Year: Never true  Transportation Needs: No Transportation Needs (06/30/2022)   PRAPARE - Administrator, Civil Service (Medical): No    Lack of Transportation (Non-Medical): No  Physical Activity: Unknown (06/30/2022)   Exercise Vital Sign    Days of Exercise per Week: 0 days    Minutes of Exercise per Session: Not on file  Stress: No Stress Concern Present (06/30/2022)   [employer] of Occupational Health - Occupational Stress Questionnaire    Feeling of Stress : Only a little  Social Connections: Moderately Integrated (06/30/2022)   Social Connection and Isolation Panel [NHANES]    Frequency of Communication with Friends and Family: Three times a week    Frequency of Social Gatherings with Friends and Family: More than three times a week    Attends Religious Services: Never    Database administrator or Organizations: Yes    Attends Engineer, structural: 1 to 4 times per year    Marital Status: Married  Catering manager  Violence: Not on file   Review of Systems Takes tylenol for shoulder pain Sleeps great Appetite is very good Weight seems stable--hasn't been able to weigh Has superficial ulcer in sacrum--they have used zinc oxide and pumped up his seat cushion--only with very small open area now     Objective:   Physical Exam Constitutional:      Appearance: Normal appearance.  Cardiovascular:     Rate and Rhythm: Normal rate and regular rhythm.     Heart sounds: No murmur heard.    No gallop.  Pulmonary:     Effort: Pulmonary effort is normal.     Breath sounds: Normal breath sounds. No wheezing or rales.  Abdominal:     Palpations: Abdomen is soft.     Tenderness: There is no abdominal tenderness.  Musculoskeletal:     Cervical back: Neck supple.     Right lower leg: No edema.     Left lower leg: No edema.  Lymphadenopathy:     Cervical: No cervical adenopathy.  Neurological:     Mental Status: He is alert.     Comments: Left leg 3/5, right leg 2/5 strength Arms 4/5  Psychiatric:        Mood and Affect: Mood normal.        Behavior: Behavior normal.            Assessment & Plan:

## 2022-07-04 ENCOUNTER — Inpatient Hospital Stay: Payer: Managed Care, Other (non HMO) | Attending: Anatomic Pathology & Clinical Pathology

## 2022-07-04 ENCOUNTER — Encounter: Payer: Self-pay | Admitting: Oncology

## 2022-07-04 ENCOUNTER — Inpatient Hospital Stay (HOSPITAL_BASED_OUTPATIENT_CLINIC_OR_DEPARTMENT_OTHER): Payer: Managed Care, Other (non HMO) | Admitting: Oncology

## 2022-07-04 VITALS — BP 135/82 | HR 64 | Temp 96.9°F | Resp 18

## 2022-07-04 DIAGNOSIS — Z803 Family history of malignant neoplasm of breast: Secondary | ICD-10-CM | POA: Insufficient documentation

## 2022-07-04 DIAGNOSIS — Z823 Family history of stroke: Secondary | ICD-10-CM | POA: Diagnosis not present

## 2022-07-04 DIAGNOSIS — Z8673 Personal history of transient ischemic attack (TIA), and cerebral infarction without residual deficits: Secondary | ICD-10-CM | POA: Insufficient documentation

## 2022-07-04 DIAGNOSIS — R531 Weakness: Secondary | ICD-10-CM | POA: Diagnosis not present

## 2022-07-04 DIAGNOSIS — Z79818 Long term (current) use of other agents affecting estrogen receptors and estrogen levels: Secondary | ICD-10-CM | POA: Diagnosis not present

## 2022-07-04 DIAGNOSIS — Z801 Family history of malignant neoplasm of trachea, bronchus and lung: Secondary | ICD-10-CM | POA: Insufficient documentation

## 2022-07-04 DIAGNOSIS — C7951 Secondary malignant neoplasm of bone: Secondary | ICD-10-CM | POA: Insufficient documentation

## 2022-07-04 DIAGNOSIS — I10 Essential (primary) hypertension: Secondary | ICD-10-CM | POA: Insufficient documentation

## 2022-07-04 DIAGNOSIS — Z814 Family history of other substance abuse and dependence: Secondary | ICD-10-CM | POA: Insufficient documentation

## 2022-07-04 DIAGNOSIS — R5383 Other fatigue: Secondary | ICD-10-CM | POA: Insufficient documentation

## 2022-07-04 DIAGNOSIS — I700 Atherosclerosis of aorta: Secondary | ICD-10-CM | POA: Insufficient documentation

## 2022-07-04 DIAGNOSIS — C61 Malignant neoplasm of prostate: Secondary | ICD-10-CM

## 2022-07-04 DIAGNOSIS — K219 Gastro-esophageal reflux disease without esophagitis: Secondary | ICD-10-CM | POA: Diagnosis not present

## 2022-07-04 DIAGNOSIS — Z86718 Personal history of other venous thrombosis and embolism: Secondary | ICD-10-CM | POA: Diagnosis not present

## 2022-07-04 DIAGNOSIS — Z8249 Family history of ischemic heart disease and other diseases of the circulatory system: Secondary | ICD-10-CM | POA: Insufficient documentation

## 2022-07-04 DIAGNOSIS — Z8349 Family history of other endocrine, nutritional and metabolic diseases: Secondary | ICD-10-CM | POA: Insufficient documentation

## 2022-07-04 DIAGNOSIS — Z79899 Other long term (current) drug therapy: Secondary | ICD-10-CM | POA: Insufficient documentation

## 2022-07-04 DIAGNOSIS — C7940 Secondary malignant neoplasm of unspecified part of nervous system: Secondary | ICD-10-CM | POA: Insufficient documentation

## 2022-07-04 DIAGNOSIS — Z87442 Personal history of urinary calculi: Secondary | ICD-10-CM | POA: Insufficient documentation

## 2022-07-04 LAB — CBC WITH DIFFERENTIAL/PLATELET
Abs Immature Granulocytes: 0.02 10*3/uL (ref 0.00–0.07)
Basophils Absolute: 0 10*3/uL (ref 0.0–0.1)
Basophils Relative: 1 %
Eosinophils Absolute: 0.1 10*3/uL (ref 0.0–0.5)
Eosinophils Relative: 2 %
HCT: 41.4 % (ref 39.0–52.0)
Hemoglobin: 13.9 g/dL (ref 13.0–17.0)
Immature Granulocytes: 0 %
Lymphocytes Relative: 16 %
Lymphs Abs: 1 10*3/uL (ref 0.7–4.0)
MCH: 32.7 pg (ref 26.0–34.0)
MCHC: 33.6 g/dL (ref 30.0–36.0)
MCV: 97.4 fL (ref 80.0–100.0)
Monocytes Absolute: 0.3 10*3/uL (ref 0.1–1.0)
Monocytes Relative: 5 %
Neutro Abs: 5 10*3/uL (ref 1.7–7.7)
Neutrophils Relative %: 76 %
Platelets: 277 10*3/uL (ref 150–400)
RBC: 4.25 MIL/uL (ref 4.22–5.81)
RDW: 14.4 % (ref 11.5–15.5)
WBC: 6.5 10*3/uL (ref 4.0–10.5)
nRBC: 0 % (ref 0.0–0.2)

## 2022-07-04 LAB — COMPREHENSIVE METABOLIC PANEL
ALT: 14 U/L (ref 0–44)
AST: 23 U/L (ref 15–41)
Albumin: 3.6 g/dL (ref 3.5–5.0)
Alkaline Phosphatase: 69 U/L (ref 38–126)
Anion gap: 11 (ref 5–15)
BUN: 8 mg/dL (ref 8–23)
CO2: 27 mmol/L (ref 22–32)
Calcium: 8.8 mg/dL — ABNORMAL LOW (ref 8.9–10.3)
Chloride: 97 mmol/L — ABNORMAL LOW (ref 98–111)
Creatinine, Ser: 0.47 mg/dL — ABNORMAL LOW (ref 0.61–1.24)
GFR, Estimated: >60 mL/min (ref >60)
Glucose, Bld: 89 mg/dL (ref 70–99)
Potassium: 3.5 mmol/L (ref 3.5–5.1)
Sodium: 135 mmol/L (ref 135–145)
Total Bilirubin: 0.9 mg/dL (ref 0.3–1.2)
Total Protein: 6.5 g/dL (ref 6.5–8.1)

## 2022-07-04 LAB — PSA: Prostatic Specific Antigen: 3.52 ng/mL (ref 0.00–4.00)

## 2022-07-04 MED ORDER — ENZALUTAMIDE 40 MG PO TABS
160.0000 mg | ORAL_TABLET | Freq: Every day | ORAL | 2 refills | Status: DC
Start: 2022-07-04 — End: 2022-10-01

## 2022-07-04 NOTE — Assessment & Plan Note (Signed)
Provoked, off Eliquis due to interaction with Xtandi Continue Aspirin 162 mg daily for prophylaxis. 

## 2022-07-04 NOTE — Assessment & Plan Note (Signed)
On Eligard 45mg Q6 months. Next due Mid June 2024 

## 2022-07-04 NOTE — Assessment & Plan Note (Addendum)
Metastatic castration sensitive prostate cancer, with CNS involvement. Currently on chemoprevention therapy with Eligard.  Status post palliative radiation to spine. PMSA PET scan showed partial response.  Labs are reviewed and discussed with patient.  He tolerates Xtandi 160 mg daily, continue current regimen. Fatigue/brain fog could be due to CNS side effects. Manageable.

## 2022-07-04 NOTE — Progress Notes (Signed)
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 Currently on chemoprevention therapy with Eligard.  Status post palliative radiation to spine. PMSA PET scan showed partial response.  Labs are reviewed and discussed with patient.  He tolerates Xtandi 160 mg daily, continue current regimen. Fatigue/brain fog could be due to CNS side effects. Manageable.   History of deep vein thrombosis (DVT) of lower extremity Provoked, off Eliquis due to interaction with Xtandi Continue Aspirin 162 mg daily for prophylaxis.  Androgen deprivation therapy On Eligard 45mg  Q6 months. Next due Mid June 2024    Orders Placed This Encounter  Procedures   CBC with Differential (Cancer Center Only)    Standing Status:   Future    Standing Expiration Date:   07/04/2023   CMP (Cancer Center only)    Standing Status:   Future    Standing Expiration Date:   07/04/2023   PSA    Standing Status:   Future    Standing Expiration Date:   07/04/2023   Follow up 3 months.   All questions were answered. The patient knows to call the clinic with any problems, questions or concerns.  [name], MD, PhD [facility] Hematology Oncology 07/04/2022        HISTORY OF PRESENTING ILLNESS:  Jason [name] 67 y.o. male presents to establish care for metastatic prostate cancer I have reviewed his chart and materials related to his cancer extensively and collaborated history with the patient. 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  Associated epidural disease greater at the C6 where there is marked canal stenosis without cord compression.  Left greater than right foraminal effacement due to the extraosseous disease.   10/03/2021 Imaging   MRI left shoulder showed High-riding humeral head with massive full-thickness tear of the entire supraspinatus tendon and the anterior 50% of the infraspinatus tendon. Additional partial-thickness articular sided tearing of the posterior infraspinatus. Moderate to high-grade supraspinatus and infraspinatus muscle atrophy suggests these tears  are chronic. Partial-thickness tearing of the superior greater than inferior  aspects of the subscapularis tendon footprint. Mild subscapularis muscle atrophy.  Moderate degenerative changes of the acromioclavicular joint. Moderate glenohumeral cartilage degenerative changes.   10/05/2021 Imaging   CT chest abdomen pelvis with contrast showed  Moderate severity retroperitoneal and pelvic lymphadenopathy,consistent with metastatic disease. Small lytic areas at the levels of L4 and S1, with diffusely sclerotic changes involving the second and fourth right ribs, sacrum and left iliac bone. 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  Sigmoid diverticulosis. Aortic atherosclerosis   10/05/2021 Imaging   CT cervical spine without contrast Extensive heterogeneous sclerotic lesions throughout the cervical spine compatible with metastatic disease to the bone.  Mixed sclerotic and lytic changes in  the posterior elements at C5 and  C6.Extraosseous tumor at C5 and C6 is better appreciated on the MRI scan.   10/05/2021 Tumor Marker   PSA >1500   10/06/2021 Cancer Staging   Staging form: Prostate, AJCC 8th Edition - Clinical stage from 10/06/2021: Stage IVB (cN1, pM1c, PSA: 1500) - Signed by [name], MD on 12/04/2021 Stage prefix: Initial diagnosis Prostate specific antigen (PSA) range: 20 or greater   10/08/2021 Imaging   MRI thoracic spine and the lumbar spine with and without contrast 1. 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 2. 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 3. Additional epidural tumor along the posterior endplates at T9,T11, and T12 without high-grade spinal canal stenosis or cord compression. 4. Extensive epidural tumor in the lumbar spine is greatest in thickness at L1 without high-grade spinal canal stenosis at this level; however, superimposed on pre-existing degenerative changes lower in the lumbar spine results in severe spinal canal stenosis with cauda equina nerve root compression at L3-L4 and L4-L5. Multilevel neural foraminal stenosis is detailed above. 5. Postsurgical changes reflecting C6 laminectomy unchanged spinal canal stenosis compared to the postoperative study from 1 day prior.No definite cord signal abnormality. 6. Extramedullary fluid collection along the dorsal aspect of the cord beginning at T9-T10 extending into the lumbar spine is likely subdural in location but is of uncertain etiology; evolving blood products not excluded. The collection anteriorly displaces the cord and cauda equina nerve roots without frank cord compression or signal abnormality. There is no peripheral enhancement to suggest abscess. 7. Prevertebral edema in the cervical spine which may be postsurgical in  nature. 8. New bilateral lower lobe consolidations may reflect atelectasis or developing pneumonia/aspiration.   10/08/2021 Imaging   MRI brain with and without contrast showed 1. 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 There is intracranial extension of tumor along the right frontal convexity measuring up to 5 mm in thickness without mass effect on the underlying brain parenchyma or midline shift. Extracranial extension of tumor in this location measures up to approximately 3 mm. 2. Additional extracranial extension of tumor into the scalp along the vertex measuring up to 7 mm in thickness. 3. Small focus of diffusion restriction in the right parietal cortex most in keeping with a small acute infarct. 4. No evidence of parenchymal metastatic disease.   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 10/06/2021, patient underwent C6 cervical laminectomy and resection of tumor.  Pathology came back positive for metastatic prostate cancer. 8/14 developed significant bilateral upper and lower extremity weakness with hypotension requiring transfer to the ICU, pressors, steroid, respiratory failure on mechanical ventilation. Later patient was stabilized and extubated.  8/16 Firmagon loading dose 240mg   9/15 Eligard 22.5mg  given inpatient during his rehab admission.     10/2021 -  Radiation Therapy   Palliative radiation to spine.    12/04/2021 Tumor Marker   PSA 67.09   01/13/2022 Tumor Marker   PSA 43.95   01/23/2022 -  Chemotherapy   Started on Xtandi 160mg  daily.     INTERVAL HISTORY JL MANDLER is a 67 y.o. male who has above history reviewed by me today presents for follow up visit for metastatic prostate cancer. Patient was accompanied  by wife.  Patient has chronic bilateral lower extremity extremity weakness, generalized left  upper extremity weakness.  Marland Kitchen  He follows up with neurology. He takes [place] 160mg  daily.overall tolerates well, + fatigued, brain fog, unchanged.  No new complaints. He continues physical therapy and has made some progress.   MEDICAL HISTORY:  Past Medical History:  Diagnosis Date   Allergy    Arthritis    osteoarthritis   GERD (gastroesophageal reflux disease)    History of kidney stones    Hypertension    Hypothyroidism    Palpitations 2022   Pneumonia    Stroke [facility])    Mini stroke - Aug 2023   Thyroid disease    Hyperthyroidism s/p radioactive iodine ablation    SURGICAL HISTORY: Past Surgical History:  Procedure Laterality Date   CARDIAC CATHETERIZATION  03/2000   HERNIA REPAIR  1961   umbilical   LEFT HEART CATH AND CORONARY ANGIOGRAPHY Left 03/20/2020   Procedure: LEFT HEART CATH AND CORONARY ANGIOGRAPHY;  Surgeon: Yvonne Kendall, MD;  Location: [facility] INVASIVE CV LAB;  Service: Cardiovascular;  Laterality: Left;   POSTERIOR CERVICAL FUSION/FORAMINOTOMY N/A 10/06/2021   Procedure: POSTERIOR CERVICAL FUSION/ FORAMINOTOMY LEVEL 3;  Surgeon: Venetia Night, MD;  Location: [facility] ORS;  Service: Neurosurgery;  Laterality: N/A;  will need monitoring   RADIOLOGY WITH ANESTHESIA Left 04/18/2021   Procedure: MRI SHOULDER WITHOUT CONTRAST WITH ANESTHESIA;  Surgeon: Radiologist, Medication, MD;  Location: [facility] OR;  Service: Radiology;  Laterality: Left;   RADIOLOGY WITH ANESTHESIA N/A 10/03/2021   Procedure: MRI CERVICAL SPINE WITH ANESTHESIA;  Surgeon: Radiologist, Medication, MD;  Location: [facility] OR;  Service: Radiology;  Laterality: N/A;   RADIOLOGY WITH ANESTHESIA N/A 01/30/2022   Procedure: MRI CERVICAL SPINE WITH AND WITOHOUT CONTRAST WITH ANESTHESIA;  Surgeon: Radiologist, Medication, MD;  Location: [facility] OR;  Service: Radiology;  Laterality: N/A;   RADIOLOGY WITH ANESTHESIA N/A 05/22/2022   Procedure: MRI LUMBER SPINE AND CERVIC SPINE WITH AND WITHOUT CONTRAST;  Surgeon:  Radiologist, Medication, MD;  Location: [facility] OR;  Service: Radiology;  Laterality: N/A;   REVERSE SHOULDER ARTHROPLASTY Left    ROTATOR CUFF REPAIR  11/2010   Dr Hyacinth Meeker   TONSILLECTOMY     TOTAL HIP ARTHROPLASTY Right 02/24/2009   TOTAL HIP ARTHROPLASTY Left 06/2016   Dr Odis Luster    SOCIAL HISTORY: Social History   Socioeconomic History   Marital status: Married    Spouse name: [name]    Number of children: 0   Years of education: Not on file   Highest education level: Bachelor's degree (e.g., BA, AB, BS)  Occupational History   Occupation: Warden/ranger: [employer]  Tobacco Use   Smoking status: Never    Passive exposure: Never   Smokeless tobacco: Never  Vaping Use   Vaping Use: Never used  Substance and Sexual Activity   Alcohol use: Not Currently    Comment: Alcohol once every few weeks/months   Drug use: No   Sexual activity: Not Currently  Other Topics Concern   Not on file  Social History Narrative   Lives at home with spouse.    Social Determinants of Health   Financial Resource Strain: Low Risk  (06/30/2022)   Overall Financial Resource Strain (CARDIA)    Difficulty of Paying Living Expenses: Not hard at all  Food Insecurity: No Food Insecurity (06/30/2022)   Hunger Vital Sign    Worried About Running Out of Food in the Last Year: Never true  Ran Out of Food in the Last Year: Never true  Transportation Needs: No Transportation Needs (06/30/2022)   PRAPARE - Administrator, Civil Service (Medical): No    Lack of Transportation (Non-Medical): No  Physical Activity: Unknown (06/30/2022)   Exercise Vital Sign    Days of Exercise per Week: 0 days    Minutes of Exercise per Session: Not on file  Stress: No Stress Concern Present (06/30/2022)   [employer] of Occupational Health - Occupational Stress Questionnaire    Feeling of Stress : Only a little  Social Connections: Moderately Integrated (06/30/2022)   Social Connection and Isolation  Panel [NHANES]    Frequency of Communication with Friends and Family: Three times a week    Frequency of Social Gatherings with Friends and Family: More than three times a week    Attends Religious Services: Never    Database administrator or Organizations: Yes    Attends Engineer, structural: 1 to 4 times per year    Marital Status: Married  Catering manager Violence: Not on file    FAMILY HISTORY: Family History  Problem Relation Age of Onset   Hypertension Mother    Stroke Mother    Gout Father    Heart attack Father 71   Heart disease Sister    Lung cancer Sister    Drug abuse Sister    Breast cancer Paternal Aunt    Lung cancer Maternal Grandmother    Colon cancer Neg Hx    Esophageal cancer Neg Hx    Rectal cancer Neg Hx    Stomach cancer Neg Hx     ALLERGIES:  has No Known Allergies.  MEDICATIONS:  Current Outpatient Medications  Medication Sig Dispense Refill   acetaminophen (TYLENOL) 325 MG tablet Take 1-2 tablets (325-650 mg total) by mouth every 4 (four) hours as needed for mild pain. (Patient taking differently: Take 1,000 mg by mouth 2 (two) times daily.)     ascorbic acid (VITAMIN C) 500 MG tablet Take 1 tablet (500 mg total) by mouth daily. (Patient taking differently: Take 1,000 mg by mouth daily.) 30 tablet 0   aspirin EC 81 MG tablet Take 1 tablet (81 mg total) by mouth daily. Swallow whole. (Patient taking differently: Take 162 mg by mouth in the morning. Swallow whole.) 30 tablet 12   atorvastatin (LIPITOR) 40 MG tablet Take 1 tablet (40 mg total) by mouth daily. 90 tablet 3   cetirizine (ZYRTEC) 10 MG tablet Take 10 mg by mouth at bedtime.     clonazePAM (KLONOPIN) 0.5 MG tablet Take 1 tablet (0.5 mg total) by mouth 2 (two) times daily as needed for anxiety. (Patient taking differently: Take 0.5 mg by mouth 2 (two) times daily.) 180 tablet 0   CRANBERRY PO Take 4,200 mg by mouth 2 (two) times daily. W/Vit C     famotidine (PEPCID) 20 MG tablet  Take 1 tablet (20 mg total) by mouth daily. (Patient taking differently: Take 20 mg by mouth at bedtime.) 30 tablet 0   FLUoxetine (PROZAC) 20 MG capsule Take 1 capsule (20 mg total) by mouth at bedtime. 90 capsule 3   fluticasone (FLONASE) 50 MCG/ACT nasal spray Place 1 spray into both nostrils daily. 48 g 3   levothyroxine (SYNTHROID) 88 MCG tablet Take 1 tablet (88 mcg total) by mouth daily. 90 tablet 3   lisinopril (ZESTRIL) 10 MG tablet Take 1 tablet (10 mg total) by mouth daily. 90 tablet 3  Multiple Vitamin (MULTIVITAMIN) tablet Take 1 tablet by mouth daily.     potassium chloride (KLOR-CON M) 10 MEQ tablet Take 1 tablet (10 mEq total) by mouth at bedtime. 90 tablet 3   sennosides-docusate sodium (SENOKOT-S) 8.6-50 MG tablet Take 2 tablets by mouth daily. (Patient taking differently: Take 2 tablets by mouth daily with lunch.) 60 tablet 11   tamsulosin (FLOMAX) 0.4 MG CAPS capsule Take 1 capsule (0.4 mg total) by mouth daily after supper. (Patient taking differently: Take 0.4 mg by mouth in the morning.) 90 capsule 3   enzalutamide (XTANDI) 40 MG tablet Take 4 tablets (160 mg total) by mouth daily. 120 tablet 2   No current facility-administered medications for this visit.    Review of Systems  Constitutional:  Positive for fatigue. Negative for appetite change and chills.  HENT:   Negative for hearing loss.   Eyes:  Negative for icterus.  Respiratory:  Negative for cough and shortness of breath.   Cardiovascular:  Negative for chest pain.  Gastrointestinal:  Negative for abdominal pain and blood in stool.  Genitourinary:         + Foley catheter  Musculoskeletal:  Negative for back pain.  Skin:  Negative for rash.  Neurological:  Positive for extremity weakness.     PHYSICAL EXAMINATION: ECOG PERFORMANCE STATUS: 2 - Symptomatic, <50% confined to bed  Vitals:   07/04/22 1005  BP: 135/82  Pulse: 64  Resp: 18  Temp: (!) 96.9 F (36.1 C)  SpO2: 97%   There were no vitals  filed for this visit.     Physical Exam Constitutional:      General: He is not in acute distress.    Appearance: He is not diaphoretic.     Comments: Patient sits in wheelchair.  HENT:     Head: Normocephalic and atraumatic.     Nose: Nose normal.     Mouth/Throat:     Pharynx: No oropharyngeal exudate.  Eyes:     General: No scleral icterus. Cardiovascular:     Rate and Rhythm: Normal rate.  Pulmonary:     Effort: Pulmonary effort is normal. No respiratory distress.  Abdominal:     General: There is no distension.  Musculoskeletal:        General: Normal range of motion.     Cervical back: Normal range of motion.  Skin:    General: Skin is warm and dry.     Findings: No erythema.  Neurological:     Mental Status: He is alert and oriented to person, place, and time.     Cranial Nerves: No cranial nerve deficit.     Motor: No abnormal muscle tone.     Comments: Quadriplegia  Psychiatric:        Mood and Affect: Mood and affect normal.      LABORATORY DATA:  I have reviewed the data as listed    Latest Ref Rng & Units 07/04/2022    9:53 AM 04/25/2022    9:42 AM 03/16/2022   10:53 PM  CBC  WBC 4.0 - 10.5 K/uL 6.5  4.0  13.4   Hemoglobin 13.0 - 17.0 g/dL 13.0  86.5  78.4   Hematocrit 39.0 - 52.0 % 41.4  43.4  39.2   Platelets 150 - 400 K/uL 277  276  253       Latest Ref Rng & Units 07/04/2022    9:53 AM 04/25/2022    9:41 AM 03/16/2022   10:53 PM  CMP  Glucose 70 - 99 mg/dL 89  92    93  086   BUN 8 - 23 mg/dL 8  8    9  10    Creatinine 0.61 - 1.24 mg/dL 5.78  4.69    6.29  5.28   Sodium 135 - 145 mmol/L 135  139    138  135   Potassium 3.5 - 5.1 mmol/L 3.5  3.6    3.6  3.0   Chloride 98 - 111 mmol/L 97  101    101  101   CO2 22 - 32 mmol/L 27  28    28  23    Calcium 8.9 - 10.3 mg/dL 8.8  9.1    9.1  8.6   Total Protein 6.5 - 8.1 g/dL 6.5  6.9    Total Bilirubin 0.3 - 1.2 mg/dL 0.9  0.7    Alkaline Phos 38 - 126 U/L 69  88    AST 15 - 41 U/L 23  26     ALT 0 - 44 U/L 14  18       RADIOGRAPHIC STUDIES: I have personally reviewed the radiological images as listed and agreed with the findings in the report. No results found.

## 2022-07-22 ENCOUNTER — Other Ambulatory Visit: Payer: Self-pay | Admitting: Internal Medicine

## 2022-07-22 MED ORDER — CLONAZEPAM 0.5 MG PO TABS: 0.5000 mg | ORAL_TABLET | Freq: Two times a day (BID) | ORAL | 0 refills | Status: DC | PRN

## 2022-07-25 ENCOUNTER — Encounter: Payer: Self-pay | Admitting: Internal Medicine

## 2022-07-30 ENCOUNTER — Encounter: Payer: Self-pay | Admitting: Oncology

## 2022-07-31 ENCOUNTER — Telehealth: Payer: Self-pay | Admitting: Internal Medicine

## 2022-07-31 NOTE — Telephone Encounter (Signed)
Home Health verbal orders Caller Name:Tiffany Agency Name: [facility]  Callback number: [phone] opt 2  Requesting OT/PT/Skilled nursing/Social Work/Speech: OT  Reason: RECERTIFICATION  Frequency:2WK4  2 EVERY 2 WKS FOR 2 WKS 2 WK 2  1 WK1  Please forward to [facility] pool or providers CMA

## 2022-08-01 NOTE — Telephone Encounter (Signed)
Verbal orders given to Tiffany.  

## 2022-08-05 ENCOUNTER — Telehealth: Payer: Self-pay | Admitting: Internal Medicine

## 2022-08-05 ENCOUNTER — Encounter: Payer: Self-pay | Admitting: Oncology

## 2022-08-05 NOTE — Telephone Encounter (Signed)
Verbal orders left on verified VM for Misty Stanley

## 2022-08-05 NOTE — Telephone Encounter (Signed)
Home Health verbal orders Caller Name:Lisa Agency Name: [facility]  Callback number: [phone]  Requesting OT/PT/Skilled nursing/Social Work/Speech:OT  Reason:mobility,independence ADL'S  Frequency:2 X W 8 WKS  Please forward to [facility] pool or providers CMA

## 2022-08-05 NOTE — Telephone Encounter (Signed)
Neuromuscular and Muscular Electrical Stimulation (NMES) is a modality that sends electrical impulses to nerves which causes the muscles to contract mimicking the action potential coming from the central nervous system. Similar to a TENS Unit.  I called and spoke to both patient's wife, Claris Che, and pt. They want to talk about it a little more and discuss it with Misty Stanley on Thursday. They think that maybe she is jumping the gun a little early on the sit to stand lift but like the idea of the NMES. They will send [place] a MyChart message as to what we need to do.

## 2022-08-05 NOTE — Telephone Encounter (Signed)
Misty Stanley from [facility] would like to know if patient could receive an order for a sit to stand lift to upgrade from the [employer] lift. And could he also receive an order for a NMES machine to help with muscle contraction. She said that she thinks he will really benefit from this equipment.

## 2022-08-06 NOTE — Telephone Encounter (Signed)
Called and spoke to [place] to find out where to send the rx for the NMES. She said she did not know. Was hoping we knew. She will call around and let us know.   I will hold on to the rx until I hear back.

## 2022-08-08 ENCOUNTER — Other Ambulatory Visit
Admission: RE | Admit: 2022-08-08 | Discharge: 2022-08-08 | Disposition: A | Discharge status: Home / Self Care | Payer: Managed Care, Other (non HMO) | Attending: Internal Medicine | Admitting: Internal Medicine

## 2022-08-08 ENCOUNTER — Encounter: Payer: Self-pay | Admitting: Oncology

## 2022-08-08 ENCOUNTER — Ambulatory Visit: Payer: Managed Care, Other (non HMO)

## 2022-08-08 ENCOUNTER — Inpatient Hospital Stay: Payer: Managed Care, Other (non HMO) | Attending: Anatomic Pathology & Clinical Pathology

## 2022-08-08 ENCOUNTER — Encounter: Payer: Self-pay | Admitting: Medical

## 2022-08-08 ENCOUNTER — Ambulatory Visit: Payer: Managed Care, Other (non HMO) | Attending: Medical | Admitting: Medical

## 2022-08-08 VITALS — BP 126/64 | HR 71 | Ht 69.0 in | Wt 179.0 lb

## 2022-08-08 DIAGNOSIS — C7951 Secondary malignant neoplasm of bone: Secondary | ICD-10-CM | POA: Diagnosis present

## 2022-08-08 DIAGNOSIS — C7940 Secondary malignant neoplasm of unspecified part of nervous system: Secondary | ICD-10-CM | POA: Insufficient documentation

## 2022-08-08 DIAGNOSIS — Z5111 Encounter for antineoplastic chemotherapy: Secondary | ICD-10-CM | POA: Insufficient documentation

## 2022-08-08 DIAGNOSIS — C61 Malignant neoplasm of prostate: Secondary | ICD-10-CM | POA: Diagnosis present

## 2022-08-08 DIAGNOSIS — I493 Ventricular premature depolarization: Secondary | ICD-10-CM

## 2022-08-08 DIAGNOSIS — I10 Essential (primary) hypertension: Secondary | ICD-10-CM | POA: Diagnosis not present

## 2022-08-08 DIAGNOSIS — Z86718 Personal history of other venous thrombosis and embolism: Secondary | ICD-10-CM

## 2022-08-08 DIAGNOSIS — E782 Mixed hyperlipidemia: Secondary | ICD-10-CM

## 2022-08-08 LAB — TSH: TSH: 6.411 u[IU]/mL — ABNORMAL HIGH (ref 0.350–4.500)

## 2022-08-08 LAB — MAGNESIUM: Magnesium: 2.2 mg/dL (ref 1.7–2.4)

## 2022-08-08 MED ORDER — METOPROLOL SUCCINATE ER 50 MG PO TB24: 50.0000 mg | ORAL_TABLET | Freq: Every day | ORAL | 3 refills | Status: DC

## 2022-08-08 MED ORDER — LEUPROLIDE ACETATE (6 MONTH) 45 MG ~~LOC~~ KIT
45.0000 mg | PACK | Freq: Once | SUBCUTANEOUS | Status: AC
  Administered 2022-08-08: 45 mg via SUBCUTANEOUS
  Filled 2022-08-08: qty 45

## 2022-08-08 NOTE — Progress Notes (Signed)
Cardiology Office Note:    Date:  08/08/2022   ID:  Jason [name], DOB 01-19-1956, MRN [id number]  PCP:  Jason Schwalbe, MD  [facility] Cardiologist:  Jason Kendall, MD  [facility] Electrophysiologist:  None   Referring MD: Jason Schwalbe, MD   Chief Complaint: low heart rate  History of Present Illness:    Jason [name] is a 67 y.o. male with a hx of metastatic prostate cancer, h/o stroke, HTN, thyroid disease, GERD, osteoarthritis who presents for low heart rate.    The patient underwent remote stress test in 2002 which led to cardiac cath that showed no significant CAD.    The patient was evaluated for palpitations in 2022. He underwent cardiac cath 02/2020 that showed no significant CAD, normal LV function with mildly elevated filling pressures consistent with diastolic dysfunction. He was started on metorpolol for PVCs.    Seen in the office 03/30/20 reporting palpitations. A heart monitor was ordered that showed SR, rare PVC/PACs, NSVT, PSVT. He was placed on Toprol.   Echo 06/2020 showed LVEF 50-55%, no WMA, mild LVH, G1DD, RVSF, normal RV size and function.  Today, the patient reports possible low heart rate. PT has been working with the patient and they check his pulse and O2 is good, the heart rate is in the 30-40s. On the other hand heart rate is 60-70. Patient denies chest pain, SOB, palpitations. Occasional lower leg edema, orthopnea or pnd. He reports Toprol was previously stopped in 09/2021 during a hospitalization. EKG today shows NSR with frequent PVCs.    Past Medical History:  Diagnosis Date   Allergy    Arthritis    osteoarthritis   GERD (gastroesophageal reflux disease)    History of kidney stones    Hypertension    Hypothyroidism    Palpitations 2022   Pneumonia    Stroke [facility])    Mini stroke - Aug 2023   Thyroid disease    Hyperthyroidism s/p radioactive iodine ablation    Past Surgical History:  Procedure Laterality Date    CARDIAC CATHETERIZATION  03/2000   HERNIA REPAIR  1961   umbilical   LEFT HEART CATH AND CORONARY ANGIOGRAPHY Left 03/20/2020   Procedure: LEFT HEART CATH AND CORONARY ANGIOGRAPHY;  Surgeon: Jason Kendall, MD;  Location: [facility] INVASIVE CV LAB;  Service: Cardiovascular;  Laterality: Left;   POSTERIOR CERVICAL FUSION/FORAMINOTOMY N/A 10/06/2021   Procedure: POSTERIOR CERVICAL FUSION/ FORAMINOTOMY LEVEL 3;  Surgeon: Jason Night, MD;  Location: [facility] ORS;  Service: Neurosurgery;  Laterality: N/A;  will need monitoring   RADIOLOGY WITH ANESTHESIA Left 04/18/2021   Procedure: MRI SHOULDER WITHOUT CONTRAST WITH ANESTHESIA;  Surgeon: Radiologist, Medication, MD;  Location: [facility] OR;  Service: Radiology;  Laterality: Left;   RADIOLOGY WITH ANESTHESIA N/A 10/03/2021   Procedure: MRI CERVICAL SPINE WITH ANESTHESIA;  Surgeon: Radiologist, Medication, MD;  Location: [facility] OR;  Service: Radiology;  Laterality: N/A;   RADIOLOGY WITH ANESTHESIA N/A 01/30/2022   Procedure: MRI CERVICAL SPINE WITH AND WITOHOUT CONTRAST WITH ANESTHESIA;  Surgeon: Radiologist, Medication, MD;  Location: [facility] OR;  Service: Radiology;  Laterality: N/A;   RADIOLOGY WITH ANESTHESIA N/A 05/22/2022   Procedure: MRI LUMBER SPINE AND CERVIC SPINE WITH AND WITHOUT CONTRAST;  Surgeon: Radiologist, Medication, MD;  Location: [facility] OR;  Service: Radiology;  Laterality: N/A;   REVERSE SHOULDER ARTHROPLASTY Left    ROTATOR CUFF REPAIR  11/2010   Dr Jason [name]   TONSILLECTOMY     TOTAL HIP ARTHROPLASTY Right 02/24/2009  TOTAL HIP ARTHROPLASTY Left 06/2016   Dr Odis [name]    Current Medications: Current Meds  Medication Sig   acetaminophen (TYLENOL) 325 MG tablet Take 1-2 tablets (325-650 mg total) by mouth every 4 (four) hours as needed for mild pain. (Patient taking differently: Take 1,000 mg by mouth 2 (two) times daily.)   ascorbic acid (VITAMIN C) 500 MG tablet Take 1 tablet (500 mg total) by mouth daily. (Patient taking differently: Take 1,000 mg  by mouth daily.)   aspirin EC 81 MG tablet Take 1 tablet (81 mg total) by mouth daily. Swallow whole. (Patient taking differently: Take 162 mg by mouth in the morning. Swallow whole.)   atorvastatin (LIPITOR) 40 MG tablet Take 1 tablet (40 mg total) by mouth daily.   cetirizine (ZYRTEC) 10 MG tablet Take 10 mg by mouth at bedtime.   clonazePAM (KLONOPIN) 0.5 MG tablet Take 1 tablet (0.5 mg total) by mouth 2 (two) times daily as needed for anxiety.   CRANBERRY PO Take 4,200 mg by mouth 2 (two) times daily. W/Vit C   enzalutamide (XTANDI) 40 MG tablet Take 4 tablets (160 mg total) by mouth daily.   famotidine (PEPCID) 20 MG tablet Take 1 tablet (20 mg total) by mouth daily. (Patient taking differently: Take 20 mg by mouth at bedtime.)   FLUoxetine (PROZAC) 20 MG capsule Take 1 capsule (20 mg total) by mouth at bedtime.   fluticasone (FLONASE) 50 MCG/ACT nasal spray Place 1 spray into both nostrils daily.   levothyroxine (SYNTHROID) 88 MCG tablet Take 1 tablet (88 mcg total) by mouth daily.   lisinopril (ZESTRIL) 10 MG tablet Take 1 tablet (10 mg total) by mouth daily.   metoprolol succinate (TOPROL-XL) 50 MG 24 hr tablet Take 1 tablet (50 mg total) by mouth at bedtime. Take with or immediately following a meal.   Multiple Vitamin (MULTIVITAMIN) tablet Take 1 tablet by mouth daily.   potassium chloride (KLOR-CON M) 10 MEQ tablet Take 1 tablet (10 mEq total) by mouth at bedtime.   sennosides-docusate sodium (SENOKOT-S) 8.6-50 MG tablet Take 2 tablets by mouth daily. (Patient taking differently: Take 2 tablets by mouth daily with lunch.)   tamsulosin (FLOMAX) 0.4 MG CAPS capsule Take 1 capsule (0.4 mg total) by mouth daily after supper. (Patient taking differently: Take 0.4 mg by mouth in the morning.)     Allergies:   Patient has no known allergies.   Social History   Socioeconomic History   Marital status: Married    Spouse name: Jason [name]    Number of children: 0   Years of education: Not on  file   Highest education level: Bachelor's degree (e.g., BA, AB, BS)  Occupational History   Occupation: Warden/ranger: [employer]  Tobacco Use   Smoking status: Never    Passive exposure: Never   Smokeless tobacco: Never  Vaping Use   Vaping Use: Never used  Substance and Sexual Activity   Alcohol use: Not Currently    Comment: Alcohol once every few weeks/months   Drug use: No   Sexual activity: Not Currently  Other Topics Concern   Not on file  Social History Narrative   Lives at home with spouse.    Social Determinants of Health   Financial Resource Strain: Low Risk  (06/30/2022)   Overall Financial Resource Strain (CARDIA)    Difficulty of Paying Living Expenses: Not hard at all  Food Insecurity: No Food Insecurity (06/30/2022)   Hunger Vital Sign  Worried About Programme researcher, broadcasting/film/video in the Last Year: Never true    Ran Out of Food in the Last Year: Never true  Transportation Needs: No Transportation Needs (06/30/2022)   PRAPARE - Administrator, Civil Service (Medical): No    Lack of Transportation (Non-Medical): No  Physical Activity: Unknown (06/30/2022)   Exercise Vital Sign    Days of Exercise per Week: 0 days    Minutes of Exercise per Session: Not on file  Stress: No Stress Concern Present (06/30/2022)   [employer] of Occupational Health - Occupational Stress Questionnaire    Feeling of Stress : Only a little  Social Connections: Moderately Integrated (06/30/2022)   Social Connection and Isolation Panel [NHANES]    Frequency of Communication with Friends and Family: Three times a week    Frequency of Social Gatherings with Friends and Family: More than three times a week    Attends Religious Services: Never    Database administrator or Organizations: Yes    Attends Engineer, structural: 1 to 4 times per year    Marital Status: Married     Family History: The patient's =family history includes Breast cancer in his paternal  aunt; Drug abuse in his sister; Gout in his father; Heart attack (age of onset: 35) in his father; Heart disease in his sister; Hypertension in his mother; Lung cancer in his maternal grandmother and sister; Stroke in his mother. There is no history of Colon cancer, Esophageal cancer, Rectal cancer, or Stomach cancer.  ROS:   Please see the history of present illness.   All other systems reviewed and are negative.  EKGs/Labs/Other Studies Reviewed:    The following studies were reviewed today:  Echo limited bubble study 09/2030 1. Left ventricular ejection fraction, by estimation, is 60 to 65%. The  left ventricle has normal function. The left ventricle has no regional  wall motion abnormalities. Left ventricular diastolic parameters are  indeterminate.   2. Right ventricular systolic function is normal. The right ventricular  size is normal   3. Agitated saline contrast bubble study was negative, with no evidence  of any interatrial shun   4. Limited study    Echo 09/2021 1. Left ventricular ejection fraction, by estimation, is 55 to 60%. The  left ventricle has normal function. The left ventricle has no regional  wall motion abnormalities. Left ventricular diastolic parameters were  normal.   2. Right ventricular systolic function is normal. The right ventricular  size is normal.   3. The mitral valve is normal in structure. Mild mitral valve  regurgitation. No evidence of mitral stenosis.   4. The aortic valve is normal in structure. Aortic valve regurgitation is  not visualized. Mild aortic valve stenosis.   5. The inferior vena cava is normal in size with greater than 50%  respiratory variability, suggesting right atrial pressure of 3 mmHg.   EKG:  EKG is ordered today.  The ekg ordered today demonstrates NSR 71bpm, frequent PVCs, LVH with repol  Recent Labs: 01/20/2022: Magnesium 2.0 07/04/2022: ALT 14; BUN 8; Creatinine, Ser 0.47; Hemoglobin 13.9; Platelets 277; Potassium  3.5; Sodium 135  Recent Lipid Panel    Component Value Date/Time   CHOL 115 10/17/2021 1232   CHOL 125 01/29/2021 0822   TRIG 117 10/08/2021 0355   HDL 49 01/29/2021 0822   CHOLHDL 2.6 01/29/2021 0822   CHOLHDL 3.9 07/25/2020 1657   VLDL 15 07/25/2020 1657   LDLCALC 62  01/29/2021 0822   LDLDIRECT 62 10/10/2021 0455     Physical Exam:    VS:  BP 126/64 (BP Location: Left Arm, Patient Position: Sitting, Cuff Size: Normal)   Pulse 71   Ht 5\' 9"  (1.753 m)   Wt 179 lb (81.2 kg)   SpO2 95%   BMI 26.43 kg/m     Wt Readings from Last 3 Encounters:  08/08/22 179 lb (81.2 kg)  06/27/22 179 lb (81.2 kg)  06/24/22 179 lb (81.2 kg)     GEN:  Well nourished, well developed in no acute distress HEENT: Normal NECK: No JVD; No carotid bruits LYMPHATICS: No lymphadenopathy CARDIAC: RRR, no murmurs, rubs, gallops RESPIRATORY:  Clear to auscultation without rales, wheezing or rhonchi  ABDOMEN: Soft, non-tender, non-distended MUSCULOSKELETAL:  No edema; No deformity  SKIN: Warm and dry NEUROLOGIC:  Alert and oriented x 3 PSYCHIATRIC:  Normal affect   ASSESSMENT:    1. PVC's (premature ventricular contractions)   2. Essential hypertension   3. Hyperlipidemia, mixed   4. History of deep vein thrombosis (DVT) of lower extremity    PLAN:    In order of problems listed above:  PVCs Low heart rate Patient is currently doing PT and occasionally heart rate is noted to be in the 30-40s on one had and 60-70s on the other. Patient denies significant symptoms. EKG today shows frequent PVCs, which is the likely culprit. Patient was previously on Toprol 100mg  daily. I will start Toprol 50mg  daily. I will order a 2 week heart monitor. I will update a Mag and TSH. Heart monitor in 2022 showed NSR with rare PACs, PVCs, pSVT and NSVT.  HTN BP is good today, continue Lisinopril. Plan to start Toprol 50mg  as above.   HLD LDL 62, continue Lipitor 40mg  daily.   H/o DVT  Eliquis was stopped by  oncology as it interacts with chemotherapy drug. He is taking Aspiring 81mg  daily.   Disposition: Follow up in 6-8  week(s) with MD/APP    Signed, Crystal Ellwood David Stall, PA-C  08/08/2022 1:28 PM    [facility]

## 2022-08-08 NOTE — Patient Instructions (Signed)
Medication Instructions:  Your physician recommends the following medication changes.  START TAKING: Metoprolol succinate 50 mg once a day at night    *If you need a refill on your cardiac medications before your next appointment, please call your pharmacy*   Lab Work: Your provider would like for you to have following labs drawn: MG.   Please go to the [facility] entrance and check in at the front desk.  You do not need an appointment.  They are open from 7am-6 pm.   If you have labs (blood work) drawn today and your tests are completely normal, you will receive your results only by: MyChart Message (if you have MyChart) OR A paper copy in the mail If you have any lab test that is abnormal or we need to change your treatment, we will call you to review the results.   Testing/Procedures: Your physician has recommended that you wear a Zio monitor 14 days.   This monitor is a medical device that records the heart's electrical activity. Doctors most often use these monitors to diagnose arrhythmias. Arrhythmias are problems with the speed or rhythm of the heartbeat. The monitor is a small device applied to your chest. You can wear one while you do your normal daily activities. While wearing this monitor if you have any symptoms to push the button and record what you felt. Once you have worn this monitor for the period of time provider prescribed (Usually 14 days), you will return the monitor device in the postage paid box. Once it is returned they will download the data collected and provide [place] with a report which the provider will then review and we will call you with those results. Important tips:  Avoid showering during the first 24 hours of wearing the monitor. Avoid excessive sweating to help maximize wear time. Do not submerge the device, no hot tubs, and no swimming pools. Keep any lotions or oils away from the patch. After 24 hours you may shower with the patch on. Take  brief showers with your back facing the shower head.  Do not remove patch once it has been placed because that will interrupt data and decrease adhesive wear time. Push the button when you have any symptoms and write down what you were feeling. Once you have completed wearing your monitor, remove and place into box which has postage paid and place in your outgoing mailbox.  If for some reason you have misplaced your box then call our office and we can provide another box and/or mail it off for you.     Follow-Up: At [facility], you and your health needs are our priority.  As part of our continuing mission to provide you with exceptional heart care, we have created designated Provider Care Teams.  These Care Teams include your primary Cardiologist (physician) and Advanced Practice Providers (APPs -  Physician Assistants and Nurse Practitioners) who all work together to provide you with the care you need, when you need it.  We recommend signing up for the patient portal called "MyChart".  Sign up information is provided on this After Visit Summary.  MyChart is used to connect with patients for Virtual Visits (Telemedicine).  Patients are able to view lab/test results, encounter notes, upcoming appointments, etc.  Non-urgent messages can be sent to your provider as well.   To learn more about what you can do with MyChart, go to [email or web address].    Your next appointment:   2 month(s)  Provider:   You may see Yvonne Kendall, MD or one of the following Advanced Practice Providers on your designated Care Team:   Cadence Sh[place], [place]

## 2022-08-12 ENCOUNTER — Telehealth: Payer: Self-pay | Admitting: Internal Medicine

## 2022-08-12 DIAGNOSIS — I493 Ventricular premature depolarization: Secondary | ICD-10-CM | POA: Diagnosis not present

## 2022-08-12 NOTE — Telephone Encounter (Signed)
Home Health verbal orders Caller Name:[name] Agency Name: [facility] number: [phone]  Requesting OT/PT/Skilled nursing/Social Work/Speech: PT  Reason: IMPROVING FUNCTIONAL MOBILITY AND SAFETY  Frequency:2WK8  Please forward to [facility] pool or providers CMA

## 2022-08-12 NOTE — Telephone Encounter (Signed)
Verbal orders given to [name]  

## 2022-08-13 ENCOUNTER — Telehealth: Payer: Self-pay | Admitting: Internal Medicine

## 2022-08-13 NOTE — Telephone Encounter (Signed)
Home Health verbal orders Caller Name: Elnita Maxwell Agency Name: [facility] number: [phone]  Requesting OT/PT/Skilled nursing/Social Work/Speech: skilled nursing  Reason:foley cath changes  Frequency: once monthly for two months  Please forward to [facility] pool or providers CMA

## 2022-08-14 NOTE — Telephone Encounter (Signed)
Verbal orders given to Cheryl. 

## 2022-09-09 ENCOUNTER — Other Ambulatory Visit: Payer: Self-pay

## 2022-09-09 ENCOUNTER — Encounter: Payer: Self-pay | Admitting: Oncology

## 2022-09-09 DIAGNOSIS — I493 Ventricular premature depolarization: Secondary | ICD-10-CM

## 2022-09-10 ENCOUNTER — Encounter: Payer: Self-pay | Admitting: Physical Medicine and Rehabilitation

## 2022-09-10 ENCOUNTER — Encounter: Payer: Self-pay | Admitting: Internal Medicine

## 2022-09-10 ENCOUNTER — Telehealth: Payer: Self-pay

## 2022-09-10 ENCOUNTER — Ambulatory Visit (INDEPENDENT_AMBULATORY_CARE_PROVIDER_SITE_OTHER): Payer: Managed Care, Other (non HMO) | Admitting: Internal Medicine

## 2022-09-10 VITALS — BP 120/74 | HR 56 | Resp 12

## 2022-09-10 DIAGNOSIS — N319 Neuromuscular dysfunction of bladder, unspecified: Secondary | ICD-10-CM

## 2022-09-10 DIAGNOSIS — C61 Malignant neoplasm of prostate: Secondary | ICD-10-CM | POA: Diagnosis not present

## 2022-09-10 DIAGNOSIS — G8220 Paraplegia, unspecified: Secondary | ICD-10-CM | POA: Diagnosis not present

## 2022-09-10 DIAGNOSIS — I471 Supraventricular tachycardia, unspecified: Secondary | ICD-10-CM | POA: Diagnosis not present

## 2022-09-10 DIAGNOSIS — K592 Neurogenic bowel, not elsewhere classified: Secondary | ICD-10-CM

## 2022-09-10 NOTE — Assessment & Plan Note (Signed)
Doing well with senna at lunch and nightly suppository (then goes every night)

## 2022-09-10 NOTE — Assessment & Plan Note (Signed)
Found on zio monitor that was done due to skips that therapist noted No palpitations Sent message to Dr Waldron Labs don't think action is needed on this Is on metoprolol 50mg  daily

## 2022-09-10 NOTE — Assessment & Plan Note (Signed)
Still with Foley but looking into suprapubic catheter---discussed that I think this is a good idea

## 2022-09-10 NOTE — Telephone Encounter (Signed)
Called and spoke to [place], Waldron at [employer]. He said he was not sure if they could get one for him to try and would look in to it. He will get back with [place].

## 2022-09-10 NOTE — Telephone Encounter (Signed)
-----   Message from Tillman Abide sent at 09/10/2022  2:16 PM EDT ----- Please call [facility] Find out if they can bring the Sit to stand lift to his house to try (I think they can now) and then remove the Michiel Sites if it works out

## 2022-09-10 NOTE — Progress Notes (Signed)
Subjective:    Patient ID: Jason [name], male    DOB: Jun 24, 1955, 67 y.o.   MRN: [id number]  HPI Home visit for follow up of paraplegia and related medical conditions Wife is here as usual  Doing well Continues to make slow progress with therapy---they have found the therapists from [facility] much better They are pushing him more Able to stand for as long as 11 minutes Still needs help to transfer out of the motorized chair  Hoyer lift still Wife still waits for neighbor's to help--just for safety (floor is uneven)  No dizziness when up No chest pain or SOB Had Zio monitor done----due to low pulse on oximeter (as low as in 30's) and extra beats Zio monitor shows 11 runs of SVT and NSVT up to 7 beats.  No palpitations though No edema Continues on the metoprolol  Able to eat, shave, brush teeth --using right hand  Foley still in  Will be changed tomorrow Going back to [facility]---planning to go to suprapubic  Bowel regimen generally still working well Suppository at bedtime, senna at lunch--generally goes at night so wife can change him before transferring to wheelchair in AM.hist Current Outpatient Medications on File Prior to Visit  Medication Sig Dispense Refill   acetaminophen (TYLENOL) 325 MG tablet Take 1-2 tablets (325-650 mg total) by mouth every 4 (four) hours as needed for mild pain. (Patient taking differently: Take 1,000 mg by mouth 2 (two) times daily.)     ascorbic acid (VITAMIN C) 500 MG tablet Take 1 tablet (500 mg total) by mouth daily. (Patient taking differently: Take 1,000 mg by mouth daily.) 30 tablet 0   aspirin EC 81 MG tablet Take 1 tablet (81 mg total) by mouth daily. Swallow whole. (Patient taking differently: Take 162 mg by mouth in the morning. Swallow whole.) 30 tablet 12   atorvastatin (LIPITOR) 40 MG tablet Take 1 tablet (40 mg total) by mouth daily. 90 tablet 3   cetirizine (ZYRTEC) 10 MG tablet Take 10 mg by mouth at bedtime.     clonazePAM  (KLONOPIN) 0.5 MG tablet Take 1 tablet (0.5 mg total) by mouth 2 (two) times daily as needed for anxiety. 180 tablet 0   CRANBERRY PO Take 4,200 mg by mouth 2 (two) times daily. W/Vit C     enzalutamide (XTANDI) 40 MG tablet Take 4 tablets (160 mg total) by mouth daily. 120 tablet 2   famotidine (PEPCID) 20 MG tablet Take 1 tablet (20 mg total) by mouth daily. (Patient taking differently: Take 20 mg by mouth at bedtime.) 30 tablet 0   FLUoxetine (PROZAC) 20 MG capsule Take 1 capsule (20 mg total) by mouth at bedtime. 90 capsule 3   fluticasone (FLONASE) 50 MCG/ACT nasal spray Place 1 spray into both nostrils daily. 48 g 3   levothyroxine (SYNTHROID) 88 MCG tablet Take 1 tablet (88 mcg total) by mouth daily. 90 tablet 3   lisinopril (ZESTRIL) 10 MG tablet Take 1 tablet (10 mg total) by mouth daily. 90 tablet 3   metoprolol succinate (TOPROL-XL) 50 MG 24 hr tablet Take 1 tablet (50 mg total) by mouth at bedtime. Take with or immediately following a meal. 90 tablet 3   Multiple Vitamin (MULTIVITAMIN) tablet Take 1 tablet by mouth daily.     potassium chloride (KLOR-CON M) 10 MEQ tablet Take 1 tablet (10 mEq total) by mouth at bedtime. 90 tablet 3   sennosides-docusate sodium (SENOKOT-S) 8.6-50 MG tablet Take 2 tablets by mouth daily. (  Patient taking differently: Take 2 tablets by mouth daily with lunch.) 60 tablet 11   tamsulosin (FLOMAX) 0.4 MG CAPS capsule Take 1 capsule (0.4 mg total) by mouth daily after supper. (Patient taking differently: Take 0.4 mg by mouth in the morning.) 90 capsule 3   No current facility-administered medications on file prior to visit.    No Known Allergies  Past Medical History:  Diagnosis Date   Allergy    Arthritis    osteoarthritis   GERD (gastroesophageal reflux disease)    History of kidney stones    Hypertension    Hypothyroidism    Palpitations 2022   Pneumonia    Stroke [facility])    Mini stroke - Aug 2023   Thyroid disease    Hyperthyroidism s/p  radioactive iodine ablation    Past Surgical History:  Procedure Laterality Date   CARDIAC CATHETERIZATION  03/2000   HERNIA REPAIR  1961   umbilical   LEFT HEART CATH AND CORONARY ANGIOGRAPHY Left 03/20/2020   Procedure: LEFT HEART CATH AND CORONARY ANGIOGRAPHY;  Surgeon: Yvonne Kendall, MD;  Location: [facility] INVASIVE CV LAB;  Service: Cardiovascular;  Laterality: Left;   POSTERIOR CERVICAL FUSION/FORAMINOTOMY N/A 10/06/2021   Procedure: POSTERIOR CERVICAL FUSION/ FORAMINOTOMY LEVEL 3;  Surgeon: Venetia Night, MD;  Location: [facility] ORS;  Service: Neurosurgery;  Laterality: N/A;  will need monitoring   RADIOLOGY WITH ANESTHESIA Left 04/18/2021   Procedure: MRI SHOULDER WITHOUT CONTRAST WITH ANESTHESIA;  Surgeon: Radiologist, Medication, MD;  [facility] OR;  Service: Radiology;  Laterality: Left;   RADIOLOGY WITH ANESTHESIA N/A 10/03/2021   Procedure: MRI CERVICAL SPINE WITH ANESTHESIA;  Surgeon: Radiologist, Medication, MD;  [facility]: [facility] OR;  Service: Radiology;  Laterality: N/A;   RADIOLOGY WITH ANESTHESIA N/A 01/30/2022   Procedure: MRI CERVICAL SPINE WITH AND WITOHOUT CONTRAST WITH ANESTHESIA;  Surgeon: Radiologist, Medication, MD;  [facility];  Service: Radiology;  Laterality: N/A;   RADIOLOGY WITH ANESTHESIA N/A 05/22/2022   Procedure: MRI LUMBER SPINE AND CERVIC SPINE WITH AND WITHOUT CONTRAST;  Surgeon: Radiologist, Medication, MD;  [facility];  Service: Radiology;  Laterality: N/A;   REVERSE SHOULDER ARTHROPLASTY Left    ROTATOR CUFF REPAIR  11/2010   Dr Hyacinth Meeker   TONSILLECTOMY     TOTAL HIP ARTHROPLASTY Right 02/24/2009   TOTAL HIP ARTHROPLASTY Left 06/2016   Dr Odis Luster    Family History  Problem Relation Age of Onset   Hypertension Mother    Stroke Mother    Gout Father    Heart attack Father 34   Heart disease Sister    Lung cancer Sister    Drug abuse Sister    Breast cancer Paternal Aunt    Lung cancer Maternal Grandmother    Colon cancer Neg Hx     Esophageal cancer Neg Hx    Rectal cancer Neg Hx    Stomach cancer Neg Hx     Social History   Socioeconomic History   Marital status: Married    Spouse name: [name]    Number of children: 0   Years of education: Not on file   Highest education level: Bachelor's degree (e.g., BA, AB, BS)  Occupational History   Occupation: Warden/ranger: [employer]  Tobacco Use   Smoking status: Never    Passive exposure: Never   Smokeless tobacco: Never  Vaping Use   Vaping status: Never Used  Substance and Sexual Activity   Alcohol use: Not Currently    Comment: Alcohol  once every few weeks/months   Drug use: No   Sexual activity: Not Currently  Other Topics Concern   Not on file  Social History Narrative   Lives at home with spouse.    Social Determinants of Health   Financial Resource Strain: Low Risk  (06/30/2022)   Overall Financial Resource Strain (CARDIA)    Difficulty of Paying Living Expenses: Not hard at all  Food Insecurity: No Food Insecurity (06/30/2022)   Hunger Vital Sign    Worried About Running Out of Food in the Last Year: Never true    Ran Out of Food in the Last Year: Never true  Transportation Needs: No Transportation Needs (06/30/2022)   PRAPARE - Administrator, Civil Service (Medical): No    Lack of Transportation (Non-Medical): No  Physical Activity: Unknown (06/30/2022)   Exercise Vital Sign    Days of Exercise per Week: 0 days    Minutes of Exercise per Session: Not on file  Stress: No Stress Concern Present (06/30/2022)   [employer] of Occupational Health - Occupational Stress Questionnaire    Feeling of Stress : Only a little  Social Connections: Moderately Integrated (06/30/2022)   Social Connection and Isolation Panel [NHANES]    Frequency of Communication with Friends and Family: Three times a week    Frequency of Social Gatherings with Friends and Family: More than three times a week    Attends Religious Services: Never     Database administrator or Organizations: Yes    Attends Engineer, structural: 1 to 4 times per year    Marital Status: Married  Catering manager Violence: Not on file   Review of Systems Some discomfort in shoulders---usually from exercises Sacral area is closed now--wife uses neosporin and barrier cream/zinc Some right hip pain--no protective adipose tissue now Some ongoing cough--gets sense of having to bring something up (safe to try guaifenesin)    Objective:   Physical Exam Constitutional:      Comments: Still mild wasting--in power chair  Cardiovascular:     Rate and Rhythm: Regular rhythm. Bradycardia present.     Heart sounds: No murmur heard.    No gallop.  Pulmonary:     Effort: Pulmonary effort is normal.     Breath sounds: Normal breath sounds. No wheezing or rales.  Abdominal:     Palpations: Abdomen is soft.     Tenderness: There is no abdominal tenderness.  Musculoskeletal:     Cervical back: Neck supple.     Right lower leg: No edema.     Left lower leg: No edema.  Lymphadenopathy:     Cervical: No cervical adenopathy.  Neurological:     Mental Status: He is alert.     Comments: Left leg-- 3/5 Right leg--able to just lift against gravity when flat on floor in chair Left arm 3/5 Right arm 4/5  Psychiatric:        Mood and Affect: Mood normal.        Behavior: Behavior normal.            Assessment & Plan:

## 2022-09-10 NOTE — Assessment & Plan Note (Signed)
Probably has enough strength in his legs to try sit to stand lift (instead of Michiel Sites) Still needs help with all ADLs except eating (after set up)

## 2022-09-10 NOTE — Assessment & Plan Note (Signed)
PSA now under 4 on xtandi and lupron injections

## 2022-09-12 ENCOUNTER — Encounter: Payer: Self-pay | Admitting: Internal Medicine

## 2022-09-12 DIAGNOSIS — E039 Hypothyroidism, unspecified: Secondary | ICD-10-CM

## 2022-09-16 ENCOUNTER — Encounter: Payer: Self-pay | Admitting: Oncology

## 2022-09-18 ENCOUNTER — Encounter: Payer: Self-pay | Admitting: Oncology

## 2022-09-19 ENCOUNTER — Encounter: Payer: Self-pay | Admitting: Physical Medicine and Rehabilitation

## 2022-09-19 ENCOUNTER — Telehealth: Payer: Self-pay | Admitting: Internal Medicine

## 2022-09-19 ENCOUNTER — Encounter: Payer: Self-pay | Admitting: Oncology

## 2022-09-19 ENCOUNTER — Encounter
Payer: Managed Care, Other (non HMO) | Attending: Physical Medicine and Rehabilitation | Admitting: Physical Medicine and Rehabilitation

## 2022-09-19 ENCOUNTER — Other Ambulatory Visit (INDEPENDENT_AMBULATORY_CARE_PROVIDER_SITE_OTHER): Payer: Managed Care, Other (non HMO)

## 2022-09-19 VITALS — BP 133/82 | HR 55 | Ht 69.0 in

## 2022-09-19 DIAGNOSIS — G8250 Quadriplegia, unspecified: Secondary | ICD-10-CM | POA: Diagnosis present

## 2022-09-19 DIAGNOSIS — Z993 Dependence on wheelchair: Secondary | ICD-10-CM | POA: Insufficient documentation

## 2022-09-19 DIAGNOSIS — I63521 Cerebral infarction due to unspecified occlusion or stenosis of right anterior cerebral artery: Secondary | ICD-10-CM | POA: Insufficient documentation

## 2022-09-19 DIAGNOSIS — C7940 Secondary malignant neoplasm of unspecified part of nervous system: Secondary | ICD-10-CM | POA: Insufficient documentation

## 2022-09-19 DIAGNOSIS — C7982 Secondary malignant neoplasm of genital organs: Secondary | ICD-10-CM

## 2022-09-19 DIAGNOSIS — N319 Neuromuscular dysfunction of bladder, unspecified: Secondary | ICD-10-CM | POA: Insufficient documentation

## 2022-09-19 DIAGNOSIS — E039 Hypothyroidism, unspecified: Secondary | ICD-10-CM | POA: Diagnosis not present

## 2022-09-19 DIAGNOSIS — K592 Neurogenic bowel, not elsewhere classified: Secondary | ICD-10-CM

## 2022-09-19 DIAGNOSIS — I69315 Cognitive social or emotional deficit following cerebral infarction: Secondary | ICD-10-CM

## 2022-09-19 DIAGNOSIS — C61 Malignant neoplasm of prostate: Secondary | ICD-10-CM | POA: Diagnosis present

## 2022-09-19 NOTE — Progress Notes (Signed)
Subjective:    Patient ID: Jason [name], male    DOB: 02-04-1956, 67 y.o.   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    Added mucinex to meds-   Things good! Standing at sink- H/H has hi standing-  Longest stood was ~12 minutes And yesterday stood >10 minutes.   OT has to find things to challenge him.  Gets something and masters in the next week.  Uses chair pad to pull in seated position- sitting up straighter.      Using [facility] for PT-  Likes [facility] a lot better.   Spot on buttocks almost gone- neosporin BID and barrier cream at night and to a pink lotion in AM- calmoseptine- has calamine in it- and some zinc oxide.  From head of penis = had a yeast spot- works for both.   Seeing Urology- Dr Brantley Stage- seeing in August- still has foley- changed  Going to go to [facility]- in near future.    A PT thought Hip replacement had popped out-  Also they noticed dropping beats- had heart monitor for 2 weeks-  Cards wasn't real concerned based on monitor data.  Sees Cards in [place] and with electrophysiology-    Sees Dr Cathie Hoops- in August- Oncology-  Dr Barbaraann Cao 10/19-  MRI got pushed to 10/31- so might change [facility] appt- to look at lumbar and cervical spine.   Heart rate running in 50s-   Watches a lot more TV and baseball.   Does a lot more exercising- and naps every so often- when stretches out for pressure relief, falls asleep regular- 2 naps/day and goes to bed at 5pm and also naps then- if wife goes ot gym.   Chemo med makes him tired.   Throat- sounds scratching/coarse- wondering related to SCI.    Hands stay cold- and unfortunately it's likely not going to get better.   Started Lisinopril again- for last few months.   No  UTIs since saw me last.  Using silicone foley's lately- - because allergic to plastic in foley- an allergic reaction- so had blood at penis. The once last 2 times causing bleeding- so whatever material being used is causing allergic reaction.      Pain Inventory Average Pain 2 Pain Right Now 0 My pain is intermittent and dull  In the last 24 hours, has pain interfered with the following? General activity 0 Relation with others 0 Enjoyment of life 0 What TIME of day is your pain at its worst? evening Sleep (in general) Good  Pain is worse with: standing Pain improves with: therapy/exercise Relief from Meds: 9  Family History  Problem Relation Age of Onset   Hypertension Mother    Stroke Mother    Gout Father    Heart attack Father 69   Heart disease Sister    Lung cancer Sister    Drug abuse Sister    Breast cancer Paternal Aunt    Lung cancer Maternal Grandmother    Colon cancer Neg Hx    Esophageal cancer Neg Hx    Rectal cancer Neg Hx    Stomach cancer Neg Hx    Social History   Socioeconomic History   Marital status: Married    Spouse  name: Delray Alt    Number of children: 0   Years of education: Not on file   Highest education level: Bachelor's degree (e.g., BA, AB, BS)  Occupational History   Occupation: Warden/ranger: [employer]  Tobacco Use   Smoking status: Never    Passive exposure: Never   Smokeless tobacco: Never  Vaping Use   Vaping status: Never Used  Substance and Sexual Activity   Alcohol use: Not Currently    Comment: Alcohol once every few weeks/months   Drug use: No   Sexual activity: Not Currently  Other Topics Concern   Not on file  Social History Narrative   Lives at home with spouse.    Social Determinants of Health   Financial Resource Strain: Low Risk  (06/30/2022)   Overall Financial Resource Strain (CARDIA)    Difficulty of Paying Living Expenses: Not hard at all  Food Insecurity: No Food Insecurity (06/30/2022)    Hunger Vital Sign    Worried About Running Out of Food in the Last Year: Never true    Ran Out of Food in the Last Year: Never true  Transportation Needs: No Transportation Needs (06/30/2022)   PRAPARE - Administrator, Civil Service (Medical): No    Lack of Transportation (Non-Medical): No  Physical Activity: Unknown (06/30/2022)   Exercise Vital Sign    Days of Exercise per Week: 0 days    Minutes of Exercise per Session: Not on file  Stress: No Stress Concern Present (06/30/2022)   [employer] of Occupational Health - Occupational Stress Questionnaire    Feeling of Stress : Only a little  Social Connections: Moderately Integrated (06/30/2022)   Social Connection and Isolation Panel [NHANES]    Frequency of Communication with Friends and Family: Three times a week    Frequency of Social Gatherings with Friends and Family: More than three times a week    Attends Religious Services: Never    Database administrator or Organizations: Yes    Attends Banker Meetings: 1 to 4 times per year    Marital Status: Married   Past Surgical History:  Procedure Laterality Date   CARDIAC CATHETERIZATION  03/2000   HERNIA REPAIR  1961   umbilical   LEFT HEART CATH AND CORONARY ANGIOGRAPHY Left 03/20/2020   Procedure: LEFT HEART CATH AND CORONARY ANGIOGRAPHY;  Surgeon: Yvonne Kendall, MD;  Location: [facility] INVASIVE CV LAB;  Service: Cardiovascular;  Laterality: Left;   POSTERIOR CERVICAL FUSION/FORAMINOTOMY N/A 10/06/2021   Procedure: POSTERIOR CERVICAL FUSION/ FORAMINOTOMY LEVEL 3;  Surgeon: Venetia Night, MD;  Location: [facility] ORS;  Service: Neurosurgery;  Laterality: N/A;  will need monitoring   RADIOLOGY WITH ANESTHESIA Left 04/18/2021   Procedure: MRI SHOULDER WITHOUT CONTRAST WITH ANESTHESIA;  Surgeon: Radiologist, Medication, MD;  [facility];  Service: Radiology;  Laterality: Left;   RADIOLOGY WITH ANESTHESIA N/A 10/03/2021   Procedure: MRI CERVICAL SPINE WITH  ANESTHESIA;  Surgeon: Radiologist, Medication, MD;  [facility];  Service: Radiology;  Laterality: N/A;   RADIOLOGY WITH ANESTHESIA N/A 01/30/2022   Procedure: MRI CERVICAL SPINE WITH AND WITOHOUT CONTRAST WITH ANESTHESIA;  Surgeon: Radiologist, Medication, MD;  [facility];  Service: Radiology;  Laterality: N/A;   RADIOLOGY WITH ANESTHESIA N/A 05/22/2022   Procedure: MRI LUMBER SPINE AND CERVIC SPINE WITH AND WITHOUT CONTRAST;  Surgeon: Radiologist, Medication, MD;  [facility];  Service: Radiology;  Laterality: N/A;   REVERSE SHOULDER ARTHROPLASTY Left  ROTATOR CUFF REPAIR  11/2010   Dr Hyacinth Meeker   TONSILLECTOMY     TOTAL HIP ARTHROPLASTY Right 02/24/2009   TOTAL HIP ARTHROPLASTY Left 06/2016   Dr Odis Luster   Past Surgical History:  Procedure Laterality Date   CARDIAC CATHETERIZATION  03/2000   HERNIA REPAIR  1961   umbilical   LEFT HEART CATH AND CORONARY ANGIOGRAPHY Left 03/20/2020   Procedure: LEFT HEART CATH AND CORONARY ANGIOGRAPHY;  Surgeon: Yvonne Kendall, MD;  Location: [facility] INVASIVE CV LAB;  Service: Cardiovascular;  Laterality: Left;   POSTERIOR CERVICAL FUSION/FORAMINOTOMY N/A 10/06/2021   Procedure: POSTERIOR CERVICAL FUSION/ FORAMINOTOMY LEVEL 3;  Surgeon: Venetia Night, MD;  Location: [facility] ORS;  Service: Neurosurgery;  Laterality: N/A;  will need monitoring   RADIOLOGY WITH ANESTHESIA Left 04/18/2021   Procedure: MRI SHOULDER WITHOUT CONTRAST WITH ANESTHESIA;  Surgeon: Radiologist, Medication, MD;  [facility];  Service: Radiology;  Laterality: Left;   RADIOLOGY WITH ANESTHESIA N/A 10/03/2021   Procedure: MRI CERVICAL SPINE WITH ANESTHESIA;  Surgeon: Radiologist, Medication, MD;  [facility];  Service: Radiology;  Laterality: N/A;   RADIOLOGY WITH ANESTHESIA N/A 01/30/2022   Procedure: MRI CERVICAL SPINE WITH AND WITOHOUT CONTRAST WITH ANESTHESIA;  Surgeon: Radiologist, Medication, MD;  [facility];  Service: Radiology;  Laterality: N/A;   RADIOLOGY  WITH ANESTHESIA N/A 05/22/2022   Procedure: MRI LUMBER SPINE AND CERVIC SPINE WITH AND WITHOUT CONTRAST;  Surgeon: Radiologist, Medication, MD;  [facility];  Service: Radiology;  Laterality: N/A;   REVERSE SHOULDER ARTHROPLASTY Left    ROTATOR CUFF REPAIR  11/2010   Dr Hyacinth Meeker   TONSILLECTOMY     TOTAL HIP ARTHROPLASTY Right 02/24/2009   TOTAL HIP ARTHROPLASTY Left 06/2016   Dr Odis Luster   Past Medical History:  Diagnosis Date   Allergy    Arthritis    osteoarthritis   GERD (gastroesophageal reflux disease)    History of kidney stones    Hypertension    Hypothyroidism    Palpitations 2022   Pneumonia    Stroke [facility])    Mini stroke - Aug 2023   Thyroid disease    Hyperthyroidism s/p radioactive iodine ablation   Ht 5\' 9"  (1.753 m)   BMI 26.43 kg/m   Opioid Risk Score:   Fall Risk Score:  `1  Depression screen PHQ 2/9     12/02/2021    1:20 PM 10/15/2020    3:41 PM 10/10/2019    3:10 PM 10/05/2018    7:54 AM 08/28/2017    8:28 AM  Depression screen PHQ 2/9  Decreased Interest 0 0 0 0 0  Down, Depressed, Hopeless 1 0 0 0 0  PHQ - 2 Score 1 0 0 0 0  Altered sleeping 0      Tired, decreased energy 1      Change in appetite 0      Feeling bad or failure about yourself  0      Trouble concentrating 0      Moving slowly or fidgety/restless 0      Suicidal thoughts 0      PHQ-9 Score 2        Review of Systems  All other systems reviewed and are negative.      Objective:   Physical Exam  Awake, alert, slightly delayed responses; in power w/c;  accompanied by wife, NAD Wearing TEDs-  Has foley-  Wearing TEDS to knee B/L Doesn't wear pants.  Stat lock on foley appropriate Has latex/silicone  foley currently.   MS: Ue's RUE- biceps 4+/5; triceps 4+/5; WE 4+/5; grip 4-/5 and FA 4/5 LUE- deltoid 2/5; biceps 4/5; triceps 4/5 WE 4/5; grip 4-/5 and FA 4/5 RLE- HF 2-/5; KE 2/5; DF 2/5; and PF 2+/5 LLE- HF 2/5; KE 3+/5; DF 3-/5 and PF 3+/5  Abrasion on R side of  mouth- from shaving, and skin discoloration R temple.   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   Con't H/H for therapies  2.  Suggest checking if allergic to silicone vs plastic vs latex-  for foley- speak to Urology.   Foley last changed 09/11/22.  -not as much give in foley/with stat lock? 3.    Doing pressure relief q20-30 minutes- continue to do so.   4. Con't wound care- as doing-  5.   Let me know when needs to re-up if possible.   6. Will need to Urology for Suprapubic to be changed- will get in August/September- , hopefully.   7. Turns 67 in October 2024-- Discussed original medicare- managed medicare usually require a lot of prior authorizations- suggest medicare with AARP or some other supplement to reduce costs- and meds/via donut hole. Also reduces prior authorizations which can delay care. .   8.  F/U in 4 months double appt. SCI   I spent a total of 42    minutes on total care today- >50% coordination of care- due to  Discussion about foley/SPC, and Medicare- vs managed care; pressure ulcer relief.  And what's going on medically.

## 2022-09-19 NOTE — Patient Instructions (Signed)
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   Con't H/H for therapies  2.  Suggest checking if allergic to silicone vs plastic vs latex-  for foley- speak to Urology.   Foley last changed 09/11/22.  -not as much give in foley/with stat lock? 3.    Doing pressure relief q20-30 minutes- continue to do so.   4. Con't wound care- as doing-  5.   Let me know when needs to re-up if possible.   6. Will need to Urology for Suprapubic to be changed- will get in August/September- , hopefully.   7. Turns 67 in October 2024-- Discussed original medicare- managed medicare usually require a lot of prior authorizations- suggest medicare with AARP or some other supplement to reduce costs- and meds/via donut hole. Also reduces prior authorizations which can delay care. .   8.  F/U in 3 months double appt.

## 2022-09-19 NOTE — Telephone Encounter (Signed)
Type of forms received: Health Screening  Routed ZO:[id number] Pool  Paperwork received by : Lesly Rubenstein    Individual made aware of 3-5 business day turn around (Y/N): Y  Form completed and patient made aware of charges(Y/N): Y   Faxed to :   Form location: Place in PCP [employer]

## 2022-09-23 ENCOUNTER — Encounter: Payer: Self-pay | Admitting: Oncology

## 2022-09-23 NOTE — Telephone Encounter (Signed)
Form retrieved from front folder and filled out. Questions sent to pt and wife in a MyChart message.

## 2022-09-24 ENCOUNTER — Ambulatory Visit: Payer: Managed Care, Other (non HMO) | Attending: Medical | Admitting: Medical

## 2022-09-24 ENCOUNTER — Encounter: Payer: Self-pay | Admitting: Oncology

## 2022-09-24 ENCOUNTER — Encounter: Payer: Self-pay | Admitting: Medical

## 2022-09-24 VITALS — BP 126/60 | HR 55 | Resp 18

## 2022-09-24 DIAGNOSIS — I493 Ventricular premature depolarization: Secondary | ICD-10-CM | POA: Diagnosis not present

## 2022-09-24 DIAGNOSIS — I4729 Other ventricular tachycardia: Secondary | ICD-10-CM

## 2022-09-24 DIAGNOSIS — E782 Mixed hyperlipidemia: Secondary | ICD-10-CM | POA: Diagnosis not present

## 2022-09-24 DIAGNOSIS — I10 Essential (primary) hypertension: Secondary | ICD-10-CM | POA: Diagnosis not present

## 2022-09-24 DIAGNOSIS — Z86718 Personal history of other venous thrombosis and embolism: Secondary | ICD-10-CM

## 2022-09-24 NOTE — Patient Instructions (Signed)
Medication Instructions:  Your physician recommends that you continue on your current medications as directed. Please refer to the Current Medication list given to you today.  *If you need a refill on your cardiac medications before your next appointment, please call your pharmacy*   Lab Work: None ordered today   Testing/Procedures: Your physician has requested that you have an limited echocardiogram. Echocardiography is a painless test that uses sound waves to create images of your heart. It provides your doctor with information about the size and shape of your heart and how well your heart's chambers and valves are working.   You may receive an ultrasound enhancing agent through an IV if needed to better visualize your heart during the echo. This procedure takes approximately one hour.  There are no restrictions for this procedure.  This will take place at [address] [address], [place] [address]    Follow-Up: At [facility], you and your health needs are our priority.  As part of our continuing mission to provide you with exceptional heart care, we have created designated Provider Care Teams.  These Care Teams include your primary Cardiologist (physician) and Advanced Practice Providers (APPs -  Physician Assistants and Nurse Practitioners) who all work together to provide you with the care you need, when you need it.  We recommend signing up for the patient portal called "MyChart".  Sign up information is provided on this After Visit Summary.  MyChart is used to connect with patients for Virtual Visits (Telemedicine).  Patients are able to view lab/test results, encounter notes, upcoming appointments, etc.  Non-urgent messages can be sent to your provider as well.   To learn more about what you can do with MyChart, go to [email or web address].    Your next appointment:   3 month(s)  Provider:   Yvonne Kendall, MD

## 2022-09-24 NOTE — Progress Notes (Signed)
Cardiology Office Note:    Date:  09/24/2022   ID:  Jason [name], DOB 01-08-1956, MRN [id number]  PCP:  Karie Schwalbe, MD  [facility] Cardiologist:  Yvonne Kendall, MD  [facility] Electrophysiologist:  None   Referring MD: Karie Schwalbe, MD  Chief Complaint: 1 month follow-up  History of Present Illness:    Jason [name] is a 67 y.o. male with a hx of metastatic prostate cancer, h/o stroke, HTN, thyroid disease, GERD, osteoarthritis, and PVCs who presents for 1 month follow-up.  The patient underwent remote stress test in 2002 which led to cardiac cath that showed no significant CAD.    The patient was evaluated for palpitations in 2022. He underwent cardiac cath 02/2020 that showed no significant CAD, normal LV function with mildly elevated filling pressures consistent with diastolic dysfunction. He was started on metorpolol for PVCs.    Seen in the office 03/30/20 reporting palpitations. A heart monitor was ordered that showed SR, rare PVC/PACs, NSVT, PSVT. He was placed on Toprol. Echo 06/2020 showed LVEF 50-55%, no WMA, mild LVH, G1DD, RVSF, normal RV size and function.  Patient was last seen in June 2024 reporting low heart rate into the 30s and 40s.  EKG showed normal sinus rhythm, heart rate 60 to 70s with frequent PVCs.  A 2-week heart monitor was ordered and labs were drawn.  Heart monitor showed predominantly normal sinus rhythm with an average heart rate of 50, rare PACs and frequent PVCs 15.8% burden, 6 episodes of nonsustained VT, 11 runs of SVT, lasting up to 14 seconds.  Today, the patient is overall doing well. Patient denies any heart racing or fluttering. He does PT and HR can increase at this time. No lower leg edema. He has an appointment with EP in 2 weeks. No chest pain or SOB.   Past Medical History:  Diagnosis Date   Allergy    Arthritis    osteoarthritis   GERD (gastroesophageal reflux disease)    History of kidney stones    Hypertension     Hypothyroidism    Palpitations 2022   Pneumonia    Stroke [facility])    Mini stroke - Aug 2023   Thyroid disease    Hyperthyroidism s/p radioactive iodine ablation    Past Surgical History:  Procedure Laterality Date   CARDIAC CATHETERIZATION  03/2000   HERNIA REPAIR  1961   umbilical   LEFT HEART CATH AND CORONARY ANGIOGRAPHY Left 03/20/2020   Procedure: LEFT HEART CATH AND CORONARY ANGIOGRAPHY;  Surgeon: Yvonne Kendall, MD;  Location: [facility] INVASIVE CV LAB;  Service: Cardiovascular;  Laterality: Left;   POSTERIOR CERVICAL FUSION/FORAMINOTOMY N/A 10/06/2021   Procedure: POSTERIOR CERVICAL FUSION/ FORAMINOTOMY LEVEL 3;  Surgeon: Venetia Night, MD;  Location: [facility] ORS;  Service: Neurosurgery;  Laterality: N/A;  will need monitoring   RADIOLOGY WITH ANESTHESIA Left 04/18/2021   Procedure: MRI SHOULDER WITHOUT CONTRAST WITH ANESTHESIA;  Surgeon: Radiologist, Medication, MD;  Location: [facility] OR;  Service: Radiology;  Laterality: Left;   RADIOLOGY WITH ANESTHESIA N/A 10/03/2021   Procedure: MRI CERVICAL SPINE WITH ANESTHESIA;  Surgeon: Radiologist, Medication, MD;  Location: [facility] OR;  Service: Radiology;  Laterality: N/A;   RADIOLOGY WITH ANESTHESIA N/A 01/30/2022   Procedure: MRI CERVICAL SPINE WITH AND WITOHOUT CONTRAST WITH ANESTHESIA;  Surgeon: Radiologist, Medication, MD;  Location: [facility] OR;  Service: Radiology;  Laterality: N/A;   RADIOLOGY WITH ANESTHESIA N/A 05/22/2022   Procedure: MRI LUMBER SPINE AND CERVIC SPINE WITH AND WITHOUT  CONTRAST;  Surgeon: Radiologist, Medication, MD;  Location: [facility] OR;  Service: Radiology;  Laterality: N/A;   REVERSE SHOULDER ARTHROPLASTY Left    ROTATOR CUFF REPAIR  11/2010   Dr Hyacinth Meeker   TONSILLECTOMY     TOTAL HIP ARTHROPLASTY Right 02/24/2009   TOTAL HIP ARTHROPLASTY Left 06/2016   Dr Odis Luster    Current Medications: Current Meds  Medication Sig   acetaminophen (TYLENOL) 325 MG tablet Take 1-2 tablets (325-650 mg total) by mouth every 4 (four) hours as  needed for mild pain. (Patient taking differently: Take 1,000 mg by mouth 2 (two) times daily.)   ascorbic acid (VITAMIN C) 500 MG tablet Take 1 tablet (500 mg total) by mouth daily. (Patient taking differently: Take 1,000 mg by mouth daily.)   aspirin EC 81 MG tablet Take 1 tablet (81 mg total) by mouth daily. Swallow whole. (Patient taking differently: Take 162 mg by mouth in the morning. Swallow whole.)   atorvastatin (LIPITOR) 40 MG tablet Take 1 tablet (40 mg total) by mouth daily.   cetirizine (ZYRTEC) 10 MG tablet Take 10 mg by mouth at bedtime.   clonazePAM (KLONOPIN) 0.5 MG tablet Take 1 tablet (0.5 mg total) by mouth 2 (two) times daily as needed for anxiety.   CRANBERRY PO Take 4,200 mg by mouth 2 (two) times daily. W/Vit C   enzalutamide (XTANDI) 40 MG tablet Take 4 tablets (160 mg total) by mouth daily.   famotidine (PEPCID) 20 MG tablet Take 1 tablet (20 mg total) by mouth daily. (Patient taking differently: Take 20 mg by mouth at bedtime.)   FLUoxetine (PROZAC) 20 MG capsule Take 1 capsule (20 mg total) by mouth at bedtime.   fluticasone (FLONASE) 50 MCG/ACT nasal spray Place 1 spray into both nostrils daily.   levothyroxine (SYNTHROID) 88 MCG tablet Take 1 tablet (88 mcg total) by mouth daily.   lisinopril (ZESTRIL) 10 MG tablet Take 1 tablet (10 mg total) by mouth daily.   metoprolol succinate (TOPROL-XL) 50 MG 24 hr tablet Take 1 tablet (50 mg total) by mouth at bedtime. Take with or immediately following a meal.   Multiple Vitamin (MULTIVITAMIN) tablet Take 1 tablet by mouth daily.   potassium chloride (KLOR-CON M) 10 MEQ tablet Take 1 tablet (10 mEq total) by mouth at bedtime.   sennosides-docusate sodium (SENOKOT-S) 8.6-50 MG tablet Take 2 tablets by mouth daily. (Patient taking differently: Take 2 tablets by mouth daily with lunch.)   tamsulosin (FLOMAX) 0.4 MG CAPS capsule Take 1 capsule (0.4 mg total) by mouth daily after supper. (Patient taking differently: Take 0.4 mg by  mouth in the morning.)     Allergies:   Patient has no known allergies.   Social History   Socioeconomic History   Marital status: Married    Spouse name: [name]    Number of children: 0   Years of education: Not on file   Highest education level: Bachelor's degree (e.g., BA, AB, BS)  Occupational History   Occupation: Warden/ranger: [employer]  Tobacco Use   Smoking status: Never    Passive exposure: Never   Smokeless tobacco: Never  Vaping Use   Vaping status: Never Used  Substance and Sexual Activity   Alcohol use: Not Currently    Comment: Alcohol once every few weeks/months   Drug use: No   Sexual activity: Not Currently  Other Topics Concern   Not on file  Social History Narrative   Lives at home with spouse.  Social Determinants of Health   Financial Resource Strain: Low Risk  (06/30/2022)   Overall Financial Resource Strain (CARDIA)    Difficulty of Paying Living Expenses: Not hard at all  Food Insecurity: No Food Insecurity (06/30/2022)   Hunger Vital Sign    Worried About Running Out of Food in the Last Year: Never true    Ran Out of Food in the Last Year: Never true  Transportation Needs: No Transportation Needs (06/30/2022)   PRAPARE - Administrator, Civil Service (Medical): No    Lack of Transportation (Non-Medical): No  Physical Activity: Unknown (06/30/2022)   Exercise Vital Sign    Days of Exercise per Week: 0 days    Minutes of Exercise per Session: Not on file  Stress: No Stress Concern Present (06/30/2022)   [employer] of Occupational Health - Occupational Stress Questionnaire    Feeling of Stress : Only a little  Social Connections: Moderately Integrated (06/30/2022)   Social Connection and Isolation Panel [NHANES]    Frequency of Communication with Friends and Family: Three times a week    Frequency of Social Gatherings with Friends and Family: More than three times a week    Attends Religious Services: Never     Database administrator or Organizations: Yes    Attends Engineer, structural: 1 to 4 times per year    Marital Status: Married     Family History: The patient's family history includes Breast cancer in his paternal aunt; Drug abuse in his sister; Gout in his father; Heart attack (age of onset: 21) in his father; Heart disease in his sister; Hypertension in his mother; Lung cancer in his maternal grandmother and sister; Stroke in his mother. There is no history of Colon cancer, Esophageal cancer, Rectal cancer, or Stomach cancer.  ROS:   Please see the history of present illness.     All other systems reviewed and are negative.  EKGs/Labs/Other Studies Reviewed:    The following studies were reviewed today:  Heart monitor 08/2022   The patient was monitored for 14 days; only 9 days, 20 hours were suitable for analysis.   The predominant rhythm was sinus with an average rate of 50 bpm in sinus (range 39-81 bpm).   Rare PAC's and frequent PVC's were observed (PVC burden 15.8%).   6 episodes of nonsustained ventricular tachycardia occurred, lasting up to 7 beats with a maximum rate of 139 bpm.   11 supraventricular runs were noted, lasting up to 14.0 seconds with a maximum rate of 126 bpm.   No sustained arrhythmia or prolonged pause was seen.   There were no patient triggered events.   Predominantly sinus rhythm with frequent PVC's (15.8% burden) and several episodes of NSVT and PSVT, as detailed above.  Echo limited bubble study 09/2021 1. Left ventricular ejection fraction, by estimation, is 60 to 65%. The  left ventricle has normal function. The left ventricle has no regional  wall motion abnormalities. Left ventricular diastolic parameters are  indeterminate.   2. Right ventricular systolic function is normal. The right ventricular  size is normal   3. Agitated saline contrast bubble study was negative, with no evidence  of any interatrial shun   4. Limited study   Echo  09/2021 1. Left ventricular ejection fraction, by estimation, is 55 to 60%. The  left ventricle has normal function. The left ventricle has no regional  wall motion abnormalities. Left ventricular diastolic parameters were  normal.  2. Right ventricular systolic function is normal. The right ventricular  size is normal.   3. The mitral valve is normal in structure. Mild mitral valve  regurgitation. No evidence of mitral stenosis.   4. The aortic valve is normal in structure. Aortic valve regurgitation is  not visualized. Mild aortic valve stenosis.   5. The inferior vena cava is normal in size with greater than 50%  respiratory variability, suggesting right atrial pressure of 3 mmHg.   EKG:  EKG is not ordered today.    Recent Labs: 07/04/2022: ALT 14; Hemoglobin 13.9; Platelets 277 08/08/2022: Magnesium 2.2 09/19/2022: BUN 7; Creatinine, Ser 0.44; Potassium 4.5; Sodium 138; TSH 5.220  Recent Lipid Panel    Component Value Date/Time   CHOL 123 09/19/2022 1458   TRIG 74 09/19/2022 1458   HDL 52 09/19/2022 1458   CHOLHDL 2.4 09/19/2022 1458   CHOLHDL 3.9 07/25/2020 1657   VLDL 15 07/25/2020 1657   LDLCALC 56 09/19/2022 1458   LDLDIRECT 62 10/10/2021 0455     Physical Exam:    VS:  BP 126/60 (BP Location: Right Arm, Patient Position: Sitting, Cuff Size: Normal)   Pulse (!) 55   Resp 18   SpO2 97%     Wt Readings from Last 3 Encounters:  08/08/22 179 lb (81.2 kg)  06/27/22 179 lb (81.2 kg)  06/24/22 179 lb (81.2 kg)     GEN:  Well nourished, well developed in no acute distress HEENT: Normal NECK: No JVD; No carotid bruits LYMPHATICS: No lymphadenopathy CARDIAC: RRR, no murmurs, rubs, gallops RESPIRATORY:  Clear to auscultation without rales, wheezing or rhonchi  ABDOMEN: Soft, non-tender, non-distended MUSCULOSKELETAL:  No edema; No deformity  SKIN: Warm and dry NEUROLOGIC:  Alert and oriented x 3 PSYCHIATRIC:  Normal affect   ASSESSMENT:    1. PVC's (premature  ventricular contractions)   2. Essential hypertension   3. Hyperlipidemia, mixed   4. History of deep vein thrombosis (DVT) of lower extremity   5. NSVT (nonsustained ventricular tachycardia) (HCC)    PLAN:    In order of problems listed above:  PVCs Recent heart monitor showed PVC burden 15.8%. He is on Toprol 50mg  daily, cannot increase this with HR 55bpm. He has appointment with EP in 2 weeks. I will update an echocardiogram to assess pump function. Prior pump function has been normal. Continue Toprol 50mg  daily.   HTN BP is normal, continue lisinopril and Toprol.  HLD LDL 62, continue Lipitor 40mg  daily.   H/o DVT Eliquis was stopped by oncology as it interacts with chemotherapy drug. Continue Aspirin 81mg  daily.   NSVT 6 episodes on recent heart monitor. Cath in 2022 showed no CAD.  Disposition: Follow up in 3 month(s) with MD    Signed, Jann Ra David Stall, PA-C  09/24/2022 10:48 AM    [facility]

## 2022-10-01 ENCOUNTER — Other Ambulatory Visit: Payer: Self-pay | Admitting: Oncology

## 2022-10-01 DIAGNOSIS — C61 Malignant neoplasm of prostate: Secondary | ICD-10-CM

## 2022-10-03 ENCOUNTER — Encounter: Payer: Self-pay | Admitting: Oncology

## 2022-10-03 ENCOUNTER — Inpatient Hospital Stay: Payer: Managed Care, Other (non HMO) | Attending: Anatomic Pathology & Clinical Pathology

## 2022-10-03 ENCOUNTER — Inpatient Hospital Stay (HOSPITAL_BASED_OUTPATIENT_CLINIC_OR_DEPARTMENT_OTHER): Payer: Managed Care, Other (non HMO) | Admitting: Oncology

## 2022-10-03 VITALS — BP 130/84 | HR 55 | Temp 96.7°F | Resp 18

## 2022-10-03 DIAGNOSIS — Z9089 Acquired absence of other organs: Secondary | ICD-10-CM | POA: Diagnosis not present

## 2022-10-03 DIAGNOSIS — I700 Atherosclerosis of aorta: Secondary | ICD-10-CM | POA: Insufficient documentation

## 2022-10-03 DIAGNOSIS — R5383 Other fatigue: Secondary | ICD-10-CM | POA: Diagnosis not present

## 2022-10-03 DIAGNOSIS — Z79818 Long term (current) use of other agents affecting estrogen receptors and estrogen levels: Secondary | ICD-10-CM

## 2022-10-03 DIAGNOSIS — Z823 Family history of stroke: Secondary | ICD-10-CM | POA: Insufficient documentation

## 2022-10-03 DIAGNOSIS — C7940 Secondary malignant neoplasm of unspecified part of nervous system: Secondary | ICD-10-CM | POA: Diagnosis not present

## 2022-10-03 DIAGNOSIS — Z801 Family history of malignant neoplasm of trachea, bronchus and lung: Secondary | ICD-10-CM | POA: Insufficient documentation

## 2022-10-03 DIAGNOSIS — R531 Weakness: Secondary | ICD-10-CM | POA: Diagnosis not present

## 2022-10-03 DIAGNOSIS — Z8249 Family history of ischemic heart disease and other diseases of the circulatory system: Secondary | ICD-10-CM | POA: Diagnosis not present

## 2022-10-03 DIAGNOSIS — Z8673 Personal history of transient ischemic attack (TIA), and cerebral infarction without residual deficits: Secondary | ICD-10-CM | POA: Insufficient documentation

## 2022-10-03 DIAGNOSIS — M47816 Spondylosis without myelopathy or radiculopathy, lumbar region: Secondary | ICD-10-CM | POA: Insufficient documentation

## 2022-10-03 DIAGNOSIS — M4802 Spinal stenosis, cervical region: Secondary | ICD-10-CM | POA: Diagnosis not present

## 2022-10-03 DIAGNOSIS — I10 Essential (primary) hypertension: Secondary | ICD-10-CM | POA: Diagnosis not present

## 2022-10-03 DIAGNOSIS — C7951 Secondary malignant neoplasm of bone: Secondary | ICD-10-CM | POA: Diagnosis present

## 2022-10-03 DIAGNOSIS — M48061 Spinal stenosis, lumbar region without neurogenic claudication: Secondary | ICD-10-CM | POA: Insufficient documentation

## 2022-10-03 DIAGNOSIS — Z87442 Personal history of urinary calculi: Secondary | ICD-10-CM | POA: Insufficient documentation

## 2022-10-03 DIAGNOSIS — K219 Gastro-esophageal reflux disease without esophagitis: Secondary | ICD-10-CM | POA: Diagnosis not present

## 2022-10-03 DIAGNOSIS — C61 Malignant neoplasm of prostate: Secondary | ICD-10-CM | POA: Insufficient documentation

## 2022-10-03 DIAGNOSIS — Z86718 Personal history of other venous thrombosis and embolism: Secondary | ICD-10-CM | POA: Diagnosis not present

## 2022-10-03 DIAGNOSIS — Z814 Family history of other substance abuse and dependence: Secondary | ICD-10-CM | POA: Insufficient documentation

## 2022-10-03 DIAGNOSIS — Z79899 Other long term (current) drug therapy: Secondary | ICD-10-CM | POA: Insufficient documentation

## 2022-10-03 DIAGNOSIS — E039 Hypothyroidism, unspecified: Secondary | ICD-10-CM | POA: Diagnosis not present

## 2022-10-03 DIAGNOSIS — Z8349 Family history of other endocrine, nutritional and metabolic diseases: Secondary | ICD-10-CM | POA: Diagnosis not present

## 2022-10-03 LAB — CBC WITH DIFFERENTIAL (CANCER CENTER ONLY)
Abs Immature Granulocytes: 0.02 10*3/uL (ref 0.00–0.07)
Basophils Absolute: 0 10*3/uL (ref 0.0–0.1)
Basophils Relative: 1 %
Eosinophils Absolute: 0.1 10*3/uL (ref 0.0–0.5)
Eosinophils Relative: 1 %
HCT: 40.4 % (ref 39.0–52.0)
Hemoglobin: 13.5 g/dL (ref 13.0–17.0)
Immature Granulocytes: 0 %
Lymphocytes Relative: 12 %
Lymphs Abs: 0.9 10*3/uL (ref 0.7–4.0)
MCH: 33.3 pg (ref 26.0–34.0)
MCHC: 33.4 g/dL (ref 30.0–36.0)
MCV: 99.8 fL (ref 80.0–100.0)
Monocytes Absolute: 0.6 10*3/uL (ref 0.1–1.0)
Monocytes Relative: 7 %
Neutro Abs: 6.1 10*3/uL (ref 1.7–7.7)
Neutrophils Relative %: 79 %
Platelet Count: 240 10*3/uL (ref 150–400)
RBC: 4.05 MIL/uL — ABNORMAL LOW (ref 4.22–5.81)
RDW: 13.7 % (ref 11.5–15.5)
WBC Count: 7.7 10*3/uL (ref 4.0–10.5)
nRBC: 0 % (ref 0.0–0.2)

## 2022-10-03 LAB — PSA: Prostatic Specific Antigen: 2.11 ng/mL (ref 0.00–4.00)

## 2022-10-03 LAB — CMP (CANCER CENTER ONLY)
ALT: 19 U/L (ref 0–44)
AST: 20 U/L (ref 15–41)
Albumin: 3.7 g/dL (ref 3.5–5.0)
Alkaline Phosphatase: 73 U/L (ref 38–126)
Anion gap: 7 (ref 5–15)
BUN: 9 mg/dL (ref 8–23)
CO2: 28 mmol/L (ref 22–32)
Calcium: 8.9 mg/dL (ref 8.9–10.3)
Chloride: 99 mmol/L (ref 98–111)
Creatinine: 0.42 mg/dL — ABNORMAL LOW (ref 0.61–1.24)
GFR, Estimated: >60 mL/min (ref >60)
Glucose, Bld: 87 mg/dL (ref 70–99)
Potassium: 3.4 mmol/L — ABNORMAL LOW (ref 3.5–5.1)
Sodium: 134 mmol/L — ABNORMAL LOW (ref 135–145)
Total Bilirubin: 0.8 mg/dL (ref 0.3–1.2)
Total Protein: 6.3 g/dL — ABNORMAL LOW (ref 6.5–8.1)

## 2022-10-03 NOTE — Progress Notes (Signed)
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 Currently on chemoprevention therapy with Eligard.  Status post palliative radiation to spine. PMSA PET scan showed partial response.  Labs are reviewed and discussed with patient.  He tolerates Xtandi 160 mg daily, continue current regimen. Fatigue/brain fog could be due to CNS side effects. Manageable.   History of deep vein thrombosis (DVT) of lower extremity Provoked, off Eliquis due to interaction with Xtandi Continue Aspirin 162 mg daily for prophylaxis.  Androgen deprivation therapy On Eligard 45mg  Q6 months. Next due Dec 2024   Orders Placed This Encounter  Procedures   CBC with Differential (Cancer Center Only)    Standing Status:   Future    Standing Expiration Date:   10/03/2023   CMP (Cancer Center only)    Standing Status:   Future    Standing Expiration Date:   10/03/2023   PSA    Standing Status:   Future    Standing Expiration Date:   10/03/2023   Follow up 3 months.   All questions were answered. The patient knows to call the clinic with any problems, questions or concerns.  [name], MD, PhD [facility] Hematology Oncology 10/03/2022        HISTORY OF PRESENTING ILLNESS:  Jason [name] 67 y.o. male presents to establish care for metastatic prostate cancer I have reviewed his chart and materials related to his cancer extensively and collaborated history with the patient. 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  Associated epidural disease greater at the C6 where there is marked canal stenosis without cord compression.  Left greater than right foraminal effacement due to the extraosseous disease.   10/03/2021 Imaging   MRI left shoulder showed High-riding humeral head with massive full-thickness tear of the entire supraspinatus tendon and the anterior 50% of the infraspinatus tendon. Additional partial-thickness articular sided tearing of the posterior infraspinatus. Moderate to high-grade supraspinatus and infraspinatus muscle atrophy suggests these tears  are chronic. Partial-thickness tearing of the superior greater than inferior  aspects of the subscapularis tendon footprint. Mild subscapularis muscle atrophy.  Moderate degenerative changes of the acromioclavicular joint. Moderate glenohumeral cartilage degenerative changes.   10/05/2021 Imaging   CT chest abdomen pelvis with contrast showed  Moderate severity retroperitoneal and pelvic lymphadenopathy,consistent with metastatic disease. Small lytic areas at the levels of L4 and S1, with diffusely sclerotic changes involving the second and fourth right ribs, sacrum and left iliac bone. 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  Sigmoid diverticulosis. Aortic atherosclerosis   10/05/2021 Imaging   CT cervical spine without contrast Extensive heterogeneous sclerotic lesions throughout the cervical spine compatible with metastatic disease to the bone.  Mixed sclerotic and lytic changes in the posterior  elements at C5 and C6.Extraosseous tumor at  C5 and C6 is better appreciated on the MRI scan.   10/05/2021 Tumor Marker   PSA >1500   10/06/2021 Cancer Staging   Staging form: Prostate, AJCC 8th Edition - Clinical stage from 10/06/2021: Stage IVB (cN1, pM1c, PSA: 1500) - Signed by [name], MD on 12/04/2021 Stage prefix: Initial diagnosis Prostate specific antigen (PSA) range: 20 or greater   10/08/2021 Imaging   MRI thoracic spine and the lumbar spine with and without contrast 1. 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 2. 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 3. Additional epidural tumor along the posterior endplates at T9,T11, and T12 without high-grade spinal canal stenosis or cord compression. 4. Extensive epidural tumor in the lumbar spine is greatest in thickness at L1 without high-grade spinal canal stenosis at this level; however, superimposed on pre-existing degenerative changes lower in the lumbar spine results in severe spinal canal stenosis with cauda equina nerve root compression at L3-L4 and L4-L5. Multilevel neural foraminal stenosis is detailed above. 5. Postsurgical changes reflecting C6 laminectomy unchanged spinal canal stenosis compared to the postoperative study from 1 day prior.No definite cord signal abnormality. 6. Extramedullary fluid collection along the dorsal aspect of the cord beginning at T9-T10 extending into the lumbar spine is likely subdural in location but is of uncertain etiology; evolving blood products not excluded. The collection anteriorly displaces the cord and cauda equina nerve roots without frank cord compression or signal abnormality. There is no peripheral enhancement to suggest abscess. 7. Prevertebral edema in the cervical spine which may be postsurgical in nature. 8. New bilateral lower  lobe consolidations may reflect atelectasis or developing pneumonia/aspiration.   10/08/2021 Imaging   MRI brain with and without contrast showed 1. 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 There is intracranial extension of tumor along the right frontal convexity measuring up to 5 mm in thickness without mass effect on the underlying brain parenchyma or midline shift. Extracranial extension of tumor in this location measures up to approximately 3 mm. 2. Additional extracranial extension of tumor into the scalp along the vertex measuring up to 7 mm in thickness. 3. Small focus of diffusion restriction in the right parietal cortex most in keeping with a small acute infarct. 4. No evidence of parenchymal metastatic disease.   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 10/06/2021, patient underwent C6 cervical laminectomy and resection of tumor.  Pathology came back positive for metastatic prostate cancer. 8/14 developed significant bilateral upper and lower extremity weakness with hypotension requiring transfer to the ICU, pressors, steroid, respiratory failure on mechanical ventilation. Later patient was stabilized and extubated.  8/16 Firmagon loading dose 240mg   9/15 Eligard 22.5mg  given inpatient during his rehab admission.     10/2021 -  Radiation Therapy   Palliative radiation to spine.    12/04/2021 Tumor Marker   PSA 67.09   01/13/2022 Tumor Marker   PSA 43.95   01/23/2022 -  Chemotherapy   Started on Xtandi 160mg  daily.     INTERVAL HISTORY Jason [name] is a 68 y.o. male who has above history reviewed by me today presents for follow up visit for metastatic prostate cancer. Patient was accompanied by wife.  Patient has chronic bilateral lower extremity extremity weakness, generalized left upper extremity weakness.  Marland Kitchen   He follows up with neurology. He takes [place] 160mg  daily.overall tolerates well, + fatigued, brain fog, fatigue, unchanged.  No new complaints. He continues physical therapy and has made some progress.   MEDICAL HISTORY:  Past Medical History:  Diagnosis Date   Allergy    Arthritis    osteoarthritis   GERD (gastroesophageal reflux disease)    History of kidney stones    Hypertension    Hypothyroidism    Palpitations 2022   Pneumonia    Stroke [facility])    Mini stroke - Aug 2023   Thyroid disease    Hyperthyroidism s/p radioactive iodine ablation    SURGICAL HISTORY: Past Surgical History:  Procedure Laterality Date   CARDIAC CATHETERIZATION  03/2000   HERNIA REPAIR  1961   umbilical   LEFT HEART CATH AND CORONARY ANGIOGRAPHY Left 03/20/2020   Procedure: LEFT HEART CATH AND CORONARY ANGIOGRAPHY;  Surgeon: Yvonne Kendall, MD;  Location: [facility] INVASIVE CV LAB;  Service: Cardiovascular;  Laterality: Left;   POSTERIOR CERVICAL FUSION/FORAMINOTOMY N/A 10/06/2021   Procedure: POSTERIOR CERVICAL FUSION/ FORAMINOTOMY LEVEL 3;  Surgeon: Venetia Night, MD;  Location: [facility] ORS;  Service: Neurosurgery;  Laterality: N/A;  will need monitoring   RADIOLOGY WITH ANESTHESIA Left 04/18/2021   Procedure: MRI SHOULDER WITHOUT CONTRAST WITH ANESTHESIA;  Surgeon: Radiologist, Medication, MD;  Location: [facility] OR;  Service: Radiology;  Laterality: Left;   RADIOLOGY WITH ANESTHESIA N/A 10/03/2021   Procedure: MRI CERVICAL SPINE WITH ANESTHESIA;  Surgeon: Radiologist, Medication, MD;  Location: [facility] OR;  Service: Radiology;  Laterality: N/A;   RADIOLOGY WITH ANESTHESIA N/A 01/30/2022   Procedure: MRI CERVICAL SPINE WITH AND WITOHOUT CONTRAST WITH ANESTHESIA;  Surgeon: Radiologist, Medication, MD;  Location: [facility] OR;  Service: Radiology;  Laterality: N/A;   RADIOLOGY WITH ANESTHESIA N/A 05/22/2022   Procedure: MRI LUMBER SPINE AND CERVIC SPINE WITH AND WITHOUT CONTRAST;  Surgeon: Radiologist, Medication, MD;   Location: [facility] OR;  Service: Radiology;  Laterality: N/A;   REVERSE SHOULDER ARTHROPLASTY Left    ROTATOR CUFF REPAIR  11/2010   Dr Hyacinth Meeker   TONSILLECTOMY     TOTAL HIP ARTHROPLASTY Right 02/24/2009   TOTAL HIP ARTHROPLASTY Left 06/2016   Dr Odis Luster    SOCIAL HISTORY: Social History   Socioeconomic History   Marital status: Married    Spouse name: [name]    Number of children: 0   Years of education: Not on file   Highest education level: Bachelor's degree (e.g., BA, AB, BS)  Occupational History   Occupation: Warden/ranger: [employer]  Tobacco Use   Smoking status: Never    Passive exposure: Never   Smokeless tobacco: Never  Vaping Use   Vaping status: Never Used  Substance and Sexual Activity   Alcohol use: Not Currently    Comment: Alcohol once every few weeks/months   Drug use: No   Sexual activity: Not Currently  Other Topics Concern   Not on file  Social History Narrative   Lives at home with spouse.    Social Determinants of Health   Financial Resource Strain: Low Risk  (06/30/2022)   Overall Financial Resource Strain (CARDIA)    Difficulty of Paying Living Expenses: Not hard at all  Food Insecurity: No Food Insecurity (06/30/2022)   Hunger Vital Sign    Worried About Running Out of Food in the Last Year: Never true    Ran  Out of Food in the Last Year: Never true  Transportation Needs: No Transportation Needs (06/30/2022)   PRAPARE - Administrator, Civil Service (Medical): No    Lack of Transportation (Non-Medical): No  Physical Activity: Unknown (06/30/2022)   Exercise Vital Sign    Days of Exercise per Week: 0 days    Minutes of Exercise per Session: Not on file  Stress: No Stress Concern Present (06/30/2022)   [employer] of Occupational Health - Occupational Stress Questionnaire    Feeling of Stress : Only a little  Social Connections: Moderately Integrated (06/30/2022)   Social Connection and Isolation Panel [NHANES]     Frequency of Communication with Friends and Family: Three times a week    Frequency of Social Gatherings with Friends and Family: More than three times a week    Attends Religious Services: Never    Database administrator or Organizations: Yes    Attends Engineer, structural: 1 to 4 times per year    Marital Status: Married  Catering manager Violence: Not on file    FAMILY HISTORY: Family History  Problem Relation Age of Onset   Hypertension Mother    Stroke Mother    Gout Father    Heart attack Father 39   Heart disease Sister    Lung cancer Sister    Drug abuse Sister    Breast cancer Paternal Aunt    Lung cancer Maternal Grandmother    Colon cancer Neg Hx    Esophageal cancer Neg Hx    Rectal cancer Neg Hx    Stomach cancer Neg Hx     ALLERGIES:  has No Known Allergies.  MEDICATIONS:  Current Outpatient Medications  Medication Sig Dispense Refill   acetaminophen (TYLENOL) 325 MG tablet Take 1-2 tablets (325-650 mg total) by mouth every 4 (four) hours as needed for mild pain. (Patient taking differently: Take 1,000 mg by mouth 2 (two) times daily.)     ascorbic acid (VITAMIN C) 500 MG tablet Take 1 tablet (500 mg total) by mouth daily. (Patient taking differently: Take 1,000 mg by mouth daily.) 30 tablet 0   aspirin EC 81 MG tablet Take 1 tablet (81 mg total) by mouth daily. Swallow whole. (Patient taking differently: Take 162 mg by mouth in the morning. Swallow whole.) 30 tablet 12   atorvastatin (LIPITOR) 40 MG tablet Take 1 tablet (40 mg total) by mouth daily. 90 tablet 3   cetirizine (ZYRTEC) 10 MG tablet Take 10 mg by mouth at bedtime.     clonazePAM (KLONOPIN) 0.5 MG tablet Take 1 tablet (0.5 mg total) by mouth 2 (two) times daily as needed for anxiety. 180 tablet 0   CRANBERRY PO Take 4,200 mg by mouth 2 (two) times daily. W/Vit C     famotidine (PEPCID) 20 MG tablet Take 1 tablet (20 mg total) by mouth daily. (Patient taking differently: Take 20 mg by mouth  at bedtime.) 30 tablet 0   FLUoxetine (PROZAC) 20 MG capsule Take 1 capsule (20 mg total) by mouth at bedtime. 90 capsule 3   fluticasone (FLONASE) 50 MCG/ACT nasal spray Place 1 spray into both nostrils daily. 48 g 3   guaiFENesin (MUCINEX) 600 MG 12 hr tablet Take by mouth 2 (two) times daily.     levothyroxine (SYNTHROID) 88 MCG tablet Take 1 tablet (88 mcg total) by mouth daily. 90 tablet 3   lisinopril (ZESTRIL) 10 MG tablet Take 1 tablet (10 mg total) by mouth  daily. 90 tablet 3   metoprolol succinate (TOPROL-XL) 50 MG 24 hr tablet Take 1 tablet (50 mg total) by mouth at bedtime. Take with or immediately following a meal. 90 tablet 3   Multiple Vitamin (MULTIVITAMIN) tablet Take 1 tablet by mouth daily.     potassium chloride (KLOR-CON M) 10 MEQ tablet Take 1 tablet (10 mEq total) by mouth at bedtime. 90 tablet 3   sennosides-docusate sodium (SENOKOT-S) 8.6-50 MG tablet Take 2 tablets by mouth daily. (Patient taking differently: Take 2 tablets by mouth daily with lunch.) 60 tablet 11   tamsulosin (FLOMAX) 0.4 MG CAPS capsule Take 1 capsule (0.4 mg total) by mouth daily after supper. (Patient taking differently: Take 0.4 mg by mouth in the morning.) 90 capsule 3   XTANDI 40 MG tablet TAKE 4 TABLETS BY MOUTH DAILY 120 tablet 2   No current facility-administered medications for this visit.    Review of Systems  Constitutional:  Positive for fatigue. Negative for appetite change and chills.  HENT:   Negative for hearing loss.   Eyes:  Negative for icterus.  Respiratory:  Negative for cough and shortness of breath.   Cardiovascular:  Negative for chest pain.  Gastrointestinal:  Negative for abdominal pain and blood in stool.  Genitourinary:         + Foley catheter  Musculoskeletal:  Negative for back pain.  Skin:  Negative for rash.  Neurological:  Positive for extremity weakness.     PHYSICAL EXAMINATION: ECOG PERFORMANCE STATUS: 2 - Symptomatic, <50% confined to bed  Vitals:    10/03/22 1017  BP: 130/84  Pulse: (!) 55  Resp: 18  Temp: (!) 96.7 F (35.9 C)  SpO2: 99%   There were no vitals filed for this visit.     Physical Exam Constitutional:      General: He is not in acute distress.    Appearance: He is not diaphoretic.     Comments: Patient sits in wheelchair.  HENT:     Head: Normocephalic and atraumatic.     Nose: Nose normal.     Mouth/Throat:     Pharynx: No oropharyngeal exudate.  Eyes:     General: No scleral icterus. Cardiovascular:     Rate and Rhythm: Normal rate.  Pulmonary:     Effort: Pulmonary effort is normal. No respiratory distress.  Abdominal:     General: There is no distension.  Musculoskeletal:        General: Normal range of motion.     Cervical back: Normal range of motion.  Skin:    General: Skin is warm and dry.     Findings: No erythema.  Neurological:     Mental Status: He is alert and oriented to person, place, and time.     Cranial Nerves: No cranial nerve deficit.     Motor: No abnormal muscle tone.     Comments: Quadriplegia  Psychiatric:        Mood and Affect: Mood and affect normal.      LABORATORY DATA:  I have reviewed the data as listed    Latest Ref Rng & Units 10/03/2022   10:03 AM 07/04/2022    9:53 AM 04/25/2022    9:42 AM  CBC  WBC 4.0 - 10.5 K/uL 7.7  6.5  4.0   Hemoglobin 13.0 - 17.0 g/dL 30.1  60.1  09.3   Hematocrit 39.0 - 52.0 % 40.4  41.4  43.4   Platelets 150 - 400 K/uL 240  277  276       Latest Ref Rng & Units 10/03/2022   10:02 AM 09/19/2022    2:58 PM 07/04/2022    9:53 AM  CMP  Glucose 70 - 99 mg/dL 87  88  89   BUN 8 - 23 mg/dL 9  7  8    Creatinine 0.61 - 1.24 mg/dL 2.37  6.28  3.15   Sodium 135 - 145 mmol/L 134  138  135   Potassium 3.5 - 5.1 mmol/L 3.4  4.5  3.5   Chloride 98 - 111 mmol/L 99  98  97   CO2 22 - 32 mmol/L 28  26  27    Calcium 8.9 - 10.3 mg/dL 8.9  9.0  8.8   Total Protein 6.5 - 8.1 g/dL 6.3   6.5   Total Bilirubin 0.3 - 1.2 mg/dL 0.8   0.9   Alkaline  Phos 38 - 126 U/L 73   69   AST 15 - 41 U/L 20   23   ALT 0 - 44 U/L 19   14      RADIOGRAPHIC STUDIES: I have personally reviewed the radiological images as listed and agreed with the findings in the report. LONG TERM MONITOR (3-14 DAYS)  Result Date: 09/06/2022   The patient was monitored for 14 days; only 9 days, 20 hours were suitable for analysis.   The predominant rhythm was sinus with an average rate of 50 bpm in sinus (range 39-81 bpm).   Rare PAC's and frequent PVC's were observed (PVC burden 15.8%).   6 episodes of nonsustained ventricular tachycardia occurred, lasting up to 7 beats with a maximum rate of 139 bpm.   11 supraventricular runs were noted, lasting up to 14.0 seconds with a maximum rate of 126 bpm.   No sustained arrhythmia or prolonged pause was seen.   There were no patient triggered events. Predominantly sinus rhythm with frequent PVC's (15.8% burden) and several episodes of NSVT and PSVT, as detailed above.

## 2022-10-03 NOTE — Assessment & Plan Note (Signed)
Provoked, off Eliquis due to interaction with Xtandi Continue Aspirin 162 mg daily for prophylaxis. 

## 2022-10-03 NOTE — Assessment & Plan Note (Signed)
Metastatic castration sensitive prostate cancer, with CNS involvement. Currently on chemoprevention therapy with Eligard.  Status post palliative radiation to spine. PMSA PET scan showed partial response.  Labs are reviewed and discussed with patient.  He tolerates Xtandi 160 mg daily, continue current regimen. Fatigue/brain fog could be due to CNS side effects. Manageable.

## 2022-10-03 NOTE — Assessment & Plan Note (Signed)
On Eligard 45mg  Q6 months. Next due Dec 2024

## 2022-10-07 ENCOUNTER — Ambulatory Visit: Payer: Managed Care, Other (non HMO) | Attending: Cardiology | Admitting: Cardiology

## 2022-10-07 ENCOUNTER — Encounter: Payer: Self-pay | Admitting: Oncology

## 2022-10-07 ENCOUNTER — Encounter: Payer: Self-pay | Admitting: Cardiology

## 2022-10-07 VITALS — BP 118/68 | HR 56 | Ht 69.0 in | Wt 179.0 lb

## 2022-10-07 DIAGNOSIS — I493 Ventricular premature depolarization: Secondary | ICD-10-CM | POA: Diagnosis not present

## 2022-10-07 DIAGNOSIS — I10 Essential (primary) hypertension: Secondary | ICD-10-CM

## 2022-10-07 NOTE — Progress Notes (Signed)
Electrophysiology Office Note:    Date:  10/07/2022   ID:  Jason [name], DOB 1955/09/06, MRN [id number]  [facility] Cardiologist:  Yvonne Kendall, MD  [facility] Electrophysiologist:  Lanier Prude, MD   Referring MD: Marianne Sofia, PA-C   Chief Complaint: PVCs  History of Present Illness:    Jason [name] is a 67 y.o. malewho I am seeing today for an evaluation of PVCs at the request of Cadence Firth.  The patient was last seen by Cadence on September 24, 2022.  The patient has a medical history that includes metastatic prostate cancer, stroke, hypertension, thyroid disease, GERD, osteoarthritis and PVCs.  He was started on PVCs for the first time in 2022 when he was initially evaluated for palpitations.  A heart monitor performed in June of this year showed a 15.8% burden of PVCs.  At the appointment with cadence he was doing well.  No signs or symptoms of heart failure.  He takes metoprolol succinate 50 mg by mouth daily.  He is with his wife today in clinic.  He is asymptomatic with his PVCs.        Their past medical, social and family history was reveiwed.   ROS:   Please see the history of present illness.    All other systems reviewed and are negative.  EKGs/Labs/Other Studies Reviewed:    The following studies were reviewed today:  September 06, 2022 ZIO monitor personally reviewed 15.8% burden of PVCs Average heart rate 50 bpm 1 dominant morphology.  EKG Interpretation Date/Time:  Tuesday October 07 2022 13:23:34 EDT Ventricular Rate:  56 PR Interval:  156 QRS Duration:  106 QT Interval:  488 QTC Calculation: 470 R Axis:   58  Text Interpretation: Sinus bradycardia with frequent Premature ventricular complexes in a pattern of bigeminy Confirmed by Steffanie Dunn [phone]) on 10/07/2022 1:29:04 PM    Physical Exam:    VS:  BP 118/68   Pulse (!) 56   Ht 5\' 9"  (1.753 m)   Wt 179 lb (81.2 kg)   SpO2 100%   BMI 26.43 kg/m     Wt  Readings from Last 3 Encounters:  10/07/22 179 lb (81.2 kg)  08/08/22 179 lb (81.2 kg)  06/27/22 179 lb (81.2 kg)     GEN:  Well nourished, well developed in no acute distress CARDIAC: Irregular rhythm, no murmurs, rubs, gallops RESPIRATORY:  Clear to auscultation without rales, wheezing or rhonchi       ASSESSMENT AND PLAN:    1. PVC's (premature ventricular contractions)   2. Primary hypertension     #PVCs Frequent.  15.8% on recent ZIO monitor.  He is asymptomatic.  Echocardiogram is pending but no signs or symptoms of heart failure during today's clinic appointment.  If the echocardiogram scheduled for August 29 shows normal ejection fraction, can proceed with a more conservative strategy.  I have recommended he continue taking metoprolol succinate.  He should have close follow-up with his primary care physician.  I did discuss indications for PVC suppression during today's clinic appointment.  If he were to develop LV dysfunction or symptoms related to the PVCs, would recommend EP follow-up.  #Hypertension At goal today.  Recommend checking blood pressures 1-2 times per week at home and recording the values.  Recommend bringing these recordings to the primary care physician.  Follow-up with EP on an as-needed basis or based on echo results.        Signed, Rossie Muskrat. Lalla Brothers, MD,  [facility], [facility] 10/07/2022 1:40 PM    Electrophysiology [facility]

## 2022-10-07 NOTE — Patient Instructions (Signed)
Medication Instructions:  The current medical regimen is effective;  continue present plan and medications.  *If you need a refill on your cardiac medications before your next appointment, please call your pharmacy*   Follow-Up: At [facility], you and your health needs are our priority.  As part of our continuing mission to provide you with exceptional heart care, we have created designated Provider Care Teams.  These Care Teams include your primary Cardiologist (physician) and Advanced Practice Providers (APPs -  Physician Assistants and Nurse Practitioners) who all work together to provide you with the care you need, when you need it.  We recommend signing up for the patient portal called "MyChart".  Sign up information is provided on this After Visit Summary.  MyChart is used to connect with patients for Virtual Visits (Telemedicine).  Patients are able to view lab/test results, encounter notes, upcoming appointments, etc.  Non-urgent messages can be sent to your provider as well.   To learn more about what you can do with MyChart, go to [email or web address].    Your next appointment:   As needed  Provider:   Steffanie Dunn, MD

## 2022-10-10 ENCOUNTER — Telehealth: Payer: Self-pay | Admitting: Internal Medicine

## 2022-10-10 NOTE — Telephone Encounter (Signed)
The fax was released late yesterday in the [facility]. Dr Alphonsus Sias just approved it and I will fax it to them this afternoon.

## 2022-10-10 NOTE — Telephone Encounter (Signed)
Home Health verbal orders Caller Name:chris Agency Name: [facility]   Callback number: [phone]  Requesting OT/PT/Skilled nursing/Social Work/Speech: Continuation of physical therapy orders   Reason: Transfer training   Frequency: 1wk 4  Please forward to [facility] pool or providers CMA

## 2022-10-10 NOTE — Telephone Encounter (Signed)
Verbal orders given to [name] 

## 2022-10-10 NOTE — Telephone Encounter (Signed)
[facility] contacted the office for clarification on medication levothyroxine (SYNTHROID) 88 MCG tablet, says it is changing manufacturers and they need pcp approval to change med. Fax was sent yesterday regarding this, advised I would send message to pcp for clarification

## 2022-10-13 ENCOUNTER — Other Ambulatory Visit: Payer: Self-pay | Admitting: Medical

## 2022-10-13 ENCOUNTER — Other Ambulatory Visit: Payer: Self-pay | Admitting: Internal Medicine

## 2022-10-13 DIAGNOSIS — I10 Essential (primary) hypertension: Secondary | ICD-10-CM

## 2022-10-13 DIAGNOSIS — I493 Ventricular premature depolarization: Secondary | ICD-10-CM

## 2022-10-13 DIAGNOSIS — Z86718 Personal history of other venous thrombosis and embolism: Secondary | ICD-10-CM

## 2022-10-13 DIAGNOSIS — I4729 Other ventricular tachycardia: Secondary | ICD-10-CM

## 2022-10-13 DIAGNOSIS — E782 Mixed hyperlipidemia: Secondary | ICD-10-CM

## 2022-10-14 MED ORDER — CLONAZEPAM 0.5 MG PO TABS: 0.5000 mg | ORAL_TABLET | Freq: Two times a day (BID) | ORAL | 0 refills | Status: DC | PRN

## 2022-10-20 ENCOUNTER — Encounter: Payer: Self-pay | Admitting: Oncology

## 2022-10-22 ENCOUNTER — Ambulatory Visit: Payer: Self-pay | Admitting: Urology

## 2022-10-23 ENCOUNTER — Encounter: Payer: Self-pay | Admitting: Urology

## 2022-10-23 ENCOUNTER — Encounter: Payer: Self-pay | Admitting: Oncology

## 2022-10-23 ENCOUNTER — Ambulatory Visit: Payer: Managed Care, Other (non HMO) | Attending: Medical

## 2022-10-23 ENCOUNTER — Ambulatory Visit (INDEPENDENT_AMBULATORY_CARE_PROVIDER_SITE_OTHER): Payer: Managed Care, Other (non HMO) | Admitting: Urology

## 2022-10-23 ENCOUNTER — Other Ambulatory Visit: Payer: Self-pay | Admitting: Medical

## 2022-10-23 VITALS — BP 127/77 | HR 55

## 2022-10-23 DIAGNOSIS — I493 Ventricular premature depolarization: Secondary | ICD-10-CM

## 2022-10-23 DIAGNOSIS — I083 Combined rheumatic disorders of mitral, aortic and tricuspid valves: Secondary | ICD-10-CM | POA: Diagnosis not present

## 2022-10-23 DIAGNOSIS — I5030 Unspecified diastolic (congestive) heart failure: Secondary | ICD-10-CM

## 2022-10-23 DIAGNOSIS — N319 Neuromuscular dysfunction of bladder, unspecified: Secondary | ICD-10-CM | POA: Diagnosis not present

## 2022-10-23 DIAGNOSIS — Z87440 Personal history of urinary (tract) infections: Secondary | ICD-10-CM

## 2022-10-23 DIAGNOSIS — I4729 Other ventricular tachycardia: Secondary | ICD-10-CM | POA: Diagnosis not present

## 2022-10-23 DIAGNOSIS — I10 Essential (primary) hypertension: Secondary | ICD-10-CM

## 2022-10-23 DIAGNOSIS — Z86718 Personal history of other venous thrombosis and embolism: Secondary | ICD-10-CM

## 2022-10-23 DIAGNOSIS — C61 Malignant neoplasm of prostate: Secondary | ICD-10-CM | POA: Diagnosis not present

## 2022-10-23 DIAGNOSIS — E782 Mixed hyperlipidemia: Secondary | ICD-10-CM

## 2022-10-23 DIAGNOSIS — R339 Retention of urine, unspecified: Secondary | ICD-10-CM

## 2022-10-23 LAB — ECHOCARDIOGRAM LIMITED: Area-P 1/2: 2.6 cm2

## 2022-10-23 NOTE — Progress Notes (Signed)
   10/23/2022 10:02 AM   Jason [name] 11-Oct-1955 [id number]  Reason for visit: Follow up metastatic prostate cancer, urinary retention  HPI: Complex 67 year old male with prolonged hospitalization and rehab stay August through October of 2023.  Briefly, presented with extremity weakness and was found to have PSA >1500, underwent C6 cervical laminectomy and resection of tumor on 10/06/2021 showing metastatic prostate cancer, 10/07/21 developed significant bilateral upper and lower extremity weakness with hypotension requiring transfer to the ICU, pressors, steroids, and respiratory failure requiring adequate ventilation.  Firmagon loading dose was given on 10/09/21, and he has continued on ADT.  He also received palliative radiation to the spine.  He is followed by oncology, and also is on enzalutamide(not a candidate for docetaxel).  He has been in urinary retention since that time with Foley catheter felt to be secondary to neurogenic bladder.  He was referred to urology for bladder management options.  His prostate cancer is managed by oncology(Dr. Cathie Hoops), and most recent PSA 2.1 which continues to downtrend from 58 in November 2023, and >1500 at time of diagnosis.  Most of the history is obtained from his wife again today, and I reviewed the oncology notes extensively.  He has had an indwelling foley since that time changed on a monthly basis by home health.  He has had 3 UTIs, but overall UTIs have improved significantly over the last 6 months since starting cranberry tablets.  Overall, no major changes since our last visit.  He continues to work with PT and has gained some upper body strength, but remains wheelchair dependent.  His wife again provides most of the history.  They are both very concerned about catheter removal and risk of incontinence or urgency with inability to get to a urinal in time, as well as his dependency for additional help.  We reviewed options again at length including  a voiding trial, urodynamics, or chronic Foley, or consideration of a suprapubic tube, and the risks and benefits were discussed extensively.  After long conversation, they are interested in a suprapubic tube.  They understand this could be capped in the future for potentially a trial of void.  Hard to predict if he will have leakage from below with a suprapubic tube.  Challenges of managing neurogenic bladder and unpredictability discussed extensively.  Continue oncology follow-up for treatment of prostate cancer Order placed for interventional radiology placement of suprapubic tube, Foley catheter can be removed at that time RTC with me 4 months for management of neurogenic bladder, consider capping trial  I spent 40 total minutes on the day of the encounter including pre-visit review of the medical record, face-to-face time with the patient, and post visit ordering of labs/imaging/tests.    Sondra Come, MD  [facility], [facility] [address], [address] [place], [place] [address] [phone]

## 2022-10-28 ENCOUNTER — Telehealth: Payer: Self-pay

## 2022-10-28 NOTE — Telephone Encounter (Signed)
Pt made aware of Dr. Serita Kyle recommendations Pt verbalized understanding Appointment rescheduled for next Thursday 11/06/22 with Dr. Okey Dupre.

## 2022-10-28 NOTE — Telephone Encounter (Signed)
Please have [name] see me or an APP within the next week.  He will also need to follow-up with Dr. 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  I will forward this to Dr. Lalla Brothers for his thoughts as well.  Yvonne Kendall, MD [facility]

## 2022-10-28 NOTE — Telephone Encounter (Signed)
Pt made aware of ECHO results along with Cadence's recommendations.  Per Dr. Lovena Neighbours last office visit, he recommended pt follow up as needed. Will forward message to primary cardiologist to see if he would like to see pt sooner than Nov.  Pt denies SOB or swelling. However, he reported he feels very fatigue and has been associating symptoms to his chemo medication.    Fransico Michael, Cadence H, PA-C  Parke Poisson, RN Echo showed pump function 25-30%. This lower than prior, which was normal PLAN: patient needs to follow-up with EP based off Lamberdt's instructions.

## 2022-10-29 ENCOUNTER — Other Ambulatory Visit: Payer: Self-pay

## 2022-10-29 DIAGNOSIS — N319 Neuromuscular dysfunction of bladder, unspecified: Secondary | ICD-10-CM

## 2022-10-29 DIAGNOSIS — R339 Retention of urine, unspecified: Secondary | ICD-10-CM

## 2022-11-03 ENCOUNTER — Telehealth: Payer: Self-pay

## 2022-11-03 DIAGNOSIS — I420 Dilated cardiomyopathy: Secondary | ICD-10-CM

## 2022-11-03 DIAGNOSIS — I493 Ventricular premature depolarization: Secondary | ICD-10-CM

## 2022-11-03 NOTE — Telephone Encounter (Signed)
Spoke with the patient and his wife and scheduled a follow up appointment with [place]  Cardiac MRI has been ordered.

## 2022-11-03 NOTE — Telephone Encounter (Signed)
-----   Message from Lanier Prude sent at 11/01/2022  9:28 PM EDT ----- EF now reduced. Needs follow up with Suzann or me in clinic to discuss PVC suppression. He should also get a cardiac MRI to assess for any evidence of inflammation, etc.Carly, can you help set up the appointment with suzann/me please? Thanks, Steffanie Dunn ----- Message ----- From: Frutoso Schatz, RN Sent: 10/24/2022   7:45 AM EDT To: Lanier Prude, MD

## 2022-11-05 ENCOUNTER — Encounter: Payer: Self-pay | Admitting: Oncology

## 2022-11-05 NOTE — Progress Notes (Unsigned)
Cardiology Office Note Date:  11/06/2022  Patient ID:  Jason [name], DOB 1955/07/16, MRN [id number] PCP:  Karie Schwalbe, MD  Cardiologist:  Yvonne Kendall, MD Electrophysiologist: Lanier Prude, MD    Chief Complaint: PVCs, reduced LVEF  History of Present Illness: Jason [name] is a 67 y.o. male with PMH notable for PVCs, HFrEF, HTN, metastatic prostate Ca, CVA, thyroid disease; seen today for Lanier Prude, MD for routine electrophysiology followup.  He saw Dr. Lalla Brothers 09/2022 for initial PVC evaluation, recent zio showed 15% PVC burden, he was asymptomatic. Was taking 50mg  toprol daily. Recommended echo to eval LVEF, which revealed significantly reduced LVEF to 25-30%. He is scheduled for cardiac MRI for further eval.  On follow-up today, he overall feels at his baseline. Continues to work with PT twice per day, is noticing small improvements in strength. Remains wheelchair-bound. He denies chest pain, palpitations, chest discomfort. No increased edema.  He saw Dr. Okey Dupre earlier today, who stopped lisinopril and started entresto for newly diagnosed HFrEF.  He is joined by his wife today in clinic.   AAD History: none  Past Medical History:  Diagnosis Date   Allergy    Arthritis    osteoarthritis   GERD (gastroesophageal reflux disease)    History of kidney stones    Hypertension    Hypothyroidism    Palpitations 2022   Pneumonia    Stroke [facility])    Mini stroke - Aug 2023   Thyroid disease    Hyperthyroidism s/p radioactive iodine ablation    Past Surgical History:  Procedure Laterality Date   CARDIAC CATHETERIZATION  03/2000   HERNIA REPAIR  1961   umbilical   LEFT HEART CATH AND CORONARY ANGIOGRAPHY Left 03/20/2020   Procedure: LEFT HEART CATH AND CORONARY ANGIOGRAPHY;  Surgeon: Yvonne Kendall, MD;  Location: [facility] INVASIVE CV LAB;  Service: Cardiovascular;  Laterality: Left;   POSTERIOR CERVICAL FUSION/FORAMINOTOMY N/A 10/06/2021   Procedure:  POSTERIOR CERVICAL FUSION/ FORAMINOTOMY LEVEL 3;  Surgeon: Venetia Night, MD;  Location: [facility] ORS;  Service: Neurosurgery;  Laterality: N/A;  will need monitoring   RADIOLOGY WITH ANESTHESIA Left 04/18/2021   Procedure: MRI SHOULDER WITHOUT CONTRAST WITH ANESTHESIA;  Surgeon: Radiologist, Medication, MD;  Location: [facility] OR;  Service: Radiology;  Laterality: Left;   RADIOLOGY WITH ANESTHESIA N/A 10/03/2021   Procedure: MRI CERVICAL SPINE WITH ANESTHESIA;  Surgeon: Radiologist, Medication, MD;  Location: [facility] OR;  Service: Radiology;  Laterality: N/A;   RADIOLOGY WITH ANESTHESIA N/A 01/30/2022   Procedure: MRI CERVICAL SPINE WITH AND WITOHOUT CONTRAST WITH ANESTHESIA;  Surgeon: Radiologist, Medication, MD;  Location: [facility] OR;  Service: Radiology;  Laterality: N/A;   RADIOLOGY WITH ANESTHESIA N/A 05/22/2022   Procedure: MRI LUMBER SPINE AND CERVIC SPINE WITH AND WITHOUT CONTRAST;  Surgeon: Radiologist, Medication, MD;  Location: [facility] OR;  Service: Radiology;  Laterality: N/A;   REVERSE SHOULDER ARTHROPLASTY Left    ROTATOR CUFF REPAIR  11/2010   Dr Hyacinth Meeker   TONSILLECTOMY     TOTAL HIP ARTHROPLASTY Right 02/24/2009   TOTAL HIP ARTHROPLASTY Left 06/2016   Dr Odis Luster    Current Outpatient Medications  Medication Instructions   acetaminophen (TYLENOL) 325-650 mg, Oral, Every 4 hours PRN   ascorbic acid (VITAMIN C) 500 mg, Oral, Daily   aspirin EC 81 mg, Oral, Daily, Swallow whole.   atorvastatin (LIPITOR) 40 mg, Oral, Daily   cetirizine (ZYRTEC) 10 mg, Oral, Daily at bedtime   clonazePAM (KLONOPIN) 0.5 mg, Oral, 2 times  daily PRN   CRANBERRY PO 4,200 mg, Oral, 2 times daily, W/Vit C   famotidine (PEPCID) 20 mg, Oral, Daily   FLUoxetine (PROZAC) 20 mg, Oral, Daily at bedtime   fluticasone (FLONASE) 50 MCG/ACT nasal spray 1 spray, Each Nare, Daily   guaiFENesin (MUCINEX) 600 MG 12 hr tablet Oral, 2 times daily   levothyroxine (SYNTHROID) 88 mcg, Oral, Daily   metoprolol succinate (TOPROL-XL) 50 mg,  Oral, Daily at bedtime, Take with or immediately following a meal.   Multiple Vitamin (MULTIVITAMIN) tablet 1 tablet, Oral, Daily,     potassium chloride (KLOR-CON M) 10 MEQ tablet 10 mEq, Oral, Daily at bedtime   sacubitril-valsartan (ENTRESTO) 24-26 MG 1 tablet, Oral, 2 times daily   sennosides-docusate sodium (SENOKOT-S) 8.6-50 MG tablet 2 tablets, Oral, Daily   tamsulosin (FLOMAX) 0.4 mg, Oral, Daily after supper   Xtandi 160 mg, Oral, Daily    Social History:  The patient  reports that he has never smoked. He has never been exposed to tobacco smoke. He has never used smokeless tobacco. He reports that he does not currently use alcohol. He reports that he does not use drugs.   Family History:  The patient's family history includes Breast cancer in his paternal aunt; Drug abuse in his sister; Gout in his father; Heart attack (age of onset: 60) in his father; Heart disease in his sister; Hypertension in his mother; Lung cancer in his maternal grandmother and sister; Stroke in his mother.  ROS:  Please see the history of present illness. All other systems are reviewed and otherwise negative.   PHYSICAL EXAM:  VS:  BP 110/80 (BP Location: Left Arm, Patient Position: Sitting, Cuff Size: Normal)   Pulse 67   Ht 5\' 9"  (1.753 m)   Wt 179 lb (81.2 kg)   BMI 26.43 kg/m  BMI: Body mass index is 26.43 kg/m.  GEN- The patient is well appearing, alert and oriented x 3 today.   Lungs- Clear to ausculation bilaterally, normal work of breathing.  Heart- Irregularly irregular rate and rhythm, no murmurs, rubs or gallops Extremities- Trace peripheral edema, warm, dry   EKG is not ordered. Personal review of EKG from today shows:  SR with bigeminal polymorphic PVCs, rate 65       10/07/2022 EKG: SB w bigeminal PVCs, rate 56bpm   Recent Labs: 08/08/2022: Magnesium 2.2 09/19/2022: TSH 5.220 10/03/2022: ALT 19; BUN 9; Creatinine 0.42; Hemoglobin 13.5; Platelet Count 240; Potassium 3.4; Sodium 134   09/19/2022: Chol/HDL Ratio 2.4; Cholesterol, Total 123; HDL 52; LDL Chol Calc (NIH) 56; Triglycerides 74   CrCl cannot be calculated (Patient's most recent lab result is older than the maximum 21 days allowed.).   Wt Readings from Last 3 Encounters:  11/06/22 179 lb (81.2 kg)  10/07/22 179 lb (81.2 kg)  08/08/22 179 lb (81.2 kg)     Additional studies reviewed include: Previous EP, cardiology notes.   TTE, 10/23/2022  1. Left ventricular ejection fraction, by estimation, is 25 to 30%. The left ventricle has severely decreased function. The left ventricle demonstrates global hypokinesis. The left ventricular internal cavity size was moderately dilated. Left ventricular diastolic parameters are consistent with Grade II diastolic dysfunction (pseudonormalization). The average left ventricular global longitudinal strain is -10.8 %. The global longitudinal strain is  abnormal.   2. Right ventricular systolic function is normal. The right ventricular size is normal.   3. The mitral valve is normal in structure. Mild to moderate mitral valve regurgitation. No evidence  of mitral stenosis.   4. The aortic valve is normal in structure. Aortic valve regurgitation is mild. No aortic stenosis is present.   5. The inferior vena cava is normal in size with greater than 50% respiratory variability, suggesting right atrial pressure of 3 mmHg.   Long term monitor, 09/06/2022   The patient was monitored for 14 days; only 9 days, 20 hours were suitable for analysis.   The predominant rhythm was sinus with an average rate of 50 bpm in sinus (range 39-81 bpm).   Rare PAC's and frequent PVC's were observed (PVC burden 15.8%).   6 episodes of nonsustained ventricular tachycardia occurred, lasting up to 7 beats with a maximum rate of 139 bpm.   11 supraventricular runs were noted, lasting up to 14.0 seconds with a maximum rate of 126 bpm.   No sustained arrhythmia or prolonged pause was seen.   There were no  patient triggered events.  TTE, 10/08/2021  1. Left ventricular ejection fraction, by estimation, is 55 to 60%. The left ventricle has normal function. The left ventricle has no regional wall motion abnormalities. Left ventricular diastolic parameters were normal.   2. Right ventricular systolic function is normal. The right ventricular size is normal.   3. The mitral valve is normal in structure. Mild mitral valve regurgitation. No evidence of mitral stenosis.   4. The aortic valve is normal in structure. Aortic valve regurgitation is not visualized. Mild aortic valve stenosis.   5. The inferior vena cava is normal in size with greater than 50% respiratory variability, suggesting right atrial pressure of 3 mmHg.     ASSESSMENT AND PLAN:  #) PVC #) HFrEF #) bradycardia Recent ambulatory monitor with 15% PVC burden with newly diagnosed reduced LVEF Up-titration of BB limited by bradycardia Dr. Okey Dupre initiated entresto Will discuss AAD options with oncologist, Dr. Lalla Brothers, and Dr. Okey Dupre Medications limited by prostate cancer treatments: xtandi and eligard.  Further, query whether cardiomyopathy is result of oncology medication       Current medicines are reviewed at length with the patient today.   The patient has concerns regarding his medicines.  The following changes were made today:  none  Labs/ tests ordered today include:  No orders of the defined types were placed in this encounter.    Disposition: Follow up with Dr. Lalla Brothers or EP APP in 4 weeks   Signed, Sherie Don, NP  11/06/22  3:38 PM  Electrophysiology [facility]

## 2022-11-06 ENCOUNTER — Ambulatory Visit (INDEPENDENT_AMBULATORY_CARE_PROVIDER_SITE_OTHER): Payer: Managed Care, Other (non HMO) | Admitting: Cardiology

## 2022-11-06 ENCOUNTER — Encounter: Payer: Self-pay | Admitting: Internal Medicine

## 2022-11-06 ENCOUNTER — Encounter: Payer: Self-pay | Admitting: Cardiology

## 2022-11-06 ENCOUNTER — Ambulatory Visit: Payer: Managed Care, Other (non HMO) | Attending: Internal Medicine | Admitting: Internal Medicine

## 2022-11-06 VITALS — BP 110/80 | HR 67 | Ht 69.0 in | Wt 179.0 lb

## 2022-11-06 VITALS — BP 126/68 | HR 65

## 2022-11-06 DIAGNOSIS — I493 Ventricular premature depolarization: Secondary | ICD-10-CM

## 2022-11-06 DIAGNOSIS — Z79899 Other long term (current) drug therapy: Secondary | ICD-10-CM | POA: Diagnosis not present

## 2022-11-06 DIAGNOSIS — R001 Bradycardia, unspecified: Secondary | ICD-10-CM | POA: Diagnosis not present

## 2022-11-06 DIAGNOSIS — I5020 Unspecified systolic (congestive) heart failure: Secondary | ICD-10-CM

## 2022-11-06 MED ORDER — ENTRESTO 24-26 MG PO TABS: 1.0000 | ORAL_TABLET | Freq: Two times a day (BID) | ORAL | 3 refills | Status: DC

## 2022-11-06 MED ORDER — ENTRESTO 24-26 MG PO TABS: 1.0000 | ORAL_TABLET | Freq: Two times a day (BID) | ORAL | 0 refills | Status: DC

## 2022-11-06 MED ORDER — ENTRESTO 24-26 MG PO TABS: 1.0000 | ORAL_TABLET | Freq: Two times a day (BID) | ORAL | Status: DC

## 2022-11-06 NOTE — Progress Notes (Signed)
Cardiology Office Note:  .   Date:  11/06/2022  ID:  Jason [name], DOB September 01, 1955, MRN [id number] PCP: Karie Schwalbe, MD  [facility] Providers Cardiologist:  Yvonne Kendall, MD Electrophysiologist:  Lanier Prude, MD     History of Present Illness: .   Jason [name] is a 67 y.o. male with history of frequent PVCs, metastatic prostate cancer, stroke, hypertension, thyroid disease, and GERD, who presents for follow-up of newly diagnosed cardiomyopathy.  He was last seen in our office in June by Terrilee Croak, [name], at which time he was concerned about intermittent low heart rates, dipping into the 30s or 40s when working with physical therapy.  He noted occasional leg edema but was otherwise asymptomatic.  EKG showed normal sinus rhythm with frequent PVCs.  Subsequent event monitor showed predominantly sinus rhythm with frequent PVCs (15.8% burden) with several episodes of NSVT and PSVT.  He was referred to Dr. Lalla Brothers, at which time no medication changes were recommended.  Subsequent echo revealed newly reduced LVEF of 25-30% with global hypokinesis and mild-moderate mitral regurgitation.  He has been referred for cardiac MRI by Dr. Lalla Brothers.  Today, Jason [name] reports that he has been feeling fairly well, though he notes some fatigue since he began his "chemo pill" last year.  He has good days and bad days.  He denies chest pain, shortness of breath, palpitations, lightheadedness, and edema.  He continues to work with physical therapy and is intermittently able to stand for brief periods of time with assistance.  ROS: See HPI  Studies Reviewed: Marland Kitchen   EKG Interpretation Date/Time:  Thursday November 06 2022 12:07:15 EDT Ventricular Rate:  65 PR Interval:  144 QRS Duration:  88 QT Interval:  480 QTC Calculation: 499 R Axis:   25  Text Interpretation: Sinus bradycardia with frequent Premature ventricular complexes Left ventricular hypertrophy with repolarization  abnormality ( Sokolow-Lyon ) Prolonged QT When compared with ECG of 07-Oct-2022 13:23, No significant change was found Confirmed by Tylan Briguglio, Cristal Deer [phone]) on 11/06/2022 12:10:35 PM    Limited TTE (10/23/2022): Moderately dilated left ventricle with LVEF 25-30% and global hypokinesis.  Grade 2 diastolic dysfunction.  Global longitudinal strain -10.8%.  Normal RV size and function.  Mild to moderate mitral regurgitation.  Mild tricuspid regurgitation.  Normal CVP.  Monitor (09/05/2022): Predominantly sinus rhythm with frequent PVCs (15.8% burden) and several episodes of NSVT and PSVT.  Risk Assessment/Calculations:             Physical Exam:   VS:  BP 126/68 (BP Location: Left Arm, Patient Position: Sitting, Cuff Size: Normal)   Pulse 65    Wt Readings from Last 3 Encounters:  10/07/22 179 lb (81.2 kg)  08/08/22 179 lb (81.2 kg)  06/27/22 179 lb (81.2 kg)    General:  NAD.  Seated in a wheelchair. Neck: No JVD or HJR. Lungs: Clear to auscultation bilaterally without wheezes or crackles. Heart: Bradycardic but regular without murmurs, rubs, or gallops. Abdomen: Soft, nontender, nondistended. Extremities: No lower extremity edema.  ASSESSMENT AND PLAN: .    HFrEF: Jason [name] is asymptomatic though his functional capacity is very limited.  His LVEF has declined from 55-60% in 09/2021 to 20-25% on last month's echocardiogram.  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  Considerations would include drug-induced and PVC induced cardiomyopathy's.  I will reach out to Dr. Cathie Hoops and our EP team for their thoughts.  Cardiac  MRI has already been ordered and is scheduled for next month.  In the meantime, I will continue Jason [name] current dose of metoprolol succinate and transition from lisinopril to Entresto 24-26 mg twice daily.  We will check a BMP in 2 weeks and have Jason [name] follow-up in 4 weeks to assess his response  and hopefully further escalate his GDMT.  Given his indwelling Foley catheter (plans for transition to suprapubic catheter soon), I would be reluctant to add an SGLT2 inhibitor.  Frequent PVCs: Jason [name] remains asymptomatic, though I worry his cardiomyopathy may at least in part be due to this.  Certainly worsening cardiomyopathy could also be driving some of his PVCs.  Jason [name] should continue to follow closely with electrophysiology.    Dispo: Return to clinic in 1 month.  Signed, Yvonne Kendall, MD

## 2022-11-06 NOTE — Patient Instructions (Signed)
Medication Instructions:  Your physician recommends that you continue on your current medications as directed. Please refer to the Current Medication list given to you today.  *If you need a refill on your cardiac medications before your next appointment, please call your pharmacy*  Lab Work: -None ordered  Testing/Procedures: -None ordered  Follow-Up: At [facility], you and your health needs are our priority.  As part of our continuing mission to provide you with exceptional heart care, we have created designated Provider Care Teams.  These Care Teams include your primary Cardiologist (physician) and Advanced Practice Providers (APPs -  Physician Assistants and Nurse Practitioners) who all work together to provide you with the care you need, when you need it.  Your next appointment:   1 month(s) Friday 12/12/22  Provider:   Sherie Don, NP    Other Instructions -None

## 2022-11-06 NOTE — Patient Instructions (Signed)
Medication Instructions:  Your physician recommends the following medication changes.  STOP TAKING: Lisinopril  START TAKING: Entresto 24-26 mg by mouth twice a day (DO NOT START until Saturday 11/08/22)  *If you need a refill on your cardiac medications before your next appointment, please call your pharmacy*   Lab Work: Your provider would like for you to return in 2 weeks to have the following labs drawn: (BMP).   Please go to [facility] [address] [address], [place] [address] You do not need an appointment.  They are open from 7:30 am-4 pm.  Lunch from 1:00 pm- 2:00 pm You will need to be fasting.   Testing/Procedures: No test ordered today    Follow-Up: At [facility], you and your health needs are our priority.  As part of our continuing mission to provide you with exceptional heart care, we have created designated Provider Care Teams.  These Care Teams include your primary Cardiologist (physician) and Advanced Practice Providers (APPs -  Physician Assistants and Nurse Practitioners) who all work together to provide you with the care you need, when you need it.  We recommend signing up for the patient portal called "MyChart".  Sign up information is provided on this After Visit Summary.  MyChart is used to connect with patients for Virtual Visits (Telemedicine).  Patients are able to view lab/test results, encounter notes, upcoming appointments, etc.  Non-urgent messages can be sent to your provider as well.   To learn more about what you can do with MyChart, go to [email or web address].    Your next appointment:   1 month(s)  Provider:   You may see Yvonne Kendall, MD or one of the following Advanced Practice Providers on your designated Care Team:   Nicolasa Ducking, NP Eula Listen, PA-C Cadence Fransico Michael, PA-C Charlsie Quest, NP

## 2022-11-07 ENCOUNTER — Telehealth: Payer: Self-pay | Admitting: Internal Medicine

## 2022-11-07 ENCOUNTER — Encounter: Payer: Self-pay | Admitting: Internal Medicine

## 2022-11-07 DIAGNOSIS — I5020 Unspecified systolic (congestive) heart failure: Secondary | ICD-10-CM | POA: Insufficient documentation

## 2022-11-07 NOTE — Telephone Encounter (Signed)
Home Health verbal orders Caller Name: Misty Stanley Agency Name: [facility]  Callback number: [phone]  Requesting Skilled nursing eval.  Reason: wound open on bottom  Please forward to [facility] pool or providers CMA

## 2022-11-10 ENCOUNTER — Other Ambulatory Visit: Payer: Self-pay | Admitting: Student

## 2022-11-10 DIAGNOSIS — Z01812 Encounter for preprocedural laboratory examination: Secondary | ICD-10-CM

## 2022-11-10 NOTE — H&P (Signed)
Chief Complaint: Neurogenic bladder with urinary retention. Request is for suprapubic catheter placement.  Referring Physician(s): Sninsky,Brian C  Supervising Physician: {Supervising Physician:[id number]}  Patient Status: [facility] - Out-pt  History of Present Illness: Jason [name] is a 67 y.o. male  outpatient. History of metastatic prostate cancer, neurogenic bladder with urinary retention s/p foley catheter placement for chronic UTI's, Team is requesting a suprapubic catheter placement.   Currently without any significant complaints. Patient alert and laying in bed,calm. Denies any fevers, headache, chest pain, SOB, cough, abdominal pain, nausea, vomiting or bleeding.  Return precautions and treatment recommendations and follow-up discussed with the patient *** who is agreeable with the plan.     Past Medical History:  Diagnosis Date   Allergy    Arthritis    osteoarthritis   GERD (gastroesophageal reflux disease)    History of kidney stones    Hypertension    Hypothyroidism    Palpitations 2022   Pneumonia    Stroke [facility])    Mini stroke - Aug 2023   Thyroid disease    Hyperthyroidism s/p radioactive iodine ablation    Past Surgical History:  Procedure Laterality Date   CARDIAC CATHETERIZATION  03/2000   HERNIA REPAIR  1961   umbilical   LEFT HEART CATH AND CORONARY ANGIOGRAPHY Left 03/20/2020   Procedure: LEFT HEART CATH AND CORONARY ANGIOGRAPHY;  Surgeon: Yvonne Kendall, MD;  Location: [facility] INVASIVE CV LAB;  Service: Cardiovascular;  Laterality: Left;   POSTERIOR CERVICAL FUSION/FORAMINOTOMY N/A 10/06/2021   Procedure: POSTERIOR CERVICAL FUSION/ FORAMINOTOMY LEVEL 3;  Surgeon: Venetia Night, MD;  Location: [facility] ORS;  Service: Neurosurgery;  Laterality: N/A;  will need monitoring   RADIOLOGY WITH ANESTHESIA Left 04/18/2021   Procedure: MRI SHOULDER WITHOUT CONTRAST WITH ANESTHESIA;  Surgeon: Radiologist, Medication, MD;  Location: [facility] OR;  Service: Radiology;   Laterality: Left;   RADIOLOGY WITH ANESTHESIA N/A 10/03/2021   Procedure: MRI CERVICAL SPINE WITH ANESTHESIA;  Surgeon: Radiologist, Medication, MD;  Location: [facility] OR;  Service: Radiology;  Laterality: N/A;   RADIOLOGY WITH ANESTHESIA N/A 01/30/2022   Procedure: MRI CERVICAL SPINE WITH AND WITOHOUT CONTRAST WITH ANESTHESIA;  Surgeon: Radiologist, Medication, MD;  Location: [facility] OR;  Service: Radiology;  Laterality: N/A;   RADIOLOGY WITH ANESTHESIA N/A 05/22/2022   Procedure: MRI LUMBER SPINE AND CERVIC SPINE WITH AND WITHOUT CONTRAST;  Surgeon: Radiologist, Medication, MD;  Location: [facility] OR;  Service: Radiology;  Laterality: N/A;   REVERSE SHOULDER ARTHROPLASTY Left    ROTATOR CUFF REPAIR  11/2010   Dr Hyacinth Meeker   TONSILLECTOMY     TOTAL HIP ARTHROPLASTY Right 02/24/2009   TOTAL HIP ARTHROPLASTY Left 06/2016   Dr Odis Luster    Allergies: Patient has no known allergies.  Medications: Prior to Admission medications   Medication Sig Start Date End Date Taking? Authorizing Provider  acetaminophen (TYLENOL) 325 MG tablet Take 1-2 tablets (325-650 mg total) by mouth every 4 (four) hours as needed for mild pain. 11/19/21   Angiulli, Mcarthur Rossetti, PA-C  ascorbic acid (VITAMIN C) 500 MG tablet Take 1 tablet (500 mg total) by mouth daily. 11/19/21   Angiulli, Mcarthur Rossetti, PA-C  aspirin EC 81 MG tablet Take 1 tablet (81 mg total) by mouth daily. Swallow whole. 10/18/21   Enedina Finner, MD  atorvastatin (LIPITOR) 40 MG tablet Take 1 tablet (40 mg total) by mouth daily. 04/11/22   Karie Schwalbe, MD  cetirizine (ZYRTEC) 10 MG tablet Take 10 mg by mouth at bedtime.    [provider]  clonazePAM (KLONOPIN) 0.5 MG tablet Take 1 tablet (0.5 mg total) by mouth 2 (two) times daily as needed for anxiety. 10/14/22   Karie Schwalbe, MD  CRANBERRY PO Take 4,200 mg by mouth 2 (two) times daily. W/Vit C    [provider]  famotidine (PEPCID) 20 MG tablet Take 1 tablet (20 mg total) by mouth daily. 11/19/21    Angiulli, Mcarthur Rossetti, PA-C  FLUoxetine (PROZAC) 20 MG capsule Take 1 capsule (20 mg total) by mouth at bedtime. 04/11/22   Karie Schwalbe, MD  fluticasone (FLONASE) 50 MCG/ACT nasal spray Place 1 spray into both nostrils daily. 04/11/22   Karie Schwalbe, MD  guaiFENesin (MUCINEX) 600 MG 12 hr tablet Take by mouth 2 (two) times daily.    [provider]  levothyroxine (SYNTHROID) 88 MCG tablet Take 1 tablet (88 mcg total) by mouth daily. 04/11/22   Karie Schwalbe, MD  metoprolol succinate (TOPROL-XL) 50 MG 24 hr tablet Take 1 tablet (50 mg total) by mouth at bedtime. Take with or immediately following a meal. 08/08/22 11/06/22  Furth, Cadence H, PA-C  Multiple Vitamin (MULTIVITAMIN) tablet Take 1 tablet by mouth daily.    [provider]  potassium chloride (KLOR-CON M) 10 MEQ tablet Take 1 tablet (10 mEq total) by mouth at bedtime. 04/11/22   Karie Schwalbe, MD  sacubitril-valsartan (ENTRESTO) 24-26 MG Take 1 tablet by mouth 2 (two) times daily. 11/06/22   End, Cristal Deer, MD  sennosides-docusate sodium (SENOKOT-S) 8.6-50 MG tablet Take 2 tablets by mouth daily. 02/06/22   Karie Schwalbe, MD  tamsulosin (FLOMAX) 0.4 MG CAPS capsule Take 1 capsule (0.4 mg total) by mouth daily after supper. 04/11/22   Karie Schwalbe, MD  XTANDI 40 MG tablet TAKE 4 TABLETS BY MOUTH DAILY 10/01/22   [name], MD     Family History  Problem Relation Age of Onset   Hypertension Mother    Stroke Mother    Gout Father    Heart attack Father 73   Heart disease Sister    Lung cancer Sister    Drug abuse Sister    Breast cancer Paternal Aunt    Lung cancer Maternal Grandmother    Colon cancer Neg Hx    Esophageal cancer Neg Hx    Rectal cancer Neg Hx    Stomach cancer Neg Hx     Social History   Socioeconomic History   Marital status: Married    Spouse name: [name]    Number of children: 0   Years of education: Not on file   Highest education level: Bachelor's degree (e.g., BA,  AB, BS)  Occupational History   Occupation: Warden/ranger: [employer]  Tobacco Use   Smoking status: Never    Passive exposure: Never   Smokeless tobacco: Never  Vaping Use   Vaping status: Never Used  Substance and Sexual Activity   Alcohol use: Not Currently    Comment: Alcohol once every few weeks/months   Drug use: No   Sexual activity: Not Currently  Other Topics Concern   Not on file  Social History Narrative   Lives at home with spouse.    Social Determinants of Health   Financial Resource Strain: Low Risk  (06/30/2022)   Overall Financial Resource Strain (CARDIA)    Difficulty of Paying Living Expenses: Not hard at all  Food Insecurity: No Food Insecurity (06/30/2022)   Hunger Vital Sign  Worried About Programme researcher, broadcasting/film/video in the Last Year: Never true    Ran Out of Food in the Last Year: Never true  Transportation Needs: No Transportation Needs (06/30/2022)   PRAPARE - Administrator, Civil Service (Medical): No    Lack of Transportation (Non-Medical): No  Physical Activity: Unknown (06/30/2022)   Exercise Vital Sign    Days of Exercise per Week: 0 days    Minutes of Exercise per Session: Not on file  Stress: No Stress Concern Present (06/30/2022)   [employer] of Occupational Health - Occupational Stress Questionnaire    Feeling of Stress : Only a little  Social Connections: Moderately Integrated (06/30/2022)   Social Connection and Isolation Panel [NHANES]    Frequency of Communication with Friends and Family: Three times a week    Frequency of Social Gatherings with Friends and Family: More than three times a week    Attends Religious Services: Never    Database administrator or Organizations: Yes    Attends Banker Meetings: 1 to 4 times per year    Marital Status: Married    ECOG Status: {CHL ONC ECOG OZ:[id number]}  Review of Systems: A 12 point ROS discussed and pertinent positives are indicated in the HPI above.   All other systems are negative.  Review of Systems  Vital Signs: There were no vitals taken for this visit.    Physical Exam  Imaging: ECHOCARDIOGRAM LIMITED  Result Date: 10/23/2022    ECHOCARDIOGRAM LIMITED REPORT   Patient Name:   PADRAIG CHIDESTER Date of Exam: 10/23/2022 Medical Rec #:  [id number]        Height:       69.0 in Accession #:    [id number]       Weight:       179.0 lb Date of Birth:  [birth year]        BSA:          1.971 m Patient Age:    66 years         BP:           127/77 mmHg Patient Gender: M                HR:           50 bpm. Exam Location:  [place] Procedure: 2D Echo, 3D Echo, Limited Echo, Color Doppler, Limited Color Doppler            and Strain Analysis Indications:    R00.0 Tachycardia; I49.49 Other premature depolarization  History:        Patient has prior history of Echocardiogram examinations, most                 recent 10/11/2021. Stroke, Arrythmias:PVC; Risk                 Factors:Hypertension and Dyslipidemia.  Sonographer:    Ilda Mori MHA, BS, RDCS Referring Phys: [id number] CADENCE H FURTH  Sonographer Comments: Suboptimal parasternal window. PT in w/c. IMPRESSIONS  1. Left ventricular ejection fraction, by estimation, is 25 to 30%. The left ventricle has severely decreased function. The left ventricle demonstrates global hypokinesis. The left ventricular internal cavity size was moderately dilated. Left ventricular diastolic parameters are consistent with Grade II diastolic dysfunction (pseudonormalization). The average left ventricular global longitudinal strain is -10.8 %. The global longitudinal strain is abnormal.  2. Right ventricular systolic function is normal. The right ventricular size is normal.  3. The mitral valve is normal in structure. Mild to moderate mitral valve regurgitation. No evidence of mitral stenosis.  4. The aortic valve is normal in structure. Aortic valve regurgitation is mild. No aortic stenosis is present.  5. The inferior  vena cava is normal in size with greater than 50% respiratory variability, suggesting right atrial pressure of 3 mmHg. FINDINGS  Left Ventricle: Left ventricular ejection fraction, by estimation, is 25 to 30%. The left ventricle has severely decreased function. The left ventricle demonstrates global hypokinesis. The average left ventricular global longitudinal strain is -10.8 %. The global longitudinal strain is abnormal. The left ventricular internal cavity size was moderately dilated. There is no left ventricular hypertrophy. Left ventricular diastolic parameters are consistent with Grade II diastolic dysfunction (pseudonormalization). Right Ventricle: The right ventricular size is normal. No increase in right ventricular wall thickness. Right ventricular systolic function is normal. Left Atrium: Left atrial size was normal in size. Right Atrium: Right atrial size was normal in size. Pericardium: There is no evidence of pericardial effusion. Mitral Valve: The mitral valve is normal in structure. Mild to moderate mitral valve regurgitation. No evidence of mitral valve stenosis. Tricuspid Valve: The tricuspid valve is normal in structure. Tricuspid valve regurgitation is mild . No evidence of tricuspid stenosis. Aortic Valve: The aortic valve is normal in structure. Aortic valve regurgitation is mild. No aortic stenosis is present. Pulmonic Valve: The pulmonic valve was normal in structure. Pulmonic valve regurgitation is not visualized. No evidence of pulmonic stenosis. Aorta: The aortic root is normal in size and structure. Venous: The inferior vena cava is normal in size with greater than 50% respiratory variability, suggesting right atrial pressure of 3 mmHg. IAS/Shunts: No atrial level shunt detected by color flow Doppler.  Diastology LV e' medial:    5.22 cm/s LV E/e' medial:  10.7 LV e' lateral:   3.59 cm/s LV E/e' lateral: 15.6  2D Longitudinal Strain 2D Strain GLS Avg:     -10.8 % MITRAL VALVE MV Area  (PHT): 2.60 cm MV Decel Time: 292 msec MV E velocity: 56.10 cm/s MV A velocity: 51.90 cm/s MV E/A ratio:  1.08 Julien Nordmann MD Electronically signed by Julien Nordmann MD Signature Date/Time: 10/23/2022/3:00:03 PM    Final     Labs:  CBC: Recent Labs    03/16/22 2253 04/25/22 0942 07/04/22 0953 10/03/22 1003  WBC 13.4* 4.0 6.5 7.7  HGB 13.3 14.2 13.9 13.5  HCT 39.2 43.4 41.4 40.4  PLT 253 276 277 240    COAGS: No results for input(s): "INR", "APTT" in the last 8760 hours.  BMP: Recent Labs    03/16/22 2253 04/25/22 0941 07/04/22 0953 09/19/22 1458 10/03/22 1002  NA 135 138  139 135 138 134*  K 3.0* 3.6  3.6 3.5 4.5 3.4*  CL 101 101  101 97* 98 99  CO2 23 28  28 27 26 28   GLUCOSE 127* 93  92 89 88 87  BUN 10 9  8 8  7* 9  CALCIUM 8.6* 9.1  9.1 8.8* 9.0 8.9  CREATININE 0.44* 0.46*  0.46* 0.47* 0.44* 0.42*  GFRNONAA >60 >60  >60 >60  --  >60    LIVER FUNCTION TESTS: Recent Labs    03/07/22 0941 04/25/22 0941 07/04/22 0953 09/19/22 1458 10/03/22 1002  BILITOT 0.9 0.7 0.9  --  0.8  AST 31 26 23   --  20  ALT 24 18 14   --  19  ALKPHOS 101 88 69  --  73  PROT 6.9 6.9 6.5  --  6.3*  ALBUMIN 3.7 3.7 3.6 3.8* 3.7     Assessment and Plan:  67 y.o. male outpatient. History of metastatic prostate cancer, neurogenic bladder with urinary retention s/p foley catheter placement for chronic UTI's, Team is requesting a suprapubic catheter placement.   *** All labs and medications are within acceptable parameters. 81 mg of ASA. NKDA. Patient has been NPO since midnight.   Risks and benefits discussed with the patient including bleeding, infection, damage to adjacent structures, bowel perforation/fistula connection, and sepsis.  All of the patient's questions were answered, patient is agreeable to proceed. Consent signed and in chart.   Thank you for this interesting consult.  I greatly enjoyed meeting ZAKAREE FINKEL and look forward to participating in their  care.  A copy of this report was sent to the requesting provider on this date.  Electronically Signed: Alene Mires, NP 11/10/2022, 8:20 PM   I spent a total of {New UJWJ:[id number]} {New Out-Pt:[id number]}  {Established Out-Pt:[id number]} in face to face in clinical consultation, greater than 50% of which was counseling/coordinating care for ***

## 2022-11-10 NOTE — Progress Notes (Signed)
Patient for IR SPT Placement on Tuesday 11/11/2022, I called and spoke with the patient on the phone and gave pre-procedure instructions. Pt was made aware to be here at 9a, last dose of ASA 81mg  was on Wed 11/05/22, NPO after MN prior to procedure as well as driver post procedure/recovery/discharge. Pt stated understanding.  Called 11/10/2022

## 2022-11-10 NOTE — Telephone Encounter (Signed)
Verbal orders given to [facility].

## 2022-11-11 ENCOUNTER — Ambulatory Visit
Admission: RE | Admit: 2022-11-11 | Discharge: 2022-11-11 | Disposition: A | Discharge status: Home / Self Care | Payer: Managed Care, Other (non HMO) | Source: Ambulatory Visit | Attending: Urology | Admitting: Urology

## 2022-11-11 ENCOUNTER — Encounter: Payer: Self-pay | Admitting: Radiology

## 2022-11-11 ENCOUNTER — Other Ambulatory Visit: Payer: Self-pay | Admitting: Urology

## 2022-11-11 ENCOUNTER — Telehealth: Payer: Self-pay

## 2022-11-11 ENCOUNTER — Other Ambulatory Visit: Payer: Self-pay

## 2022-11-11 DIAGNOSIS — Z01812 Encounter for preprocedural laboratory examination: Secondary | ICD-10-CM

## 2022-11-11 DIAGNOSIS — R339 Retention of urine, unspecified: Secondary | ICD-10-CM

## 2022-11-11 DIAGNOSIS — R338 Other retention of urine: Secondary | ICD-10-CM | POA: Insufficient documentation

## 2022-11-11 DIAGNOSIS — Z87440 Personal history of urinary (tract) infections: Secondary | ICD-10-CM | POA: Insufficient documentation

## 2022-11-11 DIAGNOSIS — N319 Neuromuscular dysfunction of bladder, unspecified: Secondary | ICD-10-CM | POA: Diagnosis present

## 2022-11-11 DIAGNOSIS — Z8546 Personal history of malignant neoplasm of prostate: Secondary | ICD-10-CM | POA: Diagnosis not present

## 2022-11-11 HISTORY — PX: IR US GUIDE BX ASP/DRAIN: IMG2392

## 2022-11-11 HISTORY — DX: Malignant (primary) neoplasm, unspecified: C80.1

## 2022-11-11 HISTORY — PX: SUPRAPUBIC CATHETER INSERTION: SUR719

## 2022-11-11 LAB — CBC
HCT: 42.5 % (ref 39.0–52.0)
Hemoglobin: 14.1 g/dL (ref 13.0–17.0)
MCH: 33.3 pg (ref 26.0–34.0)
MCHC: 33.2 g/dL (ref 30.0–36.0)
MCV: 100.5 fL — ABNORMAL HIGH (ref 80.0–100.0)
Platelets: 287 10*3/uL (ref 150–400)
RBC: 4.23 MIL/uL (ref 4.22–5.81)
RDW: 13.8 % (ref 11.5–15.5)
WBC: 6.3 10*3/uL (ref 4.0–10.5)
nRBC: 0 % (ref 0.0–0.2)

## 2022-11-11 LAB — GLUCOSE, CAPILLARY: Glucose-Capillary: 83 mg/dL (ref 70–99)

## 2022-11-11 LAB — PROTIME-INR
INR: 1.1 (ref 0.8–1.2)
Prothrombin Time: 14.1 s (ref 11.4–15.2)

## 2022-11-11 MED ORDER — LIDOCAINE HCL 1 % IJ SOLN
INTRAMUSCULAR | Status: AC
  Filled 2022-11-11: qty 20

## 2022-11-11 MED ORDER — FENTANYL CITRATE (PF) 100 MCG/2ML IJ SOLN
INTRAMUSCULAR | Status: AC
  Filled 2022-11-11: qty 2

## 2022-11-11 MED ORDER — MIDAZOLAM HCL 5 MG/5ML IJ SOLN
INTRAMUSCULAR | Status: AC | PRN
Start: 2022-11-11 — End: 2022-11-11
  Administered 2022-11-11: 1 mg via INTRAVENOUS

## 2022-11-11 MED ORDER — IOHEXOL 300 MG/ML  SOLN
5.0000 mL | Freq: Once | INTRAMUSCULAR | Status: AC | PRN
  Administered 2022-11-11: 5 mL

## 2022-11-11 MED ORDER — MIDAZOLAM HCL 2 MG/2ML IJ SOLN
INTRAMUSCULAR | Status: AC
  Filled 2022-11-11: qty 2

## 2022-11-11 MED ORDER — SODIUM CHLORIDE 0.9 % IV SOLN: INTRAVENOUS | Status: DC

## 2022-11-11 MED ORDER — LIDOCAINE HCL 1 % IJ SOLN
10.0000 mL | Freq: Once | INTRAMUSCULAR | Status: AC
  Administered 2022-11-11: 10 mL via INTRADERMAL

## 2022-11-11 MED ORDER — FENTANYL CITRATE (PF) 100 MCG/2ML IJ SOLN
INTRAMUSCULAR | Status: AC | PRN
Start: 2022-11-11 — End: 2022-11-11
  Administered 2022-11-11: 50 ug via INTRAVENOUS

## 2022-11-11 NOTE — Procedures (Signed)
Interventional Radiology Procedure Note  Procedure: Suprapubic catheter placement  Indication: Neurogenic bladder with urinary retention   Findings: Please refer to procedural dictation for full description.  Complications: None  EBL: < 10 mL  Acquanetta Belling, MD [phone]

## 2022-11-11 NOTE — Progress Notes (Signed)
Patient clinically stable post IR SPT placement per DR Mir, tolerated well. Vitals stable pre and post procedure. Received Versed 1 mg along with Fentanyl 50 mcg IV for procedure. Report given to Moreen Fowler RN post procedure/specials/16.

## 2022-11-11 NOTE — Telephone Encounter (Signed)
Called pt's wife she states that home health orders were supposed to be requested from the patient's PCP not urology. Will follow up with PCP office.

## 2022-11-11 NOTE — Telephone Encounter (Signed)
Pt is wondering if Dr.Sninsky put in orders for home health for cath and dressing change. Please call wife at [phone]

## 2022-11-12 DIAGNOSIS — G8250 Quadriplegia, unspecified: Secondary | ICD-10-CM | POA: Diagnosis not present

## 2022-11-12 DIAGNOSIS — C61 Malignant neoplasm of prostate: Secondary | ICD-10-CM | POA: Diagnosis not present

## 2022-11-12 DIAGNOSIS — Z8673 Personal history of transient ischemic attack (TIA), and cerebral infarction without residual deficits: Secondary | ICD-10-CM

## 2022-11-12 DIAGNOSIS — E785 Hyperlipidemia, unspecified: Secondary | ICD-10-CM

## 2022-11-12 DIAGNOSIS — Z7982 Long term (current) use of aspirin: Secondary | ICD-10-CM

## 2022-11-12 DIAGNOSIS — C7940 Secondary malignant neoplasm of unspecified part of nervous system: Secondary | ICD-10-CM | POA: Diagnosis not present

## 2022-11-12 DIAGNOSIS — Z86718 Personal history of other venous thrombosis and embolism: Secondary | ICD-10-CM

## 2022-11-12 DIAGNOSIS — I10 Essential (primary) hypertension: Secondary | ICD-10-CM | POA: Diagnosis not present

## 2022-11-12 DIAGNOSIS — N400 Enlarged prostate without lower urinary tract symptoms: Secondary | ICD-10-CM

## 2022-11-12 DIAGNOSIS — Z79899 Other long term (current) drug therapy: Secondary | ICD-10-CM

## 2022-11-12 DIAGNOSIS — I471 Supraventricular tachycardia, unspecified: Secondary | ICD-10-CM

## 2022-11-12 DIAGNOSIS — M4802 Spinal stenosis, cervical region: Secondary | ICD-10-CM

## 2022-11-17 ENCOUNTER — Telehealth: Payer: Self-pay | Admitting: Internal Medicine

## 2022-11-17 ENCOUNTER — Telehealth: Payer: Self-pay

## 2022-11-17 ENCOUNTER — Other Ambulatory Visit (HOSPITAL_COMMUNITY): Payer: Self-pay

## 2022-11-17 NOTE — Telephone Encounter (Signed)
Oral Oncology Patient Advocate Encounter  New authorization   Received notification that prior authorization for Nubeqa is required.   PA submitted on 11/17/22  Key [id number]  Status is pending     Ardeen Fillers, CPhT Oncology Pharmacy Patient Advocate  [facility]  [phone] (phone) [phone] (fax) 11/17/2022 10:32 AM

## 2022-11-17 NOTE — Telephone Encounter (Signed)
Oral Oncology Patient Advocate Encounter  Prior Authorization for Merleen Nicely has been approved.    PA# [id number]  Effective dates: 11/17/22 through 11/17/23  Patient must fill through [facility].    Ardeen Fillers, CPhT Oncology Pharmacy Patient Advocate  [facility]  [phone] (phone) [phone] (fax) 11/17/2022 10:37 AM

## 2022-11-17 NOTE — Telephone Encounter (Signed)
Patient dropped off document Insurance Form, to be filled out by provider. Patient requested to send it back via Call Patient to pick up within 2-days. Document is located in providers tray at front office.Please advise at Mobile [phone] (mobile)

## 2022-11-18 ENCOUNTER — Telehealth: Payer: Self-pay | Admitting: Internal Medicine

## 2022-11-18 NOTE — Telephone Encounter (Signed)
Form placed in Dr Karle Starch inbox on his desk to complete and give to pt at home visit tomorrow.

## 2022-11-18 NOTE — Telephone Encounter (Signed)
Home Health verbal orders Caller Name: cheryl  Agency Name: [facility]   Callback number: [phone], secured   Requesting OT/PT/Skilled nursing/Social Work/Speech: skilled nursing   Reason: wound care for sacrum  Frequency: once a week for 5 weeks   Please forward to [facility] pool or providers CMA

## 2022-11-19 ENCOUNTER — Ambulatory Visit (INDEPENDENT_AMBULATORY_CARE_PROVIDER_SITE_OTHER): Payer: Managed Care, Other (non HMO) | Admitting: Internal Medicine

## 2022-11-19 ENCOUNTER — Telehealth: Payer: Self-pay | Admitting: Internal Medicine

## 2022-11-19 ENCOUNTER — Encounter: Payer: Self-pay | Admitting: Internal Medicine

## 2022-11-19 VITALS — BP 128/72 | HR 60 | Resp 22

## 2022-11-19 DIAGNOSIS — Z9359 Other cystostomy status: Secondary | ICD-10-CM

## 2022-11-19 DIAGNOSIS — G8220 Paraplegia, unspecified: Secondary | ICD-10-CM | POA: Diagnosis not present

## 2022-11-19 DIAGNOSIS — Z23 Encounter for immunization: Secondary | ICD-10-CM | POA: Diagnosis not present

## 2022-11-19 DIAGNOSIS — I5020 Unspecified systolic (congestive) heart failure: Secondary | ICD-10-CM | POA: Diagnosis not present

## 2022-11-19 DIAGNOSIS — C61 Malignant neoplasm of prostate: Secondary | ICD-10-CM

## 2022-11-19 DIAGNOSIS — C7940 Secondary malignant neoplasm of unspecified part of nervous system: Secondary | ICD-10-CM

## 2022-11-19 DIAGNOSIS — F39 Unspecified mood [affective] disorder: Secondary | ICD-10-CM

## 2022-11-19 MED ORDER — ENTRESTO 24-26 MG PO TABS: 1.0000 | ORAL_TABLET | Freq: Two times a day (BID) | ORAL | 0 refills | Status: DC

## 2022-11-19 NOTE — Telephone Encounter (Signed)
Approval given to Jason [name] will call if any questions

## 2022-11-19 NOTE — Assessment & Plan Note (Signed)
Continues to work with PT Left leg 3/5, right 2+/5---arms 4/5 Wife gives care

## 2022-11-19 NOTE — Assessment & Plan Note (Signed)
Done last week Is healing as expected Goes for change next month

## 2022-11-19 NOTE — Assessment & Plan Note (Signed)
Has down days but doing okay with the fluoxetine

## 2022-11-19 NOTE — Addendum Note (Signed)
Addended by: Tillman Abide I on: 11/19/2022 03:11 PM   Modules accepted: Level of Service

## 2022-11-19 NOTE — Progress Notes (Signed)
Subjective:    Patient ID: Jason [name], male    DOB: 03/11/1955, 67 y.o.   MRN: [id number]  HPI Home visit for follow up of paraplegia and other chronic health conditions Wife here as usual  Had suprapubic catheter placed last week Still adjusting to this Is on right side--they were surprised about that Slight post op bleeding--blood on dressing Still has some redness and the sutures are in place Has had some wetness in brief  Working with PT and OT still Transitioning care to Medicare provider--so PT on hold till next month  Able to stand at sink for a while Uses hands to eat/brush teeth/comb hair Writing is messy--hard with the sensation changes in hands (tingling)  Did see Dr End Lisinopril changed to entresto--- EF 25-30% Also following with Dr Lalla Brothers No edema No orthostatic dizziness--but hasn't stood in 2 weeks (PT on hold)  Mood has been okay Some ups and downs Some days he can get "weepy"---but he pulls himself out of it  Current Outpatient Medications on File Prior to Visit  Medication Sig Dispense Refill   acetaminophen (TYLENOL) 325 MG tablet Take 1-2 tablets (325-650 mg total) by mouth every 4 (four) hours as needed for mild pain. (Patient taking differently: Take 325-650 mg by mouth 2 (two) times daily.)     ascorbic acid (VITAMIN C) 500 MG tablet Take 1 tablet (500 mg total) by mouth daily. 30 tablet 0   aspirin EC 81 MG tablet Take 1 tablet (81 mg total) by mouth daily. Swallow whole. 30 tablet 12   atorvastatin (LIPITOR) 40 MG tablet Take 1 tablet (40 mg total) by mouth daily. 90 tablet 3   cetirizine (ZYRTEC) 10 MG tablet Take 10 mg by mouth at bedtime.     clonazePAM (KLONOPIN) 0.5 MG tablet Take 1 tablet (0.5 mg total) by mouth 2 (two) times daily as needed for anxiety. 180 tablet 0   CRANBERRY PO Take 4,200 mg by mouth 2 (two) times daily. W/Vit C     famotidine (PEPCID) 20 MG tablet Take 1 tablet (20 mg total) by mouth daily. 30 tablet 0    FLUoxetine (PROZAC) 20 MG capsule Take 1 capsule (20 mg total) by mouth at bedtime. 90 capsule 3   fluticasone (FLONASE) 50 MCG/ACT nasal spray Place 1 spray into both nostrils daily. 48 g 3   guaiFENesin (MUCINEX) 600 MG 12 hr tablet Take by mouth 2 (two) times daily.     levothyroxine (SYNTHROID) 88 MCG tablet Take 1 tablet (88 mcg total) by mouth daily. 90 tablet 3   Multiple Vitamin (MULTIVITAMIN) tablet Take 1 tablet by mouth daily.     potassium chloride (KLOR-CON M) 10 MEQ tablet Take 1 tablet (10 mEq total) by mouth at bedtime. 90 tablet 3   sacubitril-valsartan (ENTRESTO) 24-26 MG Take 1 tablet by mouth 2 (two) times daily. 28 tablet 0   sennosides-docusate sodium (SENOKOT-S) 8.6-50 MG tablet Take 2 tablets by mouth daily. 60 tablet 11   tamsulosin (FLOMAX) 0.4 MG CAPS capsule Take 1 capsule (0.4 mg total) by mouth daily after supper. 90 capsule 3   XTANDI 40 MG tablet TAKE 4 TABLETS BY MOUTH DAILY 120 tablet 2   metoprolol succinate (TOPROL-XL) 50 MG 24 hr tablet Take 1 tablet (50 mg total) by mouth at bedtime. Take with or immediately following a meal. 90 tablet 3   No current facility-administered medications on file prior to visit.    No Known Allergies  Past Medical History:  Diagnosis Date   Allergy    Arthritis    osteoarthritis   Cancer (HCC)    GERD (gastroesophageal reflux disease)    History of kidney stones    Hypertension    Hypothyroidism    Palpitations 2022   Pneumonia    Stroke [facility])    Mini stroke - Aug 2023   Thyroid disease    Hyperthyroidism s/p radioactive iodine ablation    Past Surgical History:  Procedure Laterality Date   CARDIAC CATHETERIZATION  03/2000   HERNIA REPAIR  1961   umbilical   IR US GUIDE BX ASP/DRAIN  11/11/2022   LEFT HEART CATH AND CORONARY ANGIOGRAPHY Left 03/20/2020   Procedure: LEFT HEART CATH AND CORONARY ANGIOGRAPHY;  Surgeon: Yvonne Kendall, MD;  Location: [facility] INVASIVE CV LAB;  Service: Cardiovascular;  Laterality:  Left;   POSTERIOR CERVICAL FUSION/FORAMINOTOMY N/A 10/06/2021   Procedure: POSTERIOR CERVICAL FUSION/ FORAMINOTOMY LEVEL 3;  Surgeon: Venetia Night, MD;  Location: [facility] ORS;  Service: Neurosurgery;  Laterality: N/A;  will need monitoring   RADIOLOGY WITH ANESTHESIA Left 04/18/2021   Procedure: MRI SHOULDER WITHOUT CONTRAST WITH ANESTHESIA;  Surgeon: Radiologist, Medication, MD;  Location: [facility] OR;  Service: Radiology;  Laterality: Left;   RADIOLOGY WITH ANESTHESIA N/A 10/03/2021   Procedure: MRI CERVICAL SPINE WITH ANESTHESIA;  Surgeon: Radiologist, Medication, MD;  Location: [facility] OR;  Service: Radiology;  Laterality: N/A;   RADIOLOGY WITH ANESTHESIA N/A 01/30/2022   Procedure: MRI CERVICAL SPINE WITH AND WITOHOUT CONTRAST WITH ANESTHESIA;  Surgeon: Radiologist, Medication, MD;  Location: [facility] OR;  Service: Radiology;  Laterality: N/A;   RADIOLOGY WITH ANESTHESIA N/A 05/22/2022   Procedure: MRI LUMBER SPINE AND CERVIC SPINE WITH AND WITHOUT CONTRAST;  Surgeon: Radiologist, Medication, MD;  Location: [facility] OR;  Service: Radiology;  Laterality: N/A;   REVERSE SHOULDER ARTHROPLASTY Left    ROTATOR CUFF REPAIR  11/2010   Dr Hyacinth Meeker   SUPRAPUBIC CATHETER INSERTION  11/11/2022   TONSILLECTOMY     TOTAL HIP ARTHROPLASTY Right 02/24/2009   TOTAL HIP ARTHROPLASTY Left 06/2016   Dr Odis Luster    Family History  Problem Relation Age of Onset   Hypertension Mother    Stroke Mother    Gout Father    Heart attack Father 42   Heart disease Sister    Lung cancer Sister    Drug abuse Sister    Breast cancer Paternal Aunt    Lung cancer Maternal Grandmother    Colon cancer Neg Hx    Esophageal cancer Neg Hx    Rectal cancer Neg Hx    Stomach cancer Neg Hx     Social History   Socioeconomic History   Marital status: Married    Spouse name: [name]    Number of children: 0   Years of education: Not on file   Highest education level: Bachelor's degree (e.g., BA, AB, BS)  Occupational History    Occupation: Warden/ranger: [employer]  Tobacco Use   Smoking status: Never    Passive exposure: Never   Smokeless tobacco: Never  Vaping Use   Vaping status: Never Used  Substance and Sexual Activity   Alcohol use: Not Currently    Comment: Alcohol once every few weeks/months   Drug use: No   Sexual activity: Not Currently  Other Topics Concern   Not on file  Social History Narrative   Lives at home with spouse.    Social Determinants of Health   Financial Resource Strain: Low  Risk  (06/30/2022)   Overall Financial Resource Strain (CARDIA)    Difficulty of Paying Living Expenses: Not hard at all  Food Insecurity: No Food Insecurity (06/30/2022)   Hunger Vital Sign    Worried About Running Out of Food in the Last Year: Never true    Ran Out of Food in the Last Year: Never true  Transportation Needs: No Transportation Needs (06/30/2022)   PRAPARE - Administrator, Civil Service (Medical): No    Lack of Transportation (Non-Medical): No  Physical Activity: Unknown (06/30/2022)   Exercise Vital Sign    Days of Exercise per Week: 0 days    Minutes of Exercise per Session: Not on file  Stress: No Stress Concern Present (06/30/2022)   [employer] of Occupational Health - Occupational Stress Questionnaire    Feeling of Stress : Only a little  Social Connections: Moderately Integrated (06/30/2022)   Social Connection and Isolation Panel [NHANES]    Frequency of Communication with Friends and Family: Three times a week    Frequency of Social Gatherings with Friends and Family: More than three times a week    Attends Religious Services: Never    Database administrator or Organizations: Yes    Attends Banker Meetings: 1 to 4 times per year    Marital Status: Married  Catering manager Violence: Not on file   Review of Systems Sleeps okay Bowel regimen is the same---suppository in evening and goes by the morning (sometimes soon after the  suppository). Now has some sensation of moving his bowels Appetite is good--wife thinks he eats "light" Weight seems to be stable Taking mucinex twice a day    Objective:   Physical Exam Constitutional:      Appearance: Normal appearance.  Cardiovascular:     Rate and Rhythm: Normal rate and regular rhythm.     Heart sounds:     No gallop.  Pulmonary:     Effort: Pulmonary effort is normal.     Breath sounds: Normal breath sounds. No wheezing or rales.  Abdominal:     Palpations: Abdomen is soft.     Tenderness: There is no abdominal tenderness.     Comments: Suprapubic catheter in place--site is clean and not inflamed  Musculoskeletal:     Cervical back: Neck supple.     Right lower leg: No edema.     Left lower leg: No edema.  Lymphadenopathy:     Cervical: No cervical adenopathy.  Neurological:     Mental Status: He is alert.  Psychiatric:        Mood and Affect: Mood normal.        Behavior: Behavior normal.            Assessment & Plan:

## 2022-11-19 NOTE — Telephone Encounter (Signed)
Form completed Will deliver at home visit today

## 2022-11-19 NOTE — Telephone Encounter (Signed)
Pt c/o medication issue:  1. Name of Medication: sacubitril-valsartan (ENTRESTO) 24-26 MG   2. How are you currently taking this medication (dosage and times per day)?    3. Are you having a reaction (difficulty breathing--STAT)? no  4. What is your medication issue? Medication was sent to wrong address. Wife is calling to see if our office can provide samples, until they can get the medication.

## 2022-11-19 NOTE — Telephone Encounter (Signed)
Spoke with pt's wife who reported she was informed by [facility] that pt's Entresto was delivered to the wrong address. She stated [employer] is working on sending a replacement, however, she noted pt will run out tomorrow. Wife requesting samples in the interim.  Samples placed up front for pick up.

## 2022-11-19 NOTE — Assessment & Plan Note (Signed)
Last PSA 2 Still on xtandi

## 2022-11-19 NOTE — Assessment & Plan Note (Signed)
EF down on echo Now on entresto 24/26 bid  Metoprolol 50

## 2022-11-20 ENCOUNTER — Telehealth: Payer: Self-pay | Admitting: Internal Medicine

## 2022-11-20 ENCOUNTER — Other Ambulatory Visit: Payer: Self-pay

## 2022-11-20 ENCOUNTER — Other Ambulatory Visit
Admission: RE | Admit: 2022-11-20 | Discharge: 2022-11-20 | Disposition: A | Discharge status: Home / Self Care | Payer: Managed Care, Other (non HMO) | Source: Ambulatory Visit | Attending: Internal Medicine | Admitting: Internal Medicine

## 2022-11-20 DIAGNOSIS — Z79899 Other long term (current) drug therapy: Secondary | ICD-10-CM | POA: Diagnosis present

## 2022-11-20 LAB — BASIC METABOLIC PANEL
Anion gap: 11 (ref 5–15)
BUN: 11 mg/dL (ref 8–23)
CO2: 27 mmol/L (ref 22–32)
Calcium: 8.5 mg/dL — ABNORMAL LOW (ref 8.9–10.3)
Chloride: 97 mmol/L — ABNORMAL LOW (ref 98–111)
Creatinine, Ser: 0.35 mg/dL — ABNORMAL LOW (ref 0.61–1.24)
GFR, Estimated: >60 mL/min (ref >60)
Glucose, Bld: 91 mg/dL (ref 70–99)
Potassium: 3.2 mmol/L — ABNORMAL LOW (ref 3.5–5.1)
Sodium: 135 mmol/L (ref 135–145)

## 2022-11-20 MED ORDER — POTASSIUM CHLORIDE CRYS ER 20 MEQ PO TBCR: 20.0000 meq | EXTENDED_RELEASE_TABLET | Freq: Every day | ORAL | 3 refills | Status: DC

## 2022-11-20 NOTE — Telephone Encounter (Signed)
Tiffany from [facility] called in and stated that patients insurance is changing October 1st and they need a VO to continue service new insurance. They are also needing last office note faxed over to them. There fax number is [phone]. Thank you!

## 2022-11-20 NOTE — Addendum Note (Signed)
Addended by: Eual Fines on: 11/20/2022 08:39 AM   Modules accepted: Orders

## 2022-11-20 NOTE — Telephone Encounter (Signed)
Mary from [facility] called in to get a verbal on this request. She stated that she can be reached at [phone]. Thank you!

## 2022-11-20 NOTE — Telephone Encounter (Signed)
Patient will have new insurance at the beginning of October. Insurance approval will need to be obtained again  at that time. Waiting for insurance process to take place prior to proceeding with Nubeqa.

## 2022-11-20 NOTE — Telephone Encounter (Signed)
Called and gave verbal orders to continue his care on the new insurance. They got his last OV note from Epic.

## 2022-11-25 ENCOUNTER — Ambulatory Visit (HOSPITAL_COMMUNITY): Payer: Managed Care, Other (non HMO)

## 2022-11-25 ENCOUNTER — Other Ambulatory Visit (HOSPITAL_COMMUNITY): Payer: Self-pay

## 2022-11-26 ENCOUNTER — Encounter: Payer: Self-pay | Admitting: Oncology

## 2022-11-26 ENCOUNTER — Other Ambulatory Visit (HOSPITAL_COMMUNITY): Payer: Self-pay

## 2022-11-27 ENCOUNTER — Other Ambulatory Visit (HOSPITAL_COMMUNITY): Payer: Self-pay

## 2022-11-27 ENCOUNTER — Telehealth: Payer: Self-pay | Admitting: Cardiology

## 2022-11-27 ENCOUNTER — Encounter: Payer: Self-pay | Admitting: Oncology

## 2022-11-27 ENCOUNTER — Telehealth: Payer: Self-pay

## 2022-11-27 NOTE — Telephone Encounter (Signed)
Oral Oncology Patient Advocate Encounter  **Patient transitioned to Medicare Part D in October '24**  After completing a benefits investigation, prior authorization for Jason [name] is not required at this time through [facility] Part D.  Patient's copay is $3,333.02.  PAP initiated. See additional encounter.     Ardeen Fillers, CPhT Oncology Pharmacy Patient Advocate  [facility]  [phone] (phone) [phone] (fax) 11/27/2022 9:43 AM

## 2022-11-27 NOTE — Telephone Encounter (Signed)
Oral Oncology Patient Advocate Encounter   Began application for assistance for Nubeqa through [employer] [place] [employer].   Application will be submitted upon completion of necessary supporting documentation.   BUS[employer]'s phone number [phone].   I will continue to check the status until final determination.    Ardeen Fillers, CPhT Oncology Pharmacy Patient Advocate  [facility]  [phone] (phone) [phone] (fax) 11/27/2022 9:56 AM

## 2022-11-27 NOTE — Telephone Encounter (Signed)
Pt's spouse is requesting a callback regarding upcoming MRI and pre auth being in place. Please advise

## 2022-11-28 ENCOUNTER — Ambulatory Visit: Payer: Managed Care, Other (non HMO) | Admitting: Internal Medicine

## 2022-12-01 ENCOUNTER — Telehealth: Payer: Self-pay | Admitting: Internal Medicine

## 2022-12-01 ENCOUNTER — Telehealth: Payer: Self-pay | Admitting: Pharmacist

## 2022-12-01 DIAGNOSIS — C61 Malignant neoplasm of prostate: Secondary | ICD-10-CM

## 2022-12-01 NOTE — Telephone Encounter (Signed)
Tiffany from [facility] called stating Dr. Alphonsus Sias visited the pt on 11/19/22. Tiffany states they received the notes from the visit, however the last page was cut off. Tiffany also mentioned how there wasn't any electronic signature or date attached to notes. Tiffany asked if notes could be faxed again with electronic signature & date? Fax # [phone]. Call back # [phone]

## 2022-12-01 NOTE — Telephone Encounter (Signed)
Clinical Pharmacist Practitioner Encounter   Received new prescription for Nubeqa (darolutamide) for the treatment of metastatic castration sensitive prostate cancer in conjunction with ADT, planned duration until disease progression or unacceptable drug toxicity. The plan is for the patient to transition from enzalutamide treatment due to cardiology issues/drug interactions.   BMP from 11/20/22 and CMP from 07/04/22 assessed, no relevant lab abnormalities. Prescription dose and frequency assessed.   Current medication list in Epic reviewed, a few DDIs with darolutamide identified: Atorvastatin: Darolutamide may increase the serum concentration of atorvastatin. Monitor patients for increased toxicities and effects of atorvastatin. No baseline dose adjustments required.  Sacubitril-valsartan: Darolutamide may increase the serum concentration of sacubitril-valsartan. Monitor patients for increased toxicities and effects of sacubitril-valsartan. No baseline dose adjustments required.   Evaluated chart and no patient barriers to medication adherence identified.   Prescription has been e-scribed to the [facility] for benefits analysis and approval.  Oral Oncology Clinic will continue to follow for insurance authorization, copayment issues, initial counseling and start date.   Remi Haggard, PharmD, BCPS, BCOP, CPP Hematology/Oncology Clinical Pharmacist Practitioner [place] [phone]  12/01/2022 1:59 PM

## 2022-12-01 NOTE — Telephone Encounter (Signed)
Resent note using the Notes tab and not the encounter. It says signed electronically.

## 2022-12-01 NOTE — Telephone Encounter (Signed)
Oral Oncology Patient Advocate Encounter  Reached out and spoke with patient regarding PAP paperwork, explained that I would send it to their preferred email via DocuSign.   Confirmed email address: [email or web address].    Patient expressed understanding and consent.  Will follow up once paperwork has been signed and returned.   Ardeen Fillers, CPhT Oncology Pharmacy Patient Advocate  [facility]  [phone] (phone) [phone] (fax) 12/01/2022 12:44 PM

## 2022-12-01 NOTE — Telephone Encounter (Signed)
Thayer Ohm from [facility] called in needed verbal orders for the pt for 2x 8 weeks physical therapy. Can be reached at [phone].

## 2022-12-01 NOTE — Telephone Encounter (Signed)
Received back patient signatures. I will submit once MD signatures are in hand. I will continue to follow and update until final determination.    Ardeen Fillers, CPhT Oncology Pharmacy Patient Advocate  [facility]  [phone] (phone) [phone] (fax) 12/01/2022 1:04 PM

## 2022-12-01 NOTE — Telephone Encounter (Signed)
Verbal orders given to [name] 

## 2022-12-02 ENCOUNTER — Other Ambulatory Visit (HOSPITAL_COMMUNITY): Payer: Self-pay

## 2022-12-02 NOTE — Telephone Encounter (Signed)
Oral Oncology Patient Advocate Encounter   Submitted application for assistance for Nubeqa to [employer] [place] [employer].   Application submitted via e-fax to [phone]   [employer]PA[employer]'s phone number [phone].   I will continue to check the status until final determination.    Ardeen Fillers, CPhT Oncology Pharmacy Patient Advocate  [facility]  [phone] (phone) [phone] (fax) 12/02/2022 9:26 AM

## 2022-12-02 NOTE — Telephone Encounter (Signed)
Clinical Pharmacist Practitioner Encounter   Patient Education I spoke with patient's wife Jason [name] for overview of new oral chemotherapy medication: Nubeqa (darolutamide) for the treatment of metastatic castration sensitive prostate cancer in conjunction with ADT, planned duration until disease progression or unacceptable drug toxicity. The plan is for the patient to transition from enzalutamide treatment due to cardiology issues/drug interactions.   **Once he has medication in hand, patient will stop the enzalutamide then the next day start the darolutamide. He will see DR. Yu for follow-up after his medication change.    Counseled Jason [name] on administration, dosing, side effects, monitoring, drug-food interactions, safe handling, storage, and disposal. Patient will take 2 tablet (600 mg) by mouth 2 (two) times daily with a meal.   Side effects include but not limited to: decreased wbc, changes in liver function.    Reviewed with Jason [name] importance of keeping a medication schedule and plan for any missed doses.  After discussion with Jason [name] no patient barriers to medication adherence identified.   Jason [name]  voiced understanding and appreciation. All questions answered.   Provided [facility] with Oral Chemotherapy Navigation Clinic phone number. Jason [name] knows to call the office with questions or concerns. Oral Chemotherapy Navigation Clinic will continue to follow.  Remi Haggard, PharmD, BCPS, BCOP, CPP Hematology/Oncology Clinical Pharmacist Practitioner [place] [phone]  12/02/2022 11:04 AM

## 2022-12-05 ENCOUNTER — Ambulatory Visit: Payer: Self-pay | Admitting: Internal Medicine

## 2022-12-05 NOTE — Telephone Encounter (Addendum)
Oral Oncology Patient Advocate Encounter   Received notification that the application for assistance for Nubeqa through [place] [employer] has been approved.   [employer]PA[employer]'s phone number [phone].   Effective dates: 12/04/22 through 02/24/23  Medication will be filled at [facility]  I have spoken to the patient and they have scheduled delivery for Monday, 12/08/22.  Per MD and patient request, they will finish out supply of Xtandi (10 or 12 days supply left) and will switch to [place] afterwards.   Anticipated start date of ~12/16/22 to 12/18/22. Patient or patient's wife, Jason [name], will call to give an exact start date.  Patient also knows to call me at [phone] with any questions or concerns regarding receiving medication from Patient Assistance Foundation.    Ardeen Fillers, CPhT Oncology Pharmacy Patient Advocate  [facility]  [phone] (phone) [phone] (fax) 12/05/2022 2:02 PM

## 2022-12-10 NOTE — Progress Notes (Addendum)
Cardiology Office Note:    Date:  12/15/2022   ID:  Jason [name], DOB [birth year], MRN [id number]  PCP:  Karie Schwalbe, MD  [facility] Cardiologist:  Yvonne Kendall, MD  [facility] Electrophysiologist:  Lanier Prude, MD   Referring MD: Karie Schwalbe, MD   Chief Complaint: 3 month follow-up  History of Present Illness:    Jason [name] is a 67 y.o. male with a hx of  metastatic prostate cancer, h/o stroke, HTN, thyroid disease, GERD, osteoarthritis, and PVCs who presents for 3 month follow-up.   The patient underwent remote stress test in 2002 which led to cardiac cath that showed no significant CAD.    The patient was evaluated for palpitations in 2022. He underwent cardiac cath 02/2020 that showed no significant CAD, normal LV function with mildly elevated filling pressures consistent with diastolic dysfunction. He was started on metorpolol for PVCs.    Seen in the office 03/30/20 reporting palpitations. A heart monitor was ordered that showed SR, rare PVC/PACs, NSVT, PSVT. He was placed on Toprol. Echo 06/2020 showed LVEF 50-55%, no WMA, mild LVH, G1DD, RVSF, normal RV size and function.   Patient was seen in June 2024 reporting low heart rate into the 30s and 40s.  EKG showed normal sinus rhythm, heart rate 60 to 70s with frequent PVCs.  A 2-week heart monitor was ordered and labs were drawn.  Heart monitor showed predominantly normal sinus rhythm with an average heart rate of 50, rare PACs and frequent PVCs 15.8% burden, 6 episodes of nonsustained VT, 11 runs of SVT, lasting up to 14 seconds. Echo showed LVEF 25-30%, G2DD, mild to mod MR. The patient was referred to EP, who recommended cMRI. Lisinopril was stopped and he was started on Entresto.   Today, the patient is overall doing well. He is feeling tired today. He denies heart racing r skipping beats. He has the cMRI scheduled for later this month. He denies chest pain, SOB, LLE, orthopnea or pnd.    Past  Medical History:  Diagnosis Date   Allergy    Arthritis    osteoarthritis   Cancer (HCC)    GERD (gastroesophageal reflux disease)    History of kidney stones    Hypertension    Hypothyroidism    Palpitations 2022   Pneumonia    Stroke [facility])    Mini stroke - Aug 2023   Thyroid disease    Hyperthyroidism s/p radioactive iodine ablation    Past Surgical History:  Procedure Laterality Date   CARDIAC CATHETERIZATION  03/2000   HERNIA REPAIR  1961   umbilical   IR CATHETER TUBE CHANGE  12/12/2022   IR US GUIDE BX ASP/DRAIN  11/11/2022   LEFT HEART CATH AND CORONARY ANGIOGRAPHY Left 03/20/2020   Procedure: LEFT HEART CATH AND CORONARY ANGIOGRAPHY;  Surgeon: Yvonne Kendall, MD;  Location: [facility] INVASIVE CV LAB;  Service: Cardiovascular;  Laterality: Left;   POSTERIOR CERVICAL FUSION/FORAMINOTOMY N/A 10/06/2021   Procedure: POSTERIOR CERVICAL FUSION/ FORAMINOTOMY LEVEL 3;  Surgeon: Venetia Night, MD;  Location: [facility] ORS;  Service: Neurosurgery;  Laterality: N/A;  will need monitoring   RADIOLOGY WITH ANESTHESIA Left 04/18/2021   Procedure: MRI SHOULDER WITHOUT CONTRAST WITH ANESTHESIA;  Surgeon: Radiologist, Medication, MD;  Location: [facility] OR;  Service: Radiology;  Laterality: Left;   RADIOLOGY WITH ANESTHESIA N/A 10/03/2021   Procedure: MRI CERVICAL SPINE WITH ANESTHESIA;  Surgeon: Radiologist, Medication, MD;  Location: [facility] OR;  Service: Radiology;  Laterality: N/A;  RADIOLOGY WITH ANESTHESIA N/A 01/30/2022   Procedure: MRI CERVICAL SPINE WITH AND WITOHOUT CONTRAST WITH ANESTHESIA;  Surgeon: Radiologist, Medication, MD;  Location: [facility] OR;  Service: Radiology;  Laterality: N/A;   RADIOLOGY WITH ANESTHESIA N/A 05/22/2022   Procedure: MRI LUMBER SPINE AND CERVIC SPINE WITH AND WITHOUT CONTRAST;  Surgeon: Radiologist, Medication, MD;  Location: [facility] OR;  Service: Radiology;  Laterality: N/A;   REVERSE SHOULDER ARTHROPLASTY Left    ROTATOR CUFF REPAIR  11/2010   Dr Hyacinth Meeker   SUPRAPUBIC  CATHETER INSERTION  11/11/2022   TONSILLECTOMY     TOTAL HIP ARTHROPLASTY Right 02/24/2009   TOTAL HIP ARTHROPLASTY Left 06/2016   Dr Odis Luster    Current Medications: Current Meds  Medication Sig   acetaminophen (TYLENOL) 500 MG tablet Take 1,000 mg by mouth 2 (two) times daily.   amoxicillin (AMOXIL) 500 MG capsule Take 2,000 mg by mouth See admin instructions. Take 2000 mg 1 hour prior to dental work   ascorbic acid (VITAMIN C) 500 MG tablet Take 1 tablet (500 mg total) by mouth daily.   aspirin EC 81 MG tablet Take 1 tablet (81 mg total) by mouth daily. Swallow whole. (Patient taking differently: Take 162 mg by mouth daily. Swallow whole.)   atorvastatin (LIPITOR) 40 MG tablet Take 1 tablet (40 mg total) by mouth daily.   cetirizine (ZYRTEC) 10 MG tablet Take 10 mg by mouth at bedtime.   clonazePAM (KLONOPIN) 0.5 MG tablet Take 1 tablet (0.5 mg total) by mouth 2 (two) times daily as needed for anxiety. (Patient taking differently: Take 0.5 mg by mouth 2 (two) times daily.)   CRANBERRY PO Take 1 tablet by mouth 2 (two) times daily.   empagliflozin (JARDIANCE) 10 MG TABS tablet Take 1 tablet (10 mg total) by mouth daily before breakfast.   famotidine (PEPCID) 20 MG tablet Take 1 tablet (20 mg total) by mouth daily. (Patient taking differently: Take 20 mg by mouth at bedtime.)   FLUoxetine (PROZAC) 20 MG capsule Take 1 capsule (20 mg total) by mouth at bedtime.   fluticasone (FLONASE) 50 MCG/ACT nasal spray Place 1 spray into both nostrils daily.   guaiFENesin (MUCINEX) 600 MG 12 hr tablet Take 600 mg by mouth 2 (two) times daily.   levothyroxine (SYNTHROID) 88 MCG tablet Take 1 tablet (88 mcg total) by mouth daily.   metoprolol succinate (TOPROL-XL) 50 MG 24 hr tablet Take 1 tablet (50 mg total) by mouth at bedtime. Take with or immediately following a meal.   Multiple Vitamin (MULTIVITAMIN) tablet Take 1 tablet by mouth daily.   potassium chloride (KLOR-CON M) 20 MEQ tablet Take 1 tablet  (20 mEq total) by mouth at bedtime.   sacubitril-valsartan (ENTRESTO) 24-26 MG Take 1 tablet by mouth 2 (two) times daily.   sennosides-docusate sodium (SENOKOT-S) 8.6-50 MG tablet Take 2 tablets by mouth daily.   tamsulosin (FLOMAX) 0.4 MG CAPS capsule Take 1 capsule (0.4 mg total) by mouth daily after supper. (Patient taking differently: Take 0.4 mg by mouth daily.)   XTANDI 40 MG tablet TAKE 4 TABLETS BY MOUTH DAILY     Allergies:   Patient has no known allergies.   Social History   Socioeconomic History   Marital status: Married    Spouse name: [name]    Number of children: 0   Years of education: Not on file   Highest education level: Bachelor's degree (e.g., BA, AB, BS)  Occupational History   Occupation: Warden/ranger: [employer]  Tobacco Use   Smoking status: Never    Passive exposure: Never   Smokeless tobacco: Never  Vaping Use   Vaping status: Never Used  Substance and Sexual Activity   Alcohol use: Not Currently    Comment: Alcohol once every few weeks/months   Drug use: No   Sexual activity: Not Currently  Other Topics Concern   Not on file  Social History Narrative   Lives at home with spouse.    Social Determinants of Health   Financial Resource Strain: Low Risk  (06/30/2022)   Overall Financial Resource Strain (CARDIA)    Difficulty of Paying Living Expenses: Not hard at all  Food Insecurity: No Food Insecurity (06/30/2022)   Hunger Vital Sign    Worried About Running Out of Food in the Last Year: Never true    Ran Out of Food in the Last Year: Never true  Transportation Needs: No Transportation Needs (06/30/2022)   PRAPARE - Administrator, Civil Service (Medical): No    Lack of Transportation (Non-Medical): No  Physical Activity: Unknown (06/30/2022)   Exercise Vital Sign    Days of Exercise per Week: 0 days    Minutes of Exercise per Session: Not on file  Stress: No Stress Concern Present (06/30/2022)   [employer] of  Occupational Health - Occupational Stress Questionnaire    Feeling of Stress : Only a little  Social Connections: Moderately Integrated (06/30/2022)   Social Connection and Isolation Panel [NHANES]    Frequency of Communication with Friends and Family: Three times a week    Frequency of Social Gatherings with Friends and Family: More than three times a week    Attends Religious Services: Never    Database administrator or Organizations: Yes    Attends Engineer, structural: 1 to 4 times per year    Marital Status: Married     Family History: The patient's family history includes Breast cancer in his paternal aunt; Drug abuse in his sister; Gout in his father; Heart attack (age of onset: 20) in his father; Heart disease in his sister; Hypertension in his mother; Lung cancer in his maternal grandmother and sister; Stroke in his mother. There is no history of Colon cancer, Esophageal cancer, Rectal cancer, or Stomach cancer.  ROS:   Please see the history of present illness.     All other systems reviewed and are negative.  EKGs/Labs/Other Studies Reviewed:    The following studies were reviewed today: Echo limited bubble study 09/2030 1. Left ventricular ejection fraction, by estimation, is 60 to 65%. The  left ventricle has normal function. The left ventricle has no regional  wall motion abnormalities. Left ventricular diastolic parameters are  indeterminate.   2. Right ventricular systolic function is normal. The right ventricular  size is normal   3. Agitated saline contrast bubble study was negative, with no evidence  of any interatrial shun   4. Limited study    Echo 09/2021 1. Left ventricular ejection fraction, by estimation, is 55 to 60%. The  left ventricle has normal function. The left ventricle has no regional  wall motion abnormalities. Left ventricular diastolic parameters were  normal.   2. Right ventricular systolic function is normal. The right ventricular   size is normal.   3. The mitral valve is normal in structure. Mild mitral valve  regurgitation. No evidence of mitral stenosis.   4. The aortic valve is normal in structure. Aortic valve regurgitation is  not  visualized. Mild aortic valve stenosis.   5. The inferior vena cava is normal in size with greater than 50%  respiratory variability, suggesting right atrial pressure of 3 mmHg.   EKG:  EKG is ordered today.  The ekg ordered today demonstrates NSR, 60bpm, PVCs, nonspecific T wave changes  Recent Labs: 08/08/2022: Magnesium 2.2 09/19/2022: TSH 5.220 10/03/2022: ALT 19 11/11/2022: Hemoglobin 14.1; Platelets 287 12/12/2022: BUN 12; Creatinine, Ser 0.51; Potassium 4.0; Sodium 141  Recent Lipid Panel    Component Value Date/Time   CHOL 123 09/19/2022 1458   TRIG 74 09/19/2022 1458   HDL 52 09/19/2022 1458   CHOLHDL 2.4 09/19/2022 1458   CHOLHDL 3.9 07/25/2020 1657   VLDL 15 07/25/2020 1657   LDLCALC 56 09/19/2022 1458   LDLDIRECT 62 10/10/2021 0455     Physical Exam:    VS:  BP 122/64 (BP Location: Right Arm, Patient Position: Sitting, Cuff Size: Normal)   Pulse 60   Ht 5\' 9"  (1.753 m)   Wt 179 lb (81.2 kg)   SpO2 93%   BMI 26.43 kg/m     Wt Readings from Last 3 Encounters:  12/12/22 179 lb (81.2 kg)  12/12/22 179 lb (81.2 kg)  11/11/22 179 lb (81.2 kg)     GEN:  Well nourished, well developed in no acute distress HEENT: Normal NECK: No JVD; No carotid bruits LYMPHATICS: No lymphadenopathy CARDIAC: RRR, no murmurs, rubs, gallops RESPIRATORY:  Clear to auscultation without rales, wheezing or rhonchi  ABDOMEN: Soft, non-tender, non-distended MUSCULOSKELETAL:  No edema; No deformity  SKIN: Warm and dry NEUROLOGIC:  Alert and oriented x 3 PSYCHIATRIC:  Normal affect   ASSESSMENT:    1. PVC's (premature ventricular contractions)   2. HFrEF (heart failure with reduced ejection fraction) (HCC)   3. Hypokalemia   4. Essential hypertension    PLAN:    In order  of problems listed above:  Frequent PVCs Patient is following with EP. Plan for cMRI. EKG today shows NSR with frequent PVCs. Continue Toprol 50mg  daily, unable to titrate given borderling heart rate.   HFrEF The patient is euvolemic on exam. Continue Entresto and Toprol. I will add on Jardiance, he will likely require patient assistance for this. BMET today and BMET in 2  weeks. We will look to add spiro at follow-up, BP may limit this.   Hypokalemia BMET today and BMET in 2 weeks.  HTN BP is good today, continue current medications.    Disposition: Follow up in 2 month(s) with MD/APP    Signed, Ayse Mccartin David Stall, PA-C  12/15/2022 10:29 AM    [facility]

## 2022-12-11 ENCOUNTER — Other Ambulatory Visit: Payer: Self-pay | Admitting: Radiology

## 2022-12-11 ENCOUNTER — Encounter: Payer: Self-pay | Admitting: Oncology

## 2022-12-11 NOTE — Progress Notes (Signed)
Patient for IR Cath Tube Change (upsize) on Friday 12/12/2022, I called and spoke with the patient's wife, Jason [name] on the phone and gave pre-procedure instructions. Jason [name] was made aware to have the patient here at 9a, NPO after MN prior to procedure as well as driver post procedure/recovery/discharge. Jason [name] stated understanding.  Called 12/11/2022

## 2022-12-11 NOTE — Consult Note (Signed)
Chief Complaint: Neurogenic bladder with urinary retention. Patient present for exchange of 16 Fr suprapubic catheter to council tip.   Referring Physician(s): Dr. Legrand Rams  Supervising Physician: Richarda Overlie  Patient Status: [facility] - Out-pt  History of Present Illness: Jason [name] is a 67 y.o. male outpatient. History of metastatic prostate cancer, neurogenic bladder with urinary retention, chronic UTI's in the presence of a foley catheter. IR placed  a 16 Fr suprapubic multipurpose pigtail catheter on 9.17.24. Patient presents for exchange to council tip with possible upsize.    Wife at bedside. Patient alert and laying in bed,calm. Denies any fevers, headache, chest pain, SOB, cough, abdominal pain, nausea, vomiting or bleeding. Return precautions and treatment recommendations and follow-up discussed with the patient and his wife. Both who are agreeable with the plan.     Past Medical History:  Diagnosis Date   Allergy    Arthritis    osteoarthritis   Cancer (HCC)    GERD (gastroesophageal reflux disease)    History of kidney stones    Hypertension    Hypothyroidism    Palpitations 2022   Pneumonia    Stroke [facility])    Mini stroke - Aug 2023   Thyroid disease    Hyperthyroidism s/p radioactive iodine ablation    Past Surgical History:  Procedure Laterality Date   CARDIAC CATHETERIZATION  03/2000   HERNIA REPAIR  1961   umbilical   IR US GUIDE BX ASP/DRAIN  11/11/2022   LEFT HEART CATH AND CORONARY ANGIOGRAPHY Left 03/20/2020   Procedure: LEFT HEART CATH AND CORONARY ANGIOGRAPHY;  Surgeon: Yvonne Kendall, MD;  Location: [facility] INVASIVE CV LAB;  Service: Cardiovascular;  Laterality: Left;   POSTERIOR CERVICAL FUSION/FORAMINOTOMY N/A 10/06/2021   Procedure: POSTERIOR CERVICAL FUSION/ FORAMINOTOMY LEVEL 3;  Surgeon: Venetia Night, MD;  Location: [facility] ORS;  Service: Neurosurgery;  Laterality: N/A;  will need monitoring   RADIOLOGY WITH ANESTHESIA Left  04/18/2021   Procedure: MRI SHOULDER WITHOUT CONTRAST WITH ANESTHESIA;  Surgeon: Radiologist, Medication, MD;  Location: [facility] OR;  Service: Radiology;  Laterality: Left;   RADIOLOGY WITH ANESTHESIA N/A 10/03/2021   Procedure: MRI CERVICAL SPINE WITH ANESTHESIA;  Surgeon: Radiologist, Medication, MD;  Location: [facility] OR;  Service: Radiology;  Laterality: N/A;   RADIOLOGY WITH ANESTHESIA N/A 01/30/2022   Procedure: MRI CERVICAL SPINE WITH AND WITOHOUT CONTRAST WITH ANESTHESIA;  Surgeon: Radiologist, Medication, MD;  Location: [facility] OR;  Service: Radiology;  Laterality: N/A;   RADIOLOGY WITH ANESTHESIA N/A 05/22/2022   Procedure: MRI LUMBER SPINE AND CERVIC SPINE WITH AND WITHOUT CONTRAST;  Surgeon: Radiologist, Medication, MD;  Location: [facility] OR;  Service: Radiology;  Laterality: N/A;   REVERSE SHOULDER ARTHROPLASTY Left    ROTATOR CUFF REPAIR  11/2010   Dr Hyacinth Meeker   SUPRAPUBIC CATHETER INSERTION  11/11/2022   TONSILLECTOMY     TOTAL HIP ARTHROPLASTY Right 02/24/2009   TOTAL HIP ARTHROPLASTY Left 06/2016   Dr Odis Luster    Allergies: Patient has no known allergies.  Medications: Prior to Admission medications   Medication Sig Start Date End Date Taking? Authorizing Provider  acetaminophen (TYLENOL) 500 MG tablet Take 1,000 mg by mouth 2 (two) times daily.    [provider]  amoxicillin (AMOXIL) 500 MG capsule Take 2,000 mg by mouth See admin instructions. Take 2000 mg 1 hour prior to dental work    [provider]  ascorbic acid (VITAMIN C) 500 MG tablet Take 1 tablet (500 mg total) by mouth daily. 11/19/21  AngiulliTaino, Schmied, PA-C  aspirin EC 81 MG tablet Take 1 tablet (81 mg total) by mouth daily. Swallow whole. Patient taking differently: Take 162 mg by mouth daily. Swallow whole. 10/18/21   Enedina Finner, MD  atorvastatin (LIPITOR) 40 MG tablet Take 1 tablet (40 mg total) by mouth daily. 04/11/22   Karie Schwalbe, MD  cetirizine (ZYRTEC) 10 MG tablet Take 10 mg by mouth at  bedtime.    [provider]  clonazePAM (KLONOPIN) 0.5 MG tablet Take 1 tablet (0.5 mg total) by mouth 2 (two) times daily as needed for anxiety. Patient taking differently: Take 0.5 mg by mouth 2 (two) times daily. 10/14/22   Karie Schwalbe, MD  CRANBERRY PO Take 1 tablet by mouth 2 (two) times daily.    [provider]  darolutamide (NUBEQA) 300 MG tablet Take 600 mg by mouth 2 (two) times daily with a meal.    [name], MD  famotidine (PEPCID) 20 MG tablet Take 1 tablet (20 mg total) by mouth daily. Patient taking differently: Take 20 mg by mouth at bedtime. 11/19/21   Angiulli, Mcarthur Rossetti, PA-C  FLUoxetine (PROZAC) 20 MG capsule Take 1 capsule (20 mg total) by mouth at bedtime. 04/11/22   Karie Schwalbe, MD  fluticasone (FLONASE) 50 MCG/ACT nasal spray Place 1 spray into both nostrils daily. 04/11/22   Karie Schwalbe, MD  guaiFENesin (MUCINEX) 600 MG 12 hr tablet Take 600 mg by mouth 2 (two) times daily.    [provider]  levothyroxine (SYNTHROID) 88 MCG tablet Take 1 tablet (88 mcg total) by mouth daily. 04/11/22   Karie Schwalbe, MD  metoprolol succinate (TOPROL-XL) 50 MG 24 hr tablet Take 1 tablet (50 mg total) by mouth at bedtime. Take with or immediately following a meal. 08/08/22 12/11/22  Furth, Cadence H, PA-C  Multiple Vitamin (MULTIVITAMIN) tablet Take 1 tablet by mouth daily.    [provider]  potassium chloride (KLOR-CON M) 20 MEQ tablet Take 1 tablet (20 mEq total) by mouth at bedtime. 11/20/22   End, Cristal Deer, MD  sacubitril-valsartan (ENTRESTO) 24-26 MG Take 1 tablet by mouth 2 (two) times daily. 11/19/22   End, Cristal Deer, MD  sennosides-docusate sodium (SENOKOT-S) 8.6-50 MG tablet Take 2 tablets by mouth daily. 02/06/22   Karie Schwalbe, MD  tamsulosin (FLOMAX) 0.4 MG CAPS capsule Take 1 capsule (0.4 mg total) by mouth daily after supper. Patient taking differently: Take 0.4 mg by mouth daily. 04/11/22   Karie Schwalbe, MD   XTANDI 40 MG tablet TAKE 4 TABLETS BY MOUTH DAILY 10/01/22   [name], MD     Family History  Problem Relation Age of Onset   Hypertension Mother    Stroke Mother    Gout Father    Heart attack Father 51   Heart disease Sister    Lung cancer Sister    Drug abuse Sister    Breast cancer Paternal Aunt    Lung cancer Maternal Grandmother    Colon cancer Neg Hx    Esophageal cancer Neg Hx    Rectal cancer Neg Hx    Stomach cancer Neg Hx     Social History   Socioeconomic History   Marital status: Married    Spouse name: [name]    Number of children: 0   Years of education: Not on file   Highest education level: Bachelor's degree (e.g., BA, AB, BS)  Occupational History   Occupation: Clinical cytogeneticist  Employer: [employer]  Tobacco Use   Smoking status: Never    Passive exposure: Never   Smokeless tobacco: Never  Vaping Use   Vaping status: Never Used  Substance and Sexual Activity   Alcohol use: Not Currently    Comment: Alcohol once every few weeks/months   Drug use: No   Sexual activity: Not Currently  Other Topics Concern   Not on file  Social History Narrative   Lives at home with spouse.    Social Determinants of Health   Financial Resource Strain: Low Risk  (06/30/2022)   Overall Financial Resource Strain (CARDIA)    Difficulty of Paying Living Expenses: Not hard at all  Food Insecurity: No Food Insecurity (06/30/2022)   Hunger Vital Sign    Worried About Running Out of Food in the Last Year: Never true    Ran Out of Food in the Last Year: Never true  Transportation Needs: No Transportation Needs (06/30/2022)   PRAPARE - Administrator, Civil Service (Medical): No    Lack of Transportation (Non-Medical): No  Physical Activity: Unknown (06/30/2022)   Exercise Vital Sign    Days of Exercise per Week: 0 days    Minutes of Exercise per Session: Not on file  Stress: No Stress Concern Present (06/30/2022)   [employer] of Occupational Health -  Occupational Stress Questionnaire    Feeling of Stress : Only a little  Social Connections: Moderately Integrated (06/30/2022)   Social Connection and Isolation Panel [NHANES]    Frequency of Communication with Friends and Family: Three times a week    Frequency of Social Gatherings with Friends and Family: More than three times a week    Attends Religious Services: Never    Database administrator or Organizations: Yes    Attends Banker Meetings: 1 to 4 times per year    Marital Status: Married     Review of Systems: A 12 point ROS discussed and pertinent positives are indicated in the HPI above.  All other systems are negative.  Review of Systems  Constitutional:  Negative for fever.  HENT:  Negative for congestion.   Respiratory:  Negative for cough and shortness of breath.   Cardiovascular:  Negative for chest pain.  Gastrointestinal:  Negative for abdominal pain.  Neurological:  Negative for headaches.  Psychiatric/Behavioral:  Negative for behavioral problems and confusion.     Vital Signs: BP (!) 151/88   Pulse 71   Temp 97.6 F (36.4 C) (Oral)   Resp 16   Ht 5\' 9"  (1.753 m)   Wt 179 lb (81.2 kg)   SpO2 92%   BMI 26.43 kg/m   Advance Care Plan: The advanced care plan/surrogate decision maker was discussed at the time of visit and documented in the medical record. wife    Physical Exam Vitals and nursing note reviewed.  Constitutional:      Appearance: He is well-developed. He is ill-appearing.  HENT:     Head: Normocephalic.     Mouth/Throat:     Mouth: Mucous membranes are moist.  Cardiovascular:     Rate and Rhythm: Bradycardia present. Rhythm irregular.  Pulmonary:     Effort: Pulmonary effort is normal.  Genitourinary:    Comments: Mild erythema with excoriation @ 4 o'clock  noted around the catheter exit site.  Musculoskeletal:        General: Normal range of motion.     Cervical back: Normal range of motion.  Skin:  General: Skin is  dry.     Coloration: Skin is pale.  Neurological:     Mental Status: He is alert and oriented to person, place, and time.  Psychiatric:        Mood and Affect: Mood normal.        Behavior: Behavior normal.        Thought Content: Thought content normal.        Judgment: Judgment normal.     Imaging: No results found.  Labs:  CBC: Recent Labs    04/25/22 0942 07/04/22 0953 10/03/22 1003 11/11/22 0938  WBC 4.0 6.5 7.7 6.3  HGB 14.2 13.9 13.5 14.1  HCT 43.4 41.4 40.4 42.5  PLT 276 277 240 287    COAGS: Recent Labs    11/11/22 0938  INR 1.1    BMP: Recent Labs    04/25/22 0941 07/04/22 0953 09/19/22 1458 10/03/22 1002 11/20/22 1156  NA 138  139 135 138 134* 135  K 3.6  3.6 3.5 4.5 3.4* 3.2*  CL 101  101 97* 98 99 97*  CO2 28  28 27 26 28 27   GLUCOSE 93  92 89 88 87 91  BUN 9  8 8  7* 9 11  CALCIUM 9.1  9.1 8.8* 9.0 8.9 8.5*  CREATININE 0.46*  0.46* 0.47* 0.44* 0.42* 0.35*  GFRNONAA >60  >60 >60  --  >60 >60    LIVER FUNCTION TESTS: Recent Labs    03/07/22 0941 04/25/22 0941 07/04/22 0953 09/19/22 1458 10/03/22 1002  BILITOT 0.9 0.7 0.9  --  0.8  AST 31 26 23   --  20  ALT 24 18 14   --  19  ALKPHOS 101 88 69  --  73  PROT 6.9 6.9 6.5  --  6.3*  ALBUMIN 3.7 3.7 3.6 3.8* 3.7      Assessment and Plan:  67 y.o. male outpatient. History of metastatic prostate cancer, neurogenic bladder with urinary retention, chronic UTI's in the presence of the a foley catheter. IR placed a 16 Fr suprapubic multipurpose pigtail catheter on 9.17.24. Patient presents for  exchange to council tip.    Labs pending.  81 mg of ASA.   All other labs and medications are within acceptable parameters. NKDA. Patient has been NPO since midnight.   Risks and benefits discussed with the patient including bleeding, infection, damage to adjacent structures, bowel perforation/fistula connection, and sepsis.  All of the patient's questions were answered, patient and his  wife. Both who are  agreeable to proceed. Consent signed and in chart.   Thank you for this interesting consult.  I greatly enjoyed meeting KELDRIC SLAGHT and look forward to participating in their care.  A copy of this report was sent to the requesting provider on this date.  Electronically Signed: Alene Mires, NP 12/12/2022, 10:18 AM   I spent a total of    15 Minutes in face to face in clinical consultation, greater than 50% of which was counseling/coordinating care for suprapubic catheter exchange to council tip

## 2022-12-12 ENCOUNTER — Ambulatory Visit
Admission: RE | Admit: 2022-12-12 | Discharge: 2022-12-12 | Disposition: A | Discharge status: Home / Self Care | Payer: Medicare Other | Source: Ambulatory Visit | Attending: Interventional Radiology | Admitting: Interventional Radiology

## 2022-12-12 ENCOUNTER — Telehealth: Payer: Self-pay | Admitting: *Deleted

## 2022-12-12 ENCOUNTER — Encounter: Payer: Self-pay | Admitting: Medical

## 2022-12-12 ENCOUNTER — Other Ambulatory Visit: Payer: Self-pay

## 2022-12-12 ENCOUNTER — Ambulatory Visit: Payer: Medicare Other | Attending: Medical | Admitting: Medical

## 2022-12-12 ENCOUNTER — Encounter: Payer: Self-pay | Admitting: Radiology

## 2022-12-12 ENCOUNTER — Ambulatory Visit: Payer: Managed Care, Other (non HMO) | Admitting: Cardiology

## 2022-12-12 ENCOUNTER — Encounter: Payer: Self-pay | Admitting: Oncology

## 2022-12-12 VITALS — BP 122/64 | HR 60 | Ht 69.0 in | Wt 179.0 lb

## 2022-12-12 DIAGNOSIS — I5020 Unspecified systolic (congestive) heart failure: Secondary | ICD-10-CM | POA: Diagnosis present

## 2022-12-12 DIAGNOSIS — Z7982 Long term (current) use of aspirin: Secondary | ICD-10-CM | POA: Insufficient documentation

## 2022-12-12 DIAGNOSIS — N319 Neuromuscular dysfunction of bladder, unspecified: Secondary | ICD-10-CM | POA: Diagnosis not present

## 2022-12-12 DIAGNOSIS — E876 Hypokalemia: Secondary | ICD-10-CM | POA: Diagnosis present

## 2022-12-12 DIAGNOSIS — Z435 Encounter for attention to cystostomy: Secondary | ICD-10-CM | POA: Diagnosis present

## 2022-12-12 DIAGNOSIS — I10 Essential (primary) hypertension: Secondary | ICD-10-CM | POA: Diagnosis present

## 2022-12-12 DIAGNOSIS — Z87440 Personal history of urinary (tract) infections: Secondary | ICD-10-CM | POA: Diagnosis not present

## 2022-12-12 DIAGNOSIS — I493 Ventricular premature depolarization: Secondary | ICD-10-CM | POA: Insufficient documentation

## 2022-12-12 DIAGNOSIS — R339 Retention of urine, unspecified: Secondary | ICD-10-CM | POA: Insufficient documentation

## 2022-12-12 DIAGNOSIS — Z8546 Personal history of malignant neoplasm of prostate: Secondary | ICD-10-CM | POA: Insufficient documentation

## 2022-12-12 HISTORY — PX: IR CATHETER TUBE CHANGE: IMG717

## 2022-12-12 LAB — BASIC METABOLIC PANEL
BUN/Creatinine Ratio: 24 (ref 10–24)
BUN: 12 mg/dL (ref 8–27)
CO2: 28 mmol/L (ref 20–29)
Calcium: 9.7 mg/dL (ref 8.6–10.2)
Chloride: 99 mmol/L (ref 96–106)
Creatinine, Ser: 0.51 mg/dL — ABNORMAL LOW (ref 0.76–1.27)
Glucose: 103 mg/dL — ABNORMAL HIGH (ref 70–99)
Potassium: 4 mmol/L (ref 3.5–5.2)
Sodium: 141 mmol/L (ref 134–144)
eGFR: 113 mL/min/{1.73_m2} (ref >59)

## 2022-12-12 MED ORDER — MIDAZOLAM HCL 2 MG/2ML IJ SOLN
INTRAMUSCULAR | Status: AC | PRN
Start: 2022-12-12 — End: 2022-12-12
  Administered 2022-12-12: .5 mg via INTRAVENOUS

## 2022-12-12 MED ORDER — LIDOCAINE VISCOUS HCL 2 % MT SOLN
10.0000 mL | Freq: Once | OROMUCOSAL | Status: AC
  Administered 2022-12-12: 10 mL via TOPICAL

## 2022-12-12 MED ORDER — FENTANYL CITRATE (PF) 100 MCG/2ML IJ SOLN
INTRAMUSCULAR | Status: AC
  Filled 2022-12-12: qty 2

## 2022-12-12 MED ORDER — LIDOCAINE HCL 1 % IJ SOLN
INTRAMUSCULAR | Status: AC
  Filled 2022-12-12: qty 20

## 2022-12-12 MED ORDER — IOHEXOL 300 MG/ML  SOLN
10.0000 mL | Freq: Once | INTRAMUSCULAR | Status: AC | PRN
  Administered 2022-12-12: 10 mL

## 2022-12-12 MED ORDER — EMPAGLIFLOZIN 10 MG PO TABS: 10.0000 mg | ORAL_TABLET | Freq: Every day | ORAL | 3 refills | Status: DC

## 2022-12-12 MED ORDER — FENTANYL CITRATE (PF) 100 MCG/2ML IJ SOLN
INTRAMUSCULAR | Status: AC | PRN
Start: 2022-12-12 — End: 2022-12-12
  Administered 2022-12-12: 25 ug via INTRAVENOUS

## 2022-12-12 MED ORDER — SODIUM CHLORIDE 0.9 % IV SOLN: INTRAVENOUS | Status: DC | PRN

## 2022-12-12 MED ORDER — LIDOCAINE VISCOUS HCL 2 % MT SOLN
OROMUCOSAL | Status: AC
  Filled 2022-12-12: qty 15

## 2022-12-12 MED ORDER — MIDAZOLAM HCL 2 MG/2ML IJ SOLN
INTRAMUSCULAR | Status: AC
  Filled 2022-12-12: qty 2

## 2022-12-12 NOTE — Telephone Encounter (Signed)
Patient in today for appointment with provider. They requested assistance with filling out applications for assistance. We filled out assistance applications for [employer]. Applications faxed to companies and filed. No further needs.

## 2022-12-12 NOTE — Progress Notes (Signed)
Urine catheter clamped at 1020 per Victorino Dike, NP.

## 2022-12-12 NOTE — Patient Instructions (Signed)
Medication Instructions:  START Jardiance 10 mg once daily  *If you need a refill on your cardiac medications before your next appointment, please call your pharmacy*   Lab Work: Your provider would like for you to have following labs drawn today BMET  Your provider would like for you to return in 2 weeks to have the following labs drawn: BMET.   Please go to [facility] [address], [place] [address] You do not need an appointment.  They are open from 7:30 am-4 pm.  Lunch from 1:00 pm- 2:00 pm You will not need to be fasting.  If you have labs (blood work) drawn today and your tests are completely normal, you will receive your results only by: MyChart Message (if you have MyChart) OR A paper copy in the mail If you have any lab test that is abnormal or we need to change your treatment, we will call you to review the results.   Testing/Procedures: None ordered   Follow-Up: At [facility], you and your health needs are our priority.  As part of our continuing mission to provide you with exceptional heart care, we have created designated Provider Care Teams.  These Care Teams include your primary Cardiologist (physician) and Advanced Practice Providers (APPs -  Physician Assistants and Nurse Practitioners) who all work together to provide you with the care you need, when you need it.  We recommend signing up for the patient portal called "MyChart".  Sign up information is provided on this After Visit Summary.  MyChart is used to connect with patients for Virtual Visits (Telemedicine).  Patients are able to view lab/test results, encounter notes, upcoming appointments, etc.  Non-urgent messages can be sent to your provider as well.   To learn more about what you can do with MyChart, go to [email or web address].    Your next appointment:   2 month(s)  Provider:   You may see Yvonne Kendall, MD or one of the following  Advanced Practice Providers on your designated Care Team:   Cadence Lynndyl, [place]

## 2022-12-12 NOTE — Progress Notes (Signed)
Patient clinically stable post 16 FR to 16 FR Council SPT. Given Versed 0.5 mg along with Fentanyl 25 mcg IV for procedure per patient request/ awake/alert and oriented post procedure. Report given to Mar Daring RN /11 specials.

## 2022-12-23 ENCOUNTER — Other Ambulatory Visit: Payer: Self-pay

## 2022-12-23 ENCOUNTER — Encounter (HOSPITAL_COMMUNITY): Payer: Self-pay | Admitting: *Deleted

## 2022-12-23 NOTE — Progress Notes (Signed)
[name] denies chest pain, palpations  or shortness of breath. Patient denies having any s/s of Covid in his household, also denies any known exposure to Covid. [name] denies  any s/s of upper or lower respiratory infection in the past 8 weeks.   [name] PCP is Dr. Tillman Abide, cardiologist is Dr. Okey Dupre, EP cardiologist is Dr. Lalla Brothers. Patient wore a short time heart monitor 03/2020, no significant arrhthymias were noted, patient had complained of palpations at that time, denies palpations at this time.  Metoprolol was added.

## 2022-12-24 ENCOUNTER — Telehealth (HOSPITAL_COMMUNITY): Payer: Self-pay | Admitting: *Deleted

## 2022-12-24 NOTE — Telephone Encounter (Addendum)
Reaching out to patient to offer assistance regarding upcoming cardiac imaging study; pt verbalizes understanding of appt date/time, parking situation and where to check in, pre-test NPO status and medications ordered, and verified current allergies; name and call back number provided for further questions should they arise Johney Frame RN Navigator Cardiac Imaging [facility] [phone] office [phone] cell  Patient reports 2 prior hip replacements and left shoulder surgery with screws.

## 2022-12-24 NOTE — Telephone Encounter (Signed)
 Attempted to call patient regarding upcoming cardiac MRI appointment. Left message on voicemail with name and callback number Johney Frame RN Navigator Cardiac Imaging [facility] [phone] Office

## 2022-12-25 ENCOUNTER — Encounter (HOSPITAL_COMMUNITY): Payer: Self-pay | Admitting: *Deleted

## 2022-12-25 ENCOUNTER — Ambulatory Visit (HOSPITAL_COMMUNITY): Payer: Medicare Other

## 2022-12-25 ENCOUNTER — Ambulatory Visit (HOSPITAL_COMMUNITY)
Admission: RE | Admit: 2022-12-25 | Discharge: 2022-12-25 | Disposition: A | Discharge status: Home / Self Care | Payer: Medicare Other | Attending: Cardiology | Admitting: Cardiology

## 2022-12-25 ENCOUNTER — Ambulatory Visit (HOSPITAL_BASED_OUTPATIENT_CLINIC_OR_DEPARTMENT_OTHER)
Admission: RE | Admit: 2022-12-25 | Discharge: 2022-12-25 | Disposition: A | Discharge status: Home / Self Care | Payer: Medicare Other | Source: Ambulatory Visit | Attending: *Deleted | Admitting: *Deleted

## 2022-12-25 ENCOUNTER — Encounter (HOSPITAL_COMMUNITY)
Admission: RE | Disposition: A | Discharge status: Home / Self Care | Payer: Self-pay | Source: Home / Self Care | Attending: *Deleted

## 2022-12-25 ENCOUNTER — Ambulatory Visit (HOSPITAL_BASED_OUTPATIENT_CLINIC_OR_DEPARTMENT_OTHER): Payer: Medicare Other

## 2022-12-25 ENCOUNTER — Ambulatory Visit (HOSPITAL_COMMUNITY)
Admission: RE | Admit: 2022-12-25 | Discharge: 2022-12-25 | Disposition: A | Discharge status: Home / Self Care | Payer: Medicare Other | Source: Ambulatory Visit | Attending: Internal Medicine | Admitting: Internal Medicine

## 2022-12-25 DIAGNOSIS — I493 Ventricular premature depolarization: Secondary | ICD-10-CM

## 2022-12-25 DIAGNOSIS — D496 Neoplasm of unspecified behavior of brain: Secondary | ICD-10-CM

## 2022-12-25 DIAGNOSIS — Q2112 Patent foramen ovale: Secondary | ICD-10-CM | POA: Diagnosis not present

## 2022-12-25 DIAGNOSIS — I420 Dilated cardiomyopathy: Secondary | ICD-10-CM | POA: Diagnosis not present

## 2022-12-25 DIAGNOSIS — I77819 Aortic ectasia, unspecified site: Secondary | ICD-10-CM | POA: Diagnosis not present

## 2022-12-25 DIAGNOSIS — C61 Malignant neoplasm of prostate: Secondary | ICD-10-CM | POA: Insufficient documentation

## 2022-12-25 DIAGNOSIS — C7982 Secondary malignant neoplasm of genital organs: Secondary | ICD-10-CM

## 2022-12-25 DIAGNOSIS — C7940 Secondary malignant neoplasm of unspecified part of nervous system: Secondary | ICD-10-CM | POA: Insufficient documentation

## 2022-12-25 HISTORY — PX: RADIOLOGY WITH ANESTHESIA: SHX6223

## 2022-12-25 HISTORY — DX: Heart failure, unspecified: I50.9

## 2022-12-25 HISTORY — DX: Paralytic syndrome, unspecified: G83.9

## 2022-12-25 LAB — CBC
HCT: 41.4 % (ref 39.0–52.0)
Hemoglobin: 13.4 g/dL (ref 13.0–17.0)
MCH: 33.8 pg (ref 26.0–34.0)
MCHC: 32.4 g/dL (ref 30.0–36.0)
MCV: 104.3 fL — ABNORMAL HIGH (ref 80.0–100.0)
Platelets: 257 10*3/uL (ref 150–400)
RBC: 3.97 MIL/uL — ABNORMAL LOW (ref 4.22–5.81)
RDW: 13.5 % (ref 11.5–15.5)
WBC: 6.4 10*3/uL (ref 4.0–10.5)
nRBC: 0 % (ref 0.0–0.2)

## 2022-12-25 SURGERY — MRI WITH ANESTHESIA
Anesthesia: General

## 2022-12-25 MED ORDER — GADOBUTROL 1 MMOL/ML IV SOLN
9.0000 mL | Freq: Once | INTRAVENOUS | Status: AC | PRN
Start: 2022-12-25 — End: 2022-12-25
  Administered 2022-12-25: 9 mL via INTRAVENOUS

## 2022-12-25 MED ORDER — LIDOCAINE 2% (20 MG/ML) 5 ML SYRINGE
INTRAMUSCULAR | Status: DC | PRN
  Administered 2022-12-25: 100 mg via INTRAVENOUS

## 2022-12-25 MED ORDER — SODIUM CHLORIDE 0.9 % IV SOLN: INTRAVENOUS | Status: DC | PRN

## 2022-12-25 MED ORDER — CHLORHEXIDINE GLUCONATE 0.12 % MT SOLN
15.0000 mL | Freq: Once | OROMUCOSAL | Status: AC
Start: 2022-12-25 — End: 2022-12-25
  Administered 2022-12-25: 15 mL via OROMUCOSAL
  Filled 2022-12-25: qty 15

## 2022-12-25 MED ORDER — PROPOFOL 10 MG/ML IV BOLUS
INTRAVENOUS | Status: DC | PRN
  Administered 2022-12-25: 30 mg via INTRAVENOUS

## 2022-12-25 MED ORDER — DEXAMETHASONE SODIUM PHOSPHATE 10 MG/ML IJ SOLN
INTRAMUSCULAR | Status: DC | PRN
  Administered 2022-12-25: 5 mg via INTRAVENOUS

## 2022-12-25 MED ORDER — SUGAMMADEX SODIUM 200 MG/2ML IV SOLN
INTRAVENOUS | Status: DC | PRN
  Administered 2022-12-25: 200 mg via INTRAVENOUS

## 2022-12-25 MED ORDER — ALBUMIN HUMAN 5 % IV SOLN: INTRAVENOUS | Status: DC | PRN

## 2022-12-25 MED ORDER — ONDANSETRON HCL 4 MG/2ML IJ SOLN
INTRAMUSCULAR | Status: DC | PRN
  Administered 2022-12-25: 4 mg via INTRAVENOUS

## 2022-12-25 MED ORDER — PHENYLEPHRINE 80 MCG/ML (10ML) SYRINGE FOR IV PUSH (FOR BLOOD PRESSURE SUPPORT)
PREFILLED_SYRINGE | INTRAVENOUS | Status: DC | PRN
  Administered 2022-12-25 (×2): 80 ug via INTRAVENOUS

## 2022-12-25 MED ORDER — EPHEDRINE SULFATE-NACL 50-0.9 MG/10ML-% IV SOSY
PREFILLED_SYRINGE | INTRAVENOUS | Status: DC | PRN
  Administered 2022-12-25 (×2): 10 mg via INTRAVENOUS
  Administered 2022-12-25: 5 mg via INTRAVENOUS
  Administered 2022-12-25 (×2): 10 mg via INTRAVENOUS

## 2022-12-25 MED ORDER — ORAL CARE MOUTH RINSE: 15.0000 mL | Freq: Once | OROMUCOSAL | Status: AC

## 2022-12-25 MED ORDER — PHENYLEPHRINE HCL-NACL 20-0.9 MG/250ML-% IV SOLN
INTRAVENOUS | Status: DC | PRN
  Administered 2022-12-25: 30 ug/min via INTRAVENOUS

## 2022-12-25 MED ORDER — ROCURONIUM BROMIDE 10 MG/ML (PF) SYRINGE
PREFILLED_SYRINGE | INTRAVENOUS | Status: DC | PRN
  Administered 2022-12-25: 50 mg via INTRAVENOUS
  Administered 2022-12-25: 20 mg via INTRAVENOUS

## 2022-12-25 NOTE — Anesthesia Procedure Notes (Signed)
Procedure Name: Intubation Date/Time: 12/25/2022 11:09 AM  Performed by: Camillia Herter, CRNAPre-anesthesia Checklist: Patient identified, Emergency Drugs available, Suction available and Patient being monitored Patient Re-evaluated:Patient Re-evaluated prior to induction Oxygen Delivery Method: Circle System Utilized Preoxygenation: Pre-oxygenation with 100% oxygen Induction Type: IV induction Ventilation: Mask ventilation without difficulty Laryngoscope Size: Mac and 4 Grade View: Grade I Tube type: Oral Tube size: 7.5 mm Number of attempts: 1 Airway Equipment and Method: Stylet Placement Confirmation: ETT inserted through vocal cords under direct vision, positive ETCO2 and breath sounds checked- equal and bilateral Secured at: 23 cm Tube secured with: Tape Dental Injury: Teeth and Oropharynx as per pre-operative assessment

## 2022-12-25 NOTE — Anesthesia Postprocedure Evaluation (Signed)
Anesthesia Post Note  Patient: Jason [name]  Procedure(s) Performed: MRI WITH ANESTHESIA OF CERVICAL SPINE WITH AND WITHOUT CONTRAST,CARDIAC MORPHOLOGY     Patient location during evaluation: PACU Anesthesia Type: General Level of consciousness: awake and alert Pain management: pain level controlled Vital Signs Assessment: post-procedure vital signs reviewed and stable Respiratory status: spontaneous breathing, nonlabored ventilation, respiratory function stable and patient connected to nasal cannula oxygen Cardiovascular status: blood pressure returned to baseline and stable Postop Assessment: no apparent nausea or vomiting Anesthetic complications: no   No notable events documented.  Last Vitals:  Vitals:   12/25/22 1230 12/25/22 1245  BP: 114/60 (!) 121/56  Pulse: (!) 28 66  Resp: 14 11  Temp:  36.5 C  SpO2: 100% 97%    Last Pain:  Vitals:   12/25/22 1245  TempSrc:   PainSc: 0-No pain                 Earl Lites P Jarrett Albor

## 2022-12-25 NOTE — Transfer of Care (Addendum)
Immediate Anesthesia Transfer of Care Note  Patient: Jason [name]  Procedure(s) Performed: MRI WITH ANESTHESIA OF CERVICAL SPINE WITH AND WITHOUT CONTRAST,CARDIAC MORPHOLOGY  Patient Location: PACU  Anesthesia Type:General  Level of Consciousness: awake  Airway & Oxygen Therapy: Patient Spontanous Breathing  Post-op Assessment: Report given to RN and Post -op Vital signs reviewed and stable  Post vital signs: Reviewed  Last Vitals:  Vitals Value Taken Time  BP    Temp    Pulse    Resp    SpO2      Last Pain:  Vitals:   12/25/22 1245  TempSrc:   PainSc: 0-No pain      Patients Stated Pain Goal: 0 (12/25/22 0823)  Complications: No notable events documented.

## 2022-12-25 NOTE — Addendum Note (Signed)
Addendum  created 12/25/22 1401 by Camillia Herter, CRNA   Attestation recorded in Intraprocedure, Child order released for a procedure order, Clinical Note Signed, Flowsheet accepted, Intraprocedure Attestations filed, Scientist, water quality edited, Intraprocedure Event edited, Intraprocedure Flowsheets edited, Intraprocedure Meds edited, LDA created via procedure documentation, SmartForm saved

## 2022-12-25 NOTE — Anesthesia Preprocedure Evaluation (Signed)
Anesthesia Evaluation  Patient identified by MRN, date of birth, ID band Patient awake    Reviewed: Allergy & Precautions, NPO status , Patient's Chart, lab work & pertinent test results  History of Anesthesia Complications Negative for: history of anesthetic complications  Airway Mallampati: III  TM Distance: >3 FB Neck ROM: Full    Dental no notable dental hx. (+) Teeth Intact, Dental Advisory Given   Pulmonary neg pulmonary ROS   Pulmonary exam normal breath sounds clear to auscultation       Cardiovascular hypertension, Pt. on medications +CHF  Normal cardiovascular exam Rhythm:Regular Rate:Normal  Echo 06/2020   1. Left ventricular ejection fraction, by estimation, is 50 to 55%. The left ventricle has low normal function. The left ventricle has no regional wall motion abnormalities. There is mild left ventricular hypertrophy. Left ventricular diastolic parameters are consistent with Grade I diastolic dysfunction (impaired relaxation). The average left ventricular global longitudinal strain is -13.2 %. The global longitudinal strain is abnormal.   2. Right ventricular systolic function is low normal. The right ventricular size is normal.   3. The mitral valve is normal in structure. No evidence of mitral valve regurgitation.   4. The aortic valve is tricuspid. Aortic valve regurgitation is not visualized.    LHC 02/2020 1. No angiographically significant coronary artery disease. 2. Normal left ventricular contraction with mildly elevated filling pressures consistent with diastolic dysfunction.   Recommendations: 1. Primary prevention of coronary artery disease. 2. Continue blood pressure control as well as recently added metoprolol for management of incidentally noted PVCs.  If symptoms persist, ambulatory cardiac monitoring +/- echcardiography will need to be considered.      Neuro/Psych  PSYCHIATRIC DISORDERS      CVA     GI/Hepatic Neg liver ROS,GERD  Medicated and Controlled,,  Endo/Other  Hypothyroidism    Renal/GU negative Renal ROS     Musculoskeletal  (+) Arthritis , Osteoarthritis,    Abdominal Normal abdominal exam  (+)   Peds  Hematology  (+) Blood dyscrasia, anemia   Anesthesia Other Findings   Reproductive/Obstetrics                              Patient identified by MRN, date of birth, ID band Patient awake    Reviewed: Allergy & Precautions, NPO status , Patient's Chart, lab work & pertinent test results  Airway Mallampati: II  TM Distance: >3 FB Neck ROM: Full    Dental no notable dental hx.    Pulmonary neg pulmonary ROS,    Pulmonary exam normal        Cardiovascular hypertension, Pt. on medications and Pt. on home beta blockers  Rhythm:Regular Rate:Normal     Neuro/Psych Anxiety With claustrophobia, unable to remain still with prior imaging attemptsnegative neurological ROS     GI/Hepatic Neg liver ROS, GERD  ,  Endo/Other  Hypothyroidism   Renal/GU negative Renal ROS     Musculoskeletal  (+) Arthritis , Osteoarthritis,    Abdominal Normal abdominal exam  (+)   Peds  Hematology negative hematology ROS (+)   Anesthesia Other Findings   Reproductive/Obstetrics                           Anesthesia Physical Anesthesia Plan  ASA: 2  Anesthesia Plan: General   Post-op Pain Management:    Induction: Intravenous  PONV Risk Score and Plan: 2 and  Ondansetron, Dexamethasone, Midazolam and Treatment may vary due to age or medical condition  Airway Management Planned: Mask and LMA  Additional Equipment: None  Intra-op Plan:   Post-operative Plan: Extubation in OR  Informed Consent: I have reviewed the patients History and Physical, chart, labs and discussed the procedure including the risks, benefits and alternatives for the proposed anesthesia with the patient or authorized  representative who has indicated his/her understanding and acceptance.     Dental advisory given  Plan Discussed with: CRNA  Anesthesia Plan Comments: (PAT note by Antionette Poles, PA-C: Patient is undergone thorough cardiology evaluation for chest pain as well as palpitations, PSVT, NSVT last seen by Dr. In 01/24/2021.  Per note, "Symptoms have been quiescent with low-dose metoprolol.  Prior cath showed absence of CAD.  Echo was without significant structural abnormality.  Continue current dose of metoprolol.  No further work-up recommended at this time."  47-month follow-up recommended.  Patient will need day of surgery labs and evaluation.  EKG 03/11/2021: Normal sinus rhythm.  Rate 70. Nonspecific ST abnormality  TTE 07/19/2020:  1. Left ventricular ejection fraction, by estimation, is 50 to 55%. The  left ventricle has low normal function. The left ventricle has no regional  wall motion abnormalities. There is mild left ventricular hypertrophy.  Left ventricular diastolic  parameters are consistent with Grade I diastolic dysfunction (impaired  relaxation). The average left ventricular global longitudinal strain is  -13.2 %. The global longitudinal strain is abnormal.   2. Right ventricular systolic function is low normal. The right  ventricular size is normal.   3. The mitral valve is normal in structure. No evidence of mitral valve  regurgitation.   4. The aortic valve is tricuspid. Aortic valve regurgitation is not  visualized.   Event monitor 04/25/2020:  The patient was monitored for 14 days.  The predominant rhythm was sinus with an average rate of 85 bpm (range 47 - 122 bpm in sinus).  There were rare PAC's and occasional PVC's.  There were 6 atrial runs lasting up to 10.8 seconds with a maximum rate of 154 bpm.  There were 9 episodes of wide complex tachycardia lasting up to 8 beats with a maximum rate of 231 bpm. Episodes most likely represent NSVT, though some tracings could  also reflect SVT with aberrancy.  No sustained arrhythmia or prolonged pause was identified.  Patient triggered events correspond to sinus rhythm with PVC's.   Sinus rhythm with rare PAC's and occasional PVC's.  Brief runs of PSVT and NSVT noted, as detailed above.  Cath 03/20/2020: Conclusions: 1. No angiographically significant coronary artery disease. 2. Normal left ventricular contraction with mildly elevated filling pressures consistent with diastolic dysfunction.   Recommendations: 1. Primary prevention of coronary artery disease. 2. Continue blood pressure control as well as recently added metoprolol for management of incidentally noted PVCs.  If symptoms persist, ambulatory cardiac monitoring +/- echcardiography will need to be considered. )      Anesthesia Quick Evaluation  Anesthesia Physical Anesthesia Plan  ASA: 3  Anesthesia Plan: General   Post-op Pain Management: Minimal or no pain anticipated   Induction: Intravenous  PONV Risk Score and Plan: 2 and Dexamethasone, Treatment may vary due to age or medical condition, Ondansetron and Midazolam  Airway Management Planned: Oral ETT  Additional Equipment: None  Intra-op Plan:   Post-operative Plan: Extubation in OR  Informed Consent: I have reviewed the patients History and Physical, chart, labs and discussed the procedure including the risks,  benefits and alternatives for the proposed anesthesia with the patient or authorized representative who has indicated his/her understanding and acceptance.     Dental advisory given  Plan Discussed with: CRNA  Anesthesia Plan Comments:          Anesthesia Quick Evaluation

## 2022-12-26 ENCOUNTER — Telehealth: Payer: Self-pay | Admitting: Internal Medicine

## 2022-12-26 ENCOUNTER — Encounter (HOSPITAL_COMMUNITY): Payer: Self-pay | Admitting: Radiology

## 2022-12-26 NOTE — Telephone Encounter (Signed)
Tiffany from [facility] called stating that the notes from patients visit with Letvak on 11/19/2022 was received by them ,however they look incomplete.She said that it looks like Dr Alphonsus Sias started the note,but did not finish them.She would like to know if they could the completed note be refaxed to her.   Fax [phone]

## 2022-12-26 NOTE — Telephone Encounter (Signed)
Spoke with Tiffany and notes have been refaxed.

## 2022-12-29 ENCOUNTER — Telehealth: Payer: Self-pay | Admitting: Internal Medicine

## 2022-12-29 NOTE — Telephone Encounter (Signed)
Physical therapist reports that while working with patient he noted when standing HR dropped into the low 50's with decreased BP with dizziness as well. He wanted to know if he should decrease his metoprolol. Inquired if his heart rate was regular or with PVC's. He states that it was in fact irregular and slow. Advised that I would route to provider for review and further recommendations then I will give him a call back. He was appreciative with no further questions at this time.

## 2022-12-29 NOTE — Telephone Encounter (Signed)
Pt c/o medication issue:  1. Name of Medication: metoprolol succinate (TOPROL-XL) 50 MG 24 hr tablet (Expired)   2. How are you currently taking this medication (dosage and times per day)?  Take 1 tablet (50 mg total) by mouth at bedtime. Take with or immediately following a meal.       3. Are you having a reaction (difficulty breathing--STAT)? No  4. What is your medication issue? Thayer Ohm the physical therapist is requesting a callback regarding him wanting to know if pt should consider cutting this medication in half. Please advise

## 2022-12-29 NOTE — Telephone Encounter (Signed)
Spoke with PT and reviewed provider recommendations. He verbalized understanding and will discuss with patient as he may want to stay on same dose for now. He verbalized understanding of recommendations with no further questions at this time.     Furth, Cadence H, PA-C  Bryna Colander, RN Caller: Unspecified (Today, 10:15 AM) It's hard to get an accurate heart rate unless it's an EKG due to the PVCs. At home watches and home devices may show falsely low heart rate. Dizziness may be coming from low pressures, since this has been an issue in the past. If symptoms are severe, we can decrease Toprol to 37.5mg  daily. Or we can wait until follow-up and discuss at that time.

## 2023-01-02 ENCOUNTER — Ambulatory Visit: Payer: Self-pay | Admitting: Internal Medicine

## 2023-01-08 ENCOUNTER — Telehealth: Payer: Self-pay | Admitting: Medical

## 2023-01-08 ENCOUNTER — Ambulatory Visit: Payer: Managed Care, Other (non HMO) | Admitting: Internal Medicine

## 2023-01-08 NOTE — Telephone Encounter (Signed)
Called patient, spoke with wife. Advised that per last OV with Cadence, PA- they started Jardiance, BMET completed the day of the visit, then recommended to recheck in 2 weeks. However patient states they have no heard from patient assistance and he has not started the medication. Patient would like to wait to recheck blood work when he is able to start the medication.   Pam, can we check on the patient assistance for Jardiance? They stated they sent everything over but have not heard anything.   Thanks!

## 2023-01-08 NOTE — Telephone Encounter (Signed)
Application was submitted and still pending review by company.    12/12/22  2:56 PM Note Patient in today for appointment with provider. They requested assistance with filling out applications for assistance. We filled out assistance applications for [employer]. Applications faxed to companies and filed. No further needs.

## 2023-01-08 NOTE — Telephone Encounter (Signed)
Pt spouse called in asking if pt needs labs done again

## 2023-01-09 ENCOUNTER — Inpatient Hospital Stay: Payer: Medicare Other | Attending: Anatomic Pathology & Clinical Pathology | Admitting: Internal Medicine

## 2023-01-09 ENCOUNTER — Telehealth: Payer: Self-pay

## 2023-01-09 VITALS — BP 125/78 | Temp 95.0°F | Resp 18

## 2023-01-09 DIAGNOSIS — Z993 Dependence on wheelchair: Secondary | ICD-10-CM | POA: Diagnosis not present

## 2023-01-09 DIAGNOSIS — K219 Gastro-esophageal reflux disease without esophagitis: Secondary | ICD-10-CM | POA: Insufficient documentation

## 2023-01-09 DIAGNOSIS — M47816 Spondylosis without myelopathy or radiculopathy, lumbar region: Secondary | ICD-10-CM | POA: Insufficient documentation

## 2023-01-09 DIAGNOSIS — Z8673 Personal history of transient ischemic attack (TIA), and cerebral infarction without residual deficits: Secondary | ICD-10-CM | POA: Diagnosis not present

## 2023-01-09 DIAGNOSIS — Z803 Family history of malignant neoplasm of breast: Secondary | ICD-10-CM | POA: Insufficient documentation

## 2023-01-09 DIAGNOSIS — K5730 Diverticulosis of large intestine without perforation or abscess without bleeding: Secondary | ICD-10-CM | POA: Insufficient documentation

## 2023-01-09 DIAGNOSIS — R200 Anesthesia of skin: Secondary | ICD-10-CM | POA: Diagnosis not present

## 2023-01-09 DIAGNOSIS — Z87442 Personal history of urinary calculi: Secondary | ICD-10-CM | POA: Diagnosis not present

## 2023-01-09 DIAGNOSIS — M47812 Spondylosis without myelopathy or radiculopathy, cervical region: Secondary | ICD-10-CM | POA: Diagnosis not present

## 2023-01-09 DIAGNOSIS — Z8249 Family history of ischemic heart disease and other diseases of the circulatory system: Secondary | ICD-10-CM | POA: Insufficient documentation

## 2023-01-09 DIAGNOSIS — C61 Malignant neoplasm of prostate: Secondary | ICD-10-CM | POA: Diagnosis not present

## 2023-01-09 DIAGNOSIS — Z9089 Acquired absence of other organs: Secondary | ICD-10-CM | POA: Insufficient documentation

## 2023-01-09 DIAGNOSIS — Z923 Personal history of irradiation: Secondary | ICD-10-CM | POA: Insufficient documentation

## 2023-01-09 DIAGNOSIS — M50321 Other cervical disc degeneration at C4-C5 level: Secondary | ICD-10-CM | POA: Diagnosis not present

## 2023-01-09 DIAGNOSIS — M4802 Spinal stenosis, cervical region: Secondary | ICD-10-CM | POA: Diagnosis not present

## 2023-01-09 DIAGNOSIS — G8220 Paraplegia, unspecified: Secondary | ICD-10-CM | POA: Diagnosis not present

## 2023-01-09 DIAGNOSIS — Q2112 Patent foramen ovale: Secondary | ICD-10-CM | POA: Insufficient documentation

## 2023-01-09 DIAGNOSIS — E039 Hypothyroidism, unspecified: Secondary | ICD-10-CM | POA: Diagnosis not present

## 2023-01-09 DIAGNOSIS — I340 Nonrheumatic mitral (valve) insufficiency: Secondary | ICD-10-CM | POA: Insufficient documentation

## 2023-01-09 DIAGNOSIS — C7951 Secondary malignant neoplasm of bone: Secondary | ICD-10-CM | POA: Diagnosis present

## 2023-01-09 DIAGNOSIS — Z823 Family history of stroke: Secondary | ICD-10-CM | POA: Insufficient documentation

## 2023-01-09 DIAGNOSIS — Z814 Family history of other substance abuse and dependence: Secondary | ICD-10-CM | POA: Insufficient documentation

## 2023-01-09 DIAGNOSIS — I110 Hypertensive heart disease with heart failure: Secondary | ICD-10-CM | POA: Insufficient documentation

## 2023-01-09 DIAGNOSIS — I509 Heart failure, unspecified: Secondary | ICD-10-CM | POA: Diagnosis not present

## 2023-01-09 DIAGNOSIS — M48061 Spinal stenosis, lumbar region without neurogenic claudication: Secondary | ICD-10-CM | POA: Diagnosis not present

## 2023-01-09 DIAGNOSIS — Z79899 Other long term (current) drug therapy: Secondary | ICD-10-CM | POA: Diagnosis not present

## 2023-01-09 DIAGNOSIS — Z79890 Hormone replacement therapy (postmenopausal): Secondary | ICD-10-CM | POA: Insufficient documentation

## 2023-01-09 DIAGNOSIS — C7940 Secondary malignant neoplasm of unspecified part of nervous system: Secondary | ICD-10-CM | POA: Insufficient documentation

## 2023-01-09 DIAGNOSIS — I700 Atherosclerosis of aorta: Secondary | ICD-10-CM | POA: Insufficient documentation

## 2023-01-09 DIAGNOSIS — Z8349 Family history of other endocrine, nutritional and metabolic diseases: Secondary | ICD-10-CM | POA: Insufficient documentation

## 2023-01-09 DIAGNOSIS — Z801 Family history of malignant neoplasm of trachea, bronchus and lung: Secondary | ICD-10-CM | POA: Insufficient documentation

## 2023-01-09 NOTE — Progress Notes (Signed)
[facility] [address]  [place], [place] [address] [phone]   Interval Evaluation  Date of Service: 01/09/23 Patient Name: Jason [name] Patient MRN: [id number] Patient DOB: 1955-07-18 Provider: Henreitta Leber, MD  Identifying Statement:  Jason [name] is a 67 y.o. male with No diagnosis found.   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  Associated epidural disease greater at the C6 where there is marked canal stenosis without cord compression.  Left greater than right foraminal effacement due to the extraosseous disease.   10/03/2021 Imaging   MRI left shoulder showed High-riding humeral head with massive full-thickness tear of the entire supraspinatus tendon and the anterior 50% of the infraspinatus tendon. Additional partial-thickness articular sided tearing of the posterior infraspinatus. Moderate to high-grade supraspinatus and infraspinatus muscle atrophy suggests these tears  are chronic. Partial-thickness tearing of the superior greater than inferior  aspects of the subscapularis tendon footprint. Mild subscapularis muscle atrophy.  Moderate degenerative changes of the acromioclavicular joint. Moderate glenohumeral cartilage degenerative changes.   10/05/2021 Imaging   CT chest abdomen pelvis with contrast showed  Moderate severity retroperitoneal and pelvic lymphadenopathy,consistent with metastatic disease. Small lytic areas at the levels of L4 and S1, with diffusely sclerotic changes involving the second and fourth right ribs, sacrum and left iliac bone. 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  Sigmoid diverticulosis. Aortic atherosclerosis   10/05/2021 Imaging   CT cervical spine without contrast Extensive heterogeneous sclerotic lesions throughout the cervical spine compatible with metastatic disease to the bone.  Mixed sclerotic and lytic changes in the posterior elements at C5 and C6.Extraosseous tumor at C5 and C6 is better appreciated on the MRI scan.   10/05/2021 Tumor Marker   PSA >1500   10/06/2021 Cancer Staging   Staging form: Prostate, AJCC 8th Edition - Clinical stage from 10/06/2021: Stage IVB (cN1, pM1c, PSA: 1500) - Signed by [name], MD on 12/04/2021 Stage prefix: Initial diagnosis Prostate specific antigen (PSA) range: 20 or greater   10/08/2021 Imaging   MRI thoracic spine and the lumbar spine with and without contrast 1. 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 2. 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 3. Additional epidural tumor along the posterior endplates at T9,T11, and T12 without high-grade spinal canal stenosis or cord compression. 4. Extensive epidural tumor in the lumbar spine is greatest in thickness at L1 without high-grade spinal canal stenosis at this level; however, superimposed on pre-existing degenerative changes lower in the lumbar spine results in severe spinal canal stenosis with cauda equina nerve root compression at L3-L4 and L4-L5. Multilevel neural foraminal stenosis is detailed above. 5. Postsurgical changes reflecting C6 laminectomy unchanged spinal canal stenosis compared to the postoperative study from 1 day prior.No definite cord signal abnormality. 6. Extramedullary fluid collection along the dorsal aspect of the cord beginning at  T9-T10 extending into the lumbar spine is likely subdural in location but is of uncertain etiology; evolving blood products not excluded. The  collection anteriorly displaces the cord and cauda equina nerve roots without frank cord compression or signal abnormality. There is no peripheral enhancement to suggest abscess. 7. Prevertebral edema in the cervical spine which may be postsurgical in nature. 8. New bilateral lower lobe consolidations may reflect atelectasis or developing pneumonia/aspiration.   10/08/2021 Imaging   MRI brain with and without contrast showed 1. 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 There is intracranial extension of tumor along the right frontal convexity measuring up to 5 mm in thickness without mass effect on the underlying brain parenchyma or midline shift. Extracranial extension of tumor in this location measures up to approximately 3 mm. 2. Additional extracranial extension of tumor into the scalp along the vertex measuring up to 7 mm in thickness. 3. Small focus of diffusion restriction in the right parietal cortex most in keeping with a small acute infarct. 4. No evidence of parenchymal metastatic disease.   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 10/06/2021, patient underwent C6 cervical laminectomy and resection of tumor.  Pathology came back positive for metastatic prostate cancer. 8/14 developed significant bilateral upper and lower extremity weakness with hypotension requiring transfer to the ICU, pressors, steroid, respiratory failure on mechanical ventilation. Later patient was stabilized and extubated.  8/16 Firmagon loading dose 240mg   9/15 Eligard 22.5mg  given inpatient during his rehab admission.     10/2021 -  Radiation Therapy   Palliative radiation to spine.    12/04/2021 Tumor Marker    PSA 67.09   01/13/2022 Tumor Marker   PSA 43.95   01/23/2022 -  Chemotherapy   Started on Xtandi 160mg  daily.     CNS Oncologic History 10/06/21: Laminectomy, resection of epidural tumor ~C6 with Dr. Marcell Barlow.   11/13/21: Post-op radiation, 30/10 from C4 to T2 with Dr. Kathrynn Running  Interval History: Jason [name] presents today for follow up after recent cervical spine MRI study.  He reports gradual continued improved strength in both legs, left arm.  Now has a suprapubic catheter.  He remains in the motorized wheelchair, is working aggressively with physical and occupational therapy.  Continues on prostate cancer therapy with Dr. Cathie Hoops.  No new or progressive symptoms otherwise.  H+P (12/20/21) Patient presented to neurologic attention in August with new onset numbness in left arm and hand.  This had been proceeded by several months of pain in back of his neck.  Spine imaging demonstrated diffuse metastatic burden and tumor infiltrating spinal column in cervical region.  He underwent laminectomy and debulking of epidural tumor with Dr. Marcell Barlow on 10/06/21 at [place].  This was then followed by post-operative radiation therapy with Dr. Kathrynn Running in [place], also to cervical and upper thoracic region, complete 9/20 after 10 sessions.  No surgery or radiation has been focused on lower spine to this point.  At present, he is wheelchair bound due to bilateral leg weakness, which is improving gradually through aggressive PT.  His arms and hands are functional aside from more chronic weakness in his left shoulder.  He is incontinent of urine and has indwelling foley, but he does have bladder and saddle sensation intact.     Medications: Current Outpatient Medications on File Prior to Visit  Medication Sig Dispense Refill   acetaminophen (TYLENOL) 500 MG tablet Take 1,000 mg by mouth 2 (two) times daily.     amoxicillin (AMOXIL) 500 MG capsule Take  2,000 mg by mouth See admin instructions. Take 2000  mg 1 hour prior to dental work     ascorbic acid (VITAMIN C) 500 MG tablet Take 1 tablet (500 mg total) by mouth daily. 30 tablet 0   aspirin EC 81 MG tablet Take 1 tablet (81 mg total) by mouth daily. Swallow whole. (Patient taking differently: Take 162 mg by mouth daily. Swallow whole.) 30 tablet 12   atorvastatin (LIPITOR) 40 MG tablet Take 1 tablet (40 mg total) by mouth daily. 90 tablet 3   cetirizine (ZYRTEC) 10 MG tablet Take 10 mg by mouth at bedtime.     clonazePAM (KLONOPIN) 0.5 MG tablet Take 1 tablet (0.5 mg total) by mouth 2 (two) times daily as needed for anxiety. (Patient taking differently: Take 0.5 mg by mouth 2 (two) times daily.) 180 tablet 0   CRANBERRY PO Take 1 tablet by mouth 2 (two) times daily.     darolutamide (NUBEQA) 300 MG tablet Take 600 mg by mouth 2 (two) times daily with a meal.     empagliflozin (JARDIANCE) 10 MG TABS tablet Take 1 tablet (10 mg total) by mouth daily before breakfast. 90 tablet 3   famotidine (PEPCID) 20 MG tablet Take 1 tablet (20 mg total) by mouth daily. (Patient taking differently: Take 20 mg by mouth at bedtime.) 30 tablet 0   FLUoxetine (PROZAC) 20 MG capsule Take 1 capsule (20 mg total) by mouth at bedtime. 90 capsule 3   fluticasone (FLONASE) 50 MCG/ACT nasal spray Place 1 spray into both nostrils daily. 48 g 3   guaiFENesin (MUCINEX) 600 MG 12 hr tablet Take 600 mg by mouth 2 (two) times daily.     levothyroxine (SYNTHROID) 88 MCG tablet Take 1 tablet (88 mcg total) by mouth daily. 90 tablet 3   metoprolol succinate (TOPROL-XL) 50 MG 24 hr tablet Take 1 tablet (50 mg total) by mouth at bedtime. Take with or immediately following a meal. 90 tablet 3   Multiple Vitamin (MULTIVITAMIN) tablet Take 1 tablet by mouth daily.     potassium chloride (KLOR-CON M) 20 MEQ tablet Take 1 tablet (20 mEq total) by mouth at bedtime. 90 tablet 3   sacubitril-valsartan (ENTRESTO) 24-26 MG Take 1 tablet by mouth 2 (two) times daily. 28 tablet 0    sennosides-docusate sodium (SENOKOT-S) 8.6-50 MG tablet Take 2 tablets by mouth daily. 60 tablet 11   tamsulosin (FLOMAX) 0.4 MG CAPS capsule Take 1 capsule (0.4 mg total) by mouth daily after supper. (Patient taking differently: Take 0.4 mg by mouth daily.) 90 capsule 3   XTANDI 40 MG tablet TAKE 4 TABLETS BY MOUTH DAILY 120 tablet 2   No current facility-administered medications on file prior to visit.    Allergies: No Known Allergies Past Medical History:  Past Medical History:  Diagnosis Date   Allergy    Arthritis    osteoarthritis   Cancer (HCC)    prostate   CHF (congestive heart failure) (HCC)    GERD (gastroesophageal reflux disease)    History of kidney stones    passed   Hypertension    Hypothyroidism    Palpitations 2022   Paralysis (HCC)    paraplegia 09/2021-surgery or stroke   Pneumonia    12/22/28- > 25 years ago   Stroke [facility])    Mini stroke - Aug 2023   Thyroid disease    Hyperthyroidism s/p radioactive iodine ablation   Past Surgical History:  Past Surgical History:  Procedure Laterality Date  CARDIAC CATHETERIZATION  03/2000   HERNIA REPAIR  1961   umbilical   IR CATHETER TUBE CHANGE  12/12/2022   IR US GUIDE BX ASP/DRAIN  11/11/2022   LEFT HEART CATH AND CORONARY ANGIOGRAPHY Left 03/20/2020   Procedure: LEFT HEART CATH AND CORONARY ANGIOGRAPHY;  Surgeon: Yvonne Kendall, MD;  Location: [facility] INVASIVE CV LAB;  Service: Cardiovascular;  Laterality: Left;   POSTERIOR CERVICAL FUSION/FORAMINOTOMY N/A 10/06/2021   Procedure: POSTERIOR CERVICAL FUSION/ FORAMINOTOMY LEVEL 3;  Surgeon: Venetia Night, MD;  Location: [facility] ORS;  Service: Neurosurgery;  Laterality: N/A;  will need monitoring   RADIOLOGY WITH ANESTHESIA Left 04/18/2021   Procedure: MRI SHOULDER WITHOUT CONTRAST WITH ANESTHESIA;  Surgeon: Radiologist, Medication, MD;  Location: [facility] OR;  Service: Radiology;  Laterality: Left;   RADIOLOGY WITH ANESTHESIA N/A 10/03/2021   Procedure: MRI CERVICAL  SPINE WITH ANESTHESIA;  Surgeon: Radiologist, Medication, MD;  Location: [facility] OR;  Service: Radiology;  Laterality: N/A;   RADIOLOGY WITH ANESTHESIA N/A 01/30/2022   Procedure: MRI CERVICAL SPINE WITH AND WITOHOUT CONTRAST WITH ANESTHESIA;  Surgeon: Radiologist, Medication, MD;  Location: [facility] OR;  Service: Radiology;  Laterality: N/A;   RADIOLOGY WITH ANESTHESIA N/A 05/22/2022   Procedure: MRI LUMBER SPINE AND CERVIC SPINE WITH AND WITHOUT CONTRAST;  Surgeon: Radiologist, Medication, MD;  Location: [facility] OR;  Service: Radiology;  Laterality: N/A;   RADIOLOGY WITH ANESTHESIA N/A 12/25/2022   Procedure: MRI WITH ANESTHESIA OF CERVICAL SPINE WITH AND WITHOUT CONTRAST,CARDIAC MORPHOLOGY;  Surgeon: Radiologist, Medication, MD;  Location: [facility] OR;  Service: Radiology;  Laterality: N/A;   REVERSE SHOULDER ARTHROPLASTY Left    ROTATOR CUFF REPAIR  11/2010   Dr Hyacinth Meeker   SUPRAPUBIC CATHETER INSERTION  11/11/2022   TONSILLECTOMY     TOTAL HIP ARTHROPLASTY Right 02/24/2009   TOTAL HIP ARTHROPLASTY Left 06/2016   Dr Odis Luster   Social History:  Social History   Socioeconomic History   Marital status: Married    Spouse name: [name]    Number of children: 0   Years of education: Not on file   Highest education level: Bachelor's degree (e.g., BA, AB, BS)  Occupational History   Occupation: Warden/ranger: [employer]  Tobacco Use   Smoking status: Never    Passive exposure: Never   Smokeless tobacco: Never  Vaping Use   Vaping status: Never Used  Substance and Sexual Activity   Alcohol use: Not Currently   Drug use: No   Sexual activity: Not Currently  Other Topics Concern   Not on file  Social History Narrative   Lives at home with spouse.    Social Determinants of Health   Financial Resource Strain: Low Risk  (06/30/2022)   Overall Financial Resource Strain (CARDIA)    Difficulty of Paying Living Expenses: Not hard at all  Food Insecurity: No Food Insecurity (06/30/2022)   Hunger  Vital Sign    Worried About Running Out of Food in the Last Year: Never true    Ran Out of Food in the Last Year: Never true  Transportation Needs: No Transportation Needs (06/30/2022)   PRAPARE - Administrator, Civil Service (Medical): No    Lack of Transportation (Non-Medical): No  Physical Activity: Unknown (06/30/2022)   Exercise Vital Sign    Days of Exercise per Week: 0 days    Minutes of Exercise per Session: Not on file  Stress: No Stress Concern Present (06/30/2022)   [employer] of Occupational Health - Occupational Stress Questionnaire  Feeling of Stress : Only a little  Social Connections: Moderately Integrated (06/30/2022)   Social Connection and Isolation Panel [NHANES]    Frequency of Communication with Friends and Family: Three times a week    Frequency of Social Gatherings with Friends and Family: More than three times a week    Attends Religious Services: Never    Database administrator or Organizations: Yes    Attends Engineer, structural: 1 to 4 times per year    Marital Status: Married  Catering manager Violence: Not on file   Family History:  Family History  Problem Relation Age of Onset   Hypertension Mother    Stroke Mother    Gout Father    Heart attack Father 37   Heart disease Sister    Lung cancer Sister    Drug abuse Sister    Breast cancer Paternal Aunt    Lung cancer Maternal Grandmother    Colon cancer Neg Hx    Esophageal cancer Neg Hx    Rectal cancer Neg Hx    Stomach cancer Neg Hx     Review of Systems: Constitutional: Doesn't report fevers, chills or abnormal weight loss Eyes: Doesn't report blurriness of vision Ears, nose, mouth, throat, and face: Doesn't report sore throat Respiratory: Doesn't report cough, dyspnea or wheezes Cardiovascular: Doesn't report palpitation, chest discomfort  Gastrointestinal:  Doesn't report nausea, constipation, diarrhea GU: Doesn't report incontinence Skin: Doesn't report skin  rashes Neurological: Per HPI Musculoskeletal: Doesn't report joint pain Behavioral/Psych: Doesn't report anxiety  Physical Exam: Vitals:   01/09/23 1128 01/09/23 1131  BP:  125/78  Resp: 18   Temp: (!) 95 F (35 C)     KPS: 50. General: Alert, cooperative, pleasant, in no acute distress Head: Normal EENT: No conjunctival injection or scleral icterus.  Lungs: Resp effort normal Cardiac: Regular rate Abdomen: indwelling foley Skin: No rashes cyanosis or petechiae. Extremities: No clubbing or edema  Neurologic Exam: Mental Status: Awake, alert, attentive to examiner. Oriented to self and environment. Language is fluent with intact comprehension.  Cranial Nerves: Visual acuity is grossly normal. Visual fields are full. Extra-ocular movements intact. No ptosis. Face is symmetric Motor: Spastic tone.  Left arm 3/5 at deltoid, 5/5 elsewhere.  Some impairment in fine motor function.  Legs 3/5 bilaterally, spastic and hyperreflexic. Sensory: Intact to light touch Gait: Non ambulatory   Labs: I have reviewed the data as listed    Component Value Date/Time   NA 141 12/12/2022 1432   K 4.0 12/12/2022 1432   CL 99 12/12/2022 1432   CO2 28 12/12/2022 1432   GLUCOSE 103 (H) 12/12/2022 1432   GLUCOSE 91 11/20/2022 1156   BUN 12 12/12/2022 1432   CREATININE 0.51 (L) 12/12/2022 1432   CREATININE 0.42 (L) 10/03/2022 1002   CALCIUM 9.7 12/12/2022 1432   PROT 6.3 (L) 10/03/2022 1002   PROT 7.2 01/29/2021 0822   ALBUMIN 3.7 10/03/2022 1002   ALBUMIN 3.8 (L) 09/19/2022 1458   AST 20 10/03/2022 1002   ALT 19 10/03/2022 1002   ALKPHOS 73 10/03/2022 1002   BILITOT 0.8 10/03/2022 1002   GFRNONAA >60 11/20/2022 1156   GFRNONAA >60 10/03/2022 1002   GFRAA 106 03/14/2020 1254   Lab Results  Component Value Date   WBC 6.4 12/25/2022   NEUTROABS 6.1 10/03/2022   HGB 13.4 12/25/2022   HCT 41.4 12/25/2022   MCV 104.3 (H) 12/25/2022   PLT 257 12/25/2022    Imaging:  MR  CERVICAL  SPINE W WO CONTRAST  Result Date: 12/25/2022 CLINICAL DATA:  Brain/CNS neoplasm, assess treatment response EXAM: MRI CERVICAL SPINE WITHOUT AND WITH CONTRAST TECHNIQUE: Multiplanar and multiecho pulse sequences of the cervical spine, to include the craniocervical junction and cervicothoracic junction, were obtained without and with intravenous contrast. CONTRAST:  9mL GADAVIST GADOBUTROL 1 MMOL/ML IV SOLN COMPARISON:  Cervical spine MRI 05/22/22 FINDINGS: Alignment: Straightening of the normal cervical lordosis. Vertebrae: Redemonstrated diffuse osseous metastatic disease involving the lower cervical spine and visualized thoracic spine, not significantly changed from prior exam. No evidence of a pathologic compression deformity Redemonstrated are postsurgical changes from a prior laminectomy at C6 with interval decrease in heterogeneous contrast enhancement at the laminectomy site. Compared to prior exam there is persistent smooth contrast enhancement at the dorsal epidural space of the C4-C5 level. There is also interval decrease in the size of the seroma at the laminectomy site which measures up to 5 x 4 mm, previously 7 x 7 mm. Cord: There is interval decrease in previously seen T2 hyperintense cord signal abnormality of the C4-C6 levels, favored to represent evolving posttreatment changes Posterior Fossa, vertebral arteries, paraspinal tissues: Layering secretions in the oral and hypopharynx. Disc levels: Mild spinal canal narrowing C4-C5 secondary to a combination of a disc bulge and ligamentum flavum hypertrophy. Moderate neural foraminal narrowing at C3-C4 (left) and C4-C5 (right) secondary to a combination of uncovertebral hypertrophy and facet degenerative change. Findings have progressed from prior exam. IMPRESSION: 1. Redemonstrated osseous metastatic disease involving the lower cervical spine and visualized thoracic spine, not significantly changed from prior exam. No evidence of a pathologic  compression deformity. 2. Interval decrease in the size of seroma and heterogeneous contrast enhancement at the laminectomy site. 3. Persistent smooth contrast enhancement at the dorsal epidural space of the C4-C5 level. Recommend continued attention on follow up. 4. Interval decrease in previously seen T2 hyperintense cord signal abnormality of the C4-C6 levels, favored to represent evolving posttreatment changes Electronically Signed   By: Lorenza Cambridge M.D.   On: 12/25/2022 14:05   MR CARDIAC MORPHOLOGY W WO CONTRAST  Result Date: 12/25/2022 CLINICAL DATA:  Clinical question of PVCs Study assumes HCT of 41 and BSA of 1.99 m2. EXAM: CARDIAC MRI TECHNIQUE: The patient was scanned on a 1.5 Tesla GE magnet. A dedicated cardiac coil was used. Functional imaging was done using Fiesta sequences. 2,3, and 4 chamber views were done to assess for RWMA's. Modified Simpson's rule using a short axis stack was used to calculate an ejection fraction on a dedicated work Research officer, trade union. The patient received 9 cc of Gadavist. After 10 minutes inversion recovery sequences were used to assess for infiltration and scar tissue. Flow quantification was performed 2 times during this examination with flow quantification performed at the levels of the ascending aorta above the valve, pulmonary artery above the valve. CONTRAST:  9 cc  of Gadavist FINDINGS: 1. Normal left ventricular size, with LVEDD 57 mm, and LVEDVi 90 mL/m2. Normal left ventricular thickness. Mild decrease in left ventricular systolic function (LVEF =43%). Global hypokinesis. Left ventricular parametric mapping notable for normal T2. Mild increase in ECV in the basal inferior (38%). There is no late gadolinium enhancement in the left ventricular myocardium. 2. Normal right ventricular size with RVEDVI 83 mL/m2. Normal right ventricular thickness. Normal right ventricular systolic function (RVEF =52%). There are no regional wall motion abnormalities or  aneurysms. Qualitatively, RV T2 signal is increased in the RV free wall. There is no  late gadolinium enhancement in the right ventricular myocardium. 3.  Mild biatrial dilation.  PFO noted. 4.  Normal size of the aortic root and pulmonary artery. Mild aortic dilation 40 mm. 5. Valve assessment: Aortic Valve: Tri-leaflet aortic valve. There is mild central regurgitation. Regurgitant fraction 14%. Pulmonic Valve: No significant regurgitation. Tricuspid Valve: There is moderate regurgitation. Regurgitant fraction 28%. Mitral Valve: There is mild to moderate regurgitation. Regurgitant fraction 16%. 6.  Normal pericardium.  No pericardial effusion. 7. Grossly, no extracardiac findings. Recommended dedicated study if concerned for non-cardiac pathology. IMPRESSION: 1.  Mild decrease in LVEF, 43%. 2. Mild increase in ECV signal no evidence of LGE suggestive of high risk scar. 3.  PFO noted. 4. Mild aortic dilation, 40 mm. Consider repeat cardiac imaging in one year. Riley Lam MD Electronically Signed   By: Riley Lam M.D.   On: 12/25/2022 13:39   MR CARDIAC VELOCITY FLOW MAP  Result Date: 12/25/2022 CLINICAL DATA:  Clinical question of PVCs Study assumes HCT of 41 and BSA of 1.99 m2. EXAM: CARDIAC MRI TECHNIQUE: The patient was scanned on a 1.5 Tesla GE magnet. A dedicated cardiac coil was used. Functional imaging was done using Fiesta sequences. 2,3, and 4 chamber views were done to assess for RWMA's. Modified Simpson's rule using a short axis stack was used to calculate an ejection fraction on a dedicated work Research officer, trade union. The patient received 9 cc of Gadavist. After 10 minutes inversion recovery sequences were used to assess for infiltration and scar tissue. Flow quantification was performed 2 times during this examination with flow quantification performed at the levels of the ascending aorta above the valve, pulmonary artery above the valve. CONTRAST:  9 cc  of Gadavist  FINDINGS: 1. Normal left ventricular size, with LVEDD 57 mm, and LVEDVi 90 mL/m2. Normal left ventricular thickness. Mild decrease in left ventricular systolic function (LVEF =43%). Global hypokinesis. Left ventricular parametric mapping notable for normal T2. Mild increase in ECV in the basal inferior (38%). There is no late gadolinium enhancement in the left ventricular myocardium. 2. Normal right ventricular size with RVEDVI 83 mL/m2. Normal right ventricular thickness. Normal right ventricular systolic function (RVEF =52%). There are no regional wall motion abnormalities or aneurysms. Qualitatively, RV T2 signal is increased in the RV free wall. There is no late gadolinium enhancement in the right ventricular myocardium. 3.  Mild biatrial dilation.  PFO noted. 4.  Normal size of the aortic root and pulmonary artery. Mild aortic dilation 40 mm. 5. Valve assessment: Aortic Valve: Tri-leaflet aortic valve. There is mild central regurgitation. Regurgitant fraction 14%. Pulmonic Valve: No significant regurgitation. Tricuspid Valve: There is moderate regurgitation. Regurgitant fraction 28%. Mitral Valve: There is mild to moderate regurgitation. Regurgitant fraction 16%. 6.  Normal pericardium.  No pericardial effusion. 7. Grossly, no extracardiac findings. Recommended dedicated study if concerned for non-cardiac pathology. IMPRESSION: 1.  Mild decrease in LVEF, 43%. 2. Mild increase in ECV signal no evidence of LGE suggestive of high risk scar. 3.  PFO noted. 4. Mild aortic dilation, 40 mm. Consider repeat cardiac imaging in one year. Riley Lam MD Electronically Signed   By: Riley Lam M.D.   On: 12/25/2022 13:39   MR CARDIAC VELOCITY FLOW MAP  Result Date: 12/25/2022 CLINICAL DATA:  Clinical question of PVCs Study assumes HCT of 41 and BSA of 1.99 m2. EXAM: CARDIAC MRI TECHNIQUE: The patient was scanned on a 1.5 Tesla GE magnet. A dedicated cardiac coil was used. Functional  imaging was  done using Fiesta sequences. 2,3, and 4 chamber views were done to assess for RWMA's. Modified Simpson's rule using a short axis stack was used to calculate an ejection fraction on a dedicated work Research officer, trade [employer]. The patient received 9 cc of Gadavist. After 10 minutes inversion recovery sequences were used to assess for infiltration and scar tissue. Flow quantification was performed 2 times during this examination with flow quantification performed at the levels of the ascending aorta above the valve, pulmonary artery above the valve. CONTRAST:  9 cc  of Gadavist FINDINGS: 1. Normal left ventricular size, with LVEDD 57 mm, and LVEDVi 90 mL/m2. Normal left ventricular thickness. Mild decrease in left ventricular systolic function (LVEF =43%). Global hypokinesis. Left ventricular parametric mapping notable for normal T2. Mild increase in ECV in the basal inferior (38%). There is no late gadolinium enhancement in the left ventricular myocardium. 2. Normal right ventricular size with RVEDVI 83 mL/m2. Normal right ventricular thickness. Normal right ventricular systolic function (RVEF =52%). There are no regional wall motion abnormalities or aneurysms. Qualitatively, RV T2 signal is increased in the RV free wall. There is no late gadolinium enhancement in the right ventricular myocardium. 3.  Mild biatrial dilation.  PFO noted. 4.  Normal size of the aortic root and pulmonary artery. Mild aortic dilation 40 mm. 5. Valve assessment: Aortic Valve: Tri-leaflet aortic valve. There is mild central regurgitation. Regurgitant fraction 14%. Pulmonic Valve: No significant regurgitation. Tricuspid Valve: There is moderate regurgitation. Regurgitant fraction 28%. Mitral Valve: There is mild to moderate regurgitation. Regurgitant fraction 16%. 6.  Normal pericardium.  No pericardial effusion. 7. Grossly, no extracardiac findings. Recommended dedicated study if concerned for non-cardiac pathology. IMPRESSION: 1.  Mild  decrease in LVEF, 43%. 2. Mild increase in ECV signal no evidence of LGE suggestive of high risk scar. 3.  PFO noted. 4. Mild aortic dilation, 40 mm. Consider repeat cardiac imaging in one year. Riley Lam MD Electronically Signed   By: Riley Lam M.D.   On: 12/25/2022 13:39   IR Catheter Tube Change  Result Date: 12/12/2022 INDICATION: 67 year old with neurogenic better and urinary retention. Patient presents for exchange of the suprapubic tube. Patient currently has a 16 [place] pigtail drain. EXAM: EXCHANGE OF SUPRAPUBIC TUBE WITH FLUOROSCOPY MEDICATIONS: Viscous lidocaine ANESTHESIA/SEDATION: None CONTRAST:  10 mL Omnipaque 300-administered into the collecting system(s) FLUOROSCOPY TIME:  Radiation Exposure Index (as provided by the fluoroscopic device): 1 mGy Kerma COMPLICATIONS: None immediate. PROCEDURE: The procedure was explained to the patient. The risks and benefits of the procedure were discussed and the patient's questions were addressed. Informed consent was obtained from the patient. Patient was placed supine on the interventional table. The suprapubic tube and surrounding skin was prepped and draped in sterile fashion. Maximal barrier sterile technique was utilized including caps, mask, sterile gowns, sterile gloves, sterile drape, hand hygiene and skin antiseptic. Contrast was injected through the suprapubic tube to confirm placement in the bladder. The retention suture was cut. The catheter was cut and removed over a superstiff Amplatz wire. A new 16 [place] catheter was easily advanced over the wire. The balloon was inflated with 10 mL of saline. Contrast injection confirmed placement in the bladder. FINDINGS: New suprapubic tube is within the bladder. IMPRESSION: Successful exchange of the suprapubic tube with fluoroscopy. Patient currently has a 36 [place] catheter. Electronically Signed   By: Richarda Overlie M.D.   On: 12/12/2022 14:02       Assessment/Plan No  diagnosis found.  Jason [name] is clinically stable today.  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    We recommended continued imaging surveillance of the cervical spine only at this time, next MRI in 12 months given long term stability.  Will con't to follow with Dr. Marcell Barlow, Dr. Cathie Hoops, urology team.  We appreciate the opportunity to participate in the care of Jason [name].   We ask that Jason [name] return to clinic in 12 months following next c-spine MRI, or sooner as needed.  All questions were answered. The patient knows to call the clinic with any problems, questions or concerns. No barriers to learning were detected.  The total time spent in the encounter was 40 minutes and more than 50% was on counseling and review of test results   Henreitta Leber, MD Medical Director of Neuro-Oncology [facility] [place] [place] 01/09/23 11:25 AM

## 2023-01-09 NOTE — Telephone Encounter (Signed)
Oral Oncology Patient Advocate Encounter   Submitted application for assistance for Nubeqa to [employer] [place] [employer].   Application submitted via e-fax to [phone]   BUSPA[employer]'s phone number [phone].   I will continue to check the status until final determination.    Ardeen Fillers, CPhT Oncology Pharmacy Patient Advocate  [facility]  [phone] (phone) [phone] (fax) 01/09/2023 3:05 PM

## 2023-01-09 NOTE — Telephone Encounter (Signed)
Oral Oncology Patient Advocate Encounter   Received notification that patient is due for re-enrollment for assistance for Nubeqa through [employer] [place] Patient [employer].   Re-enrollment process has been initiated and will be submitted upon completion of necessary documents.  BUSPAF's phone number [phone].   I will continue to follow until final determination.   Ardeen Fillers, CPhT Oncology Pharmacy Patient Advocate  [facility]  [phone] (phone) [phone] (fax) 01/09/2023 3:00 PM

## 2023-01-13 ENCOUNTER — Encounter: Payer: Self-pay | Admitting: Internal Medicine

## 2023-01-13 MED ORDER — ATORVASTATIN CALCIUM 40 MG PO TABS: 40.0000 mg | ORAL_TABLET | Freq: Every day | ORAL | 3 refills | Status: AC

## 2023-01-13 MED ORDER — TAMSULOSIN HCL 0.4 MG PO CAPS: 0.4000 mg | ORAL_CAPSULE | Freq: Every day | ORAL | 3 refills | Status: DC

## 2023-01-13 MED ORDER — LEVOTHYROXINE SODIUM 88 MCG PO TABS: 88.0000 ug | ORAL_TABLET | Freq: Every day | ORAL | 3 refills | Status: AC

## 2023-01-13 MED ORDER — POTASSIUM CHLORIDE CRYS ER 20 MEQ PO TBCR: 20.0000 meq | EXTENDED_RELEASE_TABLET | Freq: Every day | ORAL | 3 refills | Status: AC

## 2023-01-13 MED ORDER — METOPROLOL SUCCINATE ER 50 MG PO TB24: 50.0000 mg | ORAL_TABLET | Freq: Every day | ORAL | 3 refills | Status: DC

## 2023-01-13 MED ORDER — FLUOXETINE HCL 20 MG PO CAPS: 20.0000 mg | ORAL_CAPSULE | Freq: Every day | ORAL | 3 refills | Status: AC

## 2023-01-14 MED ORDER — CLONAZEPAM 0.5 MG PO TABS: 0.5000 mg | ORAL_TABLET | Freq: Two times a day (BID) | ORAL | 0 refills | Status: DC

## 2023-01-16 ENCOUNTER — Encounter
Payer: Medicare Other | Attending: Physical Medicine and Rehabilitation | Admitting: Physical Medicine and Rehabilitation

## 2023-01-16 ENCOUNTER — Encounter: Payer: Self-pay | Admitting: Physical Medicine and Rehabilitation

## 2023-01-16 VITALS — BP 120/74 | HR 60 | Ht 69.0 in

## 2023-01-16 DIAGNOSIS — K592 Neurogenic bowel, not elsewhere classified: Secondary | ICD-10-CM | POA: Insufficient documentation

## 2023-01-16 DIAGNOSIS — G8250 Quadriplegia, unspecified: Secondary | ICD-10-CM | POA: Insufficient documentation

## 2023-01-16 DIAGNOSIS — F39 Unspecified mood [affective] disorder: Secondary | ICD-10-CM | POA: Insufficient documentation

## 2023-01-16 DIAGNOSIS — I63521 Cerebral infarction due to unspecified occlusion or stenosis of right anterior cerebral artery: Secondary | ICD-10-CM | POA: Diagnosis not present

## 2023-01-16 DIAGNOSIS — Z993 Dependence on wheelchair: Secondary | ICD-10-CM | POA: Insufficient documentation

## 2023-01-16 NOTE — Patient Instructions (Addendum)
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    Add Another dig stim- 10 minutes later and see if helps having BM in middle of night.   2.  We discussed bowel program in general- and how to reduce bowel movements when don't want them.    3.  "Jason [name]" per pt/wife turn off sometimes very abruptly-  - no laughing abruptly- has frustration- is normal for him.  -doesn't think it's pseudobulbar affect. I think it's mood that's affected.   4.  Got medicare and supplement-   5. Got Suprapubic catheter- con't -discussed will take awhile to heal completely and stop bleeding.   6. Gets Entresto and [place] through patient assistance program.   7. F/U in 3 months- double appt. SCI  8. Speak to PCP about increasing Prozac 40 mg daily

## 2023-01-16 NOTE — Progress Notes (Signed)
Subjective:    Patient ID: Jason [name], male    DOB: 09/18/1955, 67 y.o.   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     Had MRI's 12/25/22- cervical spine- according to Dr Barbaraann CaoMicah Flesher over MRI results with pt and wife.   Cards appt  12/13- on Friday- Eliquard-   Immunotherapy? (Treatment for Cancer) Injection also that day.  Every 6 months  No particular SCI wise.    Used to hear tremors in legs- per wife -mainly at night-    Has pressure ulcer on R buttock- - feeling some pressure from it- not doing pressure relief as often as should.   Working with a new PT- H/H- 2x/week  Prior PT gets him standing at [employer]- and had to back off- transitioned to [facility] October Working on transfers more than anything right now.  Learning to get to know new therapist for PT-  Was standing for 3 minutes then 5 minutes-   Gets frustrated in this long process of improving from SCI- so PT is one of his triggers sometimes.   Saw Dr Barbaraann Cao last Friday.  Went well.   UTI's- none since saw me last- switched to [facility] a few months ago and doing so much better- nursing coming out 1x/week on pressure ulcer and checking  SPC No hematuria- a little bleeding at incision site.   Bowel program going OK BM sometimes in middle of night-  But sometimes with bowel program.   Sometimes has smear when stands- and doesn't know it until has bowel program.      Pain Inventory Average Pain 1 Pain Right Now 3 My pain is intermittent and sharp  LOCATION OF PAIN  buttocks  BOWEL Number of stools per week: 6 Oral laxative use Yes  Type of laxative senna/suppository Enema or suppository use  yes suppository Incontinent Yes    BLADDER Suprapubic Bladder incontinence Yes   Mobility ability to climb steps?  no do you drive?  no use a wheelchair needs help with transfers Do you have any goals in this area?  yes  Function not employed: date last employed FEB 2023 I need assistance with the following:  dressing, bathing, and toileting  Neuro/Psych bladder control problems bowel control problems tingling spasms  Prior Studies Any changes since last visit?  no  Physicians involved in your care Any changes since last visit?  no   Family History  Problem Relation Age of Onset   Hypertension Mother    Stroke Mother    Gout Father    Heart attack Father 26   Heart disease Sister    Lung cancer Sister    Drug abuse Sister    Breast cancer Paternal Aunt    Lung cancer Maternal Grandmother    Colon cancer Neg Hx    Esophageal cancer Neg Hx    Rectal cancer Neg Hx    Stomach cancer Neg Hx    Social History   Socioeconomic History   Marital status: Married    Spouse name: [name]    Number of children: 0   Years of education: Not on file   Highest education level: Bachelor's degree (e.g., BA, AB, BS)  Occupational History   Occupation: Clinical cytogeneticist  Employer: [employer]  Tobacco Use   Smoking status: Never    Passive exposure: Never   Smokeless tobacco: Never  Vaping Use   Vaping status: Never Used  Substance and Sexual Activity   Alcohol use: Not Currently   Drug use: No   Sexual activity: Not Currently  Other Topics Concern   Not on file  Social History Narrative   Lives at home with spouse.    Social Determinants of Health   Financial Resource Strain: Low Risk  (06/30/2022)   Overall Financial Resource Strain (CARDIA)    Difficulty of Paying Living Expenses: Not hard at all  Food Insecurity: No Food Insecurity (06/30/2022)   Hunger Vital Sign    Worried About Running Out of Food in the Last Year: Never true    Ran Out of Food in the Last Year: Never true  Transportation  Needs: No Transportation Needs (06/30/2022)   PRAPARE - Administrator, Civil Service (Medical): No    Lack of Transportation (Non-Medical): No  Physical Activity: Unknown (06/30/2022)   Exercise Vital Sign    Days of Exercise per Week: 0 days    Minutes of Exercise per Session: Not on file  Stress: No Stress Concern Present (06/30/2022)   [employer] of Occupational Health - Occupational Stress Questionnaire    Feeling of Stress : Only a little  Social Connections: Moderately Integrated (06/30/2022)   Social Connection and Isolation Panel [NHANES]    Frequency of Communication with Friends and Family: Three times a week    Frequency of Social Gatherings with Friends and Family: More than three times a week    Attends Religious Services: Never    Database administrator or Organizations: Yes    Attends Banker Meetings: 1 to 4 times per year    Marital Status: Married   Past Surgical History:  Procedure Laterality Date   CARDIAC CATHETERIZATION  03/2000   HERNIA REPAIR  1961   umbilical   IR CATHETER TUBE CHANGE  12/12/2022   IR US GUIDE BX ASP/DRAIN  11/11/2022   LEFT HEART CATH AND CORONARY ANGIOGRAPHY Left 03/20/2020   Procedure: LEFT HEART CATH AND CORONARY ANGIOGRAPHY;  Surgeon: Yvonne Kendall, MD;  Location: [facility] INVASIVE CV LAB;  Service: Cardiovascular;  Laterality: Left;   POSTERIOR CERVICAL FUSION/FORAMINOTOMY N/A 10/06/2021   Procedure: POSTERIOR CERVICAL FUSION/ FORAMINOTOMY LEVEL 3;  Surgeon: Venetia Night, MD;  Location: [facility] ORS;  Service: Neurosurgery;  Laterality: N/A;  will need monitoring   RADIOLOGY WITH ANESTHESIA Left 04/18/2021   Procedure: MRI SHOULDER WITHOUT CONTRAST WITH ANESTHESIA;  Surgeon: Radiologist, Medication, MD;  Location: [facility] OR;  Service: Radiology;  Laterality: Left;   RADIOLOGY WITH ANESTHESIA N/A 10/03/2021   Procedure: MRI CERVICAL SPINE WITH ANESTHESIA;  Surgeon: Radiologist, Medication, MD;  Location: [facility] OR;   Service: Radiology;  Laterality: N/A;   RADIOLOGY WITH ANESTHESIA N/A 01/30/2022   Procedure: MRI CERVICAL SPINE WITH AND WITOHOUT CONTRAST WITH ANESTHESIA;  Surgeon: Radiologist, Medication, MD;  Location: [facility] OR;  Service: Radiology;  Laterality: N/A;   RADIOLOGY WITH ANESTHESIA N/A 05/22/2022   Procedure: MRI LUMBER SPINE AND CERVIC SPINE WITH AND WITHOUT CONTRAST;  Surgeon: Radiologist, Medication, MD;  Location: [facility] OR;  Service: Radiology;  Laterality: N/A;   RADIOLOGY WITH ANESTHESIA N/A 12/25/2022   Procedure: MRI WITH ANESTHESIA OF CERVICAL SPINE WITH AND WITHOUT CONTRAST,CARDIAC MORPHOLOGY;  Surgeon: Radiologist, Medication, MD;  Location: [facility] OR;  Service: Radiology;  Laterality: N/A;   REVERSE SHOULDER  ARTHROPLASTY Left    ROTATOR CUFF REPAIR  11/2010   Dr Hyacinth Meeker   SUPRAPUBIC CATHETER INSERTION  11/11/2022   TONSILLECTOMY     TOTAL HIP ARTHROPLASTY Right 02/24/2009   TOTAL HIP ARTHROPLASTY Left 06/2016   Dr Odis Luster   Past Medical History:  Diagnosis Date   Allergy    Arthritis    osteoarthritis   Cancer [facility])    prostate   CHF (congestive heart failure) (HCC)    GERD (gastroesophageal reflux disease)    History of kidney stones    passed   Hypertension    Hypothyroidism    Palpitations 2022   Paralysis (HCC)    paraplegia 09/2021-surgery or stroke   Pneumonia    12/22/28- > 25 years ago   Stroke [facility])    Mini stroke - Aug 2023   Thyroid disease    Hyperthyroidism s/p radioactive iodine ablation   BP 120/74   Pulse 60   Ht 5\' 9"  (1.753 m)   SpO2 96%   BMI 26.43 kg/m   Opioid Risk Score:   Fall Risk Score:  `1  Depression screen PHQ 2/9     01/16/2023    1:09 PM 09/19/2022   12:55 PM 12/02/2021    1:20 PM 10/15/2020    3:41 PM 10/10/2019    3:10 PM 10/05/2018    7:54 AM 08/28/2017    8:28 AM  Depression screen PHQ 2/9  Decreased Interest 0 0 0 0 0 0 0  Down, Depressed, Hopeless 0 0 1 0 0 0 0  PHQ - 2 Score 0 0 1 0 0 0 0  Altered sleeping   0      Tired,  decreased energy   1      Change in appetite   0      Feeling bad or failure about yourself    0      Trouble concentrating   0      Moving slowly or fidgety/restless   0      Suicidal thoughts   0      PHQ-9 Score   2         Review of Systems  Musculoskeletal:        Pain in buttocks   All other systems reviewed and are negative.     Objective:   Physical Exam  Awake, alert, appropriate, some memory issues noticeable, in power w/c; accompanied by wife, NAD  MSK UE"s- deltoids 4-/5; biceps 5/5; WE 5/5 on R 4/5 on L; triceps 5/5 B/L; grip 4+/5 B/L and FA 4+/5 B/L LE's-  RLE- HF 2-/5; KE 3+/5; DF 3+/5; PF 3+/5 LLE- HF 3-/5; KE 4/5; DF 4/5 and PF 4/5  Neuro: MAS of 1 in LE-s no clonus B/L  Wearing thigh high TEDs B/L And SPC in place- looks OK.   No hematuria.        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    Add Another dig stim- 10 minutes later and see if helps having BM in middle of night.   2.  We discussed bowel program in general- and how to reduce bowel movements when don't want them.    3.  "Madelyn Flavors" per pt/wife turn off sometimes very abruptly-  -  no laughing abruptly- has frustration- is normal for him.  -doesn't think it's pseudobulbar affect. I think it's mood that's affected.   4.  Got medicare and supplement-   5. Got Suprapubic catheter- con't -discussed will take awhile to heal completely and stop bleeding.   6. Gets Entresto and [place] through patient assistance program.   7. F/U in 3 months- double appt. SCI  8. Speak to PCP about increasing Prozac 40 mg daily   I spent a total of 42   minutes on total care today- >50% coordination of care- due to d/w pt and wife about his mood- and how to treat; SPC;  medicare coverage; neurogenic bowel

## 2023-01-20 NOTE — Telephone Encounter (Signed)
Oral Oncology Patient Advocate Encounter   Received notification re-enrollment for assistance for Nubeqa through [place] Patient [facility] has been approved. Patient may continue to receive their medication at $0 from this program.    BUSPAF's phone number [phone].   Effective dates: 02/25/23 through 02/24/24.   Ardeen Fillers, CPhT Oncology Pharmacy Patient Advocate  [facility]  [phone] (phone) [phone] (fax) 01/20/2023 8:00 AM

## 2023-01-26 ENCOUNTER — Telehealth: Payer: Self-pay | Admitting: Internal Medicine

## 2023-01-26 NOTE — Telephone Encounter (Signed)
Verbal orders left on verified VM for [facility].

## 2023-01-26 NOTE — Telephone Encounter (Signed)
Home Health verbal orders Caller Name: Thayer Ohm, [place] Agency Name: Melony Overly number: [phone]  Requesting PT Extension   Reason: Continued training for transfers, strengthening and pressure sore prevention.   Frequency: 2wk 8  Please forward to [facility] pool or providers CMA

## 2023-01-27 ENCOUNTER — Encounter: Payer: Self-pay | Admitting: Oncology

## 2023-01-28 ENCOUNTER — Encounter: Payer: Self-pay | Admitting: Internal Medicine

## 2023-01-28 ENCOUNTER — Ambulatory Visit: Payer: Medicare Other | Admitting: Internal Medicine

## 2023-01-28 VITALS — BP 120/78 | HR 56 | Resp 18

## 2023-01-28 DIAGNOSIS — G8220 Paraplegia, unspecified: Secondary | ICD-10-CM | POA: Diagnosis not present

## 2023-01-28 DIAGNOSIS — Z9359 Other cystostomy status: Secondary | ICD-10-CM

## 2023-01-28 DIAGNOSIS — C61 Malignant neoplasm of prostate: Secondary | ICD-10-CM

## 2023-01-28 DIAGNOSIS — I5020 Unspecified systolic (congestive) heart failure: Secondary | ICD-10-CM

## 2023-01-28 DIAGNOSIS — F39 Unspecified mood [affective] disorder: Secondary | ICD-10-CM | POA: Diagnosis not present

## 2023-01-28 NOTE — Assessment & Plan Note (Signed)
Still working with arms for ADLs---needs help with all except eating but is getting stronger Able to stand at sink--but still very limited with legs

## 2023-01-28 NOTE — Assessment & Plan Note (Signed)
Ongoing issues Will be tearful at times--but still mostly reactive and frustration No MDD Will continue just 20mg  of the fluoxetine

## 2023-01-28 NOTE — Assessment & Plan Note (Signed)
Markedly improved EF on recent cardiac MRI Continues on entresto 24/26 bid--and metoprolol 50 Hasn't gotten jardiance yet---perhaps the cardiologist will rethink this with the improved EF

## 2023-01-28 NOTE — Assessment & Plan Note (Signed)
No problems with this May be due for change soon

## 2023-01-28 NOTE — Progress Notes (Signed)
Subjective:    Patient ID: Jason [name], male    DOB: September 01, 1955, 67 y.o.   MRN: [id number]  HPI Home visit for follow up of paraplegia and home bound status Wife here as usual  Doing fairly well Questions the jardiance---Rx by cardiologists who prescribed it Was trying to get it through patient assistance Reviewed his recent cardiac MRI---showed only mildly reduced EF now No SIB No chest pain Occasional mild edema---nothing significant No palpitations  Doing well with therapists Still gets frustrated by limitations New PT aide---works differently and trying to adjust. Doesn't focus as much on trying to stand (that is important to him). Has stood for as long as 10-12 minutes in the past Eats herself Working with OT on personal care---it still takes him a long time (and doesn't have time when he has to get out for an appointment)  Recent visit with physiatrist Mild emotional issues--and he was weepy there  No persistent depressed mood Mostly frustration at times  Still satisfied with the bowel regimen Uses suppository before and after rectal stimulation in the evening Will move bowels at times with the stimulation---but mostly will go in the night (and he doesn't feel it). No ulcerations by rectum with this  Still with small sacral ulcer Will crust and seem to heal--but often opens again   Continues on the darolutamide for prostate cancer Seeing Dr Cathie Hoops soon  Wife still transfers him with Michiel Sites lift Waits for neighbor to help---just for stability Still considering sit to stand lift--may legs may not be strong enough  Current Outpatient Medications on File Prior to Visit  Medication Sig Dispense Refill   acetaminophen (TYLENOL) 500 MG tablet Take 1,000 mg by mouth 2 (two) times daily.     amoxicillin (AMOXIL) 500 MG capsule Take 2,000 mg by mouth See admin instructions. Take 2000 mg 1 hour prior to dental work     ascorbic acid (VITAMIN C) 500 MG tablet Take 1 tablet  (500 mg total) by mouth daily. 30 tablet 0   aspirin EC 81 MG tablet Take 1 tablet (81 mg total) by mouth daily. Swallow whole. (Patient taking differently: Take 162 mg by mouth daily. Swallow whole.) 30 tablet 12   atorvastatin (LIPITOR) 40 MG tablet Take 1 tablet (40 mg total) by mouth daily. 90 tablet 3   cetirizine (ZYRTEC) 10 MG tablet Take 10 mg by mouth at bedtime.     clonazePAM (KLONOPIN) 0.5 MG tablet Take 1 tablet (0.5 mg total) by mouth 2 (two) times daily. 180 tablet 0   CRANBERRY PO Take 1 tablet by mouth 2 (two) times daily.     darolutamide (NUBEQA) 300 MG tablet Take 600 mg by mouth 2 (two) times daily with a meal.     empagliflozin (JARDIANCE) 10 MG TABS tablet Take 1 tablet (10 mg total) by mouth daily before breakfast. (Patient not taking: Reported on 01/16/2023) 90 tablet 3   famotidine (PEPCID) 20 MG tablet Take 1 tablet (20 mg total) by mouth daily. (Patient taking differently: Take 20 mg by mouth at bedtime.) 30 tablet 0   FLUoxetine (PROZAC) 20 MG capsule Take 1 capsule (20 mg total) by mouth at bedtime. 90 capsule 3   fluticasone (FLONASE) 50 MCG/ACT nasal spray Place 1 spray into both nostrils daily. 48 g 3   guaiFENesin (MUCINEX) 600 MG 12 hr tablet Take 600 mg by mouth 2 (two) times daily.     levothyroxine (SYNTHROID) 88 MCG tablet Take 1 tablet (88 mcg total)  by mouth daily. 90 tablet 3   metoprolol succinate (TOPROL-XL) 50 MG 24 hr tablet Take 1 tablet (50 mg total) by mouth at bedtime. Take with or immediately following a meal. 90 tablet 3   Multiple Vitamin (MULTIVITAMIN) tablet Take 1 tablet by mouth daily.     potassium chloride SA (KLOR-CON M) 20 MEQ tablet Take 1 tablet (20 mEq total) by mouth at bedtime. 90 tablet 3   sacubitril-valsartan (ENTRESTO) 24-26 MG Take 1 tablet by mouth 2 (two) times daily. 28 tablet 0   sennosides-docusate sodium (SENOKOT-S) 8.6-50 MG tablet Take 2 tablets by mouth daily. 60 tablet 11   tamsulosin (FLOMAX) 0.4 MG CAPS capsule Take  1 capsule (0.4 mg total) by mouth daily. 90 capsule 3   No current facility-administered medications on file prior to visit.    No Known Allergies  Past Medical History:  Diagnosis Date   Allergy    Arthritis    osteoarthritis   Cancer (HCC)    prostate   CHF (congestive heart failure) (HCC)    GERD (gastroesophageal reflux disease)    History of kidney stones    passed   Hypertension    Hypothyroidism    Palpitations 2022   Paralysis (HCC)    paraplegia 09/2021-surgery or stroke   Pneumonia    12/22/28- > 25 years ago   Stroke [facility])    Mini stroke - Aug 2023   Thyroid disease    Hyperthyroidism s/p radioactive iodine ablation    Past Surgical History:  Procedure Laterality Date   CARDIAC CATHETERIZATION  03/2000   HERNIA REPAIR  1961   umbilical   IR CATHETER TUBE CHANGE  12/12/2022   IR US GUIDE BX ASP/DRAIN  11/11/2022   LEFT HEART CATH AND CORONARY ANGIOGRAPHY Left 03/20/2020   Procedure: LEFT HEART CATH AND CORONARY ANGIOGRAPHY;  Surgeon: Yvonne Kendall, MD;  Location: [facility] INV[facility]IVE CV [facility];  Service: Cardiovascular;  Laterality: Left;   POSTERIOR CERVICAL FUSION/FORAMINOTOMY N/A 10/06/2021   Procedure: POSTERIOR CERVICAL FUSION/ FORAMINOTOMY LEVEL 3;  Surgeon: Venetia Night, MD;  Location: [facility] ORS;  Service: Neurosurgery;  Laterality: N/A;  will need monitoring   RADIOLOGY WITH ANESTHESIA Left 04/18/2021   Procedure: MRI SHOULDER WITHOUT CONTRAST WITH ANESTHESIA;  Surgeon: Radiologist, Medication, MD;  Location: [facility] OR;  Service: Radiology;  Laterality: Left;   RADIOLOGY WITH ANESTHESIA N/A 10/03/2021   Procedure: MRI CERVICAL SPINE WITH ANESTHESIA;  Surgeon: Radiologist, Medication, MD;  Location: [facility] OR;  Service: Radiology;  Laterality: N/A;   RADIOLOGY WITH ANESTHESIA N/A 01/30/2022   Procedure: MRI CERVICAL SPINE WITH AND WITOHOUT CONTRAST WITH ANESTHESIA;  Surgeon: Radiologist, Medication, MD;  Location: [facility] OR;  Service: Radiology;  Laterality: N/A;    RADIOLOGY WITH ANESTHESIA N/A 05/22/2022   Procedure: MRI LUMBER SPINE AND CERVIC SPINE WITH AND WITHOUT CONTRAST;  Surgeon: Radiologist, Medication, MD;  Location: [facility] OR;  Service: Radiology;  Laterality: N/A;   RADIOLOGY WITH ANESTHESIA N/A 12/25/2022   Procedure: MRI WITH ANESTHESIA OF CERVICAL SPINE WITH AND WITHOUT CONTRAST,CARDIAC MORPHOLOGY;  Surgeon: Radiologist, Medication, MD;  Location: [facility] OR;  Service: Radiology;  Laterality: N/A;   REVERSE SHOULDER ARTHROPLASTY Left    ROTATOR CUFF REPAIR  11/2010   Dr Hyacinth Meeker   SUPRAPUBIC CATHETER INSERTION  11/11/2022   TONSILLECTOMY     TOTAL HIP ARTHROPLASTY Right 02/24/2009   TOTAL HIP ARTHROPLASTY Left 06/2016   Dr Odis Luster    Family History  Problem Relation Age of Onset   Hypertension Mother  Stroke Mother    Gout Father    Heart attack Father 62   Heart disease Sister    Lung cancer Sister    Drug abuse Sister    Breast cancer Paternal Aunt    Lung cancer Maternal Grandmother    Colon cancer Neg Hx    Esophageal cancer Neg Hx    Rectal cancer Neg Hx    Stomach cancer Neg Hx     Social History   Socioeconomic History   Marital status: Married    Spouse name: [name]    Number of children: 0   Years of education: Not on file   Highest education level: Bachelor's degree (e.g., BA, AB, BS)  Occupational History   Occupation: Warden/ranger: [employer]  Tobacco Use   Smoking status: Never    Passive exposure: Never   Smokeless tobacco: Never  Vaping Use   Vaping status: Never Used  Substance and Sexual Activity   Alcohol use: Not Currently   Drug use: No   Sexual activity: Not Currently  Other Topics Concern   Not on file  Social History Narrative   Lives at home with spouse.    Social Determinants of Health   Financial Resource Strain: Low Risk  (01/27/2023)   Overall Financial Resource Strain (CARDIA)    Difficulty of Paying Living Expenses: Not hard at all  Food Insecurity: No Food Insecurity  (01/27/2023)   Hunger Vital Sign    Worried About Running Out of Food in the Last Year: Never true    Ran Out of Food in the Last Year: Never true  Transportation Needs: No Transportation Needs (01/27/2023)   PRAPARE - Administrator, Civil Service (Medical): No    Lack of Transportation (Non-Medical): No  Physical Activity: Unknown (01/27/2023)   Exercise Vital Sign    Days of Exercise per Week: 0 days    Minutes of Exercise per Session: Not on file  Stress: No Stress Concern Present (01/27/2023)   [employer] of Occupational Health - Occupational Stress Questionnaire    Feeling of Stress : Only a little  Social Connections: Moderately Integrated (01/27/2023)   Social Connection and Isolation Panel [NHANES]    Frequency of Communication with Friends and Family: Three times a week    Frequency of Social Gatherings with Friends and Family: More than three times a week    Attends Religious Services: Never    Database administrator or Organizations: No    Attends Engineer, structural: 1 to 4 times per year    Marital Status: Married  Catering manager Violence: Not on file   Review of Systems Appetite is okay--but wife wonders if he needs more food Are considering protein shakes or ensure--discussed that this would be good Weight seems fairly stable Sleeps well Gets some pain in right buttock---worst when standing. Improves with ROM exercises with leg (with foot in gait belt and he then moves it around), also improves tiliting his chair back    Objective:   Physical Exam Constitutional:      Comments: Looks fine in motorized chair Mild wasting still  Cardiovascular:     Rate and Rhythm: Regular rhythm. Bradycardia present.     Heart sounds:     No gallop.     Comments: ?very soft systolic murmur Pulmonary:     Effort: Pulmonary effort is normal.     Breath sounds: Normal breath sounds. No wheezing or rales.  Abdominal:  Palpations: Abdomen is soft.      Tenderness: There is no abdominal tenderness.     Comments: Suprapubic catheter in place  Musculoskeletal:     Cervical back: Neck supple.  Lymphadenopathy:     Cervical: No cervical adenopathy.  Neurological:     Mental Status: He is alert.     Comments: Arms 3+-4 Left leg 3/5, right leg 2+/5            Assessment & Plan:

## 2023-01-28 NOTE — Assessment & Plan Note (Signed)
Going back to Dr Cathie Hoops Recent cervical MRI shows stable status

## 2023-02-06 ENCOUNTER — Inpatient Hospital Stay: Payer: Medicare Other

## 2023-02-06 ENCOUNTER — Encounter: Payer: Self-pay | Admitting: Oncology

## 2023-02-06 ENCOUNTER — Ambulatory Visit (INDEPENDENT_AMBULATORY_CARE_PROVIDER_SITE_OTHER): Payer: Medicare Other | Admitting: Medical

## 2023-02-06 ENCOUNTER — Inpatient Hospital Stay (HOSPITAL_BASED_OUTPATIENT_CLINIC_OR_DEPARTMENT_OTHER): Payer: Medicare Other | Admitting: Oncology

## 2023-02-06 ENCOUNTER — Encounter: Payer: Self-pay | Admitting: Medical

## 2023-02-06 ENCOUNTER — Inpatient Hospital Stay: Payer: Medicare Other | Attending: Anatomic Pathology & Clinical Pathology

## 2023-02-06 VITALS — BP 90/60 | HR 58 | Ht 69.0 in | Wt 155.0 lb

## 2023-02-06 VITALS — BP 115/67 | HR 53 | Resp 18

## 2023-02-06 DIAGNOSIS — I472 Ventricular tachycardia, unspecified: Secondary | ICD-10-CM | POA: Insufficient documentation

## 2023-02-06 DIAGNOSIS — I493 Ventricular premature depolarization: Secondary | ICD-10-CM | POA: Insufficient documentation

## 2023-02-06 DIAGNOSIS — Z8673 Personal history of transient ischemic attack (TIA), and cerebral infarction without residual deficits: Secondary | ICD-10-CM | POA: Diagnosis not present

## 2023-02-06 DIAGNOSIS — Z79899 Other long term (current) drug therapy: Secondary | ICD-10-CM | POA: Insufficient documentation

## 2023-02-06 DIAGNOSIS — C7951 Secondary malignant neoplasm of bone: Secondary | ICD-10-CM | POA: Diagnosis present

## 2023-02-06 DIAGNOSIS — Z79818 Long term (current) use of other agents affecting estrogen receptors and estrogen levels: Secondary | ICD-10-CM | POA: Diagnosis not present

## 2023-02-06 DIAGNOSIS — I110 Hypertensive heart disease with heart failure: Secondary | ICD-10-CM | POA: Diagnosis not present

## 2023-02-06 DIAGNOSIS — Z8701 Personal history of pneumonia (recurrent): Secondary | ICD-10-CM | POA: Insufficient documentation

## 2023-02-06 DIAGNOSIS — I5022 Chronic systolic (congestive) heart failure: Secondary | ICD-10-CM | POA: Insufficient documentation

## 2023-02-06 DIAGNOSIS — E039 Hypothyroidism, unspecified: Secondary | ICD-10-CM | POA: Diagnosis not present

## 2023-02-06 DIAGNOSIS — M4802 Spinal stenosis, cervical region: Secondary | ICD-10-CM | POA: Insufficient documentation

## 2023-02-06 DIAGNOSIS — M47816 Spondylosis without myelopathy or radiculopathy, lumbar region: Secondary | ICD-10-CM | POA: Diagnosis not present

## 2023-02-06 DIAGNOSIS — M48061 Spinal stenosis, lumbar region without neurogenic claudication: Secondary | ICD-10-CM | POA: Diagnosis not present

## 2023-02-06 DIAGNOSIS — Z9089 Acquired absence of other organs: Secondary | ICD-10-CM | POA: Insufficient documentation

## 2023-02-06 DIAGNOSIS — I471 Supraventricular tachycardia, unspecified: Secondary | ICD-10-CM | POA: Diagnosis not present

## 2023-02-06 DIAGNOSIS — Z801 Family history of malignant neoplasm of trachea, bronchus and lung: Secondary | ICD-10-CM | POA: Insufficient documentation

## 2023-02-06 DIAGNOSIS — C7940 Secondary malignant neoplasm of unspecified part of nervous system: Secondary | ICD-10-CM | POA: Insufficient documentation

## 2023-02-06 DIAGNOSIS — Z823 Family history of stroke: Secondary | ICD-10-CM | POA: Insufficient documentation

## 2023-02-06 DIAGNOSIS — Z86718 Personal history of other venous thrombosis and embolism: Secondary | ICD-10-CM

## 2023-02-06 DIAGNOSIS — C61 Malignant neoplasm of prostate: Secondary | ICD-10-CM | POA: Diagnosis not present

## 2023-02-06 DIAGNOSIS — G8250 Quadriplegia, unspecified: Secondary | ICD-10-CM

## 2023-02-06 DIAGNOSIS — Z803 Family history of malignant neoplasm of breast: Secondary | ICD-10-CM | POA: Insufficient documentation

## 2023-02-06 DIAGNOSIS — K219 Gastro-esophageal reflux disease without esophagitis: Secondary | ICD-10-CM | POA: Insufficient documentation

## 2023-02-06 DIAGNOSIS — I77819 Aortic ectasia, unspecified site: Secondary | ICD-10-CM | POA: Insufficient documentation

## 2023-02-06 DIAGNOSIS — G8252 Quadriplegia, C1-C4 incomplete: Secondary | ICD-10-CM | POA: Insufficient documentation

## 2023-02-06 DIAGNOSIS — L905 Scar conditions and fibrosis of skin: Secondary | ICD-10-CM | POA: Diagnosis not present

## 2023-02-06 DIAGNOSIS — K5730 Diverticulosis of large intestine without perforation or abscess without bleeding: Secondary | ICD-10-CM | POA: Insufficient documentation

## 2023-02-06 DIAGNOSIS — I080 Rheumatic disorders of both mitral and aortic valves: Secondary | ICD-10-CM | POA: Insufficient documentation

## 2023-02-06 DIAGNOSIS — I700 Atherosclerosis of aorta: Secondary | ICD-10-CM | POA: Insufficient documentation

## 2023-02-06 DIAGNOSIS — N319 Neuromuscular dysfunction of bladder, unspecified: Secondary | ICD-10-CM

## 2023-02-06 DIAGNOSIS — Z8249 Family history of ischemic heart disease and other diseases of the circulatory system: Secondary | ICD-10-CM | POA: Insufficient documentation

## 2023-02-06 DIAGNOSIS — Q2112 Patent foramen ovale: Secondary | ICD-10-CM | POA: Insufficient documentation

## 2023-02-06 DIAGNOSIS — Z87442 Personal history of urinary calculi: Secondary | ICD-10-CM | POA: Insufficient documentation

## 2023-02-06 DIAGNOSIS — I10 Essential (primary) hypertension: Secondary | ICD-10-CM | POA: Insufficient documentation

## 2023-02-06 DIAGNOSIS — Z814 Family history of other substance abuse and dependence: Secondary | ICD-10-CM | POA: Insufficient documentation

## 2023-02-06 LAB — CMP (CANCER CENTER ONLY)
ALT: 22 U/L (ref 0–44)
AST: 23 U/L (ref 15–41)
Albumin: 3.5 g/dL (ref 3.5–5.0)
Alkaline Phosphatase: 91 U/L (ref 38–126)
Anion gap: 8 (ref 5–15)
BUN: 9 mg/dL (ref 8–23)
CO2: 28 mmol/L (ref 22–32)
Calcium: 9 mg/dL (ref 8.9–10.3)
Chloride: 102 mmol/L (ref 98–111)
Creatinine: 0.34 mg/dL — ABNORMAL LOW (ref 0.61–1.24)
GFR, Estimated: >60 mL/min (ref >60)
Glucose, Bld: 88 mg/dL (ref 70–99)
Potassium: 4.4 mmol/L (ref 3.5–5.1)
Sodium: 138 mmol/L (ref 135–145)
Total Bilirubin: 0.9 mg/dL (ref <1.2)
Total Protein: 6.2 g/dL — ABNORMAL LOW (ref 6.5–8.1)

## 2023-02-06 LAB — CBC WITH DIFFERENTIAL (CANCER CENTER ONLY)
Abs Immature Granulocytes: 0.01 10*3/uL (ref 0.00–0.07)
Basophils Absolute: 0 10*3/uL (ref 0.0–0.1)
Basophils Relative: 1 %
Eosinophils Absolute: 0.1 10*3/uL (ref 0.0–0.5)
Eosinophils Relative: 2 %
HCT: 37.4 % — ABNORMAL LOW (ref 39.0–52.0)
Hemoglobin: 12.4 g/dL — ABNORMAL LOW (ref 13.0–17.0)
Immature Granulocytes: 0 %
Lymphocytes Relative: 21 %
Lymphs Abs: 0.9 10*3/uL (ref 0.7–4.0)
MCH: 33.7 pg (ref 26.0–34.0)
MCHC: 33.2 g/dL (ref 30.0–36.0)
MCV: 101.6 fL — ABNORMAL HIGH (ref 80.0–100.0)
Monocytes Absolute: 0.3 10*3/uL (ref 0.1–1.0)
Monocytes Relative: 8 %
Neutro Abs: 2.9 10*3/uL (ref 1.7–7.7)
Neutrophils Relative %: 68 %
Platelet Count: 261 10*3/uL (ref 150–400)
RBC: 3.68 MIL/uL — ABNORMAL LOW (ref 4.22–5.81)
RDW: 13.1 % (ref 11.5–15.5)
WBC Count: 4.3 10*3/uL (ref 4.0–10.5)
nRBC: 0 % (ref 0.0–0.2)

## 2023-02-06 LAB — PSA: Prostatic Specific Antigen: 3.49 ng/mL (ref 0.00–4.00)

## 2023-02-06 MED ORDER — LEUPROLIDE ACETATE (6 MONTH) 45 MG ~~LOC~~ KIT
45.0000 mg | PACK | Freq: Once | SUBCUTANEOUS | Status: AC
  Administered 2023-02-06: 45 mg via SUBCUTANEOUS
  Filled 2023-02-06: qty 45

## 2023-02-06 MED ORDER — NUBEQA 300 MG PO TABS: 600.0000 mg | ORAL_TABLET | Freq: Two times a day (BID) | ORAL | 2 refills | Status: DC

## 2023-02-06 MED ORDER — METOPROLOL SUCCINATE ER 25 MG PO TB24: 25.0000 mg | ORAL_TABLET | Freq: Every day | ORAL | 3 refills | Status: AC

## 2023-02-06 NOTE — Assessment & Plan Note (Signed)
Continue follow up with urology

## 2023-02-06 NOTE — Progress Notes (Signed)
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 Currently on chemoprevention therapy with Eligard.  Status post palliative radiation to spine. PMSA PET scan showed partial response. Previously on Xtandi 160mg  daily, switched to Darolutamide 600mg  BID due to cardiology issues/drug interactions.  Labs are reviewed and discussed with patient.  Continue Darolutamide 600mg  BID Fatigue/brain fog could be due to CNS side effects. Manageable.   Androgen deprivation therapy On Eligard 45mg  Q6 months. Proceed today, Next dose due June 2025  History of deep vein thrombosis (DVT) of lower extremity Provoked, continue Aspirin 162 mg daily for prophylaxis.  Chronic incomplete quadriplegia (HCC) Follow up with neurology Dr.Vaslow, repeat MRI cervical spine results were reviewed Continue physical therapy.  Neurogenic bladder Continue follow up with urology   Orders Placed This Encounter  Procedures   CBC with Differential (Cancer Center Only)    Standing Status:   Future    Expected Date:   05/07/2023    Expiration Date:   02/06/2024   CMP (Cancer Center only)    Standing Status:   Future    Expected Date:   05/07/2023    Expiration Date:   02/06/2024   PSA    Standing Status:   Future    Expected Date:   05/07/2023    Expiration Date:   02/06/2024   Follow up 3 months.   All questions were answered. The patient knows to call the clinic with any problems, questions or concerns.  [name], MD, PhD [facility] Hematology Oncology 02/06/2023        HISTORY OF PRESENTING ILLNESS:  Jason [name] 67 y.o. male presents to establish care for metastatic prostate cancer I have reviewed his chart and materials related to his cancer extensively and collaborated history with the patient. 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  Associated epidural disease greater at the C6 where there is marked canal stenosis without cord compression.  Left greater than right foraminal effacement due to the extraosseous disease.   10/03/2021 Imaging   MRI left shoulder showed High-riding humeral head with massive full-thickness tear of the entire supraspinatus tendon and the anterior 50% of the infraspinatus tendon. Additional partial-thickness articular sided tearing of the posterior infraspinatus. Moderate to high-grade supraspinatus and infraspinatus muscle atrophy suggests these tears  are chronic. Partial-thickness tearing of the superior greater than inferior  aspects of the subscapularis tendon footprint. Mild subscapularis muscle atrophy.  Moderate degenerative changes of the acromioclavicular joint. Moderate glenohumeral cartilage degenerative changes.   10/05/2021 Imaging   CT chest abdomen pelvis with contrast showed  Moderate severity retroperitoneal and pelvic lymphadenopathy,consistent with metastatic disease. Small lytic areas at the levels of L4 and S1, with diffusely sclerotic changes involving the second and fourth right ribs, sacrum and left iliac bone. 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  Sigmoid diverticulosis. Aortic atherosclerosis   10/05/2021 Imaging   CT cervical spine without contrast Extensive heterogeneous sclerotic lesions throughout the cervical spine compatible with metastatic disease to the bone.  Mixed sclerotic and lytic changes in the posterior elements at C5 and C6.Extraosseous tumor at C5 and C6 is better appreciated on the MRI scan.   10/05/2021 Tumor Marker   PSA >1500   10/06/2021 Cancer Staging   Staging form: Prostate, AJCC 8th Edition - Clinical stage from 10/06/2021: Stage IVB (cN1, pM1c, PSA: 1500) - Signed by [name], MD on 12/04/2021 Stage prefix: Initial diagnosis Prostate specific antigen (PSA) range: 20 or greater   10/08/2021 Imaging   MRI thoracic spine and the lumbar spine with and without contrast 1. 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 2. 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 3. Additional epidural tumor along the posterior endplates at T9,T11, and T12 without high-grade spinal canal stenosis or cord compression. 4. Extensive epidural tumor in the lumbar spine is greatest in thickness at L1 without high-grade spinal canal stenosis at this level; however, superimposed on pre-existing degenerative changes lower in the lumbar spine results in severe spinal canal stenosis with cauda equina nerve root compression at L3-L4 and L4-L5. Multilevel neural foraminal stenosis is detailed above. 5. Postsurgical changes reflecting C6 laminectomy unchanged spinal canal stenosis compared to the postoperative study from 1 day prior.No definite cord signal abnormality. 6. Extramedullary fluid collection along the dorsal aspect of the cord beginning at T9-T10 extending into the lumbar spine is likely subdural in location but is of  uncertain etiology; evolving blood products not excluded. The collection anteriorly displaces the cord and cauda equina nerve roots without frank cord compression or signal abnormality. There is no peripheral enhancement to suggest abscess. 7. Prevertebral edema in the cervical spine which may be postsurgical in nature. 8. New bilateral lower lobe consolidations may reflect atelectasis or developing pneumonia/aspiration.   10/08/2021 Imaging   MRI brain with and without contrast showed 1. 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 There is intracranial extension of tumor along the right frontal convexity measuring up to 5 mm in thickness without mass effect on the underlying brain parenchyma or midline shift. Extracranial extension of tumor in this location measures up to approximately 3 mm. 2. Additional extracranial extension of tumor into the scalp along the vertex measuring up to 7 mm in thickness. 3. Small focus of diffusion restriction in the right parietal cortex most in keeping with a small acute infarct. 4. No evidence of parenchymal metastatic disease.   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 10/06/2021, patient underwent C6 cervical laminectomy and resection of tumor.  Pathology came back positive for metastatic prostate cancer. 8/14 developed significant bilateral upper and lower extremity weakness with hypotension requiring transfer to the ICU, pressors, steroid, respiratory failure on mechanical ventilation. Later patient was stabilized and extubated.  8/16 Firmagon loading dose 240mg   9/15 Eligard 22.5mg  given inpatient during his rehab admission.     10/2021 -  Radiation Therapy   Palliative radiation to spine.    12/04/2021 Tumor Marker   PSA 67.09   01/13/2022  Tumor Marker   PSA 43.95   01/23/2022 -  Chemotherapy    Started on Xtandi 160mg  daily.     INTERVAL HISTORY Jason [name] is a 67 y.o. male who has above history reviewed by me today presents for follow up visit for metastatic prostate cancer. Patient was accompanied by wife.  Patient has chronic bilateral lower extremity extremity weakness, generalized left upper extremity weakness.  Marland Kitchen  He follows up with neurology. + fatigued, brain fog, fatigue, unchanged.  No new complaints. He continues physical therapy and has made some progress.   MEDICAL HISTORY:  Past Medical History:  Diagnosis Date   Allergy    Arthritis    osteoarthritis   Cancer (HCC)    prostate   CHF (congestive heart failure) (HCC)    GERD (gastroesophageal reflux disease)    History of kidney stones    passed   Hypertension    Hypothyroidism    Palpitations 2022   Paralysis (HCC)    paraplegia 09/2021-surgery or stroke   Pneumonia    12/22/28- > 25 years ago   Stroke [facility])    Mini stroke - Aug 2023   Thyroid disease    Hyperthyroidism s/p radioactive iodine ablation    SURGICAL HISTORY: Past Surgical History:  Procedure Laterality Date   CARDIAC CATHETERIZATION  03/2000   HERNIA REPAIR  1961   umbilical   IR CATHETER TUBE CHANGE  12/12/2022   IR US GUIDE BX ASP/DRAIN  11/11/2022   LEFT HEART CATH AND CORONARY ANGIOGRAPHY Left 03/20/2020   Procedure: LEFT HEART CATH AND CORONARY ANGIOGRAPHY;  Surgeon: Yvonne Kendall, MD;  Location: [facility] INVASIVE CV LAB;  Service: Cardiovascular;  Laterality: Left;   POSTERIOR CERVICAL FUSION/FORAMINOTOMY N/A 10/06/2021   Procedure: POSTERIOR CERVICAL FUSION/ FORAMINOTOMY LEVEL 3;  Surgeon: Venetia Night, MD;  Location: [facility] ORS;  Service: Neurosurgery;  Laterality: N/A;  will need monitoring   RADIOLOGY WITH ANESTHESIA Left 04/18/2021   Procedure: MRI SHOULDER WITHOUT CONTRAST WITH ANESTHESIA;  Surgeon: Radiologist, Medication, MD;  Location: [facility] OR;  Service: Radiology;  Laterality: Left;   RADIOLOGY WITH  ANESTHESIA N/A 10/03/2021   Procedure: MRI CERVICAL SPINE WITH ANESTHESIA;  Surgeon: Radiologist, Medication, MD;  Location: [facility] OR;  Service: Radiology;  Laterality: N/A;   RADIOLOGY WITH ANESTHESIA N/A 01/30/2022   Procedure: MRI CERVICAL SPINE WITH AND WITOHOUT CONTRAST WITH ANESTHESIA;  Surgeon: Radiologist, Medication, MD;  Location: [facility] OR;  Service: Radiology;  Laterality: N/A;   RADIOLOGY WITH ANESTHESIA N/A 05/22/2022   Procedure: MRI LUMBER SPINE AND CERVIC SPINE WITH AND WITHOUT CONTRAST;  Surgeon: Radiologist, Medication, MD;  Location: [facility] OR;  Service: Radiology;  Laterality: N/A;   RADIOLOGY WITH ANESTHESIA N/A 12/25/2022   Procedure: MRI WITH ANESTHESIA OF CERVICAL SPINE WITH AND WITHOUT CONTRAST,CARDIAC MORPHOLOGY;  Surgeon: Radiologist, Medication, MD;  Location: [facility] OR;  Service: Radiology;  Laterality: N/A;   REVERSE SHOULDER ARTHROPLASTY Left    ROTATOR CUFF REPAIR  11/2010   Dr Hyacinth Meeker   SUPRAPUBIC CATHETER INSERTION  11/11/2022   TONSILLECTOMY     TOTAL HIP ARTHROPLASTY Right 02/24/2009   TOTAL HIP ARTHROPLASTY Left 06/2016   Dr Odis Luster    SOCIAL HISTORY: Social History   Socioeconomic History   Marital status: Married    Spouse name: [name]    Number of children: 0   Years of education: Not on file   Highest education level: Bachelor's degree (e.g., BA, AB, BS)  Occupational History   Occupation: Warden/ranger:  [employer]  Tobacco Use   Smoking status: Never    Passive exposure: Never   Smokeless tobacco: Never  Vaping Use   Vaping status: Never Used  Substance and Sexual Activity   Alcohol use: Not Currently   Drug use: No   Sexual activity: Not Currently  Other Topics Concern   Not on file  Social History Narrative   Lives at home with spouse.    Social Drivers of Corporate investment banker Strain: Low Risk  (01/27/2023)   Overall Financial Resource Strain (CARDIA)    Difficulty of Paying Living Expenses: Not hard at all  Food  Insecurity: No Food Insecurity (01/27/2023)   Hunger Vital Sign    Worried About Running Out of Food in the Last Year: Never true    Ran Out of Food in the Last Year: Never true  Transportation Needs: No Transportation Needs (01/27/2023)   PRAPARE - Administrator, Civil Service (Medical): No    Lack of Transportation (Non-Medical): No  Physical Activity: Unknown (01/27/2023)   Exercise Vital Sign    Days of Exercise per Week: 0 days    Minutes of Exercise per Session: Not on file  Stress: No Stress Concern Present (01/27/2023)   [employer] of Occupational Health - Occupational Stress Questionnaire    Feeling of Stress : Only a little  Social Connections: Moderately Integrated (01/27/2023)   Social Connection and Isolation Panel [NHANES]    Frequency of Communication with Friends and Family: Three times a week    Frequency of Social Gatherings with Friends and Family: More than three times a week    Attends Religious Services: Never    Database administrator or Organizations: No    Attends Engineer, structural: 1 to 4 times per year    Marital Status: Married  Catering manager Violence: Not on file    FAMILY HISTORY: Family History  Problem Relation Age of Onset   Hypertension Mother    Stroke Mother    Gout Father    Heart attack Father 62   Heart disease Sister    Lung cancer Sister    Drug abuse Sister    Breast cancer Paternal Aunt    Lung cancer Maternal Grandmother    Colon cancer Neg Hx    Esophageal cancer Neg Hx    Rectal cancer Neg Hx    Stomach cancer Neg Hx     ALLERGIES:  has no known allergies.  MEDICATIONS:  Current Outpatient Medications  Medication Sig Dispense Refill   acetaminophen (TYLENOL) 500 MG tablet Take 1,000 mg by mouth 2 (two) times daily.     ascorbic acid (VITAMIN C) 500 MG tablet Take 1 tablet (500 mg total) by mouth daily. 30 tablet 0   aspirin EC 81 MG tablet Take 1 tablet (81 mg total) by mouth daily. Swallow  whole. 30 tablet 12   atorvastatin (LIPITOR) 40 MG tablet Take 1 tablet (40 mg total) by mouth daily. 90 tablet 3   cetirizine (ZYRTEC) 10 MG tablet Take 10 mg by mouth at bedtime.     clonazePAM (KLONOPIN) 0.5 MG tablet Take 1 tablet (0.5 mg total) by mouth 2 (two) times daily. 180 tablet 0   CRANBERRY PO Take 1 tablet by mouth 2 (two) times daily.     famotidine (PEPCID) 20 MG tablet Take 1 tablet (20 mg total) by mouth daily. 30 tablet 0   FLUoxetine (PROZAC) 20 MG capsule Take 1 capsule (20  mg total) by mouth at bedtime. 90 capsule 3   fluticasone (FLONASE) 50 MCG/ACT nasal spray Place 1 spray into both nostrils daily. 48 g 3   guaiFENesin (MUCINEX) 600 MG 12 hr tablet Take 600 mg by mouth 2 (two) times daily.     levothyroxine (SYNTHROID) 88 MCG tablet Take 1 tablet (88 mcg total) by mouth daily. 90 tablet 3   Multiple Vitamin (MULTIVITAMIN) tablet Take 1 tablet by mouth daily.     potassium chloride SA (KLOR-CON M) 20 MEQ tablet Take 1 tablet (20 mEq total) by mouth at bedtime. 90 tablet 3   sacubitril-valsartan (ENTRESTO) 24-26 MG Take 1 tablet by mouth 2 (two) times daily. 28 tablet 0   sennosides-docusate sodium (SENOKOT-S) 8.6-50 MG tablet Take 2 tablets by mouth daily. 60 tablet 11   tamsulosin (FLOMAX) 0.4 MG CAPS capsule Take 1 capsule (0.4 mg total) by mouth daily. 90 capsule 3   amoxicillin (AMOXIL) 500 MG capsule Take 2,000 mg by mouth See admin instructions. Take 2000 mg 1 hour prior to dental work (Patient not taking: Reported on 02/06/2023)     darolutamide (NUBEQA) 300 MG tablet Take 2 tablets (600 mg total) by mouth 2 (two) times daily with a meal. 120 tablet 2   empagliflozin (JARDIANCE) 10 MG TABS tablet Take 1 tablet (10 mg total) by mouth daily before breakfast. 90 tablet 3   metoprolol succinate (TOPROL-XL) 25 MG 24 hr tablet Take 1 tablet (25 mg total) by mouth at bedtime. Take with or immediately following a meal. 90 tablet 3   No current facility-administered  medications for this visit.    Review of Systems  Constitutional:  Positive for fatigue. Negative for appetite change and chills.  HENT:   Negative for hearing loss.   Eyes:  Negative for icterus.  Respiratory:  Negative for cough and shortness of breath.   Cardiovascular:  Negative for chest pain.  Gastrointestinal:  Negative for abdominal pain and blood in stool.  Genitourinary:         + Foley catheter  Musculoskeletal:  Negative for back pain.  Skin:  Negative for rash.  Neurological:  Positive for extremity weakness.     PHYSICAL EXAMINATION: ECOG PERFORMANCE STATUS: 2 - Symptomatic, <50% confined to bed  Vitals:   02/06/23 1054  BP: 115/67  Pulse: (!) 53  Resp: 18  SpO2: 100%   There were no vitals filed for this visit.     Physical Exam Constitutional:      General: He is not in acute distress.    Appearance: He is not diaphoretic.     Comments: Patient sits in wheelchair.  HENT:     Head: Normocephalic and atraumatic.     Nose: Nose normal.     Mouth/Throat:     Pharynx: No oropharyngeal exudate.  Eyes:     General: No scleral icterus. Cardiovascular:     Rate and Rhythm: Normal rate.  Pulmonary:     Effort: Pulmonary effort is normal. No respiratory distress.  Abdominal:     General: There is no distension.  Musculoskeletal:        General: Normal range of motion.     Cervical back: Normal range of motion.  Skin:    General: Skin is warm and dry.     Findings: No erythema.  Neurological:     Mental Status: He is alert and oriented to person, place, and time.     Cranial Nerves: No cranial nerve deficit.  Motor: No abnormal muscle tone.     Comments: Quadriplegia  Psychiatric:        Mood and Affect: Mood and affect normal.      LABORATORY DATA:  I have reviewed the data as listed    Latest Ref Rng & Units 02/06/2023   10:43 AM 12/25/2022    9:01 AM 11/11/2022    9:38 AM  CBC  WBC 4.0 - 10.5 K/uL 4.3  6.4  6.3   Hemoglobin 13.0 -  17.0 g/dL 16.1  09.6  04.5   Hematocrit 39.0 - 52.0 % 37.4  41.4  42.5   Platelets 150 - 400 K/uL 261  257  287       Latest Ref Rng & Units 02/06/2023   10:43 AM 12/12/2022    2:32 PM 11/20/2022   11:56 AM  CMP  Glucose 70 - 99 mg/dL 88  409  91   BUN 8 - 23 mg/dL 9  12  11    Creatinine 0.61 - 1.24 mg/dL 8.11  9.14  7.82   Sodium 135 - 145 mmol/L 138  141  135   Potassium 3.5 - 5.1 mmol/L 4.4  4.0  3.2   Chloride 98 - 111 mmol/L 102  99  97   CO2 22 - 32 mmol/L 28  28  27    Calcium 8.9 - 10.3 mg/dL 9.0  9.7  8.5   Total Protein 6.5 - 8.1 g/dL 6.2     Total Bilirubin <1.2 mg/dL 0.9     Alkaline Phos 38 - 126 U/L 91     AST 15 - 41 U/L 23     ALT 0 - 44 U/L 22        RADIOGRAPHIC STUDIES: I have personally reviewed the radiological images as listed and agreed with the findings in the report. No results found.

## 2023-02-06 NOTE — Patient Instructions (Signed)
Medication Instructions:  Your physician recommends the following medication changes.  DECREASE: Metoprolol to 25 mg by mouth daily  *If you need a refill on your cardiac medications before your next appointment, please call your pharmacy*   Lab Work: No labs ordered today    Testing/Procedures: No test ordered today    Follow-Up: At [facility], you and your health needs are our priority.  As part of our continuing mission to provide you with exceptional heart care, we have created designated Provider Care Teams.  These Care Teams include your primary Cardiologist (physician) and Advanced Practice Providers (APPs -  Physician Assistants and Nurse Practitioners) who all work together to provide you with the care you need, when you need it.  We recommend signing up for the patient portal called "MyChart".  Sign up information is provided on this After Visit Summary.  MyChart is used to connect with patients for Virtual Visits (Telemedicine).  Patients are able to view lab/test results, encounter notes, upcoming appointments, etc.  Non-urgent messages can be sent to your provider as well.   To learn more about what you can do with MyChart, go to [email or web address].    Your next appointment:   3 month(s)  Provider:   You may see Yvonne Kendall, MD or one of the following Advanced Practice Providers on your designated Care Team:   Nicolasa Ducking, NP Eula Listen, PA-C Cadence Fransico Michael, PA-C Charlsie Quest, NP Carlos Levering, NP

## 2023-02-06 NOTE — Assessment & Plan Note (Signed)
Provoked, continue Aspirin 162 mg daily for prophylaxis.

## 2023-02-06 NOTE — Assessment & Plan Note (Addendum)
On Eligard 45mg  Q6 months. Proceed today, Next dose due June 2025

## 2023-02-06 NOTE — Assessment & Plan Note (Signed)
Follow up with neurology Dr.Vaslow, repeat MRI cervical spine results were reviewed Continue physical therapy.

## 2023-02-06 NOTE — Assessment & Plan Note (Addendum)
Metastatic castration sensitive prostate cancer, with CNS involvement. Currently on chemoprevention therapy with Eligard.  Status post palliative radiation to spine. PMSA PET scan showed partial response. Previously on Xtandi 160mg  daily, switched to Darolutamide 600mg  BID due to cardiology issues/drug interactions.  Labs are reviewed and discussed with patient.  Continue Darolutamide 600mg  BID Fatigue/brain fog could be due to CNS side effects. Manageable.

## 2023-02-06 NOTE — Progress Notes (Signed)
Cardiology Office Note:    Date:  02/06/2023   ID:  KYM DRAWDY, DOB Jun 29, 1955, MRN [id number]  PCP:  Karie Schwalbe, MD  [facility] Cardiologist:  Yvonne Kendall, MD  [facility] Electrophysiologist:  Lanier Prude, MD   Referring MD: Karie Schwalbe, MD   Chief Complaint: 3 month follow-up  History of Present Illness:    Jason [name] is a 67 y.o. male with a hx of metastatic prostate cancer, h/o stroke, HTN, thyroid disease, GERD, HFrEF, NICM, osteoarthritis, and PVCs who presents for 3 month follow-up.   The patient underwent remote stress test in 2002 which led to cardiac cath that showed no significant CAD.    The patient was evaluated for palpitations in 2022. He underwent cardiac cath 02/2020 that showed no significant CAD, normal LV function with mildly elevated filling pressures consistent with diastolic dysfunction. He was started on metorpolol for PVCs.    Seen in the office 03/30/20 reporting palpitations. A heart monitor was ordered that showed SR, rare PVC/PACs, NSVT, PSVT. He was placed on Toprol. Echo 06/2020 showed LVEF 50-55%, no WMA, mild LVH, G1DD, RVSF, normal RV size and function.   Patient was seen in June 2024 reporting low heart rate into the 30s and 40s.  EKG showed normal sinus rhythm, heart rate 60 to 70s with frequent PVCs.  A 2-week heart monitor was ordered and labs were drawn.  Heart monitor showed predominantly normal sinus rhythm with an average heart rate of 50, rare PACs and frequent PVCs 15.8% burden, 6 episodes of nonsustained VT, 11 runs of SVT, lasting up to 14 seconds. Echo showed LVEF 25-30%, G2DD, mild to mod MR. The patient was referred to EP, who recommended cMRI. Lisinopril was stopped and he was started on Entresto. cMRI showed LVEF 43%, high risk scar, PFO noted, and mild aortic dilation.   Today, BP is low. Yesterday it was 115/67. Bp low at random times. The patient denies symptoms. He doesn't feel dizzy or  lightheaded. He is not taking Jardiance, they have not heard back regarding the patient assistance. He takes toprol at night.He denies chest pain, SOB, lower leg edema. He had suprapubic catheter installed.   Past Medical History:  Diagnosis Date   Allergy    Arthritis    osteoarthritis   Cancer (HCC)    prostate   CHF (congestive heart failure) (HCC)    GERD (gastroesophageal reflux disease)    History of kidney stones    passed   Hypertension    Hypothyroidism    Palpitations 2022   Paralysis (HCC)    paraplegia 09/2021-surgery or stroke   Pneumonia    12/22/28- > 25 years ago   Stroke [facility])    Mini stroke - Aug 2023   Thyroid disease    Hyperthyroidism s/p radioactive iodine ablation    Past Surgical History:  Procedure Laterality Date   CARDIAC CATHETERIZATION  03/2000   HERNIA REPAIR  1961   umbilical   IR CATHETER TUBE CHANGE  12/12/2022   IR US GUIDE BX ASP/DRAIN  11/11/2022   LEFT HEART CATH AND CORONARY ANGIOGRAPHY Left 03/20/2020   Procedure: LEFT HEART CATH AND CORONARY ANGIOGRAPHY;  Surgeon: Yvonne Kendall, MD;  Location: [facility] INVASIVE CV LAB;  Service: Cardiovascular;  Laterality: Left;   POSTERIOR CERVICAL FUSION/FORAMINOTOMY N/A 10/06/2021   Procedure: POSTERIOR CERVICAL FUSION/ FORAMINOTOMY LEVEL 3;  Surgeon: Venetia Night, MD;  Location: [facility] ORS;  Service: Neurosurgery;  Laterality: N/A;  will need monitoring  RADIOLOGY WITH ANESTHESIA Left 04/18/2021   Procedure: MRI SHOULDER WITHOUT CONTRAST WITH ANESTHESIA;  Surgeon: Radiologist, Medication, MD;  Location: [facility] OR;  Service: Radiology;  Laterality: Left;   RADIOLOGY WITH ANESTHESIA N/A 10/03/2021   Procedure: MRI CERVICAL SPINE WITH ANESTHESIA;  Surgeon: Radiologist, Medication, MD;  Location: [facility] OR;  Service: Radiology;  Laterality: N/A;   RADIOLOGY WITH ANESTHESIA N/A 01/30/2022   Procedure: MRI CERVICAL SPINE WITH AND WITOHOUT CONTRAST WITH ANESTHESIA;  Surgeon: Radiologist, Medication, MD;   Location: [facility] OR;  Service: Radiology;  Laterality: N/A;   RADIOLOGY WITH ANESTHESIA N/A 05/22/2022   Procedure: MRI LUMBER SPINE AND CERVIC SPINE WITH AND WITHOUT CONTRAST;  Surgeon: Radiologist, Medication, MD;  Location: [facility] OR;  Service: Radiology;  Laterality: N/A;   RADIOLOGY WITH ANESTHESIA N/A 12/25/2022   Procedure: MRI WITH ANESTHESIA OF CERVICAL SPINE WITH AND WITHOUT CONTRAST,CARDIAC MORPHOLOGY;  Surgeon: Radiologist, Medication, MD;  Location: [facility] OR;  Service: Radiology;  Laterality: N/A;   REVERSE SHOULDER ARTHROPLASTY Left    ROTATOR CUFF REPAIR  11/2010   Dr Hyacinth Meeker   SUPRAPUBIC CATHETER INSERTION  11/11/2022   TONSILLECTOMY     TOTAL HIP ARTHROPLASTY Right 02/24/2009   TOTAL HIP ARTHROPLASTY Left 06/2016   Dr Odis Luster    Current Medications: Current Meds  Medication Sig   acetaminophen (TYLENOL) 500 MG tablet Take 1,000 mg by mouth 2 (two) times daily.   ascorbic acid (VITAMIN C) 500 MG tablet Take 1 tablet (500 mg total) by mouth daily.   aspirin EC 81 MG tablet Take 1 tablet (81 mg total) by mouth daily. Swallow whole.   atorvastatin (LIPITOR) 40 MG tablet Take 1 tablet (40 mg total) by mouth daily.   cetirizine (ZYRTEC) 10 MG tablet Take 10 mg by mouth at bedtime.   clonazePAM (KLONOPIN) 0.5 MG tablet Take 1 tablet (0.5 mg total) by mouth 2 (two) times daily.   CRANBERRY PO Take 1 tablet by mouth 2 (two) times daily.   darolutamide (NUBEQA) 300 MG tablet Take 2 tablets (600 mg total) by mouth 2 (two) times daily with a meal.   empagliflozin (JARDIANCE) 10 MG TABS tablet Take 1 tablet (10 mg total) by mouth daily before breakfast.   famotidine (PEPCID) 20 MG tablet Take 1 tablet (20 mg total) by mouth daily.   FLUoxetine (PROZAC) 20 MG capsule Take 1 capsule (20 mg total) by mouth at bedtime.   fluticasone (FLONASE) 50 MCG/ACT nasal spray Place 1 spray into both nostrils daily.   guaiFENesin (MUCINEX) 600 MG 12 hr tablet Take 600 mg by mouth 2 (two) times daily.    levothyroxine (SYNTHROID) 88 MCG tablet Take 1 tablet (88 mcg total) by mouth daily.   Multiple Vitamin (MULTIVITAMIN) tablet Take 1 tablet by mouth daily.   potassium chloride SA (KLOR-CON M) 20 MEQ tablet Take 1 tablet (20 mEq total) by mouth at bedtime.   sacubitril-valsartan (ENTRESTO) 24-26 MG Take 1 tablet by mouth 2 (two) times daily.   sennosides-docusate sodium (SENOKOT-S) 8.6-50 MG tablet Take 2 tablets by mouth daily.   tamsulosin (FLOMAX) 0.4 MG CAPS capsule Take 1 capsule (0.4 mg total) by mouth daily.   [DISCONTINUED] metoprolol succinate (TOPROL-XL) 50 MG 24 hr tablet Take 1 tablet (50 mg total) by mouth at bedtime. Take with or immediately following a meal.     Allergies:   Patient has no known allergies.   Social History   Socioeconomic History   Marital status: Married    Spouse name: Delray Alt  Number of children: 0   Years of education: Not on file   Highest education level: Bachelor's degree (e.g., BA, AB, BS)  Occupational History   Occupation: Warden/ranger: [employer]  Tobacco Use   Smoking status: Never    Passive exposure: Never   Smokeless tobacco: Never  Vaping Use   Vaping status: Never Used  Substance and Sexual Activity   Alcohol use: Not Currently   Drug use: No   Sexual activity: Not Currently  Other Topics Concern   Not on file  Social History Narrative   Lives at home with spouse.    Social Drivers of Corporate investment banker Strain: Low Risk  (01/27/2023)   Overall Financial Resource Strain (CARDIA)    Difficulty of Paying Living Expenses: Not hard at all  Food Insecurity: No Food Insecurity (01/27/2023)   Hunger Vital Sign    Worried About Running Out of Food in the Last Year: Never true    Ran Out of Food in the Last Year: Never true  Transportation Needs: No Transportation Needs (01/27/2023)   PRAPARE - Administrator, Civil Service (Medical): No    Lack of Transportation (Non-Medical): No  Physical  Activity: Unknown (01/27/2023)   Exercise Vital Sign    Days of Exercise per Week: 0 days    Minutes of Exercise per Session: Not on file  Stress: No Stress Concern Present (01/27/2023)   [employer] of Occupational Health - Occupational Stress Questionnaire    Feeling of Stress : Only a little  Social Connections: Moderately Integrated (01/27/2023)   Social Connection and Isolation Panel [NHANES]    Frequency of Communication with Friends and Family: Three times a week    Frequency of Social Gatherings with Friends and Family: More than three times a week    Attends Religious Services: Never    Database administrator or Organizations: No    Attends Engineer, structural: 1 to 4 times per year    Marital Status: Married     Family History: The patient's family history includes Breast cancer in his paternal aunt; Drug abuse in his sister; Gout in his father; Heart attack (age of onset: 60) in his father; Heart disease in his sister; Hypertension in his mother; Lung cancer in his maternal grandmother and sister; Stroke in his mother. There is no history of Colon cancer, Esophageal cancer, Rectal cancer, or Stomach cancer.  ROS:   Please see the history of present illness.     All other systems reviewed and are negative.  EKGs/Labs/Other Studies Reviewed:    The following studies were reviewed today:  cMRI 11/2022  IMPRESSION: 1.  Mild decrease in LVEF, 43%.   2. Mild increase in ECV signal no evidence of LGE suggestive of high risk scar.   3.  PFO noted.   4. Mild aortic dilation, 40 mm. Consider repeat cardiac imaging in one year.   Riley Lam MD     Electronically Signed   By: Riley Lam M.D.   On: 12/25/2022 13:39  Echo limited 09/2022 1. Left ventricular ejection fraction, by estimation, is 25 to 30%. The  left ventricle has severely decreased function. The left ventricle  demonstrates global hypokinesis. The left ventricular  internal cavity size  was moderately dilated. Left  ventricular diastolic parameters are consistent with Grade II diastolic  dysfunction (pseudonormalization). The average left ventricular global  longitudinal strain is -10.8 %. The global longitudinal strain  is  abnormal.   2. Right ventricular systolic function is normal. The right ventricular  size is normal.   3. The mitral valve is normal in structure. Mild to moderate mitral valve  regurgitation. No evidence of mitral stenosis.   4. The aortic valve is normal in structure. Aortic valve regurgitation is  mild. No aortic stenosis is present.   5. The inferior vena cava is normal in size with greater than 50%  respiratory variability, suggesting right atrial pressure of 3 mmHg.   Heart monitor 08/2022    The patient was monitored for 14 days; only 9 days, 20 hours were suitable for analysis.   The predominant rhythm was sinus with an average rate of 50 bpm in sinus (range 39-81 bpm).   Rare PAC's and frequent PVC's were observed (PVC burden 15.8%).   6 episodes of nonsustained ventricular tachycardia occurred, lasting up to 7 beats with a maximum rate of 139 bpm.   11 supraventricular runs were noted, lasting up to 14.0 seconds with a maximum rate of 126 bpm.   No sustained arrhythmia or prolonged pause was seen.   There were no patient triggered events.   Predominantly sinus rhythm with frequent PVC's (15.8% burden) and several episodes of NSVT and PSVT, as detailed above.  EKG:  EKG is not ordered today.  T  Recent Labs: 08/08/2022: Magnesium 2.2 09/19/2022: TSH 5.220 02/06/2023: ALT 22; BUN 9; Creatinine 0.34; Hemoglobin 12.4; Platelet Count 261; Potassium 4.4; Sodium 138  Recent Lipid Panel    Component Value Date/Time   CHOL 123 09/19/2022 1458   TRIG 74 09/19/2022 1458   HDL 52 09/19/2022 1458   CHOLHDL 2.4 09/19/2022 1458   CHOLHDL 3.9 07/25/2020 1657   VLDL 15 07/25/2020 1657   LDLCALC 56 09/19/2022 1458   LDLDIRECT  62 10/10/2021 0455    Physical Exam:    VS:  BP 90/60 (BP Location: Left Arm, Patient Position: Sitting, Cuff Size: Normal)   Pulse (!) 58   Ht 5\' 9"  (1.753 m)   Wt 155 lb (70.3 kg)   SpO2 90%   BMI 22.89 kg/m     Wt Readings from Last 3 Encounters:  02/06/23 155 lb (70.3 kg)  12/25/22 179 lb (81.2 kg)  12/12/22 179 lb (81.2 kg)     GEN:  Well nourished, well developed in no acute distress HEENT: Normal NECK: No JVD; No carotid bruits LYMPHATICS: No lymphadenopathy CARDIAC: RRR, no murmurs, rubs, gallops RESPIRATORY:  Clear to auscultation without rales, wheezing or rhonchi  ABDOMEN: Soft, non-tender, non-distended MUSCULOSKELETAL:  No edema; No deformity  SKIN: Warm and dry NEUROLOGIC:  Alert and oriented x 3 PSYCHIATRIC:  Normal affect   ASSESSMENT:    1. PVC's (premature ventricular contractions)   2. Chronic systolic heart failure (HCC)   3. Essential hypertension   4. Dilation of aorta (HCC)   5. PFO (patent foramen ovale)    PLAN:    In order of problems listed above:  PVCs Heart monitor showed 15.8% PVC burden. Echo showed reduced EF 25-30%. He was started on GDMT and EF improved to 43% on cMRI. He is hypotensive today. Decrease Toprol to 25mg  daily.   HFrEF Echo 25-30% in August 2024. LHC in 2022 showed no significant CAD. Suspect CM due to PVCs vs chemotherapy drug. He was started on GDMT And cMRI showed LVEF 43%. He is euvolemic on exam. Decrease Toprol as above for hypotension. Continue Entresto 24-26mg BID. We will check on patient assistance for Jardiance.  HTN Now with hypotension. Plan as above. Continue Entresto.   Aortic dilation  cMRI showed 40mm dilation. Repeat imaging in a year.  PFO Noted on cMRI. Continue Aspirin 81mg  daily. Prior echocardiograms have not shown any shunting.   Disposition: Follow up in 3 month(s) with MD/APP    Signed, Pasco Marchitto David Stall, PA-C  02/06/2023 1:57 PM    [facility]

## 2023-02-06 NOTE — Progress Notes (Signed)
Some coughing, cold hands, no pain,

## 2023-02-09 ENCOUNTER — Ambulatory Visit (INDEPENDENT_AMBULATORY_CARE_PROVIDER_SITE_OTHER): Payer: Medicare Other | Admitting: Physician Assistant

## 2023-02-09 DIAGNOSIS — R339 Retention of urine, unspecified: Secondary | ICD-10-CM

## 2023-02-09 DIAGNOSIS — N319 Neuromuscular dysfunction of bladder, unspecified: Secondary | ICD-10-CM

## 2023-02-09 NOTE — Progress Notes (Signed)
Suprapubic Cath Change  Patient is present today for a suprapubic catheter change due to urinary retention.  7ml of water was drained from the balloon, a 16FR Council tip foley cath was removed from the tract without difficulty.  Site was cleaned and prepped in a sterile fashion with betadine.  A 16FR foley cath was replaced into the tract no complications were noted. Urine return was noted, 10 ml of sterile water was inflated into the balloon and a night bag was attached for drainage.  Patient tolerated well.     Performed by: Carman Ching, PA-C and Ples Specter, CMA  Additional notes: We discussed the need for monthly SPT changes, either in clinic or with home health per their preference. Will plan with [place] pending their conversations with home health.  Follow up: Return in about 4 weeks (around 03/09/2023) for SPT exchange.

## 2023-02-12 ENCOUNTER — Telehealth: Payer: Self-pay

## 2023-02-12 MED ORDER — AZITHROMYCIN 250 MG PO TABS: ORAL_TABLET | ORAL | 0 refills | Status: AC

## 2023-02-12 NOTE — Telephone Encounter (Signed)
Alisa with contact center did transfer to me from [facility]; for few days pt has congestion in chest; prod cough with thick green mucus. Pt has some wheezing that is worse on lt side. Congested left lower lobe, prod cough thick green phlegm, SOB no worse than usual and no fever. Dr Alphonsus Sias said if no known allergy to send in Z pak. No known allergies for pt; Z pak to [facility] [address] [place] voiced understanding and will let pt know. UC & ED precautions given and pt voiced understanding. Sending FYI to Dr Alphonsus Sias.

## 2023-02-12 NOTE — Telephone Encounter (Signed)
Reasonable to try empiric Rx---but will need ER evaluation if SOB, etc

## 2023-02-24 ENCOUNTER — Encounter: Payer: Self-pay | Admitting: Oncology

## 2023-02-26 ENCOUNTER — Encounter: Payer: Self-pay | Admitting: Oncology

## 2023-02-26 ENCOUNTER — Ambulatory Visit (INDEPENDENT_AMBULATORY_CARE_PROVIDER_SITE_OTHER): Payer: Medicare Other | Admitting: Urology

## 2023-02-26 VITALS — BP 111/66 | HR 56

## 2023-02-26 DIAGNOSIS — C61 Malignant neoplasm of prostate: Secondary | ICD-10-CM | POA: Diagnosis not present

## 2023-02-26 DIAGNOSIS — N319 Neuromuscular dysfunction of bladder, unspecified: Secondary | ICD-10-CM

## 2023-02-26 NOTE — Progress Notes (Signed)
   02/26/2023 10:01 AM   Jason [name] Jason [name] [birth year] [id number]  Reason for visit: Follow up metastatic prostate cancer, urinary retention  HPI: Complex 68 year old male with prolonged hospitalization and rehab stay August through October of 2023.  Briefly, presented with extremity weakness and was found to have PSA >1500, underwent C6 cervical laminectomy and resection of tumor on 10/06/2021 showing metastatic prostate cancer, 10/07/21 developed significant bilateral upper and lower extremity weakness with hypotension requiring transfer to the ICU, pressors, steroids, and respiratory failure requiring adequate ventilation.  Firmagon  loading dose was given on 10/09/21, and he has continued on ADT.  He also received palliative radiation to the spine.  He is followed by oncology, and also is on enzalutamide (not a candidate for docetaxel).  He has been in urinary retention since that time with Foley catheter felt to be secondary to neurogenic bladder.  He was referred to urology for bladder management options.  His prostate cancer is managed by oncology(Dr. Babara), and most recent PSA 02/06/2023 3.49 which is increased from prior value of 2.1.  Most of the history is obtained from his wife again today, and I reviewed the oncology notes extensively.  He had an indwelling Foley since his initial hospitalization, but ultimately opted to transition to a suprapubic tube that was placed by IR in August 2024.  It was most recently changed on 02/09/2023 by Sheppard Hones, PA.  He has had 3 UTIs, but none in the last 9 months after starting cranberry tablet prophylaxis.  We discussed consideration of a capping trial of the suprapubic tube, as well as the risks and benefits at length.  They both remain very hesitant to consider capping trial and is tolerating the suprapubic tube well.  He is not having any leakage from below, no UTIs since SP tube placed, and feels it is more comfortable than the urethral Foley.   He remains wheelchair dependent, unable to transfer.  Continue oncology follow-up for treatment of prostate cancer Continue monthly SP tube changes, can transition to home health changes if able They would like to continue SP tube to drainage at this time which is very reasonable RTC with me 9 months symptom check, can consider capping trial at any time if patient and family desire  I spent 25 total minutes on the day of the encounter including pre-visit review of the medical record, face-to-face time with the patient, and post visit ordering of labs/imaging/tests.   Jason JAYSON Burnet, MD  [facility] [address] [place], [place] [address] [phone]

## 2023-03-04 ENCOUNTER — Encounter: Payer: Self-pay | Admitting: Oncology

## 2023-03-12 ENCOUNTER — Ambulatory Visit: Payer: Medicare Other | Admitting: Physician Assistant

## 2023-03-12 VITALS — BP 112/62 | HR 66

## 2023-03-12 DIAGNOSIS — R339 Retention of urine, unspecified: Secondary | ICD-10-CM

## 2023-03-12 DIAGNOSIS — Z435 Encounter for attention to cystostomy: Secondary | ICD-10-CM

## 2023-03-12 NOTE — Progress Notes (Signed)
Suprapubic Cath Change  Patient is present today for a suprapubic catheter change due to urinary retention.  8ml of water was drained from the balloon, a 16FR foley cath was removed from the tract without difficulty.  Site was cleaned and prepped in a sterile fashion with betadine.  A 16FR foley cath was replaced into the tract no complications were noted. Urine return was noted, 10 ml of sterile water was inflated into the balloon and a night bag was attached for drainage.  Patient tolerated well.     Performed by: Carman Ching, PA-C   Follow up: Return in about 4 weeks (around 04/09/2023) for SPT exchange.

## 2023-03-27 ENCOUNTER — Telehealth: Payer: Self-pay

## 2023-03-27 NOTE — Telephone Encounter (Signed)
Verbal orders given to Lisa. 

## 2023-03-27 NOTE — Telephone Encounter (Signed)
Copied from CRM [id number]. Topic: Clinical - Home Health Verbal Orders >> Mar 26, 2023  4:58 PM Corin V wrote: Caller/Agency: [facility] Callback Number: [phone] Service Requested: Occupational Therapy Frequency: 2 times per week for episode of care Any new concerns about the patient? No, he is progressing well.

## 2023-04-01 ENCOUNTER — Encounter: Payer: Self-pay | Admitting: Internal Medicine

## 2023-04-01 ENCOUNTER — Ambulatory Visit (INDEPENDENT_AMBULATORY_CARE_PROVIDER_SITE_OTHER): Payer: Medicare Other | Admitting: Internal Medicine

## 2023-04-01 VITALS — BP 138/82 | HR 56 | Resp 16

## 2023-04-01 DIAGNOSIS — F39 Unspecified mood [affective] disorder: Secondary | ICD-10-CM

## 2023-04-01 DIAGNOSIS — C61 Malignant neoplasm of prostate: Secondary | ICD-10-CM

## 2023-04-01 DIAGNOSIS — K592 Neurogenic bowel, not elsewhere classified: Secondary | ICD-10-CM

## 2023-04-01 DIAGNOSIS — I5020 Unspecified systolic (congestive) heart failure: Secondary | ICD-10-CM | POA: Diagnosis not present

## 2023-04-01 DIAGNOSIS — G8220 Paraplegia, unspecified: Secondary | ICD-10-CM

## 2023-04-01 DIAGNOSIS — C7940 Secondary malignant neoplasm of unspecified part of nervous system: Secondary | ICD-10-CM

## 2023-04-01 NOTE — Progress Notes (Signed)
 Subjective:    Patient ID: Jason [name], male    DOB: 04/07/1955, 68 y.o.   MRN: [id number]  HPI Home visit for follow up of paraplegia and prostate cancer Wife is here as usual  Doing okay No problems with suprapubic catheter---getting it switched every month for now Looking into having the home health nurses do this in the future  Continues with home health PT and OT Once a week for PT--standing at sink, squats in chair for quad strength Wife now able to help stand Still needs Hoyer lift for transfers---wife still uses neighbors for extra hands for this OT twice a week----arm strength, core strength. Brushes teeth and hair, eats himself Needs help getting dressed still   No chest pain No edema--still wears support hose Never put on the jardiance  No SOB Sleeps mostly flat in bed  Continues on the nubeqa  Now on every 6 months lupron   Still has some adjustment issues with disability More accepting of his new normal  Current Outpatient Medications on File Prior to Visit  Medication Sig Dispense Refill   acetaminophen  (TYLENOL ) 500 MG tablet Take 1,000 mg by mouth 2 (two) times daily.     amoxicillin  (AMOXIL ) 500 MG capsule Take 2,000 mg by mouth See admin instructions. Take 2000 mg 1 hour prior to dental work     ascorbic acid  (VITAMIN C ) 500 MG tablet Take 1 tablet (500 mg total) by mouth daily. 30 tablet 0   aspirin  EC 81 MG tablet Take 1 tablet (81 mg total) by mouth daily. Swallow whole. 30 tablet 12   atorvastatin  (LIPITOR) 40 MG tablet Take 1 tablet (40 mg total) by mouth daily. 90 tablet 3   cetirizine (ZYRTEC) 10 MG tablet Take 10 mg by mouth at bedtime.     clonazePAM  (KLONOPIN ) 0.5 MG tablet Take 1 tablet (0.5 mg total) by mouth 2 (two) times daily. 180 tablet 0   CRANBERRY PO Take 1 tablet by mouth 2 (two) times daily.     darolutamide  (NUBEQA ) 300 MG tablet Take 2 tablets (600 mg total) by mouth 2 (two) times daily with a meal. 120 tablet 2   famotidine   (PEPCID ) 20 MG tablet Take 1 tablet (20 mg total) by mouth daily. 30 tablet 0   FLUoxetine  (PROZAC ) 20 MG capsule Take 1 capsule (20 mg total) by mouth at bedtime. 90 capsule 3   fluticasone  (FLONASE ) 50 MCG/ACT nasal spray Place 1 spray into both nostrils daily. 48 g 3   guaiFENesin  (MUCINEX ) 600 MG 12 hr tablet Take 600 mg by mouth 2 (two) times daily.     levothyroxine  (SYNTHROID ) 88 MCG tablet Take 1 tablet (88 mcg total) by mouth daily. 90 tablet 3   metoprolol  succinate (TOPROL -XL) 25 MG 24 hr tablet Take 1 tablet (25 mg total) by mouth at bedtime. Take with or immediately following a meal. 90 tablet 3   Multiple Vitamin (MULTIVITAMIN) tablet Take 1 tablet by mouth daily.     potassium chloride  SA (KLOR-CON  M) 20 MEQ tablet Take 1 tablet (20 mEq total) by mouth at bedtime. 90 tablet 3   sacubitril-valsartan (ENTRESTO ) 24-26 MG Take 1 tablet by mouth 2 (two) times daily. 28 tablet 0   sennosides-docusate sodium  (SENOKOT-S) 8.6-50 MG tablet Take 2 tablets by mouth daily. 60 tablet 11   No current facility-administered medications on file prior to visit.    No Known Allergies  Past Medical History:  Diagnosis Date   Allergy    Arthritis  osteoarthritis   Cancer (HCC)    prostate   CHF (congestive heart failure) (HCC)    GERD (gastroesophageal reflux disease)    History of kidney stones    passed   Hypertension    Hypothyroidism    Palpitations 2022   Paralysis (HCC)    paraplegia 09/2021-surgery or stroke   Pneumonia    12/22/28- > 25 years ago   Stroke [facility])    Mini stroke - Aug 2023   Thyroid  disease    Hyperthyroidism s/p radioactive iodine ablation    Past Surgical History:  Procedure Laterality Date   CARDIAC CATHETERIZATION  03/2000   HERNIA REPAIR  1961   umbilical   IR CATHETER TUBE CHANGE  12/12/2022   IR US  GUIDE BX ASP/DRAIN  11/11/2022   LEFT HEART CATH AND CORONARY ANGIOGRAPHY Left 03/20/2020   Procedure: LEFT HEART CATH AND CORONARY ANGIOGRAPHY;   Surgeon: Mady Bruckner, MD;  Location: [facility]IVE [facility];  Service: Cardiovascular;  Laterality: Left;   POSTERIOR CERVICAL FUSION/FORAMINOTOMY N/A 10/06/2021   Procedure: POSTERIOR CERVICAL FUSION/ FORAMINOTOMY LEVEL 3;  Surgeon: Clois Fret, MD;  Location: [facility] ORS;  Service: Neurosurgery;  Laterality: N/A;  will need monitoring   RADIOLOGY WITH ANESTHESIA Left 04/18/2021   Procedure: MRI SHOULDER WITHOUT CONTRAST WITH ANESTHESIA;  Surgeon: Radiologist, Medication, MD;  Location: [facility] OR;  Service: Radiology;  Laterality: Left;   RADIOLOGY WITH ANESTHESIA N/A 10/03/2021   Procedure: MRI CERVICAL SPINE WITH ANESTHESIA;  Surgeon: Radiologist, Medication, MD;  Location: [facility] OR;  Service: Radiology;  Laterality: N/A;   RADIOLOGY WITH ANESTHESIA N/A 01/30/2022   Procedure: MRI CERVICAL SPINE WITH AND WITOHOUT CONTRAST WITH ANESTHESIA;  Surgeon: Radiologist, Medication, MD;  Location: [facility] OR;  Service: Radiology;  Laterality: N/A;   RADIOLOGY WITH ANESTHESIA N/A 05/22/2022   Procedure: MRI LUMBER SPINE AND CERVIC SPINE WITH AND WITHOUT CONTRAST;  Surgeon: Radiologist, Medication, MD;  Location: [facility] OR;  Service: Radiology;  Laterality: N/A;   RADIOLOGY WITH ANESTHESIA N/A 12/25/2022   Procedure: MRI WITH ANESTHESIA OF CERVICAL SPINE WITH AND WITHOUT CONTRAST,CARDIAC MORPHOLOGY;  Surgeon: Radiologist, Medication, MD;  Location: [facility] OR;  Service: Radiology;  Laterality: N/A;   REVERSE SHOULDER ARTHROPLASTY Left    ROTATOR CUFF REPAIR  11/2010   Dr Cleotilde   SUPRAPUBIC CATHETER INSERTION  11/11/2022   TONSILLECTOMY     TOTAL HIP ARTHROPLASTY Right 02/24/2009   TOTAL HIP ARTHROPLASTY Left 06/2016   Dr Leora    Family History  Problem Relation Age of Onset   Hypertension Mother    Stroke Mother    Gout Father    Heart attack Father 1   Heart disease Sister    Lung cancer Sister    Drug abuse Sister    Breast cancer Paternal Aunt    Lung cancer Maternal Grandmother    Colon cancer Neg  Hx    Esophageal cancer Neg Hx    Rectal cancer Neg Hx    Stomach cancer Neg Hx     Social History   Socioeconomic History   Marital status: Married    Spouse name: [name]    Number of children: 0   Years of education: Not on file   Highest education level: Bachelor's degree (e.g., BA, AB, BS)  Occupational History   Occupation: Warden/ranger: [employer]  Tobacco Use   Smoking status: Never    Passive exposure: Never   Smokeless tobacco: Never  Vaping Use   Vaping status: Never Used  Substance and Sexual Activity   Alcohol use: Not Currently   Drug use: No   Sexual activity: Not Currently  Other Topics Concern   Not on file  Social History Narrative   Lives at home with spouse.    Social Drivers of Corporate Investment Banker Strain: Low Risk  (03/31/2023)   Overall Financial Resource Strain (CARDIA)    Difficulty of Paying Living Expenses: Not hard at all  Food Insecurity: No Food Insecurity (03/31/2023)   Hunger Vital Sign    Worried About Running Out of Food in the Last Year: Never true    Ran Out of Food in the Last Year: Never true  Transportation Needs: No Transportation Needs (03/31/2023)   PRAPARE - Administrator, Civil Service (Medical): No    Lack of Transportation (Non-Medical): No  Physical Activity: Insufficiently Active (03/31/2023)   Exercise Vital Sign    Days of Exercise per Week: 3 days    Minutes of Exercise per Session: 30 min  Stress: No Stress Concern Present (03/31/2023)   [employer] of Occupational Health - Occupational Stress Questionnaire    Feeling of Stress : Only a little  Social Connections: Moderately Integrated (03/31/2023)   Social Connection and Isolation Panel [NHANES]    Frequency of Communication with Friends and Family: Three times a week    Frequency of Social Gatherings with Friends and Family: Three times a week    Attends Religious Services: Never    Active Member of Clubs or Organizations: Yes     Attends Banker Meetings: 1 to 4 times per year    Marital Status: Married  Catering Manager Violence: Not on file   Review of Systems Sleeps fairly well--wife hasn't seen apnea Appetite is good Weight seems stable Still does well with bowel regimen--senna then rectal stimulation with dulcolax (and he goes during sleep) Has slight redness around rectum--using calmiseptine (zinc) Sacral ulcer comes and goes----she uses the zinc on that also No indigestion with evening famotidine      Objective:   Physical Exam Constitutional:      Appearance: Normal appearance.  Cardiovascular:     Rate and Rhythm: Normal rate and regular rhythm.     Heart sounds: No murmur heard.    No gallop.  Pulmonary:     Effort: Pulmonary effort is normal.     Breath sounds: Normal breath sounds. No wheezing or rales.  Abdominal:     Palpations: Abdomen is soft.     Tenderness: There is no abdominal tenderness.  Musculoskeletal:     Cervical back: Neck supple.     Right lower leg: No edema.     Left lower leg: No edema.  Lymphadenopathy:     Cervical: No cervical adenopathy.  Neurological:     Mental Status: He is alert.     Comments: Arms-- 4/5 strength Left leg 3/5, right leg 2/5            Assessment & Plan:

## 2023-04-01 NOTE — Assessment & Plan Note (Addendum)
 Sig adjustment issues with paraplegia are better but will continue the fluoxetine  Discussed virtual visit with counselor---he feels that he can hold off on this

## 2023-04-01 NOTE — Assessment & Plan Note (Signed)
 Still wheelchair bound Goal is eventual ability to transfer without lift---still working with PT and OT

## 2023-04-01 NOTE — Assessment & Plan Note (Signed)
 Last EF only slightly low at 43% Is on the entresto  24/26 bid and metoprolol  25 daily Periodic hypotension with neurogenic symptoms---limits goal directed therapy Cardiology discussed spironolactone --I would hold off

## 2023-04-01 NOTE — Assessment & Plan Note (Signed)
 Doing well on nubeqa  and lupron  Last PSA under 4 Goes back to Dr Wilhelmenia Harada in March

## 2023-04-01 NOTE — Assessment & Plan Note (Signed)
 Doing well with the current regimen Goes at night and wife cleans him in the morning

## 2023-04-09 ENCOUNTER — Telehealth: Payer: Self-pay

## 2023-04-09 IMAGING — MR MR SHOULDER*L* W/O CM
4 of 5 series · 18 of 40 positions shown · non-contrast
Comparison: None.

CLINICAL DATA: Chronic shoulder pain due to wear and tear from
general anesthesia job.

EXAM:
MRI OF THE LEFT SHOULDER WITHOUT CONTRAST
TECHNIQUE: Multiplanar, multisequence MR imaging of the shoulder was performed.
No intravenous contrast was administered.

[Series 3: T2 fat-sat · axial · 4.0mm · 0.27mm/px · z∈[-31,+51]mm · 4 of 21 slices shown (1 of 3)]
[im 1/21]
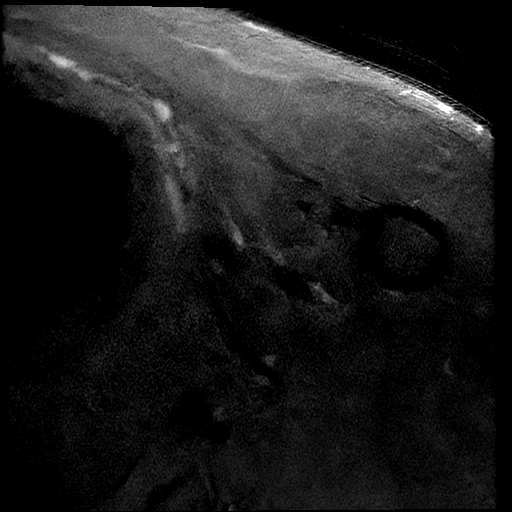
[im 3/21]
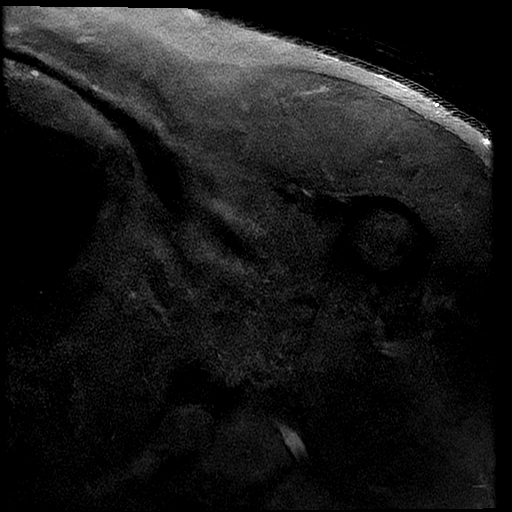
[im 12/21]
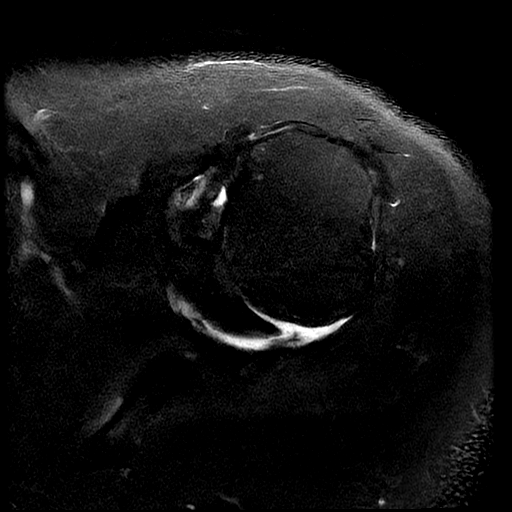
[im 18/21]
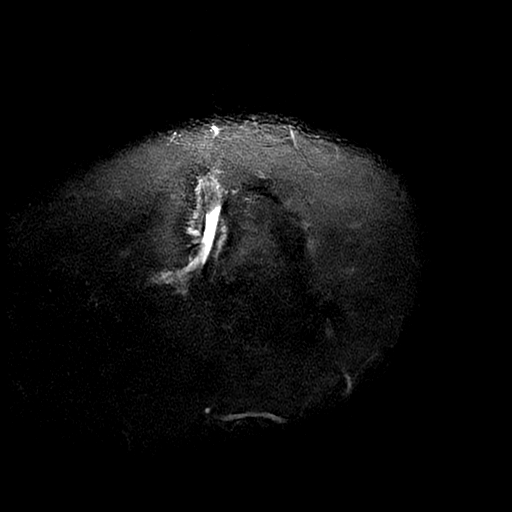

[Series 6: T2 fat-sat · oblique · 4.0mm · 0.31mm/px · 3 of 21 slices shown (2 of 3)]
[im 3/21]
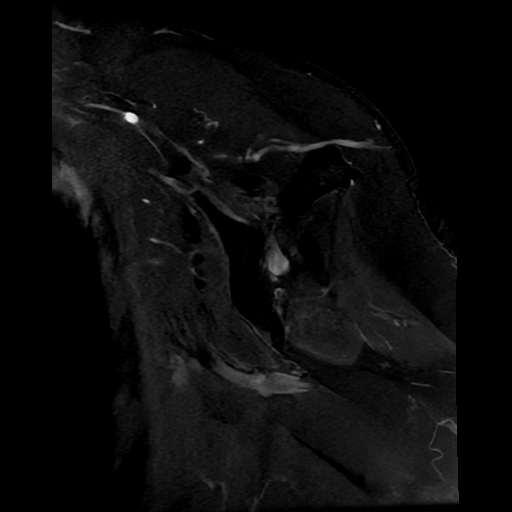
[im 12/21]
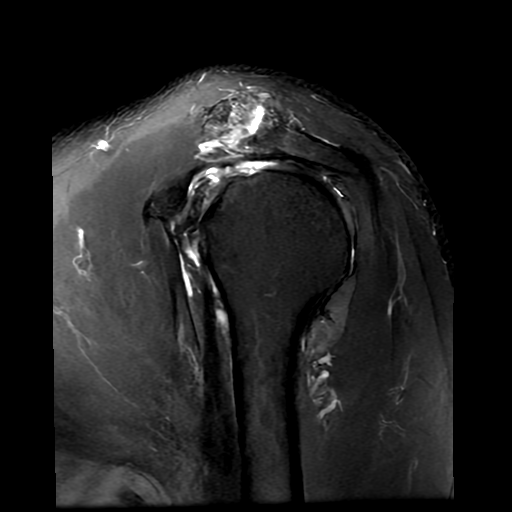
[im 18/21]
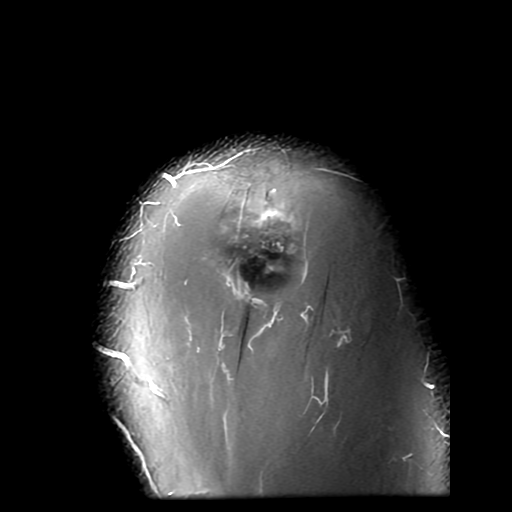

[Series 8: PD fat-sat · oblique · 4.0mm · 0.27mm/px · 8 of 22 slices shown]
[im 1/22]
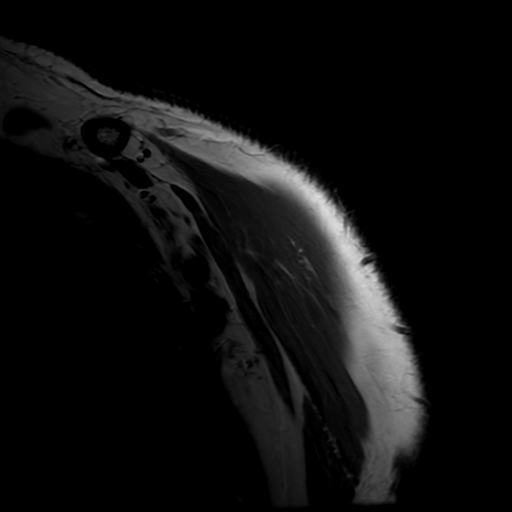
[im 4/22]
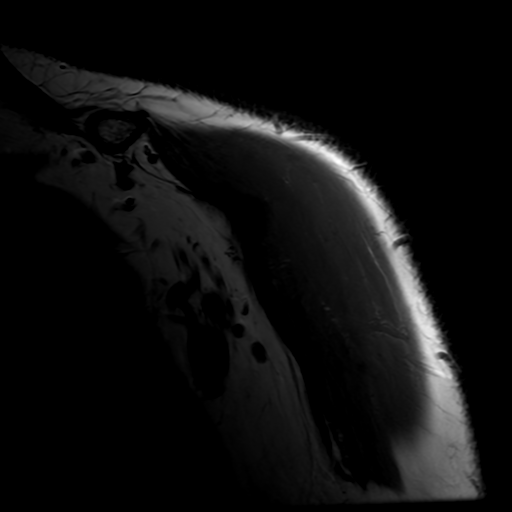
[im 7/22]
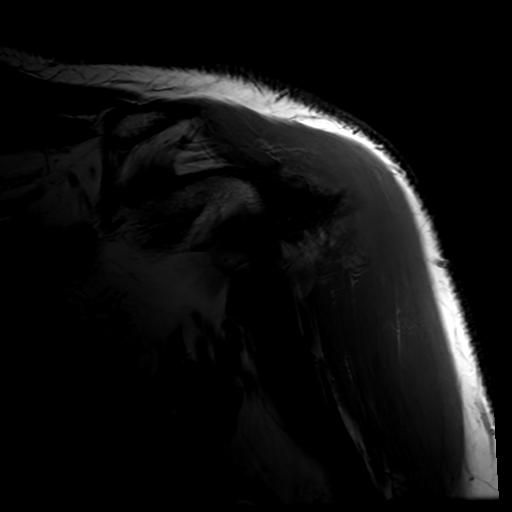
[im 10/22]
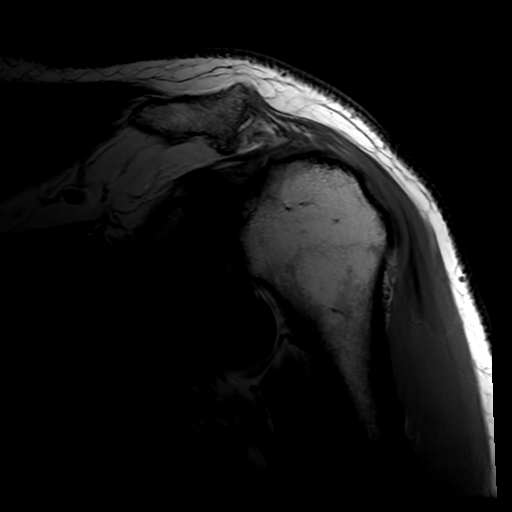
[im 13/22]
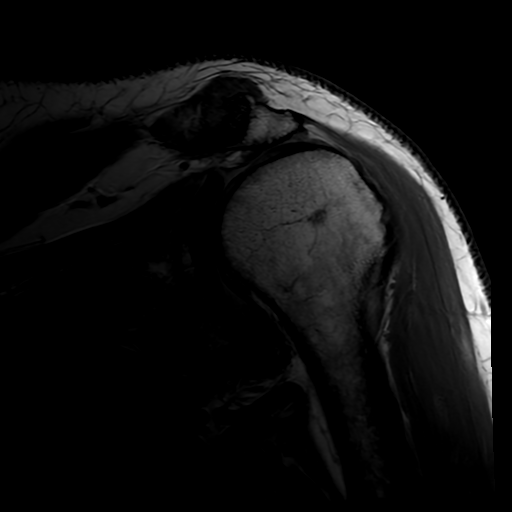
[im 16/22]
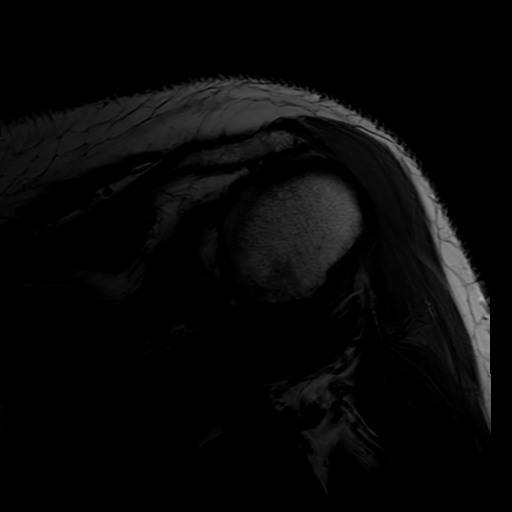
[im 19/22]
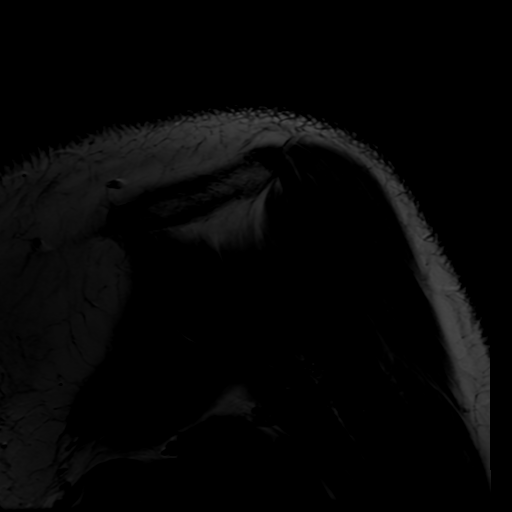
[im 22/22]
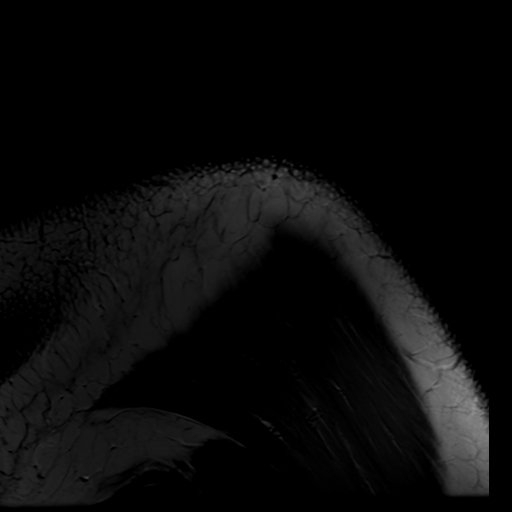

[Series 9: T2 fat-sat · oblique · 4.0mm · 0.27mm/px · 3 of 22 slices shown (3 of 3)]
[im 4/22]
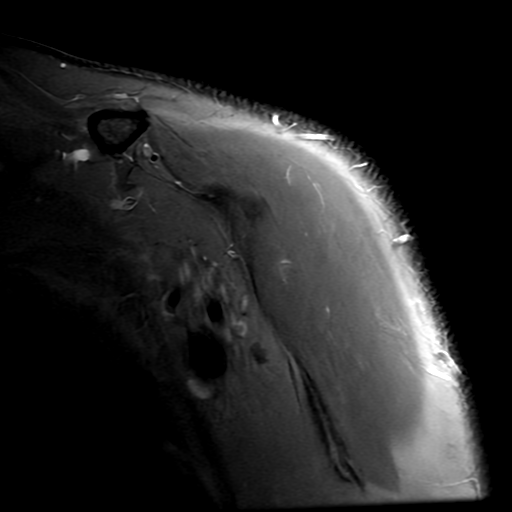
[im 13/22]
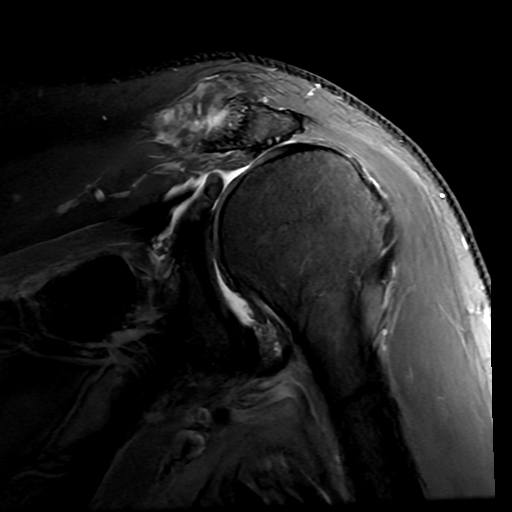
[im 19/22]
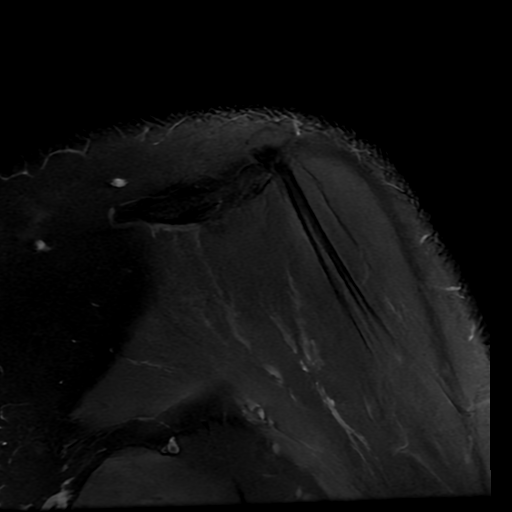

[18 of 40 positions shown; findings below may reference images not displayed]

FINDINGS: Rotator cuff: The humeral head is high-riding contacts the
undersurface of the acromion. There is a large full-thickness tear
of the entire supraspinatus and the anterior 50% of the
infraspinatus with likely additional partial-thickness articular
sided tearing of the more posterior infraspinatus tendon footprint.
There is tendon retraction to the superior aspect of the glenoid.

Moderate thinning of the articular side of the superior greater than
inferior aspects of the subscapularis tendon, partial-thickness
tearing. The teres minor is intact.

Muscles: Moderate to severe supraspinatus and infraspinatus and mild
subscapularis muscle atrophy.

Biceps long head: The intra-articular long head of the biceps tendon
is grossly intact.

Acromioclavicular Joint: There are moderate degenerative changes of
the acromioclavicular joint including joint space narrowing,
subchondral marrow edema, and peripheral osteophytosis. Type II
acromion.

Glenohumeral Joint: Moderate thinning of the glenohumeral cartilage
with full-thickness cartilage loss of the superomedial aspect of the
humeral head.

Labrum: Diffuse degeneration and irregularity of the glenoid labrum.

Bones:  No acute fracture.

Other: None.
IMPRESSION: :
IMPRESSION: 1. High-riding humeral head with massive full-thickness tear of the
entire supraspinatus tendon and the anterior 50% of the
infraspinatus tendon. Additional partial-thickness articular sided
tearing of the posterior infraspinatus. Moderate to high-grade
supraspinatus and infraspinatus muscle atrophy suggests these tears
are chronic.
2. Partial-thickness tearing of the superior greater than inferior
aspects of the subscapularis tendon footprint. Mild subscapularis
muscle atrophy.
3. Moderate degenerative changes of the acromioclavicular joint.
4. Moderate glenohumeral cartilage degenerative changes.

## 2023-04-09 NOTE — Telephone Encounter (Signed)
Copied from CRM [id number]. Topic: Clinical - Medication Question >> Apr 09, 2023  9:30 AM Almira Coaster wrote: Reason for CRM: Jason [name] from [facility] Is calling to request an order for Nystatin Cream because she noticed patient may have started to develop a fungal infection in the anal area. Best call back number is [phone].

## 2023-04-10 ENCOUNTER — Ambulatory Visit: Payer: Medicare Other | Admitting: Physician Assistant

## 2023-04-10 DIAGNOSIS — R339 Retention of urine, unspecified: Secondary | ICD-10-CM

## 2023-04-10 DIAGNOSIS — Z435 Encounter for attention to cystostomy: Secondary | ICD-10-CM

## 2023-04-10 MED ORDER — NYSTATIN 100000 UNIT/GM EX CREA: 1.0000 | TOPICAL_CREAM | Freq: Two times a day (BID) | CUTANEOUS | 1 refills | Status: DC

## 2023-04-10 NOTE — Telephone Encounter (Signed)
Called and spoke to pt's wife per DPR. She does want to do the Nystatin. Rx sent to [facility].

## 2023-04-10 NOTE — Addendum Note (Signed)
Addended by: Eual Fines on: 04/10/2023 09:43 AM   Modules accepted: Orders

## 2023-04-10 NOTE — Progress Notes (Signed)
Suprapubic Cath Change  Patient is present today for a suprapubic catheter change due to urinary retention.  8ml of water was drained from the balloon, a 16FR foley cath was removed from the tract with out difficulty.  Site was cleaned and prepped in a sterile fashion with betadine.  A 16FR foley cath was replaced into the tract no complications were noted. Urine return was noted, 10 ml of sterile water was inflated into the balloon and a night bag was attached for drainage.  Patient tolerated well.  Performed by: Carman Ching, PA-C   Additional notes: Stop Flomax.  Follow up: Return in about 4 weeks (around 05/08/2023) for SPT exchange.

## 2023-04-16 ENCOUNTER — Other Ambulatory Visit: Payer: Self-pay | Admitting: Internal Medicine

## 2023-04-24 ENCOUNTER — Ambulatory Visit: Payer: Self-pay | Admitting: Internal Medicine

## 2023-04-24 ENCOUNTER — Encounter: Payer: Self-pay | Admitting: Physical Medicine and Rehabilitation

## 2023-04-24 ENCOUNTER — Encounter
Payer: Medicare Other | Attending: Physical Medicine and Rehabilitation | Admitting: Physical Medicine and Rehabilitation

## 2023-04-24 VITALS — BP 130/76 | HR 57 | Ht 69.0 in | Wt 155.0 lb

## 2023-04-24 DIAGNOSIS — Z993 Dependence on wheelchair: Secondary | ICD-10-CM | POA: Diagnosis present

## 2023-04-24 DIAGNOSIS — C7940 Secondary malignant neoplasm of unspecified part of nervous system: Secondary | ICD-10-CM | POA: Diagnosis present

## 2023-04-24 DIAGNOSIS — C61 Malignant neoplasm of prostate: Secondary | ICD-10-CM | POA: Insufficient documentation

## 2023-04-24 DIAGNOSIS — R252 Cramp and spasm: Secondary | ICD-10-CM | POA: Diagnosis present

## 2023-04-24 DIAGNOSIS — G8250 Quadriplegia, unspecified: Secondary | ICD-10-CM | POA: Insufficient documentation

## 2023-04-24 MED ORDER — BACLOFEN 10 MG PO TABS: 5.0000 mg | ORAL_TABLET | Freq: Two times a day (BID) | ORAL | 5 refills | Status: DC | PRN

## 2023-04-24 MED ORDER — SILVER SULFADIAZINE 1 % EX CREA: 1.0000 | TOPICAL_CREAM | Freq: Every day | CUTANEOUS | 1 refills | Status: DC

## 2023-04-24 MED ORDER — AMOXICILLIN-POT CLAVULANATE 875-125 MG PO TABS: 1.0000 | ORAL_TABLET | Freq: Two times a day (BID) | ORAL | 0 refills | Status: DC

## 2023-04-24 NOTE — Telephone Encounter (Signed)
 Patient PCP is out of office today. Please advise. There are no appointment in office or around at this time.

## 2023-04-24 NOTE — Telephone Encounter (Signed)
 Copied from CRM [id number]. Topic: Clinical - Red Word Triage >> Apr 24, 2023  9:26 AM Randa Ngo wrote: [facility] that prompted transfer to Nurse Triage: Valentino Saxon, nurse, with [facility] has some concerns about patient's buttocks. Patient has severe break down near his anus and 4 inch perimeter around anus. Very red and inflamed. Patient was prescribed Nystatin and that is not helping to improve wound. Nurse also detected some wheezing in his upper lobe of his lung, and she also hears congestion and possible fluid in the lower right lung.    Chief Complaint: Skin Breakdown Symptoms: Skin breakdown around anus, mild SOB, congestion, wheezing Pertinent Negatives: Patient denies fever Disposition: [] ED /[] Urgent Care (no appt availability in office) / [x] Appointment(In office/virtual)/ []  [facility]/ [] Home Care/ [] Refused Recommended Disposition /[] [facility]/ []  Follow-up with PCP Additional Notes: Danny from [facility] called the office. She stated she is patient's home health nurse. She mentioned that patient has always had some redness around his anus. She has been away away for a couple months. She just returned and she saw patient today. She stated the area around his anus has worsened. The area has a 4 inch perimeter of redness. It looks inflamed and tender. There is skin breakdown that appears to be stage 2 or maybe 3. Dannielle Huh is concerned about infection. She stated patient is being treated with barrier cream, Nystatin, but it doesn't appear to be helping. She also stated that patient has CHF. He has mild SOB which is normal for him, but he is also having cough, congestion, and some wheezes heard during auscultation. She stated it is very difficult for patient to come in for an appointment due to being paraplegic. She stated she can send pictures of the skin breakdown. She is asking what can be done to help patient.   Dannielle Huh called from number:  [phone]  Reason for Disposition  [1] Skin around the wound has become red AND [2] larger than 2 inches (5 cm)  Answer Assessment - Initial Assessment Questions 1. LOCATION: "Where is the wound located?"      Around anus   2. WOUND APPEARANCE: "What does the wound look like?"      Redness, inflammation, tenderness,   3. SIZE: If redness is present, ask: "What is the size of the red area?" (Inches, centimeters, or compare to size of a coin)      4 inch diameter  4. ONSET: "When did it start to look infected?"      Caller is unsure. She has been away for a couple months. She just saw patient today and the area looks worse from when she last saw the patient.    5. PAIN: Do you have any pain?"  If Yes, ask: "How bad is the pain?"  (e.g., Scale 1-10; mild, moderate, or severe)    - MILD (1-3): Doesn't interfere with normal activities.     - MODERATE (4-7): Interferes with normal activities or awakens from sleep.    - SEVERE (8-10): Excruciating pain, unable to do any normal activities.       Caller stated it is difficult to assess due to patient being paraplegic  6. FEVER: "Do you have a fever?" If Yes, ask: "What is your temperature, how was it measured, and when did it start?"     No, patient's temp has been normal  7. OTHER SYMPTOMS: "Do you have any other symptoms?" (e.g., shaking chills, weakness, rash elsewhere on  body)     Congestion, cough, mild SOB (normal for patient)  Protocols used: Wound Infection Suspected-A-AH

## 2023-04-24 NOTE — Telephone Encounter (Signed)
 Spoke to pt's wife.She will send a picture Monday. Dr put him on baclofen. Asked if anything he takes would interact. I advised her to ask the pharmacist today.

## 2023-04-24 NOTE — Telephone Encounter (Signed)
 Please let her know I sent in an antibiotic and an antibiotic cream to use directly on the ulcer. If it is not better on Monday--she should send me a picture on MyChart on Monday. If he gets sick, will need to get him to the ER

## 2023-04-24 NOTE — Addendum Note (Signed)
 Addended by: Tillman Abide I on: 04/24/2023 12:57 PM   Modules accepted: Orders

## 2023-04-24 NOTE — Patient Instructions (Signed)
 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    Suggest trying to speak with PCP about wheezing, not specifically Antibiotics since feels OK.    2.  Inflammation or infections/ can cause spasticity to be worse- abruptly   3. Will try Baclofen  5-10 mg 2x/day as needed  for spasms-  can put at side of bed- to can take if needs it. Don't use baclofen and stand/transfer afterwards.    4. B/L PRAFOs ordered written-by [employer]-  can wear gloves to sleep with if hands cold, don't need resting wrist splints anymore   5. Continue doing H/H PT, OT and nursing due to incomplete quad and pressure ulcer   6.  Increase Prozac vs bite guard from dentist.    7. Goals need to be focused on transferring, as compared to walking at this point.  Not on walking.    8.  If has bradycardia- if no symptoms, it's NORMAL-for SCI.    9. Has MRI in 11/25 for f/u on Prostate CA- PSA is at 3.4- was 1500 prior- on androgen deprivation therapy.    10. F/U 3 moths- double visit SCI

## 2023-04-24 NOTE — Progress Notes (Signed)
 Subjective:    Patient ID: Jason [name], male    DOB: [birth year], 68 y.o.   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     Getting MRI of 11/25- under anesthesia.  Of note.    Good overall.   Now using pedal bike- and now able ot use Doing around [place]- for core strength.   Yesterday, got up on EOB- needed Min A to get into position and core is building up - held self on SOB upright.   Next step is transfer board or sit to stand- down the road, but new goals-  Still doing H/H-  PT 1x/week and OT 2x/week and nurse 1x/week- pressure ulcer so coming out 1x/week.   Will just heal and then break open again.  Nystatin cream currently-  Was on Z pack 2 months ago and now wheezing-    Bowel program- still having BM in middle of night- - took too long to stimulate/dig stim until had BM- but usually just cleans up in AM- because dig stim more didn't help much.    No UTI's.  Last one 02/2022.    Twitching at night in legs- some nights cannot sleep due to twitching.  We've talked about not doing spasticity meds, and haven't so far.  Had some but bad this past week-     Asking for opinion- on spasticity, muscle spasms.   Sleeping with PRAFOs- nightly-  Needs new PRAFOs Needs new WHO's- if needed.   Ankles haven't swollen since puts pillow under feet.   Keeps jaw clenched- when wakes up at night- jaw clenched- once in awhile- probably does nightly.     Pain Inventory Average Pain 1 Pain Right Now 1 My pain is aching  LOCATION OF PAIN  buttocks  BOWEL Number of stools per week: 7 Oral laxative use  on a bowel program Type of laxative . Enema or suppository use No  History of colostomy No  Incontinent No    BLADDER Suprapubic In and out cath, frequency gets changed once a month Able to self cath  . Bladder incontinence No  Frequent urination No  Leakage with coughing No  Difficulty starting stream No  Incomplete bladder emptying No    Mobility how many minutes can you walk? 0 ability to climb steps?  no do you drive?  no use a wheelchair needs help with transfers  Function disabled: date disabled . I need assistance with the following:  dressing, bathing, toileting, meal prep, household duties, and shopping  Neuro/Psych bladder control problems bowel control problems weakness trouble walking  Prior Studies Any changes since last visit?  no  Physicians involved in your care Any changes since last visit?  no   Family History  Problem Relation Age of Onset   Hypertension Mother    Stroke Mother    Gout Father    Heart attack Father 21   Heart disease Sister    Lung cancer Sister    Drug abuse Sister    Breast cancer Paternal Aunt    Lung cancer Maternal Grandmother    Colon cancer Neg Hx    Esophageal cancer Neg Hx    Rectal cancer Neg Hx    Stomach cancer Neg  Hx    Social History   Socioeconomic History   Marital status: Married    Spouse name: [name]    Number of children: 0   Years of education: Not on file   Highest education level: Bachelor's degree (e.g., BA, AB, BS)  Occupational History   Occupation: Warden/ranger: [employer]  Tobacco Use   Smoking status: Never    Passive exposure: Never   Smokeless tobacco: Never  Vaping Use   Vaping status: Never Used  Substance and Sexual Activity   Alcohol use: Not Currently   Drug use: No   Sexual activity: Not Currently  Other Topics Concern   Not on file  Social History Narrative   Lives at home with spouse.    Social Drivers of Corporate investment banker Strain: Low Risk  (03/31/2023)   Overall Financial Resource Strain (CARDIA)    Difficulty of Paying Living Expenses: Not  hard at all  Food Insecurity: No Food Insecurity (03/31/2023)   Hunger Vital Sign    Worried About Running Out of Food in the Last Year: Never true    Ran Out of Food in the Last Year: Never true  Transportation Needs: No Transportation Needs (03/31/2023)   PRAPARE - Administrator, Civil Service (Medical): No    Lack of Transportation (Non-Medical): No  Physical Activity: Insufficiently Active (03/31/2023)   Exercise Vital Sign    Days of Exercise per Week: 3 days    Minutes of Exercise per Session: 30 min  Stress: No Stress Concern Present (03/31/2023)   [employer] of Occupational Health - Occupational Stress Questionnaire    Feeling of Stress : Only a little  Social Connections: Moderately Integrated (03/31/2023)   Social Connection and Isolation Panel [NHANES]    Frequency of Communication with Friends and Family: Three times a week    Frequency of Social Gatherings with Friends and Family: Three times a week    Attends Religious Services: Never    Active Member of Clubs or Organizations: Yes    Attends Banker Meetings: 1 to 4 times per year    Marital Status: Married   Past Surgical History:  Procedure Laterality Date   CARDIAC CATHETERIZATION  03/2000   HERNIA REPAIR  1961   umbilical   IR CATHETER TUBE CHANGE  12/12/2022   IR US GUIDE BX ASP/DRAIN  11/11/2022   LEFT HEART CATH AND CORONARY ANGIOGRAPHY Left 03/20/2020   Procedure: LEFT HEART CATH AND CORONARY ANGIOGRAPHY;  Surgeon: Yvonne Kendall, MD;  Location: [facility];  Service: Cardiovascular;  Laterality: Left;   POSTERIOR CERVICAL FUSION/FORAMINOTOMY N/A 10/06/2021   Procedure: POSTERIOR CERVICAL FUSION/ FORAMINOTOMY LEVEL 3;  Surgeon: Venetia Night, MD;  Location: [facility];  Service: Neurosurgery;  Laterality: N/A;  will need monitoring   RADIOLOGY WITH ANESTHESIA Left 04/18/2021   Procedure: MRI SHOULDER WITHOUT CONTRAST WITH ANESTHESIA;  Surgeon: Radiologist, Medication, MD;   Location: [facility];  Service: Radiology;  Laterality: Left;   RADIOLOGY WITH ANESTHESIA N/A 10/03/2021   Procedure: MRI CERVICAL SPINE WITH ANESTHESIA;  Surgeon: Radiologist, Medication, MD;  Location: [facility] OR;  Service: Radiology;  Laterality: N/A;   RADIOLOGY WITH ANESTHESIA N/A 01/30/2022   Procedure: MRI CERVICAL SPINE WITH AND WITOHOUT CONTRAST WITH ANESTHESIA;  Surgeon: Radiologist, Medication, MD;  Location: [facility] OR;  Service: Radiology;  Laterality: N/A;   RADIOLOGY WITH ANESTHESIA N/A 05/22/2022   Procedure: MRI LUMBER SPINE AND CERVIC SPINE WITH AND WITHOUT  CONTRAST;  Surgeon: Radiologist, Medication, MD;  Location: [facility] OR;  Service: Radiology;  Laterality: N/A;   RADIOLOGY WITH ANESTHESIA N/A 12/25/2022   Procedure: MRI WITH ANESTHESIA OF CERVICAL SPINE WITH AND WITHOUT CONTRAST,CARDIAC MORPHOLOGY;  Surgeon: Radiologist, Medication, MD;  Location: [facility] OR;  Service: Radiology;  Laterality: N/A;   REVERSE SHOULDER ARTHROPLASTY Left    ROTATOR CUFF REPAIR  11/2010   Dr Hyacinth Meeker   SUPRAPUBIC CATHETER INSERTION  11/11/2022   TONSILLECTOMY     TOTAL HIP ARTHROPLASTY Right 02/24/2009   TOTAL HIP ARTHROPLASTY Left 06/2016   Dr Odis Luster   Past Medical History:  Diagnosis Date   Allergy    Arthritis    osteoarthritis   Cancer [facility])    prostate   CHF (congestive heart failure) (HCC)    GERD (gastroesophageal reflux disease)    History of kidney stones    passed   Hypertension    Hypothyroidism    Palpitations 2022   Paralysis (HCC)    paraplegia 09/2021-surgery or stroke   Pneumonia    12/22/28- > 25 years ago   Stroke [facility])    Mini stroke - Aug 2023   Thyroid disease    Hyperthyroidism s/p radioactive iodine ablation   BP 130/76   Pulse (!) 57   Ht 5\' 9"  (1.753 m)   Wt 155 lb (70.3 kg)   SpO2 96%   BMI 22.89 kg/m   Opioid Risk Score:   Fall Risk Score:  `1  Depression screen PHQ 2/9     01/16/2023    1:09 PM 09/19/2022   12:55 PM 12/02/2021    1:20 PM 10/15/2020    3:41 PM  10/10/2019    3:10 PM 10/05/2018    7:54 AM 08/28/2017    8:28 AM  Depression screen PHQ 2/9  Decreased Interest 0 0 0 0 0 0 0  Down, Depressed, Hopeless 0 0 1 0 0 0 0  PHQ - 2 Score 0 0 1 0 0 0 0  Altered sleeping   0      Tired, decreased energy   1      Change in appetite   0      Feeling bad or failure about yourself    0      Trouble concentrating   0      Moving slowly or fidgety/restless   0      Suicidal thoughts   0      PHQ-9 Score   2         Review of Systems  Musculoskeletal:        Paraplegia  All other systems reviewed and are negative.      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    Suggest trying to speak with PCP about wheezing, not specifically Antibiotics since feels OK.    2.  Inflammation or infections/ can cause spasticity to be worse- abruptly  3. Will try Baclofen  5-10 mg 2x/day as needed  for spasms-  can put at side of bed- to can take if needs it. Don't use baclofen and stand/transfer afterwards.    4. B/L PRAFOs ordered written-by [facility]-  can wear gloves to sleep with if hands cold, don't need resting wrist splints anymore   5. Continue doing H/H PT, OT and nursing due to incomplete quad and pressure ulcer   6.  Increase Prozac vs bite guard from dentist.    7. Goals need to be focused on transferring, as compared to walking at this point.  Not on walking.    8.  If has bradycardia- if no symptoms, it's NORMAL-for SCI.    9. Has MRI in 11/25  for f/u on Prostate CA- PSA is at 3.4- was 1500 prior- on androgen deprivation therapy.    10. F/U 3 moths- double visit SCI   I spent a total of 43   minutes on total care today- >50% coordination of care- due to discussion about spasticity, therapies; bradycardia, anxiety; and increase in spasticity occurs with inflammation

## 2023-04-27 ENCOUNTER — Encounter: Payer: Self-pay | Admitting: Internal Medicine

## 2023-04-29 NOTE — Telephone Encounter (Signed)
 Spoke to [place] with [facility]. Gave verbal orders

## 2023-04-29 NOTE — Telephone Encounter (Signed)
 Copied from CRM [id number]. Topic: General - Other >> Apr 29, 2023 12:18 PM Alcus Dad wrote: Reason for CRM: Dani from [facility] went to see patient last Thursday and patient had an Area of breakdown around anus... no approved prn visits left. Dani wants to know if Dr. Dewayne Hatch her to continue seeing patient. If so, she would need an order stating that. Please call [phone] for any questions and also for any updates that you have.

## 2023-05-07 ENCOUNTER — Ambulatory Visit: Payer: Medicare Other | Admitting: Physician Assistant

## 2023-05-08 ENCOUNTER — Inpatient Hospital Stay: Payer: Medicare Other | Admitting: Oncology

## 2023-05-08 ENCOUNTER — Encounter: Payer: Self-pay | Admitting: Medical

## 2023-05-08 ENCOUNTER — Ambulatory Visit (INDEPENDENT_AMBULATORY_CARE_PROVIDER_SITE_OTHER): Payer: Medicare Other | Admitting: Medical

## 2023-05-08 ENCOUNTER — Encounter: Payer: Self-pay | Admitting: Oncology

## 2023-05-08 ENCOUNTER — Inpatient Hospital Stay: Payer: Medicare Other | Attending: Anatomic Pathology & Clinical Pathology

## 2023-05-08 ENCOUNTER — Ambulatory Visit (INDEPENDENT_AMBULATORY_CARE_PROVIDER_SITE_OTHER): Payer: Medicare Other | Admitting: Physician Assistant

## 2023-05-08 VITALS — BP 136/73 | HR 51 | Ht 69.0 in | Wt 155.0 lb

## 2023-05-08 VITALS — BP 126/66 | HR 48 | Temp 97.2°F | Resp 18 | Wt 155.0 lb

## 2023-05-08 DIAGNOSIS — I5022 Chronic systolic (congestive) heart failure: Secondary | ICD-10-CM | POA: Insufficient documentation

## 2023-05-08 DIAGNOSIS — Z8249 Family history of ischemic heart disease and other diseases of the circulatory system: Secondary | ICD-10-CM | POA: Diagnosis not present

## 2023-05-08 DIAGNOSIS — Z87442 Personal history of urinary calculi: Secondary | ICD-10-CM | POA: Insufficient documentation

## 2023-05-08 DIAGNOSIS — I77819 Aortic ectasia, unspecified site: Secondary | ICD-10-CM | POA: Insufficient documentation

## 2023-05-08 DIAGNOSIS — C7951 Secondary malignant neoplasm of bone: Secondary | ICD-10-CM | POA: Insufficient documentation

## 2023-05-08 DIAGNOSIS — C61 Malignant neoplasm of prostate: Secondary | ICD-10-CM | POA: Insufficient documentation

## 2023-05-08 DIAGNOSIS — M4802 Spinal stenosis, cervical region: Secondary | ICD-10-CM | POA: Insufficient documentation

## 2023-05-08 DIAGNOSIS — D649 Anemia, unspecified: Secondary | ICD-10-CM | POA: Insufficient documentation

## 2023-05-08 DIAGNOSIS — K5730 Diverticulosis of large intestine without perforation or abscess without bleeding: Secondary | ICD-10-CM | POA: Insufficient documentation

## 2023-05-08 DIAGNOSIS — Q2112 Patent foramen ovale: Secondary | ICD-10-CM | POA: Insufficient documentation

## 2023-05-08 DIAGNOSIS — I493 Ventricular premature depolarization: Secondary | ICD-10-CM | POA: Insufficient documentation

## 2023-05-08 DIAGNOSIS — C7940 Secondary malignant neoplasm of unspecified part of nervous system: Secondary | ICD-10-CM | POA: Diagnosis not present

## 2023-05-08 DIAGNOSIS — I10 Essential (primary) hypertension: Secondary | ICD-10-CM | POA: Insufficient documentation

## 2023-05-08 DIAGNOSIS — I509 Heart failure, unspecified: Secondary | ICD-10-CM | POA: Insufficient documentation

## 2023-05-08 DIAGNOSIS — Z79899 Other long term (current) drug therapy: Secondary | ICD-10-CM | POA: Insufficient documentation

## 2023-05-08 DIAGNOSIS — Z823 Family history of stroke: Secondary | ICD-10-CM | POA: Diagnosis not present

## 2023-05-08 DIAGNOSIS — M47816 Spondylosis without myelopathy or radiculopathy, lumbar region: Secondary | ICD-10-CM | POA: Diagnosis not present

## 2023-05-08 DIAGNOSIS — Z803 Family history of malignant neoplasm of breast: Secondary | ICD-10-CM | POA: Diagnosis not present

## 2023-05-08 DIAGNOSIS — D539 Nutritional anemia, unspecified: Secondary | ICD-10-CM | POA: Diagnosis not present

## 2023-05-08 DIAGNOSIS — Z79818 Long term (current) use of other agents affecting estrogen receptors and estrogen levels: Secondary | ICD-10-CM

## 2023-05-08 DIAGNOSIS — Z86718 Personal history of other venous thrombosis and embolism: Secondary | ICD-10-CM

## 2023-05-08 DIAGNOSIS — M48061 Spinal stenosis, lumbar region without neurogenic claudication: Secondary | ICD-10-CM | POA: Insufficient documentation

## 2023-05-08 DIAGNOSIS — I700 Atherosclerosis of aorta: Secondary | ICD-10-CM | POA: Diagnosis not present

## 2023-05-08 DIAGNOSIS — Z435 Encounter for attention to cystostomy: Secondary | ICD-10-CM | POA: Diagnosis not present

## 2023-05-08 DIAGNOSIS — Z8673 Personal history of transient ischemic attack (TIA), and cerebral infarction without residual deficits: Secondary | ICD-10-CM | POA: Insufficient documentation

## 2023-05-08 DIAGNOSIS — E039 Hypothyroidism, unspecified: Secondary | ICD-10-CM | POA: Diagnosis not present

## 2023-05-08 DIAGNOSIS — I110 Hypertensive heart disease with heart failure: Secondary | ICD-10-CM | POA: Diagnosis not present

## 2023-05-08 DIAGNOSIS — Z9089 Acquired absence of other organs: Secondary | ICD-10-CM | POA: Diagnosis not present

## 2023-05-08 DIAGNOSIS — R5383 Other fatigue: Secondary | ICD-10-CM | POA: Diagnosis not present

## 2023-05-08 DIAGNOSIS — G8220 Paraplegia, unspecified: Secondary | ICD-10-CM | POA: Diagnosis not present

## 2023-05-08 LAB — CBC WITH DIFFERENTIAL (CANCER CENTER ONLY)
Abs Immature Granulocytes: 0.01 10*3/uL (ref 0.00–0.07)
Basophils Absolute: 0 10*3/uL (ref 0.0–0.1)
Basophils Relative: 1 %
Eosinophils Absolute: 0.2 10*3/uL (ref 0.0–0.5)
Eosinophils Relative: 5 %
HCT: 37.7 % — ABNORMAL LOW (ref 39.0–52.0)
Hemoglobin: 12.4 g/dL — ABNORMAL LOW (ref 13.0–17.0)
Immature Granulocytes: 0 %
Lymphocytes Relative: 18 %
Lymphs Abs: 0.7 10*3/uL (ref 0.7–4.0)
MCH: 34.1 pg — ABNORMAL HIGH (ref 26.0–34.0)
MCHC: 32.9 g/dL (ref 30.0–36.0)
MCV: 103.6 fL — ABNORMAL HIGH (ref 80.0–100.0)
Monocytes Absolute: 0.3 10*3/uL (ref 0.1–1.0)
Monocytes Relative: 8 %
Neutro Abs: 2.9 10*3/uL (ref 1.7–7.7)
Neutrophils Relative %: 68 %
Platelet Count: 193 10*3/uL (ref 150–400)
RBC: 3.64 MIL/uL — ABNORMAL LOW (ref 4.22–5.81)
RDW: 13.2 % (ref 11.5–15.5)
WBC Count: 4.2 10*3/uL (ref 4.0–10.5)
nRBC: 0 % (ref 0.0–0.2)

## 2023-05-08 LAB — CMP (CANCER CENTER ONLY)
ALT: 44 U/L (ref 0–44)
AST: 39 U/L (ref 15–41)
Albumin: 3.6 g/dL (ref 3.5–5.0)
Alkaline Phosphatase: 85 U/L (ref 38–126)
Anion gap: 7 (ref 5–15)
BUN: 11 mg/dL (ref 8–23)
CO2: 29 mmol/L (ref 22–32)
Calcium: 8.9 mg/dL (ref 8.9–10.3)
Chloride: 101 mmol/L (ref 98–111)
Creatinine: 0.49 mg/dL — ABNORMAL LOW (ref 0.61–1.24)
GFR, Estimated: >60 mL/min (ref >60)
Glucose, Bld: 79 mg/dL (ref 70–99)
Potassium: 4.1 mmol/L (ref 3.5–5.1)
Sodium: 137 mmol/L (ref 135–145)
Total Bilirubin: 1.1 mg/dL (ref 0.0–1.2)
Total Protein: 6.2 g/dL — ABNORMAL LOW (ref 6.5–8.1)

## 2023-05-08 LAB — PSA: Prostatic Specific Antigen: 3.75 ng/mL (ref 0.00–4.00)

## 2023-05-08 MED ORDER — NUBEQA 300 MG PO TABS: 600.0000 mg | ORAL_TABLET | Freq: Two times a day (BID) | ORAL | 2 refills | Status: DC

## 2023-05-08 MED ORDER — SPIRONOLACTONE 25 MG PO TABS: 12.5000 mg | ORAL_TABLET | Freq: Every day | ORAL | 3 refills | Status: DC

## 2023-05-08 NOTE — Assessment & Plan Note (Signed)
 Stable hemoglobin

## 2023-05-08 NOTE — Assessment & Plan Note (Addendum)
 Metastatic castration sensitive prostate cancer, with CNS involvement. Currently on chemoprevention therapy with Eligard.  Status post palliative radiation to spine. PMSA PET scan showed partial response. Previously on Xtandi 160mg  daily, switched to Darolutamide 600mg  BID due to cardiology issues/drug interactions.  Labs are reviewed and discussed with patient.  Continue Darolutamide 600mg  BID  PSA is pending

## 2023-05-08 NOTE — Progress Notes (Signed)
 Cardiology Office Note:  .   Date:  05/08/2023  ID:  Jason [name], DOB [birth year], MRN [id number] PCP: Jason Schwalbe, MD  [facility] Providers Cardiologist:  Jason Kendall, MD Electrophysiologist:  Jason Prude, MD     History of Present Illness: .   Jason [name] is a 68 y.o. male with a hx of metastatic prostate cancer, h/o stroke, HTN, thyroid disease, GERD, HFrEF, NICM, osteoarthritis, and PVCs who presents for 3 month follow-up.   The patient underwent remote stress test in 2002 which led to cardiac cath that showed no significant CAD.    The patient was evaluated for palpitations in 2022. He underwent cardiac cath 02/2020 that showed no significant CAD, normal LV function with mildly elevated filling pressures consistent with diastolic dysfunction. He was started on metorpolol for PVCs.    Seen in the office 03/30/20 reporting palpitations. A heart monitor was ordered that showed SR, rare PVC/PACs, NSVT, PSVT. He was placed on Toprol. Echo 06/2020 showed LVEF 50-55%, no WMA, mild LVH, G1DD, RVSF, normal RV size and function.   Patient was seen in June 2024 reporting low heart rate into the 30s and 40s.  EKG showed normal sinus rhythm, heart rate 60 to 70s with frequent PVCs.  A 2-week heart monitor was ordered and labs were drawn.  Heart monitor showed predominantly normal sinus rhythm with an average heart rate of 50, rare PACs and frequent PVCs 15.8% burden, 6 episodes of nonsustained VT, 11 runs of SVT, lasting up to 14 seconds. Echo showed LVEF 25-30%, G2DD, mild to mod MR. The patient was referred to EP, who recommended cMRI. Lisinopril was stopped and he was started on Entresto. cMRI showed LVEF 43%, high risk scar, PFO noted, and mild aortic dilation.   The patient was last seen 01/2023 and reported low BP and Toprol was decreased.  Today, the patient is overall doing well. BP and HR are better. They never hear back regarding Jardiance. He denies chest pain,  SOB, lower leg edema, dizziness, lightheadedness, palpitations, or heart racing. Bps at home have been normal.   Studies Reviewed: .       cMRI 11/2022 IMPRESSION: 1.  Mild decrease in LVEF, 43%.   2. Mild increase in ECV signal no evidence of LGE suggestive of high risk scar.   3.  PFO noted.   4. Mild aortic dilation, 40 mm. Consider repeat cardiac imaging in one year.   Jason Lam MD     Electronically Signed   By: Jason [name] M.D.   On: 12/25/2022 13:39   Echo limited 09/2022 1. Left ventricular ejection fraction, by estimation, is 25 to 30%. The  left ventricle has severely decreased function. The left ventricle  demonstrates global hypokinesis. The left ventricular internal cavity size  was moderately dilated. Left  ventricular diastolic parameters are consistent with Grade II diastolic  dysfunction (pseudonormalization). The average left ventricular global  longitudinal strain is -10.8 %. The global longitudinal strain is  abnormal.   2. Right ventricular systolic function is normal. The right ventricular  size is normal.   3. The mitral valve is normal in structure. Mild to moderate mitral valve  regurgitation. No evidence of mitral stenosis.   4. The aortic valve is normal in structure. Aortic valve regurgitation is  mild. No aortic stenosis is present.   5. The inferior vena cava is normal in size with greater than 50%  respiratory variability, suggesting right atrial pressure of 3  mmHg.    Heart monitor 08/2022     The patient was monitored for 14 days; only 9 days, 20 hours were suitable for analysis.   The predominant rhythm was sinus with an average rate of 50 bpm in sinus (range 39-81 bpm).   Rare PAC's and frequent PVC's were observed (PVC burden 15.8%).   6 episodes of nonsustained ventricular tachycardia occurred, lasting up to 7 beats with a maximum rate of 139 bpm.   11 supraventricular runs were noted, lasting up to 14.0 seconds  with a maximum rate of 126 bpm.   No sustained arrhythmia or prolonged pause was seen.   There were no patient triggered events.   Predominantly sinus rhythm with frequent PVC's (15.8% burden) and several episodes of NSVT and PSVT, as detailed above.   LHC 2022 Conclusions: No angiographically significant coronary artery disease. Normal left ventricular contraction with mildly elevated filling pressures consistent with diastolic dysfunction.   Recommendations: Primary prevention of coronary artery disease. Continue blood pressure control as well as recently added metoprolol for management of incidentally noted PVCs.  If symptoms persist, ambulatory cardiac monitoring +/- echcardiography will need to be considered.   Jason Kendall, MD [facility]      Physical Exam:   VS:  BP 136/73   Pulse (!) 51   Ht 5\' 9"  (1.753 m)   Wt 155 lb (70.3 kg) Comment: patient reported  SpO2 100%   BMI 22.89 kg/m    Wt Readings from Last 3 Encounters:  05/08/23 155 lb (70.3 kg)  05/08/23 155 lb (70.3 kg)  04/24/23 155 lb (70.3 kg)    GEN: Well nourished, well developed in no acute distress NECK: No JVD; No carotid bruits CARDIAC: RR, bradycardia, no murmurs, rubs, gallops RESPIRATORY:  Clear to auscultation without rales, wheezing or rhonchi  ABDOMEN: Soft, non-tender, non-distended EXTREMITIES:  No edema; No deformity   ASSESSMENT AND PLAN: .    HFrEF Prior echo 09/2022 showed LVEF 25-30%. LHC 2022 showed no significant CAD. cMRI 11/2022 showed LVEF 43%. Suspect CM due to PVCs vs chemotherapy. Continue Toprol 25mg  daily, Entresto 24-26mg  BID. Unable to get Jardiance approved. I will start spironolactone 12.5mg  daily. BMET in 2 weeks.   PVCs Prior heart monitor 08/2022 showed PVC burden 15.8%. Echo 09/2022 showed reduced LVEF 25-30%. He was started on GDMT And EF improved to 43% by cMRI. Patient did see EP. Continue Toprol 25mg  daily.   HTN BP is normal, continue current medications.    Aortic dilation cMRI showed 40mm dilation. Repeat imaging in one year.   PFO Noted incidentally on cMRI. Continue ASA 81mg  daily.     Dispo: Follow-up 4 months with Dr. Okey Dupre  Signed, Brunette Lavalle David Stall, PA-C

## 2023-05-08 NOTE — Progress Notes (Signed)
 Suprapubic Cath Change  Patient is present today for a suprapubic catheter change due to urinary retention.  8ml of water was drained from the balloon, a 16FR foley cath was removed from the tract with out difficulty.  Site was cleaned and prepped in a sterile fashion with betadine.  A 16FR foley cath was replaced into the tract no complications were noted. Urine return was noted, 10 ml of sterile water was inflated into the balloon and a night bag was attached for drainage.  Patient tolerated well.   Performed by: Carman Ching, PA-C   Follow up: Return in about 4 weeks (around 06/05/2023) for SPT exchange.

## 2023-05-08 NOTE — Progress Notes (Signed)
 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 Currently on chemoprevention therapy with Eligard.  Status post palliative radiation to spine. PMSA PET scan showed partial response. Previously on Xtandi 160mg  daily, switched to Darolutamide 600mg  BID due to cardiology issues/drug interactions.  Labs are reviewed and discussed with patient.  Continue Darolutamide 600mg  BID  PSA is pending   Androgen deprivation therapy On Eligard 45mg  Q6 months. Next dose due June 2025  History of deep vein thrombosis (DVT) of lower extremity Provoked, continue Aspirin 162 mg daily for prophylaxis.  Macrocytic anemia Stable hemoglobin .    Orders Placed This Encounter  Procedures   CBC with Differential (Cancer Center Only)    Standing Status:   Future    Expected Date:   08/08/2023    Expiration Date:   05/07/2024   CMP (Cancer Center only)    Standing Status:   Future    Expected Date:   08/08/2023    Expiration Date:   05/07/2024   PSA    Standing Status:   Future    Expected Date:   08/08/2023    Expiration Date:   05/07/2024   Follow up 3 months.   All questions were answered. The patient knows to call the clinic with any problems, questions or concerns.  [name], MD, PhD [facility] Hematology Oncology 05/08/2023        HISTORY OF PRESENTING ILLNESS:  Jason [name] 68 y.o. male presents to establish care for metastatic prostate cancer I have reviewed his chart and materials related to his  cancer extensively and collaborated history with the patient. 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  Associated epidural disease greater at the C6 where there is marked canal stenosis without cord compression.  Left greater than right foraminal effacement due to the extraosseous disease.   10/03/2021 Imaging   MRI left shoulder showed High-riding humeral head with massive full-thickness tear of the entire supraspinatus tendon and the anterior 50% of the infraspinatus tendon. Additional partial-thickness articular sided tearing of the posterior infraspinatus. Moderate to high-grade supraspinatus and infraspinatus muscle atrophy suggests these tears  are chronic. Partial-thickness tearing of the superior greater than inferior  aspects of the subscapularis tendon footprint. Mild subscapularis muscle atrophy.  Moderate degenerative changes of the acromioclavicular joint. Moderate glenohumeral cartilage degenerative changes.   10/05/2021 Imaging   CT chest abdomen pelvis with contrast showed  Moderate severity retroperitoneal and pelvic lymphadenopathy,consistent with metastatic disease. Small lytic areas at the levels of L4 and S1, with diffusely sclerotic changes involving the second and fourth right ribs, sacrum and left iliac bone. 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  Sigmoid diverticulosis. Aortic atherosclerosis   10/05/2021 Imaging   CT cervical spine  without contrast Extensive heterogeneous sclerotic lesions throughout the cervical spine compatible with metastatic  disease to the bone.  Mixed sclerotic and lytic changes in the posterior elements at C5 and C6.Extraosseous tumor at C5 and C6 is better appreciated on the MRI scan.   10/05/2021 Tumor Marker   PSA >1500   10/06/2021 Cancer Staging   Staging form: Prostate, AJCC 8th Edition - Clinical stage from 10/06/2021: Stage IVB (cN1, pM1c, PSA: 1500) - Signed by [name], MD on 12/04/2021 Stage prefix: Initial diagnosis Prostate specific antigen (PSA) range: 20 or greater   10/08/2021 Imaging   MRI thoracic spine and the lumbar spine with and without contrast 1. 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 2. 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 3. Additional epidural tumor along the posterior endplates at T9,T11, and T12 without high-grade spinal canal stenosis or cord compression. 4. Extensive epidural tumor in the lumbar spine is greatest in thickness at L1 without high-grade spinal canal stenosis at this level; however, superimposed on pre-existing degenerative changes lower in the lumbar spine results in severe spinal canal stenosis with cauda equina nerve root compression at L3-L4 and L4-L5. Multilevel neural foraminal stenosis is detailed above. 5. Postsurgical changes reflecting C6 laminectomy unchanged spinal canal stenosis compared to the postoperative study from 1 day prior.No definite cord signal abnormality. 6. Extramedullary fluid collection along the dorsal aspect of the cord beginning at T9-T10 extending into the lumbar spine is likely subdural in location but is of uncertain etiology; evolving blood products not excluded. The collection anteriorly displaces the cord and cauda equina nerve roots without frank cord compression or signal abnormality. There is no peripheral enhancement to  suggest abscess. 7. Prevertebral edema in the cervical spine which may be postsurgical in nature. 8. New bilateral lower lobe consolidations may reflect atelectasis or developing pneumonia/aspiration.   10/08/2021 Imaging   MRI brain with and without contrast showed 1. 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 There is intracranial extension of tumor along the right frontal convexity measuring up to 5 mm in thickness without mass effect on the underlying brain parenchyma or midline shift. Extracranial extension of tumor in this location measures up to approximately 3 mm. 2. Additional extracranial extension of tumor into the scalp along the vertex measuring up to 7 mm in thickness. 3. Small focus of diffusion restriction in the right parietal cortex most in keeping with a small acute infarct. 4. No evidence of parenchymal metastatic disease.   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 10/06/2021, patient underwent C6 cervical laminectomy and resection of tumor.  Pathology came back positive for metastatic prostate cancer. 8/14 developed significant bilateral upper and lower extremity weakness with hypotension requiring transfer to the ICU, pressors, steroid, respiratory failure on mechanical ventilation. Later patient was stabilized and extubated.  8/16 Firmagon loading dose 240mg   9/15 Eligard 22.5mg  given inpatient during his rehab admission.     10/2021 -  Radiation Therapy   Palliative radiation to spine.    12/04/2021 Tumor Marker   PSA 67.09   01/13/2022 Tumor Marker   PSA 43.95   01/23/2022 -  Chemotherapy   Started on Xtandi 160mg  daily.     INTERVAL HISTORY JESS TONEY is  a 69 y.o. male who has above history reviewed by me today presents for follow up visit for metastatic prostate cancer. Patient was accompanied  by wife.  Patient has chronic bilateral lower extremity extremity weakness, generalized left upper extremity weakness.  Marland Kitchen  He follows up with neurology.He continues physical therapy and has made some progress.  + fatigued, brain fog, fatigue, unchanged.  He has loose BM episodes last week, symptoms have improved.  MEDICAL HISTORY:  Past Medical History:  Diagnosis Date   Allergy    Arthritis    osteoarthritis   Cancer (HCC)    prostate   CHF (congestive heart failure) (HCC)    GERD (gastroesophageal reflux disease)    History of kidney stones    passed   Hypertension    Hypothyroidism    Palpitations 2022   Paralysis (HCC)    paraplegia 09/2021-surgery or stroke   Pneumonia    12/22/28- > 25 years ago   Stroke [facility])    Mini stroke - Aug 2023   Thyroid disease    Hyperthyroidism s/p radioactive iodine ablation    SURGICAL HISTORY: Past Surgical History:  Procedure Laterality Date   CARDIAC CATHETERIZATION  03/2000   HERNIA REPAIR  1961   umbilical   IR CATHETER TUBE CHANGE  12/12/2022   IR US GUIDE BX ASP/DRAIN  11/11/2022   LEFT HEART CATH AND CORONARY ANGIOGRAPHY Left 03/20/2020   Procedure: LEFT HEART CATH AND CORONARY ANGIOGRAPHY;  Surgeon: Yvonne Kendall, MD;  Location: [facility] INVASIVE CV LAB;  Service: Cardiovascular;  Laterality: Left;   POSTERIOR CERVICAL FUSION/FORAMINOTOMY N/A 10/06/2021   Procedure: POSTERIOR CERVICAL FUSION/ FORAMINOTOMY LEVEL 3;  Surgeon: Venetia Night, MD;  Location: [facility] ORS;  Service: Neurosurgery;  Laterality: N/A;  will need monitoring   RADIOLOGY WITH ANESTHESIA Left 04/18/2021   Procedure: MRI SHOULDER WITHOUT CONTRAST WITH ANESTHESIA;  Surgeon: Radiologist, Medication, MD;  Location: [facility] OR;  Service: Radiology;  Laterality: Left;   RADIOLOGY WITH ANESTHESIA N/A 10/03/2021   Procedure: MRI CERVICAL SPINE WITH ANESTHESIA;  Surgeon: Radiologist, Medication, MD;  Location: [facility] OR;  Service: Radiology;  Laterality: N/A;   RADIOLOGY WITH  ANESTHESIA N/A 01/30/2022   Procedure: MRI CERVICAL SPINE WITH AND WITOHOUT CONTRAST WITH ANESTHESIA;  Surgeon: Radiologist, Medication, MD;  Location: [facility] OR;  Service: Radiology;  Laterality: N/A;   RADIOLOGY WITH ANESTHESIA N/A 05/22/2022   Procedure: MRI LUMBER SPINE AND CERVIC SPINE WITH AND WITHOUT CONTRAST;  Surgeon: Radiologist, Medication, MD;  Location: [facility] OR;  Service: Radiology;  Laterality: N/A;   RADIOLOGY WITH ANESTHESIA N/A 12/25/2022   Procedure: MRI WITH ANESTHESIA OF CERVICAL SPINE WITH AND WITHOUT CONTRAST,CARDIAC MORPHOLOGY;  Surgeon: Radiologist, Medication, MD;  Location: [facility] OR;  Service: Radiology;  Laterality: N/A;   REVERSE SHOULDER ARTHROPLASTY Left    ROTATOR CUFF REPAIR  11/2010   Dr Hyacinth Meeker   SUPRAPUBIC CATHETER INSERTION  11/11/2022   TONSILLECTOMY     TOTAL HIP ARTHROPLASTY Right 02/24/2009   TOTAL HIP ARTHROPLASTY Left 06/2016   Dr Odis Luster    SOCIAL HISTORY: Social History   Socioeconomic History   Marital status: Married    Spouse name: [name]    Number of children: 0   Years of education: Not on file   Highest education level: Bachelor's degree (e.g., BA, AB, BS)  Occupational History   Occupation: Warden/ranger: [employer]  Tobacco Use   Smoking status: Never    Passive exposure: Never   Smokeless tobacco: Never  Vaping Use  Vaping status: Never Used  Substance and Sexual Activity   Alcohol use: Not Currently   Drug use: No   Sexual activity: Not Currently  Other Topics Concern   Not on file  Social History Narrative   Lives at home with spouse.    Social Drivers of Corporate investment banker Strain: Low Risk  (03/31/2023)   Overall Financial Resource Strain (CARDIA)    Difficulty of Paying Living Expenses: Not hard at all  Food Insecurity: No Food Insecurity (03/31/2023)   Hunger Vital Sign    Worried About Running Out of Food in the Last Year: Never true    Ran Out of Food in the Last Year: Never true  Transportation  Needs: No Transportation Needs (03/31/2023)   PRAPARE - Administrator, Civil Service (Medical): No    Lack of Transportation (Non-Medical): No  Physical Activity: Insufficiently Active (03/31/2023)   Exercise Vital Sign    Days of Exercise per Week: 3 days    Minutes of Exercise per Session: 30 min  Stress: No Stress Concern Present (03/31/2023)   [employer] of Occupational Health - Occupational Stress Questionnaire    Feeling of Stress : Only a little  Social Connections: Moderately Integrated (03/31/2023)   Social Connection and Isolation Panel [NHANES]    Frequency of Communication with Friends and Family: Three times a week    Frequency of Social Gatherings with Friends and Family: Three times a week    Attends Religious Services: Never    Active Member of Clubs or Organizations: Yes    Attends Banker Meetings: 1 to 4 times per year    Marital Status: Married  Catering manager Violence: Not on file    FAMILY HISTORY: Family History  Problem Relation Age of Onset   Hypertension Mother    Stroke Mother    Gout Father    Heart attack Father 11   Heart disease Sister    Lung cancer Sister    Drug abuse Sister    Breast cancer Paternal Aunt    Lung cancer Maternal Grandmother    Colon cancer Neg Hx    Esophageal cancer Neg Hx    Rectal cancer Neg Hx    Stomach cancer Neg Hx     ALLERGIES:  has no known allergies.  MEDICATIONS:  Current Outpatient Medications  Medication Sig Dispense Refill   acetaminophen (TYLENOL) 500 MG tablet Take 1,000 mg by mouth 2 (two) times daily.     ascorbic acid (VITAMIN C) 500 MG tablet Take 1 tablet (500 mg total) by mouth daily. 30 tablet 0   aspirin EC 81 MG tablet Take 1 tablet (81 mg total) by mouth daily. Swallow whole. 30 tablet 12   atorvastatin (LIPITOR) 40 MG tablet Take 1 tablet (40 mg total) by mouth daily. 90 tablet 3   baclofen (LIORESAL) 10 MG tablet Take 0.5-1 tablets (5-10 mg total) by mouth 2 (two)  times daily as needed for muscle spasms. 60 each 5   cetirizine (ZYRTEC) 10 MG tablet Take 10 mg by mouth at bedtime.     clonazePAM (KLONOPIN) 0.5 MG tablet Take 1 tablet by mouth twice daily 180 tablet 0   CRANBERRY PO Take 1 tablet by mouth 2 (two) times daily.     famotidine (PEPCID) 20 MG tablet Take 1 tablet (20 mg total) by mouth daily. 30 tablet 0   FLUoxetine (PROZAC) 20 MG capsule Take 1 capsule (20 mg total) by mouth at  bedtime. 90 capsule 3   fluticasone (FLONASE) 50 MCG/ACT nasal spray Place 1 spray into both nostrils daily. 48 g 3   guaiFENesin (MUCINEX) 600 MG 12 hr tablet Take 600 mg by mouth 2 (two) times daily.     levothyroxine (SYNTHROID) 88 MCG tablet Take 1 tablet (88 mcg total) by mouth daily. 90 tablet 3   metoprolol succinate (TOPROL-XL) 25 MG 24 hr tablet Take 1 tablet (25 mg total) by mouth at bedtime. Take with or immediately following a meal. 90 tablet 3   Multiple Vitamin (MULTIVITAMIN) tablet Take 1 tablet by mouth daily.     potassium chloride SA (KLOR-CON M) 20 MEQ tablet Take 1 tablet (20 mEq total) by mouth at bedtime. 90 tablet 3   sacubitril-valsartan (ENTRESTO) 24-26 MG Take 1 tablet by mouth 2 (two) times daily. 28 tablet 0   sennosides-docusate sodium (SENOKOT-S) 8.6-50 MG tablet Take 2 tablets by mouth daily. 60 tablet 11   silver sulfADIAZINE (SILVADENE) 1 % cream Apply 1 Application topically daily. 50 g 1   amoxicillin (AMOXIL) 500 MG capsule Take 2,000 mg by mouth See admin instructions. Take 2000 mg 1 hour prior to dental work     darolutamide (NUBEQA) 300 MG tablet Take 2 tablets (600 mg total) by mouth 2 (two) times daily with a meal. 120 tablet 2   nystatin cream (MYCOSTATIN) Apply 1 Application topically 2 (two) times daily. (Patient not taking: Reported on 05/08/2023) 60 g 1   spironolactone (ALDACTONE) 25 MG tablet Take 0.5 tablets (12.5 mg total) by mouth daily. 45 tablet 3   No current facility-administered medications for this visit.     Review of Systems  Constitutional:  Positive for fatigue. Negative for appetite change and chills.  HENT:   Negative for hearing loss.   Eyes:  Negative for icterus.  Respiratory:  Negative for cough and shortness of breath.   Cardiovascular:  Negative for chest pain.  Gastrointestinal:  Negative for abdominal pain and blood in stool.  Genitourinary:         + Foley catheter  Musculoskeletal:  Negative for back pain.  Skin:  Negative for rash.  Neurological:  Positive for extremity weakness.     PHYSICAL EXAMINATION: ECOG PERFORMANCE STATUS: 3 - Symptomatic, >50% confined to bed  Vitals:   05/08/23 1100  BP: 126/66  Pulse: (!) 48  Resp: 18  Temp: (!) 97.2 F (36.2 C)   Filed Weights   05/08/23 1100  Weight: 155 lb (70.3 kg)       Physical Exam Constitutional:      General: He is not in acute distress.    Appearance: He is not diaphoretic.     Comments: Patient sits in wheelchair.  HENT:     Head: Normocephalic and atraumatic.     Nose: Nose normal.     Mouth/Throat:     Pharynx: No oropharyngeal exudate.  Eyes:     General: No scleral icterus. Cardiovascular:     Rate and Rhythm: Normal rate.  Pulmonary:     Effort: Pulmonary effort is normal. No respiratory distress.  Abdominal:     General: There is no distension.  Musculoskeletal:        General: Normal range of motion.     Cervical back: Normal range of motion.  Skin:    General: Skin is warm and dry.     Findings: No erythema.  Neurological:     Mental Status: He is alert and oriented to person, place,  and time.     Cranial Nerves: No cranial nerve deficit.     Motor: No abnormal muscle tone.     Comments: Quadriplegia  Psychiatric:        Mood and Affect: Mood and affect normal.      LABORATORY DATA:  I have reviewed the data as listed    Latest Ref Rng & Units 05/08/2023   10:46 AM 02/06/2023   10:43 AM 12/25/2022    9:01 AM  CBC  WBC 4.0 - 10.5 K/uL 4.2  4.3  6.4   Hemoglobin  13.0 - 17.0 g/dL 16.1  09.6  04.5   Hematocrit 39.0 - 52.0 % 37.7  37.4  41.4   Platelets 150 - 400 K/uL 193  261  257       Latest Ref Rng & Units 05/08/2023   10:46 AM 02/06/2023   10:43 AM 12/12/2022    2:32 PM  CMP  Glucose 70 - 99 mg/dL 79  88  409   BUN 8 - 23 mg/dL 11  9  12    Creatinine 0.61 - 1.24 mg/dL 8.11  9.14  7.82   Sodium 135 - 145 mmol/L 137  138  141   Potassium 3.5 - 5.1 mmol/L 4.1  4.4  4.0   Chloride 98 - 111 mmol/L 101  102  99   CO2 22 - 32 mmol/L 29  28  28    Calcium 8.9 - 10.3 mg/dL 8.9  9.0  9.7   Total Protein 6.5 - 8.1 g/dL 6.2  6.2    Total Bilirubin 0.0 - 1.2 mg/dL 1.1  0.9    Alkaline Phos 38 - 126 U/L 85  91    AST 15 - 41 U/L 39  23    ALT 0 - 44 U/L 44  22       RADIOGRAPHIC STUDIES: I have personally reviewed the radiological images as listed and agreed with the findings in the report. No results found.

## 2023-05-08 NOTE — Assessment & Plan Note (Signed)
 Provoked, continue Aspirin 162 mg daily for prophylaxis.

## 2023-05-08 NOTE — Patient Instructions (Addendum)
 Medication Instructions:  START:  Spironolactone 12.5 mg daily   *If you need a refill on your cardiac medications before your next appointment, please call your pharmacy*  Labs: Your provider would like for you to return in 2 weeks to have the following labs drawn: BMET.   Please go to [facility] [address] ([facility], [place] [address] You do not need an appointment.  They are open from 8 am- 4:30 pm.  Lunch from 1:00 pm- 2:00 pm You do not need to be fasting.   You may also go to one of the following [facility]:  [address] [place], [place] [address] Phone: [phone] Lab hours: Mon-Fri 8 am- 5 pm    Lunch 12 pm- 1 pm  [address] [place],  [place]  [address]  [place] Phone: [phone] Lab hours: 7 am- 4 pm Lunch 12 pm-1 pm   [address] [place],  [place]  [address]  [place] Phone: [phone] Lab hours: Mon-Fri 8 am- 5 pm    Lunch 12 pm- 1 pm   Follow-Up: At [facility], you and your health needs are our priority.  As part of our continuing mission to provide you with exceptional heart care, we have created designated Provider Care Teams.  These Care Teams include your primary Cardiologist (physician) and Advanced Practice Providers (APPs -  Physician Assistants and Nurse Practitioners) who all work together to provide you with the care you need, when you need it.  We recommend signing up for the patient portal called "MyChart".  Sign up information is provided on this After Visit Summary.  MyChart is used to connect with patients for Virtual Visits (Telemedicine).  Patients are able to view lab/test results, encounter notes, upcoming appointments, etc.  Non-urgent messages can be sent to your provider as well.   To learn more about what you can do with MyChart, go to [email or web address].    Your next appointment:   4 month(s)  Provider:   Yvonne Kendall, MD

## 2023-05-08 NOTE — Assessment & Plan Note (Signed)
 On Eligard 45mg  Q6 months. Next dose due June 2025

## 2023-05-10 ENCOUNTER — Encounter: Payer: Self-pay | Admitting: Oncology

## 2023-05-14 ENCOUNTER — Other Ambulatory Visit: Payer: Self-pay | Admitting: Pharmacist

## 2023-05-14 DIAGNOSIS — C61 Malignant neoplasm of prostate: Secondary | ICD-10-CM

## 2023-05-14 MED ORDER — NUBEQA 300 MG PO TABS: 600.0000 mg | ORAL_TABLET | Freq: Two times a day (BID) | ORAL | 2 refills | Status: DC

## 2023-05-15 ENCOUNTER — Telehealth: Payer: Self-pay

## 2023-05-15 NOTE — Telephone Encounter (Signed)
 Left detailed message on verified VM for Dani with Dr Karle Starch message.

## 2023-05-15 NOTE — Telephone Encounter (Signed)
 Copied from CRM [id number]. Topic: Clinical - Medical Advice >> May 15, 2023  9:15 AM Tiffany S wrote: Reason for CRM: Dani from [facility] called patient has pressure ulcer on sacrum (dime size between the stages of 1 and 2)  Dani is asking if its okay to apply a Xero form to the wound. Requesting call back   [phone]

## 2023-05-22 ENCOUNTER — Other Ambulatory Visit: Payer: Self-pay

## 2023-05-22 DIAGNOSIS — I5022 Chronic systolic (congestive) heart failure: Secondary | ICD-10-CM

## 2023-05-23 LAB — BASIC METABOLIC PANEL WITH GFR
BUN/Creatinine Ratio: 29 — ABNORMAL HIGH (ref 10–24)
BUN: 14 mg/dL (ref 8–27)
CO2: 26 mmol/L (ref 20–29)
Calcium: 8.9 mg/dL (ref 8.6–10.2)
Chloride: 102 mmol/L (ref 96–106)
Creatinine, Ser: 0.49 mg/dL — ABNORMAL LOW (ref 0.76–1.27)
Glucose: 76 mg/dL (ref 70–99)
Potassium: 4 mmol/L (ref 3.5–5.2)
Sodium: 141 mmol/L (ref 134–144)
eGFR: 114 mL/min/{1.73_m2} (ref >59)

## 2023-05-25 ENCOUNTER — Other Ambulatory Visit: Payer: Self-pay

## 2023-05-25 DIAGNOSIS — Z79899 Other long term (current) drug therapy: Secondary | ICD-10-CM

## 2023-05-26 ENCOUNTER — Telehealth: Payer: Self-pay

## 2023-05-26 ENCOUNTER — Encounter: Payer: Self-pay | Admitting: Internal Medicine

## 2023-05-26 NOTE — Telephone Encounter (Signed)
 Copied from CRM [id number]. Topic: Clinical - Home Health Verbal Orders >> May 26, 2023 11:28 AM Orson Gear wrote: Caller/Agency: Thayer Ohm with [facility] Callback Number: [phone] Service Requested: Physical Therapy Frequency: one week 1, two week 1, one week 4 Any new concerns about the patient? No

## 2023-05-26 NOTE — Telephone Encounter (Signed)
 Called and advised Thayer Ohm with [facility]  of the approval of the requested verbal orders for this patient. Advised to call back with any further questions.

## 2023-06-03 ENCOUNTER — Ambulatory Visit (INDEPENDENT_AMBULATORY_CARE_PROVIDER_SITE_OTHER): Admitting: Internal Medicine

## 2023-06-03 ENCOUNTER — Encounter: Payer: Self-pay | Admitting: Internal Medicine

## 2023-06-03 VITALS — BP 132/78 | HR 66 | Resp 14

## 2023-06-03 DIAGNOSIS — G8220 Paraplegia, unspecified: Secondary | ICD-10-CM

## 2023-06-03 DIAGNOSIS — Z960 Presence of urogenital implants: Secondary | ICD-10-CM

## 2023-06-03 DIAGNOSIS — C61 Malignant neoplasm of prostate: Secondary | ICD-10-CM

## 2023-06-03 DIAGNOSIS — Z9359 Other cystostomy status: Secondary | ICD-10-CM | POA: Diagnosis not present

## 2023-06-03 DIAGNOSIS — I5020 Unspecified systolic (congestive) heart failure: Secondary | ICD-10-CM

## 2023-06-03 DIAGNOSIS — C7940 Secondary malignant neoplasm of unspecified part of nervous system: Secondary | ICD-10-CM

## 2023-06-03 DIAGNOSIS — I110 Hypertensive heart disease with heart failure: Secondary | ICD-10-CM

## 2023-06-03 DIAGNOSIS — F39 Unspecified mood [affective] disorder: Secondary | ICD-10-CM

## 2023-06-03 DIAGNOSIS — Z7982 Long term (current) use of aspirin: Secondary | ICD-10-CM

## 2023-06-03 DIAGNOSIS — K219 Gastro-esophageal reflux disease without esophagitis: Secondary | ICD-10-CM

## 2023-06-03 DIAGNOSIS — I5022 Chronic systolic (congestive) heart failure: Secondary | ICD-10-CM

## 2023-06-03 DIAGNOSIS — E039 Hypothyroidism, unspecified: Secondary | ICD-10-CM

## 2023-06-03 DIAGNOSIS — F32A Depression, unspecified: Secondary | ICD-10-CM

## 2023-06-03 DIAGNOSIS — Z556 Problems related to health literacy: Secondary | ICD-10-CM

## 2023-06-03 DIAGNOSIS — M1990 Unspecified osteoarthritis, unspecified site: Secondary | ICD-10-CM

## 2023-06-03 NOTE — Assessment & Plan Note (Signed)
 Borderline low EF now Doing well on metoprolol 25, entresto 24/26 bid and now spironolactone 12.5 mg daily

## 2023-06-03 NOTE — Progress Notes (Signed)
 Subjective:    Patient ID: Jason [name], male    DOB: [birth year], 68 y.o.   MRN: [id number]  HPI Home visit for follow up of multiple medical issues and home bound status Wife is here as usual  Doing well Continues with PT and is making some progress Able to stand with walker and minor assist--then take a few steps, but with substantial assist Working on leg strength---especially right which is the weaker one Waiting for a new OT---but is able to eat, brush teeth, comb hair on his own still  No problems with suprapubic catheter Goes to urology office monthly for it to be changed  Recent oncology visit PSA stable at 3.75 On nebeqa still ----and every 6 month lupron injection  Recent cardiology visit No fluid retention No chest pain or SOB Sleeps with slight incline--no PND Now on low dose spironolactone---follow up renal profile was fine  Same bowel regimen Senna and dulcolax suppository in evening (with overnight stool)---wife cleans in the morning  Sacral ulcer is better per wife (She shows me picture)  Current Outpatient Medications on File Prior to Visit  Medication Sig Dispense Refill   acetaminophen (TYLENOL) 500 MG tablet Take 1,000 mg by mouth 2 (two) times daily.     amoxicillin (AMOXIL) 500 MG capsule Take 2,000 mg by mouth See admin instructions. Take 2000 mg 1 hour prior to dental work     ascorbic acid (VITAMIN C) 500 MG tablet Take 1 tablet (500 mg total) by mouth daily. 30 tablet 0   aspirin EC 81 MG tablet Take 1 tablet (81 mg total) by mouth daily. Swallow whole. 30 tablet 12   atorvastatin (LIPITOR) 40 MG tablet Take 1 tablet (40 mg total) by mouth daily. 90 tablet 3   baclofen (LIORESAL) 10 MG tablet Take 0.5-1 tablets (5-10 mg total) by mouth 2 (two) times daily as needed for muscle spasms. 60 each 5   cetirizine (ZYRTEC) 10 MG tablet Take 10 mg by mouth at bedtime.     clonazePAM (KLONOPIN) 0.5 MG tablet Take 1 tablet by mouth twice daily 180 tablet  0   CRANBERRY PO Take 1 tablet by mouth 2 (two) times daily.     darolutamide (NUBEQA) 300 MG tablet Take 2 tablets (600 mg total) by mouth 2 (two) times daily with a meal. 120 tablet 2   famotidine (PEPCID) 20 MG tablet Take 1 tablet (20 mg total) by mouth daily. 30 tablet 0   FLUoxetine (PROZAC) 20 MG capsule Take 1 capsule (20 mg total) by mouth at bedtime. 90 capsule 3   fluticasone (FLONASE) 50 MCG/ACT nasal spray Place 1 spray into both nostrils daily. 48 g 3   guaiFENesin (MUCINEX) 600 MG 12 hr tablet Take 600 mg by mouth 2 (two) times daily.     levothyroxine (SYNTHROID) 88 MCG tablet Take 1 tablet (88 mcg total) by mouth daily. 90 tablet 3   metoprolol succinate (TOPROL-XL) 25 MG 24 hr tablet Take 1 tablet (25 mg total) by mouth at bedtime. Take with or immediately following a meal. 90 tablet 3   Multiple Vitamin (MULTIVITAMIN) tablet Take 1 tablet by mouth daily.     nystatin cream (MYCOSTATIN) Apply 1 Application topically 2 (two) times daily. (Patient not taking: Reported on 05/08/2023) 60 g 1   potassium chloride SA (KLOR-CON M) 20 MEQ tablet Take 1 tablet (20 mEq total) by mouth at bedtime. 90 tablet 3   sacubitril-valsartan (ENTRESTO) 24-26 MG Take 1 tablet by mouth  2 (two) times daily. 28 tablet 0   sennosides-docusate sodium (SENOKOT-S) 8.6-50 MG tablet Take 2 tablets by mouth daily. 60 tablet 11   silver sulfADIAZINE (SILVADENE) 1 % cream Apply 1 Application topically daily. 50 g 1   spironolactone (ALDACTONE) 25 MG tablet Take 0.5 tablets (12.5 mg total) by mouth daily. 45 tablet 3   No current facility-administered medications on file prior to visit.    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 stroke   Pneumonia    12/22/28- > 25 years ago   Stroke [facility])    Mini stroke - Aug 2023   Thyroid disease    Hyperthyroidism s/p radioactive iodine ablation    Past Surgical History:  Procedure Laterality Date   CARDIAC CATHETERIZATION  03/2000   HERNIA REPAIR  1961   umbilical   IR CATHETER TUBE CHANGE  12/12/2022   IR US GUIDE BX ASP/DRAIN  11/11/2022   LEFT HEART CATH AND CORONARY ANGIOGRAPHY Left 03/20/2020   Procedure: LEFT HEART CATH AND CORONARY ANGIOGRAPHY;  Surgeon: Yvonne Kendall, MD;  Location: [facility] INV[facility]IVE CV [facility];  Service: Cardiovascular;  Laterality: Left;   POSTERIOR CERVICAL FUSION/FORAMINOTOMY N/A 10/06/2021   Procedure: POSTERIOR CERVICAL FUSION/ FORAMINOTOMY LEVEL 3;  Surgeon: Venetia Night, MD;  Location: [facility] ORS;  Service: Neurosurgery;  Laterality: N/A;  will need monitoring   RADIOLOGY WITH ANESTHESIA Left 04/18/2021   Procedure: MRI SHOULDER WITHOUT CONTRAST WITH ANESTHESIA;  Surgeon: Radiologist, Medication, MD;  Location: [facility] OR;  Service: Radiology;  Laterality: Left;   RADIOLOGY WITH ANESTHESIA N/A 10/03/2021   Procedure: MRI CERVICAL SPINE WITH ANESTHESIA;  Surgeon: Radiologist, Medication, MD;  Location: [facility] OR;  Service: Radiology;  Laterality: N/A;   RADIOLOGY WITH ANESTHESIA N/A 01/30/2022   Procedure: MRI CERVICAL SPINE WITH AND WITOHOUT CONTRAST WITH ANESTHESIA;  Surgeon: Radiologist, Medication, MD;  Location: [facility] OR;  Service: Radiology;  Laterality: N/A;   RADIOLOGY WITH ANESTHESIA N/A 05/22/2022   Procedure: MRI LUMBER SPINE AND CERVIC SPINE WITH AND WITHOUT CONTRAST;  Surgeon: Radiologist, Medication, MD;  Location: [facility] OR;  Service: Radiology;  Laterality: N/A;   RADIOLOGY WITH ANESTHESIA N/A 12/25/2022   Procedure: MRI WITH ANESTHESIA OF CERVICAL SPINE WITH AND WITHOUT CONTRAST,CARDIAC MORPHOLOGY;  Surgeon: Radiologist, Medication, MD;  Location: [facility] OR;  Service: Radiology;  Laterality: N/A;   REVERSE  SHOULDER ARTHROPLASTY Left    ROTATOR CUFF REPAIR  11/2010   Dr Hyacinth Meeker   SUPRAPUBIC CATHETER INSERTION  11/11/2022   TONSILLECTOMY     TOTAL HIP ARTHROPLASTY Right 02/24/2009   TOTAL HIP ARTHROPLASTY Left 06/2016   Dr Odis Luster    Family History  Problem Relation Age of Onset   Hypertension Mother    Stroke Mother    Gout Father    Heart attack Father 78   Heart disease Sister    Lung cancer Sister    Drug abuse Sister    Breast cancer Paternal Aunt    Lung cancer Maternal Grandmother    Colon cancer Neg Hx    Esophageal cancer Neg Hx    Rectal cancer Neg Hx  Stomach cancer Neg Hx     Social History   Socioeconomic History   Marital status: Married    Spouse name: [name]    Number of children: 0   Years of education: Not on file   Highest education level: Bachelor's degree (e.g., BA, AB, BS)  Occupational History   Occupation: Warden/ranger: [employer]  Tobacco Use   Smoking status: Never    Passive exposure: Never   Smokeless tobacco: Never  Vaping Use   Vaping status: Never Used  Substance and Sexual Activity   Alcohol use: Not Currently   Drug use: No   Sexual activity: Not Currently  Other Topics Concern   Not on file  Social History Narrative   Lives at home with spouse.    Social Drivers of Corporate investment banker Strain: Low Risk  (03/31/2023)   Overall Financial Resource Strain (CARDIA)    Difficulty of Paying Living Expenses: Not hard at all  Food Insecurity: No Food Insecurity (03/31/2023)   Hunger Vital Sign    Worried About Running Out of Food in the Last Year: Never true    Ran Out of Food in the Last Year: Never true  Transportation Needs: No Transportation Needs (03/31/2023)   PRAPARE - Administrator, Civil Service (Medical): No    Lack of Transportation (Non-Medical): No  Physical Activity: Insufficiently Active (03/31/2023)   Exercise Vital Sign    Days of Exercise per Week: 3 days    Minutes of Exercise per  Session: 30 min  Stress: No Stress Concern Present (03/31/2023)   [employer] of Occupational Health - Occupational Stress Questionnaire    Feeling of Stress : Only a little  Social Connections: Moderately Integrated (03/31/2023)   Social Connection and Isolation Panel [NHANES]    Frequency of Communication with Friends and Family: Three times a week    Frequency of Social Gatherings with Friends and Family: Three times a week    Attends Religious Services: Never    Active Member of Clubs or Organizations: Yes    Attends Banker Meetings: 1 to 4 times per year    Marital Status: Married  Catering manager Violence: Not on file       Review of Systems Sleeps pretty well Some leg cramps---baclofen helps    Objective:   Physical Exam Constitutional:      Appearance: Normal appearance.  Cardiovascular:     Rate and Rhythm: Normal rate and regular rhythm.     Heart sounds:     No gallop.     Comments: Soft systolic murmur Pulmonary:     Effort: Pulmonary effort is normal.     Breath sounds: Normal breath sounds. No wheezing or rales.  Abdominal:     Palpations: Abdomen is soft.     Tenderness: There is no abdominal tenderness.  Musculoskeletal:     Cervical back: Neck supple.     Right lower leg: No edema.     Left lower leg: No edema.  Lymphadenopathy:     Cervical: No cervical adenopathy.  Skin:    Comments: Granulated sacral ulcer Mild perirectal redness  Neurological:     Mental Status: He is alert.            Assessment & Plan:

## 2023-06-03 NOTE — Assessment & Plan Note (Signed)
 No problems with this Gets changed monthly at urology office

## 2023-06-03 NOTE — Assessment & Plan Note (Signed)
 Doing well with nubeqa and every 6 months lupron

## 2023-06-03 NOTE — Assessment & Plan Note (Signed)
 Reasonable arm function now for simple tasks Slow improvement in legs---now able to stand with assist at walker and take some steps with maximal assist. Not able to pivot as yet--mostly due to right leg still very weak

## 2023-06-03 NOTE — Assessment & Plan Note (Signed)
 Is adjusting--especially with small gains in function On the fluoxetine

## 2023-06-05 ENCOUNTER — Ambulatory Visit (INDEPENDENT_AMBULATORY_CARE_PROVIDER_SITE_OTHER): Admitting: Physician Assistant

## 2023-06-05 ENCOUNTER — Encounter: Payer: Self-pay | Admitting: Physician Assistant

## 2023-06-05 ENCOUNTER — Ambulatory Visit: Admitting: Physician Assistant

## 2023-06-05 DIAGNOSIS — Z435 Encounter for attention to cystostomy: Secondary | ICD-10-CM

## 2023-06-05 NOTE — Progress Notes (Signed)
 Suprapubic Cath Change  Patient is present today for a suprapubic catheter change due to urinary retention.  8ml of water was drained from the balloon, a 16FR foley cath was removed from the tract without difficulty.  Site was cleaned and prepped in a sterile fashion with betadine.  A 16FR foley cath was replaced into the tract no complications were noted. Urine return was noted, 10 ml of sterile water was inflated into the balloon and a night bag was attached for drainage.  Patient tolerated well.  Performed by: Carman Ching, PA-C   Follow up: Return in about 4 weeks (around 07/03/2023) for SPT exchange.

## 2023-06-08 ENCOUNTER — Ambulatory Visit: Admitting: Physician Assistant

## 2023-06-18 ENCOUNTER — Telehealth: Payer: Self-pay

## 2023-06-18 NOTE — Telephone Encounter (Signed)
 Called and gave verbal orders to nurse that answered.

## 2023-06-18 NOTE — Telephone Encounter (Signed)
 Copied from CRM [id number]. Topic: Clinical - Home Health Verbal Orders >> Jun 18, 2023 11:04 AM Orien Bird wrote: Caller/Agency: [facility]  Callback Number: [phone] (option 2)  Service Requested: Skilled Nursing and Occupational Therapy  Frequency: 1 week 1  Any new concerns about the patient? No

## 2023-07-10 ENCOUNTER — Ambulatory Visit (INDEPENDENT_AMBULATORY_CARE_PROVIDER_SITE_OTHER): Admitting: Physician Assistant

## 2023-07-10 VITALS — BP 107/66 | HR 61

## 2023-07-10 DIAGNOSIS — Z435 Encounter for attention to cystostomy: Secondary | ICD-10-CM

## 2023-07-10 NOTE — Progress Notes (Signed)
 Suprapubic Cath Change  Patient is present today for a suprapubic catheter change due to urinary retention.  8ml of water was drained from the balloon, a 16FR foley cath was removed from the tract with out difficulty.  Site was cleaned and prepped in a sterile fashion with betadine.  A 16FR Silastic foley cath was replaced into the tract no complications were noted. Urine return was noted, 10 ml of sterile water was inflated into the balloon and a night bag was attached for drainage.  Patient tolerated well.    Performed by: Silvestre Mines, PA-C   Follow up: Return in about 4 weeks (around 08/07/2023) for SPT exchange.

## 2023-07-11 LAB — BASIC METABOLIC PANEL WITH GFR
BUN/Creatinine Ratio: 21 (ref 10–24)
BUN: 9 mg/dL (ref 8–27)
CO2: 24 mmol/L (ref 20–29)
Calcium: 8.5 mg/dL — ABNORMAL LOW (ref 8.6–10.2)
Chloride: 94 mmol/L — ABNORMAL LOW (ref 96–106)
Creatinine, Ser: 0.43 mg/dL — ABNORMAL LOW (ref 0.76–1.27)
Glucose: 78 mg/dL (ref 70–99)
Potassium: 4 mmol/L (ref 3.5–5.2)
Sodium: 130 mmol/L — ABNORMAL LOW (ref 134–144)
eGFR: 118 mL/min/{1.73_m2} (ref >59)

## 2023-07-13 ENCOUNTER — Ambulatory Visit: Payer: Self-pay | Admitting: Internal Medicine

## 2023-07-13 ENCOUNTER — Telehealth: Payer: Self-pay | Admitting: Medical

## 2023-07-13 DIAGNOSIS — Z79899 Other long term (current) drug therapy: Secondary | ICD-10-CM

## 2023-07-13 DIAGNOSIS — I5022 Chronic systolic (congestive) heart failure: Secondary | ICD-10-CM

## 2023-07-13 DIAGNOSIS — I5020 Unspecified systolic (congestive) heart failure: Secondary | ICD-10-CM

## 2023-07-13 NOTE — Telephone Encounter (Signed)
 Wife is returning call to discuss lab results.

## 2023-07-13 NOTE — Telephone Encounter (Signed)
 Spoke with patients wife per release form. Reviewed results and provider recommendations. She verbalized understanding with no further questions. Lab orders placed and released.      Jason Crisp, MD 07/13/2023  6:54 AM EDT     Please let Jason [name] know that his sodium has dropped over the last month.  I worry that this could be due to spironolactone  and I recommend stopping this medication and repeating a BMP in ~2 weeks.

## 2023-07-21 ENCOUNTER — Telehealth: Payer: Self-pay | Admitting: Internal Medicine

## 2023-07-21 ENCOUNTER — Other Ambulatory Visit: Payer: Self-pay | Admitting: Pharmacist

## 2023-07-21 ENCOUNTER — Other Ambulatory Visit: Payer: Self-pay | Admitting: Internal Medicine

## 2023-07-21 DIAGNOSIS — C61 Malignant neoplasm of prostate: Secondary | ICD-10-CM

## 2023-07-21 MED ORDER — NUBEQA 300 MG PO TABS: 600.0000 mg | ORAL_TABLET | Freq: Two times a day (BID) | ORAL | 3 refills | Status: DC

## 2023-07-21 NOTE — Telephone Encounter (Signed)
 Last filled 04-16-23 #180 Last Home Visit 06-03-23 [facility] [address]

## 2023-07-21 NOTE — Telephone Encounter (Signed)
 We can approve verbal orders.  It will not let me send it to the [place] pool

## 2023-07-21 NOTE — Telephone Encounter (Signed)
Verbal orders given to [name] 

## 2023-07-21 NOTE — Telephone Encounter (Signed)
 Copied from CRM [id number]. Topic: Clinical - Home Health Verbal Orders >> Jul 21, 2023  9:14 AM Alpha Arts wrote: Caller/Agency: [facility] Callback Number: [phone] Service Requested: Physical Therapy Frequency: 1x 9weeks Any new concerns about the patient? No

## 2023-07-24 ENCOUNTER — Encounter: Payer: Self-pay | Admitting: Physical Medicine and Rehabilitation

## 2023-07-24 ENCOUNTER — Encounter
Payer: Medicare Other | Attending: Physical Medicine and Rehabilitation | Admitting: Physical Medicine and Rehabilitation

## 2023-07-24 VITALS — BP 98/61 | HR 58 | Ht 69.0 in | Wt 155.0 lb

## 2023-07-24 DIAGNOSIS — K592 Neurogenic bowel, not elsewhere classified: Secondary | ICD-10-CM | POA: Diagnosis present

## 2023-07-24 DIAGNOSIS — Z9359 Other cystostomy status: Secondary | ICD-10-CM | POA: Diagnosis present

## 2023-07-24 DIAGNOSIS — F39 Unspecified mood [affective] disorder: Secondary | ICD-10-CM | POA: Diagnosis present

## 2023-07-24 DIAGNOSIS — R252 Cramp and spasm: Secondary | ICD-10-CM | POA: Diagnosis present

## 2023-07-24 DIAGNOSIS — Z993 Dependence on wheelchair: Secondary | ICD-10-CM | POA: Diagnosis not present

## 2023-07-24 DIAGNOSIS — G8250 Quadriplegia, unspecified: Secondary | ICD-10-CM | POA: Insufficient documentation

## 2023-07-24 NOTE — Progress Notes (Signed)
 Subjective:    Patient ID: Jason [name], male    DOB: 08-27-1955, 68 y.o.   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      Things are good!  Pain is less-  Butt was hurting- so pumped up ROHO too much But pressure ulcer is almost healed- that's where he feels the pain.   Basically just using Barrier cream- petroleum jelly and a dry dressing- no adhesives  Antimonkey butt cream- from [employer]-  For yeast infection. Is clearing so doesn't need Diflucan  to heal it per wife.   Taking Senna at lunch time- down from 2 pills/day.   Using poopapalooza- liquid-y poop-  Surrounds penis and everywhere- since looser, so went down to 1 senna and now more firm and now only in back.  Having regular BM's- occurring while asleep in middle of night.   Doing bowel program in evening and having BM overnight-  Not destroying skin now.   Has a good H/H nurse- doesn't use the same product- so it helps it heal better.    Spasticity -doing 5 mg initially at night Only has spasms at night. Can barely hear his feet moving much at all anymore per wife.  Feels like spasticity a lot better with Baclofen .  So increased to 10 mg at bedtime-  so only 1 tabs nightly Now sleeping fine, when before it was interfering with sleep.    Got PRAFO's- wearing them nightly- helping ankles.    Standing with PT-  with RW-  Working on transferring! Also working on standing with no RW- holding it up in air, or not holding onto it- was 3-4 minutes he thinks last time.   Standing with wife- pulls self up- with gait belt- at counter/sink- goes up and stands, then  controlled descent.  4x today.  Works with PTA 3 weeks/month and PT 4th week.  1x/week right  now  Neighbors comes over 8am and 5pm daily.   Has to change brief a lot after standing.    Not getting OT anymore.  Doesn't miss it- new OT wasn't coming when planned.  Nursing has signed off on him- and LPN asked them to call her directly.    Was given Klonopin - has had for awhile- 2x/day- scheduled-  Mood average-  Not as weepy per wife.     Pain Inventory Average Pain 2 Pain Right Now 1 My pain is intermittent and aching  In the last 24 hours, has pain interfered with the following? General activity 0 Relation with others 0 Enjoyment of life 0 What TIME of day is your pain at its worst? evening Sleep (in general) Good  Pain is worse with: sitting Pain improves with: therapy/exercise Relief from Meds: 9  Family History  Problem Relation Age of Onset   Hypertension Mother    Stroke Mother    Gout Father    Heart attack Father 63   Heart disease Sister    Lung cancer Sister    Drug abuse Sister    Breast cancer Paternal Aunt    Lung cancer Maternal Grandmother    Colon cancer Neg Hx    Esophageal cancer Neg Hx    Rectal cancer Neg Hx    Stomach  cancer Neg Hx    Social History   Socioeconomic History   Marital status: Married    Spouse name: [name]    Number of children: 0   Years of education: Not on file   Highest education level: Bachelor's degree (e.g., BA, AB, BS)  Occupational History   Occupation: Warden/ranger: [employer]  Tobacco Use   Smoking status: Never    Passive exposure: Never   Smokeless tobacco: Never  Vaping Use   Vaping status: Never Used  Substance and Sexual Activity   Alcohol use: Not Currently   Drug use: No   Sexual activity: Not Currently  Other Topics Concern   Not on file  Social History Narrative   Lives at home with spouse.    Social Drivers of Corporate investment banker Strain: Low Risk  (03/31/2023)   Overall Financial Resource Strain (CARDIA)    Difficulty of Paying Living Expenses: Not hard at  all  Food Insecurity: No Food Insecurity (03/31/2023)   Hunger Vital Sign    Worried About Running Out of Food in the Last Year: Never true    Ran Out of Food in the Last Year: Never true  Transportation Needs: No Transportation Needs (03/31/2023)   PRAPARE - Administrator, Civil Service (Medical): No    Lack of Transportation (Non-Medical): No  Physical Activity: Insufficiently Active (03/31/2023)   Exercise Vital Sign    Days of Exercise per Week: 3 days    Minutes of Exercise per Session: 30 min  Stress: No Stress Concern Present (03/31/2023)   [employer] of Occupational Health - Occupational Stress Questionnaire    Feeling of Stress : Only a little  Social Connections: Moderately Integrated (03/31/2023)   Social Connection and Isolation Panel [NHANES]    Frequency of Communication with Friends and Family: Three times a week    Frequency of Social Gatherings with Friends and Family: Three times a week    Attends Religious Services: Never    Active Member of Clubs or Organizations: Yes    Attends Banker Meetings: 1 to 4 times per year    Marital Status: Married   Past Surgical History:  Procedure Laterality Date   CARDIAC CATHETERIZATION  03/2000   HERNIA REPAIR  1961   umbilical   IR CATHETER TUBE CHANGE  12/12/2022   IR US  GUIDE BX ASP/DRAIN  11/11/2022   LEFT HEART CATH AND CORONARY ANGIOGRAPHY Left 03/20/2020   Procedure: LEFT HEART CATH AND CORONARY ANGIOGRAPHY;  Surgeon: Sammy Crisp, MD;  Location: [facility];  Service: Cardiovascular;  Laterality: Left;   POSTERIOR CERVICAL FUSION/FORAMINOTOMY N/A 10/06/2021   Procedure: POSTERIOR CERVICAL FUSION/ FORAMINOTOMY LEVEL 3;  Surgeon: Jodeen Munch, MD;  Location: [facility];  Service: Neurosurgery;  Laterality: N/A;  will need monitoring   RADIOLOGY WITH ANESTHESIA Left 04/18/2021   Procedure: MRI SHOULDER WITHOUT CONTRAST WITH ANESTHESIA;  Surgeon: Radiologist, Medication, MD;   Location: [facility];  Service: Radiology;  Laterality: Left;   RADIOLOGY WITH ANESTHESIA N/A 10/03/2021   Procedure: MRI CERVICAL SPINE WITH ANESTHESIA;  Surgeon: Radiologist, Medication, MD;  Location: [facility];  Service: Radiology;  Laterality: N/A;   RADIOLOGY WITH ANESTHESIA N/A 01/30/2022   Procedure: MRI CERVICAL SPINE WITH AND WITOHOUT CONTRAST WITH ANESTHESIA;  Surgeon: Radiologist, Medication, MD;  Location: [facility];  Service: Radiology;  Laterality: N/A;   RADIOLOGY WITH ANESTHESIA N/A 05/22/2022   Procedure: MRI LUMBER SPINE AND CERVIC SPINE WITH  AND WITHOUT CONTRAST;  Surgeon: Radiologist, Medication, MD;  Location: [facility];  Service: Radiology;  Laterality: N/A;   RADIOLOGY WITH ANESTHESIA N/A 12/25/2022   Procedure: MRI WITH ANESTHESIA OF CERVICAL SPINE WITH AND WITHOUT CONTRAST,CARDIAC MORPHOLOGY;  Surgeon: Radiologist, Medication, MD;  Location: [facility];  Service: Radiology;  Laterality: N/A;   REVERSE SHOULDER ARTHROPLASTY Left    ROTATOR CUFF REPAIR  11/2010   Dr Annabell Key   SUPRAPUBIC CATHETER INSERTION  11/11/2022   TONSILLECTOMY     TOTAL HIP ARTHROPLASTY Right 02/24/2009   TOTAL HIP ARTHROPLASTY Left 06/2016   Dr Hobart Lulas   Past Surgical History:  Procedure Laterality Date   CARDIAC CATHETERIZATION  03/2000   HERNIA REPAIR  1961   umbilical   IR CATHETER TUBE CHANGE  12/12/2022   IR US  GUIDE BX ASP/DRAIN  11/11/2022   LEFT HEART CATH AND CORONARY ANGIOGRAPHY Left 03/20/2020   Procedure: LEFT HEART CATH AND CORONARY ANGIOGRAPHY;  Surgeon: Sammy Crisp, MD;  Location: [facility];  Service: Cardiovascular;  Laterality: Left;   POSTERIOR CERVICAL FUSION/FORAMINOTOMY N/A 10/06/2021   Procedure: POSTERIOR CERVICAL FUSION/ FORAMINOTOMY LEVEL 3;  Surgeon: Jodeen Munch, MD;  Location: [facility];  Service: Neurosurgery;  Laterality: N/A;  will need monitoring   RADIOLOGY WITH ANESTHESIA Left 04/18/2021   Procedure: MRI SHOULDER WITHOUT CONTRAST WITH ANESTHESIA;  Surgeon:  Radiologist, Medication, MD;  [facility];  Service: Radiology;  Laterality: Left;   RADIOLOGY WITH ANESTHESIA N/A 10/03/2021   Procedure: MRI CERVICAL SPINE WITH ANESTHESIA;  Surgeon: Radiologist, Medication, MD;  Location: [facility];  Service: Radiology;  Laterality: N/A;   RADIOLOGY WITH ANESTHESIA N/A 01/30/2022   Procedure: MRI CERVICAL SPINE WITH AND WITOHOUT CONTRAST WITH ANESTHESIA;  Surgeon: Radiologist, Medication, MD;  [facility]: [facility];  Service: Radiology;  Laterality: N/A;   RADIOLOGY WITH ANESTHESIA N/A 05/22/2022   Procedure: MRI LUMBER SPINE AND CERVIC SPINE WITH AND WITHOUT CONTRAST;  Surgeon: Radiologist, Medication, MD;  [facility]: [facility];  Service: Radiology;  Laterality: N/A;   RADIOLOGY WITH ANESTHESIA N/A 12/25/2022   Procedure: MRI WITH ANESTHESIA OF CERVICAL SPINE WITH AND WITHOUT CONTRAST,CARDIAC MORPHOLOGY;  Surgeon: Radiologist, Medication, MD;  Location: [facility];  Service: Radiology;  Laterality: N/A;   REVERSE SHOULDER ARTHROPLASTY Left    ROTATOR CUFF REPAIR  11/2010   Dr Annabell Key   SUPRAPUBIC CATHETER INSERTION  11/11/2022   TONSILLECTOMY     TOTAL HIP ARTHROPLASTY Right 02/24/2009   TOTAL HIP ARTHROPLASTY Left 06/2016   Dr Hobart Lulas   Past Medical History:  Diagnosis Date   Allergy    Arthritis    osteoarthritis   Cancer [facility])    prostate   CHF (congestive heart failure) (HCC)    GERD (gastroesophageal reflux disease)    History of kidney stones    passed   Hypertension    Hypothyroidism    Palpitations 2022   Paralysis (HCC)    paraplegia 09/2021-surgery or stroke   Pneumonia    12/22/28- > 25 years ago   Stroke [facility])    Mini stroke - Aug 2023   Thyroid  disease    Hyperthyroidism s/p radioactive iodine ablation   BP 98/61   Pulse (!) 58   Ht 5\' 9"  (1.753 m)   Wt 155 lb (70.3 kg)   SpO2 92%   BMI 22.89 kg/m   Opioid Risk Score:   Fall Risk Score:  `1  Depression screen [facility] 2/9     01/16/2023    1:09 PM 09/19/2022  12:55 PM 12/02/2021     1:20 PM 10/15/2020    3:41 PM 10/10/2019    3:10 PM 10/05/2018    7:54 AM 08/28/2017    8:28 AM  Depression screen PHQ 2/9  Decreased Interest 0 0 0 0 0 0 0  Down, Depressed, Hopeless 0 0 1 0 0 0 0  PHQ - 2 Score 0 0 1 0 0 0 0  Altered sleeping   0      Tired, decreased energy   1      Change in appetite   0      Feeling bad or failure about yourself    0      Trouble concentrating   0      Moving slowly or fidgety/restless   0      Suicidal thoughts   0      PHQ-9 Score   2         Review of Systems  Musculoskeletal:        Right buttocks pain  All other systems reviewed and are negative.     Objective:   Physical Exam  Pt in power w/c; accompanied by wife; NAD Not anxious today.   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  Here for f/u on SCI.    Con't Baclofen  10 mg nightly- can use up to 20 mg/day of Baclofen -  but keeps the 10 mg nightly for scheduled dose and 10 mg daily as needed for spasms esp if gets infection.   2.  We discussed that standing well- I'm proud of you and you are making gains!!!   3. Has MRI to f/u on prostate CA 11/25- under anesthesia.   4.   Audio books? To listen even more than reading- Hilda Lovings. Is Scifi -fantasy.   5. Mood is up and down- but we discussed reading to help  spasms.    6. Goals should start to transfer -  since has to rent transport to get here. Walking is down the road! We discussed how to improve life with improved transfers.    7. Con't H/H PT- and nurse as needed.    8. Con't using PRAFO's- and compression stockings for low BP.    9. F/U- 3 months double appt- SCI   I spent a total of  35  minutes on total care today- >50% coordination of care- due to  d/w pt about mood- and goals; and therapy/pressure ulcer/yeast that's almost healed- and how ot get back into reading

## 2023-07-24 NOTE — Patient Instructions (Addendum)
 11/24 MSK UE"s- deltoids 4-/5; biceps 5/5; WE 5/5 on R 4/5 on L; triceps 5/5 B/L; grip 4+/5 B/L and FA 4+/5 B/L LE's-  RLE- HF 2-/5; KE 3+/5; DF 3+/5; PF 3+/5 LLE- HF 3-/5; KE 4/5; DF 4/5 and PF 4/5    Current MSK: UE- biceps 5/5; WE 5-/5 on R and 4+/5 on L; Triceps 5-/5; Grip 4+/5; FA- 4-/5 LE's-  RLE- HF - 2-/5; KE 4/5; DF 3+/5; and PF 2+/5 LLE-  HF 3+/5; KE 5-/5; DF 4+/5 and PF 5-/5   Neuro:  very mild increased tone on exam (was MAS of 2 at last visit)  2-3 beats of clonus RLE; none on LLE No hoffman's B/L   Assessment & Plan:   Pt is a 68 yr old male with nontraumatic Quadriplegia-  09/2021- 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  Here for f/u on SCI.    Con't Baclofen  10 mg nightly- can use up to 20 mg/day of Baclofen -  but keeps the 10 mg nightly for scheduled dose and 10 mg daily as needed for spasms esp if gets infection.   2.  We discussed that standing well- I'm proud of you and you are making gains!!!   3. Has MRI to f/u on prostate CA 11/25- under anesthesia.   4.   Audio books? To listen even more than reading- Hilda Lovings. Is Scifi -fantasy.   5. Mood is up and down- but we discussed reading to help spasms.    6. Goals should start to transfer -  since has to rent transport to get here. Walking is down the road! We discussed how to improve life with improved transfers.    7. Con't H/H PT- and nurse as needed.    8. Con't using PRAFO's- and compression stockings for low BP.    9. F/U- 3 months double appt- SCI

## 2023-08-04 ENCOUNTER — Other Ambulatory Visit (HOSPITAL_COMMUNITY): Payer: Self-pay

## 2023-08-04 ENCOUNTER — Telehealth: Payer: Self-pay | Admitting: Pharmacy Technician

## 2023-08-04 MED ORDER — ENTRESTO 24-26 MG PO TABS: 1.0000 | ORAL_TABLET | Freq: Two times a day (BID) | ORAL | 3 refills | Status: DC

## 2023-08-04 NOTE — Telephone Encounter (Signed)
    I called [employer] and they said he is still enrolled in their assistance program until 02/24/24 but they need a new prescription sent to their pharmacy. Sent staff back message in other encounter

## 2023-08-07 ENCOUNTER — Inpatient Hospital Stay

## 2023-08-07 ENCOUNTER — Encounter: Payer: Self-pay | Admitting: Oncology

## 2023-08-07 ENCOUNTER — Inpatient Hospital Stay (HOSPITAL_BASED_OUTPATIENT_CLINIC_OR_DEPARTMENT_OTHER): Admitting: Oncology

## 2023-08-07 ENCOUNTER — Inpatient Hospital Stay: Attending: Oncology

## 2023-08-07 ENCOUNTER — Ambulatory Visit (INDEPENDENT_AMBULATORY_CARE_PROVIDER_SITE_OTHER): Admitting: Physician Assistant

## 2023-08-07 ENCOUNTER — Telehealth: Payer: Self-pay | Admitting: Internal Medicine

## 2023-08-07 VITALS — BP 109/68 | HR 60 | Ht 69.0 in | Wt 155.0 lb

## 2023-08-07 VITALS — BP 120/72 | HR 60 | Temp 97.9°F | Resp 16 | Ht 69.0 in

## 2023-08-07 DIAGNOSIS — I700 Atherosclerosis of aorta: Secondary | ICD-10-CM | POA: Diagnosis not present

## 2023-08-07 DIAGNOSIS — M47816 Spondylosis without myelopathy or radiculopathy, lumbar region: Secondary | ICD-10-CM | POA: Diagnosis not present

## 2023-08-07 DIAGNOSIS — Z8673 Personal history of transient ischemic attack (TIA), and cerebral infarction without residual deficits: Secondary | ICD-10-CM | POA: Insufficient documentation

## 2023-08-07 DIAGNOSIS — I110 Hypertensive heart disease with heart failure: Secondary | ICD-10-CM | POA: Insufficient documentation

## 2023-08-07 DIAGNOSIS — Z803 Family history of malignant neoplasm of breast: Secondary | ICD-10-CM | POA: Insufficient documentation

## 2023-08-07 DIAGNOSIS — D539 Nutritional anemia, unspecified: Secondary | ICD-10-CM | POA: Insufficient documentation

## 2023-08-07 DIAGNOSIS — Z9089 Acquired absence of other organs: Secondary | ICD-10-CM | POA: Insufficient documentation

## 2023-08-07 DIAGNOSIS — N3000 Acute cystitis without hematuria: Secondary | ICD-10-CM

## 2023-08-07 DIAGNOSIS — K5730 Diverticulosis of large intestine without perforation or abscess without bleeding: Secondary | ICD-10-CM | POA: Insufficient documentation

## 2023-08-07 DIAGNOSIS — M4802 Spinal stenosis, cervical region: Secondary | ICD-10-CM | POA: Diagnosis not present

## 2023-08-07 DIAGNOSIS — R5383 Other fatigue: Secondary | ICD-10-CM | POA: Insufficient documentation

## 2023-08-07 DIAGNOSIS — C61 Malignant neoplasm of prostate: Secondary | ICD-10-CM

## 2023-08-07 DIAGNOSIS — Z87442 Personal history of urinary calculi: Secondary | ICD-10-CM | POA: Insufficient documentation

## 2023-08-07 DIAGNOSIS — C7951 Secondary malignant neoplasm of bone: Secondary | ICD-10-CM | POA: Diagnosis not present

## 2023-08-07 DIAGNOSIS — Z86718 Personal history of other venous thrombosis and embolism: Secondary | ICD-10-CM

## 2023-08-07 DIAGNOSIS — Z823 Family history of stroke: Secondary | ICD-10-CM | POA: Diagnosis not present

## 2023-08-07 DIAGNOSIS — Z435 Encounter for attention to cystostomy: Secondary | ICD-10-CM | POA: Diagnosis not present

## 2023-08-07 DIAGNOSIS — Z8349 Family history of other endocrine, nutritional and metabolic diseases: Secondary | ICD-10-CM | POA: Insufficient documentation

## 2023-08-07 DIAGNOSIS — I509 Heart failure, unspecified: Secondary | ICD-10-CM | POA: Diagnosis not present

## 2023-08-07 DIAGNOSIS — G8220 Paraplegia, unspecified: Secondary | ICD-10-CM | POA: Diagnosis not present

## 2023-08-07 DIAGNOSIS — Z79890 Hormone replacement therapy (postmenopausal): Secondary | ICD-10-CM | POA: Insufficient documentation

## 2023-08-07 DIAGNOSIS — M48061 Spinal stenosis, lumbar region without neurogenic claudication: Secondary | ICD-10-CM | POA: Insufficient documentation

## 2023-08-07 DIAGNOSIS — Z814 Family history of other substance abuse and dependence: Secondary | ICD-10-CM | POA: Insufficient documentation

## 2023-08-07 DIAGNOSIS — Z79899 Other long term (current) drug therapy: Secondary | ICD-10-CM | POA: Diagnosis not present

## 2023-08-07 DIAGNOSIS — C7940 Secondary malignant neoplasm of unspecified part of nervous system: Secondary | ICD-10-CM | POA: Diagnosis not present

## 2023-08-07 DIAGNOSIS — E039 Hypothyroidism, unspecified: Secondary | ICD-10-CM | POA: Insufficient documentation

## 2023-08-07 DIAGNOSIS — Z8249 Family history of ischemic heart disease and other diseases of the circulatory system: Secondary | ICD-10-CM | POA: Insufficient documentation

## 2023-08-07 DIAGNOSIS — Z801 Family history of malignant neoplasm of trachea, bronchus and lung: Secondary | ICD-10-CM | POA: Insufficient documentation

## 2023-08-07 DIAGNOSIS — Z7982 Long term (current) use of aspirin: Secondary | ICD-10-CM | POA: Diagnosis not present

## 2023-08-07 LAB — CBC WITH DIFFERENTIAL (CANCER CENTER ONLY)
Abs Immature Granulocytes: 0.02 10*3/uL (ref 0.00–0.07)
Basophils Absolute: 0.1 10*3/uL (ref 0.0–0.1)
Basophils Relative: 1 %
Eosinophils Absolute: 0.2 10*3/uL (ref 0.0–0.5)
Eosinophils Relative: 3 %
HCT: 35.5 % — ABNORMAL LOW (ref 39.0–52.0)
Hemoglobin: 11.9 g/dL — ABNORMAL LOW (ref 13.0–17.0)
Immature Granulocytes: 0 %
Lymphocytes Relative: 11 %
Lymphs Abs: 0.8 10*3/uL (ref 0.7–4.0)
MCH: 33.9 pg (ref 26.0–34.0)
MCHC: 33.5 g/dL (ref 30.0–36.0)
MCV: 101.1 fL — ABNORMAL HIGH (ref 80.0–100.0)
Monocytes Absolute: 0.5 10*3/uL (ref 0.1–1.0)
Monocytes Relative: 7 %
Neutro Abs: 5.3 10*3/uL (ref 1.7–7.7)
Neutrophils Relative %: 78 %
Platelet Count: 197 10*3/uL (ref 150–400)
RBC: 3.51 MIL/uL — ABNORMAL LOW (ref 4.22–5.81)
RDW: 13.2 % (ref 11.5–15.5)
WBC Count: 6.8 10*3/uL (ref 4.0–10.5)
nRBC: 0 % (ref 0.0–0.2)

## 2023-08-07 LAB — CMP (CANCER CENTER ONLY)
ALT: 42 U/L (ref 0–44)
AST: 31 U/L (ref 15–41)
Albumin: 3.4 g/dL — ABNORMAL LOW (ref 3.5–5.0)
Alkaline Phosphatase: 102 U/L (ref 38–126)
Anion gap: 7 (ref 5–15)
BUN: 8 mg/dL (ref 8–23)
CO2: 27 mmol/L (ref 22–32)
Calcium: 8.3 mg/dL — ABNORMAL LOW (ref 8.9–10.3)
Chloride: 98 mmol/L (ref 98–111)
Creatinine: 0.44 mg/dL — ABNORMAL LOW (ref 0.61–1.24)
GFR, Estimated: >60 mL/min (ref >60)
Glucose, Bld: 93 mg/dL (ref 70–99)
Potassium: 3.9 mmol/L (ref 3.5–5.1)
Sodium: 132 mmol/L — ABNORMAL LOW (ref 135–145)
Total Bilirubin: 1.2 mg/dL (ref 0.0–1.2)
Total Protein: 6.2 g/dL — ABNORMAL LOW (ref 6.5–8.1)

## 2023-08-07 LAB — PSA: Prostatic Specific Antigen: 5.35 ng/mL — ABNORMAL HIGH (ref 0.00–4.00)

## 2023-08-07 MED ORDER — AMOXICILLIN 875 MG PO TABS: 875.0000 mg | ORAL_TABLET | Freq: Two times a day (BID) | ORAL | 0 refills | Status: AC

## 2023-08-07 MED ORDER — ENTRESTO 24-26 MG PO TABS: 1.0000 | ORAL_TABLET | Freq: Two times a day (BID) | ORAL | 3 refills | Status: DC

## 2023-08-07 MED ORDER — LEUPROLIDE ACETATE (6 MONTH) 45 MG ~~LOC~~ KIT
45.0000 mg | PACK | Freq: Once | SUBCUTANEOUS | Status: AC
  Administered 2023-08-07: 45 mg via SUBCUTANEOUS
  Filled 2023-08-07: qty 45

## 2023-08-07 NOTE — Telephone Encounter (Signed)
 New RX sent to pharmacy for 90 days with 3 refill

## 2023-08-07 NOTE — Assessment & Plan Note (Addendum)
 Metastatic castration sensitive prostate cancer, with CNS involvement. Currently on chemoprevention therapy with Eligard .  Status post palliative radiation to spine. PMSA PET scan showed partial response. Previously on Xtandi  160mg  daily, switched to Darolutamide  600mg  BID due to cardiology issues/drug interactions.  Labs are reviewed and discussed with patient.  Continue Darolutamide  600mg  BID  PSA gradually increases, 5.35 Recommend repeat PSMA

## 2023-08-07 NOTE — Progress Notes (Addendum)
 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 Currently on chemoprevention therapy with Eligard .  Status post palliative radiation to spine. PMSA PET scan showed partial response. Previously on Xtandi  160mg  daily, switched to Darolutamide  600mg  BID due to cardiology issues/drug interactions.  Labs are reviewed and discussed with patient.  Continue Darolutamide  600mg  BID  PSA gradually increases, 5.35 Recommend repeat PSMA   Androgen deprivation therapy On Eligard  45mg  Q6 months. Today, and next dose due Dec 2025  History of deep vein thrombosis (DVT) of lower extremity Provoked, continue Aspirin  162 mg daily for prophylaxis.  Macrocytic anemia Slightly decreased hemoglobin, macrocytosis.  Recommend empiric B12 supplementation 1000mcg daily Check B12 and Folate   Orders Placed This Encounter  Procedures   CMP (Cancer Center only)    Standing Status:   Future    Expected Date:   11/07/2023    Expiration Date:   02/05/2024   CBC with Differential (Cancer Center Only)    Standing Status:   Future    Expected Date:   11/07/2023    Expiration Date:   02/05/2024   PSA    Standing Status:   Future    Expected Date:   11/07/2023    Expiration Date:   02/05/2024   Vitamin B12    Standing Status:   Future    Expected Date:   11/07/2023    Expiration Date:   02/05/2024   Folate    Standing Status:   Future    Expected Date:   11/07/2023    Expiration Date:   02/05/2024   Follow up 3 months.   All  questions were answered. The patient knows to call the clinic with any problems, questions or concerns.  Jason Forbes, MD, PhD [name] Hematology Oncology 08/07/2023        HISTORY OF PRESENTING ILLNESS:  [name] 68 y.o. male presents to establish care for metastatic prostate cancer I have reviewed his chart and materials related to his cancer extensively and collaborated history with the patient. 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  Associated epidural disease greater at the C6 where there is marked canal stenosis without cord compression.  Left greater than right foraminal effacement due to the extraosseous disease.   10/03/2021 Imaging   MRI left shoulder showed High-riding humeral head with massive full-thickness tear of the entire supraspinatus tendon and the anterior 50% of the infraspinatus tendon. Additional partial-thickness articular sided tearing of the posterior infraspinatus. Moderate to high-grade supraspinatus and infraspinatus muscle atrophy suggests these tears  are chronic. Partial-thickness tearing of the superior greater than inferior  aspects of the subscapularis tendon footprint. Mild subscapularis muscle atrophy.  Moderate degenerative changes of the acromioclavicular joint. Moderate glenohumeral cartilage degenerative changes.   10/05/2021 Imaging   CT chest abdomen pelvis with contrast showed  Moderate severity retroperitoneal and pelvic lymphadenopathy,consistent with metastatic  disease. Small lytic areas at the levels of L4 and S1, with diffusely sclerotic changes involving the second and fourth right ribs, sacrum and left iliac bone. 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  Sigmoid diverticulosis. Aortic atherosclerosis   10/05/2021 Imaging   CT cervical spine without contrast Extensive heterogeneous sclerotic lesions throughout the cervical spine compatible with metastatic disease to the bone.  Mixed sclerotic and lytic changes in the posterior elements at C5 and C6.Extraosseous tumor at C5 and C6 is better appreciated on the MRI scan.   10/05/2021 Tumor Marker   PSA >1500   10/06/2021 Cancer Staging   Staging form: Prostate, AJCC 8th Edition - Clinical stage from 10/06/2021: Stage IVB (cN1, pM1c, PSA: 1500) - Signed by Jason Forbes, MD on 12/04/2021 Stage prefix: Initial diagnosis Prostate specific antigen (PSA) range: 20 or greater   10/08/2021 Imaging   MRI thoracic spine and the lumbar spine with and without contrast 1. 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 2. 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 3. Additional epidural tumor along the posterior endplates at T9,T11, and T12 without high-grade spinal canal stenosis or cord compression. 4. Extensive epidural tumor in the lumbar spine is greatest in thickness at L1 without high-grade spinal canal stenosis at this level; however, superimposed on pre-existing degenerative changes lower in the lumbar spine results in severe spinal canal stenosis with cauda equina nerve root compression at L3-L4 and L4-L5. Multilevel neural foraminal stenosis is detailed above. 5. Postsurgical changes reflecting C6 laminectomy unchanged spinal canal stenosis compared to the postoperative study from 1 day prior.No definite cord signal abnormality. 6. Extramedullary fluid  collection along the dorsal aspect of the cord beginning at T9-T10 extending into the lumbar spine is likely subdural in location but is of uncertain etiology; evolving blood products not excluded. The collection anteriorly displaces the cord and cauda equina nerve roots without frank cord compression or signal abnormality. There is no peripheral enhancement to suggest abscess. 7. Prevertebral edema in the cervical spine which may be postsurgical in nature. 8. New bilateral lower lobe consolidations may reflect atelectasis or developing pneumonia/aspiration.   10/08/2021 Imaging   MRI brain with and without contrast showed 1. 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 There is intracranial extension of tumor along the right frontal convexity measuring up to 5 mm in thickness without mass effect on the underlying brain parenchyma or midline shift. Extracranial extension of tumor in this location measures up to approximately 3 mm. 2. Additional extracranial extension of tumor into the scalp along the vertex measuring up to 7 mm in thickness. 3. Small focus of diffusion restriction in the right parietal cortex most in keeping with a small acute infarct. 4. No evidence of parenchymal metastatic disease.   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 10/06/2021, patient underwent C6 cervical laminectomy and resection of tumor.  Pathology came back positive for metastatic prostate cancer. 8/14 developed significant bilateral upper and lower extremity weakness with hypotension requiring transfer to the ICU, pressors, steroid, respiratory failure on mechanical ventilation. Later patient was stabilized and extubated.  8/16 Firmagon  loading dose 240mg   9/15 Eligard  22.5mg  given inpatient during his rehab admission.     10/2021 -  Radiation Therapy    Palliative radiation to spine.    12/04/2021 Tumor Marker   PSA 67.09   01/13/2022 Tumor Marker   PSA 43.95   01/23/2022 -  Chemotherapy   Started on Xtandi  160mg  daily.     INTERVAL HISTORY OBI [name] is a 68 y.o. male who has above history reviewed by me today presents for follow up visit for metastatic prostate cancer. Patient was accompanied by wife.  Patient has chronic bilateral lower extremity extremity weakness, generalized left upper extremity weakness.  Aaron Aas  He follows up with neurology.He continues physical therapy and has made some progress.  + fatigued, brain fog, fatigue, unchanged.    MEDICAL HISTORY:  Past Medical History:  Diagnosis Date   Allergy    Arthritis    osteoarthritis   Cancer (HCC)    prostate   CHF (congestive heart failure) (HCC)    GERD (gastroesophageal reflux disease)    History of kidney stones    passed   Hypertension    Hypothyroidism    Palpitations 2022   Paralysis (HCC)    paraplegia 09/2021-surgery or stroke   Pneumonia    12/22/28- > 25 years ago   Stroke [facility])    Mini stroke - Aug 2023   Thyroid  disease    Hyperthyroidism s/p radioactive iodine ablation    SURGICAL HISTORY: Past Surgical History:  Procedure Laterality Date   CARDIAC CATHETERIZATION  03/2000   HERNIA REPAIR  1961   umbilical   IR CATHETER TUBE CHANGE  12/12/2022   IR US  GUIDE BX ASP/DRAIN  11/11/2022   LEFT HEART CATH AND CORONARY ANGIOGRAPHY Left 03/20/2020   Procedure: LEFT HEART CATH AND CORONARY ANGIOGRAPHY;  Surgeon: Sammy Crisp, MD;  Location: [facility] INVASIVE CV LAB;  Service: Cardiovascular;  Laterality: Left;   POSTERIOR CERVICAL FUSION/FORAMINOTOMY N/A 10/06/2021   Procedure: POSTERIOR CERVICAL FUSION/ FORAMINOTOMY LEVEL 3;  Surgeon: Jodeen Munch, MD;  Location: [facility] ORS;  Service: Neurosurgery;  Laterality: N/A;  will need monitoring   RADIOLOGY WITH ANESTHESIA Left 04/18/2021   Procedure: MRI SHOULDER WITHOUT CONTRAST WITH  ANESTHESIA;  Surgeon: Radiologist, Medication, MD;  Location: [facility] OR;  Service: Radiology;  Laterality: Left;   RADIOLOGY WITH ANESTHESIA N/A 10/03/2021   Procedure: MRI CERVICAL SPINE WITH ANESTHESIA;  Surgeon: Radiologist, Medication, MD;  Location: [facility] OR;  Service: Radiology;  Laterality: N/A;   RADIOLOGY WITH ANESTHESIA N/A 01/30/2022   Procedure: MRI CERVICAL SPINE WITH AND WITOHOUT CONTRAST WITH ANESTHESIA;  Surgeon: Radiologist, Medication, MD;  Location: [facility] OR;  Service: Radiology;  Laterality: N/A;   RADIOLOGY WITH ANESTHESIA N/A 05/22/2022   Procedure: MRI LUMBER SPINE AND CERVIC SPINE WITH AND WITHOUT CONTRAST;  Surgeon: Radiologist, Medication, MD;  Location: [facility] OR;  Service: Radiology;  Laterality: N/A;   RADIOLOGY WITH ANESTHESIA N/A 12/25/2022   Procedure: MRI WITH ANESTHESIA OF CERVICAL SPINE WITH AND WITHOUT CONTRAST,CARDIAC MORPHOLOGY;  Surgeon: Radiologist, Medication, MD;  Location: [facility] OR;  Service: Radiology;  Laterality: N/A;   REVERSE SHOULDER ARTHROPLASTY Left    ROTATOR CUFF REPAIR  11/2010   Dr Annabell Key   SUPRAPUBIC CATHETER INSERTION  11/11/2022   TONSILLECTOMY     TOTAL HIP ARTHROPLASTY Right 02/24/2009   TOTAL HIP ARTHROPLASTY Left 06/2016   Dr Hobart Lulas    SOCIAL HISTORY: Social History   Socioeconomic History   Marital status: Married    Spouse  name: Sammi Crick    Number of children: 0   Years of education: Not on file   Highest education level: Bachelor's degree (e.g., BA, AB, BS)  Occupational History   Occupation: Warden/ranger: [employer]  Tobacco Use   Smoking status: Never    Passive exposure: Never   Smokeless tobacco: Never  Vaping Use   Vaping status: Never Used  Substance and Sexual Activity   Alcohol use: Not Currently   Drug use: No   Sexual activity: Not Currently  Other Topics Concern   Not on file  Social History Narrative   Lives at home with spouse.    Social Drivers of Corporate investment banker Strain: Low Risk   (03/31/2023)   Overall Financial Resource Strain (CARDIA)    Difficulty of Paying Living Expenses: Not hard at all  Food Insecurity: No Food Insecurity (03/31/2023)   Hunger Vital Sign    Worried About Running Out of Food in the Last Year: Never true    Ran Out of Food in the Last Year: Never true  Transportation Needs: No Transportation Needs (03/31/2023)   PRAPARE - Administrator, Civil Service (Medical): No    Lack of Transportation (Non-Medical): No  Physical Activity: Insufficiently Active (03/31/2023)   Exercise Vital Sign    Days of Exercise per Week: 3 days    Minutes of Exercise per Session: 30 min  Stress: No Stress Concern Present (03/31/2023)   [employer] of Occupational Health - Occupational Stress Questionnaire    Feeling of Stress : Only a little  Social Connections: Moderately Integrated (03/31/2023)   Social Connection and Isolation Panel    Frequency of Communication with Friends and Family: Three times a week    Frequency of Social Gatherings with Friends and Family: Three times a week    Attends Religious Services: Never    Active Member of Clubs or Organizations: Yes    Attends Banker Meetings: 1 to 4 times per year    Marital Status: Married  Catering manager Violence: Not on file    FAMILY HISTORY: Family History  Problem Relation Age of Onset   Hypertension Mother    Stroke Mother    Gout Father    Heart attack Father 69   Heart disease Sister    Lung cancer Sister    Drug abuse Sister    Breast cancer Paternal Aunt    Lung cancer Maternal Grandmother    Colon cancer Neg Hx    Esophageal cancer Neg Hx    Rectal cancer Neg Hx    Stomach cancer Neg Hx     ALLERGIES:  has no known allergies.  MEDICATIONS:  Current Outpatient Medications  Medication Sig Dispense Refill   acetaminophen  (TYLENOL ) 500 MG tablet Take 1,000 mg by mouth 2 (two) times daily.     ascorbic acid  (VITAMIN C ) 500 MG tablet Take 1 tablet (500 mg total)  by mouth daily. 30 tablet 0   aspirin  EC 81 MG tablet Take 1 tablet (81 mg total) by mouth daily. Swallow whole. 30 tablet 12   atorvastatin  (LIPITOR) 40 MG tablet Take 1 tablet (40 mg total) by mouth daily. 90 tablet 3   baclofen  (LIORESAL ) 10 MG tablet Take 0.5-1 tablets (5-10 mg total) by mouth 2 (two) times daily as needed for muscle spasms. 60 each 5   cetirizine (ZYRTEC) 10 MG tablet Take 10 mg by mouth at bedtime.     clonazePAM  (  KLONOPIN ) 0.5 MG tablet Take 1 tablet by mouth twice daily 60 tablet 0   CRANBERRY PO Take 1 tablet by mouth 2 (two) times daily.     darolutamide  (NUBEQA ) 300 MG tablet Take 2 tablets (600 mg total) by mouth 2 (two) times daily with a meal. 120 tablet 3   famotidine  (PEPCID ) 20 MG tablet Take 1 tablet (20 mg total) by mouth daily. 30 tablet 0   FLUoxetine  (PROZAC ) 20 MG capsule Take 1 capsule (20 mg total) by mouth at bedtime. 90 capsule 3   fluticasone  (FLONASE ) 50 MCG/ACT nasal spray Place 1 spray into both nostrils daily. 48 g 3   guaiFENesin  (MUCINEX ) 600 MG 12 hr tablet Take 600 mg by mouth 2 (two) times daily.     levothyroxine  (SYNTHROID ) 88 MCG tablet Take 1 tablet (88 mcg total) by mouth daily. 90 tablet 3   metoprolol  succinate (TOPROL -XL) 25 MG 24 hr tablet Take 1 tablet (25 mg total) by mouth at bedtime. Take with or immediately following a meal. 90 tablet 3   Multiple Vitamin (MULTIVITAMIN) tablet Take 1 tablet by mouth daily.     potassium chloride  SA (KLOR-CON  M) 20 MEQ tablet Take 1 tablet (20 mEq total) by mouth at bedtime. 90 tablet 3   sennosides-docusate sodium  (SENOKOT-S) 8.6-50 MG tablet Take 2 tablets by mouth daily. 60 tablet 11   amoxicillin  (AMOXIL ) 500 MG capsule Take 2,000 mg by mouth See admin instructions. Take 2000 mg 1 hour prior to dental work     amoxicillin  (AMOXIL ) 875 MG tablet Take 1 tablet (875 mg total) by mouth 2 (two) times daily for 7 days. 14 tablet 0   sacubitril-valsartan (ENTRESTO ) 24-26 MG Take 1 tablet by mouth 2  (two) times daily. 180 tablet 3   silver  sulfADIAZINE  (SILVADENE ) 1 % cream Apply 1 Application topically daily. 50 g 1   No current facility-administered medications for this visit.    Review of Systems  Constitutional:  Positive for fatigue. Negative for appetite change and chills.  HENT:   Negative for hearing loss.   Eyes:  Negative for icterus.  Respiratory:  Negative for cough and shortness of breath.   Cardiovascular:  Negative for chest pain.  Gastrointestinal:  Negative for abdominal pain and blood in stool.  Genitourinary:         + Foley catheter  Musculoskeletal:  Negative for back pain.  Skin:  Negative for rash.  Neurological:  Positive for extremity weakness.     PHYSICAL EXAMINATION: ECOG PERFORMANCE STATUS: 3 - Symptomatic, >50% confined to bed  Vitals:   08/07/23 1050  BP: 120/72  Pulse: 60  Resp: 16  Temp: 97.9 F (36.6 C)  SpO2: 100%   There were no vitals filed for this visit.      Physical Exam Constitutional:      General: He is not in acute distress.    Appearance: He is not diaphoretic.     Comments: Patient sits in wheelchair.  HENT:     Head: Normocephalic and atraumatic.     Nose: Nose normal.     Mouth/Throat:     Pharynx: No oropharyngeal exudate.   Eyes:     General: No scleral icterus.   Cardiovascular:     Rate and Rhythm: Normal rate.  Pulmonary:     Effort: Pulmonary effort is normal. No respiratory distress.  Abdominal:     General: There is no distension.   Musculoskeletal:        General:  Normal range of motion.     Cervical back: Normal range of motion.   Skin:    General: Skin is warm and dry.     Findings: No erythema.   Neurological:     Mental Status: He is alert and oriented to person, place, and time.     Cranial Nerves: No cranial nerve deficit.     Motor: No abnormal muscle tone.     Comments: Quadriplegia  Psychiatric:        Mood and Affect: Mood and affect normal.      LABORATORY DATA:  I  have reviewed the data as listed    Latest Ref Rng & Units 08/07/2023   10:37 AM 05/08/2023   10:46 AM 02/06/2023   10:43 AM  CBC  WBC 4.0 - 10.5 K/uL 6.8  4.2  4.3   Hemoglobin 13.0 - 17.0 g/dL 78.2  95.6  21.3   Hematocrit 39.0 - 52.0 % 35.5  37.7  37.4   Platelets 150 - 400 K/uL 197  193  261       Latest Ref Rng & Units 08/07/2023   10:37 AM 07/10/2023    2:20 PM 05/22/2023    2:17 PM  CMP  Glucose 70 - 99 mg/dL 93  78  76   BUN 8 - 23 mg/dL 8  9  14    Creatinine 0.61 - 1.24 mg/dL 0.86  5.78  4.69   Sodium 135 - 145 mmol/L 132  130  141   Potassium 3.5 - 5.1 mmol/L 3.9  4.0  4.0   Chloride 98 - 111 mmol/L 98  94  102   CO2 22 - 32 mmol/L 27  24  26    Calcium  8.9 - 10.3 mg/dL 8.3  8.5  8.9   Total Protein 6.5 - 8.1 g/dL 6.2     Total Bilirubin 0.0 - 1.2 mg/dL 1.2     Alkaline Phos 38 - 126 U/L 102     AST 15 - 41 U/L 31     ALT 0 - 44 U/L 42        RADIOGRAPHIC STUDIES: I have personally reviewed the radiological images as listed and agreed with the findings in the report. No results found.

## 2023-08-07 NOTE — Telephone Encounter (Signed)
 Pt c/o medication issue:  1. Name of Medication:   sacubitril-valsartan (ENTRESTO ) 24-26 MG   2. How are you currently taking this medication (dosage and times per day)?   3. Are you having a reaction (difficulty breathing--STAT)?   4. What is your medication issue?   Caller [facility]) wants to get approval to dispense patient's medication for 90 days.

## 2023-08-07 NOTE — Assessment & Plan Note (Signed)
 Provoked, continue Aspirin 162 mg daily for prophylaxis.

## 2023-08-07 NOTE — Progress Notes (Signed)
 Suprapubic Cath Change  Patient is present today for a suprapubic catheter change due to urinary retention.  8ml of water was drained from the balloon, a 16FR Silastic foley cath was removed from the tract with out difficulty.  Site was cleaned and prepped in a sterile fashion with betadine.  A 16FR Silastic foley cath was replaced into the tract no complications were noted. Urine return was noted, 10 ml of sterile water was inflated into the balloon and a night bag was attached for drainage.  Patient tolerated well.   Performed by: Phoebie Shad, PA-C   Additional notes: He reports several days of increased intensity and frequency bladder spasms, and increased spasticity. No fevers. Urine sample obtained from new SPT today for culture, will start empiric Amoxicillin  875mg  BID x7 days for presumed UTI.  Follow up: Return in about 4 weeks (around 09/04/2023) for SPT exchange.

## 2023-08-07 NOTE — Assessment & Plan Note (Signed)
 Slightly decreased hemoglobin, macrocytosis.  Recommend empiric B12 supplementation 1000mcg daily Check B12 and Folate

## 2023-08-07 NOTE — Assessment & Plan Note (Signed)
 On Eligard  45mg  Q6 months. Today, and next dose due Dec 2025

## 2023-08-10 ENCOUNTER — Telehealth: Payer: Self-pay

## 2023-08-10 DIAGNOSIS — R972 Elevated prostate specific antigen [PSA]: Secondary | ICD-10-CM

## 2023-08-10 DIAGNOSIS — C61 Malignant neoplasm of prostate: Secondary | ICD-10-CM

## 2023-08-10 NOTE — Telephone Encounter (Signed)
-----   Message from Timmy Forbes sent at 08/07/2023  8:39 PM EDT ----- PSA increased. Please arrange pt to get PSMA PET -routine. Thanks.

## 2023-08-10 NOTE — Telephone Encounter (Signed)
 Please schedule PSMA PET and notify pt of appt details.

## 2023-08-11 ENCOUNTER — Ambulatory Visit: Payer: Self-pay | Admitting: Physician Assistant

## 2023-08-11 LAB — CULTURE, URINE COMPREHENSIVE

## 2023-08-14 LAB — BASIC METABOLIC PANEL WITH GFR
BUN/Creatinine Ratio: 20 (ref 10–24)
BUN: 10 mg/dL (ref 8–27)
CO2: 23 mmol/L (ref 20–29)
Calcium: 8.8 mg/dL (ref 8.6–10.2)
Chloride: 97 mmol/L (ref 96–106)
Creatinine, Ser: 0.5 mg/dL — ABNORMAL LOW (ref 0.76–1.27)
Glucose: 90 mg/dL (ref 70–99)
Potassium: 3.9 mmol/L (ref 3.5–5.2)
Sodium: 136 mmol/L (ref 134–144)
eGFR: 113 mL/min/{1.73_m2} (ref >59)

## 2023-08-15 ENCOUNTER — Ambulatory Visit: Payer: Self-pay | Admitting: Internal Medicine

## 2023-08-17 ENCOUNTER — Ambulatory Visit
Admission: RE | Admit: 2023-08-17 | Discharge: 2023-08-17 | Disposition: A | Discharge status: Home / Self Care | Source: Ambulatory Visit | Attending: Oncology | Admitting: Oncology

## 2023-08-17 ENCOUNTER — Encounter: Payer: Self-pay | Admitting: Internal Medicine

## 2023-08-17 DIAGNOSIS — C7951 Secondary malignant neoplasm of bone: Secondary | ICD-10-CM | POA: Insufficient documentation

## 2023-08-17 DIAGNOSIS — C61 Malignant neoplasm of prostate: Secondary | ICD-10-CM | POA: Diagnosis present

## 2023-08-17 DIAGNOSIS — R9721 Rising PSA following treatment for malignant neoplasm of prostate: Secondary | ICD-10-CM | POA: Insufficient documentation

## 2023-08-17 DIAGNOSIS — R972 Elevated prostate specific antigen [PSA]: Secondary | ICD-10-CM

## 2023-08-17 MED ORDER — FLOTUFOLASTAT F 18 GALLIUM 296-5846 MBQ/ML IV SOLN
7.9100 | Freq: Once | INTRAVENOUS | Status: AC
Start: 2023-08-17 — End: 2023-08-17
  Administered 2023-08-17: 7.91 via INTRAVENOUS
  Filled 2023-08-17: qty 8

## 2023-08-19 ENCOUNTER — Encounter: Payer: Self-pay | Admitting: Internal Medicine

## 2023-08-19 ENCOUNTER — Ambulatory Visit (INDEPENDENT_AMBULATORY_CARE_PROVIDER_SITE_OTHER): Admitting: Internal Medicine

## 2023-08-19 ENCOUNTER — Other Ambulatory Visit: Payer: Self-pay | Admitting: Nurse Practitioner

## 2023-08-19 VITALS — BP 120/64 | HR 60 | Resp 12

## 2023-08-19 DIAGNOSIS — Z9359 Other cystostomy status: Secondary | ICD-10-CM

## 2023-08-19 DIAGNOSIS — G8220 Paraplegia, unspecified: Secondary | ICD-10-CM | POA: Diagnosis not present

## 2023-08-19 DIAGNOSIS — R252 Cramp and spasm: Secondary | ICD-10-CM

## 2023-08-19 DIAGNOSIS — I5020 Unspecified systolic (congestive) heart failure: Secondary | ICD-10-CM

## 2023-08-19 DIAGNOSIS — F39 Unspecified mood [affective] disorder: Secondary | ICD-10-CM

## 2023-08-19 MED ORDER — CLONAZEPAM 0.5 MG PO TABS: 0.5000 mg | ORAL_TABLET | Freq: Two times a day (BID) | ORAL | 0 refills | Status: AC

## 2023-08-19 NOTE — Progress Notes (Signed)
 Subjective:    Patient ID: Jason [name], male    DOB: Jul 12, 1955, 68 y.o.   MRN: [id number]  HPI Follow up of multiple medical conditions related to metastatic prostate cancer Wife is here as usual  Recent oncology visit  PSA went up a bit PET scan done because of this--marked improvement from 2023 but still persistent  bony mets Got lupron  injection then---also on darolutamide  daily Wants the lupron  injection site checked (on abdomen)  Having trouble with excessive stool --coming around diaper towards penis in the morning Decreased the senna Stool is actually some looser since then Still needs the suppository and digital stimulation at night  Still getting weekly PT Does standing (and balance training not holding walker), mini-squats, etc Not walking yet even with help Right leg remains weaker Still remains fairly independent with eating/brushing teeth/combing hair. No more OT for a while RN visits as well Wife still gets help from neighbors with transfers using the Hoyer---twice a day generally  Pressure would on sacrum has opened again Colmesptine again Perianal rash also---zinc mixed with powder is working  Current Outpatient Medications on File Prior to Visit  Medication Sig Dispense Refill   sennosides-docusate sodium  (SENOKOT-S) 8.6-50 MG tablet Take 2 tablets by mouth daily. (Patient taking differently: Take 1 tablet by mouth daily.) 60 tablet 11   acetaminophen  (TYLENOL ) 500 MG tablet Take 1,000 mg by mouth 2 (two) times daily.     ascorbic acid  (VITAMIN C ) 500 MG tablet Take 1 tablet (500 mg total) by mouth daily. 30 tablet 0   aspirin  EC 81 MG tablet Take 1 tablet (81 mg total) by mouth daily. Swallow whole. 30 tablet 12   atorvastatin  (LIPITOR) 40 MG tablet Take 1 tablet (40 mg total) by mouth daily. 90 tablet 3   baclofen  (LIORESAL ) 10 MG tablet Take 0.5-1 tablets (5-10 mg total) by mouth 2 (two) times daily as needed for muscle spasms. 60 each 5   cetirizine  (ZYRTEC) 10 MG tablet Take 10 mg by mouth at bedtime.     clonazePAM  (KLONOPIN ) 0.5 MG tablet Take 1 tablet by mouth twice daily 60 tablet 0   CRANBERRY PO Take 1 tablet by mouth 2 (two) times daily.     darolutamide  (NUBEQA ) 300 MG tablet Take 2 tablets (600 mg total) by mouth 2 (two) times daily with a meal. 120 tablet 3   famotidine  (PEPCID ) 20 MG tablet Take 1 tablet (20 mg total) by mouth daily. 30 tablet 0   FLUoxetine  (PROZAC ) 20 MG capsule Take 1 capsule (20 mg total) by mouth at bedtime. 90 capsule 3   fluticasone  (FLONASE ) 50 MCG/ACT nasal spray Place 1 spray into both nostrils daily. 48 g 3   guaiFENesin  (MUCINEX ) 600 MG 12 hr tablet Take 600 mg by mouth 2 (two) times daily.     levothyroxine  (SYNTHROID ) 88 MCG tablet Take 1 tablet (88 mcg total) by mouth daily. 90 tablet 3   metoprolol  succinate (TOPROL -XL) 25 MG 24 hr tablet Take 1 tablet (25 mg total) by mouth at bedtime. Take with or immediately following a meal. 90 tablet 3   Multiple Vitamin (MULTIVITAMIN) tablet Take 1 tablet by mouth daily.     potassium chloride  SA (KLOR-CON  M) 20 MEQ tablet Take 1 tablet (20 mEq total) by mouth at bedtime. 90 tablet 3   sacubitril-valsartan (ENTRESTO ) 24-26 MG Take 1 tablet by mouth 2 (two) times daily. 180 tablet 3   No current facility-administered medications on file prior to visit.  No Known Allergies  Past Medical History:  Diagnosis Date   Allergy    Arthritis    osteoarthritis   Cancer (HCC)    prostate   CHF (congestive heart failure) (HCC)    GERD (gastroesophageal reflux disease)    History of kidney stones    passed   Hypertension    Hypothyroidism    Palpitations 2022   Paralysis (HCC)    paraplegia 09/2021-surgery or stroke   Pneumonia    12/22/28- > 25 years ago   Stroke [facility])    Mini stroke - Aug 2023   Thyroid  disease    Hyperthyroidism s/p radioactive iodine ablation    Past Surgical History:  Procedure Laterality Date   CARDIAC CATHETERIZATION   03/2000   HERNIA REPAIR  1961   umbilical   IR CATHETER TUBE CHANGE  12/12/2022   IR US  GUIDE BX ASP/DRAIN  11/11/2022   LEFT HEART CATH AND CORONARY ANGIOGRAPHY Left 03/20/2020   Procedure: LEFT HEART CATH AND CORONARY ANGIOGRAPHY;  Surgeon: Mady Bruckner, MD;  Location: [facility] INVASIVE CV LAB;  Service: Cardiovascular;  Laterality: Left;   POSTERIOR CERVICAL FUSION/FORAMINOTOMY N/A 10/06/2021   Procedure: POSTERIOR CERVICAL FUSION/ FORAMINOTOMY LEVEL 3;  Surgeon: Clois Fret, MD;  Location: [facility] ORS;  Service: Neurosurgery;  Laterality: N/A;  will need monitoring   RADIOLOGY WITH ANESTHESIA Left 04/18/2021   Procedure: MRI SHOULDER WITHOUT CONTRAST WITH ANESTHESIA;  Surgeon: Radiologist, Medication, MD;  Location: [facility] OR;  Service: Radiology;  Laterality: Left;   RADIOLOGY WITH ANESTHESIA N/A 10/03/2021   Procedure: MRI CERVICAL SPINE WITH ANESTHESIA;  Surgeon: Radiologist, Medication, MD;  Location: [facility] OR;  Service: Radiology;  Laterality: N/A;   RADIOLOGY WITH ANESTHESIA N/A 01/30/2022   Procedure: MRI CERVICAL SPINE WITH AND WITOHOUT CONTRAST WITH ANESTHESIA;  Surgeon: Radiologist, Medication, MD;  Location: [facility] OR;  Service: Radiology;  Laterality: N/A;   RADIOLOGY WITH ANESTHESIA N/A 05/22/2022   Procedure: MRI LUMBER SPINE AND CERVIC SPINE WITH AND WITHOUT CONTRAST;  Surgeon: Radiologist, Medication, MD;  Location: [facility] OR;  Service: Radiology;  Laterality: N/A;   RADIOLOGY WITH ANESTHESIA N/A 12/25/2022   Procedure: MRI WITH ANESTHESIA OF CERVICAL SPINE WITH AND WITHOUT CONTRAST,CARDIAC MORPHOLOGY;  Surgeon: Radiologist, Medication, MD;  Location: [facility] OR;  Service: Radiology;  Laterality: N/A;   REVERSE SHOULDER ARTHROPLASTY Left    ROTATOR CUFF REPAIR  11/2010   Dr Cleotilde   SUPRAPUBIC CATHETER INSERTION  11/11/2022   TONSILLECTOMY     TOTAL HIP ARTHROPLASTY Right 02/24/2009   TOTAL HIP ARTHROPLASTY Left 06/2016   Dr Leora    Family History  Problem Relation Age of Onset    Hypertension Mother    Stroke Mother    Gout Father    Heart attack Father 40   Heart disease Sister    Lung cancer Sister    Drug abuse Sister    Breast cancer Paternal Aunt    Lung cancer Maternal Grandmother    Colon cancer Neg Hx    Esophageal cancer Neg Hx    Rectal cancer Neg Hx    Stomach cancer Neg Hx     Social History   Socioeconomic History   Marital status: Married    Spouse name: [name]    Number of children: 0   Years of education: Not on file   Highest education level: Bachelor's degree (e.g., BA, AB, BS)  Occupational History   Occupation: Warden/ranger: [employer]  Tobacco Use   Smoking status: Never  Passive exposure: Never   Smokeless tobacco: Never  Vaping Use   Vaping status: Never Used  Substance and Sexual Activity   Alcohol use: Not Currently   Drug use: No   Sexual activity: Not Currently  Other Topics Concern   Not on file  Social History Narrative   Lives at home with spouse.    Social Drivers of Corporate investment banker Strain: Low Risk  (08/18/2023)   Overall Financial Resource Strain (CARDIA)    Difficulty of Paying Living Expenses: Not hard at all  Food Insecurity: No Food Insecurity (08/18/2023)   Hunger Vital Sign    Worried About Running Out of Food in the Last Year: Never true    Ran Out of Food in the Last Year: Never true  Transportation Needs: No Transportation Needs (08/18/2023)   PRAPARE - Administrator, Civil Service (Medical): No    Lack of Transportation (Non-Medical): No  Physical Activity: Inactive (08/18/2023)   Exercise Vital Sign    Days of Exercise per Week: 0 days    Minutes of Exercise per Session: Not on file  Stress: No Stress Concern Present (08/18/2023)   [employer] of Occupational Health - Occupational Stress Questionnaire    Feeling of Stress: Only a little  Social Connections: Moderately Isolated (08/18/2023)   Social Connection and Isolation Panel    Frequency of  Communication with Friends and Family: More than three times a week    Frequency of Social Gatherings with Friends and Family: More than three times a week    Attends Religious Services: Never    Database administrator or Organizations: No    Attends Engineer, structural: Not on file    Marital Status: Married  Catering manager Violence: Not on file   Review of Systems Appetite is very good May have gained a few pounds--per wife Suprapubic catheter is working well---was on amoxil  from urologist (noted abd pressure and increased spasticity).  No sig pain issues Sleeps well--baclofen  helps muscle spasm and sleep    Objective:   Physical Exam Constitutional:      Appearance: Normal appearance.     Comments: In wheelchair   Cardiovascular:     Rate and Rhythm: Normal rate and regular rhythm.     Heart sounds: No murmur heard.    No gallop.  Pulmonary:     Effort: Pulmonary effort is normal.     Breath sounds: Normal breath sounds. No wheezing or rales.  Abdominal:     Palpations: Abdomen is soft.     Tenderness: There is no abdominal tenderness.   Musculoskeletal:     Cervical back: Neck supple.     Right lower leg: No edema.     Left lower leg: No edema.  Lymphadenopathy:     Cervical: No cervical adenopathy.   Skin:    Comments: Small hematoma and bruising in left abdomen at injection site   Neurological:     Mental Status: He is alert.     Comments: 4/5 strength in arms Left leg 3+/5, right leg 2+/5  Psychiatric:        Mood and Affect: Mood normal.        Behavior: Behavior normal.            Assessment & Plan:

## 2023-08-19 NOTE — Assessment & Plan Note (Signed)
 Mostly over the reactive depression  Still on fluoxetine  per physiatry--may help with rehab

## 2023-08-19 NOTE — Assessment & Plan Note (Signed)
No problems with this 

## 2023-08-19 NOTE — Assessment & Plan Note (Signed)
 Uses the baclofen --mostly at bedtime

## 2023-08-19 NOTE — Assessment & Plan Note (Signed)
 Functional Continues to work with PT but improvement has slowed down

## 2023-08-19 NOTE — Assessment & Plan Note (Signed)
 Improved and only slightly low On entresto  and metoprolol 

## 2023-08-21 ENCOUNTER — Ambulatory Visit: Attending: Internal Medicine | Admitting: Internal Medicine

## 2023-08-21 ENCOUNTER — Encounter: Payer: Self-pay | Admitting: Internal Medicine

## 2023-08-21 VITALS — BP 120/60 | HR 51 | Ht 69.0 in | Wt 155.0 lb

## 2023-08-21 DIAGNOSIS — I5022 Chronic systolic (congestive) heart failure: Secondary | ICD-10-CM | POA: Insufficient documentation

## 2023-08-21 DIAGNOSIS — I493 Ventricular premature depolarization: Secondary | ICD-10-CM | POA: Insufficient documentation

## 2023-08-21 DIAGNOSIS — I428 Other cardiomyopathies: Secondary | ICD-10-CM | POA: Insufficient documentation

## 2023-08-21 DIAGNOSIS — I10 Essential (primary) hypertension: Secondary | ICD-10-CM | POA: Diagnosis present

## 2023-08-21 DIAGNOSIS — I77819 Aortic ectasia, unspecified site: Secondary | ICD-10-CM | POA: Insufficient documentation

## 2023-08-21 DIAGNOSIS — Q2112 Patent foramen ovale: Secondary | ICD-10-CM | POA: Insufficient documentation

## 2023-08-21 NOTE — Progress Notes (Unsigned)
 Cardiology Office Note:  .   Date:  08/22/2023  ID:  Jason [name], DOB 23-May-1955, MRN [id number] PCP: Jason Charlie FERNS, MD  [facility] Providers Cardiologist:  Jason Hanson, MD Electrophysiologist:  Jason ONEIDA HOLTS, MD     History of Present Illness: .   Jason [name] is a 68 y.o. 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  He was last seen in our office by Jason [name], Jason [name], in 04/2023, at which time he was feeling better with improved HR and BP since metoprolol  succinate was decreased last year.  He had not heard back yet about insurance approval of empagliflozin .  He was started on spironolactone  12.5 mg daily, though this was subsequently discontinued due to hyponatremia that has since resolved.  Jason [name] reports that he is feeling well from a heart standpoint, denying chest pain, shortness of breath, palpitations, lightheadedness, and edema (he continues to wear compression stockings).  He was recently treated for a UTI (he has a suprapubic catheter in place).  ROS: See HPI  Studies Reviewed: Jason [name]   EKG Interpretation Date/Time:  Friday August 21 2023 09:52:03 EDT Ventricular Rate:  51 PR Interval:  178 QRS Duration:  108 QT Interval:  494 QTC Calculation: 455 R Axis:   68  Text Interpretation: Sinus bradycardia Nonspecific ST and T wave abnormality Abnormal ECG When compared with ECG of 12-Dec-2022 13:19, Premature ventricular complexes are no longer Present QT has lengthened Confirmed by Jason [name], Jason [phone]) on 08/22/2023 5:55:53 PM    Cardiac MRI (12/25/2022): LVEF 43% with mildly increased ECV signal; no abnormal late gadolinium hyperenhancement suggestive of high risk scar.  PFO noted.  Mildly dilated aorta noted (4.0 cm).  Risk Assessment/Calculations:              Physical Exam:   VS:  BP 120/60 (BP Location: Left Arm, Patient Position: Sitting, Cuff Size: Normal)   Pulse (!) 51   Ht 5' 9 (1.753 m)   Wt 155 lb (70.3 kg)   SpO2 95%   BMI 22.89 kg/m    Wt Readings from Last 3 Encounters:  08/21/23 155 lb (70.3 kg)  08/07/23 155 lb (70.3 kg)  07/24/23 155 lb (70.3 kg)    General:  NAD.  Patient in a power wheelchair and accompanied by his wife. Neck: No JVD or HJR. Lungs: Clear to auscultation bilaterally without wheezes or crackles. Heart: Regular rate and rhythm without murmurs, rubs, or gallops. Abdomen: Soft, nontender, nondistended.  Suprapubic catheter noted. Extremities: No lower extremity edema with compression stockings in place.  ASSESSMENT AND PLAN: .    Chronic HFrEF due to nonischemic cardiomyopathy: Thought to be nonischemic in nature, either due to prior chemotherapy and/or frequent PVC's.  He remains asymptomatic from a HF standpoint, though he is wheelchair-bound due to his paraplegia.  Continue current doses of metoprolol  succinate and Entresto , as soft BP/HR with higher doses has been limiting factors in the past.  We will not rechallenge him with spironolactone  given associated hyponatremia.  We discussed addition of an SGLT-2 inhibitor (previously prescribed but not authorized by his insurance).  However, given recent UTI with indwelling suprapubic catheter, I think the risks of an SGLT-2 inhibitor outweigh the benefits.  We will reassess his LVEF with an echocardiogram at his convenience.  If his LVEF has declined again, he may benefit  from AHF consultation.   PVC's: No ectopy noted on today's EKG.  Continue current dose of metoprolol  and ongoing EP follow-up.  Dilated thoracic aorta: Mildly dilated thoracic aorta noted on cardiac MRI.  We will reassess with echocardiogram.  Continue BP control.  Fluoroquinolone antibiotics should be avoided, if possible.  PFO: Incidentally noted on cardiac MRI.  No new neurologic  deficits noted.  Continue with aspirin  81 mg daily.  Hypertension: BP well-controlled.  Continue current regimen of metoprolol  succinate and Entresto .    Dispo: Return to clinic in 3 months.  Signed, Jason Hanson, MD

## 2023-08-21 NOTE — Patient Instructions (Signed)
 Medication Instructions:  Your physician recommends that you continue on your current medications as directed. Please refer to the Current Medication list given to you today.    *If you need a refill on your cardiac medications before your next appointment, please call your pharmacy*  Lab Work: No labs ordered today    Testing/Procedures: Your physician has requested that you have an echocardiogram. Echocardiography is a painless test that uses sound waves to create images of your heart. It provides your doctor with information about the size and shape of your heart and how well your heart's chambers and valves are working.   You may receive an ultrasound enhancing agent through an IV if needed to better visualize your heart during the echo. This procedure takes approximately one hour.  There are no restrictions for this procedure.  This will take place at [address] [facility], [place] [address]  Please note: We ask at that you not bring children with you during ultrasound (echo/ vascular) testing. Due to room size and safety concerns, children are not allowed in the ultrasound rooms during exams. Our front office staff cannot provide observation of children in our lobby area while testing is being conducted. An adult accompanying a patient to their appointment will only be allowed in the ultrasound room at the discretion of the ultrasound technician under special circumstances. We apologize for any inconvenience.   Follow-Up: At [facility], you and your health needs are our priority.  As part of our continuing mission to provide you with exceptional heart care, our providers are all part of one team.  This team includes your primary Cardiologist (physician) and Advanced Practice Providers or APPs (Physician Assistants and Nurse Practitioners) who all work together to provide you with the care you need, when you need it.  Your next appointment:   3  month(s)  Provider:   Lonni Hanson, MD or Cadence Franchester, PA-C

## 2023-08-22 ENCOUNTER — Encounter: Payer: Self-pay | Admitting: Internal Medicine

## 2023-08-22 DIAGNOSIS — I428 Other cardiomyopathies: Secondary | ICD-10-CM | POA: Insufficient documentation

## 2023-08-22 DIAGNOSIS — I77819 Aortic ectasia, unspecified site: Secondary | ICD-10-CM | POA: Insufficient documentation

## 2023-08-22 DIAGNOSIS — I5022 Chronic systolic (congestive) heart failure: Secondary | ICD-10-CM | POA: Insufficient documentation

## 2023-08-22 DIAGNOSIS — Q2112 Patent foramen ovale: Secondary | ICD-10-CM | POA: Insufficient documentation

## 2023-08-24 ENCOUNTER — Encounter: Payer: Self-pay | Admitting: Internal Medicine

## 2023-09-02 ENCOUNTER — Encounter: Payer: Self-pay | Admitting: Physical Medicine and Rehabilitation

## 2023-09-03 ENCOUNTER — Encounter: Payer: Self-pay | Admitting: Oncology

## 2023-09-03 ENCOUNTER — Ambulatory Visit (INDEPENDENT_AMBULATORY_CARE_PROVIDER_SITE_OTHER): Admitting: Physician Assistant

## 2023-09-03 VITALS — BP 122/70 | HR 58 | Ht 69.0 in | Wt 155.0 lb

## 2023-09-03 DIAGNOSIS — Z435 Encounter for attention to cystostomy: Secondary | ICD-10-CM

## 2023-09-03 DIAGNOSIS — R310 Gross hematuria: Secondary | ICD-10-CM | POA: Diagnosis not present

## 2023-09-03 LAB — URINALYSIS, COMPLETE
Bilirubin, UA: NEGATIVE
Glucose, UA: NEGATIVE
Ketones, UA: NEGATIVE
Nitrite, UA: POSITIVE — AB
Protein,UA: NEGATIVE
Specific Gravity, UA: 1.01 (ref 1.005–1.030)
Urobilinogen, Ur: 1 mg/dL (ref 0.2–1.0)
pH, UA: 6.5 (ref 5.0–7.5)

## 2023-09-03 LAB — MICROSCOPIC EXAMINATION
RBC, Urine: >30 /HPF — AB (ref 0–2)
WBC, UA: >30 /HPF — AB (ref 0–5)

## 2023-09-03 MED ORDER — CEFUROXIME AXETIL 250 MG PO TABS: 250.0000 mg | ORAL_TABLET | Freq: Two times a day (BID) | ORAL | 0 refills | Status: AC

## 2023-09-03 NOTE — Progress Notes (Signed)
 Suprapubic Cath Change  Patient is present today for a suprapubic catheter change due to urinary retention.  8ml of water was drained from the balloon, a 16FR Silastic foley cath was removed from the tract with out difficulty.  Site was cleaned and prepped in a sterile fashion with betadine.  A 16FR Silastic foley cath was replaced into the tract no complications were noted. Urine return was noted, 10 ml of sterile water was inflated into the balloon and a night bag was attached for drainage.  Patient tolerated well.  Performed by: Sohil Timko, PA-C   Additional notes: They noticed pink urine and pink sediment in his tubing today. He reports fatigue x1 day and increased spasticity x2 days. Urine sample obtained from new Foley today for UA/Cx. Starting empiric cefuroxime  for UTI. If hematuria persists despite abx, will recommend cysto.  Follow up: Return in about 4 weeks (around 10/01/2023) for SPT exchange.

## 2023-09-04 ENCOUNTER — Ambulatory Visit: Attending: Internal Medicine

## 2023-09-04 DIAGNOSIS — I428 Other cardiomyopathies: Secondary | ICD-10-CM | POA: Insufficient documentation

## 2023-09-04 LAB — ECHOCARDIOGRAM COMPLETE
AV Mean grad: 7 mmHg
AV Peak grad: 13.2 mmHg
Ao pk vel: 1.82 m/s
Area-P 1/2: 3.17 cm2

## 2023-09-07 ENCOUNTER — Ambulatory Visit: Payer: Self-pay | Admitting: Urology

## 2023-09-07 ENCOUNTER — Ambulatory Visit: Payer: Self-pay | Admitting: Internal Medicine

## 2023-09-07 LAB — CULTURE, URINE COMPREHENSIVE

## 2023-09-09 ENCOUNTER — Encounter: Payer: Self-pay | Admitting: Urology

## 2023-09-11 ENCOUNTER — Ambulatory Visit: Admitting: Physician Assistant

## 2023-09-21 ENCOUNTER — Telehealth: Payer: Self-pay

## 2023-09-21 NOTE — Telephone Encounter (Signed)
 Copied from CRM [id number]. Topic: Clinical - Home Health Verbal Orders >> Sep 21, 2023 10:14 AM Kevelyn M wrote: Caller/Agency: [name]/[facility] Callback Number: [phone] Service Requested: Physical Therapy Frequency: 1 week 9 Any new concerns about the patient? Yes Level 2 interaction

## 2023-09-21 NOTE — Telephone Encounter (Signed)
 Verbal orders left on verified VM for [facility]. Advised him of the new PCP from now on.

## 2023-09-22 ENCOUNTER — Telehealth: Payer: Self-pay

## 2023-09-22 NOTE — Telephone Encounter (Signed)
 Called and spoke to [place] and advised her that he is now seeing a PCP with [facility]. She will reach out to them.

## 2023-09-22 NOTE — Telephone Encounter (Signed)
 Opened in error

## 2023-09-22 NOTE — Telephone Encounter (Signed)
 Copied from CRM [id number]. Topic: Clinical - Home Health Verbal Orders >> Sep 22, 2023  1:11 PM Chiquita SQUIBB wrote: Caller/Agency: Alan from [facility]  Callback Number: [phone] Option 2 Service Requested: Physical Therapy Frequency: Once a week for 8 weeks Any new concerns about the patient? No

## 2023-09-24 ENCOUNTER — Other Ambulatory Visit: Payer: Self-pay | Admitting: Internal Medicine

## 2023-09-26 ENCOUNTER — Other Ambulatory Visit: Payer: Self-pay | Admitting: Internal Medicine

## 2023-09-30 ENCOUNTER — Telehealth: Payer: Self-pay | Admitting: Internal Medicine

## 2023-09-30 NOTE — Telephone Encounter (Signed)
 Copied from CRM #[id number]. Topic: General - Call Back - No Documentation >> Sep 30, 2023 11:41 AM Rea BROCKS wrote: Reason for CRM: [facility] called in to verify if patient is still seen under Dr. Jimmy or not.   [phone], option 2 Tiffany with [facility]- would like a call back from [employer] because the dates for when he was no longer a patient are shown differently.

## 2023-09-30 NOTE — Telephone Encounter (Signed)
 Spoke to [employer]. Advised her that the chart has [facility] as PCP as of 09-02-23.

## 2023-10-05 ENCOUNTER — Ambulatory Visit: Admitting: Physician Assistant

## 2023-10-05 DIAGNOSIS — Z435 Encounter for attention to cystostomy: Secondary | ICD-10-CM

## 2023-10-05 DIAGNOSIS — R339 Retention of urine, unspecified: Secondary | ICD-10-CM

## 2023-10-05 NOTE — Progress Notes (Signed)
 Suprapubic Cath Change  Patient is present today for a suprapubic catheter change due to urinary retention.  8ml of water was drained from the balloon, a 16FR Silastic foley cath was removed from the tract without difficulty.  Site was cleaned and prepped in a sterile fashion with betadine.  A 16FR foley cath was replaced into the tract no complications were noted. Urine return was noted, 10 ml of sterile water was inflated into the balloon and a night bag was attached for drainage.  Patient tolerated well.  Performed by: Muhammadali Ries, PA-C   Follow up: Return in about 4 weeks (around 11/02/2023) for SPT exchange.

## 2023-10-20 ENCOUNTER — Encounter: Payer: Self-pay | Admitting: Internal Medicine

## 2023-11-03 ENCOUNTER — Other Ambulatory Visit: Payer: Self-pay

## 2023-11-03 DIAGNOSIS — C61 Malignant neoplasm of prostate: Secondary | ICD-10-CM

## 2023-11-03 MED ORDER — NUBEQA 300 MG PO TABS: 600.0000 mg | ORAL_TABLET | Freq: Two times a day (BID) | ORAL | 3 refills | Status: AC

## 2023-11-04 ENCOUNTER — Ambulatory Visit (INDEPENDENT_AMBULATORY_CARE_PROVIDER_SITE_OTHER): Admitting: Urology

## 2023-11-04 VITALS — BP 133/72 | HR 50

## 2023-11-04 DIAGNOSIS — N319 Neuromuscular dysfunction of bladder, unspecified: Secondary | ICD-10-CM | POA: Diagnosis not present

## 2023-11-04 DIAGNOSIS — C61 Malignant neoplasm of prostate: Secondary | ICD-10-CM | POA: Diagnosis not present

## 2023-11-04 NOTE — Progress Notes (Signed)
 Suprapubic Cath Change  Patient is present today for a suprapubic catheter change due to urinary retention. 8ml of water was drained from the balloon, a 16FR foley cath was removed from the tract with out difficulty.  Site was cleaned and prepped in a sterile fashion with betadine.  A 16FR Silastic foley cath was replaced into the tract no complications were noted. Urine return was noted, 10 ml of sterile water was inflated into the balloon and a night bag was attached for drainage. Patient tolerated well.   Performed by: Iyona Pehrson, CMA (AAMA)   Follow up: RTC in October for scheduled SPT exchange, see [place] PRN.

## 2023-11-04 NOTE — Progress Notes (Signed)
   11/04/2023 10:36 AM   Toribio FORBES Mam [birth year] [id number]  Reason for visit: Follow up metastatic prostate cancer, neurogenic bladder  History: Originally diagnosed with metastatic prostate cancer August 2023 with PSA >1500, C6 cervical laminectomy with resection of tumor in August 2023 showing metastatic prostate cancer, ultimately received palliative radiation to spine, has been wheelchair-bound since that time with neurogenic bladder Prostate cancer managed by oncology with ADT and darolutamide , recent PSA increasing Bladder has been managed with suprapubic tube History of UTIs, have improved with cranberry tablet prophylaxis  Physical Exam: BP 133/72 (BP Location: Left Arm, Patient Position: Sitting, Cuff Size: Normal)   Pulse (!) 50   SpO2 98%    Today: No significant urinary complaints, they would like to continue the SP tube to drainage and defer urodynamics or capping trial.  He has no control over his bowels, so agree would be low likelihood to pass a voiding trial  Plan:   Continue suprapubic tube to drainage, changed today, continue monthly changes Continue treatment for metastatic prostate cancer through oncology If interested in the future can pursue SP capping trial or urodynamics   [name], MD  [facility] [address], [address] [place], [place] [address] [phone]

## 2023-11-05 ENCOUNTER — Ambulatory Visit: Payer: Self-pay | Admitting: Urology

## 2023-11-13 ENCOUNTER — Inpatient Hospital Stay: Attending: Anatomic Pathology & Clinical Pathology

## 2023-11-13 ENCOUNTER — Inpatient Hospital Stay: Admitting: Oncology

## 2023-11-13 VITALS — BP 112/67 | HR 56 | Temp 97.8°F | Resp 18

## 2023-11-13 DIAGNOSIS — Z86718 Personal history of other venous thrombosis and embolism: Secondary | ICD-10-CM

## 2023-11-13 DIAGNOSIS — C61 Malignant neoplasm of prostate: Secondary | ICD-10-CM

## 2023-11-13 DIAGNOSIS — C7940 Secondary malignant neoplasm of unspecified part of nervous system: Secondary | ICD-10-CM

## 2023-11-13 DIAGNOSIS — D539 Nutritional anemia, unspecified: Secondary | ICD-10-CM

## 2023-11-13 LAB — CBC WITH DIFFERENTIAL (CANCER CENTER ONLY)
Abs Immature Granulocytes: 0.02 K/uL (ref 0.00–0.07)
Basophils Absolute: 0.1 K/uL (ref 0.0–0.1)
Basophils Relative: 1 %
Eosinophils Absolute: 0.2 K/uL (ref 0.0–0.5)
Eosinophils Relative: 2 %
HCT: 35.9 % — ABNORMAL LOW (ref 39.0–52.0)
Hemoglobin: 11.9 g/dL — ABNORMAL LOW (ref 13.0–17.0)
Immature Granulocytes: 0 %
Lymphocytes Relative: 13 %
Lymphs Abs: 0.9 K/uL (ref 0.7–4.0)
MCH: 33.8 pg (ref 26.0–34.0)
MCHC: 33.1 g/dL (ref 30.0–36.0)
MCV: 102 fL — ABNORMAL HIGH (ref 80.0–100.0)
Monocytes Absolute: 0.5 K/uL (ref 0.1–1.0)
Monocytes Relative: 7 %
Neutro Abs: 5.5 K/uL (ref 1.7–7.7)
Neutrophils Relative %: 77 %
Platelet Count: 221 K/uL (ref 150–400)
RBC: 3.52 MIL/uL — ABNORMAL LOW (ref 4.22–5.81)
RDW: 13.1 % (ref 11.5–15.5)
WBC Count: 7.1 K/uL (ref 4.0–10.5)
nRBC: 0 % (ref 0.0–0.2)

## 2023-11-13 LAB — CMP (CANCER CENTER ONLY)
ALT: 35 U/L (ref 0–44)
AST: 32 U/L (ref 15–41)
Albumin: 3.5 g/dL (ref 3.5–5.0)
Alkaline Phosphatase: 102 U/L (ref 38–126)
Anion gap: 5 (ref 5–15)
BUN: 11 mg/dL (ref 8–23)
CO2: 28 mmol/L (ref 22–32)
Calcium: 8.5 mg/dL — ABNORMAL LOW (ref 8.9–10.3)
Chloride: 100 mmol/L (ref 98–111)
Creatinine: 0.48 mg/dL — ABNORMAL LOW (ref 0.61–1.24)
GFR, Estimated: >60 mL/min (ref >60)
Glucose, Bld: 86 mg/dL (ref 70–99)
Potassium: 4 mmol/L (ref 3.5–5.1)
Sodium: 133 mmol/L — ABNORMAL LOW (ref 135–145)
Total Bilirubin: 1.1 mg/dL (ref 0.0–1.2)
Total Protein: 6.2 g/dL — ABNORMAL LOW (ref 6.5–8.1)

## 2023-11-13 LAB — FOLATE: Folate: >20 ng/mL (ref >5.9)

## 2023-11-13 LAB — PSA: Prostatic Specific Antigen: 14.47 ng/mL — ABNORMAL HIGH (ref 0.00–4.00)

## 2023-11-13 LAB — VITAMIN B12: Vitamin B-12: 1180 pg/mL — ABNORMAL HIGH (ref 180–914)

## 2023-11-13 NOTE — Assessment & Plan Note (Signed)
 Provoked, continue Aspirin 162 mg daily for prophylaxis.

## 2023-11-13 NOTE — Assessment & Plan Note (Addendum)
 Metastatic castration sensitive prostate cancer, with CNS involvement. Currently on chemoprevention therapy with Eligard .  Status post palliative radiation to spine. PMSA PET scan showed partial response. Previously on Xtandi  160mg  daily, switched to Darolutamide  600mg  BID due to cardiology issues/drug interactions.  Labs are reviewed and discussed with patient.  Continue Darolutamide  600mg  BID  PSA further increased to 14, possible disease progression.  Recommend to repeat PSMA PET scan

## 2023-11-16 ENCOUNTER — Ambulatory Visit: Payer: Self-pay | Admitting: Oncology

## 2023-11-16 DIAGNOSIS — R972 Elevated prostate specific antigen [PSA]: Secondary | ICD-10-CM

## 2023-11-16 DIAGNOSIS — C7940 Secondary malignant neoplasm of unspecified part of nervous system: Secondary | ICD-10-CM

## 2023-11-17 NOTE — Telephone Encounter (Signed)
-----   Message from Zelphia Cap sent at 11/16/2023 11:36 PM EDT ----- PSA has increased. Please arrange him to repeat PSMA PET and see me 1 week after PET. Thanks.  ----- Message ----- From: Rebecka, Lab [facility] [place] Sent: 11/13/2023  11:59 AM EDT To: Zelphia Cap, MD

## 2023-11-17 NOTE — Telephone Encounter (Signed)
 Please schedule PSMA PET and MD approx 1 weeks after scan. Please notify pt/ wife of appt details

## 2023-11-20 ENCOUNTER — Encounter: Payer: Self-pay | Admitting: Physical Medicine and Rehabilitation

## 2023-11-20 ENCOUNTER — Encounter: Attending: Physical Medicine and Rehabilitation | Admitting: Physical Medicine and Rehabilitation

## 2023-11-20 VITALS — BP 125/71 | HR 56 | Ht 69.0 in | Wt 155.0 lb

## 2023-11-20 DIAGNOSIS — Z9359 Other cystostomy status: Secondary | ICD-10-CM | POA: Insufficient documentation

## 2023-11-20 DIAGNOSIS — R252 Cramp and spasm: Secondary | ICD-10-CM | POA: Diagnosis not present

## 2023-11-20 DIAGNOSIS — G8250 Quadriplegia, unspecified: Secondary | ICD-10-CM | POA: Insufficient documentation

## 2023-11-20 DIAGNOSIS — Z993 Dependence on wheelchair: Secondary | ICD-10-CM | POA: Diagnosis present

## 2023-11-20 MED ORDER — BACLOFEN 10 MG PO TABS: 5.0000 mg | ORAL_TABLET | Freq: Two times a day (BID) | ORAL | 5 refills | Status: AC | PRN

## 2023-11-20 NOTE — Patient Instructions (Signed)
 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   Strength is mildly increased and Spasticity is mildly improved today.   2. Getting PET scan for increased PSA on 12/11/23.   3. Mood is up and down- still- esp with PET scan need.   4. Con't Baclofen  10 mg 2x/day as needed- #60- with 5 refills.   5. Gets Klonopin  from PCP-  which is fine- could help spasticity.   6. Con't Home Health and nurse  7. Con't Thigh high TEDs and PRAFOs at night  8. F/U in 3 months double apt- SCI

## 2023-11-20 NOTE — Progress Notes (Signed)
 Subjective:    Patient ID: Jason [name], male    DOB: 03-Jan-1956, 68 y.o.   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    Standing more with PT-  time has increased a little-  when stands at sink 3-4 minutes at a time.    Wearing PRAFO's at night.   PSA is going up- 5+ up to 14+- scheduling another PET scan- 12/11/23-  Looking to change to another medicine- but will have to- will also do chemo possibly before put on immunotherapy.    Baclofen  10 mg at bedtime-  some nights asks for another one-  spasming at night 1 hour after goes to bed/takes meds.   Thinks he's getting stronger- NP told him that.   Regular PT sees him at least 1x/month-  when went to stand by sink, pulled self up by self completely  Max need a little assist.   Feels good when stands.  Since bipedal   Hasn't been doing a lot of audiobooks- since exercising and napping more,  Keeping up on baseball and hockey starting soon.  On Bday 12/01/23  Needs transport to get here still= pain n butt- was late today.  IN loaner w/c- because something went out on w/c.  Needs lateral supports on thighs.    Had yeast infection-  Wife is continually draining- due to allergies'  Doing zinc cream and anti monkey butt powder.   Bit of pressure ulcer-  on R side of crack- stage II- is healing.  Using pool needle to move him side to side.     Pain Inventory Average Pain 1 Pain Right Now 2 My pain is dull  In the last 24 hours, has pain interfered with the following? General activity 0 Relation with others 0 Enjoyment of life 0 What TIME of day is your pain at its worst? daytime Sleep (in general) Good  Pain is worse with: sitting Pain improves with: rest Relief  from Meds: 9  Family History  Problem Relation Age of Onset   Hypertension Mother    Stroke Mother    Gout Father    Heart attack Father 77   Heart disease Sister    Lung cancer Sister    Drug abuse Sister    Breast cancer Paternal Aunt    Lung cancer Maternal Grandmother    Colon cancer Neg Hx    Esophageal cancer Neg Hx    Rectal cancer Neg Hx    Stomach cancer Neg Hx    Social History   Socioeconomic History   Marital status: Married    Spouse name: [name]    Number of children: 0   Years of education: Not on file   Highest education level: Bachelor's degree (e.g., BA, AB, BS)  Occupational History   Occupation: Warden/ranger: [employer]  Tobacco Use   Smoking status: Never    Passive exposure: Never   Smokeless tobacco: Never  Vaping Use   Vaping status: Never Used  Substance and Sexual Activity   Alcohol use: Not Currently   Drug use: No   Sexual activity: Not Currently  Other Topics Concern   Not on file  Social History Narrative   Lives at home with  spouse.    Social Drivers of Corporate investment banker Strain: Low Risk  (08/18/2023)   Overall Financial Resource Strain (CARDIA)    Difficulty of Paying Living Expenses: Not hard at all  Food Insecurity: No Food Insecurity (08/18/2023)   Hunger Vital Sign    Worried About Running Out of Food in the Last Year: Never true    Ran Out of Food in the Last Year: Never true  Transportation Needs: No Transportation Needs (08/18/2023)   PRAPARE - Administrator, Civil Service (Medical): No    Lack of Transportation (Non-Medical): No  Physical Activity: Inactive (08/18/2023)   Exercise Vital Sign    Days of Exercise per Week: 0 days    Minutes of Exercise per Session: Not on file  Stress: No Stress Concern Present (08/18/2023)   [employer] of Occupational Health - Occupational Stress Questionnaire    Feeling of Stress: Only a little  Social Connections: Moderately Isolated  (08/18/2023)   Social Connection and Isolation Panel    Frequency of Communication with Friends and Family: More than three times a week    Frequency of Social Gatherings with Friends and Family: More than three times a week    Attends Religious Services: Never    Database administrator or Organizations: No    Attends Engineer, structural: Not on file    Marital Status: Married   Past Surgical History:  Procedure Laterality Date   CARDIAC CATHETERIZATION  03/2000   HERNIA REPAIR  1961   umbilical   IR CATHETER TUBE CHANGE  12/12/2022   IR US  GUIDE BX ASP/DRAIN  11/11/2022   LEFT HEART CATH AND CORONARY ANGIOGRAPHY Left 03/20/2020   Procedure: LEFT HEART CATH AND CORONARY ANGIOGRAPHY;  Surgeon: Mady Bruckner, MD;  Location: [facility];  Service: Cardiovascular;  Laterality: Left;   POSTERIOR CERVICAL FUSION/FORAMINOTOMY N/A 10/06/2021   Procedure: POSTERIOR CERVICAL FUSION/ FORAMINOTOMY LEVEL 3;  Surgeon: Clois Fret, MD;  Location: [facility];  Service: Neurosurgery;  Laterality: N/A;  will need monitoring   RADIOLOGY WITH ANESTHESIA Left 04/18/2021   Procedure: MRI SHOULDER WITHOUT CONTRAST WITH ANESTHESIA;  Surgeon: Radiologist, Medication, MD;  Location: [facility];  Service: Radiology;  Laterality: Left;   RADIOLOGY WITH ANESTHESIA N/A 10/03/2021   Procedure: MRI CERVICAL SPINE WITH ANESTHESIA;  Surgeon: Radiologist, Medication, MD;  Location: [facility];  Service: Radiology;  Laterality: N/A;   RADIOLOGY WITH ANESTHESIA N/A 01/30/2022   Procedure: MRI CERVICAL SPINE WITH AND WITOHOUT CONTRAST WITH ANESTHESIA;  Surgeon: Radiologist, Medication, MD;  Location: [facility];  Service: Radiology;  Laterality: N/A;   RADIOLOGY WITH ANESTHESIA N/A 05/22/2022   Procedure: MRI LUMBER SPINE AND CERVIC SPINE WITH AND WITHOUT CONTRAST;  Surgeon: Radiologist, Medication, MD;  Location: [facility];  Service: Radiology;  Laterality: N/A;   RADIOLOGY WITH ANESTHESIA N/A 12/25/2022   Procedure:  MRI WITH ANESTHESIA OF CERVICAL SPINE WITH AND WITHOUT CONTRAST,CARDIAC MORPHOLOGY;  Surgeon: Radiologist, Medication, MD;  Location: [facility];  Service: Radiology;  Laterality: N/A;   REVERSE SHOULDER ARTHROPLASTY Left    ROTATOR CUFF REPAIR  11/2010   Dr Cleotilde   SUPRAPUBIC CATHETER INSERTION  11/11/2022   TONSILLECTOMY     TOTAL HIP ARTHROPLASTY Right 02/24/2009   TOTAL HIP ARTHROPLASTY Left 06/2016   Dr Leora   Past Surgical History:  Procedure Laterality Date   CARDIAC CATHETERIZATION  03/2000   HERNIA REPAIR  1961   umbilical   IR CATHETER TUBE CHANGE  12/12/2022   IR US  GUIDE BX ASP/DRAIN  11/11/2022   LEFT HEART CATH AND CORONARY ANGIOGRAPHY Left 03/20/2020   Procedure: LEFT HEART CATH AND CORONARY ANGIOGRAPHY;  Surgeon: Mady Bruckner, MD;  Location: [facility];  Service: Cardiovascular;  Laterality: Left;   POSTERIOR CERVICAL FUSION/FORAMINOTOMY N/A 10/06/2021   Procedure: POSTERIOR CERVICAL FUSION/ FORAMINOTOMY LEVEL 3;  Surgeon: Clois Fret, MD;  Location: [facility];  Service: Neurosurgery;  Laterality: N/A;  will need monitoring   RADIOLOGY WITH ANESTHESIA Left 04/18/2021   Procedure: MRI SHOULDER WITHOUT CONTRAST WITH ANESTHESIA;  Surgeon: Radiologist, Medication, MD;  [facility];  Service: Radiology;  Laterality: Left;   RADIOLOGY WITH ANESTHESIA N/A 10/03/2021   Procedure: MRI CERVICAL SPINE WITH ANESTHESIA;  Surgeon: Radiologist, Medication, MD;  [facility];  Service: Radiology;  Laterality: N/A;   RADIOLOGY WITH ANESTHESIA N/A 01/30/2022   Procedure: MRI CERVICAL SPINE WITH AND WITOHOUT CONTRAST WITH ANESTHESIA;  Surgeon: Radiologist, Medication, MD;  [facility];  Service: Radiology;  Laterality: N/A;   RADIOLOGY WITH ANESTHESIA N/A 05/22/2022   Procedure: MRI LUMBER SPINE AND CERVIC SPINE WITH AND WITHOUT CONTRAST;  Surgeon: Radiologist, Medication, MD;  [facility];  Service: Radiology;  Laterality: N/A;   RADIOLOGY WITH ANESTHESIA  N/A 12/25/2022   Procedure: MRI WITH ANESTHESIA OF CERVICAL SPINE WITH AND WITHOUT CONTRAST,CARDIAC MORPHOLOGY;  Surgeon: Radiologist, Medication, MD;  Location: [facility];  Service: Radiology;  Laterality: N/A;   REVERSE SHOULDER ARTHROPLASTY Left    ROTATOR CUFF REPAIR  11/2010   Dr Cleotilde   SUPRAPUBIC CATHETER INSERTION  11/11/2022   TONSILLECTOMY     TOTAL HIP ARTHROPLASTY Right 02/24/2009   TOTAL HIP ARTHROPLASTY Left 06/2016   Dr Leora   Past Medical History:  Diagnosis Date   Allergy    Arthritis    osteoarthritis   Cancer [facility])    prostate   CHF (congestive heart failure) (HCC)    GERD (gastroesophageal reflux disease)    History of kidney stones    passed   Hypertension    Hypothyroidism    Palpitations 2022   Paralysis (HCC)    paraplegia 09/2021-surgery or stroke   Pneumonia    12/22/28- > 25 years ago   Stroke [facility])    Mini stroke - Aug 2023   Thyroid  disease    Hyperthyroidism s/p radioactive iodine ablation   There were no vitals taken for this visit.  Opioid Risk Score:   Fall Risk Score:  `1  Depression screen PHQ 2/9     11/20/2023    2:03 PM 01/16/2023    1:09 PM 09/19/2022   12:55 PM 12/02/2021    1:20 PM 10/15/2020    3:41 PM 10/10/2019    3:10 PM 10/05/2018    7:54 AM  Depression screen PHQ 2/9  Decreased Interest 0 0 0 0 0 0 0  Down, Depressed, Hopeless 0 0 0 1 0 0 0  PHQ - 2 Score 0 0 0 1 0 0 0  Altered sleeping    0     Tired, decreased energy    1     Change in appetite    0     Feeling bad or failure about yourself     0     Trouble concentrating    0     Moving slowly or fidgety/restless    0     Suicidal thoughts    0     PHQ-9 Score  2       Review of Systems  Skin:  Positive for wound.       Pressure sore  All other systems reviewed and are negative.      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   Strength is mildly increased and Spasticity is mildly improved today.   2. Getting PET scan for increased PSA on 12/11/23.   3. Mood is up and down- still- esp with PET scan need.   4. Con't Baclofen  10 mg 2x/day as needed- #60- with 5 refills.   5. Gets Klonopin  from PCP-  which is fine- could help spasticity.   6. Con't Home Health and nurse  7. Con't Thigh high TEDs and PRAFOs at night  8. F/U in 3 months double apt- SCI  9. My goals are to focus on transfers first.    I spent a total of 28   minutes on total care today- >50% coordination of care- due to d/w pt about goals short term goals of transfers - not just walking- and d/w pt about PET scan and H/H.

## 2023-11-27 ENCOUNTER — Ambulatory Visit: Attending: Medical | Admitting: Medical

## 2023-11-27 ENCOUNTER — Encounter: Payer: Self-pay | Admitting: Medical

## 2023-11-27 VITALS — BP 120/80 | HR 61 | Ht 69.0 in | Wt 145.0 lb

## 2023-11-27 DIAGNOSIS — Q2112 Patent foramen ovale: Secondary | ICD-10-CM | POA: Diagnosis present

## 2023-11-27 DIAGNOSIS — I428 Other cardiomyopathies: Secondary | ICD-10-CM | POA: Insufficient documentation

## 2023-11-27 DIAGNOSIS — I493 Ventricular premature depolarization: Secondary | ICD-10-CM | POA: Insufficient documentation

## 2023-11-27 DIAGNOSIS — I77819 Aortic ectasia, unspecified site: Secondary | ICD-10-CM | POA: Insufficient documentation

## 2023-11-27 DIAGNOSIS — I10 Essential (primary) hypertension: Secondary | ICD-10-CM | POA: Diagnosis present

## 2023-11-27 DIAGNOSIS — I5022 Chronic systolic (congestive) heart failure: Secondary | ICD-10-CM | POA: Insufficient documentation

## 2023-11-27 NOTE — Progress Notes (Signed)
 Cardiology Office Note   Date:  11/27/2023  ID:  Jason [name], DOB 1955-09-12, MRN [id number] PCP: [facility] Providers Cardiologist:  Lonni Hanson, MD Electrophysiologist:  OLE ONEIDA HOLTS, MD   History of Present Illness Jason [name] is a 68 y.o. male  with a hx of metastatic prostate cancer, h/o stroke, HTN, thyroid  disease, GERD, HFrEF, NICM, osteoarthritis, and PVCs who presents for 3 month follow-up.   The patient underwent remote stress test in 2002 which led to cardiac cath that showed no significant CAD.    The patient was evaluated for palpitations in 2022. He underwent cardiac cath 02/2020 that showed no significant CAD, normal LV function with mildly elevated filling pressures consistent with diastolic dysfunction. He was started on metorpolol for PVCs.    Seen in the office 03/30/20 reporting palpitations. A heart monitor was ordered that showed SR, rare PVC/PACs, NSVT, PSVT. He was placed on Toprol . Echo 06/2020 showed LVEF 50-55%, no WMA, mild LVH, G1DD, RVSF, normal RV size and function.   Patient was seen in June 2024 reporting low heart rate into the 30s and 40s.  EKG showed normal sinus rhythm, heart rate 60 to 70s with frequent PVCs.  A 2-week heart monitor was ordered and labs were drawn.  Heart monitor showed predominantly normal sinus rhythm with an average heart rate of 50, rare PACs and frequent PVCs 15.8% burden, 6 episodes of nonsustained VT, 11 runs of SVT, lasting up to 14 seconds. Echo showed LVEF 25-30%, G2DD, mild to mod MR. The patient was referred to EP, who recommended cMRI. Lisinopril  was stopped and he was started on Entresto . cMRI showed LVEF 43%, high risk scar, PFO noted, and mild aortic dilation.   The patient was last seen 08/21/23 and was stable. Spiro was previously stopped for hyponatremia. He was re-trialed on SGLT2i. Echo showed LVEF 40-45%.   Today, the patient is overall doing well. He denies chest pain,  SOB, palpitations, no lower leg edema.  He is eating and drinking OK. No recent fever, chills, nausea or vomiting.    Studies Reviewed EKG Interpretation Date/Time:  Friday November 27 2023 14:36:02 EDT Ventricular Rate:  61 PR Interval:  160 QRS Duration:  86 QT Interval:  492 QTC Calculation: 495 R Axis:   1  Text Interpretation: Sinus bradycardia with occasional Premature ventricular complexes Prolonged QT When compared with ECG of 21-Aug-2023 09:52, Premature ventricular complexes are now Present Questionable change in QRS axis Nonspecific T wave abnormality now evident in Inferior leads Confirmed by Franchester, Essa Malachi ([id number]) on 11/27/2023 2:39:50 PM    Echo 08/2023 1. Left ventricular ejection fraction, by estimation, is 40 to 45%. The  left ventricle has mild to moderately decreased function. The left  ventricle demonstrates global hypokinesis. Left ventricular diastolic  parameters are indeterminate. The average  left ventricular global longitudinal strain is -13.3 %. The global  longitudinal strain is abnormal.   2. Right ventricular systolic function is normal. The right ventricular  size is normal.   3. The mitral valve is normal in structure. Trivial mitral valve  regurgitation.   4. The aortic valve was not well visualized. Aortic valve regurgitation  is mild.   5. The inferior vena cava is normal in size with greater than 50%  respiratory variability, suggesting right atrial pressure of 3 mmHg.    cMRI 11/2022 IMPRESSION: 1.  Mild decrease in LVEF, 43%.   2. Mild increase in ECV signal no evidence of LGE suggestive  of high risk scar.   3.  PFO noted.   4. Mild aortic dilation, 40 mm. Consider repeat cardiac imaging in one year.   Stanly Leavens MD     Electronically Signed   By: Stanly Leavens M.D.   On: 12/25/2022 13:39   Echo limited 09/2022 1. Left ventricular ejection fraction, by estimation, is 25 to 30%. The  left ventricle has severely  decreased function. The left ventricle  demonstrates global hypokinesis. The left ventricular internal cavity size  was moderately dilated. Left  ventricular diastolic parameters are consistent with Grade II diastolic  dysfunction (pseudonormalization). The average left ventricular global  longitudinal strain is -10.8 %. The global longitudinal strain is  abnormal.   2. Right ventricular systolic function is normal. The right ventricular  size is normal.   3. The mitral valve is normal in structure. Mild to moderate mitral valve  regurgitation. No evidence of mitral stenosis.   4. The aortic valve is normal in structure. Aortic valve regurgitation is  mild. No aortic stenosis is present.   5. The inferior vena cava is normal in size with greater than 50%  respiratory variability, suggesting right atrial pressure of 3 mmHg.    Heart monitor 08/2022     The patient was monitored for 14 days; only 9 days, 20 hours were suitable for analysis.   The predominant rhythm was sinus with an average rate of 50 bpm in sinus (range 39-81 bpm).   Rare PAC's and frequent PVC's were observed (PVC burden 15.8%).   6 episodes of nonsustained ventricular tachycardia occurred, lasting up to 7 beats with a maximum rate of 139 bpm.   11 supraventricular runs were noted, lasting up to 14.0 seconds with a maximum rate of 126 bpm.   No sustained arrhythmia or prolonged pause was seen.   There were no patient triggered events.   Predominantly sinus rhythm with frequent PVC's (15.8% burden) and several episodes of NSVT and PSVT, as detailed above.   LHC 2022 Conclusions: No angiographically significant coronary artery disease. Normal left ventricular contraction with mildly elevated filling pressures consistent with diastolic dysfunction.   Recommendations: Primary prevention of coronary artery disease. Continue blood pressure control as well as recently added metoprolol  for management of incidentally noted  PVCs.  If symptoms persist, ambulatory cardiac monitoring +/- echcardiography will need to be considered.   Lonni Hanson, MD [facility]     Physical Exam VS:  BP 120/80 (BP Location: Left Arm, Patient Position: Sitting, Cuff Size: Normal)   Pulse 61   Ht 5' 9 (1.753 m)   Wt 145 lb (65.8 kg)   SpO2 99%   BMI 21.41 kg/m        Wt Readings from Last 3 Encounters:  11/27/23 145 lb (65.8 kg)  11/20/23 155 lb (70.3 kg)  09/03/23 155 lb (70.3 kg)    GEN: Well nourished, well developed in no acute distress NECK: No JVD; No carotid bruits CARDIAC: RRR, no murmurs, rubs, gallops RESPIRATORY:  Clear to auscultation without rales, wheezing or rhonchi  ABDOMEN: Soft, non-tender, non-distended EXTREMITIES:  No edema; No deformity   ASSESSMENT AND PLAN  Chronic HFrEF ICM 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  Patient remains asymptomatic although he is wheelchair-bound due to paraplegia.The patient is euvolemic on exam. No SGLT2i due to UTI. No spiro due to hyponatremia.  Repeat echo showed EF 40 to 45%, which was similar to cardiac MRI.  Continue Toprol  25mg  daily  and Entresto  24-26mg BID.   PVCs EKG shows NSR with occasional PVCs. He is asymptomatic. Continue Toprol  25mg  daily.   Dilated thoracic aorta cMRI showed 40 mm dilation.  Repeat imaging annually.  Avoid fluoroquinolone antibiotics if possible.  PFO Noted incidentally on cMRI.  Continue aspirin  81 mg daily  HTN Blood pressure is good today, continue current medications.        Dispo: Follow-up in 4 months  Signed, Gurnoor Ursua VEAR Fishman, PA-C

## 2023-11-27 NOTE — Patient Instructions (Signed)
 Medication Instructions:  Your physician recommends that you continue on your current medications as directed. Please refer to the Current Medication list given to you today.   *If you need a refill on your cardiac medications before your next appointment, please call your pharmacy*  Lab Work: No labs ordered today    Testing/Procedures: No test ordered today   Follow-Up: At [facility], you and your health needs are our priority.  As part of our continuing mission to provide you with exceptional heart care, our providers are all part of one team.  This team includes your primary Cardiologist (physician) and Advanced Practice Providers or APPs (Physician Assistants and Nurse Practitioners) who all work together to provide you with the care you need, when you need it.  Your next appointment:   4 month(s)  Provider:   Mikey Fishman, PA-C

## 2023-12-02 ENCOUNTER — Encounter: Payer: Self-pay | Admitting: Oncology

## 2023-12-02 NOTE — Progress Notes (Signed)
 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 Currently on chemoprevention therapy with Eligard .  Status post palliative radiation to spine. PMSA PET scan showed partial response. Previously on Xtandi  160mg  daily, switched to Darolutamide  600mg  BID due to cardiology issues/drug interactions.  Labs are reviewed and discussed with patient.  Continue Darolutamide  600mg  BID  PSA further increased to 14, possible disease progression.  Recommend to repeat PSMA PET scan   History of deep vein thrombosis (DVT) of lower extremity Provoked, continue Aspirin  162 mg daily for prophylaxis.  Androgen deprivation therapy On Eligard  45mg  Q6 months. Today, and next dose due Dec 2025  Macrocytic anemia Slightly decreased hemoglobin, macrocytosis.  Recommend empiric B12 supplementation 1000mcg daily Check B12 and Folate   Orders Placed This Encounter  Procedures   CBC with Differential (Cancer Center Only)    Standing Status:   Future    Expected Date:   02/12/2024    Expiration Date:   05/12/2024   CMP (Cancer Center only)    Standing Status:   Future    Expected Date:   02/12/2024    Expiration Date:   05/12/2024   PSA    Standing Status:   Future    Expected Date:   02/12/2024    Expiration Date:   05/12/2024   Follow up 3 months.   All questions were answered. The patient knows to call the clinic with any problems, questions or concerns.  Call Babara, MD, PhD [facility] Hematology Oncology 11/13/2023         HISTORY OF PRESENTING ILLNESS:  Jason [name] 68 y.o. male presents to establish care for metastatic prostate cancer I have reviewed his chart and materials related to his cancer extensively and collaborated history with the patient. 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  Associated epidural disease greater at the C6 where there is marked canal stenosis without cord compression.  Left greater than right foraminal effacement due to the extraosseous disease.   10/03/2021 Imaging   MRI left shoulder showed High-riding humeral head with massive full-thickness tear of the entire supraspinatus tendon and the anterior 50% of the infraspinatus tendon. Additional partial-thickness articular sided tearing of the posterior infraspinatus. Moderate to high-grade supraspinatus and infraspinatus muscle atrophy suggests these tears  are chronic. Partial-thickness tearing of the superior greater than inferior  aspects of the subscapularis tendon footprint. Mild subscapularis muscle atrophy.  Moderate degenerative changes of the acromioclavicular joint. Moderate glenohumeral cartilage degenerative changes.   10/05/2021 Imaging   CT chest abdomen pelvis with contrast showed  Moderate severity retroperitoneal and pelvic lymphadenopathy,consistent with metastatic disease. Small lytic areas at the levels of L4 and S1, with diffusely sclerotic changes involving the second and fourth right ribs, sacrum and left iliac bone. 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  Sigmoid  diverticulosis. Aortic atherosclerosis   10/05/2021 Imaging   CT cervical spine without contrast Extensive heterogeneous sclerotic lesions throughout the cervical spine compatible with metastatic disease to the bone.  Mixed sclerotic and lytic changes in the posterior elements at C5 and C6.Extraosseous tumor at C5 and C6 is better appreciated on the MRI scan.   10/05/2021 Tumor Marker   PSA >1500   10/06/2021 Cancer Staging   Staging form: Prostate, AJCC 8th Edition - Clinical stage from 10/06/2021: Stage IVB (cN1, pM1c, PSA: 1500) - Signed by Babara Call, MD on 12/04/2021 Stage prefix: Initial diagnosis Prostate specific antigen (PSA) range: 20 or greater   10/08/2021 Imaging   MRI thoracic spine and the lumbar spine with and without contrast 1. 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 2. 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 3. Additional epidural tumor along the posterior endplates at T9,T11, and T12 without high-grade spinal canal stenosis or cord compression. 4. Extensive epidural tumor in the lumbar spine is greatest in thickness at L1 without high-grade spinal canal stenosis at this level; however, superimposed on pre-existing degenerative changes lower in the lumbar spine results in severe spinal canal stenosis with cauda equina nerve root compression at L3-L4 and L4-L5. Multilevel neural foraminal stenosis is detailed above. 5. Postsurgical changes reflecting C6 laminectomy unchanged spinal canal stenosis compared to the postoperative study from 1 day prior.No definite cord signal abnormality. 6. Extramedullary fluid collection along the dorsal aspect of the cord beginning at T9-T10 extending into the lumbar spine is likely subdural in location but is of uncertain etiology; evolving  blood products not excluded. The collection anteriorly displaces the cord and cauda equina nerve roots without frank cord compression or signal abnormality. There is no peripheral enhancement to suggest abscess. 7. Prevertebral edema in the cervical spine which may be postsurgical in nature. 8. New bilateral lower lobe consolidations may reflect atelectasis or developing pneumonia/aspiration.   10/08/2021 Imaging   MRI brain with and without contrast showed 1. 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 There is intracranial extension of tumor along the right frontal convexity measuring up to 5 mm in thickness without mass effect on the underlying brain parenchyma or midline shift. Extracranial extension of tumor in this location measures up to approximately 3 mm. 2. Additional extracranial extension of tumor into the scalp along the vertex measuring up to 7 mm in thickness. 3. Small focus of diffusion restriction in the right parietal cortex most in keeping with a small acute infarct. 4. No evidence of parenchymal metastatic disease.   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 10/06/2021, patient underwent C6 cervical laminectomy and resection of tumor.  Pathology came back positive for metastatic prostate cancer. 8/14 developed significant bilateral upper and lower extremity weakness with hypotension requiring transfer to the ICU, pressors, steroid, respiratory failure on mechanical ventilation. Later patient was stabilized and extubated.  8/16 Firmagon  loading dose 240mg   9/15 Eligard  22.5mg  given inpatient during his rehab admission.     10/2021 -  Radiation Therapy   Palliative radiation to spine.    12/04/2021 Tumor Marker   PSA 67.09   01/13/2022 Tumor Marker  PSA 43.95   01/23/2022 -  Chemotherapy   Started on Xtandi  160mg   daily.     INTERVAL HISTORY Jason [name] is a 68 y.o. male who has above history reviewed by me today presents for follow up visit for metastatic prostate cancer. Patient was accompanied by wife.  Patient has chronic bilateral lower extremity extremity weakness, generalized left upper extremity weakness.  [name]  He follows up with neurology.He continues physical therapy and has made some progress.  + fatigued, brain fog, fatigue, unchanged.    MEDICAL HISTORY:  Past Medical History:  Diagnosis Date   Allergy    Arthritis    osteoarthritis   Cancer (HCC)    prostate   CHF (congestive heart failure) (HCC)    GERD (gastroesophageal reflux disease)    History of kidney stones    passed   Hypertension    Hypothyroidism    Palpitations 2022   Paralysis (HCC)    paraplegia 09/2021-surgery or stroke   Pneumonia    12/22/28- > 25 years ago   Stroke [facility])    Mini stroke - Aug 2023   Thyroid  disease    Hyperthyroidism s/p radioactive iodine ablation    SURGICAL HISTORY: Past Surgical History:  Procedure Laterality Date   CARDIAC CATHETERIZATION  03/2000   HERNIA REPAIR  1961   umbilical   IR CATHETER TUBE CHANGE  12/12/2022   IR US  GUIDE BX ASP/DRAIN  11/11/2022   LEFT HEART CATH AND CORONARY ANGIOGRAPHY Left 03/20/2020   Procedure: LEFT HEART CATH AND CORONARY ANGIOGRAPHY;  Surgeon: Mady Bruckner, MD;  Location: [facility] INVASIVE CV LAB;  Service: Cardiovascular;  Laterality: Left;   POSTERIOR CERVICAL FUSION/FORAMINOTOMY N/A 10/06/2021   Procedure: POSTERIOR CERVICAL FUSION/ FORAMINOTOMY LEVEL 3;  Surgeon: Clois Fret, MD;  Location: [facility] ORS;  Service: Neurosurgery;  Laterality: N/A;  will need monitoring   RADIOLOGY WITH ANESTHESIA Left 04/18/2021   Procedure: MRI SHOULDER WITHOUT CONTRAST WITH ANESTHESIA;  Surgeon: Radiologist, Medication, MD;  Location: [facility] OR;  Service: Radiology;  Laterality: Left;   RADIOLOGY WITH ANESTHESIA N/A 10/03/2021   Procedure: MRI CERVICAL  SPINE WITH ANESTHESIA;  Surgeon: Radiologist, Medication, MD;  Location: [facility] OR;  Service: Radiology;  Laterality: N/A;   RADIOLOGY WITH ANESTHESIA N/A 01/30/2022   Procedure: MRI CERVICAL SPINE WITH AND WITOHOUT CONTRAST WITH ANESTHESIA;  Surgeon: Radiologist, Medication, MD;  Location: [facility] OR;  Service: Radiology;  Laterality: N/A;   RADIOLOGY WITH ANESTHESIA N/A 05/22/2022   Procedure: MRI LUMBER SPINE AND CERVIC SPINE WITH AND WITHOUT CONTRAST;  Surgeon: Radiologist, Medication, MD;  Location: [facility] OR;  Service: Radiology;  Laterality: N/A;   RADIOLOGY WITH ANESTHESIA N/A 12/25/2022   Procedure: MRI WITH ANESTHESIA OF CERVICAL SPINE WITH AND WITHOUT CONTRAST,CARDIAC MORPHOLOGY;  Surgeon: Radiologist, Medication, MD;  Location: [facility] OR;  Service: Radiology;  Laterality: N/A;   REVERSE SHOULDER ARTHROPLASTY Left    ROTATOR CUFF REPAIR  11/2010   Dr Cleotilde   SUPRAPUBIC CATHETER INSERTION  11/11/2022   TONSILLECTOMY     TOTAL HIP ARTHROPLASTY Right 02/24/2009   TOTAL HIP ARTHROPLASTY Left 06/2016   Dr Leora    SOCIAL HISTORY: Social History   Socioeconomic History   Marital status: Married    Spouse name: [name]    Number of children: 0   Years of education: Not on file   Highest education level: Bachelor's degree (e.g., BA, AB, BS)  Occupational History   Occupation: Warden/ranger: [employer]  Tobacco Use   Smoking  status: Never    Passive exposure: Never   Smokeless tobacco: Never  Vaping Use   Vaping status: Never Used  Substance and Sexual Activity   Alcohol use: Not Currently   Drug use: No   Sexual activity: Not Currently  Other Topics Concern   Not on file  Social History Narrative   Lives at home with spouse.    Social Drivers of Corporate investment banker Strain: Low Risk  (08/18/2023)   Overall Financial Resource Strain (CARDIA)    Difficulty of Paying Living Expenses: Not hard at all  Food Insecurity: No Food Insecurity (08/18/2023)   Hunger Vital  Sign    Worried About Running Out of Food in the Last Year: Never true    Ran Out of Food in the Last Year: Never true  Transportation Needs: No Transportation Needs (08/18/2023)   PRAPARE - Administrator, Civil Service (Medical): No    Lack of Transportation (Non-Medical): No  Physical Activity: Inactive (08/18/2023)   Exercise Vital Sign    Days of Exercise per Week: 0 days    Minutes of Exercise per Session: Not on file  Stress: No Stress Concern Present (08/18/2023)   [employer] of Occupational Health - Occupational Stress Questionnaire    Feeling of Stress: Only a little  Social Connections: Moderately Isolated (08/18/2023)   Social Connection and Isolation Panel    Frequency of Communication with Friends and Family: More than three times a week    Frequency of Social Gatherings with Friends and Family: More than three times a week    Attends Religious Services: Never    Database administrator or Organizations: No    Attends Engineer, structural: Not on file    Marital Status: Married  Catering manager Violence: Not on file    FAMILY HISTORY: Family History  Problem Relation Age of Onset   Hypertension Mother    Stroke Mother    Gout Father    Heart attack Father 73   Heart disease Sister    Lung cancer Sister    Drug abuse Sister    Breast cancer Paternal Aunt    Lung cancer Maternal Grandmother    Colon cancer Neg Hx    Esophageal cancer Neg Hx    Rectal cancer Neg Hx    Stomach cancer Neg Hx     ALLERGIES:  has no known allergies.  MEDICATIONS:  Current Outpatient Medications  Medication Sig Dispense Refill   acetaminophen  (TYLENOL ) 500 MG tablet Take 1,000 mg by mouth 2 (two) times daily.     ascorbic acid  (VITAMIN C ) 500 MG tablet Take 1 tablet (500 mg total) by mouth daily. 30 tablet 0   aspirin  EC 81 MG tablet Take 1 tablet (81 mg total) by mouth daily. Swallow whole. 30 tablet 12   atorvastatin  (LIPITOR) 40 MG tablet Take 1  tablet (40 mg total) by mouth daily. 90 tablet 3   cetirizine (ZYRTEC) 10 MG tablet Take 10 mg by mouth at bedtime.     clonazePAM  (KLONOPIN ) 0.5 MG tablet Take 1 tablet (0.5 mg total) by mouth 2 (two) times daily. 60 tablet 0   CRANBERRY PO Take 1 tablet by mouth 2 (two) times daily.     darolutamide  (NUBEQA ) 300 MG tablet Take 2 tablets (600 mg total) by mouth 2 (two) times daily with a meal. 120 tablet 3   famotidine  (PEPCID ) 10 MG tablet Take 10 mg by mouth as needed. (Patient  taking differently: Take 10 mg by mouth at bedtime.)     FLUoxetine  (PROZAC ) 20 MG capsule Take 1 capsule (20 mg total) by mouth at bedtime. 90 capsule 3   fluticasone  (FLONASE ) 50 MCG/ACT nasal spray Place 1 spray into both nostrils daily. 48 g 3   guaiFENesin  (MUCINEX ) 600 MG 12 hr tablet Take 600 mg by mouth 2 (two) times daily.     levothyroxine  (SYNTHROID ) 88 MCG tablet Take 1 tablet (88 mcg total) by mouth daily. 90 tablet 3   metoprolol  succinate (TOPROL -XL) 25 MG 24 hr tablet Take 1 tablet (25 mg total) by mouth at bedtime. Take with or immediately following a meal. 90 tablet 3   Multiple Vitamin (MULTIVITAMIN) tablet Take 1 tablet by mouth daily.     potassium chloride  SA (KLOR-CON  M) 20 MEQ tablet Take 1 tablet (20 mEq total) by mouth at bedtime. 90 tablet 3   sacubitril-valsartan (ENTRESTO ) 24-26 MG Take 1 tablet by mouth 2 (two) times daily. 180 tablet 3   sennosides-docusate sodium  (SENOKOT-S) 8.6-50 MG tablet Take 2 tablets by mouth daily. 60 tablet 11   baclofen  (LIORESAL ) 10 MG tablet Take 0.5-1 tablets (5-10 mg total) by mouth 2 (two) times daily as needed for muscle spasms. 60 each 5   No current facility-administered medications for this visit.    Review of Systems  Constitutional:  Positive for fatigue. Negative for appetite change and chills.  HENT:   Negative for hearing loss.   Eyes:  Negative for icterus.  Respiratory:  Negative for cough and shortness of breath.   Cardiovascular:  Negative  for chest pain.  Gastrointestinal:  Negative for abdominal pain and blood in stool.  Genitourinary:         + Foley catheter  Musculoskeletal:  Negative for back pain.  Skin:  Negative for rash.  Neurological:  Positive for extremity weakness.     PHYSICAL EXAMINATION: ECOG PERFORMANCE STATUS: 3 - Symptomatic, >50% confined to bed  Vitals:   11/13/23 1204  BP: 112/67  Pulse: (!) 56  Resp: 18  Temp: 97.8 F (36.6 C)   There were no vitals filed for this visit.      Physical Exam Constitutional:      General: He is not in acute distress.    Appearance: He is not diaphoretic.     Comments: Patient sits in wheelchair.  HENT:     Head: Normocephalic and atraumatic.     Nose: Nose normal.     Mouth/Throat:     Pharynx: No oropharyngeal exudate.  Eyes:     General: No scleral icterus. Cardiovascular:     Rate and Rhythm: Normal rate.  Pulmonary:     Effort: Pulmonary effort is normal. No respiratory distress.  Abdominal:     General: There is no distension.  Musculoskeletal:        General: Normal range of motion.     Cervical back: Normal range of motion.  Skin:    General: Skin is warm and dry.     Findings: No erythema.  Neurological:     Mental Status: He is alert and oriented to person, place, and time.     Cranial Nerves: No cranial nerve deficit.     Motor: No abnormal muscle tone.     Comments: Quadriplegia  Psychiatric:        Mood and Affect: Mood and affect normal.      LABORATORY DATA:  I have reviewed the data as listed  Latest Ref Rng & Units 11/13/2023   11:40 AM 08/07/2023   10:37 AM 05/08/2023   10:46 AM  CBC  WBC 4.0 - 10.5 K/uL 7.1  6.8  4.2   Hemoglobin 13.0 - 17.0 g/dL 88.0  88.0  87.5   Hematocrit 39.0 - 52.0 % 35.9  35.5  37.7   Platelets 150 - 400 K/uL 221  197  193       Latest Ref Rng & Units 11/13/2023   11:41 AM 08/14/2023    2:18 PM 08/07/2023   10:37 AM  CMP  Glucose 70 - 99 mg/dL 86  90  93   BUN 8 - 23 mg/dL 11  10   8    Creatinine 0.61 - 1.24 mg/dL 9.51  9.49  9.55   Sodium 135 - 145 mmol/L 133  136  132   Potassium 3.5 - 5.1 mmol/L 4.0  3.9  3.9   Chloride 98 - 111 mmol/L 100  97  98   CO2 22 - 32 mmol/L 28  23  27    Calcium  8.9 - 10.3 mg/dL 8.5  8.8  8.3   Total Protein 6.5 - 8.1 g/dL 6.2   6.2   Total Bilirubin 0.0 - 1.2 mg/dL 1.1   1.2   Alkaline Phos 38 - 126 U/L 102   102   AST 15 - 41 U/L 32   31   ALT 0 - 44 U/L 35   42      RADIOGRAPHIC STUDIES: I have personally reviewed the radiological images as listed and agreed with the findings in the report. No results found.

## 2023-12-02 NOTE — Assessment & Plan Note (Signed)
 Slightly decreased hemoglobin, macrocytosis.  Recommend empiric B12 supplementation 1000mcg daily Check B12 and Folate

## 2023-12-02 NOTE — Assessment & Plan Note (Signed)
 On Eligard  45mg  Q6 months. Today, and next dose due Dec 2025

## 2023-12-04 ENCOUNTER — Ambulatory Visit: Admitting: Physician Assistant

## 2023-12-04 VITALS — BP 145/77 | HR 84

## 2023-12-04 DIAGNOSIS — Z435 Encounter for attention to cystostomy: Secondary | ICD-10-CM

## 2023-12-04 DIAGNOSIS — R339 Retention of urine, unspecified: Secondary | ICD-10-CM | POA: Diagnosis not present

## 2023-12-04 NOTE — Progress Notes (Signed)
 Suprapubic Cath Change  Patient is present today for a suprapubic catheter change due to urinary retention.  8ml of water was drained from the balloon, a 16FR Silastic foley cath was removed from the tract without difficulty.  Site was cleaned and prepped in a sterile fashion with betadine.  A 16FR Silastic foley cath was replaced into the tract no complications were noted. Urine return was noted, 10 ml of sterile water was inflated into the balloon and a night bag was attached for drainage.  Patient tolerated well.   Performed by: Hogan Hoobler, PA-C   Follow up: Return in about 4 weeks (around 01/01/2024) for SPT exchange.

## 2023-12-11 ENCOUNTER — Ambulatory Visit
Admission: RE | Admit: 2023-12-11 | Discharge: 2023-12-11 | Disposition: A | Source: Ambulatory Visit | Attending: Oncology

## 2023-12-11 DIAGNOSIS — C7940 Secondary malignant neoplasm of unspecified part of nervous system: Secondary | ICD-10-CM | POA: Insufficient documentation

## 2023-12-11 DIAGNOSIS — C7951 Secondary malignant neoplasm of bone: Secondary | ICD-10-CM | POA: Diagnosis not present

## 2023-12-11 DIAGNOSIS — C61 Malignant neoplasm of prostate: Secondary | ICD-10-CM | POA: Insufficient documentation

## 2023-12-11 DIAGNOSIS — R972 Elevated prostate specific antigen [PSA]: Secondary | ICD-10-CM | POA: Insufficient documentation

## 2023-12-11 MED ORDER — FLOTUFOLASTAT F 18 GALLIUM 296-5846 MBQ/ML IV SOLN
8.7700 | Freq: Once | INTRAVENOUS | Status: AC
  Administered 2023-12-11: 8.77 via INTRAVENOUS
  Filled 2023-12-11: qty 9

## 2023-12-17 ENCOUNTER — Encounter: Payer: Self-pay | Admitting: Physical Medicine and Rehabilitation

## 2023-12-21 ENCOUNTER — Encounter: Payer: Self-pay | Admitting: Oncology

## 2023-12-21 ENCOUNTER — Telehealth: Payer: Self-pay

## 2023-12-21 ENCOUNTER — Inpatient Hospital Stay: Attending: Anatomic Pathology & Clinical Pathology | Admitting: Oncology

## 2023-12-21 ENCOUNTER — Other Ambulatory Visit: Payer: Self-pay | Admitting: Oncology

## 2023-12-21 VITALS — BP 134/75 | HR 51 | Temp 98.4°F | Wt 155.0 lb

## 2023-12-21 DIAGNOSIS — D7589 Other specified diseases of blood and blood-forming organs: Secondary | ICD-10-CM | POA: Diagnosis not present

## 2023-12-21 DIAGNOSIS — M48061 Spinal stenosis, lumbar region without neurogenic claudication: Secondary | ICD-10-CM | POA: Diagnosis not present

## 2023-12-21 DIAGNOSIS — M75122 Complete rotator cuff tear or rupture of left shoulder, not specified as traumatic: Secondary | ICD-10-CM | POA: Insufficient documentation

## 2023-12-21 DIAGNOSIS — G8929 Other chronic pain: Secondary | ICD-10-CM | POA: Insufficient documentation

## 2023-12-21 DIAGNOSIS — Z79899 Other long term (current) drug therapy: Secondary | ICD-10-CM | POA: Diagnosis not present

## 2023-12-21 DIAGNOSIS — I110 Hypertensive heart disease with heart failure: Secondary | ICD-10-CM | POA: Diagnosis not present

## 2023-12-21 DIAGNOSIS — I700 Atherosclerosis of aorta: Secondary | ICD-10-CM | POA: Insufficient documentation

## 2023-12-21 DIAGNOSIS — G8220 Paraplegia, unspecified: Secondary | ICD-10-CM | POA: Diagnosis not present

## 2023-12-21 DIAGNOSIS — D539 Nutritional anemia, unspecified: Secondary | ICD-10-CM | POA: Diagnosis not present

## 2023-12-21 DIAGNOSIS — Z814 Family history of other substance abuse and dependence: Secondary | ICD-10-CM | POA: Insufficient documentation

## 2023-12-21 DIAGNOSIS — Z79890 Hormone replacement therapy (postmenopausal): Secondary | ICD-10-CM | POA: Diagnosis not present

## 2023-12-21 DIAGNOSIS — C61 Malignant neoplasm of prostate: Secondary | ICD-10-CM

## 2023-12-21 DIAGNOSIS — S46919A Strain of unspecified muscle, fascia and tendon at shoulder and upper arm level, unspecified arm, initial encounter: Secondary | ICD-10-CM | POA: Insufficient documentation

## 2023-12-21 DIAGNOSIS — M75110 Incomplete rotator cuff tear or rupture of unspecified shoulder, not specified as traumatic: Secondary | ICD-10-CM | POA: Diagnosis not present

## 2023-12-21 DIAGNOSIS — Z801 Family history of malignant neoplasm of trachea, bronchus and lung: Secondary | ICD-10-CM | POA: Insufficient documentation

## 2023-12-21 DIAGNOSIS — I509 Heart failure, unspecified: Secondary | ICD-10-CM | POA: Insufficient documentation

## 2023-12-21 DIAGNOSIS — M4802 Spinal stenosis, cervical region: Secondary | ICD-10-CM | POA: Diagnosis not present

## 2023-12-21 DIAGNOSIS — M47816 Spondylosis without myelopathy or radiculopathy, lumbar region: Secondary | ICD-10-CM | POA: Diagnosis not present

## 2023-12-21 DIAGNOSIS — Z7982 Long term (current) use of aspirin: Secondary | ICD-10-CM | POA: Diagnosis not present

## 2023-12-21 DIAGNOSIS — Z8701 Personal history of pneumonia (recurrent): Secondary | ICD-10-CM | POA: Insufficient documentation

## 2023-12-21 DIAGNOSIS — C7940 Secondary malignant neoplasm of unspecified part of nervous system: Secondary | ICD-10-CM | POA: Insufficient documentation

## 2023-12-21 DIAGNOSIS — Z8673 Personal history of transient ischemic attack (TIA), and cerebral infarction without residual deficits: Secondary | ICD-10-CM | POA: Insufficient documentation

## 2023-12-21 DIAGNOSIS — Z8249 Family history of ischemic heart disease and other diseases of the circulatory system: Secondary | ICD-10-CM | POA: Insufficient documentation

## 2023-12-21 DIAGNOSIS — E039 Hypothyroidism, unspecified: Secondary | ICD-10-CM | POA: Diagnosis not present

## 2023-12-21 DIAGNOSIS — C7951 Secondary malignant neoplasm of bone: Secondary | ICD-10-CM | POA: Diagnosis not present

## 2023-12-21 DIAGNOSIS — K5730 Diverticulosis of large intestine without perforation or abscess without bleeding: Secondary | ICD-10-CM | POA: Insufficient documentation

## 2023-12-21 DIAGNOSIS — Z86718 Personal history of other venous thrombosis and embolism: Secondary | ICD-10-CM | POA: Diagnosis not present

## 2023-12-21 DIAGNOSIS — Z823 Family history of stroke: Secondary | ICD-10-CM | POA: Insufficient documentation

## 2023-12-21 DIAGNOSIS — Z9089 Acquired absence of other organs: Secondary | ICD-10-CM | POA: Insufficient documentation

## 2023-12-21 DIAGNOSIS — Z87442 Personal history of urinary calculi: Secondary | ICD-10-CM | POA: Insufficient documentation

## 2023-12-21 DIAGNOSIS — M19019 Primary osteoarthritis, unspecified shoulder: Secondary | ICD-10-CM | POA: Diagnosis not present

## 2023-12-21 DIAGNOSIS — Z803 Family history of malignant neoplasm of breast: Secondary | ICD-10-CM | POA: Insufficient documentation

## 2023-12-21 DIAGNOSIS — Z723 Lack of physical exercise: Secondary | ICD-10-CM | POA: Diagnosis not present

## 2023-12-21 NOTE — Progress Notes (Signed)
 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 Currently on chemoprevention therapy with Eligard .  Status post palliative radiation to spine. PMSA PET scan showed partial response. Previously on Xtandi  160mg  daily, switched to Darolutamide  600mg  BID due to cardiology issues/drug interactions.  Labs are reviewed and discussed with patient.  Continue Darolutamide  600mg  BID  PSA is progressively increasing.  Repeat PSMA showed increased hyper metabolic activities and previous bone lesions.  No new bone lesions. He is not a good candidate for docetaxel chemotherapy treatments. Refer patient to radiology-for evaluation of Pluvicto   History of deep vein thrombosis (DVT) of lower extremity Provoked, continue Aspirin  162 mg daily for prophylaxis.  Macrocytic anemia Slightly decreased hemoglobin, macrocytosis.  Adequate B12 and folate levels.  Monitor.     Orders Placed This Encounter  Procedures   NM Radiologist Eval And Mgmt    Standing Status:   Future    Expected Date:   12/28/2023    Expiration Date:   12/20/2024    Reason for Exam (SYMPTOM  OR DIAGNOSIS REQUIRED):   prostate cancer, eval for pluvicto tx    If indicated for the ordered procedure, I authorize the administration of a radiopharmaceutical per Radiology protocol:   Yes    Preferred imaging location?:   [facility]   Follow up in December 2025  All questions were answered. The patient knows to call the clinic with any problems,  questions or concerns.  Call Babara, MD, PhD [facility] Hematology Oncology 12/21/2023        HISTORY OF PRESENTING ILLNESS:  Jason [name] 68 y.o. male presents to establish care for metastatic prostate cancer I have reviewed his chart and materials related to his cancer extensively and collaborated history with the patient. 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  Associated epidural disease greater at the C6 where there is marked canal stenosis without cord compression.  Left greater than right foraminal effacement due to the extraosseous disease.   10/03/2021 Imaging   MRI left shoulder showed High-riding humeral head with massive full-thickness tear of the entire supraspinatus tendon and the anterior 50% of the infraspinatus tendon. Additional partial-thickness articular sided tearing of the posterior infraspinatus. Moderate to high-grade supraspinatus and infraspinatus muscle atrophy suggests these tears  are chronic. Partial-thickness tearing of the superior greater than inferior  aspects of the subscapularis tendon footprint. Mild subscapularis muscle atrophy.  Moderate degenerative changes of the acromioclavicular joint. Moderate glenohumeral cartilage degenerative changes.   10/05/2021 Imaging   CT chest abdomen pelvis with contrast showed  Moderate severity retroperitoneal and pelvic lymphadenopathy,consistent with metastatic disease. Small lytic areas at the levels of L4 and S1, with diffusely sclerotic changes involving the second and fourth right ribs, sacrum and left iliac bone. 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  Sigmoid diverticulosis. Aortic atherosclerosis   10/05/2021 Imaging   CT cervical spine without contrast Extensive heterogeneous sclerotic lesions throughout the cervical spine compatible with metastatic disease to the bone.  Mixed sclerotic and lytic changes in the posterior elements at C5 and C6.Extraosseous tumor at C5 and C6 is better appreciated on the MRI scan.   10/05/2021 Tumor Marker   PSA >1500   10/06/2021 Cancer Staging   Staging form: Prostate, AJCC 8th Edition - Clinical stage from 10/06/2021: Stage IVB (cN1, pM1c, PSA: 1500) - Signed by Babara Call, MD on 12/04/2021 Stage prefix: Initial diagnosis Prostate specific antigen (PSA) range: 20 or greater   10/08/2021 Imaging   MRI thoracic spine and the lumbar spine with and without contrast 1. 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 2. 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 3. Additional epidural tumor along the posterior endplates at T9,T11, and T12 without high-grade spinal canal stenosis or cord compression. 4. Extensive epidural tumor in the lumbar spine is greatest in thickness at L1 without high-grade spinal canal stenosis at this level; however, superimposed on pre-existing degenerative changes lower in the lumbar spine results in severe spinal canal stenosis with cauda equina nerve root compression at L3-L4 and L4-L5. Multilevel neural foraminal stenosis is detailed above. 5. Postsurgical changes reflecting C6 laminectomy unchanged spinal canal stenosis compared to the postoperative study from 1 day prior.No definite cord signal abnormality. 6. Extramedullary fluid collection along the dorsal aspect of the cord beginning at T9-T10 extending into the  lumbar spine is likely subdural in location but is of uncertain etiology; evolving blood products not excluded. The collection anteriorly displaces the cord and cauda equina nerve roots without frank cord compression or signal abnormality. There is no peripheral enhancement to suggest abscess. 7. Prevertebral edema in the cervical spine which may be postsurgical in nature. 8. New bilateral lower lobe consolidations may reflect atelectasis or developing pneumonia/aspiration.   10/08/2021 Imaging   MRI brain with and without contrast showed 1. 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 There is intracranial extension of tumor along the right frontal convexity measuring up to 5 mm in thickness without mass effect on the underlying brain parenchyma or midline shift. Extracranial extension of tumor in this location measures up to approximately 3 mm. 2. Additional extracranial extension of tumor into the scalp along the vertex measuring up to 7 mm in thickness. 3. Small focus of diffusion restriction in the right parietal cortex most in keeping with a small acute infarct. 4. No evidence of parenchymal metastatic disease.   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 10/06/2021, patient underwent C6 cervical laminectomy and resection of tumor.  Pathology came back positive for metastatic prostate cancer. 8/14 developed significant bilateral upper and lower extremity weakness with hypotension requiring transfer to the ICU, pressors, steroid, respiratory failure on mechanical ventilation. Later patient was stabilized and extubated.  8/16 Firmagon  loading dose 240mg   9/15 Eligard  22.5mg  given inpatient during his rehab admission.     10/2021 -  Radiation Therapy   Palliative radiation to spine.    12/04/2021 Tumor Marker   PSA 67.09   01/13/2022  Tumor Marker  PSA 43.95   01/23/2022 -  Chemotherapy   Started on Xtandi  160mg  daily.     INTERVAL HISTORY Jason [name] is a 68 y.o. male who has above history reviewed by me today presents for follow up visit for metastatic prostate cancer. Patient was accompanied by wife.  Patient has chronic bilateral lower extremity extremity weakness, generalized left upper extremity weakness.  [name]  He follows up with neurology.He continues physical therapy and has made some progress.  + fatigued, brain fog, fatigue, unchanged.  Patient has chronic left shoulder pain, he does not feel pain is much worse compared to his baseline.   MEDICAL HISTORY:  Past Medical History:  Diagnosis Date   Allergy    Arthritis    osteoarthritis   Cancer (HCC)    prostate   CHF (congestive heart failure) (HCC)    GERD (gastroesophageal reflux disease)    History of kidney stones    passed   Hypertension    Hypothyroidism    Palpitations 2022   Paralysis (HCC)    paraplegia 09/2021-surgery or stroke   Pneumonia    12/22/28- > 25 years ago   Stroke [facility])    Mini stroke - Aug 2023   Thyroid  disease    Hyperthyroidism s/p radioactive iodine ablation    SURGICAL HISTORY: Past Surgical History:  Procedure Laterality Date   CARDIAC CATHETERIZATION  03/2000   HERNIA REPAIR  1961   umbilical   IR CATHETER TUBE CHANGE  12/12/2022   IR US  GUIDE BX ASP/DRAIN  11/11/2022   LEFT HEART CATH AND CORONARY ANGIOGRAPHY Left 03/20/2020   Procedure: LEFT HEART CATH AND CORONARY ANGIOGRAPHY;  Surgeon: Mady Bruckner, MD;  Location: [facility] INVASIVE CV LAB;  Service: Cardiovascular;  Laterality: Left;   POSTERIOR CERVICAL FUSION/FORAMINOTOMY N/A 10/06/2021   Procedure: POSTERIOR CERVICAL FUSION/ FORAMINOTOMY LEVEL 3;  Surgeon: Clois Fret, MD;  Location: [facility] ORS;  Service: Neurosurgery;  Laterality: N/A;  will need monitoring   RADIOLOGY WITH ANESTHESIA Left 04/18/2021   Procedure: MRI SHOULDER WITHOUT  CONTRAST WITH ANESTHESIA;  Surgeon: Radiologist, Medication, MD;  Location: [facility] OR;  Service: Radiology;  Laterality: Left;   RADIOLOGY WITH ANESTHESIA N/A 10/03/2021   Procedure: MRI CERVICAL SPINE WITH ANESTHESIA;  Surgeon: Radiologist, Medication, MD;  Location: [facility] OR;  Service: Radiology;  Laterality: N/A;   RADIOLOGY WITH ANESTHESIA N/A 01/30/2022   Procedure: MRI CERVICAL SPINE WITH AND WITOHOUT CONTRAST WITH ANESTHESIA;  Surgeon: Radiologist, Medication, MD;  Location: [facility] OR;  Service: Radiology;  Laterality: N/A;   RADIOLOGY WITH ANESTHESIA N/A 05/22/2022   Procedure: MRI LUMBER SPINE AND CERVIC SPINE WITH AND WITHOUT CONTRAST;  Surgeon: Radiologist, Medication, MD;  Location: [facility] OR;  Service: Radiology;  Laterality: N/A;   RADIOLOGY WITH ANESTHESIA N/A 12/25/2022   Procedure: MRI WITH ANESTHESIA OF CERVICAL SPINE WITH AND WITHOUT CONTRAST,CARDIAC MORPHOLOGY;  Surgeon: Radiologist, Medication, MD;  Location: [facility] OR;  Service: Radiology;  Laterality: N/A;   REVERSE SHOULDER ARTHROPLASTY Left    ROTATOR CUFF REPAIR  11/2010   Dr Cleotilde   SUPRAPUBIC CATHETER INSERTION  11/11/2022   TONSILLECTOMY     TOTAL HIP ARTHROPLASTY Right 02/24/2009   TOTAL HIP ARTHROPLASTY Left 06/2016   Dr Leora    SOCIAL HISTORY: Social History   Socioeconomic History   Marital status: Married    Spouse name: [name]    Number of children: 0   Years of education: Not on file   Highest education level: Bachelor's degree (e.g., BA, AB, BS)  Occupational  History   Occupation: Warden/ranger: [employer]  Tobacco Use   Smoking status: Never    Passive exposure: Never   Smokeless tobacco: Never  Vaping Use   Vaping status: Never Used  Substance and Sexual Activity   Alcohol use: Not Currently   Drug use: No   Sexual activity: Not Currently  Other Topics Concern   Not on file  Social History Narrative   Lives at home with spouse.    Social Drivers of Corporate Investment Banker  Strain: Low Risk  (08/18/2023)   Overall Financial Resource Strain (CARDIA)    Difficulty of Paying Living Expenses: Not hard at all  Food Insecurity: No Food Insecurity (08/18/2023)   Hunger Vital Sign    Worried About Running Out of Food in the Last Year: Never true    Ran Out of Food in the Last Year: Never true  Transportation Needs: No Transportation Needs (08/18/2023)   PRAPARE - Administrator, Civil Service (Medical): No    Lack of Transportation (Non-Medical): No  Physical Activity: Inactive (08/18/2023)   Exercise Vital Sign    Days of Exercise per Week: 0 days    Minutes of Exercise per Session: Not on file  Stress: No Stress Concern Present (08/18/2023)   [employer] of Occupational Health - Occupational Stress Questionnaire    Feeling of Stress: Only a little  Social Connections: Moderately Isolated (08/18/2023)   Social Connection and Isolation Panel    Frequency of Communication with Friends and Family: More than three times a week    Frequency of Social Gatherings with Friends and Family: More than three times a week    Attends Religious Services: Never    Database Administrator or Organizations: No    Attends Engineer, Structural: Not on file    Marital Status: Married  Catering Manager Violence: Not on file    FAMILY HISTORY: Family History  Problem Relation Age of Onset   Hypertension Mother    Stroke Mother    Gout Father    Heart attack Father 51   Heart disease Sister    Lung cancer Sister    Drug abuse Sister    Breast cancer Paternal Aunt    Lung cancer Maternal Grandmother    Colon cancer Neg Hx    Esophageal cancer Neg Hx    Rectal cancer Neg Hx    Stomach cancer Neg Hx     ALLERGIES:  has no known allergies.  MEDICATIONS:  Current Outpatient Medications  Medication Sig Dispense Refill   acetaminophen  (TYLENOL ) 500 MG tablet Take 1,000 mg by mouth 2 (two) times daily.     ascorbic acid  (VITAMIN C ) 500 MG tablet Take 1  tablet (500 mg total) by mouth daily. 30 tablet 0   aspirin  EC 81 MG tablet Take 1 tablet (81 mg total) by mouth daily. Swallow whole. 30 tablet 12   atorvastatin  (LIPITOR) 40 MG tablet Take 1 tablet (40 mg total) by mouth daily. 90 tablet 3   baclofen  (LIORESAL ) 10 MG tablet Take 0.5-1 tablets (5-10 mg total) by mouth 2 (two) times daily as needed for muscle spasms. 60 each 5   cetirizine (ZYRTEC) 10 MG tablet Take 10 mg by mouth at bedtime.     clonazePAM  (KLONOPIN ) 0.5 MG tablet Take 1 tablet (0.5 mg total) by mouth 2 (two) times daily. 60 tablet 0   CRANBERRY PO Take 1 tablet by mouth 2 (two)  times daily.     darolutamide  (NUBEQA ) 300 MG tablet Take 2 tablets (600 mg total) by mouth 2 (two) times daily with a meal. 120 tablet 3   famotidine  (PEPCID ) 10 MG tablet Take 10 mg by mouth as needed. (Patient taking differently: Take 10 mg by mouth at bedtime.)     FLUoxetine  (PROZAC ) 20 MG capsule Take 1 capsule (20 mg total) by mouth at bedtime. 90 capsule 3   fluticasone  (FLONASE ) 50 MCG/ACT nasal spray Place 1 spray into both nostrils daily. 48 g 3   guaiFENesin  (MUCINEX ) 600 MG 12 hr tablet Take 600 mg by mouth 2 (two) times daily.     levothyroxine  (SYNTHROID ) 88 MCG tablet Take 1 tablet (88 mcg total) by mouth daily. 90 tablet 3   metoprolol  succinate (TOPROL -XL) 25 MG 24 hr tablet Take 1 tablet (25 mg total) by mouth at bedtime. Take with or immediately following a meal. 90 tablet 3   Multiple Vitamin (MULTIVITAMIN) tablet Take 1 tablet by mouth daily.     potassium chloride  SA (KLOR-CON  M) 20 MEQ tablet Take 1 tablet (20 mEq total) by mouth at bedtime. 90 tablet 3   sacubitril-valsartan (ENTRESTO ) 24-26 MG Take 1 tablet by mouth 2 (two) times daily. 180 tablet 3   sennosides-docusate sodium  (SENOKOT-S) 8.6-50 MG tablet Take 2 tablets by mouth daily. 60 tablet 11   No current facility-administered medications for this visit.    Review of Systems  Constitutional:  Positive for fatigue.  Negative for appetite change and chills.  HENT:   Negative for hearing loss.   Eyes:  Negative for icterus.  Respiratory:  Negative for cough and shortness of breath.   Cardiovascular:  Negative for chest pain.  Gastrointestinal:  Negative for abdominal pain and blood in stool.  Genitourinary:         + Foley catheter  Musculoskeletal:  Negative for back pain.  Skin:  Negative for rash.  Neurological:  Positive for extremity weakness.     PHYSICAL EXAMINATION: ECOG PERFORMANCE STATUS: 3 - Symptomatic, >50% confined to bed  Vitals:   12/21/23 1042  BP: 134/75  Pulse: (!) 51  Temp: 98.4 F (36.9 C)  SpO2: 100%   Filed Weights   12/21/23 1042  Weight: 155 lb (70.3 kg)        Physical Exam Constitutional:      General: He is not in acute distress.    Appearance: He is not diaphoretic.     Comments: Patient sits in wheelchair.  HENT:     Head: Normocephalic and atraumatic.     Nose: Nose normal.     Mouth/Throat:     Pharynx: No oropharyngeal exudate.  Eyes:     General: No scleral icterus. Cardiovascular:     Rate and Rhythm: Normal rate.  Pulmonary:     Effort: Pulmonary effort is normal. No respiratory distress.  Abdominal:     General: There is no distension.  Musculoskeletal:        General: Normal range of motion.     Cervical back: Normal range of motion.  Skin:    General: Skin is warm and dry.     Findings: No erythema.  Neurological:     Mental Status: He is alert and oriented to person, place, and time.     Cranial Nerves: No cranial nerve deficit.     Motor: No abnormal muscle tone.     Comments: Quadriplegia  Psychiatric:        Mood  and Affect: Mood and affect normal.      LABORATORY DATA:  I have reviewed the data as listed    Latest Ref Rng & Units 11/13/2023   11:40 AM 08/07/2023   10:37 AM 05/08/2023   10:46 AM  CBC  WBC 4.0 - 10.5 K/uL 7.1  6.8  4.2   Hemoglobin 13.0 - 17.0 g/dL 88.0  88.0  87.5   Hematocrit 39.0 - 52.0 % 35.9   35.5  37.7   Platelets 150 - 400 K/uL 221  197  193       Latest Ref Rng & Units 11/13/2023   11:41 AM 08/14/2023    2:18 PM 08/07/2023   10:37 AM  CMP  Glucose 70 - 99 mg/dL 86  90  93   BUN 8 - 23 mg/dL 11  10  8    Creatinine 0.61 - 1.24 mg/dL 9.51  9.49  9.55   Sodium 135 - 145 mmol/L 133  136  132   Potassium 3.5 - 5.1 mmol/L 4.0  3.9  3.9   Chloride 98 - 111 mmol/L 100  97  98   CO2 22 - 32 mmol/L 28  23  27    Calcium  8.9 - 10.3 mg/dL 8.5  8.8  8.3   Total Protein 6.5 - 8.1 g/dL 6.2   6.2   Total Bilirubin 0.0 - 1.2 mg/dL 1.1   1.2   Alkaline Phos 38 - 126 U/L 102   102   AST 15 - 41 U/L 32   31   ALT 0 - 44 U/L 35   42      RADIOGRAPHIC STUDIES: I have personally reviewed the radiological images as listed and agreed with the findings in the report. NM PET (PSMA) SKULL TO MID THIGH Result Date: 12/14/2023 EXAM: PROSTATE PET SKULL BASE TO MID THIGHS 12/11/2023 02:16:42 PM TECHNIQUE: RADIOPHARMACEUTICAL:  mCi PSMA-targeting radiopharmaceutical injected intravenously. PET imaging was obtained from skull vertex to mid thighs. Computed tomography was used for attenuation correction and localization. Fusion imaging was obtained. Uptake time  mins. COMPARISON: PSMA PET scan 08/17/2023. CLINICAL HISTORY: Elevated PSA, prostate cancer. 8.38mCi F-18 FDG RAC IV inj @1251 ; NM PET IMAGE PSMA; PROSTATE CANCER METASTATIC TO CENTRAL NERVOUS SYSTEM; PSA 11/13/23 14.47. 08/07/23 5.35; CURRENTLY TAKING DAROLUTAMIDE  ; 08/17/23 NUCLEAR MEDICINE PET SKULL BASE TO THIGH. FINDINGS: PROSTATE AND PROSTATE BED: No recurrence in the prostatectomy bed. LYMPH NODES: No PSMA avid pelvic lymph nodes or retroperitoneal lymph nodes. BONES: Again demonstrated multifocal PSMA avid skeletal metastasis. Several of the lesions are increased in intensity compared to prior. For example, lesion in the left iliac wing with SUVmax equal 127 compared to SUVmax equal 86 on image 124. Lesion at T10 with SUVmax equal 99 compared to  SUVmax equal 27 on image 67. Several rib lesions have expanded in activity, for example, posterior left 4th rib with more bone involvement on image 47; activity is similar. Lesion in the left scapula with SUVmax equal 102 compared to SUVmax equal 39. There are no clear new lesions identified other than expansile rib lesions. Overall pattern of skeletal metastasis is similar to prior with increase in radiotracer intensity of several lesions and several lesions slightly greater bone involvement. No clear new sites of skeletal metastases. OTHER PET FINDINGS: Physiologic activity within the salivary glands, spleen, kidneys, bowel, and urinary bladder. No suspicious pulmonary nodules. No liver metastasis. IMPRESSION: 1. Multifocal PSMA-avid skeletal metastases with increased radiotracer intensity in multiple lesions compared to prior, without definite  new metastatic sites. 2. No evidence of recurrence in the prostatectomy bed. 3. No PSMA-avid pelvic or retroperitoneal lymph nodes. Electronically signed by: Norleen Boxer MD 12/14/2023 02:41 PM EDT RP Workstation: [id number]

## 2023-12-21 NOTE — Assessment & Plan Note (Signed)
 Slightly decreased hemoglobin, macrocytosis.  Adequate B12 and folate levels.  Monitor.

## 2023-12-21 NOTE — Assessment & Plan Note (Signed)
 Provoked, continue Aspirin 162 mg daily for prophylaxis.

## 2023-12-21 NOTE — Telephone Encounter (Signed)
 Referral for Pluvicto eval and mgmt have been placed in [facility].

## 2023-12-21 NOTE — Assessment & Plan Note (Signed)
 Metastatic castration sensitive prostate cancer, with CNS involvement. Currently on chemoprevention therapy with Eligard .  Status post palliative radiation to spine. PMSA PET scan showed partial response. Previously on Xtandi  160mg  daily, switched to Darolutamide  600mg  BID due to cardiology issues/drug interactions.  Labs are reviewed and discussed with patient.  Continue Darolutamide  600mg  BID  PSA is progressively increasing.  Repeat PSMA showed increased hyper metabolic activities and previous bone lesions.  No new bone lesions. He is not a good candidate for docetaxel chemotherapy treatments. Refer patient to radiology-for evaluation of Pluvicto

## 2023-12-21 NOTE — Telephone Encounter (Signed)
 Pt scheduled on 11/4

## 2023-12-22 ENCOUNTER — Other Ambulatory Visit: Payer: Self-pay | Admitting: Oncology

## 2023-12-22 DIAGNOSIS — C61 Malignant neoplasm of prostate: Secondary | ICD-10-CM

## 2023-12-22 NOTE — Telephone Encounter (Signed)
 Has apt with Nuclear medicine To eval for Cluvecto- a new medicine for bone mets- q6 weeks for 6 treatments or Chemo at [facility].   To keep things under control.   Thursday has MRI under anesthesia- to get blood work.

## 2023-12-24 ENCOUNTER — Telehealth: Payer: Self-pay | Admitting: *Deleted

## 2023-12-24 NOTE — Telephone Encounter (Signed)
 H&P form for outpatient adult sedation procedures filled out & signed by Dr Buckley.  Form & latest office  notes from Dr Babara & Dr Buckley faxed to [facility], fax #[phone].  Fax confirmation received.

## 2023-12-29 ENCOUNTER — Encounter: Payer: Self-pay | Admitting: Internal Medicine

## 2023-12-29 ENCOUNTER — Encounter (HOSPITAL_COMMUNITY)
Admission: RE | Admit: 2023-12-29 | Discharge: 2023-12-29 | Disposition: A | Discharge status: Home / Self Care | Source: Ambulatory Visit | Attending: Oncology | Admitting: Oncology

## 2023-12-29 DIAGNOSIS — C61 Malignant neoplasm of prostate: Secondary | ICD-10-CM | POA: Insufficient documentation

## 2023-12-29 DIAGNOSIS — C7940 Secondary malignant neoplasm of unspecified part of nervous system: Secondary | ICD-10-CM | POA: Insufficient documentation

## 2023-12-29 NOTE — Consult Note (Signed)
 Chief Complaint: Patient with metastatic castrate resistant prostate cancer. Evaluation for   Lu 177 PSMA therapy (Pluvicto).  Referring Physician(s):Yu      Patient Status: [facility] - Out-pt  History of Present Illness: Jason [name] is a 68 y.o. with metastatic prostate carcinoma.  Carcinoma identified at time of surgery for shoulder arthropathy.  Widespread skeletal metastasis on presentation with PSA greater than 1,500.  Patient have significant involvement the upper cervical spine with subsequent decompression for spinal cord syndrome.     Subsequently patient suffered neurologic deficit and now has limited use of arms and legs and is wheelchair and care dependent.  Patient  has a suprapubic catheter.     Patient currently on Eligard .  Patient on androgen antagonist Nubeqa      Most recently PSMA is increased from 5.4 to 14.5 in September 2025.     PSMA PET scan 12/11/2023 demonstrate progressive skeletal metastasis within the axillary and appendicular skeleton.   Past Medical History:  Diagnosis Date   Allergy    Arthritis    osteoarthritis   Cancer (HCC)    prostate   CHF (congestive heart failure) (HCC)    GERD (gastroesophageal reflux disease)    History of kidney stones    passed   Hypertension    Hypothyroidism    Palpitations 2022   Paralysis (HCC)    paraplegia 09/2021-surgery or stroke   Pneumonia    12/22/28- > 25 years ago   Stroke [facility])    Mini stroke - Aug 2023   Thyroid  disease    Hyperthyroidism s/p radioactive iodine ablation    Past Surgical History:  Procedure Laterality Date   CARDIAC CATHETERIZATION  03/2000   HERNIA REPAIR  1961   umbilical   IR CATHETER TUBE CHANGE  12/12/2022   IR US  GUIDE BX ASP/DRAIN  11/11/2022   LEFT HEART CATH AND CORONARY ANGIOGRAPHY Left 03/20/2020   Procedure: LEFT HEART CATH AND CORONARY ANGIOGRAPHY;  Surgeon: Mady Bruckner, MD;  Location: [facility] INVASIVE CV LAB;  Service: Cardiovascular;   Laterality: Left;   POSTERIOR CERVICAL FUSION/FORAMINOTOMY N/A 10/06/2021   Procedure: POSTERIOR CERVICAL FUSION/ FORAMINOTOMY LEVEL 3;  Surgeon: Clois Fret, MD;  Location: [facility] ORS;  Service: Neurosurgery;  Laterality: N/A;  will need monitoring   RADIOLOGY WITH ANESTHESIA Left 04/18/2021   Procedure: MRI SHOULDER WITHOUT CONTRAST WITH ANESTHESIA;  Surgeon: Radiologist, Medication, MD;  Location: [facility] OR;  Service: Radiology;  Laterality: Left;   RADIOLOGY WITH ANESTHESIA N/A 10/03/2021   Procedure: MRI CERVICAL SPINE WITH ANESTHESIA;  Surgeon: Radiologist, Medication, MD;  Location: [facility] OR;  Service: Radiology;  Laterality: N/A;   RADIOLOGY WITH ANESTHESIA N/A 01/30/2022   Procedure: MRI CERVICAL SPINE WITH AND WITOHOUT CONTRAST WITH ANESTHESIA;  Surgeon: Radiologist, Medication, MD;  Location: [facility] OR;  Service: Radiology;  Laterality: N/A;   RADIOLOGY WITH ANESTHESIA N/A 05/22/2022   Procedure: MRI LUMBER SPINE AND CERVIC SPINE WITH AND WITHOUT CONTRAST;  Surgeon: Radiologist, Medication, MD;  Location: [facility] OR;  Service: Radiology;  Laterality: N/A;   RADIOLOGY WITH ANESTHESIA N/A 12/25/2022   Procedure: MRI WITH ANESTHESIA OF CERVICAL SPINE WITH AND WITHOUT CONTRAST,CARDIAC MORPHOLOGY;  Surgeon: Radiologist, Medication, MD;  Location: [facility] OR;  Service: Radiology;  Laterality: N/A;   REVERSE SHOULDER ARTHROPLASTY Left    ROTATOR CUFF REPAIR  11/2010   Dr Cleotilde   SUPRAPUBIC CATHETER INSERTION  11/11/2022   TONSILLECTOMY     TOTAL HIP ARTHROPLASTY Right 02/24/2009   TOTAL HIP ARTHROPLASTY Left 06/2016  Dr Leora    Allergies: Patient has no known allergies.  Medications: Prior to Admission medications   Medication Sig Start Date End Date Taking? Authorizing Provider  acetaminophen  (TYLENOL ) 500 MG tablet Take 1,000 mg by mouth 2 (two) times daily.    [provider]  ascorbic acid  (VITAMIN C ) 500 MG tablet Take 1 tablet (500 mg total) by mouth daily. 11/19/21   Angiulli,  Toribio PARAS, PA-C  aspirin  EC 81 MG tablet Take 1 tablet (81 mg total) by mouth daily. Swallow whole. 10/18/21   Patel, Sona, MD  atorvastatin  (LIPITOR) 40 MG tablet Take 1 tablet (40 mg total) by mouth daily. 01/13/23   Jimmy Charlie FERNS, MD  baclofen  (LIORESAL ) 10 MG tablet Take 0.5-1 tablets (5-10 mg total) by mouth 2 (two) times daily as needed for muscle spasms. 11/20/23   Lovorn, Megan, MD  cetirizine (ZYRTEC) 10 MG tablet Take 10 mg by mouth at bedtime.    [provider]  clonazePAM  (KLONOPIN ) 0.5 MG tablet Take 1 tablet (0.5 mg total) by mouth 2 (two) times daily. 08/19/23   Jimmy Charlie FERNS, MD  CRANBERRY PO Take 1 tablet by mouth 2 (two) times daily.    [provider]  darolutamide  (NUBEQA ) 300 MG tablet Take 2 tablets (600 mg total) by mouth 2 (two) times daily with a meal. 11/03/23   Babara Call, MD  famotidine  (PEPCID ) 10 MG tablet Take 10 mg by mouth as needed. Patient taking differently: Take 10 mg by mouth at bedtime.    [provider]  FLUoxetine  (PROZAC ) 20 MG capsule Take 1 capsule (20 mg total) by mouth at bedtime. 01/13/23   Jimmy Charlie FERNS, MD  fluticasone  (FLONASE ) 50 MCG/ACT nasal spray Place 1 spray into both nostrils daily. 04/11/22   Jimmy Charlie FERNS, MD  guaiFENesin  (MUCINEX ) 600 MG 12 hr tablet Take 600 mg by mouth 2 (two) times daily.    [provider]  levothyroxine  (SYNTHROID ) 88 MCG tablet Take 1 tablet (88 mcg total) by mouth daily. 01/13/23   Letvak, Richard I, MD  metoprolol  succinate (TOPROL -XL) 25 MG 24 hr tablet Take 1 tablet (25 mg total) by mouth at bedtime. Take with or immediately following a meal. 02/06/23 12/21/23  Furth, Cadence H, PA-C  Multiple Vitamin (MULTIVITAMIN) tablet Take 1 tablet by mouth daily.    [provider]  potassium chloride  SA (KLOR-CON  M) 20 MEQ tablet Take 1 tablet (20 mEq total) by mouth at bedtime. 01/13/23   Jimmy Charlie FERNS, MD  sacubitril-valsartan (ENTRESTO ) 24-26 MG Take 1 tablet by  mouth 2 (two) times daily. 08/07/23 08/06/24  End, Lonni, MD  sennosides-docusate sodium  (SENOKOT-S) 8.6-50 MG tablet Take 2 tablets by mouth daily. 02/06/22   Jimmy Charlie FERNS, MD     Family History  Problem Relation Age of Onset   Hypertension Mother    Stroke Mother    Gout Father    Heart attack Father 3   Heart disease Sister    Lung cancer Sister    Drug abuse Sister    Breast cancer Paternal Aunt    Lung cancer Maternal Grandmother    Colon cancer Neg Hx    Esophageal cancer Neg Hx    Rectal cancer Neg Hx    Stomach cancer Neg Hx     Social History   Socioeconomic History   Marital status: Married    Spouse name: [name]    Number of children: 0   Years of education: Not on file  Highest education level: Bachelor's degree (e.g., BA, AB, BS)  Occupational History   Occupation: Warden/ranger: [employer]  Tobacco Use   Smoking status: Never    Passive exposure: Never   Smokeless tobacco: Never  Vaping Use   Vaping status: Never Used  Substance and Sexual Activity   Alcohol use: Not Currently   Drug use: No   Sexual activity: Not Currently  Other Topics Concern   Not on file  Social History Narrative   Lives at home with spouse.    Social Drivers of Corporate Investment Banker Strain: Low Risk  (08/18/2023)   Overall Financial Resource Strain (CARDIA)    Difficulty of Paying Living Expenses: Not hard at all  Food Insecurity: No Food Insecurity (08/18/2023)   Hunger Vital Sign    Worried About Running Out of Food in the Last Year: Never true    Ran Out of Food in the Last Year: Never true  Transportation Needs: No Transportation Needs (08/18/2023)   PRAPARE - Administrator, Civil Service (Medical): No    Lack of Transportation (Non-Medical): No  Physical Activity: Inactive (08/18/2023)   Exercise Vital Sign    Days of Exercise per Week: 0 days    Minutes of Exercise per Session: Not on file  Stress: No Stress Concern  Present (08/18/2023)   [employer] of Occupational Health - Occupational Stress Questionnaire    Feeling of Stress: Only a little  Social Connections: Moderately Isolated (08/18/2023)   Social Connection and Isolation Panel    Frequency of Communication with Friends and Family: More than three times a week    Frequency of Social Gatherings with Friends and Family: More than three times a week    Attends Religious Services: Never    Database Administrator or Organizations: No    Attends Engineer, Structural: Not on file    Marital Status: Married    ECOG Status: 3 - Symptomatic, >50% confined to bed  Review of Systems: A 12 point ROS discussed and pertinent positives are indicated in the HPI above.  All other systems are negative.  Review of Systems  Limited use of arms and legs.  Requires assistance with all ADL.  No bone pain.  Some lethargy  Vital Signs: There were no vitals taken for this visit.  Physical Exam  Wheel chair bound.  Limited use of arms and legs.   Imaging: No results found.   IMPRESSION: 1. Multifocal PSMA-avid skeletal metastases with increased radiotracer intensity in multiple lesions compared to prior, without definite new metastatic sites. 2. No evidence of recurrence in the prostatectomy bed. 3. No PSMA-avid pelvic or retroperitoneal lymph nodes.  Labs: No results for input(s): PSA, PSA1 in the last 8760 hours. PSA = 14.5 CBC: Recent Labs    02/06/23 1043 05/08/23 1046 08/07/23 1037 11/13/23 1140  WBC 4.3 4.2 6.8 7.1  HGB 12.4* 12.4* 11.9* 11.9*  HCT 37.4* 37.7* 35.5* 35.9*  PLT 261 193 197 221    COAGS: No results for input(s): INR, APTT in the last 8760 hours.  BMP: Recent Labs    02/06/23 1043 05/08/23 1046 05/22/23 1417 07/10/23 1420 08/07/23 1037 08/14/23 1418 11/13/23 1141  NA 138 137   < > 130* 132* 136 133*  K 4.4 4.1   < > 4.0 3.9 3.9 4.0  CL 102 101   < > 94* 98 97 100  CO2 28 29   < > 24  27  23 28   GLUCOSE 88 79   < > 78 93 90 86  BUN 9 11   < > 9 8 10 11   CALCIUM  9.0 8.9   < > 8.5* 8.3* 8.8 8.5*  CREATININE 0.34* 0.49*   < > 0.43* 0.44* 0.50* 0.48*  GFRNONAA >60 >60  --   --  >60  --  >60   < > = values in this interval not displayed.    LIVER FUNCTION TESTS: Recent Labs    02/06/23 1043 05/08/23 1046 08/07/23 1037 11/13/23 1141  BILITOT 0.9 1.1 1.2 1.1  AST 23 39 31 32  ALT 22 44 42 35  ALKPHOS 91 85 102 102  PROT 6.2* 6.2* 6.2* 6.2*  ALBUMIN  3.5 3.6 3.4* 3.5    TUMOR MARKERS: PSA = 14.5  Assessment and Plan:  [Patient is adequate candidate Lu 177 PSMA therapy ( vipivotide tetraxetan).  Patient demonstrates moderate progression metastatic  skeletal disease] identified on recent PSMA PET scan.  Additionally patient's PSA is gradually increasing.  Patient has demonstrated progression on androgen deprivation .    Patient explained major and minor risks and benefits of therapy.  Major benefit being progression-free survival.  Major risk being myelosuppression and renal toxicity. Minor toxicity of xerostomia.  All the patient's questions were answered.  Patient accompanied by wife who was also present for consult.    Patient is scheduled for 6 treatments spaced 6 weeks apart.  Recommend following up with oncologist for CBC and CMP 1 week prior to each treatment to assess safety of continuing with therapy.      Thank you for this interesting consult.  I greatly enjoyed meeting RAESEAN BARTOLETTI and look forward to participating in their care.  A copy of this report was sent to the requesting provider on this date.  Electronically Signed: Norleen GORMAN Boxer, MD 12/29/2023, 11:51 AM   I spent a total of  30 Minutes   in face to face in clinical consultation, greater than 50% of which was counseling/coordinating care for metastatic neuroendocrine tumor.

## 2023-12-29 NOTE — Telephone Encounter (Signed)
 There is an outpatient MRI on 11/6, but also an inpatient MRI with anesthesia on 11/11 that does not show in appointment desk, it is under surgeries.

## 2023-12-30 ENCOUNTER — Other Ambulatory Visit: Payer: Self-pay

## 2023-12-30 ENCOUNTER — Encounter (HOSPITAL_COMMUNITY): Payer: Self-pay

## 2023-12-30 NOTE — Telephone Encounter (Signed)
 Spoke with patient's wife this morning.  There is now only 1 MRI scheduled for 12/31/23 with sedation now.  The patient was scheduled for 2 MRIs, one as outpatient and one as admission with sedation.  Explained to Mrs. Campoli that Dr. Buckley is only at [facility] on Fridays and the MRI must have been scheduled by his usual clinical team in [place].

## 2023-12-30 NOTE — Progress Notes (Addendum)
 PCP - [facility]  Cardiologist - End, Christopher, MD (LOV 11-27-23 saw Franchester Mikey DEL, PA-C     PPM/ICD - denies Device Orders - n/a Rep Notified - n/a  Chest x-ray - 03-17-22 EKG - 11-27-23 Stress Test - 20 + years ago per patient ECHO - 09-04-23 Cardiac Cath - 03-20-20  CPAP - denies  DM -denies  Blood Thinner Instructions: denies Aspirin  Instructions:   ERAS Protcol - Npo per patient  COVID TEST- n/a  Anesthesia review: no  Patient verbally denies any shortness of breath, fever, cough and chest pain during phone call   -------------  SDW INSTRUCTIONS given:  Your procedure is scheduled on December 31, 2023.  Report to [facility] Main Entrance A at 5:30 A.M., and check in at the Admitting office.  Call this number if you have problems the morning of surgery:  [phone]   Remember:  Do not eat after midnight the night before your surgery      Take these medicines the morning of surgery with A SIP OF WATER  acetaminophen  (TYLENOL )  atorvastatin  (LIPITOR)  baclofen  (LIORESAL )  clonazePAM  (KLONOPIN )  famotidine  (PEPCID )  guaiFENesin  (MUCINEX )   (FLONASE )  levothyroxine  (SYNTHROID )   As of today, STOP taking any Aspirin  (unless otherwise instructed by your surgeon) Aleve, Naproxen, Ibuprofen, Motrin, Advil, Goody's, BC's, all herbal medications, fish oil, and all vitamins.                      Do not wear jewelry, make up, or nail polish            Do not wear lotions, powders, perfumes/colognes, or deodorant.            Do not shave 48 hours prior to surgery.  Men may shave face and neck.            Do not bring valuables to the hospital.            [facility] is not responsible for any belongings or valuables.  Do NOT Smoke (Tobacco/Vaping) 24 hours prior to your procedure If you use a CPAP at night, you may bring all equipment for your overnight stay.   Contacts, glasses, dentures or bridgework may not be worn into surgery.      For  patients admitted to the hospital, discharge time will be determined by your treatment team.   Patients discharged the day of surgery will not be allowed to drive home, and someone needs to stay with them for 24 hours.    Special instructions:   [place]- Preparing For Surgery  Before surgery, you can play an important role. Because skin is not sterile, your skin needs to be as free of germs as possible. You can reduce the number of germs on your skin by washing with CHG (chlorahexidine gluconate) Soap before surgery.  CHG is an antiseptic cleaner which kills germs and bonds with the skin to continue killing germs even after washing.    Oral Hygiene is also important to reduce your risk of infection.  Remember - BRUSH YOUR TEETH THE MORNING OF SURGERY WITH YOUR REGULAR TOOTHPASTE  Please do not use if you have an allergy to CHG or antibacterial soaps. If your skin becomes reddened/irritated stop using the CHG.  Do not shave (including legs and underarms) for at least 48 hours prior to first CHG shower. It is OK to shave your face.  Please follow these instructions carefully.   Shower the [employer]  BEFORE SURGERY and the MORNING OF SURGERY with DIAL Soap.   Pat yourself dry with a CLEAN TOWEL.  Wear CLEAN PAJAMAS to bed the night before surgery  Place CLEAN SHEETS on your bed the night of your first shower and DO NOT SLEEP WITH PETS.   Day of Surgery: Please shower morning of surgery  Wear Clean/Comfortable clothing the morning of surgery Do not apply any deodorants/lotions.   Remember to brush your teeth WITH YOUR REGULAR TOOTHPASTE.   Questions were answered. Patient verbalized understanding of instructions.

## 2023-12-30 NOTE — Anesthesia Preprocedure Evaluation (Signed)
 Anesthesia Evaluation  Patient identified by MRN, date of birth, ID band Patient awake    Reviewed: Allergy & Precautions, NPO status , Patient's Chart, lab work & pertinent test results  History of Anesthesia Complications Negative for: history of anesthetic complications  Airway Mallampati: III  TM Distance: >3 FB Neck ROM: Full   Comment: Previous grade I view with MAC 4, easy mask Dental   Pulmonary neg pulmonary ROS, neg shortness of breath, neg sleep apnea, neg COPD, neg recent URI   Pulmonary exam normal breath sounds clear to auscultation       Cardiovascular hypertension (metoprolol ), Pt. on home beta blockers (-) angina +CHF (EF 40-45%) and + DVT  (-) Past MI, (-) Cardiac Stents and (-) CABG + dysrhythmias (palpitations, PVCs, prolonged QT) Supra Ventricular Tachycardia + Valvular Problems/Murmurs (mild) AI  Rhythm:Regular Rate:Normal  HLD, PFO  TTE 09/04/2023: IMPRESSIONS    1. Left ventricular ejection fraction, by estimation, is 40 to 45%. The  left ventricle has mild to moderately decreased function. The left  ventricle demonstrates global hypokinesis. Left ventricular diastolic  parameters are indeterminate. The average  left ventricular global longitudinal strain is -13.3 %. The global  longitudinal strain is abnormal.   2. Right ventricular systolic function is normal. The right ventricular  size is normal.   3. The mitral valve is normal in structure. Trivial mitral valve  regurgitation.   4. The aortic valve was not well visualized. Aortic valve regurgitation  is mild.   5. The inferior vena cava is normal in size with greater than 50%  respiratory variability, suggesting right atrial pressure of 3 mmHg.   [facility] 2022 Conclusions: 1. No angiographically significant coronary artery disease. 2. Normal left ventricular contraction with mildly elevated filling pressures consistent with diastolic  dysfunction.     Neuro/Psych neg Seizures PSYCHIATRIC DISORDERS      Wheelchair dependence  Neuromuscular disease (paraplegia, cervical spine stenosis s/p fusion, neurogenic bowel and bladder) CVA (09/2021)    GI/Hepatic Neg liver ROS,GERD  Medicated,,  Endo/Other  neg diabetesHypothyroidism    Renal/GU negative Renal ROS   Prostate cancer    Musculoskeletal  (+) Arthritis , Osteoarthritis,    Abdominal   Peds  Hematology  (+) Blood dyscrasia, anemia Lab Results      Component                Value               Date                      WBC                      7.1                 11/13/2023                HGB                      11.9 (L)            11/13/2023                HCT                      35.9 (L)            11/13/2023  MCV                      102.0 (H)           11/13/2023                PLT                      221                 11/13/2023              Anesthesia Other Findings   Reproductive/Obstetrics                              Anesthesia Physical Anesthesia Plan  ASA: 3  Anesthesia Plan: General   Post-op Pain Management: Minimal or no pain anticipated   Induction: Intravenous  PONV Risk Score and Plan: 2 and Ondansetron  and Treatment may vary due to age or medical condition  Airway Management Planned: Oral ETT  Additional Equipment:   Intra-op Plan:   Post-operative Plan: Extubation in OR  Informed Consent: I have reviewed the patients History and Physical, chart, labs and discussed the procedure including the risks, benefits and alternatives for the proposed anesthesia with the patient or authorized representative who has indicated his/her understanding and acceptance.     Dental advisory given  Plan Discussed with: CRNA and Anesthesiologist  Anesthesia Plan Comments: (Risks of general anesthesia discussed including, but not limited to, sore throat, hoarse voice, chipped/damaged teeth,  injury to vocal cords, nausea and vomiting, allergic reactions, lung infection, heart attack, stroke, and death. All questions answered. )         Anesthesia Quick Evaluation

## 2023-12-31 ENCOUNTER — Encounter (HOSPITAL_COMMUNITY): Payer: Self-pay

## 2023-12-31 ENCOUNTER — Ambulatory Visit (HOSPITAL_COMMUNITY)
Admission: RE | Admit: 2023-12-31 | Discharge: 2023-12-31 | Disposition: A | Discharge status: Home / Self Care | Payer: PRIVATE HEALTH INSURANCE | Source: Ambulatory Visit | Attending: Internal Medicine | Admitting: Internal Medicine

## 2023-12-31 ENCOUNTER — Ambulatory Visit (HOSPITAL_BASED_OUTPATIENT_CLINIC_OR_DEPARTMENT_OTHER): Payer: Self-pay | Admitting: Anesthesiology

## 2023-12-31 ENCOUNTER — Encounter (HOSPITAL_COMMUNITY)
Admission: RE | Disposition: A | Discharge status: Home / Self Care | Payer: Self-pay | Source: Home / Self Care | Attending: Internal Medicine

## 2023-12-31 ENCOUNTER — Ambulatory Visit (HOSPITAL_COMMUNITY)
Admission: RE | Admit: 2023-12-31 | Discharge: 2023-12-31 | Disposition: A | Discharge status: Home / Self Care | Payer: PRIVATE HEALTH INSURANCE | Attending: Internal Medicine | Admitting: Internal Medicine

## 2023-12-31 ENCOUNTER — Ambulatory Visit (HOSPITAL_COMMUNITY): Payer: Self-pay | Admitting: Anesthesiology

## 2023-12-31 DIAGNOSIS — C61 Malignant neoplasm of prostate: Secondary | ICD-10-CM | POA: Insufficient documentation

## 2023-12-31 DIAGNOSIS — E039 Hypothyroidism, unspecified: Secondary | ICD-10-CM | POA: Diagnosis not present

## 2023-12-31 DIAGNOSIS — D539 Nutritional anemia, unspecified: Secondary | ICD-10-CM | POA: Insufficient documentation

## 2023-12-31 DIAGNOSIS — C7940 Secondary malignant neoplasm of unspecified part of nervous system: Secondary | ICD-10-CM | POA: Diagnosis present

## 2023-12-31 DIAGNOSIS — E785 Hyperlipidemia, unspecified: Secondary | ICD-10-CM | POA: Diagnosis not present

## 2023-12-31 DIAGNOSIS — Z7982 Long term (current) use of aspirin: Secondary | ICD-10-CM | POA: Diagnosis not present

## 2023-12-31 DIAGNOSIS — I493 Ventricular premature depolarization: Secondary | ICD-10-CM | POA: Diagnosis not present

## 2023-12-31 DIAGNOSIS — G8250 Quadriplegia, unspecified: Secondary | ICD-10-CM | POA: Insufficient documentation

## 2023-12-31 DIAGNOSIS — I471 Supraventricular tachycardia, unspecified: Secondary | ICD-10-CM | POA: Diagnosis not present

## 2023-12-31 DIAGNOSIS — R9431 Abnormal electrocardiogram [ECG] [EKG]: Secondary | ICD-10-CM | POA: Diagnosis not present

## 2023-12-31 DIAGNOSIS — Z923 Personal history of irradiation: Secondary | ICD-10-CM | POA: Diagnosis not present

## 2023-12-31 DIAGNOSIS — R002 Palpitations: Secondary | ICD-10-CM | POA: Insufficient documentation

## 2023-12-31 DIAGNOSIS — M1990 Unspecified osteoarthritis, unspecified site: Secondary | ICD-10-CM | POA: Insufficient documentation

## 2023-12-31 DIAGNOSIS — K219 Gastro-esophageal reflux disease without esophagitis: Secondary | ICD-10-CM | POA: Insufficient documentation

## 2023-12-31 DIAGNOSIS — C7949 Secondary malignant neoplasm of other parts of nervous system: Secondary | ICD-10-CM

## 2023-12-31 DIAGNOSIS — Z79818 Long term (current) use of other agents affecting estrogen receptors and estrogen levels: Secondary | ICD-10-CM | POA: Insufficient documentation

## 2023-12-31 DIAGNOSIS — K592 Neurogenic bowel, not elsewhere classified: Secondary | ICD-10-CM | POA: Insufficient documentation

## 2023-12-31 DIAGNOSIS — Z993 Dependence on wheelchair: Secondary | ICD-10-CM | POA: Insufficient documentation

## 2023-12-31 DIAGNOSIS — I509 Heart failure, unspecified: Secondary | ICD-10-CM | POA: Diagnosis not present

## 2023-12-31 DIAGNOSIS — Z981 Arthrodesis status: Secondary | ICD-10-CM | POA: Diagnosis not present

## 2023-12-31 DIAGNOSIS — I351 Nonrheumatic aortic (valve) insufficiency: Secondary | ICD-10-CM | POA: Diagnosis not present

## 2023-12-31 DIAGNOSIS — I110 Hypertensive heart disease with heart failure: Secondary | ICD-10-CM

## 2023-12-31 DIAGNOSIS — Q2112 Patent foramen ovale: Secondary | ICD-10-CM | POA: Insufficient documentation

## 2023-12-31 DIAGNOSIS — N319 Neuromuscular dysfunction of bladder, unspecified: Secondary | ICD-10-CM | POA: Insufficient documentation

## 2023-12-31 DIAGNOSIS — I5020 Unspecified systolic (congestive) heart failure: Secondary | ICD-10-CM | POA: Diagnosis not present

## 2023-12-31 DIAGNOSIS — Z8673 Personal history of transient ischemic attack (TIA), and cerebral infarction without residual deficits: Secondary | ICD-10-CM | POA: Insufficient documentation

## 2023-12-31 DIAGNOSIS — Z86718 Personal history of other venous thrombosis and embolism: Secondary | ICD-10-CM | POA: Diagnosis not present

## 2023-12-31 HISTORY — PX: RADIOLOGY WITH ANESTHESIA: SHX6223

## 2023-12-31 SURGERY — MRI WITH ANESTHESIA
Anesthesia: General

## 2023-12-31 MED ORDER — PHENYLEPHRINE HCL-NACL 20-0.9 MG/250ML-% IV SOLN
INTRAVENOUS | Status: DC | PRN
Start: 2023-12-31 — End: 2023-12-31
  Administered 2023-12-31: 50 ug/min via INTRAVENOUS

## 2023-12-31 MED ORDER — LIDOCAINE 2% (20 MG/ML) 5 ML SYRINGE
INTRAMUSCULAR | Status: DC | PRN
  Administered 2023-12-31: 100 mg via INTRAVENOUS

## 2023-12-31 MED ORDER — ORAL CARE MOUTH RINSE: 15.0000 mL | Freq: Once | OROMUCOSAL | Status: AC

## 2023-12-31 MED ORDER — SUGAMMADEX SODIUM 200 MG/2ML IV SOLN
INTRAVENOUS | Status: DC | PRN
  Administered 2023-12-31: 200 mg via INTRAVENOUS

## 2023-12-31 MED ORDER — PROPOFOL 10 MG/ML IV BOLUS
INTRAVENOUS | Status: DC | PRN
  Administered 2023-12-31: 150 mg via INTRAVENOUS

## 2023-12-31 MED ORDER — LACTATED RINGERS IV SOLN: INTRAVENOUS | Status: DC

## 2023-12-31 MED ORDER — FENTANYL CITRATE (PF) 250 MCG/5ML IJ SOLN
INTRAMUSCULAR | Status: DC | PRN
  Administered 2023-12-31: 50 ug via INTRAVENOUS

## 2023-12-31 MED ORDER — EPHEDRINE SULFATE-NACL 50-0.9 MG/10ML-% IV SOSY
PREFILLED_SYRINGE | INTRAVENOUS | Status: DC | PRN
  Administered 2023-12-31: 10 mg via INTRAVENOUS

## 2023-12-31 MED ORDER — GADOBUTROL 1 MMOL/ML IV SOLN
7.5000 mL | Freq: Once | INTRAVENOUS | Status: AC | PRN
  Administered 2023-12-31: 7.5 mL via INTRAVENOUS

## 2023-12-31 MED ORDER — CHLORHEXIDINE GLUCONATE 0.12 % MT SOLN
OROMUCOSAL | Status: DC
Start: 2023-12-31 — End: 2023-12-31
  Filled 2023-12-31: qty 15

## 2023-12-31 MED ORDER — LACTATED RINGERS IV SOLN: INTRAVENOUS | Status: DC | PRN

## 2023-12-31 MED ORDER — CHLORHEXIDINE GLUCONATE 0.12 % MT SOLN
15.0000 mL | Freq: Once | OROMUCOSAL | Status: AC
  Administered 2023-12-31: 15 mL via OROMUCOSAL

## 2023-12-31 MED ORDER — PHENYLEPHRINE 80 MCG/ML (10ML) SYRINGE FOR IV PUSH (FOR BLOOD PRESSURE SUPPORT)
PREFILLED_SYRINGE | INTRAVENOUS | Status: DC | PRN
  Administered 2023-12-31 (×2): 160 ug via INTRAVENOUS

## 2023-12-31 MED ORDER — ONDANSETRON HCL 4 MG/2ML IJ SOLN
INTRAMUSCULAR | Status: DC | PRN
  Administered 2023-12-31: 4 mg via INTRAVENOUS

## 2023-12-31 MED ORDER — ROCURONIUM BROMIDE 10 MG/ML (PF) SYRINGE
PREFILLED_SYRINGE | INTRAVENOUS | Status: DC | PRN
  Administered 2023-12-31: 60 mg via INTRAVENOUS

## 2023-12-31 MED ORDER — FENTANYL CITRATE (PF) 100 MCG/2ML IJ SOLN
INTRAMUSCULAR | Status: AC
  Filled 2023-12-31: qty 2

## 2023-12-31 NOTE — Progress Notes (Signed)
 No pre-op labs needed per Dr. Peggye.  Joesph LOISE Stake, RN

## 2023-12-31 NOTE — Anesthesia Postprocedure Evaluation (Signed)
 Anesthesia Post Note  Patient: Jason [name]  Procedure(s) Performed: MRI CERVICAL SPINE WITH AND WITHOUT CONTRAST WITH ANESTHESIA     Patient location during evaluation: PACU Anesthesia Type: General Level of consciousness: awake Pain management: pain level controlled Vital Signs Assessment: post-procedure vital signs reviewed and stable Respiratory status: spontaneous breathing, nonlabored ventilation and respiratory function stable Cardiovascular status: blood pressure returned to baseline and stable Postop Assessment: no apparent nausea or vomiting Anesthetic complications: no   There were no known notable events for this encounter.  Last Vitals:  Vitals:   12/31/23 0915 12/31/23 0922  BP: 133/69 134/69  Pulse: (!) 51 (!) 53  Resp: 17 14  Temp:  (!) 36.4 C  SpO2: 98% 98%    Last Pain:  Vitals:   12/31/23 0915  TempSrc:   PainSc: 0-No pain                 Delon Aisha Arch

## 2023-12-31 NOTE — Transfer of Care (Signed)
 Immediate Anesthesia Transfer of Care Note  Patient: Jason [name]  Procedure(s) Performed: MRI CERVICAL SPINE WITH AND WITHOUT CONTRAST WITH ANESTHESIA  Patient Location: PACU  Anesthesia Type:General  Level of Consciousness: awake, alert , and oriented  Airway & Oxygen Therapy: Patient Spontanous Breathing and Patient connected to nasal cannula oxygen  Post-op Assessment: Report given to RN and Post -op Vital signs reviewed and stable  Post vital signs: Reviewed and stable  Last Vitals:  Vitals Value Taken Time  BP 133/72 12/31/23 09:00  Temp 36.4 C 12/31/23 08:50  Pulse 51 12/31/23 09:04  Resp 29 12/31/23 09:04  SpO2 98 % 12/31/23 09:04  Vitals shown include unfiled device data.  Last Pain:  Vitals:   12/31/23 0850  TempSrc:   PainSc: 0-No pain         Complications: No notable events documented.

## 2023-12-31 NOTE — Anesthesia Procedure Notes (Signed)
 Procedure Name: Intubation Date/Time: 12/31/2023 7:52 AM  Performed by: Scherrie Mast, CRNAPre-anesthesia Checklist: Patient identified, Emergency Drugs available, Suction available and Patient being monitored Patient Re-evaluated:Patient Re-evaluated prior to induction Oxygen Delivery Method: Circle System Utilized Preoxygenation: Pre-oxygenation with 100% oxygen Induction Type: IV induction Ventilation: Mask ventilation without difficulty Laryngoscope Size: Glidescope and 4 Grade View: Grade I Tube type: Oral Tube size: 7.0 mm Number of attempts: 1 Airway Equipment and Method: Stylet and Oral airway Placement Confirmation: ETT inserted through vocal cords under direct vision, positive ETCO2 and breath sounds checked- equal and bilateral Secured at: 23 cm Tube secured with: Tape Dental Injury: Teeth and Oropharynx as per pre-operative assessment

## 2024-01-01 ENCOUNTER — Encounter (HOSPITAL_COMMUNITY): Payer: Self-pay | Admitting: Radiology

## 2024-01-04 ENCOUNTER — Other Ambulatory Visit (HOSPITAL_COMMUNITY): Payer: Self-pay

## 2024-01-08 ENCOUNTER — Inpatient Hospital Stay: Payer: PRIVATE HEALTH INSURANCE | Attending: Anatomic Pathology & Clinical Pathology | Admitting: Internal Medicine

## 2024-01-08 ENCOUNTER — Ambulatory Visit (INDEPENDENT_AMBULATORY_CARE_PROVIDER_SITE_OTHER): Admitting: Physician Assistant

## 2024-01-08 ENCOUNTER — Encounter: Payer: Self-pay | Admitting: Internal Medicine

## 2024-01-08 VITALS — BP 131/77 | HR 54

## 2024-01-08 VITALS — BP 136/73 | HR 56 | Temp 95.7°F | Resp 18 | Ht 68.0 in

## 2024-01-08 DIAGNOSIS — E039 Hypothyroidism, unspecified: Secondary | ICD-10-CM | POA: Diagnosis not present

## 2024-01-08 DIAGNOSIS — C7940 Secondary malignant neoplasm of unspecified part of nervous system: Secondary | ICD-10-CM | POA: Diagnosis not present

## 2024-01-08 DIAGNOSIS — N3289 Other specified disorders of bladder: Secondary | ICD-10-CM

## 2024-01-08 DIAGNOSIS — M19019 Primary osteoarthritis, unspecified shoulder: Secondary | ICD-10-CM | POA: Insufficient documentation

## 2024-01-08 DIAGNOSIS — C61 Malignant neoplasm of prostate: Secondary | ICD-10-CM | POA: Insufficient documentation

## 2024-01-08 DIAGNOSIS — G549 Nerve root and plexus disorder, unspecified: Secondary | ICD-10-CM | POA: Diagnosis not present

## 2024-01-08 DIAGNOSIS — Z993 Dependence on wheelchair: Secondary | ICD-10-CM | POA: Insufficient documentation

## 2024-01-08 DIAGNOSIS — Z803 Family history of malignant neoplasm of breast: Secondary | ICD-10-CM | POA: Insufficient documentation

## 2024-01-08 DIAGNOSIS — I700 Atherosclerosis of aorta: Secondary | ICD-10-CM | POA: Diagnosis not present

## 2024-01-08 DIAGNOSIS — Z923 Personal history of irradiation: Secondary | ICD-10-CM | POA: Diagnosis not present

## 2024-01-08 DIAGNOSIS — M4802 Spinal stenosis, cervical region: Secondary | ICD-10-CM | POA: Insufficient documentation

## 2024-01-08 DIAGNOSIS — K5730 Diverticulosis of large intestine without perforation or abscess without bleeding: Secondary | ICD-10-CM | POA: Diagnosis not present

## 2024-01-08 DIAGNOSIS — M48061 Spinal stenosis, lumbar region without neurogenic claudication: Secondary | ICD-10-CM | POA: Diagnosis not present

## 2024-01-08 DIAGNOSIS — Z801 Family history of malignant neoplasm of trachea, bronchus and lung: Secondary | ICD-10-CM | POA: Insufficient documentation

## 2024-01-08 DIAGNOSIS — G8250 Quadriplegia, unspecified: Secondary | ICD-10-CM | POA: Insufficient documentation

## 2024-01-08 DIAGNOSIS — I110 Hypertensive heart disease with heart failure: Secondary | ICD-10-CM | POA: Insufficient documentation

## 2024-01-08 DIAGNOSIS — C7951 Secondary malignant neoplasm of bone: Secondary | ICD-10-CM | POA: Insufficient documentation

## 2024-01-08 DIAGNOSIS — Z8673 Personal history of transient ischemic attack (TIA), and cerebral infarction without residual deficits: Secondary | ICD-10-CM | POA: Diagnosis not present

## 2024-01-08 DIAGNOSIS — S46919A Strain of unspecified muscle, fascia and tendon at shoulder and upper arm level, unspecified arm, initial encounter: Secondary | ICD-10-CM | POA: Insufficient documentation

## 2024-01-08 DIAGNOSIS — Z79899 Other long term (current) drug therapy: Secondary | ICD-10-CM | POA: Insufficient documentation

## 2024-01-08 DIAGNOSIS — Z8701 Personal history of pneumonia (recurrent): Secondary | ICD-10-CM | POA: Insufficient documentation

## 2024-01-08 DIAGNOSIS — N3000 Acute cystitis without hematuria: Secondary | ICD-10-CM

## 2024-01-08 DIAGNOSIS — M75110 Incomplete rotator cuff tear or rupture of unspecified shoulder, not specified as traumatic: Secondary | ICD-10-CM | POA: Insufficient documentation

## 2024-01-08 DIAGNOSIS — Z7982 Long term (current) use of aspirin: Secondary | ICD-10-CM | POA: Insufficient documentation

## 2024-01-08 DIAGNOSIS — Z87442 Personal history of urinary calculi: Secondary | ICD-10-CM | POA: Insufficient documentation

## 2024-01-08 DIAGNOSIS — M75122 Complete rotator cuff tear or rupture of left shoulder, not specified as traumatic: Secondary | ICD-10-CM | POA: Insufficient documentation

## 2024-01-08 DIAGNOSIS — Z79890 Hormone replacement therapy (postmenopausal): Secondary | ICD-10-CM | POA: Diagnosis not present

## 2024-01-08 DIAGNOSIS — Z9089 Acquired absence of other organs: Secondary | ICD-10-CM | POA: Diagnosis not present

## 2024-01-08 DIAGNOSIS — Z435 Encounter for attention to cystostomy: Secondary | ICD-10-CM

## 2024-01-08 DIAGNOSIS — Z8249 Family history of ischemic heart disease and other diseases of the circulatory system: Secondary | ICD-10-CM | POA: Insufficient documentation

## 2024-01-08 DIAGNOSIS — R339 Retention of urine, unspecified: Secondary | ICD-10-CM | POA: Diagnosis not present

## 2024-01-08 DIAGNOSIS — Z813 Family history of other psychoactive substance abuse and dependence: Secondary | ICD-10-CM | POA: Insufficient documentation

## 2024-01-08 DIAGNOSIS — Z823 Family history of stroke: Secondary | ICD-10-CM | POA: Insufficient documentation

## 2024-01-08 MED ORDER — SULFAMETHOXAZOLE-TRIMETHOPRIM 800-160 MG PO TABS: 1.0000 | ORAL_TABLET | Freq: Two times a day (BID) | ORAL | 0 refills | Status: AC

## 2024-01-08 NOTE — Progress Notes (Signed)
 [facility] [address]  [place], [place] [address] [phone]   Interval Evaluation  Date of Service: 01/08/24 Patient Name: Jason [name] Patient MRN: [id number] Patient DOB: 1955/05/15 Provider: Arthea MARLA Manns, MD  Identifying Statement:  RAYDEN DOCK is a 68 y.o. 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  Associated epidural disease greater at the C6 where there is marked canal stenosis without cord compression.  Left greater than right foraminal effacement due to the extraosseous disease.   10/03/2021 Imaging   MRI left shoulder showed High-riding humeral head with massive full-thickness tear of the entire supraspinatus tendon and the anterior 50% of the infraspinatus tendon. Additional partial-thickness articular sided tearing of the posterior infraspinatus. Moderate to high-grade supraspinatus and infraspinatus muscle atrophy suggests these tears  are chronic. Partial-thickness tearing of the superior greater than inferior  aspects of the subscapularis tendon footprint. Mild subscapularis muscle atrophy.  Moderate degenerative changes of the acromioclavicular joint. Moderate glenohumeral cartilage degenerative changes.   10/05/2021 Imaging   CT chest abdomen pelvis with contrast showed  Moderate severity retroperitoneal and pelvic lymphadenopathy,consistent with metastatic disease. Small lytic areas at the levels of L4 and S1, with diffusely sclerotic changes involving the second and fourth right ribs, sacrum and left iliac bone. These findings are likely consistent with osseous  metastasis. 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  Sigmoid diverticulosis. Aortic atherosclerosis   10/05/2021 Imaging   CT cervical spine without contrast Extensive heterogeneous sclerotic lesions throughout the cervical spine compatible with metastatic disease to the bone.  Mixed sclerotic and lytic changes in the posterior elements at C5 and C6.Extraosseous tumor at C5 and C6 is better appreciated on the MRI scan.   10/05/2021 Tumor Marker   PSA >1500   10/06/2021 Cancer Staging   Staging form: Prostate, AJCC 8th Edition - Clinical stage from 10/06/2021: Stage IVB (cN1, pM1c, PSA: 1500) - Signed by Babara Call, MD on 12/04/2021 Stage prefix: Initial diagnosis Prostate specific antigen (PSA) range: 20 or greater   10/08/2021 Imaging   MRI thoracic spine and the lumbar spine with and without contrast 1. 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 2. 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 3. Additional epidural tumor along the posterior endplates at T9,T11, and T12 without high-grade spinal canal stenosis or cord compression. 4. Extensive epidural tumor in the lumbar spine is greatest in thickness at L1 without high-grade spinal canal stenosis at this level; however, superimposed on pre-existing degenerative changes lower in the lumbar spine results in severe spinal canal stenosis with cauda equina nerve root compression at L3-L4 and L4-L5. Multilevel neural foraminal stenosis is detailed above. 5. Postsurgical changes reflecting C6 laminectomy unchanged spinal canal stenosis compared to the postoperative study from 1 day prior.No definite cord signal abnormality. 6.  Extramedullary fluid collection along the dorsal aspect of the cord beginning at T9-T10 extending into the lumbar spine is likely subdural in location but  is of uncertain etiology; evolving blood products not excluded. The collection anteriorly displaces the cord and cauda equina nerve roots without frank cord compression or signal abnormality. There is no peripheral enhancement to suggest abscess. 7. Prevertebral edema in the cervical spine which may be postsurgical in nature. 8. New bilateral lower lobe consolidations may reflect atelectasis or developing pneumonia/aspiration.   10/08/2021 Imaging   MRI brain with and without contrast showed 1. 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 There is intracranial extension of tumor along the right frontal convexity measuring up to 5 mm in thickness without mass effect on the underlying brain parenchyma or midline shift. Extracranial extension of tumor in this location measures up to approximately 3 mm. 2. Additional extracranial extension of tumor into the scalp along the vertex measuring up to 7 mm in thickness. 3. Small focus of diffusion restriction in the right parietal cortex most in keeping with a small acute infarct. 4. No evidence of parenchymal metastatic disease.   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 10/06/2021, patient underwent C6 cervical laminectomy and resection of tumor.  Pathology came back positive for metastatic prostate cancer. 8/14 developed significant bilateral upper and lower extremity weakness with hypotension requiring transfer to the ICU, pressors, steroid, respiratory failure on mechanical ventilation. Later patient was stabilized and extubated.  8/16 Firmagon  loading dose 240mg   9/15 Eligard  22.5mg  given inpatient during his rehab admission.     10/2021 -   Radiation Therapy   Palliative radiation to spine.    12/04/2021 Tumor Marker   PSA 67.09   01/13/2022 Tumor Marker   PSA 43.95   01/23/2022 -  Chemotherapy   Started on Xtandi  160mg  daily.    12/14/2023 Imaging   PSMA PET scan 1. Multifocal PSMA-avid skeletal metastases with increased radiotracer intensity in multiple lesions compared to prior, without definite new metastatic sites. 2. No evidence of recurrence in the prostatectomy bed. 3. No PSMA-avid pelvic or retroperitoneal lymph nodes.      CNS Oncologic History 10/06/21: Laminectomy, resection of epidural tumor ~C6 with Dr. Katrina.   11/13/21: Post-op radiation, 30/10 from C4 to T2 with Dr. Patrcia  Interval History: Toribio FORBES Mam presents today for follow up after recent cervical spine MRI study.  No significant changes with regards to weakness in both legs, left arm.  Now has a suprapubic catheter.  He remains in the motorized wheelchair, is working aggressively with physical and occupational therapy.  Continues on prostate cancer therapy with Dr. Babara, may be starting on Pluvicto.  No new or progressive symptoms otherwise.  H+P (12/20/21) Patient presented to neurologic attention in August with new onset numbness in left arm and hand.  This had been proceeded by several months of pain in back of his neck.  Spine imaging demonstrated diffuse metastatic burden and tumor infiltrating spinal column in cervical region.  He underwent laminectomy and debulking of epidural tumor with Dr. Katrina on 10/06/21 at [place].  This was then followed by post-operative radiation therapy with Dr. Patrcia in [place], also to cervical and upper thoracic region, complete 9/20 after 10 sessions.  No surgery or radiation has been focused on lower spine to this point.  At present, he is wheelchair bound due to bilateral leg weakness, which is improving gradually through aggressive PT.  His arms and hands are functional aside from more  chronic weakness in his  left shoulder.  He is incontinent of urine and has indwelling foley, but he does have bladder and saddle sensation intact.     Medications: Current Outpatient Medications on File Prior to Visit  Medication Sig Dispense Refill   acetaminophen  (TYLENOL ) 500 MG tablet Take 1,000 mg by mouth 2 (two) times daily.     ascorbic acid  (VITAMIN C ) 500 MG tablet Take 1 tablet (500 mg total) by mouth daily. 30 tablet 0   aspirin  EC 81 MG tablet Take 1 tablet (81 mg total) by mouth daily. Swallow whole. 30 tablet 12   atorvastatin  (LIPITOR) 40 MG tablet Take 1 tablet (40 mg total) by mouth daily. 90 tablet 3   baclofen  (LIORESAL ) 10 MG tablet Take 0.5-1 tablets (5-10 mg total) by mouth 2 (two) times daily as needed for muscle spasms. (Patient taking differently: Take 10 mg by mouth daily.) 60 each 5   cetirizine (ZYRTEC) 10 MG tablet Take 10 mg by mouth at bedtime.     clonazePAM  (KLONOPIN ) 0.5 MG tablet Take 1 tablet (0.5 mg total) by mouth 2 (two) times daily. 60 tablet 0   CRANBERRY PO Take 4,200 mg by mouth 2 (two) times daily.     cyanocobalamin (VITAMIN B12) 1000 MCG tablet Take 1,000 mcg by mouth daily.     darolutamide  (NUBEQA ) 300 MG tablet Take 2 tablets (600 mg total) by mouth 2 (two) times daily with a meal. 120 tablet 3   famotidine  (PEPCID ) 10 MG tablet Take 10 mg by mouth as needed. (Patient taking differently: Take 20 mg by mouth at bedtime.)     FLUoxetine  (PROZAC ) 20 MG capsule Take 1 capsule (20 mg total) by mouth at bedtime. 90 capsule 3   fluticasone  (FLONASE ) 50 MCG/ACT nasal spray Place 1 spray into both nostrils daily. 48 g 3   guaiFENesin  (MUCINEX ) 600 MG 12 hr tablet Take 300 mg by mouth 2 (two) times daily.     levothyroxine  (SYNTHROID ) 88 MCG tablet Take 1 tablet (88 mcg total) by mouth daily. 90 tablet 3   metoprolol  succinate (TOPROL -XL) 25 MG 24 hr tablet Take 1 tablet (25 mg total) by mouth at bedtime. Take with or immediately following a meal. 90 tablet  3   Multiple Vitamin (MULTIVITAMIN) tablet Take 1 tablet by mouth daily.     potassium chloride  SA (KLOR-CON  M) 20 MEQ tablet Take 1 tablet (20 mEq total) by mouth at bedtime. 90 tablet 3   sacubitril-valsartan (ENTRESTO ) 24-26 MG Take 1 tablet by mouth 2 (two) times daily. 180 tablet 3   No current facility-administered medications on file prior to visit.    Allergies: No Known Allergies Past Medical History:  Past Medical History:  Diagnosis Date   Allergy    Arthritis    osteoarthritis   Cancer (HCC)    prostate   CHF (congestive heart failure) (HCC)    GERD (gastroesophageal reflux disease)    History of kidney stones    passed   Hypertension    Hypothyroidism    Palpitations 2022   Paralysis (HCC)    paraplegia 09/2021-surgery or stroke   Pneumonia    12/22/28- > 25 years ago   Stroke [facility])    Mini stroke - Aug 2023   Thyroid  disease    Hyperthyroidism s/p radioactive iodine ablation   Past Surgical History:  Past Surgical History:  Procedure Laterality Date   CARDIAC CATHETERIZATION  03/2000   HERNIA REPAIR  1961   umbilical   IR CATHETER TUBE  CHANGE  12/12/2022   IR US  GUIDE BX ASP/DRAIN  11/11/2022   LEFT HEART CATH AND CORONARY ANGIOGRAPHY Left 03/20/2020   Procedure: LEFT HEART CATH AND CORONARY ANGIOGRAPHY;  Surgeon: Mady Bruckner, MD;  Location: [facility] INVASIVE CV LAB;  Service: Cardiovascular;  Laterality: Left;   POSTERIOR CERVICAL FUSION/FORAMINOTOMY N/A 10/06/2021   Procedure: POSTERIOR CERVICAL FUSION/ FORAMINOTOMY LEVEL 3;  Surgeon: Clois Fret, MD;  Location: [facility] ORS;  Service: Neurosurgery;  Laterality: N/A;  will need monitoring   RADIOLOGY WITH ANESTHESIA Left 04/18/2021   Procedure: MRI SHOULDER WITHOUT CONTRAST WITH ANESTHESIA;  Surgeon: Radiologist, Medication, MD;  Location: [facility] OR;  Service: Radiology;  Laterality: Left;   RADIOLOGY WITH ANESTHESIA N/A 10/03/2021   Procedure: MRI CERVICAL SPINE WITH ANESTHESIA;  Surgeon: Radiologist,  Medication, MD;  Location: [facility] OR;  Service: Radiology;  Laterality: N/A;   RADIOLOGY WITH ANESTHESIA N/A 01/30/2022   Procedure: MRI CERVICAL SPINE WITH AND WITOHOUT CONTRAST WITH ANESTHESIA;  Surgeon: Radiologist, Medication, MD;  Location: [facility] OR;  Service: Radiology;  Laterality: N/A;   RADIOLOGY WITH ANESTHESIA N/A 05/22/2022   Procedure: MRI LUMBER SPINE AND CERVIC SPINE WITH AND WITHOUT CONTRAST;  Surgeon: Radiologist, Medication, MD;  Location: [facility] OR;  Service: Radiology;  Laterality: N/A;   RADIOLOGY WITH ANESTHESIA N/A 12/25/2022   Procedure: MRI WITH ANESTHESIA OF CERVICAL SPINE WITH AND WITHOUT CONTRAST,CARDIAC MORPHOLOGY;  Surgeon: Radiologist, Medication, MD;  Location: [facility] OR;  Service: Radiology;  Laterality: N/A;   RADIOLOGY WITH ANESTHESIA N/A 12/31/2023   Procedure: MRI CERVICAL SPINE WITH AND WITHOUT CONTRAST WITH ANESTHESIA;  Surgeon: Radiologist, Medication, MD;  Location: [facility] OR;  Service: Radiology;  Laterality: N/A;   REVERSE SHOULDER ARTHROPLASTY Left    ROTATOR CUFF REPAIR  11/2010   Dr Cleotilde   SUPRAPUBIC CATHETER INSERTION  11/11/2022   TONSILLECTOMY     TOTAL HIP ARTHROPLASTY Right 02/24/2009   TOTAL HIP ARTHROPLASTY Left 06/2016   Dr Leora   Social History:  Social History   Socioeconomic History   Marital status: Married    Spouse name: [name]    Number of children: 0   Years of education: Not on file   Highest education level: Bachelor's degree (e.g., BA, AB, BS)  Occupational History   Occupation: Warden/ranger: [employer]  Tobacco Use   Smoking status: Never    Passive exposure: Never   Smokeless tobacco: Never  Vaping Use   Vaping status: Never Used  Substance and Sexual Activity   Alcohol use: Not Currently   Drug use: No   Sexual activity: Not Currently  Other Topics Concern   Not on file  Social History Narrative   Lives at home with spouse.    Social Drivers of Corporate Investment Banker Strain: Low Risk  (08/18/2023)    Overall Financial Resource Strain (CARDIA)    Difficulty of Paying Living Expenses: Not hard at all  Food Insecurity: No Food Insecurity (08/18/2023)   Hunger Vital Sign    Worried About Running Out of Food in the Last Year: Never true    Ran Out of Food in the Last Year: Never true  Transportation Needs: No Transportation Needs (08/18/2023)   PRAPARE - Administrator, Civil Service (Medical): No    Lack of Transportation (Non-Medical): No  Physical Activity: Inactive (08/18/2023)   Exercise Vital Sign    Days of Exercise per Week: 0 days    Minutes of Exercise per Session: Not on file  Stress:  No Stress Concern Present (08/18/2023)   [employer] of Occupational Health - Occupational Stress Questionnaire    Feeling of Stress: Only a little  Social Connections: Moderately Isolated (08/18/2023)   Social Connection and Isolation Panel    Frequency of Communication with Friends and Family: More than three times a week    Frequency of Social Gatherings with Friends and Family: More than three times a week    Attends Religious Services: Never    Database Administrator or Organizations: No    Attends Engineer, Structural: Not on file    Marital Status: Married  Catering Manager Violence: Not on file   Family History:  Family History  Problem Relation Age of Onset   Hypertension Mother    Stroke Mother    Gout Father    Heart attack Father 59   Heart disease Sister    Lung cancer Sister    Drug abuse Sister    Breast cancer Paternal Aunt    Lung cancer Maternal Grandmother    Colon cancer Neg Hx    Esophageal cancer Neg Hx    Rectal cancer Neg Hx    Stomach cancer Neg Hx     Review of Systems: Constitutional: Doesn't report fevers, chills or abnormal weight loss Eyes: Doesn't report blurriness of vision Ears, nose, mouth, throat, and face: Doesn't report sore throat Respiratory: Doesn't report cough, dyspnea or wheezes Cardiovascular: Doesn't report  palpitation, chest discomfort  Gastrointestinal:  Doesn't report nausea, constipation, diarrhea GU: Doesn't report incontinence Skin: Doesn't report skin rashes Neurological: Per HPI Musculoskeletal: Doesn't report joint pain Behavioral/Psych: Doesn't report anxiety  Physical Exam: Vitals:   01/08/24 1116 01/08/24 1131  BP: (!) 144/72 136/73  Pulse: (!) 56   Resp: 18   Temp: (!) 95.7 F (35.4 C)   SpO2: 98%      KPS: 50. General: Alert, cooperative, pleasant, in no acute distress Head: Normal EENT: No conjunctival injection or scleral icterus.  Lungs: Resp effort normal Cardiac: Regular rate Abdomen: indwelling foley Skin: No rashes cyanosis or petechiae. Extremities: No clubbing or edema  Neurologic Exam: Mental Status: Awake, alert, attentive to examiner. Oriented to self and environment. Language is fluent with intact comprehension.  Cranial Nerves: Visual acuity is grossly normal. Visual fields are full. Extra-ocular movements intact. No ptosis. Face is symmetric Motor: Spastic tone.  Left arm 3/5 at deltoid, 5/5 elsewhere.  Some impairment in fine motor function.  Legs 3/5 bilaterally, spastic and hyperreflexic. Sensory: Intact to light touch Gait: Non ambulatory   Labs: I have reviewed the data as listed    Component Value Date/Time   NA 133 (L) 11/13/2023 1141   NA 136 08/14/2023 1418   K 4.0 11/13/2023 1141   CL 100 11/13/2023 1141   CO2 28 11/13/2023 1141   GLUCOSE 86 11/13/2023 1141   BUN 11 11/13/2023 1141   BUN 10 08/14/2023 1418   CREATININE 0.48 (L) 11/13/2023 1141   CALCIUM  8.5 (L) 11/13/2023 1141   PROT 6.2 (L) 11/13/2023 1141   PROT 7.2 01/29/2021 0822   ALBUMIN  3.5 11/13/2023 1141   ALBUMIN  3.8 (L) 09/19/2022 1458   AST 32 11/13/2023 1141   ALT 35 11/13/2023 1141   ALKPHOS 102 11/13/2023 1141   BILITOT 1.1 11/13/2023 1141   GFRNONAA >60 11/13/2023 1141   GFRAA 106 03/14/2020 1254   Lab Results  Component Value Date   WBC 7.1  11/13/2023   NEUTROABS 5.5 11/13/2023   HGB 11.9 (L)  11/13/2023   HCT 35.9 (L) 11/13/2023   MCV 102.0 (H) 11/13/2023   PLT 221 11/13/2023    Imaging:  MR CERVICAL SPINE W WO CONTRAST Result Date: 12/31/2023 EXAM: MRI CERVICAL SPINE WITH AND WITHOUT CONTRAST 12/31/2023 08:53:42 AM TECHNIQUE: Multiplanar multisequence MRI of the cervical spine was performed without and with the administration of intravenous contrast. CONTRAST: 7.5 mL of Gadavist . COMPARISON: MR Cervical spine 12/25/2022 and earlier. CLINICAL HISTORY: 68 year old male Metastatic prostate cancer. 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 Restaging. FINDINGS: BONES AND ALIGNMENT: Heterogeneous intrinsic increased T1 signal throughout the visible cervical spine suggesting previous radiation. Patchy STIR hyperintensity and enhancement at the C6 through C7 vertebral levels is noted without a discrete or destructive vertebral metastasis there. Chronic posterior decompression at C5-C6, tiny chronic laminectomy space seroma appears stable and inconsequential. Visible upper thoracic levels are heterogeneous, but areas of suspicious decreased marrow T1 signal (such as at the T2 vertebral body) have regressed. Prior surgery and evidence of radiation treatment with no progressed or distinct vertebral metastatic disease now in the cervical spine. SPINAL CORD: Mildly decreased spinal cord volume. Mild chronic spinal cord T2 heterogeneity at the C5 and C6 levels is stable (series 8, image 27). No cord edema. No abnormal intradural enhancement. No abnormal dural thickening now. Negative visible cervicomedullary junction and posterior fossa. Stable mild cervical spinal cord volume loss and heterogeneity. SOFT TISSUES: Small volume retained secretions layering in the pharynx. Maintained major vascular flow voids in the bilateral neck. No paraspinal soft tissue mass  identified. IMPRESSION: 1. Prior surgery and evidence of radiation treatment with no progressed or distinct vertebral metastatic disease now in the cervical spine 2. Stable mild cervical spinal cord volume loss and heterogeneity. 3. No degenerative or malignant cervical spinal stenosis now. Electronically signed by: Helayne Hurst MD 12/31/2023 09:34 AM EST RP Workstation: [id number]   NM Radiologist Eval And Mgmt Result Date: 12/29/2023 EXAM: NEW PATIENT OFFICE VISIT CHIEF COMPLAINT: Metastatic castrate sensitive prostate carcinoma Current Pain Level: 1-10 HISTORY OF PRESENT ILLNESS: 68 year old male with metastatic prostate carcinoma. Carcinoma identified at time of surgery for shoulder arthropathy. Widespread skeletal metastasis on presentation with PSA greater than 1,500. Patient have significant involvement the upper cervical spine with subsequent decompression for spinal cord syndrome. Subsequently patient suffered neurologic deficit and now has limited use of arms and legs and is wheelchair and care dependent. Patient has a suprapubic catheter. Patient currently on Eligard .  Patient on androgen antagonist Nubeqa  Most recently PSMA is increased from 5.4 to 14.5 in September 2025. PSMA PET scan 12/11/2023 demonstrate progressive skeletal metastasis within the axillary and appendicular skeleton. REVIEW OF SYSTEMS: See epic note PHYSICAL EXAMINATION: See epic note ASSESSMENT AND PLAN: See epic note Electronically Signed   By: Jackquline Boxer M.D.   On: 12/29/2023 12:01   NM PET (PSMA) SKULL TO MID THIGH Result Date: 12/14/2023 EXAM: PROSTATE PET SKULL BASE TO MID THIGHS 12/11/2023 02:16:42 PM TECHNIQUE: RADIOPHARMACEUTICAL:  mCi PSMA-targeting radiopharmaceutical injected intravenously. PET imaging was obtained from skull vertex to mid thighs. Computed tomography was used for attenuation correction and localization. Fusion imaging was obtained. Uptake time  mins. COMPARISON: PSMA PET scan 08/17/2023.  CLINICAL HISTORY: Elevated PSA, prostate cancer. 8.4mCi F-18 FDG RAC IV inj @1251 ; NM PET IMAGE PSMA; PROSTATE CANCER METASTATIC TO CENTRAL NERVOUS SYSTEM; PSA 11/13/23 14.47. 08/07/23 5.35; CURRENTLY TAKING DAROLUTAMIDE  ; 08/17/23 NUCLEAR MEDICINE PET SKULL BASE TO THIGH. FINDINGS:  PROSTATE AND PROSTATE BED: No recurrence in the prostatectomy bed. LYMPH NODES: No PSMA avid pelvic lymph nodes or retroperitoneal lymph nodes. BONES: Again demonstrated multifocal PSMA avid skeletal metastasis. Several of the lesions are increased in intensity compared to prior. For example, lesion in the left iliac wing with SUVmax equal 127 compared to SUVmax equal 86 on image 124. Lesion at T10 with SUVmax equal 99 compared to SUVmax equal 27 on image 67. Several rib lesions have expanded in activity, for example, posterior left 4th rib with more bone involvement on image 47; activity is similar. Lesion in the left scapula with SUVmax equal 102 compared to SUVmax equal 39. There are no clear new lesions identified other than expansile rib lesions. Overall pattern of skeletal metastasis is similar to prior with increase in radiotracer intensity of several lesions and several lesions slightly greater bone involvement. No clear new sites of skeletal metastases. OTHER PET FINDINGS: Physiologic activity within the salivary glands, spleen, kidneys, bowel, and urinary bladder. No suspicious pulmonary nodules. No liver metastasis. IMPRESSION: 1. Multifocal PSMA-avid skeletal metastases with increased radiotracer intensity in multiple lesions compared to prior, without definite new metastatic sites. 2. No evidence of recurrence in the prostatectomy bed. 3. No PSMA-avid pelvic or retroperitoneal lymph nodes. 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  MRI cervical spine demonstrates stable findings consistent with post radiation effects, now more than 2 years following radiation therapy.    The MRIs are cumbersome to obtain for him due to his functional impairment and difficulty laying flat.  We recommended resuming dedicated spine imaging only as needed with new or progressive symptoms.  Will con't to follow with Dr. Katrina, Dr. Babara, urology team.  We appreciate the opportunity to participate in the care of LAMARION MCEVERS.   We ask that Toribio FORBES Mam return to clinic as needed.  All questions were answered. The patient knows to call the clinic with any problems, questions or concerns. No barriers to learning were detected.  The total time spent in the encounter was 40 minutes and more than 50% was on counseling and review of test results   Arthea MARLA Manns, MD Medical Director of Neuro-Oncology [facility] 01/08/24 10:52 AM

## 2024-01-08 NOTE — Progress Notes (Signed)
 Suprapubic Cath Change  Patient is present today for a suprapubic catheter change due to urinary retention.  8ml of water was drained from the balloon, a 16FR Silastic foley cath was removed from the tract without difficulty.  Site was cleaned and prepped in a sterile fashion with betadine.  A 16FR foley cath was replaced into the tract no complications were noted. Urine return was noted, 10 ml of sterile water was inflated into the balloon and a night bag was attached for drainage.  Patient tolerated well.  Performed by: Fergus Throne, PA-C   Additional notes: He reports increased bladder spasms, muscle spasms x1 week. Urine sample obtained from new SPT today. Will send for culture and start empiric Bactrim DS BID x7 days.  Follow up: Return in about 4 weeks (around 02/05/2024) for SPT exchange.

## 2024-01-11 ENCOUNTER — Encounter: Payer: Self-pay | Admitting: Oncology

## 2024-01-13 ENCOUNTER — Inpatient Hospital Stay: Admitting: Licensed Clinical Social Worker

## 2024-01-13 ENCOUNTER — Telehealth: Payer: Self-pay

## 2024-01-13 LAB — CULTURE, URINE COMPREHENSIVE

## 2024-01-13 NOTE — Written Directive (Cosign Needed)
  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

## 2024-01-13 NOTE — Telephone Encounter (Signed)
 Clinical Social Work received referral for transportation.  CSW left a voicemail for patient to investigate if his Medicare has a transportation benefit.

## 2024-01-13 NOTE — Progress Notes (Signed)
 [facility] CSW Progress Note  Clinical Child Psychotherapist contacted caregiver by phone to follow-up on need for community resources.    Interventions: Provided patient with information about transportation resources.  Encouraged [name] to contact patient's insurance company to determine if there is a transportation benefit.       Follow Up Plan:  CSW will follow-up with patient by phone     Macario CHRISTELLA Au, LCSW Clinical Social Worker [facility]

## 2024-01-14 ENCOUNTER — Ambulatory Visit: Payer: Self-pay | Admitting: Physician Assistant

## 2024-01-14 ENCOUNTER — Telehealth: Payer: Self-pay | Admitting: Pharmacy Technician

## 2024-01-14 ENCOUNTER — Encounter (HOSPITAL_COMMUNITY)
Admission: RE | Admit: 2024-01-14 | Discharge: 2024-01-14 | Disposition: A | Discharge status: Home / Self Care | Source: Ambulatory Visit | Attending: Oncology | Admitting: Oncology

## 2024-01-14 VITALS — BP 124/63 | HR 57

## 2024-01-14 DIAGNOSIS — C61 Malignant neoplasm of prostate: Secondary | ICD-10-CM | POA: Diagnosis present

## 2024-01-14 DIAGNOSIS — C7940 Secondary malignant neoplasm of unspecified part of nervous system: Secondary | ICD-10-CM | POA: Insufficient documentation

## 2024-01-14 LAB — COMPREHENSIVE METABOLIC PANEL WITH GFR
ALT: 35 U/L (ref 0–44)
AST: 37 U/L (ref 15–41)
Albumin: 3.9 g/dL (ref 3.5–5.0)
Alkaline Phosphatase: 123 U/L (ref 38–126)
Anion gap: 8 (ref 5–15)
BUN: 8 mg/dL (ref 8–23)
CO2: 28 mmol/L (ref 22–32)
Calcium: 9.1 mg/dL (ref 8.9–10.3)
Chloride: 99 mmol/L (ref 98–111)
Creatinine, Ser: 0.57 mg/dL — ABNORMAL LOW (ref 0.61–1.24)
GFR, Estimated: >60 mL/min (ref >60)
Glucose, Bld: 87 mg/dL (ref 70–99)
Potassium: 4.1 mmol/L (ref 3.5–5.1)
Sodium: 135 mmol/L (ref 135–145)
Total Bilirubin: 0.8 mg/dL (ref 0.0–1.2)
Total Protein: 6.7 g/dL (ref 6.5–8.1)

## 2024-01-14 LAB — CBC WITH DIFFERENTIAL/PLATELET
Abs Immature Granulocytes: 0.01 K/uL (ref 0.00–0.07)
Basophils Absolute: 0.1 K/uL (ref 0.0–0.1)
Basophils Relative: 1 %
Eosinophils Absolute: 0.6 K/uL — ABNORMAL HIGH (ref 0.0–0.5)
Eosinophils Relative: 11 %
HCT: 35.5 % — ABNORMAL LOW (ref 39.0–52.0)
Hemoglobin: 11.8 g/dL — ABNORMAL LOW (ref 13.0–17.0)
Immature Granulocytes: 0 %
Lymphocytes Relative: 18 %
Lymphs Abs: 1 K/uL (ref 0.7–4.0)
MCH: 34 pg (ref 26.0–34.0)
MCHC: 33.2 g/dL (ref 30.0–36.0)
MCV: 102.3 fL — ABNORMAL HIGH (ref 80.0–100.0)
Monocytes Absolute: 0.4 K/uL (ref 0.1–1.0)
Monocytes Relative: 8 %
Neutro Abs: 3.4 K/uL (ref 1.7–7.7)
Neutrophils Relative %: 62 %
Platelets: 272 K/uL (ref 150–400)
RBC: 3.47 MIL/uL — ABNORMAL LOW (ref 4.22–5.81)
RDW: 13.3 % (ref 11.5–15.5)
WBC: 5.4 K/uL (ref 4.0–10.5)
nRBC: 0 % (ref 0.0–0.2)

## 2024-01-14 LAB — PSA: Prostatic Specific Antigen: 32.83 ng/mL — ABNORMAL HIGH (ref 0.00–4.00)

## 2024-01-14 MED ORDER — SODIUM CHLORIDE 0.9 % IV BOLUS
1000.0000 mL | Freq: Once | INTRAVENOUS | Status: AC
  Administered 2024-01-14: 1000 mL via INTRAVENOUS

## 2024-01-14 MED ORDER — LUTETIUM LU 177 VIPIVOTIDE TET 1000 MBQ/ML IV SOLN
200.0000 | Freq: Once | INTRAVENOUS | Status: AC
  Administered 2024-01-14: 200 via INTRAVENOUS

## 2024-01-14 NOTE — Telephone Encounter (Addendum)
 Oral Oncology Patient Advocate Encounter   Submitted application for assistance for Nubeqa  to [employer] [place]  Patient Foundation.   Application submitted via e-fax to [phone].   [employer] phone number [phone].   I will continue to check the status until final determination.   Kady Toothaker (Patty) Chet Burnet, CPhT  [facility], [place], [facility] - Oral Chemotherapy Patient Advocate Specialist III Phone: [phone]  Fax: [phone]

## 2024-01-14 NOTE — Progress Notes (Signed)
 EXAM: NUCLEAR MEDICINE PLUVICTO INJECTION 01/14/2024 12:39:36 PM TECHNIQUE: Patient presented to nuclear medicine for treatment of metastatic prostate cancer. PSMA PET scan was reviewed. The patient's most recent labs were reviewed and it was determined to proceed with Lu-177 Pluvicto. Written informed consent was obtained from the patient. Written instructions for radiation safety were provided. The radiopharmaceutical was injected intravenously according to local protocol. 1st of 6 treatments. Baseline labs obtained. Patient reports no interval complaints from consult. Patient wheelchair bound with incomplete quadriplegia. Suprapubic catheter. Patient accompanied by wife. 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 FINDINGS: Current Infusion: 1st of 6 treatments. Planned Infusions: 6 IMPRESSION: Treatment 1 of 6 planned Lu-177 Pluvicto administered without complication. Plan for next infusion in 6 weeks.

## 2024-01-14 NOTE — Telephone Encounter (Signed)
 Oral Oncology Patient Advocate Encounter   Began application for re-enrollment for assistance for Nubeqa  through [employer] [place]  Patient [employer].   Application will be submitted upon completion of necessary supporting documentation.   [employer] phone number [phone].   Wynton Hufstetler (Patty) Chet Burnet, CPhT  [facility], [place], [facility] - Oral Chemotherapy Patient Advocate Specialist III Phone: [phone]  Fax: [phone]

## 2024-01-14 NOTE — Progress Notes (Signed)
 Pt tolerated pluvicto treatment. No symptoms endorsed with this visit.

## 2024-01-19 ENCOUNTER — Encounter: Payer: Self-pay | Admitting: Oncology

## 2024-02-03 ENCOUNTER — Encounter: Payer: Self-pay | Admitting: Oncology

## 2024-02-04 ENCOUNTER — Ambulatory Visit (INDEPENDENT_AMBULATORY_CARE_PROVIDER_SITE_OTHER): Admitting: Physician Assistant

## 2024-02-04 DIAGNOSIS — R339 Retention of urine, unspecified: Secondary | ICD-10-CM | POA: Diagnosis not present

## 2024-02-04 DIAGNOSIS — L304 Erythema intertrigo: Secondary | ICD-10-CM

## 2024-02-04 DIAGNOSIS — B372 Candidiasis of skin and nail: Secondary | ICD-10-CM | POA: Diagnosis not present

## 2024-02-04 DIAGNOSIS — Z435 Encounter for attention to cystostomy: Secondary | ICD-10-CM

## 2024-02-04 MED ORDER — KETOCONAZOLE 2 % EX CREA: TOPICAL_CREAM | CUTANEOUS | 1 refills | Status: AC

## 2024-02-04 NOTE — Progress Notes (Signed)
 Suprapubic Cath Change  Patient is present today for a suprapubic catheter change due to urinary retention.  8ml of water was drained from the balloon, a 16FR foley cath was removed from the tract with out difficulty.  Site was cleaned and prepped in a sterile fashion with betadine.  A 16FR foley cath was replaced into the tract no complications were noted. Urine return was noted, 10 ml of sterile water was inflated into the balloon and a night bag was attached for drainage.  Patient tolerated well.  Performed by: Gazelle Towe, PA-C   Additional Notes: Candidal intertrigo on physical exam today.  Sending in topical ketoconazole.  Follow up: Return in about 4 weeks (around 03/03/2024) for SPT exchange.

## 2024-02-12 ENCOUNTER — Encounter: Payer: Self-pay | Admitting: Oncology

## 2024-02-12 MED ORDER — SACUBITRIL-VALSARTAN 24-26 MG PO TABS: 1.0000 | ORAL_TABLET | Freq: Two times a day (BID) | ORAL | 3 refills | Status: AC

## 2024-02-12 NOTE — Telephone Encounter (Signed)
 Oral Oncology Patient Advocate Encounter   Received notification that the application for assistance for Nubeqa  through [employer] [place]  Patient [employer] has been approved.   [employer] [place]  Patient [employer] phone number [phone].   Effective dates: 02/25/2024 through 02/23/2025  Medication will be filled at [facility].  Eligha Kmetz (Patty) Chet Burnet, CPhT  [facility], [place], [facility] - Oral Chemotherapy Patient Advocate Specialist III Phone: [phone]  Fax: [phone]

## 2024-02-15 ENCOUNTER — Ambulatory Visit: Admitting: Oncology

## 2024-02-15 ENCOUNTER — Inpatient Hospital Stay: Attending: Anatomic Pathology & Clinical Pathology

## 2024-02-15 ENCOUNTER — Encounter: Payer: Self-pay | Admitting: Oncology

## 2024-02-15 ENCOUNTER — Ambulatory Visit

## 2024-02-15 VITALS — BP 117/68 | HR 54 | Temp 97.3°F | Resp 18 | Ht 68.0 in | Wt 155.0 lb

## 2024-02-15 DIAGNOSIS — G8250 Quadriplegia, unspecified: Secondary | ICD-10-CM | POA: Diagnosis not present

## 2024-02-15 DIAGNOSIS — Z79890 Hormone replacement therapy (postmenopausal): Secondary | ICD-10-CM | POA: Insufficient documentation

## 2024-02-15 DIAGNOSIS — M48061 Spinal stenosis, lumbar region without neurogenic claudication: Secondary | ICD-10-CM | POA: Diagnosis not present

## 2024-02-15 DIAGNOSIS — Z86718 Personal history of other venous thrombosis and embolism: Secondary | ICD-10-CM | POA: Diagnosis not present

## 2024-02-15 DIAGNOSIS — K5730 Diverticulosis of large intestine without perforation or abscess without bleeding: Secondary | ICD-10-CM | POA: Insufficient documentation

## 2024-02-15 DIAGNOSIS — D539 Nutritional anemia, unspecified: Secondary | ICD-10-CM

## 2024-02-15 DIAGNOSIS — S46919A Strain of unspecified muscle, fascia and tendon at shoulder and upper arm level, unspecified arm, initial encounter: Secondary | ICD-10-CM | POA: Insufficient documentation

## 2024-02-15 DIAGNOSIS — I110 Hypertensive heart disease with heart failure: Secondary | ICD-10-CM | POA: Diagnosis not present

## 2024-02-15 DIAGNOSIS — R5383 Other fatigue: Secondary | ICD-10-CM | POA: Insufficient documentation

## 2024-02-15 DIAGNOSIS — Z5111 Encounter for antineoplastic chemotherapy: Secondary | ICD-10-CM | POA: Diagnosis present

## 2024-02-15 DIAGNOSIS — Z801 Family history of malignant neoplasm of trachea, bronchus and lung: Secondary | ICD-10-CM | POA: Insufficient documentation

## 2024-02-15 DIAGNOSIS — J9690 Respiratory failure, unspecified, unspecified whether with hypoxia or hypercapnia: Secondary | ICD-10-CM | POA: Diagnosis not present

## 2024-02-15 DIAGNOSIS — Z8673 Personal history of transient ischemic attack (TIA), and cerebral infarction without residual deficits: Secondary | ICD-10-CM | POA: Insufficient documentation

## 2024-02-15 DIAGNOSIS — M75122 Complete rotator cuff tear or rupture of left shoulder, not specified as traumatic: Secondary | ICD-10-CM | POA: Diagnosis not present

## 2024-02-15 DIAGNOSIS — E039 Hypothyroidism, unspecified: Secondary | ICD-10-CM | POA: Insufficient documentation

## 2024-02-15 DIAGNOSIS — Z803 Family history of malignant neoplasm of breast: Secondary | ICD-10-CM | POA: Insufficient documentation

## 2024-02-15 DIAGNOSIS — M47816 Spondylosis without myelopathy or radiculopathy, lumbar region: Secondary | ICD-10-CM | POA: Diagnosis not present

## 2024-02-15 DIAGNOSIS — D7589 Other specified diseases of blood and blood-forming organs: Secondary | ICD-10-CM | POA: Diagnosis not present

## 2024-02-15 DIAGNOSIS — Z823 Family history of stroke: Secondary | ICD-10-CM | POA: Insufficient documentation

## 2024-02-15 DIAGNOSIS — Z7982 Long term (current) use of aspirin: Secondary | ICD-10-CM | POA: Diagnosis not present

## 2024-02-15 DIAGNOSIS — R531 Weakness: Secondary | ICD-10-CM | POA: Diagnosis not present

## 2024-02-15 DIAGNOSIS — Z8249 Family history of ischemic heart disease and other diseases of the circulatory system: Secondary | ICD-10-CM | POA: Insufficient documentation

## 2024-02-15 DIAGNOSIS — C61 Malignant neoplasm of prostate: Secondary | ICD-10-CM | POA: Insufficient documentation

## 2024-02-15 DIAGNOSIS — Z813 Family history of other psychoactive substance abuse and dependence: Secondary | ICD-10-CM | POA: Insufficient documentation

## 2024-02-15 DIAGNOSIS — Z79818 Long term (current) use of other agents affecting estrogen receptors and estrogen levels: Secondary | ICD-10-CM | POA: Diagnosis not present

## 2024-02-15 DIAGNOSIS — C7940 Secondary malignant neoplasm of unspecified part of nervous system: Secondary | ICD-10-CM

## 2024-02-15 DIAGNOSIS — Z79899 Other long term (current) drug therapy: Secondary | ICD-10-CM | POA: Insufficient documentation

## 2024-02-15 DIAGNOSIS — Z9089 Acquired absence of other organs: Secondary | ICD-10-CM | POA: Insufficient documentation

## 2024-02-15 DIAGNOSIS — M75110 Incomplete rotator cuff tear or rupture of unspecified shoulder, not specified as traumatic: Secondary | ICD-10-CM | POA: Insufficient documentation

## 2024-02-15 DIAGNOSIS — Z8701 Personal history of pneumonia (recurrent): Secondary | ICD-10-CM | POA: Insufficient documentation

## 2024-02-15 DIAGNOSIS — M4802 Spinal stenosis, cervical region: Secondary | ICD-10-CM | POA: Insufficient documentation

## 2024-02-15 DIAGNOSIS — C7951 Secondary malignant neoplasm of bone: Secondary | ICD-10-CM | POA: Insufficient documentation

## 2024-02-15 DIAGNOSIS — K219 Gastro-esophageal reflux disease without esophagitis: Secondary | ICD-10-CM | POA: Diagnosis not present

## 2024-02-15 DIAGNOSIS — I700 Atherosclerosis of aorta: Secondary | ICD-10-CM | POA: Insufficient documentation

## 2024-02-15 DIAGNOSIS — Z87442 Personal history of urinary calculi: Secondary | ICD-10-CM | POA: Insufficient documentation

## 2024-02-15 LAB — CMP (CANCER CENTER ONLY)
ALT: 31 U/L (ref 0–44)
AST: 31 U/L (ref 15–41)
Albumin: 4 g/dL (ref 3.5–5.0)
Alkaline Phosphatase: 101 U/L (ref 38–126)
Anion gap: 8 (ref 5–15)
BUN: 8 mg/dL (ref 8–23)
CO2: 29 mmol/L (ref 22–32)
Calcium: 9.1 mg/dL (ref 8.9–10.3)
Chloride: 99 mmol/L (ref 98–111)
Creatinine: 0.5 mg/dL — ABNORMAL LOW (ref 0.61–1.24)
GFR, Estimated: >60 mL/min
Glucose, Bld: 85 mg/dL (ref 70–99)
Potassium: 4.1 mmol/L (ref 3.5–5.1)
Sodium: 136 mmol/L (ref 135–145)
Total Bilirubin: 0.9 mg/dL (ref 0.0–1.2)
Total Protein: 6.4 g/dL — ABNORMAL LOW (ref 6.5–8.1)

## 2024-02-15 LAB — CBC WITH DIFFERENTIAL (CANCER CENTER ONLY)
Abs Immature Granulocytes: 0.02 K/uL (ref 0.00–0.07)
Basophils Absolute: 0 K/uL (ref 0.0–0.1)
Basophils Relative: 1 %
Eosinophils Absolute: 0.1 K/uL (ref 0.0–0.5)
Eosinophils Relative: 3 %
HCT: 36 % — ABNORMAL LOW (ref 39.0–52.0)
Hemoglobin: 12.1 g/dL — ABNORMAL LOW (ref 13.0–17.0)
Immature Granulocytes: 1 %
Lymphocytes Relative: 17 %
Lymphs Abs: 0.7 K/uL (ref 0.7–4.0)
MCH: 34.1 pg — ABNORMAL HIGH (ref 26.0–34.0)
MCHC: 33.6 g/dL (ref 30.0–36.0)
MCV: 101.4 fL — ABNORMAL HIGH (ref 80.0–100.0)
Monocytes Absolute: 0.4 K/uL (ref 0.1–1.0)
Monocytes Relative: 9 %
Neutro Abs: 3 K/uL (ref 1.7–7.7)
Neutrophils Relative %: 69 %
Platelet Count: 173 K/uL (ref 150–400)
RBC: 3.55 MIL/uL — ABNORMAL LOW (ref 4.22–5.81)
RDW: 13.2 % (ref 11.5–15.5)
WBC Count: 4.3 K/uL (ref 4.0–10.5)
nRBC: 0 % (ref 0.0–0.2)

## 2024-02-15 LAB — PSA: Prostatic Specific Antigen: 36.1 ng/mL — ABNORMAL HIGH (ref 0.00–4.00)

## 2024-02-15 MED ORDER — LEUPROLIDE ACETATE (6 MONTH) 45 MG ~~LOC~~ KIT
45.0000 mg | PACK | Freq: Once | SUBCUTANEOUS | Status: AC
  Administered 2024-02-15: 45 mg via SUBCUTANEOUS
  Filled 2024-02-15: qty 45

## 2024-02-15 NOTE — Progress Notes (Signed)
 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 Currently on chemoprevention therapy with Eligard .  Status post palliative radiation to spine. PMSA PET scan showed partial response. Previously on Xtandi  160mg  daily, switched to Darolutamide  600mg  BID due to cardiology issues/drug interactions.   Disease progression on PSMA - increased hyper metabolic activities and previous bone lesions.  No new bone lesions. Labs are reviewed and discussed with patient.  Continue Darolutamide  600mg  BID   Continue follow up with NM and finish planned Pluvicto  x 6   Encounter for monitoring androgen deprivation therapy On Eligard  45mg  Q6 months. Today, and next dose due March 2026  History of deep vein thrombosis (DVT) of lower extremity Provoked, continue Aspirin  162 mg daily for prophylaxis.  Macrocytic anemia Slightly decreased hemoglobin, macrocytosis.  Adequate B12 and folate levels.  Monitor.     Orders Placed This Encounter  Procedures   CBC with Differential (Cancer Center Only)    Standing Status:   Future    Expected Date:   04/04/2024    Expiration Date:   07/03/2024   CMP (Cancer Center only)    Standing Status:   Future    Expected Date:   04/04/2024    Expiration Date:   07/03/2024   PSA    Standing Status:   Future    Expected Date:   04/04/2024    Expiration Date:   07/03/2024   Follow up in Feb 2026  All questions were answered. The patient knows to call the clinic with any problems, questions  or concerns.  Call Babara, MD, PhD [facility] Hematology Oncology 02/15/2024        HISTORY OF PRESENTING ILLNESS:  Jason [name] 68 y.o. male presents to establish care for metastatic prostate cancer I have reviewed his chart and materials related to his cancer extensively and collaborated history with the patient. 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  Associated epidural disease greater at the C6 where there is marked canal stenosis without cord compression.  Left greater than right foraminal effacement due to the extraosseous disease.   10/03/2021 Imaging   MRI left shoulder showed High-riding humeral head with massive full-thickness tear of the entire supraspinatus tendon and the anterior 50% of the infraspinatus tendon. Additional partial-thickness articular sided tearing of the posterior infraspinatus. Moderate to high-grade supraspinatus and infraspinatus muscle atrophy suggests these tears  are chronic. Partial-thickness tearing of the superior greater than inferior  aspects of the subscapularis tendon footprint. Mild subscapularis muscle atrophy.  Moderate degenerative changes of the acromioclavicular joint. Moderate glenohumeral cartilage degenerative changes.   10/05/2021 Imaging   CT chest abdomen pelvis with contrast showed  Moderate severity retroperitoneal and pelvic lymphadenopathy,consistent with metastatic disease. Small lytic areas at the levels of L4 and S1, with diffusely sclerotic changes involving the second and fourth right ribs, sacrum and left iliac bone. These  findings are likely consistent with osseous metastasis. 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  Sigmoid diverticulosis. Aortic atherosclerosis   10/05/2021 Imaging   CT cervical spine without contrast Extensive heterogeneous sclerotic lesions throughout the cervical spine compatible with metastatic disease to the bone.  Mixed sclerotic and lytic changes in the posterior elements at C5 and C6.Extraosseous tumor at C5 and C6 is better appreciated on the MRI scan.   10/05/2021 Tumor Marker   PSA >1500   10/06/2021 Cancer Staging   Staging form: Prostate, AJCC 8th Edition - Clinical stage from 10/06/2021: Stage IVB (cN1, pM1c, PSA: 1500) - Signed by Babara Call, MD on 12/04/2021 Stage prefix: Initial diagnosis Prostate specific antigen (PSA) range: 20 or greater   10/08/2021 Imaging   MRI thoracic spine and the lumbar spine with and without contrast 1. 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 2. 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 3. Additional epidural tumor along the posterior endplates at T9,T11, and T12 without high-grade spinal canal stenosis or cord compression. 4. Extensive epidural tumor in the lumbar spine is greatest in thickness at L1 without high-grade spinal canal stenosis at this level; however, superimposed on pre-existing degenerative changes lower in the lumbar spine results in severe spinal canal stenosis with cauda equina nerve root compression at L3-L4 and L4-L5. Multilevel neural foraminal stenosis is detailed above. 5. Postsurgical changes reflecting C6 laminectomy unchanged spinal canal stenosis compared to the postoperative study from 1 day prior.No definite cord signal abnormality. 6. Extramedullary fluid collection along the dorsal aspect of the cord beginning at T9-T10 extending into the lumbar spine  is likely subdural in location but is of uncertain etiology; evolving blood products not excluded. The collection anteriorly displaces the cord and cauda equina nerve roots without frank cord compression or signal abnormality. There is no peripheral enhancement to suggest abscess. 7. Prevertebral edema in the cervical spine which may be postsurgical in nature. 8. New bilateral lower lobe consolidations may reflect atelectasis or developing pneumonia/aspiration.   10/08/2021 Imaging   MRI brain with and without contrast showed 1. 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 There is intracranial extension of tumor along the right frontal convexity measuring up to 5 mm in thickness without mass effect on the underlying brain parenchyma or midline shift. Extracranial extension of tumor in this location measures up to approximately 3 mm. 2. Additional extracranial extension of tumor into the scalp along the vertex measuring up to 7 mm in thickness. 3. Small focus of diffusion restriction in the right parietal cortex most in keeping with a small acute infarct. 4. No evidence of parenchymal metastatic disease.   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 10/06/2021, patient underwent C6 cervical laminectomy and resection of tumor.  Pathology came back positive for metastatic prostate cancer. 8/14 developed significant bilateral upper and lower extremity weakness with hypotension requiring transfer to the ICU, pressors, steroid, respiratory failure on mechanical ventilation. Later patient was stabilized and extubated.  8/16 Firmagon  loading dose 240mg   9/15 Eligard  22.5mg  given inpatient during his rehab admission.     10/2021 -  Radiation Therapy   Palliative radiation  to spine.    12/04/2021 Tumor Marker   PSA 67.09   01/13/2022 Tumor Marker    PSA 43.95   01/23/2022 -  Chemotherapy   Started on Xtandi  160mg  daily.    12/14/2023 Imaging   PSMA PET scan 1. Multifocal PSMA-avid skeletal metastases with increased radiotracer intensity in multiple lesions compared to prior, without definite new metastatic sites. 2. No evidence of recurrence in the prostatectomy bed. 3. No PSMA-avid pelvic or retroperitoneal lymph nodes.      INTERVAL HISTORY Jason [name] is a 68 y.o. male who has above history reviewed by me today presents for follow up visit for metastatic prostate cancer. Patient was accompanied by wife.  Patient has chronic bilateral lower extremity extremity weakness, generalized left upper extremity weakness.  [name]  He follows up with neurology.He continues physical therapy and has made some progress.  + fatigued, brain fog, fatigue, unchanged.  S/p one dose of pleuvicto. Overall he tolerates well.  He reports fatigue and a few episodes of loose BM.    MEDICAL HISTORY:  Past Medical History:  Diagnosis Date   Allergy    Arthritis    osteoarthritis   Cancer (HCC)    prostate   CHF (congestive heart failure) (HCC)    GERD (gastroesophageal reflux disease)    History of kidney stones    passed   Hypertension    Hypothyroidism    Palpitations 2022   Paralysis (HCC)    paraplegia 09/2021-surgery or stroke   Pneumonia    12/22/28- > 25 years ago   Stroke [facility])    Mini stroke - Aug 2023   Thyroid  disease    Hyperthyroidism s/p radioactive iodine ablation    SURGICAL HISTORY: Past Surgical History:  Procedure Laterality Date   CARDIAC CATHETERIZATION  03/2000   HERNIA REPAIR  1961   umbilical   IR CATHETER TUBE CHANGE  12/12/2022   IR US  GUIDE BX ASP/DRAIN  11/11/2022   LEFT HEART CATH AND CORONARY ANGIOGRAPHY Left 03/20/2020   Procedure: LEFT HEART CATH AND CORONARY ANGIOGRAPHY;  Surgeon: Mady Bruckner, MD;  Location: [facility] INVASIVE CV LAB;  Service: Cardiovascular;  Laterality: Left;   POSTERIOR  CERVICAL FUSION/FORAMINOTOMY N/A 10/06/2021   Procedure: POSTERIOR CERVICAL FUSION/ FORAMINOTOMY LEVEL 3;  Surgeon: Clois Fret, MD;  Location: [facility] ORS;  Service: Neurosurgery;  Laterality: N/A;  will need monitoring   RADIOLOGY WITH ANESTHESIA Left 04/18/2021   Procedure: MRI SHOULDER WITHOUT CONTRAST WITH ANESTHESIA;  Surgeon: Radiologist, Medication, MD;  Location: [facility] OR;  Service: Radiology;  Laterality: Left;   RADIOLOGY WITH ANESTHESIA N/A 10/03/2021   Procedure: MRI CERVICAL SPINE WITH ANESTHESIA;  Surgeon: Radiologist, Medication, MD;  Location: [facility] OR;  Service: Radiology;  Laterality: N/A;   RADIOLOGY WITH ANESTHESIA N/A 01/30/2022   Procedure: MRI CERVICAL SPINE WITH AND WITOHOUT CONTRAST WITH ANESTHESIA;  Surgeon: Radiologist, Medication, MD;  Location: [facility] OR;  Service: Radiology;  Laterality: N/A;   RADIOLOGY WITH ANESTHESIA N/A 05/22/2022   Procedure: MRI LUMBER SPINE AND CERVIC SPINE WITH AND WITHOUT CONTRAST;  Surgeon: Radiologist, Medication, MD;  Location: [facility] OR;  Service: Radiology;  Laterality: N/A;   RADIOLOGY WITH ANESTHESIA N/A 12/25/2022   Procedure: MRI WITH ANESTHESIA OF CERVICAL SPINE WITH AND WITHOUT CONTRAST,CARDIAC MORPHOLOGY;  Surgeon: Radiologist, Medication, MD;  Location: [facility] OR;  Service: Radiology;  Laterality: N/A;   RADIOLOGY WITH ANESTHESIA N/A 12/31/2023   Procedure: MRI CERVICAL SPINE WITH AND WITHOUT CONTRAST WITH ANESTHESIA;  Surgeon: Radiologist, Medication, MD;  Location: [facility]  OR;  Service: Radiology;  Laterality: N/A;   REVERSE SHOULDER ARTHROPLASTY Left    ROTATOR CUFF REPAIR  11/2010   Dr Cleotilde   SUPRAPUBIC CATHETER INSERTION  11/11/2022   TONSILLECTOMY     TOTAL HIP ARTHROPLASTY Right 02/24/2009   TOTAL HIP ARTHROPLASTY Left 06/2016   Dr Leora    SOCIAL HISTORY: Social History   Socioeconomic History   Marital status: Married    Spouse name: [name]    Number of children: 0   Years of education: Not on file   Highest education  level: Bachelor's degree (e.g., BA, AB, BS)  Occupational History   Occupation: Warden/ranger: [employer]  Tobacco Use   Smoking status: Never    Passive exposure: Never   Smokeless tobacco: Never  Vaping Use   Vaping status: Never Used  Substance and Sexual Activity   Alcohol use: Not Currently   Drug use: No   Sexual activity: Not Currently  Other Topics Concern   Not on file  Social History Narrative   Lives at home with spouse.    Social Drivers of Health   Tobacco Use: Low Risk (02/15/2024)   Patient History    Smoking Tobacco Use: Never    Smokeless Tobacco Use: Never    Passive Exposure: Never  Financial Resource Strain: Low Risk (08/18/2023)   Overall Financial Resource Strain (CARDIA)    Difficulty of Paying Living Expenses: Not hard at all  Food Insecurity: No Food Insecurity (08/18/2023)   Epic    Worried About Programme Researcher, Broadcasting/film/video in the Last Year: Never true    Ran Out of Food in the Last Year: Never true  Transportation Needs: No Transportation Needs (08/18/2023)   Epic    Lack of Transportation (Medical): No    Lack of Transportation (Non-Medical): No  Physical Activity: Inactive (08/18/2023)   Exercise Vital Sign    Days of Exercise per Week: 0 days    Minutes of Exercise per Session: Not on file  Stress: No Stress Concern Present (08/18/2023)   [employer] of Occupational Health - Occupational Stress Questionnaire    Feeling of Stress: Only a little  Social Connections: Moderately Isolated (08/18/2023)   Social Connection and Isolation Panel    Frequency of Communication with Friends and Family: More than three times a week    Frequency of Social Gatherings with Friends and Family: More than three times a week    Attends Religious Services: Never    Database Administrator or Organizations: No    Attends Engineer, Structural: Not on file    Marital Status: Married  Catering Manager Violence: Not on file  Depression (PHQ2-9):  Low Risk (01/08/2024)   Depression (PHQ2-9)    PHQ-2 Score: 0  Alcohol Screen: Not on file  Housing: Unknown (08/18/2023)   Epic    Unable to Pay for Housing in the Last Year: No    Number of Times Moved in the Last Year: Not on file    Homeless in the Last Year: No  Utilities: Not on file  Health Literacy: Not on file    FAMILY HISTORY: Family History  Problem Relation Age of Onset   Hypertension Mother    Stroke Mother    Gout Father    Heart attack Father 42   Heart disease Sister    Lung cancer Sister    Drug abuse Sister    Breast cancer Paternal Aunt    Lung cancer  Maternal Grandmother    Colon cancer Neg Hx    Esophageal cancer Neg Hx    Rectal cancer Neg Hx    Stomach cancer Neg Hx     ALLERGIES:  has no known allergies.  MEDICATIONS:  Current Outpatient Medications  Medication Sig Dispense Refill   acetaminophen  (TYLENOL ) 500 MG tablet Take 1,000 mg by mouth 2 (two) times daily.     ascorbic acid  (VITAMIN C ) 500 MG tablet Take 1 tablet (500 mg total) by mouth daily. 30 tablet 0   aspirin  EC 81 MG tablet Take 1 tablet (81 mg total) by mouth daily. Swallow whole. 30 tablet 12   atorvastatin  (LIPITOR) 40 MG tablet Take 1 tablet (40 mg total) by mouth daily. 90 tablet 3   baclofen  (LIORESAL ) 10 MG tablet Take 0.5-1 tablets (5-10 mg total) by mouth 2 (two) times daily as needed for muscle spasms. (Patient taking differently: Take 10 mg by mouth daily.) 60 each 5   cetirizine (ZYRTEC) 10 MG tablet Take 10 mg by mouth at bedtime.     clonazePAM  (KLONOPIN ) 0.5 MG tablet Take 1 tablet (0.5 mg total) by mouth 2 (two) times daily. 60 tablet 0   CRANBERRY PO Take 4,200 mg by mouth 2 (two) times daily.     cyanocobalamin  (VITAMIN B12) 1000 MCG tablet Take 1,000 mcg by mouth daily.     darolutamide  (NUBEQA ) 300 MG tablet Take 2 tablets (600 mg total) by mouth 2 (two) times daily with a meal. 120 tablet 3   famotidine  (PEPCID ) 10 MG tablet Take 10 mg by mouth as needed.      FLUoxetine  (PROZAC ) 20 MG capsule Take 1 capsule (20 mg total) by mouth at bedtime. 90 capsule 3   fluticasone  (FLONASE ) 50 MCG/ACT nasal spray Place 1 spray into both nostrils daily. 48 g 3   guaiFENesin  (MUCINEX ) 600 MG 12 hr tablet Take 300 mg by mouth 2 (two) times daily.     ketoconazole  (NIZORAL ) 2 % cream Apply topically to rash 1-2 times daily for 2-4 weeks. 30 g 1   levothyroxine  (SYNTHROID ) 88 MCG tablet Take 1 tablet (88 mcg total) by mouth daily. 90 tablet 3   metoprolol  succinate (TOPROL -XL) 25 MG 24 hr tablet Take 1 tablet (25 mg total) by mouth at bedtime. Take with or immediately following a meal. 90 tablet 3   Multiple Vitamin (MULTIVITAMIN) tablet Take 1 tablet by mouth daily.     potassium chloride  SA (KLOR-CON  M) 20 MEQ tablet Take 1 tablet (20 mEq total) by mouth at bedtime. 90 tablet 3   sacubitril -valsartan  (ENTRESTO ) 24-26 MG Take 1 tablet by mouth 2 (two) times daily. 180 tablet 3   No current facility-administered medications for this visit.    Review of Systems  Constitutional:  Positive for fatigue. Negative for appetite change and chills.  HENT:   Negative for hearing loss.   Eyes:  Negative for icterus.  Respiratory:  Negative for cough and shortness of breath.   Cardiovascular:  Negative for chest pain.  Gastrointestinal:  Negative for abdominal pain and blood in stool.  Genitourinary:         + Foley catheter  Musculoskeletal:  Negative for back pain.  Skin:  Negative for rash.  Neurological:  Positive for extremity weakness.     PHYSICAL EXAMINATION: ECOG PERFORMANCE STATUS: 3 - Symptomatic, >50% confined to bed  Vitals:   02/15/24 1019  BP: 117/68  Pulse: (!) 54  Resp: 18  Temp: (!) 97.3 F (  36.3 C)  SpO2: 97%   Filed Weights   02/15/24 1019  Weight: 155 lb (70.3 kg)        Physical Exam Constitutional:      General: He is not in acute distress.    Appearance: He is not diaphoretic.     Comments: Patient sits in wheelchair.   HENT:     Head: Normocephalic and atraumatic.     Nose: Nose normal.     Mouth/Throat:     Pharynx: No oropharyngeal exudate.  Eyes:     General: No scleral icterus. Cardiovascular:     Rate and Rhythm: Normal rate.  Pulmonary:     Effort: Pulmonary effort is normal. No respiratory distress.  Abdominal:     General: There is no distension.  Musculoskeletal:        General: Normal range of motion.     Cervical back: Normal range of motion.  Skin:    General: Skin is warm and dry.     Findings: No erythema.  Neurological:     Mental Status: He is alert and oriented to person, place, and time.     Cranial Nerves: No cranial nerve deficit.     Motor: No abnormal muscle tone.     Comments: Quadriplegia  Psychiatric:        Mood and Affect: Mood and affect normal.      LABORATORY DATA:  I have reviewed the data as listed    Latest Ref Rng & Units 02/15/2024    9:55 AM 01/14/2024   11:30 AM 11/13/2023   11:40 AM  CBC  WBC 4.0 - 10.5 K/uL 4.3  5.4  7.1   Hemoglobin 13.0 - 17.0 g/dL 87.8  88.1  88.0   Hematocrit 39.0 - 52.0 % 36.0  35.5  35.9   Platelets 150 - 400 K/uL 173  272  221       Latest Ref Rng & Units 02/15/2024    9:55 AM 01/14/2024   11:30 AM 11/13/2023   11:41 AM  CMP  Glucose 70 - 99 mg/dL 85  87  86   BUN 8 - 23 mg/dL 8  8  11    Creatinine 0.61 - 1.24 mg/dL 9.49  9.42  9.51   Sodium 135 - 145 mmol/L 136  135  133   Potassium 3.5 - 5.1 mmol/L 4.1  4.1  4.0   Chloride 98 - 111 mmol/L 99  99  100   CO2 22 - 32 mmol/L 29  28  28    Calcium  8.9 - 10.3 mg/dL 9.1  9.1  8.5   Total Protein 6.5 - 8.1 g/dL 6.4  6.7  6.2   Total Bilirubin 0.0 - 1.2 mg/dL 0.9  0.8  1.1   Alkaline Phos 38 - 126 U/L 101  123  102   AST 15 - 41 U/L 31  37  32   ALT 0 - 44 U/L 31  35  35      RADIOGRAPHIC STUDIES: I have personally reviewed the radiological images as listed and agreed with the findings in the report. No results found.  "

## 2024-02-15 NOTE — Assessment & Plan Note (Signed)
 Provoked, continue Aspirin 162 mg daily for prophylaxis.

## 2024-02-15 NOTE — Progress Notes (Signed)
 Patient is doing ok. No new questions for the doctor today.

## 2024-02-15 NOTE — Assessment & Plan Note (Signed)
 On Eligard  45mg  Q6 months. Today, and next dose due March 2026

## 2024-02-15 NOTE — Assessment & Plan Note (Signed)
 Metastatic castration sensitive prostate cancer, with CNS involvement. Currently on chemoprevention therapy with Eligard .  Status post palliative radiation to spine. PMSA PET scan showed partial response. Previously on Xtandi  160mg  daily, switched to Darolutamide  600mg  BID due to cardiology issues/drug interactions.   Disease progression on PSMA - increased hyper metabolic activities and previous bone lesions.  No new bone lesions. Labs are reviewed and discussed with patient.  Continue Darolutamide  600mg  BID   Continue follow up with NM and finish planned Pluvicto  x 6

## 2024-02-15 NOTE — Assessment & Plan Note (Signed)
 Slightly decreased hemoglobin, macrocytosis.  Adequate B12 and folate levels.  Monitor.

## 2024-02-26 ENCOUNTER — Encounter: Payer: Self-pay | Admitting: Physical Medicine and Rehabilitation

## 2024-02-26 ENCOUNTER — Encounter: Attending: Physical Medicine and Rehabilitation | Admitting: Physical Medicine and Rehabilitation

## 2024-02-26 VITALS — BP 123/75 | HR 60 | Ht 68.0 in

## 2024-02-26 DIAGNOSIS — K592 Neurogenic bowel, not elsewhere classified: Secondary | ICD-10-CM | POA: Diagnosis not present

## 2024-02-26 DIAGNOSIS — G8250 Quadriplegia, unspecified: Secondary | ICD-10-CM | POA: Diagnosis not present

## 2024-02-26 DIAGNOSIS — Z9359 Other cystostomy status: Secondary | ICD-10-CM | POA: Insufficient documentation

## 2024-02-26 DIAGNOSIS — R252 Cramp and spasm: Secondary | ICD-10-CM | POA: Diagnosis not present

## 2024-02-26 DIAGNOSIS — N319 Neuromuscular dysfunction of bladder, unspecified: Secondary | ICD-10-CM | POA: Diagnosis not present

## 2024-02-26 DIAGNOSIS — Z993 Dependence on wheelchair: Secondary | ICD-10-CM | POA: Diagnosis not present

## 2024-02-26 NOTE — Patient Instructions (Signed)
 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   2. Let's add Senna M/W/F- and see if that helps his feeling- of feeling constipated.   3. Mucus is due to suppositories- is fine- sounds like not interested in magic bullet.    4. We discussed his fear of dying- I thinks it's the unknown that's so fear causing. Try to live your best life- and watch a lot of hockey and eat foods you love, etc   5.   F/U in 3 months double appt- SCI

## 2024-02-26 NOTE — Progress Notes (Signed)
 "  Subjective:    Patient ID: Jason [name], male    DOB: 05/08/1955, 69 y.o.   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     Started Cluvecto- first tx in November 2025- and next one is 03/01/24.   Coming back to [place]- at [employer].   - scheduled 3 treatments already and then will do blood work before every treatment-  no MRI/PET scan scheduled right now.   R buttock/R side of crack- healing well Calmoseptine-  a patch BID and barrier cream- -  Working well.  No broken skin- just a little pink.    Spasticity- still- occ in AM as well- doesn't want to take baclofen  in AM- only night time-  2x/ overnight that took an additional 5 mg baclofen - and 10 mg Baclofen  at bedtime normally.    Still working with PT-  at sink-  Now going up and down- sit to stand and back down to sit- he's there to make sure he doesn't fall- - can do on his own- is supervision.  3 sets of 10 but might be tired and does set of 8 the last time.  Then stands for 2 minutes (last time did 4 sets of 8-10 last PT session).  Tries to stand daily with wife.   Still frustrates him that not getting enough better.  Told his goals are too high. To walk.     Feels cramped/ like is constipated.  Bowel program- is doing OK.  Hasn't been taking laxative/Senna pill- at least 6 months-   Not sure if due to having more feeling in bowel, but feels like needs to have BM, but hard to come out- like feels blocked.    Feels somewhat constipated- not getting everything out.  Before bowel program, wouldn't feel have BM- always-  now sometimes is feeling it, when he goes.   Still doing Bowel program with dig stim-  at 7:30-8pm-  Every 2 weeks has BM in middle of night.   Not everything comes out.  2nd period of hockey game.  More feeling in bowel-  Gumminess in poop- mucus-y-    Not doing Audiobooks Napping still a lot during day after does HEP.   Listening to music- doing something he likes.   Scared of dying,  We discussed this.    Pain Inventory Average Pain 2 Pain Right Now 0 My pain is aching  In the last 24 hours, has pain interfered with the following? General activity 0 Relation with others 0 Enjoyment of life 0 What TIME of day is your pain at its worst? night Sleep (in general) Fair  Pain is worse with: some activites Pain improves with: medication Relief from Meds: na  Family History  Problem Relation Age of Onset   Hypertension Mother    Stroke Mother    Gout Father    Heart attack Father 48   Heart disease Sister    Lung cancer Sister    Drug abuse Sister    Breast cancer Paternal Aunt    Lung cancer Maternal Grandmother    Colon cancer Neg Hx    Esophageal cancer Neg Hx    Rectal cancer Neg Hx    Stomach cancer Neg Hx  Social History   Socioeconomic History   Marital status: Married    Spouse name: [name]    Number of children: 0   Years of education: Not on file   Highest education level: Bachelor's degree (e.g., BA, AB, BS)  Occupational History   Occupation: Warden/ranger: [employer]  Tobacco Use   Smoking status: Never    Passive exposure: Never   Smokeless tobacco: Never  Vaping Use   Vaping status: Never Used  Substance and Sexual Activity   Alcohol use: Not Currently   Drug use: No   Sexual activity: Not Currently  Other Topics Concern   Not on file  Social History Narrative   Lives at home with spouse.    Social Drivers of Health   Tobacco Use: Low Risk (02/26/2024)   Patient History    Smoking Tobacco Use: Never    Smokeless Tobacco Use: Never    Passive Exposure: Never  Financial Resource Strain: Low Risk (08/18/2023)   Overall Financial Resource Strain  (CARDIA)    Difficulty of Paying Living Expenses: Not hard at all  Food Insecurity: No Food Insecurity (08/18/2023)   Epic    Worried About Programme Researcher, Broadcasting/film/video in the Last Year: Never true    Ran Out of Food in the Last Year: Never true  Transportation Needs: No Transportation Needs (08/18/2023)   Epic    Lack of Transportation (Medical): No    Lack of Transportation (Non-Medical): No  Physical Activity: Inactive (08/18/2023)   Exercise Vital Sign    Days of Exercise per Week: 0 days    Minutes of Exercise per Session: Not on file  Stress: No Stress Concern Present (08/18/2023)   [employer] of Occupational Health - Occupational Stress Questionnaire    Feeling of Stress: Only a little  Social Connections: Moderately Isolated (08/18/2023)   Social Connection and Isolation Panel    Frequency of Communication with Friends and Family: More than three times a week    Frequency of Social Gatherings with Friends and Family: More than three times a week    Attends Religious Services: Never    Database Administrator or Organizations: No    Attends Engineer, Structural: Not on file    Marital Status: Married  Depression (PHQ2-9): Low Risk (02/26/2024)   Depression (PHQ2-9)    PHQ-2 Score: 0  Alcohol Screen: Not on file  Housing: Unknown (08/18/2023)   Epic    Unable to Pay for Housing in the Last Year: No    Number of Times Moved in the Last Year: Not on file    Homeless in the Last Year: No  Utilities: Not on file  Health Literacy: Not on file   Past Surgical History:  Procedure Laterality Date   CARDIAC CATHETERIZATION  03/2000   HERNIA REPAIR  1961   umbilical   IR CATHETER TUBE CHANGE  12/12/2022   IR US  GUIDE BX ASP/DRAIN  11/11/2022   LEFT HEART CATH AND CORONARY ANGIOGRAPHY Left 03/20/2020   Procedure: LEFT HEART CATH AND CORONARY ANGIOGRAPHY;  Surgeon: Mady Bruckner, MD;  Location: [facility];  Service: Cardiovascular;  Laterality: Left;   POSTERIOR  CERVICAL FUSION/FORAMINOTOMY N/A 10/06/2021   Procedure: POSTERIOR CERVICAL FUSION/ FORAMINOTOMY LEVEL 3;  Surgeon: Clois Fret, MD;  Location: [facility];  Service: Neurosurgery;  Laterality: N/A;  will need monitoring   RADIOLOGY WITH ANESTHESIA Left 04/18/2021   Procedure: MRI SHOULDER WITHOUT CONTRAST WITH ANESTHESIA;  Surgeon:  Radiologist, Medication, MD;  Location: [facility];  Service: Radiology;  Laterality: Left;   RADIOLOGY WITH ANESTHESIA N/A 10/03/2021   Procedure: MRI CERVICAL SPINE WITH ANESTHESIA;  Surgeon: Radiologist, Medication, MD;  Location: [facility];  Service: Radiology;  Laterality: N/A;   RADIOLOGY WITH ANESTHESIA N/A 01/30/2022   Procedure: MRI CERVICAL SPINE WITH AND WITOHOUT CONTRAST WITH ANESTHESIA;  Surgeon: Radiologist, Medication, MD;  Location: [facility];  Service: Radiology;  Laterality: N/A;   RADIOLOGY WITH ANESTHESIA N/A 05/22/2022   Procedure: MRI LUMBER SPINE AND CERVIC SPINE WITH AND WITHOUT CONTRAST;  Surgeon: Radiologist, Medication, MD;  Location: [facility];  Service: Radiology;  Laterality: N/A;   RADIOLOGY WITH ANESTHESIA N/A 12/25/2022   Procedure: MRI WITH ANESTHESIA OF CERVICAL SPINE WITH AND WITHOUT CONTRAST,CARDIAC MORPHOLOGY;  Surgeon: Radiologist, Medication, MD;  Location: [facility];  Service: Radiology;  Laterality: N/A;   RADIOLOGY WITH ANESTHESIA N/A 12/31/2023   Procedure: MRI CERVICAL SPINE WITH AND WITHOUT CONTRAST WITH ANESTHESIA;  Surgeon: Radiologist, Medication, MD;  Location: [facility];  Service: Radiology;  Laterality: N/A;   REVERSE SHOULDER ARTHROPLASTY Left    ROTATOR CUFF REPAIR  11/2010   Dr Cleotilde   SUPRAPUBIC CATHETER INSERTION  11/11/2022   TONSILLECTOMY     TOTAL HIP ARTHROPLASTY Right 02/24/2009   TOTAL HIP ARTHROPLASTY Left 06/2016   Dr Leora   Past Surgical History:  Procedure Laterality Date   CARDIAC CATHETERIZATION  03/2000   HERNIA REPAIR  1961   umbilical   IR CATHETER TUBE CHANGE  12/12/2022   IR US  GUIDE BX ASP/DRAIN   11/11/2022   LEFT HEART CATH AND CORONARY ANGIOGRAPHY Left 03/20/2020   Procedure: LEFT HEART CATH AND CORONARY ANGIOGRAPHY;  Surgeon: Mady Bruckner, MD;  Location: [facility];  Service: Cardiovascular;  Laterality: Left;   POSTERIOR CERVICAL FUSION/FORAMINOTOMY N/A 10/06/2021   Procedure: POSTERIOR CERVICAL FUSION/ FORAMINOTOMY LEVEL 3;  Surgeon: Clois Fret, MD;  Location: [facility];  Service: Neurosurgery;  Laterality: N/A;  will need monitoring   RADIOLOGY WITH ANESTHESIA Left 04/18/2021   Procedure: MRI SHOULDER WITHOUT CONTRAST WITH ANESTHESIA;  Surgeon: Radiologist, Medication, MD;  [facility];  Service: Radiology;  Laterality: Left;   RADIOLOGY WITH ANESTHESIA N/A 10/03/2021   Procedure: MRI CERVICAL SPINE WITH ANESTHESIA;  Surgeon: Radiologist, Medication, MD;  [facility];  Service: Radiology;  Laterality: N/A;   RADIOLOGY WITH ANESTHESIA N/A 01/30/2022   Procedure: MRI CERVICAL SPINE WITH AND WITOHOUT CONTRAST WITH ANESTHESIA;  Surgeon: Radiologist, Medication, MD;  [facility];  Service: Radiology;  Laterality: N/A;   RADIOLOGY WITH ANESTHESIA N/A 05/22/2022   Procedure: MRI LUMBER SPINE AND CERVIC SPINE WITH AND WITHOUT CONTRAST;  Surgeon: Radiologist, Medication, MD;  [facility];  Service: Radiology;  Laterality: N/A;   RADIOLOGY WITH ANESTHESIA N/A 12/25/2022   Procedure: MRI WITH ANESTHESIA OF CERVICAL SPINE WITH AND WITHOUT CONTRAST,CARDIAC MORPHOLOGY;  Surgeon: Radiologist, Medication, MD;  Location: [facility];  Service: Radiology;  Laterality: N/A;   RADIOLOGY WITH ANESTHESIA N/A 12/31/2023   Procedure: MRI CERVICAL SPINE WITH AND WITHOUT CONTRAST WITH ANESTHESIA;  Surgeon: Radiologist, Medication, MD;  [facility]: [facility];  Service: Radiology;  Laterality: N/A;   REVERSE SHOULDER ARTHROPLASTY Left    ROTATOR CUFF REPAIR  11/2010   Dr Cleotilde   SUPRAPUBIC CATHETER INSERTION  11/11/2022   TONSILLECTOMY     TOTAL HIP ARTHROPLASTY Right 02/24/2009    TOTAL HIP ARTHROPLASTY Left 06/2016   Dr Leora   Past Medical History:  Diagnosis Date  Allergy    Arthritis    osteoarthritis   Cancer (HCC)    prostate   CHF (congestive heart failure) (HCC)    GERD (gastroesophageal reflux disease)    History of kidney stones    passed   Hypertension    Hypothyroidism    Palpitations 2022   Paralysis (HCC)    paraplegia 09/2021-surgery or stroke   Pneumonia    12/22/28- > 25 years ago   Stroke [facility])    Mini stroke - Aug 2023   Thyroid  disease    Hyperthyroidism s/p radioactive iodine ablation   BP 123/75   Pulse 60   Ht 5' 8 (1.727 m)   SpO2 96%   BMI 23.57 kg/m   Opioid Risk Score:   Fall Risk Score:  `1  Depression screen [facility] 2/9     02/26/2024    2:48 PM 01/08/2024   11:24 AM 12/21/2023   10:42 AM 11/20/2023    2:03 PM 01/16/2023    1:09 PM 09/19/2022   12:55 PM 12/02/2021    1:20 PM  Depression screen PHQ 2/9  Decreased Interest 0 0 0 0 0 0 0  Down, Depressed, Hopeless 0 0 0 0 0 0 1  PHQ - 2 Score 0 0 0 0 0 0 1  Altered sleeping       0  Tired, decreased energy       1  Change in appetite       0  Feeling bad or failure about yourself        0  Trouble concentrating       0  Moving slowly or fidgety/restless       0  Suicidal thoughts       0  PHQ-9 Score       2      Data saved with a previous flowsheet row definition     Review of Systems  Musculoskeletal:  Positive for gait problem.       Bilateral shoulders  All other systems reviewed and are negative.      Objective:   Physical Exam  Awake, alert, word finding issues- chronic; accompanied by wife, in power w/c, NAD Neuro: MAS of 3 in RLE- MAS of 1+ in LLE- few beats of clonus RLE and none in RLE.      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   2. Let's add Senna M/W/F- and see if that helps his feeling- of feeling constipated.   3. Mucus is due to suppositories- is fine- sounds like not interested in magic bullet.    4. We discussed his fear of dying- I thinks it's the unknown that's so fear causing. Try to live your best life- and watch a lot of hockey and eat foods you love, etc   5.   F/U in 3 months double appt- SCI   I spent a total of  32    minutes on total care today- >50% coordination of care- due to  d/w pt pt about grief, and dying- and spasticity-  and bowels-   "

## 2024-02-29 NOTE — Written Directive (Addendum)
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 Babara   TREATMENT #:2   ADDITIONAL PHYSICIAN COMMENTS/NOTES: NO Labs today  AUTHORIZED USER SIGNATURE & TIME STAMP: Norleen GORMAN Boxer, MD   03/01/2024    8:10 AM   "

## 2024-03-01 ENCOUNTER — Encounter: Payer: Self-pay | Admitting: Oncology

## 2024-03-01 ENCOUNTER — Ambulatory Visit (HOSPITAL_COMMUNITY)
Admission: RE | Admit: 2024-03-01 | Discharge: 2024-03-01 | Disposition: A | Discharge status: Home / Self Care | Source: Ambulatory Visit | Attending: Oncology | Admitting: Oncology

## 2024-03-01 DIAGNOSIS — C61 Malignant neoplasm of prostate: Secondary | ICD-10-CM | POA: Diagnosis present

## 2024-03-01 DIAGNOSIS — C7940 Secondary malignant neoplasm of unspecified part of nervous system: Secondary | ICD-10-CM | POA: Diagnosis present

## 2024-03-01 MED ORDER — SODIUM CHLORIDE 0.9 % IV BOLUS: 1000.0000 mL | Freq: Once | INTRAVENOUS | Status: DC

## 2024-03-01 MED ORDER — LUTETIUM LU 177 VIPIVOTIDE TET 1000 MBQ/ML IV SOLN
200.0000 | Freq: Once | INTRAVENOUS | Status: AC
  Administered 2024-03-01: 205.9932 via INTRAVENOUS

## 2024-03-01 NOTE — Progress Notes (Signed)
 EXAM: NUCLEAR MEDICINE PLUVICTO  INJECTION 03/01/2024 01:14:10 PM TECHNIQUE: Patient presented to nuclear medicine for treatment of metastatic prostate cancer. PSMA PET scan was reviewed. The patient's most recent labs were reviewed, demonstrating no myelosuppression and normal renal function. PSA was mildly elevated to 36 from 34, which may represent a prostate cancer tumor flare response. Patient reports no adverse effects  It was determined to proceed with Lu-177 Pluvicto . Written informed consent was obtained from the patient by Dr. Malva. Written instructions for radiation safety were provided. The radiopharmaceutical was injected intravenously via LAC IV @ 1154 (over 2 minutes). Patient was given 1L of fluids along with treatment. Patient was educated on all radiation safety precautions and copies of forms along with a travel card were given. Patient was measured upon release, measuring approximately 0.59 mrem. Patient is okay to be released to the general public. Patient will return in 6 weeks for the next treatment. RADIOPHARMACEUTICAL: 794.00677 mCi Lu-177 PLUVICTO  Initial assay: 206 mCi. Residual assay: 0.00678 mCi. Dr. Malva signed a written directive for 200 mCi. COMPARISON: PSMA PET 12/11/23 CLINICAL HISTORY: prostate cancer metastatic to nervous system FINDINGS: Current Infusion: 794.00677 mCi Lu-177 PLUVICTO  given LAC IV @ 1154 (over 2 minutes). Planned Infusions: 6 (This is treatment #2). Patient's laboratory values were assessed. No myelosuppression. Normal renal function. PSA mildly elevated to 36 from 34, which may represent a prostate cancer tumor flare response. Patient was given 1L of fluids along with treatment.  Patient will return in 6 weeks for the next treatment. IMPRESSION: Treatment 2 of 6 planned Lu-177 Pluvicto  treatments. The patient tolerated the infusion well and will return in 6 weeks for the next planned infusion.

## 2024-03-07 ENCOUNTER — Ambulatory Visit: Admitting: Physician Assistant

## 2024-03-07 DIAGNOSIS — Z435 Encounter for attention to cystostomy: Secondary | ICD-10-CM

## 2024-03-07 DIAGNOSIS — R339 Retention of urine, unspecified: Secondary | ICD-10-CM

## 2024-03-07 DIAGNOSIS — R3989 Other symptoms and signs involving the genitourinary system: Secondary | ICD-10-CM

## 2024-03-07 NOTE — Progress Notes (Signed)
 Suprapubic Cath Change  Patient is present today for a suprapubic catheter change due to urinary retention.  8ml of water was drained from the balloon, a 16FR foley cath was removed from the tract with out difficulty.  Site was cleaned and prepped in a sterile fashion with betadine.  A 16FR coude foley cath was replaced into the tract no complications were noted. Urine return was noted, 10 ml of sterile water was inflated into the balloon and a night bag was attached for drainage.  Patient tolerated well.     Performed by: Hansini Clodfelter, PA-C   Additional Notes: He is concerned for possible UTI.  He notes some increased spasticity in his legs over the past several days as well as penile discomfort.  Urine sample obtained from new catheter today for culture, will treat as indicated.  Follow up: Return in about 4 weeks (around 04/04/2024) for SPT exchange.

## 2024-03-11 LAB — CULTURE, URINE COMPREHENSIVE

## 2024-03-14 ENCOUNTER — Ambulatory Visit: Payer: Self-pay | Admitting: Physician Assistant

## 2024-03-14 DIAGNOSIS — R895 Abnormal microbiological findings in specimens from other organs, systems and tissues: Secondary | ICD-10-CM

## 2024-03-15 MED ORDER — SULFAMETHOXAZOLE-TRIMETHOPRIM 800-160 MG PO TABS: 1.0000 | ORAL_TABLET | Freq: Two times a day (BID) | ORAL | 0 refills | Status: AC

## 2024-03-31 ENCOUNTER — Ambulatory Visit: Admitting: Medical

## 2024-04-04 ENCOUNTER — Inpatient Hospital Stay: Attending: Anatomic Pathology & Clinical Pathology

## 2024-04-04 ENCOUNTER — Inpatient Hospital Stay: Admitting: Oncology

## 2024-04-04 ENCOUNTER — Ambulatory Visit: Admitting: Physician Assistant

## 2024-04-05 ENCOUNTER — Inpatient Hospital Stay: Admitting: Oncology

## 2024-04-05 ENCOUNTER — Inpatient Hospital Stay

## 2024-04-07 ENCOUNTER — Ambulatory Visit: Admitting: Medical

## 2024-04-12 ENCOUNTER — Inpatient Hospital Stay (HOSPITAL_COMMUNITY): Admission: RE | Admit: 2024-04-12 | Source: Ambulatory Visit

## 2024-05-02 ENCOUNTER — Ambulatory Visit: Admitting: Physician Assistant

## 2024-06-10 ENCOUNTER — Encounter: Admitting: Physical Medicine and Rehabilitation
